# Patient Record
Sex: Male | Born: 1965
Health system: Southern US, Community
[De-identification: ages and names within clinical notes are randomized; demographics above are authoritative.]

## PROBLEM LIST (undated history)

## (undated) DIAGNOSIS — I2510 Atherosclerotic heart disease of native coronary artery without angina pectoris: Secondary | ICD-10-CM

## (undated) DIAGNOSIS — R0600 Dyspnea, unspecified: Secondary | ICD-10-CM

## (undated) DIAGNOSIS — R310 Gross hematuria: Secondary | ICD-10-CM

## (undated) DIAGNOSIS — I480 Paroxysmal atrial fibrillation: Secondary | ICD-10-CM

## (undated) DIAGNOSIS — I7101 Dissection of ascending aorta: Secondary | ICD-10-CM

## (undated) DIAGNOSIS — I10 Essential (primary) hypertension: Secondary | ICD-10-CM

## (undated) DIAGNOSIS — K8020 Calculus of gallbladder without cholecystitis without obstruction: Secondary | ICD-10-CM

## (undated) DIAGNOSIS — E559 Vitamin D deficiency, unspecified: Secondary | ICD-10-CM

## (undated) DIAGNOSIS — Z952 Presence of prosthetic heart valve: Secondary | ICD-10-CM

## (undated) DIAGNOSIS — E785 Hyperlipidemia, unspecified: Secondary | ICD-10-CM

## (undated) DIAGNOSIS — E669 Obesity, unspecified: Secondary | ICD-10-CM

## (undated) DIAGNOSIS — Z87442 Personal history of urinary calculi: Secondary | ICD-10-CM

## (undated) DIAGNOSIS — I639 Cerebral infarction, unspecified: Secondary | ICD-10-CM

## (undated) DIAGNOSIS — Z8679 Personal history of other diseases of the circulatory system: Secondary | ICD-10-CM

## (undated) DIAGNOSIS — I5032 Chronic diastolic (congestive) heart failure: Secondary | ICD-10-CM

## (undated) HISTORY — DX: Chronic diastolic (congestive) heart failure: I50.32

## (undated) HISTORY — DX: Personal history of urinary calculi: Z87.442

## (undated) HISTORY — DX: Personal history of other diseases of the circulatory system: Z86.79

## (undated) HISTORY — DX: Gross hematuria: R31.0

## (undated) HISTORY — DX: Vitamin D deficiency, unspecified: E55.9

## (undated) HISTORY — DX: Atherosclerotic heart disease of native coronary artery without angina pectoris: I25.10

## (undated) HISTORY — DX: Calculus of gallbladder without cholecystitis without obstruction: K80.20

---

## 1996-05-23 HISTORY — PX: APPENDECTOMY: SHX54

## 1998-05-23 HISTORY — PX: CORONARY ARTERY BYPASS GRAFT: SHX141

## 1998-05-23 HISTORY — PX: AORTIC VALVE REPLACEMENT: SHX41

## 2006-01-16 ENCOUNTER — Ambulatory Visit: Payer: Self-pay | Admitting: Internal Medicine

## 2006-01-18 ENCOUNTER — Ambulatory Visit: Payer: Self-pay | Admitting: Cardiology

## 2006-01-19 ENCOUNTER — Ambulatory Visit: Payer: Self-pay | Admitting: Internal Medicine

## 2006-01-27 ENCOUNTER — Ambulatory Visit: Payer: Self-pay | Admitting: Cardiology

## 2006-02-03 ENCOUNTER — Ambulatory Visit: Payer: Self-pay | Admitting: Cardiology

## 2006-02-09 ENCOUNTER — Ambulatory Visit: Payer: Self-pay | Admitting: Cardiology

## 2006-02-10 ENCOUNTER — Ambulatory Visit: Payer: Self-pay

## 2006-02-10 ENCOUNTER — Encounter: Payer: Self-pay | Admitting: Cardiology

## 2006-02-17 ENCOUNTER — Ambulatory Visit: Payer: Self-pay | Admitting: Internal Medicine

## 2006-02-23 ENCOUNTER — Ambulatory Visit: Payer: Self-pay | Admitting: Cardiology

## 2006-03-02 ENCOUNTER — Ambulatory Visit: Payer: Self-pay | Admitting: Cardiology

## 2006-03-23 ENCOUNTER — Ambulatory Visit: Payer: Self-pay | Admitting: Cardiology

## 2006-04-12 ENCOUNTER — Ambulatory Visit: Payer: Self-pay | Admitting: Cardiology

## 2006-05-12 ENCOUNTER — Ambulatory Visit: Payer: Self-pay | Admitting: Internal Medicine

## 2006-05-12 ENCOUNTER — Ambulatory Visit: Payer: Self-pay | Admitting: Cardiovascular Disease

## 2006-06-09 ENCOUNTER — Ambulatory Visit: Payer: Self-pay | Admitting: Internal Medicine

## 2006-06-15 ENCOUNTER — Ambulatory Visit: Payer: Self-pay | Admitting: Cardiology

## 2006-06-29 ENCOUNTER — Ambulatory Visit: Payer: Self-pay | Admitting: Cardiology

## 2006-06-29 LAB — CONVERTED CEMR LAB
INR: 5.5 (ref 0.9–2.0)
Prothrombin Time: 30.4 s (ref 10.0–14.0)

## 2006-07-07 ENCOUNTER — Ambulatory Visit: Payer: Self-pay | Admitting: Cardiology

## 2006-07-28 ENCOUNTER — Ambulatory Visit: Payer: Self-pay | Admitting: Cardiology

## 2006-07-28 LAB — CONVERTED CEMR LAB
INR: 4.8 — ABNORMAL HIGH (ref 0.9–2.0)
Prothrombin Time: 28.5 s — ABNORMAL HIGH (ref 10.0–14.0)

## 2006-08-14 ENCOUNTER — Ambulatory Visit: Payer: Self-pay | Admitting: Internal Medicine

## 2006-08-14 LAB — CONVERTED CEMR LAB
INR: 6.2 (ref 0.9–2.0)
Prothrombin Time: 32.3 s (ref 10.0–14.0)

## 2006-08-24 ENCOUNTER — Ambulatory Visit: Payer: Self-pay | Admitting: Cardiology

## 2006-08-31 ENCOUNTER — Ambulatory Visit: Payer: Self-pay | Admitting: Internal Medicine

## 2006-09-26 ENCOUNTER — Ambulatory Visit: Payer: Self-pay | Admitting: Internal Medicine

## 2006-10-03 ENCOUNTER — Ambulatory Visit: Payer: Self-pay | Admitting: Cardiology

## 2006-10-13 ENCOUNTER — Ambulatory Visit: Payer: Self-pay | Admitting: Internal Medicine

## 2006-11-03 ENCOUNTER — Ambulatory Visit: Payer: Self-pay | Admitting: Cardiology

## 2006-11-09 ENCOUNTER — Ambulatory Visit: Payer: Self-pay | Admitting: Internal Medicine

## 2006-11-21 ENCOUNTER — Ambulatory Visit: Payer: Self-pay | Admitting: Cardiology

## 2006-12-07 ENCOUNTER — Emergency Department (HOSPITAL_COMMUNITY): Admission: EM | Admit: 2006-12-07 | Discharge: 2006-12-07 | Payer: Self-pay | Admitting: Emergency Medicine

## 2006-12-07 IMAGING — CR DG CHEST 1V PORT
1 series · 1 of 1 positions shown · non-contrast
Comparison: None

CLINICAL DATA: Swelling of the left leg. Diabetes. CVA. Hypertension.

PORTABLE CHEST - 1 VIEW

[view not recorded]
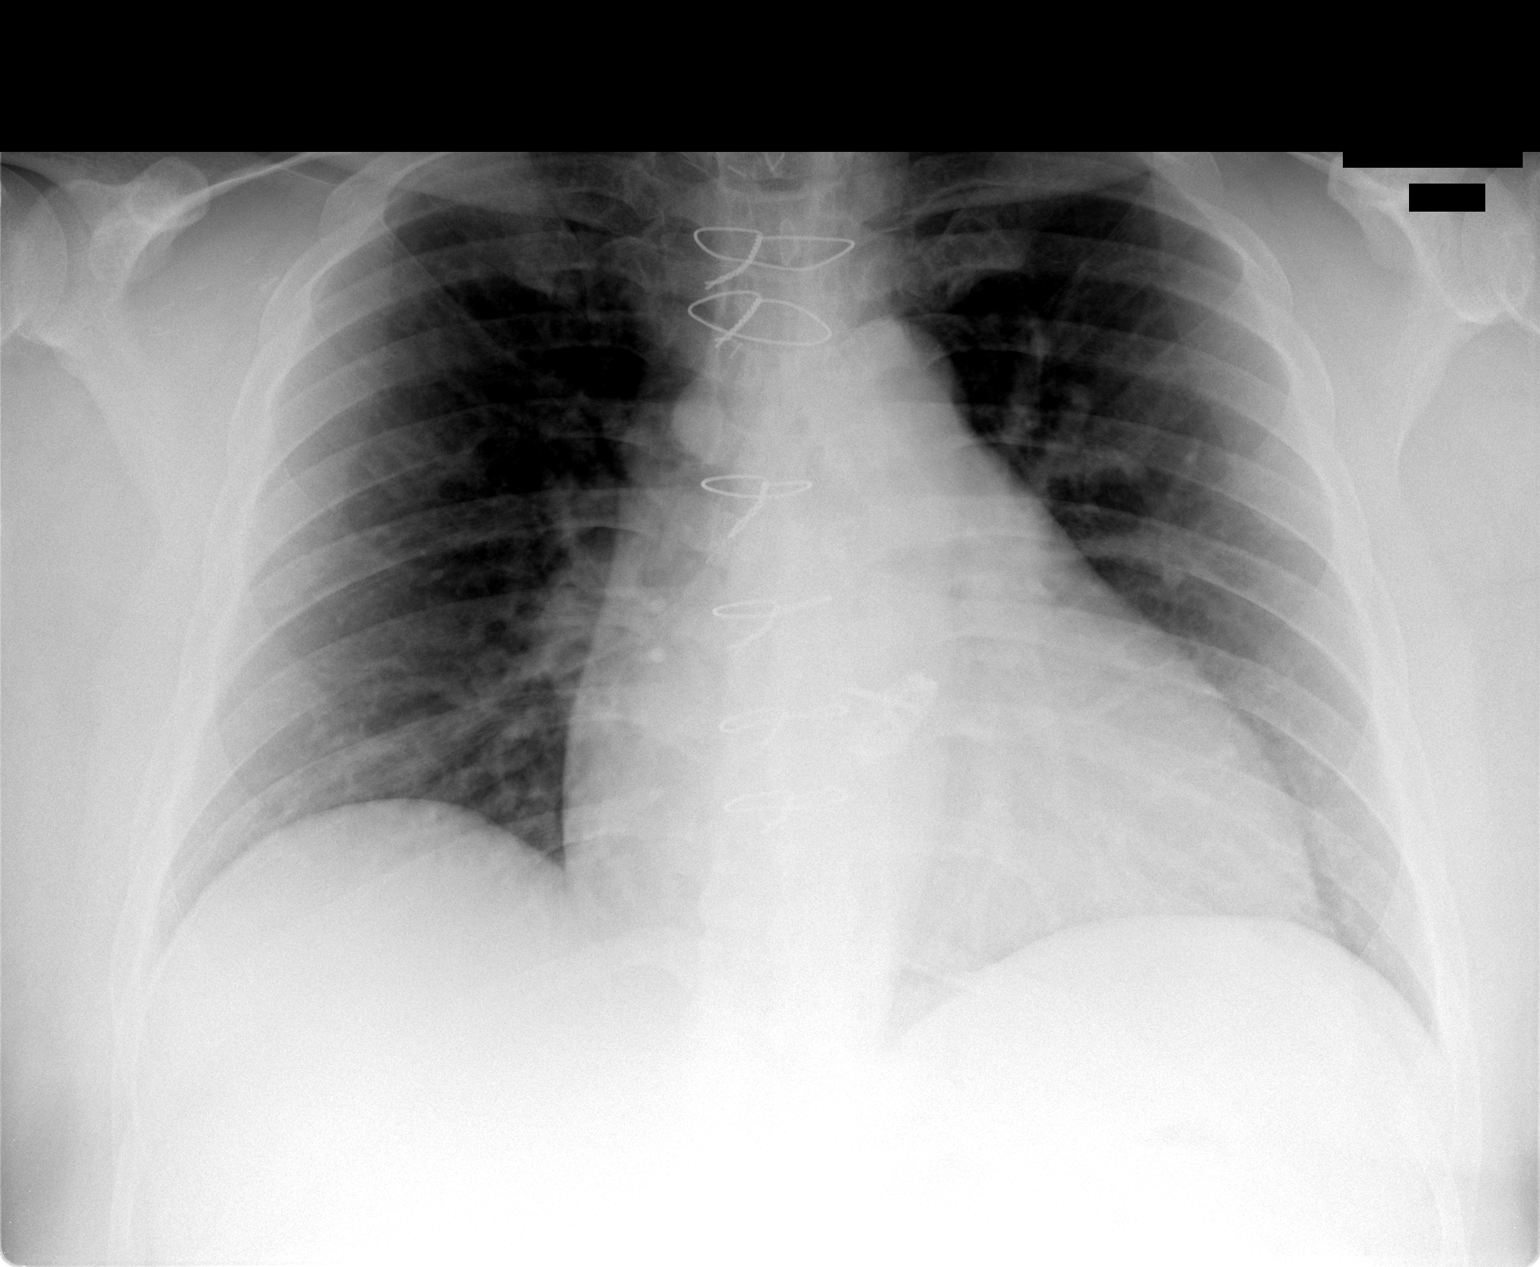

[1 of 1 positions shown; findings below may reference images not displayed]

FINDINGS: Mild cardiomegaly is present. Prior median sternotomy noted. Mild
prominence of upper zone vasculature raises the possibility of pulmonary venous
hypertension, but no overt pulmonary edema is identified. No airspace opacities
noted.  

IMPRESSION

Mild cardiomegaly and pulmonary venous hypertension, without overt edema.

## 2006-12-11 ENCOUNTER — Ambulatory Visit: Payer: Self-pay | Admitting: Cardiovascular Disease

## 2007-01-06 ENCOUNTER — Ambulatory Visit: Payer: Self-pay | Admitting: Internal Medicine

## 2007-01-10 ENCOUNTER — Inpatient Hospital Stay (HOSPITAL_COMMUNITY): Admission: EM | Admit: 2007-01-10 | Discharge: 2007-01-12 | Payer: Self-pay | Admitting: Emergency Medicine

## 2007-01-15 ENCOUNTER — Ambulatory Visit: Payer: Self-pay | Admitting: Internal Medicine

## 2007-01-15 LAB — CONVERTED CEMR LAB
BUN: 14 mg/dL (ref 6–23)
CO2: 30 meq/L (ref 19–32)
Calcium: 9.2 mg/dL (ref 8.4–10.5)
Chloride: 105 meq/L (ref 96–112)
Creatinine, Ser: 1 mg/dL (ref 0.4–1.5)
GFR calc Af Amer: 106 mL/min
GFR calc non Af Amer: 88 mL/min
Glucose, Bld: 232 mg/dL — ABNORMAL HIGH (ref 70–99)
Potassium: 4 meq/L (ref 3.5–5.1)
Sodium: 141 meq/L (ref 135–145)

## 2007-01-25 ENCOUNTER — Ambulatory Visit: Payer: Self-pay | Admitting: Cardiology

## 2007-02-01 ENCOUNTER — Ambulatory Visit: Payer: Self-pay | Admitting: Internal Medicine

## 2007-02-20 ENCOUNTER — Ambulatory Visit: Payer: Self-pay | Admitting: Internal Medicine

## 2007-02-20 LAB — CONVERTED CEMR LAB
ALT: 20 units/L (ref 0–53)
AST: 24 units/L (ref 0–37)
BUN: 16 mg/dL (ref 6–23)
CO2: 32 meq/L (ref 19–32)
Calcium: 9 mg/dL (ref 8.4–10.5)
Chloride: 104 meq/L (ref 96–112)
Creatinine, Ser: 1.1 mg/dL (ref 0.4–1.5)
Creatinine,U: 148.7 mg/dL
GFR calc Af Amer: 94 mL/min
GFR calc non Af Amer: 78 mL/min
Glucose, Bld: 97 mg/dL (ref 70–99)
Hgb A1c MFr Bld: 8.9 % — ABNORMAL HIGH (ref 4.6–6.0)
Microalb Creat Ratio: 8.1 mg/g (ref 0.0–30.0)
Microalb, Ur: 1.2 mg/dL (ref 0.0–1.9)
Potassium: 3.7 meq/L (ref 3.5–5.1)
Sodium: 140 meq/L (ref 135–145)

## 2007-02-22 ENCOUNTER — Ambulatory Visit: Payer: Self-pay | Admitting: Cardiology

## 2007-03-08 ENCOUNTER — Ambulatory Visit: Payer: Self-pay | Admitting: Cardiology

## 2007-03-26 ENCOUNTER — Ambulatory Visit: Payer: Self-pay | Admitting: Cardiovascular Disease

## 2007-03-26 ENCOUNTER — Encounter: Payer: Self-pay | Admitting: Internal Medicine

## 2007-04-23 ENCOUNTER — Ambulatory Visit: Payer: Self-pay | Admitting: Cardiology

## 2007-05-07 ENCOUNTER — Ambulatory Visit: Payer: Self-pay

## 2007-05-07 ENCOUNTER — Ambulatory Visit: Payer: Self-pay | Admitting: Internal Medicine

## 2007-05-07 ENCOUNTER — Encounter: Payer: Self-pay | Admitting: Internal Medicine

## 2007-05-21 DIAGNOSIS — Z87442 Personal history of urinary calculi: Secondary | ICD-10-CM | POA: Insufficient documentation

## 2007-05-21 DIAGNOSIS — I10 Essential (primary) hypertension: Secondary | ICD-10-CM | POA: Insufficient documentation

## 2007-05-21 DIAGNOSIS — G459 Transient cerebral ischemic attack, unspecified: Secondary | ICD-10-CM | POA: Insufficient documentation

## 2007-05-21 DIAGNOSIS — E119 Type 2 diabetes mellitus without complications: Secondary | ICD-10-CM | POA: Insufficient documentation

## 2007-05-22 ENCOUNTER — Ambulatory Visit: Payer: Self-pay | Admitting: Internal Medicine

## 2007-05-25 ENCOUNTER — Encounter (INDEPENDENT_AMBULATORY_CARE_PROVIDER_SITE_OTHER): Payer: Self-pay | Admitting: *Deleted

## 2007-05-25 LAB — CONVERTED CEMR LAB
BUN: 9 mg/dL (ref 6–23)
CO2: 29 meq/L (ref 19–32)
Calcium: 9.4 mg/dL (ref 8.4–10.5)
Chloride: 102 meq/L (ref 96–112)
Creatinine, Ser: 1 mg/dL (ref 0.4–1.5)
Creatinine,U: 183.8 mg/dL
GFR calc Af Amer: 105 mL/min
GFR calc non Af Amer: 87 mL/min
Glucose, Bld: 201 mg/dL — ABNORMAL HIGH (ref 70–99)
Hgb A1c MFr Bld: 6.1 % — ABNORMAL HIGH (ref 4.6–6.0)
Microalb Creat Ratio: 114.8 mg/g — ABNORMAL HIGH (ref 0.0–30.0)
Microalb, Ur: 21.1 mg/dL — ABNORMAL HIGH (ref 0.0–1.9)
Potassium: 3.3 meq/L — ABNORMAL LOW (ref 3.5–5.1)
Sodium: 140 meq/L (ref 135–145)

## 2007-06-04 ENCOUNTER — Ambulatory Visit: Payer: Self-pay | Admitting: Cardiology

## 2007-06-25 ENCOUNTER — Ambulatory Visit: Payer: Self-pay | Admitting: Internal Medicine

## 2007-07-03 ENCOUNTER — Encounter: Payer: Self-pay | Admitting: Internal Medicine

## 2007-07-09 ENCOUNTER — Ambulatory Visit: Payer: Self-pay | Admitting: Cardiovascular Disease

## 2007-07-27 ENCOUNTER — Ambulatory Visit: Payer: Self-pay | Admitting: Cardiology

## 2007-08-17 ENCOUNTER — Ambulatory Visit: Payer: Self-pay | Admitting: Internal Medicine

## 2007-08-20 ENCOUNTER — Ambulatory Visit: Payer: Self-pay | Admitting: Internal Medicine

## 2007-08-20 DIAGNOSIS — E785 Hyperlipidemia, unspecified: Secondary | ICD-10-CM | POA: Insufficient documentation

## 2007-08-20 LAB — CONVERTED CEMR LAB
ALT: 13 units/L (ref 0–53)
AST: 20 units/L (ref 0–37)
BUN: 12 mg/dL (ref 6–23)
CO2: 32 meq/L (ref 19–32)
Calcium: 9.2 mg/dL (ref 8.4–10.5)
Chloride: 104 meq/L (ref 96–112)
Cholesterol: 106 mg/dL (ref 0–200)
Creatinine, Ser: 1 mg/dL (ref 0.4–1.5)
Creatinine,U: 227.2 mg/dL
GFR calc Af Amer: 105 mL/min
GFR calc non Af Amer: 87 mL/min
Glucose, Bld: 134 mg/dL — ABNORMAL HIGH (ref 70–99)
HDL: 25.2 mg/dL — ABNORMAL LOW (ref >39.0)
Hgb A1c MFr Bld: 6.4 % — ABNORMAL HIGH (ref 4.6–6.0)
LDL Cholesterol: 53 mg/dL (ref 0–99)
Microalb Creat Ratio: 46.7 mg/g — ABNORMAL HIGH (ref 0.0–30.0)
Microalb, Ur: 10.6 mg/dL — ABNORMAL HIGH (ref 0.0–1.9)
Potassium: 3.8 meq/L (ref 3.5–5.1)
Sodium: 141 meq/L (ref 135–145)
Total CHOL/HDL Ratio: 4.2
Triglycerides: 139 mg/dL (ref 0–149)
VLDL: 28 mg/dL (ref 0–40)

## 2007-09-05 ENCOUNTER — Ambulatory Visit: Payer: Self-pay | Admitting: Internal Medicine

## 2007-09-24 ENCOUNTER — Encounter: Payer: Self-pay | Admitting: Internal Medicine

## 2007-10-03 ENCOUNTER — Ambulatory Visit: Payer: Self-pay | Admitting: Internal Medicine

## 2007-10-18 ENCOUNTER — Ambulatory Visit: Payer: Self-pay | Admitting: Cardiology

## 2007-11-15 ENCOUNTER — Ambulatory Visit: Payer: Self-pay | Admitting: Internal Medicine

## 2007-11-15 ENCOUNTER — Encounter: Payer: Self-pay | Admitting: Internal Medicine

## 2007-11-22 ENCOUNTER — Ambulatory Visit: Payer: Self-pay | Admitting: Internal Medicine

## 2007-12-13 ENCOUNTER — Ambulatory Visit: Payer: Self-pay | Admitting: Cardiology

## 2007-12-26 ENCOUNTER — Ambulatory Visit: Payer: Self-pay | Admitting: Internal Medicine

## 2007-12-26 DIAGNOSIS — R5381 Other malaise: Secondary | ICD-10-CM | POA: Insufficient documentation

## 2007-12-26 DIAGNOSIS — R5383 Other fatigue: Secondary | ICD-10-CM

## 2007-12-27 LAB — CONVERTED CEMR LAB
ALT: 18 units/L (ref 0–53)
AST: 22 units/L (ref 0–37)
Albumin: 4 g/dL (ref 3.5–5.2)
Alkaline Phosphatase: 60 units/L (ref 39–117)
BUN: 14 mg/dL (ref 6–23)
Basophils Absolute: 0 10*3/uL (ref 0.0–0.1)
Basophils Relative: 0.4 % (ref 0.0–3.0)
Bilirubin, Direct: 0.2 mg/dL (ref 0.0–0.3)
CO2: 30 meq/L (ref 19–32)
Calcium: 9.6 mg/dL (ref 8.4–10.5)
Chloride: 102 meq/L (ref 96–112)
Cholesterol: 123 mg/dL (ref 0–200)
Creatinine, Ser: 1.1 mg/dL (ref 0.4–1.5)
Creatinine,U: 230.5 mg/dL
Eosinophils Absolute: 0.3 10*3/uL (ref 0.0–0.7)
Eosinophils Relative: 6.1 % — ABNORMAL HIGH (ref 0.0–5.0)
GFR calc Af Amer: 94 mL/min
GFR calc non Af Amer: 78 mL/min
Glucose, Bld: 208 mg/dL — ABNORMAL HIGH (ref 70–99)
HCT: 46.1 % (ref 39.0–52.0)
HDL: 29.4 mg/dL — ABNORMAL LOW (ref >39.0)
Hemoglobin: 15.8 g/dL (ref 13.0–17.0)
Hgb A1c MFr Bld: 8 % — ABNORMAL HIGH (ref 4.6–6.0)
LDL Cholesterol: 61 mg/dL (ref 0–99)
Lymphocytes Relative: 24.5 % (ref 12.0–46.0)
MCHC: 34.3 g/dL (ref 30.0–36.0)
MCV: 84.3 fL (ref 78.0–100.0)
Microalb, Ur: 139.4 mg/dL — ABNORMAL HIGH (ref 0.0–1.9)
Monocytes Absolute: 0.6 10*3/uL (ref 0.1–1.0)
Monocytes Relative: 10.7 % (ref 3.0–12.0)
Neutro Abs: 3.4 10*3/uL (ref 1.4–7.7)
Neutrophils Relative %: 58.3 % (ref 43.0–77.0)
PSA: 0.58 ng/mL (ref 0.10–4.00)
Platelets: 152 10*3/uL (ref 150–400)
Potassium: 3.8 meq/L (ref 3.5–5.1)
RBC: 5.46 M/uL (ref 4.22–5.81)
RDW: 14.4 % (ref 11.5–14.6)
Sodium: 139 meq/L (ref 135–145)
TSH: 4.08 microintl units/mL (ref 0.35–5.50)
Total Bilirubin: 1.3 mg/dL — ABNORMAL HIGH (ref 0.3–1.2)
Total CHOL/HDL Ratio: 4.2
Total Protein: 8.5 g/dL — ABNORMAL HIGH (ref 6.0–8.3)
Triglycerides: 165 mg/dL — ABNORMAL HIGH (ref 0–149)
VLDL: 33 mg/dL (ref 0–40)
WBC: 5.7 10*3/uL (ref 4.5–10.5)

## 2007-12-28 LAB — CONVERTED CEMR LAB: Vit D, 1,25-Dihydroxy: 23 — ABNORMAL LOW (ref 30–89)

## 2008-01-10 ENCOUNTER — Ambulatory Visit: Payer: Self-pay | Admitting: Cardiology

## 2008-02-07 ENCOUNTER — Ambulatory Visit: Payer: Self-pay | Admitting: Cardiology

## 2008-03-06 ENCOUNTER — Ambulatory Visit: Payer: Self-pay | Admitting: Cardiology

## 2008-04-03 ENCOUNTER — Ambulatory Visit: Payer: Self-pay | Admitting: Cardiology

## 2008-05-02 ENCOUNTER — Ambulatory Visit: Payer: Self-pay | Admitting: Cardiology

## 2008-05-27 ENCOUNTER — Ambulatory Visit: Payer: Self-pay

## 2008-05-27 ENCOUNTER — Encounter: Payer: Self-pay | Admitting: Cardiovascular Disease

## 2008-05-27 ENCOUNTER — Ambulatory Visit: Payer: Self-pay | Admitting: Internal Medicine

## 2008-06-17 ENCOUNTER — Ambulatory Visit: Payer: Self-pay | Admitting: Cardiology

## 2008-06-30 ENCOUNTER — Encounter: Payer: Self-pay | Admitting: Internal Medicine

## 2008-07-03 ENCOUNTER — Ambulatory Visit: Payer: Self-pay | Admitting: Cardiovascular Disease

## 2008-07-04 ENCOUNTER — Emergency Department (HOSPITAL_COMMUNITY): Admission: EM | Admit: 2008-07-04 | Discharge: 2008-07-04 | Payer: Self-pay | Admitting: Emergency Medicine

## 2008-07-05 ENCOUNTER — Emergency Department (HOSPITAL_COMMUNITY): Admission: EM | Admit: 2008-07-05 | Discharge: 2008-07-05 | Payer: Self-pay | Admitting: Emergency Medicine

## 2008-07-08 ENCOUNTER — Ambulatory Visit: Payer: Self-pay | Admitting: Cardiology

## 2008-07-14 ENCOUNTER — Ambulatory Visit: Payer: Self-pay | Admitting: Cardiology

## 2008-07-28 ENCOUNTER — Ambulatory Visit: Payer: Self-pay | Admitting: Cardiology

## 2008-08-07 ENCOUNTER — Ambulatory Visit: Payer: Self-pay | Admitting: Internal Medicine

## 2008-08-07 DIAGNOSIS — R310 Gross hematuria: Secondary | ICD-10-CM | POA: Insufficient documentation

## 2008-08-07 DIAGNOSIS — E559 Vitamin D deficiency, unspecified: Secondary | ICD-10-CM | POA: Insufficient documentation

## 2008-08-08 LAB — CONVERTED CEMR LAB
BUN: 14 mg/dL (ref 6–23)
Bilirubin Urine: NEGATIVE
CO2: 32 meq/L (ref 19–32)
Calcium: 9.1 mg/dL (ref 8.4–10.5)
Chloride: 101 meq/L (ref 96–112)
Cholesterol: 117 mg/dL (ref 0–200)
Creatinine, Ser: 0.9 mg/dL (ref 0.4–1.5)
GFR calc non Af Amer: 118.17 mL/min (ref >60)
Glucose, Bld: 140 mg/dL — ABNORMAL HIGH (ref 70–99)
HDL: 30.5 mg/dL — ABNORMAL LOW (ref >39.00)
Hgb A1c MFr Bld: 7.1 % — ABNORMAL HIGH (ref 4.6–6.5)
Ketones, ur: NEGATIVE mg/dL
LDL Cholesterol: 62 mg/dL (ref 0–99)
Leukocytes, UA: NEGATIVE
Nitrite: NEGATIVE
Potassium: 3.4 meq/L — ABNORMAL LOW (ref 3.5–5.1)
Sodium: 139 meq/L (ref 135–145)
Specific Gravity, Urine: >=1.03 (ref 1.000–1.030)
Total CHOL/HDL Ratio: 4
Total Protein, Urine: >=300 mg/dL
Triglycerides: 122 mg/dL (ref 0.0–149.0)
Urine Glucose: NEGATIVE mg/dL
Urobilinogen, UA: 1 (ref 0.0–1.0)
VLDL: 24.4 mg/dL (ref 0.0–40.0)
pH: 5.5 (ref 5.0–8.0)

## 2008-08-13 ENCOUNTER — Ambulatory Visit: Payer: Self-pay | Admitting: Internal Medicine

## 2008-08-13 ENCOUNTER — Telehealth (INDEPENDENT_AMBULATORY_CARE_PROVIDER_SITE_OTHER): Payer: Self-pay | Admitting: *Deleted

## 2008-08-13 IMAGING — CT CT ABDOMEN W/O CM
2 of 4 series · 17 of 46 positions shown, 19 images · non-contrast
Comparison: None available.

CT ABDOMEN

CLINICAL DATA: Gross hematuria.  Low back pain.  History of
stones.

CT ABDOMEN AND PELVIS WITHOUT CONTRAST
TECHNIQUE: Multidetector CT imaging of the abdomen and pelvis was
performed following the standard protocol without intravenous
contrast.

[Series 2: xl stone wo · axial · 0.98mm/px · z∈[-507,-39]mm · 14 of 129 slices shown, 16 images]
[im 6/129  soft-tissue]
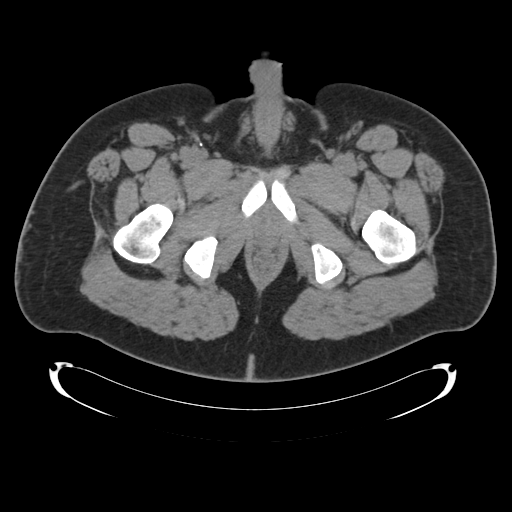
[im 6/129  bone]
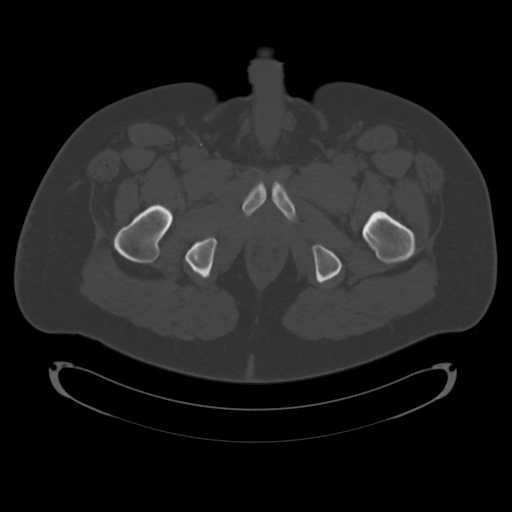
[im 17/129  soft-tissue]
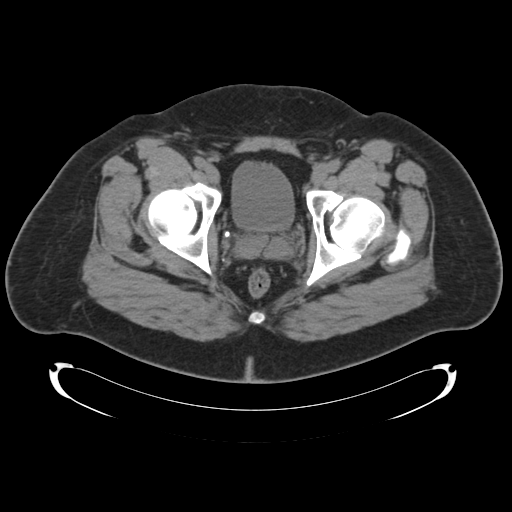
[im 27/129  soft-tissue]
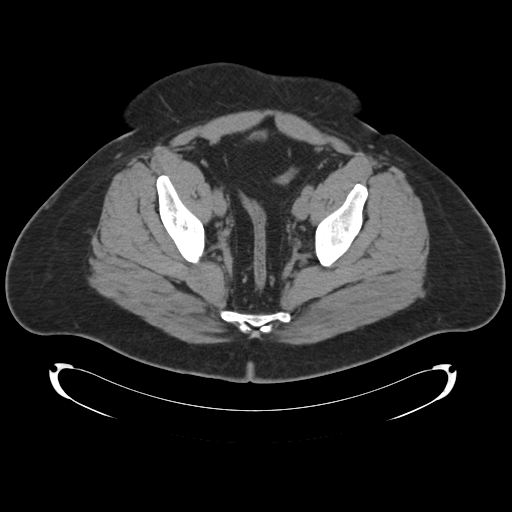
[im 33/129  soft-tissue]
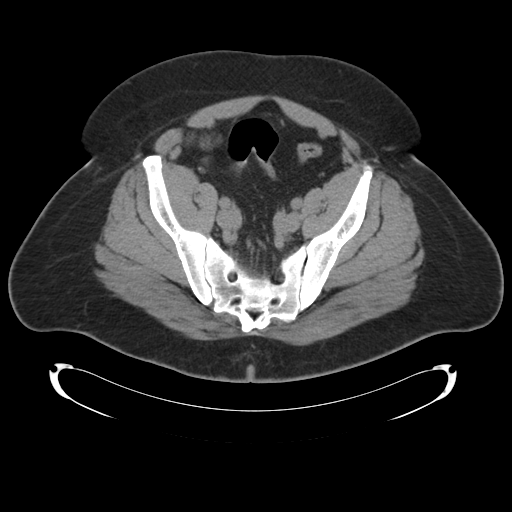
[im 43/129  soft-tissue]
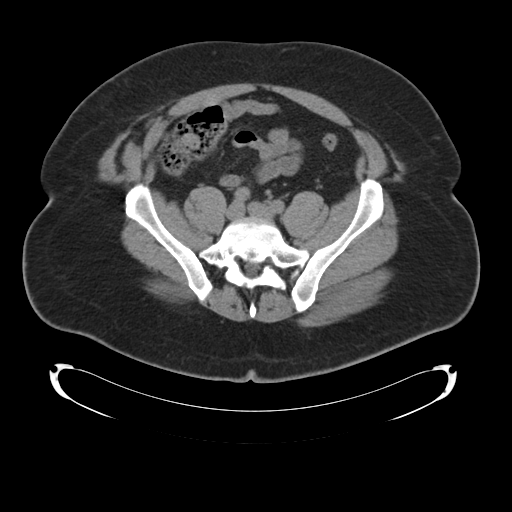
[im 54/129  soft-tissue]
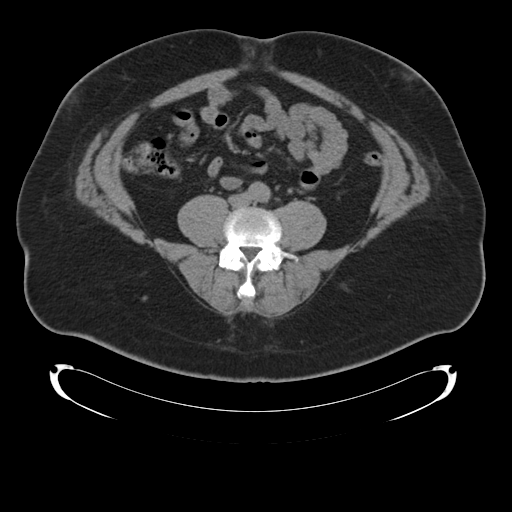
[im 59/129  soft-tissue]
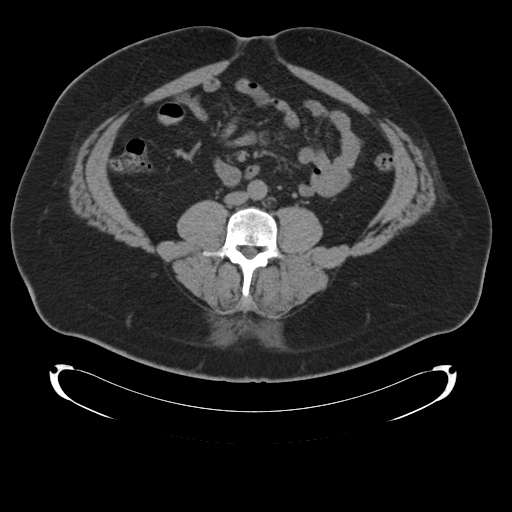
[im 70/129  soft-tissue]
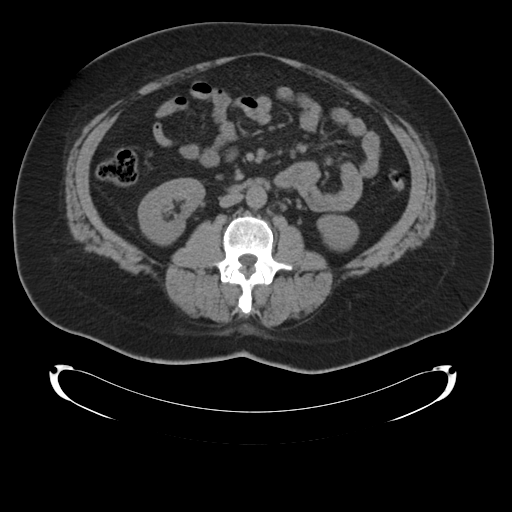
[im 75/129  soft-tissue]
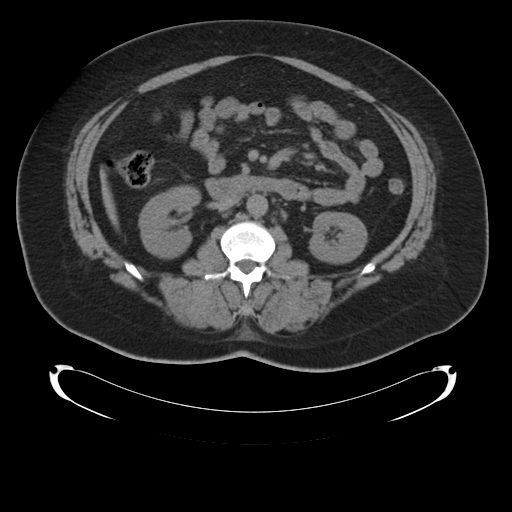
[im 75/129  bone]
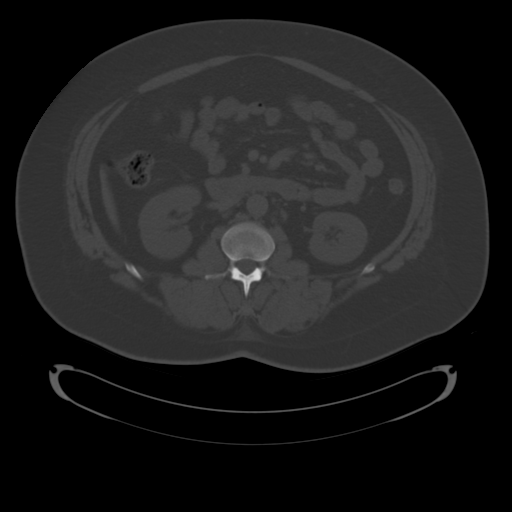
[im 86/129  soft-tissue]
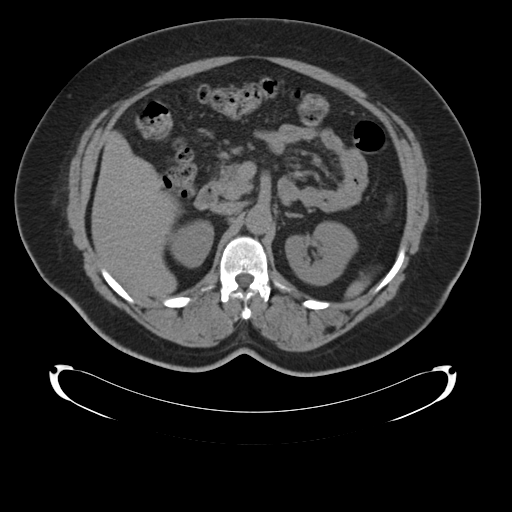
[im 97/129  soft-tissue]
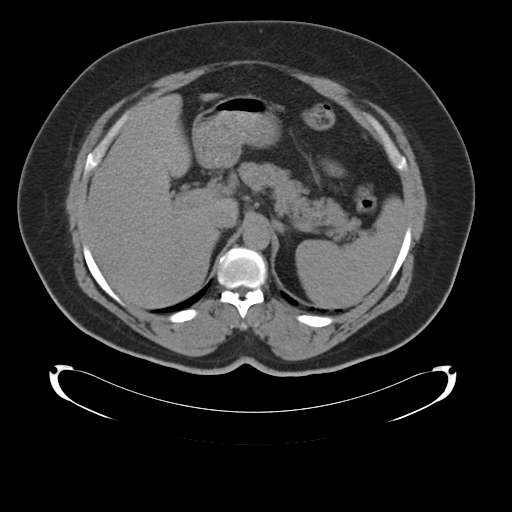
[im 102/129  soft-tissue]
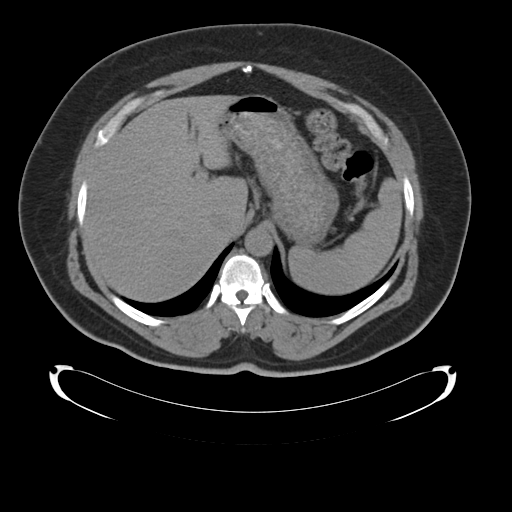
[im 113/129  soft-tissue]
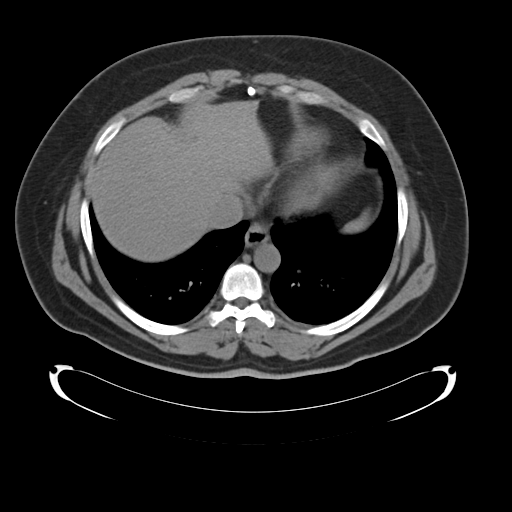
[im 123/129  soft-tissue]
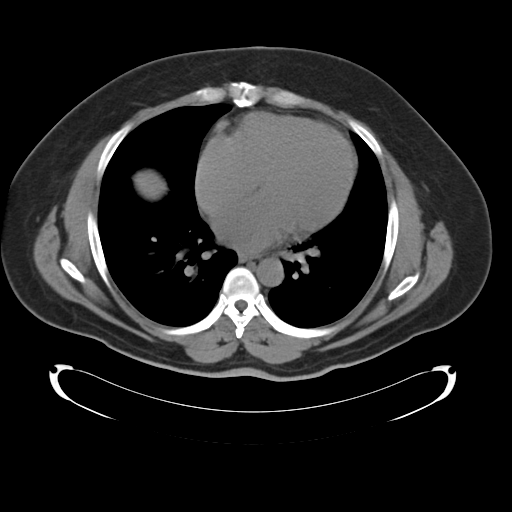

[Series 602: <mpr range> · coronal · 1.05mm/px · 3 of 170 slices shown]
[im 57/170  soft-tissue]
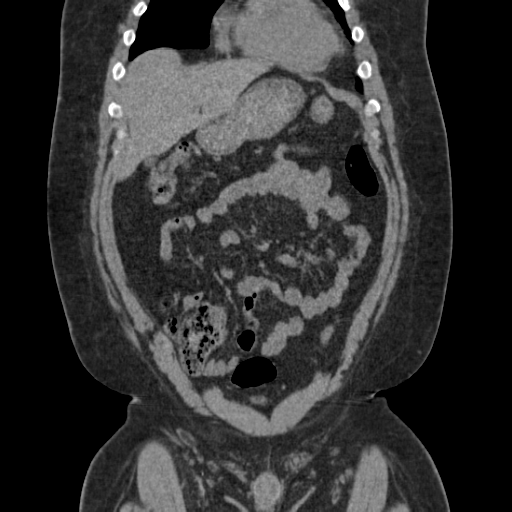
[im 76/170  soft-tissue]
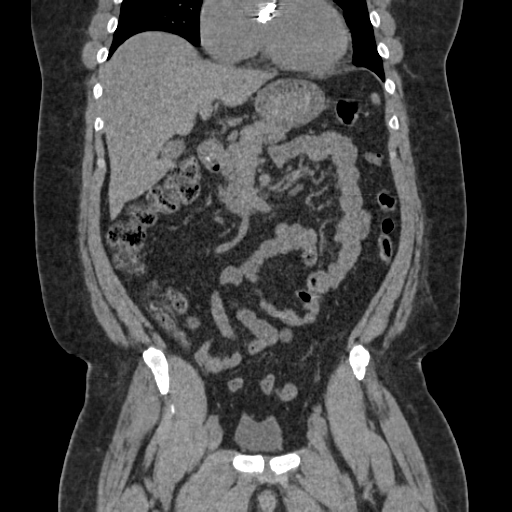
[im 94/170  soft-tissue]
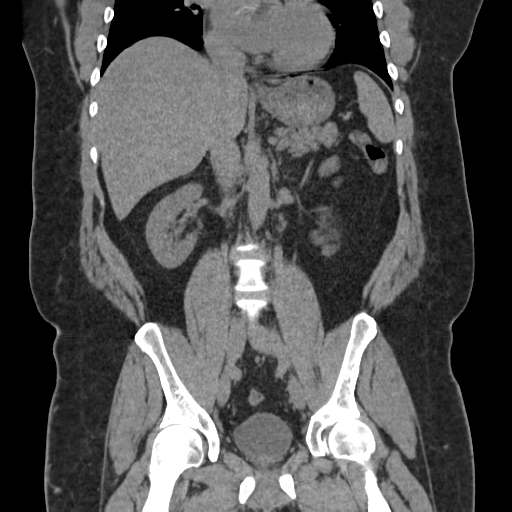

[17 of 46 positions shown; findings below may reference images not displayed]

FINDINGS: Note is made of a prosthetic aortic valve.  The heart is
mildly enlarged.  There is no significant pleural or pericardial
effusion.  The lung bases are clear.

The spleen and liver are unremarkable.  The stomach, pancreas,
common bile duct and gallbladder are normal.  The adrenal glands
are within normal limits.  There is no evidence for
nephrolithiasis.  There is no hydronephrosis.  There is no
significant abdominal lymphadenopathy or free fluid.  Small bowel
is unremarkable. Bone windows are unremarkable.
IMPRESSION: 1.  No acute abnormality of the abdomen.
2.  No evidence for nephrolithiasis.
3.  Status post aortic valve replacement.
4.  Borderline cardiomegaly.

CT PELVIS
FINDINGS: The rectosigmoid colon is within normal limits.  The
remainder the colon is unremarkable.  The appendix is not clearly
visualized.  The terminal ileum is unremarkable.  The urinary
bladder is within normal limits.  Prostate gland is normal.  Bone
windows are unremarkable.
IMPRESSION: No acute abnormality of the pelvis.

## 2008-08-19 ENCOUNTER — Encounter: Payer: Self-pay | Admitting: Internal Medicine

## 2008-08-19 ENCOUNTER — Encounter: Admission: RE | Admit: 2008-08-19 | Discharge: 2008-08-19 | Payer: Self-pay | Admitting: Internal Medicine

## 2008-08-27 ENCOUNTER — Ambulatory Visit: Payer: Self-pay | Admitting: Cardiology

## 2008-08-27 LAB — CONVERTED CEMR LAB
ALT: 16 units/L (ref 0–53)
AST: 23 units/L (ref 0–37)
Albumin: 3.1 g/dL — ABNORMAL LOW (ref 3.5–5.2)
Alkaline Phosphatase: 45 units/L (ref 39–117)
Bilirubin, Direct: 0.2 mg/dL (ref 0.0–0.3)
Total Bilirubin: 1 mg/dL (ref 0.3–1.2)
Total Protein: 7.6 g/dL (ref 6.0–8.3)

## 2008-08-29 ENCOUNTER — Encounter: Payer: Self-pay | Admitting: Internal Medicine

## 2008-09-10 ENCOUNTER — Ambulatory Visit: Payer: Self-pay | Admitting: Internal Medicine

## 2008-09-25 ENCOUNTER — Ambulatory Visit: Payer: Self-pay | Admitting: Internal Medicine

## 2008-10-21 ENCOUNTER — Encounter: Payer: Self-pay | Admitting: *Deleted

## 2008-10-23 ENCOUNTER — Ambulatory Visit: Payer: Self-pay | Admitting: Internal Medicine

## 2008-10-23 LAB — CONVERTED CEMR LAB
POC INR: 1.6
Protime: 15.6

## 2008-11-06 ENCOUNTER — Ambulatory Visit: Payer: Self-pay | Admitting: Internal Medicine

## 2008-11-06 LAB — CONVERTED CEMR LAB
POC INR: 1.7
Protime: 15.9

## 2008-11-20 ENCOUNTER — Ambulatory Visit: Payer: Self-pay | Admitting: Cardiology

## 2008-11-20 LAB — CONVERTED CEMR LAB
POC INR: 2
Prothrombin Time: 17.5 s

## 2008-11-26 ENCOUNTER — Encounter: Payer: Self-pay | Admitting: *Deleted

## 2008-11-27 ENCOUNTER — Telehealth: Payer: Self-pay | Admitting: Internal Medicine

## 2008-12-04 ENCOUNTER — Ambulatory Visit: Payer: Self-pay | Admitting: Cardiology

## 2009-01-01 ENCOUNTER — Ambulatory Visit: Payer: Self-pay | Admitting: Internal Medicine

## 2009-01-01 ENCOUNTER — Encounter: Payer: Self-pay | Admitting: Internal Medicine

## 2009-01-01 LAB — CONVERTED CEMR LAB: POC INR: 2.7

## 2009-01-29 ENCOUNTER — Ambulatory Visit: Payer: Self-pay | Admitting: Cardiovascular Disease

## 2009-01-29 LAB — CONVERTED CEMR LAB: POC INR: 3.2

## 2009-02-10 ENCOUNTER — Telehealth: Payer: Self-pay | Admitting: Internal Medicine

## 2009-02-19 ENCOUNTER — Ambulatory Visit: Payer: Self-pay | Admitting: Cardiology

## 2009-03-12 ENCOUNTER — Ambulatory Visit: Payer: Self-pay | Admitting: Cardiovascular Disease

## 2009-03-12 LAB — CONVERTED CEMR LAB: POC INR: 2.8

## 2009-03-25 ENCOUNTER — Ambulatory Visit: Payer: Self-pay | Admitting: Internal Medicine

## 2009-03-25 LAB — CONVERTED CEMR LAB
BUN: 15 mg/dL (ref 6–23)
Cholesterol: 125 mg/dL (ref 0–200)
GFR calc non Af Amer: 93.47 mL/min (ref >60)
HDL: 31.2 mg/dL — ABNORMAL LOW (ref >39.00)
Potassium: 3.5 meq/L (ref 3.5–5.1)
Sodium: 141 meq/L (ref 135–145)
Triglycerides: 142 mg/dL (ref 0.0–149.0)
VLDL: 28.4 mg/dL (ref 0.0–40.0)

## 2009-04-09 ENCOUNTER — Ambulatory Visit: Payer: Self-pay | Admitting: Cardiology

## 2009-04-09 LAB — CONVERTED CEMR LAB: POC INR: 4.2

## 2009-04-21 ENCOUNTER — Ambulatory Visit: Payer: Self-pay | Admitting: Cardiology

## 2009-05-05 ENCOUNTER — Ambulatory Visit: Payer: Self-pay | Admitting: Cardiology

## 2009-05-05 LAB — CONVERTED CEMR LAB: INR: 2.5

## 2009-06-04 ENCOUNTER — Ambulatory Visit: Payer: Self-pay | Admitting: Internal Medicine

## 2009-06-04 LAB — CONVERTED CEMR LAB: POC INR: 2.7

## 2009-07-02 ENCOUNTER — Ambulatory Visit: Payer: Self-pay | Admitting: Cardiology

## 2009-07-02 LAB — CONVERTED CEMR LAB: POC INR: 3.1

## 2009-07-30 ENCOUNTER — Ambulatory Visit: Payer: Self-pay | Admitting: Cardiology

## 2009-08-27 ENCOUNTER — Ambulatory Visit: Payer: Self-pay | Admitting: Cardiovascular Disease

## 2009-08-27 LAB — CONVERTED CEMR LAB: POC INR: 3.2

## 2009-09-17 ENCOUNTER — Ambulatory Visit: Payer: Self-pay | Admitting: Cardiology

## 2009-09-30 ENCOUNTER — Ambulatory Visit: Payer: Self-pay | Admitting: Internal Medicine

## 2009-09-30 LAB — CONVERTED CEMR LAB
AST: 30 units/L (ref 0–37)
Albumin: 3.6 g/dL (ref 3.5–5.2)
Basophils Absolute: 0 10*3/uL (ref 0.0–0.1)
Basophils Relative: 0.4 % (ref 0.0–3.0)
CO2: 28 meq/L (ref 19–32)
Chloride: 106 meq/L (ref 96–112)
Cholesterol: 146 mg/dL (ref 0–200)
Eosinophils Absolute: 0.5 10*3/uL (ref 0.0–0.7)
Glucose, Bld: 126 mg/dL — ABNORMAL HIGH (ref 70–99)
HCT: 39.5 % (ref 39.0–52.0)
HDL: 37.8 mg/dL — ABNORMAL LOW (ref >39.00)
Hemoglobin: 13.6 g/dL (ref 13.0–17.0)
Hgb A1c MFr Bld: 7.2 % — ABNORMAL HIGH (ref 4.6–6.5)
Iron: 53 ug/dL (ref 42–165)
Ketones, ur: NEGATIVE mg/dL
Leukocytes, UA: NEGATIVE
Lymphs Abs: 1.3 10*3/uL (ref 0.7–4.0)
MCHC: 34.5 g/dL (ref 30.0–36.0)
MCV: 85.1 fL (ref 78.0–100.0)
Microalb Creat Ratio: 5.7 mg/g (ref 0.0–30.0)
Neutro Abs: 3.3 10*3/uL (ref 1.4–7.7)
PSA: 0.74 ng/mL (ref 0.10–4.00)
Potassium: 3.7 meq/L (ref 3.5–5.1)
RDW: 17.2 % — ABNORMAL HIGH (ref 11.5–14.6)
Saturation Ratios: 17 % — ABNORMAL LOW (ref 20.0–50.0)
Sodium: 141 meq/L (ref 135–145)
Specific Gravity, Urine: 1.025 (ref 1.000–1.030)
Total Protein: 7.3 g/dL (ref 6.0–8.3)
Transferrin: 222.3 mg/dL (ref 212.0–360.0)
Urobilinogen, UA: 1 (ref 0.0–1.0)
VLDL: 20.2 mg/dL (ref 0.0–40.0)
pH: 5.5 (ref 5.0–8.0)

## 2009-10-14 ENCOUNTER — Ambulatory Visit: Payer: Self-pay | Admitting: Internal Medicine

## 2009-10-14 LAB — CONVERTED CEMR LAB: POC INR: 2.4

## 2009-10-16 ENCOUNTER — Ambulatory Visit: Payer: Self-pay | Admitting: Internal Medicine

## 2009-10-16 ENCOUNTER — Encounter: Payer: Self-pay | Admitting: Internal Medicine

## 2009-10-16 ENCOUNTER — Ambulatory Visit (HOSPITAL_COMMUNITY): Admission: RE | Admit: 2009-10-16 | Discharge: 2009-10-16 | Payer: Self-pay | Admitting: Internal Medicine

## 2009-10-16 ENCOUNTER — Ambulatory Visit: Payer: Self-pay

## 2009-11-11 ENCOUNTER — Ambulatory Visit: Payer: Self-pay | Admitting: Cardiovascular Disease

## 2009-11-11 LAB — CONVERTED CEMR LAB: POC INR: 1.4

## 2009-11-26 ENCOUNTER — Ambulatory Visit: Payer: Self-pay | Admitting: Internal Medicine

## 2009-11-26 LAB — CONVERTED CEMR LAB: POC INR: 1.8

## 2009-12-10 ENCOUNTER — Ambulatory Visit: Payer: Self-pay | Admitting: Cardiology

## 2009-12-10 LAB — CONVERTED CEMR LAB: POC INR: 2.3

## 2009-12-17 ENCOUNTER — Ambulatory Visit: Payer: Self-pay | Admitting: Internal Medicine

## 2009-12-31 ENCOUNTER — Ambulatory Visit: Payer: Self-pay | Admitting: Internal Medicine

## 2009-12-31 ENCOUNTER — Inpatient Hospital Stay (HOSPITAL_COMMUNITY): Admission: EM | Admit: 2009-12-31 | Discharge: 2010-01-01 | Payer: Self-pay | Admitting: Emergency Medicine

## 2009-12-31 IMAGING — CR DG CHEST 2V
2 series · 2 of 2 positions shown · non-contrast
Comparison: 12/07/2006.

CLINICAL DATA: Chest pain for 1 day.  Diabetes.  Hypertension.
Left-sided pain.  Shortness of breath.

CHEST - 2 VIEW

[w chest pa *]
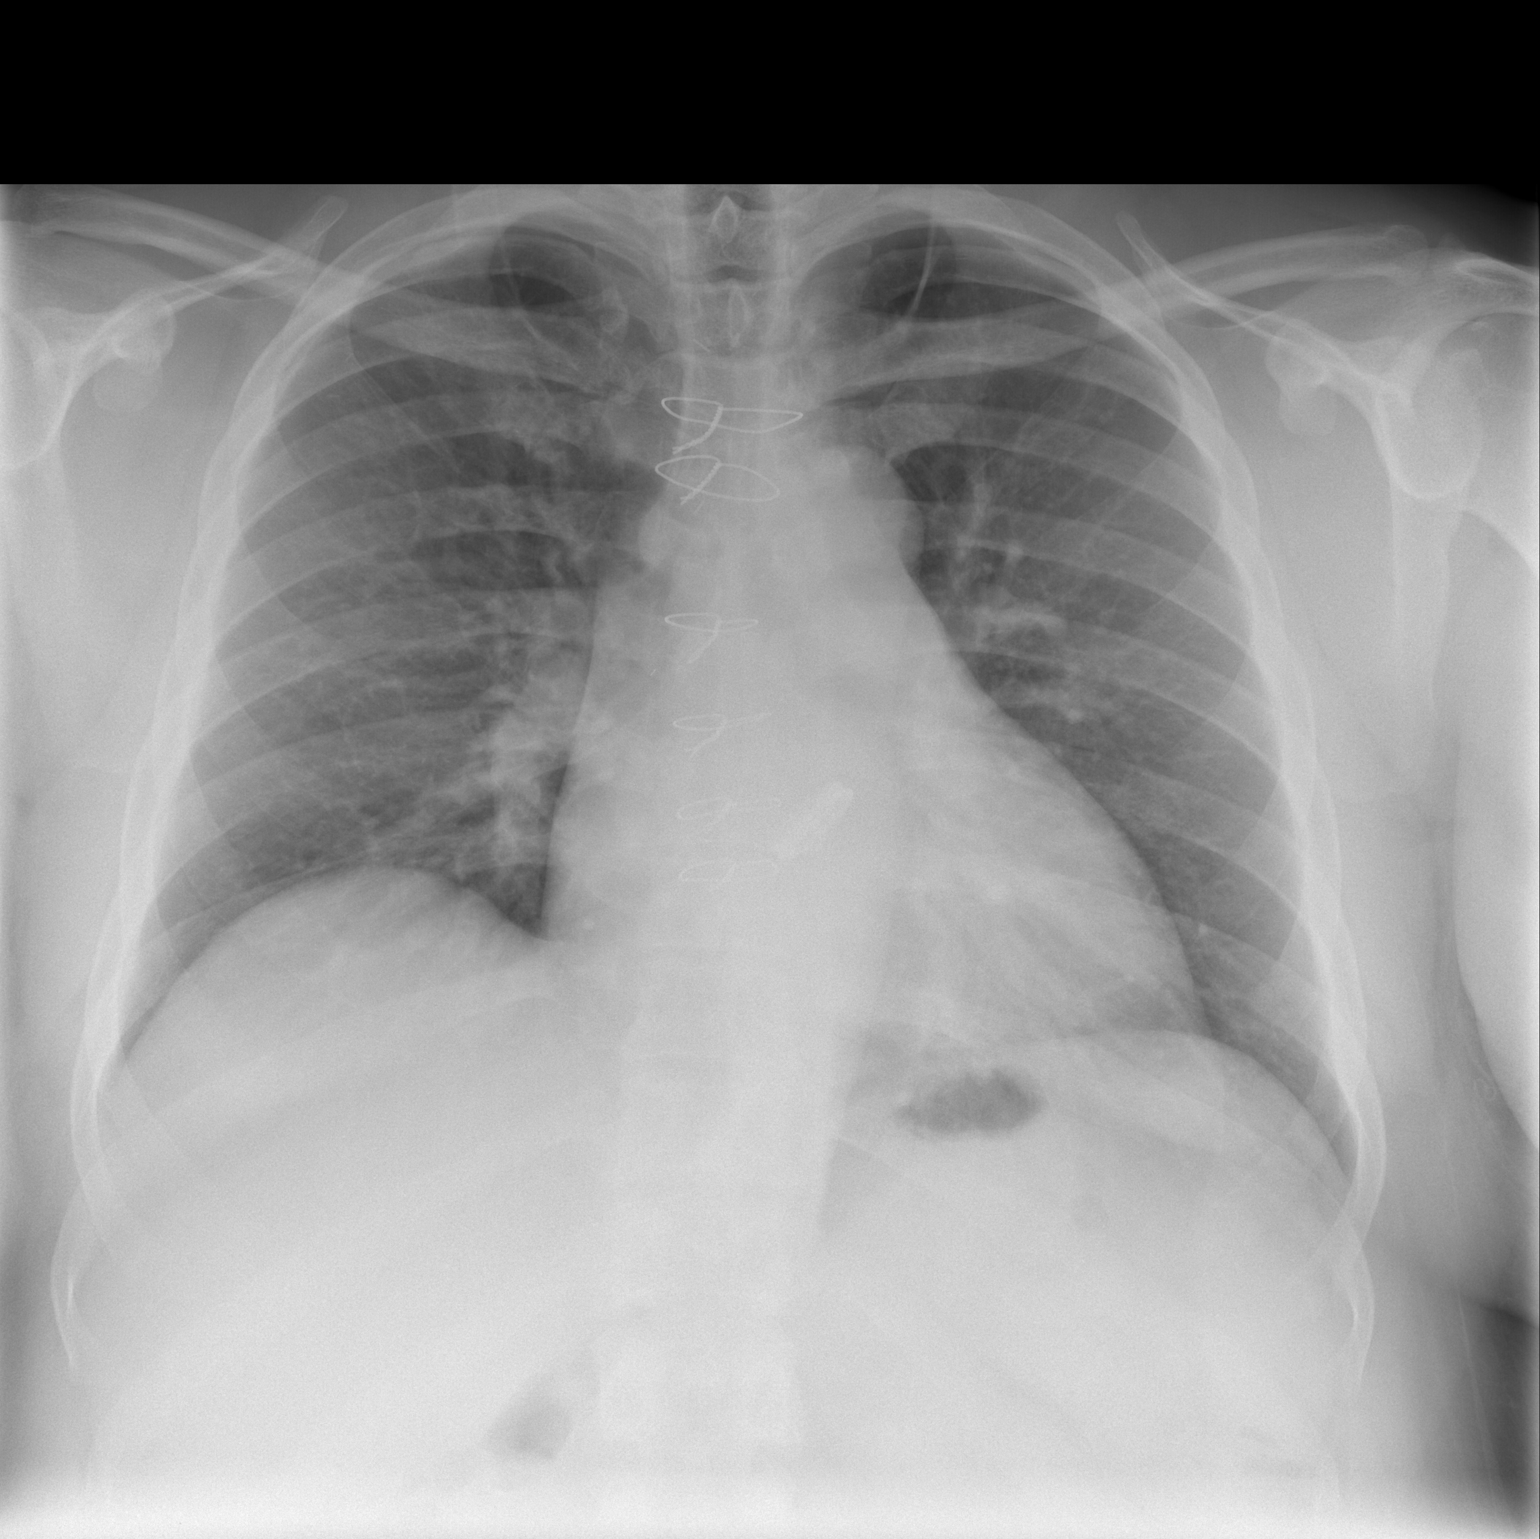

[w chest lat *]
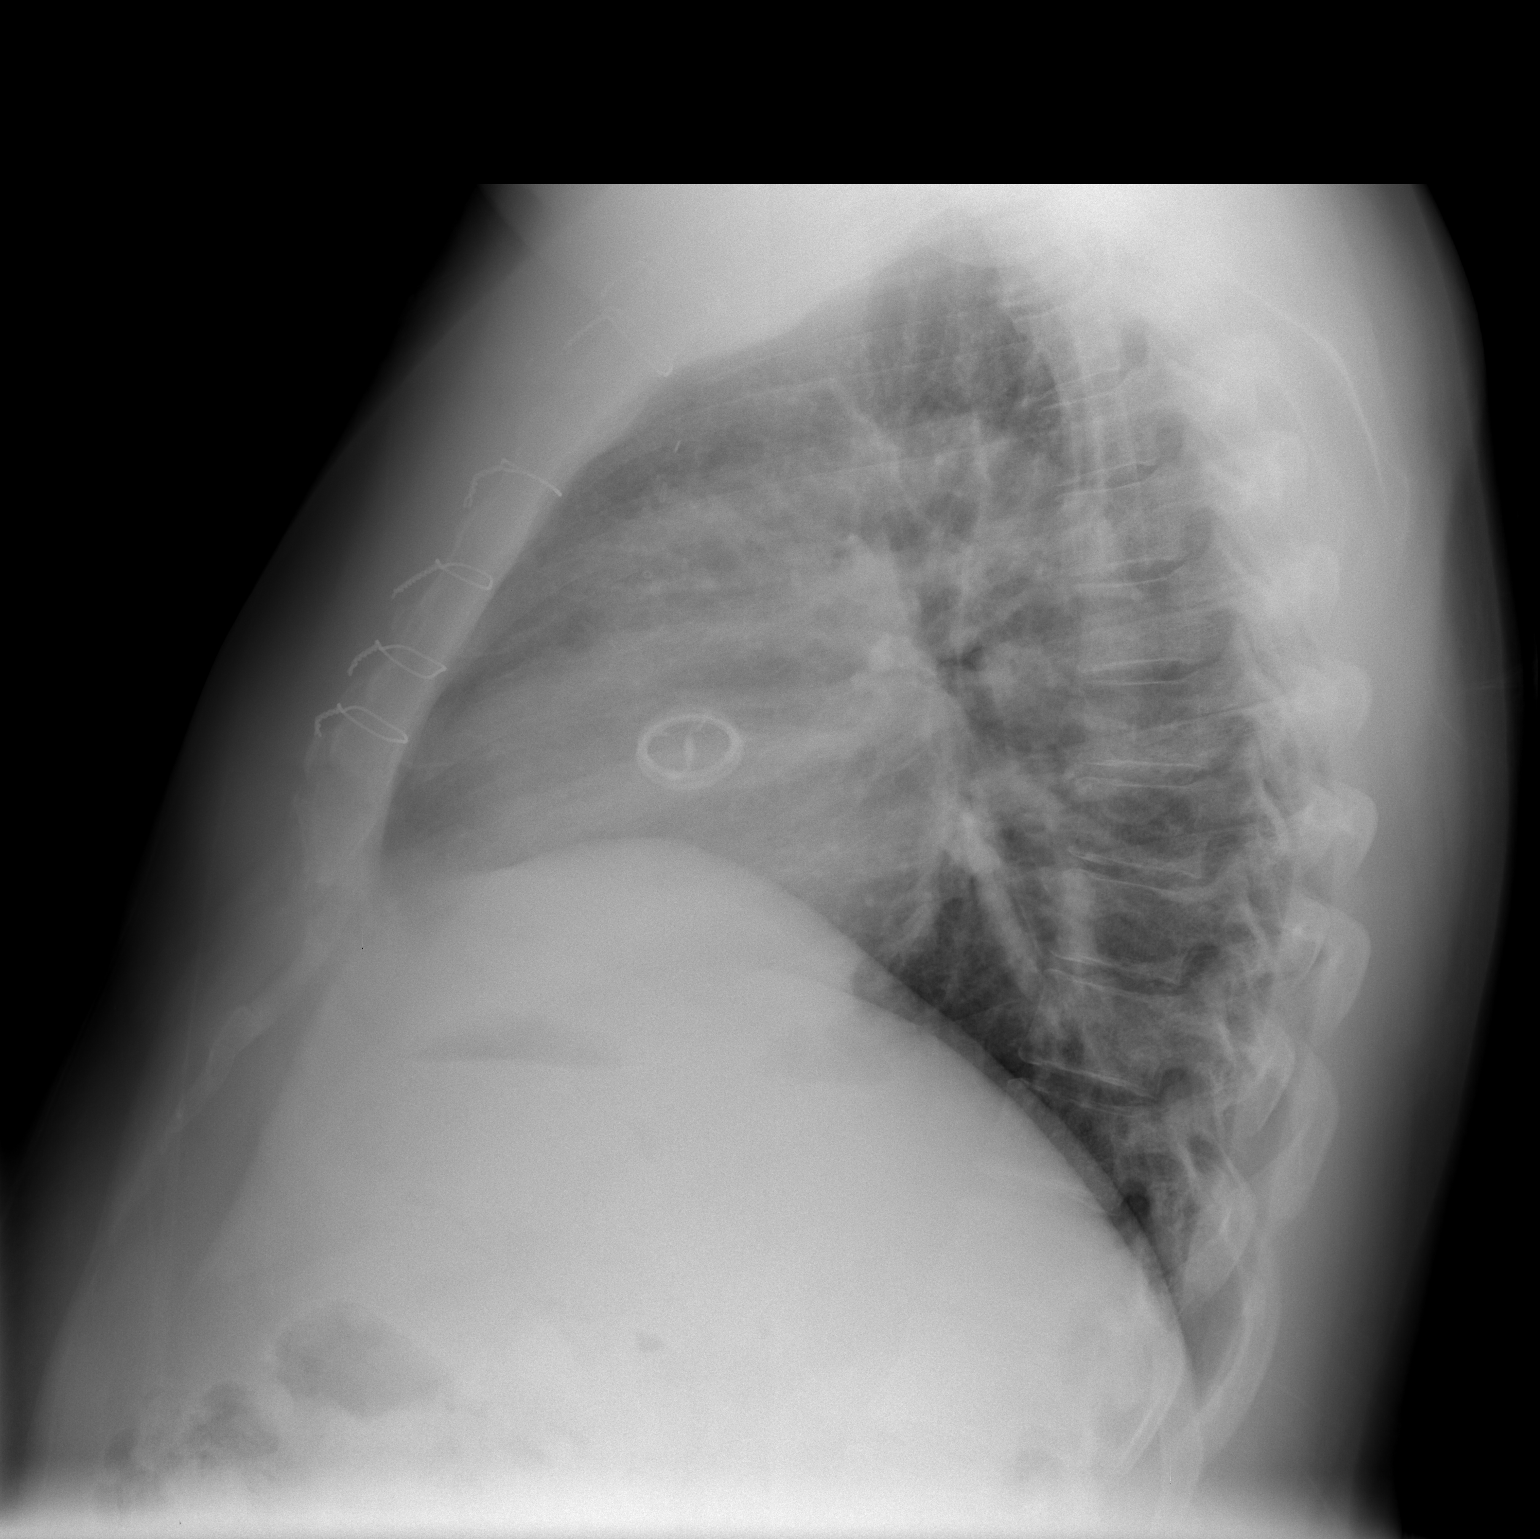

[2 of 2 positions shown; findings below may reference images not displayed]

FINDINGS: Cardiac density is borderline in size.  It appears
slightly smaller than on the previous study.  Previous median
sternotomy and valvular replacement has been performed. The lungs
are well aerated and free of infiltrates. No pleural abnormality is
evident. There is been chronic elevation of the anterior aspect of
the right hemidiaphragm. Bones appear average for age.
IMPRESSION: Borderline cardiac size with no evidence of pulmonary edema,
pneumonia, or pleural effusion.  Stable chronic findings are
described above.

## 2009-12-31 IMAGING — CT CT ANGIO CHEST
1 of 2 series · 19 of 32 positions shown · IV contrast (APPLIED)
Comparison: Chest x-ray in 12/31/2009

CLINICAL DATA: Chest pain.

CT ANGIOGRAPHY CHEST WITH CONTRAST
TECHNIQUE: Multidetector CT imaging of the chest was performed
using the standard protocol during bolus administration of
intravenous contrast.  Multiplanar CT image reconstructions
including MIPs were obtained to evaluate the vascular anatomy.
Contrast:  100 ml Lmnipaque-5YY

[Series 5: pe thins @ 1mm · axial · 0.70mm/px · z∈[+1206,+1466]mm · 19 of 286 slices shown]
[im 13/286  lung]
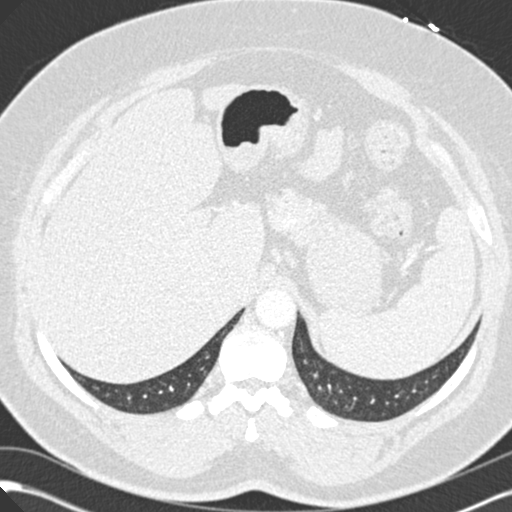
[im 25/286  soft-tissue]
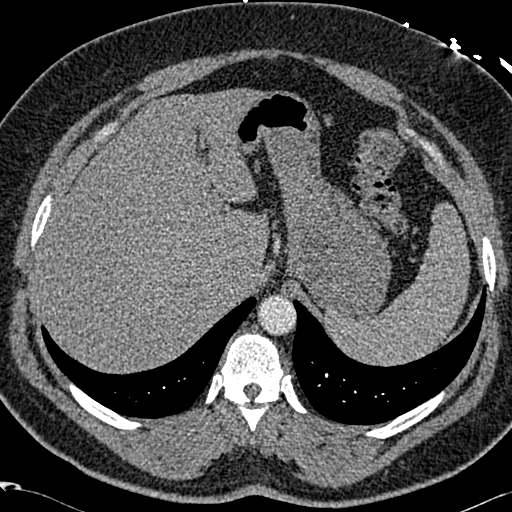
[im 38/286  lung]
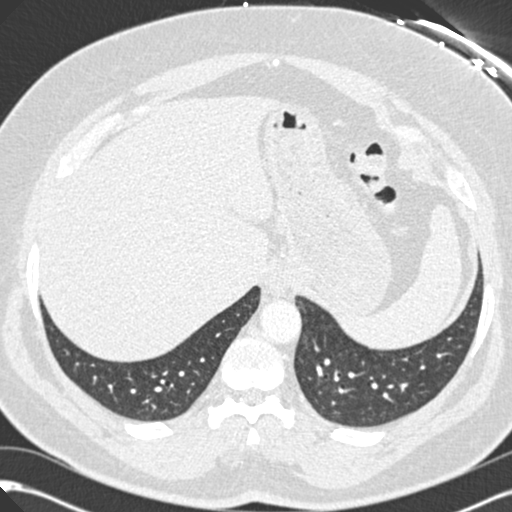
[im 62/286  soft-tissue]
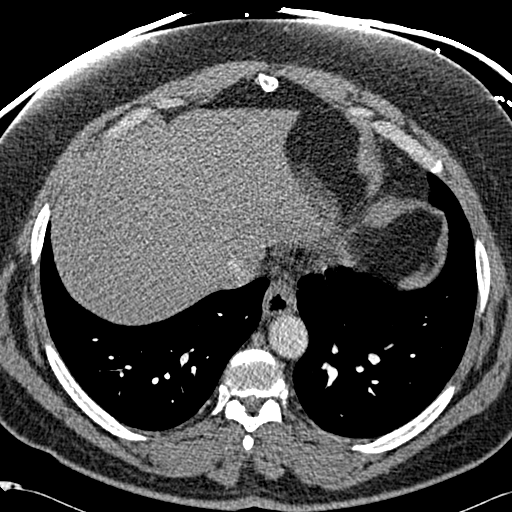
[im 75/286  lung]
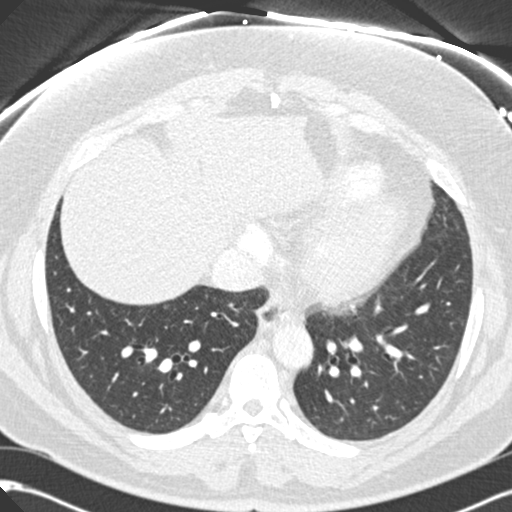
[im 87/286  soft-tissue]
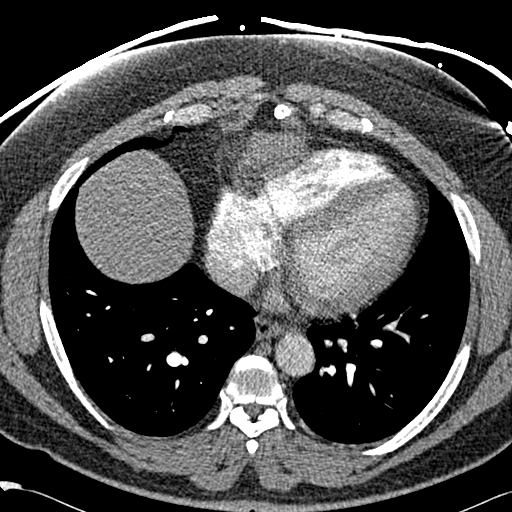
[im 100/286  lung]
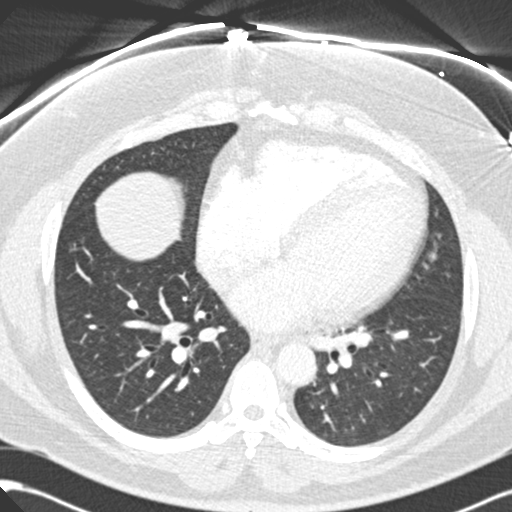
[im 112/286  soft-tissue]
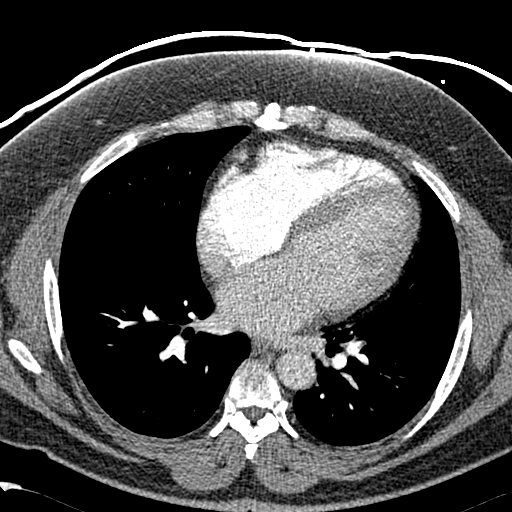
[im 124/286  lung]
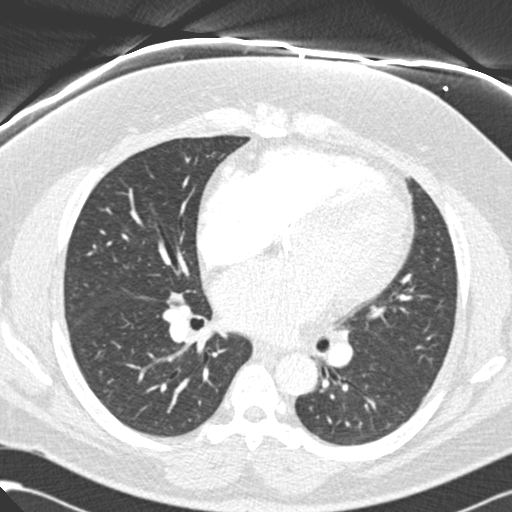
[im 149/286  soft-tissue]
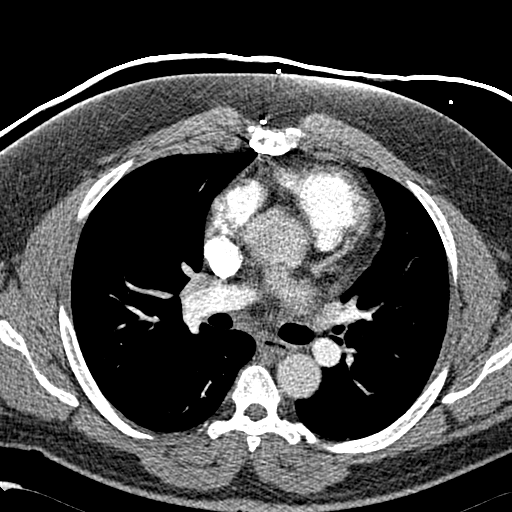
[im 162/286  lung]
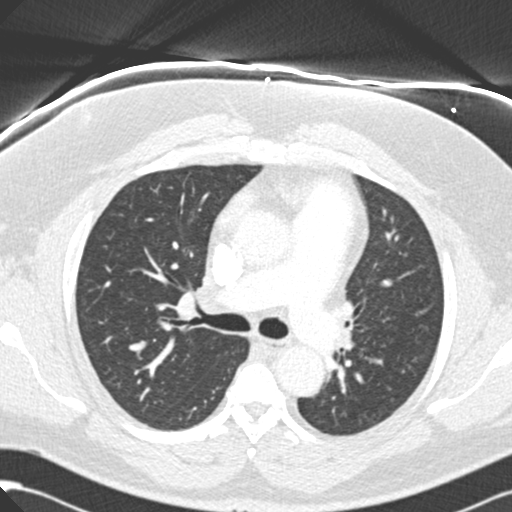
[im 174/286  soft-tissue]
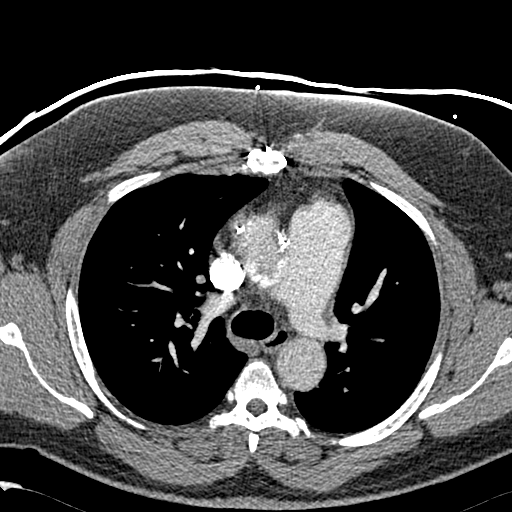
[im 186/286  lung]
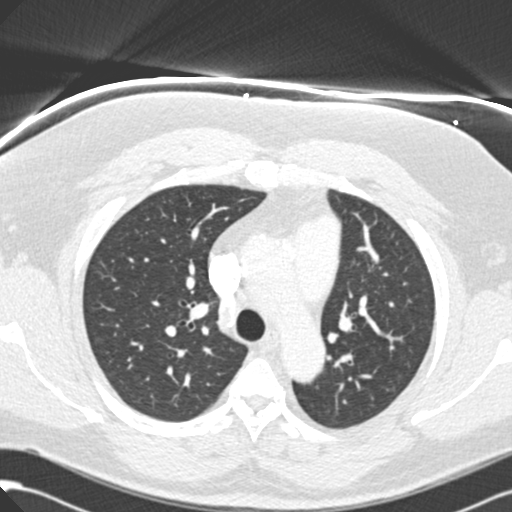
[im 199/286  soft-tissue]
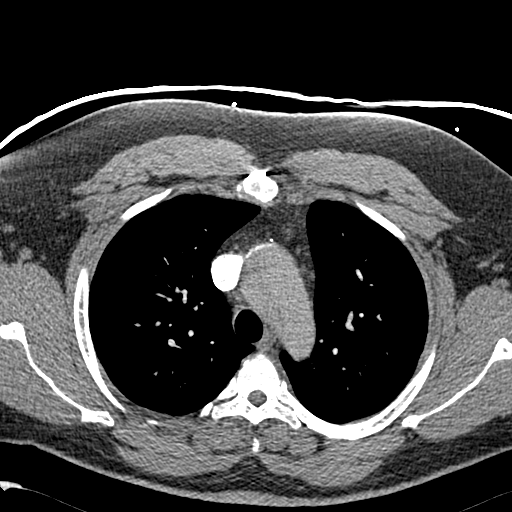
[im 211/286  lung]
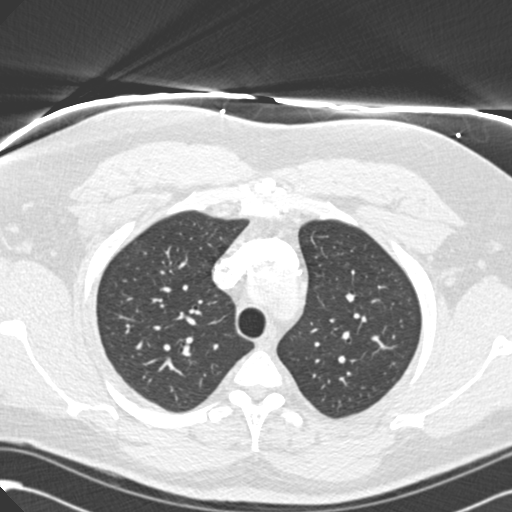
[im 224/286  soft-tissue]
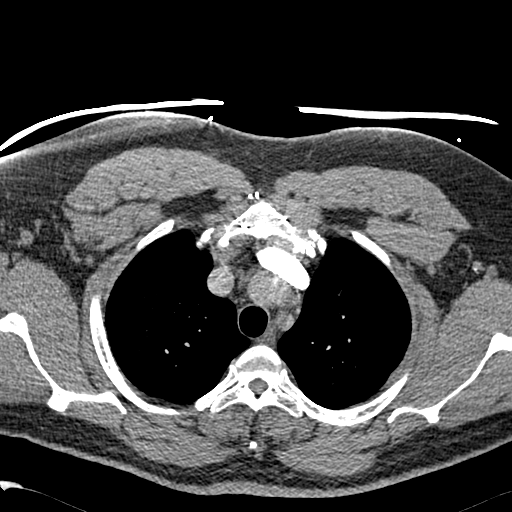
[im 248/286  lung]
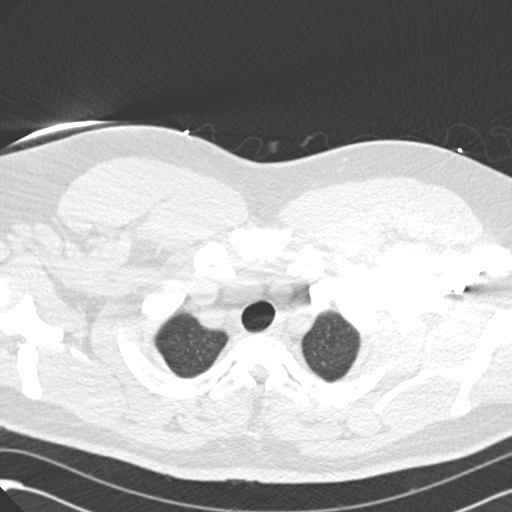
[im 261/286  soft-tissue]
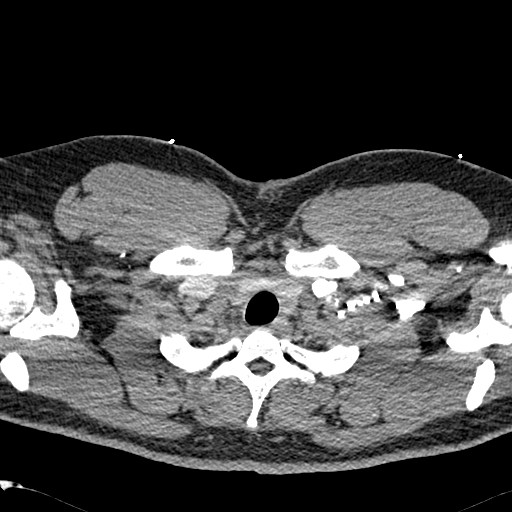
[im 273/286  lung]
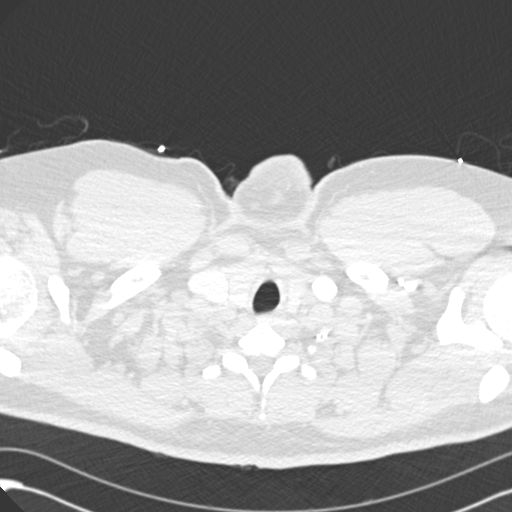

[19 of 32 positions shown; findings below may reference images not displayed]

FINDINGS: There are surgical changes from an aortic valve
replacement and ascending aortic graft.  Just superior to the graft
in the region of the transverse aortic arch there is a dissection.
No aneurysm.  There is a bovine arch with the brachiocephalic trunk
and left carotid coming off a common trunk.

The heart is normal in size.  No pericardial effusion.  No
mediastinal or hilar adenopathy.  The esophagus is grossly normal.
The descending thoracic aorta is normal.

The pulmonary arterial tree is fairly well opacified.  No filling
defects to suggest pulmonary embolism.

Examination of the lung parenchyma demonstrates no acute pulmonary
findings.  No pleural effusions.  No pneumothorax.

The bony thorax is intact.  There are surgical changes from a
median sternotomy.

The visualized upper abdomen is unremarkable.

Review of the MIP images confirms the above findings.
IMPRESSION: 1.  Surgical changes related to aortic valve replacement and
ascending aortic graft.
2.  Short segment aortic dissection involving the transverse
portion of the aortic arch. Normal appearance of the descending
thoracic aorta.
3.  No findings for pulmonary embolism.
4.  No acute pulmonary findings.

## 2010-01-07 ENCOUNTER — Ambulatory Visit: Payer: Self-pay | Admitting: Internal Medicine

## 2010-01-07 LAB — CONVERTED CEMR LAB: POC INR: 2.1

## 2010-01-13 ENCOUNTER — Ambulatory Visit: Payer: Self-pay | Admitting: Internal Medicine

## 2010-02-10 ENCOUNTER — Ambulatory Visit: Payer: Self-pay | Admitting: Cardiology

## 2010-03-10 ENCOUNTER — Ambulatory Visit: Payer: Self-pay | Admitting: Internal Medicine

## 2010-03-10 LAB — CONVERTED CEMR LAB: POC INR: 2.5

## 2010-03-25 ENCOUNTER — Ambulatory Visit: Payer: Self-pay | Admitting: Internal Medicine

## 2010-04-07 ENCOUNTER — Ambulatory Visit: Payer: Self-pay | Admitting: Cardiovascular Disease

## 2010-04-12 ENCOUNTER — Ambulatory Visit: Payer: Self-pay | Admitting: Internal Medicine

## 2010-04-12 LAB — CONVERTED CEMR LAB
BUN: 13 mg/dL (ref 6–23)
CO2: 33 meq/L — ABNORMAL HIGH (ref 19–32)
Chloride: 99 meq/L (ref 96–112)
Creatinine, Ser: 1.1 mg/dL (ref 0.4–1.5)
Glucose, Bld: 147 mg/dL — ABNORMAL HIGH (ref 70–99)
HDL: 31.6 mg/dL — ABNORMAL LOW (ref >39.00)
LDL Cholesterol: 67 mg/dL (ref 0–99)
Potassium: 3.9 meq/L (ref 3.5–5.1)
Total CHOL/HDL Ratio: 4
VLDL: 36.2 mg/dL (ref 0.0–40.0)

## 2010-05-05 ENCOUNTER — Ambulatory Visit: Payer: Self-pay

## 2010-05-12 ENCOUNTER — Ambulatory Visit: Payer: Self-pay | Admitting: Internal Medicine

## 2010-05-26 ENCOUNTER — Emergency Department (HOSPITAL_COMMUNITY)
Admission: EM | Admit: 2010-05-26 | Discharge: 2010-05-26 | Payer: Self-pay | Source: Home / Self Care | Admitting: Emergency Medicine

## 2010-05-26 LAB — D-DIMER, QUANTITATIVE: D-Dimer, Quant: 0.72 ug/mL-FEU — ABNORMAL HIGH (ref 0.00–0.48)

## 2010-05-26 LAB — POCT CARDIAC MARKERS
CKMB, poc: <1 ng/mL — ABNORMAL LOW (ref 1.0–8.0)
Myoglobin, poc: 71.3 ng/mL (ref 12–200)
Troponin i, poc: <0.05 ng/mL (ref 0.00–0.09)

## 2010-05-26 LAB — BASIC METABOLIC PANEL
BUN: 13 mg/dL (ref 6–23)
CO2: 28 mEq/L (ref 19–32)
Calcium: 8.9 mg/dL (ref 8.4–10.5)
Chloride: 104 mEq/L (ref 96–112)
Creatinine, Ser: 0.93 mg/dL (ref 0.4–1.5)
GFR calc Af Amer: >60 mL/min (ref >60)
GFR calc non Af Amer: >60 mL/min (ref >60)
Glucose, Bld: 210 mg/dL — ABNORMAL HIGH (ref 70–99)
Potassium: 3.9 mEq/L (ref 3.5–5.1)
Sodium: 138 mEq/L (ref 135–145)

## 2010-05-26 LAB — PROTIME-INR
INR: 2.02 — ABNORMAL HIGH (ref 0.00–1.49)
Prothrombin Time: 23 seconds — ABNORMAL HIGH (ref 11.6–15.2)

## 2010-05-26 LAB — CBC
HCT: 40.9 % (ref 39.0–52.0)
Hemoglobin: 14.3 g/dL (ref 13.0–17.0)
MCH: 27.9 pg (ref 26.0–34.0)
MCHC: 35 g/dL (ref 30.0–36.0)
MCV: 79.9 fL (ref 78.0–100.0)
Platelets: 173 10*3/uL (ref 150–400)
RBC: 5.12 MIL/uL (ref 4.22–5.81)
RDW: 16 % — ABNORMAL HIGH (ref 11.5–15.5)
WBC: 6.8 10*3/uL (ref 4.0–10.5)

## 2010-05-26 IMAGING — CT CT ANGIO CHEST
1 of 3 series · 17 of 32 positions shown · IV contrast (APPLIED)
Comparison: Chest radiograph same date, chest CT 12/31/2009

CLINICAL DATA: Left-sided chest pain.  History of aortic aneurysm
repair.

CT ANGIOGRAPHY CHEST WITH CONTRAST
TECHNIQUE: Multidetector CT imaging of the chest was performed
using the standard protocol during bolus administration of
intravenous contrast.  Multiplanar CT image reconstructions
including MIPs were obtained to evaluate the vascular anatomy.
Contrast:  80 ml Omniscan 300 IV contrast

[Series 7: pe thins @ 1mm · axial · 0.70mm/px · z∈[-300,-72]mm · 17 of 258 slices shown]
[im 15/258  lung]
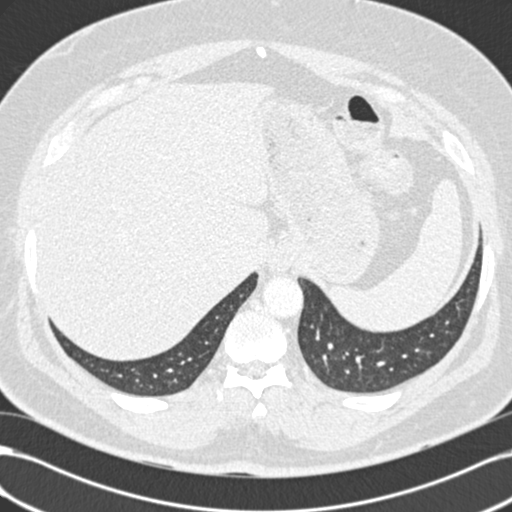
[im 29/258  soft-tissue]
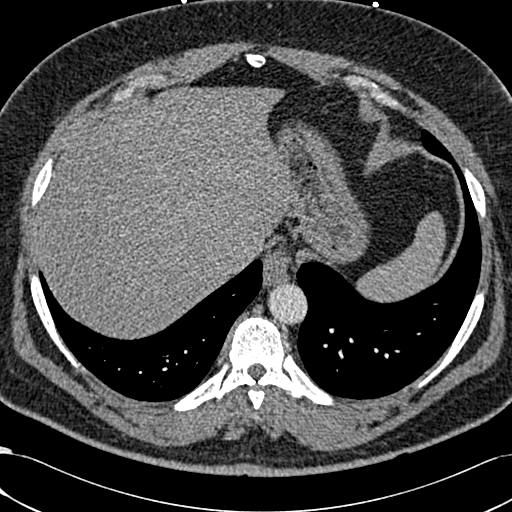
[im 43/258  lung]
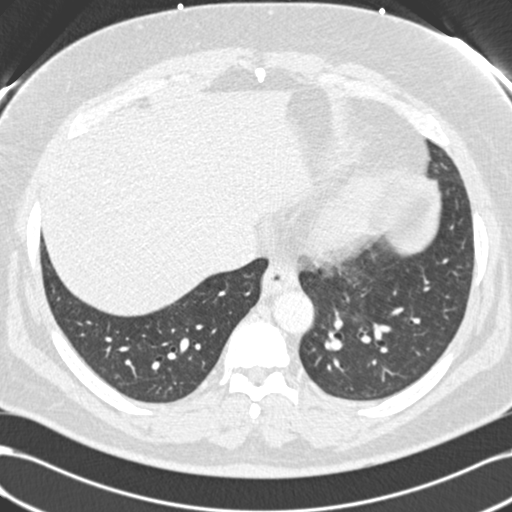
[im 58/258  soft-tissue]
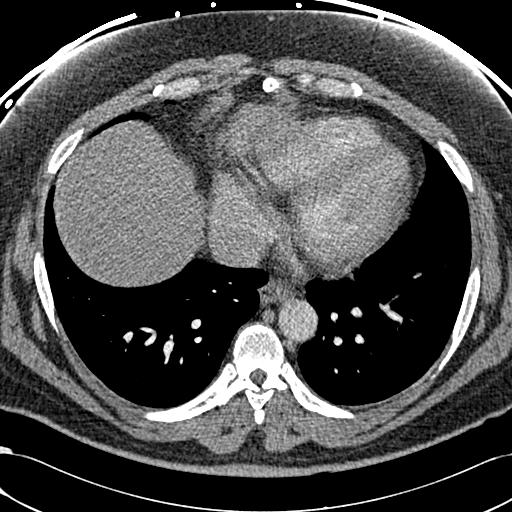
[im 72/258  lung]
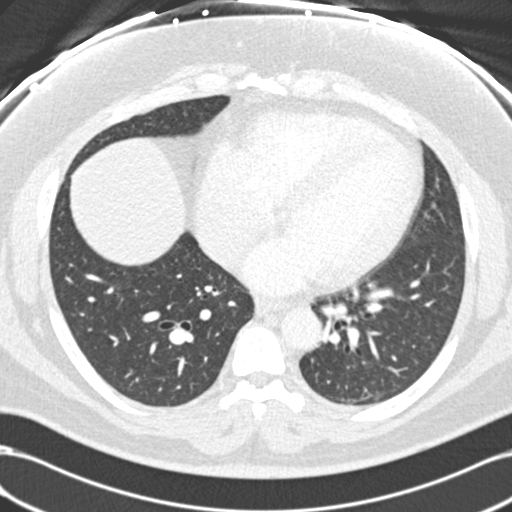
[im 86/258  soft-tissue]
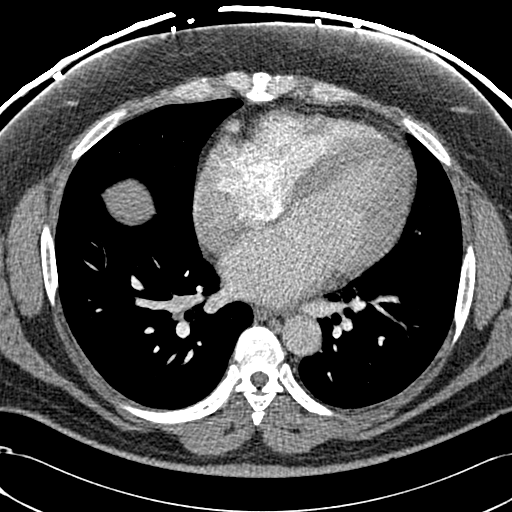
[im 100/258  lung]
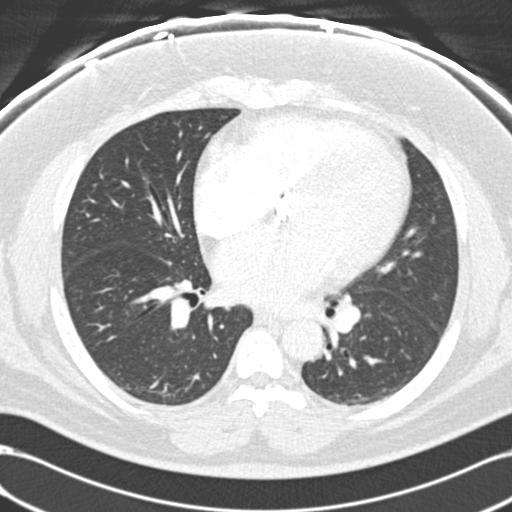
[im 115/258  soft-tissue]
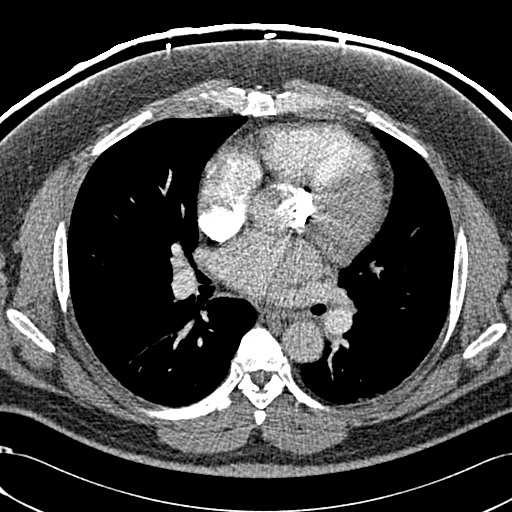
[im 129/258  lung]
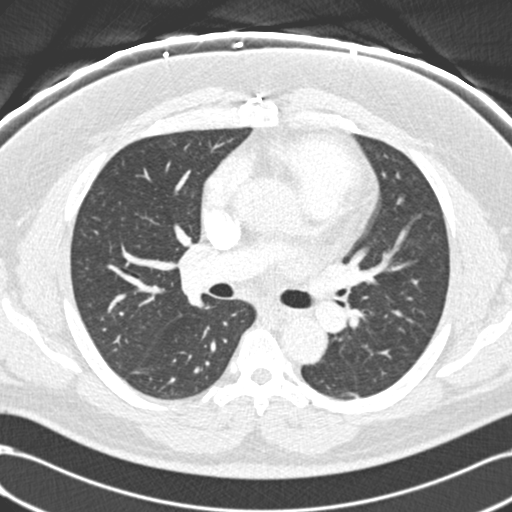
[im 143/258  soft-tissue]
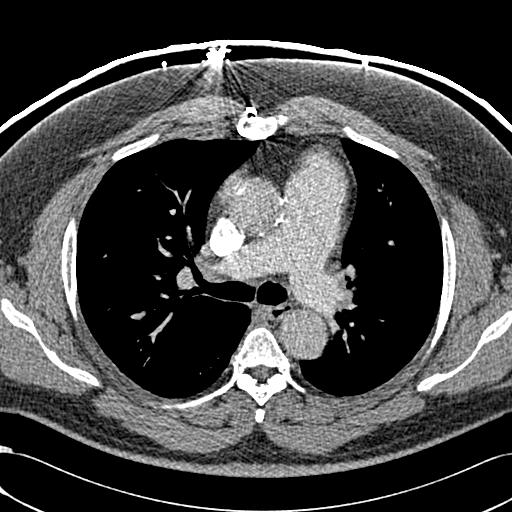
[im 158/258  lung]
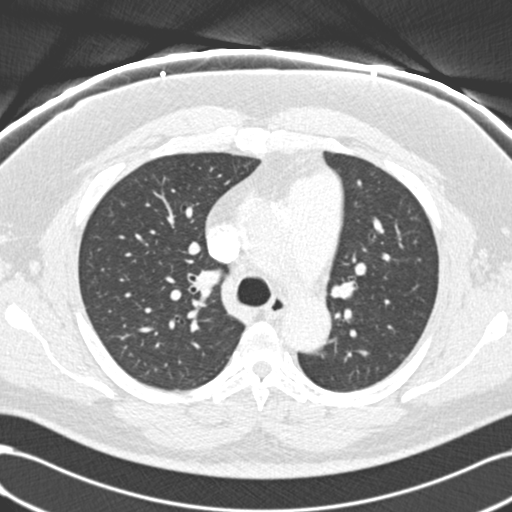
[im 172/258  soft-tissue]
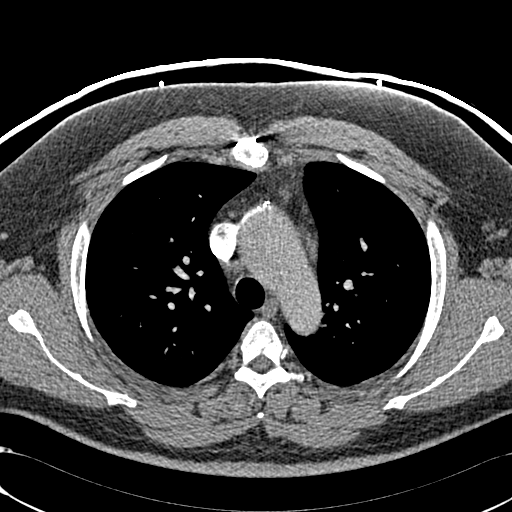
[im 186/258  lung]
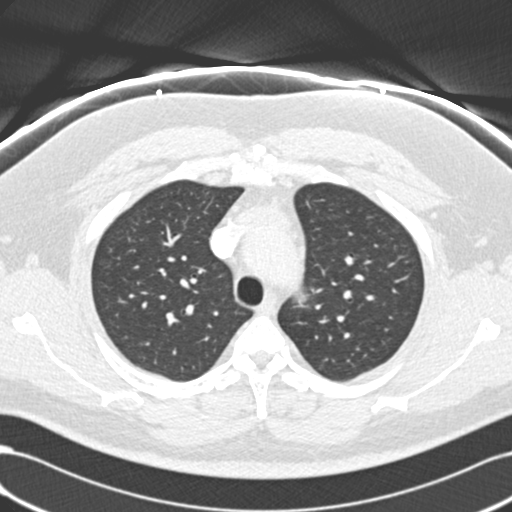
[im 200/258  soft-tissue]
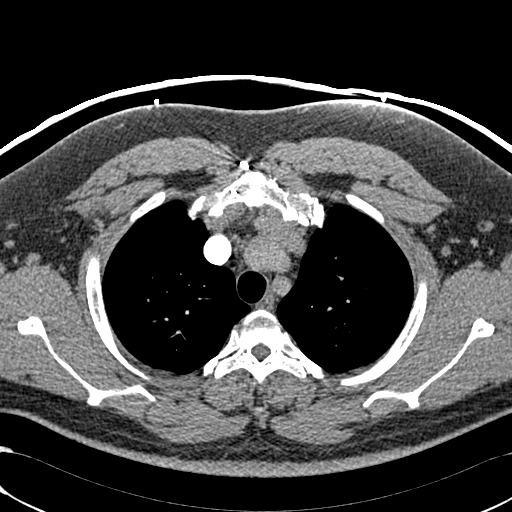
[im 215/258  lung]
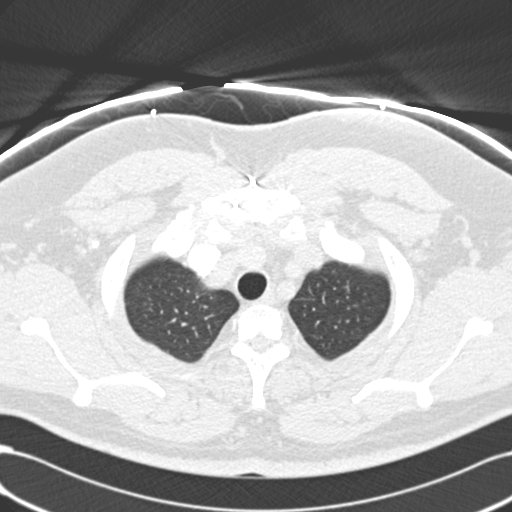
[im 229/258  soft-tissue]
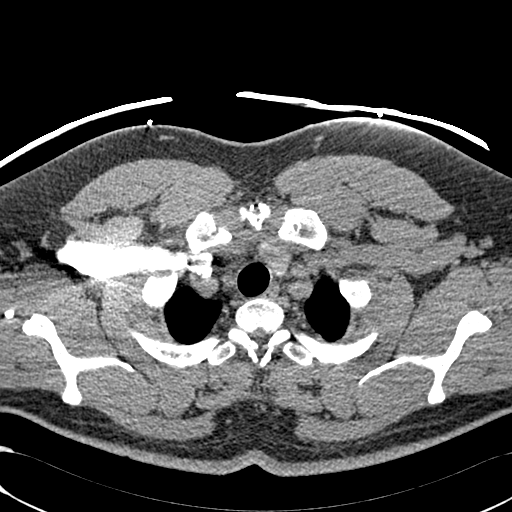
[im 243/258  lung]
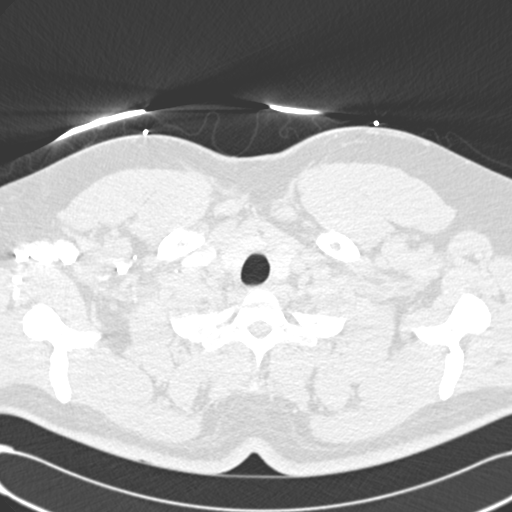

[17 of 32 positions shown; findings below may reference images not displayed]

FINDINGS: Initial scanning through the pulmonary arteries
performed with automated bolus tracking demonstrated insufficient
opacification of the pulmonary arteries and images were repeated
and the the patient read bolus.  Opacification of the pulmonary
arteries is minimally improved on the rescanned images, although
markedly suboptimal for detection of acute pulmonary embolism.  No
large central focal filling defect is seen in either main pulmonary
artery to suggest acute pulmonary embolism.  Heart size is mildly
enlarged.  Aortic valvuloplasty and ascending aortic graft noted.
No lymphadenopathy.  No pericardial or pleural effusion.  The lungs
are clear.  No acute osseous finding.  Median sternotomy changes
are noted.

Review of the MIP images confirms the above findings.
IMPRESSION: Allowing for suboptimal bolus timing, no central pulmonary arterial
filling defect in either main pulmonary artery is seen to suggest
pulmonary embolism.  No acute abnormality.

## 2010-05-26 IMAGING — CR DG CHEST 2V
2 series · 2 of 2 positions shown · non-contrast
Comparison: 12/31/2009

CLINICAL DATA: Chest pain

CHEST - 2 VIEW

[w chest pa]
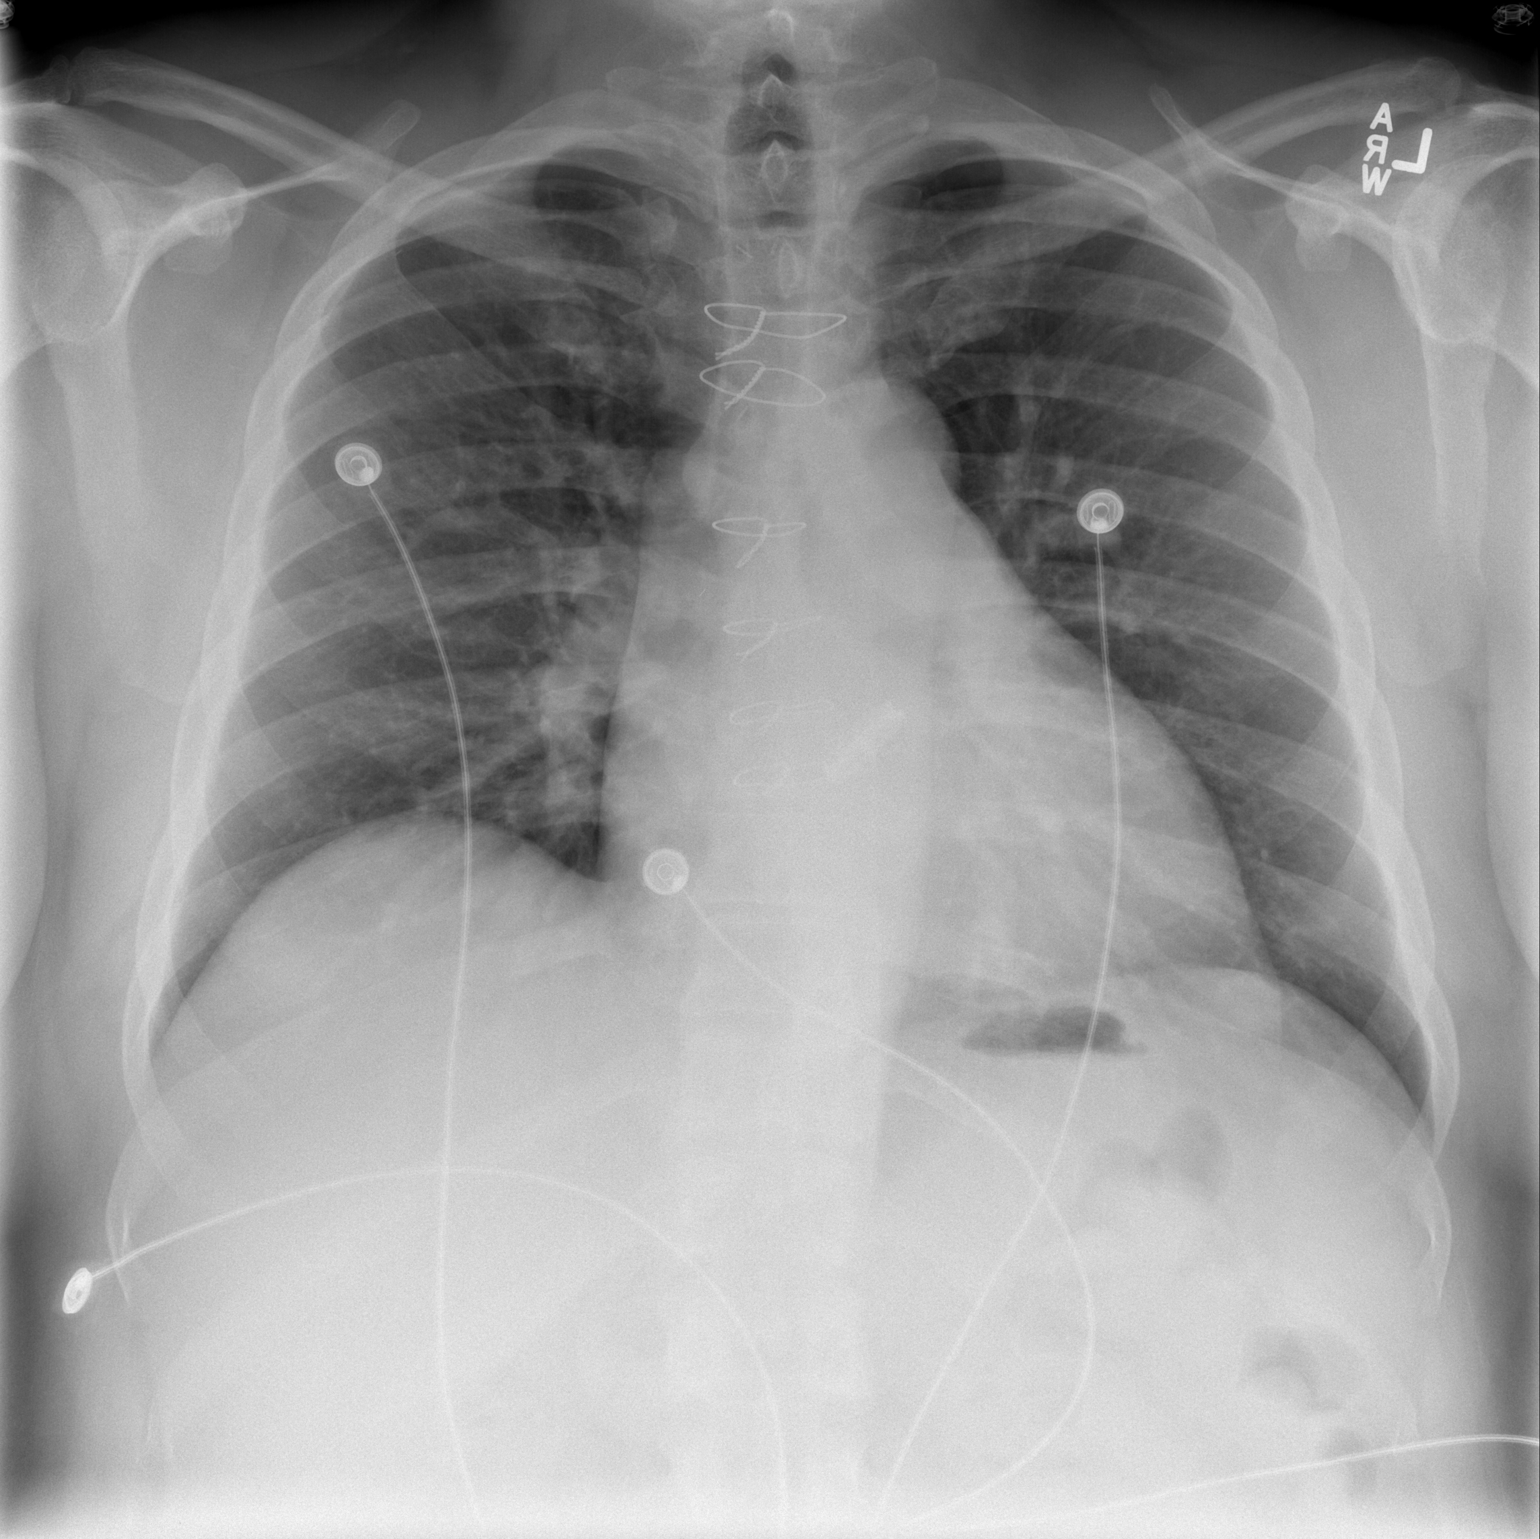

[w chest lat]
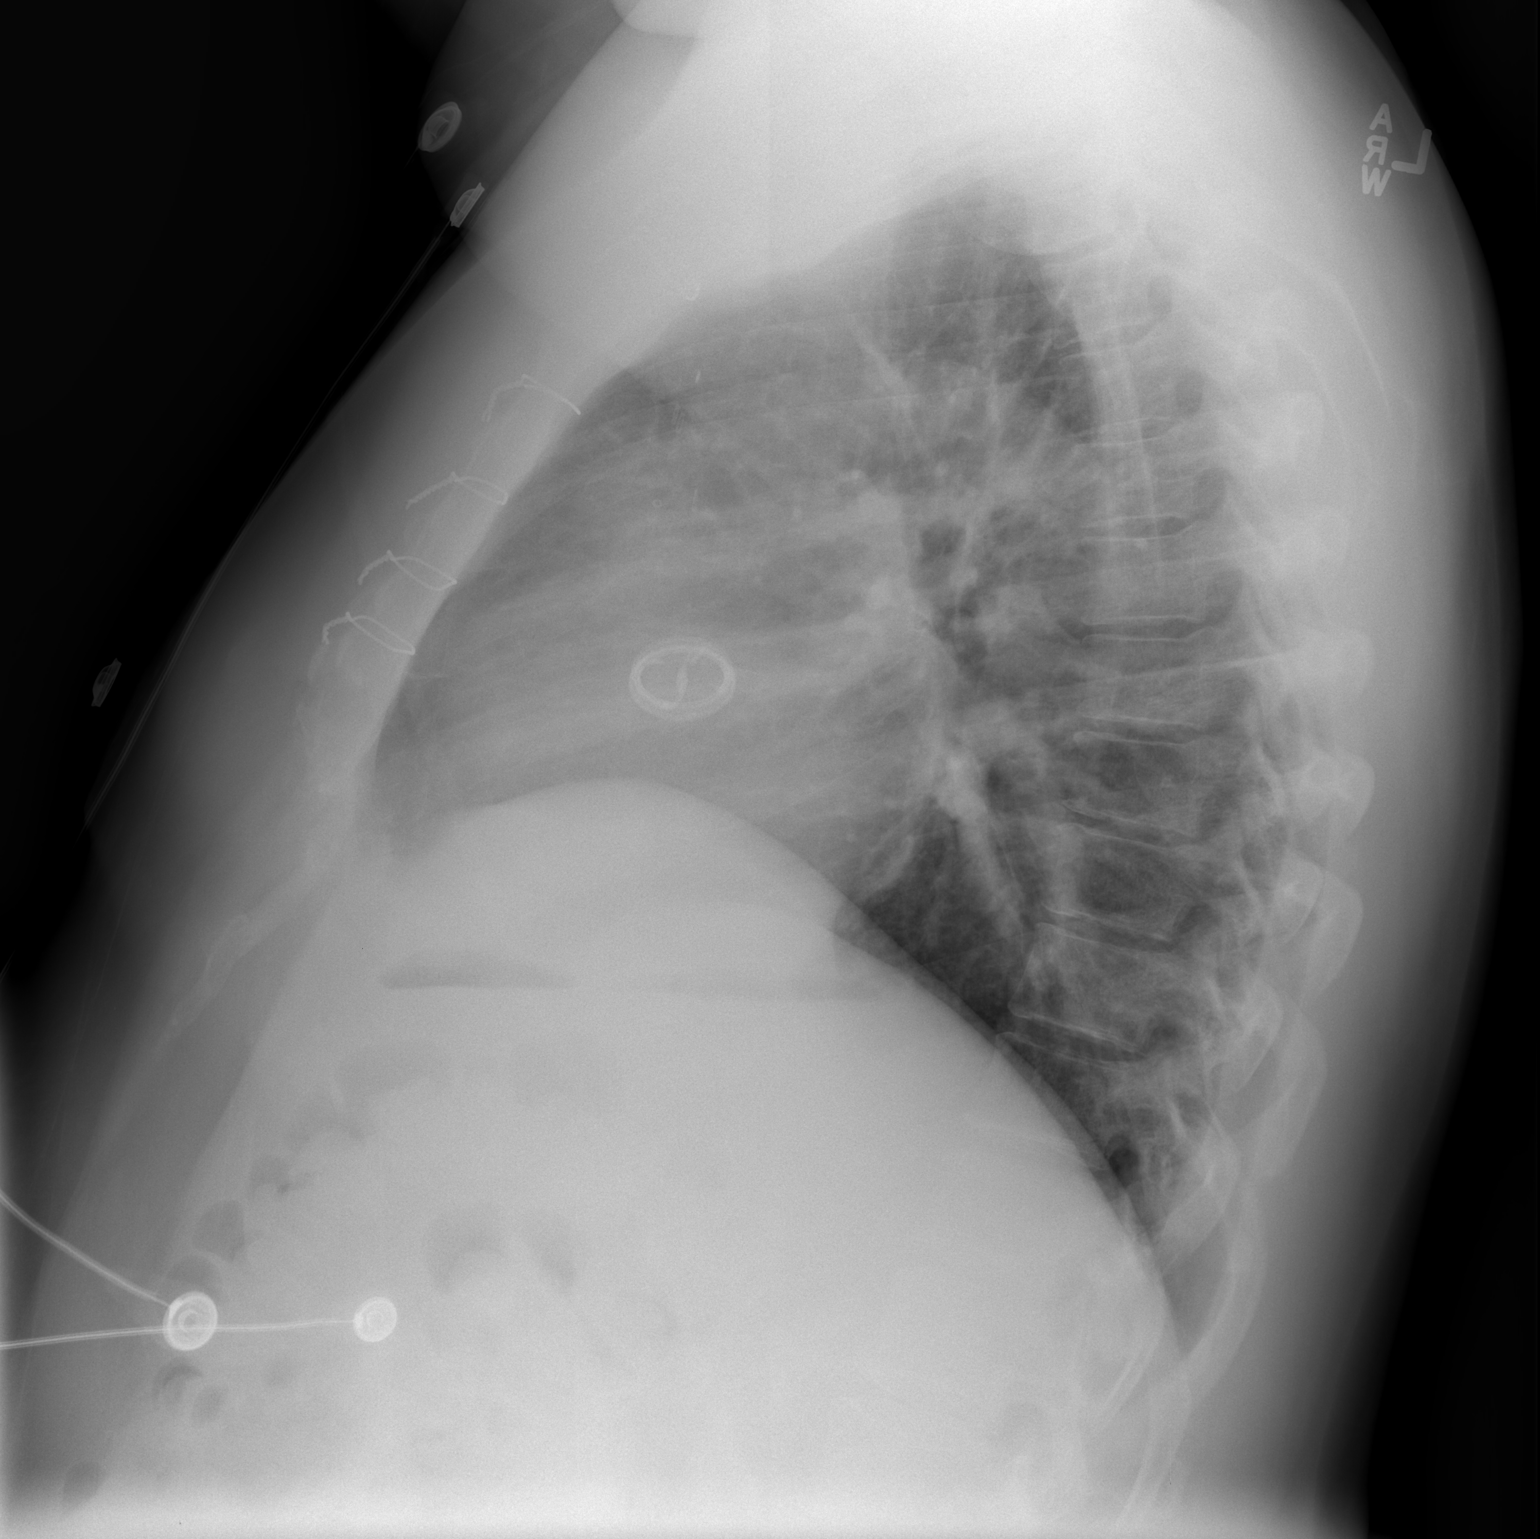

[2 of 2 positions shown; findings below may reference images not displayed]

FINDINGS: Post aortic valve replacement.  Heart size upper normal.
No congestive heart failure, pleural fluid, or active disease.
IMPRESSION: Post aortic valve replacement - no active disease.

## 2010-05-27 ENCOUNTER — Inpatient Hospital Stay (HOSPITAL_COMMUNITY)
Admission: EM | Admit: 2010-05-27 | Discharge: 2010-05-27 | Payer: Self-pay | Attending: Internal Medicine | Admitting: Internal Medicine

## 2010-05-27 ENCOUNTER — Ambulatory Visit: Admit: 2010-05-27 | Payer: Self-pay

## 2010-05-27 LAB — CARDIAC PANEL(CRET KIN+CKTOT+MB+TROPI)
CK, MB: 0.8 ng/mL (ref 0.3–4.0)
CK, MB: 0.8 ng/mL (ref 0.3–4.0)
Relative Index: INVALID (ref 0.0–2.5)
Relative Index: 0.8 (ref 0.0–2.5)
Total CK: 98 U/L (ref 7–232)
Total CK: 105 U/L (ref 7–232)
Troponin I: 0.03 ng/mL (ref 0.00–0.06)
Troponin I: 0.03 ng/mL (ref 0.00–0.06)

## 2010-05-27 LAB — LIPID PANEL
Cholesterol: 108 mg/dL (ref 0–200)
HDL: 26 mg/dL — ABNORMAL LOW (ref >39)
LDL Cholesterol: 51 mg/dL (ref 0–99)
Total CHOL/HDL Ratio: 4.2 RATIO
Triglycerides: 153 mg/dL — ABNORMAL HIGH (ref <150)
VLDL: 31 mg/dL (ref 0–40)

## 2010-05-27 LAB — GLUCOSE, CAPILLARY
Glucose-Capillary: 271 mg/dL — ABNORMAL HIGH (ref 70–99)
Glucose-Capillary: 183 mg/dL — ABNORMAL HIGH (ref 70–99)
Glucose-Capillary: 220 mg/dL — ABNORMAL HIGH (ref 70–99)

## 2010-05-27 LAB — HEMOGLOBIN A1C
Hgb A1c MFr Bld: 7.9 % — ABNORMAL HIGH (ref <5.7)
Mean Plasma Glucose: 180 mg/dL — ABNORMAL HIGH (ref <117)

## 2010-05-27 LAB — MRSA PCR SCREENING: MRSA by PCR: NEGATIVE

## 2010-05-27 LAB — TSH: TSH: 5.217 u[IU]/mL — ABNORMAL HIGH (ref 0.350–4.500)

## 2010-05-31 ENCOUNTER — Ambulatory Visit: Admission: RE | Admit: 2010-05-31 | Discharge: 2010-05-31 | Payer: Self-pay | Source: Home / Self Care

## 2010-05-31 LAB — CONVERTED CEMR LAB
INR: 1.8
POC INR: 1.8

## 2010-06-08 ENCOUNTER — Telehealth (INDEPENDENT_AMBULATORY_CARE_PROVIDER_SITE_OTHER): Payer: Self-pay | Admitting: Radiology

## 2010-06-09 ENCOUNTER — Encounter: Payer: Self-pay | Admitting: Internal Medicine

## 2010-06-09 ENCOUNTER — Ambulatory Visit: Admission: RE | Admit: 2010-06-09 | Discharge: 2010-06-09 | Payer: Self-pay | Source: Home / Self Care

## 2010-06-09 ENCOUNTER — Encounter (HOSPITAL_COMMUNITY)
Admission: RE | Admit: 2010-06-09 | Discharge: 2010-06-22 | Payer: Self-pay | Source: Home / Self Care | Attending: Internal Medicine | Admitting: Internal Medicine

## 2010-06-14 ENCOUNTER — Encounter: Payer: Self-pay | Admitting: Internal Medicine

## 2010-06-14 ENCOUNTER — Ambulatory Visit: Admission: RE | Admit: 2010-06-14 | Discharge: 2010-06-14 | Payer: Self-pay | Source: Home / Self Care

## 2010-06-14 ENCOUNTER — Ambulatory Visit
Admission: RE | Admit: 2010-06-14 | Discharge: 2010-06-14 | Payer: Self-pay | Source: Home / Self Care | Attending: Internal Medicine | Admitting: Internal Medicine

## 2010-06-14 LAB — CONVERTED CEMR LAB: POC INR: 2.1

## 2010-06-15 ENCOUNTER — Encounter (INDEPENDENT_AMBULATORY_CARE_PROVIDER_SITE_OTHER): Payer: Self-pay | Admitting: *Deleted

## 2010-06-22 NOTE — Assessment & Plan Note (Signed)
Summary: 6 mo rov /nws  #   Vital Signs:  Patient profile:   45 year old [name] Height:      74 inches Weight:      306.50 pounds BMI:     39.49 O2 Sat:      97 % on Room air Temp:     97.9 degrees F oral Pulse rate:   64 / minute BP sitting:   110 / 88  (left arm) Cuff size:   large  Vitals Entered By: Zella Ball Ewing CMA Duncan Dull) (April 12, 2010 10:13 AM)  O2 Flow:  Room air  CC: 6 month ROV/RE   Primary Care Provider:  [name] MD  CC:  6 month ROV/RE.  History of Present Illness: here for f/u - overall doing well;    wt down from 331 to 306 since may 2011 with better diet;  Pt denies CP, worsening sob, doe, wheezing, orthopnea, pnd, worsening LE edema, palps, dizziness or syncope  Pt denies new neuro symptoms such as headache, facial or extremity weakness  Pt denies polydipsia, polyuria, or low sugar symptoms such as shakiness improved with eating.  Overall good compliance with meds, trying to follow low chol, DM diet, little excercise however Denies worsening depressive symptoms, suicidal ideation, or panic.   No fever, wt loss, night sweats, loss of appetite or other constitutional symptoms   Problems Prior to Update: 1)  Need Prophylactic Vaccination&inoculation Flu  (ICD-V04.81) 2)  Chest Pain-unspecified  (ICD-786.50) 3)  Chest Tightness-pressure-other  ([id number]) 4)  Coumadin Therapy  (ICD-V58.61) 5)  Aortic Valve Disorders  (ICD-424.1) 6)  Rash-nonvesicular  (ICD-782.1) 7)  Gross Hematuria  (ICD-599.71) 8)  Unspecified Vitamin D Deficiency  (ICD-268.9) 9)  Special Screening Malignant Neoplasm of Prostate  (ICD-V76.44) 10)  Fatigue  (ICD-780.79) 11)  Preventive Health Care  (ICD-V70.0) 12)  Transient Ischemic Attack, Hx of  (ICD-V12.50) 13)  Hyperlipidemia  (ICD-272.4) 14)  Family History Diabetes 1st Degree Relative  (ICD-V18.0) 15)  Family History of Cad [name] 1st Degree Relative <50  (ICD-V17.3) 16)  Tia  (ICD-435.9) 17)  Hx of Nephrolithiasis, Hx of   (ICD-V13.01) 18)  Diabetes Mellitus, Type II  (ICD-250.00) 19)  Hypertension  (ICD-401.9) 20)  Cad  (ICD-414.00)  Medications Prior to Update: 1)  Metformin Hcl 1000 Mg  Tabs (Metformin Hcl) .... Take 1 Tablet By Mouth Two Times A Day 2)  Low-Dose Aspirin 81 Mg  Tabs (Aspirin) .... Take 1 Tablet By Mouth Once A Day 3)  Hyzaar 100-25 Mg Tabs (Losartan Potassium-Hctz) .... Take 1 Tablet By Mouth Once A Day 4)  Clonidine Hcl 0.2 Mg  Tabs (Clonidine Hcl) .... Take 1 Tablet By Mouth Two Times A Day 5)  Pravastatin Sodium 40 Mg  Tabs (Pravastatin Sodium) .... Take 1 Tablet By Mouth Once A Day 6)  Lantus Solostar 100 Unit/ml  Soln (Insulin Glargine) .... Zachary Units Subcutaneously Once Daily 7)  Klor-Con M20 20 Meq  Tbcr (Potassium Chloride Crys Cr) .... Take 2 Tablet By Mouth Once A Day 8)  Toprol Xl 200 Mg  Tb24 (Metoprolol Succinate) .... Take 1 Tablet By Mouth Once A Day 9)  Amlodipine Besylate 10 Mg Tabs (Amlodipine Besylate) .[name].. 1po Once Daily 10)  Bd Insulin Syringe Ultrafine 31g X 5/16" 1 Ml  Misc (Insulin Syringe-Needle U-100) .... Use As Directed For Blood Sugar 11)  Vitamin D3 1000 Unit  Tabs (Cholecalciferol) .[name].. 1 By Mouth Once Daily 30 12)  Warfarin Sodium 10 Mg Tabs (  Warfarin Sodium) .... Use As Directed By Anticoagulation Clinic 13)  Furosemide 40 Mg Tabs (Furosemide) .[name].. 1po Once Daily  Current Medications (verified): 1)  Metformin Hcl 1000 Mg  Tabs (Metformin Hcl) .... Take 1 Tablet By Mouth Two Times A Day 2)  Low-Dose Aspirin 81 Mg  Tabs (Aspirin) .... Take 1 Tablet By Mouth Once A Day 3)  Hyzaar 100-25 Mg Tabs (Losartan Potassium-Hctz) .... Take 1 Tablet By Mouth Once A Day 4)  Clonidine Hcl 0.2 Mg  Tabs (Clonidine Hcl) .... Take 1 Tablet By Mouth Two Times A Day 5)  Pravastatin Sodium 40 Mg  Tabs (Pravastatin Sodium) .... Take 1 Tablet By Mouth Once A Day 6)  Lantus Solostar 100 Unit/ml  Soln (Insulin Glargine) .... Zachary Units Subcutaneously Once Daily 7)  Klor-Con M20 20 Meq   Tbcr (Potassium Chloride Crys Cr) .... Take 2 Tablet By Mouth Once A Day 8)  Toprol Xl 200 Mg  Tb24 (Metoprolol Succinate) .... Take 1 Tablet By Mouth Once A Day 9)  Amlodipine Besylate 10 Mg Tabs (Amlodipine Besylate) .[name].. 1po Once Daily 10)  Bd Insulin Syringe Ultrafine 31g X 5/16" 1 Ml  Misc (Insulin Syringe-Needle U-100) .... Use As Directed For Blood Sugar 11)  Vitamin D3 1000 Unit  Tabs (Cholecalciferol) .[name].. 1 By Mouth Once Daily 30 12)  Warfarin Sodium 10 Mg Tabs (Warfarin Sodium) .... Use As Directed By Anticoagulation Clinic 13)  Furosemide 40 Mg Tabs (Furosemide) .[name].. 1po Once Daily  Allergies (verified): No Known Drug Allergies  Past History:  Past Medical History: Last updated: 12/17/2009 Ascending aortic dissection   --s/p AVR (mechanical) with re-implant of coronaries 2000   --coronaries on cath 2000 Anticoagulation Hypertension. Type 2 diabetes. History of nephrolithiasis Transient ischemic attack, hx of Hyperlipidemia vit d deficiency  Past Surgical History: Last updated: 07/29/2008 Appendectomy 1998 Coronary artery bypass graft -- 2000  Social History: Last updated: 09/30/2009 Single - separated 4 children Disabled Never Smoked Alcohol use-no Drug use-no Tobacco Use - No.   Risk Factors: Smoking Status: never (07/29/2008)  Review of Systems       all otherwise negative per pt -    Physical Exam  General:  alert and overweight-appearing.   Head:  normocephalic and atraumatic.   Eyes:  vision grossly intact, pupils equal, and pupils round.   Ears:  R ear normal and L ear normal.   Nose:  no external deformity and no nasal discharge.   Mouth:  no gingival abnormalities and pharynx pink and moist.   Neck:  supple and no masses.   Lungs:  normal respiratory effort and normal breath sounds.   Heart:  normal rate and regular rhythm.   Extremities:   no erythema but trace  bilat ankle edema   Impression & Recommendations:  Problem # 1:   DIABETES MELLITUS, TYPE II (ICD-250.00)  His updated medication list for this problem includes:    Metformin Hcl 1000 Mg Tabs (Metformin hcl) .[name]... Take 1 tablet by mouth two times a day    Low-dose Aspirin 81 Mg Tabs (Aspirin) .[name]... Take 1 tablet by mouth once a day    Hyzaar 100-25 Mg Tabs (Losartan potassium-hctz) .[name]... Take 1 tablet by mouth once a day    Lantus Solostar 100 Unit/ml Soln (Insulin glargine) .Marland KitchenMarland KitchenMarland KitchenMarland Kitchen Zachary units subcutaneously once daily  Orders: TLB-BMP (Basic Metabolic Panel-BMET) (80048-METABOL) TLB-A1C / Hgb A1C (Glycohemoglobin) (83036-A1C) TLB-Lipid Panel (80061-LIPID)  Labs Reviewed: Creat: 1.1 (09/30/2009)    Reviewed HgBA1c results: 7.2 (09/30/2009)  6.1 (03/25/2009) stable overall by hx and exam, ok to continue meds/tx as is   Problem # 2:  HYPERTENSION (ICD-401.9)  His updated medication list for this problem includes:    Hyzaar 100-25 Mg Tabs (Losartan potassium-hctz) .[name]... Take 1 tablet by mouth once a day    Clonidine Hcl 0.2 Mg Tabs (Clonidine hcl) .[name]... Take 1 tablet by mouth two times a day    Toprol Xl 200 Mg Tb24 (Metoprolol succinate) .[name]... Take 1 tablet by mouth once a day    Amlodipine Besylate 10 Mg Tabs (Amlodipine besylate) .[name]... 1po once daily    Furosemide 40 Mg Tabs (Furosemide) .[name]... 1po once daily  BP today: 110/88 Prior BP: 128/72 (01/13/2010)  Labs Reviewed: K+: 3.7 (09/30/2009) Creat: : 1.1 (09/30/2009)   Chol: 146 (09/30/2009)   HDL: 37.80 (09/30/2009)   LDL: 88 (09/30/2009)   TG: 101.0 (09/30/2009) stable overall by hx and exam, ok to continue meds/tx as is   Problem # 3:  HYPERLIPIDEMIA (ICD-272.4)  His updated medication list for this problem includes:    Pravastatin Sodium 40 Mg Tabs (Pravastatin sodium) .[name]... Take 1 tablet by mouth once a day  Labs Reviewed: SGOT: 30 (09/30/2009)   SGPT: 18 (09/30/2009)   HDL:37.80 (09/30/2009), 31.20 (03/25/2009)  LDL:88 (09/30/2009), 65 (03/25/2009)  Chol:146 (09/30/2009), 125  (03/25/2009)  Trig:101.0 (09/30/2009), 142.0 (03/25/2009) stable overall by hx and exam, ok to continue meds/tx as is   Complete Medication List: 1)  Metformin Hcl 1000 Mg Tabs (Metformin hcl) .... Take 1 tablet by mouth two times a day 2)  Low-dose Aspirin 81 Mg Tabs (Aspirin) .... Take 1 tablet by mouth once a day 3)  Hyzaar 100-25 Mg Tabs (Losartan potassium-hctz) .... Take 1 tablet by mouth once a day 4)  Clonidine Hcl 0.2 Mg Tabs (Clonidine hcl) .... Take 1 tablet by mouth two times a day 5)  Pravastatin Sodium 40 Mg Tabs (Pravastatin sodium) .... Take 1 tablet by mouth once a day 6)  Lantus Solostar 100 Unit/ml Soln (Insulin glargine) .... Zachary units subcutaneously once daily 7)  Klor-con M20 20 Meq Tbcr (Potassium chloride crys cr) .... Take 2 tablet by mouth once a day 8)  Toprol Xl 200 Mg Tb24 (Metoprolol succinate) .... Take 1 tablet by mouth once a day 9)  Amlodipine Besylate 10 Mg Tabs (Amlodipine besylate) .[name].. 1po once daily 10)  Bd Insulin Syringe Ultrafine 31g X 5/16" 1 Ml Misc (Insulin syringe-needle u-100) .... Use as directed for blood sugar 11)  Vitamin D3 1000 Unit Tabs (Cholecalciferol) .[name].. 1 by mouth once daily 30 12)  Warfarin Sodium 10 Mg Tabs (Warfarin sodium) .... Use as directed by anticoagulation clinic 13)  Furosemide 40 Mg Tabs (Furosemide) .[name].. 1po once daily  Other Orders: Administration Flu vaccine - MCR (G0008) Flu Vaccine 75yrs + MEDICARE PATIENTS (W1191)  Patient Instructions: 1)  Continue all previous medications as before this visit  2)  Please go to the Lab in the basement for your blood and/or urine tests today 3)  Please call the number on the [facility] Card for results of your testing  4)  Please schedule a follow-up appointment in 6 months.   Orders Added: 1)  Administration Flu vaccine - MCR [G0008] 2)  Flu Vaccine 52yrs + MEDICARE PATIENTS [Q2039] 3)  TLB-BMP (Basic Metabolic Panel-BMET) [80048-METABOL] 4)  TLB-A1C / Hgb A1C (Glycohemoglobin)  [83036-A1C] 5)  TLB-Lipid Panel [80061-LIPID] 6)  Est. Patient Level IV [47829]   Immunizations Administered:  Influenza Vaccine #  1:    Vaccine Type: Fluvax 3+    Site: left deltoid    Mfr: Sanofi Pasteur    Dose: 0.5 ml    Route: IM    Given by: Zella Ball Ewing CMA (AAMA)    Exp. Date: 11/20/2010    Lot #: [id number]    VIS given: 12/15/09 version given April 12, 2010.  Flu Vaccine Consent Questions:    Do you have a history of severe allergic reactions to this vaccine? no    Any prior history of allergic reactions to egg and/or gelatin? no    Do you have a sensitivity to the preservative Thimersol? no    Do you have a past history of Guillan-Barre Syndrome? no    Do you currently have an acute febrile illness? no    Have you ever had a severe reaction to latex? no    Vaccine information given and explained to patient? yes   Immunizations Administered:  Influenza Vaccine # 1:    Vaccine Type: Fluvax 3+    Site: left deltoid    Mfr: Sanofi Pasteur    Dose: 0.5 ml    Route: IM    Given by: Zella Ball Ewing CMA (AAMA)    Exp. Date: 11/20/2010    Lot #: [id number]    VIS given: 12/15/09 version given April 12, 2010.

## 2010-06-22 NOTE — Medication Information (Signed)
Summary: rov/sp  Anticoagulant Therapy  Managed by: Lyna Poser, PharmD Referring MD: Arvilla Meres PCP: [name] MD Supervising MD: Johney Frame MD, Fayrene Fearing Indication 1: Aortic Valve Replacement (ICD-V43.3) Indication 2: Aortic Valve Disorder (ICD-424.1) Lab Used: [facility] Site: [facility] INR POC 2.6 INR RANGE 2 - 3  Dietary changes: no    Health status changes: no    Bleeding/hemorrhagic complications: no    Recent/future hospitalizations: no    Any changes in medication regimen? no    Recent/future dental: no  Any missed doses?: no       Is patient compliant with meds? yes       Allergies: No Known Drug Allergies  Anticoagulation Management History:      Positive risk factors for bleeding include history of CVA/TIA and presence of serious comorbidities.  Negative risk factors for bleeding include an age less than 11 years old.  The bleeding index is 'intermediate risk'.  Positive CHADS2 values include History of HTN, History of Diabetes, and Prior Stroke/CVA/TIA.  Negative CHADS2 values include Age > 55 years old.  The start date was 01/11/2006.  His last INR was 2.5.  Anticoagulation responsible Holliday Sheaffer: Allred MD, Fayrene Fearing.  INR POC: 2.6.  Exp: 02/2011.    Anticoagulation Management Assessment/Plan:      The patient's current anticoagulation dose is Warfarin sodium 10 mg tabs: Use as directed by anticoagulation clinic.  The target INR is 2.0-3.0.  The next INR is due 03/10/2010.  Anticoagulation instructions were given to patient.  Results were reviewed/authorized by Lyna Poser, PharmD.  He was notified by Weston Brass PharmD.         Prior Anticoagulation Instructions: INR 3.0  Continue same dose of 1/2 tablet every day except 1 tablet on Monday.  Recheck INR in 4 weeks.   Current Anticoagulation Instructions: INR 2.6 No changes today. Continue taking a half tablet everyday except monday. On monday take a whole tablet. Recheck in 4 weeks.

## 2010-06-22 NOTE — Medication Information (Signed)
Summary: rov/tm  Anticoagulant Therapy  Managed by: Weston Brass, PharmD Referring MD: Arvilla Meres Supervising MD: Ladona Ridgel MD, Sharlot Gowda Indication 1: Aortic Valve Replacement (ICD-V43.3) Indication 2: Aortic Valve Disorder (ICD-424.1) Lab Used: [facility] Site: [facility] INR POC 1.8 INR RANGE 2 - 3  Dietary changes: no    Health status changes: no    Bleeding/hemorrhagic complications: no    Recent/future hospitalizations: no    Any changes in medication regimen? no    Recent/future dental: no  Any missed doses?: no       Is patient compliant with meds? yes       Allergies: No Known Drug Allergies  Anticoagulation Management History:      The patient is taking warfarin and comes in today for a routine follow up visit.  Positive risk factors for bleeding include history of CVA/TIA and presence of serious comorbidities.  Negative risk factors for bleeding include an age less than 45 years old.  The bleeding index is 'intermediate risk'.  Positive CHADS2 values include History of HTN, History of Diabetes, and Prior Stroke/CVA/TIA.  Negative CHADS2 values include Age > 32 years old.  The start date was 01/11/2006.  His last INR was 2.5.  Anticoagulation responsible provider: Ladona Ridgel MD, Sharlot Gowda.  INR POC: 1.8.  Cuvette Lot#: [id number].  Exp: 01/2011.    Anticoagulation Management Assessment/Plan:      The patient's current anticoagulation dose is Warfarin sodium 10 mg tabs: Use as directed by anticoagulation clinic.  The target INR is 2.0-3.0.  The next INR is due 12/10/2009.  Anticoagulation instructions were given to patient.  Results were reviewed/authorized by Weston Brass, PharmD.  He was notified by Weston Brass PharmD.         Prior Anticoagulation Instructions: INR 1.4 Today take 10mg s then resume 5mg s everyday except 10mg s on Mondays. Recheck in 2 weeks.   Current Anticoagulation Instructions: INR 1.8  Take 1 tab (10mg ) today.  Continue same regimen of 10mg  (1 tab) on  Monday and 5mg  (1/2 tab) all other days.

## 2010-06-22 NOTE — Medication Information (Signed)
Summary: ccr/saf  Anticoagulant Therapy  Managed by: Leota Sauers, PharmD, BCPS, CPP Referring MD: Arvilla Meres PCP: [name] MD Supervising MD: Gala Romney MD, Reuel Boom Indication 1: Aortic Valve Replacement (ICD-V43.3) Indication 2: Aortic Valve Disorder (ICD-424.1) Lab Used: [facility] Site: [facility] INR POC 2.1 INR RANGE 2 - 3  Dietary changes: yes       Details: loosong wt by exercise and healthy eating - inc salads -lost 26 lbs  Health status changes: no    Bleeding/hemorrhagic complications: no    Recent/future hospitalizations: no    Any changes in medication regimen? no    Recent/future dental: no  Any missed doses?: no       Is patient compliant with meds? yes      Comments: in hospital with cp last week - muscle strain INR 1.7 in hospital - coumadin 10mg  x3 days  Current Medications (verified): 1)  Metformin Hcl 1000 Mg  Tabs (Metformin Hcl) .... Take 1 Tablet By Mouth Two Times A Day 2)  Low-Dose Aspirin 81 Mg  Tabs (Aspirin) .... Take 1 Tablet By Mouth Once A Day 3)  Hyzaar 100-25 Mg Tabs (Losartan Potassium-Hctz) .... Take 1 Tablet By Mouth Once A Day 4)  Clonidine Hcl 0.2 Mg  Tabs (Clonidine Hcl) .... Take 1 Tablet By Mouth Two Times A Day 5)  Pravastatin Sodium 40 Mg  Tabs (Pravastatin Sodium) .... Take 1 Tablet By Mouth Once A Day 6)  Lantus Solostar 100 Unit/ml  Soln (Insulin Glargine) .... 75 Units Subcutaneously Once Daily 7)  Klor-Con M20 20 Meq  Tbcr (Potassium Chloride Crys Cr) .... Take 2 Tablet By Mouth Once A Day 8)  Toprol Xl 200 Mg  Tb24 (Metoprolol Succinate) .... Take 1 Tablet By Mouth Once A Day 9)  Amlodipine Besylate 10 Mg Tabs (Amlodipine Besylate) .[name].. 1po Once Daily 10)  Bd Insulin Syringe Ultrafine 31g X 5/16" 1 Ml  Misc (Insulin Syringe-Needle U-100) .... Use As Directed For Blood Sugar 11)  Vitamin D3 1000 Unit  Tabs (Cholecalciferol) .[name].. 1 By Mouth Once Daily 30 12)  Warfarin Sodium 10 Mg Tabs (Warfarin Sodium) .... Use As  Directed By Anticoagulation Clinic 13)  Furosemide 40 Mg Tabs (Furosemide) .[name].. 1po Once Daily  Allergies (verified): No Known Drug Allergies  Anticoagulation Management History:      The patient is taking warfarin and comes in today for a routine follow up visit.  Positive risk factors for bleeding include history of CVA/TIA and presence of serious comorbidities.  Negative risk factors for bleeding include an age less than 4 years old.  The bleeding index is 'intermediate risk'.  Positive CHADS2 values include History of HTN, History of Diabetes, and Prior Stroke/CVA/TIA.  Negative CHADS2 values include Age > 16 years old.  The start date was 01/11/2006.  His last INR was 2.5.  Anticoagulation responsible provider: Monaye Blackie MD, Reuel Boom.  INR POC: 2.1.  Cuvette Lot#: [id number].  Exp: 02/2011.    Anticoagulation Management Assessment/Plan:      The patient's current anticoagulation dose is Warfarin sodium 10 mg tabs: Use as directed by anticoagulation clinic.  The target INR is 2.0-3.0.  The next INR is due 01/13/2010.  Anticoagulation instructions were given to patient.  Results were reviewed/authorized by Leota Sauers, PharmD, BCPS, CPP.         Prior Anticoagulation Instructions: INR 2.3 Continue 5mg s daily except 10mg s on Mondays. Recheck in 4 weeks  Current Anticoagulation Instructions: INR 2.1  coumadin 10mg  on Mon and  1/2 tab all other days. recheck INR next Wednesday 01/13/10 when seeing Dr Gala Romney

## 2010-06-22 NOTE — Assessment & Plan Note (Signed)
Summary: 6 mos f/u #/cd   Vital Signs:  Patient profile:   45 year old male Height:      74 inches Weight:      331 pounds BMI:     42.65 O2 Sat:      96 % on Room air Temp:     97.7 degrees F oral Pulse rate:   75 / minute BP sitting:   118 / 80  (left arm) Cuff size:   large  Vitals Entered By: Bill Salinas CMA (Sep 30, 2009 10:06 AM)  O2 Flow:  Room air  CC: pt here for 6 month follow up/ ab   CC:  pt here for 6 month follow up/ ab.  History of Present Illness: here with mild worsening LE edema, gradual over the past month; has been drinking more fluids while working out at [employer];  Pt denies CP, sob, doe, wheezing, orthopnea, pnd, palps, dizziness or syncope  Pt denies new neuro symptoms such as headache, facial or extremity weakness  Pt denies polydipsia, polyuria, or low sugar symptoms such as shakiness improved with eating.  Overall good compliance with meds, trying to follow low chol, DM diet, wt stable, little excercise however , although he is starting to work out at [employer].    Problems Prior to Update: 1)  Coumadin Therapy  (ICD-V58.61) 2)  Aortic Valve Disorders  (ICD-424.1) 3)  Rash-nonvesicular  (ICD-782.1) 4)  Gross Hematuria  (ICD-599.71) 5)  Unspecified Vitamin D Deficiency  (ICD-268.9) 6)  Special Screening Malignant Neoplasm of Prostate  (ICD-V76.44) 7)  Fatigue  (ICD-780.79) 8)  Preventive Health Care  (ICD-V70.0) 9)  Transient Ischemic Attack, Hx of  (ICD-V12.50) 10)  Hyperlipidemia  (ICD-272.4) 11)  Family History Diabetes 1st Degree Relative  (ICD-V18.0) 12)  Family History of Cad Male 1st Degree Relative <50  (ICD-V17.3) 13)  Tia  (ICD-435.9) 14)  Hx of Nephrolithiasis, Hx of  (ICD-V13.01) 15)  Diabetes Mellitus, Type II  (ICD-250.00) 16)  Hypertension  (ICD-401.9) 17)  Cad  (ICD-414.00)  Medications Prior to Update: 1)  Metformin Hcl 1000 Mg  Tabs (Metformin Hcl) .... Take 1 Tablet By Mouth Two Times A Day 2)  Low-Dose Aspirin 81 Mg  Tabs  (Aspirin) .... Take 1 Tablet By Mouth Once A Day 3)  Hyzaar 100-25 Mg  Tabs (Losartan Potassium-Hctz) .... Take 1 Tablet By Mouth Once A Day 4)  Clonidine Hcl 0.2 Mg  Tabs (Clonidine Hcl) .... Take 1 Tablet By Mouth Two Times A Day 5)  Pravastatin Sodium 40 Mg  Tabs (Pravastatin Sodium) .... Take 1 Tablet By Mouth Once A Day 6)  Lantus Solostar 100 Unit/ml  Soln (Insulin Glargine) .... 75 Units Subcutaneously Once Daily 7)  Klor-Con M20 20 Meq  Tbcr (Potassium Chloride Crys Cr) .... Take 2 Tablet By Mouth Once A Day 8)  Toprol Xl 200 Mg  Tb24 (Metoprolol Succinate) .... Take 1 Tablet By Mouth Once A Day 9)  Amlodipine Besylate 10 Mg Tabs (Amlodipine Besylate) .[name].. 1po Once Daily 10)  Bd Insulin Syringe Ultrafine 31g X 5/16" 1 Ml  Misc (Insulin Syringe-Needle U-100) .... Use As Directed For Blood Sugar 11)  Vitamin D3 1000 Unit  Tabs (Cholecalciferol) .[name].. 1 By Mouth Once Daily 30 12)  Norvasc 10 Mg Tabs (Amlodipine Besylate) .... Take 1 Tablet By Mouth Once A Day 13)  Warfarin Sodium 10 Mg Tabs (Warfarin Sodium) .... Use As Directed By Anticoagulation Clinic  Current Medications (verified): 1)  Metformin Hcl  1000 Mg  Tabs (Metformin Hcl) .... Take 1 Tablet By Mouth Two Times A Day 2)  Low-Dose Aspirin 81 Mg  Tabs (Aspirin) .... Take 1 Tablet By Mouth Once A Day 3)  Losartan Potassium 100 Mg Tabs (Losartan Potassium) .[name].. 1po Once Daily 4)  Clonidine Hcl 0.2 Mg  Tabs (Clonidine Hcl) .... Take 1 Tablet By Mouth Two Times A Day 5)  Pravastatin Sodium 40 Mg  Tabs (Pravastatin Sodium) .... Take 1 Tablet By Mouth Once A Day 6)  Lantus Solostar 100 Unit/ml  Soln (Insulin Glargine) .... 75 Units Subcutaneously Once Daily 7)  Klor-Con M20 20 Meq  Tbcr (Potassium Chloride Crys Cr) .... Take 2 Tablet By Mouth Once A Day 8)  Toprol Xl 200 Mg  Tb24 (Metoprolol Succinate) .... Take 1 Tablet By Mouth Once A Day 9)  Amlodipine Besylate 10 Mg Tabs (Amlodipine Besylate) .[name].. 1po Once Daily 10)  Bd Insulin  Syringe Ultrafine 31g X 5/16" 1 Ml  Misc (Insulin Syringe-Needle U-100) .... Use As Directed For Blood Sugar 11)  Vitamin D3 1000 Unit  Tabs (Cholecalciferol) .[name].. 1 By Mouth Once Daily 30 12)  Warfarin Sodium 10 Mg Tabs (Warfarin Sodium) .... Use As Directed By Anticoagulation Clinic 13)  Furosemide 40 Mg Tabs (Furosemide) .[name].. 1po Once Daily  Allergies (verified): No Known Drug Allergies  Past History:  Past Medical History: Last updated: 08/07/2008 Coronary artery disease, status post CABG in the year 2000 x2.  Status     post aortic valve repair  History of aortic valve dissection Anticoagulation Hypertension. Type 2 diabetes. History of nephrolithiasis Transient ischemic attack, hx of Hyperlipidemia vit d deficiency  Past Surgical History: Last updated: 07/29/2008 Appendectomy 1998 Coronary artery bypass graft -- 2000  Family History: Last updated: 12/26/2007 Family History of CAD Male 1st degree relative <50 - father with cabg in his 53's Family History Diabetes 1st degree relative- mother Family History Hypertension - mother brother with DM  Social History: Last updated: 09/30/2009 Single - separated 4 children Disabled Never Smoked Alcohol use-no Drug use-no Tobacco Use - No.   Risk Factors: Smoking Status: never (07/29/2008)  Social History: Single - separated 4 children Disabled Never Smoked Alcohol use-no Drug use-no Tobacco Use - No.   Review of Systems  The patient denies anorexia, fever, weight loss, vision loss, decreased hearing, hoarseness, chest pain, syncope, dyspnea on exertion, peripheral edema, prolonged cough, headaches, hemoptysis, abdominal pain, melena, hematochezia, severe indigestion/heartburn, hematuria, muscle weakness, suspicious skin lesions, transient blindness, difficulty walking, depression, unusual weight change, abnormal bleeding, enlarged lymph nodes, angioedema, and breast masses.         all otherwise negative per pt  -    Physical Exam  General:  alert and overweight-appearing.   Head:  normocephalic and atraumatic.   Eyes:  vision grossly intact, pupils equal, and pupils round.   Ears:  R ear normal and L ear normal.   Nose:  no external deformity and no nasal discharge.   Mouth:  no gingival abnormalities and pharynx pink and moist.   Neck:  supple and no masses.   Lungs:  normal respiratory effort and normal breath sounds.   Heart:  normal rate and regular rhythm.   Abdomen:  soft, non-tender, and normal bowel sounds.   Msk:  no joint tenderness and no joint swelling.   Extremities:  , no erythema but trace to 1+ bilat ankle edema Neurologic:  cranial nerves II-XII intact and strength normal in all extremities.   Skin:  color normal and no rashes.   Psych:  not anxious appearing and not depressed appearing.     Impression & Recommendations:  Problem # 1:  Preventive Health Care (ICD-V70.0)  Overall doing well, age appropriate education and counseling updated and referral for appropriate preventive services done unless declined, immunizations up to date or declined, diet counseling done if overweight, urged to quit smoking if smokes , most recent labs reviewed and current ordered if appropriate, ecg reviewed or declined (interpretation per ECG scanned in the EMR if done); information regarding Medicare Prevention requirements given if appropriate; speciality referrals updated as appropriate   Orders: EKG w/ Interpretation (93000) TLB-BMP (Basic Metabolic Panel-BMET) (80048-METABOL) TLB-CBC Platelet - w/Differential (85025-CBCD) TLB-Hepatic/Liver Function Pnl (80076-HEPATIC) TLB-Lipid Panel (80061-LIPID) TLB-TSH (Thyroid Stimulating Hormone) (84443-TSH) TLB-PSA (Prostate Specific Antigen) (84153-PSA) TLB-Udip ONLY (81003-UDIP)  Problem # 2:  CAD (ICD-414.00)  The following medications were removed from the medication list:    Norvasc 10 Mg Tabs (Amlodipine besylate) .[name]... Take 1 tablet by  mouth once a day His updated medication list for this problem includes:    Low-dose Aspirin 81 Mg Tabs (Aspirin) .[name]... Take 1 tablet by mouth once a day    Losartan Potassium 100 Mg Tabs (Losartan potassium) .[name]... 1po once daily    Clonidine Hcl 0.2 Mg Tabs (Clonidine hcl) .[name]... Take 1 tablet by mouth two times a day    Toprol Xl 200 Mg Tb24 (Metoprolol succinate) .[name]... Take 1 tablet by mouth once a day    Amlodipine Besylate 10 Mg Tabs (Amlodipine besylate) .[name]... 1po once daily    Furosemide 40 Mg Tabs (Furosemide) .[name]... 1po once daily stable overall by hx and exam, ok to continue meds/tx as is, but needs card f/u - will refer , as well f/u yearly echo  Orders: Cardiology Referral (Cardiology)  Problem # 3:  DIABETES MELLITUS, TYPE II (ICD-250.00)  His updated medication list for this problem includes:    Metformin Hcl 1000 Mg Tabs (Metformin hcl) .[name]... Take 1 tablet by mouth two times a day    Low-dose Aspirin 81 Mg Tabs (Aspirin) .[name]... Take 1 tablet by mouth once a day    Losartan Potassium 100 Mg Tabs (Losartan potassium) .[name]... 1po once daily    Lantus Solostar 100 Unit/ml Soln (Insulin glargine) .Marland KitchenMarland KitchenMarland KitchenMarland Kitchen 75 units subcutaneously once daily  Orders: TLB-A1C / Hgb A1C (Glycohemoglobin) (83036-A1C) TLB-Microalbumin/Creat Ratio, Urine (82043-MALB)  Labs Reviewed: Creat: 1.1 (03/25/2009)    Reviewed HgBA1c results: 6.1 (03/25/2009)  7.1 (08/07/2008) stable overall by hx and exam, ok to continue meds/tx as is   Problem # 4:  HYPERTENSION (ICD-401.9)  The following medications were removed from the medication list:    Norvasc 10 Mg Tabs (Amlodipine besylate) .[name]... Take 1 tablet by mouth once a day His updated medication list for this problem includes:    Losartan Potassium 100 Mg Tabs (Losartan potassium) .[name]... 1po once daily    Clonidine Hcl 0.2 Mg Tabs (Clonidine hcl) .[name]... Take 1 tablet by mouth two times a day    Toprol Xl 200 Mg Tb24 (Metoprolol succinate) .[name]... Take 1 tablet by  mouth once a day    Amlodipine Besylate 10 Mg Tabs (Amlodipine besylate) .[name]... 1po once daily    Furosemide 40 Mg Tabs (Furosemide) .[name]... 1po once daily  BP today: 118/80 Prior BP: 130/80 (03/25/2009)  Labs Reviewed: K+: 3.5 (03/25/2009) Creat: : 1.1 (03/25/2009)   Chol: 125 (03/25/2009)   HDL: 31.20 (03/25/2009)   LDL: 65 (03/25/2009)   TG:  142.0 (03/25/2009) stable overall by hx and exam, ok to continue meds/tx as is  - to change the losartan HCT to losartan, add the lasix due to the ankle edema  Complete Medication List: 1)  Metformin Hcl 1000 Mg Tabs (Metformin hcl) .... Take 1 tablet by mouth two times a day 2)  Low-dose Aspirin 81 Mg Tabs (Aspirin) .... Take 1 tablet by mouth once a day 3)  Losartan Potassium 100 Mg Tabs (Losartan potassium) .[name].. 1po once daily 4)  Clonidine Hcl 0.2 Mg Tabs (Clonidine hcl) .... Take 1 tablet by mouth two times a day 5)  Pravastatin Sodium 40 Mg Tabs (Pravastatin sodium) .... Take 1 tablet by mouth once a day 6)  Lantus Solostar 100 Unit/ml Soln (Insulin glargine) .... 75 units subcutaneously once daily 7)  Klor-con M20 20 Meq Tbcr (Potassium chloride crys cr) .... Take 2 tablet by mouth once a day 8)  Toprol Xl 200 Mg Tb24 (Metoprolol succinate) .... Take 1 tablet by mouth once a day 9)  Amlodipine Besylate 10 Mg Tabs (Amlodipine besylate) .[name].. 1po once daily 10)  Bd Insulin Syringe Ultrafine 31g X 5/16" 1 Ml Misc (Insulin syringe-needle u-100) .... Use as directed for blood sugar 11)  Vitamin D3 1000 Unit Tabs (Cholecalciferol) .[name].. 1 by mouth once daily 30 12)  Warfarin Sodium 10 Mg Tabs (Warfarin sodium) .... Use as directed by anticoagulation clinic 13)  Furosemide 40 Mg Tabs (Furosemide) .[name].. 1po once daily  Other Orders: Echo Referral (Echo) TLB-IBC Pnl (Iron/FE;Transferrin) (83550-IBC) TLB-B12 + Folate Pnl (82746_82607-B12/FOL)  Patient Instructions: 1)  You will be contacted about the referral(s) to: Cardiology, and the  echocardiogram 2)  your EKG was good today 3)  Please go to the Lab in the basement for your blood and/or urine tests today  4)  stop the losartan-HCTZ 5)  start the losartan 100 mg per day 6)  start the furosemide 40 mg per day (the stronger fluid pill) 7)  Continue all previous medications as before this visit  8)  Please schedule a follow-up appointment in 6 months with : 9)  BMP prior to visit, ICD-9: 250.02 10)  Lipid Panel prior to visit, ICD-9: 11)  HbgA1C prior to visit, ICD-9: 12)  Please continue as you do with the Coumadin Clinic Prescriptions: FUROSEMIDE 40 MG TABS (FUROSEMIDE) 1po once daily  #30 x 11   Entered and Authorized by:   [name] MD   Signed by:   [name] MD on 09/30/2009   Method used:   Print then Give to Patient   RxID:   [id number] POTASSIUM 100 MG TABS (LOSARTAN POTASSIUM) 1po once daily  #30 x 11   Entered and Authorized by:   [name] MD   Signed by:   [name] MD on 09/30/2009   Method used:   Print then Give to Patient   RxID:   [id number]

## 2010-06-22 NOTE — Assessment & Plan Note (Signed)
Summary: per walk in/saf   Visit Type:  Follow-up Primary Provider:  [name] MD  CC:  no complaints.  History of Present Illness: [name] is a very pleasant 45 year old male with a history of spontaneous ascending aortic dissection with aortic valve involvement status post mechanical AVR with ascending aorta conduit in 2000.  He also has a history of morbid obesity, hypertension, diabetes, and hyperlipidemia.    He returns today for 2-yrfollowup.  He is feeling well.  Goes to gym 6 days/week on treadmill and weights.  (not doing heavy weights).  He has not had any problems with heart failure symptoms or CP. Has lost 16 pounds. Mild LE dependent edema improved with lasix.   Echo in May. EF 55-60% AVR working well mean (moderate AS, mild AI) Mild MR. (no change from last year).   Current Medications (verified): 1)  Metformin Hcl 1000 Mg  Tabs (Metformin Hcl) .... Take 1 Tablet By Mouth Two Times A Day 2)  Low-Dose Aspirin 81 Mg  Tabs (Aspirin) .... Take 1 Tablet By Mouth Once A Day 3)  Hyzaar 100-25 Mg Tabs (Losartan Potassium-Hctz) .... Take 1 Tablet By Mouth Once A Day 4)  Clonidine Hcl 0.2 Mg  Tabs (Clonidine Hcl) .... Take 1 Tablet By Mouth Two Times A Day 5)  Pravastatin Sodium 40 Mg  Tabs (Pravastatin Sodium) .... Take 1 Tablet By Mouth Once A Day 6)  Lantus Solostar 100 Unit/ml  Soln (Insulin Glargine) .... 75 Units Subcutaneously Once Daily 7)  Klor-Con M20 20 Meq  Tbcr (Potassium Chloride Crys Cr) .... Take 2 Tablet By Mouth Once A Day 8)  Toprol Xl 200 Mg  Tb24 (Metoprolol Succinate) .... Take 1 Tablet By Mouth Once A Day 9)  Amlodipine Besylate 10 Mg Tabs (Amlodipine Besylate) .[name].. 1po Once Daily 10)  Bd Insulin Syringe Ultrafine 31g X 5/16" 1 Ml  Misc (Insulin Syringe-Needle U-100) .... Use As Directed For Blood Sugar 11)  Vitamin D3 1000 Unit  Tabs (Cholecalciferol) .[name].. 1 By Mouth Once Daily 30 12)  Warfarin Sodium 10 Mg Tabs (Warfarin Sodium) .... Use As Directed By  Anticoagulation Clinic 13)  Furosemide 40 Mg Tabs (Furosemide) .[name].. 1po Once Daily  Allergies (verified): No Known Drug Allergies  Past History:  Past Medical History: Ascending aortic dissection   --s/p AVR (mechanical) with re-implant of coronaries 2000   --coronaries on cath 2000 Anticoagulation Hypertension. Type 2 diabetes. History of nephrolithiasis Transient ischemic attack, hx of Hyperlipidemia vit d deficiency  Review of Systems       As per HPI and past medical history; otherwise all systems negative.   Vital Signs:  Patient profile:   45 year old male Height:      74 inches Weight:      321 pounds BMI:     41.36 Pulse rate:   70 / minute BP sitting:   126 / 86  (right arm) Cuff size:   large  Vitals Entered By: Hardin Negus, RMA (December 17, 2009 12:12 PM)  Physical Exam  General:  HEENT:  Sclerae are anicteric.  EOMI.  There are no xanthelasmas. Mucous membranes are moist. NECK:  The neck is supple, no JVD.  Carotids are 2+ bilaterally with bilateral radiated bruits off his aortic valve.  No lymphadenopathy or thyromegaly. CARDIAC:  He has a regular rate and rhythm.  There is a 3/6 systolic murmur across the aortic valve.  There is a mechanical click, which is crisp. No AI.  No  rub or gallop. LUNGS:  Clear. ABDOMEN:  Obese, nontender, nondistended.  No apparent hepatosplenomegaly.  No bruits, no masses. EXTREMITIES:  Warm with no cyanosis, clubbing or edema, good distal pulses.  He does have an olecranon bursitis on his left elbow. NEUROLOGIC:  Alert and oriented times three.  Cranial nerves II through XII are intact.  Moves all four extremities without difficulty.    Impression & Recommendations:  Problem # 1:  AORTIC VALVE DISORDERS (ICD-424.1) Doing well s/p AVR. F/u in 1 year with echo.   Other Orders: EKG w/ Interpretation (93000)  Patient Instructions: 1)  Your physician recommends that you schedule a follow-up appointment in: 1 year with  Dr Gala Romney 2)  Your physician recommends that you continue on your current medications as directed. Please refer to the Current Medication list given to you today.

## 2010-06-22 NOTE — Medication Information (Signed)
Summary: rov/tm  Anticoagulant Therapy  Managed by: Weston Brass, PharmD Referring MD: Arvilla Meres Supervising MD: Daleen Squibb MD, Maisie Fus Indication 1: Aortic Valve Replacement (ICD-V43.3) Indication 2: Aortic Valve Disorder (ICD-424.1) Lab Used: [facility] Site: [facility] INR POC 3.6 INR RANGE 2 - 3  Dietary changes: yes       Details: increased greens  Health status changes: no    Bleeding/hemorrhagic complications: no    Recent/future hospitalizations: no    Any changes in medication regimen? no    Recent/future dental: no  Any missed doses?: no       Is patient compliant with meds? yes       Allergies: No Known Drug Allergies  Anticoagulation Management History:      The patient is taking warfarin and comes in today for a routine follow up visit.  Positive risk factors for bleeding include history of CVA/TIA and presence of serious comorbidities.  Negative risk factors for bleeding include an age less than 6 years old.  The bleeding index is 'intermediate risk'.  Positive CHADS2 values include History of HTN, History of Diabetes, and Prior Stroke/CVA/TIA.  Negative CHADS2 values include Age > 53 years old.  The start date was 01/11/2006.  His last INR was 2.5.  Anticoagulation responsible provider: Daleen Squibb MD, Maisie Fus.  INR POC: 3.6.  Exp: 09/2010.    Anticoagulation Management Assessment/Plan:      The patient's current anticoagulation dose is Warfarin sodium 10 mg tabs: Use as directed by anticoagulation clinic.  The target INR is 2.0-3.0.  The next INR is due 10/08/2009.  Anticoagulation instructions were given to patient.  Results were reviewed/authorized by Weston Brass, PharmD.  He was notified by Weston Brass PharmD.         Prior Anticoagulation Instructions: INR 3.2 Skip today dose the resume 5mg s daily except 10mg s on Mondays. Recheck in 3 weeks.  Current Anticoagulation Instructions: INR 3.6  Skip today's dose of Coumadin then decrease dose to 5mg  daily

## 2010-06-22 NOTE — Medication Information (Signed)
Summary: rov/mw  Anticoagulant Therapy  Managed by: Weston Brass, PharmD Referring MD: Arvilla Meres PCP: [name] MD Supervising MD: Myrtis Ser MD, Tinnie Gens Indication 1: Aortic Valve Replacement (ICD-V43.3) Indication 2: Aortic Valve Disorder (ICD-424.1) Lab Used: [facility] Site: [facility] INR POC 2.5 INR RANGE 2 - 3  Dietary changes: no    Health status changes: no    Bleeding/hemorrhagic complications: no    Recent/future hospitalizations: no    Any changes in medication regimen? no    Recent/future dental: no  Any missed doses?: no       Is patient compliant with meds? yes       Allergies: No Known Drug Allergies  Anticoagulation Management History:      The patient is taking warfarin and comes in today for a routine follow up visit.  Positive risk factors for bleeding include history of CVA/TIA and presence of serious comorbidities.  Negative risk factors for bleeding include an age less than 56 years old.  The bleeding index is 'intermediate risk'.  Positive CHADS2 values include History of HTN, History of Diabetes, and Prior Stroke/CVA/TIA.  Negative CHADS2 values include Age > 69 years old.  The start date was 01/11/2006.  His last INR was 2.5.  Anticoagulation responsible Abbagayle Zaragoza: Myrtis Ser MD, Tinnie Gens.  INR POC: 2.5.  Cuvette Lot#: [id number].  Exp: 03/2011.    Anticoagulation Management Assessment/Plan:      The patient's current anticoagulation dose is Warfarin sodium 10 mg tabs: Use as directed by anticoagulation clinic.  The target INR is 2.0-3.0.  The next INR is due 04/07/2010.  Anticoagulation instructions were given to patient.  Results were reviewed/authorized by Weston Brass, PharmD.  He was notified by Ilean Skill D candidate.         Prior Anticoagulation Instructions: INR 2.6 No changes today. Continue taking a half tablet everyday except monday. On monday take a whole tablet. Recheck in 4 weeks.  Current Anticoagulation Instructions: INR  2.5  Continue taking 1/2 tablet everyday except 1 tablet on Monday. Recheck in 4 weeks.

## 2010-06-22 NOTE — Medication Information (Signed)
Summary: rov/jm  Anticoagulant Therapy  Managed by: Bethena Midget, RN, BSN Referring MD: Arvilla Meres Supervising MD: Ladona Ridgel MD, Sharlot Gowda Indication 1: Aortic Valve Replacement (ICD-V43.3) Indication 2: Aortic Valve Disorder (ICD-424.1) Lab Used: [facility] Site: [facility] INR POC 2.7 INR RANGE 2 - 3  Dietary changes: no    Health status changes: no    Bleeding/hemorrhagic complications: no    Recent/future hospitalizations: no    Any changes in medication regimen? no    Recent/future dental: no  Any missed doses?: no       Is patient compliant with meds? yes       Allergies: No Known Drug Allergies  Anticoagulation Management History:      The patient is taking warfarin and comes in today for a routine follow up visit.  Positive risk factors for bleeding include history of CVA/TIA and presence of serious comorbidities.  Negative risk factors for bleeding include an age less than 29 years old.  The bleeding index is 'intermediate risk'.  Positive CHADS2 values include History of HTN, History of Diabetes, and Prior Stroke/CVA/TIA.  Negative CHADS2 values include Age > 47 years old.  The start date was 01/11/2006.  His last INR was 2.5.  Anticoagulation responsible provider: Ladona Ridgel MD, Sharlot Gowda.  INR POC: 2.7.  Cuvette Lot#: [id number].  Exp: 08/2010.    Anticoagulation Management Assessment/Plan:      The patient's current anticoagulation dose is Warfarin sodium 10 mg tabs: Use as directed by anticoagulation clinic.  The target INR is 2.0-3.0.  The next INR is due 06/25/2009.  Anticoagulation instructions were given to patient.  Results were reviewed/authorized by Bethena Midget, RN, BSN.  He was notified by Bethena Midget, RN, BSN.         Prior Anticoagulation Instructions: INR 2.5  CONTINUE TO TAKE 1/2 TABLET EVERYDAY EXCEPT TAKE 1 TABLET ON MONDAY.  RECHECK IN 4 WEEKS.   Current Anticoagulation Instructions: INR 2.7 Continue 5mg s daily except 10mg  on Mondays. Recheck in  4 weeks.

## 2010-06-22 NOTE — Medication Information (Signed)
Summary: rov/eac  Anticoagulant Therapy  Managed by: Bethena Midget, RN, BSN Referring MD: Arvilla Meres Supervising MD: Eden Emms MD, Theron Arista Indication 1: Aortic Valve Replacement (ICD-V43.3) Indication 2: Aortic Valve Disorder (ICD-424.1) Lab Used: [facility] Site: [facility] INR POC 1.4 INR RANGE 2 - 3  Dietary changes: yes       Details: Eating more leafy veggies  Health status changes: no    Bleeding/hemorrhagic complications: no    Recent/future hospitalizations: no    Any changes in medication regimen? no    Recent/future dental: no  Any missed doses?: no       Is patient compliant with meds? yes       Allergies: No Known Drug Allergies  Anticoagulation Management History:      The patient is taking warfarin and comes in today for a routine follow up visit.  Positive risk factors for bleeding include history of CVA/TIA and presence of serious comorbidities.  Negative risk factors for bleeding include an age less than 79 years old.  The bleeding index is 'intermediate risk'.  Positive CHADS2 values include History of HTN, History of Diabetes, and Prior Stroke/CVA/TIA.  Negative CHADS2 values include Age > 12 years old.  The start date was 01/11/2006.  His last INR was 2.5.  Anticoagulation responsible provider: Eden Emms MD, Theron Arista.  INR POC: 1.4.  Cuvette Lot#: [id number].  Exp: 01/2011.    Anticoagulation Management Assessment/Plan:      The patient's current anticoagulation dose is Warfarin sodium 10 mg tabs: Use as directed by anticoagulation clinic.  The target INR is 2.0-3.0.  The next INR is due 11/26/2009.  Anticoagulation instructions were given to patient.  Results were reviewed/authorized by Bethena Midget, RN, BSN.  He was notified by Bethena Midget, RN, BSN.         Prior Anticoagulation Instructions: INR 2.4  Continue taking 1 tablet on Monday and 1/2 tablet all other days. Return to clinic in 4 weeks.    Current Anticoagulation Instructions: INR 1.4 Today take  10mg s then resume 5mg s everyday except 10mg s on Mondays. Recheck in 2 weeks.

## 2010-06-22 NOTE — Medication Information (Signed)
Summary: rov/eac  Anticoagulant Therapy  Managed by: Bethena Midget, RN, BSN Referring MD: Arvilla Meres Supervising MD: Eden Emms MD, Theron Arista Indication 1: Aortic Valve Replacement (ICD-V43.3) Indication 2: Aortic Valve Disorder (ICD-424.1) Lab Used: [facility] Site: [facility] INR POC 3.2 INR RANGE 2 - 3  Dietary changes: no    Health status changes: no    Bleeding/hemorrhagic complications: no    Recent/future hospitalizations: no    Any changes in medication regimen? no    Recent/future dental: no  Any missed doses?: no       Is patient compliant with meds? yes       Allergies (verified): No Known Drug Allergies  Anticoagulation Management History:      The patient is taking warfarin and comes in today for a routine follow up visit.  Positive risk factors for bleeding include history of CVA/TIA and presence of serious comorbidities.  Negative risk factors for bleeding include an age less than 51 years old.  The bleeding index is 'intermediate risk'.  Positive CHADS2 values include History of HTN, History of Diabetes, and Prior Stroke/CVA/TIA.  Negative CHADS2 values include Age > 27 years old.  The start date was 01/11/2006.  His last INR was 2.5.  Anticoagulation responsible provider: Eden Emms MD, Theron Arista.  INR POC: 3.2.  Cuvette Lot#: [id number].  Exp: 09/2010.    Anticoagulation Management Assessment/Plan:      The patient's current anticoagulation dose is Warfarin sodium 10 mg tabs: Use as directed by anticoagulation clinic.  The target INR is 2.0-3.0.  The next INR is due 09/17/2009.  Anticoagulation instructions were given to patient.  Results were reviewed/authorized by Bethena Midget, RN, BSN.  He was notified by Bethena Midget, RN, BSN.         Prior Anticoagulation Instructions: INR 2.9  continue taking 1 tablet (10 mg) on Monday, and 1/2 tablet (5 mg) all other days.  Return to clinic in 4 weeks.   Current Anticoagulation Instructions: INR 3.2 Skip today dose the  resume 5mg s daily except 10mg s on Mondays. Recheck in 3 weeks.

## 2010-06-22 NOTE — Medication Information (Signed)
Summary: rov/tm  Anticoagulant Therapy  Managed by: Bethena Midget, RN, BSN Referring MD: Arvilla Meres Supervising MD: Daleen Squibb MD, Maisie Fus Indication 1: Aortic Valve Replacement (ICD-V43.3) Indication 2: Aortic Valve Disorder (ICD-424.1) [facility]  Site: [address] INR POC 3.1 INR RANGE 2 - 3  Dietary changes: no    Health status changes: no    Bleeding/hemorrhagic complications: no    Recent/future hospitalizations: no    Any changes in medication regimen? no    Recent/future dental: no  Any missed doses?: no       Is patient compliant with meds? yes       Allergies: No Known Drug Allergies  Anticoagulation Management History:      The patient is taking warfarin and comes in today for a routine follow up visit.  Positive risk factors for bleeding include history of CVA/TIA and presence of serious comorbidities.  Negative risk factors for bleeding include an age less than 80 years old.  The bleeding index is 'intermediate risk'.  Positive CHADS2 values include History of HTN, History of Diabetes, and Prior Stroke/CVA/TIA.  Negative CHADS2 values include Age > 77 years old.  The start date was 01/11/2006.  His last INR was 2.5.  Anticoagulation responsible provider: Daleen Squibb MD, Maisie Fus.  INR POC: 3.1.  Cuvette Lot#: [id number].  Exp: 08/2010.    Anticoagulation Management Assessment/Plan:      The patient's current anticoagulation dose is Warfarin sodium 10 mg tabs: Use as directed by anticoagulation clinic.  The target INR is 2.0-3.0.  The next INR is due 07/30/2009.  Anticoagulation instructions were given to patient.  Results were reviewed/authorized by Bethena Midget, RN, BSN.  He was notified by Bethena Midget, RN, BSN.         Prior Anticoagulation Instructions: INR 2.7 Continue 5mg s daily except 10mg  on Mondays. Recheck in 4 weeks.   Current Anticoagulation Instructions: INR 3.1 Today take 2.5mg s then resume 5mg s everyday except 10mg s on Mondays. Recheck in 4  weeks.

## 2010-06-22 NOTE — Medication Information (Signed)
Summary: rov/sel  Anticoagulant Therapy  Managed by: Lyna Poser, PharmD Referring MD: Arvilla Meres PCP: [name] MD Supervising MD: Myrtis Ser MD, Tinnie Gens Indication 1: Aortic Valve Replacement (ICD-V43.3) Indication 2: Aortic Valve Disorder (ICD-424.1) Lab Used: [facility] Site: [facility] INR POC 2.4 INR RANGE 2 - 3  Dietary changes: no    Health status changes: no    Bleeding/hemorrhagic complications: no    Recent/future hospitalizations: no     Recent/future dental: yes     Details: supposed to get wisdom teeth out hasn't scheduled yet.  Any missed doses?: no       Is patient compliant with meds? yes       Allergies: No Known Drug Allergies  Anticoagulation Management History:      The patient is taking warfarin and comes in today for a routine follow up visit.  Positive risk factors for bleeding include history of CVA/TIA and presence of serious comorbidities.  Negative risk factors for bleeding include an age less than 47 years old.  The bleeding index is 'intermediate risk'.  Positive CHADS2 values include History of HTN, History of Diabetes, and Prior Stroke/CVA/TIA.  Negative CHADS2 values include Age > 37 years old.  The start date was 01/11/2006.  His last INR was 2.5.  Anticoagulation responsible Islah Eve: Myrtis Ser MD, Tinnie Gens.  INR POC: 2.4.  Cuvette Lot#: [id number].  Exp: 03/2011.    Anticoagulation Management Assessment/Plan:      The patient's current anticoagulation dose is Warfarin sodium 10 mg tabs: Use as directed by anticoagulation clinic.  The target INR is 2.0-3.0.  The next INR is due 05/05/2010.  Anticoagulation instructions were given to patient.  Results were reviewed/authorized by Lyna Poser, PharmD.         Prior Anticoagulation Instructions: INR 2.5  Continue taking 1/2 tablet everyday except 1 tablet on Monday. Recheck in 4 weeks.   Current Anticoagulation Instructions: INR 2.4  Continue taking 1 tablet on monday. And a half tablet  all other days. Recheck in 4 weeks.

## 2010-06-22 NOTE — Assessment & Plan Note (Signed)
Summary: eph/ appt is 1:30/ gd   Visit Type:  Follow-up Primary Arcelia Pals:  [name] MD  CC:  no compliants.  History of Present Illness: [name] is a very pleasant 45 year old male with a history of spontaneous ascending aortic dissection with aortic valve involvement status post mechanical AVR with ascending aorta conduit in 2000.  He also has a history of morbid obesity, hypertension, diabetes, and hyperlipidemia.    Recently admitted for CP after doing push-ups. Pain clearly musculoskeletal. Enzymes normal. Returns today for f/u. Doing well. No further CP. Walking 2 miles/day without problem. Tolerating coumadin without incident. INR 3.0.   Current Medications (verified): 1)  Metformin Hcl 1000 Mg  Tabs (Metformin Hcl) .... Take 1 Tablet By Mouth Two Times A Day 2)  Low-Dose Aspirin 81 Mg  Tabs (Aspirin) .... Take 1 Tablet By Mouth Once A Day 3)  Hyzaar 100-25 Mg Tabs (Losartan Potassium-Hctz) .... Take 1 Tablet By Mouth Once A Day 4)  Clonidine Hcl 0.2 Mg  Tabs (Clonidine Hcl) .... Take 1 Tablet By Mouth Two Times A Day 5)  Pravastatin Sodium 40 Mg  Tabs (Pravastatin Sodium) .... Take 1 Tablet By Mouth Once A Day 6)  Lantus Solostar 100 Unit/ml  Soln (Insulin Glargine) .... 70 Units Subcutaneously Once Daily 7)  Klor-Con M20 20 Meq  Tbcr (Potassium Chloride Crys Cr) .... Take 2 Tablet By Mouth Once A Day 8)  Toprol Xl 200 Mg  Tb24 (Metoprolol Succinate) .... Take 1 Tablet By Mouth Once A Day 9)  Amlodipine Besylate 10 Mg Tabs (Amlodipine Besylate) .[name].. 1po Once Daily 10)  Bd Insulin Syringe Ultrafine 31g X 5/16" 1 Ml  Misc (Insulin Syringe-Needle U-100) .... Use As Directed For Blood Sugar 11)  Vitamin D3 1000 Unit  Tabs (Cholecalciferol) .[name].. 1 By Mouth Once Daily 30 12)  Warfarin Sodium 10 Mg Tabs (Warfarin Sodium) .... Use As Directed By Anticoagulation Clinic 13)  Furosemide 40 Mg Tabs (Furosemide) .[name].. 1po Once Daily  Allergies (verified): No Known Drug Allergies  Past  History:  Past Medical History: Last updated: 12/17/2009 Ascending aortic dissection   --s/p AVR (mechanical) with re-implant of coronaries 2000   --coronaries on cath 2000 Anticoagulation Hypertension. Type 2 diabetes. History of nephrolithiasis Transient ischemic attack, hx of Hyperlipidemia vit d deficiency  Review of Systems       As per HPI and past medical history; otherwise all systems negative.   Vital Signs:  Patient profile:   45 year old male Height:      74 inches Weight:      316 pounds BMI:     40.72 Pulse rate:   67 / minute BP sitting:   128 / 72  (left arm) Cuff size:   large  Vitals Entered By: Hardin Negus, RMA (January 13, 2010 2:05 PM)     Physical Exam  General:  HEENT:  Sclerae are anicteric.  EOMI.  There are no xanthelasmas. Mucous membranes are moist. NECK:  The neck is supple, no JVD.  Carotids are 2+ bilaterally with bilateral radiated bruits off his aortic valve.  No lymphadenopathy or thyromegaly. CARDIAC:  He has a regular rate and rhythm.  There is a 3/6 systolic murmur across the aortic valve.  There is a mechanical click, which is crisp. No AI.  No rub or gallop. LUNGS:  Clear. ABDOMEN:  Obese, nontender, nondistended.  No apparent hepatosplenomegaly.  No bruits, no masses. EXTREMITIES:  Warm with no cyanosis, clubbing or edema, good distal pulses.  He does have an olecranon bursitis on his left elbow. NEUROLOGIC:  Alert and oriented times three.  Cranial nerves II through XII are intact.  Moves all four extremities without difficulty.    Impression & Recommendations:  Problem # 1:  CHEST PAIN-UNSPECIFIED (ICD-786.50) Resolved. Musculoskeletal.  Problem # 2:  AORTIC VALVE DISORDERS (ICD-424.1) Doing well s/p AVR. F/u in 1 year with echo.   Other Orders: EKG w/ Interpretation (93000)  Patient Instructions: 1)  Your physician wants you to follow-up in:  1 year.  You will receive a reminder letter in the mail two months in  advance. If you don't receive a letter, please call our office to schedule the follow-up appointment.

## 2010-06-22 NOTE — Medication Information (Signed)
Summary: rov/tm  Anticoagulant Therapy  Managed by: Eda Keys, PharmD Referring MD: Arvilla Meres Supervising MD: Daleen Squibb MD, Maisie Fus Indication 1: Aortic Valve Replacement (ICD-V43.3) Indication 2: Aortic Valve Disorder (ICD-424.1) Lab Used: [facility] Site: [facility] INR POC 2.9 INR RANGE 2 - 3  Dietary changes: no    Health status changes: no    Bleeding/hemorrhagic complications: no    Recent/future hospitalizations: no    Any changes in medication regimen? no    Recent/future dental: no  Any missed doses?: no       Is patient compliant with meds? yes       Allergies: No Known Drug Allergies  Anticoagulation Management History:      The patient is taking warfarin and comes in today for a routine follow up visit.  Positive risk factors for bleeding include history of CVA/TIA and presence of serious comorbidities.  Negative risk factors for bleeding include an age less than 16 years old.  The bleeding index is 'intermediate risk'.  Positive CHADS2 values include History of HTN, History of Diabetes, and Prior Stroke/CVA/TIA.  Negative CHADS2 values include Age > 79 years old.  The start date was 01/11/2006.  His last INR was 2.5.  Anticoagulation responsible provider: Daleen Squibb MD, Maisie Fus.  INR POC: 2.9.  Cuvette Lot#: [id number].  Exp: 09/2010.    Anticoagulation Management Assessment/Plan:      The patient's current anticoagulation dose is Warfarin sodium 10 mg tabs: Use as directed by anticoagulation clinic.  The target INR is 2.0-3.0.  The next INR is due 08/27/2009.  Anticoagulation instructions were given to patient.  Results were reviewed/authorized by Eda Keys, PharmD.  He was notified by Eda Keys.         Prior Anticoagulation Instructions: INR 3.1 Today take 2.5mg s then resume 5mg s everyday except 10mg s on Mondays. Recheck in 4 weeks.   Current Anticoagulation Instructions: INR 2.9  continue taking 1 tablet (10 mg) on Monday, and 1/2 tablet (5  mg) all other days.  Return to clinic in 4 weeks.

## 2010-06-22 NOTE — Medication Information (Signed)
Summary: rov coumadin - may be here at 1:30 - due to my thought of res...  Anticoagulant Therapy  Managed by: Weston Brass, PharmD Referring MD: Arvilla Meres PCP: [name] MD Supervising MD: Johney Frame MD, Fayrene Fearing Indication 1: Aortic Valve Replacement (ICD-V43.3) Indication 2: Aortic Valve Disorder (ICD-424.1) Lab Used: [facility] Site: [facility] INR POC 3.0 INR RANGE 2 - 3  Dietary changes: no    Health status changes: no    Bleeding/hemorrhagic complications: no    Recent/future hospitalizations: no    Any changes in medication regimen? no    Recent/future dental: no  Any missed doses?: no       Is patient compliant with meds? yes       Allergies: No Known Drug Allergies  Anticoagulation Management History:      The patient is taking warfarin and comes in today for a routine follow up visit.  Positive risk factors for bleeding include history of CVA/TIA and presence of serious comorbidities.  Negative risk factors for bleeding include an age less than 16 years old.  The bleeding index is 'intermediate risk'.  Positive CHADS2 values include History of HTN, History of Diabetes, and Prior Stroke/CVA/TIA.  Negative CHADS2 values include Age > 39 years old.  The start date was 01/11/2006.  His last INR was 2.5.  Anticoagulation responsible provider: Allred MD, Fayrene Fearing.  INR POC: 3.0.  Cuvette Lot#: [id number].  Exp: 02/2011.    Anticoagulation Management Assessment/Plan:      The patient's current anticoagulation dose is Warfarin sodium 10 mg tabs: Use as directed by anticoagulation clinic.  The target INR is 2.0-3.0.  The next INR is due 02/10/2010.  Anticoagulation instructions were given to patient.  Results were reviewed/authorized by Weston Brass, PharmD.  He was notified by Weston Brass PharmD.         Prior Anticoagulation Instructions: INR 2.1  coumadin 10mg  on Mon and 1/2 tab all other days. recheck INR next Wednesday 01/13/10 when seeing Dr Gala Romney  Current  Anticoagulation Instructions: INR 3.0  Continue same dose of 1/2 tablet every day except 1 tablet on Monday.  Recheck INR in 4 weeks.

## 2010-06-22 NOTE — Medication Information (Signed)
Summary: rov/ln  Anticoagulant Therapy  Managed by: Cloyde Reams, RN, BSN Referring MD: Arvilla Meres Supervising MD: Myrtis Ser MD, Tinnie Gens Indication 1: Aortic Valve Replacement (ICD-V43.3) Indication 2: Aortic Valve Disorder (ICD-424.1) Lab Used: [facility] Site: [facility] INR POC 2.3 INR RANGE 2 - 3  Dietary changes: no    Health status changes: no    Bleeding/hemorrhagic complications: no    Recent/future hospitalizations: no    Any changes in medication regimen? no    Recent/future dental: no  Any missed doses?: no       Is patient compliant with meds? yes       Allergies: No Known Drug Allergies  Anticoagulation Management History:      The patient is taking warfarin and comes in today for a routine follow up visit.  Positive risk factors for bleeding include history of CVA/TIA and presence of serious comorbidities.  Negative risk factors for bleeding include an age less than 60 years old.  The bleeding index is 'intermediate risk'.  Positive CHADS2 values include History of HTN, History of Diabetes, and Prior Stroke/CVA/TIA.  Negative CHADS2 values include Age > 29 years old.  The start date was 01/11/2006.  His last INR was 2.5.  Anticoagulation responsible provider: Myrtis Ser MD, Tinnie Gens.  INR POC: 2.3.  Cuvette Lot#: [id number].  Exp: 02/2011.    Anticoagulation Management Assessment/Plan:      The patient's current anticoagulation dose is Warfarin sodium 10 mg tabs: Use as directed by anticoagulation clinic.  The target INR is 2.0-3.0.  The next INR is due 01/07/2010.  Anticoagulation instructions were given to patient.  Results were reviewed/authorized by Cloyde Reams, RN, BSN.  He was notified by Weston Brass PharmD.         Prior Anticoagulation Instructions: INR 1.8  Take 1 tab (10mg ) today.  Continue same regimen of 10mg  (1 tab) on Monday and 5mg  (1/2 tab) all other days.     Current Anticoagulation Instructions: INR 2.3 Continue 5mg s daily except 10mg s on  Mondays. Recheck in 4 weeks

## 2010-06-22 NOTE — Medication Information (Signed)
Summary: rov/sp  Anticoagulant Therapy  Managed by: Eda Keys, PharmD Referring MD: Arvilla Meres Supervising MD: Johney Frame MD, Fayrene Fearing Indication 1: Aortic Valve Replacement (ICD-V43.3) Indication 2: Aortic Valve Disorder (ICD-424.1) Lab Used: [facility] Site: [facility] INR POC 2.4 INR RANGE 2 - 3  Dietary changes: no    Health status changes: no    Bleeding/hemorrhagic complications: no    Recent/future hospitalizations: no    Any changes in medication regimen? no    Recent/future dental: no  Any missed doses?: no       Is patient compliant with meds? yes      Comments: Pt was instructed to take 1/2 tablet daily at last visit, but has been continuing on 1/2 tablet daily EXCEPT 1 tblet on monday.  He is now eating lots of salads and vegetables as part of a diet and is exercising 6 days per week, he has lost 11 lbs so far and his goal is to lose 100 lbs.  I will continue current dosing schedule as he has been taking it, INR is therapuetic.  Allergies: No Known Drug Allergies  Anticoagulation Management History:      The patient is taking warfarin and comes in today for a routine follow up visit.  Positive risk factors for bleeding include history of CVA/TIA and presence of serious comorbidities.  Negative risk factors for bleeding include an age less than 64 years old.  The bleeding index is 'intermediate risk'.  Positive CHADS2 values include History of HTN, History of Diabetes, and Prior Stroke/CVA/TIA.  Negative CHADS2 values include Age > 31 years old.  The start date was 01/11/2006.  His last INR was 2.5.  Anticoagulation responsible provider: Deleah Tison MD, Fayrene Fearing.  INR POC: 2.4.  Cuvette Lot#: [id number].  Exp: 12/2010.    Anticoagulation Management Assessment/Plan:      The patient's current anticoagulation dose is Warfarin sodium 10 mg tabs: Use as directed by anticoagulation clinic.  The target INR is 2.0-3.0.  The next INR is due 11/11/2009.  Anticoagulation  instructions were given to patient.  Results were reviewed/authorized by Eda Keys, PharmD.  He was notified by Eda Keys.         Prior Anticoagulation Instructions: INR 3.6  Skip today's dose of Coumadin then decrease dose to 5mg  daily   Current Anticoagulation Instructions: INR 2.4  Continue taking 1 tablet on Monday and 1/2 tablet all other days. Return to clinic in 4 weeks.

## 2010-06-24 NOTE — Medication Information (Signed)
Summary: CCR  Anticoagulant Therapy  Managed by: Weston Brass, PharmD Referring MD: Arvilla Meres PCP: [name] MD Supervising MD: Jens Som MD, Arlys John Indication 1: Aortic Valve Replacement (ICD-V43.3) Indication 2: Aortic Valve Disorder (ICD-424.1) Lab Used: [facility]  Site: [facility] INR POC 1.8 INR RANGE 2 - 3  Dietary changes: yes       Details: Increased salad intake  Health status changes: no    Bleeding/hemorrhagic complications: no    Recent/future hospitalizations: no    Any changes in medication regimen? no    Recent/future dental: no  Any missed doses?: no       Is patient compliant with meds? yes       Allergies: No Known Drug Allergies  Anticoagulation Management History:      The patient is taking warfarin and comes in today for a routine follow up visit.  Positive risk factors for bleeding include history of CVA/TIA and presence of serious comorbidities.  Negative risk factors for bleeding include an age less than 39 years old.  The bleeding index is 'intermediate risk'.  Positive CHADS2 values include History of HTN, History of Diabetes, and Prior Stroke/CVA/TIA.  Negative CHADS2 values include Age > 21 years old.  The start date was 01/11/2006.  His last INR was 2.5 and today's INR is 1.8.  Anticoagulation responsible provider: Jens Som MD, Arlys John.  INR POC: 1.8.  Cuvette Lot#: [id number].  Exp: 03/2011.    Anticoagulation Management Assessment/Plan:      The patient's current anticoagulation dose is Warfarin sodium 10 mg tabs: Use as directed by anticoagulation clinic.  The target INR is 2.0-3.0.  The next INR is due 06/14/2010.  Anticoagulation instructions were given to patient.  Results were reviewed/authorized by Weston Brass, PharmD.  He was notified by Linward Headland, PharmD candidate.         Prior Anticoagulation Instructions: INR 1.7  Coumadin 10mg  tabs - Take 1 tab today WED 12/21  then 1/2 tab each day EXCEPT 1 tab MON and FRI  Current  Anticoagulation Instructions: INR 1.8 (INR goal: 2-3)  Take 1 and 1/2 tablets today.  Take 1/2 tablet on Sundays, Tuesdays, Wednesdays, Fridays, and Saturdays and 1 tablet on Mondays and Thursdays.  Recheck in 2 weeks.

## 2010-06-24 NOTE — Assessment & Plan Note (Signed)
Summary: EPH   Visit Type:  Follow-up Primary Provider:  [name] MD  CC:  no cardiac complaints.  History of Present Illness: Jorja Loa is a very pleasant 45 year old male with a history of spontaneous ascending aortic dissection with aortic valve involvement status post mechanical AVR with ascending aorta conduit in 2000.  He also has a history of morbid obesity, hypertension, diabetes, and hyperlipidemia.    Recently admitted for atypical CP. Enzymes normal.   This week had exercise Myoview. Walked for 9:00 on [place]. No CP or ECG changes. Images with EF 39% Prominent inferior scar with very mild reversibility.  Returns today for f/u. Doing well. No further CP. Walking 2 miles/day and going to the gym without problem. Tolerating coumadin without incident. INR 3.0.   Current Medications (verified): 1)  Metformin Hcl 1000 Mg  Tabs (Metformin Hcl) .... Take 1 Tablet By Mouth Two Times A Day 2)  Low-Dose Aspirin 81 Mg  Tabs (Aspirin) .... Take 1 Tablet By Mouth Once A Day 3)  Hyzaar 100-25 Mg Tabs (Losartan Potassium-Hctz) .... Take 1 Tablet By Mouth Once A Day 4)  Clonidine Hcl 0.2 Mg  Tabs (Clonidine Hcl) .... Take 1 Tablet By Mouth Two Times A Day 5)  Pravastatin Sodium 40 Mg  Tabs (Pravastatin Sodium) .... Take 1 Tablet By Mouth Once A Day 6)  Lantus Solostar 100 Unit/ml  Soln (Insulin Glargine) .... 70 Units Subcutaneously Once Daily 7)  Klor-Con M20 20 Meq  Tbcr (Potassium Chloride Crys Cr) .... Take 2 Tablet By Mouth Once A Day 8)  Toprol Xl 200 Mg  Tb24 (Metoprolol Succinate) .... Take 1 Tablet By Mouth Once A Day 9)  Amlodipine Besylate 10 Mg Tabs (Amlodipine Besylate) .[name].. 1po Once Daily 10)  Bd Insulin Syringe Ultrafine 31g X 5/16" 1 Ml  Misc (Insulin Syringe-Needle U-100) .... Use As Directed For Blood Sugar 11)  Vitamin D3 1000 Unit  Tabs (Cholecalciferol) .[name].. 1 By Mouth Once Daily 30 12)  Warfarin Sodium 10 Mg Tabs (Warfarin Sodium) .... Use As Directed By Anticoagulation  Clinic 13)  Furosemide 40 Mg Tabs (Furosemide) .[name].. 1po Once Daily  Allergies (verified): No Known Drug Allergies  Past History:  Past Medical History: Ascending aortic dissection   --s/p AVR (mechanical) with re-implant of coronaries 2000   --normal coronaries on cath 2000 Anticoagulation Hypertension. Type 2 diabetes. History of nephrolithiasis Transient ischemic attack, hx of Hyperlipidemia vit d deficiency  Review of Systems       As per HPI and past medical history; otherwise all systems negative.   Vital Signs:  Patient profile:   45 year old male Height:      74 inches Weight:      309 pounds Pulse rate:   68 / minute Pulse rhythm:   regular BP sitting:   111 / 85  (left arm)  Vitals Entered By: Jacquelin Hawking, CMA (June 14, 2010 11:36 AM)  Physical Exam  General:  Normal. well appearing HEENT:  Sclerae are anicteric.  EOMI.  Mucous membranes are moist. NECK:  The neck is supple, no JVD.  Carotids are 2+ bilaterally with bilateral radiated bruits off his aortic valve.  No lymphadenopathy or thyromegaly. CARDIAC:  He has a regular rate and rhythm.  There is a 3/6 systolic murmur across the aortic valve.  There is a mechanical click, which is crisp. No AI.  No rub or gallop. LUNGS:  Clear. ABDOMEN:  Obese, nontender, nondistended.  No apparent hepatosplenomegaly.  No bruits,  no masses. EXTREMITIES:  Warm with no cyanosis, clubbing or edema, good distal pulses.   NEUROLOGIC:  Alert and oriented times three.  Cranial nerves II through XII are intact.  Moves all four extremities without difficulty.    Impression & Recommendations:  Problem # 1:  CHEST TIGHTNESS-PRESSURE-OTHER ([id number]) His CP seems quite atypical, however does have a prominent inferior defect on Myoview which is very suspicious for previous infarct and mild ischemia. Given his size, however, this could also be gut artifact. Ideally he needs a cath but he hasa mechanical AVR and I am  reluctant to stop coumadin unless we absolutely have to. Will start with an echo to look at [facility] and inferior wall.  We will try to see if we can get his insurance to agree to a cardiac CT to look at his coronaries, if not he will likely need a cath with a Lovenox bridge.  Other Orders: Echocardiogram (Echo) Cardiac CTA (Cardiac CTA)  Patient Instructions: 1)  Your physician recommends that you schedule a follow-up appointment in: 1 month with Dr Gala Romney 2)  Your physician recommends that you continue on your current medications as directed. Please refer to the Current Medication list given to you today. 3)  Your physician has requested that you have an echocardiogram, 1 st then Cardiac CT.  Echocardiography is a painless test that uses sound waves to create images of your heart. It provides your doctor with information about the size and shape of your heart and how well your heart's chambers and valves are working.  This procedure takes approximately one hour. There are no restrictions for this procedure. 4)  Your physician has requested that you have a cardiac CT.  Cardiac computed tomography (CT) is a painless test that uses an x-ray machine to take clear, detailed pictures of your heart.  For further information please visit [email or web address]  Please follow instruction sheet as given.

## 2010-06-24 NOTE — Medication Information (Signed)
Summary: rov  Anticoagulant Therapy  Managed by: Bethena Midget, RN, BSN Referring MD: Arvilla Meres PCP: [name] MD Supervising MD: Daleen Squibb MD, Maisie Fus Indication 1: Aortic Valve Replacement (ICD-V43.3) Indication 2: Aortic Valve Disorder (ICD-424.1) Lab Used: [facility] Site: [facility] INR POC 2.1 INR RANGE 2 - 3  Dietary changes: no    Health status changes: no    Bleeding/hemorrhagic complications: no    Recent/future hospitalizations: no    Any changes in medication regimen? no    Recent/future dental: no  Any missed doses?: no       Is patient compliant with meds? yes       Allergies: No Known Drug Allergies  Anticoagulation Management History:      The patient is taking warfarin and comes in today for a routine follow up visit.  Positive risk factors for bleeding include history of CVA/TIA and presence of serious comorbidities.  Negative risk factors for bleeding include an age less than 99 years old.  The bleeding index is 'intermediate risk'.  Positive CHADS2 values include History of HTN, History of Diabetes, and Prior Stroke/CVA/TIA.  Negative CHADS2 values include Age > 72 years old.  The start date was 01/11/2006.  His last INR was 1.8.  Anticoagulation responsible provider: Daleen Squibb MD, Maisie Fus.  INR POC: 2.1.  Cuvette Lot#: [id number].  Exp: 05/2011.    Anticoagulation Management Assessment/Plan:      The patient's current anticoagulation dose is Warfarin sodium 10 mg tabs: Use as directed by anticoagulation clinic.  The target INR is 2.0-3.0.  The next INR is due 06/30/2010.  Anticoagulation instructions were given to patient.  Results were reviewed/authorized by Bethena Midget, RN, BSN.  He was notified by Bethena Midget, RN, BSN.         Prior Anticoagulation Instructions: INR 1.8 (INR goal: 2-3)  Take 1 and 1/2 tablets today.  Take 1/2 tablet on Sundays, Tuesdays, Wednesdays, Fridays, and Saturdays and 1 tablet on Mondays and Thursdays.  Recheck in 2  weeks.  Current Anticoagulation Instructions: INR 2.1 Continue 1/2 pill everyday except 1 pill on Mondays and Thursdays. Recheck in 2 weeks.

## 2010-06-24 NOTE — Assessment & Plan Note (Signed)
Summary: Cardiology Nuclear Testing  Nuclear Med Background Indications for Stress Test: Evaluation for Ischemia, Post Hospital  Indications Comments: 05/27/10 Chest pain, (-) enzymes  History: CT/MRI, Echo, Heart Catheterization  History Comments: '00 Cath:normal coronaries; '02 AVR for dissection repair; 5/11 Echo: moderate AS, mild AI; 05/26/10 CT: (-) PE  Symptoms: Chest Pain, Light-Headedness, Nausea  Symptoms Comments: CP>(L) arm. Last episode of ZO:XWRU since d/c.   Nuclear Pre-Procedure Cardiac Risk Factors: Family History - CAD, Hypertension, IDDM Type 2, Lipids, Obesity, TIA Caffeine/Decaff Intake: None NPO After: 6:30 AM Lungs: Clear IV 0.9% NS with Angio Cath: 18g     IV Site: R Antecubital IV Started by: Stanton Kidney, EMT-P Chest Size (in) 52     Height (in): 74 Weight (lb): 306 BMI: 39.43 Tech Comments: Toprol held > 24hours. CBG=103 @ 6:30 am this day, per patient.   Nuclear Med Study 1 or 2 day study:  2 day     Stress Test Type:  Stress Reading MD:  Dietrich Pates, MD     Referring MD:  Arvilla Meres, MD Resting Radionuclide:  Technetium 35m Tetrofosmin     Resting Radionuclide Dose:  33 mCi  Stress Radionuclide:  Technetium 62m Tetrofosmin     Stress Radionuclide Dose:  33 mCi   Stress Protocol Exercise Time (min):  9:00 min     Max HR:  166 bpm     Predicted Max HR:  175 bpm  Max Systolic BP: 199 mm Hg     Percent Max HR:  94.86 %     METS: 10.4 Rate Pressure Product:  04540    Stress Test Technologist:  [employer]a [employer], CMA-N     Nuclear Technologist:  Doyne Keel, CNMT  Rest Procedure  Myocardial perfusion imaging was performed at rest 45 minutes following the intravenous administration of Technetium 6m Tetrofosmin.  Stress Procedure  The patient exercised for nine minutes using the Bruce protocol.  The patient stopped due to fatigue and denied any chest pain.  There were no diagnostic ST-T wave changes, only frequent PVC's with couplets and occasional  PAC's.  Technetium 69m Tetrofosmin was injected at peak exercise and myocardial perfusion imaging was performed after a brief delay.  QPS Raw Data Images:  Images were motion corrected.  Soft tissue (diaphragm, subcutaneous fat) surround heart. Stress Images:  Defect in the inferior wall (base, mid, minimally distal) and apex.  Otherwise normal perfusion. Rest Images:  Minimal change from the stress images. Subtraction (SDS):  No significant ischemia. Transient Ischemic Dilatation:  1.06  (Normal <1.22)  Lung/Heart Ratio:  .36  (Normal <0.45)  Quantitative Gated Spect Images QGS EDV:  174 ml QGS ESV:  105 ml QGS EF:  39 % QGS cine images:  Basal inferior/inferoseptal hypokinesis.  NOte that visually LVEF appears greater.   Overall Impression  Exercise Capacity: Good exercise capacity. BP Response: Normal blood pressure response. Clinical Symptoms: No chest pain ECG Impression: No significant ST segment changes to suggest ischemia.   Less than 1 mm ST depresison late in recovery. Note extensive atrial ectopy with exercise (MAT) Overall Impression Comments: Inferior defect consistent with scar and possible soft tissue attenuation.  APical thinning.  NO evidence for significant ischemia. WOuld recommend echo to define LVEF and wall motion further as it appears better than the 39% calculated LVEF.  Appended Document: Cardiology Nuclear Testing d/w pt at OV on 1/23

## 2010-06-24 NOTE — Progress Notes (Signed)
Summary: nuc pre-procedure  Phone Note Outgoing Call   Call placed by: Domenic Polite, CNMT,  June 08, 2010 4:30 PM Call placed to: Patient Reason for Call: Confirm/change Appt Summary of Call: Left message with information on Myoview Information Sheet (see scanned document for details).      Nuclear Med Background Indications for Stress Test: Evaluation for Ischemia, Post Hospital  Indications Comments: [facility] 05/27/10 (-) enzymes  History: CT/MRI, Echo, Heart Catheterization  History Comments: '00 cath-nml. coronaries; 5/11 Echo mod.AS/mild AI; 05/26/10 CT (-) PE  Symptoms: Chest Pain  Symptoms Comments: CP>Lt. arm radiation   Nuclear Pre-Procedure Cardiac Risk Factors: Family History - CAD, Hypertension, IDDM Type 2, Lipids, TIA Height (in): 74  Nuclear Med Study Referring MD:  Arvilla Meres

## 2010-06-24 NOTE — Medication Information (Signed)
Summary: rov/mj  Anticoagulant Therapy  Managed by: Leota Sauers, PharmD, BCPS, CPP Referring MD: Arvilla Meres PCP: [name] MD Supervising MD: Myrtis Ser MD, Tinnie Gens Indication 1: Aortic Valve Replacement (ICD-V43.3) Indication 2: Aortic Valve Disorder (ICD-424.1) Lab Used: [facility] Site: [facility] INR POC 1.7 INR RANGE 2 - 3  Dietary changes: no    Health status changes: no    Bleeding/hemorrhagic complications: no    Recent/future hospitalizations: no    Any changes in medication regimen? no    Recent/future dental: no  Any missed doses?: no       Is patient compliant with meds? yes      Comments: lots of salad - wants to continue trying to loose wt has lost 40lbs!!!!  Current Medications (verified): 1)  Metformin Hcl 1000 Mg  Tabs (Metformin Hcl) .... Take 1 Tablet By Mouth Two Times A Day 2)  Low-Dose Aspirin 81 Mg  Tabs (Aspirin) .... Take 1 Tablet By Mouth Once A Day 3)  Hyzaar 100-25 Mg Tabs (Losartan Potassium-Hctz) .... Take 1 Tablet By Mouth Once A Day 4)  Clonidine Hcl 0.2 Mg  Tabs (Clonidine Hcl) .... Take 1 Tablet By Mouth Two Times A Day 5)  Pravastatin Sodium 40 Mg  Tabs (Pravastatin Sodium) .... Take 1 Tablet By Mouth Once A Day 6)  Lantus Solostar 100 Unit/ml  Soln (Insulin Glargine) .... 70 Units Subcutaneously Once Daily 7)  Klor-Con M20 20 Meq  Tbcr (Potassium Chloride Crys Cr) .... Take 2 Tablet By Mouth Once A Day 8)  Toprol Xl 200 Mg  Tb24 (Metoprolol Succinate) .... Take 1 Tablet By Mouth Once A Day 9)  Amlodipine Besylate 10 Mg Tabs (Amlodipine Besylate) .[name].. 1po Once Daily 10)  Bd Insulin Syringe Ultrafine 31g X 5/16" 1 Ml  Misc (Insulin Syringe-Needle U-100) .... Use As Directed For Blood Sugar 11)  Vitamin D3 1000 Unit  Tabs (Cholecalciferol) .[name].. 1 By Mouth Once Daily 30 12)  Warfarin Sodium 10 Mg Tabs (Warfarin Sodium) .... Use As Directed By Anticoagulation Clinic 13)  Furosemide 40 Mg Tabs (Furosemide) .[name].. 1po Once  Daily  Allergies (verified): No Known Drug Allergies  Anticoagulation Management History:      The patient is taking warfarin and comes in today for a routine follow up visit.  Positive risk factors for bleeding include history of CVA/TIA and presence of serious comorbidities.  Negative risk factors for bleeding include an age less than 44 years old.  The bleeding index is 'intermediate risk'.  Positive CHADS2 values include History of HTN, History of Diabetes, and Prior Stroke/CVA/TIA.  Negative CHADS2 values include Age > 49 years old.  The start date was 01/11/2006.  His last INR was 2.5.  Anticoagulation responsible provider: Myrtis Ser MD, Tinnie Gens.  INR POC: 1.7.  Cuvette Lot#: [id number].  Exp: 03/2011.    Anticoagulation Management Assessment/Plan:      The patient's current anticoagulation dose is Warfarin sodium 10 mg tabs: Use as directed by anticoagulation clinic.  The target INR is 2.0-3.0.  The next INR is due 05/27/2010.  Anticoagulation instructions were given to patient.  Results were reviewed/authorized by Leota Sauers, PharmD, BCPS, CPP.         Prior Anticoagulation Instructions: INR 2.4  Continue taking 1 tablet on monday. And a half tablet all other days. Recheck in 4 weeks.   Current Anticoagulation Instructions: INR 1.7  Coumadin 10mg  tabs - Take 1 tab today WED 12/21  then 1/2 tab each day EXCEPT 1 tab  MON and FRI

## 2010-06-24 NOTE — Letter (Signed)
Summary: Generic Letter  Architectural technologist, Main Office  [address]   [place], [place] [address]   Phone: [phone]  Fax: [phone]        June 15, 2010 MRN: [id number]    MOKSH LOOMER [address] [place], [place]  [address]    Dear Mr. ELLINGSON,     Dr. Gala Romney has scheduled you  for Cardiac CT on : 06/30/10 at 2pm.  DO NOT EAT OR DRINK ANYTHING AFTER 10AM, THE MORNING OF YOUR PROCEDURE.  PLEASE ARRIVE 1 HOUR PRIOR TO APPOINTMENT TIME,AT THE [facility] OUT-PATIENT ADMISSION OFFICE LOCATED ON THE FIRST FLOOR NEAR THE GIFT SHOP.    Sincerely,   Merita Norton Lloyd-Fate

## 2010-06-30 ENCOUNTER — Ambulatory Visit (HOSPITAL_BASED_OUTPATIENT_CLINIC_OR_DEPARTMENT_OTHER): Payer: Medicare Other

## 2010-06-30 ENCOUNTER — Encounter (INDEPENDENT_AMBULATORY_CARE_PROVIDER_SITE_OTHER): Payer: Medicare Other

## 2010-06-30 ENCOUNTER — Ambulatory Visit (HOSPITAL_COMMUNITY)
Admission: RE | Admit: 2010-06-30 | Discharge: 2010-06-30 | Disposition: A | Discharge status: Home / Self Care | Payer: Medicare Other | Source: Ambulatory Visit | Attending: Internal Medicine | Admitting: Internal Medicine

## 2010-06-30 ENCOUNTER — Encounter (HOSPITAL_COMMUNITY): Payer: Self-pay

## 2010-06-30 ENCOUNTER — Encounter: Payer: Self-pay | Admitting: Cardiology

## 2010-06-30 ENCOUNTER — Other Ambulatory Visit: Payer: Self-pay | Admitting: Internal Medicine

## 2010-06-30 DIAGNOSIS — Z09 Encounter for follow-up examination after completed treatment for conditions other than malignant neoplasm: Secondary | ICD-10-CM

## 2010-06-30 DIAGNOSIS — R9389 Abnormal findings on diagnostic imaging of other specified body structures: Secondary | ICD-10-CM | POA: Insufficient documentation

## 2010-06-30 DIAGNOSIS — I7789 Other specified disorders of arteries and arterioles: Secondary | ICD-10-CM | POA: Insufficient documentation

## 2010-06-30 DIAGNOSIS — I359 Nonrheumatic aortic valve disorder, unspecified: Secondary | ICD-10-CM

## 2010-06-30 DIAGNOSIS — I4891 Unspecified atrial fibrillation: Secondary | ICD-10-CM

## 2010-06-30 DIAGNOSIS — Z7901 Long term (current) use of anticoagulants: Secondary | ICD-10-CM

## 2010-06-30 DIAGNOSIS — I2510 Atherosclerotic heart disease of native coronary artery without angina pectoris: Secondary | ICD-10-CM | POA: Insufficient documentation

## 2010-06-30 HISTORY — DX: Essential (primary) hypertension: I10

## 2010-06-30 IMAGING — CT CT HEART MORPH/PULM VEIN W/ CM & W/O CA SCORE
2 of 6 series · 11 of 20 positions shown, 12 images · non-contrast
Comparison: CT of 05/26/2010

***ADDENDUM*** CREATED: 07/01/2010 [DATE]

CARDIAC CTA WITH CALCIUM SCORE 06/30/2010 [DATE]
Ordering Physician: ANTOINE ANG
[facility] Physician: Nhi Beaty
PROTOCOL: The patient scanned on a [employer] [REDACTED]ice scanner.  We
used 120 kV with 5% phase tolerance (prospective gating).   5 mg of
IV Lopressor was administered.  Average heart rate during the scan
was 60 beats per minute.  After an initial AP and lateral topogram,
3 mm axial slices were performed through the heart for calcium
scoring.  The patient then received. The patient then had 100 ml of
contrast given for coronary CTA with bolus tracking using a region
of interest in the descending thoracic aorta. The 3-D data set was
then sent to the [employer] workstation.  Reconstructions were done
using MIP,MPR and VRT modes.

[Series 2: calcium score · axial · 0.49mm/px · z∈[-229,-112]mm · 3 of 48 slices shown, 4 images]
[im 1/48  vessel]
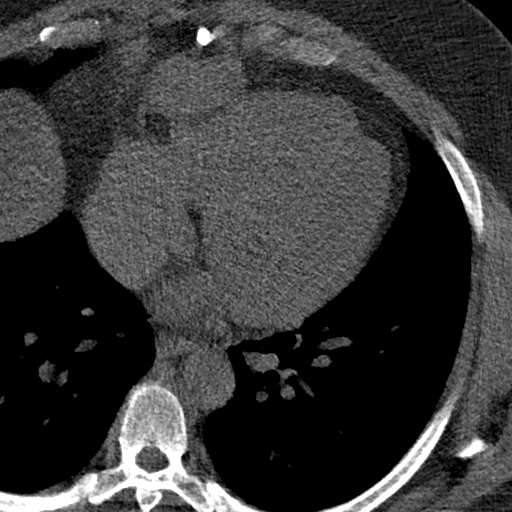
[im 1/48  lung]
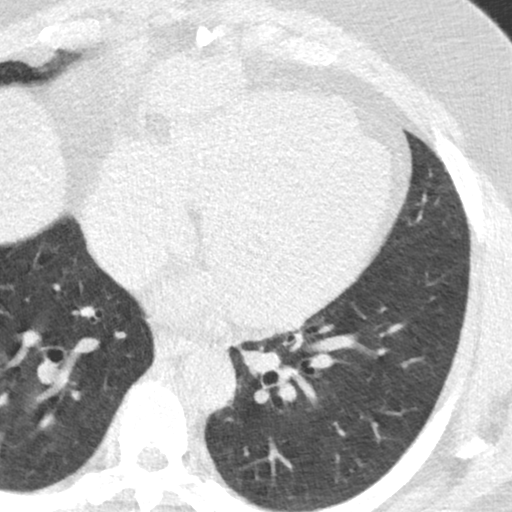
[im 24/48  vessel]
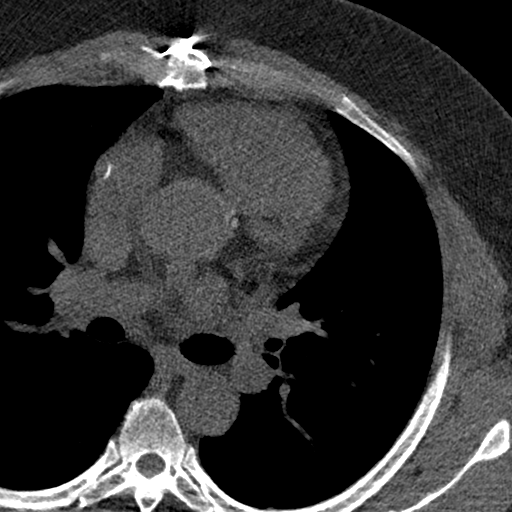
[im 48/48  vessel]
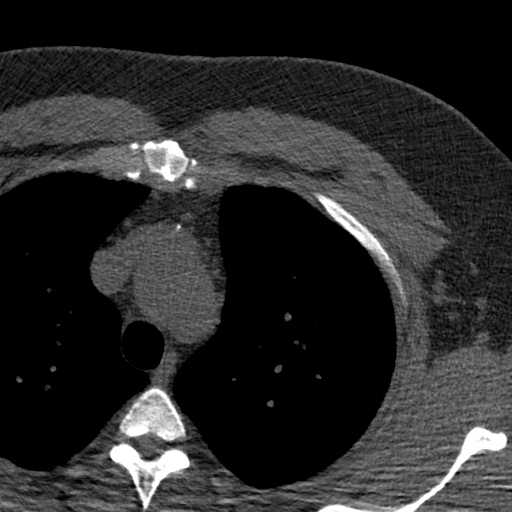

[Series 13: w/ edge cor., 78.0% · axial · 0.49mm/px · z∈[-245,-149]mm · 8 of 276 slices shown]
[im 31/276  lung]
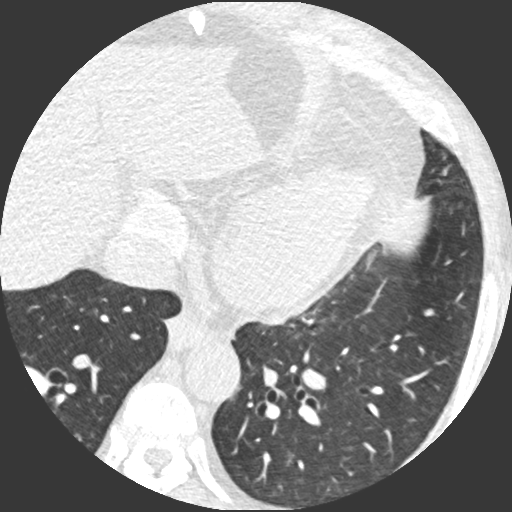
[im 62/276  lung]
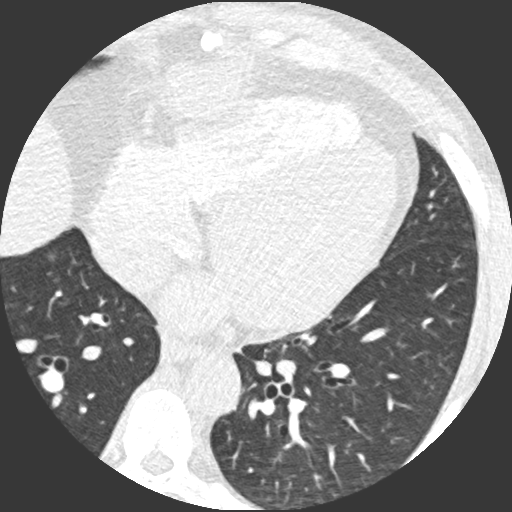
[im 92/276  lung]
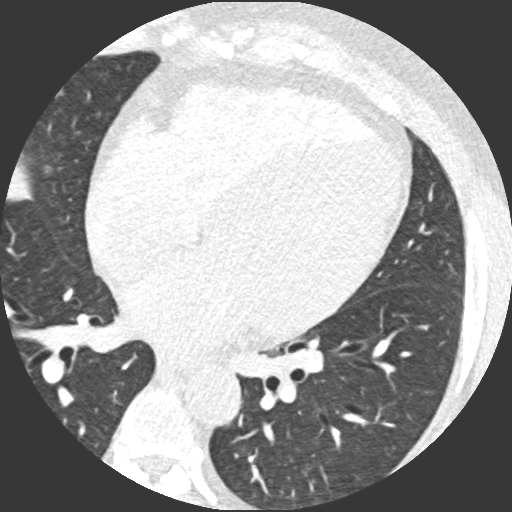
[im 123/276  lung]
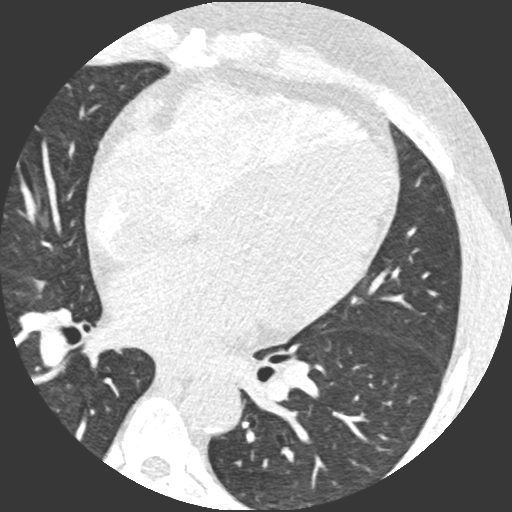
[im 153/276  lung]
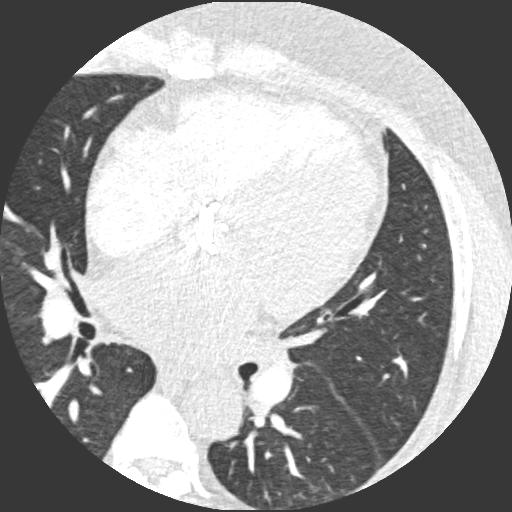
[im 184/276  lung]
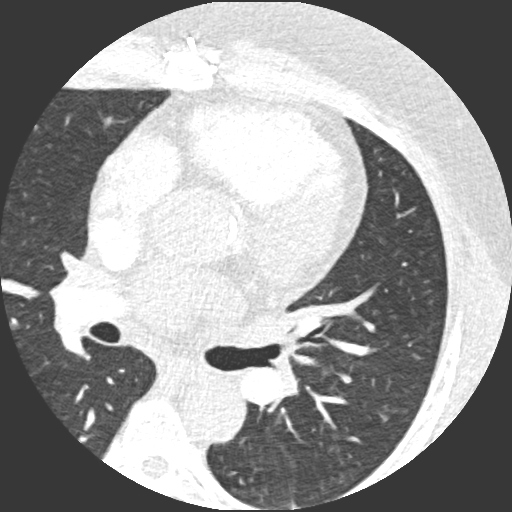
[im 214/276  lung]
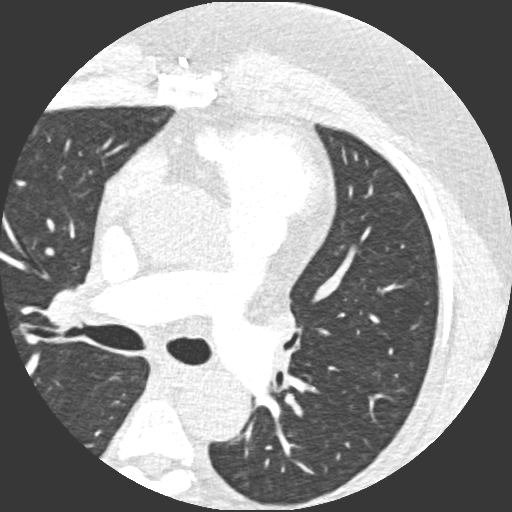
[im 245/276  lung]
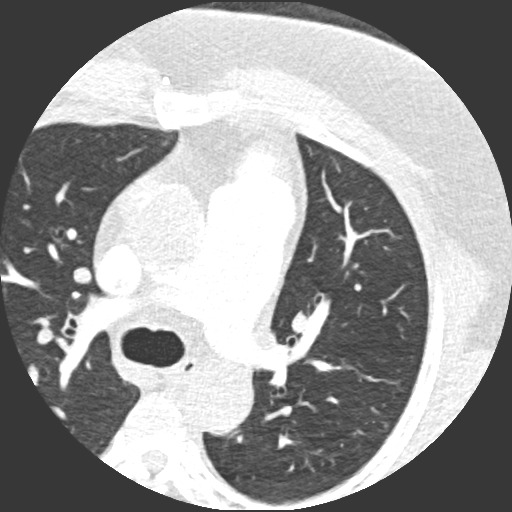

[11 of 20 positions shown; findings below may reference images not displayed]

Indications: Abnormal myoview

DETAILED FINDINGS:

Quality of Study: Fair, some limitation due to body habitus

Left Main: No plaque or stenosis

Left Anterior Descending: No plaque or stenosis

Left Circumflex: There was a moderate ramus in addition to the
circumflex itself.  No plaque or stenosis.

Right Coronary Artery: There was a rather large AV nodal artery.
There was mixed plaque without significant stenosis in the proximal
RCA.  The RCA was a dominant vessel.

The pulmonary veins connected normally to the left atrium.

The patient was status post mechanical aortic valve replacement and
graft replacement of the aortic root.

Coronary Calcium Score: Calcium score was not interpretable due to
artifact from the mechanical valve.
IMPRESSION: No significant coronary disease with only mild plaque noted in the
proximal right coronary artery.

***END ADDENDUM*** SIGNED BY: Nhi Beaty

OVER-READ INTERPRETATION - CT CHEST

The following report is an over-read performed by radiologist Dr.
[DATE].  This over-read does not include interpretation of
cardiac or coronary anatomy or pathology.  The CTA interpretation
by the cardiologist is attached.
FINDINGS: Lung windows demonstrate no lung nodules or airspace
opacities identified within the imaged chest.

Soft tissue windows demonstrate pulmonary artery enlargement at
cm (upper limits of normal 3.0 cm).  No large or central embolism
identified.  Prior median sternotomy.  Surgical changes of aortic
valve repair and ascending aortic graft placement.  The previously
described transverse aortic dissection is not imaged.  The
descending thoracic aorta is normal in caliber.  No pericardial or
pleural effusion. No mediastinal or hilar adenopathy.

Limited abdominal imaging demonstrates no significant findings.
IMPRESSION: 1.  Surgical changes of aortic valve repair and ascending aortic
graft placement.  Lack of imaging of the previously described
transverse aortic dissection (see report of 12/31/2009).
[DATE].  Pulmonary artery enlargement suggests pulmonary arterial
hypertension.

## 2010-06-30 MED ORDER — IOHEXOL 350 MG/ML SOLN
150.0000 mL | Freq: Once | INTRAVENOUS | Status: AC | PRN
  Administered 2010-06-30: 160 mL via INTRAVENOUS

## 2010-07-01 DIAGNOSIS — R079 Chest pain, unspecified: Secondary | ICD-10-CM

## 2010-07-05 ENCOUNTER — Telehealth: Payer: Self-pay | Admitting: Internal Medicine

## 2010-07-08 NOTE — Medication Information (Signed)
Summary: Coumadin Clinic  Anticoagulant Therapy  Managed by: Windell Hummingbird, RN Referring MD: Arvilla Meres, MD PCP: [name] MD Supervising MD: Shirlee Latch MD, Dalton Indication 1: Aortic Valve Replacement (ICD-V43.3) Indication 2: Aortic Valve Disorder (ICD-424.1) Lab Used: [facility] Site: [facility] INR POC 2.0 INR RANGE 2 - 3  Dietary changes: no    Health status changes: no    Bleeding/hemorrhagic complications: no    Recent/future hospitalizations: no    Any changes in medication regimen? no    Recent/future dental: no  Any missed doses?: no       Is patient compliant with meds? yes       Allergies: No Known Drug Allergies  Anticoagulation Management History:      The patient is taking warfarin and comes in today for a routine follow up visit.  Positive risk factors for bleeding include history of CVA/TIA and presence of serious comorbidities.  Negative risk factors for bleeding include an age less than 25 years old.  The bleeding index is 'intermediate risk'.  Positive CHADS2 values include History of HTN, History of Diabetes, and Prior Stroke/CVA/TIA.  Negative CHADS2 values include Age > 63 years old.  The start date was 01/11/2006.  His last INR was 1.8.  Anticoagulation responsible provider: Shirlee Latch MD, Dalton.  INR POC: 2.0.  Cuvette Lot#: [id number].  Exp: 05/2011.    Anticoagulation Management Assessment/Plan:      The patient's current anticoagulation dose is Warfarin sodium 10 mg tabs: Use as directed by anticoagulation clinic.  The target INR is 2.0-3.0.  The next INR is due 07/28/2010.  Anticoagulation instructions were given to patient.  Results were reviewed/authorized by Windell Hummingbird, RN.  He was notified by Windell Hummingbird, RN.         Prior Anticoagulation Instructions: INR 2.1 Continue 1/2 pill everyday except 1 pill on Mondays and Thursdays. Recheck in 2 weeks.   Current Anticoagulation Instructions: INR 2.0 Continue taking 1/2 tablet every day,  except take 1 tablet on Mondays and Thursdays. Recheck in 4 weeks.

## 2010-07-14 NOTE — Progress Notes (Signed)
Summary: test results  Phone Note Call from Patient Call back at Home Phone [phone]   Caller: Patient Summary of Call: Test results ( Echo) Initial call taken by: Judie Grieve,  July 05, 2010 10:14 AM  Follow-up for Phone Call        pt aware of test results Meredith Staggers, RN  July 05, 2010 11:48 AM

## 2010-07-20 ENCOUNTER — Ambulatory Visit: Payer: Self-pay | Admitting: Internal Medicine

## 2010-07-27 ENCOUNTER — Encounter: Payer: Self-pay | Admitting: Internal Medicine

## 2010-07-27 DIAGNOSIS — G459 Transient cerebral ischemic attack, unspecified: Secondary | ICD-10-CM

## 2010-07-27 DIAGNOSIS — I4891 Unspecified atrial fibrillation: Secondary | ICD-10-CM

## 2010-07-27 DIAGNOSIS — I359 Nonrheumatic aortic valve disorder, unspecified: Secondary | ICD-10-CM

## 2010-07-27 DIAGNOSIS — Z8679 Personal history of other diseases of the circulatory system: Secondary | ICD-10-CM

## 2010-07-28 ENCOUNTER — Encounter (INDEPENDENT_AMBULATORY_CARE_PROVIDER_SITE_OTHER): Payer: Medicare Other

## 2010-07-28 ENCOUNTER — Encounter: Payer: Self-pay | Admitting: Cardiology

## 2010-07-28 DIAGNOSIS — Z7901 Long term (current) use of anticoagulants: Secondary | ICD-10-CM

## 2010-07-28 DIAGNOSIS — I359 Nonrheumatic aortic valve disorder, unspecified: Secondary | ICD-10-CM

## 2010-07-28 LAB — CONVERTED CEMR LAB: POC INR: 2.7

## 2010-07-30 ENCOUNTER — Encounter (INDEPENDENT_AMBULATORY_CARE_PROVIDER_SITE_OTHER): Payer: Self-pay | Admitting: *Deleted

## 2010-08-03 NOTE — Letter (Signed)
Summary: Appointment - Missed  [facility], Main Office  [address]   [place], [place] [address]   Phone: [phone]  Fax: [phone]     July 30, 2010 MRN: [id number]   DASAN HARDMAN [address] [place], [place]  [address]   Dear Mr. RUST,  Our records indicate you missed your appointment on  Feb 28,2012                        with Dr. Gala Romney . It is very important that we reach you to reschedule this appointment. We look forward to participating in your health care needs. Please contact us at the number listed above at your earliest convenience to reschedule this appointment.     Sincerely, Catering manager

## 2010-08-03 NOTE — Medication Information (Signed)
Summary: rov/pc  Anticoagulant Therapy  Managed by: Weston Brass, PharmD Referring MD: Arvilla Meres, MD PCP: [name] MD Supervising MD: Myrtis Ser MD, Tinnie Gens Indication 1: Aortic Valve Replacement (ICD-V43.3) Indication 2: Aortic Valve Disorder (ICD-424.1) Lab Used: [facility] Site: [facility] INR POC 2.7 INR RANGE 2 - 3  Dietary changes: no    Health status changes: no    Bleeding/hemorrhagic complications: no    Recent/future hospitalizations: no    Any changes in medication regimen? no    Recent/future dental: yes     Details: meeting with dentist this week to see about having 2 wisdom teeth removed  Any missed doses?: no       Is patient compliant with meds? yes       Allergies: No Known Drug Allergies  Anticoagulation Management History:      The patient is taking warfarin and comes in today for a routine follow up visit.  Positive risk factors for bleeding include history of CVA/TIA and presence of serious comorbidities.  Negative risk factors for bleeding include an age less than 68 years old.  The bleeding index is 'intermediate risk'.  Positive CHADS2 values include History of HTN, History of Diabetes, and Prior Stroke/CVA/TIA.  Negative CHADS2 values include Age > 24 years old.  The start date was 01/11/2006.  His last INR was 1.8.  Anticoagulation responsible provider: Myrtis Ser MD, Tinnie Gens.  INR POC: 2.7.  Cuvette Lot#: [id number].  Exp: 05/2011.    Anticoagulation Management Assessment/Plan:      The patient's current anticoagulation dose is Warfarin sodium 10 mg tabs: Use as directed by anticoagulation clinic.  The target INR is 2.0-3.0.  The next INR is due 08/25/2010.  Anticoagulation instructions were given to patient.  Results were reviewed/authorized by Weston Brass, PharmD.  He was notified by Weston Brass PharmD.         Prior Anticoagulation Instructions: INR 2.0 Continue taking 1/2 tablet every day, except take 1 tablet on Mondays and Thursdays. Recheck in  4 weeks.  Current Anticoagulation Instructions: INR 2.7  Continue same dose of 1/2 tablet every day except 1 tablet on Monday and Thursday.  Recheck INR in 4 weeks.

## 2010-08-06 LAB — COMPREHENSIVE METABOLIC PANEL
AST: 19 U/L (ref 0–37)
BUN: 9 mg/dL (ref 6–23)
CO2: 28 mEq/L (ref 19–32)
Calcium: 8.6 mg/dL (ref 8.4–10.5)
Chloride: 104 mEq/L (ref 96–112)
Creatinine, Ser: 0.94 mg/dL (ref 0.4–1.5)
GFR calc Af Amer: >60 mL/min (ref >60)
GFR calc non Af Amer: >60 mL/min (ref >60)
Glucose, Bld: 148 mg/dL — ABNORMAL HIGH (ref 70–99)
Total Bilirubin: 1 mg/dL (ref 0.3–1.2)

## 2010-08-06 LAB — RAPID URINE DRUG SCREEN, HOSP PERFORMED
Amphetamines: NOT DETECTED
Cocaine: NOT DETECTED
Opiates: POSITIVE — AB
Tetrahydrocannabinol: NOT DETECTED

## 2010-08-06 LAB — MRSA PCR SCREENING: MRSA by PCR: NEGATIVE

## 2010-08-06 LAB — PROTIME-INR
INR: 1.59 — ABNORMAL HIGH (ref 0.00–1.49)
Prothrombin Time: 19.1 seconds — ABNORMAL HIGH (ref 11.6–15.2)
Prothrombin Time: 20 seconds — ABNORMAL HIGH (ref 11.6–15.2)

## 2010-08-06 LAB — CBC
Hemoglobin: 13.2 g/dL (ref 13.0–17.0)
MCH: 28.9 pg (ref 26.0–34.0)
MCHC: 33.5 g/dL (ref 30.0–36.0)
MCHC: 34.5 g/dL (ref 30.0–36.0)
MCV: 83.8 fL (ref 78.0–100.0)
Platelets: 166 10*3/uL (ref 150–400)
RDW: 16.1 % — ABNORMAL HIGH (ref 11.5–15.5)
WBC: 6 10*3/uL (ref 4.0–10.5)

## 2010-08-06 LAB — LIPID PANEL
Cholesterol: 117 mg/dL (ref 0–200)
HDL: 30 mg/dL — ABNORMAL LOW (ref >39)
LDL Cholesterol: 42 mg/dL (ref 0–99)
Total CHOL/HDL Ratio: 3.9 RATIO

## 2010-08-06 LAB — URINALYSIS, ROUTINE W REFLEX MICROSCOPIC
Bilirubin Urine: NEGATIVE
Glucose, UA: NEGATIVE mg/dL
Ketones, ur: NEGATIVE mg/dL
Protein, ur: 30 mg/dL — AB
pH: 5.5 (ref 5.0–8.0)

## 2010-08-06 LAB — DIFFERENTIAL
Basophils Absolute: 0 10*3/uL (ref 0.0–0.1)
Basophils Relative: 0 % (ref 0–1)
Lymphocytes Relative: 25 % (ref 12–46)
Neutro Abs: 3.7 10*3/uL (ref 1.7–7.7)
Neutrophils Relative %: 61 % (ref 43–77)

## 2010-08-06 LAB — POCT I-STAT, CHEM 8
BUN: 10 mg/dL (ref 6–23)
Calcium, Ion: 1.16 mmol/L (ref 1.12–1.32)
Hemoglobin: 15 g/dL (ref 13.0–17.0)
TCO2: 30 mmol/L (ref 0–100)

## 2010-08-06 LAB — CARDIAC PANEL(CRET KIN+CKTOT+MB+TROPI)
CK, MB: 0.8 ng/mL (ref 0.3–4.0)
CK, MB: 0.9 ng/mL (ref 0.3–4.0)
Relative Index: 0.4 (ref 0.0–2.5)
Troponin I: 0.01 ng/mL (ref 0.00–0.06)

## 2010-08-06 LAB — D-DIMER, QUANTITATIVE: D-Dimer, Quant: 0.56 ug/mL-FEU — ABNORMAL HIGH (ref 0.00–0.48)

## 2010-08-06 LAB — GLUCOSE, CAPILLARY: Glucose-Capillary: 177 mg/dL — ABNORMAL HIGH (ref 70–99)

## 2010-08-06 LAB — POCT CARDIAC MARKERS
CKMB, poc: <1 ng/mL — ABNORMAL LOW (ref 1.0–8.0)
Troponin i, poc: <0.05 ng/mL (ref 0.00–0.09)

## 2010-08-25 ENCOUNTER — Ambulatory Visit (INDEPENDENT_AMBULATORY_CARE_PROVIDER_SITE_OTHER): Payer: Medicare Other | Admitting: *Deleted

## 2010-08-25 DIAGNOSIS — I359 Nonrheumatic aortic valve disorder, unspecified: Secondary | ICD-10-CM

## 2010-08-25 DIAGNOSIS — Z8679 Personal history of other diseases of the circulatory system: Secondary | ICD-10-CM

## 2010-08-25 DIAGNOSIS — I4891 Unspecified atrial fibrillation: Secondary | ICD-10-CM

## 2010-08-25 DIAGNOSIS — G459 Transient cerebral ischemic attack, unspecified: Secondary | ICD-10-CM

## 2010-08-25 LAB — POCT INR: INR: 2.4

## 2010-08-25 NOTE — Patient Instructions (Signed)
INR 2.4  Continue same dose of 1/2 tablet every day except 1 tablet on Monday and Thursday.  Recheck INR in 4 weeks.

## 2010-09-07 LAB — URINALYSIS, ROUTINE W REFLEX MICROSCOPIC
Glucose, UA: NEGATIVE mg/dL
Nitrite: NEGATIVE
Protein, ur: 30 mg/dL — AB
Specific Gravity, Urine: 1.02 (ref 1.005–1.030)
Specific Gravity, Urine: 1.027 (ref 1.005–1.030)
Urobilinogen, UA: 1 mg/dL (ref 0.0–1.0)
pH: 5.5 (ref 5.0–8.0)

## 2010-09-07 LAB — URINE MICROSCOPIC-ADD ON

## 2010-09-07 LAB — POCT I-STAT, CHEM 8
Glucose, Bld: 219 mg/dL — ABNORMAL HIGH (ref 70–99)
HCT: 42 % (ref 39.0–52.0)
Hemoglobin: 14.3 g/dL (ref 13.0–17.0)
Potassium: 3.6 mEq/L (ref 3.5–5.1)
Sodium: 141 mEq/L (ref 135–145)
TCO2: 28 mmol/L (ref 0–100)

## 2010-09-07 LAB — DIFFERENTIAL
Eosinophils Absolute: 0.4 10*3/uL (ref 0.0–0.7)
Eosinophils Relative: 5 % (ref 0–5)
Lymphs Abs: 1.9 10*3/uL (ref 0.7–4.0)
Monocytes Absolute: 0.6 10*3/uL (ref 0.1–1.0)
Monocytes Relative: 9 % (ref 3–12)

## 2010-09-07 LAB — CBC
HCT: 43.5 % (ref 39.0–52.0)
MCV: 83.6 fL (ref 78.0–100.0)
Platelets: 154 10*3/uL (ref 150–400)
WBC: 6.9 10*3/uL (ref 4.0–10.5)

## 2010-09-07 LAB — BASIC METABOLIC PANEL
BUN: 16 mg/dL (ref 6–23)
Chloride: 103 mEq/L (ref 96–112)
Potassium: 3.7 mEq/L (ref 3.5–5.1)

## 2010-09-07 LAB — PROTIME-INR
Prothrombin Time: 28.6 seconds — ABNORMAL HIGH (ref 11.6–15.2)
Prothrombin Time: 34.8 seconds — ABNORMAL HIGH (ref 11.6–15.2)

## 2010-09-07 LAB — URINE CULTURE: Culture: NO GROWTH

## 2010-09-09 ENCOUNTER — Other Ambulatory Visit: Payer: Self-pay | Admitting: Internal Medicine

## 2010-09-22 ENCOUNTER — Ambulatory Visit (INDEPENDENT_AMBULATORY_CARE_PROVIDER_SITE_OTHER): Payer: Medicare Other | Admitting: *Deleted

## 2010-09-22 DIAGNOSIS — G459 Transient cerebral ischemic attack, unspecified: Secondary | ICD-10-CM

## 2010-09-22 DIAGNOSIS — Z8679 Personal history of other diseases of the circulatory system: Secondary | ICD-10-CM

## 2010-09-22 DIAGNOSIS — I4891 Unspecified atrial fibrillation: Secondary | ICD-10-CM

## 2010-09-22 DIAGNOSIS — I359 Nonrheumatic aortic valve disorder, unspecified: Secondary | ICD-10-CM

## 2010-10-05 NOTE — Discharge Summary (Signed)
[name], [name]            ACCOUNT NO.:  [id number]   MEDICAL RECORD NO.:  [id number]          PATIENT TYPE:  INP   LOCATION:  5703                         FACILITY:  [facility]   PHYSICIAN:  Valerie A. Felicity Coyer, MDDATE OF BIRTH:  10/19/1965   DATE OF ADMISSION:  01/06/2007  DATE OF DISCHARGE:  01/12/2007                               DISCHARGE SUMMARY   ADDENDUM:  The patient said he did not have prescription coverage to cover his  Lantus insulin.  Upon further investigation per case management, he does  have prescription coverage and his copay for 29-month supply of Lantus is  5 dollars which he states he will be able to pay.  Therefore we will  continue on Lantus regimen as we had initially planned which is 50 units  subcu in the morning and 20 units subcu in the evening.      Sandford Craze, NP      Raenette Rover. Felicity Coyer, MD  Electronically Signed    MO/MEDQ  D:  01/12/2007  T:  01/13/2007  Job:  [id number]

## 2010-10-05 NOTE — Discharge Summary (Signed)
NAMETAKASHI, KOROL            ACCOUNT NO.:  [id number]   MEDICAL RECORD NO.:  [id number]          PATIENT TYPE:  INP   LOCATION:  5703                         FACILITY:  [facility]   PHYSICIAN:  Valerie A. Felicity Coyer, MDDATE OF BIRTH:  [birth year]   DATE OF ADMISSION:  01/06/2007  DATE OF DISCHARGE:                               DISCHARGE SUMMARY   ADDENDUM   The patient's discharge wad held, as his blood glucose prior to lunch  was greater than 300.  His Lantus was increased on August 21 to 50 units  subcutaneously daily.  CBG this morning is 291.  We will increase his  daily Lantus to 55 units.  He will likely need further titration upward  as an outpatient.  In addition, his followup appointments have been  rearranged and are as follows:  He is to follow up with Dr. Thomos Lemons  on Monday, August 25, at 1:15 pm and to follow up in the Coumadin clinic  on August 27 at 10:15 a.m.      Sandford Craze, NP      Raenette Rover. Felicity Coyer, MD  Electronically Signed    MO/MEDQ  D:  01/11/2007  T:  01/11/2007  Job:  [id number]   cc:   Barbette Hair. Artist Pais, DO

## 2010-10-05 NOTE — Assessment & Plan Note (Signed)
[facility]                                 ON-CALL NOTE   [name], ARIZOLA                     MRN:          [id number]  DATE:05/24/2007                            DOB:          08/20/1965    Mr. Swatzell, cell phone number [phone], stated that he had seen  Dr. Artist Pais yesterday and was told to add labetalol to his medication  regimen.  He realized that he did not have a current prescription and  called the cardiology office requesting one.   I reviewed Mr. Repsher medications with him.  He stated that he is  taking all of his medications as prescribed, which include Toprol XL 200  mg daily.  Upon admission to the hospital in August of 2008, he was  supposed to be on Toprol XL 200 and labetalol 400 mg b.i.d., however,  Mr. Hamrick stated he had never actually gotten the labetalol  prescription filled.  He stated that at home his blood pressure is  in  the 110's to 120's but in the doctor's office the other day his blood  pressure was approximately 140/90 and Dr. Artist Pais felt his medications  should be adjusted.  I advised Mr. Scarlata that I would defer adding  labetalol to Dr. Artist Pais.  I also advised him to please check his blood  pressure regularly as he said he could do with a home blood pressure  machine and I will leave a message for him to get nurse visit next week.  If his blood pressure is truly running normal at home, then possibly no  further medication changes are to be made.  If his blood pressure is  abnormal at home then consideration can be given to adding back the  amlodipine that he was taking previously but had been stopped during his  last hospitalization.      Theodore Demark, PA-C  Electronically Signed      Noralyn Pick. Eden Emms, MD, [facility]  Electronically Signed   RB/MedQ  DD: 05/24/2007  DT: 05/24/2007  Job #: [id number]

## 2010-10-05 NOTE — Assessment & Plan Note (Signed)
[facility]                            CARDIOLOGY OFFICE NOTE   [name], [name]                     MRN:          [id number]  DATE:11/09/2006                            DOB:          17-Apr-1966    PRIMARY CARE PHYSICIAN:  Barbette Hair. Artist Pais, DO.   PATIENT IDENTIFICATION:  [name] is a very pleasant 45 year old  male with a history of an ascending aortic dissection with aortic valve  involvement status post mechanical AVR with ascending aorta conduit.  Also has a history of morbid obesity, hypertension, and diabetes, as  well as hyperlipidemia.  He returns today for routine followup.   Overall, he says he is doing fairly well.  He denies having any chest  pain or shortness of breath.  He has not been very active and is  watching a lot of TV.  He does note that he has had some mild lower  extremity edema as well as some bruising, but no overt bleeding.   CURRENT MEDICATIONS:  1. Lisinopril 10 mg a day.  2. Aspirin 81 a day.  3. Hyzaar 100/25.  4. Glucovance 5/500 b.i.d.  5. Pravastatin 40 a day.  6. Labetalol 400 b.i.d.  7. Clonidine 0.1 b.i.d.  8. Amlodipine 10 a day.  9. Glimepiride 1 mg a day.   PHYSICAL EXAM:  He is an obese male in no acute distress.  He ambulates  around the clinic slowly, but with no respiratory difficulty.  Blood pressure is 132/92, heart rate is 68, weight 331, which is up 10  pounds from previous.  HEENT:  Normal.  NECK:  Supple and thick.  Unable to assess JVD.  Carotids are 2+  bilaterally without any bruits.  There is no lymphadenopathy or  thyromegaly.  CARDIAC:  PMI is not palpable.  He has a regular rate and rhythm with no  obvious murmurs, rubs, or gallops.  LUNGS:  Clear.  ABDOMEN:  Morbidly obese.  No obvious bruits or masses.  I am unable to  palpate for any hepatosplenomegaly.  Good bowel sounds.  EXTREMITIES:  Warm with no cyanosis or clubbing.  There is trace edema  bilaterally.  No rash.  NEURO:  He is alert and oriented x3.  Cranial nerves 2-12 are intact.  Moves all 4 extremities without difficulty.   EKG:  Shows normal sinus rhythm rate of 68 with LVH and repolarization  abnormalities.   ASSESSMENT AND PLAN:  1. Status post aortic valve replacement.  Echocardiogram September of      2007 showed a well-functioning prosthesis with a mean valve      gradient of 25, which is a little bit higher than I would expect.      There was some evidence of right ventricular dilatation with normal      function.  Plan will be a repeat ultrasound on a yearly basis.  2. Hypertension, poorly controlled.  Long talk with him about the need      for more exercise, weight loss, and to avoid salt.  I am not sure      he will be very  compliant with this.  We will increase his      clonidine to 0.2 b.i.d.  3. Lower extremity edema.  I have given him a prescription for Lasix      20 mg p.r.n. and potassium 20 mEq.  I am concerned that he may have      early right-sided heart failure related to obesity hypoventilation      syndrome.  We did talk to him about the possibility of a sleep      study, but he denies any daytime sleepiness, although his wife says      he does snore.  Will go ahead and refer him over to the sleep      clinic for evaluation.   DISPOSITION:  Return to clinic in 1 year.     Bevelyn Buckles. Bensimhon, MD  Electronically Signed    DRB/MedQ  DD: 11/09/2006  DT: 11/09/2006  Job #: [id number]

## 2010-10-05 NOTE — H&P (Signed)
[name], [name]            ACCOUNT NO.:  [id number]   MEDICAL RECORD NO.:  [id number]          PATIENT TYPE:  OBV   LOCATION:  5703                         FACILITY:  [facility]   PHYSICIAN:  Barbette Hair. Artist Pais, DO      DATE OF BIRTH:  [birth year]   DATE OF ADMISSION:  01/06/2007  DATE OF DISCHARGE:                              HISTORY & PHYSICAL   CHIEF COMPLAINT:  Vomiting and weakness with elevated blood sugars.   HISTORY:  The patient is a 45 year old male with a history of type 2  diabetes and hypertension.  Says that he has been taking his medication  regularly.  He is complaining of vomiting, blurry vision and weakness.  Today found to have sugars over 800.  He had normal chemistries, no  acidosis.  Lipase and liver function normal.  Denies any fever.  No  nausea, vomiting, diarrhea prior.  No cough, no chest pain, no shortness  of breath.   PAST MEDICAL HISTORY:  No known drug allergies.   CURRENT MEDICATIONS:  1. Aspirin 81 mg daily.  2. Coumadin 5 mg daily.  3. Glucotrol 5 mg b.i.d.  4. Hyzaar 100/25 one a day.  5. Lasix 20 mg daily.  6. Lisinopril 10 mg daily.  7. Clonidine 0.1 b.i.d.  8. Amaryl 1 mg daily.  9. Labetalol 400 mg b.i.d.  10.Metformin 500 b.i.d.  11.Toprol 200 mg daily.  12.Potassium 20 mEq daily  13.Norvasc 10 mg daily.  14.Pravachol 40 mg daily.   MEDICAL PROBLEMS:  1. Hypertension.  2. Coronary artery disease.  3. CABG.  4. Type 2 diabetes.   SURGICAL HISTORY:  1. CABG.  2. Appendectomy.   SOCIAL HISTORY:  1. No tobacco.  2. No alcohol.  3. He lives with girlfriend.  4. Has a daughter.  5. On disability.   FAMILY HISTORY:  Noncontributory.   EXAM:  Blood pressure 133/91, temperature 98.8, pulse 78, respirations  18, 97% room air.  Awake and alert.  No acute distress.  Watching t.v.  Pupils reactive.  Extraocular muscles intact.  ABDOMEN:  Soft, positive bowel sounds, without any tenderness.  LUNGS:  Clear.  CARDIAC EXAM:  Regular  S1, S2 without any murmurs.  NEURO EXAM:  Nonfocal.  EXTREMITIES:  No edema.   LAB DATA:  White count of 7.6. hemoglobin 15.6.  Chemistry:  Sodium 129,  potassium 3.9, bicarbonate of 27.  BUN 22, creatinine 1.6, glucose was  183.  UA was negative.   IMPRESSION:  A 45 year old with hypertension, diabetes, coronary artery  disease with hyperglycemia, vomiting and weakness secondary to elevated  sugars.  No evidence of any acidosis or diabetic ketoacidosis.  Normal  white count.  No evidence of infection.  1. Hyperglycemia.  Plan:  Admit to telemetry, IV fluids, clear      liquids, Glucomander for blood sugars, hold his diuretics and oral      agents for now.  Follow his blood chemistries and potassium.  2. Hypertension.  Continue on his Toprol, labetalol and Hyzaar.  I      will hold his Lasix and lisinopril.  He is  on Coumadin.  Will      continue with this.  Pharmacy to dose.  He is on therapeutic INR.      Diabetic and nutrition teaching.      Georgann Housekeeper, MD  Electronically Signed      Barbette Hair. Artist Pais, DO  Electronically Signed    KH/MEDQ  D:  01/07/2007  T:  01/07/2007  Job:  [id number]

## 2010-10-05 NOTE — Discharge Summary (Signed)
NAMEPEARSE, SHIFFLER            ACCOUNT NO.:  [id number]   MEDICAL RECORD NO.:  [id number]          PATIENT TYPE:  INP   LOCATION:  5703                         FACILITY:  [facility]   PHYSICIAN:  Valerie A. Felicity Coyer, MDDATE OF BIRTH:  07-14-1965   DATE OF ADMISSION:  01/06/2007  DATE OF DISCHARGE:                               DISCHARGE SUMMARY   ADDENDUM:  The patient states he is unable to afford Lantus insulin.  He  apparently, according to case management, does not qualify for any type  of drug assistance while he is in the hospital.  At this time, plan is  to transition the patient over to Levemir, and I spoke with Dr. Artist Pais.  His office currently has samples of Levemir.  The patient will be  instructed to stop by the office this afternoon to pick up the Levemir  sample.  At his followup appointment with Dr. Thomos Lemons next week,  consider transfer back over to Lantus insulin if Lantus samples become  available.  At this time, we will instruct the patient to take Levemir  40 units subcu every 12 hours until followup with Dr. Thomos Lemons.      Sandford Craze, NP      Raenette Rover. Felicity Coyer, MD  Electronically Signed    MO/MEDQ  D:  01/12/2007  T:  01/12/2007  Job:  [id number]   cc:   Barbette Hair. Artist Pais, DO

## 2010-10-05 NOTE — Discharge Summary (Signed)
[name], [name]            ACCOUNT NO.:  [id number]   MEDICAL RECORD NO.:  [id number]          PATIENT TYPE:  OBV   LOCATION:  5703                         FACILITY:  [facility]   PHYSICIAN:  Barbette Hair. Artist Pais, DO      DATE OF BIRTH:  09/02/1965   DATE OF ADMISSION:  01/06/2007  DATE OF DISCHARGE:  01/10/2007                               DISCHARGE SUMMARY   DISCHARGE DIAGNOSES:  1. Diabetes type 2 status post hyperosmolar non-ketotic, resolved.      Hemoglobin A1c 11.7.  2. Pseudohyponatremia in setting of hyperglycemia, resolved.  3. Hypokalemia, resolved status post p.o. repletion.  4. Hypertension with episode of hypotension during this admission;      home Norvasc currently on hold.  5. History of aortic valve replacement with therapeutic INR.   Dictation ended at this point.      Sandford Craze, NP      Barbette Hair. Artist Pais, DO     MO/MEDQ  D:  01/10/2007  T:  01/10/2007  Job:  [id number]

## 2010-10-05 NOTE — Discharge Summary (Signed)
[name], [name]            ACCOUNT NO.:  [id number]   MEDICAL RECORD NO.:  [id number]          PATIENT TYPE:  OBV   LOCATION:  5703                         FACILITY:  [facility]   PHYSICIAN:  Valerie A. Felicity Coyer, MDDATE OF BIRTH:  1965-10-05   DATE OF ADMISSION:  01/06/2007  DATE OF DISCHARGE:  01/10/2007                               DISCHARGE SUMMARY   CONTINUATION OF DISCHARGE SUMMARY:   HISTORY OF PRESENT ILLNESS:  [name] is a 45 year old male who was  admitted with vomiting, weakness, and hyperglycemia on January 06, 2007.  He has a known history of type 2 diabetes and notes that he had been  taking his medications regularly.  He complained of vomiting, blurry  vision, and weakness and was known to have sugars greater than 800 on  the day of admission without any acidosis.  He was admitted for further  evaluation and treatment.   PAST MEDICAL HISTORY:  1. Hypertension.  2. Coronary artery disease.  3. CABG.  4. Diabetes type 2.   COURSE OF HOSPITALIZATION:  1. Diabetes type 2 status post HONK:  The patient was admitted and was      placed initially on Glucommander protocol.  He was then      successfully transitioned to Lantus and sliding scale coverage.  He      was noted to have an A1c of 11.7 this admission and has been      instructed on insulin self administration.  Fasting CBG this      morning was 277.  We will repeat before lunch, and if stable, plan      to discharge the patient to home this afternoon.  He has been      receiving Lantus 40 units.  We will increase this to 45 at the time      of discharge.  He is instructed to keep a log of his blood sugars      and bring this along with him to his appointment with along with      Dr. Artist Pais on Friday.  In addition, we will ask for home health RN to      assist the patient in the home, transitioning to new insulin      administration.  2. Hypertension:  The patient was noted to have some mild hypotension   earlier in this admission requiring that several of his blood      pressure medications be held at this time.  We will resume home      blood pressure medications with the exception of Norvasc.  This may      continue at outpatient followup.   MEDICATIONS AT THE TIME OF DISCHARGE:  1. Coumadin 5 mg p.o. daily.  2. Toprol XL 200 mg p.o. daily.  3. Clonidine 0.2 mg p.o. b.i.d.  4. K-Dur 20 mEq p.o. daily.  5. Lasix 20 mg p.o. daily.  6. Pravachol 40 mg p.o. daily.  7. Aspirin 81 mg p.o. daily.  8. Lisinopril 10 mg p.o. daily.  9. Glucotrol XL 10 mg p.o. b.i.d.  10.Glucophage 1000 mg p.o. b.i.d.  11.Lantus  insulin 40 units subcu injection once daily.  12.Hyzaar 100/25 1 tab p.o. daily.  13.Insulin syringes, dispense 1 box with 1 refill.  14.Prescription provided for test strips, Lantus, alcohol swabs, and      glucometer.  15.The patient is instructed to discontinue glimepiride and amlodipine      until further instructions per Dr. Artist Pais.   PERTINENT LABORATORY AT THE TIME OF DISCHARGE:  BUN 14, creatinine 1.1.  Sodium 133, INR 2.4, hemoglobin 14, hematocrit 42, A1c 11.7.   DISPOSITION:  Plan to discharge the patient to home.  We will request  outpatient diabetes classes as well as a home health RN.  The patient is  scheduled to follow up with Dr. Thomos Lemons on Friday, August 22, at 9:30  a.m. and with Coumadin clinic on Friday, August 22, at 12:15 p.m.  He is  instructed to call Dr. Artist Pais for sugars greater than 350 or less than 80.  He is to check sugars before meals and at bedtime and required to bring  his log book with him to his appointment with Dr. Thomos Lemons.      [name], [name]      Raenette Rover. Felicity Coyer, MD  Electronically Signed    MO/MEDQ  D:  01/10/2007  T:  01/10/2007  Job:  [id number]   cc:   Barbette Hair. Artist Pais, DO

## 2010-10-05 NOTE — Assessment & Plan Note (Signed)
[facility]                            CARDIOLOGY OFFICE NOTE   ISHMEAL, RORIE                     MRN:          [id number]  DATE:11/22/2007                            DOB:          1965-11-15    PRIMARY CARE PHYSICIAN:  Barbette Hair. Artist Pais, DO   INTERVAL HISTORY:  Mr. Guttman is a very pleasant 45 year old male with  a history of spontaneous ascending aortic dissection with aortic valve  involvement status post mechanical AVR with ascending aorta conduit.  He  also has a history of morbid obesity, hypertension, diabetes, and  hyperlipidemia.  He returns today for his yearly followup.  He is  feeling well.  He walks 2 miles several days a week without any chest  pain or dyspnea.  He unfortunately lost his wife to sudden cardiac  death, just a few months ago.  He has not had any problems with heart  failure symptoms.   CURRENT MEDICATIONS:  1. Coumadin 5 mg a day.  2. Aspirin 81 a day.  3. Hyzaar 100/25.  4. Amlodipine 10 a day.  5. Pravastatin 40 a day.  6. Metformin 1000 b.i.d.  7. Lantus insulin.  8. Potassium.  9. Labetalol 400 b.i.d.  10.Clonidine 0.2 b.i.d.  11.Toprol 200 a day.   PHYSICAL EXAMINATION:  GENERAL:  He is an obese male, in no acute  distress, ambulatory in the clinic without respiratory difficulty.  VITAL SIGNS:  Blood pressure is 126/88, heart rate 56, weight is 327,  which is down 4 pounds from previous.  HEENT:  Normal.  NECK:  Supple and thick, unable to assess JVD.  Carotids are 2+  bilaterally.  He has bilateral radiated bruits.  There is no  lymphadenopathy or thyromegaly.  CARDIAC:  PMI is not palpable.  He is bradycardic and regular with a  mechanical S2.  There is a 2/6 systolic ejection murmur over the valve.  LUNGS:  Clear.  ABDOMEN:  Morbidly obese.  No bruits or masses.  Unable to appreciate  any hepatosplenomegaly.  Good bowel sounds.  EXTREMITIES:  Warm with no cyanosis or clubbing.  There is trace  edema  bilaterally, which is chronic.  No rash.  He is alert and oriented x3.  Cranial nerves II-XII are intact.  Moves all 4 extremities without  difficulty.  Affect is pleasant.   EKG shows sinus bradycardia at a rate of 56.  He has small inferior Q  waves in III and F, which are old.   On looking back over his echo from last December, the aortic valve  looked to be doing well except there was no gradient average was  slightly high at 16 mmHg and there was only trivial AI.   ASSESSMENT AND PLAN:  1. Status post aortic valve replacement.  He is doing well.  His      previous gradient across the valve was 25, but on last year's echo,      it was only 16.  We will continue with yearly echocardiograms.      Valve seems to be functioning well.  2. Hypertension.  Systolic  blood pressure seems to be okay.  His      diastolic are little bit high.  He is followed by Dr. Artist Pais.  3. Hyperlipidemia also followed by Dr. Artist Pais given his diabetes goal LDL      is less than 70.   DISPOSITION:  We will see him back in 6 months for his yearly  echocardiogram and then will see him on a yearly basis after that.     Bevelyn Buckles. Bensimhon, MD  Electronically Signed    DRB/MedQ  DD: 11/22/2007  DT: 11/23/2007  Job #: [id number]

## 2010-10-08 NOTE — Assessment & Plan Note (Signed)
[facility]                            CARDIOLOGY OFFICE NOTE   BOSTEN, NEWSTROM                     MRN:          [id number]  DATE:05/12/2006                            DOB:          August 23, 1965    REFERRING PHYSICIAN:  Barbette Hair. Artist Pais, DO   PATIENT IDENTIFICATION:  [name] is a very pleasant 45 year old  male, who returns for routine followup.   PROBLEM LIST:  1. History of ascending aortic dissection with aortic valve      involvement, status post mechanical AVR with ascending aorta      conduit.  Normal coronaries at the time.  2. Morbid obesity.  3. Hypertension.  4. Diabetes.  5. TIA.  6. Hyperlipidemia.   CURRENT MEDICATIONS:  1. Lisinopril 10 a day.  2. Coumadin.  3. Norvasc 10 a day.  4. Aspirin 81.  5. Hyzaar 100/25.  6. Glucovance 5/500 b.i.d.  7. Pravastatin 20 a day.  8. Labetalol 400 b.i.d.   INTERVAL HISTORY:  [name] returns today for routine followup.  He  had an echocardiogram since I last saw him, which showed normal LV  function with a stable aortic valve prosthesis and conduit.  There was  trivial AI.  Pulmonary artery pressure was mildly elevated at 35.  Otherwise, he is doing well.  He continues to gain weight.  He is about  15 pounds up, but he denies any chest pain or shortness of breath, no  lower extremity edema.  He says he does snore some, but gets by on two  hours of sleep a night, without any significant daytime fatigue.   PHYSICAL EXAMINATION:  He is well-appearing, in no acute distress.  He  ambulates around the clinic without any respiratory difficulty.  Blood  pressure is 155/106 in the left arm, heart rate 72, his weight is 322,  which is up 15 pounds.  HEENT:  Sclerae are anicteric.  EOMI.  There are no xanthelasmas.  Mucous membranes are moist.  NECK:  The neck is supple, no JVD.  Carotids are 2+ bilaterally with  bilateral radiated bruits off his aortic valve.  No lymphadenopathy  or  thyromegaly.  CARDIAC:  He has a regular rate and rhythm.  There is a soft systolic  murmur across the aortic valve.  There is a mechanical click, which is  crisp.  No rub or gallop.  LUNGS:  Clear.  ABDOMEN:  Obese, nontender, nondistended.  No apparent  hepatosplenomegaly.  No bruits, no masses.  EXTREMITIES:  Warm with no cyanosis, clubbing or edema, good distal  pulses.  He does have an olecranon bursitis on his left elbow.  NEUROLOGIC:  Alert and oriented times three.  Cranial nerves II through  XII are intact.  Moves all four extremities without difficulty.   ASSESSMENT AND PLAN:  1. Status post aortic valve replacement:  He is stable.      Echocardiogram looks fine.  We will continue his Coumadin.  He will      need a yearly echocardiogram.  2. Hypertension:  I had a long talk with him about his blood  pressure.      We had considered increasing labetalol, but I do not think this      will be enough to bring his blood pressure down.  We will add      clonidine 0.1 b.i.d.  I reinforced the need for weight-loss.  3. Hyperlipidemia in the setting of diabetes:  We need to get his LDL      under 70, so we will increase his pravastatin to 40 a day.  4. Morbid obesity:  Once again, I had a long talk with him about the      risks of obesity and encouraged him to be more active with a      walking program and attempt to lose one pound per week.  5. Question sleep apnea:  He is at high risk for sleep apnea and does      have snoring, but denies any daytime fatigue.  We will hold off on      a sleep study at this point.  I will leave that to the discretion      of Dr. Artist Pais.   DISPOSITION:  We will see him back in six months for routine followup.     Bevelyn Buckles. Bensimhon, MD  Electronically Signed    DRB/MedQ  DD: 05/12/2006  DT: 05/12/2006  Job #: [id number]   cc:   Barbette Hair. Artist Pais, DO

## 2010-10-08 NOTE — Assessment & Plan Note (Signed)
[facility]                             PRIMARY CARE OFFICE NOTE   KAINAN, PATTY                         MRN:          [id number]  DATE:01/16/2006                            DOB:          10-29-1965    CHIEF COMPLAINT:  New patient to practice.   HISTORY OF PRESENT ILLNESS:  Patient is a 45 year old African-American male  who has moved to [place] from [place], [place] within the last  five months time period.  He has a past medical history significant for  coronary artery disease.  He is status post CABG in the year 2000 x2.  Also,  he underwent aortic valve replacement.  The patient is somewhat of a poor  historian, unclear whether he suffered from aortic dissection, which led to  aortic valve replacement.  In addition to his history of coronary disease,  he was diagnosed with type 2 diabetes about two years ago.  He states that  he was very somnolent, and he was found by EMS at home with blood sugars in  the 900s.  He was initially placed on insulin therapy but has been able to  control his diabetes with oral medications.  Currently, his blood sugars are  reported between 70s and 140s.  He does have occasional lows with blood  sugars in the 60s.   He initially had an eye exam when his diabetes was found but since then has  not had any follow-up eye exams.  He does not note any changes in his  vision.  He seems to be aware of some of the long term complications of  diabetes.  He states that he did have a carotid Doppler in the past.  Does  not report any numbness or tingling in his hands and feet.  No symptoms of  claudication.  Currently is chest painfree.   PAST MEDICAL HISTORY:  1. Coronary artery disease, status post CABG in the year 2000 x2.  Status      post aortic valve repair for questionable aortic dissection.  2. Hypertension.  3. Type 2 diabetes.  4. History of mini stroke in the past.  5. History of  nephrolithiasis.  6. Status post appendectomy in 1998.   CURRENT MEDICATIONS:  1. Lisinopril 10 mg once daily.  2. Glucovance 5/500 1 p.o. b.i.d.  3. Coumadin 11 mg once daily.  4. Amlodipine 5 mg once daily.  5. Aspirin 81 mg once daily.  6. Hyzaar 100/25 once daily.  7. Toprol XL 200 mg once daily.   ALLERGIES TO MEDICATIONS:  None known.   SOCIAL HISTORY:  Patient is separated from his wife.  He has four children.  His youngest daughter lives with him.  His wife lives in [place].  His three  other teenage daughters live on their own.  He is currently disabled.   FAMILY HISTORY:  Mother is age 65, has hypertension and diabetes.  Father is  in his 29s with a history of CABG and coronary artery disease.  His brother  is also known to be diabetic.  He denies  any cancer in the family.   HABITS:  No alcohol.  No tobacco.  He denies any recreational drug use.   REVIEW OF SYSTEMS:  As noted above.  No fevers or chills.  No HEENT  symptoms.  He states that he does try to avoid salt.  He denies any chest  pain, shortness of breath.  No numbness or tingling.  All other systems are  negative.   PHYSICAL EXAMINATION:  VITAL SIGNS:  Height is 6 feet 2.  Weight is 312  pounds.  Temperature 98.2, pulse 53, BP 139/93 in a seated position in the  left arm.  GENERAL:  The patient is a pleasant, morbidly obese African-American male in  no apparent distress.  HEENT:  Normocephalic and atraumatic.  Pupils are equal and reactive to  light bilaterally.  Extraocular motility was intact.  Patient was anicteric.  Conjunctivae were within normal limits.  External auditory canals intact.  Tympanic membranes are clear bilaterally.  Hearing is grossly normal.  Oropharyngeal exam is unremarkable.  NECK:  Supple.  No adenopathy.  No thyromegaly.  Positive bruit, likely  radiation from his aortic valve.  LUNGS:  Normal respiratory effort.  Chest is clear to auscultation  bilaterally.  No rales, rhonchi or  wheezes.  CARDIOVASCULAR:  Regular rate and rhythm.  A positive click from aortic  valve with a systolic ejection murmur at the left sternal border.  ABDOMEN:  Protuberant.  Patient has right lower quadrant scar.  No  organomegaly.  Positive bowel sounds.  MUSCULOSKELETAL:  No clubbing, cyanosis or edema.  Patient had dorsalis  pedis pulses.  SKIN:  Warm and dry.  NEUROLOGIC:  Cranial nerves II-XII are grossly intact.  He is nonfocal.   ASSESSMENT/PLAN:  1. History of coronary artery disease, status post coronary artery bypass      graft.  2. History of aortic valve replacement, on Coumadin.  3. Hypertension, suboptimal control.  4. Type 2 diabetes, unknown status.  5. Morbid obesity.  6. History of mini stroke.  7. Health maintenance.   RECOMMENDATIONS:  1. We discussed pathophysiology of type 2 diabetes.  Encouraged daily      exercise.  Patient appears to be getting occasional hypoglycemia.  We      will discontinue his Glucovance, start him on Metformin 500 mg p.o.      b.i.d. and smaller doses of sulfonylurea glimepiride 1 mg a.c. a.m.      meal.  2. We will try to set up a referral to nutritionist for further diabetic      education.  3. He will be referred to [facility].  We will try to obtain      medical records from his previous cardiologist and surgeon.  4. PT/INR will be checked today as well as other follow-up labs.  Consider      his diabetes, his lipid goal would be less than 100, close to 70s for      his LDL.  He will likely need repeat referral to ophthalmologist this      year.  5. Follow-up time is approximately 2-4 weeks.                                   Barbette Hair. Artist Pais, DO   RDY/MedQ  DD:  01/16/2006  DT:  01/16/2006  Job #:  [id number]

## 2010-10-08 NOTE — Assessment & Plan Note (Signed)
[facility]                              CARDIOLOGY OFFICE NOTE   PHILOPATER, MUCHA                     MRN:          [id number]  DATE:01/19/2006                            DOB:          07/12/1965    REFERRING PHYSICIAN:  Barbette Hair. Artist Pais, DO   PATIENT IDENTIFICATION:  [name] is delightful 45 year old male with a  history of an aortic dissection with aortic valve involvement who is status  post repair.  He was previously followed in [place], [place] at  [facility] and has now moved to [place] and transferring his  care here.   HISTORY OF PRESENT ILLNESS:  Mr. Coke does not come with any formal  medical records today.  In talking to him it sounds like he had an acute  ascending aortic dissection in 2000 with involvement of aortic valve.  He  underwent repair of this with placement of an mechanical aortic valve.  It  sounds like he also had re-implantation of his coronaries but I am not clear  on that.  Since that time he is doing quite well.  He denies any chest pain  or shortness of breath.  He recently saw Dr. Artist Pais to establish primary care  and some of his diabetes medications were changed.   REVIEW OF SYSTEMS:  He has not had any palpitations.  No orthopnea.  No PND.  No lower extremity edema.  He is relatively active without any difficulty.  He is watching his diet to try to lose some weight.  He also watches his  salt.  He denies snoring or witnessed apnea.  He denies any bleeding.  Remainder of review of systems is negative except for HPI problem list.   PAST MEDICAL HISTORY:  1. Apparent ascending aortic dissection with aortic valve involvement. (A)      Status post repair of aortic dissection in addition with placement of      mechanical AVR, possible re-implantation of the coronaries in 2000.      Details are pending.  2. Morbid obesity.  3. Hypertension.  4. Diabetes times two years.  5. Transient  ischemic attack.   CURRENT MEDICATIONS:  1. Diabetes pill which has just been changed.  2. Norvasc 10 a day.  3. Lisinopril 10 a day.  4. Toprol XL 200 a day.  5. Hyzaar 100/25.  6. Coumadin.  7. Aspirin 81.   He has no known drug allergies.   SOCIAL HISTORY:  He lives in [place].  He is disabled.  He is separated.  He has three girls.  Denies any history of tobacco or alcohol use.   FAMILY HISTORY:  Mother is alive at 52 with a history of high blood pressure  and diabetes.  Father is 31 years old and does have a history of heart  problems.  He has four brothers.  One has diabetes but no heart problems.   PHYSICAL EXAMINATION:  GENERAL:  He is well appearing, in no acute distress,  ambulates around the clinic without any respiratory difficulty.  VITAL SIGNS:  Blood pressure is 139/90 with a  heart rate of 52.  His weight  is 307 pounds.  HEENT:  Sclerae anicteric.  EOMI.  There is no xanthelasma.  Mucous  membranes are moist.  NECK:  Thick.  There is no obvious JVD.  Carotids are 2+ bilaterally,  without any bruits.  There is no lymphadenopathy or thyromegaly.  CARDIAC:  He is bradycardic and regular.  He has a mechanical S2 with soft  systolic ejection murmur across mechanical valve.  The closing sound is  crisp.  LUNGS:  Clear.  ABDOMEN:  Obese, nontender, nondistended.  There is no hepatosplenomegaly,  no bruits, no masses.  EXTREMITIES:  Warm with no cyanosis, clubbing or edema.  NEURO:  He is alert and oriented times three.  Cranial nerves II through XII  are intact, moves all four extremities without difficulty.   EKG shows sinus bradycardia at a rate of 54 with LVH and anterolateral T  wave inversions.  No old one for comparison.   ASSESSMENT:  1. History of aortic dissection, status post repair including a mechanical      aortic valve.  He is asymptomatic from this point.  We will get a 2-D      echocardiogram to further evaluate.  I stressed to him the need to  have      tight control of his blood pressure to prevent recurrent dissections.  2. Hypertension.  As above this is poorly controlled.  We will switch his      Toprol over to labetalol 400 twice a day.  I suspect he may need      clonidine in the future.  Also, I informed him to watch his salt      intake.  3. Diabetes.  This will be managed by Dr. Artist Pais.  He is on an ACE inhibitor      and I do think we should probably add a statin and will add pravastatin      20 mg a day which he can get at [facility].  We will check a CMET and      lipids as well.  4. Morbid obesity.  I have suggested that he try to lose weight through      diet and exercise.   DISPOSITION:  I will see him back in several months for routine followup.                                Bevelyn Buckles. Bensimhon, MD    DRB/MedQ  DD:  01/19/2006  DT:  01/20/2006  Job #:  [id number]

## 2010-10-11 ENCOUNTER — Encounter: Payer: Self-pay | Admitting: Internal Medicine

## 2010-10-12 ENCOUNTER — Encounter: Payer: Self-pay | Admitting: Internal Medicine

## 2010-10-12 ENCOUNTER — Ambulatory Visit: Payer: Medicare Other | Admitting: Internal Medicine

## 2010-10-12 ENCOUNTER — Other Ambulatory Visit (INDEPENDENT_AMBULATORY_CARE_PROVIDER_SITE_OTHER): Payer: Medicare Other | Admitting: Internal Medicine

## 2010-10-12 ENCOUNTER — Other Ambulatory Visit (INDEPENDENT_AMBULATORY_CARE_PROVIDER_SITE_OTHER): Payer: Medicare Other

## 2010-10-12 ENCOUNTER — Ambulatory Visit (INDEPENDENT_AMBULATORY_CARE_PROVIDER_SITE_OTHER): Payer: Medicare Other | Admitting: Internal Medicine

## 2010-10-12 VITALS — BP 92/60 | HR 66 | Temp 97.9°F | Ht 74.0 in | Wt 290.2 lb

## 2010-10-12 DIAGNOSIS — E119 Type 2 diabetes mellitus without complications: Secondary | ICD-10-CM

## 2010-10-12 DIAGNOSIS — Z13220 Encounter for screening for lipoid disorders: Secondary | ICD-10-CM

## 2010-10-12 DIAGNOSIS — Z125 Encounter for screening for malignant neoplasm of prostate: Secondary | ICD-10-CM

## 2010-10-12 DIAGNOSIS — Z0000 Encounter for general adult medical examination without abnormal findings: Secondary | ICD-10-CM

## 2010-10-12 LAB — HEPATIC FUNCTION PANEL
ALT: 13 U/L (ref 0–53)
AST: 20 U/L (ref 0–37)
Albumin: 3.6 g/dL (ref 3.5–5.2)
Total Bilirubin: 1 mg/dL (ref 0.3–1.2)
Total Protein: 7.2 g/dL (ref 6.0–8.3)

## 2010-10-12 LAB — CBC WITH DIFFERENTIAL/PLATELET
Basophils Relative: 0.5 % (ref 0.0–3.0)
Eosinophils Relative: 5.8 % — ABNORMAL HIGH (ref 0.0–5.0)
HCT: 37.9 % — ABNORMAL LOW (ref 39.0–52.0)
Hemoglobin: 13.2 g/dL (ref 13.0–17.0)
Lymphs Abs: 1.3 10*3/uL (ref 0.7–4.0)
MCV: 86.3 fl (ref 78.0–100.0)
Monocytes Absolute: 0.5 10*3/uL (ref 0.1–1.0)
Neutro Abs: 3.7 10*3/uL (ref 1.4–7.7)
Platelets: 153 10*3/uL (ref 150.0–400.0)
WBC: 5.9 10*3/uL (ref 4.5–10.5)

## 2010-10-12 LAB — URINALYSIS, ROUTINE W REFLEX MICROSCOPIC
Bilirubin Urine: NEGATIVE
Hgb urine dipstick: NEGATIVE
Urine Glucose: >=1000
Urobilinogen, UA: 4 (ref 0.0–1.0)

## 2010-10-12 LAB — BASIC METABOLIC PANEL
Calcium: 9.2 mg/dL (ref 8.4–10.5)
GFR: 85.59 mL/min (ref >60.00)
Potassium: 4.4 mEq/L (ref 3.5–5.1)
Sodium: 137 mEq/L (ref 135–145)

## 2010-10-12 LAB — LIPID PANEL
Cholesterol: 137 mg/dL (ref 0–200)
HDL: 36.6 mg/dL — ABNORMAL LOW (ref >39.00)
Triglycerides: 276 mg/dL — ABNORMAL HIGH (ref 0.0–149.0)
VLDL: 55.2 mg/dL — ABNORMAL HIGH (ref 0.0–40.0)

## 2010-10-12 LAB — LDL CHOLESTEROL, DIRECT: Direct LDL: 69.8 mg/dL

## 2010-10-12 NOTE — Assessment & Plan Note (Signed)
stable overall by hx and exam, most recent lab reviewed with pt, and pt to continue medical treatment as before  Lab Results  Component Value Date   HGBA1C  Value: 7.9 (NOTE)                                                                       According to the ADA Clinical Practice Recommendations for 2011, when HbA1c is used as a screening test:   >=6.5%   Diagnostic of Diabetes Mellitus           (if abnormal result  is confirmed)  5.7-6.4%   Increased risk of developing Diabetes Mellitus  References:Diagnosis and Classification of Diabetes Mellitus,Diabetes Care,2011,34(Suppl 1):S62-S69 and Standards of Medical Care in         Diabetes - 2011,Diabetes Care,2011,34  (Suppl 1):S11-S61.* 05/27/2010

## 2010-10-12 NOTE — Progress Notes (Signed)
Subjective:    Patient ID: [name], male    DOB: 09-26-1965, 45 y.o.   MRN: [id number]  HPI Here for wellness and f/u;  Overall doing ok;  Pt denies CP, worsening SOB, DOE, wheezing, orthopnea, PND, worsening LE edema, palpitations, dizziness or syncope.  Pt denies neurological change such as new Headache, facial or extremity weakness.  Pt denies polydipsia, polyuria, or low sugar symptoms. Pt states overall good compliance with treatment and medications, good tolerability, and trying to follow lower cholesterol diet.  Pt denies worsening depressive symptoms, suicidal ideation or panic. No fever, wt loss, night sweats, loss of appetite, or other constitutional symptoms.  Pt states good ability with ADL's, low fall risk, home safety reviewed and adequate, no significant changes in hearing or vision, and occasionally active with exercise. Peak wt was 331, then lost wt from 306 to current 290, goal per pt about 230.   No other new complaints today Past Medical History  Diagnosis Date  . Diabetes mellitus   . Hypertension   . DIABETES MELLITUS, TYPE II 05/21/2007  . Unspecified vitamin D deficiency 08/07/2008  . HYPERLIPIDEMIA 08/20/2007  . HYPERTENSION 05/21/2007  . CAD 05/21/2007  . Aortic valve disorders 09/30/2009  . TIA 05/21/2007  . Gross hematuria 08/07/2008  . FATIGUE 12/26/2007  . RASH-NONVESICULAR 08/07/2008  . CHEST PAIN-UNSPECIFIED 01/13/2010  . TRANSIENT ISCHEMIC ATTACK, HX OF 12/26/2007  . NEPHROLITHIASIS, HX OF 05/21/2007  . Atrial fibrillation 07/27/2010   Past Surgical History  Procedure Date  . Appendectomy 1998  . Coronary artery bypass graft 2000    reports that he has never smoked. He does not have any smokeless tobacco history on file. He reports that he does not drink alcohol or use illicit drugs. family history includes Coronary artery disease (age of onset:50) in his other; Diabetes in his brother and mother; and Hypertension in his mother. No Known Allergies Current  Outpatient Prescriptions on File Prior to Visit  Medication Sig Dispense Refill  . amLODipine (NORVASC) 10 MG tablet Take 10 mg by mouth daily.        [name] aspirin 81 MG EC tablet Take 81 mg by mouth daily.        . Cholecalciferol (VITAMIN D3) 1000 UNITS CAPS Take by mouth daily.        . cloNIDine (CATAPRES) 0.2 MG tablet take 1 tablet by mouth twice a day  60 tablet  5  . furosemide (LASIX) 40 MG tablet Take 40 mg by mouth 2 (two) times daily.        . insulin glargine (LANTUS SOLOSTAR) 100 UNIT/ML injection Inject 70 Units into the skin daily.        . Insulin Syringe-Needle U-100 (BD INSULIN SYRINGE ULTRAFINE) 31G X 5/16" 1 ML MISC Use as directed for blood sugar       . losartan-hydrochlorothiazide (HYZAAR) 100-25 MG per tablet Take 1 tablet by mouth daily.        . metFORMIN (GLUCOPHAGE) 1000 MG tablet Take 1,000 mg by mouth 2 (two) times daily.        . metoprolol (TOPROL-XL) 200 MG 24 hr tablet Take 200 mg by mouth daily.        . potassium chloride (KLOR-CON) 20 MEQ packet Take 20 mEq by mouth 2 (two) times daily.        . pravastatin (PRAVACHOL) 40 MG tablet Take 40 mg by mouth daily.        [name] warfarin (COUMADIN) 10 MG tablet Take by  mouth as directed.         Review of Systems Review of Systems  Constitutional: Negative for diaphoresis, activity change, appetite change and unexpected weight change.  HENT: Negative for hearing loss, ear pain, facial swelling, mouth sores and neck stiffness.   Eyes: Negative for pain, redness and visual disturbance.  Respiratory: Negative for shortness of breath and wheezing.   Cardiovascular: Negative for chest pain and palpitations.  Gastrointestinal: Negative for diarrhea, blood in stool, abdominal distention and rectal pain.  Genitourinary: Negative for hematuria, flank pain and decreased urine volume.  Musculoskeletal: Negative for myalgias and joint swelling.  Skin: Negative for color change and wound.  Neurological: Negative for syncope and  numbness.  Hematological: Negative for adenopathy.  Psychiatric/Behavioral: Negative for hallucinations, self-injury, decreased concentration and agitation.      Objective:   Physical Exam BP 92/60  Pulse 66  Temp(Src) 97.9 F (36.6 C) (Oral)  Ht 6\' 2"  (1.88 m)  Wt 290 lb 4 oz (131.657 kg)  BMI 37.27 kg/m2  SpO2 96% Physical Exam  VS noted Constitutional: Pt is oriented to person, place, and time. Appears well-developed and well-nourished.  HENT:  Head: Normocephalic and atraumatic.  Right Ear: External ear normal.  Left Ear: External ear normal.  Nose: Nose normal.  Mouth/Throat: Oropharynx is clear and moist.  Eyes: Conjunctivae and EOM are normal. Pupils are equal, round, and reactive to light.  Neck: Normal range of motion. Neck supple. No JVD present. No tracheal deviation present.  Cardiovascular: Normal rate, regular rhythm, normal heart sounds and intact distal pulses.   Pulmonary/Chest: Effort normal and breath sounds normal.  Abdominal: Soft. Bowel sounds are normal. There is no tenderness.  Musculoskeletal: Normal range of motion. Exhibits no edema.  Lymphadenopathy:  Has no cervical adenopathy.  Neurological: Pt is alert and oriented to person, place, and time. Pt has normal reflexes. No cranial nerve deficit.  Skin: Skin is warm and dry. No rash noted.  Psychiatric:  Has  normal mood and affect. Behavior is normal.         Assessment & Plan:

## 2010-10-12 NOTE — Patient Instructions (Signed)
Continue all other medications as before Please go to LAB in the Basement for the blood and/or urine tests to be done today Please call the phone number [phone] (the PhoneTree System) for results of testing in 2-3 days;  When calling, simply dial the number, and when prompted enter the MRN number above (the Medical Record Number) and the # key, then the message should start. Please return in 6 mo with Lab testing done 3-5 days before  

## 2010-10-12 NOTE — Assessment & Plan Note (Signed)

## 2010-10-13 ENCOUNTER — Telehealth: Payer: Self-pay | Admitting: Internal Medicine

## 2010-10-13 DIAGNOSIS — IMO0001 Reserved for inherently not codable concepts without codable children: Secondary | ICD-10-CM

## 2010-10-13 NOTE — Telephone Encounter (Signed)
Message copied by [name] on Wed Oct 13, 2010  5:08 PM ------      Message from: Scharlene Gloss      Created: Wed Oct 13, 2010  3:35 PM       Patient returned call, informed of lab results. The patient has been taking his DM medication. He agreed to a referral to Dr. Everardo All.

## 2010-10-13 NOTE — Telephone Encounter (Signed)
OK to refer to Dr Everardo All - I will do

## 2010-10-20 ENCOUNTER — Ambulatory Visit (INDEPENDENT_AMBULATORY_CARE_PROVIDER_SITE_OTHER): Payer: Medicare Other | Admitting: Internal Medicine

## 2010-10-20 ENCOUNTER — Encounter: Payer: Self-pay | Admitting: Internal Medicine

## 2010-10-20 ENCOUNTER — Ambulatory Visit (INDEPENDENT_AMBULATORY_CARE_PROVIDER_SITE_OTHER): Payer: Medicare Other | Admitting: *Deleted

## 2010-10-20 VITALS — BP 110/75 | HR 63 | Resp 18 | Ht 74.0 in | Wt 296.0 lb

## 2010-10-20 DIAGNOSIS — I10 Essential (primary) hypertension: Secondary | ICD-10-CM

## 2010-10-20 DIAGNOSIS — G459 Transient cerebral ischemic attack, unspecified: Secondary | ICD-10-CM

## 2010-10-20 DIAGNOSIS — I359 Nonrheumatic aortic valve disorder, unspecified: Secondary | ICD-10-CM

## 2010-10-20 DIAGNOSIS — Z8679 Personal history of other diseases of the circulatory system: Secondary | ICD-10-CM

## 2010-10-20 DIAGNOSIS — I4891 Unspecified atrial fibrillation: Secondary | ICD-10-CM

## 2010-10-20 LAB — POCT INR: INR: 2.5

## 2010-10-20 MED ORDER — CLONIDINE HCL 0.1 MG PO TABS: 0.1000 mg | ORAL_TABLET | Freq: Two times a day (BID) | ORAL | Status: DC

## 2010-10-20 NOTE — Patient Instructions (Signed)
Decrease Clonidine to 0.1 mg Twice daily  Your physician has requested that you have an echocardiogram. Echocardiography is a painless test that uses sound waves to create images of your heart. It provides your doctor with information about the size and shape of your heart and how well your heart's chambers and valves are working. This procedure takes approximately one hour. There are no restrictions for this procedure.  NEEDS IN 1 YEAR. Your physician wants you to follow-up in: 1 year.  You will receive a reminder letter in the mail two months in advance. If you don't receive a letter, please call our office to schedule the follow-up appointment.

## 2010-10-20 NOTE — Assessment & Plan Note (Signed)
BP on low end in setting of significant weight loss. Will drop clonidine to 0.1 bid. If SBP goes back up to 140. Can increase dose back.

## 2010-10-20 NOTE — Progress Notes (Signed)
HPI:  [name] is a very pleasant 45 year old male with a history of spontaneous ascending aortic dissection with aortic valve involvement status post mechanical AVR with ascending aorta conduit in 2000.  He also has a history of morbid obesity, hypertension, diabetes, and hyperlipidemia.    Recently admitted for atypical CP. Enzymes normal.   Had Myoview in 2011. Walked for 9:00 on [place]. No CP or ECG changes. Images with EF 39% Prominent inferior scar with very mild reversibility. F/u echo 2/12 EF 55-60%. AVR working well.  Returns today for f/u. Doing well. No further CP. Exercising vigorously at gym without problem. Has lost 35-40 pounds. Tolerating coumadin without incident. INR 2.5. BP ranges 95-115. Denies dizziness.    ROS: All systems negative except as listed in HPI, PMH and Problem List.  Past Medical History  Diagnosis Date  . Diabetes mellitus   . Hypertension   . DIABETES MELLITUS, TYPE II 05/21/2007  . Unspecified vitamin D deficiency 08/07/2008  . HYPERLIPIDEMIA 08/20/2007  . HYPERTENSION 05/21/2007  . CAD 05/21/2007  . Aortic valve disorders 09/30/2009  . TIA 05/21/2007  . Gross hematuria 08/07/2008  . FATIGUE 12/26/2007  . RASH-NONVESICULAR 08/07/2008  . CHEST PAIN-UNSPECIFIED 01/13/2010  . TRANSIENT ISCHEMIC ATTACK, HX OF 12/26/2007  . NEPHROLITHIASIS, HX OF 05/21/2007  . Atrial fibrillation 07/27/2010    Current Outpatient Prescriptions  Medication Sig Dispense Refill  . amLODipine (NORVASC) 10 MG tablet Take 10 mg by mouth daily.        [name] aspirin 81 MG EC tablet Take 81 mg by mouth daily.        . Cholecalciferol (VITAMIN D3) 1000 UNITS CAPS Take by mouth daily.        . cloNIDine (CATAPRES) 0.2 MG tablet take 1 tablet by mouth twice a day  60 tablet  5  . furosemide (LASIX) 40 MG tablet Take 40 mg by mouth 2 (two) times daily.        . insulin glargine (LANTUS SOLOSTAR) 100 UNIT/ML injection Inject 70 Units into the skin daily.        . Insulin Syringe-Needle U-100 (BD  INSULIN SYRINGE ULTRAFINE) 31G X 5/16" 1 ML MISC Use as directed for blood sugar       . losartan-hydrochlorothiazide (HYZAAR) 100-25 MG per tablet Take 1 tablet by mouth daily.        . metFORMIN (GLUCOPHAGE) 1000 MG tablet Take 1,000 mg by mouth 2 (two) times daily.        . metoprolol (TOPROL-XL) 200 MG 24 hr tablet Take 200 mg by mouth daily.        . potassium chloride (KLOR-CON) 20 MEQ packet Take 20 mEq by mouth 2 (two) times daily.        . pravastatin (PRAVACHOL) 40 MG tablet Take 40 mg by mouth daily.        [name] warfarin (COUMADIN) 10 MG tablet Take by mouth as directed.           PHYSICAL EXAM: Filed Vitals:   10/20/10 0907  BP: 110/75  Pulse: 63  Resp: 18   General:  Normal. well appearing HEENT:  Sclerae are anicteric.  EOMI.  Mucous membranes are moist. NECK:  The neck is supple, no JVD.  Carotids are 2+ bilaterally with bilateral radiated bruits off his aortic valve.  No lymphadenopathy or thyromegaly. CARDIAC:  He has a regular rate and rhythm.  There is a 3/6 systolic murmur across the aortic valve.  There is a mechanical click, which is  crisp. No AI.  No rub or gallop. LUNGS:  Clear. ABDOMEN:  Obese, nontender, nondistended.  No apparent hepatosplenomegaly.  No bruits, no masses. EXTREMITIES:  Warm with no cyanosis, clubbing or edema, good distal pulses.   NEUROLOGIC:  Alert and oriented times three.  Cranial nerves II through XII are intact.  Moves all four extremities without difficulty.    ECG:  NSR 62 LVH . Non-specific ST-T wave abnormalities.     ASSESSMENT & PLAN:

## 2010-10-20 NOTE — Assessment & Plan Note (Signed)
Doing well. AVR stable. F/u echo in 1 year.

## 2010-10-21 ENCOUNTER — Other Ambulatory Visit: Payer: Self-pay | Admitting: Internal Medicine

## 2010-10-25 ENCOUNTER — Other Ambulatory Visit: Payer: Self-pay | Admitting: Internal Medicine

## 2010-10-25 NOTE — Telephone Encounter (Signed)
Rx refill denied patient not under prescriber care, sees Dr [name]

## 2010-10-27 ENCOUNTER — Encounter: Payer: Self-pay | Admitting: Endocrinology

## 2010-10-27 ENCOUNTER — Ambulatory Visit (INDEPENDENT_AMBULATORY_CARE_PROVIDER_SITE_OTHER): Payer: Medicare Other | Admitting: Endocrinology

## 2010-10-27 ENCOUNTER — Other Ambulatory Visit: Payer: Medicare Other

## 2010-10-27 VITALS — BP 122/84 | HR 68 | Temp 97.9°F | Ht 75.0 in | Wt 302.8 lb

## 2010-10-27 DIAGNOSIS — E119 Type 2 diabetes mellitus without complications: Secondary | ICD-10-CM

## 2010-10-27 NOTE — Progress Notes (Signed)
Subjective:    Patient ID: [name], male    DOB: [birth year], 45 y.o.   MRN: [id number]  HPI pt states 8 years h/o dm.  he has several chronic complications.  he has been on insulin intermittently x a few years.  Most recently, he has been on it x a few mos.  no cbg record, but states cbg's vary from 98-200.  It is in general higher as the day goes on.   pt says his diet and exercise are very good.  Symptomatically, he reports few years of slight numbness of the feet, in the context of sitting, and assoc weight gain.   Past Medical History  Diagnosis Date  . Diabetes mellitus   . Hypertension   . DIABETES MELLITUS, TYPE II 05/21/2007  . Unspecified vitamin D deficiency 08/07/2008  . HYPERLIPIDEMIA 08/20/2007  . HYPERTENSION 05/21/2007  . CAD 05/21/2007  . Aortic valve disorders 09/30/2009  . TIA 05/21/2007  . Gross hematuria 08/07/2008  . FATIGUE 12/26/2007  . RASH-NONVESICULAR 08/07/2008  . CHEST PAIN-UNSPECIFIED 01/13/2010  . TRANSIENT ISCHEMIC ATTACK, HX OF 12/26/2007  . NEPHROLITHIASIS, HX OF 05/21/2007  . Atrial fibrillation 07/27/2010    Past Surgical History  Procedure Date  . Appendectomy 1998  . Coronary artery bypass graft 2000    History   Social History  . Marital Status: Legally Separated    Spouse Name: N/A    Number of Children: 4  . Years of Education: N/A   Occupational History  . DISABLED    Social History Main Topics  . Smoking status: Never Smoker   . Smokeless tobacco: Not on file  . Alcohol Use: No  . Drug Use: No  . Sexually Active: Not on file   Other Topics Concern  . Not on file   Social History Narrative  . No narrative on file    Current Outpatient Prescriptions on File Prior to Visit  Medication Sig Dispense Refill  . amLODipine (NORVASC) 10 MG tablet Take 10 mg by mouth daily.        [name] aspirin 81 MG EC tablet Take 81 mg by mouth daily.        . Cholecalciferol (VITAMIN D3) 1000 UNITS CAPS Take by mouth daily.        . cloNIDine  (CATAPRES) 0.1 MG tablet Take 1 tablet (0.1 mg total) by mouth 2 (two) times daily.  60 tablet  12  . furosemide (LASIX) 40 MG tablet take 1 tablet by mouth once daily  30 tablet  11  . insulin glargine (LANTUS SOLOSTAR) 100 UNIT/ML injection Inject 70 Units into the skin daily.        . Insulin Syringe-Needle U-100 (BD INSULIN SYRINGE ULTRAFINE) 31G X 5/16" 1 ML MISC Use as directed for blood sugar       . losartan (COZAAR) 100 MG tablet take 1 tablet by mouth once daily  30 tablet  11  . losartan-hydrochlorothiazide (HYZAAR) 100-25 MG per tablet Take 1 tablet by mouth daily.        . metFORMIN (GLUCOPHAGE) 1000 MG tablet Take 1,000 mg by mouth 2 (two) times daily.        . metoprolol (TOPROL-XL) 200 MG 24 hr tablet Take 200 mg by mouth daily.        . potassium chloride (KLOR-CON) 20 MEQ packet Take 20 mEq by mouth 2 (two) times daily.        . pravastatin (PRAVACHOL) 40 MG tablet Take 40 mg by mouth  daily.        . warfarin (COUMADIN) 10 MG tablet TAKE AS DIRECTED BY CLINIC  35 tablet  3    No Known Allergies  Family History  Problem Relation Age of Onset  . Diabetes Mother   . Hypertension Mother   . Diabetes Brother   . Coronary artery disease Other 68    male 1st degree relative, CABG in his 24's (father)    BP 122/84  Pulse 68  Temp(Src) 97.9 F (36.6 C) (Oral)  Ht 6\' 3"  (1.905 m)  Wt 302 lb 12.8 oz (137.349 kg)  BMI 37.85 kg/m2  SpO2 98%     Review of Systems denies fever, blurry vision, headache, chest pain, sob, n/v, urinary frequency, cramps, excessive diaphoresis, memory loss, depression, hypoglycemia, and rhinorrhea. He has mildly easy bruising.     Objective:   Physical Exam VS: see vs page GEN: no distress HEAD: head: no deformity eyes: no periorbital swelling, no proptosis external nose and ears are normal mouth: no lesion seen NECK: supple, thyroid is not enlarged CHEST WALL: no deformity BREASTS:  No gynecomastia CV: reg rate and rhythm, there is a  systolic murmur, and a valve click.  ABD: abdomen is soft, nontender.  no hepatosplenomegaly.  not distended.  no hernia MUSCULOSKELETAL: muscle bulk and strength are grossly normal.  no obvious joint swelling.  gait is normal and steady EXTEMITIES: no deformity.  no ulcer on the feet.  feet are of normal color and temp.  1+ bilat leg edema PULSES: dorsalis pedis intact bilat.  no carotid bruit NEURO:  cn 2-12 grossly intact.   readily moves all 4's.  sensation is intact to touch on the feet SKIN:  Normal texture and temperature.  No rash or suspicious lesion is visible.   NODES:  None palpable at the neck PSYCH: alert, oriented x3.  Does not appear anxious nor depressed.     Lab Results  Component Value Date   HGBA1C 13.3* 10/12/2010      Assessment & Plan:  Type 2 dm, with poor control. Aortic valve disease, with h/o avr, caused by or contributed to by dm. Tia, caused by or contributed to by dm

## 2010-10-27 NOTE — Patient Instructions (Addendum)
good diet and exercise habits significanly improve the control of your diabetes.  please let me know if you wish to be referred to a dietician.  high blood sugar is very risky to your health.  you should see an eye doctor every year. controlling your blood pressure and cholesterol drastically reduces the damage diabetes does to your body.  this also applies to quitting smoking.  please discuss these with your doctor.   check your blood sugar 2 times a day.  vary the time of day when you check, between before the 3 meals, and at bedtime.  also check if you have symptoms of your blood sugar being too high or too low.  please keep a record of the readings and bring it to your next appointment here.  please call us sooner if you are having low blood sugar episodes. For now, stop metformin. blood tests are being ordered for you today.  please call [phone] to hear your test results.  You will be prompted to enter the 9-digit "MRN" number that appears at the top left of this page, followed by #.  Then you will hear the message. we will need to take this complex situation in stages. Please make a follow-up appointment in 2-3 weeks.

## 2010-11-01 ENCOUNTER — Other Ambulatory Visit: Payer: Self-pay | Admitting: Internal Medicine

## 2010-11-10 ENCOUNTER — Encounter: Payer: Self-pay | Admitting: Endocrinology

## 2010-11-10 ENCOUNTER — Ambulatory Visit (INDEPENDENT_AMBULATORY_CARE_PROVIDER_SITE_OTHER): Payer: Medicare Other | Admitting: Endocrinology

## 2010-11-10 DIAGNOSIS — E119 Type 2 diabetes mellitus without complications: Secondary | ICD-10-CM

## 2010-11-10 MED ORDER — INSULIN ASPART 100 UNIT/ML ~~LOC~~ SOLN: 10.0000 [IU] | Freq: Every day | SUBCUTANEOUS | Status: DC

## 2010-11-10 NOTE — Progress Notes (Signed)
Subjective:    Patient ID: [name], male    DOB: Oct 20, 1965, 45 y.o.   MRN: [id number]  HPI pt states he feels well in general.  he brings a record of his cbg's which i have reviewed today.  qac (but not at hs), it varies from 90-120.  He did 1 pc cbg (245).   Past Medical History  Diagnosis Date  . Diabetes mellitus   . Hypertension   . DIABETES MELLITUS, TYPE II 05/21/2007  . Unspecified vitamin D deficiency 08/07/2008  . HYPERLIPIDEMIA 08/20/2007  . HYPERTENSION 05/21/2007  . CAD 05/21/2007  . Aortic valve disorders 09/30/2009  . TIA 05/21/2007  . Gross hematuria 08/07/2008  . FATIGUE 12/26/2007  . RASH-NONVESICULAR 08/07/2008  . CHEST PAIN-UNSPECIFIED 01/13/2010  . TRANSIENT ISCHEMIC ATTACK, HX OF 12/26/2007  . NEPHROLITHIASIS, HX OF 05/21/2007  . Atrial fibrillation 07/27/2010    Past Surgical History  Procedure Date  . Appendectomy 1998  . Coronary artery bypass graft 2000    History   Social History  . Marital Status: Legally Separated    Spouse Name: N/A    Number of Children: 4  . Years of Education: N/A   Occupational History  . DISABLED    Social History Main Topics  . Smoking status: Never Smoker   . Smokeless tobacco: Not on file  . Alcohol Use: No  . Drug Use: No  . Sexually Active: Not on file   Other Topics Concern  . Not on file   Social History Narrative  . No narrative on file    Current Outpatient Prescriptions on File Prior to Visit  Medication Sig Dispense Refill  . amLODipine (NORVASC) 10 MG tablet Take 10 mg by mouth daily.        [name] aspirin 81 MG EC tablet Take 81 mg by mouth daily.        . Cholecalciferol (VITAMIN D3) 1000 UNITS CAPS Take by mouth daily.        . cloNIDine (CATAPRES) 0.1 MG tablet Take 1 tablet (0.1 mg total) by mouth 2 (two) times daily.  60 tablet  12  . furosemide (LASIX) 40 MG tablet take 1 tablet by mouth once daily  30 tablet  11  . Insulin Syringe-Needle U-100 (BD INSULIN SYRINGE ULTRAFINE) 31G X 5/16" 1 ML  MISC Use as directed for blood sugar       . losartan (COZAAR) 100 MG tablet take 1 tablet by mouth once daily  30 tablet  11  . losartan-hydrochlorothiazide (HYZAAR) 100-25 MG per tablet Take 1 tablet by mouth daily.        . metoprolol (TOPROL-XL) 200 MG 24 hr tablet Take 200 mg by mouth daily.        . potassium chloride (KLOR-CON) 20 MEQ packet Take 20 mEq by mouth 2 (two) times daily.        . pravastatin (PRAVACHOL) 40 MG tablet Take 40 mg by mouth daily.        [name] warfarin (COUMADIN) 10 MG tablet TAKE AS DIRECTED BY CLINIC  35 tablet  3  . DISCONTD: LANTUS 100 UNIT/ML injection INJECT 55 UNITS SUBCUTANEOUSLY ONCE DAILY  20 mL  11    No Known Allergies  Family History  Problem Relation Age of Onset  . Diabetes Mother   . Hypertension Mother   . Diabetes Brother   . Coronary artery disease Other 23    male 1st degree relative, CABG in his 28's (father)    BP  118/82  Pulse 68  Temp(Src) 98 F (36.7 C) (Oral)  Ht 6\' 2"  (1.88 m)  Wt 296 lb (134.265 kg)  BMI 38.00 kg/m2  SpO2 97%    Review of Systems denies hypoglycemia    Objective:   Physical Exam SKIN: Insulin injection sites at the anterior abdomen are normal.    Fructosamine= 394 (converts to a1c of 8.5)    Assessment & Plan:  Type 2 dm.  It will take fast-acting analog in order to achieve a good a1c.

## 2010-11-10 NOTE — Patient Instructions (Addendum)
check your blood sugar 2 times a day.  vary the time of day when you check, between before the 3 meals, and at bedtime.  also check if you have symptoms of your blood sugar being too high or too low.  please keep a record of the readings and bring it to your next appointment here.  please call us sooner if you are having low blood sugar episodes. we will need to take this complex situation in stages. For now, take lantus 60 units daily.  Also, start novolog 10 units with the evening meal.   Please make a follow-up appointment in 2 weeks.

## 2010-11-17 ENCOUNTER — Ambulatory Visit (INDEPENDENT_AMBULATORY_CARE_PROVIDER_SITE_OTHER): Payer: Medicare Other | Admitting: *Deleted

## 2010-11-17 DIAGNOSIS — I359 Nonrheumatic aortic valve disorder, unspecified: Secondary | ICD-10-CM

## 2010-11-17 DIAGNOSIS — G459 Transient cerebral ischemic attack, unspecified: Secondary | ICD-10-CM

## 2010-11-17 DIAGNOSIS — Z8679 Personal history of other diseases of the circulatory system: Secondary | ICD-10-CM

## 2010-11-17 DIAGNOSIS — I4891 Unspecified atrial fibrillation: Secondary | ICD-10-CM

## 2010-11-25 ENCOUNTER — Ambulatory Visit: Payer: Medicare Other | Admitting: Internal Medicine

## 2010-11-26 ENCOUNTER — Ambulatory Visit (INDEPENDENT_AMBULATORY_CARE_PROVIDER_SITE_OTHER): Payer: Medicare Other | Admitting: Endocrinology

## 2010-11-26 ENCOUNTER — Encounter: Payer: Self-pay | Admitting: Endocrinology

## 2010-11-26 VITALS — BP 120/82 | HR 78 | Temp 98.3°F | Ht 74.0 in | Wt 296.0 lb

## 2010-11-26 DIAGNOSIS — E119 Type 2 diabetes mellitus without complications: Secondary | ICD-10-CM

## 2010-11-26 NOTE — Patient Instructions (Addendum)
check your blood sugar 2 times a day.  vary the time of day when you check, between before the 3 meals, and at bedtime.  also check if you have symptoms of your blood sugar being too high or too low.  please keep a record of the readings and bring it to your next appointment here.  please call us sooner if you are having low blood sugar episodes. For now, reduce lantus to 55 units daily.  Also, increase novolog to 15 units with the evening meal.   Please make a follow-up appointment in 1 month.

## 2010-11-26 NOTE — Progress Notes (Signed)
Subjective:    Patient ID: [name], male    DOB: 08-Mar-1966, 45 y.o.   MRN: [id number]  HPI pt states he feels well in general.  he brings a record of his cbg's which i have reviewed today.  It varies from 60 (am) to 140 (hs).  However, almost all cbg's are approx 100.   Past Medical History  Diagnosis Date  . Diabetes mellitus   . Hypertension   . DIABETES MELLITUS, TYPE II 05/21/2007  . Unspecified vitamin D deficiency 08/07/2008  . HYPERLIPIDEMIA 08/20/2007  . HYPERTENSION 05/21/2007  . CAD 05/21/2007  . Aortic valve disorders 09/30/2009  . TIA 05/21/2007  . Gross hematuria 08/07/2008  . FATIGUE 12/26/2007  . RASH-NONVESICULAR 08/07/2008  . CHEST PAIN-UNSPECIFIED 01/13/2010  . TRANSIENT ISCHEMIC ATTACK, HX OF 12/26/2007  . NEPHROLITHIASIS, HX OF 05/21/2007  . Atrial fibrillation 07/27/2010    Past Surgical History  Procedure Date  . Appendectomy 1998  . Coronary artery bypass graft 2000    History   Social History  . Marital Status: Legally Separated    Spouse Name: N/A    Number of Children: 4  . Years of Education: N/A   Occupational History  . DISABLED    Social History Main Topics  . Smoking status: Never Smoker   . Smokeless tobacco: Not on file  . Alcohol Use: No  . Drug Use: No  . Sexually Active: Not on file   Other Topics Concern  . Not on file   Social History Narrative  . No narrative on file    Current Outpatient Prescriptions on File Prior to Visit  Medication Sig Dispense Refill  . amLODipine (NORVASC) 10 MG tablet Take 10 mg by mouth daily.        [name] aspirin 81 MG EC tablet Take 81 mg by mouth daily.        . Cholecalciferol (VITAMIN D3) 1000 UNITS CAPS Take by mouth daily.        . cloNIDine (CATAPRES) 0.1 MG tablet Take 1 tablet (0.1 mg total) by mouth 2 (two) times daily.  60 tablet  12  . furosemide (LASIX) 40 MG tablet take 1 tablet by mouth once daily  30 tablet  11  . insulin aspart (NOVOLOG FLEXPEN) 100 UNIT/ML injection Inject 10  Units into the skin daily. Take with evening meal, and pen needles daily  10 mL  12  . insulin glargine (LANTUS) 100 UNIT/ML injection 60 Units daily.       . Insulin Syringe-Needle U-100 (BD INSULIN SYRINGE ULTRAFINE) 31G X 5/16" 1 ML MISC Use as directed for blood sugar       . losartan (COZAAR) 100 MG tablet take 1 tablet by mouth once daily  30 tablet  11  . losartan-hydrochlorothiazide (HYZAAR) 100-25 MG per tablet Take 1 tablet by mouth daily.        . metoprolol (TOPROL-XL) 200 MG 24 hr tablet Take 200 mg by mouth daily.        . potassium chloride (KLOR-CON) 20 MEQ packet Take 20 mEq by mouth 2 (two) times daily.        . pravastatin (PRAVACHOL) 40 MG tablet Take 40 mg by mouth daily.        [name] warfarin (COUMADIN) 10 MG tablet TAKE AS DIRECTED BY CLINIC  35 tablet  3    No Known Allergies  Family History  Problem Relation Age of Onset  . Diabetes Mother   . Hypertension Mother   .  Diabetes Brother   . Coronary artery disease Other 86    male 1st degree relative, CABG in his 72's (father)    BP 120/82  Pulse 78  Temp(Src) 98.3 F (36.8 C) (Oral)  Ht 6\' 2"  (1.88 m)  Wt 296 lb (134.265 kg)  BMI 38.00 kg/m2  SpO2 97%  Review of Systems Denies loc    Objective:   Physical Exam GENERAL: no distress. His heart-valve is audible. PSYCH: Alert and oriented x 3.  Does not appear anxious nor depressed.    Assessment & Plan:  Dm.  Improved, but he needs more novolog, and less lantus.

## 2010-12-12 ENCOUNTER — Other Ambulatory Visit: Payer: Self-pay | Admitting: Internal Medicine

## 2010-12-15 ENCOUNTER — Ambulatory Visit (INDEPENDENT_AMBULATORY_CARE_PROVIDER_SITE_OTHER): Payer: Medicare Other | Admitting: *Deleted

## 2010-12-15 DIAGNOSIS — Z8679 Personal history of other diseases of the circulatory system: Secondary | ICD-10-CM

## 2010-12-15 DIAGNOSIS — I359 Nonrheumatic aortic valve disorder, unspecified: Secondary | ICD-10-CM

## 2010-12-15 DIAGNOSIS — I4891 Unspecified atrial fibrillation: Secondary | ICD-10-CM

## 2010-12-15 DIAGNOSIS — G459 Transient cerebral ischemic attack, unspecified: Secondary | ICD-10-CM

## 2010-12-15 LAB — POCT INR: INR: 3.6

## 2010-12-18 ENCOUNTER — Emergency Department (HOSPITAL_COMMUNITY)
Admission: EM | Admit: 2010-12-18 | Discharge: 2010-12-18 | Disposition: A | Discharge status: Home / Self Care | Payer: Medicare Other | Attending: Emergency Medicine | Admitting: Emergency Medicine

## 2010-12-18 ENCOUNTER — Emergency Department (HOSPITAL_COMMUNITY): Payer: Medicare Other

## 2010-12-18 DIAGNOSIS — X58XXXA Exposure to other specified factors, initial encounter: Secondary | ICD-10-CM | POA: Insufficient documentation

## 2010-12-18 DIAGNOSIS — IMO0002 Reserved for concepts with insufficient information to code with codable children: Secondary | ICD-10-CM | POA: Insufficient documentation

## 2010-12-18 DIAGNOSIS — E119 Type 2 diabetes mellitus without complications: Secondary | ICD-10-CM | POA: Insufficient documentation

## 2010-12-18 DIAGNOSIS — Z954 Presence of other heart-valve replacement: Secondary | ICD-10-CM | POA: Insufficient documentation

## 2010-12-18 DIAGNOSIS — I10 Essential (primary) hypertension: Secondary | ICD-10-CM | POA: Insufficient documentation

## 2010-12-18 DIAGNOSIS — Z794 Long term (current) use of insulin: Secondary | ICD-10-CM | POA: Insufficient documentation

## 2010-12-18 IMAGING — CR DG FINGER MIDDLE 2+V*R*
3 series · 3 of 3 positions shown · non-contrast
Comparison: None

CLINICAL DATA: Pain and swelling. Remote injury 5 months ago.

RIGHT MIDDLE FINGER 2+V

[x finger pa right]
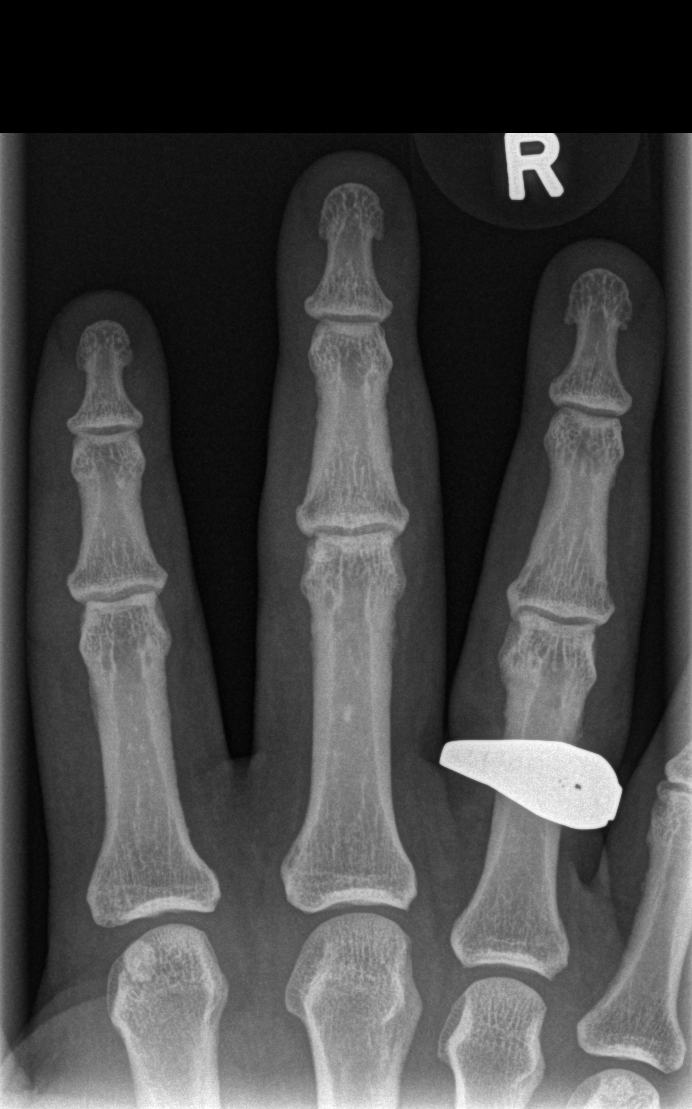

[x finger obl. right]
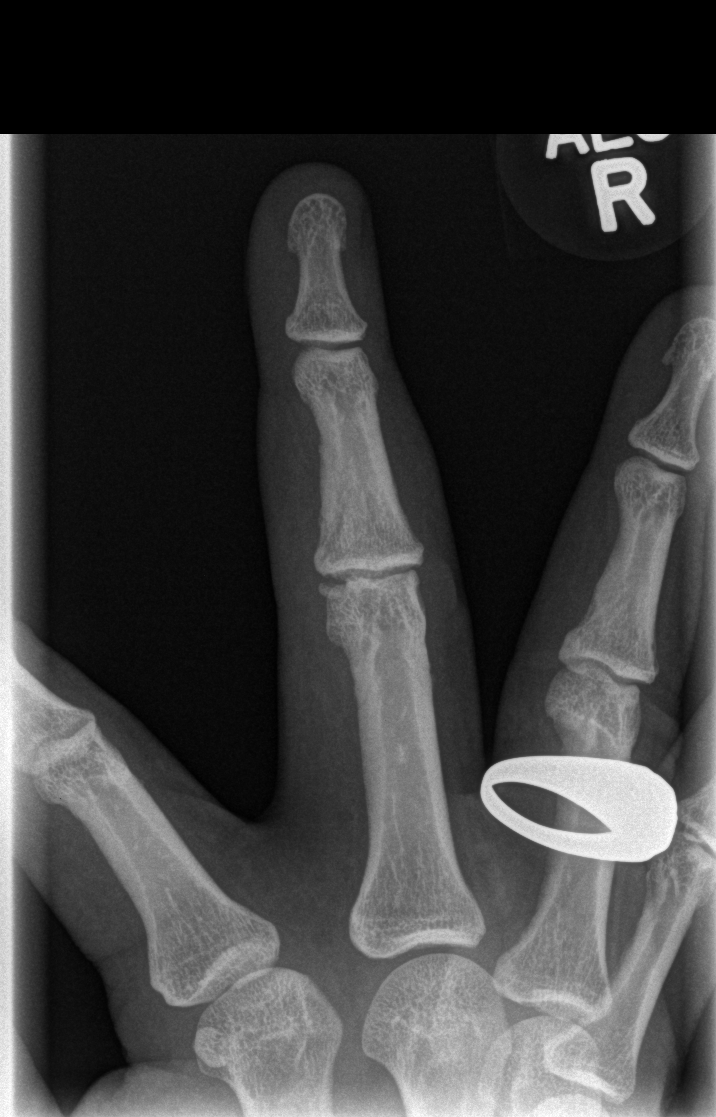

[x finger lateral right]
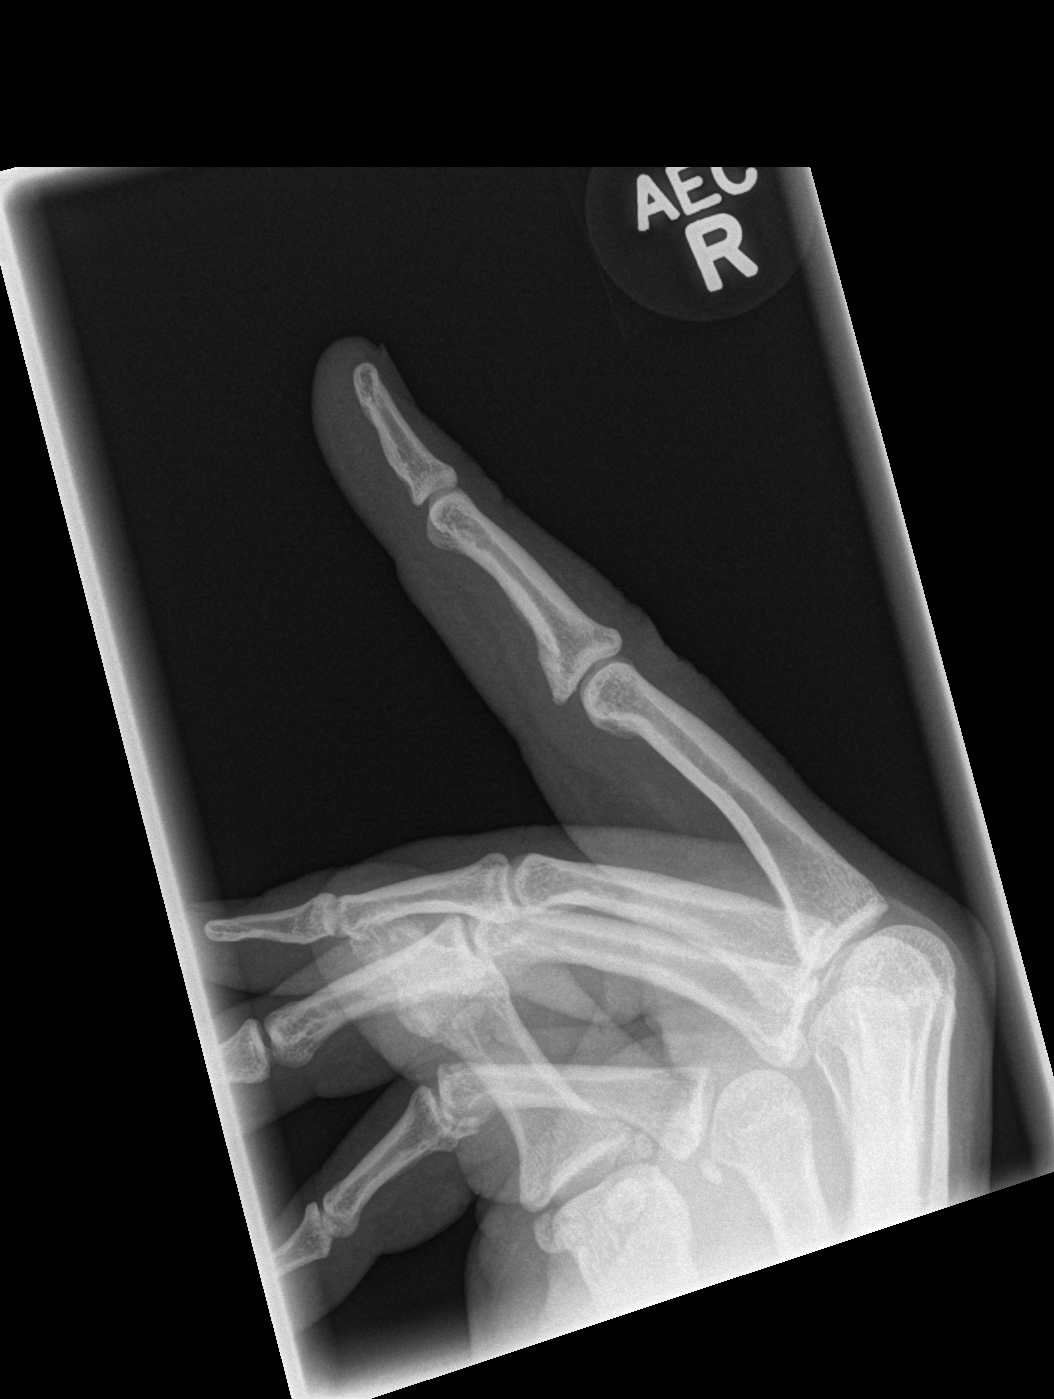

[3 of 3 positions shown; findings below may reference images not displayed]

FINDINGS: There is mild irregularity of the proximal
interphalangeal joint and a probable remote avulsion fracture along
the radial aspect of the proximal phalanx.  This could be
associated with a radial collateral ligament injury. No acute
fracture.
IMPRESSION: Probable remote trauma at the PIP joint with a small ununited
avulsion fracture along the radial aspect.

## 2010-12-20 ENCOUNTER — Other Ambulatory Visit: Payer: Self-pay | Admitting: Internal Medicine

## 2010-12-27 ENCOUNTER — Encounter: Payer: Self-pay | Admitting: Endocrinology

## 2010-12-27 ENCOUNTER — Other Ambulatory Visit (INDEPENDENT_AMBULATORY_CARE_PROVIDER_SITE_OTHER): Payer: Medicare Other

## 2010-12-27 ENCOUNTER — Ambulatory Visit (INDEPENDENT_AMBULATORY_CARE_PROVIDER_SITE_OTHER): Payer: Medicare Other | Admitting: Endocrinology

## 2010-12-27 VITALS — BP 124/88 | HR 76 | Temp 98.3°F | Ht 74.0 in | Wt 306.1 lb

## 2010-12-27 DIAGNOSIS — E119 Type 2 diabetes mellitus without complications: Secondary | ICD-10-CM

## 2010-12-27 DIAGNOSIS — Z79899 Other long term (current) drug therapy: Secondary | ICD-10-CM

## 2010-12-27 NOTE — Progress Notes (Signed)
Subjective:    Patient ID: [name], male    DOB: 03-07-1966, 45 y.o.   MRN: [id number]  HPI Pt states 2 weeks of slight swelling of the hands and feet, but no assoc sob.  He thinks this was caused by lantus, so he stopped it.  He says the edema is improved.  he brings a record of his cbg's which i have reviewed today.  It varies from 92-140.  It is in general higher later in the day.   Past Medical History  Diagnosis Date  . Diabetes mellitus   . Hypertension   . DIABETES MELLITUS, TYPE II 05/21/2007  . Unspecified vitamin D deficiency 08/07/2008  . HYPERLIPIDEMIA 08/20/2007  . HYPERTENSION 05/21/2007  . CAD 05/21/2007  . Aortic valve disorders 09/30/2009  . TIA 05/21/2007  . Gross hematuria 08/07/2008  . FATIGUE 12/26/2007  . RASH-NONVESICULAR 08/07/2008  . CHEST PAIN-UNSPECIFIED 01/13/2010  . TRANSIENT ISCHEMIC ATTACK, HX OF 12/26/2007  . NEPHROLITHIASIS, HX OF 05/21/2007  . Atrial fibrillation 07/27/2010    Past Surgical History  Procedure Date  . Appendectomy 1998  . Coronary artery bypass graft 2000    History   Social History  . Marital Status: Legally Separated    Spouse Name: N/A    Number of Children: 4  . Years of Education: N/A   Occupational History  . DISABLED    Social History Main Topics  . Smoking status: Never Smoker   . Smokeless tobacco: Not on file  . Alcohol Use: No  . Drug Use: No  . Sexually Active: Not on file   Other Topics Concern  . Not on file   Social History Narrative  . No narrative on file    Current Outpatient Prescriptions on File Prior to Visit  Medication Sig Dispense Refill  . amLODipine (NORVASC) 10 MG tablet Take 10 mg by mouth daily.        [name] aspirin 81 MG EC tablet Take 81 mg by mouth daily.        . Cholecalciferol (VITAMIN D3) 1000 UNITS CAPS Take by mouth daily.        . cloNIDine (CATAPRES) 0.1 MG tablet Take 1 tablet (0.1 mg total) by mouth 2 (two) times daily.  60 tablet  12  . furosemide (LASIX) 40 MG tablet  take 1 tablet by mouth once daily  30 tablet  11  . insulin aspart (NOVOLOG) 100 UNIT/ML injection Inject 15 Units into the skin daily. Take with evening meal, and pen needles daily       . insulin glargine (LANTUS) 100 UNIT/ML injection 55 Units daily.       . Insulin Syringe-Needle U-100 (BD INSULIN SYRINGE ULTRAFINE) 31G X 5/16" 1 ML MISC Use as directed for blood sugar       . losartan (COZAAR) 100 MG tablet take 1 tablet by mouth once daily  30 tablet  11  . losartan-hydrochlorothiazide (HYZAAR) 100-25 MG per tablet Take 1 tablet by mouth daily.        . metFORMIN (GLUCOPHAGE) 1000 MG tablet take 1 tablet by mouth twice a day with food  60 tablet  10  . metoprolol (TOPROL-XL) 200 MG 24 hr tablet Take 200 mg by mouth daily.        . potassium chloride (KLOR-CON) 20 MEQ packet Take 20 mEq by mouth 2 (two) times daily.        . pravastatin (PRAVACHOL) 40 MG tablet TAKE 1 TABLET BY MOUTH ONCE DAILY  30 tablet  10  . warfarin (COUMADIN) 10 MG tablet TAKE AS DIRECTED BY CLINIC  35 tablet  3    No Known Allergies  Family History  Problem Relation Age of Onset  . Diabetes Mother   . Hypertension Mother   . Diabetes Brother   . Coronary artery disease Other 24    male 1st degree relative, CABG in his 63's (father)    BP 124/88  Pulse 76  Temp(Src) 98.3 F (36.8 C) (Oral)  Ht 6\' 2"  (1.88 m)  Wt 306 lb 1.9 oz (138.855 kg)  BMI 39.30 kg/m2  SpO2 97%  Review of Systems denies hypoglycemia and numbness.      Objective:   Physical Exam Pulses: dorsalis pedis intact bilat.   Feet: no deformity.  no ulcer on the feet.  feet are of normal color and temp.  no edema Neuro: sensation is intact to touch on the feet.  Fructosamine= 275 (converts to a1c of 6.4)    Assessment & Plan:  Dm.  He does not seem to need the lantus now

## 2010-12-27 NOTE — Patient Instructions (Addendum)
check your blood sugar 2 times a day.  vary the time of day when you check, between before the 3 meals, and at bedtime.  also check if you have symptoms of your blood sugar being too high or too low.  please keep a record of the readings and bring it to your next appointment here.  please call us sooner if you are having low blood sugar episodes. blood tests are being ordered for you today.  please call [phone] to hear your test results.  You will be prompted to enter the 9-digit "MRN" number that appears at the top left of this page, followed by #.  Then you will hear the message. pending the test results, please continue novolog 15 units with the evening meal, and metformin.   Please make a follow-up appointment in 1 month. (update: i left message on phone-tree:  rx as we discussed)

## 2010-12-28 LAB — FRUCTOSAMINE: Fructosamine: 275 umol/L (ref <285)

## 2011-01-05 ENCOUNTER — Ambulatory Visit (INDEPENDENT_AMBULATORY_CARE_PROVIDER_SITE_OTHER): Payer: Medicare Other | Admitting: *Deleted

## 2011-01-05 DIAGNOSIS — I4891 Unspecified atrial fibrillation: Secondary | ICD-10-CM

## 2011-01-05 DIAGNOSIS — G459 Transient cerebral ischemic attack, unspecified: Secondary | ICD-10-CM

## 2011-01-05 DIAGNOSIS — I359 Nonrheumatic aortic valve disorder, unspecified: Secondary | ICD-10-CM

## 2011-01-05 DIAGNOSIS — Z8679 Personal history of other diseases of the circulatory system: Secondary | ICD-10-CM

## 2011-01-05 LAB — POCT INR: INR: 3.3

## 2011-01-26 ENCOUNTER — Encounter: Payer: Medicare Other | Admitting: *Deleted

## 2011-01-27 ENCOUNTER — Ambulatory Visit (INDEPENDENT_AMBULATORY_CARE_PROVIDER_SITE_OTHER): Payer: Medicare Other | Admitting: *Deleted

## 2011-01-27 DIAGNOSIS — I4891 Unspecified atrial fibrillation: Secondary | ICD-10-CM

## 2011-01-27 DIAGNOSIS — I359 Nonrheumatic aortic valve disorder, unspecified: Secondary | ICD-10-CM

## 2011-01-27 DIAGNOSIS — Z8679 Personal history of other diseases of the circulatory system: Secondary | ICD-10-CM

## 2011-01-27 DIAGNOSIS — G459 Transient cerebral ischemic attack, unspecified: Secondary | ICD-10-CM

## 2011-01-27 LAB — POCT INR: INR: 2.7

## 2011-02-24 ENCOUNTER — Ambulatory Visit (INDEPENDENT_AMBULATORY_CARE_PROVIDER_SITE_OTHER): Payer: Medicare Other | Admitting: *Deleted

## 2011-02-24 DIAGNOSIS — Z8679 Personal history of other diseases of the circulatory system: Secondary | ICD-10-CM

## 2011-02-24 DIAGNOSIS — G459 Transient cerebral ischemic attack, unspecified: Secondary | ICD-10-CM

## 2011-02-24 DIAGNOSIS — I4891 Unspecified atrial fibrillation: Secondary | ICD-10-CM

## 2011-02-24 DIAGNOSIS — I359 Nonrheumatic aortic valve disorder, unspecified: Secondary | ICD-10-CM

## 2011-03-01 ENCOUNTER — Ambulatory Visit: Payer: Medicare Other | Admitting: Physician Assistant

## 2011-03-04 LAB — BASIC METABOLIC PANEL
BUN: 8
Calcium: 8.9
Chloride: 104
Creatinine, Ser: 1.11
GFR calc Af Amer: >60
GFR calc non Af Amer: >60
GFR calc non Af Amer: >60
Potassium: 3.9
Sodium: 133 — ABNORMAL LOW
Sodium: 135

## 2011-03-04 LAB — CBC
Hemoglobin: 14.5
MCHC: 34.4
MCV: 82.1
Platelets: 153
RBC: 5.11
RBC: 5.53
RDW: 15.1 — ABNORMAL HIGH
WBC: 7.6

## 2011-03-04 LAB — DIFFERENTIAL
Eosinophils Absolute: 0.3
Eosinophils Relative: 4
Lymphocytes Relative: 21
Lymphs Abs: 1.6
Monocytes Absolute: 0.7
Monocytes Relative: 10

## 2011-03-04 LAB — URINALYSIS, ROUTINE W REFLEX MICROSCOPIC
Glucose, UA: >1000 — AB
Leukocytes, UA: NEGATIVE
Specific Gravity, Urine: 1.037 — ABNORMAL HIGH
pH: 5.5

## 2011-03-04 LAB — COMPREHENSIVE METABOLIC PANEL
ALT: 30
AST: 31
Albumin: 4.3
CO2: 27
Calcium: 9.5
Creatinine, Ser: 1.6 — ABNORMAL HIGH
GFR calc Af Amer: 58 — ABNORMAL LOW
GFR calc non Af Amer: 48 — ABNORMAL LOW
Sodium: 129 — ABNORMAL LOW
Total Protein: 8.9 — ABNORMAL HIGH

## 2011-03-04 LAB — PROTIME-INR
INR: 2.4 — ABNORMAL HIGH
INR: 2.6 — ABNORMAL HIGH
Prothrombin Time: 24.5 — ABNORMAL HIGH
Prothrombin Time: 26.4 — ABNORMAL HIGH
Prothrombin Time: 26.6 — ABNORMAL HIGH
Prothrombin Time: 26.6 — ABNORMAL HIGH
Prothrombin Time: 26.7 — ABNORMAL HIGH
Prothrombin Time: 28.5 — ABNORMAL HIGH

## 2011-03-04 LAB — POCT I-STAT 3, ART BLOOD GAS (G3+)
Acid-Base Excess: 1
Operator id: 236041
pH, Arterial: 7.411

## 2011-03-04 LAB — URINE MICROSCOPIC-ADD ON

## 2011-03-04 LAB — GLUCOSE, RANDOM: Glucose, Bld: 328 — ABNORMAL HIGH

## 2011-03-04 LAB — HEMOGLOBIN A1C
Hgb A1c MFr Bld: 11.7 — ABNORMAL HIGH
Mean Plasma Glucose: 339

## 2011-03-07 LAB — COMPREHENSIVE METABOLIC PANEL
Albumin: 3.6
Alkaline Phosphatase: 50
BUN: 14
Creatinine, Ser: 1.12
Glucose, Bld: 166 — ABNORMAL HIGH
Potassium: 3.5
Total Protein: 7.3

## 2011-03-07 LAB — CBC
HCT: 42.1
Hemoglobin: 14.2
MCHC: 33.6
MCV: 82.6
Platelets: 153
RDW: 15.6 — ABNORMAL HIGH

## 2011-03-07 LAB — POCT CARDIAC MARKERS
Myoglobin, poc: 74.1
Operator id: 272551
Troponin i, poc: <0.05

## 2011-03-07 LAB — URINALYSIS, ROUTINE W REFLEX MICROSCOPIC
Bilirubin Urine: NEGATIVE
Glucose, UA: NEGATIVE
Hgb urine dipstick: NEGATIVE
Ketones, ur: NEGATIVE
Protein, ur: NEGATIVE
Urobilinogen, UA: 4 — ABNORMAL HIGH

## 2011-03-07 LAB — B-NATRIURETIC PEPTIDE (CONVERTED LAB): Pro B Natriuretic peptide (BNP): 150 — ABNORMAL HIGH

## 2011-03-07 LAB — DIFFERENTIAL
Basophils Absolute: 0
Basophils Relative: 0
Lymphocytes Relative: 25
Monocytes Relative: 10
Neutro Abs: 3.5
Neutrophils Relative %: 59

## 2011-03-07 LAB — PROTIME-INR: INR: 2.4 — ABNORMAL HIGH

## 2011-03-28 ENCOUNTER — Ambulatory Visit (INDEPENDENT_AMBULATORY_CARE_PROVIDER_SITE_OTHER): Payer: Medicare Other | Admitting: *Deleted

## 2011-03-28 DIAGNOSIS — I359 Nonrheumatic aortic valve disorder, unspecified: Secondary | ICD-10-CM

## 2011-03-28 DIAGNOSIS — G459 Transient cerebral ischemic attack, unspecified: Secondary | ICD-10-CM

## 2011-03-28 DIAGNOSIS — I4891 Unspecified atrial fibrillation: Secondary | ICD-10-CM

## 2011-03-28 DIAGNOSIS — Z8679 Personal history of other diseases of the circulatory system: Secondary | ICD-10-CM

## 2011-03-28 LAB — POCT INR: INR: 2.1

## 2011-04-19 ENCOUNTER — Ambulatory Visit (INDEPENDENT_AMBULATORY_CARE_PROVIDER_SITE_OTHER): Payer: Medicare Other | Admitting: Internal Medicine

## 2011-04-19 ENCOUNTER — Encounter: Payer: Self-pay | Admitting: Internal Medicine

## 2011-04-19 ENCOUNTER — Other Ambulatory Visit (INDEPENDENT_AMBULATORY_CARE_PROVIDER_SITE_OTHER): Payer: Medicare Other

## 2011-04-19 ENCOUNTER — Other Ambulatory Visit: Payer: Self-pay | Admitting: Internal Medicine

## 2011-04-19 VITALS — BP 118/90 | HR 69 | Temp 98.2°F | Ht 74.0 in | Wt 307.0 lb

## 2011-04-19 DIAGNOSIS — E119 Type 2 diabetes mellitus without complications: Secondary | ICD-10-CM

## 2011-04-19 DIAGNOSIS — I10 Essential (primary) hypertension: Secondary | ICD-10-CM

## 2011-04-19 DIAGNOSIS — E785 Hyperlipidemia, unspecified: Secondary | ICD-10-CM

## 2011-04-19 LAB — BASIC METABOLIC PANEL
CO2: 28 mEq/L (ref 19–32)
Calcium: 9.2 mg/dL (ref 8.4–10.5)
GFR: 97.71 mL/min (ref >60.00)
Potassium: 3.7 mEq/L (ref 3.5–5.1)
Sodium: 140 mEq/L (ref 135–145)

## 2011-04-19 LAB — LIPID PANEL
Total CHOL/HDL Ratio: 4
Triglycerides: 245 mg/dL — ABNORMAL HIGH (ref 0.0–149.0)

## 2011-04-19 NOTE — Assessment & Plan Note (Signed)
stable overall by hx and exam, most recent data reviewed with pt, and pt to continue medical treatment as before  BP Readings from Last 3 Encounters:  04/19/11 118/90  12/27/10 124/88  11/26/10 120/82

## 2011-04-19 NOTE — Assessment & Plan Note (Signed)
Unclear control, seems to be asympt with polys, but has gained wt and stopped the humalog, though his a1c was dramatically improved from > 13 to last of 7.1 three months ago.  Continue all other medications as before, .to check labs today,  to f/u any worsening symptoms or concerns

## 2011-04-19 NOTE — Patient Instructions (Signed)
Continue all other medications as before Please go to LAB in the Basement for the blood and/or urine tests to be done today Please call the phone number [phone] (the PhoneTree System) for results of testing in 2-3 days;  When calling, simply dial the number, and when prompted enter the MRN number above (the Medical Record Number) and the # key, then the message should start. Please return in 6 months, or sooner if needed 

## 2011-04-19 NOTE — Progress Notes (Signed)
Subjective:    Patient ID: [name], male    DOB: [birth year], 45 y.o.   MRN: [id number]  HPI Here to f/u; overall doing ok,  Pt denies chest pain, increased sob or doe, wheezing, orthopnea, PND, increased LE swelling, palpitations, dizziness or syncope.  Pt denies new neurological symptoms such as new headache, or facial or extremity weakness or numbness   Pt denies polydipsia, polyuria, or low sugar symptoms such as weakness or confusion improved with po intake.  Pt states overall good compliance with meds, trying to follow lower cholesterol, diabetic diet, wt overall stable but little exercise however (except for 3-4 times per wk at gym) , and has actually gained wt from 290 to current 307, with fatigue and drowsiness during day but no frank somnolence or need to take naps per pt, he blames wt gain on the humalog and has stopped this, and no current f/u appt with endo/dr ellison for now.   Past Medical History  Diagnosis Date  . Diabetes mellitus   . Hypertension   . DIABETES MELLITUS, TYPE II 05/21/2007  . Unspecified vitamin D deficiency 08/07/2008  . HYPERLIPIDEMIA 08/20/2007  . HYPERTENSION 05/21/2007  . CAD 05/21/2007  . Aortic valve disorders 09/30/2009  . TIA 05/21/2007  . Gross hematuria 08/07/2008  . FATIGUE 12/26/2007  . RASH-NONVESICULAR 08/07/2008  . CHEST PAIN-UNSPECIFIED 01/13/2010  . TRANSIENT ISCHEMIC ATTACK, HX OF 12/26/2007  . NEPHROLITHIASIS, HX OF 05/21/2007  . Atrial fibrillation 07/27/2010   Past Surgical History  Procedure Date  . Appendectomy 1998  . Coronary artery bypass graft 2000    reports that he has never smoked. He does not have any smokeless tobacco history on file. He reports that he does not drink alcohol or use illicit drugs. family history includes Coronary artery disease (age of onset:50) in his other; Diabetes in his brother and mother; and Hypertension in his mother. Allergies  Allergen Reactions  . Lantus     edema   Current Outpatient  Prescriptions on File Prior to Visit  Medication Sig Dispense Refill  . amLODipine (NORVASC) 10 MG tablet Take 10 mg by mouth daily.        [name] aspirin 81 MG EC tablet Take 81 mg by mouth daily.        . Cholecalciferol (VITAMIN D3) 1000 UNITS CAPS Take by mouth daily.        . cloNIDine (CATAPRES) 0.1 MG tablet Take 1 tablet (0.1 mg total) by mouth 2 (two) times daily.  60 tablet  12  . furosemide (LASIX) 40 MG tablet take 1 tablet by mouth once daily  30 tablet  11  . insulin aspart (NOVOLOG) 100 UNIT/ML injection Inject 15 Units into the skin daily. Take with evening meal, and pen needles daily       . insulin glargine (LANTUS) 100 UNIT/ML injection 55 Units daily.       . Insulin Syringe-Needle U-100 (BD INSULIN SYRINGE ULTRAFINE) 31G X 5/16" 1 ML MISC Use as directed for blood sugar       . losartan (COZAAR) 100 MG tablet take 1 tablet by mouth once daily  30 tablet  11  . losartan-hydrochlorothiazide (HYZAAR) 100-25 MG per tablet Take 1 tablet by mouth daily.        . metFORMIN (GLUCOPHAGE) 1000 MG tablet take 1 tablet by mouth twice a day with food  60 tablet  10  . metoprolol (TOPROL-XL) 200 MG 24 hr tablet Take 200 mg by mouth daily.        [name]  potassium chloride (KLOR-CON) 20 MEQ packet Take 20 mEq by mouth 2 (two) times daily.        . pravastatin (PRAVACHOL) 40 MG tablet TAKE 1 TABLET BY MOUTH ONCE DAILY  30 tablet  10  . warfarin (COUMADIN) 10 MG tablet TAKE AS DIRECTED BY CLINIC  35 tablet  3   Review of Systems Review of Systems  Constitutional: Negative for diaphoresis and unexpected weight change.  HENT: Negative for drooling and tinnitus.   Eyes: Negative for photophobia and visual disturbance.  Respiratory: Negative for choking and stridor.   Gastrointestinal: Negative for vomiting and blood in stool.  Genitourinary: Negative for hematuria and decreased urine volume.    Objective:   Physical Exam BP 118/90  Pulse 69  Temp(Src) 98.2 F (36.8 C) (Oral)  Ht 6\' 2"  (1.88 m)   Wt 307 lb (139.254 kg)  BMI 39.42 kg/m2  SpO2 96% Physical Exam  VS noted Constitutional: Pt appears well-developed and well-nourished.  HENT: Head: Normocephalic.  Right Ear: External ear normal.  Left Ear: External ear normal.  Eyes: Conjunctivae and EOM are normal. Pupils are equal, round, and reactive to light.  Neck: Normal range of motion. Neck supple.  Cardiovascular: Normal rate and regular rhythm.   Pulmonary/Chest: Effort normal and breath sounds normal.  Abd:  Soft, NT, non-distended, + BS Neurological: Pt is alert. No cranial nerve deficit.  Skin: Skin is warm. No erythema.  Psychiatric: Pt behavior is normal. Thought content normal.     Assessment & Plan:

## 2011-04-19 NOTE — Assessment & Plan Note (Signed)
stable overall by hx and exam, most recent data reviewed with pt, and pt to continue medical treatment as before ble  Lab Results  Component Value Date   [facility]  Value: 51        Total Cholesterol/HDL:CHD Risk Coronary Heart Disease Risk Table                     Men   Women  1/2 Average Risk   3.4   3.3  Average Risk       5.0   4.4  2 X Average Risk   9.6   7.1  3 X Average Risk  23.4   11.0        Use the calculated Patient Ratio above and the CHD Risk Table to determine the patient's CHD Risk.        ATP III CLASSIFICATION (LDL):  <100     mg/dL   Optimal  119-147  mg/dL   Near or Above                    Optimal  130-159  mg/dL   Borderline  829-562  mg/dL   High  >130     mg/dL   Very High 12/27/5782

## 2011-04-25 ENCOUNTER — Encounter: Payer: Medicare Other | Admitting: *Deleted

## 2011-04-28 ENCOUNTER — Ambulatory Visit (INDEPENDENT_AMBULATORY_CARE_PROVIDER_SITE_OTHER): Payer: Medicare Other | Admitting: *Deleted

## 2011-04-28 DIAGNOSIS — I359 Nonrheumatic aortic valve disorder, unspecified: Secondary | ICD-10-CM

## 2011-04-28 DIAGNOSIS — G459 Transient cerebral ischemic attack, unspecified: Secondary | ICD-10-CM

## 2011-04-28 DIAGNOSIS — I4891 Unspecified atrial fibrillation: Secondary | ICD-10-CM

## 2011-04-28 DIAGNOSIS — Z8679 Personal history of other diseases of the circulatory system: Secondary | ICD-10-CM

## 2011-04-28 LAB — POCT INR: INR: 2.5

## 2011-05-07 ENCOUNTER — Other Ambulatory Visit: Payer: Self-pay | Admitting: Internal Medicine

## 2011-06-09 ENCOUNTER — Ambulatory Visit (INDEPENDENT_AMBULATORY_CARE_PROVIDER_SITE_OTHER): Payer: Medicare Other | Admitting: *Deleted

## 2011-06-09 DIAGNOSIS — G459 Transient cerebral ischemic attack, unspecified: Secondary | ICD-10-CM

## 2011-06-09 DIAGNOSIS — Z8679 Personal history of other diseases of the circulatory system: Secondary | ICD-10-CM | POA: Diagnosis not present

## 2011-06-09 DIAGNOSIS — I4891 Unspecified atrial fibrillation: Secondary | ICD-10-CM | POA: Diagnosis not present

## 2011-06-09 DIAGNOSIS — I359 Nonrheumatic aortic valve disorder, unspecified: Secondary | ICD-10-CM | POA: Diagnosis not present

## 2011-06-09 LAB — POCT INR: INR: 2.5

## 2011-07-21 ENCOUNTER — Ambulatory Visit (INDEPENDENT_AMBULATORY_CARE_PROVIDER_SITE_OTHER): Payer: Medicare Other | Admitting: Pharmacist

## 2011-07-21 DIAGNOSIS — Z8679 Personal history of other diseases of the circulatory system: Secondary | ICD-10-CM | POA: Diagnosis not present

## 2011-07-21 DIAGNOSIS — I359 Nonrheumatic aortic valve disorder, unspecified: Secondary | ICD-10-CM

## 2011-07-21 DIAGNOSIS — I4891 Unspecified atrial fibrillation: Secondary | ICD-10-CM | POA: Diagnosis not present

## 2011-07-21 DIAGNOSIS — G459 Transient cerebral ischemic attack, unspecified: Secondary | ICD-10-CM | POA: Diagnosis not present

## 2011-07-26 ENCOUNTER — Ambulatory Visit (HOSPITAL_COMMUNITY): Payer: Medicare Other | Attending: Cardiology

## 2011-07-26 ENCOUNTER — Other Ambulatory Visit (HOSPITAL_COMMUNITY): Payer: Self-pay | Admitting: Radiology

## 2011-07-26 ENCOUNTER — Other Ambulatory Visit: Payer: Self-pay

## 2011-07-26 DIAGNOSIS — I359 Nonrheumatic aortic valve disorder, unspecified: Secondary | ICD-10-CM | POA: Insufficient documentation

## 2011-07-26 DIAGNOSIS — I10 Essential (primary) hypertension: Secondary | ICD-10-CM | POA: Diagnosis not present

## 2011-07-26 DIAGNOSIS — E785 Hyperlipidemia, unspecified: Secondary | ICD-10-CM | POA: Diagnosis not present

## 2011-07-26 DIAGNOSIS — E119 Type 2 diabetes mellitus without complications: Secondary | ICD-10-CM | POA: Insufficient documentation

## 2011-07-28 ENCOUNTER — Telehealth: Payer: Self-pay | Admitting: Internal Medicine

## 2011-07-28 NOTE — Telephone Encounter (Signed)
Spoke with pt, aware of echo results. Follow up appt with dr Jens Som to establish.

## 2011-07-28 NOTE — Telephone Encounter (Signed)
Pt requesting ekg results

## 2011-09-01 ENCOUNTER — Ambulatory Visit (INDEPENDENT_AMBULATORY_CARE_PROVIDER_SITE_OTHER): Payer: Medicare Other | Admitting: *Deleted

## 2011-09-01 DIAGNOSIS — I4891 Unspecified atrial fibrillation: Secondary | ICD-10-CM | POA: Diagnosis not present

## 2011-09-01 DIAGNOSIS — G459 Transient cerebral ischemic attack, unspecified: Secondary | ICD-10-CM

## 2011-09-01 DIAGNOSIS — I359 Nonrheumatic aortic valve disorder, unspecified: Secondary | ICD-10-CM

## 2011-09-01 DIAGNOSIS — Z8679 Personal history of other diseases of the circulatory system: Secondary | ICD-10-CM

## 2011-09-01 LAB — POCT INR: INR: 2.2

## 2011-09-23 ENCOUNTER — Encounter: Payer: Self-pay | Admitting: Cardiology

## 2011-09-23 ENCOUNTER — Ambulatory Visit (INDEPENDENT_AMBULATORY_CARE_PROVIDER_SITE_OTHER): Payer: Medicare Other | Admitting: Cardiology

## 2011-09-23 VITALS — BP 134/89 | HR 77 | Ht 74.0 in | Wt 311.0 lb

## 2011-09-23 DIAGNOSIS — I359 Nonrheumatic aortic valve disorder, unspecified: Secondary | ICD-10-CM | POA: Diagnosis not present

## 2011-09-23 DIAGNOSIS — E785 Hyperlipidemia, unspecified: Secondary | ICD-10-CM | POA: Diagnosis not present

## 2011-09-23 DIAGNOSIS — I4891 Unspecified atrial fibrillation: Secondary | ICD-10-CM

## 2011-09-23 DIAGNOSIS — I10 Essential (primary) hypertension: Secondary | ICD-10-CM

## 2011-09-23 LAB — CBC WITH DIFFERENTIAL/PLATELET
Basophils Relative: 0.5 % (ref 0.0–3.0)
Eosinophils Relative: 5.3 % — ABNORMAL HIGH (ref 0.0–5.0)
MCV: 86.3 fl (ref 78.0–100.0)
Monocytes Relative: 8.7 % (ref 3.0–12.0)
Neutrophils Relative %: 68.7 % (ref 43.0–77.0)
Platelets: 136 10*3/uL — ABNORMAL LOW (ref 150.0–400.0)
RBC: 4.66 Mil/uL (ref 4.22–5.81)
WBC: 6.4 10*3/uL (ref 4.5–10.5)

## 2011-09-23 LAB — BASIC METABOLIC PANEL
BUN: 14 mg/dL (ref 6–23)
Chloride: 103 mEq/L (ref 96–112)
Creatinine, Ser: 1.1 mg/dL (ref 0.4–1.5)
GFR: 97.53 mL/min (ref >60.00)
Potassium: 4 mEq/L (ref 3.5–5.1)

## 2011-09-23 NOTE — Assessment & Plan Note (Signed)
Blood pressure controlled. Continue present medications. Check potassium and renal function. 

## 2011-09-23 NOTE — Progress Notes (Signed)
Addended by: Freddi Starr on: 09/23/2011 09:44 AM   Modules accepted: Orders

## 2011-09-23 NOTE — Patient Instructions (Signed)
Your physician wants you to follow-up in: ONE YEAR You will receive a reminder letter in the mail two months in advance. If you don't receive a letter, please call our office to schedule the follow-up appointment.   Your physician recommends that you HAVE LAB WORK TODAY

## 2011-09-23 NOTE — Assessment & Plan Note (Signed)
Patient carries the diagnosis of atrial fibrillation. Continue Coumadin. Continue beta blocker. Patient presently in sinus rhythm. Check CBC given Coumadin use.

## 2011-09-23 NOTE — Progress Notes (Signed)
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 Had Myoview in 2011. Walked for 9:00 on [place]. No CP or ECG changes. Images with EF 39% Prominent inferior scar with very mild reversibility. Cardiac CT was performed in February of 2012. There was no coronary disease with only mild plaque in the proximal right coronary artery. Echocardiogram in March of 2013 showed normal LV function, a mechanical aortic valve prosthesis with trace aortic insufficiency, moderate left atrial enlargement, moderate right atrial enlargement and mild right ventricular enlargement. There was mild mitral regurgitation. The mean gradient across the aortic valve was 33 mm of mercury. Possible valve mismatch. Note mean gradient in May of 2011 was 26 mm of mercury and in 2007 25 mm of mercury.Since he was last seen, the patient denies any dyspnea on exertion, orthopnea, PND, pedal edema, palpitations, syncope or chest pain.    Current Outpatient Prescriptions  Medication Sig Dispense Refill  . amLODipine (NORVASC) 10 MG tablet TAKE 1 TABLET BY MOUTH ONCE DAILY FOR HIGH BLOOD PRESSURE  30 tablet  11  . aspirin 81 MG EC tablet Take 81 mg by mouth daily.        . Cholecalciferol (VITAMIN D3) 1000 UNITS CAPS Take by mouth daily.        . cloNIDine (CATAPRES) 0.1 MG tablet Take 1 tablet (0.1 mg total) by mouth 2 (two) times daily.  60 tablet  12  . furosemide (LASIX) 40 MG tablet take 1 tablet by mouth once daily  30 tablet  11  . Insulin Syringe-Needle U-100 (BD INSULIN SYRINGE ULTRAFINE) 31G X 5/16" 1 ML MISC Use as directed for blood sugar       . KLOR-CON M20 20 MEQ tablet TAKE 2 TABLETS BY MOUTH ONCE DAILY  60 tablet  11  . losartan (COZAAR) 100 MG tablet take 1 tablet by mouth once daily  30 tablet  11  . metFORMIN (GLUCOPHAGE) 1000 MG tablet take 1 tablet by mouth twice a  day with food  60 tablet  10  . metoprolol (TOPROL-XL) 200 MG 24 hr tablet Take 200 mg by mouth daily.        . potassium chloride (KLOR-CON) 20 MEQ packet Take 20 mEq by mouth 2 (two) times daily.        . pravastatin (PRAVACHOL) 40 MG tablet TAKE 1 TABLET BY MOUTH ONCE DAILY  30 tablet  10  . warfarin (COUMADIN) 10 MG tablet Take 1 tablet (10 mg total) by mouth as directed.  35 tablet  3     Past Medical History  Diagnosis Date  . Diabetes mellitus   . Hypertension   . Unspecified vitamin D deficiency 08/07/2008  . HYPERLIPIDEMIA 08/20/2007  . CAD 05/21/2007  . Aortic valve disorders 09/30/2009  . Gross hematuria 08/07/2008  . TRANSIENT ISCHEMIC ATTACK, HX OF 12/26/2007  . NEPHROLITHIASIS, HX OF 05/21/2007  . Atrial fibrillation 07/27/2010    Past Surgical History  Procedure Date  . Appendectomy 1998  . Coronary artery bypass graft 2000    History   Social History  . Marital Status: Legally Separated    Spouse Name: N/A    Number of Children: 4  . Years of Education: N/A   Occupational History  . DISABLED    Social History Main Topics  . Smoking status: Never Smoker   . Smokeless tobacco: Not on file  .  Alcohol Use: No  . Drug Use: No  . Sexually Active: Not on file   Other Topics Concern  . Not on file   Social History Narrative  . No narrative on file    ROS: no fevers or chills, productive cough, hemoptysis, dysphasia, odynophagia, melena, hematochezia, dysuria, hematuria, rash, seizure activity, orthopnea, PND, pedal edema, claudication. Remaining systems are negative.  Physical Exam: Well-developed well-nourished in no acute distress.  Skin is warm and dry.  HEENT is normal.  Neck is supple.  Chest is clear to auscultation with normal expansion.  Cardiovascular exam is regular rate and rhythm. Crisp mechanical valve sound. 2/6 systolic murmur left sternal border. No diastolic murmur. Abdominal exam nontender or distended. No masses palpated. Extremities  show no edema. neuro grossly intact  ECG sinus rhythm at a rate of 79. Occasional PACs. Prior inferior infarct. Nonspecific ST-T wave changes.

## 2011-09-23 NOTE — Assessment & Plan Note (Signed)
Patient doing well with no symptoms. Mean gradient across his aortic valve mildly elevated but not significantly different compared to previous. Most likely valve mismatch. Plan repeat echocardiogram in one year. Repeat CTA of aorta in one year. Continue SBE prophylaxis. Continue aspirin and Coumadin.

## 2011-09-23 NOTE — Assessment & Plan Note (Signed)
Continue statin. 

## 2011-10-13 ENCOUNTER — Ambulatory Visit (INDEPENDENT_AMBULATORY_CARE_PROVIDER_SITE_OTHER): Payer: Medicare Other | Admitting: *Deleted

## 2011-10-13 DIAGNOSIS — I359 Nonrheumatic aortic valve disorder, unspecified: Secondary | ICD-10-CM | POA: Diagnosis not present

## 2011-10-13 DIAGNOSIS — G459 Transient cerebral ischemic attack, unspecified: Secondary | ICD-10-CM | POA: Diagnosis not present

## 2011-10-13 DIAGNOSIS — Z8679 Personal history of other diseases of the circulatory system: Secondary | ICD-10-CM | POA: Diagnosis not present

## 2011-10-13 DIAGNOSIS — I4891 Unspecified atrial fibrillation: Secondary | ICD-10-CM

## 2011-10-13 LAB — POCT INR: INR: 1.7

## 2011-10-18 ENCOUNTER — Other Ambulatory Visit (INDEPENDENT_AMBULATORY_CARE_PROVIDER_SITE_OTHER): Payer: Medicare Other

## 2011-10-18 ENCOUNTER — Encounter: Payer: Self-pay | Admitting: Internal Medicine

## 2011-10-18 ENCOUNTER — Other Ambulatory Visit: Payer: Self-pay | Admitting: Internal Medicine

## 2011-10-18 ENCOUNTER — Ambulatory Visit (INDEPENDENT_AMBULATORY_CARE_PROVIDER_SITE_OTHER): Payer: Medicare Other | Admitting: Internal Medicine

## 2011-10-18 VITALS — BP 130/90 | HR 70 | Temp 97.4°F | Ht 74.0 in | Wt 306.0 lb

## 2011-10-18 DIAGNOSIS — I4891 Unspecified atrial fibrillation: Secondary | ICD-10-CM

## 2011-10-18 DIAGNOSIS — I359 Nonrheumatic aortic valve disorder, unspecified: Secondary | ICD-10-CM | POA: Diagnosis not present

## 2011-10-18 DIAGNOSIS — Z8679 Personal history of other diseases of the circulatory system: Secondary | ICD-10-CM | POA: Diagnosis not present

## 2011-10-18 DIAGNOSIS — Z0000 Encounter for general adult medical examination without abnormal findings: Secondary | ICD-10-CM | POA: Diagnosis not present

## 2011-10-18 DIAGNOSIS — G459 Transient cerebral ischemic attack, unspecified: Secondary | ICD-10-CM

## 2011-10-18 DIAGNOSIS — IMO0001 Reserved for inherently not codable concepts without codable children: Secondary | ICD-10-CM | POA: Diagnosis not present

## 2011-10-18 DIAGNOSIS — E785 Hyperlipidemia, unspecified: Secondary | ICD-10-CM | POA: Diagnosis not present

## 2011-10-18 DIAGNOSIS — I10 Essential (primary) hypertension: Secondary | ICD-10-CM | POA: Diagnosis not present

## 2011-10-18 DIAGNOSIS — E119 Type 2 diabetes mellitus without complications: Secondary | ICD-10-CM | POA: Diagnosis not present

## 2011-10-18 LAB — LIPID PANEL: HDL: 34.6 mg/dL — ABNORMAL LOW (ref >39.00)

## 2011-10-18 LAB — BASIC METABOLIC PANEL
GFR: 110.78 mL/min (ref >60.00)
Glucose, Bld: 290 mg/dL — ABNORMAL HIGH (ref 70–99)
Potassium: 3.7 mEq/L (ref 3.5–5.1)
Sodium: 138 mEq/L (ref 135–145)

## 2011-10-18 LAB — LDL CHOLESTEROL, DIRECT: Direct LDL: 51.6 mg/dL

## 2011-10-18 MED ORDER — PNEUMOCOCCAL VAC POLYVALENT 25 MCG/0.5ML IJ INJ: 0.5000 mL | INJECTION | Freq: Once | INTRAMUSCULAR | Status: DC

## 2011-10-18 MED ORDER — GLIMEPIRIDE 1 MG PO TABS: 1.0000 mg | ORAL_TABLET | Freq: Every day | ORAL | Status: DC

## 2011-10-18 NOTE — Assessment & Plan Note (Signed)
stable overall by hx and exam, most recent data reviewed with pt, and pt to continue medical treatment as before Lab Results  Component Value Date   HGBA1C 6.9* 04/19/2011

## 2011-10-18 NOTE — Assessment & Plan Note (Signed)
Not charged, but due for pneumovax in light of his DM -

## 2011-10-18 NOTE — Patient Instructions (Signed)
Continue all other medications as before Please have the pharmacy call with any refills you may need.Please go to LAB in the Basement for the blood and/or urine tests to be done today You will be contacted by phone if any changes need to be made immediately.  Otherwise, you will receive a letter about your results with an explanation. Please keep your appointments with your specialists as you have planned - coumadin clinic, and cardiology You had the pneumonia shot today Please return in 6 months, or sooner if needed

## 2011-10-18 NOTE — Assessment & Plan Note (Signed)
stable overall by hx and exam, most recent data reviewed with pt, and pt to continue medical treatment as before Lab Results  Component Value Date   [facility]  Value: 51        Total Cholesterol/HDL:CHD Risk Coronary Heart Disease Risk Table                     Men   Women  1/2 Average Risk   3.4   3.3  Average Risk       5.0   4.4  2 X Average Risk   9.6   7.1  3 X Average Risk  23.4   11.0        Use the calculated Patient Ratio above and the CHD Risk Table to determine the patient's CHD Risk.        ATP III CLASSIFICATION (LDL):  <100     mg/dL   Optimal  324-401  mg/dL   Near or Above                    Optimal  130-159  mg/dL   Borderline  027-253  mg/dL   High  >664     mg/dL   Very High 4/0/3474

## 2011-10-18 NOTE — Assessment & Plan Note (Signed)
stable overall by hx and exam, most recent data reviewed with pt, and pt to continue medical treatment as before BP Readings from Last 3 Encounters:  10/18/11 130/90  09/23/11 134/89  04/19/11 118/90

## 2011-10-18 NOTE — Progress Notes (Signed)
Subjective:    Patient ID: [name], male    DOB: 06-25-1965, 46 y.o.   MRN: [id number]  HPI  Here to f/u; overall doing ok,  Pt denies chest pain, increased sob or doe, wheezing, orthopnea, PND, increased LE swelling, palpitations, dizziness or syncope.  Pt denies new neurological symptoms such as new headache, or facial or extremity weakness or numbness   Pt denies polydipsia, polyuria, or low sugar symptoms such as weakness or confusion improved with po intake.  Pt states overall good compliance with meds, trying to follow lower cholesterol, diabetic diet.   Goes to [employer], tries to go several times per wk, works on the treadmill. OVerall wt started at 331, got down to 290 at one point, then steadily back to 306 now, a lot in the past 6 mo, admits to some dietary indiscretion.  Has not had pneumovax in past. Most recent INR 1.7 with "too many greens" and plans to get back to more usual diet, has f/u INR soon. Past Medical History  Diagnosis Date  . Diabetes mellitus   . Hypertension   . Unspecified vitamin D deficiency 08/07/2008  . HYPERLIPIDEMIA 08/20/2007  . CAD 05/21/2007  . Aortic valve disorders 09/30/2009  . Gross hematuria 08/07/2008  . TRANSIENT ISCHEMIC ATTACK, HX OF 12/26/2007  . NEPHROLITHIASIS, HX OF 05/21/2007  . Atrial fibrillation 07/27/2010   Past Surgical History  Procedure Date  . Appendectomy 1998  . Coronary artery bypass graft 2000    reports that he has never smoked. He does not have any smokeless tobacco history on file. He reports that he does not drink alcohol or use illicit drugs. family history includes Coronary artery disease (age of onset:50) in his other; Diabetes in his brother and mother; and Hypertension in his mother. Allergies  Allergen Reactions  . Insulin Glargine     edema   Current Outpatient Prescriptions on File Prior to Visit  Medication Sig Dispense Refill  . amLODipine (NORVASC) 10 MG tablet TAKE 1 TABLET BY MOUTH ONCE DAILY FOR  HIGH BLOOD PRESSURE  30 tablet  11  . aspirin 81 MG EC tablet Take 81 mg by mouth daily.        . Cholecalciferol (VITAMIN D3) 1000 UNITS CAPS Take by mouth daily.        . cloNIDine (CATAPRES) 0.1 MG tablet Take 1 tablet (0.1 mg total) by mouth 2 (two) times daily.  60 tablet  12  . furosemide (LASIX) 40 MG tablet take 1 tablet by mouth once daily  30 tablet  11  . Insulin Syringe-Needle U-100 (BD INSULIN SYRINGE ULTRAFINE) 31G X 5/16" 1 ML MISC Use as directed for blood sugar       . KLOR-CON M20 20 MEQ tablet TAKE 2 TABLETS BY MOUTH ONCE DAILY  60 tablet  11  . losartan (COZAAR) 100 MG tablet take 1 tablet by mouth once daily  30 tablet  11  . metFORMIN (GLUCOPHAGE) 1000 MG tablet take 1 tablet by mouth twice a day with food  60 tablet  10  . metoprolol (TOPROL-XL) 200 MG 24 hr tablet Take 200 mg by mouth daily.        . potassium chloride (KLOR-CON) 20 MEQ packet Take 20 mEq by mouth 2 (two) times daily.        . pravastatin (PRAVACHOL) 40 MG tablet TAKE 1 TABLET BY MOUTH ONCE DAILY  30 tablet  10  . warfarin (COUMADIN) 10 MG tablet Take 1 tablet (10 mg  total) by mouth as directed.  35 tablet  3   Review of Systems Review of Systems  Constitutional: Negative for diaphoresis HENT: Negative for drooling and tinnitus.   Eyes: Negative for photophobia and visual disturbance.  Respiratory: Negative for choking, cough  Gastrointestinal: Negative for vomiting and blood in stool.  Genitourinary: Negative for hematuria and decreased urine volume.  Musculoskeletal: Negative for gait problem.  Skin: Negative for color change and wound.  Neurological: Negative for tremors and numbness.  Psychiatric/Behavioral: Negative for decreased concentration. The patient is not hyperactive.       Objective:   Physical Exam BP 130/90  Pulse 70  Temp(Src) 97.4 F (36.3 C) (Oral)  Ht 6\' 2"  (1.88 m)  Wt 306 lb (138.801 kg)  BMI 39.29 kg/m2  SpO2 97% Physical Exam  VS noted Constitutional: Pt appears  well-developed and well-nourished.  HENT: Head: Normocephalic.  Right Ear: External ear normal.  Left Ear: External ear normal.  Eyes: Conjunctivae and EOM are normal. Pupils are equal, round, and reactive to light.  Neck: Normal range of motion. Neck supple.  Cardiovascular: Normal rate and regular rhythm.   Pulmonary/Chest: Effort normal and breath sounds normal.  Abd:  Soft, NT, non-distended, + BS Neurological: Pt is alert. No cranial nerve deficit.  Skin: Skin is warm. No erythema.  Psychiatric: Pt behavior is normal. Thought content normal.     Assessment & Plan:

## 2011-11-03 ENCOUNTER — Ambulatory Visit (INDEPENDENT_AMBULATORY_CARE_PROVIDER_SITE_OTHER): Payer: Medicare Other | Admitting: *Deleted

## 2011-11-03 DIAGNOSIS — I4891 Unspecified atrial fibrillation: Secondary | ICD-10-CM

## 2011-11-03 DIAGNOSIS — Z8679 Personal history of other diseases of the circulatory system: Secondary | ICD-10-CM | POA: Diagnosis not present

## 2011-11-03 DIAGNOSIS — I359 Nonrheumatic aortic valve disorder, unspecified: Secondary | ICD-10-CM

## 2011-11-03 LAB — POCT INR: INR: 1.8

## 2011-11-17 ENCOUNTER — Ambulatory Visit (INDEPENDENT_AMBULATORY_CARE_PROVIDER_SITE_OTHER): Payer: Medicare Other | Admitting: *Deleted

## 2011-11-17 DIAGNOSIS — I359 Nonrheumatic aortic valve disorder, unspecified: Secondary | ICD-10-CM

## 2011-11-17 DIAGNOSIS — I4891 Unspecified atrial fibrillation: Secondary | ICD-10-CM

## 2011-11-17 DIAGNOSIS — Z8679 Personal history of other diseases of the circulatory system: Secondary | ICD-10-CM | POA: Diagnosis not present

## 2011-11-17 DIAGNOSIS — G459 Transient cerebral ischemic attack, unspecified: Secondary | ICD-10-CM | POA: Diagnosis not present

## 2011-11-17 LAB — POCT INR: INR: 3.5

## 2011-11-20 ENCOUNTER — Other Ambulatory Visit: Payer: Self-pay | Admitting: Internal Medicine

## 2011-12-15 ENCOUNTER — Ambulatory Visit (INDEPENDENT_AMBULATORY_CARE_PROVIDER_SITE_OTHER): Payer: Medicare Other | Admitting: *Deleted

## 2011-12-15 DIAGNOSIS — I359 Nonrheumatic aortic valve disorder, unspecified: Secondary | ICD-10-CM | POA: Diagnosis not present

## 2011-12-15 DIAGNOSIS — I4891 Unspecified atrial fibrillation: Secondary | ICD-10-CM | POA: Diagnosis not present

## 2011-12-15 DIAGNOSIS — G459 Transient cerebral ischemic attack, unspecified: Secondary | ICD-10-CM

## 2011-12-15 DIAGNOSIS — Z8679 Personal history of other diseases of the circulatory system: Secondary | ICD-10-CM

## 2011-12-15 LAB — POCT INR: INR: 3

## 2011-12-31 ENCOUNTER — Other Ambulatory Visit: Payer: Self-pay | Admitting: Internal Medicine

## 2012-01-12 ENCOUNTER — Ambulatory Visit (INDEPENDENT_AMBULATORY_CARE_PROVIDER_SITE_OTHER): Payer: Medicare Other | Admitting: *Deleted

## 2012-01-12 DIAGNOSIS — Z8679 Personal history of other diseases of the circulatory system: Secondary | ICD-10-CM | POA: Diagnosis not present

## 2012-01-12 DIAGNOSIS — G459 Transient cerebral ischemic attack, unspecified: Secondary | ICD-10-CM

## 2012-01-12 DIAGNOSIS — I359 Nonrheumatic aortic valve disorder, unspecified: Secondary | ICD-10-CM

## 2012-01-12 DIAGNOSIS — I4891 Unspecified atrial fibrillation: Secondary | ICD-10-CM | POA: Diagnosis not present

## 2012-02-09 ENCOUNTER — Ambulatory Visit (INDEPENDENT_AMBULATORY_CARE_PROVIDER_SITE_OTHER): Payer: Medicare Other

## 2012-02-09 DIAGNOSIS — G459 Transient cerebral ischemic attack, unspecified: Secondary | ICD-10-CM

## 2012-02-09 DIAGNOSIS — Z8679 Personal history of other diseases of the circulatory system: Secondary | ICD-10-CM

## 2012-02-09 DIAGNOSIS — I4891 Unspecified atrial fibrillation: Secondary | ICD-10-CM | POA: Diagnosis not present

## 2012-02-09 DIAGNOSIS — I359 Nonrheumatic aortic valve disorder, unspecified: Secondary | ICD-10-CM

## 2012-03-22 ENCOUNTER — Ambulatory Visit (INDEPENDENT_AMBULATORY_CARE_PROVIDER_SITE_OTHER): Payer: Medicare Other | Admitting: *Deleted

## 2012-03-22 DIAGNOSIS — G459 Transient cerebral ischemic attack, unspecified: Secondary | ICD-10-CM | POA: Diagnosis not present

## 2012-03-22 DIAGNOSIS — I359 Nonrheumatic aortic valve disorder, unspecified: Secondary | ICD-10-CM

## 2012-03-22 DIAGNOSIS — Z8679 Personal history of other diseases of the circulatory system: Secondary | ICD-10-CM

## 2012-03-22 DIAGNOSIS — I4891 Unspecified atrial fibrillation: Secondary | ICD-10-CM | POA: Diagnosis not present

## 2012-04-07 ENCOUNTER — Other Ambulatory Visit: Payer: Self-pay | Admitting: Internal Medicine

## 2012-04-09 ENCOUNTER — Encounter (HOSPITAL_COMMUNITY): Payer: Self-pay | Admitting: *Deleted

## 2012-04-09 ENCOUNTER — Emergency Department (HOSPITAL_COMMUNITY)
Admission: EM | Admit: 2012-04-09 | Discharge: 2012-04-10 | Disposition: A | Discharge status: Home / Self Care | Payer: Medicare Other | Attending: Emergency Medicine | Admitting: Emergency Medicine

## 2012-04-09 ENCOUNTER — Ambulatory Visit (INDEPENDENT_AMBULATORY_CARE_PROVIDER_SITE_OTHER): Payer: Medicare Other | Admitting: Pharmacist

## 2012-04-09 ENCOUNTER — Emergency Department (HOSPITAL_COMMUNITY): Payer: Medicare Other

## 2012-04-09 DIAGNOSIS — Z8679 Personal history of other diseases of the circulatory system: Secondary | ICD-10-CM | POA: Diagnosis not present

## 2012-04-09 DIAGNOSIS — Z7982 Long term (current) use of aspirin: Secondary | ICD-10-CM | POA: Diagnosis not present

## 2012-04-09 DIAGNOSIS — G459 Transient cerebral ischemic attack, unspecified: Secondary | ICD-10-CM

## 2012-04-09 DIAGNOSIS — R000 Tachycardia, unspecified: Secondary | ICD-10-CM | POA: Diagnosis not present

## 2012-04-09 DIAGNOSIS — I2510 Atherosclerotic heart disease of native coronary artery without angina pectoris: Secondary | ICD-10-CM | POA: Diagnosis not present

## 2012-04-09 DIAGNOSIS — Z87442 Personal history of urinary calculi: Secondary | ICD-10-CM | POA: Insufficient documentation

## 2012-04-09 DIAGNOSIS — E785 Hyperlipidemia, unspecified: Secondary | ICD-10-CM | POA: Diagnosis not present

## 2012-04-09 DIAGNOSIS — R002 Palpitations: Secondary | ICD-10-CM | POA: Insufficient documentation

## 2012-04-09 DIAGNOSIS — Z8673 Personal history of transient ischemic attack (TIA), and cerebral infarction without residual deficits: Secondary | ICD-10-CM | POA: Insufficient documentation

## 2012-04-09 DIAGNOSIS — E119 Type 2 diabetes mellitus without complications: Secondary | ICD-10-CM | POA: Diagnosis not present

## 2012-04-09 DIAGNOSIS — I10 Essential (primary) hypertension: Secondary | ICD-10-CM | POA: Insufficient documentation

## 2012-04-09 DIAGNOSIS — I359 Nonrheumatic aortic valve disorder, unspecified: Secondary | ICD-10-CM | POA: Diagnosis not present

## 2012-04-09 DIAGNOSIS — I4891 Unspecified atrial fibrillation: Secondary | ICD-10-CM | POA: Diagnosis not present

## 2012-04-09 DIAGNOSIS — Z79899 Other long term (current) drug therapy: Secondary | ICD-10-CM | POA: Insufficient documentation

## 2012-04-09 DIAGNOSIS — E559 Vitamin D deficiency, unspecified: Secondary | ICD-10-CM | POA: Insufficient documentation

## 2012-04-09 LAB — CBC WITH DIFFERENTIAL/PLATELET
Basophils Absolute: 0 10*3/uL (ref 0.0–0.1)
Eosinophils Absolute: 0.3 10*3/uL (ref 0.0–0.7)
Eosinophils Relative: 4 % (ref 0–5)
MCH: 28.1 pg (ref 26.0–34.0)
MCV: 78.4 fL (ref 78.0–100.0)
Neutrophils Relative %: 54 % (ref 43–77)
Platelets: 174 10*3/uL (ref 150–400)
RBC: 5.13 MIL/uL (ref 4.22–5.81)
RDW: 15.6 % — ABNORMAL HIGH (ref 11.5–15.5)
WBC: 7.3 10*3/uL (ref 4.0–10.5)

## 2012-04-09 LAB — COMPREHENSIVE METABOLIC PANEL
Albumin: 3.8 g/dL (ref 3.5–5.2)
Alkaline Phosphatase: 61 U/L (ref 39–117)
BUN: 15 mg/dL (ref 6–23)
CO2: 27 mEq/L (ref 19–32)
Chloride: 102 mEq/L (ref 96–112)
Creatinine, Ser: 1.1 mg/dL (ref 0.50–1.35)
GFR calc non Af Amer: 78 mL/min — ABNORMAL LOW (ref >90)
Potassium: 3.6 mEq/L (ref 3.5–5.1)
Total Bilirubin: 0.7 mg/dL (ref 0.3–1.2)

## 2012-04-09 LAB — PROTIME-INR
INR: 1.62 — ABNORMAL HIGH (ref 0.00–1.49)
Prothrombin Time: 18.7 seconds — ABNORMAL HIGH (ref 11.6–15.2)

## 2012-04-09 IMAGING — CR DG CHEST 2V
2 series · 2 of 2 positions shown · non-contrast
Comparison: 05/26/2010.

CLINICAL DATA: Tachycardia.

CHEST - 2 VIEW

[w chest pa]
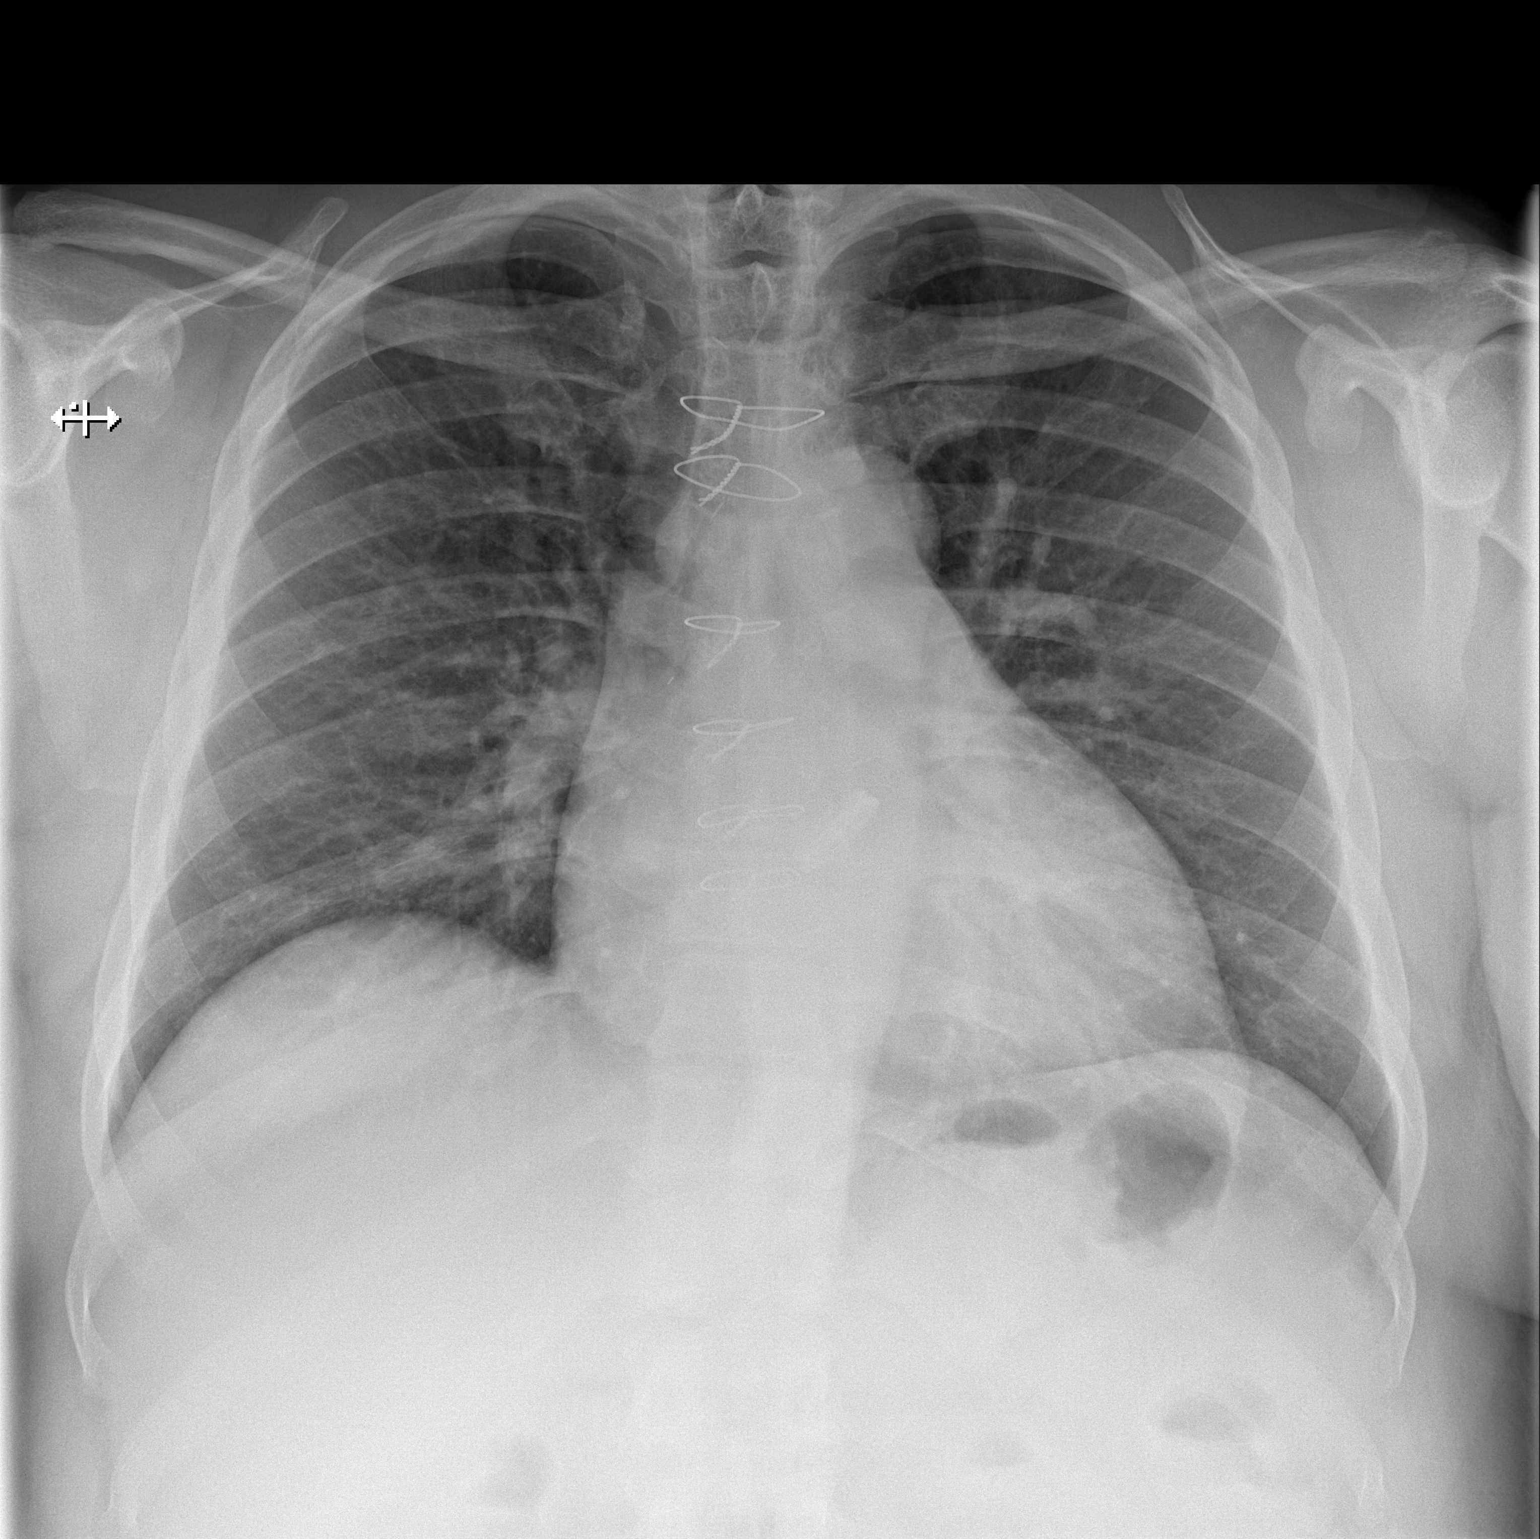

[w chest lat]
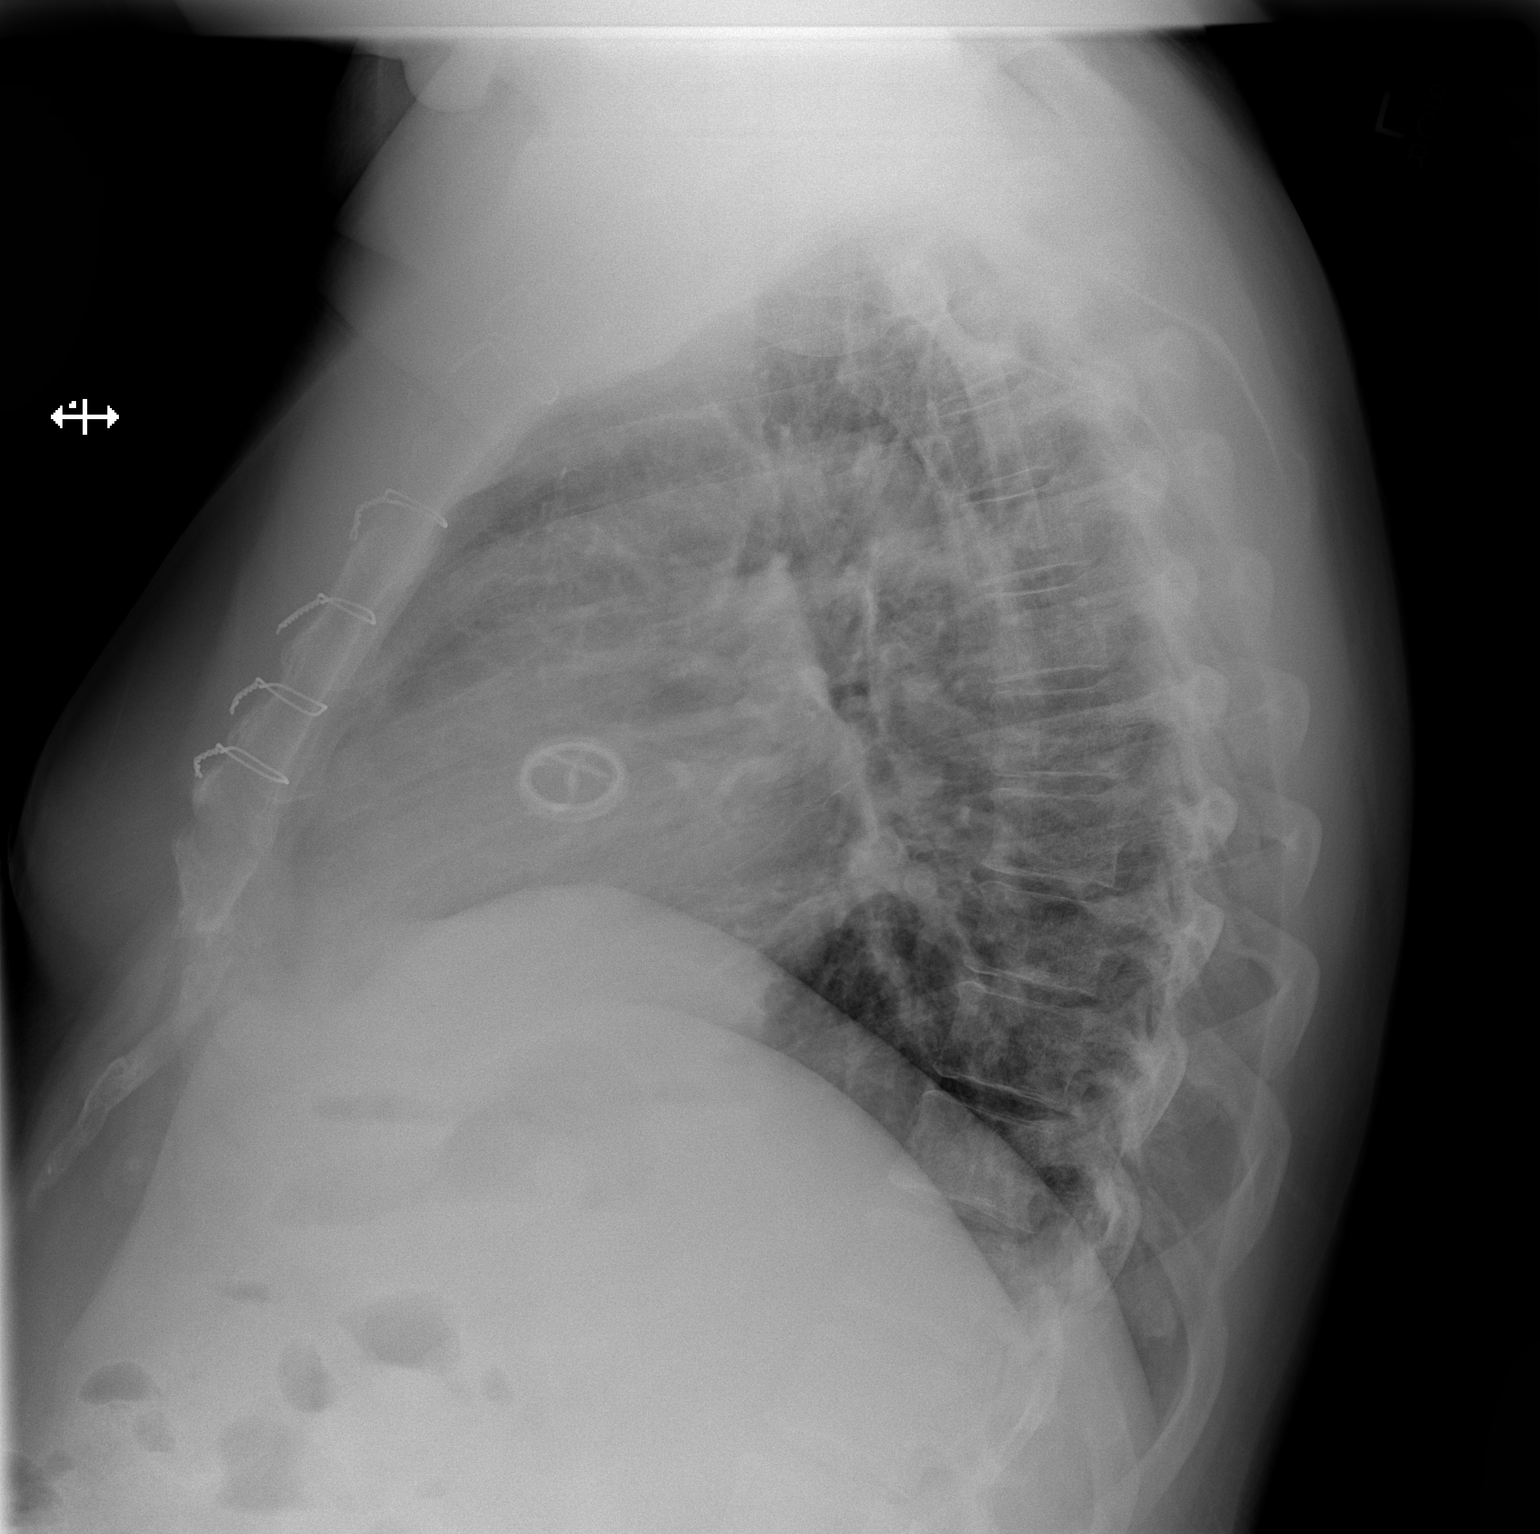

[2 of 2 positions shown; findings below may reference images not displayed]

FINDINGS: The heart is normal and stable in size.  Stable surgical
changes from aortic valve replacement surgery.  The lungs are
clear.  No pleural effusion.  Stable mild eventration of the right
hemidiaphragm.
IMPRESSION: No acute cardiopulmonary findings.

## 2012-04-09 MED ORDER — METOPROLOL TARTRATE 1 MG/ML IV SOLN
5.0000 mg | Freq: Once | INTRAVENOUS | Status: AC
  Administered 2012-04-09: 5 mg via INTRAVENOUS
  Filled 2012-04-09: qty 5

## 2012-04-09 NOTE — ED Notes (Signed)
C/o fast HR, denies other sx. (denies: CP, sob, fever, cough, congestion, cold sx, syncope, dizziness, nvd or other sx). Onset around noon. Fast, constant and steady since onset. Has a mechanical heart valve.

## 2012-04-09 NOTE — ED Provider Notes (Addendum)
History     CSN: [id number]  Arrival date & time 04/09/12  2107   First MD Initiated Contact with Patient 04/09/12 2256      Chief Complaint  Patient presents with  . Tachycardia    (Consider location/radiation/quality/duration/timing/severity/associated sxs/prior treatment) Patient is a 46 y.o. male presenting with palpitations. The history is provided by the patient.  Palpitations  This is a new problem. The current episode started 6 to 12 hours ago. The problem occurs constantly. The problem has not changed since onset.Associated symptoms include chest pressure. He has tried nothing for the symptoms. There are no known risk factors.    Past Medical History  Diagnosis Date  . Diabetes mellitus   . Hypertension   . Unspecified vitamin D deficiency 08/07/2008  . HYPERLIPIDEMIA 08/20/2007  . CAD 05/21/2007  . Aortic valve disorders 09/30/2009  . Gross hematuria 08/07/2008  . TRANSIENT ISCHEMIC ATTACK, HX OF 12/26/2007  . NEPHROLITHIASIS, HX OF 05/21/2007  . Atrial fibrillation 07/27/2010    Past Surgical History  Procedure Date  . Appendectomy 1998  . Coronary artery bypass graft 2000    Family History  Problem Relation Age of Onset  . Diabetes Mother   . Hypertension Mother   . Diabetes Brother   . Coronary artery disease Other 19    male 1st degree relative, CABG in his 89's (father)    History  Substance Use Topics  . Smoking status: Never Smoker   . Smokeless tobacco: Not on file  . Alcohol Use: No      Review of Systems  Cardiovascular: Positive for palpitations.  All other systems reviewed and are negative.    Allergies  Insulin glargine  Home Medications   Current Outpatient Rx  Name  Route  Sig  Dispense  Refill  . AMLODIPINE BESYLATE 10 MG PO TABS      TAKE 1 TABLET BY MOUTH ONCE DAILY FOR HIGH BLOOD PRESSURE   30 tablet   11   . ASPIRIN 81 MG PO TBEC   Oral   Take 81 mg by mouth daily.           [name] VITAMIN D3 1000 UNITS PO CAPS  Oral   Take by mouth daily.           [name] CLONIDINE HCL 0.1 MG PO TABS      take 1 tablet by mouth twice a day   60 tablet   11   . FUROSEMIDE 40 MG PO TABS      take 1 tablet by mouth once daily   30 tablet   11   . GLIMEPIRIDE 1 MG PO TABS   Oral   Take 1 tablet (1 mg total) by mouth daily before breakfast.   90 tablet   3   . INSULIN SYRINGE-NEEDLE U-100 31G X 5/16" 1 ML MISC      Use as directed for blood sugar          . KLOR-CON M20 20 MEQ PO TBCR      TAKE 2 TABLETS BY MOUTH ONCE DAILY   60 tablet   11   . LOSARTAN POTASSIUM 100 MG PO TABS      take 1 tablet by mouth once daily   30 tablet   11   . METFORMIN HCL 1000 MG PO TABS      take 1 tablet by mouth twice a day with food   60 tablet   10   . METOPROLOL SUCCINATE  ER 200 MG PO TB24   Oral   Take 200 mg by mouth daily.           [name] POTASSIUM CHLORIDE 20 MEQ PO PACK   Oral   Take 20 mEq by mouth 2 (two) times daily.           [name] PRAVASTATIN SODIUM 40 MG PO TABS      TAKE 1 TABLET BY MOUTH ONCE DAILY   30 tablet   10   . WARFARIN SODIUM 10 MG PO TABS      take 1 tablet by mouth once daily as directed   30 tablet   3     BP 126/99  Pulse 98  Temp 98.2 F (36.8 C) (Oral)  Resp 16  Ht 6\' 2"  (1.88 m)  Wt 295 lb (133.811 kg)  BMI 37.88 kg/m2  SpO2 100%  Physical Exam  Constitutional: He is oriented to person, place, and time. He appears well-developed and well-nourished.  HENT:  Head: Normocephalic and atraumatic.  Eyes: Conjunctivae normal are normal. Pupils are equal, round, and reactive to light.  Neck: Normal range of motion. Neck supple.  Cardiovascular: Normal rate, regular rhythm, normal heart sounds and intact distal pulses.   Pulmonary/Chest: Effort normal and breath sounds normal.  Abdominal: Soft. Bowel sounds are normal.  Neurological: He is alert and oriented to person, place, and time.  Skin: Skin is warm and dry.  Psychiatric: He has a normal mood and affect.  His behavior is normal. Judgment and thought content normal.    ED Course  Procedures (including critical care time)  Labs Reviewed  COMPREHENSIVE METABOLIC PANEL - Abnormal; Notable for the following:    Glucose, Bld 131 (*)     GFR calc non Af Amer 78 (*)     All other components within normal limits  CBC WITH DIFFERENTIAL - Abnormal; Notable for the following:    RDW 15.6 (*)     All other components within normal limits  PROTIME-INR - Abnormal; Notable for the following:    Prothrombin Time 18.7 (*)     INR 1.62 (*)     All other components within normal limits  POCT I-STAT TROPONIN I   Dg Chest 2 View  04/09/2012  *RADIOLOGY REPORT*  Clinical Data: Tachycardia.  CHEST - 2 VIEW  Comparison: 05/26/2010.  Findings: The heart is normal and stable in size.  Stable surgical changes from aortic valve replacement surgery.  The lungs are clear.  No pleural effusion.  Stable mild eventration of the right hemidiaphragm.  IMPRESSION: No acute cardiopulmonary findings.   Original Report Authenticated By: Rudie Meyer, M.D.      No diagnosis found.   Date: 04/09/2012  Rate: 98  Rhythm: atrial flutter  QRS Axis: left  Intervals: normal  ST/T Wave abnormalities: nonspecific T wave changes  Conduction Disutrbances:none  Narrative Interpretation:   Old EKG Reviewed: none available    MDM  + palpitation.  Aflutter on coumadin,  Metoprolol.  inr subtherapeutic.  Will consult [facility] cards,  reassess  Seen by labeaur in ed.  Coumadin clinic today.  Will lovenox.  Dc to outp fu tomorrow.  Ret new/worsenign sxs.  ce neg.  No cp.  No leg swelling      Rosanne Ashing, MD 04/09/12 8295  Rosanne Ashing, MD 04/10/12 6213

## 2012-04-10 MED ORDER — ENOXAPARIN SODIUM 60 MG/0.6ML ~~LOC~~ SOLN
1.0000 mg/kg | Freq: Once | SUBCUTANEOUS | Status: DC
  Filled 2012-04-10: qty 0.6

## 2012-04-10 MED ORDER — ENOXAPARIN SODIUM 150 MG/ML ~~LOC~~ SOLN
135.0000 mg | Freq: Once | SUBCUTANEOUS | Status: AC
  Administered 2012-04-10: 150 mg via SUBCUTANEOUS
  Filled 2012-04-10: qty 1

## 2012-04-10 NOTE — ED Notes (Signed)
Awaiting Lovenox arrival from pharmacy to discharge the patient.

## 2012-04-10 NOTE — Consult Note (Signed)
CARDIOLOGY CONSULT NOTE  Patient ID: [name], MRN: [id number], DOB/AGE: 47-Apr-1967 47 y.o. Admit date: 04/09/2012 Date of Consult: 04/10/2012  Primary Physician: [name], MD Primary Cardiologist: Dr. Jens Som  Chief Complaint: palpitations Reason for Consultation: atrial fibrillation  HPI: 46 y.o. male w/ PMHx significant for asc aortic dissection s/p mech AVR with conduit 2000, HTN, DM, h/o atrial fibrillation  who presented to [facility] on 04/10/2012 with complaints of palpitations. He reports while watching the football game, he noticed that his heart was beating funny and fast. No recollection of this happening before despite diagnosis of afib in chart. No chest pain, no syncope. Able to ambulate.  Earlier in the day, went to the anti-coag clinic and was found to be subtherapeutic at 1.8. Has followup in 2 weeks for a recheck.  Currently, rates in the 80s. Feels at baseline and is unable to feel palpitations that brought him in. Has been ambulating.  Telemetry shows rates in the 80s. Still coarse afib/flutter.  Past Medical History  Diagnosis Date  . Diabetes mellitus   . Hypertension   . Unspecified vitamin D deficiency 08/07/2008  . HYPERLIPIDEMIA 08/20/2007  . CAD 05/21/2007  . Aortic valve disorders 09/30/2009  . Gross hematuria 08/07/2008  . TRANSIENT ISCHEMIC ATTACK, HX OF 12/26/2007  . NEPHROLITHIASIS, HX OF 05/21/2007  . Atrial fibrillation 07/27/2010      Surgical History:  Past Surgical History  Procedure Date  . Appendectomy 1998  . Coronary artery bypass graft 2000     Home Meds: Prior to Admission medications   Medication Sig Start Date End Date Taking? Authorizing Provider  amLODipine (NORVASC) 10 MG tablet TAKE 1 TABLET BY MOUTH ONCE DAILY FOR HIGH BLOOD PRESSURE 05/07/11  Yes [name], MD  aspirin 81 MG EC tablet Take 81 mg by mouth daily.     Yes Historical Provider, MD  Cholecalciferol (VITAMIN D3) 1000 UNITS CAPS Take by mouth  daily.     Yes Historical Provider, MD  cloNIDine (CATAPRES) 0.1 MG tablet take 1 tablet by mouth twice a day 11/20/11  Yes Dolores Patty, MD  furosemide (LASIX) 40 MG tablet take 1 tablet by mouth once daily 11/20/11  Yes [name], MD  glimepiride (AMARYL) 1 MG tablet Take 1 tablet (1 mg total) by mouth daily before breakfast. 10/18/11 10/17/12 Yes [name], MD  KLOR-CON M20 20 MEQ tablet TAKE 2 TABLETS BY MOUTH ONCE DAILY 05/07/11  Yes [name], MD  losartan (COZAAR) 100 MG tablet take 1 tablet by mouth once daily 11/20/11  Yes [name], MD  metFORMIN (GLUCOPHAGE) 1000 MG tablet take 1 tablet by mouth twice a day with food 12/31/11  Yes [name], MD  metoprolol (TOPROL-XL) 200 MG 24 hr tablet Take 200 mg by mouth daily.     Yes Historical Provider, MD  pravastatin (PRAVACHOL) 40 MG tablet TAKE 1 TABLET BY MOUTH ONCE DAILY 12/31/11  Yes [name], MD  warfarin (COUMADIN) 5 MG tablet Take 5-10 mg by mouth daily. 5 mg on all days except Sunday. On Sunday, 10 mg   Yes Historical Provider, MD  Insulin Syringe-Needle U-100 (BD INSULIN SYRINGE ULTRAFINE) 31G X 5/16" 1 ML MISC Use as directed for blood sugar     Historical Provider, MD    Inpatient Medications:    . [COMPLETED] metoprolol  5 mg Intravenous Once  . pneumococcal 23 valent vaccine  0.5 mL Intramuscular Once  Allergies:  Allergies  Allergen Reactions  . Insulin Glargine     edema    History   Social History  . Marital Status: Legally Separated    Spouse Name: N/A    Number of Children: 4  . Years of Education: N/A   Occupational History  . DISABLED    Social History Main Topics  . Smoking status: Never Smoker   . Smokeless tobacco: Not on file  . Alcohol Use: No  . Drug Use: No  . Sexually Active: Not on file   Other Topics Concern  . Not on file   Social History Narrative  . No narrative on file     Family History  Problem Relation Age of Onset  . Diabetes Mother   . Hypertension  Mother   . Diabetes Brother   . Coronary artery disease Other 50    male 1st degree relative, CABG in his 26's (father)     Review of Systems: General: negative for chills, fever, night sweats or weight changes.  Cardiovascular: as per HPI. Dermatological: negative for rash Respiratory: negative for cough or wheezing Urologic: negative for hematuria Abdominal: negative for nausea, vomiting, diarrhea, bright red blood per rectum, melena, or hematemesis Neurologic: negative for visual changes, syncope, or dizziness All other systems reviewed and are otherwise negative except as noted above.  Labs: No results found for this basename: CKTOTAL:4,CKMB:4,TROPONINI:4 in the last 72 hours Lab Results  Component Value Date   WBC 7.3 04/09/2012   HGB 14.4 04/09/2012   HCT 40.2 04/09/2012   MCV 78.4 04/09/2012   PLT 174 04/09/2012    Lab 04/09/12 2133  NA 139  K 3.6  CL 102  CO2 27  BUN 15  CREATININE 1.10  CALCIUM 9.5  PROT 8.1  BILITOT 0.7  ALKPHOS 61  ALT 12  AST 23  GLUCOSE 131*    Radiology/Studies:  Dg Chest 2 View  04/09/2012  *RADIOLOGY REPORT*  Clinical Data: Tachycardia.  CHEST - 2 VIEW  Comparison: 05/26/2010.  Findings: The heart is normal and stable in size.  Stable surgical changes from aortic valve replacement surgery.  The lungs are clear.  No pleural effusion.  Stable mild eventration of the right hemidiaphragm.  IMPRESSION: No acute cardiopulmonary findings.   Original Report Authenticated By: Rudie Meyer, M.D.     EKG: coarse afib.fluttter, rate controlled, no TW changes, LVH by AVL crit  Physical Exam: Blood pressure 136/89, pulse 82, temperature 98.2 F (36.8 C), temperature source Oral, resp. rate 23, height 6\' 2"  (1.88 m), weight 133.811 kg (295 lb), SpO2 99.00%. General: Well developed, well nourished, in no acute distress. Head: Normocephalic, atraumatic, sclera non-icteric, no xanthomas, nares are without discharge.  Neck: Supple. Negative for  carotid bruits. JVD not elevated. Lungs: Clear bilaterally to auscultation without wheezes, rales, or rhonchi. Breathing is unlabored. Heart: crisp AV closure, no murmur. Abdomen: Soft, non-tender, non-distended with normoactive bowel sounds. No hepatomegaly. No rebound/guarding. No obvious abdominal masses. Msk:  Strength and tone appear normal for age. Extremities: No clubbing or cyanosis. No edema.  Distal pedal pulses are 2+ and equal bilaterally. Neuro: Alert and oriented X 3. Moves all extremities spontaneously. Psych:  Responds to questions appropriately with a normal affect.   Problem List 1. Coarse afib/flutter, rate controlled 2. S/p mech AVR 3. Sub-therapeutic INR 4. H/o TIA  Assessment and Plan:  46 y.o. male w/ PMHx significant for asc aortic dissection s/p mech AVR with conduit 2000, HTN, DM, h/o atrial  fibrillation  who presented to [facility] on 04/10/2012 with complaints of palpitations; found to be in coarse afib/flutter with controlled rate.  Has had relative recent echo (07/2011) that demonstrated bilateral dilation of his atrium putting him at risk of developing this arrhythmia. Furthermore, he has had afib in the past.  He is rather asymptomatic at this point and is without red flag symptoms (chest pain, syncope, pre-syncope, CHF). Patient desires discharge home. He does not warrant admission for this and outpatient followup should suffice. Due to an elevated risk of stroke (h/o TIA, HTN, diabetes) and his mechanical valve, I would favor bridging him with full dose subq lovenox. He is injection savvy given his diabetes.  Will need close follow up with anti-coag clinic as well as follow arranged with cardiology. Will assist with this.  Recs: -lovenox x 5 days, continue toprol, coumadin -follow up with anti-coag clinic (will arrange) -follow up with cardiology (will arrange)  Signed, Cadience Bradfield C. MD 04/10/2012, 1:16 AM

## 2012-04-10 NOTE — ED Notes (Signed)
The patient is AOx4 and comfortable with his discharge instructions. 

## 2012-04-25 ENCOUNTER — Ambulatory Visit: Payer: Medicare Other | Admitting: Internal Medicine

## 2012-04-26 ENCOUNTER — Ambulatory Visit (INDEPENDENT_AMBULATORY_CARE_PROVIDER_SITE_OTHER): Payer: Medicare Other

## 2012-04-26 DIAGNOSIS — G459 Transient cerebral ischemic attack, unspecified: Secondary | ICD-10-CM | POA: Diagnosis not present

## 2012-04-26 DIAGNOSIS — I359 Nonrheumatic aortic valve disorder, unspecified: Secondary | ICD-10-CM

## 2012-04-26 DIAGNOSIS — I4891 Unspecified atrial fibrillation: Secondary | ICD-10-CM

## 2012-04-26 DIAGNOSIS — Z8679 Personal history of other diseases of the circulatory system: Secondary | ICD-10-CM | POA: Diagnosis not present

## 2012-05-04 ENCOUNTER — Other Ambulatory Visit (INDEPENDENT_AMBULATORY_CARE_PROVIDER_SITE_OTHER): Payer: Medicare Other

## 2012-05-04 ENCOUNTER — Encounter: Payer: Self-pay | Admitting: Internal Medicine

## 2012-05-04 ENCOUNTER — Ambulatory Visit (INDEPENDENT_AMBULATORY_CARE_PROVIDER_SITE_OTHER): Payer: Medicare Other | Admitting: Internal Medicine

## 2012-05-04 VITALS — BP 112/70 | HR 63 | Temp 98.0°F | Ht 74.0 in | Wt 306.2 lb

## 2012-05-04 DIAGNOSIS — N320 Bladder-neck obstruction: Secondary | ICD-10-CM

## 2012-05-04 DIAGNOSIS — E119 Type 2 diabetes mellitus without complications: Secondary | ICD-10-CM

## 2012-05-04 DIAGNOSIS — E785 Hyperlipidemia, unspecified: Secondary | ICD-10-CM | POA: Diagnosis not present

## 2012-05-04 DIAGNOSIS — I10 Essential (primary) hypertension: Secondary | ICD-10-CM

## 2012-05-04 LAB — TSH: TSH: 5.13 u[IU]/mL (ref 0.35–5.50)

## 2012-05-04 LAB — LIPID PANEL
HDL: 30.9 mg/dL — ABNORMAL LOW (ref >39.00)
LDL Cholesterol: 72 mg/dL (ref 0–99)
Total CHOL/HDL Ratio: 4
Triglycerides: 141 mg/dL (ref 0.0–149.0)
VLDL: 28.2 mg/dL (ref 0.0–40.0)

## 2012-05-04 LAB — URINALYSIS, ROUTINE W REFLEX MICROSCOPIC
Bilirubin Urine: NEGATIVE
Ketones, ur: NEGATIVE
Urine Glucose: NEGATIVE
Urobilinogen, UA: 0.2 (ref 0.0–1.0)

## 2012-05-04 NOTE — Patient Instructions (Addendum)
Continue all other medications as before Please go to LAB in the Basement for the blood and/or urine tests to be done today You will be contacted by phone if any changes need to be made immediately.  Otherwise, you will receive a letter about your results with an explanation, but please check with MyChart first. Thank you for enrolling in MyChart. Please follow the instructions below to securely access your online medical record. MyChart allows you to send messages to your doctor, view your test results, renew your prescriptions, schedule appointments, and more. To Log into MyChart, please go to [email or web address], and your Username is: [name] You are otherwise up to date with prevention Please continue your efforts at being more active, low cholesterol diabetic diet, and weight control. Please keep your appointments with your specialists as you have planned  - the coumadin clinic and cardiology Please call if you wish to change your coumadin clinic to the [facility] site (here) Please return in 6 months, or sooner if needed

## 2012-05-05 ENCOUNTER — Encounter: Payer: Self-pay | Admitting: Internal Medicine

## 2012-05-05 NOTE — Assessment & Plan Note (Signed)
stable overall by hx and exam, most recent data reviewed with pt, and pt to continue medical treatment as before BP Readings from Last 3 Encounters:  05/04/12 112/70  04/10/12 116/84  10/18/11 130/90

## 2012-05-05 NOTE — Assessment & Plan Note (Signed)
Also for pSA as he is due, asympt,  to f/u any worsening symptoms or concerns

## 2012-05-05 NOTE — Assessment & Plan Note (Signed)
stable overall by hx and exam, most recent data reviewed with pt, and pt to continue medical treatment as before Lab Results  Component Value Date   LDLCALC 72 05/04/2012

## 2012-05-05 NOTE — Assessment & Plan Note (Signed)
stable overall by hx and exam, most recent data reviewed with pt, and pt to continue medical treatment as before Lab Results  Component Value Date   HGBA1C 6.7* 05/04/2012

## 2012-05-05 NOTE — Progress Notes (Signed)
Subjective:    Patient ID: [name], male    DOB: Oct 02, 1965, 46 y.o.   MRN: [id number]  HPI  Here to f/u; overall doing ok,  Pt denies chest pain, increased sob or doe, wheezing, orthopnea, PND, increased LE swelling, palpitations, dizziness or syncope.  Pt denies new neurological symptoms such as new headache, or facial or extremity weakness or numbness   Pt denies polydipsia, polyuria, or low sugar symptoms such as weakness or confusion improved with po intake.  Pt states overall good compliance with meds, trying to follow lower cholesterol, diabetic diet, wt overall stable but little exercise however. Past Medical History  Diagnosis Date  . Diabetes mellitus   . Hypertension   . Unspecified vitamin D deficiency 08/07/2008  . HYPERLIPIDEMIA 08/20/2007  . CAD 05/21/2007  . Aortic valve disorders 09/30/2009  . Gross hematuria 08/07/2008  . TRANSIENT ISCHEMIC ATTACK, HX OF 12/26/2007  . NEPHROLITHIASIS, HX OF 05/21/2007  . Atrial fibrillation 07/27/2010   Past Surgical History  Procedure Date  . Appendectomy 1998  . Coronary artery bypass graft 2000    reports that he has never smoked. He does not have any smokeless tobacco history on file. He reports that he does not drink alcohol or use illicit drugs. family history includes Coronary artery disease (age of onset:50) in his other; Diabetes in his brother and mother; and Hypertension in his mother. Allergies  Allergen Reactions  . Insulin Glargine     edema   Current Outpatient Prescriptions on File Prior to Visit  Medication Sig Dispense Refill  . amLODipine (NORVASC) 10 MG tablet TAKE 1 TABLET BY MOUTH ONCE DAILY FOR HIGH BLOOD PRESSURE  30 tablet  11  . aspirin 81 MG EC tablet Take 81 mg by mouth daily.        . Cholecalciferol (VITAMIN D3) 1000 UNITS CAPS Take by mouth daily.        . cloNIDine (CATAPRES) 0.1 MG tablet take 1 tablet by mouth twice a day  60 tablet  11  . furosemide (LASIX) 40 MG tablet take 1 tablet by mouth  once daily  30 tablet  11  . glimepiride (AMARYL) 1 MG tablet Take 1 tablet (1 mg total) by mouth daily before breakfast.  90 tablet  3  . Insulin Syringe-Needle U-100 (BD INSULIN SYRINGE ULTRAFINE) 31G X 5/16" 1 ML MISC Use as directed for blood sugar       . KLOR-CON M20 20 MEQ tablet TAKE 2 TABLETS BY MOUTH ONCE DAILY  60 tablet  11  . losartan (COZAAR) 100 MG tablet take 1 tablet by mouth once daily  30 tablet  11  . metFORMIN (GLUCOPHAGE) 1000 MG tablet take 1 tablet by mouth twice a day with food  60 tablet  10  . metoprolol (TOPROL-XL) 200 MG 24 hr tablet Take 200 mg by mouth daily.        . pravastatin (PRAVACHOL) 40 MG tablet TAKE 1 TABLET BY MOUTH ONCE DAILY  30 tablet  10  . warfarin (COUMADIN) 5 MG tablet Take 5-10 mg by mouth daily. 5 mg on all days except Sunday. On Sunday, 10 mg       Current Facility-Administered Medications on File Prior to Visit  Medication Dose Route Frequency Provider Last Rate Last Dose  . pneumococcal 23 valent vaccine (PNU-IMMUNE) injection 0.5 mL  0.5 mL Intramuscular Once [name], MD       Review of Systems  Constitutional: Negative for diaphoresis and unexpected weight  change.  HENT: Negative for tinnitus.   Eyes: Negative for photophobia and visual disturbance.  Respiratory: Negative for choking and stridor.   Gastrointestinal: Negative for vomiting and blood in stool.  Genitourinary: Negative for hematuria and decreased urine volume.  Musculoskeletal: Negative for gait problem.  Skin: Negative for color change and wound.  Neurological: Negative for tremors and numbness.  Psychiatric/Behavioral: Negative for decreased concentration. The patient is not hyperactive.       Objective:   Physical Exam BP 112/70  Pulse 63  Temp 98 F (36.7 C) (Oral)  Ht 6\' 2"  (1.88 m)  Wt 306 lb 4 oz (138.914 kg)  BMI 39.32 kg/m2  SpO2 97% Physical Exam  VS noted, not ill appearing Constitutional: Pt appears well-developed and well-nourished.  HENT:  Head: Normocephalic.  Right Ear: External ear normal.  Left Ear: External ear normal.  Eyes: Conjunctivae and EOM are normal. Pupils are equal, round, and reactive to light.  Neck: Normal range of motion. Neck supple.  Cardiovascular: Normal rate and regular rhythm.   Pulmonary/Chest: Effort normal and breath sounds normal.  Neurological: Pt is alert. Not confused  Skin: Skin is warm. No erythema.  Psychiatric: Pt behavior is normal. Thought content normal.     Assessment & Plan:

## 2012-05-18 ENCOUNTER — Other Ambulatory Visit: Payer: Self-pay | Admitting: Internal Medicine

## 2012-05-24 ENCOUNTER — Ambulatory Visit (INDEPENDENT_AMBULATORY_CARE_PROVIDER_SITE_OTHER): Payer: Medicare Other | Admitting: *Deleted

## 2012-05-24 DIAGNOSIS — I359 Nonrheumatic aortic valve disorder, unspecified: Secondary | ICD-10-CM | POA: Diagnosis not present

## 2012-05-24 DIAGNOSIS — I4891 Unspecified atrial fibrillation: Secondary | ICD-10-CM

## 2012-05-24 DIAGNOSIS — G459 Transient cerebral ischemic attack, unspecified: Secondary | ICD-10-CM | POA: Diagnosis not present

## 2012-05-24 DIAGNOSIS — Z8679 Personal history of other diseases of the circulatory system: Secondary | ICD-10-CM

## 2012-05-24 LAB — POCT INR: INR: 2.8

## 2012-06-21 ENCOUNTER — Ambulatory Visit (INDEPENDENT_AMBULATORY_CARE_PROVIDER_SITE_OTHER): Payer: Medicare Other

## 2012-06-21 DIAGNOSIS — G459 Transient cerebral ischemic attack, unspecified: Secondary | ICD-10-CM | POA: Diagnosis not present

## 2012-06-21 DIAGNOSIS — I359 Nonrheumatic aortic valve disorder, unspecified: Secondary | ICD-10-CM

## 2012-06-21 DIAGNOSIS — Z8679 Personal history of other diseases of the circulatory system: Secondary | ICD-10-CM

## 2012-06-21 DIAGNOSIS — I4891 Unspecified atrial fibrillation: Secondary | ICD-10-CM | POA: Diagnosis not present

## 2012-06-21 LAB — POCT INR: INR: 2.6

## 2012-08-02 ENCOUNTER — Ambulatory Visit (INDEPENDENT_AMBULATORY_CARE_PROVIDER_SITE_OTHER): Payer: Medicare Other | Admitting: *Deleted

## 2012-08-02 DIAGNOSIS — G459 Transient cerebral ischemic attack, unspecified: Secondary | ICD-10-CM | POA: Diagnosis not present

## 2012-08-02 DIAGNOSIS — I4891 Unspecified atrial fibrillation: Secondary | ICD-10-CM | POA: Diagnosis not present

## 2012-08-02 DIAGNOSIS — Z8679 Personal history of other diseases of the circulatory system: Secondary | ICD-10-CM

## 2012-08-02 DIAGNOSIS — I359 Nonrheumatic aortic valve disorder, unspecified: Secondary | ICD-10-CM

## 2012-08-02 LAB — POCT INR: INR: 2.7

## 2012-09-13 ENCOUNTER — Ambulatory Visit (INDEPENDENT_AMBULATORY_CARE_PROVIDER_SITE_OTHER): Payer: Medicare Other | Admitting: Pharmacist

## 2012-09-13 DIAGNOSIS — Z8679 Personal history of other diseases of the circulatory system: Secondary | ICD-10-CM

## 2012-09-13 DIAGNOSIS — I359 Nonrheumatic aortic valve disorder, unspecified: Secondary | ICD-10-CM

## 2012-09-13 DIAGNOSIS — I4891 Unspecified atrial fibrillation: Secondary | ICD-10-CM

## 2012-09-13 DIAGNOSIS — G459 Transient cerebral ischemic attack, unspecified: Secondary | ICD-10-CM

## 2012-09-13 LAB — POCT INR: INR: 3.3

## 2012-09-25 ENCOUNTER — Other Ambulatory Visit: Payer: Self-pay | Admitting: *Deleted

## 2012-09-25 ENCOUNTER — Other Ambulatory Visit: Payer: Self-pay | Admitting: Internal Medicine

## 2012-10-11 ENCOUNTER — Ambulatory Visit (INDEPENDENT_AMBULATORY_CARE_PROVIDER_SITE_OTHER): Payer: Medicare Other

## 2012-10-11 DIAGNOSIS — G459 Transient cerebral ischemic attack, unspecified: Secondary | ICD-10-CM

## 2012-10-11 DIAGNOSIS — I4891 Unspecified atrial fibrillation: Secondary | ICD-10-CM

## 2012-10-11 DIAGNOSIS — Z8679 Personal history of other diseases of the circulatory system: Secondary | ICD-10-CM | POA: Diagnosis not present

## 2012-10-11 DIAGNOSIS — I359 Nonrheumatic aortic valve disorder, unspecified: Secondary | ICD-10-CM | POA: Diagnosis not present

## 2012-10-11 LAB — POCT INR: INR: 2.9

## 2012-11-01 ENCOUNTER — Encounter: Payer: Self-pay | Admitting: Cardiology

## 2012-11-02 ENCOUNTER — Other Ambulatory Visit (INDEPENDENT_AMBULATORY_CARE_PROVIDER_SITE_OTHER): Payer: Medicare Other

## 2012-11-02 ENCOUNTER — Encounter: Payer: Self-pay | Admitting: Internal Medicine

## 2012-11-02 ENCOUNTER — Ambulatory Visit (INDEPENDENT_AMBULATORY_CARE_PROVIDER_SITE_OTHER): Payer: Medicare Other | Admitting: Internal Medicine

## 2012-11-02 VITALS — BP 132/90 | HR 75 | Temp 97.8°F | Ht 74.0 in | Wt 306.0 lb

## 2012-11-02 DIAGNOSIS — I10 Essential (primary) hypertension: Secondary | ICD-10-CM

## 2012-11-02 DIAGNOSIS — I359 Nonrheumatic aortic valve disorder, unspecified: Secondary | ICD-10-CM | POA: Diagnosis not present

## 2012-11-02 DIAGNOSIS — E785 Hyperlipidemia, unspecified: Secondary | ICD-10-CM

## 2012-11-02 DIAGNOSIS — E119 Type 2 diabetes mellitus without complications: Secondary | ICD-10-CM

## 2012-11-02 LAB — CBC WITH DIFFERENTIAL/PLATELET
Basophils Relative: 0.5 % (ref 0.0–3.0)
Eosinophils Relative: 6.1 % — ABNORMAL HIGH (ref 0.0–5.0)
Hemoglobin: 14.3 g/dL (ref 13.0–17.0)
MCV: 83.7 fl (ref 78.0–100.0)
Monocytes Absolute: 0.6 10*3/uL (ref 0.1–1.0)
Neutro Abs: 3 10*3/uL (ref 1.4–7.7)
Neutrophils Relative %: 53.2 % (ref 43.0–77.0)
RBC: 5.05 Mil/uL (ref 4.22–5.81)
WBC: 5.7 10*3/uL (ref 4.5–10.5)

## 2012-11-02 LAB — LIPID PANEL
Cholesterol: 130 mg/dL (ref 0–200)
HDL: 32.5 mg/dL — ABNORMAL LOW (ref >39.00)
LDL Cholesterol: 70 mg/dL (ref 0–99)
Total CHOL/HDL Ratio: 4
Triglycerides: 138 mg/dL (ref 0.0–149.0)
VLDL: 27.6 mg/dL (ref 0.0–40.0)

## 2012-11-02 LAB — BASIC METABOLIC PANEL
BUN: 14 mg/dL (ref 6–23)
Calcium: 9.6 mg/dL (ref 8.4–10.5)
Chloride: 106 mEq/L (ref 96–112)
Creatinine, Ser: 1.1 mg/dL (ref 0.4–1.5)
GFR: 91.03 mL/min (ref >60.00)

## 2012-11-02 LAB — HEPATIC FUNCTION PANEL
ALT: 13 U/L (ref 0–53)
Bilirubin, Direct: 0.2 mg/dL (ref 0.0–0.3)
Total Bilirubin: 1.2 mg/dL (ref 0.3–1.2)

## 2012-11-02 NOTE — Patient Instructions (Signed)
Please continue all other medications as before Please have the pharmacy call with any other refills you may need. Please go to the LAB in the Basement (turn left off the elevator) for the tests to be done today You will be contacted by phone if any changes need to be made immediately.  Otherwise, you will receive a letter about your results with an explanation, but please check with MyChart first.  Thank you for enrolling in MyChart. Please follow the instructions below to securely access your online medical record. MyChart allows you to send messages to your doctor, view your test results, renew your prescriptions, schedule appointments, and more.  You will be contacted regarding the referral for: echocardiogram, and Dr Jens Som  Please return in 6 months, or sooner if needed

## 2012-11-02 NOTE — Progress Notes (Addendum)
Subjective:    Patient ID: [name], male    DOB: 1966/01/08, 47 y.o.   MRN: [id number]  HPI Here to f/u; overall doing ok,  Pt denies chest pain, increased sob or doe, wheezing, orthopnea, PND, increased LE swelling, palpitations, dizziness or syncope.  Pt denies polydipsia, polyuria, or low sugar symptoms such as weakness or confusion improved with po intake.  Pt denies new neurological symptoms such as new headache, or facial or extremity weakness or numbness.   Pt states overall good compliance with meds, has been trying to follow lower cholesterol, diabetic diet, with wt overall stable,  but little exercise however. Due for echo and card f/u as well Past Medical History  Diagnosis Date  . Diabetes mellitus   . Hypertension   . Unspecified vitamin D deficiency 08/07/2008  . HYPERLIPIDEMIA 08/20/2007  . CAD 05/21/2007  . Aortic valve disorders 09/30/2009  . Gross hematuria 08/07/2008  . TRANSIENT ISCHEMIC ATTACK, HX OF 12/26/2007  . NEPHROLITHIASIS, HX OF 05/21/2007  . Atrial fibrillation 07/27/2010   Past Surgical History  Procedure Laterality Date  . Appendectomy  1998  . Coronary artery bypass graft  2000    reports that he has never smoked. He does not have any smokeless tobacco history on file. He reports that he does not drink alcohol or use illicit drugs. family history includes Coronary artery disease (age of onset: 75) in his other; Diabetes in his brother and mother; and Hypertension in his mother. Allergies  Allergen Reactions  . Insulin Glargine     edema   Current Outpatient Prescriptions on File Prior to Visit  Medication Sig Dispense Refill  . amLODipine (NORVASC) 10 MG tablet TAKE 1 TABLET BY MOUTH ONCE DAILY FOR HIGH BLOOD PRESSURE  30 tablet  5  . aspirin 81 MG EC tablet Take 81 mg by mouth daily.        . Cholecalciferol (VITAMIN D3) 1000 UNITS CAPS Take by mouth daily.        . cloNIDine (CATAPRES) 0.1 MG tablet take 1 tablet by mouth twice a day  60 tablet   11  . furosemide (LASIX) 40 MG tablet take 1 tablet by mouth once daily  30 tablet  11  . Insulin Syringe-Needle U-100 (BD INSULIN SYRINGE ULTRAFINE) 31G X 5/16" 1 ML MISC Use as directed for blood sugar       . KLOR-CON M20 20 MEQ tablet TAKE 2 TABLETS BY MOUTH ONCE DAILY  30 each  5  . losartan (COZAAR) 100 MG tablet take 1 tablet by mouth once daily  30 tablet  11  . metFORMIN (GLUCOPHAGE) 1000 MG tablet take 1 tablet by mouth twice a day with food  60 tablet  10  . metoprolol (TOPROL-XL) 200 MG 24 hr tablet Take 200 mg by mouth daily.        . pravastatin (PRAVACHOL) 40 MG tablet TAKE 1 TABLET BY MOUTH ONCE DAILY  30 tablet  10  . warfarin (COUMADIN) 10 MG tablet Take as directed by coumadin clinic  30 tablet  3  . glimepiride (AMARYL) 1 MG tablet Take 1 tablet (1 mg total) by mouth daily before breakfast.  90 tablet  3   No current facility-administered medications on file prior to visit.   Review of Systems  Constitutional: Negative for unexpected weight change, or unusual diaphoresis  HENT: Negative for tinnitus.   Eyes: Negative for photophobia and visual disturbance.  Respiratory: Negative for choking and stridor.   Gastrointestinal: Negative  for vomiting and blood in stool.  Genitourinary: Negative for hematuria and decreased urine volume.  Musculoskeletal: Negative for acute joint swelling Skin: Negative for color change and wound.  Neurological: Negative for tremors and numbness other than noted  Psychiatric/Behavioral: Negative for decreased concentration or  hyperactivity.       Objective:   Physical Exam BP 132/90  Pulse 75  Temp(Src) 97.8 F (36.6 C) (Oral)  Ht 6\' 2"  (1.88 m)  Wt 306 lb (138.801 kg)  BMI 39.27 kg/m2  SpO2 97% VS noted,  Constitutional: Pt appears well-developed and well-nourished.  HENT: Head: NCAT.  Right Ear: External ear normal.  Left Ear: External ear normal.  Eyes: Conjunctivae and EOM are normal. Pupils are equal, round, and reactive to  light.  Neck: Normal range of motion. Neck supple.  Cardiovascular: Normal rate and regular rhythm.  with click Pulmonary/Chest: Effort normal and breath sounds normal.  Abd:  Soft, NT, non-distended, + BS Neurological: Pt is alert. Not confused  Skin: Skin is warm. No erythema.  Psychiatric: Pt behavior is normal. Thought content normal.     Assessment & Plan:  Quality Measures addressed:  Diabetes Blood Pressure < 140/90: pt declines further medication change at this time

## 2012-11-03 NOTE — Assessment & Plan Note (Signed)
For echo and refer back to card as he is due, o/w stable

## 2012-11-03 NOTE — Assessment & Plan Note (Signed)
stable overall by history and exam, recent data reviewed with pt, and pt to continue medical treatment as before,  to f/u any worsening symptoms or concerns Lab Results  Component Value Date   HGBA1C 6.4 11/02/2012    

## 2012-11-03 NOTE — Assessment & Plan Note (Signed)
stable overall by history and exam, recent data reviewed with pt, and pt to continue medical treatment as before,  to f/u any worsening symptoms or concerns BP Readings from Last 3 Encounters:  11/02/12 132/90  05/04/12 112/70  04/10/12 116/84

## 2012-11-03 NOTE — Assessment & Plan Note (Signed)
stable overall by history and exam, recent data reviewed with pt, and pt to continue medical treatment as before,  to f/u any worsening symptoms or concerns Lab Results  Component Value Date   LDLCALC 70 11/02/2012

## 2012-11-08 ENCOUNTER — Ambulatory Visit (INDEPENDENT_AMBULATORY_CARE_PROVIDER_SITE_OTHER): Payer: Medicare Other | Admitting: *Deleted

## 2012-11-08 DIAGNOSIS — I4891 Unspecified atrial fibrillation: Secondary | ICD-10-CM

## 2012-11-08 DIAGNOSIS — I359 Nonrheumatic aortic valve disorder, unspecified: Secondary | ICD-10-CM

## 2012-11-08 DIAGNOSIS — Z8679 Personal history of other diseases of the circulatory system: Secondary | ICD-10-CM | POA: Diagnosis not present

## 2012-11-08 DIAGNOSIS — G459 Transient cerebral ischemic attack, unspecified: Secondary | ICD-10-CM

## 2012-11-08 LAB — POCT INR: INR: 1.8

## 2012-11-13 ENCOUNTER — Other Ambulatory Visit (HOSPITAL_COMMUNITY): Payer: Medicare Other

## 2012-11-29 ENCOUNTER — Ambulatory Visit (INDEPENDENT_AMBULATORY_CARE_PROVIDER_SITE_OTHER): Payer: Medicare Other | Admitting: *Deleted

## 2012-11-29 DIAGNOSIS — I4891 Unspecified atrial fibrillation: Secondary | ICD-10-CM | POA: Diagnosis not present

## 2012-11-29 DIAGNOSIS — I359 Nonrheumatic aortic valve disorder, unspecified: Secondary | ICD-10-CM | POA: Diagnosis not present

## 2012-11-29 DIAGNOSIS — G459 Transient cerebral ischemic attack, unspecified: Secondary | ICD-10-CM | POA: Diagnosis not present

## 2012-11-29 DIAGNOSIS — Z8679 Personal history of other diseases of the circulatory system: Secondary | ICD-10-CM | POA: Diagnosis not present

## 2012-12-20 ENCOUNTER — Telehealth: Payer: Self-pay | Admitting: Internal Medicine

## 2012-12-20 NOTE — Telephone Encounter (Signed)
Pt called stated that he never got the test result that was done on 11/02/12. Pt stated that he can not get into my chart as this time. Please call pt with the result.

## 2012-12-20 NOTE — Telephone Encounter (Signed)
Called left msg. To call back 

## 2012-12-20 NOTE — Telephone Encounter (Signed)
Patient informed of lab results from June

## 2012-12-24 ENCOUNTER — Other Ambulatory Visit: Payer: Self-pay | Admitting: Internal Medicine

## 2012-12-27 ENCOUNTER — Ambulatory Visit (INDEPENDENT_AMBULATORY_CARE_PROVIDER_SITE_OTHER): Payer: Medicare Other

## 2012-12-27 DIAGNOSIS — I4891 Unspecified atrial fibrillation: Secondary | ICD-10-CM

## 2012-12-27 DIAGNOSIS — G459 Transient cerebral ischemic attack, unspecified: Secondary | ICD-10-CM

## 2012-12-27 DIAGNOSIS — Z8679 Personal history of other diseases of the circulatory system: Secondary | ICD-10-CM | POA: Diagnosis not present

## 2012-12-27 DIAGNOSIS — I359 Nonrheumatic aortic valve disorder, unspecified: Secondary | ICD-10-CM

## 2012-12-27 LAB — POCT INR: INR: 1.6

## 2013-01-22 ENCOUNTER — Encounter: Payer: Medicare Other | Admitting: Cardiology

## 2013-01-22 NOTE — Progress Notes (Signed)
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 Had Myoview in 2011. Walked for 9:00 on [place]. No CP or ECG changes. Images with EF 39% Prominent inferior scar with very mild reversibility. Cardiac CT was performed in February of 2012. There was no coronary disease with only mild plaque in the proximal right coronary artery. Echocardiogram in March of 2013 showed normal LV function, a mechanical aortic valve prosthesis with trace aortic insufficiency, moderate left atrial enlargement, moderate right atrial enlargement and mild right ventricular enlargement. There was mild mitral regurgitation. The mean gradient across the aortic valve was 33 mm of mercury. Possible valve mismatch. Note mean gradient in May of 2011 was 26 mm of mercury and in 2007 25 mm of mercury. Since he was last seen, the patient denies any dyspnea on exertion, orthopnea, PND, pedal edema, palpitations, syncope or chest pain.   Current Outpatient Prescriptions  Medication Sig Dispense Refill  . amLODipine (NORVASC) 10 MG tablet TAKE 1 TABLET BY MOUTH ONCE DAILY FOR HIGH BLOOD PRESSURE  30 tablet  5  . aspirin 81 MG EC tablet Take 81 mg by mouth daily.        . Cholecalciferol (VITAMIN D3) 1000 UNITS CAPS Take by mouth daily.        . cloNIDine (CATAPRES) 0.1 MG tablet take 1 tablet by mouth twice a day  60 tablet  0  . furosemide (LASIX) 40 MG tablet take 1 tablet by mouth once daily  30 tablet  5  . glimepiride (AMARYL) 1 MG tablet take 1 tablet by mouth once daily before BREAKFAST  90 tablet  1  . Insulin Syringe-Needle U-100 (BD INSULIN SYRINGE ULTRAFINE) 31G X 5/16" 1 ML MISC Use as directed for blood sugar       . KLOR-CON M20 20 MEQ tablet TAKE 2 TABLETS BY MOUTH ONCE DAILY  30 each  5  . losartan (COZAAR) 100 MG tablet take 1 tablet by mouth once daily  30 tablet  6  . metFORMIN (GLUCOPHAGE) 1000 MG  tablet take 1 tablet by mouth twice a day with food  60 tablet  10  . metoprolol (TOPROL-XL) 200 MG 24 hr tablet Take 200 mg by mouth daily.        . pravastatin (PRAVACHOL) 40 MG tablet TAKE 1 TABLET BY MOUTH ONCE DAILY  30 tablet  10  . warfarin (COUMADIN) 10 MG tablet Take as directed by coumadin clinic  30 tablet  3   No current facility-administered medications for this visit.     Past Medical History  Diagnosis Date  . Diabetes mellitus   . Hypertension   . Unspecified vitamin D deficiency 08/07/2008  . HYPERLIPIDEMIA 08/20/2007  . CAD 05/21/2007  . Aortic valve disorders 09/30/2009  . Gross hematuria 08/07/2008  . TRANSIENT ISCHEMIC ATTACK, HX OF 12/26/2007  . NEPHROLITHIASIS, HX OF 05/21/2007  . Atrial fibrillation 07/27/2010    Past Surgical History  Procedure Laterality Date  . Appendectomy  1998  . Coronary artery bypass graft  2000    History   Social History  . Marital Status: Legally Separated    Spouse Name: N/A    Number of Children: 4  . Years of Education: N/A   Occupational History  . DISABLED    Social History Main Topics  . Smoking status: Never Smoker   . Smokeless tobacco: Not on file  . Alcohol  Use: No  . Drug Use: No  . Sexual Activity: Not on file   Other Topics Concern  . Not on file   Social History Narrative  . No narrative on file    ROS: no fevers or chills, productive cough, hemoptysis, dysphasia, odynophagia, melena, hematochezia, dysuria, hematuria, rash, seizure activity, orthopnea, PND, pedal edema, claudication. Remaining systems are negative.  Physical Exam: Well-developed well-nourished in no acute distress.  Skin is warm and dry.  HEENT is normal.  Neck is supple.  Chest is clear to auscultation with normal expansion.  Cardiovascular exam is regular rate and rhythm.  Abdominal exam nontender or distended. No masses palpated. Extremities show no edema. neuro grossly intact  ECG     This encounter was created in  error - please disregard.

## 2013-01-23 ENCOUNTER — Ambulatory Visit (INDEPENDENT_AMBULATORY_CARE_PROVIDER_SITE_OTHER): Payer: Medicare Other | Admitting: *Deleted

## 2013-01-23 DIAGNOSIS — G459 Transient cerebral ischemic attack, unspecified: Secondary | ICD-10-CM | POA: Diagnosis not present

## 2013-01-23 DIAGNOSIS — I359 Nonrheumatic aortic valve disorder, unspecified: Secondary | ICD-10-CM | POA: Diagnosis not present

## 2013-01-23 DIAGNOSIS — Z8679 Personal history of other diseases of the circulatory system: Secondary | ICD-10-CM | POA: Diagnosis not present

## 2013-01-23 DIAGNOSIS — I4891 Unspecified atrial fibrillation: Secondary | ICD-10-CM | POA: Diagnosis not present

## 2013-01-25 ENCOUNTER — Encounter: Payer: Self-pay | Admitting: Cardiology

## 2013-02-06 ENCOUNTER — Other Ambulatory Visit: Payer: Self-pay | Admitting: Internal Medicine

## 2013-02-06 ENCOUNTER — Other Ambulatory Visit: Payer: Self-pay | Admitting: Cardiology

## 2013-02-07 ENCOUNTER — Other Ambulatory Visit: Payer: Self-pay | Admitting: Internal Medicine

## 2013-02-20 ENCOUNTER — Ambulatory Visit (INDEPENDENT_AMBULATORY_CARE_PROVIDER_SITE_OTHER): Payer: Medicare Other | Admitting: *Deleted

## 2013-02-20 DIAGNOSIS — I4891 Unspecified atrial fibrillation: Secondary | ICD-10-CM

## 2013-02-20 DIAGNOSIS — G459 Transient cerebral ischemic attack, unspecified: Secondary | ICD-10-CM

## 2013-02-20 DIAGNOSIS — I359 Nonrheumatic aortic valve disorder, unspecified: Secondary | ICD-10-CM | POA: Diagnosis not present

## 2013-02-20 DIAGNOSIS — Z8679 Personal history of other diseases of the circulatory system: Secondary | ICD-10-CM

## 2013-02-20 LAB — POCT INR: INR: 3.3

## 2013-03-12 ENCOUNTER — Other Ambulatory Visit (HOSPITAL_COMMUNITY): Payer: Self-pay | Admitting: Internal Medicine

## 2013-03-12 ENCOUNTER — Ambulatory Visit (HOSPITAL_COMMUNITY): Payer: Medicare Other | Attending: Cardiology

## 2013-03-12 ENCOUNTER — Ambulatory Visit (INDEPENDENT_AMBULATORY_CARE_PROVIDER_SITE_OTHER): Payer: Medicare Other | Admitting: *Deleted

## 2013-03-12 DIAGNOSIS — Z954 Presence of other heart-valve replacement: Secondary | ICD-10-CM | POA: Insufficient documentation

## 2013-03-12 DIAGNOSIS — I079 Rheumatic tricuspid valve disease, unspecified: Secondary | ICD-10-CM | POA: Insufficient documentation

## 2013-03-12 DIAGNOSIS — I10 Essential (primary) hypertension: Secondary | ICD-10-CM

## 2013-03-12 DIAGNOSIS — I080 Rheumatic disorders of both mitral and aortic valves: Secondary | ICD-10-CM | POA: Diagnosis not present

## 2013-03-12 DIAGNOSIS — I4891 Unspecified atrial fibrillation: Secondary | ICD-10-CM

## 2013-03-12 DIAGNOSIS — Z8673 Personal history of transient ischemic attack (TIA), and cerebral infarction without residual deficits: Secondary | ICD-10-CM | POA: Diagnosis not present

## 2013-03-12 DIAGNOSIS — I359 Nonrheumatic aortic valve disorder, unspecified: Secondary | ICD-10-CM

## 2013-03-12 DIAGNOSIS — I2510 Atherosclerotic heart disease of native coronary artery without angina pectoris: Secondary | ICD-10-CM | POA: Diagnosis not present

## 2013-03-12 DIAGNOSIS — I379 Nonrheumatic pulmonary valve disorder, unspecified: Secondary | ICD-10-CM | POA: Insufficient documentation

## 2013-03-12 DIAGNOSIS — E785 Hyperlipidemia, unspecified: Secondary | ICD-10-CM | POA: Diagnosis not present

## 2013-03-12 DIAGNOSIS — E119 Type 2 diabetes mellitus without complications: Secondary | ICD-10-CM | POA: Insufficient documentation

## 2013-03-12 DIAGNOSIS — G459 Transient cerebral ischemic attack, unspecified: Secondary | ICD-10-CM

## 2013-03-12 DIAGNOSIS — Z8679 Personal history of other diseases of the circulatory system: Secondary | ICD-10-CM

## 2013-03-12 LAB — POCT INR: INR: 2

## 2013-03-12 NOTE — Progress Notes (Signed)
Echocardiogram performed.  

## 2013-03-19 ENCOUNTER — Telehealth: Payer: Self-pay | Admitting: Cardiology

## 2013-03-19 NOTE — Telephone Encounter (Signed)
New problem   Patient would like results of Echo. Please call patient

## 2013-03-19 NOTE — Telephone Encounter (Signed)
Spoke with pt, aware of echo results. 

## 2013-03-23 ENCOUNTER — Observation Stay (HOSPITAL_COMMUNITY)
Admission: EM | Admit: 2013-03-23 | Discharge: 2013-03-25 | Disposition: A | Discharge status: Home / Self Care | Payer: Medicare Other | Attending: Internal Medicine | Admitting: Internal Medicine

## 2013-03-23 ENCOUNTER — Other Ambulatory Visit: Payer: Self-pay

## 2013-03-23 ENCOUNTER — Emergency Department (HOSPITAL_COMMUNITY): Payer: Medicare Other

## 2013-03-23 ENCOUNTER — Encounter (HOSPITAL_COMMUNITY): Payer: Self-pay | Admitting: Emergency Medicine

## 2013-03-23 DIAGNOSIS — I200 Unstable angina: Secondary | ICD-10-CM | POA: Insufficient documentation

## 2013-03-23 DIAGNOSIS — R0989 Other specified symptoms and signs involving the circulatory and respiratory systems: Secondary | ICD-10-CM | POA: Diagnosis not present

## 2013-03-23 DIAGNOSIS — Z0000 Encounter for general adult medical examination without abnormal findings: Secondary | ICD-10-CM

## 2013-03-23 DIAGNOSIS — I249 Acute ischemic heart disease, unspecified: Secondary | ICD-10-CM

## 2013-03-23 DIAGNOSIS — Z8679 Personal history of other diseases of the circulatory system: Secondary | ICD-10-CM

## 2013-03-23 DIAGNOSIS — R5381 Other malaise: Secondary | ICD-10-CM

## 2013-03-23 DIAGNOSIS — E119 Type 2 diabetes mellitus without complications: Secondary | ICD-10-CM | POA: Diagnosis not present

## 2013-03-23 DIAGNOSIS — R079 Chest pain, unspecified: Secondary | ICD-10-CM

## 2013-03-23 DIAGNOSIS — Z7901 Long term (current) use of anticoagulants: Secondary | ICD-10-CM | POA: Insufficient documentation

## 2013-03-23 DIAGNOSIS — I4891 Unspecified atrial fibrillation: Secondary | ICD-10-CM

## 2013-03-23 DIAGNOSIS — Z954 Presence of other heart-valve replacement: Secondary | ICD-10-CM | POA: Insufficient documentation

## 2013-03-23 DIAGNOSIS — I359 Nonrheumatic aortic valve disorder, unspecified: Secondary | ICD-10-CM

## 2013-03-23 DIAGNOSIS — E785 Hyperlipidemia, unspecified: Secondary | ICD-10-CM | POA: Diagnosis not present

## 2013-03-23 DIAGNOSIS — Z87442 Personal history of urinary calculi: Secondary | ICD-10-CM

## 2013-03-23 DIAGNOSIS — R072 Precordial pain: Secondary | ICD-10-CM

## 2013-03-23 DIAGNOSIS — N320 Bladder-neck obstruction: Secondary | ICD-10-CM

## 2013-03-23 DIAGNOSIS — R21 Rash and other nonspecific skin eruption: Secondary | ICD-10-CM

## 2013-03-23 DIAGNOSIS — I10 Essential (primary) hypertension: Secondary | ICD-10-CM

## 2013-03-23 DIAGNOSIS — Z952 Presence of prosthetic heart valve: Secondary | ICD-10-CM

## 2013-03-23 DIAGNOSIS — D696 Thrombocytopenia, unspecified: Secondary | ICD-10-CM | POA: Diagnosis not present

## 2013-03-23 DIAGNOSIS — I2510 Atherosclerotic heart disease of native coronary artery without angina pectoris: Principal | ICD-10-CM

## 2013-03-23 DIAGNOSIS — E559 Vitamin D deficiency, unspecified: Secondary | ICD-10-CM

## 2013-03-23 DIAGNOSIS — Z794 Long term (current) use of insulin: Secondary | ICD-10-CM | POA: Diagnosis not present

## 2013-03-23 DIAGNOSIS — R310 Gross hematuria: Secondary | ICD-10-CM

## 2013-03-23 LAB — CBC
MCH: 29.2 pg (ref 26.0–34.0)
MCHC: 36.7 g/dL — ABNORMAL HIGH (ref 30.0–36.0)
MCV: 79.6 fL (ref 78.0–100.0)
Platelets: 150 10*3/uL (ref 150–400)

## 2013-03-23 LAB — BASIC METABOLIC PANEL
BUN: 15 mg/dL (ref 6–23)
Calcium: 9.3 mg/dL (ref 8.4–10.5)
Creatinine, Ser: 0.99 mg/dL (ref 0.50–1.35)
GFR calc non Af Amer: >90 mL/min (ref >90)
Glucose, Bld: 117 mg/dL — ABNORMAL HIGH (ref 70–99)
Sodium: 139 mEq/L (ref 135–145)

## 2013-03-23 LAB — POCT I-STAT TROPONIN I: Troponin i, poc: 0 ng/mL (ref 0.00–0.08)

## 2013-03-23 IMAGING — CR DG CHEST 2V
2 series · 2 of 2 positions shown · non-contrast
Comparison: 04/09/2012

CLINICAL DATA: Non radiating sharp left chest pain intermittently
over past few hr, history AVR, coronary artery disease,
hypertension, diabetes

EXAM:
CHEST  2 VIEW

[w chest pa]
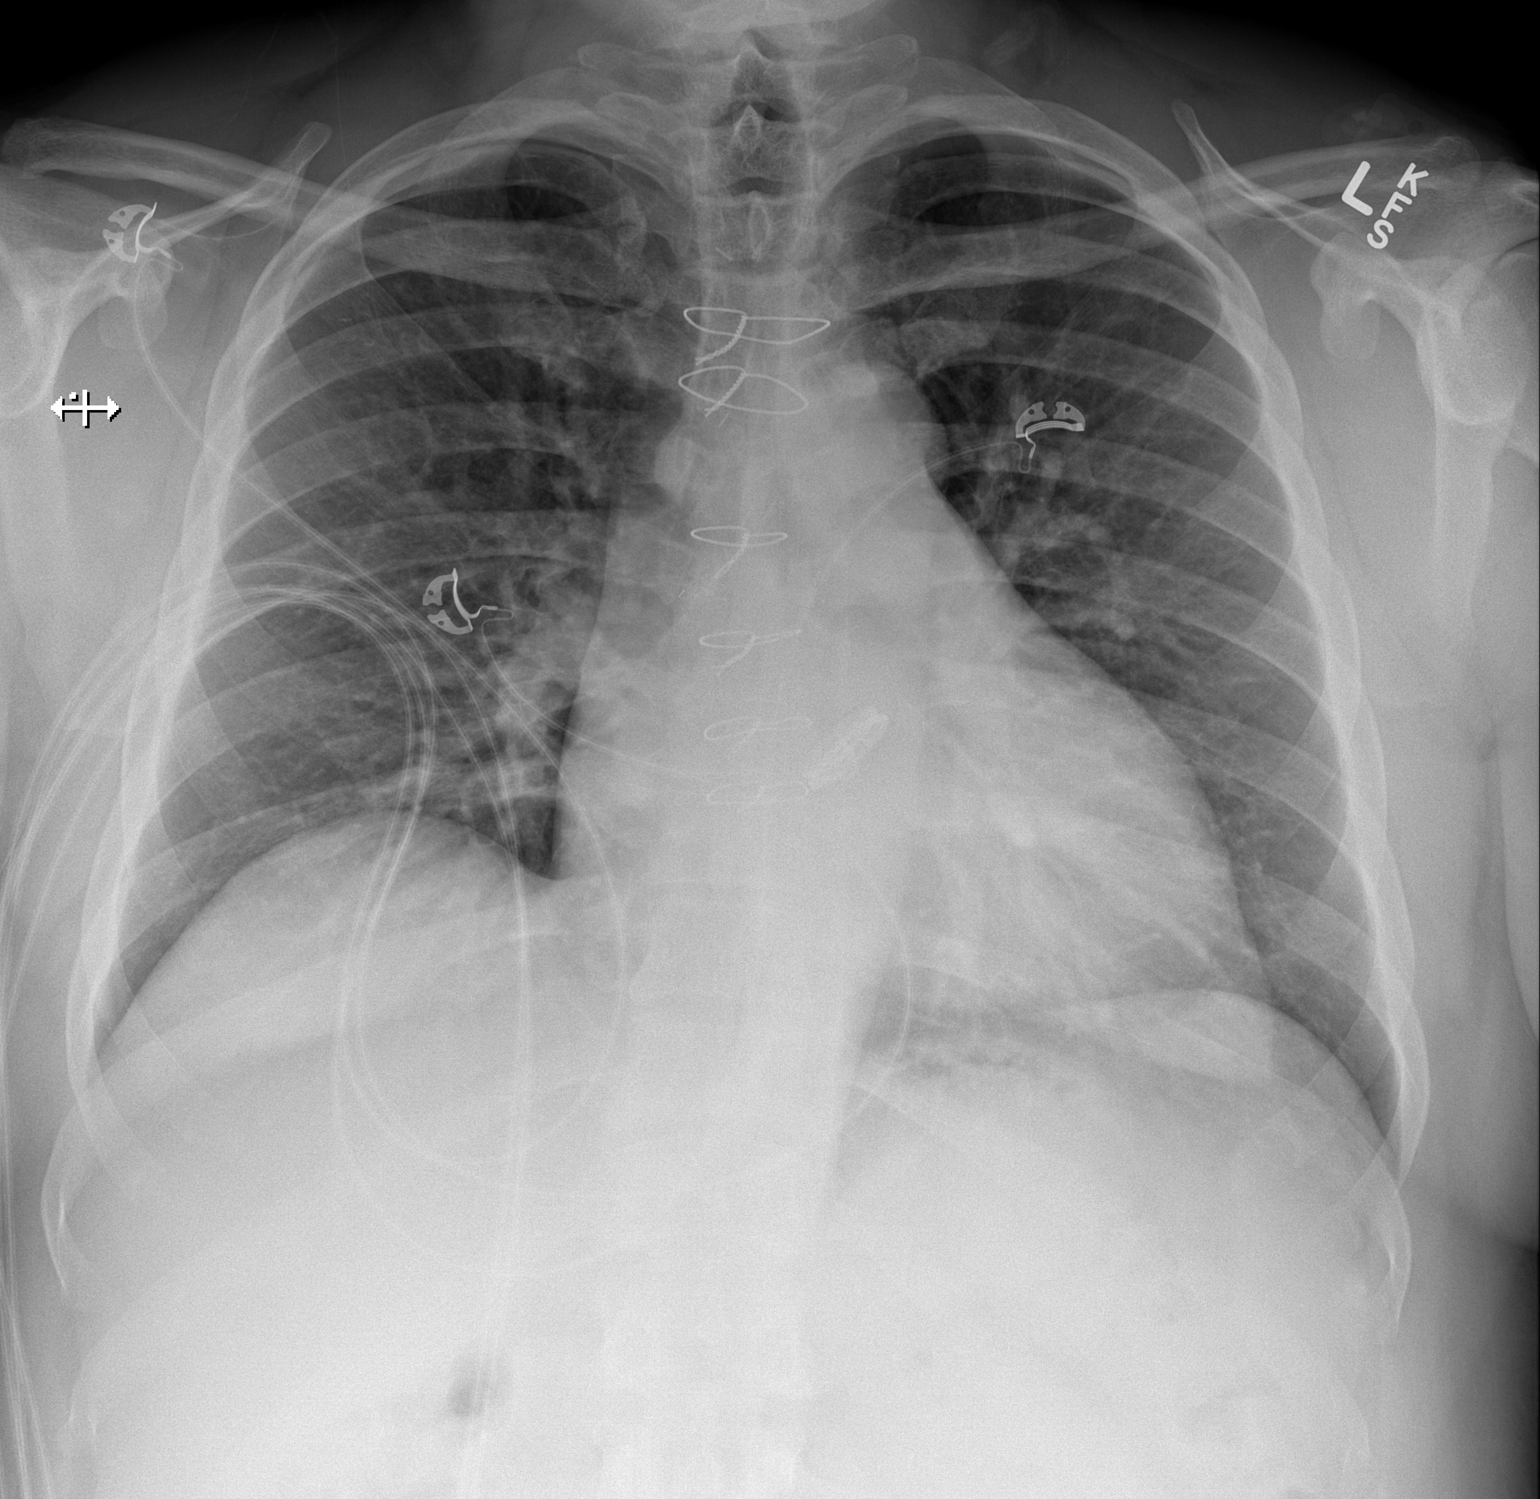

[w chest lat]
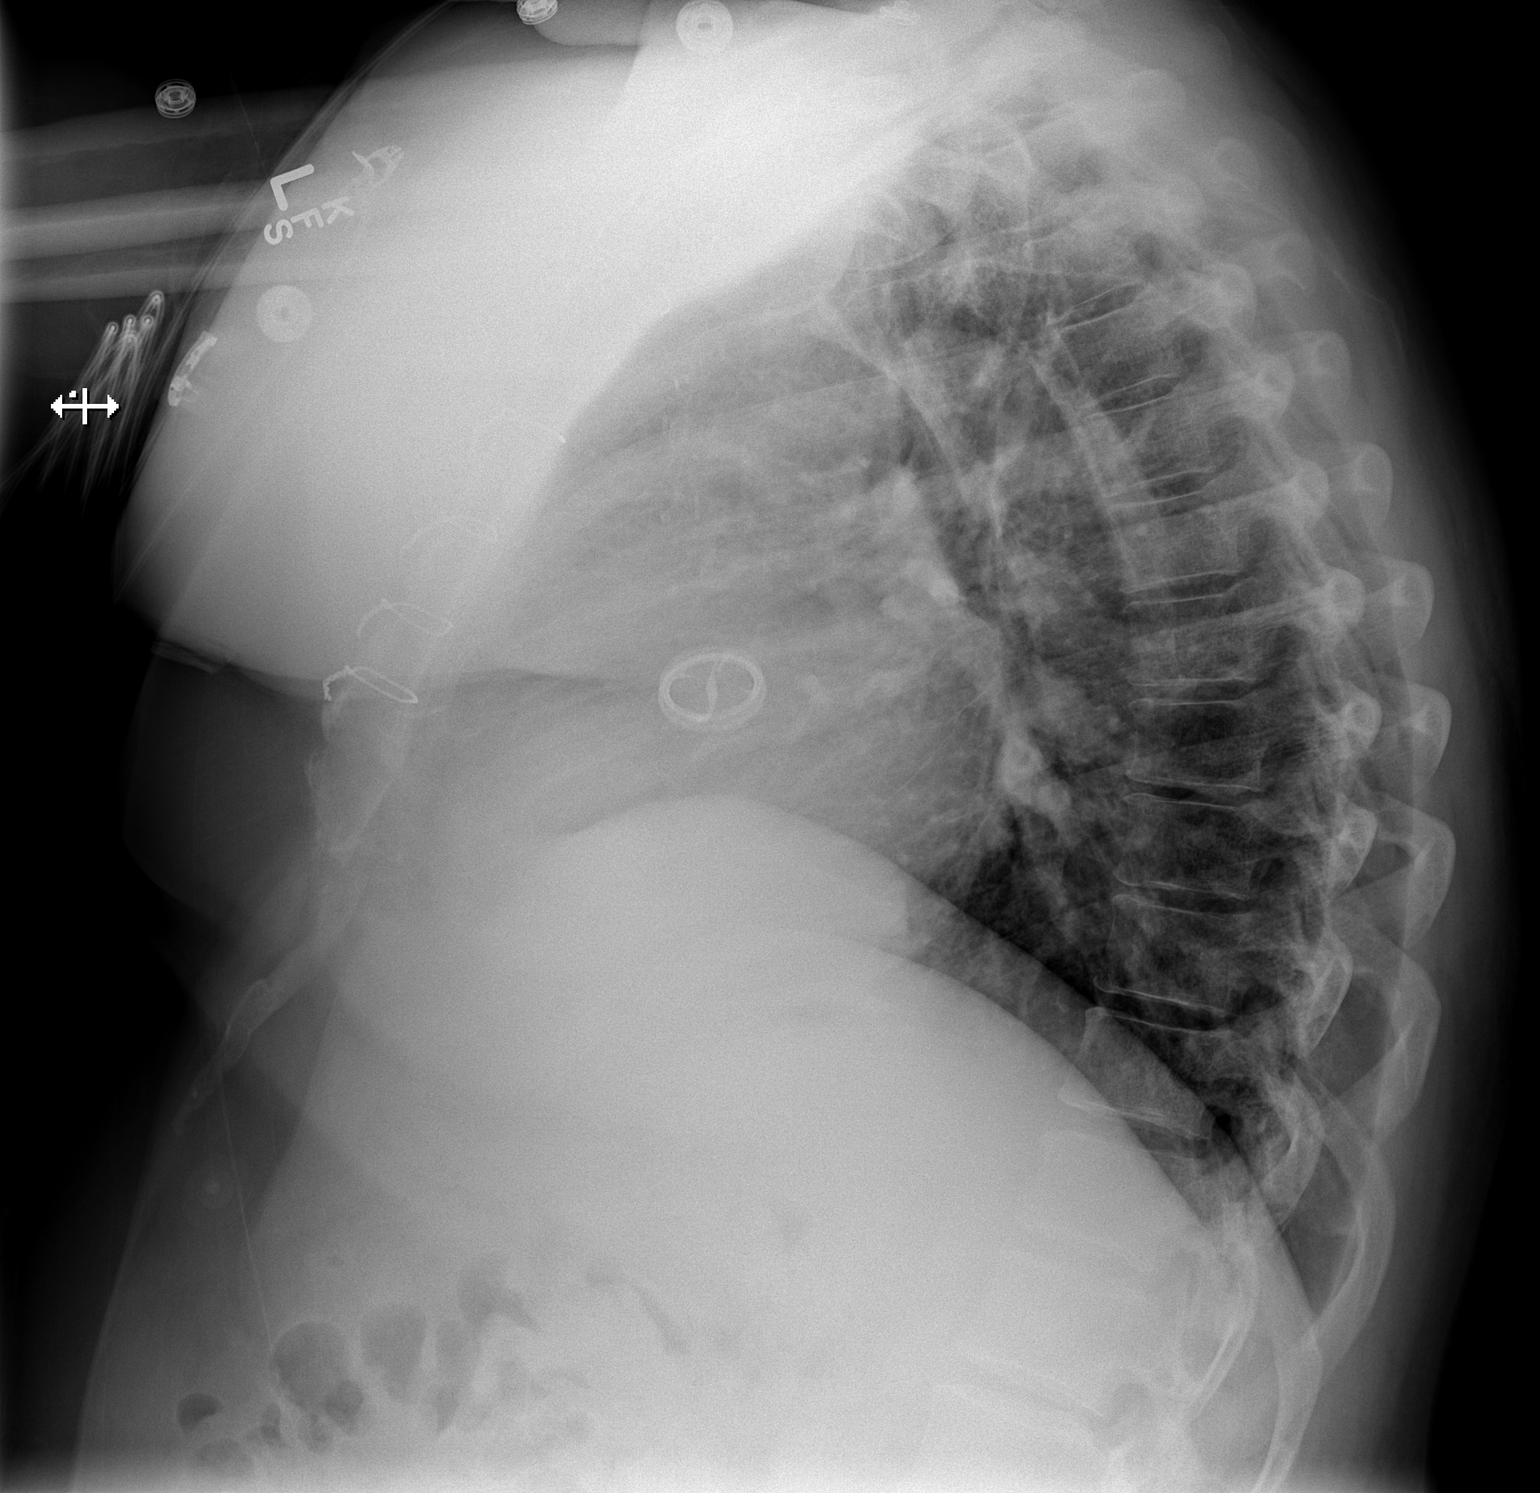

[2 of 2 positions shown; findings below may reference images not displayed]

FINDINGS: Enlargement of cardiac silhouette post median sternotomy and AVR.

Pulmonary vascular congestion.

Mediastinal contours normal.

No gross failure or consolidation.

No pleural effusion or pneumothorax.

Bones unremarkable.
IMPRESSION: Enlargement of cardiac silhouette post AVR with pulmonary vascular
congestion.

No acute abnormalities.

## 2013-03-23 MED ORDER — ONDANSETRON HCL 4 MG/2ML IJ SOLN
4.0000 mg | Freq: Once | INTRAMUSCULAR | Status: AC
  Administered 2013-03-24: 4 mg via INTRAVENOUS
  Filled 2013-03-23: qty 2

## 2013-03-23 MED ORDER — ASPIRIN 81 MG PO CHEW: 324.0000 mg | CHEWABLE_TABLET | Freq: Once | ORAL | Status: DC

## 2013-03-23 MED ORDER — ASPIRIN 81 MG PO CHEW
324.0000 mg | CHEWABLE_TABLET | Freq: Once | ORAL | Status: AC
  Administered 2013-03-23: 324 mg via ORAL
  Filled 2013-03-23: qty 4

## 2013-03-23 MED ORDER — MORPHINE SULFATE 4 MG/ML IJ SOLN
4.0000 mg | Freq: Once | INTRAMUSCULAR | Status: AC
  Administered 2013-03-24: 4 mg via INTRAVENOUS
  Filled 2013-03-23: qty 1

## 2013-03-23 NOTE — ED Notes (Signed)
EKG was delayed in triage.  Was not aware pt was in triage room until notified by RN.  After EKG pt taken directly back too pod a room 6.

## 2013-03-23 NOTE — ED Provider Notes (Signed)
CSN: [id number]     Arrival date & time 03/23/13  2219 History   First MD Initiated Contact with Patient 03/23/13 2306     Chief Complaint  Patient presents with  . Chest Pain   (Consider location/radiation/quality/duration/timing/severity/associated sxs/prior Treatment) HPI This patient is a pleasant 47 year old man with a complicated past medical history which is notable for history of aortic valve replacement, history of atrial fibrillation, repair of a thoracic aortic aneurysm, hyperlipidemia, hypertension, diabetes mellitus and questionable history of coronary artery disease. The patient presents with left-sided chest pain. His pain began 2 hours prior to his arrival he was seated, resting and watching a football he television. His pain was 10 over 10 at its worst. He has some associated lightheadedness. He denies nausea, vomiting and diaphoresis. Nothing seems to improve or excacerbate his sx.   Patient is currently experiencing 9/10 pain. He says that he cannot recall experiencing similar pain in the past. He denies any GI symptoms. He does not feel short of breath. Denies cough and fever. The patient had aspirin after being triaged in the emergency department tonight.  Past Medical History  Diagnosis Date  . Diabetes mellitus   . Hypertension   . Unspecified vitamin D deficiency 08/07/2008  . HYPERLIPIDEMIA 08/20/2007  . CAD 05/21/2007  . Aortic valve disorders 09/30/2009  . Gross hematuria 08/07/2008  . TRANSIENT ISCHEMIC ATTACK, HX OF 12/26/2007  . NEPHROLITHIASIS, HX OF 05/21/2007  . Atrial fibrillation 07/27/2010   Past Surgical History  Procedure Laterality Date  . Appendectomy  1998  . Coronary artery bypass graft  2000   Family History  Problem Relation Age of Onset  . Diabetes Mother   . Hypertension Mother   . Diabetes Brother   . Coronary artery disease Other 13    male 1st degree relative, CABG in his 45's (father)   History  Substance Use Topics  . Smoking status:  Never Smoker   . Smokeless tobacco: Not on file  . Alcohol Use: No    Review of Systems Gen: no weight loss, fevers, chills, night sweats Eyes: no discharge or drainage, no occular pain or visual changes Nose: no epistaxis or rhinorrhea Mouth: no dental pain, no sore throat Neck: no neck pain Lungs: no SOB, cough, wheezing CV: As per history of present illness, otherwise negative Abd: no abdominal pain, nausea, vomiting GU: no dysuria or gross hematuria MSK: no myalgias or arthralgias Neuro: no headache, no focal neurologic deficits Skin: no rash Psyche: negative.  Allergies  Insulin glargine  Home Medications   Current Outpatient Rx  Name  Route  Sig  Dispense  Refill  . amLODipine (NORVASC) 10 MG tablet      take 1 tablet by mouth once daily for high blood pressure   30 tablet   5   . aspirin 81 MG EC tablet   Oral   Take 81 mg by mouth daily.           . Cholecalciferol (VITAMIN D3) 1000 UNITS CAPS   Oral   Take 1,000 Units by mouth daily.          . cloNIDine (CATAPRES) 0.1 MG tablet      take 1 tablet by mouth twice a day   60 tablet   0     PATIENT NEEDS TO CALL OUR OFFICE TO SCHEDULE A FOL ...   . furosemide (LASIX) 40 MG tablet      take 1 tablet by mouth once daily  30 tablet   5   . glimepiride (AMARYL) 1 MG tablet      take 1 tablet by mouth once daily before BREAKFAST   90 tablet   1   . losartan (COZAAR) 100 MG tablet      take 1 tablet by mouth once daily   30 tablet   6   . metFORMIN (GLUCOPHAGE) 1000 MG tablet      take 1 tablet by mouth twice a day with food   60 tablet   11   . metoprolol (TOPROL-XL) 200 MG 24 hr tablet   Oral   Take 200 mg by mouth daily.           . potassium chloride SA (K-DUR,KLOR-CON) 20 MEQ tablet   Oral   Take 20 mEq by mouth 2 (two) times daily.         . pravastatin (PRAVACHOL) 40 MG tablet   Oral   Take 40 mg by mouth daily.         [name] warfarin (COUMADIN) 5 MG tablet   Oral    Take 5-10 mg by mouth daily. Thursday and Sunday take 10mg  (2 tablets) and 5mg  (1 tablet) all other days.          BP 161/90  Pulse 67  Temp(Src) 97.6 F (36.4 C) (Oral)  Resp 18  SpO2 98% Physical Exam Gen: well developed and well nourished appearing Head: NCAT Eyes: PERL, EOMI Nose: no epistaixis or rhinorrhea Mouth/throat: mucosa is moist and pink Neck: supple, no stridor Lungs: CTA B, no wheezing, rhonchi or rales Cardiovascular: Regular rate and rhythm, 2/6 holosystolic murmur Abd: soft, obese,  notender, nondistended Back: no ttp, no cva ttp Skin: no rashese, wnl Neuro: CN ii-xii grossly intact, no focal deficits Psyche; normal affect,  calm and cooperative.   ED Course  Procedures (including critical care time) Labs Review  Results for orders placed during the hospital encounter of 03/23/13 (from the past 24 hour(s))  CBC     Status: Abnormal   Collection Time    03/23/13 10:36 PM      Result Value Range   WBC 6.0  4.0 - 10.5 K/uL   RBC 4.76  4.22 - 5.81 MIL/uL   Hemoglobin 13.9  13.0 - 17.0 g/dL   HCT 16.1 (*) 09.6 - 04.5 %   MCV 79.6  78.0 - 100.0 fL   MCH 29.2  26.0 - 34.0 pg   MCHC 36.7 (*) 30.0 - 36.0 g/dL   RDW 40.9  81.1 - 91.4 %   Platelets 150  150 - 400 K/uL  BASIC METABOLIC PANEL     Status: Abnormal   Collection Time    03/23/13 10:36 PM      Result Value Range   Sodium 139  135 - 145 mEq/L   Potassium 3.8  3.5 - 5.1 mEq/L   Chloride 104  96 - 112 mEq/L   CO2 26  19 - 32 mEq/L   Glucose, Bld 117 (*) 70 - 99 mg/dL   BUN 15  6 - 23 mg/dL   Creatinine, Ser 7.82  0.50 - 1.35 mg/dL   Calcium 9.3  8.4 - 95.6 mg/dL   GFR calc non Af Amer >90  >90 mL/min   GFR calc Af Amer >90  >90 mL/min  POCT I-STAT TROPONIN I     Status: None   Collection Time    03/23/13 10:40 PM      Result Value Range  Troponin i, poc 0.00  0.00 - 0.08 ng/mL   Comment 3            EKG: NSR, pulse 67, normal intervals, left axis deviation, diffuse flattening of T  waves, no ST elevation.   CXR: normal cardiac silloute, normal appearing mediastinum, no infiltrates, no acute process identified.   EXAM:  CT ANGIOGRAPHY CHEST WITH CONTRAST  TECHNIQUE:  Multidetector CT imaging of the chest was performed using the  standard protocol during bolus administration of intravenous  contrast. Multiplanar CT image reconstructions including MIPs were  obtained to evaluate the vascular anatomy.  CONTRAST: 100 cc Omnipaque 350 IV  COMPARISON: 05/26/2010  FINDINGS:  Thoracic aortic dissection is identified at the arch extending the  into the distal ascending thoracic aorta.  This appears to been present since an earlier exam of 12/31/2009  though is better opacified on the current study.  The aorta is mildly dilated at this level and shows evidence of  surgical repair; this likely represent a Type A dissection which has  undergone prior surgical repair.  Postsurgical changes of median sternotomy and aortic valve  replacement noted.  Distal thoracic aortic arch and descending thoracic aorta show no  evidence of dissection.  Pulmonary arteries patent.  No evidence of pulmonary embolism.  Scattered respiratory motion artifacts at lung bases.  Visualized portion of upper abdomen significant only for probable  cholelithiasis.  Lungs clear.  No pleural effusion or pneumothorax.  No acute osseous findings.  Review of the MIP images confirms the above findings.  IMPRESSION:  No evidence of pulmonary embolism.  Postsurgical changes of aortic valve replacement and what is likely  prior repair of aType A aortic dissection, though an intimal flap  with fenestration is again seen at the mildly dilated proximal  aortic arch.  Appearance is grossly unchanged since 12/31/2009.  Electronically Signed  By: Ulyses Southward M.D.  On: 03/24/2013 01:26   MDM   DDX: ACS, pneumothorax, pneumonia, pericardial or pleural effusion, gastritis, GERD/PUD, musculoskeletal pain.  TAD.    0245: Patient with an initial good response to MS with resolution of pain. However, on re-examination, he is having recurrent chest pain. We will repeat troponin and EKG.  The patient is already anticoagulated - albeit incompletely - with Coumadin and is BB. He has had ASA. We will place NTG paste and re-evaluate for response. Case discussed with [facility] cardiologist on call who requests that the patient be admitted to the hospitalist service. Case discussed with Dr. Lovell Sheehan who has accepted the patient to the SDU.   ED results, Diagnosis and plan discussed with patient.    Brandt Loosen, MD 03/24/13 [phone]

## 2013-03-23 NOTE — ED Notes (Signed)
Pt states that pain is intermittent and that it last 2-3 minutes.

## 2013-03-23 NOTE — ED Notes (Signed)
Pt c/o non radiating left sided sharp CP. Denies N/V, diaphoresis, dizziness, lightheadedness or SOB. Pain exacerbated by lying down. Pain relived by nothing. Last ASA is 81mg  this morning.

## 2013-03-24 ENCOUNTER — Emergency Department (HOSPITAL_COMMUNITY): Payer: Medicare Other

## 2013-03-24 ENCOUNTER — Encounter (HOSPITAL_COMMUNITY): Payer: Self-pay | Admitting: Internal Medicine

## 2013-03-24 DIAGNOSIS — R072 Precordial pain: Secondary | ICD-10-CM | POA: Diagnosis not present

## 2013-03-24 DIAGNOSIS — I2510 Atherosclerotic heart disease of native coronary artery without angina pectoris: Secondary | ICD-10-CM

## 2013-03-24 DIAGNOSIS — I359 Nonrheumatic aortic valve disorder, unspecified: Secondary | ICD-10-CM | POA: Diagnosis not present

## 2013-03-24 DIAGNOSIS — R079 Chest pain, unspecified: Secondary | ICD-10-CM

## 2013-03-24 DIAGNOSIS — Z9689 Presence of other specified functional implants: Secondary | ICD-10-CM | POA: Insufficient documentation

## 2013-03-24 DIAGNOSIS — I200 Unstable angina: Secondary | ICD-10-CM | POA: Diagnosis not present

## 2013-03-24 DIAGNOSIS — I10 Essential (primary) hypertension: Secondary | ICD-10-CM

## 2013-03-24 DIAGNOSIS — E785 Hyperlipidemia, unspecified: Secondary | ICD-10-CM

## 2013-03-24 DIAGNOSIS — E119 Type 2 diabetes mellitus without complications: Secondary | ICD-10-CM

## 2013-03-24 LAB — TROPONIN I
Troponin I: <0.3 ng/mL (ref <0.30)
Troponin I: <0.3 ng/mL (ref <0.30)

## 2013-03-24 LAB — TYPE AND SCREEN
ABO/RH(D): A POS
Antibody Screen: NEGATIVE

## 2013-03-24 LAB — BASIC METABOLIC PANEL
BUN: 11 mg/dL (ref 6–23)
CO2: 28 mEq/L (ref 19–32)
Calcium: 8.8 mg/dL (ref 8.4–10.5)
Creatinine, Ser: 0.95 mg/dL (ref 0.50–1.35)
GFR calc non Af Amer: >90 mL/min (ref >90)
Glucose, Bld: 109 mg/dL — ABNORMAL HIGH (ref 70–99)
Sodium: 136 mEq/L (ref 135–145)

## 2013-03-24 LAB — GLUCOSE, CAPILLARY: Glucose-Capillary: 104 mg/dL — ABNORMAL HIGH (ref 70–99)

## 2013-03-24 LAB — POCT I-STAT TROPONIN I: Troponin i, poc: 0 ng/mL (ref 0.00–0.08)

## 2013-03-24 LAB — HEMOGLOBIN A1C
Hgb A1c MFr Bld: 6.8 % — ABNORMAL HIGH (ref <5.7)
Mean Plasma Glucose: 148 mg/dL — ABNORMAL HIGH (ref <117)

## 2013-03-24 LAB — CBC
MCH: 28.5 pg (ref 26.0–34.0)
MCHC: 36.5 g/dL — ABNORMAL HIGH (ref 30.0–36.0)
MCV: 78.2 fL (ref 78.0–100.0)
Platelets: 141 10*3/uL — ABNORMAL LOW (ref 150–400)
RDW: 15 % (ref 11.5–15.5)
WBC: 4.6 10*3/uL (ref 4.0–10.5)

## 2013-03-24 LAB — MRSA PCR SCREENING: MRSA by PCR: NEGATIVE

## 2013-03-24 LAB — PROTIME-INR: Prothrombin Time: 17.2 seconds — ABNORMAL HIGH (ref 11.6–15.2)

## 2013-03-24 LAB — ABO/RH: ABO/RH(D): A POS

## 2013-03-24 LAB — HEPARIN LEVEL (UNFRACTIONATED): Heparin Unfractionated: 0.28 IU/mL — ABNORMAL LOW (ref 0.30–0.70)

## 2013-03-24 IMAGING — CT CT ANGIOGRAPHY CHEST
2 of 9 series · 17 of 46 positions shown · IV contrast (omnipaque)
Comparison: 05/26/2010

CLINICAL DATA: Chest pain, history of repaired thoracic aortic
dissection

EXAM:
CT ANGIOGRAPHY CHEST WITH CONTRAST
TECHNIQUE: Multidetector CT imaging of the chest was performed using the
standard protocol during bolus administration of intravenous
contrast. Multiplanar CT image reconstructions including MIPs were
obtained to evaluate the vascular anatomy.
CONTRAST:  100 cc Omnipaque 350 IV

[Series 5: thins · axial · 0.70mm/px · z∈[+1195,+1431]mm · 14 of 266 slices shown]
[im 15/266  lung]
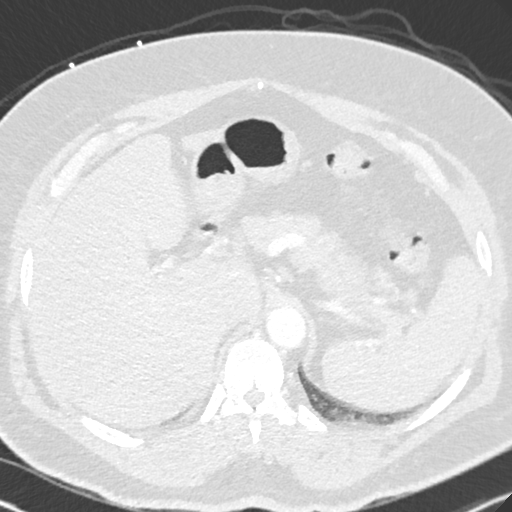
[im 30/266  soft-tissue]
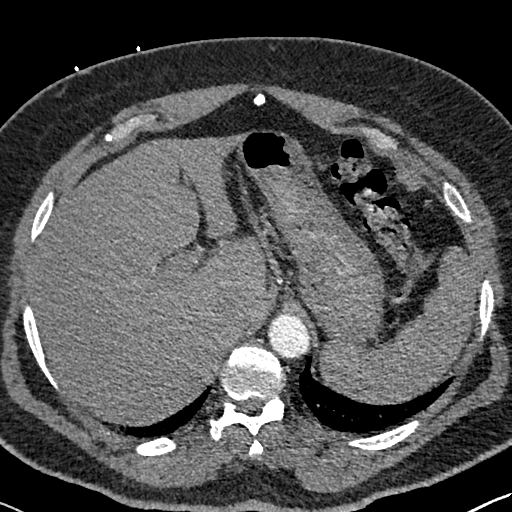
[im 59/266  lung]
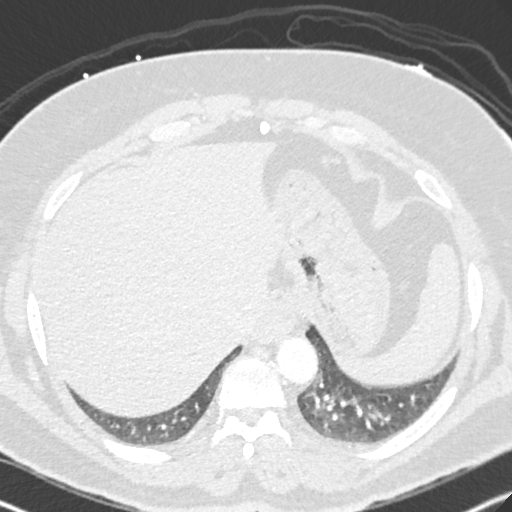
[im 74/266  soft-tissue]
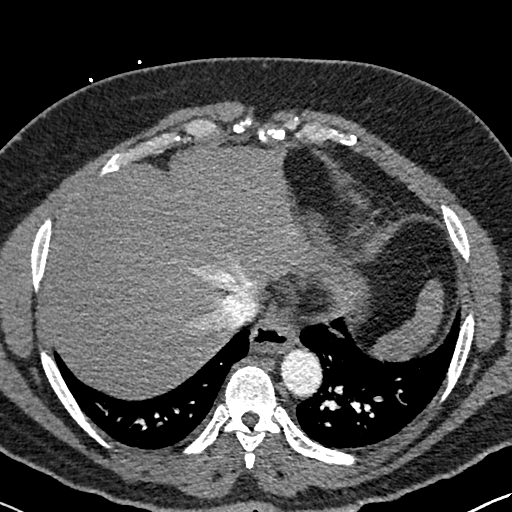
[im 89/266  lung]
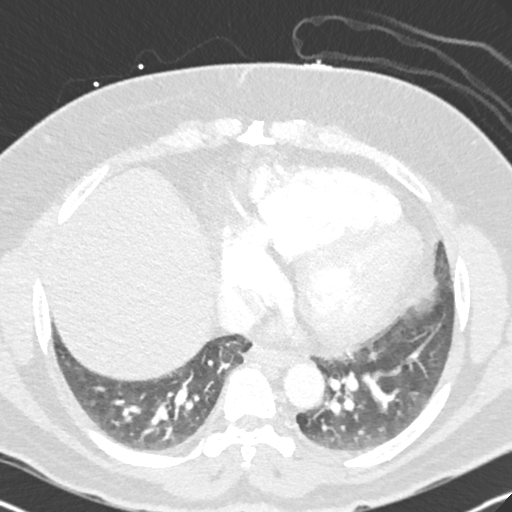
[im 104/266  soft-tissue]
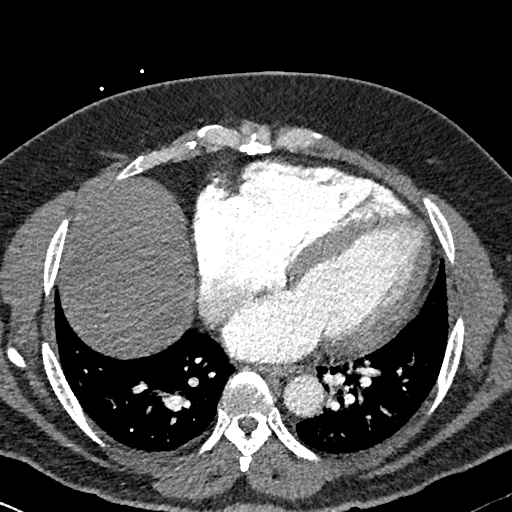
[im 118/266  lung]
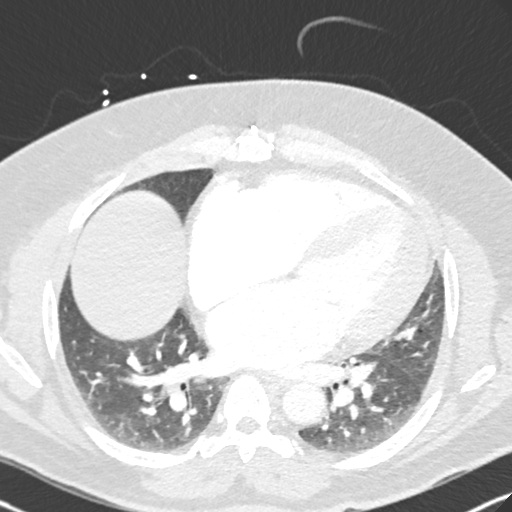
[im 148/266  soft-tissue]
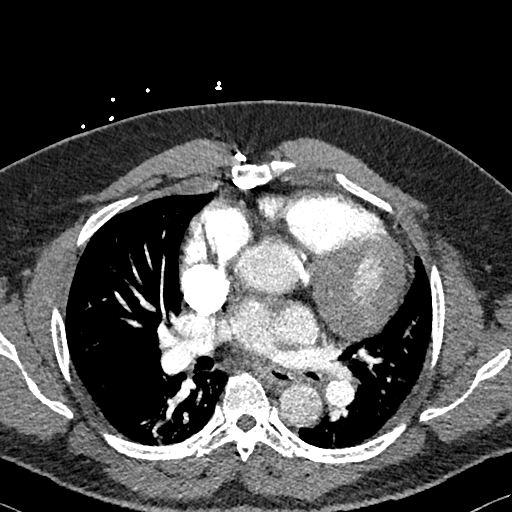
[im 162/266  lung]
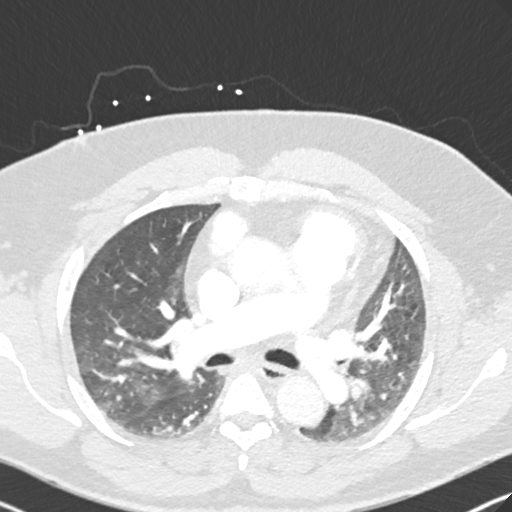
[im 177/266  soft-tissue]
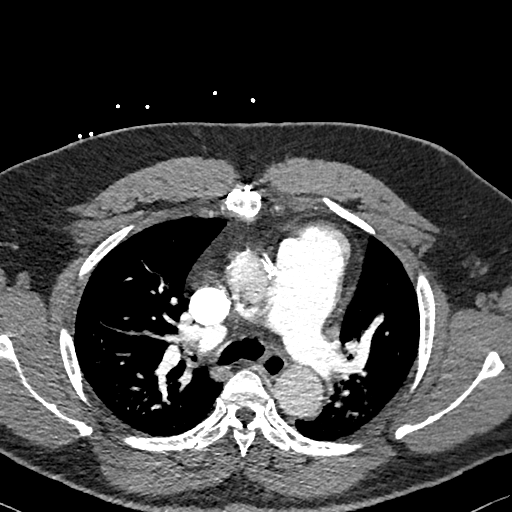
[im 192/266  lung]
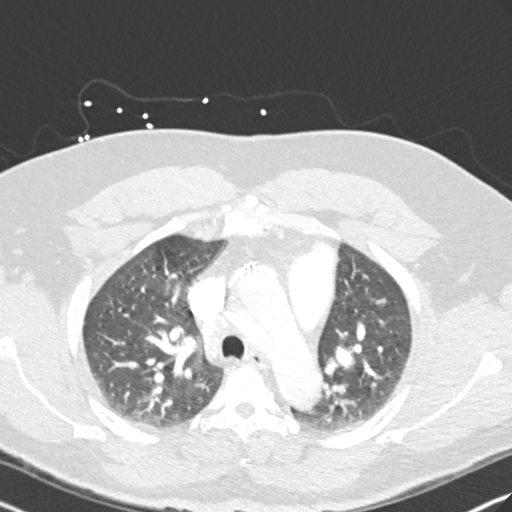
[im 207/266  soft-tissue]
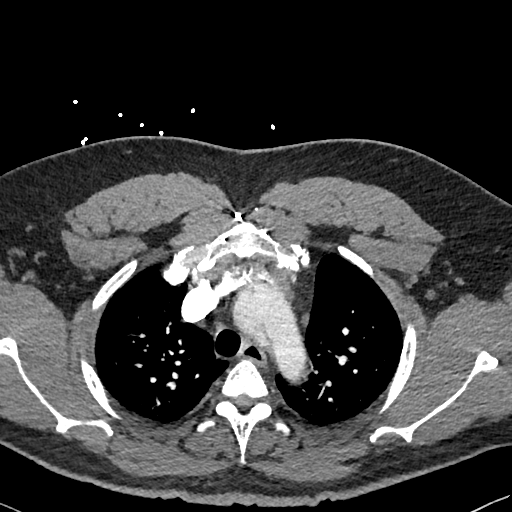
[im 236/266  lung]
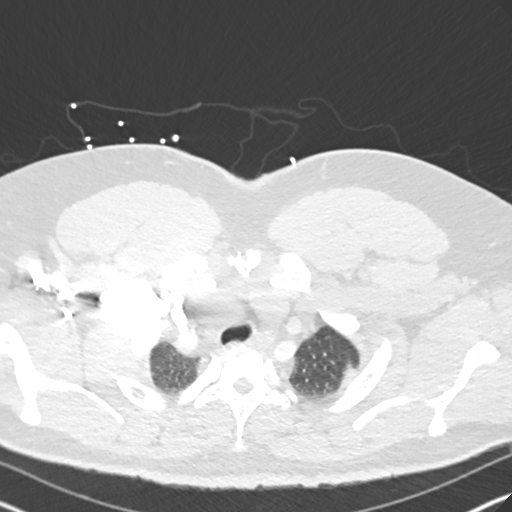
[im 251/266  soft-tissue]
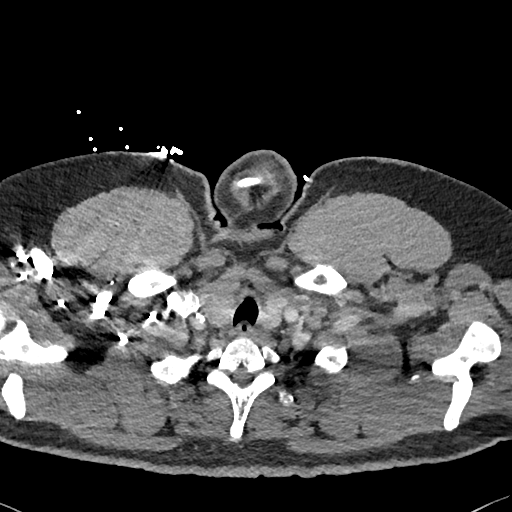

[Series 7: coronal mpr · coronal · 0.47mm/px · 3 of 151 slices shown]
[im 38/151  soft-tissue]
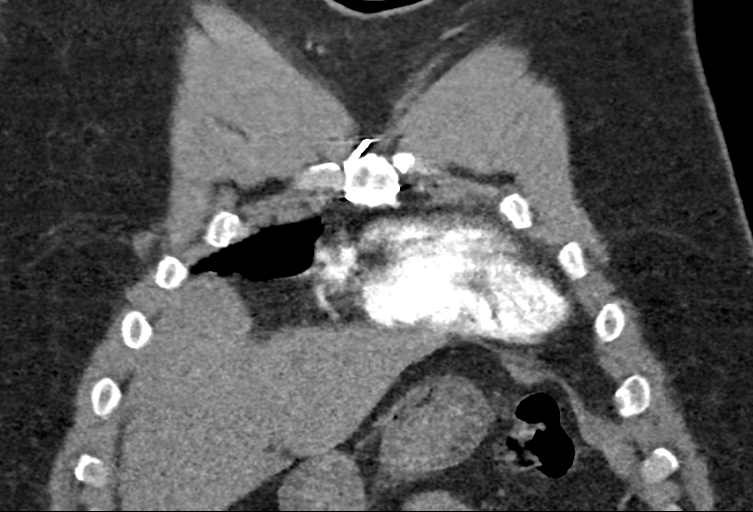
[im 76/151  soft-tissue]
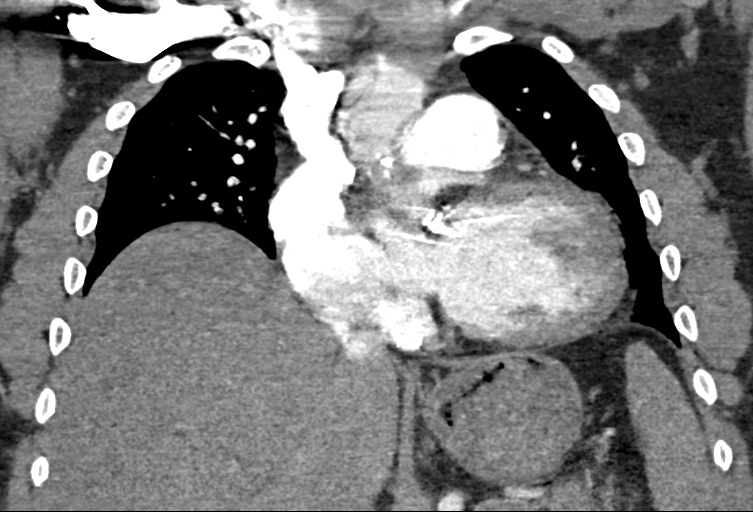
[im 113/151  soft-tissue]
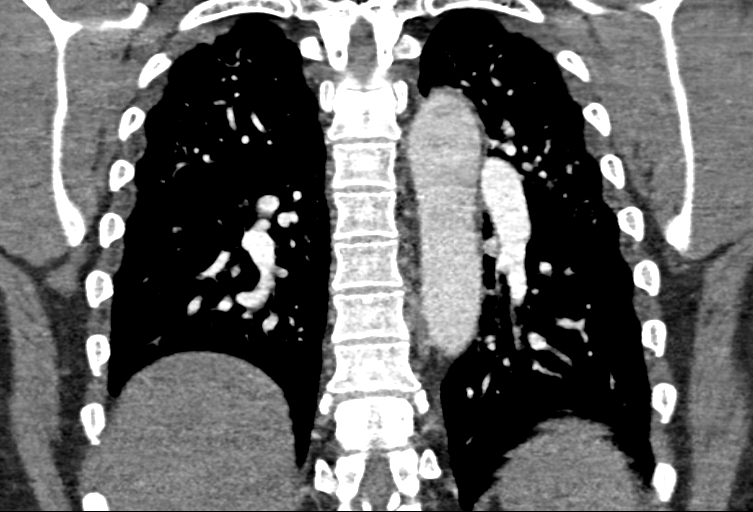

[17 of 46 positions shown; findings below may reference images not displayed]

FINDINGS: Thoracic aortic dissection is identified at the arch extending the
into the distal ascending thoracic aorta.

This appears to been present since an earlier exam of 12/31/2009
though is better opacified on the current study.

The aorta is mildly dilated at this level and shows evidence of
surgical repair; this likely represent a Type A dissection which has
undergone prior surgical repair.

Postsurgical changes of median sternotomy and aortic valve
replacement noted.

Distal thoracic aortic arch and descending thoracic aorta show no
evidence of dissection.

Pulmonary arteries patent.

No evidence of pulmonary embolism.

Scattered respiratory motion artifacts at lung bases.

Visualized portion of upper abdomen significant only for probable
cholelithiasis.

Lungs clear.

No pleural effusion or pneumothorax.

No acute osseous findings.

Review of the MIP images confirms the above findings.
IMPRESSION: No evidence of pulmonary embolism.

Postsurgical changes of aortic valve replacement and what is likely
prior repair of aType A aortic dissection, though an intimal flap
with fenestration is again seen at the mildly dilated proximal
aortic arch.

Appearance is grossly unchanged since 12/31/2009.

## 2013-03-24 MED ORDER — INSULIN ASPART 100 UNIT/ML ~~LOC~~ SOLN
0.0000 [IU] | Freq: Three times a day (TID) | SUBCUTANEOUS | Status: DC
  Administered 2013-03-25: 2 [IU] via SUBCUTANEOUS

## 2013-03-24 MED ORDER — HEPARIN BOLUS VIA INFUSION
4000.0000 [IU] | Freq: Once | INTRAVENOUS | Status: AC
  Administered 2013-03-24: 4000 [IU] via INTRAVENOUS
  Filled 2013-03-24: qty 4000

## 2013-03-24 MED ORDER — WARFARIN SODIUM 10 MG PO TABS
10.0000 mg | ORAL_TABLET | Freq: Once | ORAL | Status: AC
  Administered 2013-03-24: 10 mg via ORAL
  Filled 2013-03-24: qty 1

## 2013-03-24 MED ORDER — POTASSIUM CHLORIDE CRYS ER 20 MEQ PO TBCR
20.0000 meq | EXTENDED_RELEASE_TABLET | Freq: Two times a day (BID) | ORAL | Status: DC
  Administered 2013-03-24 – 2013-03-25 (×3): 20 meq via ORAL
  Filled 2013-03-24 (×4): qty 1

## 2013-03-24 MED ORDER — GLIMEPIRIDE 1 MG PO TABS
1.0000 mg | ORAL_TABLET | Freq: Every day | ORAL | Status: DC
  Administered 2013-03-24: 1 mg via ORAL
  Filled 2013-03-24 (×2): qty 1

## 2013-03-24 MED ORDER — WARFARIN SODIUM 5 MG PO TABS: 5.0000 mg | ORAL_TABLET | Freq: Every day | ORAL | Status: DC

## 2013-03-24 MED ORDER — SIMVASTATIN 20 MG PO TABS
20.0000 mg | ORAL_TABLET | Freq: Every day | ORAL | Status: DC
  Administered 2013-03-24: 20 mg via ORAL
  Filled 2013-03-24 (×2): qty 1

## 2013-03-24 MED ORDER — ONDANSETRON HCL 4 MG/2ML IJ SOLN: 4.0000 mg | Freq: Four times a day (QID) | INTRAMUSCULAR | Status: DC | PRN

## 2013-03-24 MED ORDER — HEPARIN (PORCINE) IN NACL 100-0.45 UNIT/ML-% IJ SOLN
1500.0000 [IU]/h | INTRAMUSCULAR | Status: DC
  Administered 2013-03-24: 1500 [IU]/h via INTRAVENOUS
  Filled 2013-03-24 (×2): qty 250

## 2013-03-24 MED ORDER — MORPHINE SULFATE 4 MG/ML IJ SOLN
4.0000 mg | Freq: Once | INTRAMUSCULAR | Status: AC
  Administered 2013-03-24: 4 mg via INTRAVENOUS
  Filled 2013-03-24: qty 1

## 2013-03-24 MED ORDER — HYDROMORPHONE HCL PF 1 MG/ML IJ SOLN: 0.5000 mg | INTRAMUSCULAR | Status: DC | PRN

## 2013-03-24 MED ORDER — IOHEXOL 350 MG/ML SOLN
100.0000 mL | Freq: Once | INTRAVENOUS | Status: AC | PRN
  Administered 2013-03-24: 100 mL via INTRAVENOUS

## 2013-03-24 MED ORDER — METOPROLOL SUCCINATE ER 100 MG PO TB24
200.0000 mg | ORAL_TABLET | Freq: Every day | ORAL | Status: DC
  Administered 2013-03-24 – 2013-03-25 (×2): 200 mg via ORAL
  Filled 2013-03-24 (×2): qty 2

## 2013-03-24 MED ORDER — INSULIN ASPART 100 UNIT/ML ~~LOC~~ SOLN: 0.0000 [IU] | Freq: Every day | SUBCUTANEOUS | Status: DC

## 2013-03-24 MED ORDER — NITROGLYCERIN 2 % TD OINT
1.0000 [in_us] | TOPICAL_OINTMENT | Freq: Once | TRANSDERMAL | Status: AC
  Administered 2013-03-24: 1 [in_us] via TOPICAL
  Filled 2013-03-24: qty 1

## 2013-03-24 MED ORDER — LOSARTAN POTASSIUM 50 MG PO TABS
100.0000 mg | ORAL_TABLET | Freq: Every day | ORAL | Status: DC
  Administered 2013-03-24 – 2013-03-25 (×2): 100 mg via ORAL
  Filled 2013-03-24 (×2): qty 2

## 2013-03-24 MED ORDER — ALUM & MAG HYDROXIDE-SIMETH 200-200-20 MG/5ML PO SUSP: 30.0000 mL | Freq: Four times a day (QID) | ORAL | Status: DC | PRN

## 2013-03-24 MED ORDER — AMLODIPINE BESYLATE 10 MG PO TABS
10.0000 mg | ORAL_TABLET | Freq: Every day | ORAL | Status: DC
  Administered 2013-03-24 – 2013-03-25 (×2): 10 mg via ORAL
  Filled 2013-03-24 (×2): qty 1

## 2013-03-24 MED ORDER — OXYCODONE HCL 5 MG PO TABS
5.0000 mg | ORAL_TABLET | ORAL | Status: DC | PRN
  Administered 2013-03-24: 5 mg via ORAL
  Filled 2013-03-24: qty 1

## 2013-03-24 MED ORDER — HEPARIN (PORCINE) IN NACL 100-0.45 UNIT/ML-% IJ SOLN
1950.0000 [IU]/h | INTRAMUSCULAR | Status: DC
  Administered 2013-03-24: 1700 [IU]/h via INTRAVENOUS
  Administered 2013-03-25: 1950 [IU]/h via INTRAVENOUS
  Filled 2013-03-24 (×3): qty 250

## 2013-03-24 MED ORDER — HEPARIN BOLUS VIA INFUSION
1500.0000 [IU] | Freq: Once | INTRAVENOUS | Status: AC
  Administered 2013-03-24: 1500 [IU] via INTRAVENOUS
  Filled 2013-03-24: qty 1500

## 2013-03-24 MED ORDER — WARFARIN - PHARMACIST DOSING INPATIENT
Freq: Every day | Status: DC
  Administered 2013-03-24: 18:00:00

## 2013-03-24 MED ORDER — ZOLPIDEM TARTRATE 5 MG PO TABS: 5.0000 mg | ORAL_TABLET | Freq: Every evening | ORAL | Status: DC | PRN

## 2013-03-24 MED ORDER — HEPARIN (PORCINE) IN NACL 100-0.45 UNIT/ML-% IJ SOLN: 1700.0000 [IU]/h | INTRAMUSCULAR | Status: DC

## 2013-03-24 MED ORDER — ACETAMINOPHEN 650 MG RE SUPP: 650.0000 mg | Freq: Four times a day (QID) | RECTAL | Status: DC | PRN

## 2013-03-24 MED ORDER — FUROSEMIDE 40 MG PO TABS
40.0000 mg | ORAL_TABLET | Freq: Every day | ORAL | Status: DC
  Administered 2013-03-24 – 2013-03-25 (×2): 40 mg via ORAL
  Filled 2013-03-24 (×2): qty 1

## 2013-03-24 MED ORDER — ACETAMINOPHEN 325 MG PO TABS: 650.0000 mg | ORAL_TABLET | Freq: Four times a day (QID) | ORAL | Status: DC | PRN

## 2013-03-24 MED ORDER — SODIUM CHLORIDE 0.9 % IV SOLN
INTRAVENOUS | Status: DC
  Administered 2013-03-24: 05:00:00 via INTRAVENOUS

## 2013-03-24 MED ORDER — ONDANSETRON HCL 4 MG PO TABS: 4.0000 mg | ORAL_TABLET | Freq: Four times a day (QID) | ORAL | Status: DC | PRN

## 2013-03-24 MED ORDER — VITAMIN D3 25 MCG (1000 UNIT) PO TABS
1000.0000 [IU] | ORAL_TABLET | Freq: Every day | ORAL | Status: DC
  Administered 2013-03-24 – 2013-03-25 (×2): 1000 [IU] via ORAL
  Filled 2013-03-24 (×2): qty 1

## 2013-03-24 MED ORDER — CLONIDINE HCL 0.1 MG PO TABS
0.1000 mg | ORAL_TABLET | Freq: Two times a day (BID) | ORAL | Status: DC
  Administered 2013-03-24 – 2013-03-25 (×3): 0.1 mg via ORAL
  Filled 2013-03-24 (×5): qty 1

## 2013-03-24 MED ORDER — ASPIRIN EC 325 MG PO TBEC
325.0000 mg | DELAYED_RELEASE_TABLET | Freq: Every day | ORAL | Status: DC
  Administered 2013-03-24 – 2013-03-25 (×2): 325 mg via ORAL
  Filled 2013-03-24 (×2): qty 1

## 2013-03-24 MED ORDER — VITAMIN D3 25 MCG (1000 UT) PO CAPS: 1000.0000 [IU] | ORAL_CAPSULE | Freq: Every day | ORAL | Status: DC

## 2013-03-24 NOTE — Progress Notes (Signed)
[facility] PROGRESS NOTE  [name] NFA:[id number] DOB: [birth year] DOA: 03/23/2013 PCP: [name], MD Primary Cardiologist: Dr. [name]  HPI/Brief narrative 47 year old male with history of CAD, DM 2, HL, spontaneous AA dissection with AV involvement, S/P mechanical aVR on Coumadin anticoagulation,HTN, A. fib, TIA, morbid obesity presented to the ED with complaints of chest pain. Patient states that he recently completed outpatient echo a week ago and his last stress test was approximately a year ago. In the ED, troponin negative, CTA negative for PE and subtherapeutic INR. Patient admitted for further evaluation.  Assessment/Plan:  Chest pain/CAD - Atypical, reproducible to palpation and seems musculoskeletal in etiology - Troponins x2 negative - Cardiology input appreciated - Follow results of outpatient 2-D echo. - Pain management  Hypertension - Mildly uncontrolled - Currently on amlodipine, clonidine, metoprolol and losartan. Monitor  Type II DM - Continue SSI - Hold oral hypoglycemics while hospitalized.  Mechanical aVR - Subtherapeutic INR on admission-1.44. - Continue IV heparin until INR therapeutic on Coumadin. Target INR: 2.5-3.5  Hyperlipidemia - Continue statins  Obesity  Mild thrombocytopenia - Monitor CBCs closely while on IV heparin.  DVT prophylaxis:  IV heparin drip Lines/catheters:  PIV Nutrition:  diabetic type  Activity:   up with assistance Code Status:  Full Family Communication: None Disposition Plan: Home when medically stable.   Consultants:  Cardiology  Procedures:  None  Antibiotics:  None   Subjective:  Indicates pain under the left breast reproducible to is palpation. Denies dyspnea. Mild headache. Denies lifting heavy weights or trauma.  Objective: Filed Vitals:   03/24/13 0545 03/24/13 0800 03/24/13 0827 03/24/13 1000  BP: 127/80 139/93  130/97  Pulse: 103 59  67  Temp:   97.4 F (36.3 C)    TempSrc:   Oral   Resp: 21 14  16   Height:      Weight:      SpO2: 95% 97%  100%    Intake/Output Summary (Last 24 hours) at 03/24/13 1122 Last data filed at 03/24/13 1000  Gross per 24 hour  Intake  382.5 ml  Output      0 ml  Net  382.5 ml   Filed Weights   03/24/13 0408  Weight: 138.3 kg (304 lb 14.3 oz)     Exam:  General exam: young male, moderately built and morbidly obese lying comfortably supine in bed. Respiratory system: Clear. No increased work of breathing. Reproducible left chest wall tenderness.  Cardiovascular system: S1 & S2 heard, RRR. No JVD, murmurs, gallops, clicks or pedal edema. Telemetry: Sinus rhythm  Gastrointestinal system: Abdomen is nondistended, soft and nontender. Normal bowel sounds heard. Central nervous system: Alert and oriented. No focal neurological deficits. Extremities: Symmetric 5 x 5 power.   Data Reviewed: Basic Metabolic Panel:  Recent Labs Lab 03/23/13 2236 03/24/13 0745  NA 139 136  K 3.8 3.8  CL 104 102  CO2 26 28  GLUCOSE 117* 109*  BUN 15 11  CREATININE 0.99 0.95  CALCIUM 9.3 8.8   Liver Function Tests: No results found for this basename: AST, ALT, ALKPHOS, BILITOT, PROT, ALBUMIN,  in the last 168 hours No results found for this basename: LIPASE, AMYLASE,  in the last 168 hours No results found for this basename: AMMONIA,  in the last 168 hours CBC:  Recent Labs Lab 03/23/13 2236 03/24/13 0745  WBC 6.0 4.6  HGB 13.9 13.1  HCT 37.9* 35.9*  MCV 79.6 78.2  PLT 150 141*   Cardiac Enzymes:  Recent Labs Lab 03/24/13 0253 03/24/13 0845  TROPONINI <0.30 <0.30   BNP (last 3 results) No results found for this basename: PROBNP,  in the last 8760 hours CBG: No results found for this basename: GLUCAP,  in the last 168 hours  Recent Results (from the past 240 hour(s))  MRSA PCR SCREENING     Status: None   Collection Time    03/24/13  3:50 AM      Result Value Range Status   MRSA by PCR NEGATIVE   NEGATIVE Final   Comment:            The GeneXpert MRSA Assay (FDA     approved for NASAL specimens     only), is one component of a     comprehensive MRSA colonization     surveillance program. It is not     intended to diagnose MRSA     infection nor to guide or     monitor treatment for     MRSA infections.      Additional labs: 1. None     Studies: Dg Chest 2 View  03/23/2013   CLINICAL DATA:  Non radiating sharp left chest pain intermittently over past few hr, history AVR, coronary artery disease, hypertension, diabetes  EXAM: CHEST  2 VIEW  COMPARISON:  04/09/2012  FINDINGS: Enlargement of cardiac silhouette post median sternotomy and AVR.  Pulmonary vascular congestion.  Mediastinal contours normal.  No gross failure or consolidation.  No pleural effusion or pneumothorax.  Bones unremarkable.  IMPRESSION: Enlargement of cardiac silhouette post AVR with pulmonary vascular congestion.  No acute abnormalities.   Electronically Signed   By: Ulyses Southward M.D.   On: 03/23/2013 23:10   Ct Angio Chest Pe W/cm &/or Wo Cm  03/24/2013   CLINICAL DATA:  Chest pain, history of repaired thoracic aortic dissection  EXAM: CT ANGIOGRAPHY CHEST WITH CONTRAST  TECHNIQUE: Multidetector CT imaging of the chest was performed using the standard protocol during bolus administration of intravenous contrast. Multiplanar CT image reconstructions including MIPs were obtained to evaluate the vascular anatomy.  CONTRAST:  100 cc Omnipaque 350 IV  COMPARISON:  05/26/2010  FINDINGS: Thoracic aortic dissection is identified at the arch extending the into the distal ascending thoracic aorta.  This appears to been present since an earlier exam of 12/31/2009 though is better opacified on the current study.  The aorta is mildly dilated at this level and shows evidence of surgical repair; this likely represent a Type A dissection which has undergone prior surgical repair.  Postsurgical changes of median sternotomy and  aortic valve replacement noted.  Distal thoracic aortic arch and descending thoracic aorta show no evidence of dissection.  Pulmonary arteries patent.  No evidence of pulmonary embolism.  Scattered respiratory motion artifacts at lung bases.  Visualized portion of upper abdomen significant only for probable cholelithiasis.  Lungs clear.  No pleural effusion or pneumothorax.  No acute osseous findings.  Review of the MIP images confirms the above findings.  IMPRESSION: No evidence of pulmonary embolism.  Postsurgical changes of aortic valve replacement and what is likely prior repair of aType A aortic dissection, though an intimal flap with fenestration is again seen at the mildly dilated proximal aortic arch.  Appearance is grossly unchanged since 12/31/2009.   Electronically Signed   By: Ulyses Southward M.D.   On: 03/24/2013 01:26        Scheduled Meds: . amLODipine  10 mg Oral Daily  . aspirin  324 mg Oral Once  . aspirin EC  325 mg Oral Daily  . cholecalciferol  1,000 Units Oral Daily  . cloNIDine  0.1 mg Oral BID  . furosemide  40 mg Oral Daily  . glimepiride  1 mg Oral Q breakfast  . insulin aspart  0-5 Units Subcutaneous QHS  . insulin aspart  0-9 Units Subcutaneous TID WC  . losartan  100 mg Oral Daily  . metoprolol  200 mg Oral Daily  . potassium chloride SA  20 mEq Oral BID  . simvastatin  20 mg Oral q1800  . warfarin  10 mg Oral ONCE-1800  . Warfarin - Pharmacist Dosing Inpatient   Does not apply q1800   Continuous Infusions: . sodium chloride 75 mL/hr at 03/24/13 0441  . heparin 1,500 Units/hr (03/24/13 0500)    Principal Problem:   ACS (acute coronary syndrome) Active Problems:   DIABETES MELLITUS, TYPE II   HYPERLIPIDEMIA   HYPERTENSION   CAD   Aortic valve disorders   Precordial chest pain    Time spent: 35 minutes.    Marcellus Scott, MD, FACP, FHM. [facility] Pager [phone]  If 7PM-7AM, please contact night-coverage [email or web address] Password  [email or web address] 03/24/2013, 11:22 AM    LOS: 1 day

## 2013-03-24 NOTE — Progress Notes (Signed)
ANTICOAGULATION CONSULT NOTE - Follow up Consult  Pharmacy Consult for Heparin Indication: h/o AVR, afib, subtherapeutic INR  Allergies  Allergen Reactions  . Insulin Glargine     edema    Patient Measurements: Height: 6\' 2"  (188 cm) Weight: 304 lb 14.3 oz (138.3 kg) IBW/kg (Calculated) : 82.2 Heparin Dosing Weight: ~113kg  Vital Signs: Temp: 97.4 F (36.3 C) (11/02 0827) Temp src: Oral (11/02 0827) BP: 130/97 mmHg (11/02 1000) Pulse Rate: 67 (11/02 1000)  Labs:  Recent Labs  03/23/13 2236 03/23/13 2344 03/24/13 0253 03/24/13 0745 03/24/13 0845 03/24/13 1000  HGB 13.9  --   --  13.1  --   --   HCT 37.9*  --   --  35.9*  --   --   PLT 150  --   --  141*  --   --   LABPROT  --  17.2*  --   --   --   --   INR  --  1.44  --   --   --   --   HEPARINUNFRC  --   --   --   --   --  0.26*  CREATININE 0.99  --   --  0.95  --   --   TROPONINI  --   --  <0.30  --  <0.30  --     Medical History: Past Medical History  Diagnosis Date  . Diabetes mellitus   . Hypertension   . Unspecified vitamin D deficiency 08/07/2008  . HYPERLIPIDEMIA 08/20/2007  . CAD 05/21/2007  . Aortic valve disorders 09/30/2009  . Gross hematuria 08/07/2008  . TRANSIENT ISCHEMIC ATTACK, HX OF 12/26/2007  . NEPHROLITHIASIS, HX OF 05/21/2007  . Atrial fibrillation 07/27/2010    Medications:  Warfarin PTA Dose: 10 mg Thurs/Sun, 5 mg all other days Heparin @ 1500 units/hr   Assessment: 47 y/o M with hx of afib, AVR on warfarin PTA. INR on 10/21 per warfain clinic notes was 2, but INR 11/1 is SUB-therapeutic at 1.44. CBC/renal function stable. No bleeding noted. HL 0.26  Goal of Therapy:  INR 2-3 Heparin level 0.3-0.7 units/ml Monitor platelets by anticoagulation protocol: Yes   Plan:  -Heparin 1500 units BOLUS x 1 -Increase heparin drip to 1700 units/hr -6 hour HL at 1830 -Daily CBC/HL -Monitor for bleeding -DC heparin drip when INR >2  Vinnie Level, PharmD.  [place] License  #[id number] Application for [place] reciprocity pending  Clinical Pharmacist Pager [phone]    Thank you for allowing pharmacy to be a part of this patients care team.  Lovenia Kim Pharm.D., BCPS Clinical Pharmacist 03/24/2013 12:43 PM Pager: [phone] Phone: [phone]

## 2013-03-24 NOTE — H&P (Signed)
[facility] History and Physical  [name] FAO:[id number] DOB: [birth year] DOA: 03/23/2013  Referring physician: EDP PCP: [name], MD  Specialists:   Chief Complaint: Chest Pain  HPI: [name] is a 47 y.o. male with CAD, DM2, Hyperlipidemia, and Hx of Prosthetic AVR on Coumadin who presents to the ED with complaints of 9/10 sharp intermittent Chest pain since 8 pm.  The pain started after he had his dinner and was sitting down watching television.  He reports that the pain lasted 2-3 minutes at a time and would improve when he would lay flat.   The pain kept recurring so he decided to get to the ED.   IN the ED he was evaluated and weas found to have a negative troponin, and a sub-therapeutic INR.      Review of Systems: The patient denies anorexia, fever, chills, headaches, weight loss,, vision loss, diplopia, dizziness, decreased hearing, rhinitis, hoarseness, chest pain, syncope, dyspnea on exertion, peripheral edema, balance deficits, cough, hemoptysis, abdominal pain, nausea, vomiting, diarrhea, constipation, hematemesis, melena, hematochezia, severe indigestion/heartburn, dysuria, hematuria, incontinence, muscle weakness, suspicious skin lesions, transient blindness, difficulty walking, depression, unusual weight change, abnormal bleeding, enlarged lymph nodes, angioedema, and breast masses.    Past Medical History  Diagnosis Date  . Diabetes mellitus   . Hypertension   . Unspecified vitamin D deficiency 08/07/2008  . HYPERLIPIDEMIA 08/20/2007  . CAD 05/21/2007  . Aortic valve disorders 09/30/2009  . Gross hematuria 08/07/2008  . TRANSIENT ISCHEMIC ATTACK, HX OF 12/26/2007  . NEPHROLITHIASIS, HX OF 05/21/2007  . Atrial fibrillation 07/27/2010    Past Surgical History  Procedure Laterality Date  . Appendectomy  1998  . Coronary artery bypass graft  2000    Prior to Admission medications   Medication Sig Start Date End Date Taking? Authorizing Provider   amLODipine (NORVASC) 10 MG tablet take 1 tablet by mouth once daily for high blood pressure 02/07/13  Yes [name], MD  aspirin 81 MG EC tablet Take 81 mg by mouth daily.     Yes Historical Provider, MD  Cholecalciferol (VITAMIN D3) 1000 UNITS CAPS Take 1,000 Units by mouth daily.    Yes Historical Provider, MD  cloNIDine (CATAPRES) 0.1 MG tablet take 1 tablet by mouth twice a day 02/06/13  Yes Lewayne Bunting, MD  furosemide (LASIX) 40 MG tablet take 1 tablet by mouth once daily 12/24/12  Yes [name], MD  glimepiride (AMARYL) 1 MG tablet take 1 tablet by mouth once daily before BREAKFAST 12/24/12  Yes [name], MD  losartan (COZAAR) 100 MG tablet take 1 tablet by mouth once daily 12/24/12  Yes [name], MD  metFORMIN (GLUCOPHAGE) 1000 MG tablet take 1 tablet by mouth twice a day with food 02/06/13  Yes [name], MD  metoprolol (TOPROL-XL) 200 MG 24 hr tablet Take 200 mg by mouth daily.     Yes Historical Provider, MD  potassium chloride SA (K-DUR,KLOR-CON) 20 MEQ tablet Take 20 mEq by mouth 2 (two) times daily.   Yes Historical Provider, MD  pravastatin (PRAVACHOL) 40 MG tablet Take 40 mg by mouth daily.   Yes Historical Provider, MD  warfarin (COUMADIN) 5 MG tablet Take 5-10 mg by mouth daily. Thursday and Sunday take 10mg  (2 tablets) and 5mg  (1 tablet) all other days.   Yes Historical Provider, MD    Allergies  Allergen Reactions  . Insulin Glargine     edema    Social History:  reports  that he has never smoked. He does not have any smokeless tobacco history on file. He reports that he does not drink alcohol or use illicit drugs.     Family History  Problem Relation Age of Onset  . Coronary artery disease Mother   . Hypertension Mother   . Diabetes Brother   . Coronary artery disease Other 31    male 1st degree relative, CABG in his 68's (father)     Physical Exam:  GEN:  Pleasant  47 y.o. Obese African American male  examined  and in no acute distress;  cooperative with exam Filed Vitals:   03/24/13 0000 03/24/13 0100 03/24/13 0230 03/24/13 0248  BP: 133/77 134/81  138/74  Pulse: 66 66 67 65  Temp:      TempSrc:      Resp: 12 18 18 16   SpO2: 96% 93% 96% 93%   Blood pressure 138/74, pulse 65, temperature 97.6 F (36.4 C), temperature source Oral, resp. rate 16, SpO2 93.00%. PSYCH: She is alert and oriented x4; does not appear anxious does not appear depressed; affect is normal HEENT: Normocephalic and Atraumatic, Mucous membranes pink; PERRLA; EOM intact; Fundi:  Benign;  No scleral icterus, Nares: Patent, Oropharynx: Clear, Fair Dentition, Neck:  FROM, no cervical lymphadenopathy nor thyromegaly or carotid bruit; no JVD; Breasts:: Not examined CHEST WALL: No tenderness CHEST: Normal respiration, clear to auscultation bilaterally HEART: Regular rate and rhythm; no murmurs rubs or gallops BACK: No kyphosis or scoliosis; no CVA tenderness ABDOMEN: Positive Bowel Sounds, Obese, soft non-tender; no masses, no organomegaly, no pannus; no intertriginous candida. Rectal Exam: Not done EXTREMITIES: No cyanosis, clubbing or edema; no ulcerations. Genitalia: not examined PULSES: 2+ and symmetric SKIN: Normal hydration no rash or ulceration CNS: Cranial nerves 2-12 grossly intact no focal neurologic deficit    Labs on Admission:  Basic Metabolic Panel:  Recent Labs Lab 03/23/13 2236  NA 139  K 3.8  CL 104  CO2 26  GLUCOSE 117*  BUN 15  CREATININE 0.99  CALCIUM 9.3   Liver Function Tests: No results found for this basename: AST, ALT, ALKPHOS, BILITOT, PROT, ALBUMIN,  in the last 168 hours No results found for this basename: LIPASE, AMYLASE,  in the last 168 hours No results found for this basename: AMMONIA,  in the last 168 hours CBC:  Recent Labs Lab 03/23/13 2236  WBC 6.0  HGB 13.9  HCT 37.9*  MCV 79.6  PLT 150   Cardiac Enzymes:  Recent Labs Lab 03/24/13 0253  TROPONINI <0.30    BNP (last 3 results) No  results found for this basename: PROBNP,  in the last 8760 hours CBG: No results found for this basename: GLUCAP,  in the last 168 hours  Radiological Exams on Admission: Dg Chest 2 View  03/23/2013   CLINICAL DATA:  Non radiating sharp left chest pain intermittently over past few hr, history AVR, coronary artery disease, hypertension, diabetes  EXAM: CHEST  2 VIEW  COMPARISON:  04/09/2012  FINDINGS: Enlargement of cardiac silhouette post median sternotomy and AVR.  Pulmonary vascular congestion.  Mediastinal contours normal.  No gross failure or consolidation.  No pleural effusion or pneumothorax.  Bones unremarkable.  IMPRESSION: Enlargement of cardiac silhouette post AVR with pulmonary vascular congestion.  No acute abnormalities.   Electronically Signed   By: Ulyses Southward M.D.   On: 03/23/2013 23:10   Ct Angio Chest Pe W/cm &/or Wo Cm  03/24/2013   CLINICAL DATA:  Chest pain, history  of repaired thoracic aortic dissection  EXAM: CT ANGIOGRAPHY CHEST WITH CONTRAST  TECHNIQUE: Multidetector CT imaging of the chest was performed using the standard protocol during bolus administration of intravenous contrast. Multiplanar CT image reconstructions including MIPs were obtained to evaluate the vascular anatomy.  CONTRAST:  100 cc Omnipaque 350 IV  COMPARISON:  05/26/2010  FINDINGS: Thoracic aortic dissection is identified at the arch extending the into the distal ascending thoracic aorta.  This appears to been present since an earlier exam of 12/31/2009 though is better opacified on the current study.  The aorta is mildly dilated at this level and shows evidence of surgical repair; this likely represent a Type A dissection which has undergone prior surgical repair.  Postsurgical changes of median sternotomy and aortic valve replacement noted.  Distal thoracic aortic arch and descending thoracic aorta show no evidence of dissection.  Pulmonary arteries patent.  No evidence of pulmonary embolism.  Scattered  respiratory motion artifacts at lung bases.  Visualized portion of upper abdomen significant only for probable cholelithiasis.  Lungs clear.  No pleural effusion or pneumothorax.  No acute osseous findings.  Review of the MIP images confirms the above findings.  IMPRESSION: No evidence of pulmonary embolism.  Postsurgical changes of aortic valve replacement and what is likely prior repair of aType A aortic dissection, though an intimal flap with fenestration is again seen at the mildly dilated proximal aortic arch.  Appearance is grossly unchanged since 12/31/2009.   Electronically Signed   By: Ulyses Southward M.D.   On: 03/24/2013 01:26     EKG: Independently reviewed.    Assessment/Plan Principal Problem:   ACS (acute coronary syndrome) Active Problems:   DIABETES MELLITUS, TYPE II   HYPERLIPIDEMIA   HYPERTENSION   CAD   Aortic valve disorders    1.  ACS/Chest Pain-     Stepdown Bed, Monitor Rhythm, Cycle Troponins, Nitroglycerin, O2, and ASA Rx.    2.   DM2-  Continue Amaryl,  Hold Metformin Rx,  Add SSI coverage PRN.  Check HbA1C.    3.   CAD hx-  On ASA, Metoprolol, Pravastatin, and Losartan Rx.    4.  Prosthetic AVR-  On coumadin but Sub-therapeutic,  IV Heparin drip ordered and adjustment of Coumadin , PT.INR q AM.    5.   Hyperlipidemia-   On statin Rx of Pravastatin, continue.    6.   HTN- continue Clonidine, Amlodipine, Metoprolol, Losartan, and Lasix Rx.   Monitor BPs.        Code Status:    FULL CODE Family Communication:  No Family Present Disposition Plan:   Observation    Time spent:  4 Minutes  Ron Parker [facility] Pager [phone]  If 7PM-7AM, please contact night-coverage [email or web address] Password [email or web address] 03/24/2013, 4:14 AM

## 2013-03-24 NOTE — Consult Note (Signed)
Cardiology Consultation Note  Patient ID: Shaughn Thomley, MRN: [id number], DOB/AGE: 48-01-1966 48 y.o. Admit date: 03/23/2013   Date of Consult: 03/24/2013 Primary Physician: [name], MD Primary Cardiologist: Dr Jens Som   Chief Complaint: chest pain  Reason for Consult: precordial chest pain    Assessment and Plan: Chest pain - non cardiac  HTN - controlled  Mechanical AVR - anticoagulated - unclear why INR subtherapeutic since pt reports compliance   Hx of Type A dissection s/p repair  DM type II   -Pt has had a CT chest which is stable. Recent echo approximately 2 weeks ago in clinic per pt. Will get hold of the record. If echo is unremarkable, no further workup from cardiac stand point  - rec pharmacy consult for anticoagulation , cont coumadin and IV heparin for now.     HPI: 47 yr old male with hx of HTN, mechanical AVR, Type A AD s/p repair in 2000 here with chest pain .   HPI ; pt states that yesterday after going to church he came back home and  started experiencing sharp  Left sided chest pain. This was sharp , excrutiating in nature, made him jump up .is worse when he turns in bed and with palpation . No exertional component. Ongoing off and on.  Otherwise pt denies any  orthopnea, PND , LE edema , DOE , chest pain, focal weakness, syncope, bleeding diathesis , claudication , palpitation etc .  Reports medication compliance including coumadin.   Past Medical History  Diagnosis Date  . Diabetes mellitus   . Hypertension   . Unspecified vitamin D deficiency 08/07/2008  . HYPERLIPIDEMIA 08/20/2007  . CAD 05/21/2007  . Aortic valve disorders 09/30/2009  . Gross hematuria 08/07/2008  . TRANSIENT ISCHEMIC ATTACK, HX OF 12/26/2007  . NEPHROLITHIASIS, HX OF 05/21/2007  . Atrial fibrillation 07/27/2010      Most Recent Cardiac Studies:    Surgical History:  Past Surgical History  Procedure Laterality Date  . Appendectomy  1998  . Coronary artery bypass graft  2000      Home Meds: Prior to Admission medications   Medication Sig Start Date End Date Taking? Authorizing Provider  amLODipine (NORVASC) 10 MG tablet take 1 tablet by mouth once daily for high blood pressure 02/07/13  Yes [name], MD  aspirin 81 MG EC tablet Take 81 mg by mouth daily.     Yes Historical Provider, MD  Cholecalciferol (VITAMIN D3) 1000 UNITS CAPS Take 1,000 Units by mouth daily.    Yes Historical Provider, MD  cloNIDine (CATAPRES) 0.1 MG tablet take 1 tablet by mouth twice a day 02/06/13  Yes Lewayne Bunting, MD  furosemide (LASIX) 40 MG tablet take 1 tablet by mouth once daily 12/24/12  Yes [name], MD  glimepiride (AMARYL) 1 MG tablet take 1 tablet by mouth once daily before BREAKFAST 12/24/12  Yes [name], MD  losartan (COZAAR) 100 MG tablet take 1 tablet by mouth once daily 12/24/12  Yes [name], MD  metFORMIN (GLUCOPHAGE) 1000 MG tablet take 1 tablet by mouth twice a day with food 02/06/13  Yes [name], MD  metoprolol (TOPROL-XL) 200 MG 24 hr tablet Take 200 mg by mouth daily.     Yes Historical Provider, MD  potassium chloride SA (K-DUR,KLOR-CON) 20 MEQ tablet Take 20 mEq by mouth 2 (two) times daily.   Yes Historical Provider, MD  pravastatin (PRAVACHOL) 40 MG tablet Take 40 mg by  mouth daily.   Yes Historical Provider, MD  warfarin (COUMADIN) 5 MG tablet Take 5-10 mg by mouth daily. Thursday and Sunday take 10mg  (2 tablets) and 5mg  (1 tablet) all other days.   Yes Historical Provider, MD    Inpatient Medications:  . amLODipine  10 mg Oral Daily  . aspirin  324 mg Oral Once  . aspirin EC  325 mg Oral Daily  . cholecalciferol  1,000 Units Oral Daily  . cloNIDine  0.1 mg Oral BID  . furosemide  40 mg Oral Daily  . glimepiride  1 mg Oral Q breakfast  . insulin aspart  0-5 Units Subcutaneous QHS  . insulin aspart  0-9 Units Subcutaneous TID WC  . losartan  100 mg Oral Daily  . metoprolol  200 mg Oral Daily  . potassium chloride SA  20 mEq Oral BID  .  simvastatin  20 mg Oral q1800  . warfarin  10 mg Oral ONCE-1800  . Warfarin - Pharmacist Dosing Inpatient   Does not apply q1800   . sodium chloride 75 mL/hr at 03/24/13 0441  . heparin 1,500 Units/hr (03/24/13 0322)    Allergies:  Allergies  Allergen Reactions  . Insulin Glargine     edema    History   Social History  . Marital Status: Legally Separated    Spouse Name: N/A    Number of Children: 4  . Years of Education: N/A   Occupational History  . DISABLED    Social History Main Topics  . Smoking status: Never Smoker   . Smokeless tobacco: Not on file  . Alcohol Use: No  . Drug Use: No  . Sexual Activity: Not on file   Other Topics Concern  . Not on file   Social History Narrative  . No narrative on file     Family History  Problem Relation Age of Onset  . Coronary artery disease Mother   . Hypertension Mother   . Diabetes Brother   . Coronary artery disease Other 30    male 1st degree relative, CABG in his 36's (father)     Review of Systems: General: negative for chills, fever, night sweats or weight changes.  Cardiovascular: negative for chest pain, edema, orthopnea, palpitations, paroxysmal nocturnal dyspnea, shortness of breath or dyspnea on exertion Dermatological: negative for rash Respiratory: negative for cough or wheezing Urologic: negative for hematuria Abdominal: negative for nausea, vomiting, diarrhea, bright red blood per rectum, melena, or hematemesis Neurologic: negative for visual changes, syncope, or dizziness All other systems reviewed and are otherwise negative except as noted above.  Labs:  Recent Labs  03/24/13 0253  TROPONINI <0.30   Lab Results  Component Value Date   WBC 6.0 03/23/2013   HGB 13.9 03/23/2013   HCT 37.9* 03/23/2013   MCV 79.6 03/23/2013   PLT 150 03/23/2013    Recent Labs Lab 03/23/13 2236  NA 139  K 3.8  CL 104  CO2 26  BUN 15  CREATININE 0.99  CALCIUM 9.3  GLUCOSE 117*   Lab Results   Component Value Date   CHOL 130 11/02/2012   HDL 32.50* 11/02/2012   LDLCALC 70 11/02/2012   TRIG 138.0 11/02/2012   Lab Results  Component Value Date   DDIMER  Value: 0.72        AT THE INHOUSE ESTABLISHED CUTOFF VALUE OF 0.48 ug/mL FEU, THIS ASSAY HAS BEEN DOCUMENTED IN THE LITERATURE TO HAVE A SENSITIVITY AND NEGATIVE PREDICTIVE VALUE OF AT LEAST 98  TO 99%.  THE TEST RESULT SHOULD BE CORRELATED WITH AN ASSESSMENT OF THE CLINICAL PROBABILITY OF DVT / VTE.* 05/26/2010    Radiology/Studies:  Dg Chest 2 View  03/23/2013   IMPRESSION: Enlargement of cardiac silhouette post AVR with pulmonary vascular congestion.  No acute abnormalities.   Electronically Signed   By: Ulyses Southward M.D.   On: 03/23/2013 23:10   Ct Angio Chest Pe W/cm &/or Wo Cm  03/24/2013     IMPRESSION: No evidence of pulmonary embolism.  Postsurgical changes of aortic valve replacement and what is likely prior repair of aType A aortic dissection, though an intimal flap with fenestration is again seen at the mildly dilated proximal aortic arch.  Appearance is grossly unchanged since 12/31/2009.   Electronically Signed   By: Ulyses Southward M.D.   On: 03/24/2013 01:26    EKG: NSR , LVH    Physical Exam Blood pressure 138/74, pulse 65, temperature 97.6 F (36.4 C), temperature source Oral, resp. rate 16, SpO2 93.00%. General: Well developed, well nourished, in no acute distress.  Neck: Negative for carotid bruits. JVD not elevated. Lungs: Clear bilaterally to auscultation without wheezes, rales, or rhonchi. Breathing is unlabored. Heart: RRR with S1 S2. No murmurs, rubs, or gallops appreciated. Abdomen: Soft, non-tender, non-distended with normoactive bowel sounds. No hepatomegaly. No rebound/guarding. No obvious abdominal masses. Extremities: No clubbing or cyanosis. No edema.  Distal pedal pulses are 2+ and equal bilaterally. Neuro: Alert and oriented X 3. No facial asymmetry. No focal deficit. Moves all extremities  spontaneously. Psych:  Responds to questions appropriately with a normal affect.    Lovina Reach, A M.D  03/24/2013, 5:25 AM

## 2013-03-24 NOTE — Progress Notes (Signed)
Patient ID: [name], male   DOB: [birth year], 47 y.o.   MRN: [id number]    SUBJECTIVE:  Complete cardiology consult was done early this morning. The patient did have an outpatient echo on March 12, 2013. Ejection fraction was 55-60%. The prosthetic aortic valve is working well. Cardiology plans were outlined in the consult note earlier today.  Jerral Bonito, MD

## 2013-03-24 NOTE — Progress Notes (Signed)
ANTICOAGULATION CONSULT NOTE - Initial Consult  Pharmacy Consult for Heparin/Warfarin  Indication: h/o AVR, afib, subtherapeutic INR  Allergies  Allergen Reactions  . Insulin Glargine     edema    Patient Measurements:   Heparin Dosing Weight: ~113kg  Vital Signs: Temp: 97.6 F (36.4 C) (11/01 2228) Temp src: Oral (11/01 2228) BP: 134/81 mmHg (11/02 0100) Pulse Rate: 67 (11/02 0230)  Labs:  Recent Labs  03/23/13 2236 03/23/13 2344  HGB 13.9  --   HCT 37.9*  --   PLT 150  --   LABPROT  --  17.2*  INR  --  1.44  CREATININE 0.99  --     Medical History: Past Medical History  Diagnosis Date  . Diabetes mellitus   . Hypertension   . Unspecified vitamin D deficiency 08/07/2008  . HYPERLIPIDEMIA 08/20/2007  . CAD 05/21/2007  . Aortic valve disorders 09/30/2009  . Gross hematuria 08/07/2008  . TRANSIENT ISCHEMIC ATTACK, HX OF 12/26/2007  . NEPHROLITHIASIS, HX OF 05/21/2007  . Atrial fibrillation 07/27/2010    Medications:  Warfarin PTA Dose: 10 mg Thurs/Sun, 5 mg all other days  Assessment: 47 y/o M with hx of afib, AVR on warfarin PTA. INR on 10/21 per warfain clinic notes was 2, but INR 11/1 is SUB-therapeutic at 1.44. CBC/renal function stable. No bleeding noted.   Goal of Therapy:  INR 2-3 Heparin level 0.3-0.7 units/ml Monitor platelets by anticoagulation protocol: Yes   Plan:  -Warfarin 10 mg x 1 today at 1800 -Daily INR -Heparin 4000 units BOLUS x 1 -Start heparin drip at 1500 units/hr -6 hour HL at 1000 -Daily CBC/HL -Monitor for bleeding -DC heparin drip when INR >2  Thank you for allowing me to take part in this patient's care,  Abran Duke, PharmD Clinical Pharmacist Phone: [phone] Pager: [phone] 03/24/2013 2:58 AM

## 2013-03-24 NOTE — Progress Notes (Signed)
03/24/13  Pharmacy- Heparin 2025  Heparin level = 0.28 on 1700 units/hr  A/P:  47yo male with AVR on heparin bridge.  Heparin level remain sub-therapeutic despite rate adjustment earlier today.  Have verified with RN that pump is running at correct rate and there have been no issues with the IV.  1.  Increase heparin to 1950 units/hr 2.  Check HL in 6hr  Marisue Humble, PharmD Clinical Pharmacist [facility]

## 2013-03-25 DIAGNOSIS — Z954 Presence of other heart-valve replacement: Secondary | ICD-10-CM

## 2013-03-25 DIAGNOSIS — I10 Essential (primary) hypertension: Secondary | ICD-10-CM | POA: Diagnosis not present

## 2013-03-25 DIAGNOSIS — E119 Type 2 diabetes mellitus without complications: Secondary | ICD-10-CM | POA: Diagnosis not present

## 2013-03-25 DIAGNOSIS — R079 Chest pain, unspecified: Secondary | ICD-10-CM | POA: Diagnosis not present

## 2013-03-25 DIAGNOSIS — I200 Unstable angina: Secondary | ICD-10-CM | POA: Diagnosis not present

## 2013-03-25 LAB — GLUCOSE, CAPILLARY: Glucose-Capillary: 154 mg/dL — ABNORMAL HIGH (ref 70–99)

## 2013-03-25 LAB — CBC
HCT: 35.7 % — ABNORMAL LOW (ref 39.0–52.0)
Hemoglobin: 13 g/dL (ref 13.0–17.0)
MCHC: 36.4 g/dL — ABNORMAL HIGH (ref 30.0–36.0)
MCV: 78.1 fL (ref 78.0–100.0)
RDW: 15.1 % (ref 11.5–15.5)
WBC: 5.4 10*3/uL (ref 4.0–10.5)

## 2013-03-25 LAB — PROTIME-INR: INR: 1.71 — ABNORMAL HIGH (ref 0.00–1.49)

## 2013-03-25 LAB — HEPARIN LEVEL (UNFRACTIONATED): Heparin Unfractionated: 0.56 IU/mL (ref 0.30–0.70)

## 2013-03-25 MED ORDER — WARFARIN SODIUM 10 MG PO TABS
10.0000 mg | ORAL_TABLET | ORAL | Status: AC
  Administered 2013-03-25: 10 mg via ORAL
  Filled 2013-03-25: qty 1

## 2013-03-25 NOTE — Progress Notes (Signed)
Discharge instructions given and reviewed with patient. Patient discharged home with family.

## 2013-03-25 NOTE — Progress Notes (Signed)
    Subjective:  Denies CP or dyspnea   Objective:  Filed Vitals:   03/24/13 2000 03/24/13 2150 03/25/13 0000 03/25/13 0322  BP:  111/61 94/47 99/51   Pulse:      Temp:   97.6 F (36.4 C) 97.8 F (36.6 C)  TempSrc:   Oral Oral  Resp: 17  28 13   Height:      Weight:      SpO2:   97% 94%    Intake/Output from previous day:  Intake/Output Summary (Last 24 hours) at 03/25/13 0741 Last data filed at 03/25/13 1610  Gross per 24 hour  Intake    933 ml  Output   2250 ml  Net  -1317 ml    Physical Exam: Physical exam: Well-developed well-nourished in no acute distress.  Skin is warm and dry.  HEENT is normal.  Neck is supple.  Chest is clear to auscultation with normal expansion.  Cardiovascular exam is regular rate and rhythm. Crisp mechanical valve sound Abdominal exam nontender or distended. No masses palpated. Extremities show no edema. neuro grossly intact    Lab Results: Basic Metabolic Panel:  Recent Labs  96/04/54 2236 03/24/13 0745  NA 139 136  K 3.8 3.8  CL 104 102  CO2 26 28  GLUCOSE 117* 109*  BUN 15 11  CREATININE 0.99 0.95  CALCIUM 9.3 8.8   CBC:  Recent Labs  03/24/13 0745 03/25/13 0236  WBC 4.6 5.4  HGB 13.1 13.0  HCT 35.9* 35.7*  MCV 78.2 78.1  PLT 141* 157   Cardiac Enzymes:  Recent Labs  03/24/13 0253 03/24/13 0845 03/24/13 1500  TROPONINI <0.30 <0.30 <0.30     Assessment/Plan:  1 CP-symptoms atypical and most likely muskuloskeletal. Enzymes neg; CT shows no change in aortic root; echo shows normal LV function and no WMA. No plans for further cardiac eval. 2 S/P AVR-resume coumadin 3 HTN - continue present meds Cardiology will sign off. Please call with question. Patient can fu with me for cardiac issues in approximately 6-8 weeks.  [name] 03/25/2013, 7:41 AM

## 2013-03-25 NOTE — Progress Notes (Signed)
ANTICOAGULATION CONSULT NOTE - Follow up Consult  Pharmacy Consult for Heparin Indication: h/o AVR, afib, subtherapeutic INR  Allergies  Allergen Reactions  . Insulin Glargine     edema    Patient Measurements: Height: 6\' 2"  (188 cm) Weight: 304 lb 14.3 oz (138.3 kg) IBW/kg (Calculated) : 82.2 Heparin Dosing Weight: ~113kg  Vital Signs: Temp: 98.6 F (37 C) (11/03 0838) Temp src: Oral (11/03 0838) BP: 105/78 mmHg (11/03 0838) Pulse Rate: 83 (11/03 0838)  Labs:  Recent Labs  03/23/13 2236 03/23/13 2344 03/24/13 0253 03/24/13 0745 03/24/13 0845  03/24/13 1500 03/24/13 1834 03/25/13 0236 03/25/13 0853  HGB 13.9  --   --  13.1  --   --   --   --  13.0  --   HCT 37.9*  --   --  35.9*  --   --   --   --  35.7*  --   PLT 150  --   --  141*  --   --   --   --  157  --   LABPROT  --  17.2*  --   --   --   --   --   --  19.6*  --   INR  --  1.44  --   --   --   --   --   --  1.71*  --   HEPARINUNFRC  --   --   --   --   --   < >  --  0.28* 0.54 0.56  CREATININE 0.99  --   --  0.95  --   --   --   --   --   --   TROPONINI  --   --  <0.30  --  <0.30  --  <0.30  --   --   --   < > = values in this interval not displayed.  Medical History: Past Medical History  Diagnosis Date  . Diabetes mellitus   . Hypertension   . Unspecified vitamin D deficiency 08/07/2008  . HYPERLIPIDEMIA 08/20/2007  . CAD 05/21/2007  . Aortic valve disorders 09/30/2009  . Gross hematuria 08/07/2008  . TRANSIENT ISCHEMIC ATTACK, HX OF 12/26/2007  . NEPHROLITHIASIS, HX OF 05/21/2007  . Atrial fibrillation 07/27/2010    Medications:  Warfarin PTA Dose: 10 mg Thurs/Sun, 5 mg all other days . heparin 1,950 Units/hr (03/25/13 0524)     Assessment: 47 y/o M with hx of afib, AVR on warfarin PTA. INR on 10/21 per [facility]ain clinic notes was 2, but INR 11/1 is SUB-therapeutic at 1.44. CBC/renal function stable. No bleeding noted.   Today's INR 1.71; heparin level at goal at 0.54.  Planning d/c home today  without bridging.  Goal of Therapy:  INR 2-3 Heparin level 0.3-0.7 units/ml Monitor platelets by anticoagulation protocol: Yes   Plan:  1.  Will give Coumadin 10 mg x 1 before discharge. 2. Then recommend resuming Coumadin at previous home dose. 3. Continue heparin at current rate until discharge.  Tad Moore, BCPS  Clinical Pharmacist Pager [phone]  03/25/2013 11:52 AM

## 2013-03-25 NOTE — Discharge Summary (Signed)
Physician Discharge Summary  [name] ZOX:[id number] DOB: 1965/07/10 DOA: 03/23/2013  PCP: [name], MD  Admit date: 03/23/2013 Discharge date: 03/25/2013  Time spent: Less than 30 minutes  Recommendations for Outpatient Follow-up:  1. [facility] on 03/27/2013 @ 8:15AM. 2. Dr. [name], PCP in 1 week. 3. Dr. [name], Cardiology in 6-8 weeks.  Discharge Diagnoses:  Principal Problem:   ACS (acute coronary syndrome) Active Problems:   DIABETES MELLITUS, TYPE II   HYPERLIPIDEMIA   HYPERTENSION   CAD   Aortic valve disorders   Precordial chest pain   Discharge Condition: Improved & Stable  Diet recommendation: Heart Healthy & Diabetic Diet.  Filed Weights   03/24/13 0408  Weight: 138.3 kg (304 lb 14.3 oz)    History of present illness:  47 year old male with history of CAD, DM 2, HL, spontaneous AA dissection with AV involvement, S/P mechanical aVR on Coumadin anticoagulation,HTN, A. fib, TIA, morbid obesity presented to the ED with complaints of chest pain. Patient states that he recently completed outpatient echo a week ago and his last stress test was approximately a year ago. In the ED, troponin negative, CTA negative for PE and subtherapeutic INR. Patient admitted for further evaluation.  Hospital Course:   Chest pain/CAD  - Atypical, reproducible to palpation and seems musculoskeletal in etiology  - Troponins x2 negative  - Cardiology consulted, determined CP non cardiac in nature and cleared him for DC.  Hypertension  - Mildly uncontrolled  - Currently on amlodipine, clonidine, metoprolol and losartan.  - Continue home medications on DC  Type II DM   - Resume oral hypoglycemics - A1C: 6.8  Mechanical aVR  - Subtherapeutic INR on admission-1.44.  - Target INR: 2.5-3.5  - Given slightly higher dose on day of DC and arranged early FU with Coumadin clinic.  Hyperlipidemia  - Continue statins   Obesity   Mild thrombocytopenia   - resolved  Consultations:  Cardiology  Procedures:  None    Discharge Exam:  Complaints:  No chest pains. Denies any complaints.  Filed Vitals:   03/24/13 2150 03/25/13 0000 03/25/13 0322 03/25/13 0838  BP: 111/61 94/47 99/51  105/78  Pulse:    83  Temp:  97.6 F (36.4 C) 97.8 F (36.6 C) 98.6 F (37 C)  TempSrc:  Oral Oral Oral  Resp:  28 13 19   Height:      Weight:      SpO2:  97% 94% 98%    General exam: young male, moderately built and morbidly obese lying comfortably supine in bed.  Respiratory system: Clear. No increased work of breathing. Reproducible left chest wall tenderness - resolved.  Cardiovascular system: S1 & S2 heard, RRR. No JVD, murmurs, gallops, clicks or pedal edema. Telemetry: Sinus rhythm  Gastrointestinal system: Abdomen is nondistended, soft and nontender. Normal bowel sounds heard.  Central nervous system: Alert and oriented. No focal neurological deficits.  Extremities: Symmetric 5 x 5 power.   Discharge Instructions      Discharge Orders   Future Appointments Provider Department Dept Phone   03/27/2013 8:15 AM [name] [place] Office [phone]   05/06/2013 10:15 AM [name], MD [name] [phone]   Future Orders Complete By Expires   Call MD for:  difficulty breathing, headache or visual disturbances  As directed    Call MD for:  severe uncontrolled pain  As directed    Diet - low sodium heart healthy  As  directed    Diet Carb Modified  As directed    Discharge instructions  As directed    Comments:     Do NOT take Metformin today. May start taking from 03/26/2013.   Increase activity slowly  As directed        Medication List         amLODipine 10 MG tablet  Commonly known as:  NORVASC  take 1 tablet by mouth once daily for high blood pressure     aspirin 81 MG EC tablet  Take 81 mg by mouth daily.     cloNIDine 0.1 MG tablet  Commonly known as:   CATAPRES  take 1 tablet by mouth twice a day     furosemide 40 MG tablet  Commonly known as:  LASIX  take 1 tablet by mouth once daily     glimepiride 1 MG tablet  Commonly known as:  AMARYL  take 1 tablet by mouth once daily before BREAKFAST     losartan 100 MG tablet  Commonly known as:  COZAAR  take 1 tablet by mouth once daily     metFORMIN 1000 MG tablet  Commonly known as:  GLUCOPHAGE  take 1 tablet by mouth twice a day with food     metoprolol 200 MG 24 hr tablet  Commonly known as:  TOPROL-XL  Take 200 mg by mouth daily.     potassium chloride SA 20 MEQ tablet  Commonly known as:  K-DUR,KLOR-CON  Take 20 mEq by mouth 2 (two) times daily.     pravastatin 40 MG tablet  Commonly known as:  PRAVACHOL  Take 40 mg by mouth daily.     Vitamin D3 1000 UNITS Caps  Take 1,000 Units by mouth daily.     warfarin 5 MG tablet  Commonly known as:  COUMADIN  Take 5-10 mg by mouth daily. Thursday and Sunday take 10mg  (2 tablets) and 5mg  (1 tablet) all other days.       Follow-up Information   Follow up with [name] On 03/27/2013. (8:15 AM)    Contact information:   [address] [place] [address]       Follow up with [name], MD. Schedule an appointment as soon as possible for a visit in 1 week.   Specialties:  Internal Medicine, Radiology   Contact information:   [address] [place] [place] [address] [phone]       Follow up with [name], MD. Schedule an appointment as soon as possible for a visit in 6 weeks.   Specialty:  Cardiology   Contact information:   [address] [place] [address] [phone]        The results of significant diagnostics from this hospitalization (including imaging, microbiology, ancillary and laboratory) are listed below for reference.    Significant Diagnostic Studies: Dg Chest 2 View  03/23/2013   CLINICAL DATA:  Non radiating sharp left chest pain  intermittently over past few hr, history AVR, coronary artery disease, hypertension, diabetes  EXAM: CHEST  2 VIEW  COMPARISON:  04/09/2012  FINDINGS: Enlargement of cardiac silhouette post median sternotomy and AVR.  Pulmonary vascular congestion.  Mediastinal contours normal.  No gross failure or consolidation.  No pleural effusion or pneumothorax.  Bones unremarkable.  IMPRESSION: Enlargement of cardiac silhouette post AVR with pulmonary vascular congestion.  No acute abnormalities.   Electronically Signed   By: Angelyn Punt.D.  On: 03/23/2013 23:10   Ct Angio Chest Pe W/cm &/or Wo Cm  03/24/2013   CLINICAL DATA:  Chest pain, history of repaired thoracic aortic dissection  EXAM: CT ANGIOGRAPHY CHEST WITH CONTRAST  TECHNIQUE: Multidetector CT imaging of the chest was performed using the standard protocol during bolus administration of intravenous contrast. Multiplanar CT image reconstructions including MIPs were obtained to evaluate the vascular anatomy.  CONTRAST:  100 cc Omnipaque 350 IV  COMPARISON:  05/26/2010  FINDINGS: Thoracic aortic dissection is identified at the arch extending the into the distal ascending thoracic aorta.  This appears to been present since an earlier exam of 12/31/2009 though is better opacified on the current study.  The aorta is mildly dilated at this level and shows evidence of surgical repair; this likely represent a Type A dissection which has undergone prior surgical repair.  Postsurgical changes of median sternotomy and aortic valve replacement noted.  Distal thoracic aortic arch and descending thoracic aorta show no evidence of dissection.  Pulmonary arteries patent.  No evidence of pulmonary embolism.  Scattered respiratory motion artifacts at lung bases.  Visualized portion of upper abdomen significant only for probable cholelithiasis.  Lungs clear.  No pleural effusion or pneumothorax.  No acute osseous findings.  Review of the MIP images confirms the above findings.   IMPRESSION: No evidence of pulmonary embolism.  Postsurgical changes of aortic valve replacement and what is likely prior repair of aType A aortic dissection, though an intimal flap with fenestration is again seen at the mildly dilated proximal aortic arch.  Appearance is grossly unchanged since 12/31/2009.   Electronically Signed   By: Ulyses Southward M.D.   On: 03/24/2013 01:26    Microbiology: Recent Results (from the past 240 hour(s))  MRSA PCR SCREENING     Status: None   Collection Time    03/24/13  3:50 AM      Result Value Range Status   MRSA by PCR NEGATIVE  NEGATIVE Final   Comment:            The GeneXpert MRSA Assay (FDA     approved for NASAL specimens     only), is one component of a     comprehensive MRSA colonization     surveillance program. It is not     intended to diagnose MRSA     infection nor to guide or     monitor treatment for     MRSA infections.     Labs: Basic Metabolic Panel:  Recent Labs Lab 03/23/13 2236 03/24/13 0745  NA 139 136  K 3.8 3.8  CL 104 102  CO2 26 28  GLUCOSE 117* 109*  BUN 15 11  CREATININE 0.99 0.95  CALCIUM 9.3 8.8   Liver Function Tests: No results found for this basename: AST, ALT, ALKPHOS, BILITOT, PROT, ALBUMIN,  in the last 168 hours No results found for this basename: LIPASE, AMYLASE,  in the last 168 hours No results found for this basename: AMMONIA,  in the last 168 hours CBC:  Recent Labs Lab 03/23/13 2236 03/24/13 0745 03/25/13 0236  WBC 6.0 4.6 5.4  HGB 13.9 13.1 13.0  HCT 37.9* 35.9* 35.7*  MCV 79.6 78.2 78.1  PLT 150 141* 157   Cardiac Enzymes:  Recent Labs Lab 03/24/13 0253 03/24/13 0845 03/24/13 1500  TROPONINI <0.30 <0.30 <0.30   BNP: BNP (last 3 results) No results found for this basename: PROBNP,  in the last 8760 hours CBG:  Recent Labs Lab  03/24/13 0926 03/24/13 1153 03/24/13 1707 03/24/13 2210 03/25/13 0841  GLUCAP 105* 117* 104* 125* 154*      Signed:  Marcellus Scott,  MD, FACP, FHM. [facility] Pager [phone]  If 7PM-7AM, please contact night-coverage [email or web address] Password [facility] 03/25/2013, 10:44 AM

## 2013-03-25 NOTE — Progress Notes (Signed)
ANTICOAGULATION CONSULT NOTE  Pharmacy Consult for Heparin/Warfarin  Indication: h/o AVR, afib, subtherapeutic INR  Allergies  Allergen Reactions  . Insulin Glargine     edema    Patient Measurements: Height: 6\' 2"  (188 cm) Weight: 304 lb 14.3 oz (138.3 kg) IBW/kg (Calculated) : 82.2 Heparin Dosing Weight: ~113kg  Vital Signs: Temp: 97.8 F (36.6 C) (11/03 0322) Temp src: Oral (11/03 0322) BP: 99/51 mmHg (11/03 0322) Pulse Rate: 58 (11/02 1600)  Labs:  Recent Labs  03/23/13 2236 03/23/13 2344 03/24/13 0253 03/24/13 0745 03/24/13 0845 03/24/13 1000 03/24/13 1500 03/24/13 1834 03/25/13 0236  HGB 13.9  --   --  13.1  --   --   --   --  13.0  HCT 37.9*  --   --  35.9*  --   --   --   --  35.7*  PLT 150  --   --  141*  --   --   --   --  157  LABPROT  --  17.2*  --   --   --   --   --   --  19.6*  INR  --  1.44  --   --   --   --   --   --  1.71*  HEPARINUNFRC  --   --   --   --   --  0.26*  --  0.28* 0.54  CREATININE 0.99  --   --  0.95  --   --   --   --   --   TROPONINI  --   --  <0.30  --  <0.30  --  <0.30  --   --     Medical History: Past Medical History  Diagnosis Date  . Diabetes mellitus   . Hypertension   . Unspecified vitamin D deficiency 08/07/2008  . HYPERLIPIDEMIA 08/20/2007  . CAD 05/21/2007  . Aortic valve disorders 09/30/2009  . Gross hematuria 08/07/2008  . TRANSIENT ISCHEMIC ATTACK, HX OF 12/26/2007  . NEPHROLITHIASIS, HX OF 05/21/2007  . Atrial fibrillation 07/27/2010    Medications:  Warfarin PTA Dose: 10 mg Thurs/Sun, 5 mg all other days  Assessment: 47 y/o M with hx of afib, AVR on warfarin PTA. Bridging with heparin 2/2 sub-therapeutic INR (1.71<1.44). HL is 0.54<0.28. CBC/renal function stable.   Goal of Therapy:  INR 2-3 Heparin level 0.3-0.7 units/ml Monitor platelets by anticoagulation protocol: Yes   Plan:  -Continue heparin drip at 1950 units/hr -6 hour HL at 0830 to cofirm -Daily CBC/HL -Monitor for bleeding -DC heparin  drip when INR >2  Thank you for allowing me to take part in this patient's care,  Abran Duke, PharmD Clinical Pharmacist Phone: [phone] Pager: [phone] 03/25/2013 3:34 AM

## 2013-03-27 ENCOUNTER — Ambulatory Visit (INDEPENDENT_AMBULATORY_CARE_PROVIDER_SITE_OTHER): Payer: Medicare Other | Admitting: *Deleted

## 2013-03-27 DIAGNOSIS — I4891 Unspecified atrial fibrillation: Secondary | ICD-10-CM | POA: Diagnosis not present

## 2013-03-27 DIAGNOSIS — I359 Nonrheumatic aortic valve disorder, unspecified: Secondary | ICD-10-CM | POA: Diagnosis not present

## 2013-03-27 DIAGNOSIS — Z8679 Personal history of other diseases of the circulatory system: Secondary | ICD-10-CM | POA: Diagnosis not present

## 2013-03-27 DIAGNOSIS — G459 Transient cerebral ischemic attack, unspecified: Secondary | ICD-10-CM

## 2013-03-27 LAB — POCT INR: INR: 2.3

## 2013-03-28 ENCOUNTER — Other Ambulatory Visit: Payer: Self-pay

## 2013-04-07 ENCOUNTER — Other Ambulatory Visit: Payer: Self-pay | Admitting: Cardiology

## 2013-04-10 ENCOUNTER — Ambulatory Visit (INDEPENDENT_AMBULATORY_CARE_PROVIDER_SITE_OTHER): Payer: Medicare Other | Admitting: *Deleted

## 2013-04-10 DIAGNOSIS — Z8679 Personal history of other diseases of the circulatory system: Secondary | ICD-10-CM | POA: Diagnosis not present

## 2013-04-10 DIAGNOSIS — G459 Transient cerebral ischemic attack, unspecified: Secondary | ICD-10-CM

## 2013-04-10 DIAGNOSIS — I4891 Unspecified atrial fibrillation: Secondary | ICD-10-CM

## 2013-04-10 DIAGNOSIS — I359 Nonrheumatic aortic valve disorder, unspecified: Secondary | ICD-10-CM

## 2013-04-10 LAB — POCT INR: INR: 2.2

## 2013-05-06 ENCOUNTER — Ambulatory Visit: Payer: Medicare Other | Admitting: Internal Medicine

## 2013-05-08 ENCOUNTER — Ambulatory Visit (INDEPENDENT_AMBULATORY_CARE_PROVIDER_SITE_OTHER): Payer: Medicare Other | Admitting: *Deleted

## 2013-05-08 DIAGNOSIS — I4891 Unspecified atrial fibrillation: Secondary | ICD-10-CM

## 2013-05-08 DIAGNOSIS — G459 Transient cerebral ischemic attack, unspecified: Secondary | ICD-10-CM

## 2013-05-08 DIAGNOSIS — I359 Nonrheumatic aortic valve disorder, unspecified: Secondary | ICD-10-CM | POA: Diagnosis not present

## 2013-05-08 DIAGNOSIS — Z8679 Personal history of other diseases of the circulatory system: Secondary | ICD-10-CM

## 2013-05-09 ENCOUNTER — Ambulatory Visit (INDEPENDENT_AMBULATORY_CARE_PROVIDER_SITE_OTHER): Payer: Medicare Other | Admitting: Internal Medicine

## 2013-05-09 ENCOUNTER — Other Ambulatory Visit (INDEPENDENT_AMBULATORY_CARE_PROVIDER_SITE_OTHER): Payer: Medicare Other

## 2013-05-09 ENCOUNTER — Encounter: Payer: Self-pay | Admitting: Internal Medicine

## 2013-05-09 VITALS — BP 138/80 | HR 68 | Temp 97.6°F | Ht 74.0 in | Wt 308.1 lb

## 2013-05-09 DIAGNOSIS — I10 Essential (primary) hypertension: Secondary | ICD-10-CM

## 2013-05-09 DIAGNOSIS — E119 Type 2 diabetes mellitus without complications: Secondary | ICD-10-CM | POA: Diagnosis not present

## 2013-05-09 DIAGNOSIS — IMO0001 Reserved for inherently not codable concepts without codable children: Secondary | ICD-10-CM | POA: Diagnosis not present

## 2013-05-09 DIAGNOSIS — Z23 Encounter for immunization: Secondary | ICD-10-CM | POA: Diagnosis not present

## 2013-05-09 DIAGNOSIS — E785 Hyperlipidemia, unspecified: Secondary | ICD-10-CM | POA: Diagnosis not present

## 2013-05-09 LAB — HEPATIC FUNCTION PANEL
ALT: 16 U/L (ref 0–53)
AST: 22 U/L (ref 0–37)
Bilirubin, Direct: 0.2 mg/dL (ref 0.0–0.3)
Total Bilirubin: 0.9 mg/dL (ref 0.3–1.2)
Total Protein: 8.1 g/dL (ref 6.0–8.3)

## 2013-05-09 LAB — MICROALBUMIN / CREATININE URINE RATIO: Microalb, Ur: 44.9 mg/dL — ABNORMAL HIGH (ref 0.0–1.9)

## 2013-05-09 LAB — LIPID PANEL
Cholesterol: 153 mg/dL (ref 0–200)
HDL: 31.2 mg/dL — ABNORMAL LOW (ref >39.00)
LDL Cholesterol: 84 mg/dL (ref 0–99)
Triglycerides: 187 mg/dL — ABNORMAL HIGH (ref 0.0–149.0)

## 2013-05-09 LAB — CBC WITH DIFFERENTIAL/PLATELET
Basophils Absolute: 0 10*3/uL (ref 0.0–0.1)
Basophils Relative: 0.3 % (ref 0.0–3.0)
Eosinophils Absolute: 0.3 10*3/uL (ref 0.0–0.7)
Eosinophils Relative: 4.2 % (ref 0.0–5.0)
Lymphocytes Relative: 28.1 % (ref 12.0–46.0)
MCHC: 34.4 g/dL (ref 30.0–36.0)
Monocytes Absolute: 0.6 10*3/uL (ref 0.1–1.0)
Neutrophils Relative %: 58.1 % (ref 43.0–77.0)
RBC: 5.16 Mil/uL (ref 4.22–5.81)
WBC: 6.3 10*3/uL (ref 4.5–10.5)

## 2013-05-09 LAB — URINALYSIS, ROUTINE W REFLEX MICROSCOPIC
Hgb urine dipstick: NEGATIVE
Leukocytes, UA: NEGATIVE
Nitrite: NEGATIVE
Total Protein, Urine: 100 — AB
pH: 7 (ref 5.0–8.0)

## 2013-05-09 LAB — BASIC METABOLIC PANEL
BUN: 14 mg/dL (ref 6–23)
CO2: 30 mEq/L (ref 19–32)
Calcium: 9.1 mg/dL (ref 8.4–10.5)
Chloride: 105 mEq/L (ref 96–112)
Creatinine, Ser: 1 mg/dL (ref 0.4–1.5)
Potassium: 3.8 mEq/L (ref 3.5–5.1)

## 2013-05-09 NOTE — Patient Instructions (Addendum)
You had the flu shot today Please continue all other medications as before, and refills have been done if requested. Please have the pharmacy call with any other refills you may need. Please continue your efforts at being more active, low cholesterol diet, and weight control. You are otherwise up to date with prevention measures today. Please go to the LAB in the Basement (turn left off the elevator) for the tests to be done today You will be contacted by phone if any changes need to be made immediately.  Otherwise, you will receive a letter about your results with an explanation, but please check with MyChart first.  Please remember to sign up for My Chart if you have not done so, as this will be important to you in the future with finding out test results, communicating by private email, and scheduling acute appointments online when needed.

## 2013-05-09 NOTE — Progress Notes (Signed)
Subjective:    Patient ID: [name], male    DOB: 04-Mar-1966, 47 y.o.   MRN: [id number]  HPI  Here to f/u; overall doing ok,  Pt denies chest pain, increased sob or doe, wheezing, orthopnea, PND, increased LE swelling, palpitations, dizziness or syncope.  Pt denies polydipsia, polyuria, or low sugar symptoms such as weakness or confusion improved with po intake.  Pt denies new neurological symptoms such as new headache, or facial or extremity weakness or numbness.   Pt states overall good compliance with meds, has been trying to follow lower cholesterol, diabetic diet, with wt overall stable,  but little exercise however. Goes to [facility] 3-4 times per wk, 2 hrs each time, plans to do more if possible.  Past Medical History  Diagnosis Date  . Diabetes mellitus   . Hypertension   . Unspecified vitamin D deficiency 08/07/2008  . HYPERLIPIDEMIA 08/20/2007  . CAD 05/21/2007  . Aortic valve disorders 09/30/2009  . Gross hematuria 08/07/2008  . TRANSIENT ISCHEMIC ATTACK, HX OF 12/26/2007  . NEPHROLITHIASIS, HX OF 05/21/2007  . Atrial fibrillation 07/27/2010   Past Surgical History  Procedure Laterality Date  . Appendectomy  1998  . Coronary artery bypass graft  2000    reports that he has never smoked. He does not have any smokeless tobacco history on file. He reports that he does not drink alcohol or use illicit drugs. family history includes Coronary artery disease in his mother; Coronary artery disease (age of onset: 56) in his other; Diabetes in his brother; Hypertension in his mother. Allergies  Allergen Reactions  . Insulin Glargine     edema   Current Outpatient Prescriptions on File Prior to Visit  Medication Sig Dispense Refill  . amLODipine (NORVASC) 10 MG tablet take 1 tablet by mouth once daily for high blood pressure  30 tablet  5  . aspirin 81 MG EC tablet Take 81 mg by mouth daily.        . Cholecalciferol (VITAMIN D3) 1000 UNITS CAPS Take 1,000 Units by mouth daily.       .  cloNIDine (CATAPRES) 0.1 MG tablet take 1 tablet by mouth twice a day  60 tablet  0  . furosemide (LASIX) 40 MG tablet take 1 tablet by mouth once daily  30 tablet  5  . glimepiride (AMARYL) 1 MG tablet take 1 tablet by mouth once daily before BREAKFAST  90 tablet  1  . losartan (COZAAR) 100 MG tablet take 1 tablet by mouth once daily  30 tablet  6  . metFORMIN (GLUCOPHAGE) 1000 MG tablet take 1 tablet by mouth twice a day with food  60 tablet  11  . metoprolol (TOPROL-XL) 200 MG 24 hr tablet Take 200 mg by mouth daily.        . potassium chloride SA (K-DUR,KLOR-CON) 20 MEQ tablet Take 20 mEq by mouth 2 (two) times daily.      . pravastatin (PRAVACHOL) 40 MG tablet Take 40 mg by mouth daily.      [name] warfarin (COUMADIN) 5 MG tablet Take 5-10 mg by mouth daily. Thursday and Sunday take 10mg  (2 tablets) and 5mg  (1 tablet) all other days.       No current facility-administered medications on file prior to visit.   Review of Systems  Constitutional: Negative for unexpected weight change, or unusual diaphoresis  HENT: Negative for tinnitus.   Eyes: Negative for photophobia and visual disturbance.  Respiratory: Negative for choking and stridor.  Gastrointestinal: Negative for vomiting and blood in stool.  Genitourinary: Negative for hematuria and decreased urine volume.  Musculoskeletal: Negative for acute joint swelling Skin: Negative for color change and wound.  Neurological: Negative for tremors and numbness other than noted  Psychiatric/Behavioral: Negative for decreased concentration or  hyperactivity.       Objective:   Physical Exam BP 138/80  Pulse 68  Temp(Src) 97.6 F (36.4 C) (Oral)  Ht 6\' 2"  (1.88 m)  Wt 308 lb 2 oz (139.765 kg)  BMI 39.54 kg/m2  SpO2 97% VS noted,  Constitutional: Pt appears well-developed and well-nourished.  HENT: Head: NCAT.  Right Ear: External ear normal.  Left Ear: External ear normal.  Eyes: Conjunctivae and EOM are normal. Pupils are equal,  round, and reactive to light.  Neck: Normal range of motion. Neck supple.  Cardiovascular: Normal rate and regular rhythm.   Pulmonary/Chest: Effort normal and breath sounds normal.  Abd:  Soft, NT, non-distended, + BS Neurological: Pt is alert. Not confused  Skin: Skin is warm. No erythema.  Psychiatric: Pt behavior is normal. Thought content normal.     Assessment & Plan:

## 2013-05-09 NOTE — Progress Notes (Signed)
Pre-visit discussion using our clinic review tool. No additional management support is needed unless otherwise documented below in the visit note.  

## 2013-05-15 NOTE — Assessment & Plan Note (Signed)
stable overall by history and exam, recent data reviewed with pt, and pt to continue medical treatment as before,  to f/u any worsening symptoms or concerns Lab Results  Component Value Date   HGBA1C 6.8* 03/24/2013    

## 2013-05-15 NOTE — Assessment & Plan Note (Signed)
stable overall by history and exam, recent data reviewed with pt, and pt to continue medical treatment as before,  to f/u any worsening symptoms or concerns BP Readings from Last 3 Encounters:  05/09/13 138/80  03/25/13 105/78  11/02/12 132/90

## 2013-05-15 NOTE — Assessment & Plan Note (Signed)
stable overall by history and exam, recent data reviewed with pt, and pt to continue medical treatment as before,  to f/u any worsening symptoms or concerns Lab Results  Component Value Date   LDLCALC 84 05/09/2013

## 2013-05-30 ENCOUNTER — Other Ambulatory Visit: Payer: Self-pay | Admitting: *Deleted

## 2013-05-30 MED ORDER — WARFARIN SODIUM 5 MG PO TABS: ORAL_TABLET | ORAL | Status: DC

## 2013-06-07 ENCOUNTER — Ambulatory Visit (INDEPENDENT_AMBULATORY_CARE_PROVIDER_SITE_OTHER): Payer: Medicare Other | Admitting: *Deleted

## 2013-06-07 DIAGNOSIS — I4891 Unspecified atrial fibrillation: Secondary | ICD-10-CM

## 2013-06-07 DIAGNOSIS — I359 Nonrheumatic aortic valve disorder, unspecified: Secondary | ICD-10-CM | POA: Diagnosis not present

## 2013-06-07 DIAGNOSIS — Z8679 Personal history of other diseases of the circulatory system: Secondary | ICD-10-CM | POA: Diagnosis not present

## 2013-06-07 DIAGNOSIS — G459 Transient cerebral ischemic attack, unspecified: Secondary | ICD-10-CM | POA: Diagnosis not present

## 2013-06-07 LAB — POCT INR: INR: 1.9

## 2013-06-21 ENCOUNTER — Ambulatory Visit (INDEPENDENT_AMBULATORY_CARE_PROVIDER_SITE_OTHER): Payer: Medicare Other

## 2013-06-21 DIAGNOSIS — Z8679 Personal history of other diseases of the circulatory system: Secondary | ICD-10-CM | POA: Diagnosis not present

## 2013-06-21 DIAGNOSIS — I4891 Unspecified atrial fibrillation: Secondary | ICD-10-CM | POA: Diagnosis not present

## 2013-06-21 DIAGNOSIS — Z5181 Encounter for therapeutic drug level monitoring: Secondary | ICD-10-CM

## 2013-06-21 DIAGNOSIS — G459 Transient cerebral ischemic attack, unspecified: Secondary | ICD-10-CM

## 2013-06-21 DIAGNOSIS — I359 Nonrheumatic aortic valve disorder, unspecified: Secondary | ICD-10-CM | POA: Diagnosis not present

## 2013-06-21 LAB — POCT INR: INR: 2.1

## 2013-06-25 ENCOUNTER — Emergency Department (HOSPITAL_COMMUNITY): Payer: Medicare Other

## 2013-06-25 ENCOUNTER — Emergency Department (HOSPITAL_COMMUNITY)
Admission: EM | Admit: 2013-06-25 | Discharge: 2013-06-25 | Disposition: A | Discharge status: Home / Self Care | Payer: Medicare Other | Attending: Emergency Medicine | Admitting: Emergency Medicine

## 2013-06-25 ENCOUNTER — Encounter (HOSPITAL_COMMUNITY): Payer: Self-pay | Admitting: Emergency Medicine

## 2013-06-25 DIAGNOSIS — Z87442 Personal history of urinary calculi: Secondary | ICD-10-CM | POA: Diagnosis not present

## 2013-06-25 DIAGNOSIS — E785 Hyperlipidemia, unspecified: Secondary | ICD-10-CM | POA: Insufficient documentation

## 2013-06-25 DIAGNOSIS — I4891 Unspecified atrial fibrillation: Secondary | ICD-10-CM | POA: Diagnosis not present

## 2013-06-25 DIAGNOSIS — Z7901 Long term (current) use of anticoagulants: Secondary | ICD-10-CM | POA: Diagnosis not present

## 2013-06-25 DIAGNOSIS — Z79899 Other long term (current) drug therapy: Secondary | ICD-10-CM | POA: Insufficient documentation

## 2013-06-25 DIAGNOSIS — I2510 Atherosclerotic heart disease of native coronary artery without angina pectoris: Secondary | ICD-10-CM | POA: Diagnosis not present

## 2013-06-25 DIAGNOSIS — Z951 Presence of aortocoronary bypass graft: Secondary | ICD-10-CM | POA: Insufficient documentation

## 2013-06-25 DIAGNOSIS — R079 Chest pain, unspecified: Secondary | ICD-10-CM

## 2013-06-25 DIAGNOSIS — Z7982 Long term (current) use of aspirin: Secondary | ICD-10-CM | POA: Diagnosis not present

## 2013-06-25 DIAGNOSIS — R0789 Other chest pain: Secondary | ICD-10-CM | POA: Insufficient documentation

## 2013-06-25 DIAGNOSIS — Z8673 Personal history of transient ischemic attack (TIA), and cerebral infarction without residual deficits: Secondary | ICD-10-CM | POA: Diagnosis not present

## 2013-06-25 DIAGNOSIS — E119 Type 2 diabetes mellitus without complications: Secondary | ICD-10-CM | POA: Diagnosis not present

## 2013-06-25 DIAGNOSIS — E559 Vitamin D deficiency, unspecified: Secondary | ICD-10-CM | POA: Insufficient documentation

## 2013-06-25 DIAGNOSIS — I10 Essential (primary) hypertension: Secondary | ICD-10-CM | POA: Diagnosis not present

## 2013-06-25 LAB — CBC WITH DIFFERENTIAL/PLATELET
Basophils Absolute: 0 10*3/uL (ref 0.0–0.1)
Basophils Relative: 0 % (ref 0–1)
Eosinophils Absolute: 0.3 10*3/uL (ref 0.0–0.7)
Eosinophils Relative: 6 % — ABNORMAL HIGH (ref 0–5)
HCT: 36.3 % — ABNORMAL LOW (ref 39.0–52.0)
Hemoglobin: 13 g/dL (ref 13.0–17.0)
Lymphocytes Relative: 29 % (ref 12–46)
Lymphs Abs: 1.4 10*3/uL (ref 0.7–4.0)
MCH: 28.4 pg (ref 26.0–34.0)
MCHC: 35.8 g/dL (ref 30.0–36.0)
MCV: 79.3 fL (ref 78.0–100.0)
Monocytes Absolute: 0.6 10*3/uL (ref 0.1–1.0)
Monocytes Relative: 11 % (ref 3–12)
Neutro Abs: 2.7 10*3/uL (ref 1.7–7.7)
Neutrophils Relative %: 54 % (ref 43–77)
Platelets: 160 10*3/uL (ref 150–400)
RBC: 4.58 MIL/uL (ref 4.22–5.81)
RDW: 15.1 % (ref 11.5–15.5)
WBC: 5 10*3/uL (ref 4.0–10.5)

## 2013-06-25 LAB — PROTIME-INR
INR: 1.89 — ABNORMAL HIGH (ref 0.00–1.49)
Prothrombin Time: 21.1 seconds — ABNORMAL HIGH (ref 11.6–15.2)

## 2013-06-25 LAB — TROPONIN I: Troponin I: <0.3 ng/mL (ref <0.30)

## 2013-06-25 LAB — BASIC METABOLIC PANEL
BUN: 15 mg/dL (ref 6–23)
CO2: 26 mEq/L (ref 19–32)
Calcium: 9 mg/dL (ref 8.4–10.5)
Chloride: 104 mEq/L (ref 96–112)
Creatinine, Ser: 1 mg/dL (ref 0.50–1.35)
GFR calc Af Amer: >90 mL/min (ref >90)
GFR calc non Af Amer: 87 mL/min — ABNORMAL LOW (ref >90)
Glucose, Bld: 162 mg/dL — ABNORMAL HIGH (ref 70–99)
Potassium: 4 mEq/L (ref 3.7–5.3)
Sodium: 142 mEq/L (ref 137–147)

## 2013-06-25 IMAGING — CR DG CHEST 2V
2 series · 2 of 2 positions shown · non-contrast
Comparison: March 23, 2013

CLINICAL DATA: Chest pain

EXAM:
CHEST  2 VIEW

[w chest pa]
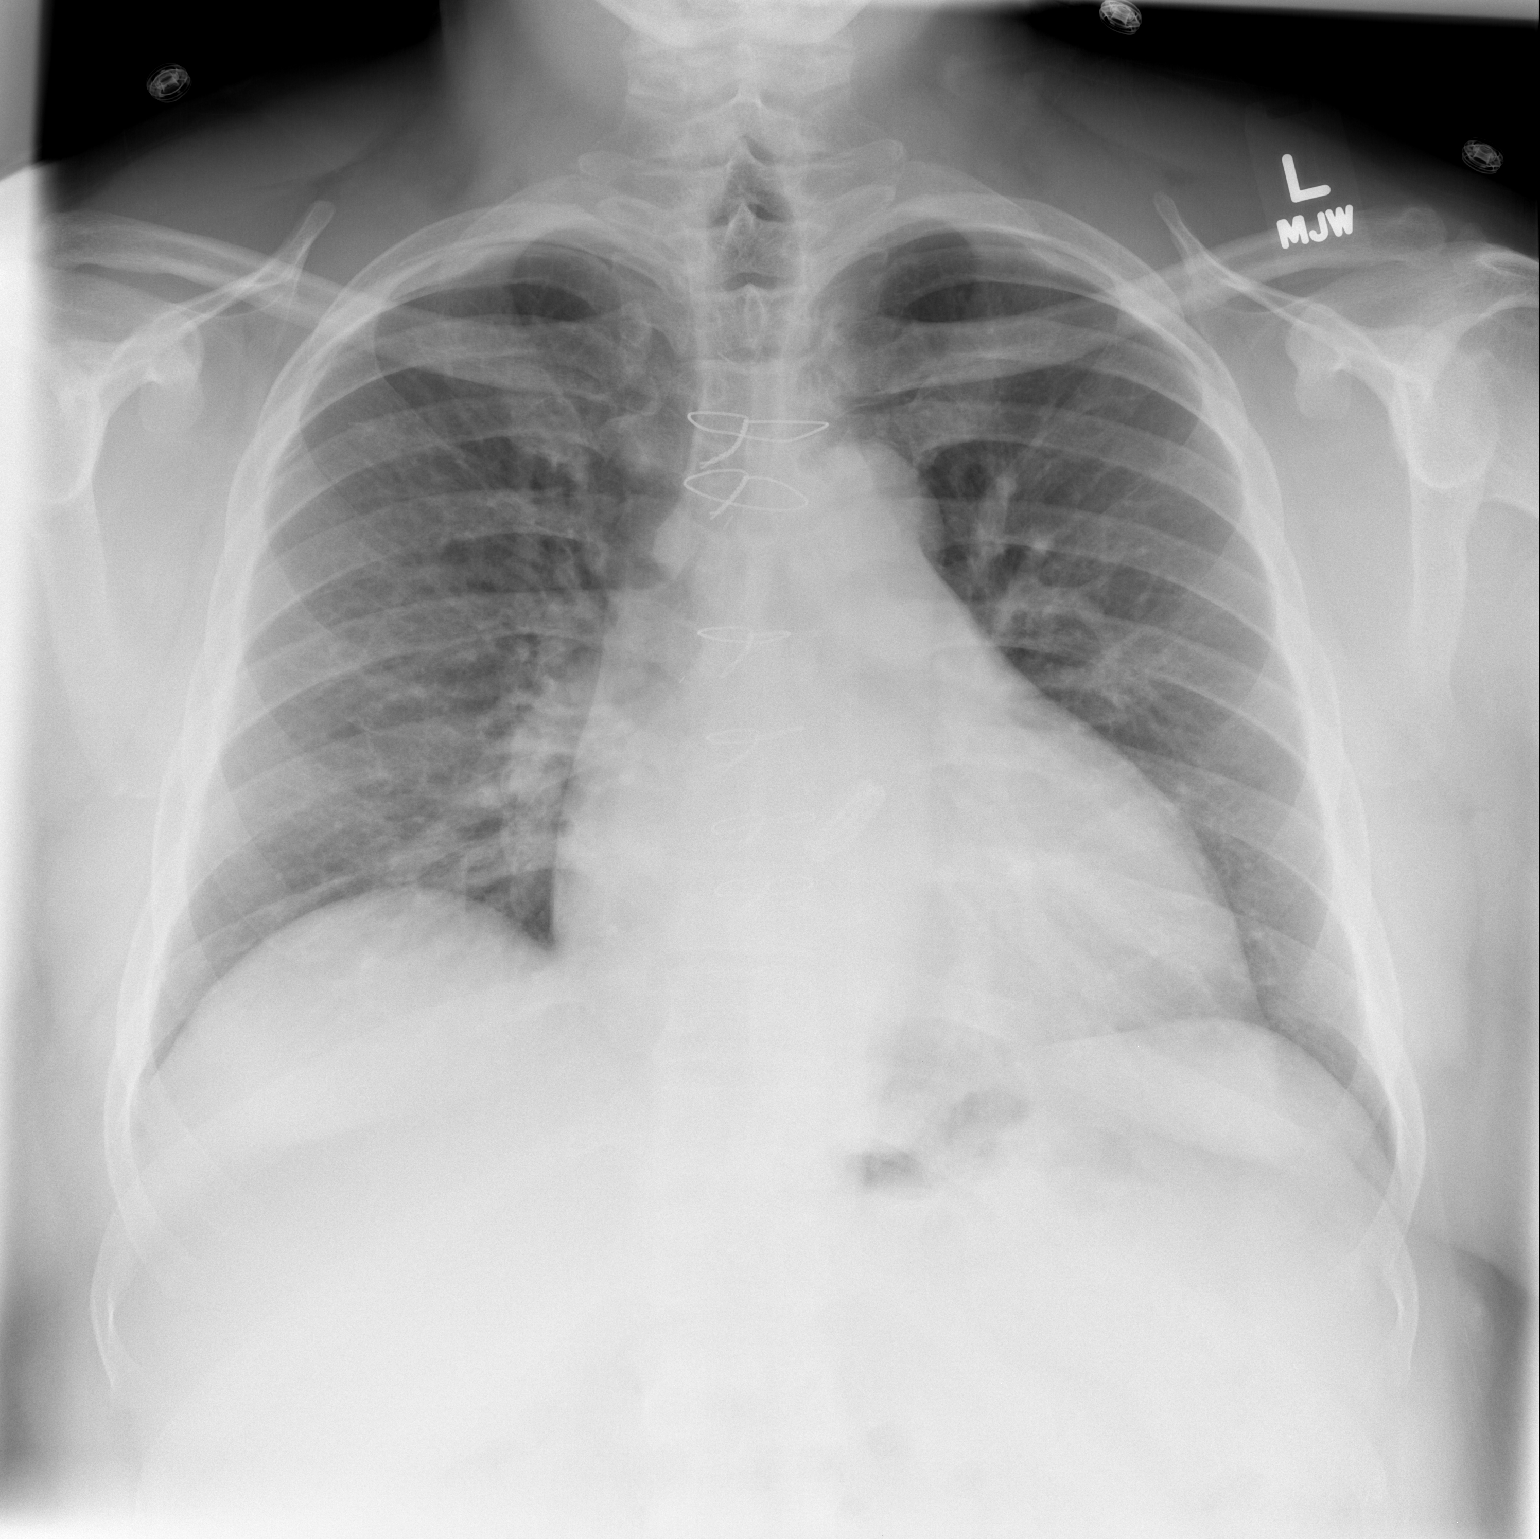

[w chest lat]
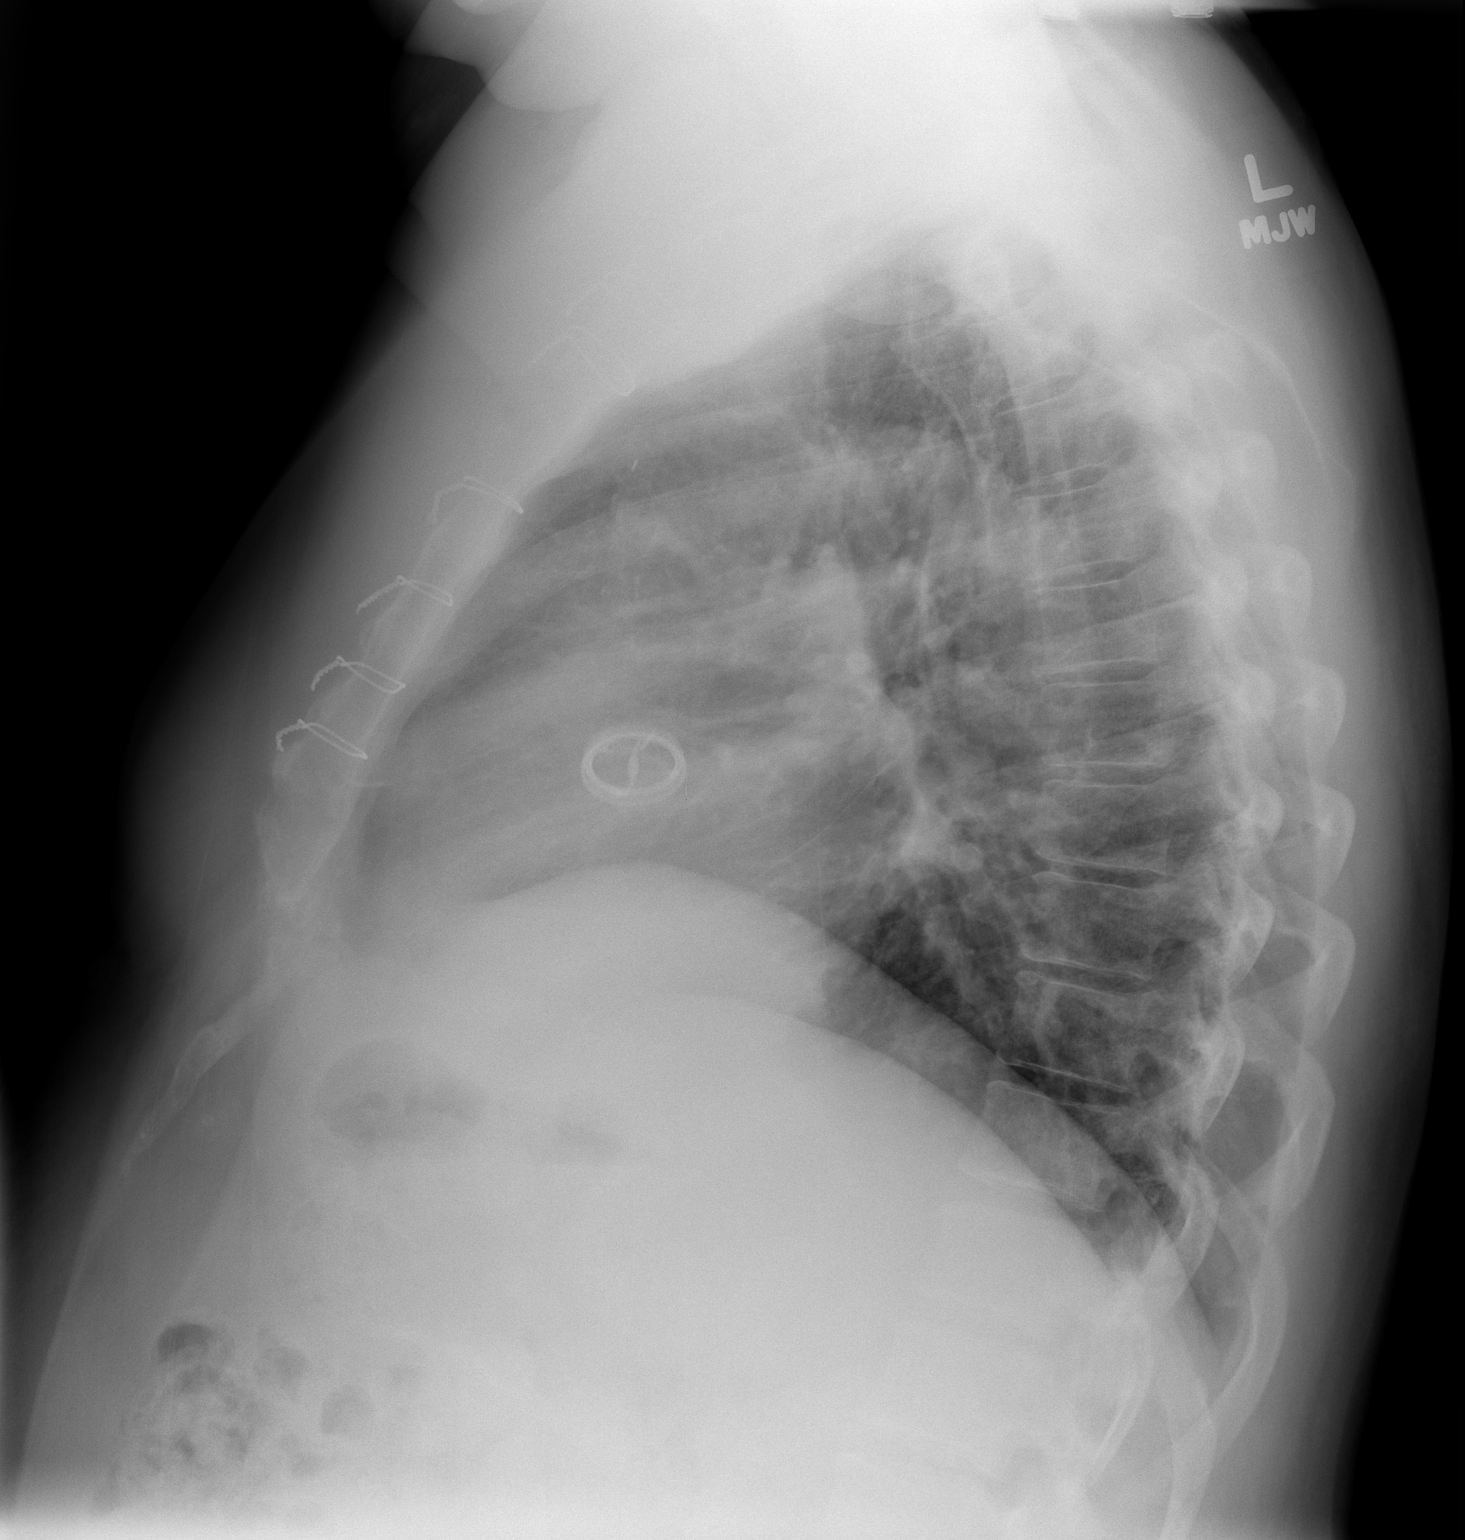

[2 of 2 positions shown; findings below may reference images not displayed]

FINDINGS: Lungs are clear. Heart is mildly enlarged with normal pulmonary
vascularity. Patient is status post aortic valve replacement. No
adenopathy. No pneumothorax. No bone lesions.
IMPRESSION: Mild cardiac enlargement with prosthetic aortic valve present. No
edema or consolidation.

## 2013-06-25 NOTE — ED Notes (Signed)
Pt walked from front & placed into room 33 prior to EKG

## 2013-06-25 NOTE — ED Notes (Signed)
EKG completed, given to Dr. Wilson Singer

## 2013-06-25 NOTE — ED Notes (Signed)
C/o intermittent right side non radiating CP, onset with mvmt or turning over in bed. Area tender to palpation. States pain lasts approx 2-3 min. Also has noticed SOB on exertion since yesterday. Denies fever, chills, cold, cough

## 2013-06-25 NOTE — Discharge Instructions (Signed)
Chest Pain (Nonspecific) °It is often hard to give a specific diagnosis for the cause of chest pain. There is always a chance that your pain could be related to something serious, such as a heart attack or a blood clot in the lungs. You need to follow up with your caregiver for further evaluation. °CAUSES  °· Heartburn. °· Pneumonia or bronchitis. °· Anxiety or stress. °· Inflammation around your heart (pericarditis) or lung (pleuritis or pleurisy). °· A blood clot in the lung. °· A collapsed lung (pneumothorax). It can develop suddenly on its own (spontaneous pneumothorax) or from injury (trauma) to the chest. °· Shingles infection (herpes zoster virus). °The chest wall is composed of bones, muscles, and cartilage. Any of these can be the source of the pain. °· The bones can be bruised by injury. °· The muscles or cartilage can be strained by coughing or overwork. °· The cartilage can be affected by inflammation and become sore (costochondritis). °DIAGNOSIS  °Lab tests or other studies, such as X-rays, electrocardiography, stress testing, or cardiac imaging, may be needed to find the cause of your pain.  °TREATMENT  °· Treatment depends on what may be causing your chest pain. Treatment may include: °· Acid blockers for heartburn. °· Anti-inflammatory medicine. °· Pain medicine for inflammatory conditions. °· Antibiotics if an infection is present. °· You may be advised to change lifestyle habits. This includes stopping smoking and avoiding alcohol, caffeine, and chocolate. °· You may be advised to keep your head raised (elevated) when sleeping. This reduces the chance of acid going backward from your stomach into your esophagus. °· Most of the time, nonspecific chest pain will improve within 2 to 3 days with rest and mild pain medicine. °HOME CARE INSTRUCTIONS  °· If antibiotics were prescribed, take your antibiotics as directed. Finish them even if you start to feel better. °· For the next few days, avoid physical  activities that bring on chest pain. Continue physical activities as directed. °· Do not smoke. °· Avoid drinking alcohol. °· Only take over-the-counter or prescription medicine for pain, discomfort, or fever as directed by your caregiver. °· Follow your caregiver's suggestions for further testing if your chest pain does not go away. °· Keep any follow-up appointments you made. If you do not go to an appointment, you could develop lasting (chronic) problems with pain. If there is any problem keeping an appointment, you must call to reschedule. °SEEK MEDICAL CARE IF:  °· You think you are having problems from the medicine you are taking. Read your medicine instructions carefully. °· Your chest pain does not go away, even after treatment. °· You develop a rash with blisters on your chest. °SEEK IMMEDIATE MEDICAL CARE IF:  °· You have increased chest pain or pain that spreads to your arm, neck, jaw, back, or abdomen. °· You develop shortness of breath, an increasing cough, or you are coughing up blood. °· You have severe back or abdominal pain, feel nauseous, or vomit. °· You develop severe weakness, fainting, or chills. °· You have a fever. °THIS IS AN EMERGENCY. Do not wait to see if the pain will go away. Get medical help at once. Call your local emergency services (911 in [place] Do not drive yourself to the hospital. °MAKE SURE YOU:  °· Understand these instructions. °· Will watch your condition. °· Will get help right away if you are not doing well or get worse. °Document Released: 02/16/2005 Document Revised: 08/01/2011 Document Reviewed: 12/13/2007 °[employer]® Patient Information ©2014 [employer]   [employer]. ° °

## 2013-06-25 NOTE — ED Provider Notes (Signed)
CSN: [id number]     Arrival date & time 06/25/13  0801 History   First MD Initiated Contact with Patient 06/25/13 [phone]     Chief Complaint  Patient presents with  . Chest Pain   (Consider location/radiation/quality/duration/timing/severity/associated sxs/prior Treatment) HPI  Male with chest pain. Intermittent. Right sided. Onset about a week ago. Worse with movement, particularly notices when turning over in bed at night. Pain will last 1-2 minutes and gradually subsided. Not associated with any other symptoms. No fevers or chills. No cough. No shortness of breath. No nausea. No diaphoresis. No unusual leg pain or swelling. Patient does have a known history of coronary artery disease.  Outpatient echo on March 12, 2013. Ejection fraction was 55-60%. The prosthetic aortic valve with transvalvular velocity was within the normal range. Mild regurgitation.    Past Medical History  Diagnosis Date  . Diabetes mellitus   . Hypertension   . Unspecified vitamin D deficiency 08/07/2008  . HYPERLIPIDEMIA 08/20/2007  . CAD 05/21/2007  . Aortic valve disorders 09/30/2009  . Gross hematuria 08/07/2008  . TRANSIENT ISCHEMIC ATTACK, HX OF 12/26/2007  . NEPHROLITHIASIS, HX OF 05/21/2007  . Atrial fibrillation 07/27/2010   Past Surgical History  Procedure Laterality Date  . Appendectomy  1998  . Coronary artery bypass graft  2000   Family History  Problem Relation Age of Onset  . Coronary artery disease Mother   . Hypertension Mother   . Diabetes Brother   . Coronary artery disease Other 28    male 1st degree relative, CABG in his 76's (father)   History  Substance Use Topics  . Smoking status: Never Smoker   . Smokeless tobacco: Not on file  . Alcohol Use: No    Review of Systems  All systems reviewed and negative, other than as noted in HPI.   Allergies  Insulin glargine  Home Medications   Current Outpatient Rx  Name  Route  Sig  Dispense  Refill  . amLODipine (NORVASC) 10 MG  tablet      take 1 tablet by mouth once daily for high blood pressure   30 tablet   5   . aspirin 81 MG EC tablet   Oral   Take 81 mg by mouth daily.           . Cholecalciferol (VITAMIN D3) 1000 UNITS CAPS   Oral   Take 1,000 Units by mouth daily.          . cloNIDine (CATAPRES) 0.1 MG tablet      take 1 tablet by mouth twice a day   60 tablet   0     NO FURTHER REFILLS WILL BE GRANTED UNTIL PATIENT C ...   . furosemide (LASIX) 40 MG tablet      take 1 tablet by mouth once daily   30 tablet   5   . glimepiride (AMARYL) 1 MG tablet      take 1 tablet by mouth once daily before BREAKFAST   90 tablet   1   . losartan (COZAAR) 100 MG tablet      take 1 tablet by mouth once daily   30 tablet   6   . metFORMIN (GLUCOPHAGE) 1000 MG tablet      take 1 tablet by mouth twice a day with food   60 tablet   11   . metoprolol (TOPROL-XL) 200 MG 24 hr tablet   Oral   Take 200 mg by mouth daily.           [name]  potassium chloride SA (K-DUR,KLOR-CON) 20 MEQ tablet   Oral   Take 20 mEq by mouth 2 (two) times daily.         . pravastatin (PRAVACHOL) 40 MG tablet   Oral   Take 40 mg by mouth daily.         [name] warfarin (COUMADIN) 5 MG tablet      Take as directed by coumadin clinic   35 tablet   3    BP 156/101  Temp(Src) 98.4 F (36.9 C) (Oral)  Resp 18  Ht 6\' 2"  (1.88 m)  Wt 298 lb (135.172 kg)  BMI 38.24 kg/m2  SpO2 99% Physical Exam  Nursing note and vitals reviewed. Constitutional: He appears well-developed and well-nourished. No distress.  HENT:  Head: Normocephalic and atraumatic.  Eyes: Conjunctivae are normal. Right eye exhibits no discharge. Left eye exhibits no discharge.  Neck: Neck supple.  Cardiovascular: Normal rate, regular rhythm and normal heart sounds.  Exam reveals no gallop and no friction rub.   No murmur heard. Pulmonary/Chest: Effort normal and breath sounds normal. No respiratory distress. He exhibits tenderness.  Abdominal:  Soft. He exhibits no distension. There is no tenderness.  Musculoskeletal: He exhibits no edema and no tenderness.  Lower extremities symmetric as compared to each other. No calf tenderness. Negative Homan's. No palpable cords.   Neurological: He is alert.  Skin: Skin is warm and dry.  Psychiatric: He has a normal mood and affect. His behavior is normal. Thought content normal.    ED Course  Procedures (including critical care time) Labs Review Labs Reviewed - No data to display Imaging Review No results found.  EKG Interpretation    Date/Time:  Tuesday June 25 2013 08:20:37 EST Ventricular Rate:  72 PR Interval:  134 QRS Duration: 98 QT Interval:  422 QTC Calculation: 462 R Axis:   -5 Text Interpretation:  Normal sinus rhythm voltage criteria for LVH in aVL Nonspecific T wave abnormality No significant change since last tracing Confirmed by Lou Loewe  MD, Louise Victory ([id number]) on 06/25/2013 10:12:01 AM            MDM   1. Chest pain    48 year old male with chest pain. Atypical for ACS. Consistent with a musculoskeletal etiology. Very low suspicion for other potential emergent etiology such as infectious, pulmonary embolism or dissection. Plan symptomatic treatment. Return precautions were discussed. Outpatient followup otherwise.     Virgel Manifold, MD 06/30/13 2250

## 2013-06-25 NOTE — ED Notes (Signed)
C/o intermittent right side CP x 1 week, worse with mvmt.

## 2013-06-25 NOTE — ED Notes (Signed)
Patient transported to X-ray 

## 2013-06-26 ENCOUNTER — Telehealth: Payer: Self-pay | Admitting: Cardiology

## 2013-06-26 NOTE — Telephone Encounter (Signed)
New message  Patient's wife would like to speak with you, she wouldn't give me any information. Other than she wanted to speak with you. Please call.

## 2013-06-26 NOTE — Telephone Encounter (Signed)
Follow up     C/O chest pain. Went to hospital on yesterday they said he had a pull muscle.  No nitro was taken . Only take 81 mg of asa.  Patient stated he doesn't know how he gotten a pull muscle.

## 2013-06-26 NOTE — Telephone Encounter (Signed)
Spoke with pt, he has been having a pulling, tight pain is his chest for about 6 days now. The discomfort occurs when he lies down. He will have to pyut his arm above his head or sit up to get relief. He c/o SOB with exertion, none at rest. He denies edema. He went to the ER yesterday and all his labs were normal. Reassurance given to pt, do not feel this is heart related his tropin was normal. Advised pt to discuss with his primary care doctor. Pt agreed with this plan.

## 2013-07-19 ENCOUNTER — Encounter (HOSPITAL_COMMUNITY): Payer: Self-pay | Admitting: Emergency Medicine

## 2013-07-19 ENCOUNTER — Inpatient Hospital Stay (HOSPITAL_COMMUNITY)
Admission: EM | Admit: 2013-07-19 | Discharge: 2013-07-25 | DRG: 248 | Disposition: A | Discharge status: Home / Self Care | Payer: Medicare Other | Attending: Cardiology | Admitting: Cardiology

## 2013-07-19 ENCOUNTER — Emergency Department (HOSPITAL_COMMUNITY): Payer: Medicare Other

## 2013-07-19 ENCOUNTER — Encounter (HOSPITAL_COMMUNITY)
Admission: EM | Disposition: A | Discharge status: Home / Self Care | Payer: Medicare Other | Source: Home / Self Care | Attending: Cardiology

## 2013-07-19 DIAGNOSIS — I350 Nonrheumatic aortic (valve) stenosis: Secondary | ICD-10-CM

## 2013-07-19 DIAGNOSIS — I5031 Acute diastolic (congestive) heart failure: Secondary | ICD-10-CM

## 2013-07-19 DIAGNOSIS — IMO0002 Reserved for concepts with insufficient information to code with codable children: Secondary | ICD-10-CM | POA: Diagnosis not present

## 2013-07-19 DIAGNOSIS — E669 Obesity, unspecified: Secondary | ICD-10-CM | POA: Diagnosis present

## 2013-07-19 DIAGNOSIS — R0609 Other forms of dyspnea: Secondary | ICD-10-CM | POA: Diagnosis not present

## 2013-07-19 DIAGNOSIS — I2510 Atherosclerotic heart disease of native coronary artery without angina pectoris: Secondary | ICD-10-CM | POA: Diagnosis present

## 2013-07-19 DIAGNOSIS — I509 Heart failure, unspecified: Secondary | ICD-10-CM | POA: Diagnosis present

## 2013-07-19 DIAGNOSIS — I25810 Atherosclerosis of coronary artery bypass graft(s) without angina pectoris: Principal | ICD-10-CM | POA: Diagnosis present

## 2013-07-19 DIAGNOSIS — R079 Chest pain, unspecified: Secondary | ICD-10-CM | POA: Diagnosis not present

## 2013-07-19 DIAGNOSIS — Z79899 Other long term (current) drug therapy: Secondary | ICD-10-CM | POA: Diagnosis not present

## 2013-07-19 DIAGNOSIS — I10 Essential (primary) hypertension: Secondary | ICD-10-CM | POA: Diagnosis not present

## 2013-07-19 DIAGNOSIS — I7101 Dissection of ascending aorta: Secondary | ICD-10-CM | POA: Diagnosis present

## 2013-07-19 DIAGNOSIS — I5033 Acute on chronic diastolic (congestive) heart failure: Secondary | ICD-10-CM | POA: Diagnosis not present

## 2013-07-19 DIAGNOSIS — Z9689 Presence of other specified functional implants: Secondary | ICD-10-CM | POA: Diagnosis present

## 2013-07-19 DIAGNOSIS — E119 Type 2 diabetes mellitus without complications: Secondary | ICD-10-CM | POA: Diagnosis present

## 2013-07-19 DIAGNOSIS — I359 Nonrheumatic aortic valve disorder, unspecified: Secondary | ICD-10-CM | POA: Diagnosis not present

## 2013-07-19 DIAGNOSIS — I200 Unstable angina: Secondary | ICD-10-CM | POA: Diagnosis present

## 2013-07-19 DIAGNOSIS — Z7901 Long term (current) use of anticoagulants: Secondary | ICD-10-CM | POA: Diagnosis not present

## 2013-07-19 DIAGNOSIS — R0989 Other specified symptoms and signs involving the circulatory and respiratory systems: Secondary | ICD-10-CM | POA: Diagnosis not present

## 2013-07-19 DIAGNOSIS — E785 Hyperlipidemia, unspecified: Secondary | ICD-10-CM | POA: Diagnosis present

## 2013-07-19 DIAGNOSIS — Z8249 Family history of ischemic heart disease and other diseases of the circulatory system: Secondary | ICD-10-CM

## 2013-07-19 DIAGNOSIS — Z7982 Long term (current) use of aspirin: Secondary | ICD-10-CM

## 2013-07-19 DIAGNOSIS — Y9210 Unspecified residential institution as the place of occurrence of the external cause: Secondary | ICD-10-CM | POA: Diagnosis not present

## 2013-07-19 DIAGNOSIS — R791 Abnormal coagulation profile: Secondary | ICD-10-CM | POA: Diagnosis present

## 2013-07-19 DIAGNOSIS — R58 Hemorrhage, not elsewhere classified: Secondary | ICD-10-CM | POA: Diagnosis not present

## 2013-07-19 DIAGNOSIS — E559 Vitamin D deficiency, unspecified: Secondary | ICD-10-CM | POA: Diagnosis present

## 2013-07-19 DIAGNOSIS — Y849 Medical procedure, unspecified as the cause of abnormal reaction of the patient, or of later complication, without mention of misadventure at the time of the procedure: Secondary | ICD-10-CM | POA: Diagnosis not present

## 2013-07-19 DIAGNOSIS — R070 Pain in throat: Secondary | ICD-10-CM | POA: Diagnosis not present

## 2013-07-19 DIAGNOSIS — Y831 Surgical operation with implant of artificial internal device as the cause of abnormal reaction of the patient, or of later complication, without mention of misadventure at the time of the procedure: Secondary | ICD-10-CM | POA: Diagnosis present

## 2013-07-19 DIAGNOSIS — T8209XA Other mechanical complication of heart valve prosthesis, initial encounter: Secondary | ICD-10-CM | POA: Diagnosis present

## 2013-07-19 DIAGNOSIS — I71019 Dissection of thoracic aorta, unspecified: Secondary | ICD-10-CM | POA: Diagnosis not present

## 2013-07-19 DIAGNOSIS — I252 Old myocardial infarction: Secondary | ICD-10-CM | POA: Diagnosis not present

## 2013-07-19 DIAGNOSIS — I4891 Unspecified atrial fibrillation: Secondary | ICD-10-CM | POA: Diagnosis present

## 2013-07-19 DIAGNOSIS — Z952 Presence of prosthetic heart valve: Secondary | ICD-10-CM

## 2013-07-19 DIAGNOSIS — Z954 Presence of other heart-valve replacement: Secondary | ICD-10-CM | POA: Diagnosis not present

## 2013-07-19 DIAGNOSIS — S301XXA Contusion of abdominal wall, initial encounter: Secondary | ICD-10-CM

## 2013-07-19 DIAGNOSIS — I249 Acute ischemic heart disease, unspecified: Secondary | ICD-10-CM

## 2013-07-19 DIAGNOSIS — K8020 Calculus of gallbladder without cholecystitis without obstruction: Secondary | ICD-10-CM | POA: Diagnosis not present

## 2013-07-19 DIAGNOSIS — Z8673 Personal history of transient ischemic attack (TIA), and cerebral infarction without residual deficits: Secondary | ICD-10-CM

## 2013-07-19 DIAGNOSIS — I480 Paroxysmal atrial fibrillation: Secondary | ICD-10-CM | POA: Diagnosis present

## 2013-07-19 HISTORY — DX: Obesity, unspecified: E66.9

## 2013-07-19 HISTORY — DX: Presence of prosthetic heart valve: Z95.2

## 2013-07-19 HISTORY — DX: Dissection of ascending aorta: I71.010

## 2013-07-19 HISTORY — DX: Paroxysmal atrial fibrillation: I48.0

## 2013-07-19 HISTORY — PX: LEFT AND RIGHT HEART CATHETERIZATION WITH CORONARY ANGIOGRAM: SHX5449

## 2013-07-19 HISTORY — DX: Hyperlipidemia, unspecified: E78.5

## 2013-07-19 HISTORY — DX: Dissection of thoracic aorta: I71.01

## 2013-07-19 LAB — HEPATIC FUNCTION PANEL
ALT: 20 U/L (ref 0–53)
AST: 26 U/L (ref 0–37)
Albumin: 3.7 g/dL (ref 3.5–5.2)
Alkaline Phosphatase: 60 U/L (ref 39–117)
BILIRUBIN DIRECT: 0.3 mg/dL (ref 0.0–0.3)
BILIRUBIN TOTAL: 1.5 mg/dL — AB (ref 0.3–1.2)
Indirect Bilirubin: 1.2 mg/dL — ABNORMAL HIGH (ref 0.3–0.9)
Total Protein: 8.2 g/dL (ref 6.0–8.3)

## 2013-07-19 LAB — COMPREHENSIVE METABOLIC PANEL
ALK PHOS: 53 U/L (ref 39–117)
ALT: 18 U/L (ref 0–53)
AST: 25 U/L (ref 0–37)
Albumin: 3.5 g/dL (ref 3.5–5.2)
BUN: 13 mg/dL (ref 6–23)
CHLORIDE: 105 meq/L (ref 96–112)
CO2: 26 mEq/L (ref 19–32)
Calcium: 9 mg/dL (ref 8.4–10.5)
Creatinine, Ser: 0.97 mg/dL (ref 0.50–1.35)
GLUCOSE: 123 mg/dL — AB (ref 70–99)
POTASSIUM: 4 meq/L (ref 3.7–5.3)
SODIUM: 141 meq/L (ref 137–147)
Total Bilirubin: 1 mg/dL (ref 0.3–1.2)
Total Protein: 7.7 g/dL (ref 6.0–8.3)

## 2013-07-19 LAB — POCT I-STAT 3, ART BLOOD GAS (G3+)
BICARBONATE: 25.9 meq/L — AB (ref 20.0–24.0)
O2 SAT: 86 %
PO2 ART: 53 mmHg — AB (ref 80.0–100.0)
TCO2: 27 mmol/L (ref 0–100)
pCO2 arterial: 45.2 mmHg — ABNORMAL HIGH (ref 35.0–45.0)
pH, Arterial: 7.366 (ref 7.350–7.450)

## 2013-07-19 LAB — POCT ACTIVATED CLOTTING TIME: ACTIVATED CLOTTING TIME: 431 s

## 2013-07-19 LAB — CBC WITH DIFFERENTIAL/PLATELET
BASOS ABS: 0 10*3/uL (ref 0.0–0.1)
Basophils Relative: 0 % (ref 0–1)
Eosinophils Absolute: 0.2 10*3/uL (ref 0.0–0.7)
Eosinophils Relative: 4 % (ref 0–5)
HCT: 35.6 % — ABNORMAL LOW (ref 39.0–52.0)
Hemoglobin: 12.7 g/dL — ABNORMAL LOW (ref 13.0–17.0)
Lymphocytes Relative: 25 % (ref 12–46)
Lymphs Abs: 1.5 10*3/uL (ref 0.7–4.0)
MCH: 28.6 pg (ref 26.0–34.0)
MCHC: 35.7 g/dL (ref 30.0–36.0)
MCV: 80.2 fL (ref 78.0–100.0)
MONO ABS: 0.5 10*3/uL (ref 0.1–1.0)
MONOS PCT: 8 % (ref 3–12)
NEUTROS ABS: 3.7 10*3/uL (ref 1.7–7.7)
NEUTROS PCT: 63 % (ref 43–77)
Platelets: 157 10*3/uL (ref 150–400)
RBC: 4.44 MIL/uL (ref 4.22–5.81)
RDW: 15.6 % — AB (ref 11.5–15.5)
WBC: 5.9 10*3/uL (ref 4.0–10.5)

## 2013-07-19 LAB — TROPONIN I
Troponin I: <0.3 ng/mL (ref <0.30)
Troponin I: <0.3 ng/mL (ref <0.30)

## 2013-07-19 LAB — GLUCOSE, CAPILLARY: Glucose-Capillary: 179 mg/dL — ABNORMAL HIGH (ref 70–99)

## 2013-07-19 LAB — PROTIME-INR
INR: 1.28 (ref 0.00–1.49)
PROTHROMBIN TIME: 15.7 s — AB (ref 11.6–15.2)

## 2013-07-19 LAB — PRO B NATRIURETIC PEPTIDE: PRO B NATRI PEPTIDE: 912.2 pg/mL — AB (ref 0–125)

## 2013-07-19 IMAGING — DX DG CHEST 1V PORT
1 series · 1 of 1 positions shown · non-contrast
Comparison: 06/25/2013 and earlier.

CLINICAL DATA: 47-year-old male shortness of breath and chest pain
and tightness. Initial encounter.

EXAM:
PORTABLE CHEST - 1 VIEW

[portable]
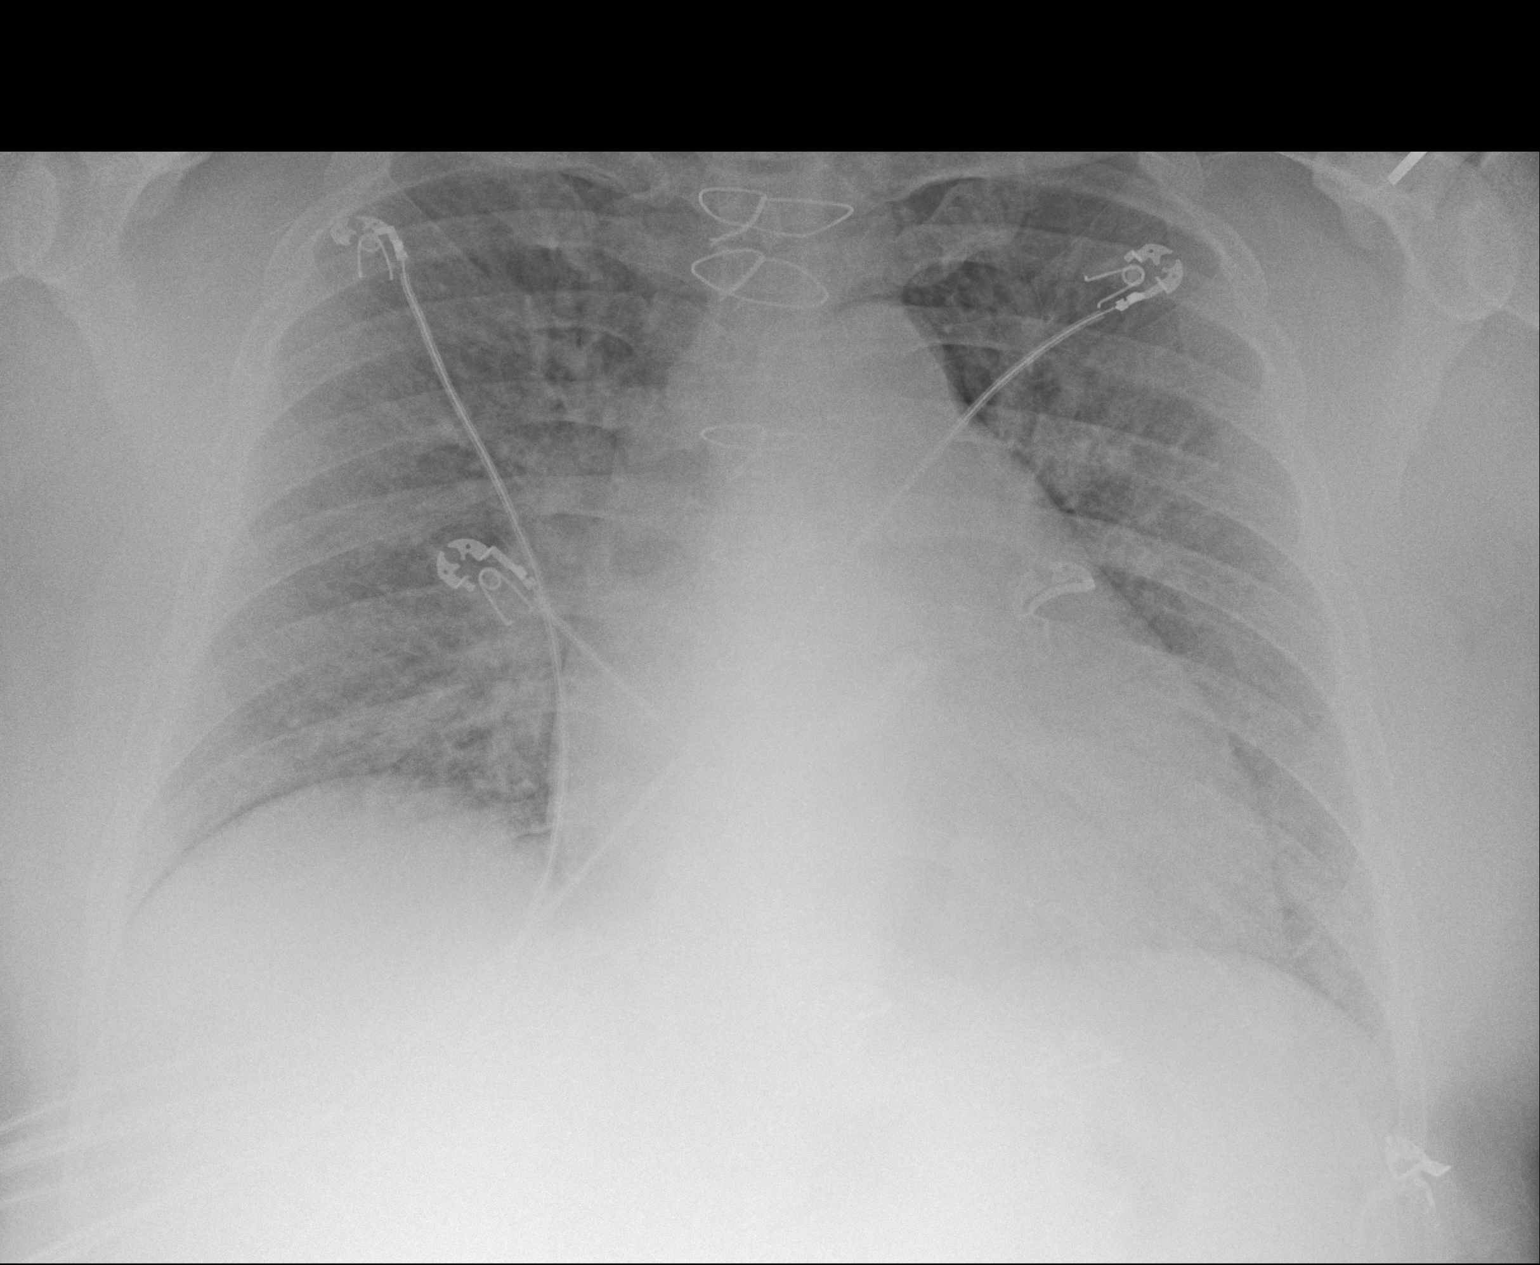

[1 of 1 positions shown; findings below may reference images not displayed]

FINDINGS: Portable AP semi upright view at 2336 hrs. Stable cardiomegaly and
mediastinal contours. Sequelae of cardiac valve replacement. No
pneumothorax or pleural effusion. Interval increased interstitial
opacity. No consolidation.
IMPRESSION: Stable cardiomegaly. Interval increased interstitial opacity favored
to reflect vascular congestion/ mild interstitial edema. Viral/
atypical respiratory infection less likely.

## 2013-07-19 SURGERY — LEFT AND RIGHT HEART CATHETERIZATION WITH CORONARY ANGIOGRAM
Anesthesia: LOCAL

## 2013-07-19 MED ORDER — METOPROLOL SUCCINATE ER 100 MG PO TB24
200.0000 mg | ORAL_TABLET | Freq: Every day | ORAL | Status: DC
  Administered 2013-07-20 – 2013-07-25 (×6): 200 mg via ORAL
  Filled 2013-07-19 (×6): qty 2

## 2013-07-19 MED ORDER — CLONIDINE HCL 0.1 MG PO TABS
0.1000 mg | ORAL_TABLET | Freq: Two times a day (BID) | ORAL | Status: DC
  Administered 2013-07-19 – 2013-07-25 (×11): 0.1 mg via ORAL
  Filled 2013-07-19 (×14): qty 1

## 2013-07-19 MED ORDER — LIDOCAINE HCL (PF) 1 % IJ SOLN
INTRAMUSCULAR | Status: AC
  Filled 2013-07-19: qty 30

## 2013-07-19 MED ORDER — TICAGRELOR 90 MG PO TABS
ORAL_TABLET | ORAL | Status: AC
  Filled 2013-07-19: qty 2

## 2013-07-19 MED ORDER — SODIUM CHLORIDE 0.9 % IJ SOLN: 3.0000 mL | INTRAMUSCULAR | Status: DC | PRN

## 2013-07-19 MED ORDER — NITROGLYCERIN 0.4 MG SL SUBL
0.4000 mg | SUBLINGUAL_TABLET | SUBLINGUAL | Status: DC | PRN
  Administered 2013-07-19: 0.4 mg via SUBLINGUAL
  Filled 2013-07-19 (×2): qty 25

## 2013-07-19 MED ORDER — MIDAZOLAM HCL 2 MG/2ML IJ SOLN
INTRAMUSCULAR | Status: AC
  Filled 2013-07-19: qty 2

## 2013-07-19 MED ORDER — VITAMIN D 1000 UNITS PO TABS
1000.0000 [IU] | ORAL_TABLET | Freq: Every day | ORAL | Status: DC
  Administered 2013-07-19 – 2013-07-25 (×7): 1000 [IU] via ORAL
  Filled 2013-07-19 (×7): qty 1

## 2013-07-19 MED ORDER — SIMVASTATIN 20 MG PO TABS
20.0000 mg | ORAL_TABLET | Freq: Every day | ORAL | Status: DC
  Administered 2013-07-19: 20 mg via ORAL
  Filled 2013-07-19 (×2): qty 1

## 2013-07-19 MED ORDER — NITROGLYCERIN 0.2 MG/ML ON CALL CATH LAB
INTRAVENOUS | Status: AC
  Filled 2013-07-19: qty 1

## 2013-07-19 MED ORDER — BIVALIRUDIN 250 MG IV SOLR
INTRAVENOUS | Status: AC
  Filled 2013-07-19: qty 250

## 2013-07-19 MED ORDER — FUROSEMIDE 40 MG PO TABS
40.0000 mg | ORAL_TABLET | Freq: Every day | ORAL | Status: DC
  Administered 2013-07-20: 40 mg via ORAL
  Filled 2013-07-19 (×2): qty 1

## 2013-07-19 MED ORDER — ASPIRIN 81 MG PO CHEW
81.0000 mg | CHEWABLE_TABLET | Freq: Every day | ORAL | Status: DC
  Administered 2013-07-20: 81 mg via ORAL
  Filled 2013-07-19: qty 1

## 2013-07-19 MED ORDER — GLIMEPIRIDE 1 MG PO TABS
1.0000 mg | ORAL_TABLET | Freq: Every day | ORAL | Status: DC
  Administered 2013-07-20 – 2013-07-21 (×2): 1 mg via ORAL
  Filled 2013-07-19 (×3): qty 1

## 2013-07-19 MED ORDER — SODIUM CHLORIDE 0.9 % IV BOLUS (SEPSIS)
250.0000 mL | Freq: Once | INTRAVENOUS | Status: AC
  Administered 2013-07-19: 250 mL via INTRAVENOUS

## 2013-07-19 MED ORDER — SODIUM CHLORIDE 0.9 % IV SOLN
INTRAVENOUS | Status: DC
  Administered 2013-07-19: 10 mL/h via INTRAVENOUS
  Administered 2013-07-23: via INTRAVENOUS

## 2013-07-19 MED ORDER — SODIUM CHLORIDE 0.9 % IV SOLN: 1.0000 mL/kg/h | INTRAVENOUS | Status: DC

## 2013-07-19 MED ORDER — PRASUGREL HCL 10 MG PO TABS
ORAL_TABLET | ORAL | Status: AC
  Filled 2013-07-19: qty 6

## 2013-07-19 MED ORDER — SODIUM CHLORIDE 0.9 % IV SOLN: 250.0000 mL | INTRAVENOUS | Status: DC | PRN

## 2013-07-19 MED ORDER — INSULIN ASPART 100 UNIT/ML ~~LOC~~ SOLN
0.0000 [IU] | Freq: Three times a day (TID) | SUBCUTANEOUS | Status: DC
  Administered 2013-07-20: 2 [IU] via SUBCUTANEOUS
  Administered 2013-07-20: 1 [IU] via SUBCUTANEOUS
  Administered 2013-07-20: 2 [IU] via SUBCUTANEOUS
  Administered 2013-07-21 (×3): 1 [IU] via SUBCUTANEOUS
  Administered 2013-07-22: 2 [IU] via SUBCUTANEOUS
  Administered 2013-07-22 – 2013-07-25 (×8): 1 [IU] via SUBCUTANEOUS

## 2013-07-19 MED ORDER — HEPARIN (PORCINE) IN NACL 2-0.9 UNIT/ML-% IJ SOLN
INTRAMUSCULAR | Status: AC
  Filled 2013-07-19: qty 1500

## 2013-07-19 MED ORDER — MORPHINE SULFATE 2 MG/ML IJ SOLN
2.0000 mg | Freq: Once | INTRAMUSCULAR | Status: AC
  Administered 2013-07-19: 2 mg via INTRAVENOUS
  Filled 2013-07-19: qty 1

## 2013-07-19 MED ORDER — FUROSEMIDE 10 MG/ML IJ SOLN
40.0000 mg | Freq: Once | INTRAMUSCULAR | Status: DC
  Filled 2013-07-19 (×2): qty 4

## 2013-07-19 MED ORDER — HEPARIN BOLUS VIA INFUSION
4000.0000 [IU] | Freq: Once | INTRAVENOUS | Status: DC
  Filled 2013-07-19: qty 4000

## 2013-07-19 MED ORDER — SODIUM CHLORIDE 0.9 % IJ SOLN
3.0000 mL | Freq: Two times a day (BID) | INTRAMUSCULAR | Status: DC
  Administered 2013-07-19 – 2013-07-25 (×9): 3 mL via INTRAVENOUS

## 2013-07-19 MED ORDER — SODIUM CHLORIDE 0.9 % IV SOLN
INTRAVENOUS | Status: DC
  Administered 2013-07-19: 13:00:00 via INTRAVENOUS

## 2013-07-19 MED ORDER — HYDRALAZINE HCL 20 MG/ML IJ SOLN: 20.0000 mg | INTRAMUSCULAR | Status: DC | PRN

## 2013-07-19 MED ORDER — ASPIRIN 81 MG PO CHEW: 81.0000 mg | CHEWABLE_TABLET | Freq: Every day | ORAL | Status: DC

## 2013-07-19 MED ORDER — HEPARIN (PORCINE) IN NACL 100-0.45 UNIT/ML-% IJ SOLN
2050.0000 [IU]/h | INTRAMUSCULAR | Status: DC
  Administered 2013-07-20: 2050 [IU]/h via INTRAVENOUS
  Administered 2013-07-20: 1750 [IU]/h via INTRAVENOUS
  Administered 2013-07-21: 2050 [IU]/h via INTRAVENOUS
  Filled 2013-07-19 (×6): qty 250

## 2013-07-19 MED ORDER — HEPARIN (PORCINE) IN NACL 100-0.45 UNIT/ML-% IJ SOLN
1750.0000 [IU]/h | INTRAMUSCULAR | Status: DC
  Filled 2013-07-19 (×2): qty 250

## 2013-07-19 MED ORDER — HYDRALAZINE HCL 20 MG/ML IJ SOLN
INTRAMUSCULAR | Status: AC
  Filled 2013-07-19: qty 1

## 2013-07-19 MED ORDER — WARFARIN SODIUM 10 MG PO TABS
10.0000 mg | ORAL_TABLET | Freq: Once | ORAL | Status: AC
  Administered 2013-07-19: 10 mg via ORAL
  Filled 2013-07-19: qty 1

## 2013-07-19 MED ORDER — SODIUM CHLORIDE 0.9 % IV SOLN
1.7500 mg/kg/h | INTRAVENOUS | Status: DC
  Filled 2013-07-19: qty 250

## 2013-07-19 MED ORDER — WARFARIN - PHARMACIST DOSING INPATIENT
Freq: Every day | Status: DC
  Administered 2013-07-20: 17:00:00

## 2013-07-19 MED ORDER — ONDANSETRON HCL 4 MG/2ML IJ SOLN
4.0000 mg | Freq: Once | INTRAMUSCULAR | Status: AC
  Administered 2013-07-19: 4 mg via INTRAVENOUS
  Filled 2013-07-19: qty 2

## 2013-07-19 MED ORDER — FENTANYL CITRATE 0.05 MG/ML IJ SOLN
INTRAMUSCULAR | Status: AC
  Filled 2013-07-19: qty 2

## 2013-07-19 MED ORDER — ASPIRIN 81 MG PO CHEW
324.0000 mg | CHEWABLE_TABLET | Freq: Once | ORAL | Status: AC
  Administered 2013-07-19: 324 mg via ORAL
  Filled 2013-07-19: qty 4

## 2013-07-19 MED ORDER — FUROSEMIDE 10 MG/ML IJ SOLN
40.0000 mg | Freq: Once | INTRAMUSCULAR | Status: AC
  Administered 2013-07-19: 40 mg via INTRAVENOUS

## 2013-07-19 MED ORDER — CLOPIDOGREL BISULFATE 75 MG PO TABS
75.0000 mg | ORAL_TABLET | Freq: Every day | ORAL | Status: DC
  Administered 2013-07-20 – 2013-07-25 (×6): 75 mg via ORAL
  Filled 2013-07-19 (×8): qty 1

## 2013-07-19 MED ORDER — POTASSIUM CHLORIDE CRYS ER 20 MEQ PO TBCR
20.0000 meq | EXTENDED_RELEASE_TABLET | Freq: Two times a day (BID) | ORAL | Status: DC
  Administered 2013-07-19 – 2013-07-25 (×12): 20 meq via ORAL
  Filled 2013-07-19 (×14): qty 1

## 2013-07-19 MED ORDER — LOSARTAN POTASSIUM 50 MG PO TABS
100.0000 mg | ORAL_TABLET | Freq: Every day | ORAL | Status: DC
  Administered 2013-07-20 – 2013-07-25 (×6): 100 mg via ORAL
  Filled 2013-07-19 (×6): qty 2

## 2013-07-19 MED ORDER — SODIUM CHLORIDE 0.9 % IV SOLN
1.7500 mg/kg/h | INTRAVENOUS | Status: AC
  Administered 2013-07-19 (×2): 1.75 mg/kg/h via INTRAVENOUS
  Filled 2013-07-19 (×3): qty 250

## 2013-07-19 NOTE — H&P (Signed)
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 He was last seen by cardiology in 03/2013 during admission for CP deemed musculoskeletal. CTA was negative for PE and showed "postsurgical changes of aortic valve replacement and what is likely prior repair of Type A aortic dissection, though an intimal flap with fenestration is again seen at the mildly dilated proximal aortic arch, grossly unchanged since 08/11/211." 2D echo in 02/2013 showed mod LVH, EF 55-50%, prosthetic aortic valve with normal mean gradient of 21 mmHg and mild AI. He is on chronic Coumadin. Last occurrence of AF that I can see was during an ER visit 03/2012. He is on Coumadin but INR today is 1.28. He presents today with complaints of chest pain.  He works out 3-4x/week. He was seen in the ER 2/5 with an episode of CP again deemed musculoskeletal. He reports that 2 weeks ago while doing bicep curls he developed a sense of chest tightness. For 1 week after he had soreness across his whole chest and arms. Last workout was on Wednesday of last week when he did 1 hr 44min on bike, 1 hr on treadmill, step exercises then another 30 min on treadmill without any exertional symptoms. He has had DOE building over the last few days. Last night he ate [employer] and didn't feel quite right in the evening. He developed a funny taste in his mouth and had a sensation of feeling hot. He laid down but developed what  sounds like orthopnea which was new for him. He got up this morning and walked to the mailbox. When walking back up to his house on an incline he developed SOB on exertion and central chest pain radiating to the left chest. It felt like someone was pressing/pounding on his chest (no palpitations though). He went inside and rested and symptoms resolved. He drove his daughter to work and had a mild headache while doing so. While walking back to his truck at his daughter's work, he again had SOB. This prompted him to come to the ER. In the ED he had another episode of CP and felt nauseated with watery eyes. This episode is ongoing, rated 8/10 although he is comfortable appearing. 1 SL NTG did not help. 2mg  of morphine made him dizzy. BP slightly elevated 140s-160s. CXR with stable cardiomegaly, mild vascular congestion, less likely infection. Troponin neg x 1 and pBNP 912. Hgb 12.7. INR is markedly subtherapeutic at 1.28 but he reports compliance with his regimen, no new med changes, no dietary changes. This was 1.8 in the ER a few weeks ago. He appears to have been compliant with his outpatient Coumadin visits (last check 1/30 with plan to recheck 4 weeks).  No recent weight gain, travel, bedrest, surgery, syncope, or palpitations. He states he otherwise tries to eat healthy including tilapia and fruit - has lost 15-20lbs intentionally over the last few months through diet and exercise. Denies tobacco, EtOH, illicit drug use.  Past Medical History  Diagnosis Date  . Diabetes mellitus   .  Hypertension   . Unspecified vitamin D deficiency   . Hyperlipidemia   . H/O cardiovascular stress test     a. Myoview 2011: walked for 39min, images with EF 39%, prominent inf scar, mild reversibility. b. Cardiac CT 06/2010: no CAD, only mild plaque in prox RCA. c. EF normal by echo 2014.  [name] hematuria   . TRANSIENT ISCHEMIC ATTACK, HX OF     a. In 2004.  [name] NEPHROLITHIASIS, HX OF   . PAF (paroxysmal atrial  fibrillation)     a. H/o such, with recurrence of coarse afib/flutter during ER consult 03/2012.  . Ascending aortic dissection     a. 2000 - with aortic valve involvement. S/p repair of aortic dissection with placement of mechanical AVR.  [name] Obesity   . H/O mechanical aortic valve replacement      Most Recent Cardiac Studies: 2D Echo 02/2013 - Left ventricle: The cavity size was normal. Wall thickness was increased in a pattern of moderate LVH. Systolic function was normal. The estimated ejection fraction was in the range of 55% to 60%. Wall motion was normal; there were no regional wall motion abnormalities. - Aortic valve: A mechanical prosthesis was present. Mild regurgitation. - Mitral valve: Mild regurgitation. - Left atrium: The atrium was moderately dilated. - Right ventricle: The cavity size was mildly dilated. - Right atrium: The atrium was moderately dilated. Impressions: - Normal LV function; prosthetic aortic valve with normal mean gradient of 21 mmHg and mild AI.   Surgical History:  Past Surgical History  Procedure Laterality Date  . Appendectomy  1998  . Coronary artery bypass graft  2000  . Aortic valve replacement  2000     Home Meds: Prior to Admission medications   Medication Sig Start Date End Date Taking? Authorizing Provider  aspirin 81 MG EC tablet Take 81 mg by mouth daily.     Yes Historical Provider, MD  Cholecalciferol (VITAMIN D3) 1000 UNITS CAPS Take 1,000 Units by mouth daily.    Yes Historical Provider, MD  cloNIDine (CATAPRES) 0.1 MG tablet Take 0.1 mg by mouth 2 (two) times daily.   Yes Historical Provider, MD  furosemide (LASIX) 40 MG tablet Take 40 mg by mouth daily.   Yes Historical Provider, MD  glimepiride (AMARYL) 1 MG tablet Take 1 mg by mouth daily with breakfast.   Yes Historical Provider, MD  losartan (COZAAR) 100 MG tablet Take 100 mg by mouth daily.   Yes Historical Provider, MD  metFORMIN (GLUCOPHAGE) 1000 MG tablet Take 1,000 mg by  mouth 2 (two) times daily with a meal.   Yes Historical Provider, MD  metoprolol (TOPROL-XL) 200 MG 24 hr tablet Take 200 mg by mouth daily.     Yes Historical Provider, MD  potassium chloride SA (K-DUR,KLOR-CON) 20 MEQ tablet Take 20 mEq by mouth 2 (two) times daily.   Yes Historical Provider, MD  pravastatin (PRAVACHOL) 40 MG tablet Take 40 mg by mouth daily.   Yes Historical Provider, MD  warfarin (COUMADIN) 5 MG tablet Take 5-10 mg by mouth daily. Takes 10mg  Thurs and Sun.  5mg  all other days 05/30/13  Yes Deboraha Sprang, MD    Allergies:  Allergies  Allergen Reactions  . Insulin Glargine Swelling    edema    History   Social History  . Marital Status: Married    Spouse Name: N/A    Number of Children: 4  . Years of Education: N/A   Occupational History  . DISABLED  Social History Main Topics  . Smoking status: Never Smoker   . Smokeless tobacco: Not on file  . Alcohol Use: No  . Drug Use: No  . Sexual Activity: Not on file   Other Topics Concern  . Not on file   Social History Narrative  . No narrative on file     Family History  Problem Relation Age of Onset  . Coronary artery disease Mother   . Hypertension Mother   . Diabetes Brother   . Coronary artery disease Other 6    male 1st degree relative, CABG in his 50's (father)    Review of Systems: General: negative for chills, fever, night sweats or weight changes.  Cardiovascular: see above Dermatological: negative for rash Respiratory: negative for cough or wheezing Urologic: negative for hematuria Abdominal: negative for nausea, vomiting, diarrhea, bright red blood per rectum, melena, or hematemesis Neurologic: negative for visual changes, syncope All other systems reviewed and are otherwise negative except as noted above.  Labs:   Lab Results  Component Value Date   WBC 5.9 07/19/2013   HGB 12.7* 07/19/2013   HCT 35.6* 07/19/2013   MCV 80.2 07/19/2013   PLT 157 07/19/2013     Recent Labs Lab  07/19/13 0932  NA 141  K 4.0  CL 105  CO2 26  BUN 13  CREATININE 0.97  CALCIUM 9.0  PROT 7.7  BILITOT 1.0  ALKPHOS 53  ALT 18  AST 25  GLUCOSE 123*    Recent Labs  07/19/13 0932  TROPONINI <0.30   Lab Results  Component Value Date   CHOL 153 05/09/2013   HDL 31.20* 05/09/2013   LDLCALC 84 05/09/2013   TRIG 187.0* 05/09/2013     Radiology/Studies:  Dg Chest Port 1 View 07/19/2013   CLINICAL DATA:  48 year old male shortness of breath and chest pain and tightness. Initial encounter.  EXAM: PORTABLE CHEST - 1 VIEW  COMPARISON:  06/25/2013 and earlier.  FINDINGS: Portable AP semi upright view at 0915 hrs. Stable cardiomegaly and mediastinal contours. Sequelae of cardiac valve replacement. No pneumothorax or pleural effusion. Interval increased interstitial opacity. No consolidation.  IMPRESSION: Stable cardiomegaly. Interval increased interstitial opacity favored to reflect vascular congestion/ mild interstitial edema. Viral/ atypical respiratory infection less likely.   Electronically Signed   By: Lars Pinks M.D.   On: 07/19/2013 09:23     EKG: NSR 83bpm nonspecific T wave changes, no acute ST changes. Previously he had more pronounced inf/lat TWI that is not significantly seen on today's ECG Repeat ECG during CP: NSR 86bpm ventricular bigeminy, otherwise similar to above.  Physical Exam: Blood pressure 125/90, pulse 73, temperature 98.2 F (36.8 C), temperature source Oral, resp. rate 21, height 6\' 2"  (1.88 m), weight 297 lb (134.718 kg), SpO2 96.00%. General: Well developed, well nourished built AAM in no acute distress. Head: Normocephalic, atraumatic, sclera non-icteric, no xanthomas, nares are without discharge.  Neck: JVD not elevated. Lungs: Clear bilaterally to auscultation without wheezes, rales, or rhonchi. Breathing is unlabored. Heart: RRR with S1 S2. Prominent systolic click with possible mild diastolic murmur. No rubs or gallops appreciated. Abdomen: Soft,  non-tender, non-distended with normoactive bowel sounds. No hepatomegaly. No rebound/guarding. No obvious abdominal masses. Msk:  Strength and tone appear normal for age. Extremities: No clubbing or cyanosis. Tr edema.  Distal pedal pulses are 2+ and equal bilaterally. Neuro: Alert and oriented X 3. No focal deficit. No facial asymmetry. Moves all extremities spontaneously. Psych:  Responds to questions appropriately with  a normal affect.    ASSESSMENT AND PLAN:  1. Chest pain/dyspnea 2. Ascending aortic dissection in 2000 with aortic valve involvement s/p repair and mechanical AVR 3. Markedly subtherapeutic INR 4. HTN 5. PAF, currently maintaining NSR 6. Diabetes mellitus 7. H/o TIA, 2004  See below for comprehensive thoughts.  Signed, Melina Copa PA-C 07/19/2013, 2:41 PM

## 2013-07-19 NOTE — ED Notes (Signed)
Correction Note- PT's aortic valve is mechanical heart valve, not electronic.

## 2013-07-19 NOTE — Progress Notes (Signed)
ANTICOAGULATION CONSULT NOTE - Initial Consult  Pharmacy Consult for Heparin Indication: chest pain/ACS, mechanical AVR  Allergies  Allergen Reactions  . Insulin Glargine Swelling    edema    Patient Measurements: Height: 6\' 2"  (188 cm) Weight: 297 lb (134.718 kg) IBW/kg (Calculated) : 82.2 Heparin Dosing Weight:  112.3 kg   Vital Signs: Temp: 98.2 F (36.8 C) (02/27 0901) Temp src: Oral (02/27 0901) BP: 138/91 mmHg (02/27 1600) Pulse Rate: 82 (02/27 1757)  Labs:  Recent Labs  07/19/13 0932  HGB 12.7*  HCT 35.6*  PLT 157  LABPROT 15.7*  INR 1.28  CREATININE 0.97  TROPONINI <0.30    Estimated Creatinine Clearance: 135.9 ml/min (by C-G formula based on Cr of 0.97).   Medical History: Past Medical History  Diagnosis Date  . Diabetes mellitus   . Hypertension   . Unspecified vitamin D deficiency   . Hyperlipidemia   . H/O cardiovascular stress test     a. Myoview 2011: walked for 80min, images with EF 39%, prominent inf scar, mild reversibility. b. Cardiac CT 06/2010: no CAD, only mild plaque in prox RCA. c. EF normal by echo 2014.  [name] hematuria   . TRANSIENT ISCHEMIC ATTACK, HX OF     a. In 2004.  [name] NEPHROLITHIASIS, HX OF   . PAF (paroxysmal atrial fibrillation)     a. H/o such, with recurrence of coarse afib/flutter during ER consult 03/2012.  . Ascending aortic dissection     a. 2000 - with aortic valve involvement. S/p repair of aortic dissection with placement of mechanical AVR.  [name] Obesity   . H/O mechanical aortic valve replacement     Medications:  ASA81, Vit D, Clonidine, Lasix, Amaryl, Losartan, metformin, Toprol XL, K+, Pravacho, Coumadin 5mg  daily except 10mg  Thur/Sun.  Assessment: Radiating CP, SOB, also with h/o afib and mechanical AVR 48 y/o active M with significant PMH including HTN, HLD, DM, h/o TIA 2004, PAF, obesity and aortic dissection in 2000 s/p mechanical AVR. His INR is almost normal for some reason. He just got cath and  angiomax will run for 3hr post cath. D/w Dr. Irish Lack, will restart his coumadin tonight and restart his IV heparin after the angiomax is done infusing.    Goal of Therapy:  Heparin level 0.3-0.7 units/ml Monitor platelets by anticoagulation protocol: Yes   Plan:   Coumadin 10mg  PO x1 Daily INR Restart heparin at 1750 units/hr around midnight Check heparin level in AM and daily

## 2013-07-19 NOTE — ED Provider Notes (Signed)
CSN: [id number]     Arrival date & time 07/19/13  [id number] History   First MD Initiated Contact with Patient 07/19/13 0845     Chief Complaint  Patient presents with  . Shortness of Breath  . Chest Pain     (Consider location/radiation/quality/duration/timing/severity/associated sxs/prior Treatment) The history is provided by the patient.   patient is a 48 year old male followed by Cathlean Cower. Patient presents with a complaint of shortness of breath central chest. Radiating to the left side of the chest with nausea lightheadedness and headache beginning yesterday evening. Patient states the feeling in his chest is the same sensation of pressure and tightness that he had when he went to his medical doctor in 2000, cord and had an aortic valve replacement. He has a mechanical aortic valve. Patient states she was here a few weeks ago because he wasn't feeling well but stated he felt better before the onset of these new symptoms. Patient was seen in the emergency Department February 3 for chest pain and was discharged home. This chest pain is more intense and more severe. Patient rates it as a 10 out of 10. Associated with shortness of breath lightheadedness and some back pain. Also with a headache. Chest pain substernal radiates to the left chest started yesterday got worse today currently 8/10. At its worst it was 10 out of 10.  Past Medical History  Diagnosis Date  . Diabetes mellitus   . Hypertension   . Unspecified vitamin D deficiency 08/07/2008  . HYPERLIPIDEMIA 08/20/2007  . CAD 05/21/2007  . Aortic valve disorders 09/30/2009  . Gross hematuria 08/07/2008  . TRANSIENT ISCHEMIC ATTACK, HX OF 12/26/2007  . NEPHROLITHIASIS, HX OF 05/21/2007  . Atrial fibrillation 07/27/2010   Past Surgical History  Procedure Laterality Date  . Appendectomy  1998  . Coronary artery bypass graft  2000  . Aortic valve replacement  2000   Family History  Problem Relation Age of Onset  . Coronary artery disease  Mother   . Hypertension Mother   . Diabetes Brother   . Coronary artery disease Other 69    male 1st degree relative, CABG in his 104's (father)   History  Substance Use Topics  . Smoking status: Never Smoker   . Smokeless tobacco: Not on file  . Alcohol Use: No    Review of Systems  Constitutional: Positive for fatigue. Negative for fever.  HENT: Negative for congestion.   Eyes: Negative for visual disturbance.  Respiratory: Positive for shortness of breath.   Cardiovascular: Positive for chest pain.  Gastrointestinal: Negative for nausea, vomiting and abdominal pain.  Genitourinary: Negative for dysuria.  Musculoskeletal: Positive for back pain.  Skin: Negative for rash.  Neurological: Positive for light-headedness and headaches.  Hematological: Does not bruise/bleed easily.  Psychiatric/Behavioral: Negative for confusion.      Allergies  Insulin glargine  Home Medications   Current Outpatient Rx  Name  Route  Sig  Dispense  Refill  . aspirin 81 MG EC tablet   Oral   Take 81 mg by mouth daily.           . Cholecalciferol (VITAMIN D3) 1000 UNITS CAPS   Oral   Take 1,000 Units by mouth daily.          . cloNIDine (CATAPRES) 0.1 MG tablet   Oral   Take 0.1 mg by mouth 2 (two) times daily.         . furosemide (LASIX) 40 MG tablet   Oral  Take 40 mg by mouth daily.         [name] glimepiride (AMARYL) 1 MG tablet   Oral   Take 1 mg by mouth daily with breakfast.         . losartan (COZAAR) 100 MG tablet   Oral   Take 100 mg by mouth daily.         . metFORMIN (GLUCOPHAGE) 1000 MG tablet   Oral   Take 1,000 mg by mouth 2 (two) times daily with a meal.         . metoprolol (TOPROL-XL) 200 MG 24 hr tablet   Oral   Take 200 mg by mouth daily.           . potassium chloride SA (K-DUR,KLOR-CON) 20 MEQ tablet   Oral   Take 20 mEq by mouth 2 (two) times daily.         . pravastatin (PRAVACHOL) 40 MG tablet   Oral   Take 40 mg by mouth  daily.         [name] warfarin (COUMADIN) 5 MG tablet   Oral   Take 5-10 mg by mouth daily. Takes 10mg  Thurs and Sun.  5mg  all other days          BP 144/93  Pulse 78  Temp(Src) 98.2 F (36.8 C) (Oral)  Resp 15  Ht 6\' 2"  (1.88 m)  Wt 297 lb (134.718 kg)  BMI 38.12 kg/m2  SpO2 98% Physical Exam  Nursing note and vitals reviewed. Constitutional: He is oriented to person, place, and time. He appears well-developed and well-nourished. No distress.  HENT:  Head: Normocephalic and atraumatic.  Mouth/Throat: Oropharynx is clear and moist.  Eyes: Conjunctivae and EOM are normal. Pupils are equal, round, and reactive to light.  Neck: Normal range of motion.  Cardiovascular: Normal rate, regular rhythm and normal heart sounds.   No murmur heard. Pulmonary/Chest: Effort normal and breath sounds normal. No respiratory distress.  Abdominal: Soft. Bowel sounds are normal. There is no tenderness.  Musculoskeletal: Normal range of motion.  Neurological: He is alert and oriented to person, place, and time. No cranial nerve deficit. He exhibits normal muscle tone. Coordination normal.  Skin: Skin is warm. No rash noted.    ED Course  Procedures (including critical care time) Labs Review Labs Reviewed  COMPREHENSIVE METABOLIC PANEL - Abnormal; Notable for the following:    Glucose, Bld 123 (*)    All other components within normal limits  CBC WITH DIFFERENTIAL - Abnormal; Notable for the following:    Hemoglobin 12.7 (*)    HCT 35.6 (*)    RDW 15.6 (*)    All other components within normal limits  PRO B NATRIURETIC PEPTIDE - Abnormal; Notable for the following:    Pro B Natriuretic peptide (BNP) 912.2 (*)    All other components within normal limits  PROTIME-INR - Abnormal; Notable for the following:    Prothrombin Time 15.7 (*)    All other components within normal limits  TROPONIN I   Results for orders placed during the hospital encounter of 07/19/13  COMPREHENSIVE METABOLIC  PANEL      Result Value Ref Range   Sodium 141  137 - 147 mEq/L   Potassium 4.0  3.7 - 5.3 mEq/L   Chloride 105  96 - 112 mEq/L   CO2 26  19 - 32 mEq/L   Glucose, Bld 123 (*) 70 - 99 mg/dL   BUN 13  6 - 23 mg/dL  Creatinine, Ser 0.97  0.50 - 1.35 mg/dL   Calcium 9.0  8.4 - 10.5 mg/dL   Total Protein 7.7  6.0 - 8.3 g/dL   Albumin 3.5  3.5 - 5.2 g/dL   AST 25  0 - 37 U/L   ALT 18  0 - 53 U/L   Alkaline Phosphatase 53  39 - 117 U/L   Total Bilirubin 1.0  0.3 - 1.2 mg/dL   GFR calc non Af Amer >90  >90 mL/min   GFR calc Af Amer >90  >90 mL/min  CBC WITH DIFFERENTIAL      Result Value Ref Range   WBC 5.9  4.0 - 10.5 K/uL   RBC 4.44  4.22 - 5.81 MIL/uL   Hemoglobin 12.7 (*) 13.0 - 17.0 g/dL   HCT 35.6 (*) 39.0 - 52.0 %   MCV 80.2  78.0 - 100.0 fL   MCH 28.6  26.0 - 34.0 pg   MCHC 35.7  30.0 - 36.0 g/dL   RDW 15.6 (*) 11.5 - 15.5 %   Platelets 157  150 - 400 K/uL   Neutrophils Relative % 63  43 - 77 %   Neutro Abs 3.7  1.7 - 7.7 K/uL   Lymphocytes Relative 25  12 - 46 %   Lymphs Abs 1.5  0.7 - 4.0 K/uL   Monocytes Relative 8  3 - 12 %   Monocytes Absolute 0.5  0.1 - 1.0 K/uL   Eosinophils Relative 4  0 - 5 %   Eosinophils Absolute 0.2  0.0 - 0.7 K/uL   Basophils Relative 0  0 - 1 %   Basophils Absolute 0.0  0.0 - 0.1 K/uL  TROPONIN I      Result Value Ref Range   Troponin I <0.30  <0.30 ng/mL  PRO B NATRIURETIC PEPTIDE      Result Value Ref Range   Pro B Natriuretic peptide (BNP) 912.2 (*) 0 - 125 pg/mL  PROTIME-INR      Result Value Ref Range   Prothrombin Time 15.7 (*) 11.6 - 15.2 seconds   INR 1.28  0.00 - 1.49    Imaging Review Dg Chest Port 1 View  07/19/2013   CLINICAL DATA:  48 year old male shortness of breath and chest pain and tightness. Initial encounter.  EXAM: PORTABLE CHEST - 1 VIEW  COMPARISON:  06/25/2013 and earlier.  FINDINGS: Portable AP semi upright view at 0915 hrs. Stable cardiomegaly and mediastinal contours. Sequelae of cardiac valve replacement.  No pneumothorax or pleural effusion. Interval increased interstitial opacity. No consolidation.  IMPRESSION: Stable cardiomegaly. Interval increased interstitial opacity favored to reflect vascular congestion/ mild interstitial edema. Viral/ atypical respiratory infection less likely.   Electronically Signed   By: Lars Pinks M.D.   On: 07/19/2013 09:23    @NOMUSE @   Date: 07/19/2013  Rate: 83  Rhythm: normal sinus rhythm  QRS Axis: normal  Intervals: normal  ST/T Wave abnormalities: nonspecific T wave changes  Conduction Disutrbances:none  Narrative Interpretation:   Old EKG Reviewed: unchanged  No sniffing change in EKG compared to 06/25/2013.    MDM   Final diagnoses:  Chest pain    Patient evaluated for chest pain beginning of February now with worsening chest pain will require admission. She'll workup including chest x-ray shows stable cardiomegaly. Possible mild interstitial edema. Troponin negative patient is followed by LV cardiology discussed with them they will see and admit. No evidence of acute cardiac event here today.    Alvine Mostafa  Lenard Lance, MD 07/22/13 [phone]

## 2013-07-19 NOTE — Progress Notes (Signed)
Echocardiogram 2D Echocardiogram has been performed.  Joelene Millin 07/19/2013, 4:28 PM

## 2013-07-19 NOTE — ED Notes (Signed)
PT reports SOB, Central CP radiating to L side of chest with nausea, lightheadedness, and HA beginning yesterday evening. PT states the feeling in his chest is the same sensation of pressure and tightness that he had when he went to the MD in 2000 for pain and was told he needed an aortic valve replacement. PT has medtronic electric aortic valve since 2000. PT states he was here a few weeks ago because he wasn't feeling well, but stated he felt better before the onset of these new symptoms.

## 2013-07-19 NOTE — ED Notes (Signed)
MD at bedside.Zackowski 

## 2013-07-19 NOTE — Progress Notes (Signed)
ANTICOAGULATION CONSULT NOTE - Initial Consult  Pharmacy Consult for Heparin Indication: chest pain/ACS, mechanical AVR  Allergies  Allergen Reactions  . Insulin Glargine Swelling    edema    Patient Measurements: Height: 6\' 2"  (188 cm) Weight: 297 lb (134.718 kg) IBW/kg (Calculated) : 82.2 Heparin Dosing Weight:  112.3 kg   Vital Signs: Temp: 98.2 F (36.8 C) (02/27 0901) Temp src: Oral (02/27 0901) BP: 144/95 mmHg (02/27 1415) Pulse Rate: 76 (02/27 1415)  Labs:  Recent Labs  07/19/13 0932  HGB 12.7*  HCT 35.6*  PLT 157  LABPROT 15.7*  INR 1.28  CREATININE 0.97  TROPONINI <0.30    Estimated Creatinine Clearance: 135.9 ml/min (by C-G formula based on Cr of 0.97).   Medical History: Past Medical History  Diagnosis Date  . Diabetes mellitus   . Hypertension   . Unspecified vitamin D deficiency   . Hyperlipidemia   . H/O cardiovascular stress test     a. Myoview 2011: walked for 58min, images with EF 39%, prominent inf scar, mild reversibility. b. Cardiac CT 06/2010: no CAD, only mild plaque in prox RCA. c. EF normal by echo 2014.  [name] hematuria   . TRANSIENT ISCHEMIC ATTACK, HX OF     a. In 2004.  [name] NEPHROLITHIASIS, HX OF   . PAF (paroxysmal atrial fibrillation)     a. H/o such, with recurrence of coarse afib/flutter during ER consult 03/2012.  . Ascending aortic dissection     a. 2000 - with aortic valve involvement. S/p repair of aortic dissection with placement of mechanical AVR.  [name] Obesity   . H/O mechanical aortic valve replacement     Medications:  ASA81, Vit D, Clonidine, Lasix, Amaryl, Losartan, metformin, Toprol XL, K+, Pravacho, Coumadin 5mg  daily except 10mg  Thur/Sun.  Assessment: Radiating CP, SOB, also with h/o afib and mechanical AVR 48 y/o active M with significant PMH including HTN, HLD, DM, h/o TIA 2004, PAF, obesity and aortic dissection in 2000 s/p mechanical AVR. Last EF 10/14 50-55%  Labs: Scr 0.97, Hgb 12.7, Platelets 157,  proBNP 912, INR 1.28  Goal of Therapy:  Heparin level 0.3-0.7 units/ml Monitor platelets by anticoagulation protocol: Yes   Plan:  Heparin 4000 unit IV bolus Heparin infusion 1750 units/hr Check heparin level in 6 hrs Daily HL and CBC   Lexxie Winberg S. Alford Highland, PharmD, BCPS Clinical Staff Pharmacist Pager [phone]  Eilene Ghazi Stillinger 07/19/2013,3:03 PM

## 2013-07-19 NOTE — Progress Notes (Signed)
  eCHO mild LV dysfunction 40-50% ( by my eye) Also Gradient mean 36<<21 in 10/14   4 m/sec jet ? Aortic valve function Spoke w JV 

## 2013-07-19 NOTE — H&P (View-Only) (Signed)
  eCHO mild LV dysfunction 40-50% ( by my eye) Also Gradient mean 36<<21 in 10/14   4 m/sec jet ? Aortic valve function Spoke w JV

## 2013-07-19 NOTE — CV Procedure (Signed)
PROCEDURE:  Selective coronary angiography, ascending aortogram.  Fluoroscopy of the prosthetic aortic valve. Bypass graft angiography. PCI of the SVG to RCA with distal embolic protection.  INDICATIONS:  Unstable angina  The risks, benefits, and details of the procedure were explained to the patient.  The patient verbalized understanding and wanted to proceed.  Informed written consent was obtained.  PROCEDURE TECHNIQUE:  After Xylocaine anesthesia a 74F sheath was placed in the right femoral artery with a single anterior needle wall stick.  Venous access to the right femoral vein was attempted but was unsuccessful. A pigtail catheter was used to perform aortography. Left coronary angiography was done using a Judkins L5 guide catheter. Multiple catheters were attempted without success. Right coronary angiography was attempted using a Judkins R4 guide catheter. A Williams right catheter was also attempted without success. Eventually, an AR-2 guide catheter was used to engage the SVG to PDA.  The intervention was performed.  An Angio-Seal was used for hemostasis.   CONTRAST:  Total of 235 cc.  COMPLICATIONS:  None.    HEMODYNAMICS:  Aortic pressure was 154/106.     ANGIOGRAPHIC DATA:   The left main coronary artery is widely patent.  The left anterior descending artery is a large vessel proximally. The LAD appears intragraft be normal and reaches the apex. There is a large first diagonal which is widely patent.  The left circumflex artery is large vessel which appears widely patent.  The first obtuse marginal is medium size and patent. Second and third obtuse marginals are large and widely patent..  The right coronary artery is occluded at the ostium.  The SVG to RCA has an ostial 90% stenosis with a 40 mm pressure gradient upon engagement of the catheter.  LEFT VENTRICULOGRAM:  Left ventricular angiogram was not done due to the prosthetic aortic valve.  PCI NARRATIVE: Angiomax was used  for anticoagulation. An AR-2 guiding catheter was used to engage the vein graft to the RCA. A pro-water wire was placed across the area disease in the ostial vein graft. A 6 mm spider embolic protection device was advanced to the mid graft. A 3.5 x 12 balloon was used to predilate the lesion. A 4.5 x 12 Rebel bare-metal stent was deployed. The stent was post dilated with a 5.0 x 8 noncompliant balloon. The pressure gradient was gone. It is difficult to get an angiographic result do to guide catheter issues. The balloons and stents appeared to fully inflate.  Flow through the graft was much improved. Prior to the intervention, there is significant dye hang up in the vein graft. This was gone at the end of the procedure. The basket was successfully removed.  IMPRESSIONS:  1. No significant left sided coronary artery disease. 2. Occluded native right coronary artery.  90% ostial stenosis in the SVG to RCA which was successfully treated with a 4.5 x 93mm rebel bare-metal stent, postdilated to 5.2 mm in diameter. 3. Fluoroscopy in multiple different views shows that the tilting disc aortic valve opens well. 4.  Ascending aortic angiogram reveals a dilated root with some residual dissection in the  arch likely just distal to the origin of the innominate.  RECOMMENDATION:  He was loaded with Brilinta to get a quick antiplatelet effect. We will continue Angiomax for 3 hours post procedure.  Long-term, he will get back on his Coumadin and will need Plavix for a month. During that time, I would hold off on aspirin. After a month, could consider stopping  the Plavix and restarting aspirin based on what he was on in the past. Continuing Coumadin and Plavix would not be out of the question, depending on his bleeding risk.

## 2013-07-19 NOTE — ED Notes (Signed)
Informed PA of the chest pain.Nitro to hold .

## 2013-07-19 NOTE — Interval H&P Note (Signed)
Cath Lab Visit (complete for each Cath Lab visit)  Clinical Evaluation Leading to the Procedure:   ACS: yes  Non-ACS:    Anginal Classification: CCS IV  Anti-ischemic medical therapy: No Therapy  Non-Invasive Test Results: No non-invasive testing performed  Prior CABG: No previous CABG      History and Physical Interval Note:  07/19/2013 5:02 PM  [name]  has presented today for surgery, with the diagnosis of cp  The various methods of treatment have been discussed with the patient and family. After consideration of risks, benefits and other options for treatment, the patient has consented Procedure(s): LEFT AND RIGHT HEART CATHETERIZATION WITH CORONARY ANGIOGRAM (N/A) as a surgical intervention .  The patient's history has been reviewed, patient examined, no change in status, stable for surgery.  I have reviewed the patient's chart and labs.  Questions were answered to the patient's satisfaction.   Fluoroscopy to be performed as well.  [name]

## 2013-07-19 NOTE — ED Notes (Signed)
Nurse is starting IV and said she will get the blood 

## 2013-07-19 NOTE — H&P (Signed)
48 yo with Past Hx as well outlined above with recurring ER visits for CP 3/44m and 2x/3weeks and now sob over the last 48 hrs characterized by DOE, orthopnea and mild edema in setting of subtherapeutic INR for last 3 weeks.  There is no change in heart sounds.  He does have hx of recurring tachypalpitations observed by pt and family (audible) assoc but not universally with SOB and not different from the past adn thus disconnected from current situation He has prior MI of which he is unaware,  But which has been present on his ECGs since 2011.  I presume but dont know whether it is related to his dissection  His subtherapeutic INR raises the spectre of other causes including embolism to his CA or valve malfunction.  The frequent PVC--sometimes bigeminy could also be relevant mediating a PVC myopathy  Spoke with Dr Saundra Shelling   Plan   1) stat echo look at valve 2) cath R and L with flouro to assess valve 3) IV heparin 4) Lasix one dose IV given

## 2013-07-20 DIAGNOSIS — I4891 Unspecified atrial fibrillation: Secondary | ICD-10-CM

## 2013-07-20 DIAGNOSIS — Z954 Presence of other heart-valve replacement: Secondary | ICD-10-CM

## 2013-07-20 DIAGNOSIS — I509 Heart failure, unspecified: Secondary | ICD-10-CM

## 2013-07-20 DIAGNOSIS — I5033 Acute on chronic diastolic (congestive) heart failure: Secondary | ICD-10-CM

## 2013-07-20 DIAGNOSIS — I200 Unstable angina: Secondary | ICD-10-CM

## 2013-07-20 LAB — CBC
HCT: 33.1 % — ABNORMAL LOW (ref 39.0–52.0)
HEMATOCRIT: 34.4 % — AB (ref 39.0–52.0)
HEMOGLOBIN: 12.5 g/dL — AB (ref 13.0–17.0)
HEMOGLOBIN: 11.8 g/dL — AB (ref 13.0–17.0)
MCH: 29 pg (ref 26.0–34.0)
MCH: 28.4 pg (ref 26.0–34.0)
MCHC: 36.3 g/dL — ABNORMAL HIGH (ref 30.0–36.0)
MCHC: 35.6 g/dL (ref 30.0–36.0)
MCV: 79.8 fL (ref 78.0–100.0)
MCV: 79.8 fL (ref 78.0–100.0)
PLATELETS: 167 10*3/uL (ref 150–400)
Platelets: 161 10*3/uL (ref 150–400)
RBC: 4.31 MIL/uL (ref 4.22–5.81)
RBC: 4.15 MIL/uL — AB (ref 4.22–5.81)
RDW: 15.6 % — ABNORMAL HIGH (ref 11.5–15.5)
RDW: 15.7 % — ABNORMAL HIGH (ref 11.5–15.5)
WBC: 8.1 10*3/uL (ref 4.0–10.5)
WBC: 8.7 10*3/uL (ref 4.0–10.5)

## 2013-07-20 LAB — BASIC METABOLIC PANEL
BUN: 12 mg/dL (ref 6–23)
CO2: 29 mEq/L (ref 19–32)
Calcium: 9 mg/dL (ref 8.4–10.5)
Chloride: 102 mEq/L (ref 96–112)
Creatinine, Ser: 1.07 mg/dL (ref 0.50–1.35)
GFR, EST NON AFRICAN AMERICAN: 80 mL/min — AB (ref >90)
Glucose, Bld: 125 mg/dL — ABNORMAL HIGH (ref 70–99)
POTASSIUM: 4.1 meq/L (ref 3.7–5.3)
SODIUM: 142 meq/L (ref 137–147)

## 2013-07-20 LAB — LIPID PANEL
Cholesterol: 134 mg/dL (ref 0–200)
HDL: 36 mg/dL — AB (ref >39)
LDL Cholesterol: 83 mg/dL (ref 0–99)
TRIGLYCERIDES: 76 mg/dL (ref <150)
Total CHOL/HDL Ratio: 3.7 RATIO
VLDL: 15 mg/dL (ref 0–40)

## 2013-07-20 LAB — HEPARIN LEVEL (UNFRACTIONATED)
HEPARIN UNFRACTIONATED: 0.16 [IU]/mL — AB (ref 0.30–0.70)
Heparin Unfractionated: 0.13 IU/mL — ABNORMAL LOW (ref 0.30–0.70)
Heparin Unfractionated: 0.36 IU/mL (ref 0.30–0.70)
Heparin Unfractionated: 0.54 IU/mL (ref 0.30–0.70)

## 2013-07-20 LAB — TROPONIN I

## 2013-07-20 LAB — GLUCOSE, CAPILLARY
GLUCOSE-CAPILLARY: 126 mg/dL — AB (ref 70–99)
GLUCOSE-CAPILLARY: 187 mg/dL — AB (ref 70–99)
Glucose-Capillary: 187 mg/dL — ABNORMAL HIGH (ref 70–99)
Glucose-Capillary: 170 mg/dL — ABNORMAL HIGH (ref 70–99)

## 2013-07-20 LAB — PROTIME-INR
INR: 1.38 (ref 0.00–1.49)
Prothrombin Time: 16.6 seconds — ABNORMAL HIGH (ref 11.6–15.2)

## 2013-07-20 LAB — TSH: TSH: 11.484 u[IU]/mL — ABNORMAL HIGH (ref 0.350–4.500)

## 2013-07-20 MED ORDER — ATORVASTATIN CALCIUM 40 MG PO TABS
40.0000 mg | ORAL_TABLET | Freq: Every day | ORAL | Status: DC
  Administered 2013-07-20 – 2013-07-24 (×5): 40 mg via ORAL
  Filled 2013-07-20 (×7): qty 1

## 2013-07-20 MED ORDER — WARFARIN SODIUM 10 MG PO TABS
10.0000 mg | ORAL_TABLET | Freq: Once | ORAL | Status: AC
  Administered 2013-07-20: 10 mg via ORAL
  Filled 2013-07-20: qty 1

## 2013-07-20 MED ORDER — FUROSEMIDE 40 MG PO TABS
40.0000 mg | ORAL_TABLET | Freq: Two times a day (BID) | ORAL | Status: DC
  Administered 2013-07-20 – 2013-07-21 (×2): 40 mg via ORAL
  Filled 2013-07-20 (×4): qty 1

## 2013-07-20 NOTE — Progress Notes (Signed)
ANTICOAGULATION CONSULT NOTE - Follow Up Consult  Pharmacy Consult for heparin Indication: afib; mechanical valve  Allergies  Allergen Reactions  . Insulin Glargine Swelling    edema    Patient Measurements: Height: 6\' 2"  (188 cm) Weight: 309 lb 14.4 oz (140.57 kg) (scale C) IBW/kg (Calculated) : 82.2 Heparin Dosing Weight:   Vital Signs: Temp: 98.5 F (36.9 C) (02/28 2011) Temp src: Oral (02/28 2011) BP: 120/67 mmHg (02/28 2011) Pulse Rate: 66 (02/28 2011)  Labs:  Recent Labs  07/19/13 0932 07/19/13 2055  07/20/13 0251 07/20/13 0551 07/20/13 0913 07/20/13 1400 07/20/13 2021  HGB 12.7*  --   --  12.5* 11.8*  --   --   --   HCT 35.6*  --   --  34.4* 33.1*  --   --   --   PLT 157  --   --  161 167  --   --   --   LABPROT 15.7*  --   --   --  16.6*  --   --   --   INR 1.28  --   --   --  1.38  --   --   --   HEPARINUNFRC  --   --   < > 0.13* 0.16*  --  0.36 0.54  CREATININE 0.97  --   --  1.07  --   --   --   --   TROPONINI <0.30 <0.30  --  <0.30  --  <0.30  --   --   < > = values in this interval not displayed.  Estimated Creatinine Clearance: 126.1 ml/min (by C-G formula based on Cr of 1.07).   Medications:  Scheduled:  . atorvastatin  40 mg Oral q1800  . cholecalciferol  1,000 Units Oral Daily  . cloNIDine  0.1 mg Oral BID  . clopidogrel  75 mg Oral Q breakfast  . furosemide  40 mg Oral BID  . glimepiride  1 mg Oral Q breakfast  . insulin aspart  0-9 Units Subcutaneous TID WC  . losartan  100 mg Oral Daily  . metoprolol  200 mg Oral Daily  . potassium chloride SA  20 mEq Oral BID  . sodium chloride  3 mL Intravenous Q12H  . Warfarin - Pharmacist Dosing Inpatient   Does not apply q1800   Infusions:  . sodium chloride 10 mL/hr (07/19/13 2121)  . heparin 2,050 Units/hr (07/20/13 1318)    Assessment: 48 yo male with afib and mechanical heart valve is currently on therapeutic heparin.  Heparin level was 0.54 Goal of Therapy:  Heparin level 0.3-0.7  units/ml Monitor platelets by anticoagulation protocol: Yes   Plan:  1) Continue heparin at 2050 units/hr 2) Daily heparin level and CBC  Rihaan Barrack, Tsz-Yin 07/20/2013,9:01 PM

## 2013-07-20 NOTE — Progress Notes (Addendum)
Patient ID: [name], male   DOB: 30-Sep-1965, 48 y.o.   MRN: [id number]   SUBJECTIVE: No chest pain or dyspnea this morning.  Mild right groin pain.   Scheduled Meds: . atorvastatin  40 mg Oral q1800  . cholecalciferol  1,000 Units Oral Daily  . cloNIDine  0.1 mg Oral BID  . clopidogrel  75 mg Oral Q breakfast  . furosemide  40 mg Oral BID  . glimepiride  1 mg Oral Q breakfast  . insulin aspart  0-9 Units Subcutaneous TID WC  . losartan  100 mg Oral Daily  . metoprolol  200 mg Oral Daily  . potassium chloride SA  20 mEq Oral BID  . sodium chloride  3 mL Intravenous Q12H  . warfarin  10 mg Oral ONCE-1800  . Warfarin - Pharmacist Dosing Inpatient   Does not apply q1800   Continuous Infusions: . sodium chloride 10 mL/hr (07/19/13 2121)  . heparin 2,050 Units/hr (07/20/13 0741)   PRN Meds:.sodium chloride, hydrALAZINE, nitroGLYCERIN, sodium chloride    Filed Vitals:   07/20/13 0330 07/20/13 0400 07/20/13 0800 07/20/13 0828  BP:  113/78 136/83   Pulse:  79  84  Temp: 98.8 F (37.1 C)   99.2 F (37.3 C)  TempSrc: Oral   Oral  Resp:  25  27  Height:      Weight: 309 lb 1.4 oz (140.2 kg)     SpO2:  93%  96%    Intake/Output Summary (Last 24 hours) at 07/20/13 1049 Last data filed at 07/20/13 0900  Gross per 24 hour  Intake 667.67 ml  Output   4120 ml  Net -3452.33 ml    LABS: Basic Metabolic Panel:  Recent Labs  07/19/13 0932 07/20/13 0251  NA 141 142  K 4.0 4.1  CL 105 102  CO2 26 29  GLUCOSE 123* 125*  BUN 13 12  CREATININE 0.97 1.07  CALCIUM 9.0 9.0   Liver Function Tests:  Recent Labs  07/19/13 0932 07/19/13 2055  AST 25 26  ALT 18 20  ALKPHOS 53 60  BILITOT 1.0 1.5*  PROT 7.7 8.2  ALBUMIN 3.5 3.7   No results found for this basename: LIPASE, AMYLASE,  in the last 72 hours CBC:  Recent Labs  07/19/13 0932 07/20/13 0251 07/20/13 0551  WBC 5.9 8.1 8.7  NEUTROABS 3.7  --   --   HGB 12.7* 12.5* 11.8*  HCT 35.6* 34.4* 33.1*  MCV  80.2 79.8 79.8  PLT 157 161 167   Cardiac Enzymes:  Recent Labs  07/19/13 2055 07/20/13 0251 07/20/13 0913  TROPONINI <0.30 <0.30 <0.30   BNP: No components found with this basename: POCBNP,  D-Dimer: No results found for this basename: DDIMER,  in the last 72 hours Hemoglobin A1C: No results found for this basename: HGBA1C,  in the last 72 hours Fasting Lipid Panel:  Recent Labs  07/20/13 0551  CHOL 134  HDL 36*  LDLCALC 83  TRIG 76  CHOLHDL 3.7   Thyroid Function Tests:  Recent Labs  07/19/13 2055  TSH 11.484*   Anemia Panel: No results found for this basename: VITAMINB12, FOLATE, FERRITIN, TIBC, IRON, RETICCTPCT,  in the last 72 hours  RADIOLOGY: Dg Chest 2 View  06/25/2013   CLINICAL DATA:  Chest pain  EXAM: CHEST  2 VIEW  COMPARISON:  March 23, 2013  FINDINGS: Lungs are clear. Heart is mildly enlarged with normal pulmonary vascularity. Patient is status post aortic valve replacement. No adenopathy.  No pneumothorax. No bone lesions.  IMPRESSION: Mild cardiac enlargement with prosthetic aortic valve present. No edema or consolidation.   Electronically Signed   By: Lowella Grip M.D.   On: 06/25/2013 09:06   Dg Chest Port 1 View  07/19/2013   CLINICAL DATA:  48 year old male shortness of breath and chest pain and tightness. Initial encounter.  EXAM: PORTABLE CHEST - 1 VIEW  COMPARISON:  06/25/2013 and earlier.  FINDINGS: Portable AP semi upright view at 0915 hrs. Stable cardiomegaly and mediastinal contours. Sequelae of cardiac valve replacement. No pneumothorax or pleural effusion. Interval increased interstitial opacity. No consolidation.  IMPRESSION: Stable cardiomegaly. Interval increased interstitial opacity favored to reflect vascular congestion/ mild interstitial edema. Viral/ atypical respiratory infection less likely.   Electronically Signed   By: Lars Pinks M.D.   On: 07/19/2013 09:23    PHYSICAL EXAM General: NAD Neck: JVP 8-9 cm, no thyromegaly or  thyroid nodule.  Lungs: Clear to auscultation bilaterally with normal respiratory effort. CV: Nondisplaced PMI.  Heart regular S1/S2 with mechanical S2, no S3/S4, 2/6 early SEM.  No peripheral edema.    Abdomen: Soft, nontender, no hepatosplenomegaly, no distention.  Neurologic: Alert and oriented x 3.  Psych: Normal affect. Extremities: No clubbing or cyanosis.   TELEMETRY: Reviewed telemetry pt in NSR  ASSESSMENT AND PLAN: 48 yo with history of aortic dissection s/p mechanical AVR, SVG-RCA, and ascending aorta replacement presented with chest pain and dyspnea.  He had LHC yesterday with 90% SVG-PDA.  He had BMS to SVG-PDA.  1. CAD: Unstable angina with BMS to SVG-PDA.  No further chest pain.   - Continue coumadin + Plavix x 1 month (hold aspirin).  - After 1 month, can stop Plavix and resume aspirin.  - Will use more intensive statin.  - Continue ARB and Toprol XL.  2. Mechanical aortic valve: The mean gradient across the valve on recent echo was 36 mmHg, this is higher than prior and concerning as INR was therapeutic at admission.  However, fluoroscopy of valve yesterday showed normal mechanical valve function, doubt partial thrombosis, etc.   - Heparin gtt as bridge to therapeutic INR.  Can consider transition to Lovenox so patient can be discharged . - Plavix for now, resume ASA 81 for valve after 1 month when heparin stopped.  3. Acute on chronic diastolic CHF: Patient is mildly volume overloaded on exam.  EF 55-60% on echo.  Will increase Lasix to bid.  4. High TSH: Will repeat TSH and check free T3 and T4.   Patient may transfer to telemetry.   Loralie Champagne 07/20/2013 10:57 AM

## 2013-07-20 NOTE — Progress Notes (Signed)
Utilization Review Completed.   Reegan Mctighe, RN, BSN Nurse Case Manager  

## 2013-07-20 NOTE — Progress Notes (Signed)
CARDIAC REHAB PHASE I   PRE:  Rate/Rhythm: 72 sinus  BP:  Supine:   Sitting: 120/64  Standing:    SaO2: 95%ra  MODE:  Ambulation: 460 ft   POST:  Rate/Rhythem: 98 sinus  BP:  Supine:   Sitting: 110/78  Standing:    SaO2: 98% ra  Pt ambulated in hallway without difficulty, slow steady gait.  Pt stent booklet and angioseal booklet given.  Pt education completed including proper NTG use, medications, when to call 91, heart healthy diet, activity progression, and outpatient cardiac rehab. At pt request referral will be sent to [facility].  Understanding verbalized  [name]

## 2013-07-20 NOTE — Progress Notes (Signed)
ANTICOAGULATION CONSULT NOTE - Follow Up Consult  Pharmacy Consult for Heparin/Warfarin Indication: PAF, ACS, and mechanical AVR  Allergies  Allergen Reactions  . Insulin Glargine Swelling    edema    Patient Measurements: Height: 6\' 2"  (188 cm) Weight: 309 lb 14.4 oz (140.57 kg) (scale C) IBW/kg (Calculated) : 82.2 Heparin Dosing Weight: 114  Vital Signs: Temp: 97.8 F (36.6 C) (02/28 1224) Temp src: Oral (02/28 1224) BP: 133/83 mmHg (02/28 1224) Pulse Rate: 69 (02/28 1224)  Labs:  Recent Labs  07/19/13 0932 07/19/13 2055 07/20/13 0251 07/20/13 0551 07/20/13 0913 07/20/13 1400  HGB 12.7*  --  12.5* 11.8*  --   --   HCT 35.6*  --  34.4* 33.1*  --   --   PLT 157  --  161 167  --   --   LABPROT 15.7*  --   --  16.6*  --   --   INR 1.28  --   --  1.38  --   --   HEPARINUNFRC  --   --  0.13* 0.16*  --  0.36  CREATININE 0.97  --  1.07  --   --   --   TROPONINI <0.30 <0.30 <0.30  --  <0.30  --     Estimated Creatinine Clearance: 126.1 ml/min (by C-G formula based on Cr of 1.07).   Medications:  Scheduled:  . atorvastatin  40 mg Oral q1800  . cholecalciferol  1,000 Units Oral Daily  . cloNIDine  0.1 mg Oral BID  . clopidogrel  75 mg Oral Q breakfast  . furosemide  40 mg Oral BID  . glimepiride  1 mg Oral Q breakfast  . insulin aspart  0-9 Units Subcutaneous TID WC  . losartan  100 mg Oral Daily  . metoprolol  200 mg Oral Daily  . potassium chloride SA  20 mEq Oral BID  . sodium chloride  3 mL Intravenous Q12H  . warfarin  10 mg Oral ONCE-1800  . Warfarin - Pharmacist Dosing Inpatient   Does not apply q1800    Assessment: 48 yo M presented with radiating CP, SOB. Patient has h/o afib and mechanical AVR. S/p cath for BMS of RCA on 2/27. Note patient is on several blood thinning agents. Patients HL and INR were subtherapeutic this AM, renal function stable, and CBC wnl. No signs of bleeding noted.    Heparin redosed and recheck level is therapeutic at 0.36,  will continue current dose and recheck therapeutic level in 6 hours  Goal of Therapy:  INR 2-3 Heparin level 0.3-0.7 units/ml Monitor platelets by anticoagulation protocol: Yes   Plan:   -- Conitnue Heparin at 2050 u/hr and recheck 6hr HL @ 2100 - Coumadin 10mg  PO x1 tonight  - F/u on daily INR and HL - Monitor for signs of bleed  Jeronimo Norma, PharmD Clinical Pharmacist Resident Pager: [phone]  Jeronimo Norma 07/20/2013,3:04 PM

## 2013-07-20 NOTE — Progress Notes (Signed)
ANTICOAGULATION CONSULT NOTE - Follow Up Consult  Pharmacy Consult for Heparin/Warfarin Indication: PAF, ACS, and mechanical AVR  Allergies  Allergen Reactions  . Insulin Glargine Swelling    edema    Patient Measurements: Height: 6\' 2"  (188 cm) Weight: 309 lb 1.4 oz (140.2 kg) IBW/kg (Calculated) : 82.2 Heparin Dosing Weight: 114  Vital Signs: Temp: 98.8 F (37.1 C) (02/28 0330) Temp src: Oral (02/28 0330) BP: 113/78 mmHg (02/28 0400) Pulse Rate: 79 (02/28 0400)  Labs:  Recent Labs  07/19/13 0932 07/19/13 2055 07/20/13 0251 07/20/13 0551  HGB 12.7*  --  12.5* 11.8*  HCT 35.6*  --  34.4* 33.1*  PLT 157  --  161 167  LABPROT 15.7*  --   --  16.6*  INR 1.28  --   --  1.38  HEPARINUNFRC  --   --  0.13* 0.16*  CREATININE 0.97  --  1.07  --   TROPONINI <0.30 <0.30 <0.30  --     Estimated Creatinine Clearance: 125.9 ml/min (by C-G formula based on Cr of 1.07).   Medications:  Scheduled:  . aspirin  81 mg Oral Daily  . cholecalciferol  1,000 Units Oral Daily  . cloNIDine  0.1 mg Oral BID  . clopidogrel  75 mg Oral Q breakfast  . furosemide  40 mg Oral Q breakfast  . glimepiride  1 mg Oral Q breakfast  . insulin aspart  0-9 Units Subcutaneous TID WC  . losartan  100 mg Oral Daily  . metoprolol  200 mg Oral Daily  . potassium chloride SA  20 mEq Oral BID  . simvastatin  20 mg Oral q1800  . sodium chloride  3 mL Intravenous Q12H  . Warfarin - Pharmacist Dosing Inpatient   Does not apply q1800    Assessment: 48 yo [name] presented with radiating CP, SOB. Patient has h/o afib and mechanical AVR. S/p cath for BMS of RCA on 2/27. Note patient is on several blood thinning agents. Patients HL and INR subtherapeutic, renal function stable, and CBC wnl. No signs of bleeding noted  Goal of Therapy:  INR 2-3 Heparin level 0.3-0.7 units/ml Monitor platelets by anticoagulation protocol: Yes   Plan:  - Coumadin 10mg  PO x1 tonight  - Adjust Heparin to 2050 u/hr and recheck  6hr HL @ 1400 - F/u on daily INR and HL - Monitor for signs of bleed  [name] 07/20/2013,7:33 AM

## 2013-07-21 DIAGNOSIS — I5031 Acute diastolic (congestive) heart failure: Secondary | ICD-10-CM | POA: Diagnosis present

## 2013-07-21 LAB — BASIC METABOLIC PANEL
BUN: 14 mg/dL (ref 6–23)
CALCIUM: 8.8 mg/dL (ref 8.4–10.5)
CHLORIDE: 104 meq/L (ref 96–112)
CO2: 27 meq/L (ref 19–32)
Creatinine, Ser: 1.03 mg/dL (ref 0.50–1.35)
GFR calc Af Amer: >90 mL/min (ref >90)
GFR calc non Af Amer: 84 mL/min — ABNORMAL LOW (ref >90)
GLUCOSE: 138 mg/dL — AB (ref 70–99)
Potassium: 4.1 mEq/L (ref 3.7–5.3)
SODIUM: 142 meq/L (ref 137–147)

## 2013-07-21 LAB — CBC
HCT: 32.9 % — ABNORMAL LOW (ref 39.0–52.0)
Hemoglobin: 11.7 g/dL — ABNORMAL LOW (ref 13.0–17.0)
MCH: 28.3 pg (ref 26.0–34.0)
MCHC: 35.6 g/dL (ref 30.0–36.0)
MCV: 79.7 fL (ref 78.0–100.0)
Platelets: 160 10*3/uL (ref 150–400)
RBC: 4.13 MIL/uL — AB (ref 4.22–5.81)
RDW: 15.7 % — ABNORMAL HIGH (ref 11.5–15.5)
WBC: 6.5 10*3/uL (ref 4.0–10.5)

## 2013-07-21 LAB — PROTIME-INR
INR: 1.47 (ref 0.00–1.49)
Prothrombin Time: 17.4 seconds — ABNORMAL HIGH (ref 11.6–15.2)

## 2013-07-21 LAB — GLUCOSE, CAPILLARY
GLUCOSE-CAPILLARY: 142 mg/dL — AB (ref 70–99)
GLUCOSE-CAPILLARY: 136 mg/dL — AB (ref 70–99)
GLUCOSE-CAPILLARY: 165 mg/dL — AB (ref 70–99)
Glucose-Capillary: 123 mg/dL — ABNORMAL HIGH (ref 70–99)

## 2013-07-21 LAB — TSH: TSH: 11.788 u[IU]/mL — ABNORMAL HIGH (ref 0.350–4.500)

## 2013-07-21 LAB — HEPARIN LEVEL (UNFRACTIONATED)
HEPARIN UNFRACTIONATED: 0.69 [IU]/mL (ref 0.30–0.70)
HEPARIN UNFRACTIONATED: 0.62 [IU]/mL (ref 0.30–0.70)
Heparin Unfractionated: 0.22 IU/mL — ABNORMAL LOW (ref 0.30–0.70)

## 2013-07-21 LAB — T3, FREE: T3, Free: 2.3 pg/mL (ref 2.3–4.2)

## 2013-07-21 LAB — T4, FREE: FREE T4: 1.04 ng/dL (ref 0.80–1.80)

## 2013-07-21 MED ORDER — HEPARIN (PORCINE) IN NACL 100-0.45 UNIT/ML-% IJ SOLN
2350.0000 [IU]/h | INTRAMUSCULAR | Status: DC
  Administered 2013-07-21: 2350 [IU]/h via INTRAVENOUS
  Filled 2013-07-21 (×3): qty 250

## 2013-07-21 MED ORDER — GLIMEPIRIDE 1 MG PO TABS
1.0000 mg | ORAL_TABLET | Freq: Every day | ORAL | Status: DC
  Administered 2013-07-23 – 2013-07-25 (×3): 1 mg via ORAL
  Filled 2013-07-21 (×5): qty 1

## 2013-07-21 MED ORDER — FUROSEMIDE 10 MG/ML IJ SOLN
80.0000 mg | Freq: Four times a day (QID) | INTRAMUSCULAR | Status: AC
  Administered 2013-07-21 (×3): 80 mg via INTRAVENOUS
  Filled 2013-07-21 (×3): qty 8

## 2013-07-21 MED ORDER — SODIUM CHLORIDE 0.9 % IV SOLN
INTRAVENOUS | Status: DC
Start: 2013-07-21 — End: 2013-07-22
  Administered 2013-07-22: 15:00:00 via INTRAVENOUS

## 2013-07-21 MED ORDER — HEPARIN (PORCINE) IN NACL 100-0.45 UNIT/ML-% IJ SOLN
2000.0000 [IU]/h | INTRAMUSCULAR | Status: DC
  Administered 2013-07-21: 2250 [IU]/h via INTRAVENOUS
  Administered 2013-07-22 – 2013-07-23 (×3): 2200 [IU]/h via INTRAVENOUS
  Administered 2013-07-23 (×2): 2100 [IU]/h via INTRAVENOUS
  Administered 2013-07-24: 2000 [IU]/h via INTRAVENOUS
  Filled 2013-07-21 (×8): qty 250

## 2013-07-21 MED ORDER — WARFARIN SODIUM 6 MG PO TABS
12.0000 mg | ORAL_TABLET | Freq: Once | ORAL | Status: AC
  Administered 2013-07-21: 12 mg via ORAL
  Filled 2013-07-21: qty 2

## 2013-07-21 NOTE — Progress Notes (Signed)
ANTICOAGULATION CONSULT NOTE - Follow Up Consult  Pharmacy Consult for Heparin Indication: afib, mechanical AVR  Allergies  Allergen Reactions  . Insulin Glargine Swelling    edema    Patient Measurements: Height: 6\' 2"  (188 cm) Weight: 310 lb 11.2 oz (140.933 kg) (C scale) IBW/kg (Calculated) : 82.2 Heparin Dosing Weight: ~14kg  Vital Signs: Temp: 98.7 F (37.1 C) (03/01 1940) Temp src: Oral (03/01 1940) BP: 125/77 mmHg (03/01 1940) Pulse Rate: 63 (03/01 1940)  Labs:  Recent Labs  07/19/13 0932 07/19/13 2055  07/20/13 0251 07/20/13 0551 07/20/13 0913  07/21/13 0526 07/21/13 1330 07/21/13 2048  HGB 12.7*  --   --  12.5* 11.8*  --   --  11.7*  --   --   HCT 35.6*  --   --  34.4* 33.1*  --   --  32.9*  --   --   PLT 157  --   --  161 167  --   --  160  --   --   LABPROT 15.7*  --   --   --  16.6*  --   --  17.4*  --   --   INR 1.28  --   --   --  1.38  --   --  1.47  --   --   HEPARINUNFRC  --   --   < > 0.13* 0.16*  --   < > 0.22* 0.69 0.62  CREATININE 0.97  --   --  1.07  --   --   --  1.03  --   --   TROPONINI <0.30 <0.30  --  <0.30  --  <0.30  --   --   --   --   < > = values in this interval not displayed.  Estimated Creatinine Clearance: 131.1 ml/min (by C-G formula based on Cr of 1.03).   Medications:  Heparin @ 2250 units/hr  Assessment: 47yom continues on heparin to coumadin bridge for afib and mechanical AVR. Heparin level is now therapeutic after rate decrease this morning. No bleeding reported.  Goal of Therapy:  Heparin level 0.3-0.7 units/ml Monitor platelets by anticoagulation protocol: Yes   Plan:  1) Continue heparin at 2250 units/hr 2) Follow up heparin level, CBC in AM  Deboraha Sprang 07/21/2013,9:55 PM

## 2013-07-21 NOTE — Progress Notes (Signed)
ANTICOAGULATION CONSULT NOTE - Follow Up Consult  Pharmacy Consult for Heparin/Warfarin Indication: PAF, ACS, and mechanical AVR  Allergies  Allergen Reactions  . Insulin Glargine Swelling    edema    Patient Measurements: Height: 6\' 2"  (188 cm) Weight: 310 lb 11.2 oz (140.933 kg) (C scale) IBW/kg (Calculated) : 82.2 Heparin Dosing Weight: 114  Vital Signs: Temp: 97.9 F (36.6 C) (03/01 0537) Temp src: Oral (03/01 0537) BP: 112/77 mmHg (03/01 0537) Pulse Rate: 65 (03/01 0537)  Labs:  Recent Labs  07/19/13 0932 07/19/13 2055  07/20/13 0251 07/20/13 0551 07/20/13 0913 07/20/13 1400 07/20/13 2021 07/21/13 0526  HGB 12.7*  --   --  12.5* 11.8*  --   --   --  11.7*  HCT 35.6*  --   --  34.4* 33.1*  --   --   --  32.9*  PLT 157  --   --  161 167  --   --   --  160  LABPROT 15.7*  --   --   --  16.6*  --   --   --  17.4*  INR 1.28  --   --   --  1.38  --   --   --  1.47  HEPARINUNFRC  --   --   < > 0.13* 0.16*  --  0.36 0.54 0.22*  CREATININE 0.97  --   --  1.07  --   --   --   --  1.03  TROPONINI <0.30 <0.30  --  <0.30  --  <0.30  --   --   --   < > = values in this interval not displayed.  Estimated Creatinine Clearance: 131.1 ml/min (by C-G formula based on Cr of 1.03).   Medications:  Scheduled:  . atorvastatin  40 mg Oral q1800  . cholecalciferol  1,000 Units Oral Daily  . cloNIDine  0.1 mg Oral BID  . clopidogrel  75 mg Oral Q breakfast  . furosemide  40 mg Oral BID  . glimepiride  1 mg Oral Q breakfast  . insulin aspart  0-9 Units Subcutaneous TID WC  . losartan  100 mg Oral Daily  . metoprolol  200 mg Oral Daily  . potassium chloride SA  20 mEq Oral BID  . sodium chloride  3 mL Intravenous Q12H  . Warfarin - Pharmacist Dosing Inpatient   Does not apply q1800    Assessment: 48 yo M presented with radiating CP, SOB. Patient has h/o afib and mechanical AVR. S/p cath for BMS of RCA on 2/27. Patient's HL had been therapeutic x2, however this AM the HL is  subtherapeutic at 0.22.  Spoke with RN, patient had no issues with line and heparin was not stopped last night.  INR is also subtherapeutic this AM at 1.47.  Hgb is slightly low at 11.7 and plts are wnl.  Will adjust HL and INR, and recheck levels as appropriate.  Goal of Therapy:  INR 2-3 Heparin level 0.3-0.7 units/ml Monitor platelets by anticoagulation protocol: Yes   Plan:  -Increase Heparin to 2350 u/hr and recheck 6hr HL @ 1400 -Increase Coumadin to 12 mg PO x1 tonight  - F/u on daily INR and HL - Monitor for signs of bleed  Jeronimo Norma, PharmD Clinical Pharmacist Resident Pager: [phone]  Jeronimo Norma 07/21/2013,8:04 AM

## 2013-07-21 NOTE — Progress Notes (Signed)
Patient Name: [name]      SUBJECTIVE:* Hx of Idolina Primer AVR/Conduit surgery for acute dissection admitted with new onset heart failure and exertional chest pain Cath>>ostial R disease>.stented BMS  Still co of shortness of breath with orthopnea and DOE   On heparin to bridge for anticoagulation  Still 1.47   Past Medical History  Diagnosis Date  . Diabetes mellitus   . Hypertension   . Unspecified vitamin D deficiency   . Hyperlipidemia   . H/O cardiovascular stress test     a. Myoview 2011: walked for 12min, images with EF 39%, prominent inf scar, mild reversibility. b. Cardiac CT 06/2010: no CAD, only mild plaque in prox RCA. c. EF normal by echo 2014.  [name] hematuria   . TRANSIENT ISCHEMIC ATTACK, HX OF     a. In 2004.  [name] NEPHROLITHIASIS, HX OF   . PAF (paroxysmal atrial fibrillation)     a. H/o such, with recurrence of coarse afib/flutter during ER consult 03/2012.  . Ascending aortic dissection     a. 2000 - with aortic valve involvement. S/p repair of aortic dissection with placement of mechanical AVR.  [name] Obesity   . H/O mechanical aortic valve replacement     Scheduled Meds:  Scheduled Meds: . atorvastatin  40 mg Oral q1800  . cholecalciferol  1,000 Units Oral Daily  . cloNIDine  0.1 mg Oral BID  . clopidogrel  75 mg Oral Q breakfast  . furosemide  40 mg Oral BID  . glimepiride  1 mg Oral Q breakfast  . insulin aspart  0-9 Units Subcutaneous TID WC  . losartan  100 mg Oral Daily  . metoprolol  200 mg Oral Daily  . potassium chloride SA  20 mEq Oral BID  . sodium chloride  3 mL Intravenous Q12H  . warfarin  12 mg Oral ONCE-1800  . Warfarin - Pharmacist Dosing Inpatient   Does not apply q1800   Continuous Infusions: . sodium chloride 10 mL/hr (07/19/13 2121)  . heparin 2,350 Units/hr (07/21/13 1011)    PHYSICAL EXAM Filed Vitals:   07/20/13 2011 07/21/13 0527 07/21/13 0537 07/21/13 1014  BP: 120/67  112/77 146/93  Pulse: 66  65 66    Temp: 98.5 F (36.9 C)  97.9 F (36.6 C)   TempSrc: Oral  Oral   Resp: 18  18   Height:      Weight:  310 lb 11.2 oz (140.933 kg)    SpO2: 100%  100%     General appearance: alert and no distress Neck: JVD - 9 cm above sternal notch, n Lungs: clear to auscultation bilaterally Heart: regular rate and rhythm and mechanical S2 Extremities: edema trace Skin: Skin color, texture, turgor normal. No rashes or lesions Neurologic: Alert and oriented X 3, normal strength and tone. Normal symmetric reflexes. Normal coordination and gait    TELEMETRY: Reviewed telemetry pt in nsr   Intake/Output Summary (Last 24 hours) at 07/21/13 1144 Last data filed at 07/21/13 0525  Gross per 24 hour  Intake 627.54 ml  Output   1000 ml  Net -372.46 ml    LABS: Basic Metabolic Panel:   Recent Labs Lab 07/19/13 0932 07/20/13 0251 07/21/13 0526  NA 141 142 142  K 4.0 4.1 4.1  CL 105 102 104  CO2 26 29 27   GLUCOSE 123* 125* 138*  BUN 13 12 14   CREATININE 0.97 1.07 1.03  CALCIUM 9.0 9.0 8.8   Cardiac  Enzymes:  Recent Labs  07/19/13 2055 07/20/13 0251 07/20/13 0913  TROPONINI <0.30 <0.30 <0.30   CBC:  Recent Labs Lab 07/19/13 0932 07/20/13 0251 07/20/13 0551 07/21/13 0526  WBC 5.9 8.1 8.7 6.5  NEUTROABS 3.7  --   --   --   HGB 12.7* 12.5* 11.8* 11.7*  HCT 35.6* 34.4* 33.1* 32.9*  MCV 80.2 79.8 79.8 79.7  PLT 157 161 167 160   PROTIME:  Recent Labs  07/19/13 0932 07/20/13 0551 07/21/13 0526  LABPROT 15.7* 16.6* 17.4*  INR 1.28 1.38 1.47   Liver Function Tests:  Recent Labs  07/19/13 0932 07/19/13 2055  AST 25 26  ALT 18 20  ALKPHOS 53 60  BILITOT 1.0 1.5*  PROT 7.7 8.2  ALBUMIN 3.5 3.7   No results found for this basename: LIPASE, AMYLASE,  in the last 72 hours BNP: BNP (last 3 results)  Recent Labs  07/19/13 0932  PROBNP 912.2*   D-Dimer: No results found for this basename: DDIMER,  in the last 72 hours Hemoglobin A1C: No results found for  this basename: HGBA1C,  in the last 72 hours Fasting Lipid Panel:  Recent Labs  07/20/13 0551  CHOL 134  HDL 36*  LDLCALC 83  TRIG 76  CHOLHDL 3.7   Thyroid Function Tests:  Recent Labs  07/19/13 2055  TSH 11.484*      ASSESSMENT AND PLAN:  Principal Problem:   Acute diastolic heart failure Active Problems:   Intermediate coronary syndrome   DIABETES MELLITUS, TYPE II   HYPERTENSION   TRANSIENT ISCHEMIC ATTACK, HX OF   Ascending aortic dissection  in 2000 with aortic valve involvement s/p repair and mechanical AVR   PAF (paroxysmal atrial fibrillation) hypothyroid  Free t3 and t4 pending Continue heparin and lasix-push  Will ask PNishan as to what next withvalve given the change in velocity and ongoing symptoms of CHF   Signed, Virl Axe MD  07/21/2013

## 2013-07-21 NOTE — Progress Notes (Signed)
ANTICOAGULATION CONSULT NOTE - Follow Up Consult  Pharmacy Consult for Heparin Indication: PAF, ACS, and mechanical AVR  Allergies  Allergen Reactions  . Insulin Glargine Swelling    edema    Patient Measurements: Height: 6\' 2"  (188 cm) Weight: 310 lb 11.2 oz (140.933 kg) (C scale) IBW/kg (Calculated) : 82.2 Heparin Dosing Weight: 114  Vital Signs: Temp: 97.9 F (36.6 C) (03/01 0537) Temp src: Oral (03/01 0537) BP: 146/93 mmHg (03/01 1014) Pulse Rate: 66 (03/01 1014)  Labs:  Recent Labs  07/19/13 0932 07/19/13 2055  07/20/13 0251 07/20/13 0551 07/20/13 0913  07/20/13 2021 07/21/13 0526 07/21/13 1330  HGB 12.7*  --   --  12.5* 11.8*  --   --   --  11.7*  --   HCT 35.6*  --   --  34.4* 33.1*  --   --   --  32.9*  --   PLT 157  --   --  161 167  --   --   --  160  --   LABPROT 15.7*  --   --   --  16.6*  --   --   --  17.4*  --   INR 1.28  --   --   --  1.38  --   --   --  1.47  --   HEPARINUNFRC  --   --   < > 0.13* 0.16*  --   < > 0.54 0.22* 0.69  CREATININE 0.97  --   --  1.07  --   --   --   --  1.03  --   TROPONINI <0.30 <0.30  --  <0.30  --  <0.30  --   --   --   --   < > = values in this interval not displayed.  Estimated Creatinine Clearance: 131.1 ml/min (by C-G formula based on Cr of 1.03).   Medications:  Scheduled:  . atorvastatin  40 mg Oral q1800  . cholecalciferol  1,000 Units Oral Daily  . cloNIDine  0.1 mg Oral BID  . clopidogrel  75 mg Oral Q breakfast  . furosemide  80 mg Intravenous Q6H  . [START ON 07/22/2013] glimepiride  1 mg Oral Q breakfast  . insulin aspart  0-9 Units Subcutaneous TID WC  . losartan  100 mg Oral Daily  . metoprolol  200 mg Oral Daily  . potassium chloride SA  20 mEq Oral BID  . sodium chloride  3 mL Intravenous Q12H  . warfarin  12 mg Oral ONCE-1800  . Warfarin - Pharmacist Dosing Inpatient   Does not apply q1800    Assessment: 48 yo M presented with radiating CP, SOB. Patient has h/o afib and mechanical AVR. S/p  cath for BMS of RCA on 2/27. Patient's HL had been therapeutic x2, however this AM the HL is subtherapeutic at 0.22.  Adjusted HL and on repeat HL is 0.69, on the higher end of therapeutic.  Will slightly decrease dose and recheck in 6 hrs.  Goal of Therapy:  INR 2-3 Heparin level 0.3-0.7 units/ml Monitor platelets by anticoagulation protocol: Yes   Plan:  -Decrease Heparin to 2250 units/hr and recheck 6hr HL @ 2100 -Daily HL and cbc - Monitor for signs of bleed  Jeronimo Norma, PharmD Clinical Pharmacist Resident Pager: [phone]  Jeronimo Norma 07/21/2013,3:05 PM

## 2013-07-21 NOTE — Progress Notes (Signed)
Patient c/o of feeling SOB. O2 sat = 97% on RA. 2L O2 applied via [place] for comfort. RN will continue to monitor. Shellee Milo, RN

## 2013-07-22 ENCOUNTER — Encounter (HOSPITAL_COMMUNITY): Payer: Self-pay | Admitting: *Deleted

## 2013-07-22 ENCOUNTER — Encounter (HOSPITAL_COMMUNITY)
Admission: EM | Disposition: A | Discharge status: Home / Self Care | Payer: Self-pay | Source: Home / Self Care | Attending: Cardiology

## 2013-07-22 DIAGNOSIS — I359 Nonrheumatic aortic valve disorder, unspecified: Secondary | ICD-10-CM

## 2013-07-22 HISTORY — PX: TEE WITHOUT CARDIOVERSION: SHX5443

## 2013-07-22 LAB — HEPARIN LEVEL (UNFRACTIONATED): Heparin Unfractionated: 0.66 IU/mL (ref 0.30–0.70)

## 2013-07-22 LAB — CBC
HCT: 33.2 % — ABNORMAL LOW (ref 39.0–52.0)
Hemoglobin: 11.9 g/dL — ABNORMAL LOW (ref 13.0–17.0)
MCH: 28.4 pg (ref 26.0–34.0)
MCHC: 35.8 g/dL (ref 30.0–36.0)
MCV: 79.2 fL (ref 78.0–100.0)
Platelets: 148 10*3/uL — ABNORMAL LOW (ref 150–400)
RBC: 4.19 MIL/uL — AB (ref 4.22–5.81)
RDW: 15.7 % — ABNORMAL HIGH (ref 11.5–15.5)
WBC: 7.4 10*3/uL (ref 4.0–10.5)

## 2013-07-22 LAB — GLUCOSE, CAPILLARY
Glucose-Capillary: 129 mg/dL — ABNORMAL HIGH (ref 70–99)
Glucose-Capillary: 183 mg/dL — ABNORMAL HIGH (ref 70–99)
Glucose-Capillary: 76 mg/dL (ref 70–99)
Glucose-Capillary: 142 mg/dL — ABNORMAL HIGH (ref 70–99)

## 2013-07-22 LAB — PROTIME-INR
INR: 1.62 — AB (ref 0.00–1.49)
Prothrombin Time: 18.8 seconds — ABNORMAL HIGH (ref 11.6–15.2)

## 2013-07-22 SURGERY — ECHOCARDIOGRAM, TRANSESOPHAGEAL
Anesthesia: Moderate Sedation

## 2013-07-22 MED ORDER — BUTAMBEN-TETRACAINE-BENZOCAINE 2-2-14 % EX AERO
INHALATION_SPRAY | CUTANEOUS | Status: DC | PRN
  Administered 2013-07-22: 2 via TOPICAL

## 2013-07-22 MED ORDER — MIDAZOLAM HCL 5 MG/ML IJ SOLN
INTRAMUSCULAR | Status: AC
  Filled 2013-07-22: qty 2

## 2013-07-22 MED ORDER — FENTANYL CITRATE 0.05 MG/ML IJ SOLN
INTRAMUSCULAR | Status: DC | PRN
  Administered 2013-07-22 (×2): 25 ug via INTRAVENOUS

## 2013-07-22 MED ORDER — WARFARIN SODIUM 6 MG PO TABS
12.0000 mg | ORAL_TABLET | Freq: Once | ORAL | Status: AC
  Administered 2013-07-22: 12 mg via ORAL
  Filled 2013-07-22: qty 2

## 2013-07-22 MED ORDER — MIDAZOLAM HCL 10 MG/2ML IJ SOLN
INTRAMUSCULAR | Status: DC | PRN
  Administered 2013-07-22 (×2): 2 mg via INTRAVENOUS

## 2013-07-22 MED ORDER — FENTANYL CITRATE 0.05 MG/ML IJ SOLN
INTRAMUSCULAR | Status: AC
  Filled 2013-07-22: qty 2

## 2013-07-22 MED FILL — Sodium Chloride IV Soln 0.9%: INTRAVENOUS | Qty: 50 | Status: AC

## 2013-07-22 NOTE — Progress Notes (Signed)
Instructed by Dr. Johnsie Cancel this am that pt can have regular breakfast this am and NPO after.  Notified Pat, RN in Endo when she called for update status.  Will continue to monitor.  Karie Kirks, Therapist, sports.

## 2013-07-22 NOTE — Interval H&P Note (Signed)
History and Physical Interval Note:  07/22/2013 2:34 PM  [name]  has presented today for surgery, with the diagnosis of abnormal valve  The various methods of treatment have been discussed with the patient and family. After consideration of risks, benefits and other options for treatment, the patient has consented to  Procedure(s): TRANSESOPHAGEAL ECHOCARDIOGRAM (TEE) (N/A) as a surgical intervention .  The patient's history has been reviewed, patient examined, no change in status, stable for surgery.  I have reviewed the patient's chart and labs.  Questions were answered to the patient's satisfaction.     Jenkins Rouge

## 2013-07-22 NOTE — Op Note (Signed)
INDICATIONS: mechanical prosthesis obstruction  PROCEDURE:   Informed consent was obtained prior to the procedure. The risks, benefits and alternatives for the procedure were discussed and the patient comprehended these risks.  Risks include, but are not limited to, cough, sore throat, vomiting, nausea, somnolence, esophageal and stomach trauma or perforation, bleeding, low blood pressure, aspiration, pneumonia, infection, trauma to the teeth and death.    After a procedural time-out, the oropharynx was anesthetized with 20% benzocaine spray. The patient was given 4 mg versed and 50 mcg fentanyl for moderate sedation.   The transesophageal probe was inserted in the esophagus and stomach without difficulty and multiple views were obtained.  The patient was kept under observation until the patient left the procedure room.  The patient left the procedure room in stable condition.   Agitated microbubble saline contrast was not administered.  COMPLICATIONS:    There were no immediate complications.  FINDINGS:  Moderate stenosis of the aortic valve mechanical prosthesis. Disc motion appears normal, confirmed on fluoroscopy. Acoustic shadowing prevents evaluation for pannus. Mild MR Borderline LVEF 50% Pseudonormal mitral inflow - elevated filling pressures. Proximal aortic graft repair extends to mid ascending aorta, where a distinct aortic lumen and graft lumen are visualized side by side, with flow in both.  RECOMMENDATIONS:   Appears to need additional diuresis. May require redo AVR in near future.   Time Spent Directly with the Patient:  45 minutes   Arietta Eisenstein 07/22/2013, 4:34 PM

## 2013-07-22 NOTE — Progress Notes (Signed)
Echocardiogram Echocardiogram Transesophageal has been performed.  [name] 07/22/2013, 5:09 PM

## 2013-07-22 NOTE — Progress Notes (Signed)
ANTICOAGULATION CONSULT NOTE - Follow Up Consult  Pharmacy Consult for Heparin and Coumadin  Indication: afib, mechanical AVR  Allergies  Allergen Reactions  . Insulin Glargine Swelling    edema    Patient Measurements: Height: 6\' 2"  (188 cm) Weight: 305 lb (138.347 kg) (Scale C) IBW/kg (Calculated) : 82.2 Heparin Dosing Weight: ~14kg  Vital Signs: Temp: 97.9 F (36.6 C) (03/02 0448) Temp src: Oral (03/02 0448) BP: 114/70 mmHg (03/02 0911) Pulse Rate: 62 (03/02 0911)  Labs:  Recent Labs  07/19/13 2055  07/20/13 0251 07/20/13 0551 07/20/13 0913  07/21/13 0526 07/21/13 1330 07/21/13 2048 07/22/13 0355  HGB  --   < > 12.5* 11.8*  --   --  11.7*  --   --  11.9*  HCT  --   < > 34.4* 33.1*  --   --  32.9*  --   --  33.2*  PLT  --   < > 161 167  --   --  160  --   --  148*  LABPROT  --   --   --  16.6*  --   --  17.4*  --   --  18.8*  INR  --   --   --  1.38  --   --  1.47  --   --  1.62*  HEPARINUNFRC  --   < > 0.13* 0.16*  --   < > 0.22* 0.69 0.62 0.66  CREATININE  --   --  1.07  --   --   --  1.03  --   --   --   TROPONINI <0.30  --  <0.30  --  <0.30  --   --   --   --   --   < > = values in this interval not displayed.  Estimated Creatinine Clearance: 129.8 ml/min (by C-G formula based on Cr of 1.03).   Medications:  Heparin @ 2250 units/hr  Assessment: 47yom continues on heparin to coumadin bridge for afib and mechanical AVR. Heparin level remains therapeutic at 0.66 but at upper end of range. INR trending up nicely to 1.62. Hgb stable. Plt have trended down to 148. Patient states he bled from his site of lab stick last night but it has now resolved.   Goal of Therapy:  INR 2-3  Heparin level 0.3-0.7 units/ml Monitor platelets by anticoagulation protocol: Yes   Plan:  -Decrease Heparin to 2200 u/hr and recheck AM level  -Repeat Coumadin to 12 mg PO x1 tonight  - F/u on daily INR and HL - Monitor for signs of bleed   Albertina Parr, PharmD.   Clinical Pharmacist Pager [phone]

## 2013-07-22 NOTE — Progress Notes (Signed)
Pt arrived from Endo post TEE.  A&0 x4.  Call bell at reach.  Family at the bedside.  Pt refused going to bed.  Insisted on sitting in the chair.  Instructed to call for help before getting up. Verbalized understanding.  Will continue to monitor.  Karie Kirks, Therapist, sports.

## 2013-07-22 NOTE — Progress Notes (Signed)
Patient ID: [name], male   DOB: 1965-08-16, 48 y.o.   MRN: [id number]    Subjective:  Continues to be dyspnic   Objective:  Filed Vitals:   07/21/13 1014 07/21/13 1400 07/21/13 1940 07/22/13 0448  BP: 146/93 110/71 125/77 108/68  Pulse: 66 67 63 58  Temp:  97.8 F (36.6 C) 98.7 F (37.1 C) 97.9 F (36.6 C)  TempSrc:  Oral Oral Oral  Resp:  18 18 20   Height:      Weight:    305 lb (138.347 kg)  SpO2:  99% 96% 96%    Intake/Output from previous day:  Intake/Output Summary (Last 24 hours) at 07/22/13 9233 Last data filed at 07/22/13 0076  Gross per 24 hour  Intake    700 ml  Output   4450 ml  Net  -3750 ml    Physical Exam: Affect appropriate Overweight black male  HEENT: normal Neck supple with no adenopathy JVP normal no bruits no thyromegaly Lungs clear with no wheezing and good diaphragmatic motion Heart:  S1/S2click SEM   murmur, no rub, gallop or click PMI normal Abdomen: benighn, BS positve, no tenderness, no AAA no bruit.  No HSM or HJR Distal pulses intact with no bruits No edema Neuro non-focal Skin warm and dry No muscular weakness   Lab Results: Basic Metabolic Panel:  Recent Labs  07/20/13 0251 07/21/13 0526  NA 142 142  K 4.1 4.1  CL 102 104  CO2 29 27  GLUCOSE 125* 138*  BUN 12 14  CREATININE 1.07 1.03  CALCIUM 9.0 8.8   Liver Function Tests:  Recent Labs  07/19/13 0932 07/19/13 2055  AST 25 26  ALT 18 20  ALKPHOS 53 60  BILITOT 1.0 1.5*  PROT 7.7 8.2  ALBUMIN 3.5 3.7   CBC:  Recent Labs  07/19/13 0932  07/21/13 0526 07/22/13 0355  WBC 5.9  < > 6.5 7.4  NEUTROABS 3.7  --   --   --   HGB 12.7*  < > 11.7* 11.9*  HCT 35.6*  < > 32.9* 33.2*  MCV 80.2  < > 79.7 79.2  PLT 157  < > 160 148*  < > = values in this interval not displayed. Cardiac Enzymes:  Recent Labs  07/19/13 2055 07/20/13 0251 07/20/13 0913  TROPONINI <0.30 <0.30 <0.30   Fasting Lipid Panel:  Recent Labs  07/20/13 0551  CHOL 134    HDL 36*  LDLCALC 83  TRIG 76  CHOLHDL 3.7   Thyroid Function Tests:  Recent Labs  07/21/13 0526  TSH 11.788*  T3FREE 2.3    Imaging: No results found.  Cardiac Studies:  ECG:  SR poor R wave progression no acute ST/T wave changes    Telemetry:  NSR no arrhythmia   Echo: Study Conclusions  - Left ventricle: The cavity size was mildly dilated. Wall thickness was increased in a pattern of moderate LVH. Systolic function was normal. The estimated ejection fraction was in the range of 55% to 60%. Wall motion was normal; there were no regional wall motion abnormalities. - Aortic valve: A mechanical prosthesis was present. There was moderate to severe stenosis. Trivial regurgitation. - Mitral valve: Mild regurgitation. - Left atrium: The atrium was moderately dilated. - Right ventricle: The cavity size was mildly dilated. - Right atrium: The atrium was mildly to moderately dilated. - Atrial septum: The septum bowed from left to right, consistent with increased left atrial pressure. - Pulmonary arteries: Systolic pressure was moderately  increased. PA peak pressure: 69mm Hg (S). Impressions:  - Compared to study of 03/12/13, mean gradient across prosthetic aortic valve has increased from 21 to 36 mmHg; findings concerning for valve dysfunction.   Medications:   . atorvastatin  40 mg Oral q1800  . cholecalciferol  1,000 Units Oral Daily  . cloNIDine  0.1 mg Oral BID  . clopidogrel  75 mg Oral Q breakfast  . glimepiride  1 mg Oral Q breakfast  . insulin aspart  0-9 Units Subcutaneous TID WC  . losartan  100 mg Oral Daily  . metoprolol  200 mg Oral Daily  . potassium chloride SA  20 mEq Oral BID  . sodium chloride  3 mL Intravenous Q12H  . Warfarin - Pharmacist Dosing Inpatient   Does not apply q1800     . sodium chloride 10 mL/hr (07/19/13 2121)  . sodium chloride    . heparin 2,250 Units/hr (07/21/13 2356)    Assessment/Plan:  CAD:  S/P stent to  The ostium  of the RCA graft per JV plavix for a month then change back to ASA with his coumadin AVR:  By fluro appears to be a medtronic-hall single disc valve.  Review of echos as far back as 2007 show persistant Velocities by CW in 29m/sec range.  In 2012 was as high as 3.59m/sec.  Any velocity over 34m/sec is abnormal no matter What valve or size.  Valve placed in [place]  ? 2000  On fluro the opening angle appeared normal in 60 degree range And the valve closed flat with limited AR on root injection and echo.  This would argue against pannus ingrowth of sewing  Ring.  With subRx INR more likely issue would be thrombus affecting outlet strut.  Also gradients may vary by echo Depending on wether CW catching mostly the minor or major outlet opening.    Discussed TEE with patient Willing to proceed He has had one before and complained that he was too awake and probe Hurt.  Earliest could schedule is 3:300 NPO after breakfast.  Orders written and confirmed with endo  Jenkins Rouge 07/22/2013, 8:08 AM

## 2013-07-23 ENCOUNTER — Encounter (HOSPITAL_COMMUNITY): Payer: Self-pay | Admitting: Cardiovascular Disease

## 2013-07-23 DIAGNOSIS — I5031 Acute diastolic (congestive) heart failure: Secondary | ICD-10-CM

## 2013-07-23 DIAGNOSIS — R0609 Other forms of dyspnea: Secondary | ICD-10-CM

## 2013-07-23 DIAGNOSIS — R0989 Other specified symptoms and signs involving the circulatory and respiratory systems: Secondary | ICD-10-CM

## 2013-07-23 LAB — CBC
HCT: 31.1 % — ABNORMAL LOW (ref 39.0–52.0)
HEMATOCRIT: 32.2 % — AB (ref 39.0–52.0)
Hemoglobin: 11 g/dL — ABNORMAL LOW (ref 13.0–17.0)
Hemoglobin: 11.7 g/dL — ABNORMAL LOW (ref 13.0–17.0)
MCH: 28.3 pg (ref 26.0–34.0)
MCH: 29 pg (ref 26.0–34.0)
MCHC: 35.4 g/dL (ref 30.0–36.0)
MCHC: 36.3 g/dL — ABNORMAL HIGH (ref 30.0–36.0)
MCV: 79.9 fL (ref 78.0–100.0)
MCV: 79.9 fL (ref 78.0–100.0)
PLATELETS: 168 10*3/uL (ref 150–400)
Platelets: 161 10*3/uL (ref 150–400)
RBC: 3.89 MIL/uL — ABNORMAL LOW (ref 4.22–5.81)
RBC: 4.03 MIL/uL — ABNORMAL LOW (ref 4.22–5.81)
RDW: 15.6 % — ABNORMAL HIGH (ref 11.5–15.5)
RDW: 15.4 % (ref 11.5–15.5)
WBC: 6 10*3/uL (ref 4.0–10.5)
WBC: 6.5 10*3/uL (ref 4.0–10.5)

## 2013-07-23 LAB — GLUCOSE, CAPILLARY
GLUCOSE-CAPILLARY: 133 mg/dL — AB (ref 70–99)
GLUCOSE-CAPILLARY: 402 mg/dL — AB (ref 70–99)
GLUCOSE-CAPILLARY: 96 mg/dL (ref 70–99)
Glucose-Capillary: 139 mg/dL — ABNORMAL HIGH (ref 70–99)
Glucose-Capillary: 130 mg/dL — ABNORMAL HIGH (ref 70–99)

## 2013-07-23 LAB — HEPARIN LEVEL (UNFRACTIONATED)
HEPARIN UNFRACTIONATED: 0.72 [IU]/mL — AB (ref 0.30–0.70)
HEPARIN UNFRACTIONATED: 0.71 [IU]/mL — AB (ref 0.30–0.70)

## 2013-07-23 LAB — PROTIME-INR
INR: 1.94 — ABNORMAL HIGH (ref 0.00–1.49)
Prothrombin Time: 21.6 seconds — ABNORMAL HIGH (ref 11.6–15.2)

## 2013-07-23 MED ORDER — FUROSEMIDE 10 MG/ML IJ SOLN: 80.0000 mg | Freq: Two times a day (BID) | INTRAMUSCULAR | Status: DC

## 2013-07-23 MED ORDER — FUROSEMIDE 10 MG/ML IJ SOLN
40.0000 mg | Freq: Two times a day (BID) | INTRAMUSCULAR | Status: DC
  Administered 2013-07-23 (×2): 40 mg via INTRAVENOUS
  Filled 2013-07-23 (×4): qty 4

## 2013-07-23 MED ORDER — POLYETHYLENE GLYCOL 3350 17 G PO PACK
17.0000 g | PACK | Freq: Every day | ORAL | Status: DC
  Administered 2013-07-23: 17 g via ORAL
  Filled 2013-07-23 (×3): qty 1

## 2013-07-23 MED ORDER — WARFARIN SODIUM 6 MG PO TABS
12.0000 mg | ORAL_TABLET | Freq: Once | ORAL | Status: AC
  Administered 2013-07-23: 12 mg via ORAL
  Filled 2013-07-23: qty 2

## 2013-07-23 NOTE — Progress Notes (Addendum)
Cardiologist - Dr. Stanford Breed  Subjective:  Noted scrotal edema today.  TEE reviewed. Mild SOB with ambulation.   Objective:  Vital Signs in the last 24 hours: Temp:  [97.6 F (36.4 C)-98.2 F (36.8 C)] 98.2 F (36.8 C) (03/03 0559) Pulse Rate:  [55-66] 59 (03/03 1002) Resp:  [16-25] 16 (03/03 0559) BP: (93-145)/(50-97) 102/73 mmHg (03/03 1002) SpO2:  [96 %-100 %] 96 % (03/03 0559) Weight:  [307 lb 15.7 oz (139.7 kg)] 307 lb 15.7 oz (139.7 kg) (03/03 0559)  Intake/Output from previous day: 03/02 0701 - 03/03 0700 In: 1851.8 [P.O.:480; I.V.:1371.8] Out: 1325 [Urine:1325]   Physical Exam: General: Well developed, well nourished, in no acute distress. Head:  Normocephalic and atraumatic. Mild JVD Lungs: Clear to auscultation and percussion. Heart: Normal S1 and click S2.  No murmur, rubs or gallops.  Abdomen: soft, non-tender, positive bowel sounds. obese Extremities: No clubbing or cyanosis. trace edema. Neurologic: Alert and oriented x 3.    Lab Results:  Recent Labs  07/22/13 0355 07/23/13 0256  WBC 7.4 6.0  HGB 11.9* 11.0*  PLT 148* 168    Recent Labs  07/21/13 0526  NA 142  K 4.1  CL 104  CO2 27  GLUCOSE 138*  BUN 14  CREATININE 1.03   Telemetry: NSR Personally viewed.   EKG:  NSR NSSTW changes  Cardiac Studies:  TEE: 07/22/13 - Left ventricle: Systolic function was normal. The estimated ejection fraction was in the range of 50% to 55%. Diffuse hypokinesis. - Aortic valve: A Medtronic-Hall mechanical prosthesis was present. The prosthesis disk had a normal range of motion. The sewing ring appeared normal. There was moderate to severe stenosis. Acoustic shadowing precludes estimation of pannus formation. Valve area: 0.96cm^2(VTI). Valve area: 0.96cm^2 (Vmax). Gradients are likely uderestimated due to limited ability for beam alignment from the esophagus. Higher gradients were recorded by transthoracic imaging. - Aorta: Segment of aortic root  graft replacement ([place]). There is a short segment of dissection in the ascending aorta, beyond the proximal graft anastomosis, ending before the arch. There is brisk flow in both the true and the false lumen. The overall aortic diameter is approximately 4 cm in this segment. - Mitral valve: No evidence of vegetation. Mild regurgitation. - Left atrium: The atrium was moderately dilated. No evidence of thrombus in the atrial cavity or appendage. No spontaneous echo contrast was observed. - Right ventricle: The cavity size was mildly dilated. Wall thickness was normal. - Right atrium: The atrium was dilated. - Atrial septum: No defect or patent foramen ovale was identified. Echo contrast study showed no right-to-left atrial level shunt, following an increase in RA pressure induced by provocative maneuvers. - Tricuspid valve: No evidence of vegetation. Moderate regurgitation directed centrally. - Pulmonic valve: No evidence of vegetation.  [name] atorvastatin  40 mg Oral q1800  . cholecalciferol  1,000 Units Oral Daily  . cloNIDine  0.1 mg Oral BID  . clopidogrel  75 mg Oral Q breakfast  . furosemide  40 mg Intravenous BID  . glimepiride  1 mg Oral Q breakfast  . insulin aspart  0-9 Units Subcutaneous TID WC  . losartan  100 mg Oral Daily  . metoprolol  200 mg Oral Daily  . potassium chloride SA  20 mEq Oral BID  . sodium chloride  3 mL Intravenous Q12H  . Warfarin - Pharmacist Dosing Inpatient   Does not apply q1800   Assessment/Plan:  Principal Problem:   Acute diastolic heart failure Active Problems:  DIABETES MELLITUS, TYPE II   HYPERTENSION   TRANSIENT ISCHEMIC ATTACK, HX OF   Ascending aortic dissection  in 2000 with aortic valve involvement s/p repair and mechanical AVR   PAF (paroxysmal atrial fibrillation)   Intermediate coronary syndrome   48 year old male with Medtronic Hall single disk mechanical valve (placed in ?2000) with elevated transvalvular velocity  progressively worsening with CAD post CABG, PCI of SVG to RCA on 07/19/13 ([facility]) with acute diastolic heart failure.   1) CAD  - stable post PCI to SVG RCA graft.  - Continue triple therapy, Plavix, ASA, Coumadin for 1 month. Will likely stop Plavix after one month ([facility]) 08/16/13.   2) Aortic valve disorder  - Mechanical  - Concerning increased velocity/gradient  - He understands that repeat AVR is likely in the future.  - Dyspnea may be in part from stenosis.  - Reviewed Dr. Johnsie Cancel, [place] notes.   - Opens well on flouro.  - No suggestion of thrombus.   - I would like him to see TCTS for review/ consultation as outpatient.   3) Acute diastolic HF  - Will give lasix 40IV BID today. (40mg  PO at home)  - Scrotal edema  - Hopefully will help with dyspnea in part (note aortic stenosis)  - Watch BP. If needed, cut back on other antihypertensives.   - BMET in am.   4) HL  - atorvastatin  5) Aortic root replacement (Bentall)  - Dual lumens seen on TEE    SKAINS, MARK 07/23/2013, 11:25 AM

## 2013-07-23 NOTE — Progress Notes (Signed)
ANTICOAGULATION CONSULT NOTE - Follow Up Consult  Pharmacy Consult for heparin Indication: atrial fibrillation and AVR  Labs:  Recent Labs  07/20/13 0913  07/21/13 0526  07/21/13 2048 07/22/13 0355 07/23/13 0256  HGB  --   --  11.7*  --   --  11.9* 11.0*  HCT  --   --  32.9*  --   --  33.2* 31.1*  PLT  --   --  160  --   --  148* 168  LABPROT  --   --  17.4*  --   --  18.8* 21.6*  INR  --   --  1.47  --   --  1.62* 1.94*  HEPARINUNFRC  --   < > 0.22*  < > 0.62 0.66 0.72*  CREATININE  --   --  1.03  --   --   --   --   TROPONINI <0.30  --   --   --   --   --   --   < > = values in this interval not displayed.   Assessment: 48yo male now slightly supratherapeutic on heparin despite rate decrease.  Goal of Therapy:  Heparin level 0.3-0.7 units/ml   Plan:  Will decrease heparin gtt by ~1 unit/kg/hr to 2100 units/hr and check level in 6hr.  Wynona Neat, PharmD, BCPS  07/23/2013,5:19 AM

## 2013-07-23 NOTE — Progress Notes (Signed)
Pt A/O x4, educated to stay on bedrest until MD clears to get up d/t hematoma. Pt states he cannot urinate laying down. Pt educated on importance of remaining in bed. Will continue to monitor groin site. Ronnette Hila, RN

## 2013-07-23 NOTE — Progress Notes (Signed)
Report given to receiving RN. Patient is up and ambulating to rest room with family at bedside. No verbal complaints and no signs or symptoms of distress or discomfort.

## 2013-07-23 NOTE — Progress Notes (Addendum)
Pt groin site assessed and bruising noted at shift change, Pt notified RN that bleeding noted on groin, upon assessment, palpable hematoma noted to groin area, minimal amount of blood on bandaid noted from puncture site. Patient states his groin has been sore and that he noted scrotal swelling this AM. Pt BP 98/63. HR in 60s. Pt also states he has noted a hard spot and swelling to right groin today. No bruising noted to scrotal area. Dr. Colon Flattery notified and ordered that RN mark hematoma and assess groin for spreading of hematoma. New order for CBC. Ordered to continue Heparin infusion d/t pt mechanical valve. Also ordered to hold PM dose of clonidine until pt BP >212 systolic. Pt notified and educated to stay in bed at this time and to notify RN if groin begins to bleed, swell, or if change in sensation/pain. Will continue to monitor. Ronnette Hila, RN

## 2013-07-23 NOTE — Progress Notes (Signed)
ANTICOAGULATION CONSULT NOTE - Follow Up Consult  Pharmacy Consult for Heparin and Coumadin  Indication: afib, mechanical AVR  Allergies  Allergen Reactions  . Insulin Glargine Swelling    edema    Patient Measurements: Height: 6\' 2"  (188 cm) Weight: 307 lb 15.7 oz (139.7 kg) IBW/kg (Calculated) : 82.2 Heparin Dosing Weight:   Vital Signs: Temp: 98.2 F (36.8 C) (03/03 0559) Temp src: Oral (03/03 0559) BP: 106/59 mmHg (03/03 1212) Pulse Rate: 61 (03/03 1212)  Labs:  Recent Labs  07/21/13 0526  07/22/13 0355 07/23/13 0256 07/23/13 1125  HGB 11.7*  --  11.9* 11.0*  --   HCT 32.9*  --  33.2* 31.1*  --   PLT 160  --  148* 168  --   LABPROT 17.4*  --  18.8* 21.6*  --   INR 1.47  --  1.62* 1.94*  --   HEPARINUNFRC 0.22*  < > 0.66 0.72* 0.71*  CREATININE 1.03  --   --   --   --   < > = values in this interval not displayed.  Estimated Creatinine Clearance: 130.5 ml/min (by C-G formula based on Cr of 1.03).   Medications:  Heparin @ 21000 units/hr  Assessment: 47yom continues on heparin to coumadin bridge for afib and mechanical AVR. Heparin level remains supra-therapeutic at 0.71. INR trending up nicely to 1.94. Expect INR to be > 2 in AM. Hgb stable. Plt have trended up to 168.  Patient reports no s/s of bleeding.   Goal of Therapy:  INR 2-3  Heparin level 0.3-0.7 units/ml Monitor platelets by anticoagulation protocol: Yes   Plan:  -Decrease Heparin to 2000 u/hr and recheck 6-hr heparin level  -Repeat Coumadin to 12 mg PO x1 tonight  - F/u on daily INR and HL - Monitor for signs of bleed - Stop heparin tomorrow if INR is > 2    Albertina Parr, PharmD.  Clinical Pharmacist Pager [phone]

## 2013-07-23 NOTE — Progress Notes (Signed)
Utilization Review Completed [facility] [place] J. Mikinzie Maciejewski, RN, BSN, NCM [phone]  

## 2013-07-23 NOTE — Care Management Note (Signed)
    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   (If response is "NO", the following Medicare IM given date fields will be blank) Date Medicare IM given:   Date Additional Medicare IM given:    Discharge Disposition:    Per UR Regulation:    If discussed at Long Length of Stay Meetings, dates discussed:    Comments:

## 2013-07-24 ENCOUNTER — Encounter (HOSPITAL_COMMUNITY): Payer: Self-pay | Admitting: *Deleted

## 2013-07-24 ENCOUNTER — Inpatient Hospital Stay (HOSPITAL_COMMUNITY): Payer: Medicare Other

## 2013-07-24 DIAGNOSIS — I25810 Atherosclerosis of coronary artery bypass graft(s) without angina pectoris: Secondary | ICD-10-CM | POA: Diagnosis present

## 2013-07-24 DIAGNOSIS — I350 Nonrheumatic aortic (valve) stenosis: Secondary | ICD-10-CM | POA: Diagnosis present

## 2013-07-24 DIAGNOSIS — R58 Hemorrhage, not elsewhere classified: Secondary | ICD-10-CM | POA: Insufficient documentation

## 2013-07-24 DIAGNOSIS — S301XXA Contusion of abdominal wall, initial encounter: Secondary | ICD-10-CM | POA: Diagnosis not present

## 2013-07-24 DIAGNOSIS — K683 Retroperitoneal hematoma: Secondary | ICD-10-CM | POA: Insufficient documentation

## 2013-07-24 LAB — HEPARIN LEVEL (UNFRACTIONATED)
Heparin Unfractionated: 0.51 IU/mL (ref 0.30–0.70)
Heparin Unfractionated: <0.1 IU/mL — ABNORMAL LOW (ref 0.30–0.70)

## 2013-07-24 LAB — CBC
HCT: 31.9 % — ABNORMAL LOW (ref 39.0–52.0)
HEMOGLOBIN: 11.4 g/dL — AB (ref 13.0–17.0)
MCH: 28.4 pg (ref 26.0–34.0)
MCHC: 35.7 g/dL (ref 30.0–36.0)
MCV: 79.4 fL (ref 78.0–100.0)
PLATELETS: 155 10*3/uL (ref 150–400)
RBC: 4.02 MIL/uL — ABNORMAL LOW (ref 4.22–5.81)
RDW: 15.5 % (ref 11.5–15.5)
WBC: 5.6 10*3/uL (ref 4.0–10.5)

## 2013-07-24 LAB — PROTIME-INR
INR: 2.28 — AB (ref 0.00–1.49)
PROTHROMBIN TIME: 24.4 s — AB (ref 11.6–15.2)

## 2013-07-24 LAB — BASIC METABOLIC PANEL
BUN: 23 mg/dL (ref 6–23)
CO2: 26 mEq/L (ref 19–32)
Calcium: 8.8 mg/dL (ref 8.4–10.5)
Chloride: 104 mEq/L (ref 96–112)
Creatinine, Ser: 1.08 mg/dL (ref 0.50–1.35)
GFR calc Af Amer: >90 mL/min (ref >90)
GFR, EST NON AFRICAN AMERICAN: 79 mL/min — AB (ref >90)
Glucose, Bld: 134 mg/dL — ABNORMAL HIGH (ref 70–99)
POTASSIUM: 4.2 meq/L (ref 3.7–5.3)
SODIUM: 142 meq/L (ref 137–147)

## 2013-07-24 LAB — GLUCOSE, CAPILLARY
Glucose-Capillary: 133 mg/dL — ABNORMAL HIGH (ref 70–99)
Glucose-Capillary: 128 mg/dL — ABNORMAL HIGH (ref 70–99)
Glucose-Capillary: 130 mg/dL — ABNORMAL HIGH (ref 70–99)
Glucose-Capillary: 128 mg/dL — ABNORMAL HIGH (ref 70–99)

## 2013-07-24 IMAGING — CT CT ABD-PELV W/O CM
2 of 4 series · 17 of 46 positions shown, 19 images · non-contrast
Comparison: 08/13/2008

CLINICAL DATA: Evaluate for retroperitoneal bleeding.

EXAM:
CT ABDOMEN AND PELVIS WITHOUT CONTRAST
TECHNIQUE: Multidetector CT imaging of the abdomen and pelvis was performed
following the standard protocol without intravenous contrast.

[Series 2: routine · axial · 0.97mm/px · z∈[+69,+559]mm · 14 of 108 slices shown, 16 images]
[im 5/108  soft-tissue]
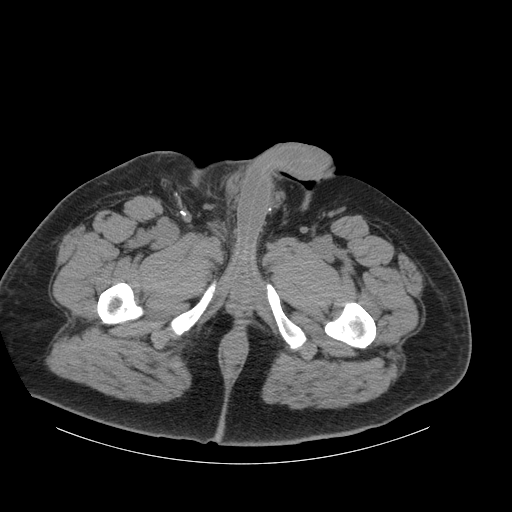
[im 5/108  bone]
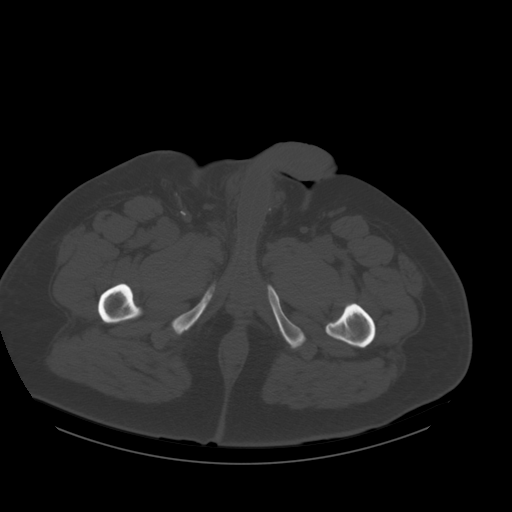
[im 14/108  soft-tissue]
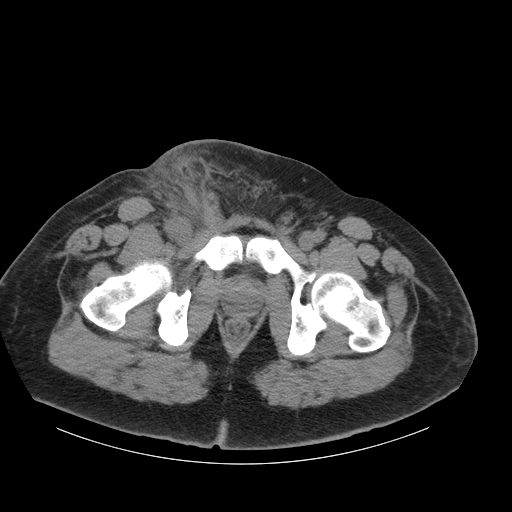
[im 23/108  soft-tissue]
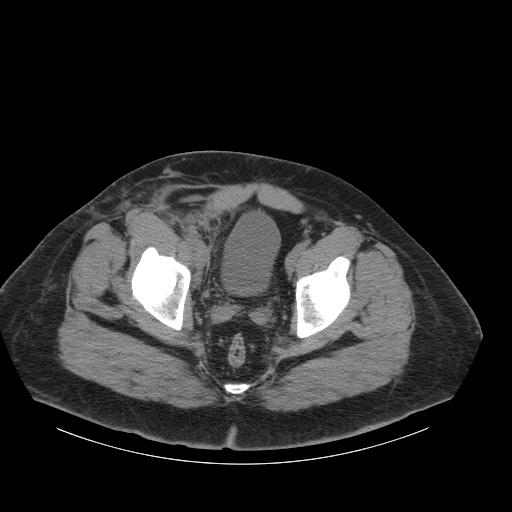
[im 27/108  soft-tissue]
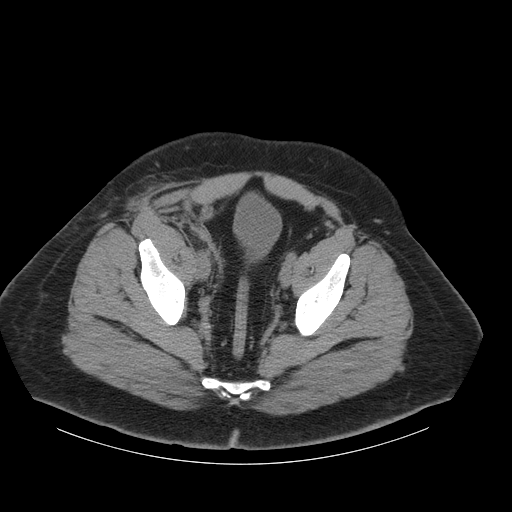
[im 36/108  soft-tissue]
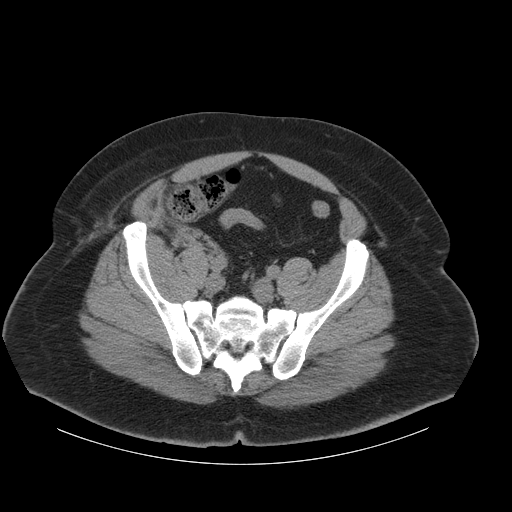
[im 45/108  soft-tissue]
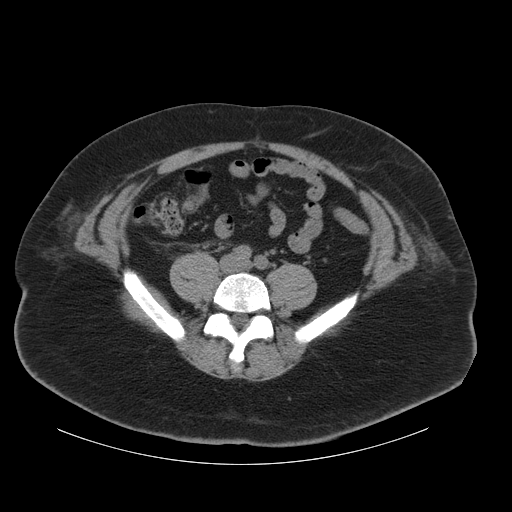
[im 50/108  soft-tissue]
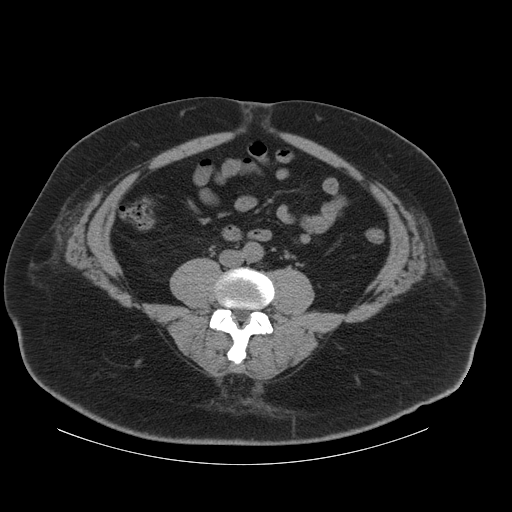
[im 58/108  soft-tissue]
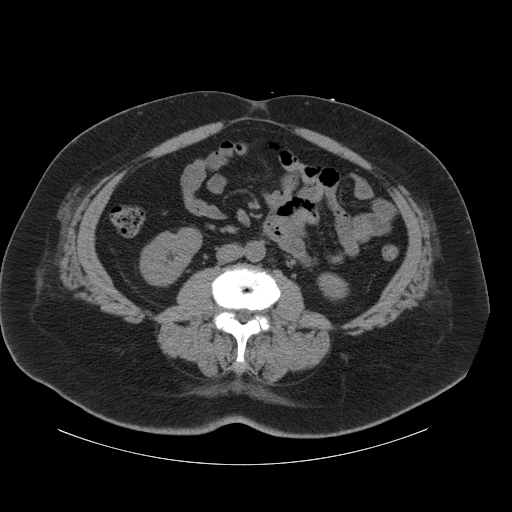
[im 63/108  soft-tissue]
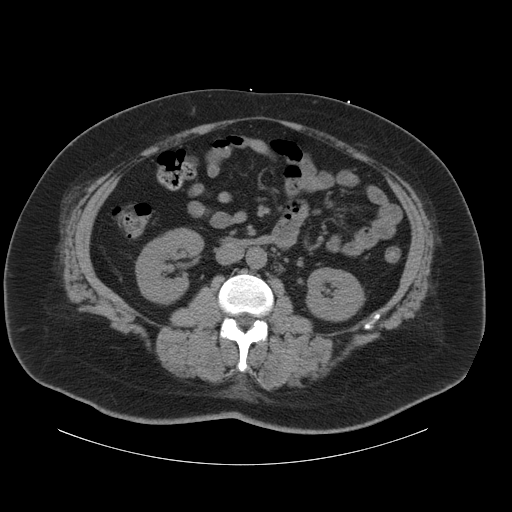
[im 63/108  bone]
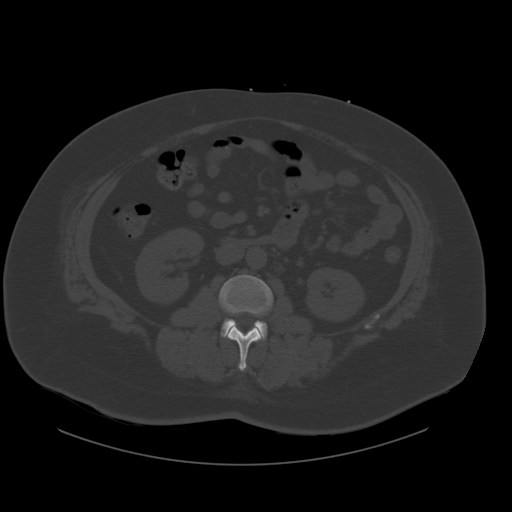
[im 72/108  soft-tissue]
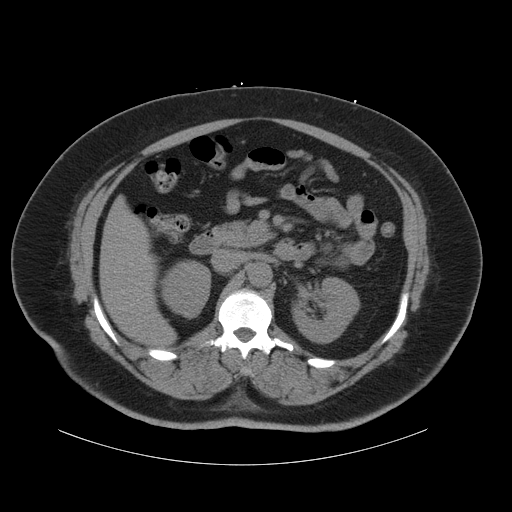
[im 81/108  soft-tissue]
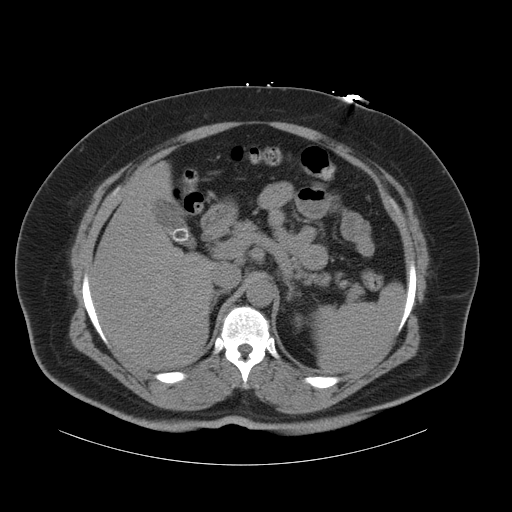
[im 85/108  soft-tissue]
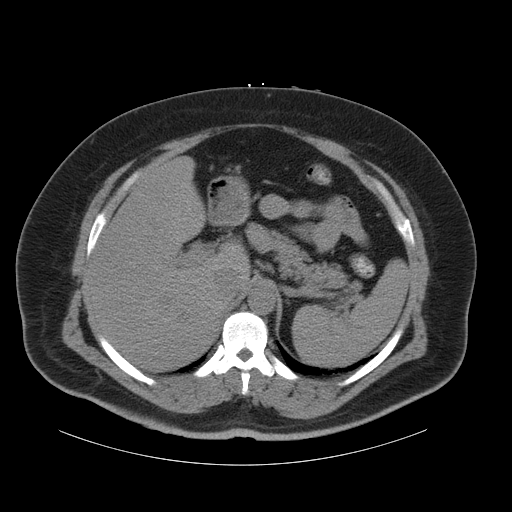
[im 94/108  soft-tissue]
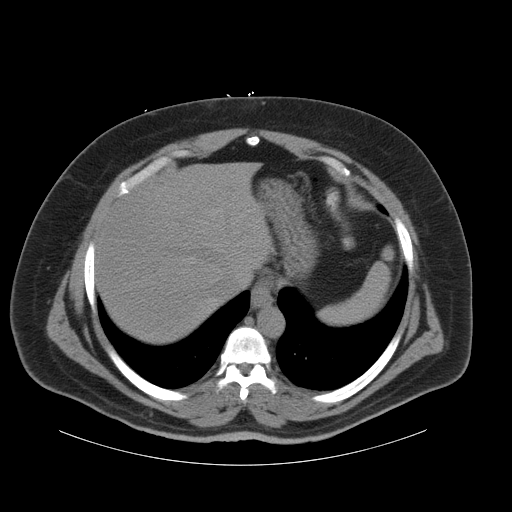
[im 103/108  soft-tissue]
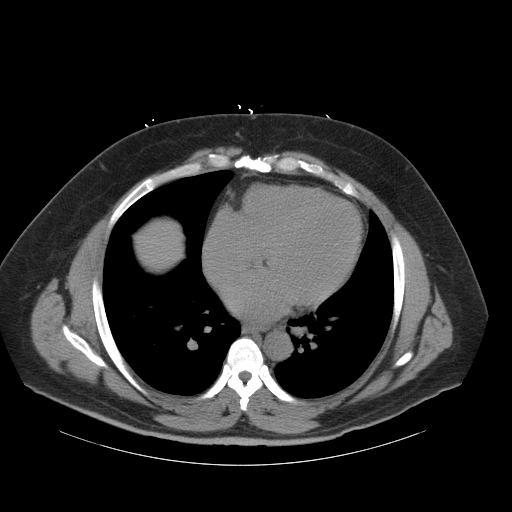

[mpr, coronals, coronal · coronal · 1.04mm/px · 3 of 130 slices shown]
[im 44/130  soft-tissue]
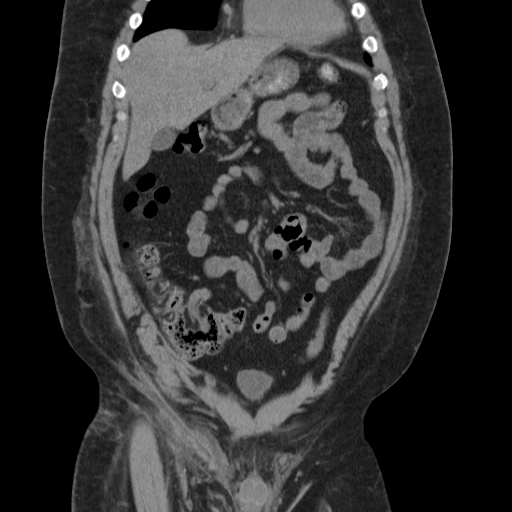
[im 58/130  soft-tissue]
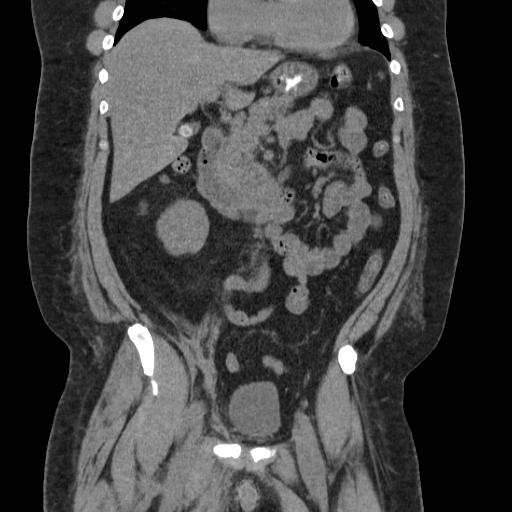
[im 72/130  soft-tissue]
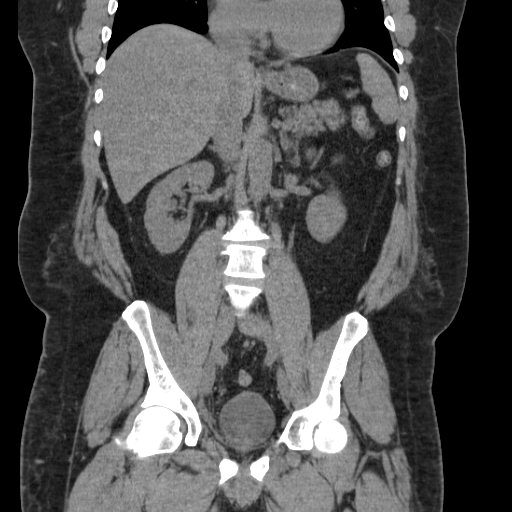

[17 of 46 positions shown; findings below may reference images not displayed]

FINDINGS: BODY WALL: There is stranding in the subcutaneous compartment of the
right groin consistent with hemorrhage. There is a discrete hematoma
which measures 5 x 3 x 4 cm. There is some extension of hemorrhage
into the extraperitoneal space of the right lower quadrant, without
measurable hematoma. No noncontrast evidence of pseudoaneurysm.

LOWER CHEST: Aortic valve replacement.  Probable CABG.

ABDOMEN/PELVIS:

Liver: No focal abnormality.

Biliary: Cholelithiasis.  No biliary obstruction.

Pancreas: Unremarkable.

Spleen: Unremarkable.

Adrenals: Unremarkable.

Kidneys and ureters: No hydronephrosis or stone.

Bladder: Unremarkable.

Reproductive: Unremarkable.

Bowel: No obstruction. Suspect appendectomy. No pericecal
inflammation.

Retroperitoneum: No mass or adenopathy.

Peritoneum: No free fluid or gas.

Vascular: No acute abnormality.

OSSEOUS: Bilateral L3 pars defects with no anterolisthesis but
focally advanced L3-4 degenerative disc disease.

These results were called by telephone at the time of interpretation
on 07/24/2013 at [DATE] to Dr. SORIN OXENDINE , who verbally
acknowledged these results.
IMPRESSION: 1. Right inguinal hemorrhage with 3 x 4 x 5 cm hematoma. Small
volume retroperitoneal hemorrhage.
2. Cholelithiasis.

## 2013-07-24 IMAGING — CT CT ANGIO CHEST
2 of 8 series · 15 of 36 positions shown · IV contrast (omnipaque)
Comparison: CT ABD/PELV WO CM dated 07/24/2013;

CLINICAL DATA: Throat pain.  Ascending aortic dissection.

EXAM:
CT ANGIOGRAPHY CHEST WITH CONTRAST
TECHNIQUE: Multidetector CT imaging of the chest was performed using the
standard protocol during bolus administration of intravenous
contrast. Multiplanar CT image reconstructions and MIPs were
obtained to evaluate the vascular anatomy.
CONTRAST:  100mL OMNIPAQUE IOHEXOL 350 MG/ML SOLN

[Series 6: thins · axial · 0.78mm/px · z∈[-307,-28]mm · 14 of 323 slices shown]
[im 22/323  lung]
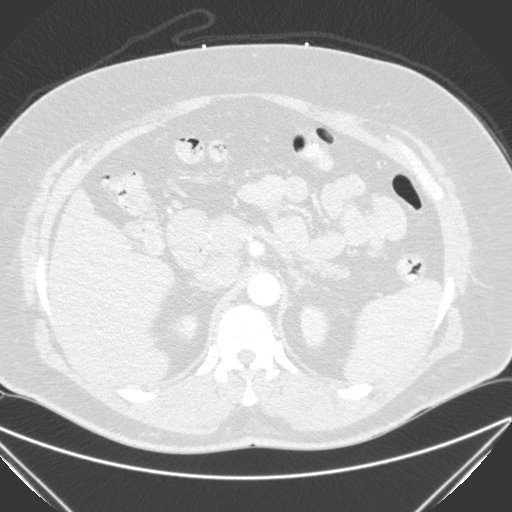
[im 43/323  mediastinal]
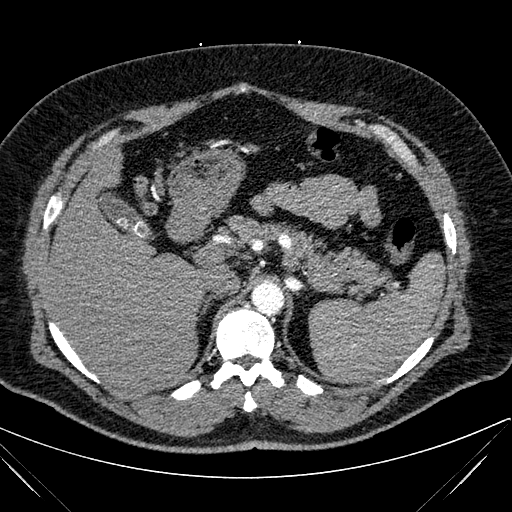
[im 65/323  lung]
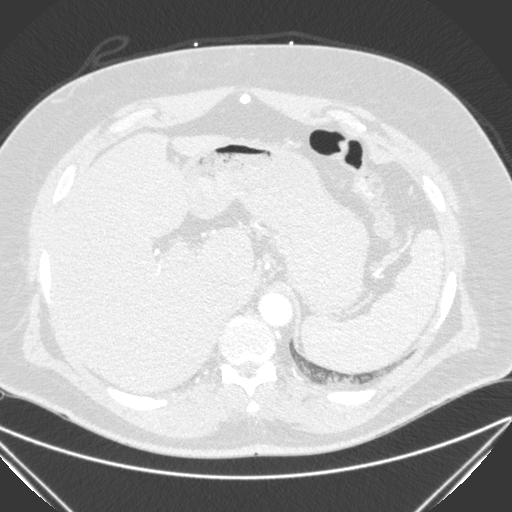
[im 86/323  mediastinal]
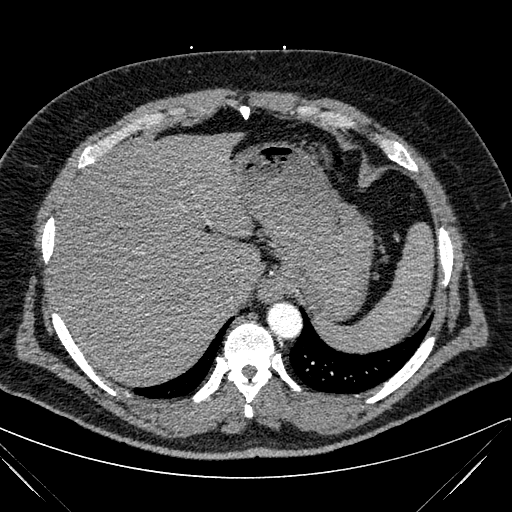
[im 108/323  lung]
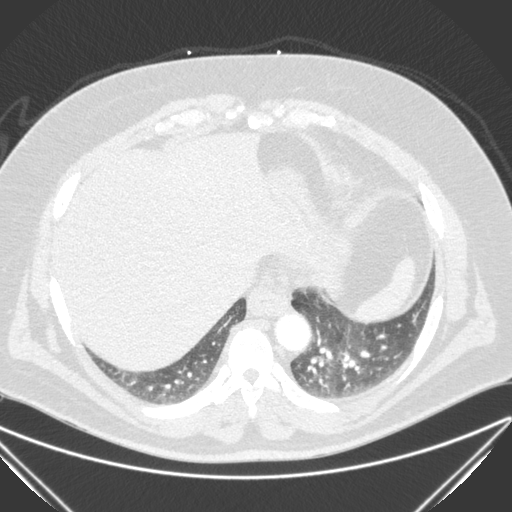
[im 129/323  mediastinal]
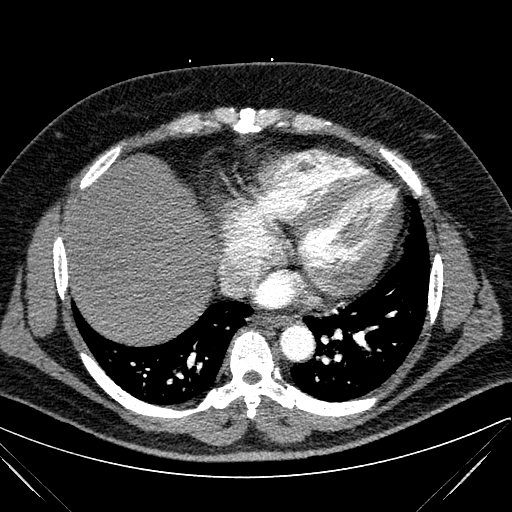
[im 151/323  lung]
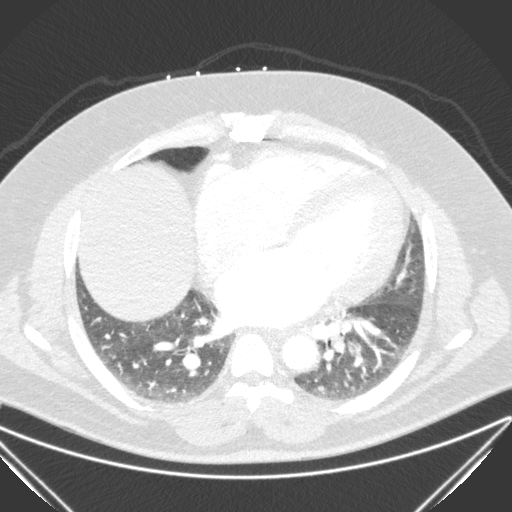
[im 172/323  mediastinal]
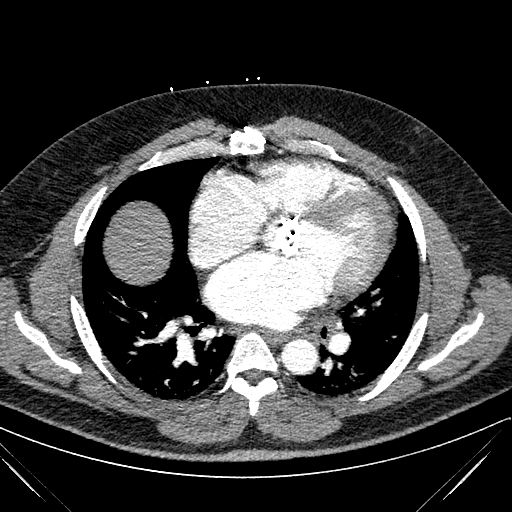
[im 194/323  lung]
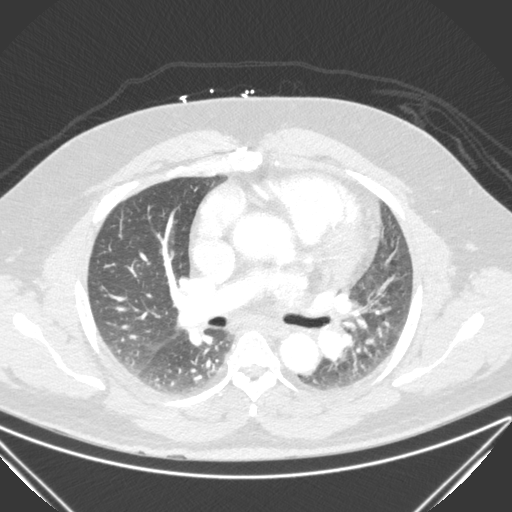
[im 215/323  mediastinal]
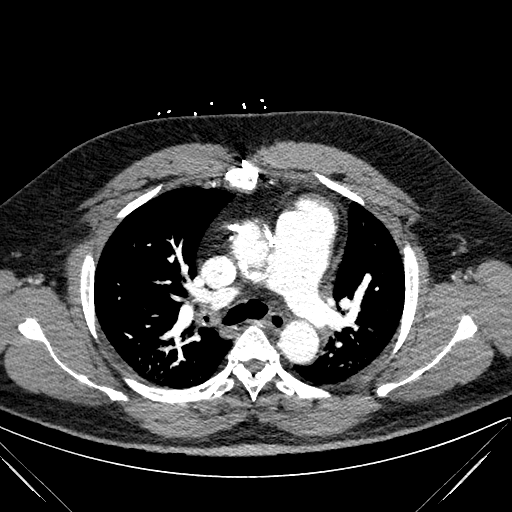
[im 237/323  lung]
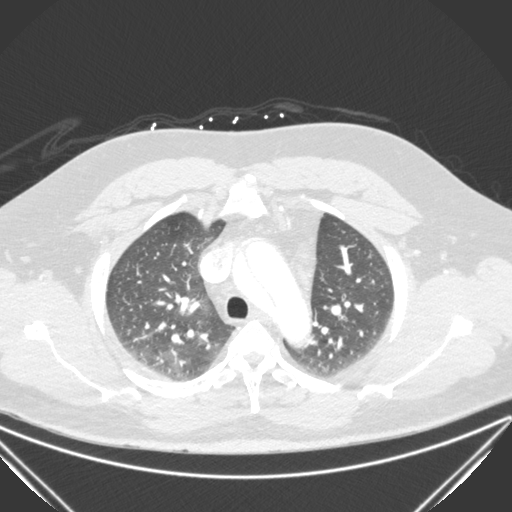
[im 258/323  mediastinal]
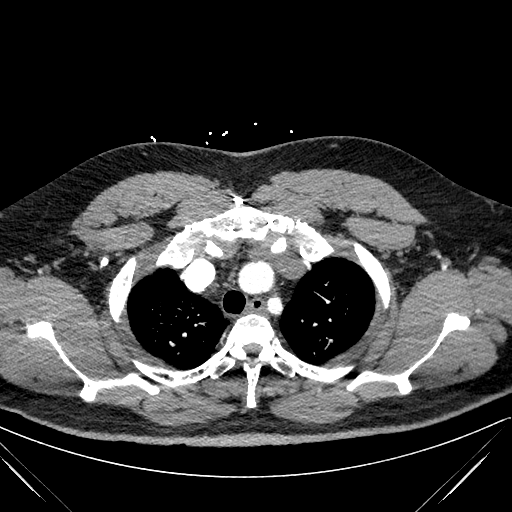
[im 280/323  lung]
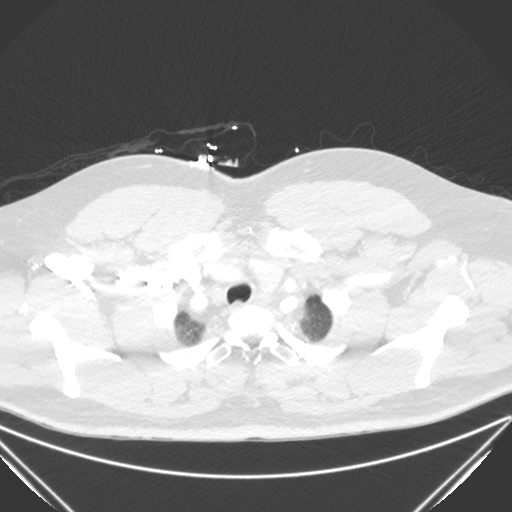
[im 301/323  mediastinal]
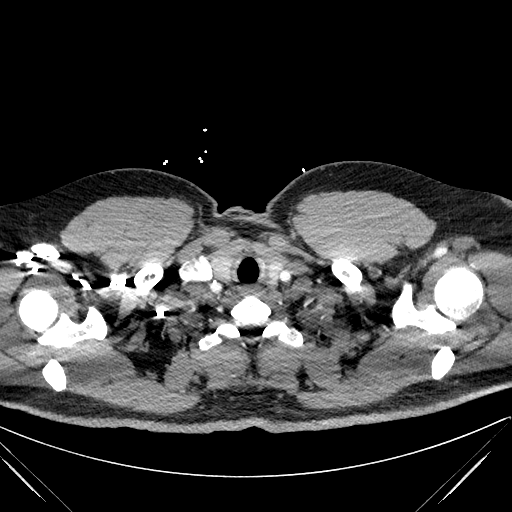

[mpr coronal · coronal · 0.78mm/px · 1 of 139 slices shown]
[im 70/139  mediastinal]
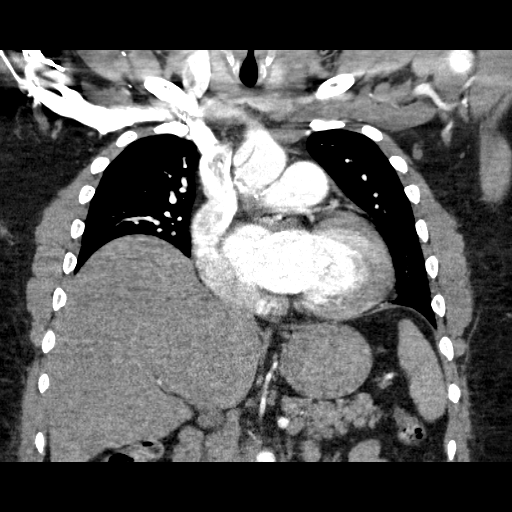

[15 of 36 positions shown; findings below may reference images not displayed]

CT HEART MORPH/PULM
VEIN W/CM&W/O CA SCORE dated 06/30/2010; CT ANGIOGRAPHY CHEST dated
03/24/2013
FINDINGS: No pathologically enlarged mediastinal, hilar or axillary lymph
nodes. Postoperative changes are seen in the ascending aorta with a
residual intimal flap in the distal ascending aorta extending to the
aortic arch, at the takeoff of the left subclavian artery, stable
from 03/24/2013. No evidence of an intramural hematoma. Pulmonary
arteries are markedly enlarged. Heart is enlarged. No pericardial
effusion. There may be mild thickening of the distal esophageal wall
which can be seen with gastroesophageal reflux disease.

Image quality upon review of the lung windows is somewhat degraded
by expiratory phase imaging and respiratory motion. Lungs are
otherwise grossly clear. No pleural fluid. Airway is unremarkable.

Incidental imaging of the upper abdomen shows the liver to be
grossly unremarkable. Stones are seen in the gallbladder. Visualized
portions of the adrenal glands, kidneys, spleen, pancreas, stomach
and bowel are otherwise grossly unremarkable. No upper abdominal
adenopathy. No worrisome lytic or sclerotic lesions.

Review of the MIP images confirms the above findings.
IMPRESSION: 1. Postoperative changes of a [HOSPITAL] type A aortic dissection
repair and aortic valve replacement with an intimal flap arising in
the distal ascending aorta and extending to the origin of the left
subclavian artery, stable.
2. Markedly enlarged pulmonary arteries, indicative of pulmonary
arterial hypertension.
3. Cholelithiasis.

## 2013-07-24 MED ORDER — WARFARIN SODIUM 10 MG PO TABS
10.0000 mg | ORAL_TABLET | Freq: Once | ORAL | Status: AC
  Administered 2013-07-24: 10 mg via ORAL
  Filled 2013-07-24: qty 1

## 2013-07-24 MED ORDER — MORPHINE SULFATE 2 MG/ML IJ SOLN
2.0000 mg | Freq: Once | INTRAMUSCULAR | Status: AC
  Administered 2013-07-24: 1 mg via INTRAVENOUS
  Filled 2013-07-24: qty 1

## 2013-07-24 MED ORDER — ACETAMINOPHEN 325 MG PO TABS
650.0000 mg | ORAL_TABLET | Freq: Four times a day (QID) | ORAL | Status: DC | PRN
  Administered 2013-07-24 – 2013-07-25 (×2): 650 mg via ORAL
  Filled 2013-07-24 (×2): qty 2

## 2013-07-24 MED ORDER — IOHEXOL 350 MG/ML SOLN
100.0000 mL | Freq: Once | INTRAVENOUS | Status: AC | PRN
  Administered 2013-07-24: 100 mL via INTRAVENOUS

## 2013-07-24 MED ORDER — FUROSEMIDE 10 MG/ML IJ SOLN
80.0000 mg | Freq: Once | INTRAMUSCULAR | Status: AC
  Administered 2013-07-24: 80 mg via INTRAVENOUS

## 2013-07-24 MED ORDER — FUROSEMIDE 10 MG/ML IJ SOLN
80.0000 mg | Freq: Two times a day (BID) | INTRAMUSCULAR | Status: DC
  Administered 2013-07-24 – 2013-07-25 (×2): 80 mg via INTRAVENOUS
  Filled 2013-07-24 (×3): qty 8

## 2013-07-24 MED ORDER — MAGNESIUM HYDROXIDE 400 MG/5ML PO SUSP
30.0000 mL | Freq: Once | ORAL | Status: AC
  Administered 2013-07-24: 30 mL via ORAL
  Filled 2013-07-24: qty 30

## 2013-07-24 NOTE — Progress Notes (Signed)
PA and MD on unit to further assess patient. BP 112/59 and HR 61. New orders given. Will continue to monitor patient for further changes in condition.

## 2013-07-24 NOTE — Progress Notes (Signed)
During my morning assessment of patient's right groin, a hematoma is palpable. When asked a series of questions for further clarification. The patient stated that he felt a knot where the procedure had occurred and noticed increase swelling. Patient stated that he noticed this after he had a bowel movent last night during shift change.

## 2013-07-24 NOTE — Progress Notes (Signed)
Per patient this AM, he felt hematoma to right groin after having BM around 7:45 PM, RN notified of swelling at 21:10 last night notified Day shift RN of changes and assessed groin site together this AM. Will continue to monitor. Ronnette Hila, RN

## 2013-07-24 NOTE — Progress Notes (Signed)
Patient Name: [name] Date of Encounter: 07/24/2013   Principal Problem:   Acute diastolic heart failure Active Problems:   Intermediate coronary syndrome   Moderate to severe aortic stenosis   CAD (coronary artery disease) of artery bypass graft   DIABETES MELLITUS, TYPE II   HYPERTENSION   Ascending aortic dissection  in 2000 with aortic valve involvement s/p repair and mechanical AVR   Hematoma of groin   Retroperitoneal bleed   TRANSIENT ISCHEMIC ATTACK, HX OF   PAF (paroxysmal atrial fibrillation)   SUBJECTIVE  No chest pain.  Still with some dyspnea on exertion (walking to bathroom, bathing @ bedside).  -332 ml yesterday (-7.6 for admission).  Wt down to 306 from 307 yesterday (310 on 3/1).  Says baseline weight is 290 (as recent as 3 wks ago).  He was 297 on admission here.  He noticed right groin swelling and pain along with right sided flank/lower back pain overnight.  Call staff was notified and CT showed r groin hematoma & small volume RP bleed.  Heparin was stopped.  Area remains tender.  CURRENT MEDS . atorvastatin  40 mg Oral q1800  . cholecalciferol  1,000 Units Oral Daily  . cloNIDine  0.1 mg Oral BID  . clopidogrel  75 mg Oral Q breakfast  . furosemide  40 mg Intravenous BID  . glimepiride  1 mg Oral Q breakfast  . insulin aspart  0-9 Units Subcutaneous TID WC  . losartan  100 mg Oral Daily  . metoprolol  200 mg Oral Daily  . polyethylene glycol  17 g Oral Daily  . potassium chloride SA  20 mEq Oral BID  . sodium chloride  3 mL Intravenous Q12H  . Warfarin - Pharmacist Dosing Inpatient   Does not apply q1800    OBJECTIVE  Filed Vitals:   07/24/13 0403 07/24/13 0546 07/24/13 0649 07/24/13 0918  BP: 103/62 117/68  122/80  Pulse: 60 58  58  Temp: 98.1 F (36.7 C)   97.7 F (36.5 C)  TempSrc: Oral   Oral  Resp: 16   17  Height:      Weight: 306 lb 3.5 oz (138.9 kg)     SpO2: 94%  100% 98%    Intake/Output Summary (Last 24 hours) at 07/24/13  1002 Last data filed at 07/24/13 0428  Gross per 24 hour  Intake 1492.71 ml  Output   1625 ml  Net -132.29 ml   Filed Weights   07/22/13 0448 07/23/13 0559 07/24/13 0403  Weight: 305 lb (138.347 kg) 307 lb 15.7 oz (139.7 kg) 306 lb 3.5 oz (138.9 kg)    PHYSICAL EXAM  General: Pleasant, NAD. Neuro: Alert and oriented X 3. Moves all extremities spontaneously. Psych: Normal affect. HEENT:  Normal  Neck: Supple without bruits, mod elevated JVP. Lungs:  Resp regular and unlabored, CTA. Heart: RRR, S2 click with 2/6 syst murmur loudest @ the lusb - heard throughout.  no s3, s4, or murmurs. Abdomen: Semi-firm, protuberant, non-tender, BS + x 4.  Extremities: No clubbing, cyanosis.  Trace bilat LE edema. DP/PT/Radials 2+ and equal bilaterally.  R groin site notable for soft hematoma extending medially - tender.  No bruit or bleeding.  Accessory Clinical Findings  CBC  Recent Labs  07/23/13 2227 07/24/13 0500  WBC 6.5 5.6  HGB 11.7* 11.4*  HCT 32.2* 31.9*  MCV 79.9 79.4  PLT 161 585   Basic Metabolic Panel  Recent Labs  07/24/13 0500  NA 142  K 4.2  CL 104  CO2 26  GLUCOSE 134*  BUN 23  CREATININE 1.08  CALCIUM 8.8   Lab Results  Component Value Date   INR 2.28* 07/24/2013   INR 1.94* 07/23/2013   INR 1.62* 07/22/2013   PROTIME 15.9 11/06/2008   PROTIME 15.6 10/23/2008   TELE  rsr/sb  Radiology/Studies  Ct Abdomen Pelvis Wo Contrast  07/24/2013   CLINICAL DATA:  Evaluate for retroperitoneal bleeding.  EXAM: CT ABDOMEN AND PELVIS WITHOUT CONTRAST  TECHNIQUE: Multidetector CT imaging of the abdomen and pelvis was performed following the standard protocol without intravenous contrast.  COMPARISON:  08/13/2008  FINDINGS: BODY WALL: There is stranding in the subcutaneous compartment of the right groin consistent with hemorrhage. There is a discrete hematoma which measures 5 x 3 x 4 cm. There is some extension of hemorrhage into the extraperitoneal space of the right lower  quadrant, without measurable hematoma. No noncontrast evidence of pseudoaneurysm.  LOWER CHEST: Aortic valve replacement.  Probable CABG.  ABDOMEN/PELVIS:  Liver: No focal abnormality.  Biliary: Cholelithiasis.  No biliary obstruction.  Pancreas: Unremarkable.  Spleen: Unremarkable.  Adrenals: Unremarkable.  Kidneys and ureters: No hydronephrosis or stone.  Bladder: Unremarkable.  Reproductive: Unremarkable.  Bowel: No obstruction. Suspect appendectomy. No pericecal inflammation.  Retroperitoneum: No mass or adenopathy.  Peritoneum: No free fluid or gas.  Vascular: No acute abnormality.  OSSEOUS: Bilateral L3 pars defects with no anterolisthesis but focally advanced L3-4 degenerative disc disease.  These results were called by telephone at the time of interpretation on 07/24/2013 at 1:27 AM to Dr. Alwyn Pea , who verbally acknowledged these results.  IMPRESSION: 1. Right inguinal hemorrhage with 3 x 4 x 5 cm hematoma. Small volume retroperitoneal hemorrhage. 2. Cholelithiasis.   Electronically Signed   By: Jorje Guild M.D.   ASSESSMENT AND PLAN  1.  USA/CAD: s/p PCI/BMS to the VG->RCA on 3/2.  No further chest pain, though he continues to experience dyspnea in setting of mod-sev AS and diastolic chf with acute volume overload.  Cont plavix, bb, statin.  No asa in setting of ongoing coumadin anticoagulation with plan to d/c plavix in 30 days and resume asa at that time.  2.  R groin hematoma and small volume RP bleed:  H/H stable.  Site appears stable as well.  Bedrest today.  F/U H/H in AM.  Heparin on hold.  INR remains subRx @ 2.28.  Cont coumadin/plavix.  3.  Acute Diastolic CHF/Mod-Severe AS:  He continues to have evidence of volume overload on exam.  Cont IV diuresis (push to 80 IV bid). Creat stable. HR/BP well-controlled on bb/arb/clonidine.  Baseline weight ~ 290.  4.  Mod-Sev AS: s/p prior Ao dissection with mech AVR.  TEE on 3/2 showed mod-sev AS - likely driving dyspnea and volume  overload.  He is eager to have this surgically corrected but understands that in the setting of recent bare metal stenting that he must complete 30 days of plavix therapy.  He will need outpt TCTS referral.  5.  DM:  On amaryl and SSI.  Also on metformin @ home.  Sugars stable.  Signed, Murray Hodgkins NP  Agree with above. Personally examined.  R groin hematoma Small retrop. Bleed. Off IV hep. On coumadin and plavix. SVR to RCA stent. Continue with IV lasix.   Candee Furbish, MD

## 2013-07-24 NOTE — Progress Notes (Signed)
Pt complaining of 8/10 sharp pain in groin/back. Groin check still level 2 with hematoma, scrotum looks more swollen then last check. Dr. Colon Flattery notified and new order for tylenol. BP 117/68, HR 58. DP pulses +2. Will continue to monitor. Ronnette Hila, RN

## 2013-07-24 NOTE — Progress Notes (Signed)
CT aorta: IMPRESSION:  1. Postoperative changes of a Stanford type A aortic dissection repair and aortic valve replacement with an intimal flap arising in the distal ascending aorta and extending to the origin of the left subclavian artery, stable.  2. Markedly enlarged pulmonary arteries, indicative of pulmonary  arterial hypertension.  3. Cholelithiasis.  Reassuring finding.   If throat pain continues, will consider barium swallow study given recent TEE, however temporal nature of pain (had TEE done on 3/2) seems less likely to be pharyngeal/ esophageal in origin.   Currently feels better. Will monitor.

## 2013-07-24 NOTE — Progress Notes (Signed)
Reassessed patient's pain after 1mg  of morphine and 30mg  of Mylanta had been administered. Patient rated pain level at a 4. Will continue to monitor patient for further changes in condition.

## 2013-07-24 NOTE — Progress Notes (Signed)
Pt assessment completed, VSS, pt denies any pain or discomfort, right groin still at level 2. Pt oriented about fluid restriction, frequently rounding to meet his need, RN and NT direct number provided to the pt. We'll continue with POC.

## 2013-07-24 NOTE — Progress Notes (Signed)
Patient complained of CP and throat burning rating at a 10. VS is as follows Temp.-98.0; BP-94/44; 65, Resp-20, O2-98%. O2 via [place] at 2L applied, STAT EKG obtained, PA notified and will come see patient and follow up. Will continue to monitor patient for further changes in condition.

## 2013-07-24 NOTE — Progress Notes (Addendum)
ANTICOAGULATION CONSULT NOTE - Follow Up Consult  Pharmacy Consult for Coumadin  Indication: afib, mechanical AVR  Allergies  Allergen Reactions  . Insulin Glargine Swelling    edema    Patient Measurements: Height: 6\' 2"  (188 cm) Weight: 306 lb 3.5 oz (138.9 kg) IBW/kg (Calculated) : 82.2 Heparin Dosing Weight:   Vital Signs: Temp: 97.7 F (36.5 C) (03/04 0918) Temp src: Oral (03/04 0918) BP: 107/68 mmHg (03/04 1021) Pulse Rate: 63 (03/04 1021)  Labs:  Recent Labs  07/22/13 0355 07/23/13 0256 07/23/13 1125 07/23/13 2227 07/23/13 2350 07/24/13 0500  HGB 11.9* 11.0*  --  11.7*  --  11.4*  HCT 33.2* 31.1*  --  32.2*  --  31.9*  PLT 148* 168  --  161  --  155  LABPROT 18.8* 21.6*  --   --   --  24.4*  INR 1.62* 1.94*  --   --   --  2.28*  HEPARINUNFRC 0.66 0.72* 0.71*  --  0.51 <0.10*  CREATININE  --   --   --   --   --  1.08    Estimated Creatinine Clearance: 124.1 ml/min (by C-G formula based on Cr of 1.08).   Assessment: 47yom on coumadin for afib and mechanical AVR. Heparin bridge stopped today since patient had R groin hematoma and small volume retroperitoneal bleed. H/H stable. Site appears stable per cardiology. INR is now therapeutic at 2.28. Expect INR to be > 2 in AM. All other labs as above.  Goal of Therapy:  INR 2-3 (Per Dr. Olin Pia Anti-coag notes) Monitor platelets by anticoagulation protocol: Yes   Plan:  -Stop Heparin  -Coumadin to 10 mg PO x1 tonight  - F/u on daily INR and CBC - Monitor for signs of bleed    Albertina Parr, PharmD.  Clinical Pharmacist Pager [phone]

## 2013-07-24 NOTE — Progress Notes (Signed)
ANTICOAGULATION CONSULT NOTE - Follow Up Consult  Pharmacy Consult for Heparin (also on warfarin) Indication: atrial fibrillation, mech AVR  Allergies  Allergen Reactions  . Insulin Glargine Swelling    edema   Patient Measurements: Height: 6\' 2"  (188 cm) Weight: 307 lb 15.7 oz (139.7 kg) IBW/kg (Calculated) : 82.2  Vital Signs: Temp: 97.5 F (36.4 C) (03/04 0008) Temp src: Oral (03/04 0008) BP: 98/59 mmHg (03/04 0008) Pulse Rate: 61 (03/04 0008)  Labs:  Recent Labs  07/21/13 0526  07/22/13 0355 07/23/13 0256 07/23/13 1125 07/23/13 2227 07/23/13 2350  HGB 11.7*  --  11.9* 11.0*  --  11.7*  --   HCT 32.9*  --  33.2* 31.1*  --  32.2*  --   PLT 160  --  148* 168  --  161  --   LABPROT 17.4*  --  18.8* 21.6*  --   --   --   INR 1.47  --  1.62* 1.94*  --   --   --   HEPARINUNFRC 0.22*  < > 0.66 0.72* 0.71*  --  0.51  CREATININE 1.03  --   --   --   --   --   --   < > = values in this interval not displayed.  Estimated Creatinine Clearance: 130.5 ml/min (by C-G formula based on Cr of 1.03).   Medications:  Heparin 2000 units/hr   Assessment: 48 y/o M on heparin/warfarin bridge for afib and mechanical AVR. HL is 0.51 after rate decrease. Other labs as above.   Goal of Therapy:  Heparin level 0.3-0.7 units/ml Monitor platelets by anticoagulation protocol: Yes   Plan:  -Continue heparin at 2000 units/hr -AM HL  -Daily CBC/HL -Monitor for bleeding -Warfarin per previous note  Narda Bonds 07/24/2013,12:31 AM

## 2013-07-24 NOTE — Progress Notes (Addendum)
Began having throat pain. No issues post TEE yesterday, eating well. Will order CTA of chest to eval known dissection extending into ascending aorta.   Reviewed last CTA and there was no gross change since 2011.   BP in right arm 116/56, left 135/81. ECG unremarkable.  Had some tenderness when palpating his throat.   Examined and seen also by Ignacia Bayley NP.  Await CT results.

## 2013-07-24 NOTE — Progress Notes (Signed)
Noted pt on bedrest today. Will f/u tomorrow. Yves Dill CES, ACSM 12:11 PM 07/24/2013

## 2013-07-24 NOTE — Progress Notes (Addendum)
Report given to receiving RN. Patient in bed resting and watching TV. No verbal complaints and no signs nor symptoms of distress or discomfort.

## 2013-07-24 NOTE — Progress Notes (Signed)
Patient evaluated for community based chronic disease management services with [facility] as a benefit of patient's [employer]. Spoke with patient at bedside to explain [facility] services.  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  His ejection fraction 55-50%, prosthetic aortic valve with normal mean gradient of 21 mmHg and mild AI. He is on chronic Coumadin. Last occurrence of AFIB was during an ER visit 03/2012. He is on Coumadin but his admission INR was 1.28. He presented to [facility] on 2.28.15 with complaints of chest pain.  He has a scale at home.  He may benefit from nutrition  Management.  His spouse Ebenezer Mccaskey is his HCPOA her contact number is [phone].  Patient will receive a post discharge transition of care call and will be evaluated for monthly home visits for assessments and disease process education.  Left contact information and [facility] literature at bedside. Made Inpatient Case Manager aware that [facility] following. Of note, [facility] Care Management services does not replace or interfere with any services that are arranged by inpatient case management or social work.  For additional questions or referrals please contact Corliss Blacker BSN RN [facility] Liaison at [phone].

## 2013-07-24 NOTE — Progress Notes (Signed)
Patient has scheduled Plavix that is due. PA made aware of situation that occurred on last night. PA stated that it was ok to administer Plavix. No other new orders given. Will continue to monitor patient for further changes in condition.

## 2013-07-24 NOTE — Plan of Care (Signed)
Progress note  Called to assess patient regarding hematoma.  He reports today new scrotal swelling and swelling at right groin at site of femoral stick s/p cath.  He also reports back pain.  He remains on Heparin due to mechanical AVR.  He is on coumadin.  Last INR 1.9.  A/P -Check stat CBC -Noncontrast CT abd/pel -Close monitoring -Discussed plan with nurse and patient.  Addendum: CT IMPRESSION:  1. Right inguinal hemorrhage with 3 x 4 x 5 cm hematoma. Small  volume retroperitoneal hemorrhage.  Plan: -Monitor CBC -Hold Heparin

## 2013-07-25 ENCOUNTER — Other Ambulatory Visit: Payer: Self-pay | Admitting: Internal Medicine

## 2013-07-25 DIAGNOSIS — I71019 Dissection of thoracic aorta, unspecified: Secondary | ICD-10-CM

## 2013-07-25 DIAGNOSIS — I200 Unstable angina: Secondary | ICD-10-CM | POA: Diagnosis present

## 2013-07-25 DIAGNOSIS — I7101 Dissection of thoracic aorta: Secondary | ICD-10-CM

## 2013-07-25 LAB — BASIC METABOLIC PANEL
BUN: 21 mg/dL (ref 6–23)
CO2: 25 meq/L (ref 19–32)
Calcium: 9.4 mg/dL (ref 8.4–10.5)
Chloride: 99 mEq/L (ref 96–112)
Creatinine, Ser: 1.21 mg/dL (ref 0.50–1.35)
GFR calc Af Amer: 80 mL/min — ABNORMAL LOW (ref >90)
GFR calc non Af Amer: 69 mL/min — ABNORMAL LOW (ref >90)
GLUCOSE: 125 mg/dL — AB (ref 70–99)
POTASSIUM: 4.2 meq/L (ref 3.7–5.3)
SODIUM: 140 meq/L (ref 137–147)

## 2013-07-25 LAB — GLUCOSE, CAPILLARY
GLUCOSE-CAPILLARY: 109 mg/dL — AB (ref 70–99)
Glucose-Capillary: 126 mg/dL — ABNORMAL HIGH (ref 70–99)

## 2013-07-25 LAB — CBC
HCT: 33.4 % — ABNORMAL LOW (ref 39.0–52.0)
HEMOGLOBIN: 11.9 g/dL — AB (ref 13.0–17.0)
MCH: 28.3 pg (ref 26.0–34.0)
MCHC: 35.6 g/dL (ref 30.0–36.0)
MCV: 79.5 fL (ref 78.0–100.0)
Platelets: 179 10*3/uL (ref 150–400)
RBC: 4.2 MIL/uL — AB (ref 4.22–5.81)
RDW: 15.5 % (ref 11.5–15.5)
WBC: 5.9 10*3/uL (ref 4.0–10.5)

## 2013-07-25 LAB — PROTIME-INR
INR: 2.31 — AB (ref 0.00–1.49)
PROTHROMBIN TIME: 24.6 s — AB (ref 11.6–15.2)

## 2013-07-25 MED ORDER — ACETAMINOPHEN 325 MG PO TABS: 650.0000 mg | ORAL_TABLET | Freq: Four times a day (QID) | ORAL | Status: DC | PRN

## 2013-07-25 MED ORDER — OXYCODONE HCL 5 MG PO TABS
5.0000 mg | ORAL_TABLET | Freq: Once | ORAL | Status: AC
  Administered 2013-07-25: 5 mg via ORAL
  Filled 2013-07-25: qty 1

## 2013-07-25 MED ORDER — CLOPIDOGREL BISULFATE 75 MG PO TABS: 75.0000 mg | ORAL_TABLET | Freq: Every day | ORAL | Status: DC

## 2013-07-25 MED ORDER — NITROGLYCERIN 0.4 MG SL SUBL: 0.4000 mg | SUBLINGUAL_TABLET | SUBLINGUAL | Status: DC | PRN

## 2013-07-25 MED ORDER — FUROSEMIDE 40 MG PO TABS: 40.0000 mg | ORAL_TABLET | Freq: Two times a day (BID) | ORAL | Status: DC

## 2013-07-25 NOTE — Progress Notes (Signed)
Cardiologist - Dr. Stanford Breed  Subjective:  Scotal edema resolved Mild hematoma Right groin not as painful. Improved.  SOB with ambulation improved. Yesterday had throat pain. CT aorta unchanged.    Objective:  Vital Signs in the last 24 hours: Temp:  [97.6 F (36.4 C)-98 F (36.7 C)] 97.6 F (36.4 C) (03/05 0649) Pulse Rate:  [56-65] 56 (03/05 0649) Resp:  [16-20] 19 (03/05 0649) BP: (93-112)/(44-69) 103/66 mmHg (03/05 0649) SpO2:  [96 %-99 %] 97 % (03/05 0649) Weight:  [299 lb 9.7 oz (135.9 kg)] 299 lb 9.7 oz (135.9 kg) (03/05 0649)  Intake/Output from previous day: 03/04 0701 - 03/05 0700 In: 838 [P.O.:838] Out: 4225 [Urine:4225]   Physical Exam: General: Well developed, well nourished, in no acute distress. Head:  Normocephalic and atraumatic. Mild JVD Lungs: Clear to auscultation and percussion. Heart: Normal S1 and click S2.  No murmur, rubs or gallops.  Abdomen: soft, non-tender, positive bowel sounds. obese Extremities: No clubbing or cyanosis. no edema. R groin hematoma improved Neurologic: Alert and oriented x 3.    Lab Results:  Recent Labs  07/24/13 0500 07/25/13 0500  WBC 5.6 5.9  HGB 11.4* 11.9*  PLT 155 179    Recent Labs  07/24/13 0500 07/25/13 0500  NA 142 140  K 4.2 4.2  CL 104 99  CO2 26 25  GLUCOSE 134* 125*  BUN 23 21  CREATININE 1.08 1.21   Telemetry: NSR Personally viewed.   EKG:  NSR NSSTW changes  Cardiac Studies:  TEE: 07/22/13 - Left ventricle: Systolic function was normal. The estimated ejection fraction was in the range of 50% to 55%. Diffuse hypokinesis. - Aortic valve: A Medtronic-Hall mechanical prosthesis was present. The prosthesis disk had a normal range of motion. The sewing ring appeared normal. There was moderate to severe stenosis. Acoustic shadowing precludes estimation of pannus formation. Valve area: 0.96cm^2(VTI). Valve area: 0.96cm^2 (Vmax). Gradients are likely uderestimated due to limited ability for  beam alignment from the esophagus. Higher gradients were recorded by transthoracic imaging. - Aorta: Segment of aortic root graft replacement ([place]). There is a short segment of dissection in the ascending aorta, beyond the proximal graft anastomosis, ending before the arch. There is brisk flow in both the true and the false lumen. The overall aortic diameter is approximately 4 cm in this segment. - Mitral valve: No evidence of vegetation. Mild regurgitation. - Left atrium: The atrium was moderately dilated. No evidence of thrombus in the atrial cavity or appendage. No spontaneous echo contrast was observed. - Right ventricle: The cavity size was mildly dilated. Wall thickness was normal. - Right atrium: The atrium was dilated. - Atrial septum: No defect or patent foramen ovale was identified. Echo contrast study showed no right-to-left atrial level shunt, following an increase in RA pressure induced by provocative maneuvers. - Tricuspid valve: No evidence of vegetation. Moderate regurgitation directed centrally. - Pulmonic valve: No evidence of vegetation.  [name] atorvastatin  40 mg Oral q1800  . cholecalciferol  1,000 Units Oral Daily  . cloNIDine  0.1 mg Oral BID  . clopidogrel  75 mg Oral Q breakfast  . furosemide  80 mg Intravenous BID  . glimepiride  1 mg Oral Q breakfast  . insulin aspart  0-9 Units Subcutaneous TID WC  . losartan  100 mg Oral Daily  . metoprolol  200 mg Oral Daily  . polyethylene glycol  17 g Oral Daily  . potassium chloride SA  20 mEq Oral BID  . sodium chloride  3 mL Intravenous Q12H  . Warfarin - Pharmacist Dosing Inpatient   Does not apply q1800   CT aorta:  IMPRESSION:  1. Postoperative changes of a Stanford type A aortic dissection repair and aortic valve replacement with an intimal flap arising in the distal ascending aorta and extending to the origin of the left subclavian artery, stable.  2. Markedly enlarged pulmonary arteries, indicative of  pulmonary  arterial hypertension.  3. Cholelithiasis.  Reassuring finding.    Assessment/Plan:  Principal Problem:   Acute diastolic heart failure Active Problems:   DIABETES MELLITUS, TYPE II   HYPERTENSION   TRANSIENT ISCHEMIC ATTACK, HX OF   Ascending aortic dissection  in 2000 with aortic valve involvement s/p repair and mechanical AVR   PAF (paroxysmal atrial fibrillation)   Intermediate coronary syndrome   Moderate to severe aortic stenosis   CAD (coronary artery disease) of artery bypass graft   Hematoma of groin   Retroperitoneal bleed   48 year old male with Medtronic Hall single disk mechanical valve (placed in ?2000) with elevated transvalvular velocity progressively worsening with CAD post CABG, PCI of SVG to RCA on 07/19/13 (BMS) with acute diastolic heart failure, type 1 aortic dissection stable.   1) CAD  - stable post PCI to SVG RCA graft.  - Continue triple therapy, Plavix  Coumadin for 1 month. Will likely stop Plavix after one month (BMS) 08/16/13. Start ASA and continue coumadin at that time.   2) Aortic valve disorder  - Mechanical  - Concerning increased velocity/gradient  - He understands that repeat AVR is likely in the future.  - Dyspnea may be in part from stenosis.  - Reviewed Dr. Johnsie Cancel, [place] notes.   - Opens well on flouro.  - No suggestion of thrombus.   - I would like him to see TCTS for review/ consultation as outpatient. Please set up.   - INR at goal.   3) Acute diastolic HF  - Will change to PO lasix 40mg  PO BID. Needs BMET in one week.  - Scrotal edema improved  - Dyspnea improved with diuresis  (note aortic stenosis)  -  BP stable.  - NEEDS one week follow up with Dr. Stanford Breed or APP.   4) HL  - atorvastatin  5) Aortic root replacement ([place]entall)  - Dual lumens seen on TEE  - Dissection type A stable on CT.  - Throat pain resolved. Unclear etiology.   WEIGHT ON DC 298.  SKAINS, [place] 07/25/2013, 10:05 AM

## 2013-07-25 NOTE — Discharge Instructions (Signed)
Coronary Angiography with Stent Coronary angiography with stent placement is a procedure to widen or open a narrow blood vessel of the heart (coronary artery). When a coronary artery becomes partially blocked, it decreases blood flow to that area. This may lead to chest pain or a heart attack (myocardial infarction). Arteries may become blocked by cholesterol buildup (plaque) in the lining or wall.  A stent is a small piece of metal that looks like a mesh or a spring. Stent placement may be done right after a coronary angiography in which a blocked artery is found or as a treatment for a heart attack.  LET YOUR HEALTH CARE PROVIDER KNOW ABOUT:  Any allergies you have.   All medicines you are taking, including vitamins, herbs, eye drops, creams, and over-the-counter medicines.   Previous problems you or members of your family have had with the use of anesthetics.   Any blood disorders you have.   Previous surgeries you have had.   Medical conditions you have. RISKS AND COMPLICATIONS Generally, coronary angiography with stent is a safe procedure. However, as with any procedure, complications can occur. Possible complications include:   Damage to the heart or its blood vessels.   A return of blockage.   Bleeding at the site.   Blood clot in another part of the body.   Kidney injury.   Allergic reaction to the dye or contrast used.  BEFORE THE PROCEDURE  Do not eat or drink anything for 6 hours before the procedure.   Ask your health care provider if medicines can be taken with a sip of water.   Your health care provider will make sure you understand the procedure and the risks and potential complications associated with the procedure.  PROCEDURE  You may be given a medicine to help you relax before and during the procedure (sedative). This medicine will be given through an IV tube that is put into one of your veins.   The area where the catheter will be inserted  is shaved and cleaned. This is usually done in the groin but may be done in the fold of your arm (near your elbow) or in the wrist.   A medicine will be given to numb the area where the catheter will be inserted (local anesthetic).   The catheter is inserted into an artery using a guide wire. A type of X-ray (fluoroscopy) is used to help guide the catheter to the opening of the blocked artery.   A dye is then injected into the catheter, and X-rays are taken. The dye helps to show where any narrowing or blockages are located in the heart arteries.   A tiny wire is guided to the blocked spot, and a balloon is inflated to make the artery wider. The stent is expanded and crushes the plaque into the wall of the vessel. The stent holds the area open like a scaffolding and improves the blood flow.   Sometimes the artery may be made wider using a laser or other tools to remove plaque.   When the blood flow is better, the catheter is removed. The lining of the artery will grow over the stent, which stays where it was placed.  AFTER THE PROCEDURE  If the procedure is done through the leg, you will be kept in bed lying flat for about 6 hours. You will be instructed to not bend or cross your legs.   The insertion site will be checked frequently.   The pulse in your   feet or wrist will be checked frequently.   Additional blood tests, X-rays, and electrocardiography may be done. Document Released: 11/13/2002 Document Revised: 02/27/2013 Document Reviewed: 11/15/2012 [employer] Patient Information 2014 [employer].  

## 2013-07-25 NOTE — Plan of Care (Signed)
Problem: Phase III Progression Outcomes Goal: Vascular site scale level 0 - I Vascular Site Scale Level 0: No bruising/bleeding/hematoma Level I (Mild): Bruising/Ecchymosis, minimal bleeding/ooozing, palpable hematoma < 3 cm Level II (Moderate): Bleeding not affecting hemodynamic parameters, pseudoaneurysm, palpable hematoma > 3 cm Level III (Severe) Bleeding which affects hemodynamic parameters or retroperitoneal hemorrhage  Outcome: Not Met (add Reason) Pt's groin site level 2.

## 2013-07-25 NOTE — Progress Notes (Signed)
DC IV, DC Tele, DC Home. Discharge instructions and home medications discussed with patient and family. Patient and family denied any questions or concerns at this time. Patient leaving unit via wheelchair and appears in no acute distress.

## 2013-07-25 NOTE — Discharge Summary (Signed)
Personally seen and examined. Agree with above. Please see progress note for details.   AVR - mechanical. Elevated transvalvular velocity. Normal appearance on flouro. TEE done as well. Will have outpatient [facility] consult to discuss future options/ redo AVR vs. Continued waiting.   Acute diastolic HF - dyspnea was improved with diuresis. This may be driving his symptoms. Try to maintain fluid balance. Close follow up.   Aortic dissection - Type A. Stable on CT done during this admit.

## 2013-07-25 NOTE — Discharge Summary (Signed)
Patient ID: [name],  MRN: [id number], DOB/AGE: [birth year] 48 y.o.  Admit date: 07/19/2013 Discharge date: 07/25/2013  Primary Care Provider: Dr Cathlean Cower Primary Cardiologist: Dr Stanford Breed  Discharge Diagnoses Principal Problem:   Unstable angina Active Problems:   Acute diastolic heart failure   CAD of artery bypass graft- SVG-RCA BMS 2/271/5   DIABETES MELLITUS, TYPE II   Ascending aortic dissection  in 2000- s/p Bentall and MDT tilting disc AVR   Moderate to severe aortic stenosis- pt will need AVR revision   HYPERLIPIDEMIA   HYPERTENSION   TRANSIENT ISCHEMIC ATTACK, HX OF   PAF (paroxysmal atrial fibrillation)   Hematoma of groin    Procedures: TEE 07/22/13                        SVG-RCA BMS 07/19/13   Hospital Course:  [name] is a 47 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 He had a myoview in 2011 showing prominent inferior scar with very mild reversibility. A cardiac CT done 06/2010 showed only mild plaque in RCA. He was last seen by cardiology in 03/2013 during admission for CP deemed musculoskeletal. A 2D echo in 02/2013 showed mod LVH, EF 55-50%, prosthetic aortic valve with normal mean gradient of 21 mmHg and mild AI. He is on chronic Coumadin. Last occurrence of AF was during an ER visit 03/2012.           He was admitted 07/19/13 with dyspnea and chest pain with exertion. His INR was subtheraputic. He underwent diagnostic cath 07/19/13 by Dr Irish Lack and this showed a 90% proximal SVG-RCA stenosis. This was dilated with a BMS. His BNP was elevated and he was placed on Lasix. An echo 07/22/13 revealed moderate to severe stenosis of the [employer]-Hall AVR. There also was an area of dissection noted at the aortic root replacement. He was placed back on Heparin. Plan is for Plavix and Coumadin X one month, then stop Plavix and resume low dose  aspirin. He will need follow up with [facility] and they will contact the pt. Dr Marlou Porch feels he can be discharged 07/25/13.   Discharge Vitals:  Blood pressure 112/59, pulse 57, temperature 97.6 F (36.4 C), temperature source Oral, resp. rate 19, height _0  (1.88 m), weight 299 lb 9.7 oz (135.9 kg), SpO2 97.00%.    Labs: Results for orders placed during the hospital encounter of 07/19/13 (from the past 48 hour(s))  GLUCOSE, CAPILLARY     Status: Abnormal   Collection Time    07/23/13  4:21 PM      Result Value Ref Range   Glucose-Capillary 133 (*) 70 - 99 mg/dL   Comment 1 Notify RN    GLUCOSE, CAPILLARY     Status: Abnormal   Collection Time    07/23/13  8:19 PM      Result Value Ref Range   Glucose-Capillary 402 (*) 70 - 99 mg/dL  GLUCOSE, CAPILLARY     Status: None   Collection Time    07/23/13  8:56 PM      Result Value Ref Range   Glucose-Capillary 96  70 - 99 mg/dL  CBC     Status: Abnormal   Collection Time    07/23/13 10:27 PM      Result Value Ref Range   WBC 6.5  4.0 - 10.5 K/uL  RBC 4.03 (*) 4.22 - 5.81 MIL/uL   Hemoglobin 11.7 (*) 13.0 - 17.0 g/dL   HCT 32.2 (*) 39.0 - 52.0 %   MCV 79.9  78.0 - 100.0 fL   MCH 29.0  26.0 - 34.0 pg   MCHC 36.3 (*) 30.0 - 36.0 g/dL   RDW 15.4  11.5 - 15.5 %   Platelets 161  150 - 400 K/uL  HEPARIN LEVEL (UNFRACTIONATED)     Status: None   Collection Time    07/23/13 11:50 PM      Result Value Ref Range   Heparin Unfractionated 0.51  0.30 - 0.70 IU/mL   Comment:            IF HEPARIN RESULTS ARE BELOW     EXPECTED VALUES, AND PATIENT     DOSAGE HAS BEEN CONFIRMED,     SUGGEST FOLLOW UP TESTING     OF ANTITHROMBIN III LEVELS.  PROTIME-INR     Status: Abnormal   Collection Time    07/24/13  5:00 AM      Result Value Ref Range   Prothrombin Time 24.4 (*) 11.6 - 15.2 seconds   INR 2.28 (*) 0.00 - 1.49  HEPARIN LEVEL (UNFRACTIONATED)     Status: Abnormal   Collection Time    07/24/13  5:00 AM      Result Value Ref Range    Heparin Unfractionated <0.10 (*) 0.30 - 0.70 IU/mL   Comment:            IF HEPARIN RESULTS ARE BELOW     EXPECTED VALUES, AND PATIENT     DOSAGE HAS BEEN CONFIRMED,     SUGGEST FOLLOW UP TESTING     OF ANTITHROMBIN III LEVELS.  CBC     Status: Abnormal   Collection Time    07/24/13  5:00 AM      Result Value Ref Range   WBC 5.6  4.0 - 10.5 K/uL   RBC 4.02 (*) 4.22 - 5.81 MIL/uL   Hemoglobin 11.4 (*) 13.0 - 17.0 g/dL   HCT 31.9 (*) 39.0 - 52.0 %   MCV 79.4  78.0 - 100.0 fL   MCH 28.4  26.0 - 34.0 pg   MCHC 35.7  30.0 - 36.0 g/dL   RDW 15.5  11.5 - 15.5 %   Platelets 155  150 - 400 K/uL  BASIC METABOLIC PANEL     Status: Abnormal   Collection Time    07/24/13  5:00 AM      Result Value Ref Range   Sodium 142  137 - 147 mEq/L   Potassium 4.2  3.7 - 5.3 mEq/L   Chloride 104  96 - 112 mEq/L   CO2 26  19 - 32 mEq/L   Glucose, Bld 134 (*) 70 - 99 mg/dL   BUN 23  6 - 23 mg/dL   Creatinine, Ser 1.08  0.50 - 1.35 mg/dL   Calcium 8.8  8.4 - 10.5 mg/dL   GFR calc non Af Amer 79 (*) >90 mL/min   GFR calc Af Amer >90  >90 mL/min   Comment: (NOTE)     The eGFR has been calculated using the CKD EPI equation.     This calculation has not been validated in all clinical situations.     eGFR's persistently <90 mL/min signify possible Chronic Kidney     Disease.  GLUCOSE, CAPILLARY     Status: Abnormal   Collection Time    07/24/13  5:58  AM      Result Value Ref Range   Glucose-Capillary 133 (*) 70 - 99 mg/dL  GLUCOSE, CAPILLARY     Status: Abnormal   Collection Time    07/24/13 11:45 AM      Result Value Ref Range   Glucose-Capillary 128 (*) 70 - 99 mg/dL  GLUCOSE, CAPILLARY     Status: Abnormal   Collection Time    07/24/13  4:38 PM      Result Value Ref Range   Glucose-Capillary 130 (*) 70 - 99 mg/dL  GLUCOSE, CAPILLARY     Status: Abnormal   Collection Time    07/24/13  8:45 PM      Result Value Ref Range   Glucose-Capillary 128 (*) 70 - 99 mg/dL  PROTIME-INR     Status:  Abnormal   Collection Time    07/25/13  5:00 AM      Result Value Ref Range   Prothrombin Time 24.6 (*) 11.6 - 15.2 seconds   INR 2.31 (*) 0.00 - 1.49  CBC     Status: Abnormal   Collection Time    07/25/13  5:00 AM      Result Value Ref Range   WBC 5.9  4.0 - 10.5 K/uL   RBC 4.20 (*) 4.22 - 5.81 MIL/uL   Hemoglobin 11.9 (*) 13.0 - 17.0 g/dL   HCT 33.4 (*) 39.0 - 52.0 %   MCV 79.5  78.0 - 100.0 fL   MCH 28.3  26.0 - 34.0 pg   MCHC 35.6  30.0 - 36.0 g/dL   RDW 15.5  11.5 - 15.5 %   Platelets 179  150 - 400 K/uL  BASIC METABOLIC PANEL     Status: Abnormal   Collection Time    07/25/13  5:00 AM      Result Value Ref Range   Sodium 140  137 - 147 mEq/L   Potassium 4.2  3.7 - 5.3 mEq/L   Chloride 99  96 - 112 mEq/L   CO2 25  19 - 32 mEq/L   Glucose, Bld 125 (*) 70 - 99 mg/dL   BUN 21  6 - 23 mg/dL   Creatinine, Ser 1.21  0.50 - 1.35 mg/dL   Calcium 9.4  8.4 - 10.5 mg/dL   GFR calc non Af Amer 69 (*) >90 mL/min   GFR calc Af Amer 80 (*) >90 mL/min   Comment: (NOTE)     The eGFR has been calculated using the CKD EPI equation.     This calculation has not been validated in all clinical situations.     eGFR's persistently <90 mL/min signify possible Chronic Kidney     Disease.  GLUCOSE, CAPILLARY     Status: Abnormal   Collection Time    07/25/13  5:47 AM      Result Value Ref Range   Glucose-Capillary 126 (*) 70 - 99 mg/dL    Disposition:      Follow-up Information   Follow up with Truitt Merle, NP On 07/31/2013. (2:00 PM)    Specialty:  Nurse Practitioner   Contact information:   [place] [place] [address] [phone]       Follow up with [facility] On 07/31/2013. (2:45 PM)    Specialty:  Cardiology   Contact information:   [address], [place] [address] [place] [address] [phone]      Follow up with [facility]. (Office will contact you  for an appointment)    Contact information:   [place] [id number]       Discharge Medications:    Medication List    STOP taking these medications       aspirin 81 MG EC tablet      TAKE these medications       acetaminophen 325 MG tablet  Commonly known as:  TYLENOL  Take 2 tablets (650 mg total) by mouth every 6 (six) hours as needed for moderate pain or headache.     cloNIDine 0.1 MG tablet  Commonly known as:  CATAPRES  Take 0.1 mg by mouth 2 (two) times daily.     clopidogrel 75 MG tablet  Commonly known as:  PLAVIX  Take 1 tablet (75 mg total) by mouth daily with breakfast.     furosemide 40 MG tablet  Commonly known as:  LASIX  Take 1 tablet (40 mg total) by mouth 2 (two) times daily.     glimepiride 1 MG tablet  Commonly known as:  AMARYL  Take 1 mg by mouth daily with breakfast.     losartan 100 MG tablet  Commonly known as:  COZAAR  Take 100 mg by mouth daily.     metFORMIN 1000 MG tablet  Commonly known as:  GLUCOPHAGE  Take 1,000 mg by mouth 2 (two) times daily with a meal.     metoprolol 200 MG 24 hr tablet  Commonly known as:  TOPROL-XL  Take 200 mg by mouth daily.     nitroGLYCERIN 0.4 MG SL tablet  Commonly known as:  NITROSTAT  Place 1 tablet (0.4 mg total) under the tongue every 5 (five) minutes x 3 doses as needed for chest pain.     potassium chloride SA 20 MEQ tablet  Commonly known as:  K-DUR,KLOR-CON  Take 20 mEq by mouth 2 (two) times daily.     pravastatin 40 MG tablet  Commonly known as:  PRAVACHOL  Take 40 mg by mouth daily.     Vitamin D3 1000 UNITS Caps  Take 1,000 Units by mouth daily.     warfarin 5 MG tablet  Commonly known as:  COUMADIN  Take 5-10 mg by mouth daily. Takes 30m Thurs and Sun.  533mall other days         Duration of Discharge Encounter: Greater than 30 minutes including physician time.  SiAngelena FormA-C 07/25/2013 11:29 AM

## 2013-07-30 ENCOUNTER — Telehealth: Payer: Self-pay | Admitting: Cardiology

## 2013-07-30 NOTE — Telephone Encounter (Signed)
New problem   Pt had cath and is having pain and swelling at site area also his sac is swollen. Please call pt.

## 2013-07-30 NOTE — Telephone Encounter (Signed)
Spoke with pt wife, he has a grape size knot at the groin stick site from his cath. It has been there since discharge and has not changed. It is not red and does not look angry. He also has swelling of the scrotum, that has improved since discharge. Reassurance given to pt and wife, this is the normal action of the body healing from the procedure. They were told to elevate the scrotum on a towel when he is sitting or lying. That will help with the swelling. He has pain med for the soreness at the site. He has a follow up appt with lori gerhardt np tomorrow and will make sure to come to that appt. They will call back today if anything changes. Pt agreed with this plan.

## 2013-07-30 NOTE — Telephone Encounter (Signed)
Unable to reach pt or leave a message  

## 2013-07-31 ENCOUNTER — Ambulatory Visit (INDEPENDENT_AMBULATORY_CARE_PROVIDER_SITE_OTHER): Payer: Medicare Other | Admitting: Nurse Practitioner

## 2013-07-31 ENCOUNTER — Ambulatory Visit (INDEPENDENT_AMBULATORY_CARE_PROVIDER_SITE_OTHER): Payer: Medicare Other | Admitting: Pharmacist

## 2013-07-31 ENCOUNTER — Encounter: Payer: Self-pay | Admitting: Nurse Practitioner

## 2013-07-31 ENCOUNTER — Encounter: Payer: Medicare Other | Admitting: Nurse Practitioner

## 2013-07-31 VITALS — BP 110/80 | HR 86 | Ht 74.0 in | Wt 301.4 lb

## 2013-07-31 DIAGNOSIS — I5032 Chronic diastolic (congestive) heart failure: Secondary | ICD-10-CM | POA: Diagnosis not present

## 2013-07-31 DIAGNOSIS — I200 Unstable angina: Secondary | ICD-10-CM

## 2013-07-31 DIAGNOSIS — Z8679 Personal history of other diseases of the circulatory system: Secondary | ICD-10-CM

## 2013-07-31 DIAGNOSIS — R0609 Other forms of dyspnea: Secondary | ICD-10-CM | POA: Diagnosis not present

## 2013-07-31 DIAGNOSIS — I480 Paroxysmal atrial fibrillation: Secondary | ICD-10-CM

## 2013-07-31 DIAGNOSIS — I4891 Unspecified atrial fibrillation: Secondary | ICD-10-CM | POA: Diagnosis not present

## 2013-07-31 DIAGNOSIS — R0600 Dyspnea, unspecified: Secondary | ICD-10-CM

## 2013-07-31 DIAGNOSIS — R0989 Other specified symptoms and signs involving the circulatory and respiratory systems: Secondary | ICD-10-CM

## 2013-07-31 LAB — BASIC METABOLIC PANEL
BUN: 22 mg/dL (ref 6–23)
CO2: 25 mEq/L (ref 19–32)
Calcium: 9.3 mg/dL (ref 8.4–10.5)
Chloride: 104 mEq/L (ref 96–112)
Creatinine, Ser: 1.2 mg/dL (ref 0.4–1.5)
GFR: 83.75 mL/min (ref >60.00)
Glucose, Bld: 138 mg/dL — ABNORMAL HIGH (ref 70–99)
Potassium: 3.7 mEq/L (ref 3.5–5.1)
Sodium: 136 mEq/L (ref 135–145)

## 2013-07-31 LAB — CBC
HCT: 38 % — ABNORMAL LOW (ref 39.0–52.0)
Hemoglobin: 12.9 g/dL — ABNORMAL LOW (ref 13.0–17.0)
MCHC: 33.9 g/dL (ref 30.0–36.0)
MCV: 83.9 fl (ref 78.0–100.0)
Platelets: 212 10*3/uL (ref 150.0–400.0)
RBC: 4.53 Mil/uL (ref 4.22–5.81)
RDW: 15.7 % — ABNORMAL HIGH (ref 11.5–14.6)
WBC: 6 10*3/uL (ref 4.5–10.5)

## 2013-07-31 LAB — BRAIN NATRIURETIC PEPTIDE: Pro B Natriuretic peptide (BNP): 47 pg/mL (ref 0.0–100.0)

## 2013-07-31 LAB — POCT INR: INR: 1.9

## 2013-07-31 MED ORDER — METOPROLOL SUCCINATE ER 200 MG PO TB24: 200.0000 mg | ORAL_TABLET | Freq: Every day | ORAL | Status: DC

## 2013-07-31 NOTE — Patient Instructions (Addendum)
Stay on your current medicines  I have sent in a refill for the metoprolol - take just a half a tablet for 3 days and then go back to your full dose  We will check labs today  We need to recheck your INR in one week  We will refer you to [facility] - see Dr. Cyndia Bent next Wednesday, March 18th at 9:45am ([facility] 4th floor)  See Dr. Stanford Breed in 2 weeks  Call the [facility] office at [phone] if you have any questions, problems or concerns.

## 2013-07-31 NOTE — Progress Notes (Signed)
[name] Date of Birth: [birth year] Medical Record #[id number]  History of Present Illness: [name] is seen back today for a post hospital visit. Seen for Dr. Stanford [name]. 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 Edward [name] in October of 2000. He is on chronic coumadin with a goal of 2 to 3 noted.   He had a Myoview in 2011 showing prominent inferior scar with very mild reversibility. Cardiac CT in 06/2010 showed only mild plaque in the RCA. Last seen here in November of 2014 for chest pain that was deemed musculoskeletal.   Most recently admitted with chest pain and dyspnea. INR was subtherapeutic. He was cathed - showing a 90% proximal SVT to RCA stenosis - this was intervened on with a BMS. Echo showed moderate to severe stenosis of the mechanical Medtronic Hall AVR. Dr. Aundra [name] has noted that "the mean gradient across the valve on recent echo was 36 mmHg, this is higher than prior and concerning as INR was therapeutic at admission. However, fluoroscopy of valve showed normal mechanical valve function, doubt partial thrombosis, etc."   Dr. Johnsie [name] has noted that "Review of echos as far back as 2007 show persistant Velocities by CW in 50m/sec range. In 2012 was as high as 3.43m/sec. Any velocity over 33m/sec is abnormal no matter What valve or size. Valve placed in [facility] [place] ? 2000 On fluro the opening angle appeared normal in 60 degree range And the valve closed flat with limited AR on root injection and echo. This would argue against pannus ingrowth of sewing Ring. With subRx INR more likely issue would be thrombus affecting outlet strut. Also gradients may vary by echo Depending on wether CW catching mostly the minor or major outlet opening."  Also an area of dissection noted at the aortic root replacement. Plan was for him to be  on Plavix and coumadin for one month, then stop Plavix and resume his low dose aspirin. Needs TCTS referral due to increased velocity/gradient and need for repeat AVR is likely in the future. This referral has been delayed due to trying to get past medical records on the specifics of his past operation.   Comes back today. Here alone. He is doing ok. Says he is not short of breath. No more chest pain. Not dizzy. Has eaten some greens since he has been home - INR down to 1.9. He is ok on his medicines but has not had his Toprol since discharge - was not refilled ??  Restricting his salt. Weight stable at home. Overall, he feels good - he wishes to go on and see the surgeons if needed.    Current Outpatient Prescriptions  Medication Sig Dispense Refill  . acetaminophen (TYLENOL) 325 MG tablet Take 2 tablets (650 mg total) by mouth every 6 (six) hours as needed for moderate pain or headache.      . Cholecalciferol (VITAMIN D3) 1000 UNITS CAPS Take 1,000 Units by mouth daily.       . cloNIDine (CATAPRES) 0.1 MG tablet Take 0.1 mg by mouth 2 (two) times daily.      . clopidogrel (PLAVIX) 75 MG tablet Take 1 tablet (75 mg total) by mouth daily with breakfast.  30 tablet  0  . furosemide (LASIX) 40 MG tablet Take 1  tablet (40 mg total) by mouth 2 (two) times daily.  60 tablet  11  . glimepiride (AMARYL) 1 MG tablet Take 1 mg by mouth daily with breakfast.      . glimepiride (AMARYL) 1 MG tablet take 1 tablet by mouth once daily BEFORE BREAKFAST  90 tablet  3  . losartan (COZAAR) 100 MG tablet Take 100 mg by mouth daily.      . metFORMIN (GLUCOPHAGE) 1000 MG tablet Take 1,000 mg by mouth 2 (two) times daily with a meal.      . metoprolol (TOPROL-XL) 200 MG 24 hr tablet Take 200 mg by mouth daily.        . nitroGLYCERIN (NITROSTAT) 0.4 MG SL tablet Place 1 tablet (0.4 mg total) under the tongue every 5 (five) minutes x 3 doses as needed for chest pain.  25 tablet  2  . potassium chloride SA (K-DUR,KLOR-CON)  20 MEQ tablet Take 20 mEq by mouth 2 (two) times daily.      . pravastatin (PRAVACHOL) 40 MG tablet Take 40 mg by mouth daily.      [name] warfarin (COUMADIN) 5 MG tablet Take 5-10 mg by mouth daily. Takes 10mg  Thurs and Sun.  5mg  all other days       No current facility-administered medications for this visit.    Allergies  Allergen Reactions  . Insulin Glargine Swelling    edema    Past Medical History  Diagnosis Date  . Diabetes mellitus   . Hypertension   . Unspecified vitamin D deficiency   . Hyperlipidemia   . H/O cardiovascular stress test     a. Myoview 2011: walked for 89min, images with EF 39%, prominent inf scar, mild reversibility. b. Cardiac CT 06/2010: no CAD, only mild plaque in prox RCA. c. EF normal by echo 2014.  [name] hematuria   . TRANSIENT ISCHEMIC ATTACK, HX OF     a. In 2004.  [name] NEPHROLITHIASIS, HX OF   . PAF (paroxysmal atrial fibrillation)     a. H/o such, with recurrence of coarse afib/flutter during ER consult 03/2012.  . Ascending aortic dissection     a. 2000 - with aortic valve involvement. S/p repair of aortic dissection with placement of mechanical AVR.  [name] Obesity   . H/O mechanical aortic valve replacement     Past Surgical History  Procedure Laterality Date  . Appendectomy  1998  . Coronary artery bypass graft  2000  . Aortic valve replacement  2000  . Tee without cardioversion N/A 07/22/2013    Procedure: TRANSESOPHAGEAL ECHOCARDIOGRAM (TEE);  Surgeon: Zachary Hector, MD;  Location: [facility] ENDOSCOPY;  Service: Cardiovascular;  Laterality: N/A;    History  Smoking status  . Never Smoker   Smokeless tobacco  . Not on file    History  Alcohol Use No    Family History  Problem Relation Age of Onset  . Coronary artery disease Mother   . Hypertension Mother   . Diabetes Brother   . Coronary artery disease Other 13    male 1st degree relative, CABG in his 57's (father)    Review of Systems: The review of systems is per the HPI.  All  other systems were reviewed and are negative.  Physical Exam: Ht 6\' 2"  (1.88 m)  Wt 301 lb 6.4 oz (136.714 kg)  BMI 38.68 kg/m2 Patient is alert and in no acute distress. Somewhat monotone affect but pleasant. He is obese. Skin is warm and dry. Color  is normal.  HEENT is unremarkable. Normocephalic/atraumatic. PERRL. Sclera are nonicteric. Neck is supple. No masses. No JVD. Lungs are clear. Cardiac exam shows a regular rate and rhythm. Valve is crisp. Outflow murmur noted.  Abdomen is soft. Extremities are without edema. Gait and ROM are intact. No gross neurologic deficits noted.  Wt Readings from Last 3 Encounters:  07/31/13 301 lb 6.4 oz (136.714 kg)  07/25/13 299 lb 9.7 oz (135.9 kg)  07/25/13 299 lb 9.7 oz (135.9 kg)   LABORATORY DATA: PENDING   Lab Results  Component Value Date   WBC 5.9 07/25/2013   HGB 11.9* 07/25/2013   HCT 33.4* 07/25/2013   PLT 179 07/25/2013   GLUCOSE 125* 07/25/2013   CHOL 134 07/20/2013   TRIG 76 07/20/2013   HDL 36* 07/20/2013   LDLDIRECT 51.6 10/18/2011   LDLCALC 83 07/20/2013   ALT 20 07/19/2013   AST 26 07/19/2013   NA 140 07/25/2013   K 4.2 07/25/2013   CL 99 07/25/2013   CREATININE 1.21 07/25/2013   BUN 21 07/25/2013   CO2 25 07/25/2013   TSH 11.788* 07/21/2013   PSA 0.76 05/04/2012   INR 1.9 07/31/2013   HGBA1C 6.8* 03/24/2013   MICROALBUR 44.9* 05/09/2013    Lab Results  Component Value Date   INR 1.9 07/31/2013   INR 2.31* 07/25/2013   INR 2.28* 07/24/2013   PROTIME 15.9 11/06/2008   PROTIME 15.6 10/23/2008    TEE Study Conclusions  - Left ventricle: Systolic function was normal. The estimated ejection fraction was in the range of 50% to 55%. Diffuse hypokinesis. - Aortic valve: A Medtronic-Hall mechanical prosthesis was present. The prosthesis diskhad a normal range of motion. The sewing ring appeared normal. There was moderate to severe stenosis. Acoustic shadowing precludes estimation of pannus formation. Valve area: 0.96cm^2(VTI). Valve area: 0.96cm^2  (Vmax). Gradients are likely uderestimated due to limited ability for beam alignment from the esophagus. Higher gradients were recorded by transthoracic imaging. - Aorta: Segment of aortic root graft replacement (Bentall). There is a short segment of dissection in the ascending aorta, beyond the proximal graft anastomosis, ending before the arch. There is brisk flow in both the true and the false lumen. The overall aortic diameter is approximately 4 cm in this segment. - Mitral valve: No evidence of vegetation. Mild regurgitation. - Left atrium: The atrium was moderately dilated. No evidence of thrombus in the atrial cavity or appendage. No spontaneous echo contrast was observed. - Right ventricle: The cavity size was mildly dilated. Wall thickness was normal. - Right atrium: The atrium was dilated. - Atrial septum: No defect or patent foramen ovale was identified. Echo contrast study showed no right-to-left atrial level shunt, following an increase in RA pressure induced by provocative maneuvers. - Tricuspid valve: No evidence of vegetation. Moderate regurgitation directed centrally. - Pulmonic valve: No evidence of vegetation.   TTE Study Conclusions  - Left ventricle: The cavity size was mildly dilated. Wall thickness was increased in a pattern of moderate LVH. Systolic function was normal. The estimated ejection fraction was in the range of 55% to 60%. Wall motion was normal; there were no regional wall motion abnormalities. - Aortic valve: A mechanical prosthesis was present. There was moderate to severe stenosis. Trivial regurgitation. - Mitral valve: Mild regurgitation. - Left atrium: The atrium was moderately dilated. - Right ventricle: The cavity size was mildly dilated. - Right atrium: The atrium was mildly to moderately dilated. - Atrial septum: The septum bowed from left to  right, consistent with increased left atrial pressure. - Pulmonary arteries: Systolic  pressure was moderately increased. PA peak pressure: 34mm Hg (S).  PROCEDURE: Selective coronary angiography, ascending aortogram. Fluoroscopy of the prosthetic aortic valve. Bypass graft angiography. PCI of the SVG to RCA with distal embolic protection.  HEMODYNAMICS: Aortic pressure was 154/106.  ANGIOGRAPHIC DATA: The left main coronary artery is widely patent.  The left anterior descending artery is a large vessel proximally. The LAD appears intragraft be normal and reaches the apex. There is a large first diagonal which is widely patent.  The left circumflex artery is large vessel which appears widely patent. The first obtuse marginal is medium size and patent. Second and third obtuse marginals are large and widely patent..  The right coronary artery is occluded at the ostium.  The SVG to RCA has an ostial 90% stenosis with a 40 mm pressure gradient upon engagement of the catheter.   LEFT VENTRICULOGRAM: Left ventricular angiogram was not done due to the prosthetic aortic valve.   PCI NARRATIVE: Angiomax was used for anticoagulation. An AR-2 guiding catheter was used to engage the vein graft to the RCA. A pro-water wire was placed across the area disease in the ostial vein graft. A 6 mm spider embolic protection device was advanced to the mid graft. A 3.5 x 12 balloon was used to predilate the lesion. A 4.5 x 12 Rebel bare-metal stent was deployed. The stent was post dilated with a 5.0 x 8 noncompliant balloon. The pressure gradient was gone. It is difficult to get an angiographic result do to guide catheter issues. The balloons and stents appeared to fully inflate. Flow through the graft was much improved. Prior to the intervention, there is significant dye hang up in the vein graft. This was gone at the end of the procedure. The basket was successfully removed.   IMPRESSIONS:  1. No significant left sided coronary artery disease. 2. Occluded native right coronary artery. 90% ostial stenosis  in the SVG to RCA which was successfully treated with a 4.5 x 38mm rebel bare-metal stent, postdilated to 5.2 mm in diameter. 3. Fluoroscopy in multiple different views shows that the tilting disc aortic valve opens well.       4. Ascending aortic angiogram reveals a dilated root with some residual dissection in the arch likely just distal to the origin of the innominate.   RECOMMENDATION: He was loaded with Brilinta to get a quick antiplatelet effect. We will continue Angiomax for 3 hours post procedure. Long-term, he will get back on his Coumadin and will need Plavix for a month. During that time, I would hold off on aspirin. After a month, could consider stopping the Plavix and restarting aspirin based on what he was on in the past. Continuing Coumadin and Plavix would not be out of the question, depending on his bleeding risk.   CTA of the CHEST IMPRESSION: 1. Postoperative changes of a Zachary type A aortic dissection repair and aortic valve replacement with an intimal flap arising in the distal ascending aorta and extending to the origin of the left subclavian artery, stable. 2. Markedly enlarged pulmonary arteries, indicative of pulmonary arterial hypertension. 3. Cholelithiasis.   Electronically Signed By: Lorin Picket M.D. On: 07/24/2013 16:53   Assessment / Plan: 1. Chest pain/dsypnea - s/p cath, echo and TEE - felt to have moderate to severe stenosis of the mechanical AVR and now s/p PCI to the SVT to the RCA - on Plavix for at least one month.  To be referred to TCTS as well. He is doing well clinically. Will recheck his labs today.   2. Prior replacement of the aortic root, ascending aorta and 25 mm Medtronic Hall AVR - concern for valve dysfunction and residual dissection in the arch - to refer on to TCTS for evaluation today.   3. Diastolic dysfunction - looks compensated  4. HTN - BP ok - will restart his Toprol 1/2 dose for 3 days and then back to his full dose  5.  Chronic coumadin - To recheck his INR in one week, not 3 weeks.   Patient is agreeable to this plan and will call if any problems develop in the interim.   Burtis Junes, RN, [facility] [address] [place], [place]  [id number] [phone]

## 2013-08-01 ENCOUNTER — Other Ambulatory Visit (HOSPITAL_COMMUNITY): Payer: Medicare Other

## 2013-08-02 ENCOUNTER — Telehealth: Payer: Self-pay | Admitting: Nurse Practitioner

## 2013-08-02 NOTE — Telephone Encounter (Signed)
New Message:  Pt is requesting a call back to hear his lab results.

## 2013-08-07 ENCOUNTER — Institutional Professional Consult (permissible substitution) (INDEPENDENT_AMBULATORY_CARE_PROVIDER_SITE_OTHER): Payer: Medicare Other | Admitting: Surgery

## 2013-08-07 ENCOUNTER — Ambulatory Visit (INDEPENDENT_AMBULATORY_CARE_PROVIDER_SITE_OTHER): Payer: Medicare Other | Admitting: *Deleted

## 2013-08-07 ENCOUNTER — Encounter: Payer: Self-pay | Admitting: Surgery

## 2013-08-07 VITALS — BP 114/72 | HR 67 | Resp 20 | Ht 74.0 in | Wt 294.0 lb

## 2013-08-07 DIAGNOSIS — Z954 Presence of other heart-valve replacement: Secondary | ICD-10-CM | POA: Diagnosis not present

## 2013-08-07 DIAGNOSIS — Z5181 Encounter for therapeutic drug level monitoring: Secondary | ICD-10-CM | POA: Insufficient documentation

## 2013-08-07 DIAGNOSIS — I359 Nonrheumatic aortic valve disorder, unspecified: Secondary | ICD-10-CM

## 2013-08-07 DIAGNOSIS — I5032 Chronic diastolic (congestive) heart failure: Secondary | ICD-10-CM

## 2013-08-07 DIAGNOSIS — I350 Nonrheumatic aortic (valve) stenosis: Secondary | ICD-10-CM

## 2013-08-07 DIAGNOSIS — I4891 Unspecified atrial fibrillation: Secondary | ICD-10-CM | POA: Diagnosis not present

## 2013-08-07 DIAGNOSIS — Z8679 Personal history of other diseases of the circulatory system: Secondary | ICD-10-CM

## 2013-08-07 DIAGNOSIS — Z952 Presence of prosthetic heart valve: Secondary | ICD-10-CM

## 2013-08-07 DIAGNOSIS — I480 Paroxysmal atrial fibrillation: Secondary | ICD-10-CM

## 2013-08-07 LAB — POCT INR: INR: 1.4

## 2013-08-08 ENCOUNTER — Encounter: Payer: Self-pay | Admitting: Surgery

## 2013-08-08 NOTE — Progress Notes (Signed)
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 using diagrams. He seems to understand.  Plan:  He will continue to follow up with Dr. Marlou Porch. I will discuss this case with Dr. Marlou Porch and will see the patient back as needed.

## 2013-08-14 ENCOUNTER — Ambulatory Visit (INDEPENDENT_AMBULATORY_CARE_PROVIDER_SITE_OTHER): Payer: Medicare Other | Admitting: Nurse Practitioner

## 2013-08-14 ENCOUNTER — Ambulatory Visit (INDEPENDENT_AMBULATORY_CARE_PROVIDER_SITE_OTHER): Payer: Medicare Other | Admitting: *Deleted

## 2013-08-14 ENCOUNTER — Encounter: Payer: Self-pay | Admitting: Nurse Practitioner

## 2013-08-14 VITALS — BP 110/74 | HR 60 | Ht 74.0 in | Wt 304.8 lb

## 2013-08-14 DIAGNOSIS — Z9861 Coronary angioplasty status: Secondary | ICD-10-CM

## 2013-08-14 DIAGNOSIS — Z955 Presence of coronary angioplasty implant and graft: Secondary | ICD-10-CM

## 2013-08-14 DIAGNOSIS — I10 Essential (primary) hypertension: Secondary | ICD-10-CM

## 2013-08-14 DIAGNOSIS — I200 Unstable angina: Secondary | ICD-10-CM

## 2013-08-14 DIAGNOSIS — I4891 Unspecified atrial fibrillation: Secondary | ICD-10-CM

## 2013-08-14 DIAGNOSIS — Z5181 Encounter for therapeutic drug level monitoring: Secondary | ICD-10-CM

## 2013-08-14 DIAGNOSIS — I5032 Chronic diastolic (congestive) heart failure: Secondary | ICD-10-CM

## 2013-08-14 DIAGNOSIS — I480 Paroxysmal atrial fibrillation: Secondary | ICD-10-CM

## 2013-08-14 DIAGNOSIS — Z8679 Personal history of other diseases of the circulatory system: Secondary | ICD-10-CM

## 2013-08-14 LAB — POCT INR: INR: 1.7

## 2013-08-14 NOTE — Patient Instructions (Addendum)
Ok to resume your exercise program - start back slow and ease back into your routine over the next 4 to 6 weeks - if you feel bad, have symptoms, etc - let us know  See Dr. Stanford Breed in 2 months  Recheck INR today  Stay on your current medicines but stop Plavix after March 27th - Friday - start Baby aspirin 81 mg along with your coumadin  Call the [place] office at [phone] if you have any questions, problems or concerns.

## 2013-08-14 NOTE — Progress Notes (Signed)
[name] Date of Birth: [birth year] Medical Record #[id number]  History of Present Illness: [name] is seen back today for a follow upl visit. Seen for Dr. Stanford Breed. 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 Edward Qualia in October of 2000. He is on chronic coumadin with a goal of 2 to 3 noted.   He had a Myoview in 2011 showing prominent inferior scar with very mild reversibility. Cardiac CT in 06/2010 showed only mild plaque in the RCA. Last seen here in November of 2014 for chest pain that was deemed musculoskeletal.   Most recently admitted with chest pain and dyspnea. INR was subtherapeutic. He was cathed - showing a 90% proximal SVT to RCA stenosis - this was intervened on with a BMS. Echo showed moderate to severe stenosis of the mechanical Medtronic Hall AVR. Dr. Aundra Dubin has noted that "the mean gradient across the valve on recent echo was 36 mmHg, this is higher than prior and concerning as INR was therapeutic at admission. However, fluoroscopy of valve showed normal mechanical valve function, doubt partial thrombosis, etc."  Dr. Johnsie Cancel has noted that "Review of echos as far back as 2007 show persistant Velocities by CW in 30m/sec range. In 2012 was as high as 3.75m/sec. Any velocity over 87m/sec is abnormal no matter What valve or size. Valve placed in [place] ? 2000 On fluro the opening angle appeared normal in 60 degree range And the valve closed flat with limited AR on root injection and echo. This would argue against pannus ingrowth of sewing Ring. With subRx INR more likely issue would be thrombus affecting outlet strut. Also gradients may vary by echo Depending on wether CW catching mostly the minor or major outlet opening."   Also an area of dissection noted at the aortic root replacement. Plan was for him to be on  Plavix and coumadin for one month, then stop Plavix and resume his low dose aspirin. Needed TCT referral - sent to Dr. Cyndia Bent after his records were obtained - seen last week. His recommendations are as follows:   Impression:  He has 25 mm Medtronic Hall mechanical valve which traditionally has very good hemodynamics similar to bileaflet mechanical valves. His valve has always had an elevated gradient by echo since it has been followed here since 2007. This gradient has varied up and down and last year the mean gradient was 21 and the peak was 38 compared to 36 and 63 respectively now. The gradient by TEE was lower than by 2D. This speaks to the relative inaccurateness of echo gradients for mechanical prosthetic valves. His BSA is 2.6-2.7 depending on his weight so under a best case scenario a 25 mm mechanical valve is going to have a moderate patient-prosthesis mismatch. This can't be helped since the 25 mm valve is the largest that could be inserted in his annulus. He is a very large man with a high cardiac output so I would expect a relatively high gradient with this valve. His disc is moving normally and closing fully with no significant regurgitation so I doubt that there is thrombus or pannus causing a problem. If we reoperated on him he would not get a larger mechanical valve and would likely be unchanged but the operative risk for a redo Bentall  is high. If we used a homograft or a porcine root he would have a lower gradient or no gradient but the lifespan of these prostheses in a 48 year old is probably 10 years at the most and would subject him to a 3 rd redo Bentall with extremely high risk. He has been very active until the past few months with no symptoms and I think it is highly likely that all of his recent symptoms were related to the vein graft stenosis. I think his activity should be unrestricted when cardiology feels he is adequately recovered from his stent. If he continues to have symptoms of  exertional intolerance then it may be best to accurately measure the gradient across the prosthetic valve using a pressure wire to simultaneously measure the LV and aortic pressure.  He also has a limited residual dissection in the aortic arch from the initial dissection. The dissection plane was sealed at the distal anastomosis but there was probably a reentry tear at the distal arch and retrograde flow back to the distal anastomosis. The aorta is mildly dilated at this region and does not need any treatment at this time. It should be followed with periodic CTA to assess for aneurysmal enlargement of the arch.  I reviewed his CT scan and echos with him and my recommendations for continued follow-up. I spent several hours reviewing all of his prior records and studies and 90 minutes face to face discussing all of this with him using diagrams. He seems to understand.  Plan:  He will continue to follow up with Dr. Marlou Porch. I will discuss this case with Dr. Marlou Porch and will see the patient back as needed.      Comes back today. Here alone. He is doing very well. Weight is stable at home. BP is good. He feels good. No chest pain. Not short of breath. Anxious to get back to the gym. He had a pretty rigorous exercise routine prior to this event in February. He has been walking without any issue. Not dizzy or lightheaded. Needs coumadin checked. Only has a few more days of his Plavix and then will switch to just aspirin and Coumadin. PCP is adjusting his thyroid medicine.   Current Outpatient Prescriptions  Medication Sig Dispense Refill  . acetaminophen (TYLENOL) 325 MG tablet Take 2 tablets (650 mg total) by mouth every 6 (six) hours as needed for moderate pain or headache.      . Cholecalciferol (VITAMIN D3) 1000 UNITS CAPS Take 1,000 Units by mouth daily.       . cloNIDine (CATAPRES) 0.1 MG tablet Take 0.1 mg by mouth 2 (two) times daily.      . clopidogrel (PLAVIX) 75 MG tablet Take 1 tablet (75 mg total)  by mouth daily with breakfast.  30 tablet  0  . furosemide (LASIX) 40 MG tablet Take 1 tablet (40 mg total) by mouth 2 (two) times daily.  60 tablet  11  . glimepiride (AMARYL) 1 MG tablet take 1 tablet by mouth once daily BEFORE BREAKFAST  90 tablet  3  . losartan (COZAAR) 100 MG tablet Take 100 mg by mouth daily.      . metFORMIN (GLUCOPHAGE) 1000 MG tablet Take 1,000 mg by mouth 2 (two) times daily with a meal.      . metoprolol (TOPROL-XL) 200 MG 24 hr tablet Take 1 tablet (200 mg total) by mouth daily.  60 tablet  6  . nitroGLYCERIN (NITROSTAT) 0.4 MG SL tablet Place 1  tablet (0.4 mg total) under the tongue every 5 (five) minutes x 3 doses as needed for chest pain.  25 tablet  2  . potassium chloride SA (K-DUR,KLOR-CON) 20 MEQ tablet Take 20 mEq by mouth 2 (two) times daily.      . pravastatin (PRAVACHOL) 40 MG tablet Take 40 mg by mouth daily.      [name] warfarin (COUMADIN) 5 MG tablet Take 5-10 mg by mouth daily. Takes 10mg  Thurs and Sun.  5mg  all other days       No current facility-administered medications for this visit.    Allergies  Allergen Reactions  . Insulin Glargine Swelling    edema    Past Medical History  Diagnosis Date  . Diabetes mellitus   . Hypertension   . Unspecified vitamin D deficiency   . Hyperlipidemia   . H/O cardiovascular stress test     a. Myoview 2011: walked for 26min, images with EF 39%, prominent inf scar, mild reversibility. b. Cardiac CT 06/2010: no CAD, only mild plaque in prox RCA. c. EF normal by echo 2014.  [name] hematuria   . TRANSIENT ISCHEMIC ATTACK, HX OF     a. In 2004.  [name] NEPHROLITHIASIS, HX OF   . PAF (paroxysmal atrial fibrillation)     a. H/o such, with recurrence of coarse afib/flutter during ER consult 03/2012.  . Ascending aortic dissection     a. 2000 - with aortic valve involvement. S/p repair of aortic dissection with placement of mechanical AVR.  [name] Obesity   . H/O mechanical aortic valve replacement     Past Surgical  History  Procedure Laterality Date  . Appendectomy  1998  . Coronary artery bypass graft  2000  . Aortic valve replacement  2000  . Tee without cardioversion N/A 07/22/2013    Procedure: TRANSESOPHAGEAL ECHOCARDIOGRAM (TEE);  Surgeon: [name], MD;  Location: [facility] END[facility];  Service: Cardiovascular;  Laterality: N/A;    History  Smoking status  . Never Smoker   Smokeless tobacco  . Not on file    History  Alcohol Use No    Family History  Problem Relation Age of Onset  . Coronary artery disease Mother   . Hypertension Mother   . Diabetes Brother   . Coronary artery disease Other 52    male 1st degree relative, CABG in his 61's (father)    Review of Systems: The review of systems is per the HPI.  All other systems were reviewed and are negative.  Physical Exam: BP 110/74  Pulse 60  Ht 6\' 2"  (1.88 m)  Wt 304 lb 12.8 oz (138.256 kg)  BMI 39.12 kg/m2 Patient is very pleasant and in no acute distress. He is of large stature.  Skin is warm and dry. Color is normal.  HEENT is unremarkable. Normocephalic/atraumatic. PERRL. Sclera are nonicteric. Neck is supple. No masses. No JVD. Lungs are clear. Cardiac exam shows a regular rate and rhythm. Valve is crisp. Soft outflow murmur noted. Abdomen is soft. Extremities are without edema. Gait and ROM are intact. No gross neurologic deficits noted.  Wt Readings from Last 3 Encounters:  08/14/13 304 lb 12.8 oz (138.256 kg)  08/07/13 294 lb (133.358 kg)  07/31/13 301 lb 6.4 oz (136.714 kg)     LABORATORY DATA: Lab Results  Component Value Date   WBC 6.0 07/31/2013   HGB 12.9* 07/31/2013   HCT 38.0* 07/31/2013   PLT 212.0 07/31/2013   GLUCOSE 138* 07/31/2013   CHOL  134 07/20/2013   TRIG 76 07/20/2013   HDL 36* 07/20/2013   LDLDIRECT 51.6 10/18/2011   LDLCALC 83 07/20/2013   ALT 20 07/19/2013   AST 26 07/19/2013   NA 136 07/31/2013   K 3.7 07/31/2013   CL 104 07/31/2013   CREATININE 1.2 07/31/2013   BUN 22 07/31/2013   CO2 25  07/31/2013   TSH 11.788* 07/21/2013   PSA 0.76 05/04/2012   INR 1.4 08/07/2013   HGBA1C 6.8* 03/24/2013   MICROALBUR 44.9* 05/09/2013   Lab Results  Component Value Date   INR 1.4 08/07/2013   INR 1.9 07/31/2013   INR 2.31* 07/25/2013   PROTIME 15.9 11/06/2008   PROTIME 15.6 10/23/2008     Assessment / Plan: 1. Chest pain/dsypnea - s/p cath, echo and TEE - s/p PCI to the SVT to the RCA - on Plavix for at least one month - almost finished. Will be stopping later this week and going back to aspirin 81 mg with his coumadin. Ok to start getting back into his exercise routine - he knows to start out slow and work his way back up but with the expectation that if he cannot get back to all of his usual activities due to symptoms - he will need further testing.   2. Prior replacement of the aortic root, ascending aorta and 25 mm Medtronic Hall AVR   3. Diastolic dysfunction - looks compensated - he is doing well.   4. HTN - stable on his current regimen.   5. Chronic coumadin - rechecking today.   See Dr. Stanford Breed back in 2 months. He is doing quite well.   Patient is agreeable to this plan and will call if any problems develop in the interim.   Burtis Junes, RN, [facility]  [address] [place]  [place], [place] [address]  [phone]

## 2013-08-15 NOTE — Telephone Encounter (Signed)
New Prob    Has some questions regarding Coumadin dosage. Please call.

## 2013-08-15 NOTE — Telephone Encounter (Signed)
Spoke with pt about his coumadin dosage, he wanted to inform us that he has 5mg s tablets at home not 10mg s, Anticoag track changed to reflect correct tablet strength.

## 2013-08-16 ENCOUNTER — Telehealth (HOSPITAL_COMMUNITY): Payer: Self-pay | Admitting: *Deleted

## 2013-08-16 NOTE — Telephone Encounter (Signed)
Message copied by Rowe Pavy on Fri Aug 16, 2013  9:07 AM ------      Message from: Burtis Junes      Created: Fri Aug 16, 2013  7:34 AM      Regarding: RE: ok to proceed with Cardiac Rehab       He may proceed if he wishes - had a very good exercise routine prior to this.            Normal precautions advised.            Cecille Rubin      ----- Message -----         From: Rowe Pavy, RN         Sent: 08/15/2013   1:44 PM           To: Burtis Junes, NP      Subject: ok to proceed with Cardiac Rehab                         Hi Cecille Rubin,            We received a referral to cardiac rehab for this pt s/p BMS to SVG/RCA 07/19/13.  Reviewed  medical record - ? Need for redo AVR.  Reviewed office notes from the surgeon consult and your follow up visits.  Based upon your assessment, may pt proceed with Cardiac Rehab? If so any restrictions for exercise and/or BP?            Thanks for your input            [facility] RN       ------

## 2013-08-21 ENCOUNTER — Other Ambulatory Visit: Payer: Self-pay | Admitting: Cardiology

## 2013-08-22 ENCOUNTER — Ambulatory Visit (INDEPENDENT_AMBULATORY_CARE_PROVIDER_SITE_OTHER): Payer: Medicare Other | Admitting: Internal Medicine

## 2013-08-22 ENCOUNTER — Other Ambulatory Visit (INDEPENDENT_AMBULATORY_CARE_PROVIDER_SITE_OTHER): Payer: Medicare Other

## 2013-08-22 ENCOUNTER — Encounter: Payer: Self-pay | Admitting: Internal Medicine

## 2013-08-22 ENCOUNTER — Telehealth: Payer: Self-pay | Admitting: Cardiology

## 2013-08-22 VITALS — BP 130/80 | HR 58 | Temp 97.4°F | Wt 307.5 lb

## 2013-08-22 DIAGNOSIS — E039 Hypothyroidism, unspecified: Secondary | ICD-10-CM

## 2013-08-22 DIAGNOSIS — E119 Type 2 diabetes mellitus without complications: Secondary | ICD-10-CM | POA: Diagnosis not present

## 2013-08-22 DIAGNOSIS — I10 Essential (primary) hypertension: Secondary | ICD-10-CM | POA: Diagnosis not present

## 2013-08-22 DIAGNOSIS — E785 Hyperlipidemia, unspecified: Secondary | ICD-10-CM | POA: Diagnosis not present

## 2013-08-22 DIAGNOSIS — Z125 Encounter for screening for malignant neoplasm of prostate: Secondary | ICD-10-CM

## 2013-08-22 DIAGNOSIS — N320 Bladder-neck obstruction: Secondary | ICD-10-CM | POA: Diagnosis not present

## 2013-08-22 DIAGNOSIS — Z23 Encounter for immunization: Secondary | ICD-10-CM

## 2013-08-22 DIAGNOSIS — K8020 Calculus of gallbladder without cholecystitis without obstruction: Secondary | ICD-10-CM

## 2013-08-22 DIAGNOSIS — I200 Unstable angina: Secondary | ICD-10-CM

## 2013-08-22 HISTORY — DX: Calculus of gallbladder without cholecystitis without obstruction: K80.20

## 2013-08-22 LAB — LIPID PANEL
CHOL/HDL RATIO: 4
CHOLESTEROL: 123 mg/dL (ref 0–200)
HDL: 28 mg/dL — AB (ref >39.00)
LDL CALC: 38 mg/dL (ref 0–99)
TRIGLYCERIDES: 287 mg/dL — AB (ref 0.0–149.0)
VLDL: 57.4 mg/dL — AB (ref 0.0–40.0)

## 2013-08-22 LAB — HEPATIC FUNCTION PANEL
ALT: 17 U/L (ref 0–53)
AST: 20 U/L (ref 0–37)
Albumin: 3.8 g/dL (ref 3.5–5.2)
Alkaline Phosphatase: 54 U/L (ref 39–117)
BILIRUBIN DIRECT: 0.1 mg/dL (ref 0.0–0.3)
Total Bilirubin: 0.9 mg/dL (ref 0.3–1.2)
Total Protein: 7.5 g/dL (ref 6.0–8.3)

## 2013-08-22 LAB — BASIC METABOLIC PANEL
BUN: 18 mg/dL (ref 6–23)
CHLORIDE: 100 meq/L (ref 96–112)
CO2: 29 mEq/L (ref 19–32)
CREATININE: 1.2 mg/dL (ref 0.4–1.5)
Calcium: 9 mg/dL (ref 8.4–10.5)
GFR: 81.35 mL/min (ref >60.00)
Glucose, Bld: 118 mg/dL — ABNORMAL HIGH (ref 70–99)
Potassium: 4.1 mEq/L (ref 3.5–5.1)
Sodium: 137 mEq/L (ref 135–145)

## 2013-08-22 LAB — HEMOGLOBIN A1C: Hgb A1c MFr Bld: 6.3 % (ref 4.6–6.5)

## 2013-08-22 LAB — PSA: PSA: 0.55 ng/mL (ref 0.10–4.00)

## 2013-08-22 MED ORDER — GLUCOSE BLOOD VI STRP: ORAL_STRIP | Status: DC

## 2013-08-22 MED ORDER — LEVOTHYROXINE SODIUM 50 MCG PO TABS: 50.0000 ug | ORAL_TABLET | Freq: Every day | ORAL | Status: DC

## 2013-08-22 MED ORDER — LANCETS MISC: Status: DC

## 2013-08-22 NOTE — Assessment & Plan Note (Signed)
stable overall by history and exam, recent data reviewed with pt, and pt to continue medical treatment as before,  to f/u any worsening symptoms or concerns BP Readings from Last 3 Encounters:  08/22/13 130/80  08/14/13 110/74  08/07/13 114/72    

## 2013-08-22 NOTE — Addendum Note (Signed)
Addended by: Sharon Seller B on: 08/22/2013 09:48 AM   Modules accepted: Orders

## 2013-08-22 NOTE — Telephone Encounter (Signed)
Spoke with [name], he was seen by dr Jenny Reichmann this morning and his thyroid functions is abnormal. He was started on new med. Dr Jenny Reichmann felt that the weight gain is due to being less active and the thyroid. The [name] will cont to monitor and follow up with dr Jenny Reichmann regarding future blood work. Explained did not feel was related to the heart. [name] voiced understanding

## 2013-08-22 NOTE — Assessment & Plan Note (Signed)
Ok for start low dose thyroid med, avoid higher dosing due to hx of angina, plan to repeat labs next visit

## 2013-08-22 NOTE — Addendum Note (Signed)
Addended by: Biagio Borg on: 08/22/2013 09:35 AM   Modules accepted: Orders

## 2013-08-22 NOTE — Assessment & Plan Note (Signed)
Also for psa as he is due 

## 2013-08-22 NOTE — Progress Notes (Signed)
Pre visit review using our clinic review tool, if applicable. No additional management support is needed unless otherwise documented below in the visit note. 

## 2013-08-22 NOTE — Progress Notes (Signed)
Subjective:    Patient ID: [name], male    DOB: June 30, 1965, 48 y.o.   MRN: [id number]  HPI  Here to f/u; has gained several lbs with less activity, also TSH has been increased recently as well, though free t4 still WNL.  Pt denies chest pain, increased sob or doe, wheezing, orthopnea, PND, increased LE swelling, palpitations, dizziness or syncope.  Pt denies new neurological symptoms such as new headache, or facial or extremity weakness or numbness   Pt denies polydipsia, polyuria, or low sugar symptoms such as weakness or confusion improved with po intake.  Pt states overall good compliance with meds, trying to follow lower cholesterol, diabetic diet, wt overall stable but little exercise however.    Past Medical History  Diagnosis Date  . Diabetes mellitus   . Hypertension   . Unspecified vitamin D deficiency   . Hyperlipidemia   . H/O cardiovascular stress test     a. Myoview 2011: walked for 80min, images with EF 39%, prominent inf scar, mild reversibility. b. Cardiac CT 06/2010: no CAD, only mild plaque in prox RCA. c. EF normal by echo 2014.  [name] hematuria   . TRANSIENT ISCHEMIC ATTACK, HX OF     a. In 2004.  [name] NEPHROLITHIASIS, HX OF   . PAF (paroxysmal atrial fibrillation)     a. H/o such, with recurrence of coarse afib/flutter during ER consult 03/2012.  . Ascending aortic dissection     a. 2000 - with aortic valve involvement. S/p repair of aortic dissection with placement of mechanical AVR.  [name] Obesity   . H/O mechanical aortic valve replacement   . Cholelithiasis 08/22/2013   Past Surgical History  Procedure Laterality Date  . Appendectomy  1998  . Coronary artery bypass graft  2000  . Aortic valve replacement  2000  . Tee without cardioversion N/A 07/22/2013    Procedure: TRANSESOPHAGEAL ECHOCARDIOGRAM (TEE);  Surgeon: [name], MD;  Location: [facility] ENDOSCOPY;  Service: Cardiovascular;  Laterality: N/A;    reports that he has never smoked. He does not have any  smokeless tobacco history on file. He reports that he does not drink alcohol or use illicit drugs. family history includes Coronary artery disease in his mother; Coronary artery disease (age of onset: 55) in his other; Diabetes in his brother; Hypertension in his mother. Allergies  Allergen Reactions  . Insulin Glargine Swelling    edema   Current Outpatient Prescriptions on File Prior to Visit  Medication Sig Dispense Refill  . Cholecalciferol (VITAMIN D3) 1000 UNITS CAPS Take 1,000 Units by mouth daily.       . cloNIDine (CATAPRES) 0.1 MG tablet take 1 tablet by mouth twice a day NEED OFFICE VISIT FOR REFILLS  60 tablet  1  . furosemide (LASIX) 40 MG tablet Take 1 tablet (40 mg total) by mouth 2 (two) times daily.  60 tablet  11  . glimepiride (AMARYL) 1 MG tablet take 1 tablet by mouth once daily BEFORE BREAKFAST  90 tablet  3  . losartan (COZAAR) 100 MG tablet Take 100 mg by mouth daily.      . metFORMIN (GLUCOPHAGE) 1000 MG tablet Take 1,000 mg by mouth 2 (two) times daily with a meal.      . metoprolol (TOPROL-XL) 200 MG 24 hr tablet Take 1 tablet (200 mg total) by mouth daily.  60 tablet  6  . nitroGLYCERIN (NITROSTAT) 0.4 MG SL tablet Place 1 tablet (0.4 mg total) under the tongue  every 5 (five) minutes x 3 doses as needed for chest pain.  25 tablet  2  . potassium chloride SA (K-DUR,KLOR-CON) 20 MEQ tablet Take 20 mEq by mouth 2 (two) times daily.      . pravastatin (PRAVACHOL) 40 MG tablet Take 40 mg by mouth daily.      [name] warfarin (COUMADIN) 5 MG tablet Take 5-10 mg by mouth daily. Takes 10mg  Thurs and Sun.  5mg  all other days      . acetaminophen (TYLENOL) 325 MG tablet Take 2 tablets (650 mg total) by mouth every 6 (six) hours as needed for moderate pain or headache.      . clopidogrel (PLAVIX) 75 MG tablet Take 1 tablet (75 mg total) by mouth daily with breakfast.  30 tablet  0   No current facility-administered medications on file prior to visit.    Review of Systems   Constitutional: Negative for unexpected weight change, or unusual diaphoresis  HENT: Negative for tinnitus.   Eyes: Negative for photophobia and visual disturbance.  Respiratory: Negative for choking and stridor.   Gastrointestinal: Negative for vomiting and blood in stool.  Genitourinary: Negative for hematuria and decreased urine volume.  Musculoskeletal: Negative for acute joint swelling Skin: Negative for color change and wound.  Neurological: Negative for tremors and numbness other than noted  Psychiatric/Behavioral: Negative for decreased concentration or  hyperactivity.       Objective:   Physical Exam BP 130/80  Pulse 58  Temp(Src) 97.4 F (36.3 C) (Oral)  Wt 307 lb 8 oz (139.481 kg)  SpO2 98% VS noted,  Constitutional: Pt appears well-developed and well-nourished.  HENT: Head: NCAT.  Right Ear: External ear normal.  Left Ear: External ear normal.  Eyes: Conjunctivae and EOM are normal. Pupils are equal, round, and reactive to light.  Neck: Normal range of motion. Neck supple.  Cardiovascular: Normal rate and regular rhythm.  with aortic valve click Pulmonary/Chest: Effort normal and breath sounds normal.  Abd:  Soft, NT, non-distended, + BS Neurological: Pt is alert. Not confused  Skin: Skin is warm. No erythema.  Psychiatric: Pt behavior is normal. Thought content normal.      Assessment & Plan:

## 2013-08-22 NOTE — Patient Instructions (Addendum)
You had the Td (tetanus shot today)  Please take all new medication as prescribed - the lower dose thyroid medication  Please continue all other medications as before, and refills have been done if requested. Please have the pharmacy call with any other refills you may need.  Please continue your efforts at being more active, low cholesterol diet, and weight control.  Please keep your appointments with your specialists as you have planned  Please go to the LAB in the Basement (turn left off the elevator) for the tests to be done today You will be contacted by phone if any changes need to be made immediately.  Otherwise, you will receive a letter about your results with an explanation, but please check with MyChart first.  Please remember to sign up for MyChart if you have not done so, as this will be important to you in the future with finding out test results, communicating by private email, and scheduling acute appointments online when needed.  Please return in 6 months, or sooner if needed

## 2013-08-22 NOTE — Assessment & Plan Note (Signed)
stable overall by history and exam, recent data reviewed with pt, and pt to continue medical treatment as before,  to f/u any worsening symptoms or concerns Lab Results  Component Value Date   HGBA1C 6.8* 03/24/2013

## 2013-08-22 NOTE — Assessment & Plan Note (Signed)
stable overall by history and exam, recent data reviewed with pt, and pt to continue medical treatment as before,  to f/u any worsening symptoms or concerns BP Readings from Last 3 Encounters:  08/22/13 130/80  08/14/13 110/74  08/07/13 114/72

## 2013-08-22 NOTE — Telephone Encounter (Signed)
New message     Patient c/o wt gain.  He said he has no idea why he is gaining weight.  No ankle/leg edema and no sob.  Please advise

## 2013-08-23 ENCOUNTER — Ambulatory Visit (INDEPENDENT_AMBULATORY_CARE_PROVIDER_SITE_OTHER): Payer: Medicare Other | Admitting: *Deleted

## 2013-08-23 DIAGNOSIS — I4891 Unspecified atrial fibrillation: Secondary | ICD-10-CM | POA: Diagnosis not present

## 2013-08-23 DIAGNOSIS — Z8679 Personal history of other diseases of the circulatory system: Secondary | ICD-10-CM

## 2013-08-23 DIAGNOSIS — I480 Paroxysmal atrial fibrillation: Secondary | ICD-10-CM

## 2013-08-23 DIAGNOSIS — Z5181 Encounter for therapeutic drug level monitoring: Secondary | ICD-10-CM | POA: Diagnosis not present

## 2013-08-23 LAB — POCT INR: INR: 3.7

## 2013-08-26 ENCOUNTER — Telehealth: Payer: Self-pay | Admitting: Internal Medicine

## 2013-08-26 NOTE — Telephone Encounter (Signed)
Relevant patient education assigned to patient using Emmi. ° °

## 2013-08-29 ENCOUNTER — Telehealth: Payer: Self-pay | Admitting: Internal Medicine

## 2013-08-29 MED ORDER — GLUCOSE BLOOD VI STRP: ORAL_STRIP | Status: DC

## 2013-08-29 NOTE — Telephone Encounter (Signed)
[name] to contact pt; received request from pharmacy to clarify his insulin prescription, but I see no insulin rx on his med list

## 2013-08-29 NOTE — Telephone Encounter (Signed)
°  Patient states that he has not taken insulin in 2 years, however they may need clarification on his glucose blood test strips because still has not received them. Please advise.   Closed phn encounter below:  Robin B Ewing at 08/29/2013 8:09 AM    Status: Signed       Called cell number at [phone] left detailed message to call back with information request.       Biagio Borg, MD at 08/29/2013 7:13 AM     Status: Signed        Shirlean Mylar to contact pt; received request from pharmacy to clarify his insulin prescription, but I see no insulin rx on his med list

## 2013-08-29 NOTE — Telephone Encounter (Signed)
Received message and sent in strips, including diagnosis code and how often to check sugar daily.  Called the patient left a detailed message strips sent in.

## 2013-08-29 NOTE — Telephone Encounter (Signed)
Called cell number at [phone] left detailed message to call back with information request.

## 2013-08-30 ENCOUNTER — Ambulatory Visit (INDEPENDENT_AMBULATORY_CARE_PROVIDER_SITE_OTHER): Payer: Medicare Other | Admitting: *Deleted

## 2013-08-30 ENCOUNTER — Other Ambulatory Visit: Payer: Self-pay | Admitting: Internal Medicine

## 2013-08-30 DIAGNOSIS — I480 Paroxysmal atrial fibrillation: Secondary | ICD-10-CM

## 2013-08-30 DIAGNOSIS — Z5181 Encounter for therapeutic drug level monitoring: Secondary | ICD-10-CM | POA: Diagnosis not present

## 2013-08-30 DIAGNOSIS — Z8679 Personal history of other diseases of the circulatory system: Secondary | ICD-10-CM | POA: Diagnosis not present

## 2013-08-30 DIAGNOSIS — I4891 Unspecified atrial fibrillation: Secondary | ICD-10-CM | POA: Diagnosis not present

## 2013-08-30 LAB — POCT INR: INR: 2.7

## 2013-08-30 MED ORDER — GLUCOSE BLOOD VI STRP: ORAL_STRIP | Status: DC

## 2013-09-13 ENCOUNTER — Ambulatory Visit (INDEPENDENT_AMBULATORY_CARE_PROVIDER_SITE_OTHER): Payer: Medicare Other

## 2013-09-13 DIAGNOSIS — G459 Transient cerebral ischemic attack, unspecified: Secondary | ICD-10-CM | POA: Diagnosis not present

## 2013-09-13 DIAGNOSIS — Z8679 Personal history of other diseases of the circulatory system: Secondary | ICD-10-CM | POA: Diagnosis not present

## 2013-09-13 DIAGNOSIS — I359 Nonrheumatic aortic valve disorder, unspecified: Secondary | ICD-10-CM

## 2013-09-13 DIAGNOSIS — Z5181 Encounter for therapeutic drug level monitoring: Secondary | ICD-10-CM

## 2013-09-13 DIAGNOSIS — I4891 Unspecified atrial fibrillation: Secondary | ICD-10-CM | POA: Diagnosis not present

## 2013-09-13 LAB — POCT INR: INR: 4.6

## 2013-09-14 ENCOUNTER — Encounter (HOSPITAL_COMMUNITY): Payer: Self-pay | Admitting: Emergency Medicine

## 2013-09-14 ENCOUNTER — Emergency Department (HOSPITAL_COMMUNITY)
Admission: EM | Admit: 2013-09-14 | Discharge: 2013-09-14 | Disposition: A | Discharge status: Home / Self Care | Payer: Medicare Other | Attending: Emergency Medicine | Admitting: Emergency Medicine

## 2013-09-14 ENCOUNTER — Emergency Department (HOSPITAL_COMMUNITY): Payer: Medicare Other

## 2013-09-14 DIAGNOSIS — Z8719 Personal history of other diseases of the digestive system: Secondary | ICD-10-CM | POA: Diagnosis not present

## 2013-09-14 DIAGNOSIS — M659 Synovitis and tenosynovitis, unspecified: Secondary | ICD-10-CM

## 2013-09-14 DIAGNOSIS — Z7902 Long term (current) use of antithrombotics/antiplatelets: Secondary | ICD-10-CM | POA: Diagnosis not present

## 2013-09-14 DIAGNOSIS — E119 Type 2 diabetes mellitus without complications: Secondary | ICD-10-CM | POA: Diagnosis not present

## 2013-09-14 DIAGNOSIS — Z954 Presence of other heart-valve replacement: Secondary | ICD-10-CM | POA: Insufficient documentation

## 2013-09-14 DIAGNOSIS — Z7901 Long term (current) use of anticoagulants: Secondary | ICD-10-CM | POA: Diagnosis not present

## 2013-09-14 DIAGNOSIS — M65839 Other synovitis and tenosynovitis, unspecified forearm: Secondary | ICD-10-CM | POA: Diagnosis not present

## 2013-09-14 DIAGNOSIS — E785 Hyperlipidemia, unspecified: Secondary | ICD-10-CM | POA: Diagnosis not present

## 2013-09-14 DIAGNOSIS — Z79899 Other long term (current) drug therapy: Secondary | ICD-10-CM | POA: Insufficient documentation

## 2013-09-14 DIAGNOSIS — E669 Obesity, unspecified: Secondary | ICD-10-CM | POA: Diagnosis not present

## 2013-09-14 DIAGNOSIS — I10 Essential (primary) hypertension: Secondary | ICD-10-CM | POA: Insufficient documentation

## 2013-09-14 DIAGNOSIS — Z87448 Personal history of other diseases of urinary system: Secondary | ICD-10-CM | POA: Insufficient documentation

## 2013-09-14 DIAGNOSIS — I4891 Unspecified atrial fibrillation: Secondary | ICD-10-CM | POA: Diagnosis not present

## 2013-09-14 DIAGNOSIS — Z87442 Personal history of urinary calculi: Secondary | ICD-10-CM | POA: Insufficient documentation

## 2013-09-14 DIAGNOSIS — Z951 Presence of aortocoronary bypass graft: Secondary | ICD-10-CM | POA: Insufficient documentation

## 2013-09-14 DIAGNOSIS — E559 Vitamin D deficiency, unspecified: Secondary | ICD-10-CM | POA: Insufficient documentation

## 2013-09-14 DIAGNOSIS — Z8673 Personal history of transient ischemic attack (TIA), and cerebral infarction without residual deficits: Secondary | ICD-10-CM | POA: Insufficient documentation

## 2013-09-14 DIAGNOSIS — M7989 Other specified soft tissue disorders: Secondary | ICD-10-CM | POA: Diagnosis not present

## 2013-09-14 DIAGNOSIS — M65849 Other synovitis and tenosynovitis, unspecified hand: Principal | ICD-10-CM

## 2013-09-14 DIAGNOSIS — M79609 Pain in unspecified limb: Secondary | ICD-10-CM | POA: Diagnosis not present

## 2013-09-14 IMAGING — CR DG HAND COMPLETE 3+V*R*
3 series · 3 of 3 positions shown · non-contrast
Comparison: None.

CLINICAL DATA: Swelling right hand for 3 days after doing yd work
without known injury, pain along palm over the metacarpals 335,
small puncture wound observed over proximal phalanx of second finger
dorsally

EXAM:
RIGHT HAND - COMPLETE 3+ VIEW

[x hand pa right]
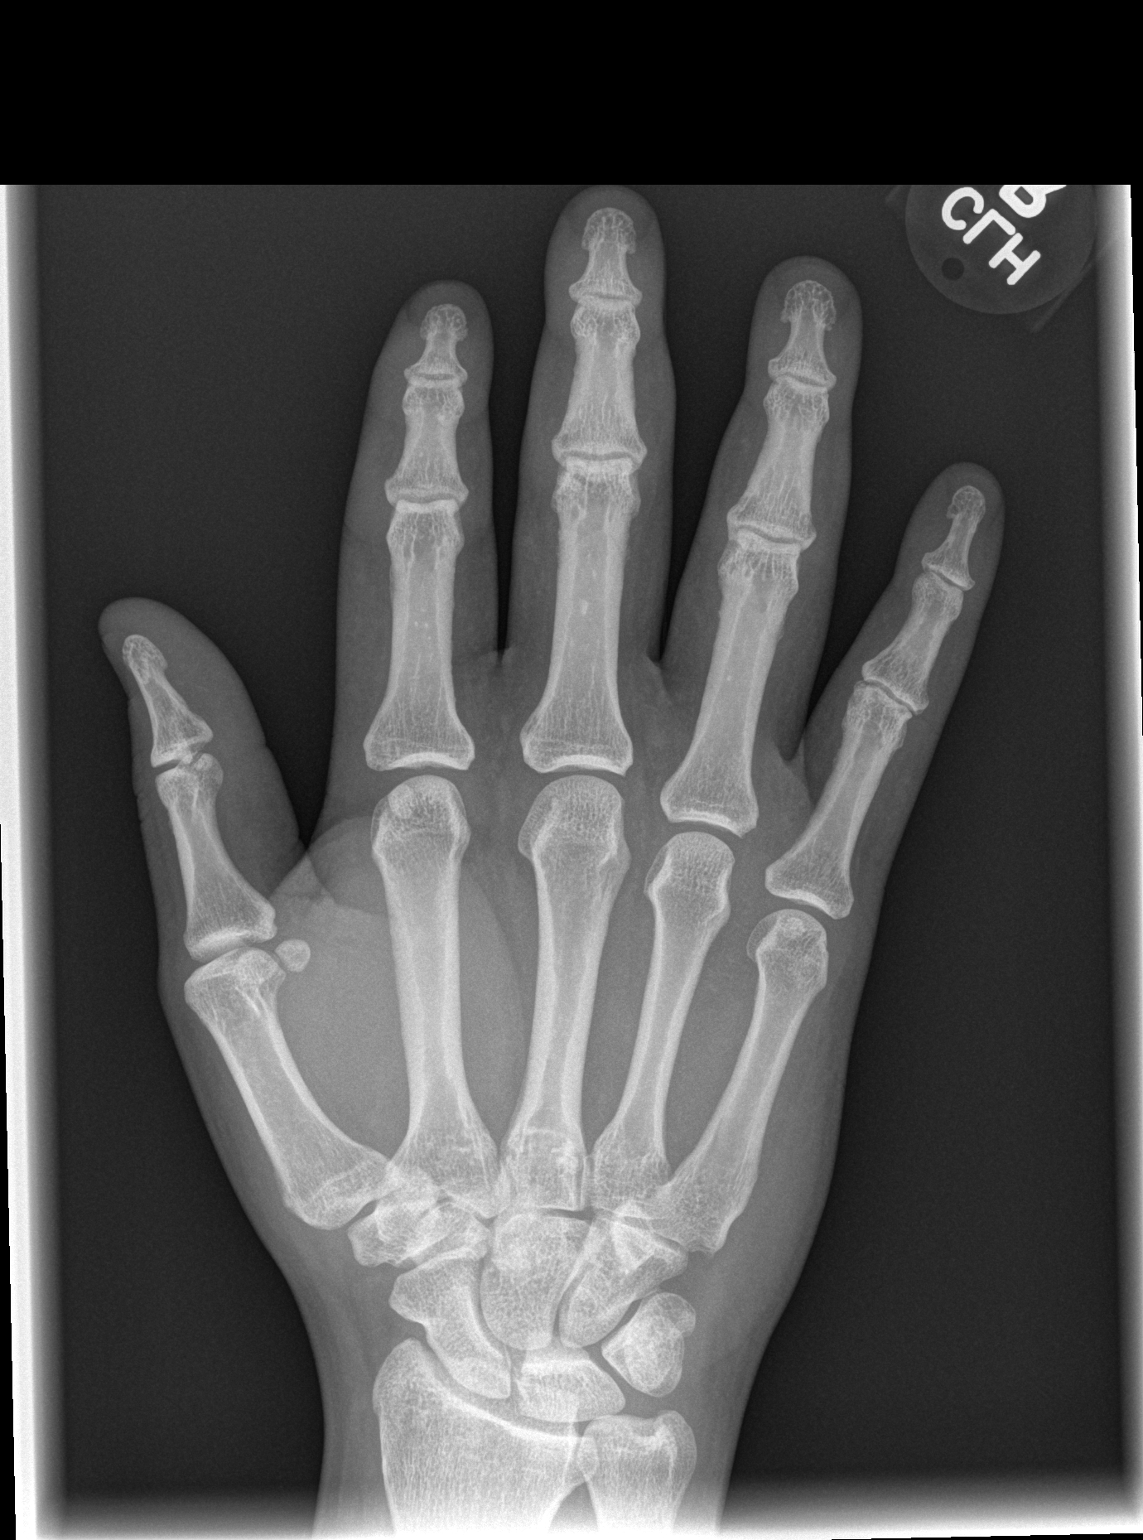

[x hand oblique right]
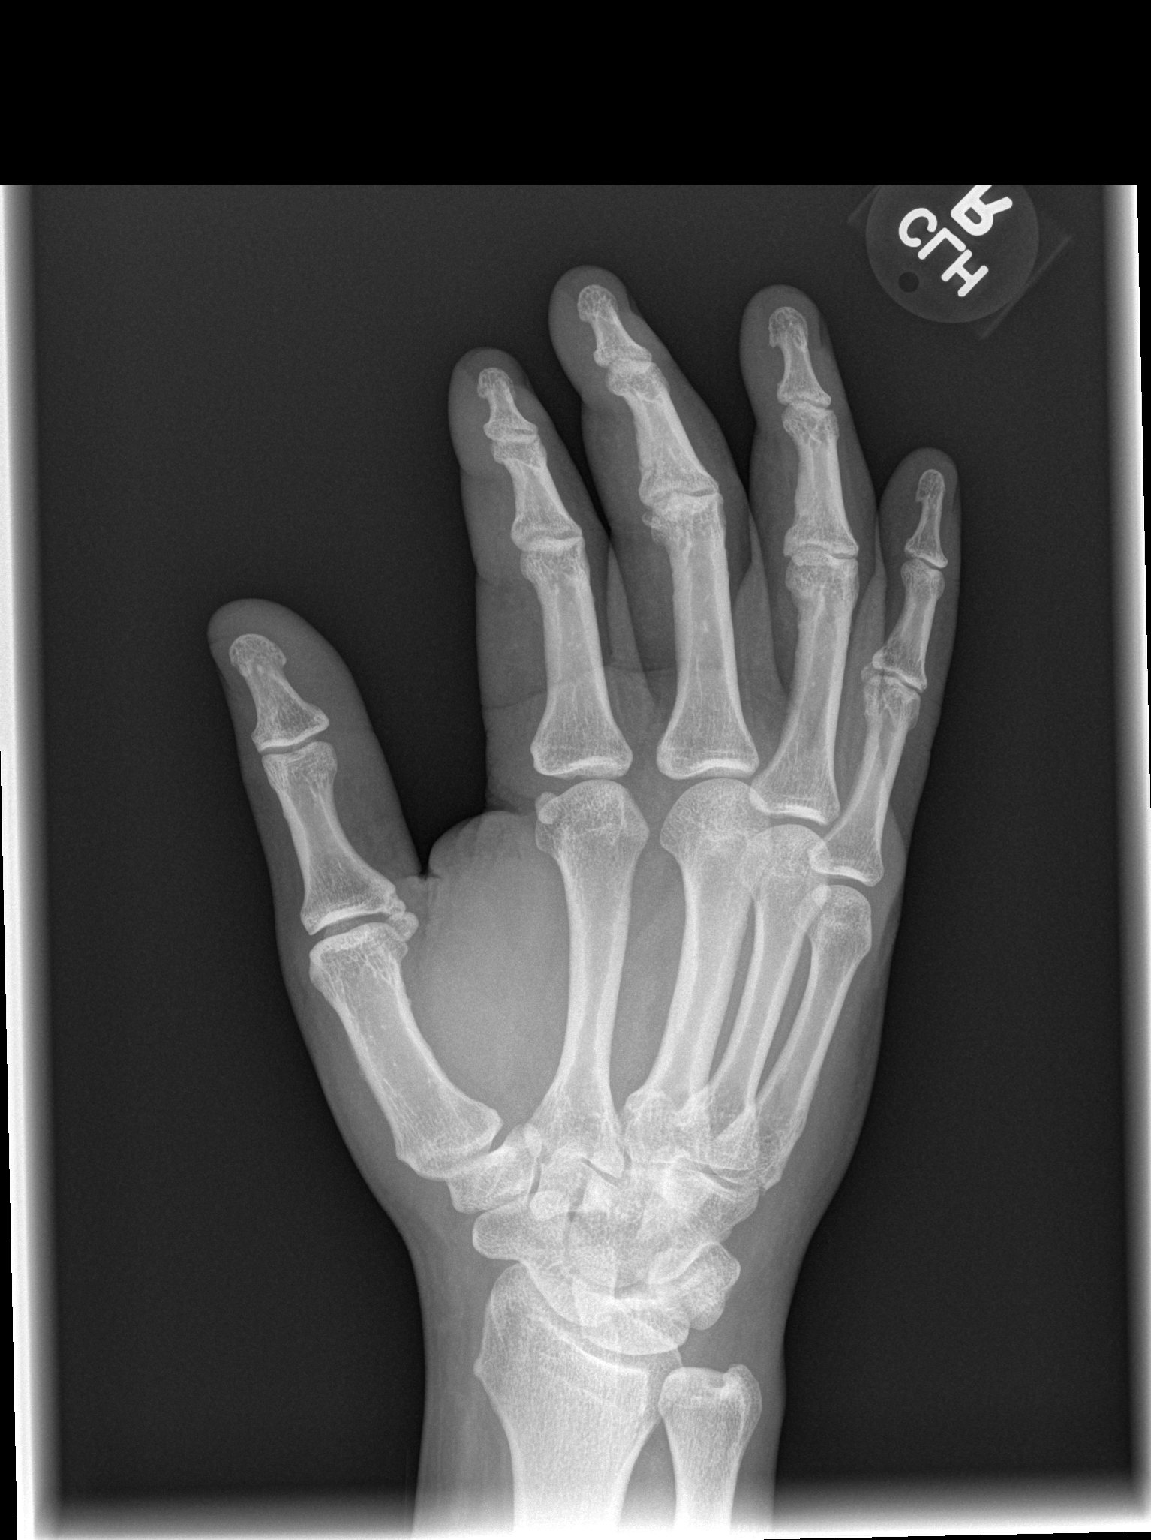

[x hand lat right]
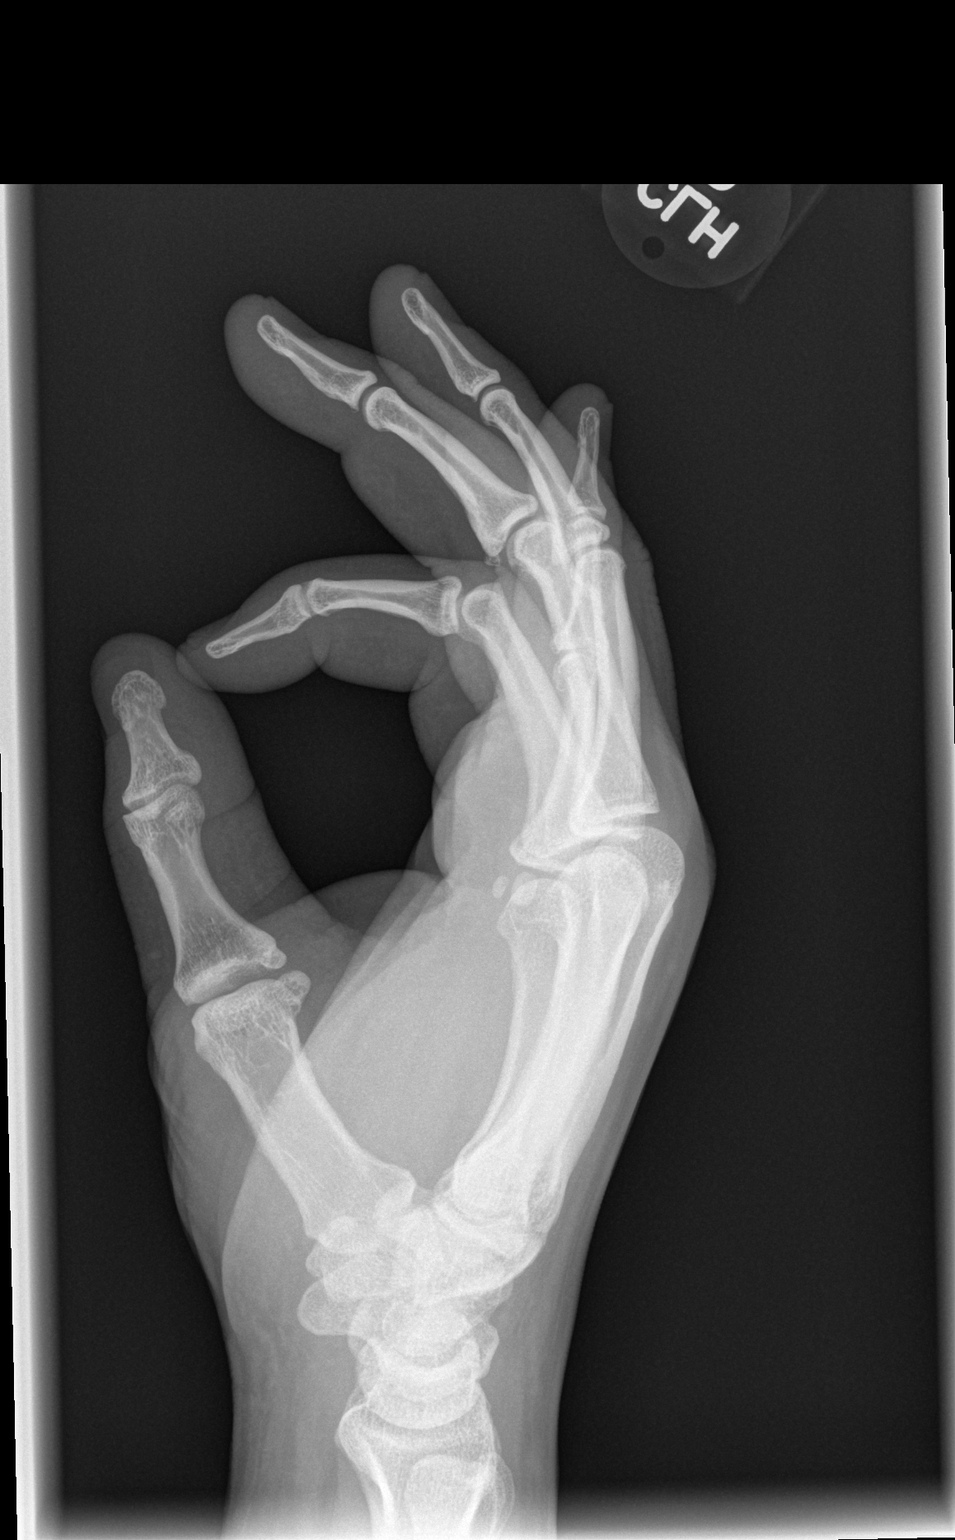

[3 of 3 positions shown; findings below may reference images not displayed]

FINDINGS: Mild diffuse soft tissue swelling. No fracture dislocation or
periosteal reaction. No focal soft tissue opacities.
IMPRESSION: No specific abnormalities. Mild nonspecific soft tissue swelling
diffusely.

## 2013-09-14 MED ORDER — OXYCODONE-ACETAMINOPHEN 5-325 MG PO TABS
1.0000 | ORAL_TABLET | Freq: Once | ORAL | Status: AC
  Administered 2013-09-14: 1 via ORAL
  Filled 2013-09-14: qty 1

## 2013-09-14 MED ORDER — OXYCODONE-ACETAMINOPHEN 5-325 MG PO TABS
1.0000 | ORAL_TABLET | ORAL | Status: DC | PRN
Start: 2013-09-14 — End: 2013-10-11

## 2013-09-14 NOTE — ED Provider Notes (Signed)
CSN: [id number]     Arrival date & time 09/14/13  0824 History  This chart was scribed for non-physician practitioner, Margarita Mail, PA-C, working with Tanna Furry, MD by Roe Coombs, ED Scribe. This patient was seen in room TR06C/TR06C and the patient's care was started at 9:34 AM.   Chief Complaint  Patient presents with  . Hand Pain    The history is provided by the patient. No language interpreter was used.    HPI Comments: [name] is a 48 y.o. male who presents to the Emergency Department complaining of constant, moderate right 4th finger, right palm and right wrist pain with associated swelling that began 2 days ago. Patient states that he was cutting grass and later he developed pain and swelling in his hand. Pain is worse with palpation and movements of his fingers and wrist. There is no numbness or weakness of the extremities, and no nausea, vomiting, fever, chills or headache. Patient has a history of DM, hypertension, hyperlipidemia, PAF, TIA, ascending aortic dissection.  Past Medical History  Diagnosis Date  . Diabetes mellitus   . Hypertension   . Unspecified vitamin D deficiency   . Hyperlipidemia   . H/O cardiovascular stress test     a. Myoview 2011: walked for 63min, images with EF 39%, prominent inf scar, mild reversibility. b. Cardiac CT 06/2010: no CAD, only mild plaque in prox RCA. c. EF normal by echo 2014.  [name] hematuria   . TRANSIENT ISCHEMIC ATTACK, HX OF     a. In 2004.  [name] NEPHROLITHIASIS, HX OF   . PAF (paroxysmal atrial fibrillation)     a. H/o such, with recurrence of coarse afib/flutter during ER consult 03/2012.  . Ascending aortic dissection     a. 2000 - with aortic valve involvement. S/p repair of aortic dissection with placement of mechanical AVR.  [name] Obesity   . H/O mechanical aortic valve replacement   . Cholelithiasis 08/22/2013   Past Surgical History  Procedure Laterality Date  . Appendectomy  1998  . Coronary artery bypass graft   2000  . Aortic valve replacement  2000  . Tee without cardioversion N/A 07/22/2013    Procedure: TRANSESOPHAGEAL ECHOCARDIOGRAM (TEE);  Surgeon: [name], MD;  Location: [facility] ENDOSCOPY;  Service: Cardiovascular;  Laterality: N/A;   Family History  Problem Relation Age of Onset  . Coronary artery disease Mother   . Hypertension Mother   . Diabetes Brother   . Coronary artery disease Other 50    male 1st degree relative, CABG in his 55's (father)   History  Substance Use Topics  . Smoking status: Never Smoker   . Smokeless tobacco: Not on file  . Alcohol Use: No    Review of Systems  Constitutional: Negative for fever and chills.  Gastrointestinal: Negative for vomiting and diarrhea.  Musculoskeletal: Positive for myalgias (right hand and wrist pain).  Neurological: Negative for weakness and numbness.    Allergies  Insulin glargine  Home Medications   Prior to Admission medications   Medication Sig Start Date End Date Taking? Authorizing Provider  acetaminophen (TYLENOL) 325 MG tablet Take 2 tablets (650 mg total) by mouth every 6 (six) hours as needed for moderate pain or headache. 07/25/13   Erlene Quan, PA-C  Cholecalciferol (VITAMIN D3) 1000 UNITS CAPS Take 1,000 Units by mouth daily.     Historical Provider, MD  cloNIDine (CATAPRES) 0.1 MG tablet take 1 tablet by mouth twice a day NEED OFFICE VISIT  FOR REFILLS 08/21/13   Lelon Perla, MD  clopidogrel (PLAVIX) 75 MG tablet Take 1 tablet (75 mg total) by mouth daily with breakfast. 07/25/13   Erlene Quan, PA-C  furosemide (LASIX) 40 MG tablet Take 1 tablet (40 mg total) by mouth 2 (two) times daily. 07/25/13   Erlene Quan, PA-C  glimepiride (AMARYL) 1 MG tablet take 1 tablet by mouth once daily BEFORE BREAKFAST 07/25/13   Biagio Borg, MD  glucose blood test strip 1 per day - Use as instructed 08/30/13   Biagio Borg, MD  Lancets MISC Use 1 per day as directed 08/22/13   Biagio Borg, MD  levothyroxine (SYNTHROID,  LEVOTHROID) 50 MCG tablet Take 1 tablet (50 mcg total) by mouth daily. 08/22/13   Biagio Borg, MD  losartan (COZAAR) 100 MG tablet Take 100 mg by mouth daily.    Historical Provider, MD  metFORMIN (GLUCOPHAGE) 1000 MG tablet Take 1,000 mg by mouth 2 (two) times daily with a meal.    Historical Provider, MD  metoprolol (TOPROL-XL) 200 MG 24 hr tablet Take 1 tablet (200 mg total) by mouth daily. 07/31/13   Burtis Junes, NP  nitroGLYCERIN (NITROSTAT) 0.4 MG SL tablet Place 1 tablet (0.4 mg total) under the tongue every 5 (five) minutes x 3 doses as needed for chest pain. 07/25/13   Erlene Quan, PA-C  potassium chloride SA (K-DUR,KLOR-CON) 20 MEQ tablet Take 20 mEq by mouth 2 (two) times daily.    Historical Provider, MD  pravastatin (PRAVACHOL) 40 MG tablet Take 40 mg by mouth daily.    Historical Provider, MD  warfarin (COUMADIN) 5 MG tablet Take 5-10 mg by mouth daily. Takes 10mg  Thurs and Sun.  5mg  all other days 05/30/13   Deboraha Sprang, MD   Triage Vitals: BP 155/88  Pulse 61  Temp(Src) 97.9 F (36.6 C) (Oral)  Resp 16  SpO2 99% Physical Exam  Constitutional: He is oriented to person, place, and time. He appears well-developed and well-nourished. No distress.  HENT:  Head: Normocephalic and atraumatic.  Eyes: Conjunctivae and EOM are normal.  Neck: Normal range of motion. Neck supple.  Cardiovascular: Normal rate.   Pulmonary/Chest: Effort normal.  Musculoskeletal: Normal range of motion.  Holding right 4th finger in passive flexion. Tender to palpation along the tendon sheath from the palm through the finger. Exquisite tenderness to palpation and passive extension of the finger. No signs of infection.  Neurological: He is alert and oriented to person, place, and time.  Skin: Skin is warm and dry.  Psychiatric: He has a normal mood and affect. His behavior is normal.    ED Course  Procedures (including critical care time) DIAGNOSTIC STUDIES: Oxygen Saturation is 99% on room air,  normal by my interpretation.    COORDINATION OF CARE: 9:36 AM- Patient informed of current plan for treatment and evaluation and agrees with plan at this time.   Imaging Review Dg Hand Complete Right  09/14/2013   CLINICAL DATA:  Swelling right hand for 3 days after doing yd work without known injury, pain along palm over the metacarpals 335, small puncture wound observed over proximal phalanx of second finger dorsally  EXAM: RIGHT HAND - COMPLETE 3+ VIEW  COMPARISON:  None.  FINDINGS: Mild diffuse soft tissue swelling. No fracture dislocation or periosteal reaction. No focal soft tissue opacities.  IMPRESSION: No specific abnormalities. Mild nonspecific soft tissue swelling diffusely.   Electronically Signed   By: Kyung Rudd  Rubner M.D.   On: 09/14/2013 09:26    MDM   Final diagnoses:  Flexor tenosynovitis of finger    Patient's complaint and physical exam consistent with flexor tenosynovitis of the right 4th finger. No obvious signs of infection on exam. X-ray of right hand negative for fracture.I personally reviewed the images using our PACS system.Will provide a finger splint and short course of Percocet for pain. Will not prescribe anti-inflammatory agent because patient is on an anticoagulant. Instead advised him to ice his hand to help with swelling and inflammation. Advised of return precautions. To include signs of infection which was discussed with the patient.  I personally performed the services described in this documentation, which was scribed in my presence. The recorded information has been reviewed and considered.  Margarita Mail, PA-C 09/14/13 [phone]

## 2013-09-14 NOTE — ED Notes (Signed)
Pt. Stated, i was mowing the grass on Thursday and the mowing is going up hill, and my rt. Hand has been hurting ever since.

## 2013-09-14 NOTE — Discharge Instructions (Signed)
Tendinitis Tendinitis is swelling and inflammation of the tendons. Tendons are band-like tissues that connect muscle to bone. Tendinitis commonly occurs in the:   Shoulders (rotator cuff).  Heels (Achilles tendon).  Elbows (triceps tendon). CAUSES Tendinitis is usually caused by overusing the tendon, muscles, and joints involved. When the tissue surrounding a tendon (synovium) becomes inflamed, it is called tenosynovitis. Tendinitis commonly develops in people whose jobs require repetitive motions. SYMPTOMS  Pain.  Tenderness.  Mild swelling. DIAGNOSIS Tendinitis is usually diagnosed by physical exam. Your caregiver may also order X-rays or other imaging tests. TREATMENT Your caregiver may recommend certain medicines or exercises for your treatment. HOME CARE INSTRUCTIONS   Use a sling or splint for as long as directed by your caregiver until the pain decreases.  Put ice on the injured area.  Put ice in a plastic bag.  Place a towel between your skin and the bag.  Leave the ice on for 15-20 minutes, 03-04 times a day.  Avoid using the limb while the tendon is painful. Perform gentle range of motion exercises only as directed by your caregiver. Stop exercises if pain or discomfort increase, unless directed otherwise by your caregiver.  Only take over-the-counter or prescription medicines for pain, discomfort, or fever as directed by your caregiver. SEEK MEDICAL CARE IF:   Your pain and swelling increase.  You develop new, unexplained symptoms, especially increased numbness in the hands. MAKE SURE YOU:   Understand these instructions.  Will watch your condition.  Will get help right away if you are not doing well or get worse. Document Released: 05/06/2000 Document Revised: 08/01/2011 Document Reviewed: 07/26/2010 [facility] Patient Information 2014 [place], [place]. Acetaminophen; Oxycodone tablets What is this medicine? ACETAMINOPHEN; OXYCODONE (a set a MEE noe fen;  ox i KOE done) is a pain reliever. It is used to treat mild to moderate pain. This medicine may be used for other purposes; ask your health care provider or pharmacist if you have questions. COMMON BRAND NAME(S): Endocet, Magnacet, Narvox, Percocet, Perloxx, Primalev, Primlev, Roxicet, Xolox What should I tell my health care provider before I take this medicine? They need to know if you have any of these conditions: -brain tumor -Crohn's disease, inflammatory bowel disease, or ulcerative colitis -drug abuse or addiction -head injury -heart or circulation problems -if you often drink alcohol -kidney disease or problems going to the bathroom -liver disease -lung disease, asthma, or breathing problems -an unusual or allergic reaction to acetaminophen, oxycodone, other opioid analgesics, other medicines, foods, dyes, or preservatives -pregnant or trying to get pregnant -breast-feeding How should I use this medicine? Take this medicine by mouth with a full glass of water. Follow the directions on the prescription label. Take your medicine at regular intervals. Do not take your medicine more often than directed. Talk to your pediatrician regarding the use of this medicine in children. Special care may be needed. Patients over 73 years old may have a stronger reaction and need a smaller dose. Overdosage: If you think you have taken too much of this medicine contact a poison control center or emergency room at once. NOTE: This medicine is only for you. Do not share this medicine with others. What if I miss a dose? If you miss a dose, take it as soon as you can. If it is almost time for your next dose, take only that dose. Do not take double or extra doses. What may interact with this medicine? -alcohol -antihistamines -barbiturates like amobarbital, butalbital, butabarbital, methohexital, pentobarbital, phenobarbital,  thiopental, and secobarbital -benztropine -drugs for bladder problems like  solifenacin, trospium, oxybutynin, tolterodine, hyoscyamine, and methscopolamine -drugs for breathing problems like ipratropium and tiotropium -drugs for certain stomach or intestine problems like propantheline, homatropine methylbromide, glycopyrrolate, atropine, belladonna, and dicyclomine -general anesthetics like etomidate, ketamine, nitrous oxide, propofol, desflurane, enflurane, halothane, isoflurane, and sevoflurane -medicines for depression, anxiety, or psychotic disturbances -medicines for sleep -muscle relaxants -naltrexone -narcotic medicines (opiates) for pain -phenothiazines like perphenazine, thioridazine, chlorpromazine, mesoridazine, fluphenazine, prochlorperazine, promazine, and trifluoperazine -scopolamine -tramadol -trihexyphenidyl This list may not describe all possible interactions. Give your health care provider a list of all the medicines, herbs, non-prescription drugs, or dietary supplements you use. Also tell them if you smoke, drink alcohol, or use illegal drugs. Some items may interact with your medicine. What should I watch for while using this medicine? Tell your doctor or health care professional if your pain does not go away, if it gets worse, or if you have new or a different type of pain. You may develop tolerance to the medicine. Tolerance means that you will need a higher dose of the medication for pain relief. Tolerance is normal and is expected if you take this medicine for a long time. Do not suddenly stop taking your medicine because you may develop a severe reaction. Your body becomes used to the medicine. This does NOT mean you are addicted. Addiction is a behavior related to getting and using a drug for a non-medical reason. If you have pain, you have a medical reason to take pain medicine. Your doctor will tell you how much medicine to take. If your doctor wants you to stop the medicine, the dose will be slowly lowered over time to avoid any side  effects. You may get drowsy or dizzy. Do not drive, use machinery, or do anything that needs mental alertness until you know how this medicine affects you. Do not stand or sit up quickly, especially if you are an older patient. This reduces the risk of dizzy or fainting spells. Alcohol may interfere with the effect of this medicine. Avoid alcoholic drinks. There are different types of narcotic medicines (opiates) for pain. If you take more than one type at the same time, you may have more side effects. Give your health care provider a list of all medicines you use. Your doctor will tell you how much medicine to take. Do not take more medicine than directed. Call emergency for help if you have problems breathing. The medicine will cause constipation. Try to have a bowel movement at least every 2 to 3 days. If you do not have a bowel movement for 3 days, call your doctor or health care professional. Do not take Tylenol (acetaminophen) or medicines that have acetaminophen with this medicine. Too much acetaminophen can be very dangerous. Many nonprescription medicines contain acetaminophen. Always read the labels carefully to avoid taking more acetaminophen. What side effects may I notice from receiving this medicine? Side effects that you should report to your doctor or health care professional as soon as possible: -allergic reactions like skin rash, itching or hives, swelling of the face, lips, or tongue -breathing difficulties, wheezing -confusion -light headedness or fainting spells -severe stomach pain -unusually weak or tired -yellowing of the skin or the whites of the eyes  Side effects that usually do not require medical attention (report to your doctor or health care professional if they continue or are bothersome): -dizziness -drowsiness -nausea -vomiting This list may not describe all possible side effects. Call  your doctor for medical advice about side effects. You may report side effects  to FDA at 1-800-FDA-1088. Where should I keep my medicine? Keep out of the reach of children. This medicine can be abused. Keep your medicine in a safe place to protect it from theft. Do not share this medicine with anyone. Selling or giving away this medicine is dangerous and against the law. Store at room temperature between 20 and 25 degrees C (68 and 77 degrees F). Keep container tightly closed. Protect from light. This medicine may cause accidental overdose and death if it is taken by other adults, children, or pets. Flush any unused medicine down the toilet to reduce the chance of harm. Do not use the medicine after the expiration date. NOTE: This sheet is a summary. It may not cover all possible information. If you have questions about this medicine, talk to your doctor, pharmacist, or health care provider.  2014, Elsevier/Gold Standard. (2012-12-31 13:17:35)

## 2013-09-14 NOTE — Progress Notes (Signed)
Orthopedic Tech Progress Note Patient Details:  [name] May 13, 1966 [id number]  Ortho Devices Type of Ortho Device: Finger splint Ortho Device/Splint Interventions: Application   Asia R Thompson 09/14/2013, 10:01 AM

## 2013-09-14 NOTE — ED Notes (Signed)
Denies any injury  

## 2013-09-26 ENCOUNTER — Ambulatory Visit (INDEPENDENT_AMBULATORY_CARE_PROVIDER_SITE_OTHER): Payer: Medicare Other | Admitting: *Deleted

## 2013-09-26 DIAGNOSIS — I4891 Unspecified atrial fibrillation: Secondary | ICD-10-CM

## 2013-09-26 DIAGNOSIS — Z5181 Encounter for therapeutic drug level monitoring: Secondary | ICD-10-CM | POA: Diagnosis not present

## 2013-09-26 DIAGNOSIS — I480 Paroxysmal atrial fibrillation: Secondary | ICD-10-CM

## 2013-09-26 DIAGNOSIS — Z8679 Personal history of other diseases of the circulatory system: Secondary | ICD-10-CM | POA: Diagnosis not present

## 2013-09-26 LAB — POCT INR: INR: 2.7

## 2013-10-01 NOTE — ED Provider Notes (Signed)
Medical screening examination/treatment/procedure(s) were performed by non-physician practitioner and as supervising physician I was immediately available for consultation/collaboration.   EKG Interpretation None        Tanna Furry, MD 10/01/13 [phone]

## 2013-10-11 ENCOUNTER — Ambulatory Visit (INDEPENDENT_AMBULATORY_CARE_PROVIDER_SITE_OTHER): Payer: Medicare Other | Admitting: Cardiology

## 2013-10-11 ENCOUNTER — Encounter: Payer: Self-pay | Admitting: Cardiology

## 2013-10-11 ENCOUNTER — Ambulatory Visit (INDEPENDENT_AMBULATORY_CARE_PROVIDER_SITE_OTHER): Payer: Medicare Other | Admitting: *Deleted

## 2013-10-11 VITALS — BP 130/92 | HR 57 | Ht 74.0 in | Wt 311.0 lb

## 2013-10-11 DIAGNOSIS — I5031 Acute diastolic (congestive) heart failure: Secondary | ICD-10-CM

## 2013-10-11 DIAGNOSIS — E785 Hyperlipidemia, unspecified: Secondary | ICD-10-CM | POA: Diagnosis not present

## 2013-10-11 DIAGNOSIS — I4891 Unspecified atrial fibrillation: Secondary | ICD-10-CM

## 2013-10-11 DIAGNOSIS — G459 Transient cerebral ischemic attack, unspecified: Secondary | ICD-10-CM

## 2013-10-11 DIAGNOSIS — I7101 Dissection of ascending aorta: Secondary | ICD-10-CM

## 2013-10-11 DIAGNOSIS — Z8679 Personal history of other diseases of the circulatory system: Secondary | ICD-10-CM

## 2013-10-11 DIAGNOSIS — I71019 Dissection of thoracic aorta, unspecified: Secondary | ICD-10-CM | POA: Diagnosis not present

## 2013-10-11 DIAGNOSIS — I200 Unstable angina: Secondary | ICD-10-CM | POA: Diagnosis not present

## 2013-10-11 DIAGNOSIS — Z5181 Encounter for therapeutic drug level monitoring: Secondary | ICD-10-CM

## 2013-10-11 DIAGNOSIS — I359 Nonrheumatic aortic valve disorder, unspecified: Secondary | ICD-10-CM

## 2013-10-11 DIAGNOSIS — I25810 Atherosclerosis of coronary artery bypass graft(s) without angina pectoris: Secondary | ICD-10-CM

## 2013-10-11 DIAGNOSIS — I10 Essential (primary) hypertension: Secondary | ICD-10-CM

## 2013-10-11 LAB — POCT INR: INR: 3.2

## 2013-10-11 NOTE — Assessment & Plan Note (Signed)
Continue statin. 

## 2013-10-11 NOTE — Patient Instructions (Signed)
Your physician wants you to follow-up in: 6 MONTHS WITH DR CRENSHAW You will receive a reminder letter in the mail two months in advance. If you don't receive a letter, please call our office to schedule the follow-up appointment.   STOP ASPIRIN 

## 2013-10-11 NOTE — Assessment & Plan Note (Signed)
Continue present blood pressure medications. 

## 2013-10-11 NOTE — Assessment & Plan Note (Signed)
Patient is euvolemic on examination.Continue present dose of Lasix. 

## 2013-10-11 NOTE — Assessment & Plan Note (Signed)
Patient will need followup CTA of his thoracic aorta in February of 2016. The gradients across his mechanical valve are elevated but this has been relatively stable over the past several years. This is most likely related to patient valve mismatch. He is not symptomatic with no dyspnea and no edema. We will repeat his echocardiogram in February of 2016. Continue Coumadin and SBE prophylaxis.

## 2013-10-11 NOTE — Assessment & Plan Note (Signed)
Plan to discontinue aspirin as patient is also on Coumadin. Continue Plavix. Continue statin.

## 2013-10-11 NOTE — Progress Notes (Signed)
HPI: fu CAD and h/o spontaneous ascending aortic dissection with aortic valve involvement status post mechanical AVR with ascending aorta conduit in 2000. Cardiac catheterization in February 2015 showed a patent left main, LAD, circumflex and an occluded right coronary artery. The saphenous vein graft to the right coronary artery had an ostial 90% lesion. The patient had PCI of the saphenous vein graft to the right coronary artery. Fluoroscopy showed his valve opened well. Aortic angiogram showed a dilated root with residual dissection just distal to the origin of the innominate. Echocardiogram in February 2015 showed normal LV function, mechanical aortic valve with moderate to severe stenosis with a mean gradient of 36 mm of mercury (Note the mean gradient across his aortic valve had been elevated in the past). There was trace aortic insufficiency. There was biatrial enlargement, mild right ventricular enlargement and mild regurgitation. Moderately elevated pulmonary pressures. Transesophageal echocardiogram in February 2015 showed an ejection fraction of 50%, mild mitral regurgitation, pseudo-normal mitral inflow. CTA in March of 2015 showed postoperative changes of previous repair of aortic dissection with an intimal flap arising in the distal ascending aorta and extending to the origin of the left subclavian artery stable. Pulmonary arteries were enlarged. Patient seen by cardiothoracic surgery for elevated mean gradient during recent admission and this was felt to be patient valve mismatch. Followup echoes as well as CTs to follow aortic dissection recommended. Since he was last seen, the patient denies any dyspnea on exertion, orthopnea, PND, pedal edema, palpitations, syncope or chest pain.   Current Outpatient Prescriptions  Medication Sig Dispense Refill  . aspirin 81 MG tablet Take 81 mg by mouth daily.      . cloNIDine (CATAPRES) 0.1 MG tablet take 1 tablet by mouth twice a day NEED OFFICE  VISIT FOR REFILLS  60 tablet  1  . clopidogrel (PLAVIX) 75 MG tablet Take 1 tablet (75 mg total) by mouth daily with breakfast.  30 tablet  0  . furosemide (LASIX) 40 MG tablet Take 1 tablet (40 mg total) by mouth 2 (two) times daily.  60 tablet  11  . glimepiride (AMARYL) 1 MG tablet take 1 tablet by mouth once daily BEFORE BREAKFAST  90 tablet  3  . glucose blood test strip 1 per day - Use as instructed  100 each  12  . Lancets MISC Use 1 per day as directed  100 each  12  . levothyroxine (SYNTHROID, LEVOTHROID) 50 MCG tablet Take 1 tablet (50 mcg total) by mouth daily.  90 tablet  3  . losartan (COZAAR) 100 MG tablet Take 100 mg by mouth daily.      . metFORMIN (GLUCOPHAGE) 1000 MG tablet Take 1,000 mg by mouth 2 (two) times daily with a meal.      . metoprolol (TOPROL-XL) 200 MG 24 hr tablet Take 1 tablet (200 mg total) by mouth daily.  60 tablet  6  . nitroGLYCERIN (NITROSTAT) 0.4 MG SL tablet Place 1 tablet (0.4 mg total) under the tongue every 5 (five) minutes x 3 doses as needed for chest pain.  25 tablet  2  . potassium chloride SA (K-DUR,KLOR-CON) 20 MEQ tablet Take 20 mEq by mouth 2 (two) times daily.      . pravastatin (PRAVACHOL) 40 MG tablet Take 40 mg by mouth daily.      [name] warfarin (COUMADIN) 5 MG tablet Take 5-10 mg by mouth daily. Takes 10mg  Thurs and Sun.  5mg  all other days  No current facility-administered medications for this visit.     Past Medical History  Diagnosis Date  . Diabetes mellitus   . Hypertension   . Unspecified vitamin D deficiency   . Hyperlipidemia   . H/O cardiovascular stress test     a. Myoview 2011: walked for 85min, images with EF 39%, prominent inf scar, mild reversibility. b. Cardiac CT 06/2010: no CAD, only mild plaque in prox RCA. c. EF normal by echo 2014.  [name] hematuria   . TRANSIENT ISCHEMIC ATTACK, HX OF     a. In 2004.  [name] NEPHROLITHIASIS, HX OF   . PAF (paroxysmal atrial fibrillation)     a. H/o such, with recurrence of  coarse afib/flutter during ER consult 03/2012.  . Ascending aortic dissection     a. 2000 - with aortic valve involvement. S/p repair of aortic dissection with placement of mechanical AVR.  [name] Obesity   . H/O mechanical aortic valve replacement   . Cholelithiasis 08/22/2013  . CAD (coronary artery disease)     Past Surgical History  Procedure Laterality Date  . Appendectomy  1998  . Coronary artery bypass graft  2000  . Aortic valve replacement  2000  . Tee without cardioversion N/A 07/22/2013    Procedure: TRANSESOPHAGEAL ECHOCARDIOGRAM (TEE);  Surgeon: [name], MD;  Location: [facility] ENDOSCOPY;  Service: Cardiovascular;  Laterality: N/A;    History   Social History  . Marital Status: Married    Spouse Name: N/A    Number of Children: 4  . Years of Education: N/A   Occupational History  . DISABLED    Social History Main Topics  . Smoking status: Never Smoker   . Smokeless tobacco: Not on file  . Alcohol Use: No  . Drug Use: No  . Sexual Activity: Not on file   Other Topics Concern  . Not on file   Social History Narrative  . No narrative on file    ROS: no fevers or chills, productive cough, hemoptysis, dysphasia, odynophagia, melena, hematochezia, dysuria, hematuria, rash, seizure activity, orthopnea, PND, pedal edema, claudication. Remaining systems are negative.  Physical Exam: Well-developed obese in no acute distress.  Skin is warm and dry.  HEENT is normal.  Neck is supple.  Chest is clear to auscultation with normal expansion.  Cardiovascular exam is regular rate and rhythm. Crisp mechanical valve. 2/6 systolic murmur left sternal border. No diastolic murmur. Abdominal exam nontender or distended. No masses palpated. Extremities show no edema. neuro grossly intact  ECG Sinus bradycardia at a rate of 57. Nonspecific T-wave changes. Left ventricular hypertrophy. Prior inferior infarct.

## 2013-10-29 ENCOUNTER — Ambulatory Visit (INDEPENDENT_AMBULATORY_CARE_PROVIDER_SITE_OTHER): Payer: Medicare Other | Admitting: Pharmacist Clinician (PhC)/ Clinical Pharmacy Specialist

## 2013-10-29 DIAGNOSIS — Z8679 Personal history of other diseases of the circulatory system: Secondary | ICD-10-CM | POA: Diagnosis not present

## 2013-10-29 DIAGNOSIS — I4891 Unspecified atrial fibrillation: Secondary | ICD-10-CM

## 2013-10-29 DIAGNOSIS — Z5181 Encounter for therapeutic drug level monitoring: Secondary | ICD-10-CM | POA: Diagnosis not present

## 2013-10-29 DIAGNOSIS — I480 Paroxysmal atrial fibrillation: Secondary | ICD-10-CM

## 2013-10-29 LAB — POCT INR: INR: 1.8

## 2013-10-31 ENCOUNTER — Encounter (HOSPITAL_COMMUNITY): Payer: Self-pay | Admitting: Emergency Medicine

## 2013-10-31 ENCOUNTER — Emergency Department (HOSPITAL_COMMUNITY)
Admission: EM | Admit: 2013-10-31 | Discharge: 2013-10-31 | Disposition: A | Discharge status: Home / Self Care | Payer: Medicare Other | Attending: Emergency Medicine | Admitting: Emergency Medicine

## 2013-10-31 DIAGNOSIS — F329 Major depressive disorder, single episode, unspecified: Secondary | ICD-10-CM | POA: Diagnosis present

## 2013-10-31 DIAGNOSIS — I4891 Unspecified atrial fibrillation: Secondary | ICD-10-CM | POA: Diagnosis not present

## 2013-10-31 DIAGNOSIS — I2510 Atherosclerotic heart disease of native coronary artery without angina pectoris: Secondary | ICD-10-CM | POA: Diagnosis not present

## 2013-10-31 DIAGNOSIS — Z951 Presence of aortocoronary bypass graft: Secondary | ICD-10-CM | POA: Diagnosis not present

## 2013-10-31 DIAGNOSIS — Z8673 Personal history of transient ischemic attack (TIA), and cerebral infarction without residual deficits: Secondary | ICD-10-CM | POA: Diagnosis not present

## 2013-10-31 DIAGNOSIS — E119 Type 2 diabetes mellitus without complications: Secondary | ICD-10-CM | POA: Insufficient documentation

## 2013-10-31 DIAGNOSIS — Z79899 Other long term (current) drug therapy: Secondary | ICD-10-CM | POA: Insufficient documentation

## 2013-10-31 DIAGNOSIS — Z87442 Personal history of urinary calculi: Secondary | ICD-10-CM | POA: Insufficient documentation

## 2013-10-31 DIAGNOSIS — F919 Conduct disorder, unspecified: Secondary | ICD-10-CM | POA: Diagnosis not present

## 2013-10-31 DIAGNOSIS — E785 Hyperlipidemia, unspecified: Secondary | ICD-10-CM | POA: Insufficient documentation

## 2013-10-31 DIAGNOSIS — I10 Essential (primary) hypertension: Secondary | ICD-10-CM

## 2013-10-31 DIAGNOSIS — Z954 Presence of other heart-valve replacement: Secondary | ICD-10-CM | POA: Diagnosis not present

## 2013-10-31 DIAGNOSIS — Z7902 Long term (current) use of antithrombotics/antiplatelets: Secondary | ICD-10-CM | POA: Insufficient documentation

## 2013-10-31 DIAGNOSIS — Z7901 Long term (current) use of anticoagulants: Secondary | ICD-10-CM | POA: Insufficient documentation

## 2013-10-31 DIAGNOSIS — F4310 Post-traumatic stress disorder, unspecified: Secondary | ICD-10-CM | POA: Diagnosis present

## 2013-10-31 DIAGNOSIS — R443 Hallucinations, unspecified: Secondary | ICD-10-CM | POA: Diagnosis not present

## 2013-10-31 DIAGNOSIS — F32A Depression, unspecified: Secondary | ICD-10-CM | POA: Diagnosis present

## 2013-10-31 DIAGNOSIS — Z8719 Personal history of other diseases of the digestive system: Secondary | ICD-10-CM | POA: Diagnosis not present

## 2013-10-31 DIAGNOSIS — F29 Unspecified psychosis not due to a substance or known physiological condition: Secondary | ICD-10-CM | POA: Diagnosis not present

## 2013-10-31 DIAGNOSIS — IMO0002 Reserved for concepts with insufficient information to code with codable children: Secondary | ICD-10-CM | POA: Insufficient documentation

## 2013-10-31 DIAGNOSIS — E669 Obesity, unspecified: Secondary | ICD-10-CM | POA: Insufficient documentation

## 2013-10-31 DIAGNOSIS — X789XXA Intentional self-harm by unspecified sharp object, initial encounter: Secondary | ICD-10-CM | POA: Insufficient documentation

## 2013-10-31 LAB — ETHANOL: Alcohol, Ethyl (B): 172 mg/dL — ABNORMAL HIGH (ref 0–11)

## 2013-10-31 LAB — COMPREHENSIVE METABOLIC PANEL
ALT: 14 U/L (ref 0–53)
AST: 25 U/L (ref 0–37)
Albumin: 4 g/dL (ref 3.5–5.2)
Alkaline Phosphatase: 66 U/L (ref 39–117)
BUN: 14 mg/dL (ref 6–23)
CALCIUM: 9.6 mg/dL (ref 8.4–10.5)
CO2: 24 mEq/L (ref 19–32)
Chloride: 98 mEq/L (ref 96–112)
Creatinine, Ser: 0.97 mg/dL (ref 0.50–1.35)
GFR calc Af Amer: >90 mL/min (ref >90)
Glucose, Bld: 201 mg/dL — ABNORMAL HIGH (ref 70–99)
Potassium: 3.5 mEq/L — ABNORMAL LOW (ref 3.7–5.3)
SODIUM: 139 meq/L (ref 137–147)
Total Bilirubin: 0.8 mg/dL (ref 0.3–1.2)
Total Protein: 8.8 g/dL — ABNORMAL HIGH (ref 6.0–8.3)

## 2013-10-31 LAB — CBC
HCT: 38.6 % — ABNORMAL LOW (ref 39.0–52.0)
Hemoglobin: 13.7 g/dL (ref 13.0–17.0)
MCH: 28.1 pg (ref 26.0–34.0)
MCHC: 35.5 g/dL (ref 30.0–36.0)
MCV: 79.1 fL (ref 78.0–100.0)
PLATELETS: 162 10*3/uL (ref 150–400)
RBC: 4.88 MIL/uL (ref 4.22–5.81)
RDW: 15.7 % — AB (ref 11.5–15.5)
WBC: 6.6 10*3/uL (ref 4.0–10.5)

## 2013-10-31 LAB — RAPID URINE DRUG SCREEN, HOSP PERFORMED
Amphetamines: NOT DETECTED
Barbiturates: NOT DETECTED
Benzodiazepines: NOT DETECTED
Cocaine: NOT DETECTED
OPIATES: NOT DETECTED
TETRAHYDROCANNABINOL: NOT DETECTED

## 2013-10-31 LAB — PROTIME-INR
INR: 2.18 — AB (ref 0.00–1.49)
PROTHROMBIN TIME: 23.6 s — AB (ref 11.6–15.2)

## 2013-10-31 LAB — CBG MONITORING, ED: GLUCOSE-CAPILLARY: 158 mg/dL — AB (ref 70–99)

## 2013-10-31 MED ORDER — LEVOTHYROXINE SODIUM 50 MCG PO TABS
50.0000 ug | ORAL_TABLET | Freq: Every day | ORAL | Status: DC
  Administered 2013-10-31: 50 ug via ORAL
  Filled 2013-10-31 (×2): qty 1

## 2013-10-31 MED ORDER — METFORMIN HCL 500 MG PO TABS
1000.0000 mg | ORAL_TABLET | Freq: Two times a day (BID) | ORAL | Status: DC
  Administered 2013-10-31: 1000 mg via ORAL
  Filled 2013-10-31 (×3): qty 2

## 2013-10-31 MED ORDER — CLOPIDOGREL BISULFATE 75 MG PO TABS
75.0000 mg | ORAL_TABLET | Freq: Every day | ORAL | Status: DC
  Administered 2013-10-31: 75 mg via ORAL
  Filled 2013-10-31 (×2): qty 1

## 2013-10-31 MED ORDER — WARFARIN SODIUM 5 MG PO TABS
5.0000 mg | ORAL_TABLET | ORAL | Status: DC
  Filled 2013-10-31: qty 1

## 2013-10-31 MED ORDER — FUROSEMIDE 40 MG PO TABS
40.0000 mg | ORAL_TABLET | Freq: Two times a day (BID) | ORAL | Status: DC
  Administered 2013-10-31: 40 mg via ORAL
  Filled 2013-10-31 (×3): qty 1

## 2013-10-31 MED ORDER — ALUM & MAG HYDROXIDE-SIMETH 200-200-20 MG/5ML PO SUSP: 30.0000 mL | ORAL | Status: DC | PRN

## 2013-10-31 MED ORDER — GLIMEPIRIDE 1 MG PO TABS
1.0000 mg | ORAL_TABLET | Freq: Every day | ORAL | Status: DC
  Administered 2013-10-31: 1 mg via ORAL
  Filled 2013-10-31 (×2): qty 1

## 2013-10-31 MED ORDER — SIMVASTATIN 20 MG PO TABS
20.0000 mg | ORAL_TABLET | Freq: Every day | ORAL | Status: DC
  Filled 2013-10-31: qty 1

## 2013-10-31 MED ORDER — POTASSIUM CHLORIDE CRYS ER 20 MEQ PO TBCR
20.0000 meq | EXTENDED_RELEASE_TABLET | Freq: Two times a day (BID) | ORAL | Status: DC
  Administered 2013-10-31: 20 meq via ORAL
  Filled 2013-10-31: qty 1

## 2013-10-31 MED ORDER — WARFARIN SODIUM 10 MG PO TABS
10.0000 mg | ORAL_TABLET | ORAL | Status: DC
Start: 2013-11-01 — End: 2013-10-31

## 2013-10-31 MED ORDER — ZOLPIDEM TARTRATE 5 MG PO TABS: 5.0000 mg | ORAL_TABLET | Freq: Every evening | ORAL | Status: DC | PRN

## 2013-10-31 MED ORDER — WARFARIN - PHYSICIAN DOSING INPATIENT: Freq: Every day | Status: DC

## 2013-10-31 MED ORDER — LORAZEPAM 1 MG PO TABS: 1.0000 mg | ORAL_TABLET | Freq: Three times a day (TID) | ORAL | Status: DC | PRN

## 2013-10-31 MED ORDER — ACETAMINOPHEN 325 MG PO TABS
650.0000 mg | ORAL_TABLET | ORAL | Status: DC | PRN
  Filled 2013-10-31: qty 2

## 2013-10-31 MED ORDER — METOPROLOL SUCCINATE ER 100 MG PO TB24
200.0000 mg | ORAL_TABLET | Freq: Every day | ORAL | Status: DC
  Administered 2013-10-31: 200 mg via ORAL
  Filled 2013-10-31: qty 2

## 2013-10-31 MED ORDER — CLONIDINE HCL 0.1 MG PO TABS: 0.1000 mg | ORAL_TABLET | Freq: Two times a day (BID) | ORAL | Status: DC | PRN

## 2013-10-31 MED ORDER — ONDANSETRON HCL 4 MG PO TABS: 4.0000 mg | ORAL_TABLET | Freq: Three times a day (TID) | ORAL | Status: DC | PRN

## 2013-10-31 MED ORDER — NICOTINE 21 MG/24HR TD PT24
21.0000 mg | MEDICATED_PATCH | Freq: Every day | TRANSDERMAL | Status: DC
  Filled 2013-10-31: qty 1

## 2013-10-31 MED ORDER — LOSARTAN POTASSIUM 50 MG PO TABS
100.0000 mg | ORAL_TABLET | Freq: Every day | ORAL | Status: DC
  Administered 2013-10-31: 100 mg via ORAL
  Filled 2013-10-31: qty 2

## 2013-10-31 NOTE — ED Notes (Signed)
Patient requested to call his daughter to wake her up for work. Patient called his daughter. No acute distress noted. Encouragement and support provided and safety maintain.

## 2013-10-31 NOTE — Discharge Instructions (Signed)
Depression, Adult Depression is feeling sad, low, down in the dumps, blue, gloomy, or empty. In general, there are two kinds of depression:  Normal sadness or grief. This can happen after something upsetting. It often goes away on its own within 2 weeks. After losing a loved one (bereavement), normal sadness and grief may last longer than two weeks. It usually gets better with time.  Clinical depression. This kind lasts longer than normal sadness or grief. It keeps you from doing the things you normally do in life. It is often hard to function at home, work, or at school. It may affect your relationships with others. Treatment is often needed. GET HELP RIGHT AWAY IF:  You have thoughts about hurting yourself or others.  You lose touch with reality (psychotic symptoms). You may:  See or hear things that are not real.  Have untrue beliefs about your life or people around you.  Your medicine is giving you problems. MAKE SURE YOU:  Understand these instructions.  Will watch your condition.  Will get help right away if you are not doing well or get worse. Document Released: 06/11/2010 Document Revised: 02/01/2012 Document Reviewed: 09/08/2011 [facility] Patient Information 2014 [place], [place].  Hallucinations and Delusions You seem to be having hallucinations and/or delusions. You may be hearing voices that no one else can hear. This can seem very real to you. You may be having thoughts and fears that do not make sense to others. This condition can be due to mental disease like schizophrenia. It may be caused by a medical condition, such as an infection or electrolyte disturbance. These symptoms are also seen in drug abusers, especially those who use crack cocaine and amphetamines. Drugs like PCP, LSD, MDMA, peyote, and psilocybin can also cause frightening hallucinations and loss of control. If your symptoms are due to drug abuse, your mental state should improve as the drug(s) leave your  system. Someone you trust should be with you until you are better to protect you and calm your fears. Often tranquilizers are very helpful at controlling hallucinations, anxiety, and destructive behavior. Getting a proper diet and enough sleep is important to recovery. If your symptoms are not due to drugs, or do not improve over several days after stopping drug use, you need further medical or mental health care. SEEK IMMEDIATE MEDICAL CARE IF:   Your symptoms get worse, especially if you think your life is in danger  You have violent or destructive thoughts. Recovery is possible, but you have to get proper treatment and avoid drugs that are known to cause you trouble. Document Released: 06/16/2004 Document Revised: 08/01/2011 Document Reviewed: 05/09/2005 [facility] Patient Information 2014 [place].  How to Take Your Blood Pressure  These instructions are only for electronic home blood pressure machines. You will need:   An automatic or semi-automatic blood pressure machine.  Fresh batteries for the blood pressure machine. HOW DO I USE THESE TOOLS TO CHECK MY BLOOD PRESSURE?   There are 2 numbers that make up your blood pressure. For example: 120/80.  The first number (120 in our example) is called the "systolic pressure." It is a measure of the pressure in your blood vessels when your heart is pumping blood.  The second number (80 in our example) is called the "diastolic pressure." It is a measure of the pressure in your blood vessels when your heart is resting between beats.  Before you buy a home blood pressure machine, check the size of your arm so you  can buy the right size cuff. Here is how to check the size of your arm:  Use a tape measure that shows both inches and centimeters.  Wrap the tape measure around the middle upper part of your arm. You may need someone to help you measure right.  Write down your arm measurement in both inches and centimeters.  To measure your  blood pressure right, it is important to have the right size cuff.  If your arm is up to 13 inches (37 to 34 centimeters), get an adult cuff size.  If your arm is 13 to 17 inches (35 to 44 centimeters), get a large adult cuff size.  If your arm is 17 to 20 inches (45 to 52 centimeters), get an adult thigh cuff.  Try to rest or relax for at least 30 minutes before you check your blood pressure.  Do not smoke.  Do not have any drinks with caffeine, such as:  Pop.  Coffee.  Tea.  Check your blood pressure in a quiet room.  Sit down and stretch out your arm on a table. Keep your arm at about the level of your heart. Let your arm relax. GETTING BLOOD PRESSURE READINGS  Make sure you remove any tight-fighting clothing from your arm. Wrap the cuff around your upper arm. Wrap it just above the bend, and above where you felt the pulse. You should be able to slip a finger between the cuff and your arm. If you cannot slip a finger in the cuff, it is too tight and should be removed and rewrapped.  Some units requires you to manually pump up the arm cuff.  Automatic units inflate the cuff when you press a button.  Cuff deflation is automatic in both models.  After the cuff is inflated, the unit measures your blood pressure and pulse. The readings are displayed on a monitor. Hold still and breathe normally while the cuff is inflated.  Getting a reading takes less than a minute.  Some models store readings in a memory. Some provide a printout of readings.  Get readings at different times of the day. You should wait at least 5 minutes between readings. Take readings with you to your next doctor's visit. Document Released: 04/21/2008 Document Revised: 08/01/2011 Document Reviewed: 04/21/2008 [facility] Patient Information 2014 [place].  Post-traumatic Stress You have post-traumatic stress disorder (PTSD). This condition causes many different symptoms including: emotional outbursts,  anxiety, sleeping problems, social withdrawal, and drug abuse. PTSD often follows a particularly traumatic event such as war, or natural disasters like hurricanes, earthquakes, or floods. It can also be seen after personal traumas such as accidents, rape, or the death of someone you love. Symptoms may be delayed for days or even years. Emotional numbing and the inability to feel your emotions, may be the earliest sign. Periods of agitation, aggression, and inability to perform ordinary tasks are common with PTSD. Nightmares and daytime memories of the trauma often bring on uncontrolled symptoms. Sufferers typically startle easily and avoid reminders of the trauma. Panic attacks, feelings of extreme guilt, and blackouts are often reported. Treatment is very helpful, especially group therapy. Healing happens when emotional traumas are shared with others who have a sympathetic ear. The [facility] have helped over 185,000 veterans with this problem. Medication is also very effective. The symptoms can become chronic and lifelong, so it is important to get help. Call your caregiver or a counselor who deals with this type of problem for  further assistance. Document Released: 06/16/2004 Document Revised: 08/01/2011 Document Reviewed: 05/09/2005 [facility] Patient Information 2014 [place].

## 2013-10-31 NOTE — Progress Notes (Signed)
PHARMACIST - PHYSICIAN COMMUNICATION EDP CONCERNING: Pharmacy Care Issues Regarding Warfarin Labs  RECOMMENDATION (Action Taken): A baseline and daily protime for three days has been ordered to meet the [facility] Patient safety goal and comply with the current [place].   The Pharmacy will defer all warfarin dose order changes and follow up of lab results to the prescriber unless an additional order to initiate a "pharmacy Coumadin consult" is placed.  DESCRIPTION:  While hospitalized, to be in compliance with [facility] Patient Safety Goals, all patients on warfarin must have a baseline and/or current protime prior to the administration of warfarin. Pharmacy has received your order for warfarin without these required laboratory assessments. Dorrene German 10/31/2013 6:55 AM
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
## 2013-10-31 NOTE — ED Notes (Addendum)
Pt has shirt, shorts, socks, shoes, wallet (13 dollars, 2 checkcards, and misc.), bracelet, and cellphone.  Has been seen by security and wanded.
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
## 2013-10-31 NOTE — ED Provider Notes (Signed)
Medical screening examination/treatment/procedure(s) were performed by non-physician practitioner and as supervising physician I was immediately available for consultation/collaboration.   EKG Interpretation None       Deejay Koppelman K Cato Liburd-Rasch, MD 10/31/13 [phone]
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
## 2013-10-31 NOTE — Consult Note (Signed)
Face to face evaluation and I agree with this note 

## 2013-11-06 ENCOUNTER — Other Ambulatory Visit: Payer: Self-pay | Admitting: Internal Medicine

## 2013-11-06 ENCOUNTER — Other Ambulatory Visit: Payer: Self-pay | Admitting: Cardiology

## 2013-11-07 ENCOUNTER — Ambulatory Visit: Payer: Medicare Other | Admitting: Internal Medicine

## 2013-11-19 ENCOUNTER — Ambulatory Visit (INDEPENDENT_AMBULATORY_CARE_PROVIDER_SITE_OTHER): Payer: Medicare Other | Admitting: Pharmacist

## 2013-11-19 DIAGNOSIS — Z5181 Encounter for therapeutic drug level monitoring: Secondary | ICD-10-CM | POA: Diagnosis not present

## 2013-11-19 DIAGNOSIS — I4891 Unspecified atrial fibrillation: Secondary | ICD-10-CM | POA: Diagnosis not present

## 2013-11-19 DIAGNOSIS — Z8679 Personal history of other diseases of the circulatory system: Secondary | ICD-10-CM | POA: Diagnosis not present

## 2013-11-19 DIAGNOSIS — I480 Paroxysmal atrial fibrillation: Secondary | ICD-10-CM

## 2013-11-19 LAB — POCT INR: INR: 3.1

## 2013-12-10 ENCOUNTER — Ambulatory Visit (INDEPENDENT_AMBULATORY_CARE_PROVIDER_SITE_OTHER): Payer: Medicare Other

## 2013-12-10 DIAGNOSIS — Z8679 Personal history of other diseases of the circulatory system: Secondary | ICD-10-CM | POA: Diagnosis not present

## 2013-12-10 DIAGNOSIS — Z5181 Encounter for therapeutic drug level monitoring: Secondary | ICD-10-CM

## 2013-12-10 DIAGNOSIS — I4891 Unspecified atrial fibrillation: Secondary | ICD-10-CM

## 2013-12-10 DIAGNOSIS — I480 Paroxysmal atrial fibrillation: Secondary | ICD-10-CM

## 2013-12-10 LAB — POCT INR: INR: 3.4

## 2013-12-20 ENCOUNTER — Other Ambulatory Visit: Payer: Self-pay | Admitting: Pharmacist

## 2013-12-20 MED ORDER — WARFARIN SODIUM 5 MG PO TABS: ORAL_TABLET | ORAL | Status: DC

## 2013-12-31 ENCOUNTER — Ambulatory Visit (INDEPENDENT_AMBULATORY_CARE_PROVIDER_SITE_OTHER): Payer: Medicare Other | Admitting: Pharmacist Clinician (PhC)/ Clinical Pharmacy Specialist

## 2013-12-31 DIAGNOSIS — I4891 Unspecified atrial fibrillation: Secondary | ICD-10-CM

## 2013-12-31 DIAGNOSIS — I480 Paroxysmal atrial fibrillation: Secondary | ICD-10-CM

## 2013-12-31 DIAGNOSIS — Z8679 Personal history of other diseases of the circulatory system: Secondary | ICD-10-CM

## 2013-12-31 DIAGNOSIS — Z5181 Encounter for therapeutic drug level monitoring: Secondary | ICD-10-CM

## 2013-12-31 LAB — POCT INR: INR: 1.9

## 2014-01-21 ENCOUNTER — Ambulatory Visit (INDEPENDENT_AMBULATORY_CARE_PROVIDER_SITE_OTHER): Payer: Medicare Other | Admitting: Pharmacist Clinician (PhC)/ Clinical Pharmacy Specialist

## 2014-01-21 DIAGNOSIS — Z5181 Encounter for therapeutic drug level monitoring: Secondary | ICD-10-CM

## 2014-01-21 DIAGNOSIS — I4891 Unspecified atrial fibrillation: Secondary | ICD-10-CM

## 2014-01-21 DIAGNOSIS — Z8679 Personal history of other diseases of the circulatory system: Secondary | ICD-10-CM

## 2014-01-21 DIAGNOSIS — I480 Paroxysmal atrial fibrillation: Secondary | ICD-10-CM

## 2014-01-21 LAB — POCT INR: INR: 1.8

## 2014-02-07 ENCOUNTER — Other Ambulatory Visit: Payer: Self-pay | Admitting: Internal Medicine

## 2014-02-11 ENCOUNTER — Ambulatory Visit (INDEPENDENT_AMBULATORY_CARE_PROVIDER_SITE_OTHER): Payer: Medicare Other | Admitting: *Deleted

## 2014-02-11 ENCOUNTER — Telehealth: Payer: Self-pay | Admitting: *Deleted

## 2014-02-11 VITALS — BP 146/92

## 2014-02-11 DIAGNOSIS — Z8679 Personal history of other diseases of the circulatory system: Secondary | ICD-10-CM

## 2014-02-11 DIAGNOSIS — I4891 Unspecified atrial fibrillation: Secondary | ICD-10-CM | POA: Diagnosis not present

## 2014-02-11 DIAGNOSIS — Z5181 Encounter for therapeutic drug level monitoring: Secondary | ICD-10-CM

## 2014-02-11 DIAGNOSIS — I480 Paroxysmal atrial fibrillation: Secondary | ICD-10-CM

## 2014-02-11 LAB — POCT INR: INR: 2.9

## 2014-02-11 NOTE — Telephone Encounter (Signed)
Please make ROV for f/u BP and DM (last here mar 2015)

## 2014-02-21 ENCOUNTER — Ambulatory Visit (INDEPENDENT_AMBULATORY_CARE_PROVIDER_SITE_OTHER): Payer: Medicare Other | Admitting: Internal Medicine

## 2014-02-21 ENCOUNTER — Encounter: Payer: Self-pay | Admitting: Internal Medicine

## 2014-02-21 ENCOUNTER — Other Ambulatory Visit (INDEPENDENT_AMBULATORY_CARE_PROVIDER_SITE_OTHER): Payer: Medicare Other

## 2014-02-21 VITALS — BP 120/80 | HR 59 | Temp 98.1°F | Ht 74.0 in | Wt 314.4 lb

## 2014-02-21 DIAGNOSIS — I10 Essential (primary) hypertension: Secondary | ICD-10-CM | POA: Diagnosis not present

## 2014-02-21 DIAGNOSIS — E785 Hyperlipidemia, unspecified: Secondary | ICD-10-CM | POA: Diagnosis not present

## 2014-02-21 DIAGNOSIS — Z23 Encounter for immunization: Secondary | ICD-10-CM

## 2014-02-21 DIAGNOSIS — I200 Unstable angina: Secondary | ICD-10-CM

## 2014-02-21 DIAGNOSIS — E119 Type 2 diabetes mellitus without complications: Secondary | ICD-10-CM

## 2014-02-21 LAB — BASIC METABOLIC PANEL
BUN: 15 mg/dL (ref 6–23)
CO2: 26 mEq/L (ref 19–32)
Calcium: 9.4 mg/dL (ref 8.4–10.5)
Chloride: 104 mEq/L (ref 96–112)
Creatinine, Ser: 1.2 mg/dL (ref 0.4–1.5)
GFR: 82.75 mL/min (ref >60.00)
Glucose, Bld: 128 mg/dL — ABNORMAL HIGH (ref 70–99)
Potassium: 4.2 mEq/L (ref 3.5–5.1)
SODIUM: 139 meq/L (ref 135–145)

## 2014-02-21 LAB — LIPID PANEL
CHOL/HDL RATIO: 4
Cholesterol: 135 mg/dL (ref 0–200)
HDL: 31.6 mg/dL — AB (ref >39.00)
LDL CALC: 75 mg/dL (ref 0–99)
NONHDL: 103.4
Triglycerides: 140 mg/dL (ref 0.0–149.0)
VLDL: 28 mg/dL (ref 0.0–40.0)

## 2014-02-21 LAB — HEPATIC FUNCTION PANEL
ALK PHOS: 61 U/L (ref 39–117)
ALT: 23 U/L (ref 0–53)
AST: 27 U/L (ref 0–37)
Albumin: 4.1 g/dL (ref 3.5–5.2)
BILIRUBIN DIRECT: 0.2 mg/dL (ref 0.0–0.3)
Total Bilirubin: 1.1 mg/dL (ref 0.2–1.2)
Total Protein: 8.8 g/dL — ABNORMAL HIGH (ref 6.0–8.3)

## 2014-02-21 LAB — HEMOGLOBIN A1C: Hgb A1c MFr Bld: 7.2 % — ABNORMAL HIGH (ref 4.6–6.5)

## 2014-02-21 NOTE — Patient Instructions (Addendum)

## 2014-02-21 NOTE — Progress Notes (Signed)
Subjective:    Patient ID: [name], male    DOB: 10-Sep-1965, 48 y.o.   MRN: [id number]  HPI  Here to f/u; overall doing ok,  Pt denies chest pain, increased sob or doe, wheezing, orthopnea, PND, increased LE swelling, palpitations, dizziness or syncope.  Pt denies polydipsia, polyuria, or low sugar symptoms such as weakness or confusion improved with po intake.  Pt denies new neurological symptoms such as new headache, or facial or extremity weakness or numbness.   Pt states overall good compliance with meds, has been trying to follow lower cholesterol, diabetic diet, with wt overall stable,  but little exercise however. No current complaints Past Medical History  Diagnosis Date  . Diabetes mellitus   . Hypertension   . Unspecified vitamin D deficiency   . Hyperlipidemia   . H/O cardiovascular stress test     a. Myoview 2011: walked for 8min, images with EF 39%, prominent inf scar, mild reversibility. b. Cardiac CT 06/2010: no CAD, only mild plaque in prox RCA. c. EF normal by echo 2014.  [name] hematuria   . TRANSIENT ISCHEMIC ATTACK, HX OF     a. In 2004.  [name] NEPHROLITHIASIS, HX OF   . PAF (paroxysmal atrial fibrillation)     a. H/o such, with recurrence of coarse afib/flutter during ER consult 03/2012.  . Ascending aortic dissection     a. 2000 - with aortic valve involvement. S/p repair of aortic dissection with placement of mechanical AVR.  [name] Obesity   . H/O mechanical aortic valve replacement   . Cholelithiasis 08/22/2013  . CAD (coronary artery disease)    Past Surgical History  Procedure Laterality Date  . Appendectomy  1998  . Coronary artery bypass graft  2000  . Aortic valve replacement  2000  . Tee without cardioversion N/A 07/22/2013    Procedure: TRANSESOPHAGEAL ECHOCARDIOGRAM (TEE);  Surgeon: [name], MD;  Location: [facility] ENDOSCOPY;  Service: Cardiovascular;  Laterality: N/A;    reports that he has never smoked. He does not have any smokeless tobacco history  on file. He reports that he does not drink alcohol or use illicit drugs. family history includes Coronary artery disease in his mother; Coronary artery disease (age of onset: 50) in his other; Diabetes in his brother; Hypertension in his mother. Allergies  Allergen Reactions  . Insulin Glargine Swelling    edema   Current Outpatient Prescriptions on File Prior to Visit  Medication Sig Dispense Refill  . cholecalciferol (VITAMIN D) 1000 UNITS tablet Take 1,000 Units by mouth daily.      . cloNIDine (CATAPRES) 0.1 MG tablet Take 1 tablet (0.1 mg total) by mouth 2 (two) times daily.  60 tablet  4  . clopidogrel (PLAVIX) 75 MG tablet Take 1 tablet (75 mg total) by mouth daily with breakfast.  30 tablet  0  . furosemide (LASIX) 40 MG tablet Take 1 tablet (40 mg total) by mouth 2 (two) times daily.  60 tablet  11  . glimepiride (AMARYL) 1 MG tablet take 1 tablet by mouth once daily BEFORE BREAKFAST  90 tablet  3  . glucose blood test strip 1 per day - Use as instructed  100 each  12  . Lancets MISC Use 1 per day as directed  100 each  12  . levothyroxine (SYNTHROID, LEVOTHROID) 50 MCG tablet Take 1 tablet (50 mcg total) by mouth daily.  90 tablet  3  . losartan (COZAAR) 100 MG tablet Take 100 mg  by mouth daily.      [name] losartan (COZAAR) 100 MG tablet take 1 tablet by mouth once daily  30 tablet  10  . metFORMIN (GLUCOPHAGE) 1000 MG tablet Take 1,000 mg by mouth 2 (two) times daily with a meal.      . metFORMIN (GLUCOPHAGE) 1000 MG tablet take 1 tablet by mouth twice a day with food  60 tablet  5  . metoprolol (TOPROL-XL) 200 MG 24 hr tablet Take 1 tablet (200 mg total) by mouth daily.  60 tablet  6  . nitroGLYCERIN (NITROSTAT) 0.4 MG SL tablet Place 1 tablet (0.4 mg total) under the tongue every 5 (five) minutes x 3 doses as needed for chest pain.  25 tablet  2  . potassium chloride SA (K-DUR,KLOR-CON) 20 MEQ tablet Take 20 mEq by mouth 2 (two) times daily.      . potassium chloride SA  (K-DUR,KLOR-CON) 20 MEQ tablet take 2 tablets by mouth once daily  60 tablet  5  . pravastatin (PRAVACHOL) 40 MG tablet Take 40 mg by mouth every morning.       . pravastatin (PRAVACHOL) 40 MG tablet take 1 tablet by mouth once daily  30 tablet  5  . warfarin (COUMADIN) 5 MG tablet Take as directed by Anticoagulation clinic  35 tablet  3   No current facility-administered medications on file prior to visit.   Review of Systems  Constitutional: Negative for unusual diaphoresis or other sweats  HENT: Negative for ringing in ear Eyes: Negative for double vision or worsening visual disturbance.  Respiratory: Negative for choking and stridor.   Gastrointestinal: Negative for vomiting or other signifcant bowel change Genitourinary: Negative for hematuria or decreased urine volume.  Musculoskeletal: Negative for other MSK pain or swelling Skin: Negative for color change and worsening wound.  Neurological: Negative for tremors and numbness other than noted  Psychiatric/Behavioral: Negative for decreased concentration or agitation other than above       Objective:   Physical Exam BP 120/80  Pulse 59  Temp(Src) 98.1 F (36.7 C) (Oral)  Ht 6\' 2"  (1.88 m)  Wt 314 lb 6 oz (142.6 kg)  BMI 40.35 kg/m2  SpO2 98% VS noted,  Constitutional: Pt appears well-developed, well-nourished.  HENT: Head: NCAT.  Right Ear: External ear normal.  Left Ear: External ear normal.  Eyes: . Pupils are equal, round, and reactive to light. Conjunctivae and EOM are normal Neck: Normal range of motion. Neck supple.  Cardiovascular: Normal rate and regular rhythm.   Pulmonary/Chest: Effort normal and breath sounds normal.  Neurological: Pt is alert. Not confused , motor grossly intact Skin: Skin is warm. No rash Psychiatric: Pt behavior is normal. No agitation.     Assessment & Plan:

## 2014-02-21 NOTE — Progress Notes (Signed)
Pre visit review using our clinic review tool, if applicable. No additional management support is needed unless otherwise documented below in the visit note. 

## 2014-02-23 NOTE — Assessment & Plan Note (Signed)
stable overall by history and exam, recent data reviewed with pt, and pt to continue medical treatment as before,  to f/u any worsening symptoms or concerns Lab Results  Component Value Date   LDLCALC 75 02/21/2014

## 2014-02-23 NOTE — Assessment & Plan Note (Signed)
stable overall by history and exam, recent data reviewed with pt, and pt to continue medical treatment as before,  to f/u any worsening symptoms or concerns BP Readings from Last 3 Encounters:  02/21/14 120/80  02/11/14 146/92  10/31/13 149/99

## 2014-02-23 NOTE — Assessment & Plan Note (Signed)
?   Control, stable overall by history and exam, recent data reviewed with pt, and pt to continue medical treatment as before,  to f/u any worsening symptoms or concerns, for f/u lab Lab Results  Component Value Date   HGBA1C 7.2* 02/21/2014

## 2014-03-04 ENCOUNTER — Ambulatory Visit (INDEPENDENT_AMBULATORY_CARE_PROVIDER_SITE_OTHER): Payer: Medicare Other | Admitting: *Deleted

## 2014-03-04 DIAGNOSIS — Z5181 Encounter for therapeutic drug level monitoring: Secondary | ICD-10-CM | POA: Diagnosis not present

## 2014-03-04 DIAGNOSIS — I480 Paroxysmal atrial fibrillation: Secondary | ICD-10-CM | POA: Diagnosis not present

## 2014-03-04 LAB — POCT INR: INR: 1.7

## 2014-03-19 ENCOUNTER — Ambulatory Visit (INDEPENDENT_AMBULATORY_CARE_PROVIDER_SITE_OTHER): Payer: Medicare Other | Admitting: *Deleted

## 2014-03-19 DIAGNOSIS — I480 Paroxysmal atrial fibrillation: Secondary | ICD-10-CM

## 2014-03-19 DIAGNOSIS — Z5181 Encounter for therapeutic drug level monitoring: Secondary | ICD-10-CM | POA: Diagnosis not present

## 2014-03-19 LAB — POCT INR: INR: 2.3

## 2014-04-09 ENCOUNTER — Ambulatory Visit (INDEPENDENT_AMBULATORY_CARE_PROVIDER_SITE_OTHER): Payer: Medicare Other | Admitting: *Deleted

## 2014-04-09 DIAGNOSIS — I359 Nonrheumatic aortic valve disorder, unspecified: Secondary | ICD-10-CM

## 2014-04-09 DIAGNOSIS — Z5181 Encounter for therapeutic drug level monitoring: Secondary | ICD-10-CM

## 2014-04-09 DIAGNOSIS — Z8679 Personal history of other diseases of the circulatory system: Secondary | ICD-10-CM

## 2014-04-09 DIAGNOSIS — I4891 Unspecified atrial fibrillation: Secondary | ICD-10-CM

## 2014-04-09 DIAGNOSIS — G459 Transient cerebral ischemic attack, unspecified: Secondary | ICD-10-CM | POA: Diagnosis not present

## 2014-04-09 LAB — POCT INR: INR: 2.4

## 2014-04-18 ENCOUNTER — Emergency Department (HOSPITAL_COMMUNITY)
Admission: EM | Admit: 2014-04-18 | Discharge: 2014-04-18 | Disposition: A | Discharge status: Home / Self Care | Payer: Medicare Other | Attending: Emergency Medicine | Admitting: Emergency Medicine

## 2014-04-18 ENCOUNTER — Emergency Department (HOSPITAL_COMMUNITY): Payer: Medicare Other

## 2014-04-18 ENCOUNTER — Encounter (HOSPITAL_COMMUNITY): Payer: Self-pay

## 2014-04-18 DIAGNOSIS — Z79899 Other long term (current) drug therapy: Secondary | ICD-10-CM | POA: Insufficient documentation

## 2014-04-18 DIAGNOSIS — E785 Hyperlipidemia, unspecified: Secondary | ICD-10-CM | POA: Insufficient documentation

## 2014-04-18 DIAGNOSIS — I2510 Atherosclerotic heart disease of native coronary artery without angina pectoris: Secondary | ICD-10-CM | POA: Diagnosis not present

## 2014-04-18 DIAGNOSIS — Z792 Long term (current) use of antibiotics: Secondary | ICD-10-CM | POA: Insufficient documentation

## 2014-04-18 DIAGNOSIS — J40 Bronchitis, not specified as acute or chronic: Secondary | ICD-10-CM

## 2014-04-18 DIAGNOSIS — Z7901 Long term (current) use of anticoagulants: Secondary | ICD-10-CM | POA: Insufficient documentation

## 2014-04-18 DIAGNOSIS — I480 Paroxysmal atrial fibrillation: Secondary | ICD-10-CM | POA: Insufficient documentation

## 2014-04-18 DIAGNOSIS — E669 Obesity, unspecified: Secondary | ICD-10-CM | POA: Insufficient documentation

## 2014-04-18 DIAGNOSIS — J209 Acute bronchitis, unspecified: Secondary | ICD-10-CM | POA: Insufficient documentation

## 2014-04-18 DIAGNOSIS — E119 Type 2 diabetes mellitus without complications: Secondary | ICD-10-CM | POA: Diagnosis not present

## 2014-04-18 DIAGNOSIS — Z8673 Personal history of transient ischemic attack (TIA), and cerebral infarction without residual deficits: Secondary | ICD-10-CM | POA: Diagnosis not present

## 2014-04-18 DIAGNOSIS — I10 Essential (primary) hypertension: Secondary | ICD-10-CM | POA: Insufficient documentation

## 2014-04-18 DIAGNOSIS — R05 Cough: Secondary | ICD-10-CM | POA: Diagnosis not present

## 2014-04-18 DIAGNOSIS — I517 Cardiomegaly: Secondary | ICD-10-CM | POA: Diagnosis not present

## 2014-04-18 DIAGNOSIS — R1110 Vomiting, unspecified: Secondary | ICD-10-CM | POA: Insufficient documentation

## 2014-04-18 DIAGNOSIS — R0602 Shortness of breath: Secondary | ICD-10-CM | POA: Diagnosis not present

## 2014-04-18 LAB — CBC
HCT: 35.9 % — ABNORMAL LOW (ref 39.0–52.0)
Hemoglobin: 12.6 g/dL — ABNORMAL LOW (ref 13.0–17.0)
MCH: 28.8 pg (ref 26.0–34.0)
MCHC: 35.1 g/dL (ref 30.0–36.0)
MCV: 82 fL (ref 78.0–100.0)
PLATELETS: 150 10*3/uL (ref 150–400)
RBC: 4.38 MIL/uL (ref 4.22–5.81)
RDW: 14.6 % (ref 11.5–15.5)
WBC: 5.4 10*3/uL (ref 4.0–10.5)

## 2014-04-18 LAB — BASIC METABOLIC PANEL
Anion gap: 13 (ref 5–15)
BUN: 15 mg/dL (ref 6–23)
CALCIUM: 9.2 mg/dL (ref 8.4–10.5)
CO2: 25 meq/L (ref 19–32)
Chloride: 102 mEq/L (ref 96–112)
Creatinine, Ser: 1 mg/dL (ref 0.50–1.35)
GFR calc Af Amer: >90 mL/min (ref >90)
GFR calc non Af Amer: 87 mL/min — ABNORMAL LOW (ref >90)
Glucose, Bld: 210 mg/dL — ABNORMAL HIGH (ref 70–99)
POTASSIUM: 3.9 meq/L (ref 3.7–5.3)
Sodium: 140 mEq/L (ref 137–147)

## 2014-04-18 LAB — PRO B NATRIURETIC PEPTIDE: PRO B NATRI PEPTIDE: 616.8 pg/mL — AB (ref 0–125)

## 2014-04-18 LAB — I-STAT TROPONIN, ED: Troponin i, poc: 0 ng/mL (ref 0.00–0.08)

## 2014-04-18 LAB — PROTIME-INR
INR: 1.45 (ref 0.00–1.49)
Prothrombin Time: 17.8 seconds — ABNORMAL HIGH (ref 11.6–15.2)

## 2014-04-18 LAB — TROPONIN I

## 2014-04-18 IMAGING — CR DG CHEST 1V PORT
1 series · 1 of 1 positions shown · non-contrast
Comparison: None.

CLINICAL DATA: 48-year-old with shortness of breath, emesis x2
days, cough

EXAM:
PORTABLE CHEST - 1 VIEW

[AP]
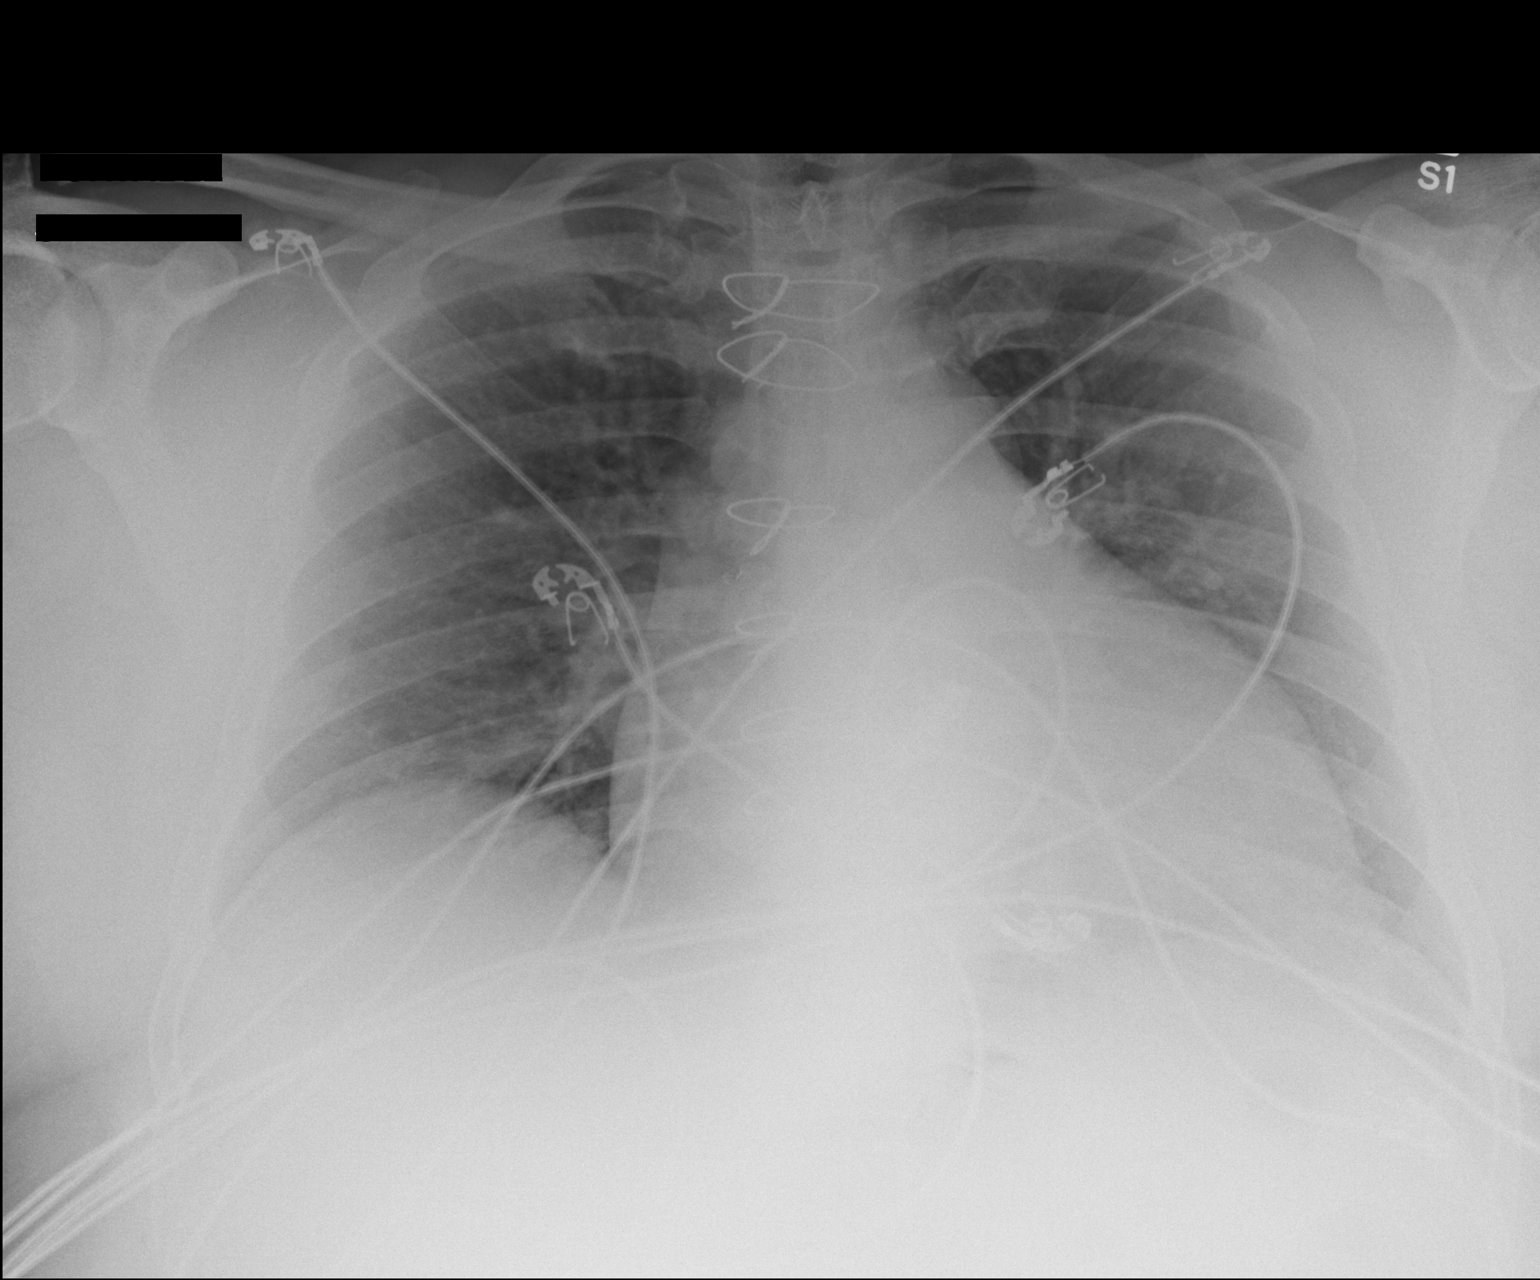

[1 of 1 positions shown; findings below may reference images not displayed]

FINDINGS: An aortic valve replacement is present. Median sternotomy wires are
noted. The heart size is enlarged. The mediastinal contours are
within normal limits.

The lung volumes are diminished. There is no focal airspace
consolidation, pleural effusion or pneumothorax. There is no overt
pulmonary edema.

No acute osseous abnormality is identified.
IMPRESSION: Cardiomegaly without overt pulmonary edema or focal airspace
consolidation.

## 2014-04-18 MED ORDER — FUROSEMIDE 10 MG/ML IJ SOLN
40.0000 mg | Freq: Once | INTRAMUSCULAR | Status: AC
  Administered 2014-04-18: 40 mg via INTRAVENOUS
  Filled 2014-04-18: qty 4

## 2014-04-18 MED ORDER — BENZONATATE 100 MG PO CAPS: 100.0000 mg | ORAL_CAPSULE | Freq: Three times a day (TID) | ORAL | Status: DC

## 2014-04-18 MED ORDER — AZITHROMYCIN 250 MG PO TABS: ORAL_TABLET | ORAL | Status: DC

## 2014-04-18 NOTE — Discharge Instructions (Signed)
Upper Respiratory Infection, Adult An upper respiratory infection (URI) is also sometimes known as the common cold. The upper respiratory tract includes the nose, sinuses, throat, trachea, and bronchi. Bronchi are the airways leading to the lungs. Most people improve within 1 week, but symptoms can last up to 2 weeks. A residual cough may last even longer.  CAUSES Many different viruses can infect the tissues lining the upper respiratory tract. The tissues become irritated and inflamed and often become very moist. Mucus production is also common. A cold is contagious. You can easily spread the virus to others by oral contact. This includes kissing, sharing a glass, coughing, or sneezing. Touching your mouth or nose and then touching a surface, which is then touched by another person, can also spread the virus. SYMPTOMS  Symptoms typically develop 1 to 3 days after you come in contact with a cold virus. Symptoms vary from person to person. They may include:  Runny nose.  Sneezing.  Nasal congestion.  Sinus irritation.  Sore throat.  Loss of voice (laryngitis).  Cough.  Fatigue.  Muscle aches.  Loss of appetite.  Headache.  Low-grade fever. DIAGNOSIS  You might diagnose your own cold based on familiar symptoms, since most people get a cold 2 to 3 times a year. Your caregiver can confirm this based on your exam. Most importantly, your caregiver can check that your symptoms are not due to another disease such as strep throat, sinusitis, pneumonia, asthma, or epiglottitis. Blood tests, throat tests, and X-rays are not necessary to diagnose a common cold, but they may sometimes be helpful in excluding other more serious diseases. Your caregiver will decide if any further tests are required. RISKS AND COMPLICATIONS  You may be at risk for a more severe case of the common cold if you smoke cigarettes, have chronic heart disease (such as heart failure) or lung disease (such as asthma), or if  you have a weakened immune system. The very young and very old are also at risk for more serious infections. Bacterial sinusitis, middle ear infections, and bacterial pneumonia can complicate the common cold. The common cold can worsen asthma and chronic obstructive pulmonary disease (COPD). Sometimes, these complications can require emergency medical care and may be life-threatening. PREVENTION  The best way to protect against getting a cold is to practice good hygiene. Avoid oral or hand contact with people with cold symptoms. Wash your hands often if contact occurs. There is no clear evidence that vitamin C, vitamin E, echinacea, or exercise reduces the chance of developing a cold. However, it is always recommended to get plenty of rest and practice good nutrition. TREATMENT  Treatment is directed at relieving symptoms. There is no cure. Antibiotics are not effective, because the infection is caused by a virus, not by bacteria. Treatment may include:  Increased fluid intake. Sports drinks offer valuable electrolytes, sugars, and fluids.  Breathing heated mist or steam (vaporizer or shower).  Eating chicken soup or other clear broths, and maintaining good nutrition.  Getting plenty of rest.  Using gargles or lozenges for comfort.  Controlling fevers with ibuprofen or acetaminophen as directed by your caregiver.  Increasing usage of your inhaler if you have asthma. Zinc gel and zinc lozenges, taken in the first 24 hours of the common cold, can shorten the duration and lessen the severity of symptoms. Pain medicines may help with fever, muscle aches, and throat pain. A variety of non-prescription medicines are available to treat congestion and runny nose. Your caregiver   can make recommendations and may suggest nasal or lung inhalers for other symptoms.  HOME CARE INSTRUCTIONS   Only take over-the-counter or prescription medicines for pain, discomfort, or fever as directed by your  caregiver.  Use a warm mist humidifier or inhale steam from a shower to increase air moisture. This may keep secretions moist and make it easier to breathe.  Drink enough water and fluids to keep your urine clear or pale yellow.  Rest as needed.  Return to work when your temperature has returned to normal or as your caregiver advises. You may need to stay home longer to avoid infecting others. You can also use a face mask and careful hand washing to prevent spread of the virus. SEEK MEDICAL CARE IF:   After the first few days, you feel you are getting worse rather than better.  You need your caregiver's advice about medicines to control symptoms.  You develop chills, worsening shortness of breath, or brown or red sputum. These may be signs of pneumonia.  You develop yellow or brown nasal discharge or pain in the face, especially when you bend forward. These may be signs of sinusitis.  You develop a fever, swollen neck glands, pain with swallowing, or white areas in the back of your throat. These may be signs of strep throat. SEEK IMMEDIATE MEDICAL CARE IF:   You have a fever.  You develop severe or persistent headache, ear pain, sinus pain, or chest pain.  You develop wheezing, a prolonged cough, cough up blood, or have a change in your usual mucus (if you have chronic lung disease).  You develop sore muscles or a stiff neck. Document Released: 11/02/2000 Document Revised: 08/01/2011 Document Reviewed: 08/14/2013 [employer] Patient Information 2015 [employer]. This information is not intended to replace advice given to you by your health care provider. Make sure you discuss any questions you have with your health care provider.  

## 2014-04-18 NOTE — ED Provider Notes (Signed)
CSN: [id number]     Arrival date & time 04/18/14  9629 History   First MD Initiated Contact with Patient 04/18/14 0857     Chief Complaint  Patient presents with  . Shortness of Breath  . Emesis     (Consider location/radiation/quality/duration/timing/severity/associated sxs/prior Treatment) HPI Comments: Patient presents with cough and shortness of breath. He states he's been feeling bad for 1 week with worsening cough and chest congestion. He has been coughing up yellow sputum. He's had some shortness of breath. He denies any chest pain or discomfort. He's had some posttussive emesis. He's had a slight amount of pedal edema which is increased from his baseline. He denies any fevers or chills. He denies any runny nose or nasal congestion. He does have a history of diabetes hypertension and coronary artery disease with a past aortic dissection with repair of the descending aortic dissection as well as aortic valve replacement with a mechanical valve in 2000. He's on Coumadin. He has a history of paroxysmal atrial fibrillation.  He was admitted with SOB in Feb 2015 and had a stent placed.  Patient is a 48 y.o. male presenting with shortness of breath and vomiting.  Shortness of Breath Associated symptoms: cough and vomiting (post tussive)   Associated symptoms: no abdominal pain, no chest pain, no diaphoresis, no fever, no headaches and no rash   Emesis Associated symptoms: no abdominal pain, no arthralgias, no chills, no diarrhea and no headaches     Past Medical History  Diagnosis Date  . Diabetes mellitus   . Hypertension   . Unspecified vitamin D deficiency   . Hyperlipidemia   . H/O cardiovascular stress test     a. Myoview 2011: walked for 66min, images with EF 39%, prominent inf scar, mild reversibility. b. Cardiac CT 06/2010: no CAD, only mild plaque in prox RCA. c. EF normal by echo 2014.  [name] hematuria   . TRANSIENT ISCHEMIC ATTACK, HX OF     a. In 2004.  [name]  NEPHROLITHIASIS, HX OF   . PAF (paroxysmal atrial fibrillation)     a. H/o such, with recurrence of coarse afib/flutter during ER consult 03/2012.  . Ascending aortic dissection     a. 2000 - with aortic valve involvement. S/p repair of aortic dissection with placement of mechanical AVR.  [name] Obesity   . H/O mechanical aortic valve replacement   . Cholelithiasis 08/22/2013  . CAD (coronary artery disease)    Past Surgical History  Procedure Laterality Date  . Appendectomy  1998  . Coronary artery bypass graft  2000  . Aortic valve replacement  2000  . Tee without cardioversion N/A 07/22/2013    Procedure: TRANSESOPHAGEAL ECHOCARDIOGRAM (TEE);  Surgeon: [name], MD;  Location: [facility]COPY;  Service: Cardiovascular;  Laterality: N/A;   Family History  Problem Relation Age of Onset  . Coronary artery disease Mother   . Hypertension Mother   . Diabetes Brother   . Coronary artery disease Other 47    male 1st degree relative, CABG in his 59's (father)   History  Substance Use Topics  . Smoking status: Never Smoker   . Smokeless tobacco: Not on file  . Alcohol Use: No    Review of Systems  Constitutional: Positive for fatigue. Negative for fever, chills and diaphoresis.  HENT: Negative for congestion, rhinorrhea and sneezing.   Eyes: Negative.   Respiratory: Positive for cough and shortness of breath. Negative for chest tightness.   Cardiovascular: Positive for leg  swelling. Negative for chest pain.  Gastrointestinal: Positive for vomiting (post tussive). Negative for nausea, abdominal pain, diarrhea and blood in stool.  Genitourinary: Negative for frequency, hematuria, flank pain and difficulty urinating.  Musculoskeletal: Negative for back pain and arthralgias.  Skin: Negative for rash.  Neurological: Negative for dizziness, speech difficulty, weakness, numbness and headaches.      Allergies  Insulin glargine  Home Medications   Prior to Admission medications    Medication Sig Start Date End Date Taking? Authorizing Provider  cholecalciferol (VITAMIN D) 1000 UNITS tablet Take 1,000 Units by mouth daily.   Yes Historical Provider, MD  cloNIDine (CATAPRES) 0.1 MG tablet Take 1 tablet (0.1 mg total) by mouth 2 (two) times daily.   Yes Lelon Perla, MD  furosemide (LASIX) 40 MG tablet Take 1 tablet (40 mg total) by mouth 2 (two) times daily. 07/25/13  Yes Luke K Kilroy, PA-C  glimepiride (AMARYL) 1 MG tablet Take 1 mg by mouth daily before breakfast.   Yes Historical Provider, MD  glucose blood test strip 1 per day - Use as instructed Patient taking differently: 1 each by Other route daily.  08/30/13  Yes Biagio Borg, MD  Lancets MISC Use 1 per day as directed 08/22/13  Yes Biagio Borg, MD  levothyroxine (SYNTHROID, LEVOTHROID) 50 MCG tablet Take 1 tablet (50 mcg total) by mouth daily. 08/22/13  Yes Biagio Borg, MD  losartan (COZAAR) 100 MG tablet Take 100 mg by mouth daily.   Yes Historical Provider, MD  metFORMIN (GLUCOPHAGE) 1000 MG tablet Take 1,000 mg by mouth 2 (two) times daily with a meal.   Yes Historical Provider, MD  metoprolol (TOPROL-XL) 200 MG 24 hr tablet Take 1 tablet (200 mg total) by mouth daily. 07/31/13  Yes Burtis Junes, NP  nitroGLYCERIN (NITROSTAT) 0.4 MG SL tablet Place 1 tablet (0.4 mg total) under the tongue every 5 (five) minutes x 3 doses as needed for chest pain. 07/25/13  Yes Luke K Kilroy, PA-C  potassium chloride SA (K-DUR,KLOR-CON) 20 MEQ tablet Take 20 mEq by mouth 2 (two) times daily.   Yes Historical Provider, MD  pravastatin (PRAVACHOL) 40 MG tablet Take 40 mg by mouth every morning.    Yes Historical Provider, MD  warfarin (COUMADIN) 5 MG tablet Take as directed by Anticoagulation clinic Patient taking differently: Take 5-15 mg by mouth See admin instructions. 15mg  on Wednesdays and Fridays. 5mg  all other days. 12/20/13  Yes Deboraha Sprang, MD  azithromycin (ZITHROMAX Z-PAK) 250 MG tablet 2 po day one, then 1 daily x 4  days 04/18/14   Malvin Johns, MD  benzonatate (TESSALON) 100 MG capsule Take 1 capsule (100 mg total) by mouth every 8 (eight) hours. 04/18/14   Malvin Johns, MD  clopidogrel (PLAVIX) 75 MG tablet Take 1 tablet (75 mg total) by mouth daily with breakfast. Patient not taking: Reported on 04/18/2014 07/25/13   Erlene Quan, PA-C  glimepiride (AMARYL) 1 MG tablet take 1 tablet by mouth once daily BEFORE BREAKFAST Patient not taking: Reported on 04/18/2014 07/25/13   Biagio Borg, MD  losartan (COZAAR) 100 MG tablet take 1 tablet by mouth once daily Patient not taking: Reported on 04/18/2014    Biagio Borg, MD  metFORMIN (GLUCOPHAGE) 1000 MG tablet take 1 tablet by mouth twice a day with food Patient not taking: Reported on 04/18/2014 02/10/14   Biagio Borg, MD  potassium chloride SA (K-DUR,KLOR-CON) 20 MEQ tablet take 2 tablets by  mouth once daily Patient not taking: Reported on 04/18/2014 02/10/14   Biagio Borg, MD  pravastatin (PRAVACHOL) 40 MG tablet take 1 tablet by mouth once daily Patient not taking: Reported on 04/18/2014 02/10/14   Biagio Borg, MD   BP 167/99 mmHg  Pulse 57  Temp(Src) 97.9 F (36.6 C) (Oral)  Resp 16  SpO2 99% Physical Exam  Constitutional: He is oriented to person, place, and time. He appears well-developed and well-nourished.  HENT:  Head: Normocephalic and atraumatic.  Eyes: Pupils are equal, round, and reactive to light.  Neck: Normal range of motion. Neck supple.  Cardiovascular: Normal rate, regular rhythm and normal heart sounds.   Pulmonary/Chest: Effort normal and breath sounds normal. No respiratory distress. He has no wheezes. He has no rales. He exhibits no tenderness.  Abdominal: Soft. Bowel sounds are normal. There is no tenderness. There is no rebound and no guarding.  Musculoskeletal: Normal range of motion. He exhibits edema (trace bilateral pitting edema).  Lymphadenopathy:    He has no cervical adenopathy.  Neurological: He is alert and  oriented to person, place, and time.  Skin: Skin is warm and dry. No rash noted.  Psychiatric: He has a normal mood and affect.    ED Course  Procedures (including critical care time) Labs Review Labs Reviewed  BASIC METABOLIC PANEL - Abnormal; Notable for the following:    Glucose, Bld 210 (*)    GFR calc non Af Amer 87 (*)    All other components within normal limits  CBC - Abnormal; Notable for the following:    Hemoglobin 12.6 (*)    HCT 35.9 (*)    All other components within normal limits  PRO B NATRIURETIC PEPTIDE - Abnormal; Notable for the following:    Pro B Natriuretic peptide (BNP) 616.8 (*)    All other components within normal limits  PROTIME-INR - Abnormal; Notable for the following:    Prothrombin Time 17.8 (*)    All other components within normal limits  TROPONIN I  Randolm Idol, ED    Imaging Review Dg Chest Port 1 View  04/18/2014   CLINICAL DATA:  48 year old with shortness of breath, emesis x2 days, cough  EXAM: PORTABLE CHEST - 1 VIEW  COMPARISON:  None.  FINDINGS: An aortic valve replacement is present. Median sternotomy wires are noted. The heart size is enlarged. The mediastinal contours are within normal limits.  The lung volumes are diminished. There is no focal airspace consolidation, pleural effusion or pneumothorax. There is no overt pulmonary edema.  No acute osseous abnormality is identified.  IMPRESSION: Cardiomegaly without overt pulmonary edema or focal airspace consolidation.   Electronically Signed   By: Rosemarie Ax   On: 04/18/2014 09:52     EKG Interpretation   Date/Time:  Friday April 18 2014 11:55:37 EST Ventricular Rate:  59 PR Interval:  143 QRS Duration: 96 QT Interval:  472 QTC Calculation: 468 R Axis:   -10 Text Interpretation:  Sinus rhythm Nonspecific T abnormalities, inferior  leads similar to prior EKG Confirmed by Makaela Cando  MD, Gerene Nedd ([id number]) on  04/18/2014 2:32:31 PM      Date: 04/18/2014  Rate: 59   Rhythm: normal sinus rhythm  QRS Axis: normal  Intervals: normal  ST/T Wave abnormalities: nonspecific ST/T changes  Conduction Disutrbances:none  Narrative Interpretation:   Old EKG Reviewed: unchanged, repeat EKG unchanged   MDM   Final diagnoses:  Bronchitis    Patient presents with cough and chest congestion.  He symptoms seem to be more consistent with an upper respiratory infection now better than acute coronary syndrome or CHF. He has central chest congestion and yellow sputum production. He has some mild pedal edema but no change in his orthopnea or exertional symptoms. He has no ischemic changes on EKG. He has a negative delta troponin. He has no pulmonary edema on chest x-ray. His BNP is mildly elevated but he has no other symptoms now be suggestive of a CHF exacerbation. I did give him a dose of Lasix in the ED. He has no tachypnea or increased work of breathing. He was discharged home in good condition. I gave him a prescription for Zithromax as well as Tessalon Perles. I encouraged him to have close follow-up with his primary care physician or return here if he has any worsening symptoms over the weekend.    Malvin Johns, MD 04/18/14 [phone]

## 2014-04-18 NOTE — ED Notes (Signed)
Pt from home, he has been experiencing shortness of breath since yesterday. He also reports he has had several episodes of emesis. Respirations equal and unlabored.

## 2014-04-23 ENCOUNTER — Emergency Department (HOSPITAL_COMMUNITY)
Admission: EM | Admit: 2014-04-23 | Discharge: 2014-04-23 | Disposition: A | Discharge status: Home / Self Care | Payer: Medicare Other | Attending: Emergency Medicine | Admitting: Emergency Medicine

## 2014-04-23 ENCOUNTER — Encounter (HOSPITAL_COMMUNITY): Payer: Self-pay | Admitting: *Deleted

## 2014-04-23 DIAGNOSIS — I10 Essential (primary) hypertension: Secondary | ICD-10-CM | POA: Diagnosis not present

## 2014-04-23 DIAGNOSIS — E785 Hyperlipidemia, unspecified: Secondary | ICD-10-CM | POA: Diagnosis not present

## 2014-04-23 DIAGNOSIS — E669 Obesity, unspecified: Secondary | ICD-10-CM | POA: Insufficient documentation

## 2014-04-23 DIAGNOSIS — Z7901 Long term (current) use of anticoagulants: Secondary | ICD-10-CM | POA: Insufficient documentation

## 2014-04-23 DIAGNOSIS — Z79899 Other long term (current) drug therapy: Secondary | ICD-10-CM | POA: Insufficient documentation

## 2014-04-23 DIAGNOSIS — M79671 Pain in right foot: Secondary | ICD-10-CM

## 2014-04-23 DIAGNOSIS — E119 Type 2 diabetes mellitus without complications: Secondary | ICD-10-CM | POA: Insufficient documentation

## 2014-04-23 DIAGNOSIS — Z8673 Personal history of transient ischemic attack (TIA), and cerebral infarction without residual deficits: Secondary | ICD-10-CM | POA: Diagnosis not present

## 2014-04-23 DIAGNOSIS — I2510 Atherosclerotic heart disease of native coronary artery without angina pectoris: Secondary | ICD-10-CM | POA: Insufficient documentation

## 2014-04-23 DIAGNOSIS — Z792 Long term (current) use of antibiotics: Secondary | ICD-10-CM | POA: Diagnosis not present

## 2014-04-23 DIAGNOSIS — I480 Paroxysmal atrial fibrillation: Secondary | ICD-10-CM | POA: Diagnosis not present

## 2014-04-23 MED ORDER — OXYCODONE-ACETAMINOPHEN 5-325 MG PO TABS: 2.0000 | ORAL_TABLET | ORAL | Status: DC | PRN

## 2014-04-23 MED ORDER — COLCHICINE 0.6 MG PO TABS: 0.6000 mg | ORAL_TABLET | Freq: Every day | ORAL | Status: DC

## 2014-04-23 NOTE — ED Notes (Signed)
The pt is c/o rt foot and ankle swollen for 2 days.  He is also c/o pain.  No known injury

## 2014-04-23 NOTE — Discharge Instructions (Signed)
Your pain is likely due to gout. Please take medication as prescribed. Follow up closely with Dr. for further evaluation of foot pain. Return if you develop fever, rash, numbness or any other concern.  Gout Gout is an inflammatory arthritis caused by a buildup of uric acid crystals in the joints. Uric acid is a chemical that is normally present in the blood. When the level of uric acid in the blood is too high it can form crystals that deposit in your joints and tissues. This causes joint redness, soreness, and swelling (inflammation). Repeat attacks are common. Over time, uric acid crystals can form into masses (tophi) near a joint, destroying bone and causing disfigurement. Gout is treatable and often preventable. CAUSES  The disease begins with elevated levels of uric acid in the blood. Uric acid is produced by your body when it breaks down a naturally found substance called purines. Certain foods you eat, such as meats and fish, contain high amounts of purines. Causes of an elevated uric acid level include:  Being passed down from parent to child (heredity).  Diseases that cause increased uric acid production (such as obesity, psoriasis, and certain cancers).  Excessive alcohol use.  Diet, especially diets rich in meat and seafood.  Medicines, including certain cancer-fighting medicines (chemotherapy), water pills (diuretics), and aspirin.  Chronic kidney disease. The kidneys are no longer able to remove uric acid well.  Problems with metabolism. Conditions strongly associated with gout include:  Obesity.  High blood pressure.  High cholesterol.  Diabetes. Not everyone with elevated uric acid levels gets gout. It is not understood why some people get gout and others do not. Surgery, joint injury, and eating too much of certain foods are some of the factors that can lead to gout attacks. SYMPTOMS   An attack of gout comes on quickly. It causes intense pain with redness, swelling,  and warmth in a joint.  Fever can occur.  Often, only one joint is involved. Certain joints are more commonly involved:  Base of the big toe.  Knee.  Ankle.  Wrist.  Finger. Without treatment, an attack usually goes away in a few days to weeks. Between attacks, you usually will not have symptoms, which is different from many other forms of arthritis. DIAGNOSIS  Your caregiver will suspect gout based on your symptoms and exam. In some cases, tests may be recommended. The tests may include:  Blood tests.  Urine tests.  X-rays.  Joint fluid exam. This exam requires a needle to remove fluid from the joint (arthrocentesis). Using a microscope, gout is confirmed when uric acid crystals are seen in the joint fluid. TREATMENT  There are two phases to gout treatment: treating the sudden onset (acute) attack and preventing attacks (prophylaxis).  Treatment of an Acute Attack.  Medicines are used. These include anti-inflammatory medicines or steroid medicines.  An injection of steroid medicine into the affected joint is sometimes necessary.  The painful joint is rested. Movement can worsen the arthritis.  You may use warm or cold treatments on painful joints, depending which works best for you.  Treatment to Prevent Attacks.  If you suffer from frequent gout attacks, your caregiver may advise preventive medicine. These medicines are started after the acute attack subsides. These medicines either help your kidneys eliminate uric acid from your body or decrease your uric acid production. You may need to stay on these medicines for a very long time.  The early phase of treatment with preventive medicine can be associated  with an increase in acute gout attacks. For this reason, during the first few months of treatment, your caregiver may also advise you to take medicines usually used for acute gout treatment. Be sure you understand your caregiver's directions. Your caregiver may make  several adjustments to your medicine dose before these medicines are effective.  Discuss dietary treatment with your caregiver or dietitian. Alcohol and drinks high in sugar and fructose and foods such as meat, poultry, and seafood can increase uric acid levels. Your caregiver or dietitian can advise you on drinks and foods that should be limited. HOME CARE INSTRUCTIONS   Do not take aspirin to relieve pain. This raises uric acid levels.  Only take over-the-counter or prescription medicines for pain, discomfort, or fever as directed by your caregiver.  Rest the joint as much as possible. When in bed, keep sheets and blankets off painful areas.  Keep the affected joint raised (elevated).  Apply warm or cold treatments to painful joints. Use of warm or cold treatments depends on which works best for you.  Use crutches if the painful joint is in your leg.  Drink enough fluids to keep your urine clear or pale yellow. This helps your body get rid of uric acid. Limit alcohol, sugary drinks, and fructose drinks.  Follow your dietary instructions. Pay careful attention to the amount of protein you eat. Your daily diet should emphasize fruits, vegetables, whole grains, and fat-free or low-fat milk products. Discuss the use of coffee, vitamin C, and cherries with your caregiver or dietitian. These may be helpful in lowering uric acid levels.  Maintain a healthy body weight. SEEK MEDICAL CARE IF:   You develop diarrhea, vomiting, or any side effects from medicines.  You do not feel better in 24 hours, or you are getting worse. SEEK IMMEDIATE MEDICAL CARE IF:   Your joint becomes suddenly more tender, and you have chills or a fever. MAKE SURE YOU:   Understand these instructions.  Will watch your condition.  Will get help right away if you are not doing well or get worse. Document Released: 05/06/2000 Document Revised: 09/23/2013 Document Reviewed: 12/21/2011 [facility] Patient Information  2015 [place], [place]. This information is not intended to replace advice given to you by your health care provider. Make sure you discuss any questions you have with your health care provider.

## 2014-04-23 NOTE — ED Provider Notes (Signed)
CSN: [id number]     Arrival date & time 04/23/14  1615 History   First MD Initiated Contact with Patient 04/23/14 1634   This chart was scribed for non-physician practitioner, Domenic Moras, PA-C working with Tanna Furry, MD, by Chester Holstein, ED Scribe. This patient was seen in room TR11C/TR11C and the patient's care was started at 4:50 PM.     Chief Complaint  Patient presents with  . Foot Pain    Patient is a 48 y.o. male presenting with lower extremity pain. The history is provided by the patient. No language interpreter was used.  Foot Pain    HPI Comments: [name] is a 49 y.o. male with a PMHx of diabetes who presents to the Emergency Department complaining of 10/10 throbbing right ankle and foot pain with gradual onset 2 days ago. Pt states the pain is worsened with ambulation and notes he has to lift his leg with his arms to move the foot while sitting. Pt states this is a new sensation for him. Pt has elevated the foot for relief. Pt notes he was seen in the ED 4 days ago for a cough and was given a prescription for Lasix. Pt notes the coughing is resolved.  Pt denies any injury, fever, chills, numbness, pain in right knee or hip, or history of gout.     Past Medical History  Diagnosis Date  . Diabetes mellitus   . Hypertension   . Unspecified vitamin D deficiency   . Hyperlipidemia   . H/O cardiovascular stress test     a. Myoview 2011: walked for 32min, images with EF 39%, prominent inf scar, mild reversibility. b. Cardiac CT 06/2010: no CAD, only mild plaque in prox RCA. c. EF normal by echo 2014.  [name] hematuria   . TRANSIENT ISCHEMIC ATTACK, HX OF     a. In 2004.  [name] NEPHROLITHIASIS, HX OF   . PAF (paroxysmal atrial fibrillation)     a. H/o such, with recurrence of coarse afib/flutter during ER consult 03/2012.  . Ascending aortic dissection     a. 2000 - with aortic valve involvement. S/p repair of aortic dissection with placement of mechanical AVR.  [name] Obesity    . H/O mechanical aortic valve replacement   . Cholelithiasis 08/22/2013  . CAD (coronary artery disease)    Past Surgical History  Procedure Laterality Date  . Appendectomy  1998  . Coronary artery bypass graft  2000  . Aortic valve replacement  2000  . Tee without cardioversion N/A 07/22/2013    Procedure: TRANSESOPHAGEAL ECHOCARDIOGRAM (TEE);  Surgeon: [name], MD;  Location: [facility] ENDOSCOPY;  Service: Cardiovascular;  Laterality: N/A;   Family History  Problem Relation Age of Onset  . Coronary artery disease Mother   . Hypertension Mother   . Diabetes Brother   . Coronary artery disease Other 92    male 1st degree relative, CABG in his 28's (father)   History  Substance Use Topics  . Smoking status: Never Smoker   . Smokeless tobacco: Not on file  . Alcohol Use: No    Review of Systems  Constitutional: Negative for fever and chills.  Musculoskeletal: Positive for myalgias. Negative for arthralgias.  Neurological: Negative for numbness.      Allergies  Insulin glargine  Home Medications   Prior to Admission medications   Medication Sig Start Date End Date Taking? Authorizing Provider  azithromycin (ZITHROMAX Z-PAK) 250 MG tablet 2 po day one, then 1 daily x  4 days 04/18/14   Malvin Johns, MD  benzonatate (TESSALON) 100 MG capsule Take 1 capsule (100 mg total) by mouth every 8 (eight) hours. 04/18/14   Malvin Johns, MD  cholecalciferol (VITAMIN D) 1000 UNITS tablet Take 1,000 Units by mouth daily.    Historical Provider, MD  cloNIDine (CATAPRES) 0.1 MG tablet Take 1 tablet (0.1 mg total) by mouth 2 (two) times daily.    Lelon Perla, MD  clopidogrel (PLAVIX) 75 MG tablet Take 1 tablet (75 mg total) by mouth daily with breakfast. Patient not taking: Reported on 04/18/2014 07/25/13   Erlene Quan, PA-C  furosemide (LASIX) 40 MG tablet Take 1 tablet (40 mg total) by mouth 2 (two) times daily. 07/25/13   Erlene Quan, PA-C  glimepiride (AMARYL) 1 MG tablet take 1  tablet by mouth once daily BEFORE BREAKFAST Patient not taking: Reported on 04/18/2014 07/25/13   Biagio Borg, MD  glimepiride (AMARYL) 1 MG tablet Take 1 mg by mouth daily before breakfast.    Historical Provider, MD  glucose blood test strip 1 per day - Use as instructed Patient taking differently: 1 each by Other route daily.  08/30/13   Biagio Borg, MD  Lancets MISC Use 1 per day as directed 08/22/13   Biagio Borg, MD  levothyroxine (SYNTHROID, LEVOTHROID) 50 MCG tablet Take 1 tablet (50 mcg total) by mouth daily. 08/22/13   Biagio Borg, MD  losartan (COZAAR) 100 MG tablet Take 100 mg by mouth daily.    Historical Provider, MD  losartan (COZAAR) 100 MG tablet take 1 tablet by mouth once daily Patient not taking: Reported on 04/18/2014    Biagio Borg, MD  metFORMIN (GLUCOPHAGE) 1000 MG tablet Take 1,000 mg by mouth 2 (two) times daily with a meal.    Historical Provider, MD  metFORMIN (GLUCOPHAGE) 1000 MG tablet take 1 tablet by mouth twice a day with food Patient not taking: Reported on 04/18/2014 02/10/14   Biagio Borg, MD  metoprolol (TOPROL-XL) 200 MG 24 hr tablet Take 1 tablet (200 mg total) by mouth daily. 07/31/13   Burtis Junes, NP  nitroGLYCERIN (NITROSTAT) 0.4 MG SL tablet Place 1 tablet (0.4 mg total) under the tongue every 5 (five) minutes x 3 doses as needed for chest pain. 07/25/13   Erlene Quan, PA-C  potassium chloride SA (K-DUR,KLOR-CON) 20 MEQ tablet Take 20 mEq by mouth 2 (two) times daily.    Historical Provider, MD  potassium chloride SA (K-DUR,KLOR-CON) 20 MEQ tablet take 2 tablets by mouth once daily Patient not taking: Reported on 04/18/2014 02/10/14   Biagio Borg, MD  pravastatin (PRAVACHOL) 40 MG tablet Take 40 mg by mouth every morning.     Historical Provider, MD  pravastatin (PRAVACHOL) 40 MG tablet take 1 tablet by mouth once daily Patient not taking: Reported on 04/18/2014 02/10/14   Biagio Borg, MD  warfarin (COUMADIN) 5 MG tablet Take as directed by  Anticoagulation clinic Patient taking differently: Take 5-15 mg by mouth See admin instructions. 15mg  on Wednesdays and Fridays. 5mg  all other days. 12/20/13   Deboraha Sprang, MD   BP 152/82 mmHg  Pulse 63  Temp(Src) 98 F (36.7 C)  Resp 18  SpO2 95% Physical Exam  Constitutional: He is oriented to person, place, and time. He appears well-developed and well-nourished.  HENT:  Head: Normocephalic.  Eyes: Conjunctivae are normal.  Neck: Normal range of motion. Neck supple.  Cardiovascular:  R  foot Pedal pulse is palpable with Brisk cap refill  Pulmonary/Chest: Effort normal.  Musculoskeletal: Normal range of motion.  Right foot tenderness along dorsal most signigicant to right great toe.  Right ankle with normal dorsi and plantar flexion,  normal inversion  Neurological: He is alert and oriented to person, place, and time.  Sensations intact to distal feet  Skin: Skin is warm and dry. No erythema.  Mild warmth to affected area (dorsum of R foot and R great toe) no surrounding erythema  Psychiatric: He has a normal mood and affect. His behavior is normal.  Nursing note and vitals reviewed.   ED Course  Procedures (including critical care time) DIAGNOSTIC STUDIES: Oxygen Saturation is 95% on room air, normal by my interpretation.    COORDINATION OF CARE: 4:57 PM patient here with right foot pain. Initially mention right ankle pain over is full range of motion to his right ankle. Pain is to the dorsum of right foot and along the right great toe even with mild palpation to suggest a gouty flair. I suspect this is related to recent initiation of diuretic pill. He does not appears to have a septic joint and no trauma to suggest any broken bone. Pleasant throughout treatment.  Suggested follow-up with PCP for tests. the patient agrees with the plan and has no further questions at this time.   Labs Review Labs Reviewed - No data to display  Imaging Review No results found.   EKG  Interpretation None      MDM   Final diagnoses:  Right foot pain   BP 152/82 mmHg  Pulse 63  Temp(Src) 98 F (36.7 C)  Resp 18  SpO2 95%   I personally performed the services described in this documentation, which was scribed in my presence. The recorded information has been reviewed and is accurate.      Domenic Moras, PA-C 04/23/14 [place], MD 04/28/14 [id number]

## 2014-04-30 DIAGNOSIS — M25571 Pain in right ankle and joints of right foot: Secondary | ICD-10-CM | POA: Diagnosis not present

## 2014-05-01 ENCOUNTER — Encounter (HOSPITAL_COMMUNITY): Payer: Self-pay | Admitting: Interventional Cardiology

## 2014-05-01 ENCOUNTER — Other Ambulatory Visit: Payer: Self-pay | Admitting: Cardiology

## 2014-05-01 MED ORDER — WARFARIN SODIUM 5 MG PO TABS: ORAL_TABLET | ORAL | Status: DC

## 2014-05-07 ENCOUNTER — Ambulatory Visit (INDEPENDENT_AMBULATORY_CARE_PROVIDER_SITE_OTHER): Payer: Medicare Other | Admitting: *Deleted

## 2014-05-07 DIAGNOSIS — I480 Paroxysmal atrial fibrillation: Secondary | ICD-10-CM | POA: Diagnosis not present

## 2014-05-07 DIAGNOSIS — Z5181 Encounter for therapeutic drug level monitoring: Secondary | ICD-10-CM | POA: Diagnosis not present

## 2014-05-07 LAB — POCT INR: INR: 2.9

## 2014-06-05 ENCOUNTER — Encounter (HOSPITAL_COMMUNITY): Payer: Self-pay | Admitting: *Deleted

## 2014-06-05 ENCOUNTER — Emergency Department (HOSPITAL_COMMUNITY): Payer: Medicare Other

## 2014-06-05 ENCOUNTER — Emergency Department (HOSPITAL_COMMUNITY)
Admission: EM | Admit: 2014-06-05 | Discharge: 2014-06-05 | Disposition: A | Discharge status: Home / Self Care | Payer: Medicare Other | Attending: Emergency Medicine | Admitting: Emergency Medicine

## 2014-06-05 DIAGNOSIS — R51 Headache: Secondary | ICD-10-CM | POA: Diagnosis not present

## 2014-06-05 DIAGNOSIS — Z954 Presence of other heart-valve replacement: Secondary | ICD-10-CM | POA: Insufficient documentation

## 2014-06-05 DIAGNOSIS — E669 Obesity, unspecified: Secondary | ICD-10-CM | POA: Insufficient documentation

## 2014-06-05 DIAGNOSIS — R519 Headache, unspecified: Secondary | ICD-10-CM

## 2014-06-05 DIAGNOSIS — E785 Hyperlipidemia, unspecified: Secondary | ICD-10-CM | POA: Insufficient documentation

## 2014-06-05 DIAGNOSIS — E119 Type 2 diabetes mellitus without complications: Secondary | ICD-10-CM | POA: Insufficient documentation

## 2014-06-05 DIAGNOSIS — R42 Dizziness and giddiness: Secondary | ICD-10-CM | POA: Diagnosis not present

## 2014-06-05 DIAGNOSIS — Z87442 Personal history of urinary calculi: Secondary | ICD-10-CM | POA: Diagnosis not present

## 2014-06-05 DIAGNOSIS — Z7902 Long term (current) use of antithrombotics/antiplatelets: Secondary | ICD-10-CM | POA: Diagnosis not present

## 2014-06-05 DIAGNOSIS — Z8673 Personal history of transient ischemic attack (TIA), and cerebral infarction without residual deficits: Secondary | ICD-10-CM | POA: Insufficient documentation

## 2014-06-05 DIAGNOSIS — Z8719 Personal history of other diseases of the digestive system: Secondary | ICD-10-CM | POA: Insufficient documentation

## 2014-06-05 DIAGNOSIS — E559 Vitamin D deficiency, unspecified: Secondary | ICD-10-CM | POA: Diagnosis not present

## 2014-06-05 DIAGNOSIS — Z79899 Other long term (current) drug therapy: Secondary | ICD-10-CM | POA: Insufficient documentation

## 2014-06-05 DIAGNOSIS — I480 Paroxysmal atrial fibrillation: Secondary | ICD-10-CM | POA: Diagnosis not present

## 2014-06-05 DIAGNOSIS — Z951 Presence of aortocoronary bypass graft: Secondary | ICD-10-CM | POA: Insufficient documentation

## 2014-06-05 DIAGNOSIS — I10 Essential (primary) hypertension: Secondary | ICD-10-CM | POA: Insufficient documentation

## 2014-06-05 DIAGNOSIS — I951 Orthostatic hypotension: Secondary | ICD-10-CM

## 2014-06-05 DIAGNOSIS — I2510 Atherosclerotic heart disease of native coronary artery without angina pectoris: Secondary | ICD-10-CM | POA: Diagnosis not present

## 2014-06-05 DIAGNOSIS — Z9889 Other specified postprocedural states: Secondary | ICD-10-CM | POA: Diagnosis not present

## 2014-06-05 DIAGNOSIS — Z7901 Long term (current) use of anticoagulants: Secondary | ICD-10-CM | POA: Insufficient documentation

## 2014-06-05 DIAGNOSIS — H538 Other visual disturbances: Secondary | ICD-10-CM | POA: Diagnosis not present

## 2014-06-05 DIAGNOSIS — I672 Cerebral atherosclerosis: Secondary | ICD-10-CM | POA: Diagnosis not present

## 2014-06-05 LAB — COMPREHENSIVE METABOLIC PANEL
ALT: 14 U/L (ref 0–53)
ANION GAP: 4 — AB (ref 5–15)
AST: 22 U/L (ref 0–37)
Albumin: 3.7 g/dL (ref 3.5–5.2)
Alkaline Phosphatase: 57 U/L (ref 39–117)
BUN: 9 mg/dL (ref 6–23)
CO2: 35 mmol/L — ABNORMAL HIGH (ref 19–32)
CREATININE: 1.2 mg/dL (ref 0.50–1.35)
Calcium: 9 mg/dL (ref 8.4–10.5)
Chloride: 100 mEq/L (ref 96–112)
GFR calc non Af Amer: 69 mL/min — ABNORMAL LOW (ref >90)
GFR, EST AFRICAN AMERICAN: 80 mL/min — AB (ref >90)
Glucose, Bld: 231 mg/dL — ABNORMAL HIGH (ref 70–99)
Potassium: 3.6 mmol/L (ref 3.5–5.1)
Sodium: 139 mmol/L (ref 135–145)
TOTAL PROTEIN: 7.4 g/dL (ref 6.0–8.3)
Total Bilirubin: 1.1 mg/dL (ref 0.3–1.2)

## 2014-06-05 LAB — I-STAT VENOUS BLOOD GAS, ED
Acid-Base Excess: 3 mmol/L — ABNORMAL HIGH (ref 0.0–2.0)
BICARBONATE: 29.8 meq/L — AB (ref 20.0–24.0)
O2 Saturation: 60 %
PCO2 VEN: 52.2 mmHg — AB (ref 45.0–50.0)
TCO2: 31 mmol/L (ref 0–100)
pH, Ven: 7.364 — ABNORMAL HIGH (ref 7.250–7.300)
pO2, Ven: 33 mmHg (ref 30.0–45.0)

## 2014-06-05 LAB — I-STAT TROPONIN, ED: TROPONIN I, POC: 0 ng/mL (ref 0.00–0.08)

## 2014-06-05 LAB — CBC
HCT: 37.7 % — ABNORMAL LOW (ref 39.0–52.0)
Hemoglobin: 13.6 g/dL (ref 13.0–17.0)
MCH: 28.8 pg (ref 26.0–34.0)
MCHC: 36.1 g/dL — ABNORMAL HIGH (ref 30.0–36.0)
MCV: 79.7 fL (ref 78.0–100.0)
PLATELETS: 153 10*3/uL (ref 150–400)
RBC: 4.73 MIL/uL (ref 4.22–5.81)
RDW: 15.1 % (ref 11.5–15.5)
WBC: 6.9 10*3/uL (ref 4.0–10.5)

## 2014-06-05 LAB — CBG MONITORING, ED
GLUCOSE-CAPILLARY: 219 mg/dL — AB (ref 70–99)
Glucose-Capillary: 194 mg/dL — ABNORMAL HIGH (ref 70–99)

## 2014-06-05 LAB — PROTIME-INR
INR: 1.62 — AB (ref 0.00–1.49)
Prothrombin Time: 19.4 seconds — ABNORMAL HIGH (ref 11.6–15.2)

## 2014-06-05 IMAGING — CT CT HEAD W/O CM
2 series · 21 of 30 positions shown, 25 images · non-contrast
Comparison: None.

CLINICAL DATA: Dizziness.

EXAM:
CT HEAD WITHOUT CONTRAST
TECHNIQUE: Contiguous axial images were obtained from the base of the skull
through the vertex without intravenous contrast.

[Series 201: head w/o, idose (1) · axial · non-contrast · 0.53mm/px · z∈[+140,+268]mm · 13 of 33 slices shown, 17 images]
[im 3/33  brain]
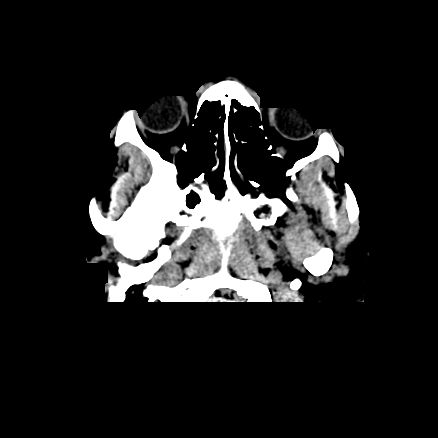
[im 3/33  bone]
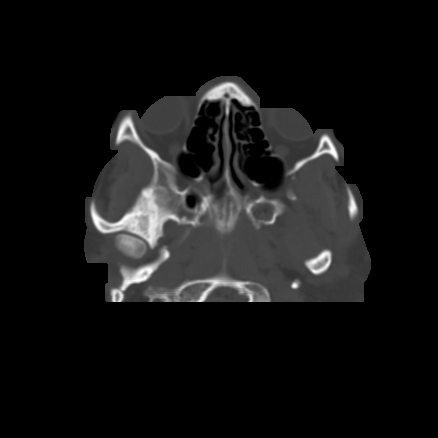
[im 5/33  brain]
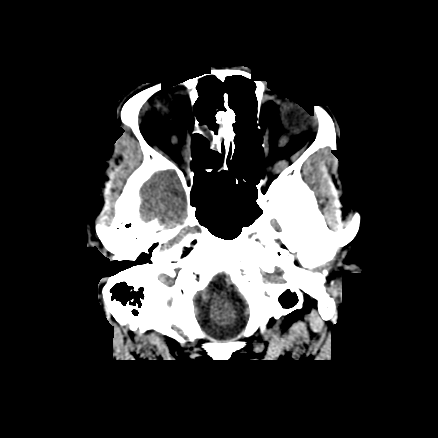
[im 7/33  brain]
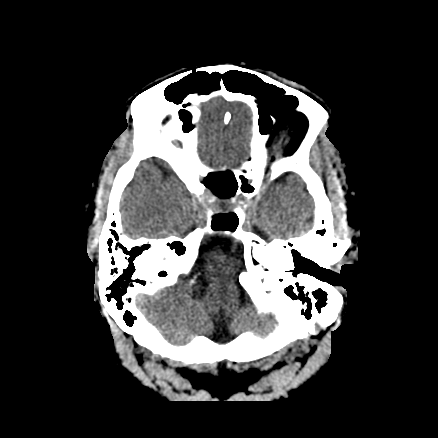
[im 10/33  brain]
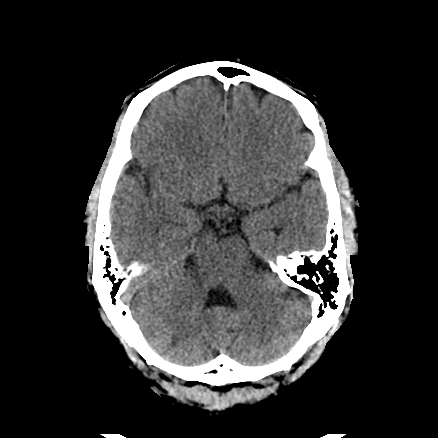
[im 12/33  brain]
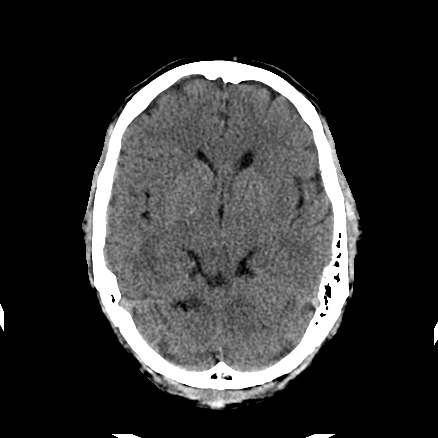
[im 12/33  bone]
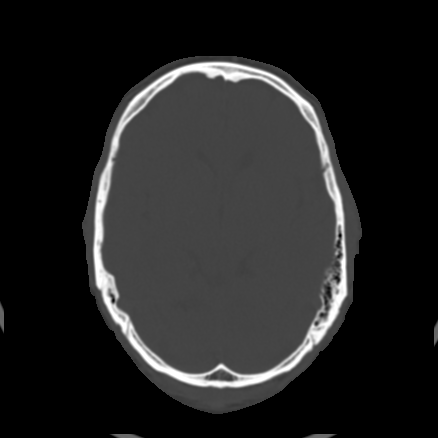
[im 14/33  brain]
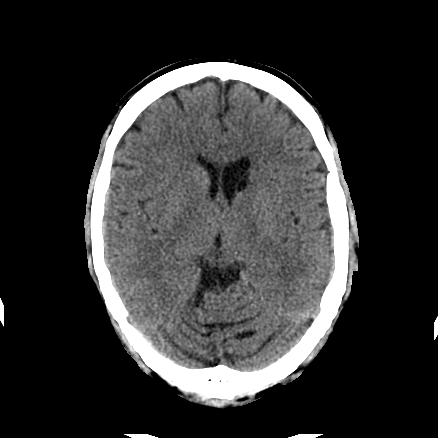
[im 17/33  brain]
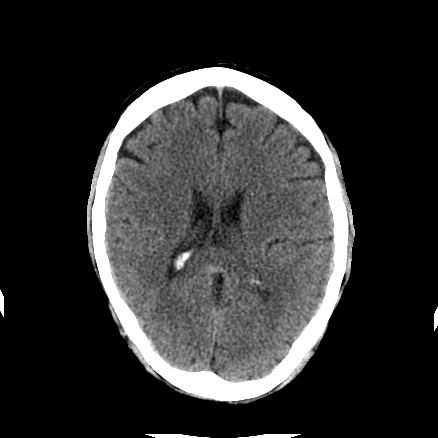
[im 19/33  brain]
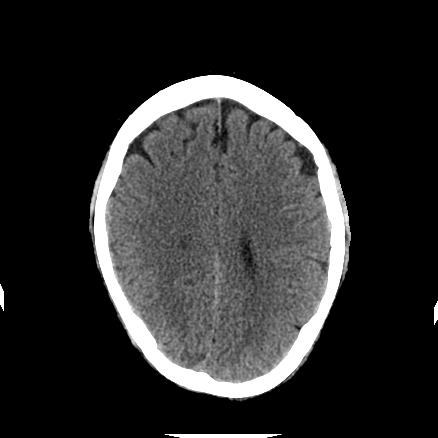
[im 21/33  brain]
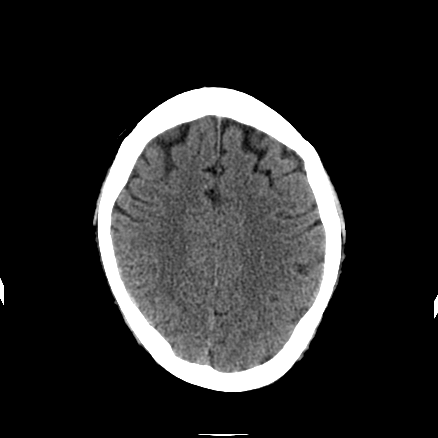
[im 21/33  bone]
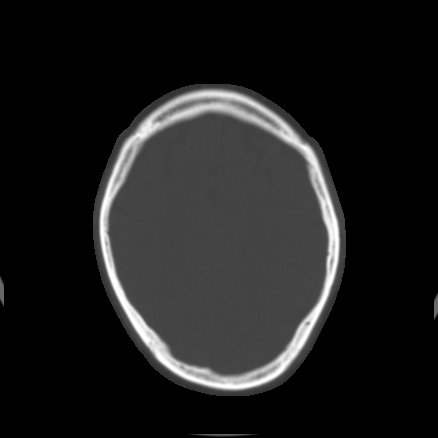
[im 23/33  brain]
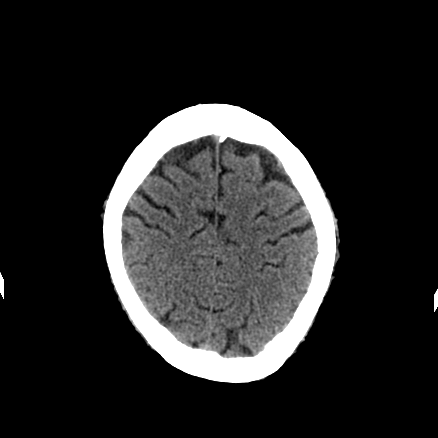
[im 26/33  brain]
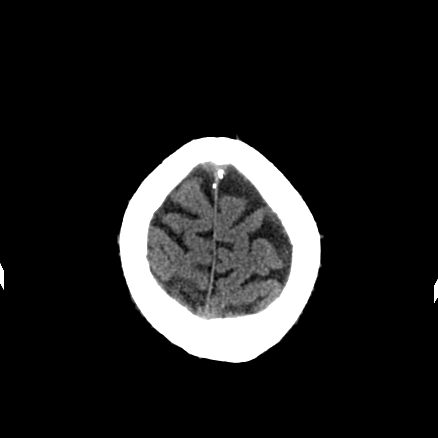
[im 28/33  brain]
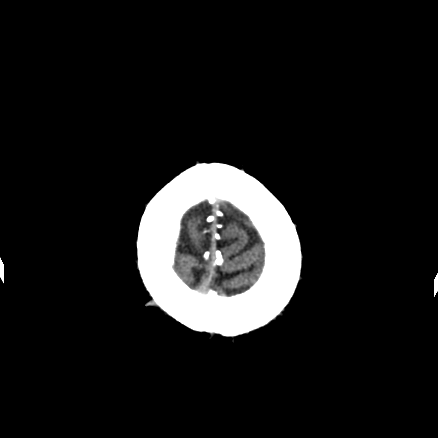
[im 30/33  brain]
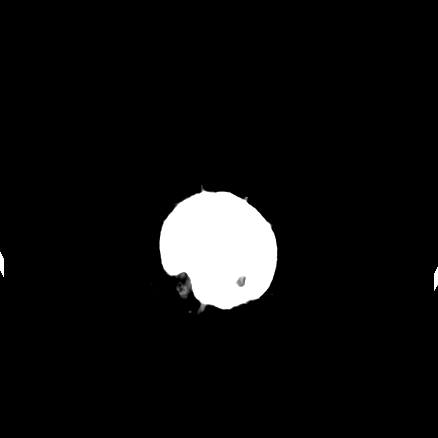
[im 30/33  bone]
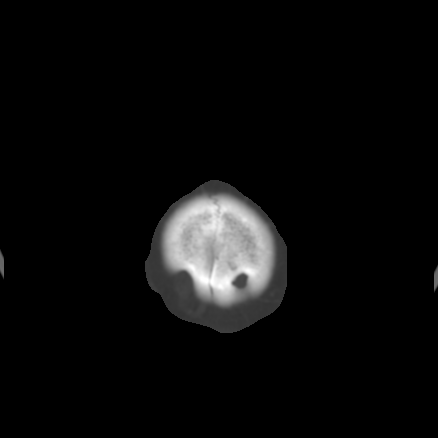

[Series 202: head w/o bone, idose (1) · axial · non-contrast · 0.53mm/px · z∈[+140,+268]mm · 8 of 33 slices shown]
[im 3/33  bone]
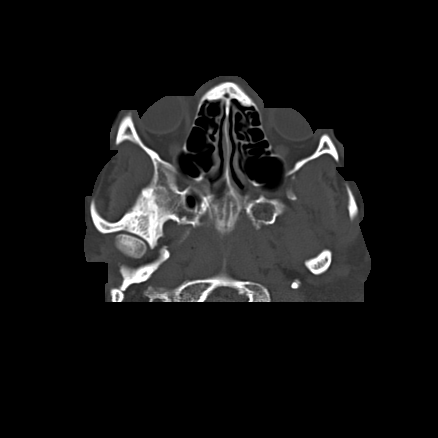
[im 7/33  bone]
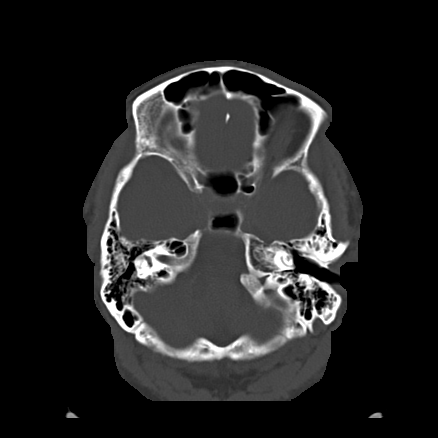
[im 12/33  bone]
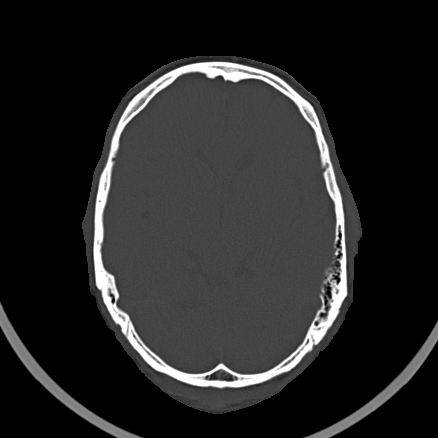
[im 14/33  bone]
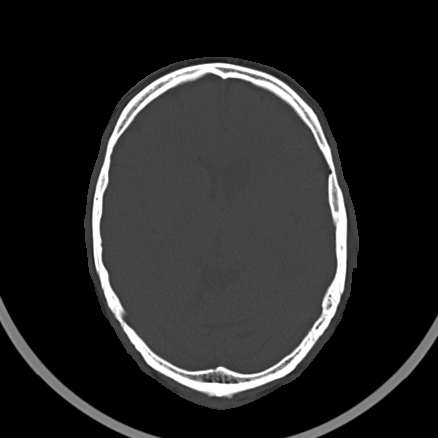
[im 19/33  bone]
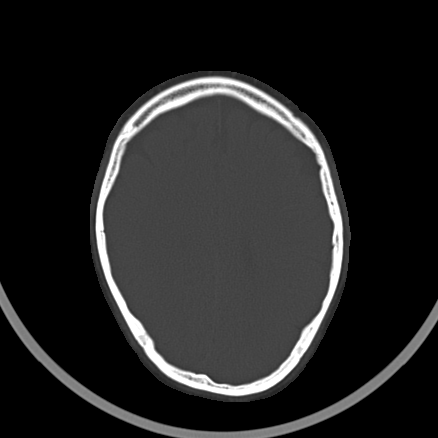
[im 21/33  bone]
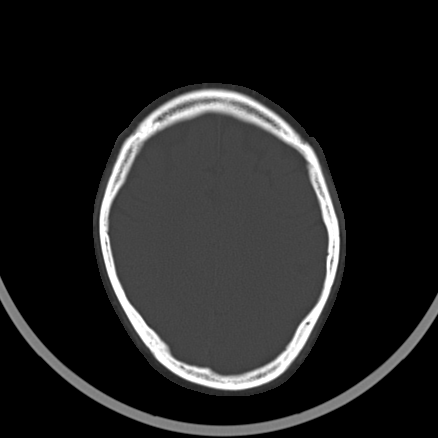
[im 26/33  bone]
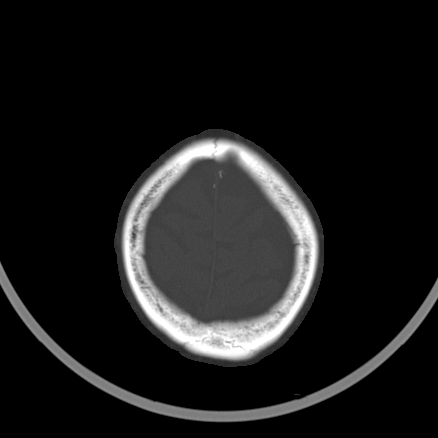
[im 30/33  bone]
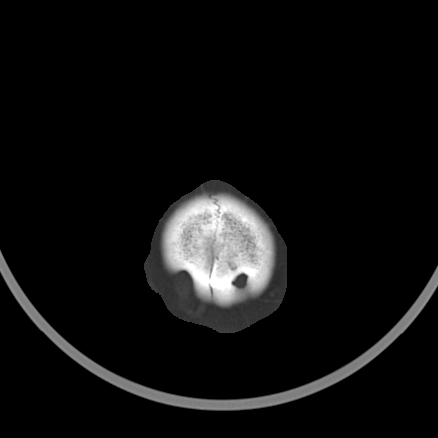

[21 of 30 positions shown; findings below may reference images not displayed]

FINDINGS: There is a relatively well-defined focus of hypoattenuation
measuring 1.4 cm in the posterior right cerebellar hemisphere (image
11) consistent with a small infarct, likely chronic. There is also a
CSF density focus more superiorly in the right cerebellum just
underneath the tentorium measuring 1.8 cm which may reflect a
chronic infarct or less likely small arachnoid cyst. There may also
be a tiny, chronic infarct in the superior left cerebellum. Chronic
lacunar infarct is present in the head of the left caudate with mild
ex vacuo dilatation of the frontal horn of left lateral ventricle.
There is no definite evidence of acute large territory infarct,
intracranial hemorrhage,, midline shift, or extra-axial fluid
collection. There is no hydrocephalus.

Orbits are unremarkable. Mastoid air cells are clear. Focal right
ethmoid air cell opacification is noted. Incidental note is made of
persistent parietal foramina bilaterally.
IMPRESSION: 1. No acute intracranial hemorrhage or definite acute infarct
identified.
2. Chronic infarcts in the cerebellum and left basal ganglia.

## 2014-06-05 IMAGING — CT CT ANGIO HEAD
1 of 11 series · 4 of 33 positions shown · IV contrast (Iohexol (Omnipaque 350))
Comparison: Prior CT from earlier the same day.

CLINICAL DATA: Initial evaluation for 3 day history of posterior
headache radiating around right-sided head, dizziness.

EXAM:
CT ANGIOGRAPHY HEAD
TECHNIQUE: Multidetector CT imaging of the head was performed using the
standard protocol during bolus administration of intravenous
contrast. Multiplanar CT image reconstructions and MIPs were
obtained to evaluate the vascular anatomy.
CONTRAST:  50mL OMNIPAQUE IOHEXOL 350 MG/ML SOLN

[Series 503: axial 1 x 1 · axial · 0.46mm/px · z∈[+300,+395]mm · 4 of 159 slices shown]
[im 32/159  soft-tissue]
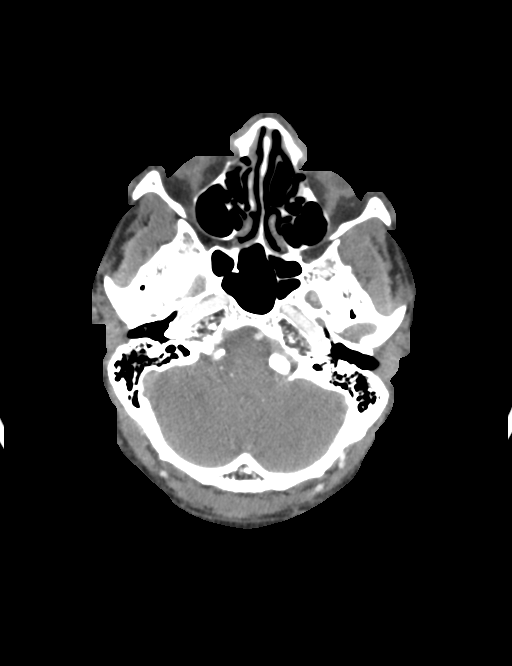
[im 64/159  bone]
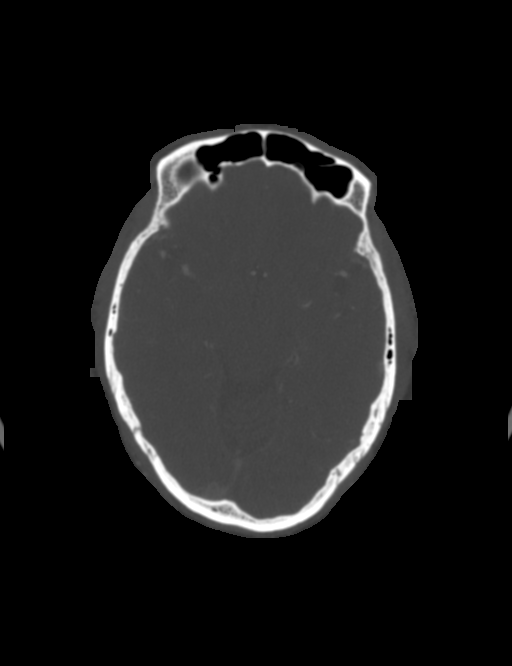
[im 95/159  soft-tissue]
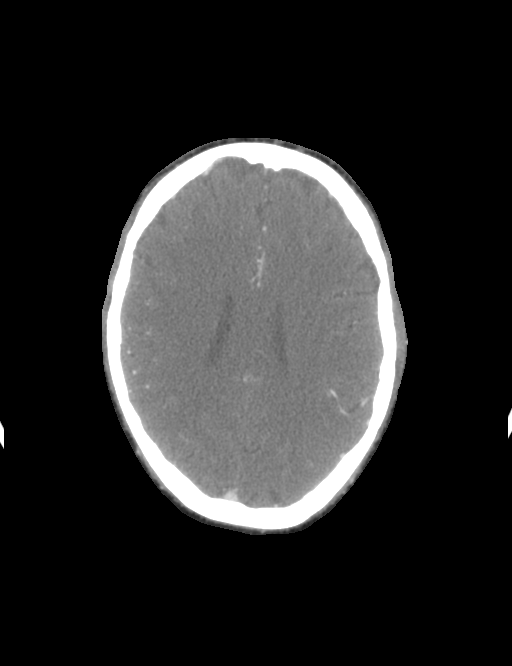
[im 127/159  bone]
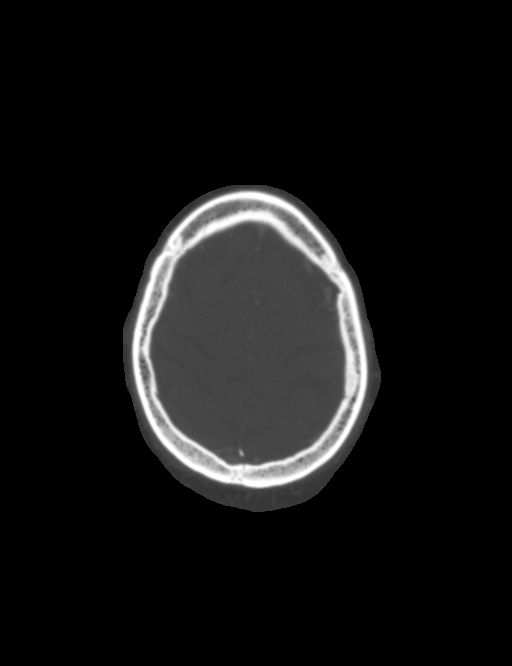

[4 of 33 positions shown; findings below may reference images not displayed]

FINDINGS: CTA HEAD

Anterior circulation: The visualized portions of the distal internal
carotid arteries are widely patent. The petrous, cavernous, and
supra clinoid segments are well opacified without hemodynamically
significant stenosis. A1 segments, anterior communicating artery,
and anterior cerebral arteries are well opacified bilaterally.

M1 segments well opacified without proximal branch occlusion or
significant stenosis. Distal MCA branches within normal limits.

Posterior circulation: The right vertebral artery is dominant.
Moderate multi focal irregularity present within the diminutive left
V4 segment with associated moderate multi focal stenoses. The
posterior communicating arteries are grossly patent bilaterally,
although not well evaluated. Vertebrobasilar junction and basilar
artery are widely patent. Superior cerebellar arteries are patent
proximally. Mild multi focal irregularity present within the
posterior cerebral arteries bilaterally without significant stenosis
or proximal branch occlusion.

Venous sinuses: No gross abnormality within the venous sinuses.

Anatomic variants: No aneurysm or vascular malformation identified.

Delayed phase:No abnormal enhancement on delayed sequence. Chronic
infarcts involving the bilateral cerebellar hemispheres and left
basal ganglia again noted.
IMPRESSION: 1. No proximal branch occlusion or hemodynamically significant
stenosis identified within the intracranial circulation.
2. Multi focal atherosclerotic irregularity within the diminutive
left vertebral artery with associated mild to moderate multi focal
atherosclerotic stenoses. The dominant right vertebral artery is
widely patent as is the basilar artery.
3. Mild multi focal atherosclerotic irregularity within the
posterior cerebral arteries bilaterally.
4. No intracranial aneurysm or vascular vascular malformation.

## 2014-06-05 MED ORDER — FENTANYL CITRATE 0.05 MG/ML IJ SOLN
50.0000 ug | Freq: Once | INTRAMUSCULAR | Status: AC
  Administered 2014-06-05: 50 ug via INTRAVENOUS
  Filled 2014-06-05: qty 2

## 2014-06-05 MED ORDER — LACTATED RINGERS IV BOLUS (SEPSIS)
1000.0000 mL | Freq: Once | INTRAVENOUS | Status: AC
Start: 2014-06-05 — End: 2014-06-05
  Administered 2014-06-05: 1000 mL via INTRAVENOUS

## 2014-06-05 MED ORDER — IOHEXOL 350 MG/ML SOLN
50.0000 mL | Freq: Once | INTRAVENOUS | Status: AC | PRN
  Administered 2014-06-05: 50 mL via INTRAVENOUS

## 2014-06-05 MED ORDER — SODIUM CHLORIDE 0.9 % IV BOLUS (SEPSIS)
1000.0000 mL | Freq: Once | INTRAVENOUS | Status: AC
  Administered 2014-06-05: 1000 mL via INTRAVENOUS

## 2014-06-05 MED ORDER — MORPHINE SULFATE 4 MG/ML IJ SOLN
4.0000 mg | Freq: Once | INTRAMUSCULAR | Status: AC
  Administered 2014-06-05: 4 mg via INTRAVENOUS
  Filled 2014-06-05: qty 1

## 2014-06-05 MED ORDER — METOCLOPRAMIDE HCL 5 MG/ML IJ SOLN
10.0000 mg | Freq: Once | INTRAMUSCULAR | Status: AC
  Administered 2014-06-05: 10 mg via INTRAVENOUS
  Filled 2014-06-05: qty 2

## 2014-06-05 NOTE — ED Notes (Signed)
Dr. Roxanne Mins consulting with cardiology, for information pertaining to the safety of MRI with [facility] [employer] mechanical heart valve replacement present.

## 2014-06-05 NOTE — Discharge Instructions (Signed)

## 2014-06-05 NOTE — ED Notes (Addendum)
Patient states he has had a posterior headache for 3 days.  It also radiates to frontal.  Patient states he has also had some blurred vision.  Patient denies any numbness/weakness.  Patient denies any trauma.  Patient has tried asprin w/o relief.  Patient denies hx of migraines.  He has hx of htn, diabetes, cad. Patient then advised that he was at gym and became dizzy during exercise.

## 2014-06-05 NOTE — ED Notes (Signed)
Pt transported to MRI 

## 2014-06-05 NOTE — ED Provider Notes (Addendum)
CSN: [id number]     Arrival date & time 06/05/14  1010 History   First MD Initiated Contact with Patient 06/05/14 1107     Chief Complaint  Patient presents with  . Headache  . Dizziness     (Consider location/radiation/quality/duration/timing/severity/associated sxs/prior Treatment) HPI Comments: [name] is a pleasant 49yo M with Hx of Aortic valve replacement (2000), TIA (2003), CAD s/p stent in 2015, HTN, DM, who presents to the ED with 3 day history of worsening HA and 1 day history of dizziness.  The headache started 3 days ago and has been worsening gradually. Currently complains of 8/10 throbbing HA, waxing and waning in intensity but present constantly. Pointing at frontal area moving posteriorly to the back of the head. Dizziness started after 40 minutes on the elliptical. Pt normally works out every day w/o dizziness. He felt lightheaded (no vertigo) after stepping off the treadmill and felt as if he might fall or pass out. He did not pass out or hit his head. After sitting down and resting, he became light-headed again after attempting to stand, and he was driven to the ED because of the new dizziness by his sister. Endorses diaphoresis. HA is somewhat better positionally if he tilts his head back while sitting and worsened if he wears his eyeglasses. Endorses some blurred vision yesterday, but feels his vision is normal for him without his glasses at this time.   He does not tend to get headaches but had a TIA with associated R sided weakness, numbness, and slurred speech in 2003. This episode does not feel like his previous TIA.   Pt denies neck pain/stiffness, weakness, numbness, tingling, slurred speech, CP, cough, n/v/d/c. Denies any recent falls/trauma. He takes a fluid pill but states also drinks plenty of water so does not think he could have been dehydrated. He takes coumadin for mechanical aortic valve.  Patient is a 49 y.o. male presenting with headaches and dizziness.  The history is provided by the patient.  Headache Associated symptoms: dizziness   Associated symptoms: no cough, no fever, no neck pain and no numbness   Dizziness Associated symptoms: headaches   Associated symptoms: no chest pain and no shortness of breath     Past Medical History  Diagnosis Date  . Diabetes mellitus   . Hypertension   . Unspecified vitamin D deficiency   . Hyperlipidemia   . H/O cardiovascular stress test     a. Myoview 2011: walked for 66min, images with EF 39%, prominent inf scar, mild reversibility. b. Cardiac CT 06/2010: no CAD, only mild plaque in prox RCA. c. EF normal by echo 2014.  [name] hematuria   . TRANSIENT ISCHEMIC ATTACK, HX OF     a. In 2004.  [name] NEPHROLITHIASIS, HX OF   . PAF (paroxysmal atrial fibrillation)     a. H/o such, with recurrence of coarse afib/flutter during ER consult 03/2012.  . Ascending aortic dissection     a. 2000 - with aortic valve involvement. S/p repair of aortic dissection with placement of mechanical AVR.  [name] Obesity   . H/O mechanical aortic valve replacement   . Cholelithiasis 08/22/2013  . CAD (coronary artery disease)    Past Surgical History  Procedure Laterality Date  . Appendectomy  1998  . Coronary artery bypass graft  2000  . Aortic valve replacement  2000  . Tee without cardioversion N/A 07/22/2013    Procedure: TRANSESOPHAGEAL ECHOCARDIOGRAM (TEE);  Surgeon: [name], MD;  Location: [facility]  ENDOSCOPY;  Service: Cardiovascular;  Laterality: N/A;  . Left and right heart catheterization with coronary angiogram N/A 07/19/2013    Procedure: LEFT AND RIGHT HEART CATHETERIZATION WITH CORONARY ANGIOGRAM;  Surgeon: [name], MD;  Location: [facility] CATH LAB;  Service: Cardiovascular;  Laterality: N/A;   Family History  Problem Relation Age of Onset  . Coronary artery disease Mother   . Hypertension Mother   . Diabetes Brother   . Coronary artery disease Other 14    male 1st degree relative, CABG in his 59's  (father)   History  Substance Use Topics  . Smoking status: Never Smoker   . Smokeless tobacco: Not on file  . Alcohol Use: No    Review of Systems  Constitutional: Negative for fever, chills and activity change.  Eyes: Positive for visual disturbance.  Respiratory: Negative for cough, chest tightness and shortness of breath.   Cardiovascular: Negative for chest pain.  Gastrointestinal: Negative for abdominal distention.  Genitourinary: Negative for dysuria, enuresis and difficulty urinating.  Musculoskeletal: Negative for arthralgias and neck pain.  Neurological: Positive for dizziness and headaches. Negative for speech difficulty, weakness, light-headedness and numbness.  Psychiatric/Behavioral: Negative for confusion.  All other systems reviewed and are negative.     Allergies  Insulin glargine  Home Medications   Prior to Admission medications   Medication Sig Start Date End Date Taking? Authorizing Provider  cholecalciferol (VITAMIN D) 1000 UNITS tablet Take 1,000 Units by mouth daily.   Yes Historical Provider, MD  cloNIDine (CATAPRES) 0.1 MG tablet take 1 tablet by mouth twice a day 05/01/14  Yes Lelon Perla, MD  clopidogrel (PLAVIX) 75 MG tablet Take 1 tablet (75 mg total) by mouth daily with breakfast. 07/25/13  Yes Erlene Quan, PA-C  furosemide (LASIX) 40 MG tablet Take 1 tablet (40 mg total) by mouth 2 (two) times daily. 07/25/13  Yes Erlene Quan, PA-C  glimepiride (AMARYL) 1 MG tablet take 1 tablet by mouth once daily BEFORE BREAKFAST 07/25/13  Yes Biagio Borg, MD  glucose blood test strip 1 per day - Use as instructed Patient taking differently: 1 each by Other route daily.  08/30/13  Yes Biagio Borg, MD  Lancets MISC Use 1 per day as directed 08/22/13  Yes Biagio Borg, MD  levothyroxine (SYNTHROID, LEVOTHROID) 50 MCG tablet Take 1 tablet (50 mcg total) by mouth daily. 08/22/13  Yes Biagio Borg, MD  losartan (COZAAR) 100 MG tablet take 1 tablet by mouth once  daily   Yes Biagio Borg, MD  metFORMIN (GLUCOPHAGE) 1000 MG tablet take 1 tablet by mouth twice a day with food 02/10/14  Yes Biagio Borg, MD  metoprolol (TOPROL-XL) 200 MG 24 hr tablet Take 1 tablet (200 mg total) by mouth daily. 07/31/13  Yes Burtis Junes, NP  potassium chloride SA (K-DUR,KLOR-CON) 20 MEQ tablet Take 20 mEq by mouth 2 (two) times daily.   Yes Historical Provider, MD  pravastatin (PRAVACHOL) 40 MG tablet take 1 tablet by mouth once daily 02/10/14  Yes Biagio Borg, MD  warfarin (COUMADIN) 5 MG tablet Take as directed by Anticoagulation clinic Patient taking differently: Take 5-7.5 mg by mouth daily at 6 PM. Take 5 mg daily except on 7.5 mg on Monday & Friday 05/01/14  Yes Deboraha Sprang, MD  azithromycin (ZITHROMAX Z-PAK) 250 MG tablet 2 po day one, then 1 daily x 4 days Patient not taking: Reported on 06/05/2014 04/18/14   Malvin Johns,  MD  benzonatate (TESSALON) 100 MG capsule Take 1 capsule (100 mg total) by mouth every 8 (eight) hours. Patient not taking: Reported on 06/05/2014 04/18/14   Malvin Johns, MD  colchicine 0.6 MG tablet Take 1 tablet (0.6 mg total) by mouth daily. Patient not taking: Reported on 06/05/2014 04/23/14   Domenic Moras, PA-C  nitroGLYCERIN (NITROSTAT) 0.4 MG SL tablet Place 1 tablet (0.4 mg total) under the tongue every 5 (five) minutes x 3 doses as needed for chest pain. 07/25/13   Erlene Quan, PA-C  oxyCODONE-acetaminophen (PERCOCET/ROXICET) 5-325 MG per tablet Take 2 tablets by mouth every 4 (four) hours as needed for severe pain. Patient not taking: Reported on 06/05/2014 04/23/14   Domenic Moras, PA-C  potassium chloride SA (K-DUR,KLOR-CON) 20 MEQ tablet take 2 tablets by mouth once daily Patient not taking: Reported on 04/18/2014 02/10/14   Biagio Borg, MD   BP 119/76 mmHg  Pulse 56  Temp(Src) 97.7 F (36.5 C) (Oral)  Resp 16  SpO2 97% Physical Exam  Constitutional: He is oriented to person, place, and time. He appears well-developed.  HENT:   Head: Normocephalic and atraumatic.  Eyes: Conjunctivae and EOM are normal. Pupils are equal, round, and reactive to light.  Neck: Normal range of motion. Neck supple.  Cardiovascular: Normal rate and regular rhythm.   Pulmonary/Chest: Effort normal and breath sounds normal.  Abdominal: Soft. Bowel sounds are normal. He exhibits no distension. There is no tenderness. There is no rebound and no guarding.  Neurological: He is alert and oriented to person, place, and time. No cranial nerve deficit. Coordination normal.  Neg HINTs exam. Symptoms reproduced with turning of the head to the R side. No carotid bruit  Skin: Skin is warm.  Nursing note and vitals reviewed.   ED Course  Procedures (including critical care time) Labs Review Labs Reviewed  CBC - Abnormal; Notable for the following:    HCT 37.7 (*)    MCHC 36.1 (*)    All other components within normal limits  PROTIME-INR - Abnormal; Notable for the following:    Prothrombin Time 19.4 (*)    INR 1.62 (*)    All other components within normal limits  COMPREHENSIVE METABOLIC PANEL - Abnormal; Notable for the following:    CO2 35 (*)    Glucose, Bld 231 (*)    GFR calc non Af Amer 69 (*)    GFR calc Af Amer 80 (*)    Anion gap 4 (*)    All other components within normal limits  CBG MONITORING, ED - Abnormal; Notable for the following:    Glucose-Capillary 194 (*)    All other components within normal limits  I-STAT VENOUS BLOOD GAS, ED - Abnormal; Notable for the following:    pH, Ven 7.364 (*)    pCO2, Ven 52.2 (*)    Bicarbonate 29.8 (*)    Acid-Base Excess 3.0 (*)    All other components within normal limits  CBG MONITORING, ED - Abnormal; Notable for the following:    Glucose-Capillary 219 (*)    All other components within normal limits  I-STAT TROPOININ, ED    Imaging Review Ct Head Wo Contrast  06/05/2014   CLINICAL DATA:  Dizziness.  EXAM: CT HEAD WITHOUT CONTRAST  TECHNIQUE: Contiguous axial images were  obtained from the base of the skull through the vertex without intravenous contrast.  COMPARISON:  None.  FINDINGS: There is a relatively well-defined focus of hypoattenuation measuring 1.4 cm in the  posterior right cerebellar hemisphere (image 11) consistent with a small infarct, likely chronic. There is also a CSF density focus more superiorly in the right cerebellum just underneath the tentorium measuring 1.8 cm which may reflect a chronic infarct or less likely small arachnoid cyst. There may also be a tiny, chronic infarct in the superior left cerebellum. Chronic lacunar infarct is present in the head of the left caudate with mild ex vacuo dilatation of the frontal horn of left lateral ventricle. There is no definite evidence of acute large territory infarct, intracranial hemorrhage,, midline shift, or extra-axial fluid collection. There is no hydrocephalus.  Orbits are unremarkable. Mastoid air cells are clear. Focal right ethmoid air cell opacification is noted. Incidental note is made of persistent parietal foramina bilaterally.  IMPRESSION: 1. No acute intracranial hemorrhage or definite acute infarct identified. 2. Chronic infarcts in the cerebellum and left basal ganglia.   Electronically Signed   By: Logan Bores   On: 06/05/2014 15:00     EKG Interpretation   Date/Time:  Thursday June 05 2014 10:25:31 EST Ventricular Rate:  63 PR Interval:  128 QRS Duration: 98 QT Interval:  454 QTC Calculation: 464 R Axis:   0 Text Interpretation:  Normal sinus rhythm Minimal voltage criteria for  LVH, may be normal variant Inferior infarct , age undetermined Cannot rule  out Anterior infarct , age undetermined Abnormal ECG No significant change  since last tracing Confirmed by Kathrynn Humble, MD, Thelma Comp [phone]) on 06/05/2014  3:08:30 PM      MDM   Final diagnoses:  Dizziness  Orthostatic hypotension   Pt comes in with cc of 3 days of headaches, NEW, constant. He also has some dizziness,  reproducible with head movement and with standing. There is no vertigo. Also has some atypical vision complains. No focal findings here.  Orthostatics are POSITIVE. Fluids given. Meds reviewed, pt is not any new antihypertensives.[name]  Spoke with Dr. Nicole Kindred, Neurology, about the dizziness. After discussing the case, we have decided to get CT-A of the neck - given the significant risk factors. Pt will also get Neuro f/u if his sx persist despite iv hydration.   Varney Biles, MD 06/05/14 1518  5:41 PM CT shows signs of infarct, posterior. MRI brain ordered, and so is MRA to look for posterior circulation. If nothing acute -pt will be discharged with neuro f/u.  Varney Biles, MD 06/05/14 [place], MD 06/05/14 2703

## 2014-06-05 NOTE — ED Notes (Signed)
Dr. Nanavati at bedside 

## 2014-06-05 NOTE — ED Provider Notes (Signed)
Patient initially seen and evaluated by Dr. Kathrynn Humble for dizziness and will was being sent for MRI of brain and MRA of the head to evaluate possible stroke. Unfortunately, he has a prosthetic heart valve and is not able to give me enough information to be certain that his valve is one that is safe for MRI scanning. Therefore, he was switched to a CT angiogram with which showed multiple lesions in the left vertebral artery but none critical. He has been resting comfortably with no recurrence of dizziness. Orthostatic vital signs are unremarkable. He is discharged with instructions to follow-up with his PCP and with his neurologist.  Delora Fuel, MD 77/93/96 [id number]

## 2014-06-11 ENCOUNTER — Ambulatory Visit (INDEPENDENT_AMBULATORY_CARE_PROVIDER_SITE_OTHER): Payer: Medicare Other | Admitting: *Deleted

## 2014-06-11 DIAGNOSIS — I480 Paroxysmal atrial fibrillation: Secondary | ICD-10-CM | POA: Diagnosis not present

## 2014-06-11 DIAGNOSIS — Z5181 Encounter for therapeutic drug level monitoring: Secondary | ICD-10-CM

## 2014-06-11 LAB — POCT INR: INR: 1.6

## 2014-06-20 ENCOUNTER — Other Ambulatory Visit: Payer: Self-pay | Admitting: Cardiology

## 2014-07-02 ENCOUNTER — Ambulatory Visit (INDEPENDENT_AMBULATORY_CARE_PROVIDER_SITE_OTHER): Payer: Medicare Other | Admitting: *Deleted

## 2014-07-02 DIAGNOSIS — Z5181 Encounter for therapeutic drug level monitoring: Secondary | ICD-10-CM

## 2014-07-02 DIAGNOSIS — I480 Paroxysmal atrial fibrillation: Secondary | ICD-10-CM

## 2014-07-02 LAB — POCT INR: INR: 2.1

## 2014-07-15 NOTE — Progress Notes (Signed)
HPI: fu CAD and h/o spontaneous ascending aortic dissection with aortic valve involvement status post mechanical AVR with ascending aorta conduit in 2000. Cardiac catheterization in February 2015 showed a patent left main, LAD, circumflex and an occluded right coronary artery. The saphenous vein graft to the right coronary artery had an ostial 90% lesion. The patient had PCI of the saphenous vein graft to the right coronary artery. Fluoroscopy showed his valve opened well. Aortic angiogram showed a dilated root with residual dissection just distal to the origin of the innominate. Echocardiogram in February 2015 showed normal LV function, mechanical aortic valve with moderate to severe stenosis with a mean gradient of 36 mm of mercury (Note the mean gradient across his aortic valve had been elevated in the past). There was trace aortic insufficiency. There was biatrial enlargement, mild right ventricular enlargement and mild regurgitation. Moderately elevated pulmonary pressures. Transesophageal echocardiogram in February 2015 showed an ejection fraction of 50%, mild mitral regurgitation, pseudo-normal mitral inflow. CTA in March of 2015 showed postoperative changes of previous repair of aortic dissection with an intimal flap arising in the distal ascending aorta and extending to the origin of the left subclavian artery stable. Pulmonary arteries were enlarged. Patient seen by cardiothoracic surgery for elevated mean gradient during previous admission and this was felt to be patient valve mismatch. Followup echoes as well as CTs to follow aortic dissection recommended. Since he was last seen,   Current Outpatient Prescriptions  Medication Sig Dispense Refill  . azithromycin (ZITHROMAX Z-PAK) 250 MG tablet 2 po day one, then 1 daily x 4 days (Patient not taking: Reported on 06/05/2014) 5 tablet 0  . benzonatate (TESSALON) 100 MG capsule Take 1 capsule (100 mg total) by mouth every 8 (eight) hours.  (Patient not taking: Reported on 06/05/2014) 21 capsule 0  . cholecalciferol (VITAMIN D) 1000 UNITS tablet Take 1,000 Units by mouth daily.    . cloNIDine (CATAPRES) 0.1 MG tablet take 1 tablet by mouth twice a day 60 tablet 0  . clopidogrel (PLAVIX) 75 MG tablet Take 1 tablet (75 mg total) by mouth daily with breakfast. 30 tablet 0  . colchicine 0.6 MG tablet Take 1 tablet (0.6 mg total) by mouth daily. (Patient not taking: Reported on 06/05/2014) 12 tablet 0  . furosemide (LASIX) 40 MG tablet Take 1 tablet (40 mg total) by mouth 2 (two) times daily. 60 tablet 11  . glimepiride (AMARYL) 1 MG tablet take 1 tablet by mouth once daily BEFORE BREAKFAST 90 tablet 3  . glucose blood test strip 1 per day - Use as instructed (Patient taking differently: 1 each by Other route daily. ) 100 each 12  . Lancets MISC Use 1 per day as directed 100 each 12  . levothyroxine (SYNTHROID, LEVOTHROID) 50 MCG tablet Take 1 tablet (50 mcg total) by mouth daily. 90 tablet 3  . losartan (COZAAR) 100 MG tablet take 1 tablet by mouth once daily 30 tablet 10  . metFORMIN (GLUCOPHAGE) 1000 MG tablet take 1 tablet by mouth twice a day with food 60 tablet 5  . metoprolol (TOPROL-XL) 200 MG 24 hr tablet Take 1 tablet (200 mg total) by mouth daily. 60 tablet 6  . nitroGLYCERIN (NITROSTAT) 0.4 MG SL tablet Place 1 tablet (0.4 mg total) under the tongue every 5 (five) minutes x 3 doses as needed for chest pain. 25 tablet 2  . oxyCODONE-acetaminophen (PERCOCET/ROXICET) 5-325 MG per tablet Take 2 tablets by mouth every 4 (four) hours  as needed for severe pain. (Patient not taking: Reported on 06/05/2014) 15 tablet 0  . potassium chloride SA (K-DUR,KLOR-CON) 20 MEQ tablet take 2 tablets by mouth once daily (Patient not taking: Reported on 04/18/2014) 60 tablet 5  . potassium chloride SA (K-DUR,KLOR-CON) 20 MEQ tablet Take 20 mEq by mouth 2 (two) times daily.    . pravastatin (PRAVACHOL) 40 MG tablet take 1 tablet by mouth once daily 30  tablet 5  . warfarin (COUMADIN) 5 MG tablet Take as directed by Anticoagulation clinic (Patient taking differently: Take 5-7.5 mg by mouth daily at 6 PM. Take 5 mg daily except on 7.5 mg on Monday & Friday) 35 tablet 3   No current facility-administered medications for this visit.     Past Medical History  Diagnosis Date  . Diabetes mellitus   . Hypertension   . Unspecified vitamin D deficiency   . Hyperlipidemia   . H/O cardiovascular stress test     a. Myoview 2011: walked for 18min, images with EF 39%, prominent inf scar, mild reversibility. b. Cardiac CT 06/2010: no CAD, only mild plaque in prox RCA. c. EF normal by echo 2014.  [name] hematuria   . TRANSIENT ISCHEMIC ATTACK, HX OF     a. In 2004.  [name] NEPHROLITHIASIS, HX OF   . PAF (paroxysmal atrial fibrillation)     a. H/o such, with recurrence of coarse afib/flutter during ER consult 03/2012.  . Ascending aortic dissection     a. 2000 - with aortic valve involvement. S/p repair of aortic dissection with placement of mechanical AVR.  [name] Obesity   . H/O mechanical aortic valve replacement   . Cholelithiasis 08/22/2013  . CAD (coronary artery disease)     Past Surgical History  Procedure Laterality Date  . Appendectomy  1998  . Coronary artery bypass graft  2000  . Aortic valve replacement  2000  . Tee without cardioversion N/A 07/22/2013    Procedure: TRANSESOPHAGEAL ECHOCARDIOGRAM (TEE);  Surgeon: [name], MD;  Location: [facility] ENDOSCOPY;  Service: Cardiovascular;  Laterality: N/A;  . Left and right heart catheterization with coronary angiogram N/A 07/19/2013    Procedure: LEFT AND RIGHT HEART CATHETERIZATION WITH CORONARY ANGIOGRAM;  Surgeon: [name], MD;  Location: [facility] CATH LAB;  Service: Cardiovascular;  Laterality: N/A;    History   Social History  . Marital Status: Married    Spouse Name: N/A  . Number of Children: 4  . Years of Education: N/A   Occupational History  . DISABLED    Social History Main  Topics  . Smoking status: Never Smoker   . Smokeless tobacco: Not on file  . Alcohol Use: No  . Drug Use: No  . Sexual Activity: Not on file   Other Topics Concern  . Not on file   Social History Narrative    ROS: no fevers or chills, productive cough, hemoptysis, dysphasia, odynophagia, melena, hematochezia, dysuria, hematuria, rash, seizure activity, orthopnea, PND, pedal edema, claudication. Remaining systems are negative.  Physical Exam: Well-developed well-nourished in no acute distress.  Skin is warm and dry.  HEENT is normal.  Neck is supple.  Chest is clear to auscultation with normal expansion.  Cardiovascular exam is regular rate and rhythm.  Abdominal exam nontender or distended. No masses palpated. Extremities show no edema. neuro grossly intact  ECG     This encounter was created in error - please disregard.

## 2014-07-16 ENCOUNTER — Encounter: Payer: Medicare Other | Admitting: Cardiology

## 2014-07-23 ENCOUNTER — Ambulatory Visit (INDEPENDENT_AMBULATORY_CARE_PROVIDER_SITE_OTHER): Payer: Medicare Other | Admitting: *Deleted

## 2014-07-23 DIAGNOSIS — I480 Paroxysmal atrial fibrillation: Secondary | ICD-10-CM

## 2014-07-23 DIAGNOSIS — Z5181 Encounter for therapeutic drug level monitoring: Secondary | ICD-10-CM

## 2014-07-23 LAB — POCT INR: INR: 1.3

## 2014-07-30 ENCOUNTER — Ambulatory Visit (INDEPENDENT_AMBULATORY_CARE_PROVIDER_SITE_OTHER): Payer: Medicare Other | Admitting: *Deleted

## 2014-07-30 DIAGNOSIS — Z5181 Encounter for therapeutic drug level monitoring: Secondary | ICD-10-CM | POA: Diagnosis not present

## 2014-07-30 DIAGNOSIS — I480 Paroxysmal atrial fibrillation: Secondary | ICD-10-CM | POA: Diagnosis not present

## 2014-07-30 LAB — POCT INR: INR: 1.7

## 2014-08-04 ENCOUNTER — Other Ambulatory Visit: Payer: Self-pay | Admitting: Cardiology

## 2014-08-05 ENCOUNTER — Other Ambulatory Visit: Payer: Self-pay | Admitting: *Deleted

## 2014-08-05 MED ORDER — FUROSEMIDE 40 MG PO TABS: 40.0000 mg | ORAL_TABLET | Freq: Two times a day (BID) | ORAL | Status: DC

## 2014-08-13 ENCOUNTER — Ambulatory Visit (INDEPENDENT_AMBULATORY_CARE_PROVIDER_SITE_OTHER): Payer: Medicare Other | Admitting: *Deleted

## 2014-08-13 DIAGNOSIS — Z5181 Encounter for therapeutic drug level monitoring: Secondary | ICD-10-CM | POA: Diagnosis not present

## 2014-08-13 DIAGNOSIS — I480 Paroxysmal atrial fibrillation: Secondary | ICD-10-CM

## 2014-08-13 LAB — POCT INR: INR: 3

## 2014-08-26 ENCOUNTER — Other Ambulatory Visit: Payer: Self-pay | Admitting: Internal Medicine

## 2014-08-26 ENCOUNTER — Ambulatory Visit (INDEPENDENT_AMBULATORY_CARE_PROVIDER_SITE_OTHER): Payer: Medicare Other | Admitting: *Deleted

## 2014-08-26 ENCOUNTER — Ambulatory Visit (INDEPENDENT_AMBULATORY_CARE_PROVIDER_SITE_OTHER): Payer: Medicare Other | Admitting: Internal Medicine

## 2014-08-26 ENCOUNTER — Other Ambulatory Visit (INDEPENDENT_AMBULATORY_CARE_PROVIDER_SITE_OTHER): Payer: Medicare Other

## 2014-08-26 ENCOUNTER — Encounter: Payer: Self-pay | Admitting: Internal Medicine

## 2014-08-26 VITALS — BP 128/82 | HR 72 | Temp 97.9°F | Resp 18 | Ht 74.0 in | Wt 317.0 lb

## 2014-08-26 DIAGNOSIS — I5031 Acute diastolic (congestive) heart failure: Secondary | ICD-10-CM

## 2014-08-26 DIAGNOSIS — I480 Paroxysmal atrial fibrillation: Secondary | ICD-10-CM | POA: Diagnosis not present

## 2014-08-26 DIAGNOSIS — E785 Hyperlipidemia, unspecified: Secondary | ICD-10-CM

## 2014-08-26 DIAGNOSIS — I10 Essential (primary) hypertension: Secondary | ICD-10-CM | POA: Diagnosis not present

## 2014-08-26 DIAGNOSIS — N320 Bladder-neck obstruction: Secondary | ICD-10-CM | POA: Diagnosis not present

## 2014-08-26 DIAGNOSIS — E119 Type 2 diabetes mellitus without complications: Secondary | ICD-10-CM

## 2014-08-26 DIAGNOSIS — Z5181 Encounter for therapeutic drug level monitoring: Secondary | ICD-10-CM

## 2014-08-26 LAB — URINALYSIS, ROUTINE W REFLEX MICROSCOPIC
Bilirubin Urine: NEGATIVE
KETONES UR: NEGATIVE
LEUKOCYTES UA: NEGATIVE
Nitrite: NEGATIVE
SPECIFIC GRAVITY, URINE: 1.025 (ref 1.000–1.030)
Total Protein, Urine: NEGATIVE
UROBILINOGEN UA: 0.2 (ref 0.0–1.0)
Urine Glucose: NEGATIVE
pH: 5.5 (ref 5.0–8.0)

## 2014-08-26 LAB — CBC WITH DIFFERENTIAL/PLATELET
Basophils Absolute: 0 10*3/uL (ref 0.0–0.1)
Basophils Relative: 0.2 % (ref 0.0–3.0)
Eosinophils Absolute: 0.3 10*3/uL (ref 0.0–0.7)
Eosinophils Relative: 4.3 % (ref 0.0–5.0)
HEMATOCRIT: 40 % (ref 39.0–52.0)
HEMOGLOBIN: 13.8 g/dL (ref 13.0–17.0)
Lymphocytes Relative: 27.6 % (ref 12.0–46.0)
Lymphs Abs: 1.7 10*3/uL (ref 0.7–4.0)
MCHC: 34.4 g/dL (ref 30.0–36.0)
MCV: 82.5 fl (ref 78.0–100.0)
MONOS PCT: 10.3 % (ref 3.0–12.0)
Monocytes Absolute: 0.6 10*3/uL (ref 0.1–1.0)
NEUTROS ABS: 3.6 10*3/uL (ref 1.4–7.7)
Neutrophils Relative %: 57.6 % (ref 43.0–77.0)
PLATELETS: 171 10*3/uL (ref 150.0–400.0)
RBC: 4.84 Mil/uL (ref 4.22–5.81)
RDW: 15.3 % (ref 11.5–15.5)
WBC: 6.2 10*3/uL (ref 4.0–10.5)

## 2014-08-26 LAB — HEPATIC FUNCTION PANEL
ALK PHOS: 62 U/L (ref 39–117)
ALT: 14 U/L (ref 0–53)
AST: 21 U/L (ref 0–37)
Albumin: 3.9 g/dL (ref 3.5–5.2)
Bilirubin, Direct: 0.2 mg/dL (ref 0.0–0.3)
TOTAL PROTEIN: 7.5 g/dL (ref 6.0–8.3)
Total Bilirubin: 0.9 mg/dL (ref 0.2–1.2)

## 2014-08-26 LAB — TSH: TSH: 3.2 u[IU]/mL (ref 0.35–4.50)

## 2014-08-26 LAB — BASIC METABOLIC PANEL
BUN: 15 mg/dL (ref 6–23)
CALCIUM: 9.2 mg/dL (ref 8.4–10.5)
CO2: 28 mEq/L (ref 19–32)
Chloride: 101 mEq/L (ref 96–112)
Creatinine, Ser: 1.06 mg/dL (ref 0.40–1.50)
GFR: 95.28 mL/min (ref >60.00)
GLUCOSE: 244 mg/dL — AB (ref 70–99)
Potassium: 3.6 mEq/L (ref 3.5–5.1)
Sodium: 137 mEq/L (ref 135–145)

## 2014-08-26 LAB — LIPID PANEL
Cholesterol: 151 mg/dL (ref 0–200)
HDL: 34.6 mg/dL — ABNORMAL LOW (ref >39.00)
NonHDL: 116.4
TRIGLYCERIDES: 207 mg/dL — AB (ref 0.0–149.0)
Total CHOL/HDL Ratio: 4
VLDL: 41.4 mg/dL — ABNORMAL HIGH (ref 0.0–40.0)

## 2014-08-26 LAB — MICROALBUMIN / CREATININE URINE RATIO
CREATININE, U: 149 mg/dL
MICROALB UR: 6.1 mg/dL — AB (ref 0.0–1.9)
MICROALB/CREAT RATIO: 4.1 mg/g (ref 0.0–30.0)

## 2014-08-26 LAB — HEMOGLOBIN A1C: Hgb A1c MFr Bld: 8.2 % — ABNORMAL HIGH (ref 4.6–6.5)

## 2014-08-26 LAB — LDL CHOLESTEROL, DIRECT: LDL DIRECT: 75 mg/dL

## 2014-08-26 LAB — POCT INR: INR: 2.3

## 2014-08-26 LAB — PSA: PSA: 0.65 ng/mL (ref 0.10–4.00)

## 2014-08-26 MED ORDER — GLIPIZIDE ER 5 MG PO TB24: 5.0000 mg | ORAL_TABLET | Freq: Every day | ORAL | Status: DC

## 2014-08-26 NOTE — Assessment & Plan Note (Signed)
stable overall by history and exam, and pt to continue medical treatment as before,  to f/u any worsening symptoms or concerns 

## 2014-08-26 NOTE — Assessment & Plan Note (Signed)
stable overall by history and exam, recent data reviewed with pt, and pt to continue medical treatment as before,  to f/u any worsening symptoms or concerns BP Readings from Last 3 Encounters:  08/26/14 128/82  06/05/14 130/79  04/23/14 152/82

## 2014-08-26 NOTE — Progress Notes (Signed)
Pre visit review using our clinic review tool, if applicable. No additional management support is needed unless otherwise documented below in the visit note. 

## 2014-08-26 NOTE — Assessment & Plan Note (Signed)
stable overall by history and exam, recent data reviewed with pt, and pt to continue medical treatment as before,  to f/u any worsening symptoms or concerns Lab Results  Component Value Date   LDLCALC 75 02/21/2014

## 2014-08-26 NOTE — Patient Instructions (Addendum)

## 2014-08-26 NOTE — Progress Notes (Signed)
Subjective:    Patient ID: [name], male    DOB: 12-12-1965, 49 y.o.   MRN: [id number]  HPI  Here for yearly f/u;  Overall doing ok;  Pt denies Chest pain, worsening SOB, DOE, wheezing, orthopnea, PND, worsening LE edema, palpitations, dizziness or syncope.  Pt denies neurological change such as new headache, facial or extremity weakness.  Pt denies polydipsia, polyuria, or low sugar symptoms. Pt states overall good compliance with treatment and medications, good tolerability, and has been trying to follow appropriate diet.  Pt denies worsening depressive symptoms, suicidal ideation or panic. No fever, night sweats, wt loss, loss of appetite, or other constitutional symptoms.  Pt states good ability with ADL's, has low fall risk, home safety reviewed and adequate, no other significant changes in hearing or vision, and only occasionally active with exercise. In fact though going to gym 6 days per wk and trying to watch his diet, has gained wt. Wt Readings from Last 3 Encounters:  08/26/14 317 lb (143.79 kg)  02/21/14 314 lb 6 oz (142.6 kg)  10/31/13 290 lb (131.543 kg)  jan 2016  Dizziness resolved. Has appt for INR f/u today Past Medical History  Diagnosis Date  . Diabetes mellitus   . Hypertension   . Unspecified vitamin D deficiency   . Hyperlipidemia   . H/O cardiovascular stress test     a. Myoview 2011: walked for 9min, images with EF 39%, prominent inf scar, mild reversibility. b. Cardiac CT 06/2010: no CAD, only mild plaque in prox RCA. c. EF normal by echo 2014.  [name] hematuria   . TRANSIENT ISCHEMIC ATTACK, HX OF     a. In 2004.  [name] NEPHROLITHIASIS, HX OF   . PAF (paroxysmal atrial fibrillation)     a. H/o such, with recurrence of coarse afib/flutter during ER consult 03/2012.  . Ascending aortic dissection     a. 2000 - with aortic valve involvement. S/p repair of aortic dissection with placement of mechanical AVR.  [name] Obesity   . H/O mechanical aortic valve  replacement   . Cholelithiasis 08/22/2013  . CAD (coronary artery disease)    Past Surgical History  Procedure Laterality Date  . Appendectomy  1998  . Coronary artery bypass graft  2000  . Aortic valve replacement  2000  . Tee without cardioversion N/A 07/22/2013    Procedure: TRANSESOPHAGEAL ECHOCARDIOGRAM (TEE);  Surgeon: [name], MD;  Location: [facility] ENDOSCOPY;  Service: Cardiovascular;  Laterality: N/A;  . Left and right heart catheterization with coronary angiogram N/A 07/19/2013    Procedure: LEFT AND RIGHT HEART CATHETERIZATION WITH CORONARY ANGIOGRAM;  Surgeon: [name], MD;  Location: [facility] CATH LAB;  Service: Cardiovascular;  Laterality: N/A;    reports that he has never smoked. He does not have any smokeless tobacco history on file. He reports that he does not drink alcohol or use illicit drugs. family history includes Coronary artery disease in his mother; Coronary artery disease (age of onset: 62) in his other; Diabetes in his brother; Hypertension in his mother. Allergies  Allergen Reactions  . Insulin Glargine Swelling    edema   Current Outpatient Prescriptions on File Prior to Visit  Medication Sig Dispense Refill  . cholecalciferol (VITAMIN D) 1000 UNITS tablet Take 1,000 Units by mouth daily.    . cloNIDine (CATAPRES) 0.1 MG tablet take 1 tablet by mouth twice a day 60 tablet 1  . clopidogrel (PLAVIX) 75 MG tablet Take 1 tablet (75 mg total)  by mouth daily with breakfast. 30 tablet 0  . furosemide (LASIX) 40 MG tablet Take 1 tablet (40 mg total) by mouth 2 (two) times daily. 60 tablet 5  . glimepiride (AMARYL) 1 MG tablet take 1 tablet by mouth once daily BEFORE BREAKFAST 90 tablet 3  . glucose blood test strip 1 per day - Use as instructed (Patient taking differently: 1 each by Other route daily. ) 100 each 12  . Lancets MISC Use 1 per day as directed (Patient not taking: Reported on 08/26/2014) 100 each 12  . levothyroxine (SYNTHROID, LEVOTHROID) 50 MCG tablet  Take 1 tablet (50 mcg total) by mouth daily. (Patient not taking: Reported on 08/26/2014) 90 tablet 3  . losartan (COZAAR) 100 MG tablet take 1 tablet by mouth once daily 30 tablet 10  . metFORMIN (GLUCOPHAGE) 1000 MG tablet take 1 tablet by mouth twice a day with food 60 tablet 5  . metoprolol (TOPROL-XL) 200 MG 24 hr tablet Take 1 tablet (200 mg total) by mouth daily. 60 tablet 6  . nitroGLYCERIN (NITROSTAT) 0.4 MG SL tablet Place 1 tablet (0.4 mg total) under the tongue every 5 (five) minutes x 3 doses as needed for chest pain. (Patient not taking: Reported on 08/26/2014) 25 tablet 2  . oxyCODONE-acetaminophen (PERCOCET/ROXICET) 5-325 MG per tablet Take 2 tablets by mouth every 4 (four) hours as needed for severe pain. 15 tablet 0  . potassium chloride SA (K-DUR,KLOR-CON) 20 MEQ tablet take 2 tablets by mouth once daily 60 tablet 5  . potassium chloride SA (K-DUR,KLOR-CON) 20 MEQ tablet Take 20 mEq by mouth 2 (two) times daily.    . pravastatin (PRAVACHOL) 40 MG tablet take 1 tablet by mouth once daily 30 tablet 5  . warfarin (COUMADIN) 5 MG tablet Take as directed by Anticoagulation clinic (Patient taking differently: Take 5-7.5 mg by mouth daily at 6 PM. Take 5 mg daily except on 7.5 mg on Monday & Friday) 35 tablet 3   No current facility-administered medications on file prior to visit.    yReview of Systems Constitutional: Negative for increased diaphoresis, other activity, appetite or siginficant weight change other than noted HENT: Negative for worsening hearing loss, ear pain, facial swelling, mouth sores and neck stiffness.   Eyes: Negative for other worsening pain, redness or visual disturbance.  Respiratory: Negative for shortness of breath and wheezing  Cardiovascular: Negative for chest pain and palpitations.  Gastrointestinal: Negative for diarrhea, blood in stool, abdominal distention or other pain Genitourinary: Negative for hematuria, flank pain or change in urine volume.    Musculoskeletal: Negative for myalgias or other joint complaints.  Skin: Negative for color change and wound or drainage.  Neurological: Negative for syncope and numbness. other than noted Hematological: Negative for adenopathy. or other swelling Psychiatric/Behavioral: Negative for hallucinations, SI, self-injury, decreased concentration or other worsening agitation.      Objective:   Physical Exam BP 128/82 mmHg  Pulse 72  Temp(Src) 97.9 F (36.6 C) (Oral)  Resp 18  Ht 6\' 2"  (1.88 m)  Wt 317 lb (143.79 kg)  BMI 40.68 kg/m2  SpO2 96% VS noted,  Constitutional: Pt is oriented to person, place, and time. Appears well-developed and well-nourished, in no significant distress Head: Normocephalic and atraumatic.  Right Ear: External ear normal.  Left Ear: External ear normal.  Nose: Nose normal.  Mouth/Throat: Oropharynx is clear and moist.  Eyes: Conjunctivae and EOM are normal. Pupils are equal, round, and reactive to light.  Neck: Normal range of  motion. Neck supple. No JVD present. No tracheal deviation present or significant neck LA or mass Cardiovascular: Normal rate, regular rhythm, normal heart sounds and intact distal pulses.  + valvular click Pulmonary/Chest: Effort normal and breath sounds without rales or wheezing  Abdominal: Soft. Bowel sounds are normal. NT. No HSM  Musculoskeletal: Normal range of motion. Exhibits trace edema bilat, small amount more on the right  Lymphadenopathy:  Has no cervical adenopathy.  Neurological: Pt is alert and oriented to person, place, and time. Pt has normal reflexes. No cranial nerve deficit. Motor grossly intact Skin: Skin is warm and dry. No rash noted.  Psychiatric:  Has normal mood and affect. Behavior is normal.     Assessment & Plan:

## 2014-08-26 NOTE — Assessment & Plan Note (Signed)
stable overall by history and exam, recent data reviewed with pt, and pt to continue medical treatment as before,  to f/u any worsening symptoms or concerns Lab Results  Component Value Date   HGBA1C 7.2* 02/21/2014

## 2014-09-12 ENCOUNTER — Ambulatory Visit: Payer: Medicare Other | Admitting: Cardiology

## 2014-09-12 ENCOUNTER — Other Ambulatory Visit: Payer: Self-pay

## 2014-09-12 ENCOUNTER — Ambulatory Visit: Payer: Medicare Other | Admitting: Pharmacist Clinician (PhC)/ Clinical Pharmacy Specialist

## 2014-09-12 MED ORDER — METOPROLOL SUCCINATE ER 200 MG PO TB24: 200.0000 mg | ORAL_TABLET | Freq: Every day | ORAL | Status: DC

## 2014-10-06 ENCOUNTER — Encounter: Payer: Self-pay | Admitting: Cardiology

## 2014-10-06 ENCOUNTER — Ambulatory Visit (INDEPENDENT_AMBULATORY_CARE_PROVIDER_SITE_OTHER): Payer: Medicare Other | Admitting: Pharmacist Clinician (PhC)/ Clinical Pharmacy Specialist

## 2014-10-06 ENCOUNTER — Ambulatory Visit (INDEPENDENT_AMBULATORY_CARE_PROVIDER_SITE_OTHER): Payer: Medicare Other | Admitting: Cardiology

## 2014-10-06 VITALS — BP 120/76 | HR 56 | Ht 74.0 in | Wt 312.7 lb

## 2014-10-06 DIAGNOSIS — Z8679 Personal history of other diseases of the circulatory system: Secondary | ICD-10-CM

## 2014-10-06 DIAGNOSIS — Z5181 Encounter for therapeutic drug level monitoring: Secondary | ICD-10-CM | POA: Diagnosis not present

## 2014-10-06 DIAGNOSIS — Z9889 Other specified postprocedural states: Secondary | ICD-10-CM | POA: Diagnosis not present

## 2014-10-06 DIAGNOSIS — I480 Paroxysmal atrial fibrillation: Secondary | ICD-10-CM

## 2014-10-06 LAB — POCT INR: INR: 1.4

## 2014-10-06 NOTE — Patient Instructions (Signed)
Your physician has requested that you have an echocardiogram. Echocardiography is a painless test that uses sound waves to create images of your heart. It provides your doctor with information about the size and shape of your heart and how well your heart's chambers and valves are working. This procedure takes approximately one hour. There are no restrictions for this procedure.  You will be scheduled for a CT Angiogram of the chest to follow-up from the Aortic dissection surgery in 2000.  This will  Be scheduled at [facility].  Your physician recommends that you schedule a follow-up appointment in: One year with Dr. Stanford Breed.

## 2014-10-06 NOTE — Progress Notes (Signed)
Cardiology Office Note   Date:  10/06/2014   ID:  [name], DOB 1965/10/03, MRN [id number]  PCP:  Cathlean Cower, MD  Cardiologist:   Dr. Stanford Breed  Chief Complaint  Patient presents with  . Annual Exam    No complaints other than mild ankle edema.      History of Present Illness: [name] is a 49 y.o. male who presents for CAD follow up and aortic dissection.     He has a h/o spontaneous ascending aortic dissection with aortic valve involvement status post mechanical AVR with ascending aorta conduit in 2000. Cardiac catheterization in February 2015 showed a patent left main, LAD, circumflex and an occluded right coronary artery. The saphenous vein graft to the right coronary artery had an ostial 90% lesion. The patient had PCI of the saphenous vein graft to the right coronary artery. Fluoroscopy showed his valve opened well. Aortic angiogram showed a dilated root with residual dissection just distal to the origin of the innominate. Echocardiogram in February 2015 showed normal LV function, mechanical aortic valve with moderate to severe stenosis with a mean gradient of 36 mm of mercury (Note the mean gradient across his aortic valve had been elevated in the past). There was trace aortic insufficiency. There was biatrial enlargement, mild right ventricular enlargement and mild regurgitation. Moderately elevated pulmonary pressures. Transesophageal echocardiogram in February 2015 showed an ejection fraction of 50%, mild mitral regurgitation, pseudo-normal mitral inflow. CTA in March of 2015 showed postoperative changes of previous repair of aortic dissection with an intimal flap arising in the distal ascending aorta and extending to the origin of the left subclavian artery stable. Pulmonary arteries were enlarged. Patient seen by cardiothoracic surgery for elevated mean gradient during recent admission and this was felt to be patient valve mismatch. Followup echoes as well as CTs to  follow aortic dissection recommended. Since he was last seen, the patient denies any dyspnea on exertion, orthopnea, PND, pedal edema, palpitations, syncope or chest pain.  He exercises daily.  His diabetes is stable.  His diet is healthy- he eats 6 small meals a day.     Past Medical History  Diagnosis Date  . Diabetes mellitus   . Hypertension   . Unspecified vitamin D deficiency   . Hyperlipidemia   . H/O cardiovascular stress test     a. Myoview 2011: walked for 88min, images with EF 39%, prominent inf scar, mild reversibility. b. Cardiac CT 06/2010: no CAD, only mild plaque in prox RCA. c. EF normal by echo 2014.  [name] hematuria   . TRANSIENT ISCHEMIC ATTACK, HX OF     a. In 2004.  [name] NEPHROLITHIASIS, HX OF   . PAF (paroxysmal atrial fibrillation)     a. H/o such, with recurrence of coarse afib/flutter during ER consult 03/2012.  . Ascending aortic dissection     a. 2000 - with aortic valve involvement. S/p repair of aortic dissection with placement of mechanical AVR.  [name] Obesity   . H/O mechanical aortic valve replacement   . Cholelithiasis 08/22/2013  . CAD (coronary artery disease)     Past Surgical History  Procedure Laterality Date  . Appendectomy  1998  . Coronary artery bypass graft  2000  . Aortic valve replacement  2000  . Tee without cardioversion N/A 07/22/2013    Procedure: TRANSESOPHAGEAL ECHOCARDIOGRAM (TEE);  Surgeon: [name], MD;  Location: [facility] ENDOSCOPY;  Service: Cardiovascular;  Laterality: N/A;  . Left and right heart catheterization with coronary  angiogram N/A 07/19/2013    Procedure: LEFT AND RIGHT HEART CATHETERIZATION WITH CORONARY ANGIOGRAM;  Surgeon: [name], MD;  Location: [facility] CATH LAB;  Service: Cardiovascular;  Laterality: N/A;     Current Outpatient Prescriptions  Medication Sig Dispense Refill  . cholecalciferol (VITAMIN D) 1000 UNITS tablet Take 1,000 Units by mouth daily.    . cloNIDine (CATAPRES) 0.1 MG tablet take 1 tablet  by mouth twice a day 60 tablet 1  . furosemide (LASIX) 40 MG tablet Take 1 tablet (40 mg total) by mouth 2 (two) times daily. 60 tablet 5  . levothyroxine (SYNTHROID, LEVOTHROID) 50 MCG tablet Take 50 mcg by mouth daily before breakfast.    . losartan (COZAAR) 100 MG tablet take 1 tablet by mouth once daily 30 tablet 10  . metFORMIN (GLUCOPHAGE) 1000 MG tablet take 1 tablet by mouth twice a day with food 60 tablet 5  . metoprolol (TOPROL-XL) 200 MG 24 hr tablet Take 1 tablet (200 mg total) by mouth daily. 60 tablet 3  . nitroGLYCERIN (NITROSTAT) 0.4 MG SL tablet Place 1 tablet (0.4 mg total) under the tongue every 5 (five) minutes x 3 doses as needed for chest pain. 25 tablet 2  . potassium chloride SA (K-DUR,KLOR-CON) 20 MEQ tablet Take 20 mEq by mouth 2 (two) times daily.    . pravastatin (PRAVACHOL) 40 MG tablet take 1 tablet by mouth once daily 30 tablet 5  . warfarin (COUMADIN) 5 MG tablet Take as directed by Anticoagulation clinic (Patient taking differently: Take 5-7.5 mg by mouth daily at 6 PM. Take 5 mg daily except on 7.5 mg on Monday & Friday) 35 tablet 3   No current facility-administered medications for this visit.    Allergies:   Insulin glargine    Social History:  The patient  reports that he has never smoked. He does not have any smokeless tobacco history on file. He reports that he does not drink alcohol or use illicit drugs.   Family History:  The patient's family history includes Coronary artery disease in his mother; Coronary artery disease (age of onset: 22) in his other; Diabetes in his brother; Hypertension in his mother.    ROS:  General:no colds or fevers, no weight changes Skin:no rashes or ulcers HEENT:no blurred vision, no congestion CV:see HPI PUL:see HPI GI:no diarrhea constipation or melena, no indigestion GU:no hematuria, no dysuria MS:no joint pain, no claudication Neuro:no syncope, no lightheadedness Endo:+ diabetes, + thyroid disease followed by  PCP  Wt Readings from Last 3 Encounters:  10/06/14 312 lb 11.2 oz (141.84 kg)  08/26/14 317 lb (143.79 kg)  02/21/14 314 lb 6 oz (142.6 kg)     PHYSICAL EXAM: VS:  BP 120/76 mmHg  Pulse 56  Ht 6\' 2"  (1.88 m)  Wt 312 lb 11.2 oz (141.84 kg)  BMI 40.13 kg/m2 , BMI Body mass index is 40.13 kg/(m^2). General:Pleasant affect, NAD Skin:Warm and dry, brisk capillary refill HEENT:normocephalic, sclera clear, mucus membranes moist Neck:supple, no JVD, no bruits  Heart:S1S2 RRR with 2/6 systolic murmur, no gallup, rub or click Lungs:clear without rales, rhonchi, or wheezes EUM:PNTI, non tender, + BS, do not palpate liver spleen or masses Ext:no lower ext edema, 2+ pedal pulses, 2+ radial pulses Neuro:alert and oriented, MAE, follows commands, + facial symmetry    EKG:  EKG is NOT ordered today.    Recent Labs: 04/18/2014: Pro B Natriuretic peptide (BNP) 616.8* 08/26/2014: ALT 14; BUN 15; Creatinine 1.06; Hemoglobin 13.8; Platelets 171.0; Potassium  3.6; Sodium 137; TSH 3.20    Lipid Panel    Component Value Date/Time   CHOL 151 08/26/2014 0921   TRIG 207.0* 08/26/2014 0921   HDL 34.60* 08/26/2014 0921   CHOLHDL 4 08/26/2014 0921   VLDL 41.4* 08/26/2014 0921   LDLCALC 75 02/21/2014 1010   LDLDIRECT 75.0 08/26/2014 0921       Other studies Reviewed: Additional studies/ records that were reviewed today include: previous notes.   ASSESSMENT AND PLAN:  1. Essential HTN stable  2. CAD of artery of bypass graft.  VG-RCA BMS 06/2013  Stable no chest pain  3.  Ascending artic dissection in 2000 s/p [place] and MDT tilting disc AVR-  Will check Echo and CT angio of the chest.  If stable follow up with Dr. Stanford Breed in 1 year unless problems prior to that time  4.  Chronic diastolic HF- euvolemic stable.  5. Hyperlipidemia continue statin. Followed by PCP, LDL 41, HDL 34   Current medicines are reviewed with the patient today.  The patient Has no concerns regarding  medicines.  The following changes have been made:  See above Labs/ tests ordered today include:see above  Disposition:   FU:  see above  Signed, Isaiah Serge, NP  10/06/2014 10:07 AM    [address], [place], [place], [place] Phone: [phone]; Fax: [phone]

## 2014-10-08 ENCOUNTER — Other Ambulatory Visit: Payer: Self-pay | Admitting: Cardiology

## 2014-10-08 ENCOUNTER — Inpatient Hospital Stay (HOSPITAL_COMMUNITY)
Admission: RE | Admit: 2014-10-08 | Discharge: 2014-10-08 | Disposition: A | Discharge status: Home / Self Care | Payer: Medicare Other | Source: Ambulatory Visit | Attending: Cardiology | Admitting: Cardiology

## 2014-10-08 DIAGNOSIS — Z8679 Personal history of other diseases of the circulatory system: Secondary | ICD-10-CM

## 2014-10-08 DIAGNOSIS — Z9889 Other specified postprocedural states: Principal | ICD-10-CM

## 2014-10-09 ENCOUNTER — Telehealth: Payer: Self-pay | Admitting: Cardiology

## 2014-10-09 ENCOUNTER — Ambulatory Visit (HOSPITAL_COMMUNITY)
Admission: RE | Admit: 2014-10-09 | Discharge: 2014-10-09 | Disposition: A | Discharge status: Home / Self Care | Payer: Medicare Other | Source: Ambulatory Visit | Attending: Cardiology | Admitting: Cardiology

## 2014-10-09 ENCOUNTER — Encounter (HOSPITAL_COMMUNITY): Payer: Self-pay

## 2014-10-09 DIAGNOSIS — Z9889 Other specified postprocedural states: Secondary | ICD-10-CM

## 2014-10-09 DIAGNOSIS — Z8679 Personal history of other diseases of the circulatory system: Secondary | ICD-10-CM

## 2014-10-09 DIAGNOSIS — Z48812 Encounter for surgical aftercare following surgery on the circulatory system: Secondary | ICD-10-CM | POA: Diagnosis not present

## 2014-10-09 DIAGNOSIS — K8020 Calculus of gallbladder without cholecystitis without obstruction: Secondary | ICD-10-CM | POA: Insufficient documentation

## 2014-10-09 IMAGING — CT CT ANGIO CHEST
2 of 8 series · 18 of 46 positions shown · IV contrast (omnipaque)
Comparison: 07/24/2013, 03/24/2013, 12/31/2009

CLINICAL DATA: History of prior dissecting aneurysm of the thoracic
aorta with surgical repair

EXAM:
CT ANGIOGRAPHY CHEST WITH CONTRAST
TECHNIQUE: Multidetector CT imaging of the chest was performed using the
standard protocol during bolus administration of intravenous
contrast. Multiplanar CT image reconstructions and MIPs were
obtained to evaluate the vascular anatomy.
CONTRAST:  100 mL Omnipaque 350

[Series 6: dissection 2.0 i30f 1 · axial · 0.80mm/px · z∈[+1388,+1676]mm · 15 of 162 slices shown]
[im 9/162  lung]
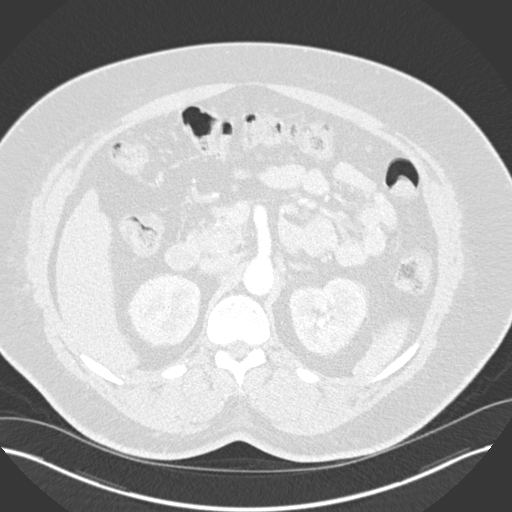
[im 17/162  soft-tissue]
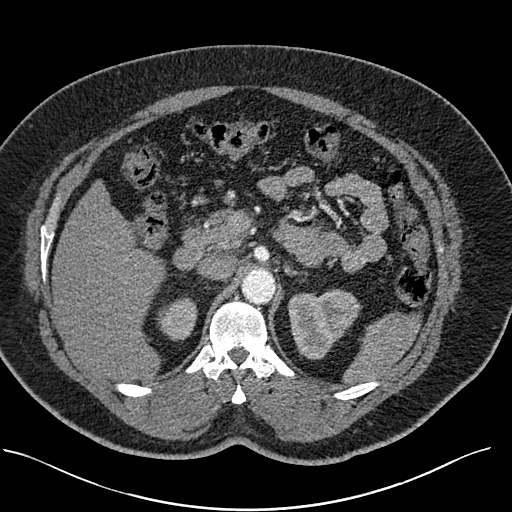
[im 34/162  lung]
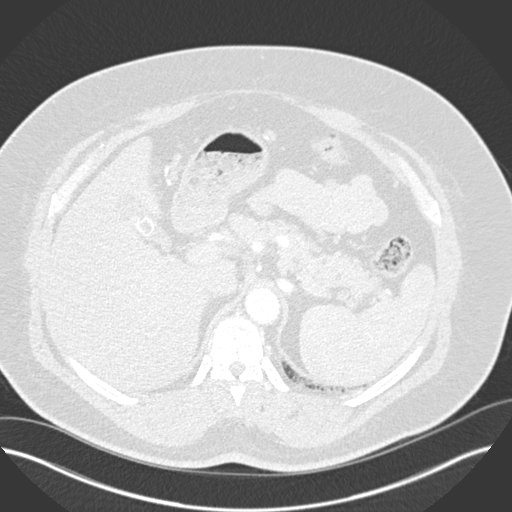
[im 43/162  soft-tissue]
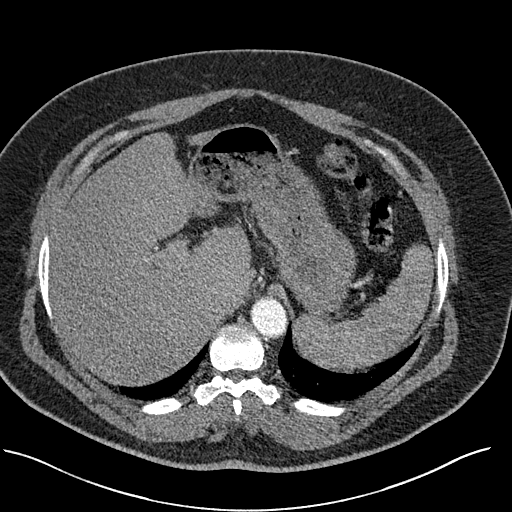
[im 51/162  lung]
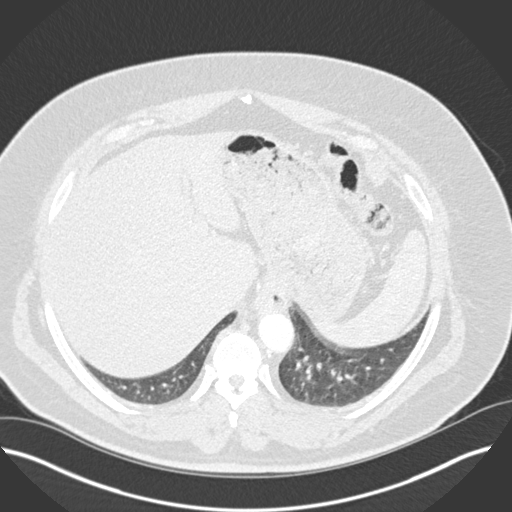
[im 60/162  soft-tissue]
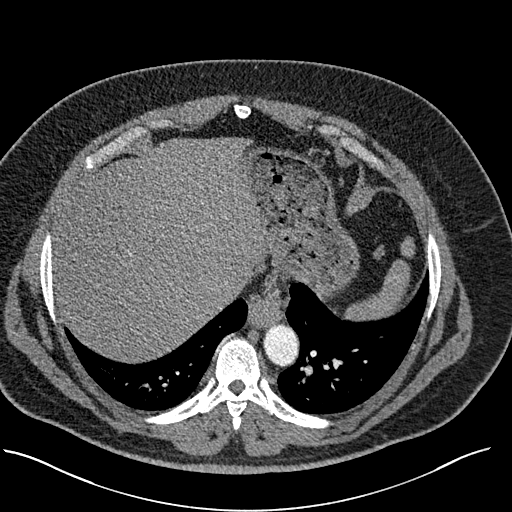
[im 68/162  lung]
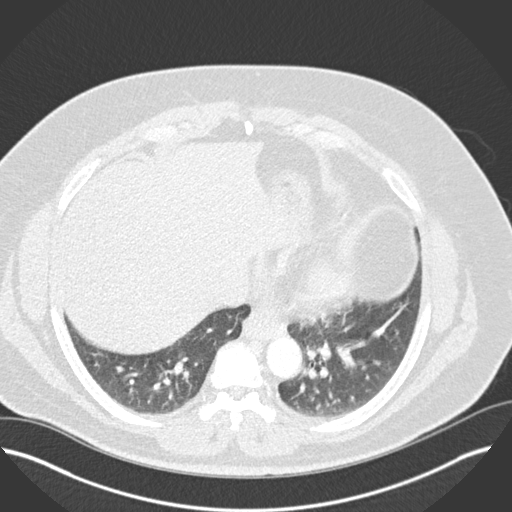
[im 85/162  soft-tissue]
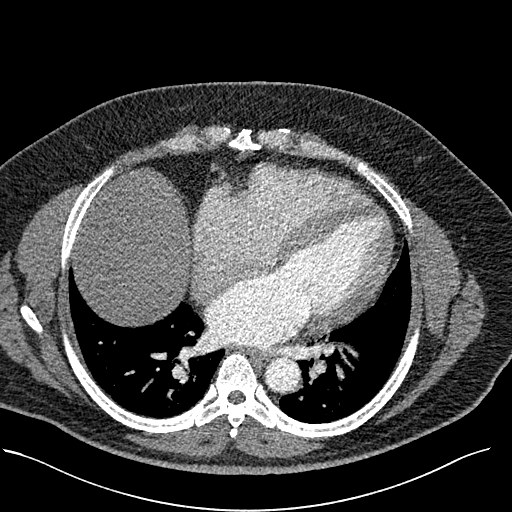
[im 94/162  lung]
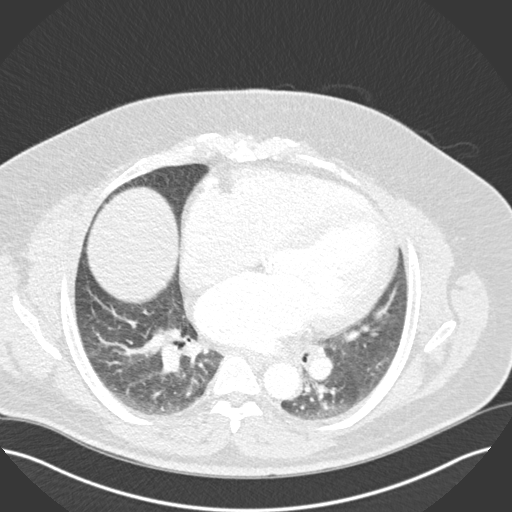
[im 102/162  soft-tissue]
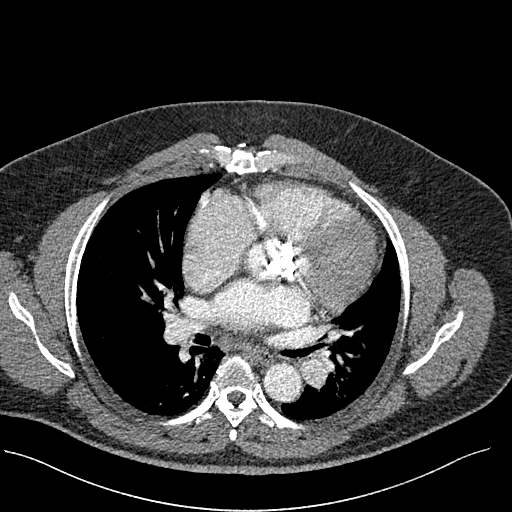
[im 111/162  lung]
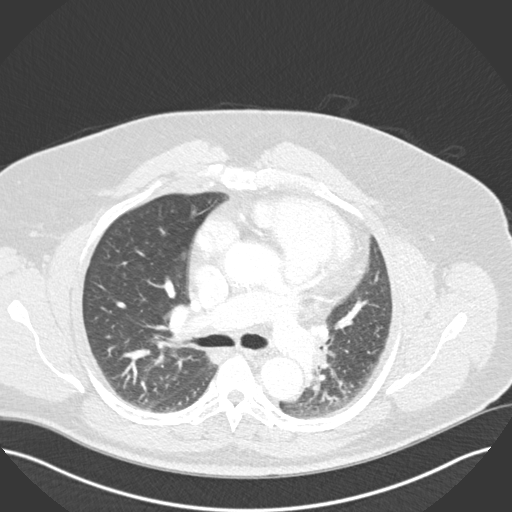
[im 119/162  soft-tissue]
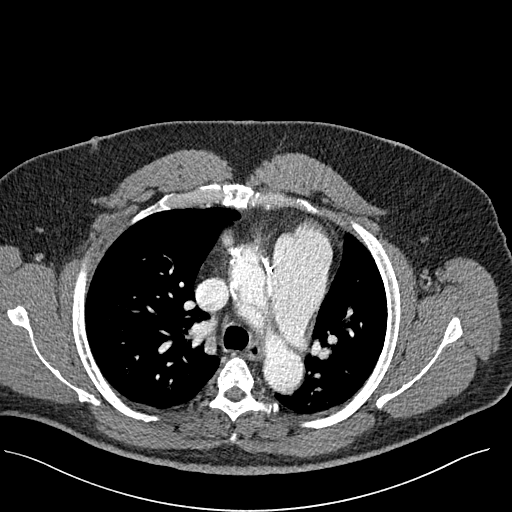
[im 136/162  lung]
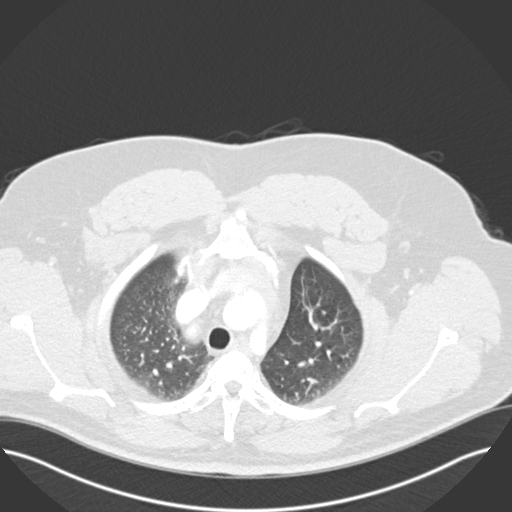
[im 145/162  soft-tissue]
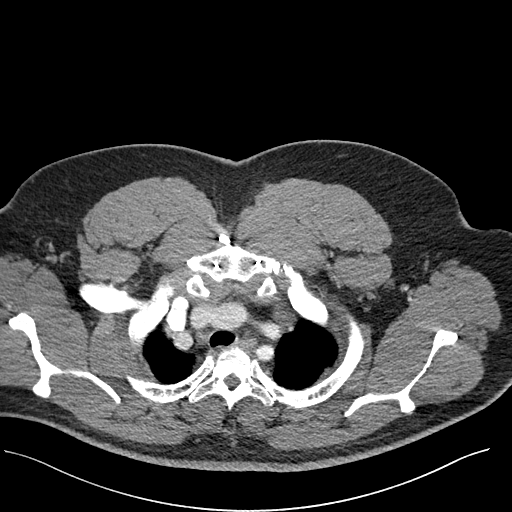
[im 153/162  lung]
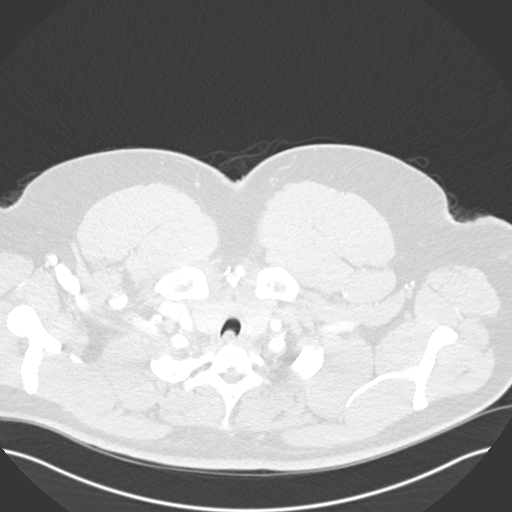

[Series 8: coronal mpr · coronal · 0.66mm/px · 3 of 151 slices shown]
[im 38/151  soft-tissue]
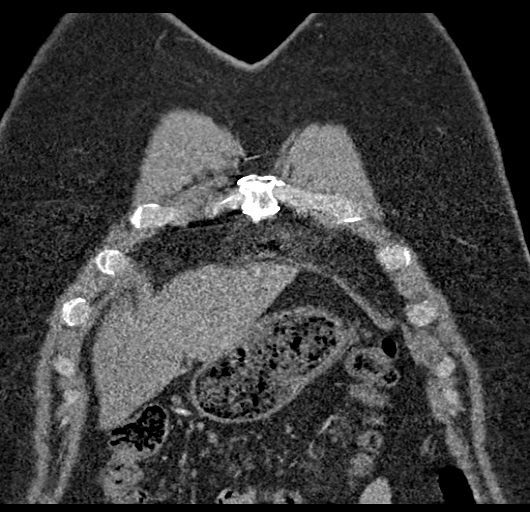
[im 76/151  soft-tissue]
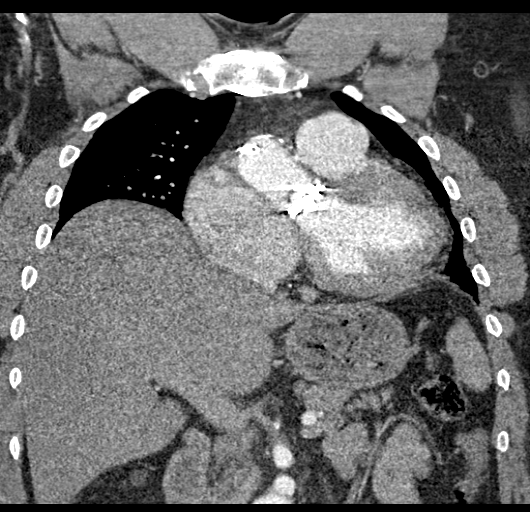
[im 113/151  soft-tissue]
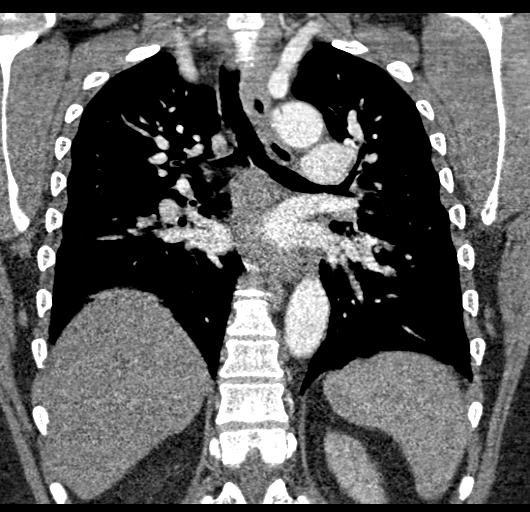

[18 of 46 positions shown; findings below may reference images not displayed]

FINDINGS: The lungs are well aerated bilaterally. No focal infiltrate or
sizable effusion is seen. No parenchymal nodule is noted.

There are changes consistent with aortic valve repair as well as
changes of prior dissection repair in the ascending aorta extending
into the aortic arch. A persistent intimal flap is noted which
arises just before the origin of the truncus anomaly and extends to
the origin of the left subclavian artery. A rent is noted in the
middle of this flap all of which is stable from the prior exam. The
descending thoracic aorta is within normal limits without evidence
of dissection. No periaortic hematoma or intramural hematoma is
noted.

The cardiac shadow is enlarged. The main pulmonary outflow tract is
enlarged. These changes are stable from the prior exam. No
definitive pulmonary embolism is seen. No hilar or mediastinal
adenopathy is seen.

The visualized upper abdomen reveals cholelithiasis stable in
appearance from the prior exam. No acute bony abnormality is noted.

Review of the MIP images confirms the above findings.
IMPRESSION: Status post aortic valve repair and aortic dissection repair. A
persistent flap is noted but stable in appearance from the prior
exams dating back to 0644.

Stable cholelithiasis.

No new focal abnormality is seen.

These results will be called to the ordering clinician or
representative by the Radiologist Assistant, and communication
documented in the PACS or zVision Dashboard.

## 2014-10-09 MED ORDER — IOHEXOL 350 MG/ML SOLN
100.0000 mL | Freq: Once | INTRAVENOUS | Status: AC | PRN
  Administered 2014-10-09: 100 mL via INTRAVENOUS

## 2014-10-09 NOTE — Telephone Encounter (Signed)
Radiologist had results of pt's CT angio chest called in to [place]:  "s/p aortic valve repair, aortic dissection repair  Persistent flap noted but stable in appearance from prior exam dating back to 2011. Stable cholelithiasis. No new focal abnormalities seen."  Routing to Dr. Stanford Breed and Hilda Blades.

## 2014-10-17 ENCOUNTER — Telehealth: Payer: Self-pay | Admitting: Cardiology

## 2014-10-17 ENCOUNTER — Telehealth (HOSPITAL_COMMUNITY): Payer: Self-pay | Admitting: *Deleted

## 2014-10-17 NOTE — Telephone Encounter (Signed)
Patient notified of CT chest results.   He needs to reschedule echo - will route message to [facility] to contact patient to reschedule echo (was a no show)

## 2014-10-17 NOTE — Telephone Encounter (Signed)
Pt would like his echo results from 10-08-14 please.

## 2014-10-21 ENCOUNTER — Ambulatory Visit (INDEPENDENT_AMBULATORY_CARE_PROVIDER_SITE_OTHER): Payer: Medicare Other | Admitting: Pharmacist

## 2014-10-21 DIAGNOSIS — Z5181 Encounter for therapeutic drug level monitoring: Secondary | ICD-10-CM

## 2014-10-21 LAB — POCT INR: INR: 1.5

## 2014-10-22 ENCOUNTER — Telehealth (HOSPITAL_COMMUNITY): Payer: Self-pay | Admitting: *Deleted

## 2014-10-27 NOTE — Telephone Encounter (Signed)
Per documentation, [name] has left a message for patient to call to schedule echo

## 2014-11-01 ENCOUNTER — Other Ambulatory Visit: Payer: Self-pay | Admitting: Internal Medicine

## 2014-11-01 ENCOUNTER — Other Ambulatory Visit: Payer: Self-pay | Admitting: Cardiology

## 2014-11-03 ENCOUNTER — Other Ambulatory Visit: Payer: Self-pay | Admitting: *Deleted

## 2014-11-03 MED ORDER — WARFARIN SODIUM 5 MG PO TABS: 5.0000 mg | ORAL_TABLET | ORAL | Status: DC

## 2014-11-03 NOTE — Telephone Encounter (Signed)
Rx(s) sent to pharmacy electronically.  

## 2014-11-04 ENCOUNTER — Other Ambulatory Visit: Payer: Self-pay | Admitting: Internal Medicine

## 2014-11-04 ENCOUNTER — Ambulatory Visit (INDEPENDENT_AMBULATORY_CARE_PROVIDER_SITE_OTHER): Payer: Medicare Other | Admitting: *Deleted

## 2014-11-04 DIAGNOSIS — I359 Nonrheumatic aortic valve disorder, unspecified: Secondary | ICD-10-CM

## 2014-11-04 DIAGNOSIS — G459 Transient cerebral ischemic attack, unspecified: Secondary | ICD-10-CM | POA: Diagnosis not present

## 2014-11-04 DIAGNOSIS — Z5181 Encounter for therapeutic drug level monitoring: Secondary | ICD-10-CM

## 2014-11-04 DIAGNOSIS — I4891 Unspecified atrial fibrillation: Secondary | ICD-10-CM | POA: Diagnosis not present

## 2014-11-04 DIAGNOSIS — Z8679 Personal history of other diseases of the circulatory system: Secondary | ICD-10-CM

## 2014-11-04 LAB — POCT INR: INR: 1.1

## 2014-11-14 ENCOUNTER — Ambulatory Visit (INDEPENDENT_AMBULATORY_CARE_PROVIDER_SITE_OTHER): Payer: Medicare Other | Admitting: *Deleted

## 2014-11-14 DIAGNOSIS — I480 Paroxysmal atrial fibrillation: Secondary | ICD-10-CM

## 2014-11-14 DIAGNOSIS — Z5181 Encounter for therapeutic drug level monitoring: Secondary | ICD-10-CM

## 2014-11-14 LAB — POCT INR: INR: 1.8

## 2014-11-25 ENCOUNTER — Ambulatory Visit (INDEPENDENT_AMBULATORY_CARE_PROVIDER_SITE_OTHER): Payer: Medicare Other | Admitting: Pharmacist Clinician (PhC)/ Clinical Pharmacy Specialist

## 2014-11-25 DIAGNOSIS — Z5181 Encounter for therapeutic drug level monitoring: Secondary | ICD-10-CM

## 2014-11-25 DIAGNOSIS — I480 Paroxysmal atrial fibrillation: Secondary | ICD-10-CM | POA: Diagnosis not present

## 2014-11-25 LAB — POCT INR: INR: 2.9

## 2014-12-17 ENCOUNTER — Ambulatory Visit (INDEPENDENT_AMBULATORY_CARE_PROVIDER_SITE_OTHER): Payer: Medicare Other | Admitting: *Deleted

## 2014-12-17 DIAGNOSIS — I359 Nonrheumatic aortic valve disorder, unspecified: Secondary | ICD-10-CM

## 2014-12-17 DIAGNOSIS — G459 Transient cerebral ischemic attack, unspecified: Secondary | ICD-10-CM | POA: Diagnosis not present

## 2014-12-17 DIAGNOSIS — I4891 Unspecified atrial fibrillation: Secondary | ICD-10-CM

## 2014-12-17 DIAGNOSIS — Z5181 Encounter for therapeutic drug level monitoring: Secondary | ICD-10-CM

## 2014-12-17 DIAGNOSIS — Z8679 Personal history of other diseases of the circulatory system: Secondary | ICD-10-CM | POA: Diagnosis not present

## 2014-12-17 LAB — POCT INR: INR: 1.8

## 2014-12-18 ENCOUNTER — Other Ambulatory Visit: Payer: Self-pay | Admitting: Internal Medicine

## 2014-12-30 ENCOUNTER — Other Ambulatory Visit: Payer: Self-pay | Admitting: *Deleted

## 2014-12-30 ENCOUNTER — Other Ambulatory Visit: Payer: Self-pay

## 2014-12-30 MED ORDER — POTASSIUM CHLORIDE CRYS ER 20 MEQ PO TBCR: 40.0000 meq | EXTENDED_RELEASE_TABLET | Freq: Every day | ORAL | Status: DC

## 2014-12-30 MED ORDER — CLONIDINE HCL 0.1 MG PO TABS: 0.1000 mg | ORAL_TABLET | Freq: Two times a day (BID) | ORAL | Status: DC

## 2014-12-30 MED ORDER — GLIMEPIRIDE 1 MG PO TABS: ORAL_TABLET | ORAL | Status: DC

## 2014-12-30 MED ORDER — FUROSEMIDE 40 MG PO TABS: 40.0000 mg | ORAL_TABLET | Freq: Two times a day (BID) | ORAL | Status: DC

## 2014-12-30 MED ORDER — PRAVASTATIN SODIUM 40 MG PO TABS: 40.0000 mg | ORAL_TABLET | Freq: Every day | ORAL | Status: DC

## 2014-12-30 MED ORDER — METFORMIN HCL 1000 MG PO TABS: 1000.0000 mg | ORAL_TABLET | Freq: Two times a day (BID) | ORAL | Status: DC

## 2014-12-30 MED ORDER — LOSARTAN POTASSIUM 100 MG PO TABS: 100.0000 mg | ORAL_TABLET | Freq: Every day | ORAL | Status: DC

## 2014-12-30 MED ORDER — LEVOTHYROXINE SODIUM 50 MCG PO TABS: 50.0000 ug | ORAL_TABLET | Freq: Every day | ORAL | Status: DC

## 2014-12-30 MED ORDER — METOPROLOL SUCCINATE ER 200 MG PO TB24: 200.0000 mg | ORAL_TABLET | Freq: Every day | ORAL | Status: DC

## 2014-12-31 ENCOUNTER — Ambulatory Visit (INDEPENDENT_AMBULATORY_CARE_PROVIDER_SITE_OTHER): Payer: Medicare Other | Admitting: *Deleted

## 2014-12-31 DIAGNOSIS — Z5181 Encounter for therapeutic drug level monitoring: Secondary | ICD-10-CM

## 2014-12-31 DIAGNOSIS — I480 Paroxysmal atrial fibrillation: Secondary | ICD-10-CM

## 2014-12-31 LAB — POCT INR: INR: 3.1

## 2015-01-01 ENCOUNTER — Other Ambulatory Visit: Payer: Self-pay

## 2015-01-01 MED ORDER — WARFARIN SODIUM 5 MG PO TABS: 5.0000 mg | ORAL_TABLET | ORAL | Status: DC

## 2015-01-05 ENCOUNTER — Other Ambulatory Visit: Payer: Self-pay | Admitting: *Deleted

## 2015-01-15 ENCOUNTER — Ambulatory Visit (INDEPENDENT_AMBULATORY_CARE_PROVIDER_SITE_OTHER): Payer: Medicare Other | Admitting: *Deleted

## 2015-01-15 DIAGNOSIS — Z5181 Encounter for therapeutic drug level monitoring: Secondary | ICD-10-CM | POA: Diagnosis not present

## 2015-01-15 DIAGNOSIS — I4891 Unspecified atrial fibrillation: Secondary | ICD-10-CM

## 2015-01-15 DIAGNOSIS — G459 Transient cerebral ischemic attack, unspecified: Secondary | ICD-10-CM

## 2015-01-15 DIAGNOSIS — Z8679 Personal history of other diseases of the circulatory system: Secondary | ICD-10-CM

## 2015-01-15 DIAGNOSIS — I359 Nonrheumatic aortic valve disorder, unspecified: Secondary | ICD-10-CM | POA: Diagnosis not present

## 2015-01-15 LAB — POCT INR: INR: 3.1

## 2015-01-29 ENCOUNTER — Ambulatory Visit (INDEPENDENT_AMBULATORY_CARE_PROVIDER_SITE_OTHER): Payer: Medicare Other | Admitting: *Deleted

## 2015-01-29 DIAGNOSIS — Z5181 Encounter for therapeutic drug level monitoring: Secondary | ICD-10-CM

## 2015-01-29 DIAGNOSIS — I480 Paroxysmal atrial fibrillation: Secondary | ICD-10-CM | POA: Diagnosis not present

## 2015-01-29 LAB — POCT INR: INR: 3.2

## 2015-02-16 ENCOUNTER — Ambulatory Visit (INDEPENDENT_AMBULATORY_CARE_PROVIDER_SITE_OTHER): Payer: Medicare Other | Admitting: *Deleted

## 2015-02-16 DIAGNOSIS — I480 Paroxysmal atrial fibrillation: Secondary | ICD-10-CM

## 2015-02-16 DIAGNOSIS — Z5181 Encounter for therapeutic drug level monitoring: Secondary | ICD-10-CM | POA: Diagnosis not present

## 2015-02-16 LAB — POCT INR: INR: 3.2

## 2015-03-05 ENCOUNTER — Encounter: Payer: Self-pay | Admitting: Internal Medicine

## 2015-03-05 ENCOUNTER — Ambulatory Visit (INDEPENDENT_AMBULATORY_CARE_PROVIDER_SITE_OTHER): Payer: Medicare Other | Admitting: Internal Medicine

## 2015-03-05 ENCOUNTER — Other Ambulatory Visit (INDEPENDENT_AMBULATORY_CARE_PROVIDER_SITE_OTHER): Payer: Medicare Other

## 2015-03-05 VITALS — BP 134/86 | HR 75 | Temp 98.2°F | Ht 74.0 in | Wt 323.0 lb

## 2015-03-05 DIAGNOSIS — E119 Type 2 diabetes mellitus without complications: Secondary | ICD-10-CM

## 2015-03-05 DIAGNOSIS — I10 Essential (primary) hypertension: Secondary | ICD-10-CM | POA: Diagnosis not present

## 2015-03-05 DIAGNOSIS — Z23 Encounter for immunization: Secondary | ICD-10-CM | POA: Diagnosis not present

## 2015-03-05 DIAGNOSIS — E785 Hyperlipidemia, unspecified: Secondary | ICD-10-CM

## 2015-03-05 LAB — HEPATIC FUNCTION PANEL
ALBUMIN: 4 g/dL (ref 3.5–5.2)
ALT: 14 U/L (ref 0–53)
AST: 19 U/L (ref 0–37)
Alkaline Phosphatase: 63 U/L (ref 39–117)
BILIRUBIN TOTAL: 0.8 mg/dL (ref 0.2–1.2)
Bilirubin, Direct: 0.1 mg/dL (ref 0.0–0.3)
Total Protein: 7.9 g/dL (ref 6.0–8.3)

## 2015-03-05 LAB — LIPID PANEL
Cholesterol: 156 mg/dL (ref 0–200)
HDL: 37 mg/dL — AB (ref >39.00)
NonHDL: 118.65
Total CHOL/HDL Ratio: 4
Triglycerides: 350 mg/dL — ABNORMAL HIGH (ref 0.0–149.0)
VLDL: 70 mg/dL — ABNORMAL HIGH (ref 0.0–40.0)

## 2015-03-05 LAB — BASIC METABOLIC PANEL
BUN: 14 mg/dL (ref 6–23)
CHLORIDE: 102 meq/L (ref 96–112)
CO2: 29 mEq/L (ref 19–32)
Calcium: 9.7 mg/dL (ref 8.4–10.5)
Creatinine, Ser: 1.13 mg/dL (ref 0.40–1.50)
GFR: 88.32 mL/min (ref >60.00)
GLUCOSE: 250 mg/dL — AB (ref 70–99)
POTASSIUM: 3.7 meq/L (ref 3.5–5.1)
Sodium: 139 mEq/L (ref 135–145)

## 2015-03-05 LAB — LDL CHOLESTEROL, DIRECT: Direct LDL: 63 mg/dL

## 2015-03-05 LAB — TSH: TSH: 5.21 u[IU]/mL — AB (ref 0.35–4.50)

## 2015-03-05 LAB — HEMOGLOBIN A1C: HEMOGLOBIN A1C: 7.9 % — AB (ref 4.6–6.5)

## 2015-03-05 NOTE — Progress Notes (Signed)
Subjective:    Patient ID: [name], male    DOB: 05-10-1966, 49 y.o.   MRN: [id number]  HPI  Here to f/u; overall doing ok,  Pt denies chest pain, increasing sob or doe, wheezing, orthopnea, PND, increased LE swelling, palpitations, dizziness or syncope.  Pt denies new neurological symptoms such as new headache, or facial or extremity weakness or numbness.  Pt denies polydipsia, polyuria, or low sugar episode.   Pt denies new neurological symptoms such as new headache, or facial or extremity weakness or numbness.   Pt states overall good compliance with meds, mostly trying to follow appropriate diet, with wt overall stable,  but little exercise however.  Hard to lose wt, seems to get to 290, then bounces back up again.  Past Medical History  Diagnosis Date  . Diabetes mellitus   . Hypertension   . Unspecified vitamin D deficiency   . Hyperlipidemia   . H/O cardiovascular stress test     a. Myoview 2011: walked for 45min, images with EF 39%, prominent inf scar, mild reversibility. b. Cardiac CT 06/2010: no CAD, only mild plaque in prox RCA. c. EF normal by echo 2014.  [name] hematuria   . TRANSIENT ISCHEMIC ATTACK, HX OF     a. In 2004.  [name] NEPHROLITHIASIS, HX OF   . PAF (paroxysmal atrial fibrillation) ([place])     a. H/o such, with recurrence of coarse afib/flutter during ER consult 03/2012.  . Ascending aortic dissection ([place])     a. 2000 - with aortic valve involvement. S/p repair of aortic dissection with placement of mechanical AVR.  [name] Obesity   . H/O mechanical aortic valve replacement   . Cholelithiasis 08/22/2013  . CAD (coronary artery disease)    Past Surgical History  Procedure Laterality Date  . Appendectomy  1998  . Coronary artery bypass graft  2000  . Aortic valve replacement  2000  . Tee without cardioversion N/A 07/22/2013    Procedure: TRANSESOPHAGEAL ECHOCARDIOGRAM (TEE);  Surgeon: [name], MD;  Location: [facility] ENDOSCOPY;  Service: Cardiovascular;  Laterality:  N/A;  . Left and right heart catheterization with coronary angiogram N/A 07/19/2013    Procedure: LEFT AND RIGHT HEART CATHETERIZATION WITH CORONARY ANGIOGRAM;  Surgeon: [name], MD;  Location: [facility] CATH LAB;  Service: Cardiovascular;  Laterality: N/A;    reports that he has never smoked. He does not have any smokeless tobacco history on file. He reports that he does not drink alcohol or use illicit drugs. family history includes Coronary artery disease in his mother; Coronary artery disease (age of onset: 37) in his other; Diabetes in his brother; Hypertension in his mother. Allergies  Allergen Reactions  . Insulin Glargine Swelling    edema   Current Outpatient Prescriptions on File Prior to Visit  Medication Sig Dispense Refill  . cholecalciferol (VITAMIN D) 1000 UNITS tablet Take 1,000 Units by mouth daily.    . cloNIDine (CATAPRES) 0.1 MG tablet Take 1 tablet (0.1 mg total) by mouth 2 (two) times daily. 180 tablet 7  . furosemide (LASIX) 40 MG tablet Take 1 tablet (40 mg total) by mouth 2 (two) times daily. 180 tablet 6  . glimepiride (AMARYL) 1 MG tablet take 1 tablet by mouth once daily WITH BREAKFAST 90 tablet 3  . levothyroxine (SYNTHROID, LEVOTHROID) 50 MCG tablet Take 1 tablet (50 mcg total) by mouth daily. 90 tablet 1  . losartan (COZAAR) 100 MG tablet Take 1 tablet (100 mg total) by  mouth daily. 90 tablet 1  . metFORMIN (GLUCOPHAGE) 1000 MG tablet Take 1 tablet (1,000 mg total) by mouth 2 (two) times daily with a meal. 180 tablet 1  . metoprolol (TOPROL-XL) 200 MG 24 hr tablet Take 1 tablet (200 mg total) by mouth daily. 90 tablet 3  . nitroGLYCERIN (NITROSTAT) 0.4 MG SL tablet Place 1 tablet (0.4 mg total) under the tongue every 5 (five) minutes x 3 doses as needed for chest pain. 25 tablet 2  . potassium chloride SA (K-DUR,KLOR-CON) 20 MEQ tablet Take 2 tablets (40 mEq total) by mouth daily. 180 tablet 3  . pravastatin (PRAVACHOL) 40 MG tablet Take 1 tablet (40 mg  total) by mouth daily. 30 tablet 5  . warfarin (COUMADIN) 5 MG tablet Take 1 tablet (5 mg total) by mouth as directed. Take as directed by Anticoagulation clinic 135 tablet 1   No current facility-administered medications on file prior to visit.     Review of Systems  Constitutional: Negative for unusual diaphoresis or night sweats HENT: Negative for ringing in ear or discharge Eyes: Negative for double vision or worsening visual disturbance.  Respiratory: Negative for choking and stridor.   Gastrointestinal: Negative for vomiting or other signifcant bowel change Genitourinary: Negative for hematuria or change in urine volume.  Musculoskeletal: Negative for other MSK pain or swelling Skin: Negative for color change and worsening wound.  Neurological: Negative for tremors and numbness other than noted  Psychiatric/Behavioral: Negative for decreased concentration or agitation other than above       Objective:   Physical Exam BP 134/86 mmHg  Pulse 75  Temp(Src) 98.2 F (36.8 C) (Oral)  Ht 6\' 2"  (1.88 m)  Wt 323 lb (146.512 kg)  BMI 41.45 kg/m2  SpO2 96% VS noted,  Constitutional: Pt appears in no significant distress HENT: Head: NCAT.  Right Ear: External ear normal.  Left Ear: External ear normal.  Eyes: . Pupils are equal, round, and reactive to light. Conjunctivae and EOM are normal Neck: Normal range of motion. Neck supple.  Cardiovascular: Normal rate and regular rhythm.   Pulmonary/Chest: Effort normal and breath sounds without rales or wheezing.  Abd:  Soft, NT, ND, + BS Neurological: Pt is alert. Not confused , motor grossly intact Skin: Skin is warm. No rash, no LE edema Psychiatric: Pt behavior is normal. No agitation.     Assessment & Plan:

## 2015-03-05 NOTE — Assessment & Plan Note (Signed)
stable overall by history and exam, recent data reviewed with pt, and pt to continue medical treatment as before,  to f/u any worsening symptoms or concerns Lab Results  Component Value Date   LDLCALC 75 02/21/2014    

## 2015-03-05 NOTE — Patient Instructions (Signed)

## 2015-03-05 NOTE — Progress Notes (Signed)
Pre visit review using our clinic review tool, if applicable. No additional management support is needed unless otherwise documented below in the visit note. 

## 2015-03-05 NOTE — Assessment & Plan Note (Signed)
stable overall by history and exam, recent data reviewed with pt, and pt to continue medical treatment as before,  to f/u any worsening symptoms or concerns. BP Readings from Last 3 Encounters:  03/05/15 134/86  10/06/14 120/76  08/26/14 128/82

## 2015-03-05 NOTE — Assessment & Plan Note (Signed)
stable overall by history and exam, recent data reviewed with pt, and pt to continue medical treatment as before,  to f/u any worsening symptoms or concerns Lab Results  Component Value Date   HGBA1C 8.2* 08/26/2014   For fu lab on new oha from last visit

## 2015-03-06 ENCOUNTER — Other Ambulatory Visit: Payer: Self-pay | Admitting: Internal Medicine

## 2015-03-06 MED ORDER — GLIMEPIRIDE 1 MG PO TABS: ORAL_TABLET | ORAL | Status: DC

## 2015-03-09 ENCOUNTER — Ambulatory Visit (INDEPENDENT_AMBULATORY_CARE_PROVIDER_SITE_OTHER): Payer: Medicare Other | Admitting: *Deleted

## 2015-03-09 DIAGNOSIS — I480 Paroxysmal atrial fibrillation: Secondary | ICD-10-CM

## 2015-03-09 DIAGNOSIS — Z5181 Encounter for therapeutic drug level monitoring: Secondary | ICD-10-CM | POA: Diagnosis not present

## 2015-03-09 LAB — POCT INR: INR: 1.1

## 2015-03-16 ENCOUNTER — Ambulatory Visit (INDEPENDENT_AMBULATORY_CARE_PROVIDER_SITE_OTHER): Payer: Medicare Other

## 2015-03-16 DIAGNOSIS — I480 Paroxysmal atrial fibrillation: Secondary | ICD-10-CM | POA: Diagnosis not present

## 2015-03-16 DIAGNOSIS — Z5181 Encounter for therapeutic drug level monitoring: Secondary | ICD-10-CM | POA: Diagnosis not present

## 2015-03-16 LAB — POCT INR: INR: 1.6

## 2015-03-31 ENCOUNTER — Ambulatory Visit (INDEPENDENT_AMBULATORY_CARE_PROVIDER_SITE_OTHER): Payer: Medicare Other | Admitting: *Deleted

## 2015-03-31 DIAGNOSIS — Z5181 Encounter for therapeutic drug level monitoring: Secondary | ICD-10-CM | POA: Diagnosis not present

## 2015-03-31 DIAGNOSIS — I480 Paroxysmal atrial fibrillation: Secondary | ICD-10-CM

## 2015-03-31 LAB — POCT INR: INR: 1.8

## 2015-04-14 ENCOUNTER — Ambulatory Visit (INDEPENDENT_AMBULATORY_CARE_PROVIDER_SITE_OTHER): Payer: Medicare Other | Admitting: *Deleted

## 2015-04-14 DIAGNOSIS — Z5181 Encounter for therapeutic drug level monitoring: Secondary | ICD-10-CM | POA: Diagnosis not present

## 2015-04-14 DIAGNOSIS — I480 Paroxysmal atrial fibrillation: Secondary | ICD-10-CM

## 2015-04-14 LAB — POCT INR: INR: 3.8

## 2015-04-28 ENCOUNTER — Ambulatory Visit (INDEPENDENT_AMBULATORY_CARE_PROVIDER_SITE_OTHER): Payer: Medicare Other | Admitting: *Deleted

## 2015-04-28 DIAGNOSIS — I4891 Unspecified atrial fibrillation: Secondary | ICD-10-CM

## 2015-04-28 DIAGNOSIS — Z8679 Personal history of other diseases of the circulatory system: Secondary | ICD-10-CM | POA: Diagnosis not present

## 2015-04-28 DIAGNOSIS — I359 Nonrheumatic aortic valve disorder, unspecified: Secondary | ICD-10-CM | POA: Diagnosis not present

## 2015-04-28 DIAGNOSIS — G459 Transient cerebral ischemic attack, unspecified: Secondary | ICD-10-CM

## 2015-04-28 DIAGNOSIS — Z5181 Encounter for therapeutic drug level monitoring: Secondary | ICD-10-CM | POA: Diagnosis not present

## 2015-04-28 LAB — POCT INR: INR: 3.4

## 2015-05-08 ENCOUNTER — Ambulatory Visit (INDEPENDENT_AMBULATORY_CARE_PROVIDER_SITE_OTHER): Payer: Medicare Other | Admitting: *Deleted

## 2015-05-08 DIAGNOSIS — Z5181 Encounter for therapeutic drug level monitoring: Secondary | ICD-10-CM

## 2015-05-08 DIAGNOSIS — I480 Paroxysmal atrial fibrillation: Secondary | ICD-10-CM | POA: Diagnosis not present

## 2015-05-08 LAB — POCT INR: INR: 3.7

## 2015-05-13 ENCOUNTER — Other Ambulatory Visit: Payer: Self-pay | Admitting: Internal Medicine

## 2015-05-22 ENCOUNTER — Ambulatory Visit (INDEPENDENT_AMBULATORY_CARE_PROVIDER_SITE_OTHER): Payer: Medicare Other | Admitting: Pharmacist

## 2015-05-22 DIAGNOSIS — Z5181 Encounter for therapeutic drug level monitoring: Secondary | ICD-10-CM

## 2015-05-22 DIAGNOSIS — I480 Paroxysmal atrial fibrillation: Secondary | ICD-10-CM

## 2015-05-22 LAB — POCT INR: INR: 1.8

## 2015-06-05 ENCOUNTER — Ambulatory Visit (INDEPENDENT_AMBULATORY_CARE_PROVIDER_SITE_OTHER): Payer: Medicare Other | Admitting: *Deleted

## 2015-06-05 DIAGNOSIS — Z5181 Encounter for therapeutic drug level monitoring: Secondary | ICD-10-CM

## 2015-06-05 DIAGNOSIS — I480 Paroxysmal atrial fibrillation: Secondary | ICD-10-CM

## 2015-06-05 LAB — POCT INR: INR: 2.5

## 2015-06-19 ENCOUNTER — Ambulatory Visit (INDEPENDENT_AMBULATORY_CARE_PROVIDER_SITE_OTHER): Payer: Medicare Other | Admitting: *Deleted

## 2015-06-19 DIAGNOSIS — Z5181 Encounter for therapeutic drug level monitoring: Secondary | ICD-10-CM | POA: Diagnosis not present

## 2015-06-19 DIAGNOSIS — I480 Paroxysmal atrial fibrillation: Secondary | ICD-10-CM

## 2015-06-19 LAB — POCT INR: INR: 1.8

## 2015-07-01 ENCOUNTER — Telehealth: Payer: Self-pay | Admitting: Cardiology

## 2015-07-01 DIAGNOSIS — Z8679 Personal history of other diseases of the circulatory system: Secondary | ICD-10-CM

## 2015-07-01 DIAGNOSIS — Z9889 Other specified postprocedural states: Principal | ICD-10-CM

## 2015-07-01 NOTE — Telephone Encounter (Signed)
Will verify w/ Dr. Stanford Breed if OK to schedule repeat echo. Missed 1 year ago.

## 2015-07-01 NOTE — Telephone Encounter (Signed)
Ok for echo [name]  

## 2015-07-01 NOTE — Telephone Encounter (Signed)
Echo ordered. Note sent to schedulers.

## 2015-07-01 NOTE — Telephone Encounter (Signed)
New message    Patient has an order in the system for echo date  09/2014 . Need an current order patient is asking to have echo done when he's in the office on Friday.

## 2015-07-03 ENCOUNTER — Ambulatory Visit (INDEPENDENT_AMBULATORY_CARE_PROVIDER_SITE_OTHER): Payer: Medicare Other | Admitting: *Deleted

## 2015-07-03 DIAGNOSIS — I359 Nonrheumatic aortic valve disorder, unspecified: Secondary | ICD-10-CM

## 2015-07-03 DIAGNOSIS — I4891 Unspecified atrial fibrillation: Secondary | ICD-10-CM

## 2015-07-03 DIAGNOSIS — Z5181 Encounter for therapeutic drug level monitoring: Secondary | ICD-10-CM

## 2015-07-03 DIAGNOSIS — G459 Transient cerebral ischemic attack, unspecified: Secondary | ICD-10-CM | POA: Diagnosis not present

## 2015-07-03 DIAGNOSIS — Z8679 Personal history of other diseases of the circulatory system: Secondary | ICD-10-CM | POA: Diagnosis not present

## 2015-07-03 LAB — POCT INR: INR: 1.7

## 2015-07-09 ENCOUNTER — Other Ambulatory Visit: Payer: Self-pay | Admitting: Internal Medicine

## 2015-07-14 ENCOUNTER — Ambulatory Visit (HOSPITAL_COMMUNITY): Payer: Medicare Other | Attending: Cardiology

## 2015-07-14 ENCOUNTER — Other Ambulatory Visit: Payer: Self-pay

## 2015-07-14 ENCOUNTER — Ambulatory Visit (INDEPENDENT_AMBULATORY_CARE_PROVIDER_SITE_OTHER): Payer: Medicare Other

## 2015-07-14 DIAGNOSIS — I119 Hypertensive heart disease without heart failure: Secondary | ICD-10-CM | POA: Diagnosis not present

## 2015-07-14 DIAGNOSIS — I340 Nonrheumatic mitral (valve) insufficiency: Secondary | ICD-10-CM | POA: Insufficient documentation

## 2015-07-14 DIAGNOSIS — Z8679 Personal history of other diseases of the circulatory system: Secondary | ICD-10-CM | POA: Diagnosis not present

## 2015-07-14 DIAGNOSIS — I480 Paroxysmal atrial fibrillation: Secondary | ICD-10-CM | POA: Diagnosis not present

## 2015-07-14 DIAGNOSIS — E785 Hyperlipidemia, unspecified: Secondary | ICD-10-CM | POA: Diagnosis not present

## 2015-07-14 DIAGNOSIS — I351 Nonrheumatic aortic (valve) insufficiency: Secondary | ICD-10-CM | POA: Insufficient documentation

## 2015-07-14 DIAGNOSIS — Z952 Presence of prosthetic heart valve: Secondary | ICD-10-CM | POA: Diagnosis not present

## 2015-07-14 DIAGNOSIS — Z9889 Other specified postprocedural states: Secondary | ICD-10-CM

## 2015-07-14 DIAGNOSIS — E119 Type 2 diabetes mellitus without complications: Secondary | ICD-10-CM | POA: Diagnosis not present

## 2015-07-14 DIAGNOSIS — Z6841 Body Mass Index (BMI) 40.0 and over, adult: Secondary | ICD-10-CM | POA: Diagnosis not present

## 2015-07-14 DIAGNOSIS — Z5181 Encounter for therapeutic drug level monitoring: Secondary | ICD-10-CM

## 2015-07-14 LAB — POCT INR: INR: 2.3

## 2015-07-27 ENCOUNTER — Other Ambulatory Visit: Payer: Self-pay | Admitting: Internal Medicine

## 2015-08-03 ENCOUNTER — Encounter (HOSPITAL_COMMUNITY): Payer: Self-pay | Admitting: Emergency Medicine

## 2015-08-03 ENCOUNTER — Emergency Department (HOSPITAL_COMMUNITY): Payer: Medicare Other

## 2015-08-03 ENCOUNTER — Emergency Department (HOSPITAL_COMMUNITY)
Admission: EM | Admit: 2015-08-03 | Discharge: 2015-08-03 | Disposition: A | Discharge status: Home / Self Care | Payer: Medicare Other | Attending: Emergency Medicine | Admitting: Emergency Medicine

## 2015-08-03 DIAGNOSIS — E785 Hyperlipidemia, unspecified: Secondary | ICD-10-CM | POA: Insufficient documentation

## 2015-08-03 DIAGNOSIS — I10 Essential (primary) hypertension: Secondary | ICD-10-CM | POA: Insufficient documentation

## 2015-08-03 DIAGNOSIS — M109 Gout, unspecified: Secondary | ICD-10-CM | POA: Diagnosis not present

## 2015-08-03 DIAGNOSIS — M79641 Pain in right hand: Secondary | ICD-10-CM | POA: Diagnosis not present

## 2015-08-03 DIAGNOSIS — M25431 Effusion, right wrist: Secondary | ICD-10-CM | POA: Insufficient documentation

## 2015-08-03 DIAGNOSIS — Z8673 Personal history of transient ischemic attack (TIA), and cerebral infarction without residual deficits: Secondary | ICD-10-CM | POA: Diagnosis not present

## 2015-08-03 DIAGNOSIS — Z79899 Other long term (current) drug therapy: Secondary | ICD-10-CM | POA: Insufficient documentation

## 2015-08-03 DIAGNOSIS — E119 Type 2 diabetes mellitus without complications: Secondary | ICD-10-CM | POA: Diagnosis not present

## 2015-08-03 DIAGNOSIS — M10031 Idiopathic gout, right wrist: Secondary | ICD-10-CM | POA: Insufficient documentation

## 2015-08-03 DIAGNOSIS — I2510 Atherosclerotic heart disease of native coronary artery without angina pectoris: Secondary | ICD-10-CM | POA: Insufficient documentation

## 2015-08-03 DIAGNOSIS — E669 Obesity, unspecified: Secondary | ICD-10-CM | POA: Insufficient documentation

## 2015-08-03 DIAGNOSIS — Z7901 Long term (current) use of anticoagulants: Secondary | ICD-10-CM | POA: Insufficient documentation

## 2015-08-03 DIAGNOSIS — Z7984 Long term (current) use of oral hypoglycemic drugs: Secondary | ICD-10-CM | POA: Insufficient documentation

## 2015-08-03 DIAGNOSIS — E559 Vitamin D deficiency, unspecified: Secondary | ICD-10-CM | POA: Diagnosis not present

## 2015-08-03 DIAGNOSIS — I480 Paroxysmal atrial fibrillation: Secondary | ICD-10-CM | POA: Insufficient documentation

## 2015-08-03 DIAGNOSIS — Z951 Presence of aortocoronary bypass graft: Secondary | ICD-10-CM | POA: Diagnosis not present

## 2015-08-03 DIAGNOSIS — R2231 Localized swelling, mass and lump, right upper limb: Secondary | ICD-10-CM | POA: Diagnosis not present

## 2015-08-03 DIAGNOSIS — M7989 Other specified soft tissue disorders: Secondary | ICD-10-CM | POA: Diagnosis not present

## 2015-08-03 DIAGNOSIS — M25531 Pain in right wrist: Secondary | ICD-10-CM | POA: Diagnosis not present

## 2015-08-03 IMAGING — DX DG HAND COMPLETE 3+V*R*
3 series · 3 of 3 positions shown · non-contrast
Comparison: September 14, 2013

CLINICAL DATA: Pain, primarily posterior in location for 1 day

EXAM:
RIGHT HAND - COMPLETE 3+ VIEW

[x hand pa right]
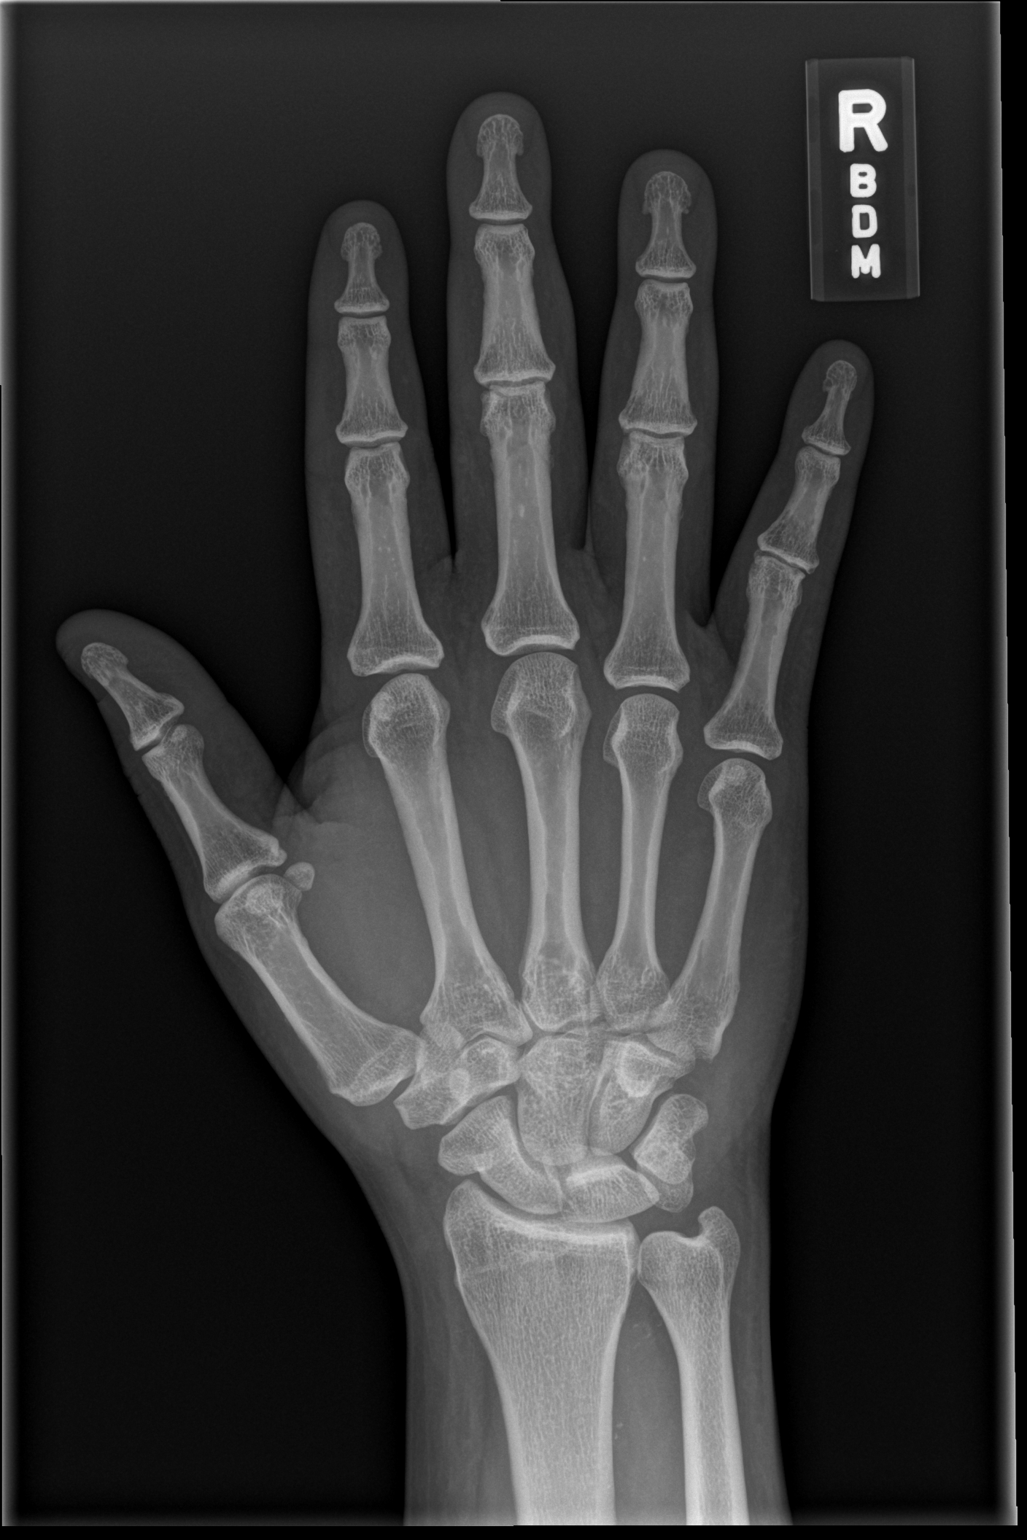

[x hand obl right]
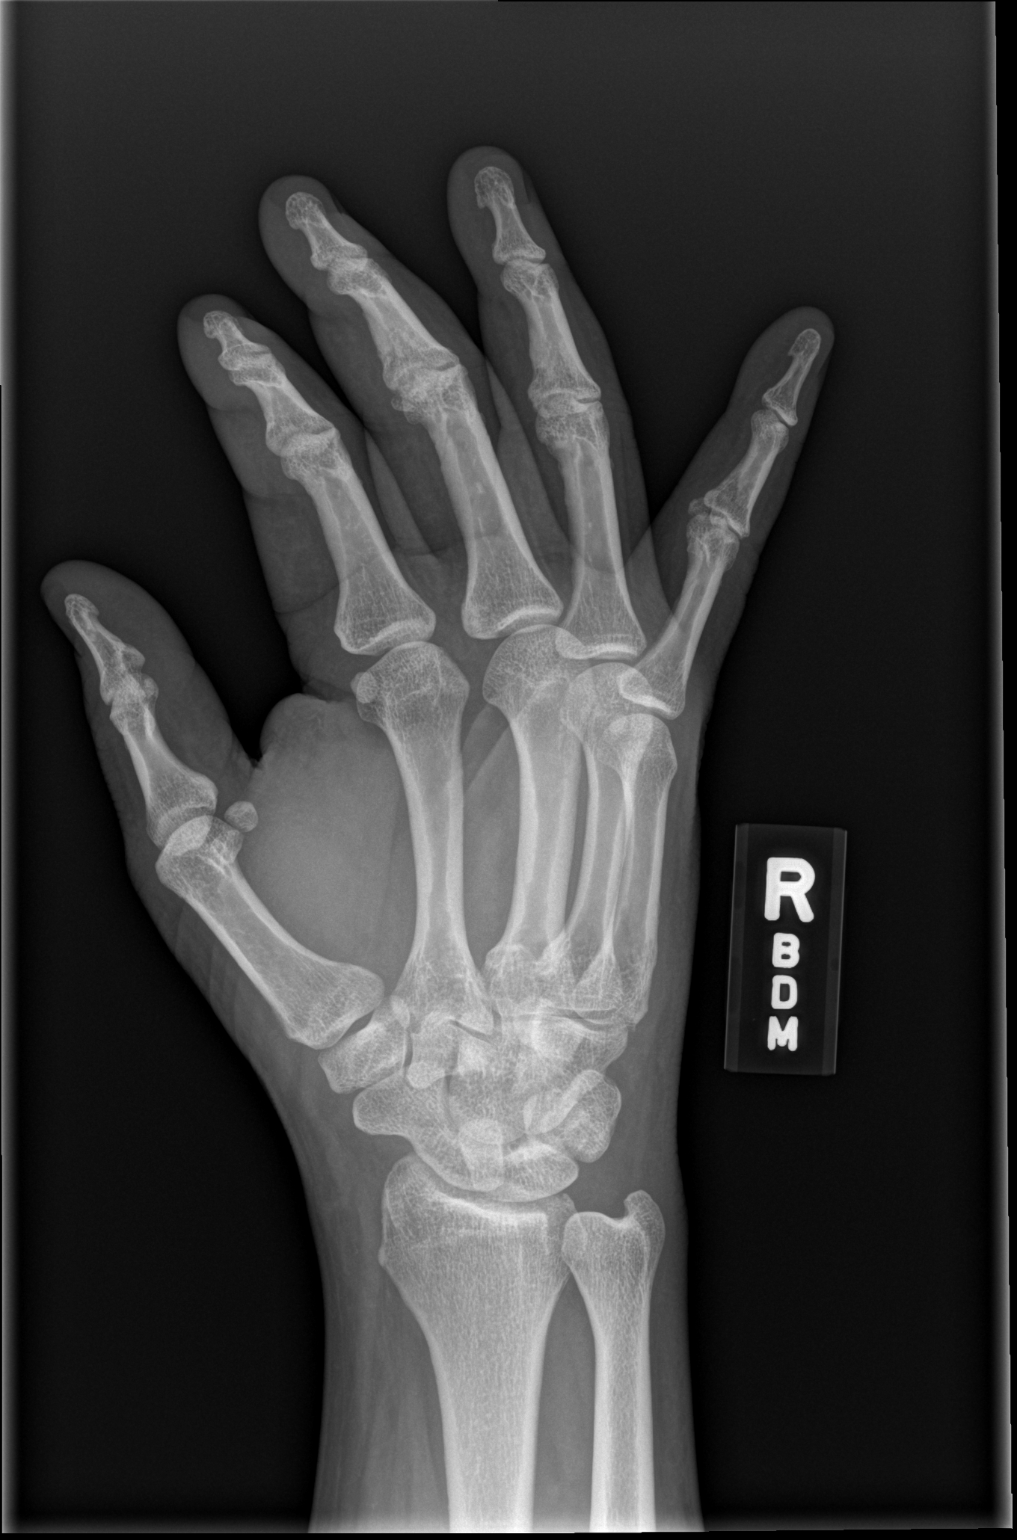

[x hand lat right]
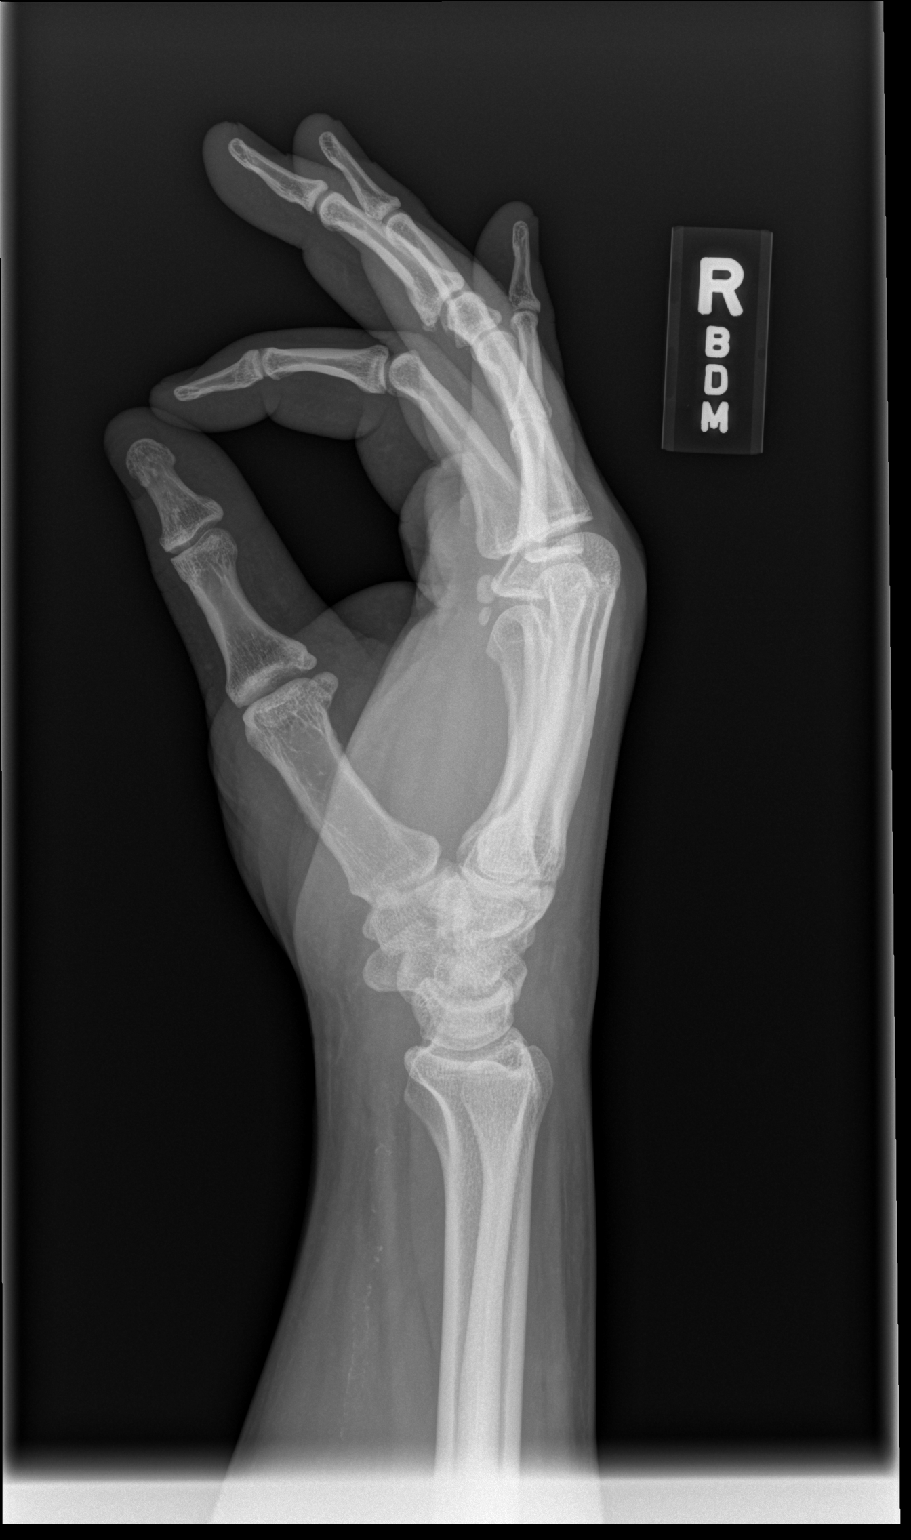

[3 of 3 positions shown; findings below may reference images not displayed]

FINDINGS: Frontal, oblique, and lateral views were obtained. There is mild
generalized soft tissue swelling. There is no appreciable fracture
or dislocation. There is mild narrowing of the fifth PIP joint.
Other joint spaces appear unremarkable. No erosive change. No
periostitis evident. There are foci of vascular calcification in the
volar, distal forearm region.
IMPRESSION: Mild soft tissue swelling. No fracture or dislocation. No apparent
arthropathy beyond mild narrowing of the fifth PIP joint which
appears unchanged.

## 2015-08-03 IMAGING — DX DG WRIST COMPLETE 3+V*R*
4 series · 4 of 4 positions shown · non-contrast
Comparison: RIGHT hand radiographs 09/14/2013

CLINICAL DATA: Pain and swelling, history hypertension, diabetes
mellitus

EXAM:
RIGHT WRIST - COMPLETE 3+ VIEW

[x wrist pa right]
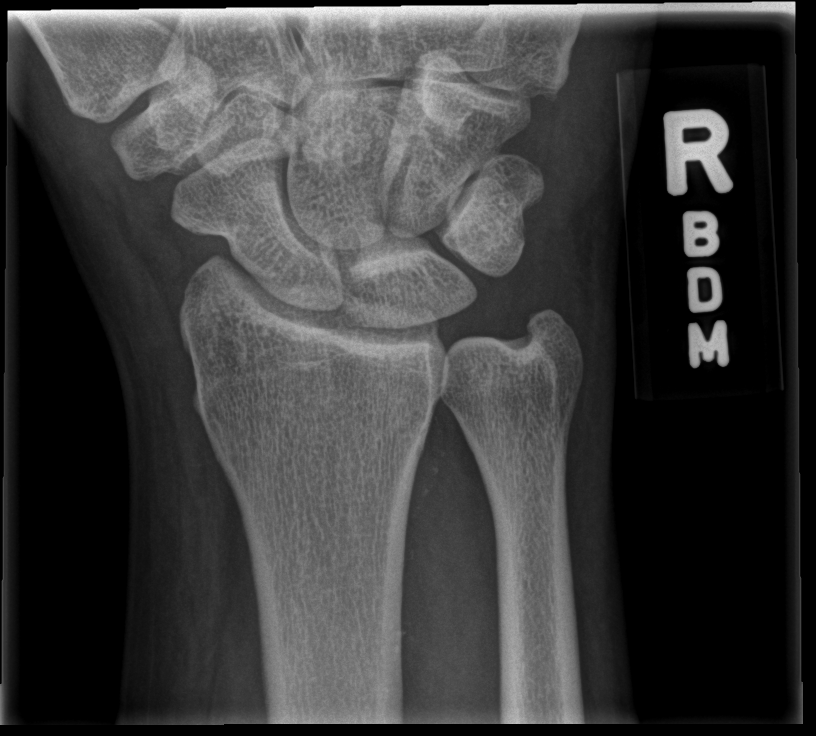

[x wrist obl right]
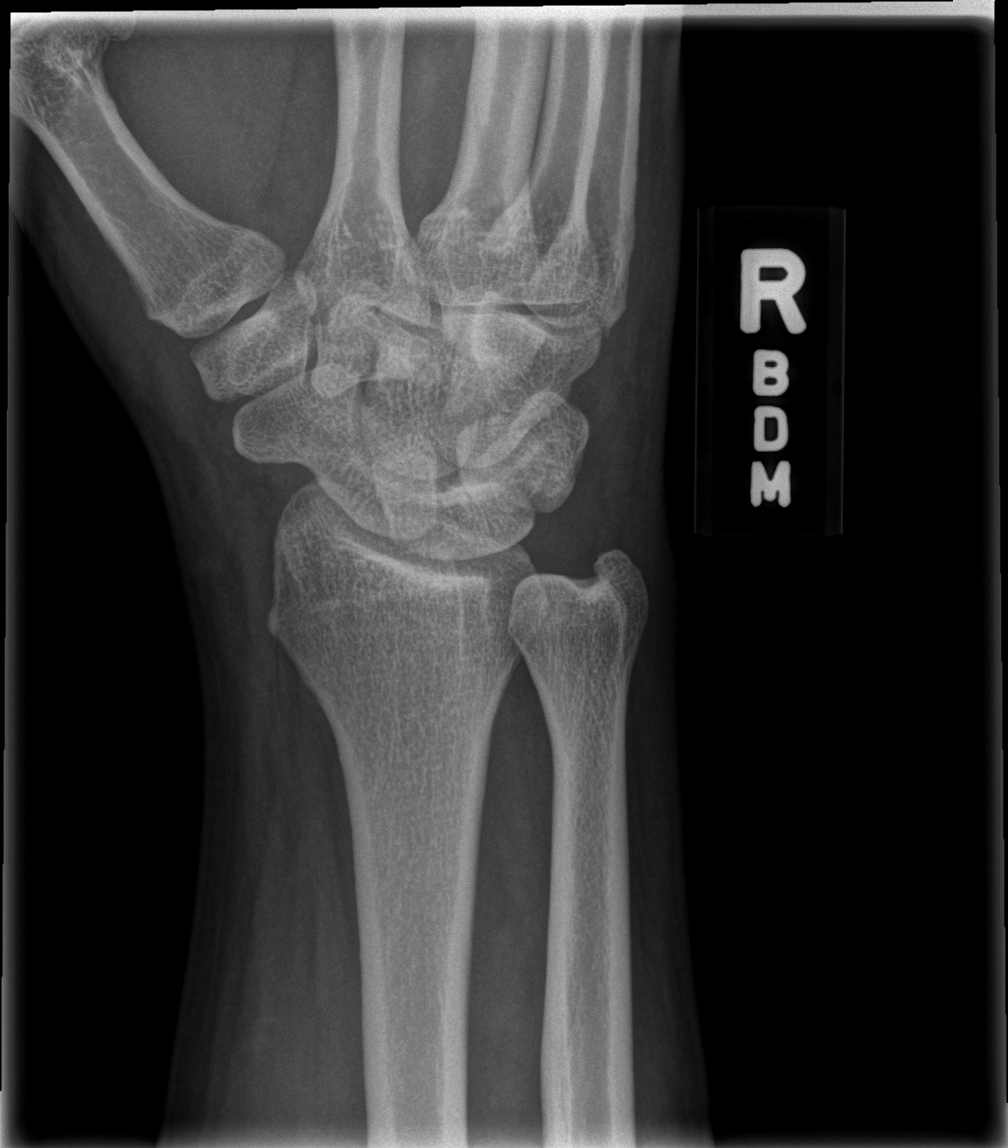

[x wrist lat right]
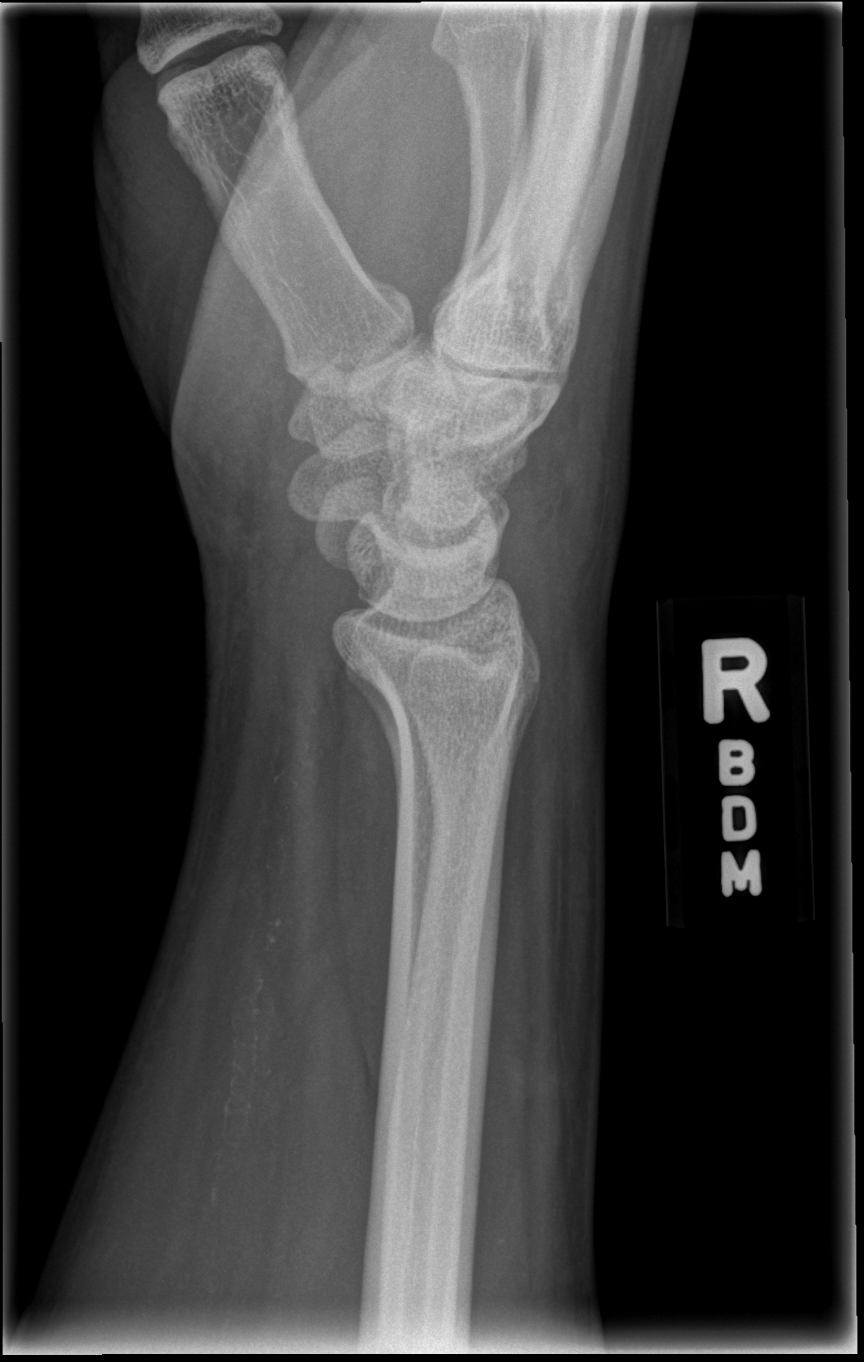

[x wrist navicular view right]
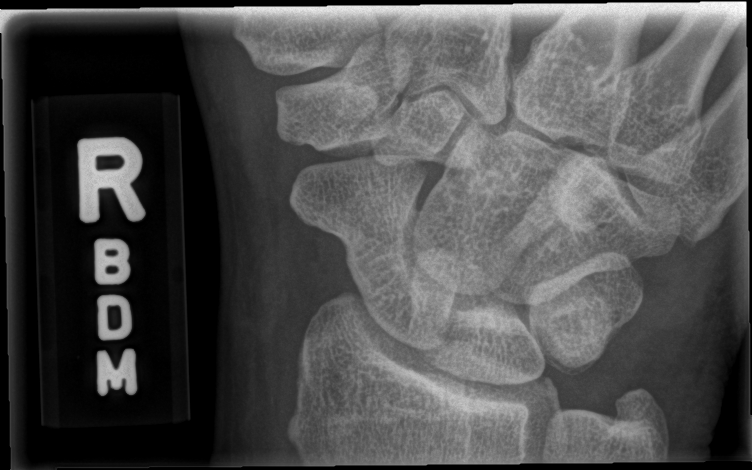

[4 of 4 positions shown; findings below may reference images not displayed]

FINDINGS: Osseous mineralization normal.

Joint spaces preserved.

No fracture, dislocation, or bone destruction.

Atherosclerotic calcifications distal forearm.
IMPRESSION: No acute osseous abnormalities.

## 2015-08-03 MED ORDER — KETOROLAC TROMETHAMINE 60 MG/2ML IM SOLN
60.0000 mg | Freq: Once | INTRAMUSCULAR | Status: AC
  Administered 2015-08-03: 60 mg via INTRAMUSCULAR
  Filled 2015-08-03: qty 2

## 2015-08-03 MED ORDER — OXYCODONE-ACETAMINOPHEN 5-325 MG PO TABS
1.0000 | ORAL_TABLET | Freq: Once | ORAL | Status: AC
  Administered 2015-08-03: 1 via ORAL
  Filled 2015-08-03: qty 1

## 2015-08-03 MED ORDER — METHYLPREDNISOLONE SODIUM SUCC 125 MG IJ SOLR
80.0000 mg | Freq: Once | INTRAMUSCULAR | Status: AC
  Administered 2015-08-03: 80 mg via INTRAMUSCULAR
  Filled 2015-08-03: qty 2

## 2015-08-03 NOTE — ED Notes (Signed)
Patient returned from xray.

## 2015-08-03 NOTE — ED Provider Notes (Signed)
CSN: [id number]     Arrival date & time 08/03/15  [id number] History   None    Chief Complaint  Patient presents with  . Joint Swelling     (Consider location/radiation/quality/duration/timing/severity/associated sxs/prior Treatment) HPI   [name] is a 50 y.o. male, with a history of DM, hypertension, TIA, aortic valve replacement, and CAD, presenting to the ED with right wrist pain and swelling that was present upon awakening today. Patient states that he had the swelling 2 weeks ago and at the same time had swelling in his left great toe. Both of these resolved spontaneously. Patient rates his pain a 10 out of 10, aching, radiating into the mid forearm. Patient has not taken anything for the pain. Pt denies neuro deficits, trauma, or any other pain or complaints.    Past Medical History  Diagnosis Date  . Diabetes mellitus   . Hypertension   . Unspecified vitamin D deficiency   . Hyperlipidemia   . H/O cardiovascular stress test     a. Myoview 2011: walked for 49min, images with EF 39%, prominent inf scar, mild reversibility. b. Cardiac CT 06/2010: no CAD, only mild plaque in prox RCA. c. EF normal by echo 2014.  [name] hematuria   . TRANSIENT ISCHEMIC ATTACK, HX OF     a. In 2004.  [name] NEPHROLITHIASIS, HX OF   . PAF (paroxysmal atrial fibrillation) ([place])     a. H/o such, with recurrence of coarse afib/flutter during ER consult 03/2012.  . Ascending aortic dissection ([place])     a. 2000 - with aortic valve involvement. S/p repair of aortic dissection with placement of mechanical AVR.  [name] Obesity   . H/O mechanical aortic valve replacement   . Cholelithiasis 08/22/2013  . CAD (coronary artery disease)    Past Surgical History  Procedure Laterality Date  . Appendectomy  1998  . Coronary artery bypass graft  2000  . Aortic valve replacement  2000  . Tee without cardioversion N/A 07/22/2013    Procedure: TRANSESOPHAGEAL ECHOCARDIOGRAM (TEE);  Surgeon: [name], MD;  Location:  [facility] ENDOSCOPY;  Service: Cardiovascular;  Laterality: N/A;  . Left and right heart catheterization with coronary angiogram N/A 07/19/2013    Procedure: LEFT AND RIGHT HEART CATHETERIZATION WITH CORONARY ANGIOGRAM;  Surgeon: [name], MD;  Location: [facility] CATH LAB;  Service: Cardiovascular;  Laterality: N/A;   Family History  Problem Relation Age of Onset  . Coronary artery disease Mother   . Hypertension Mother   . Diabetes Brother   . Coronary artery disease Other 44    male 1st degree relative, CABG in his 23's (father)   Social History  Substance Use Topics  . Smoking status: Never Smoker   . Smokeless tobacco: None  . Alcohol Use: No    Review of Systems  Constitutional: Negative for fever and chills.  Musculoskeletal: Positive for joint swelling and arthralgias (Right wrist).  Skin: Negative for color change and pallor.  Neurological: Negative for weakness and numbness.      Allergies  Insulin glargine  Home Medications   Prior to Admission medications   Medication Sig Start Date End Date Taking? Authorizing Provider  cholecalciferol (VITAMIN D) 1000 UNITS tablet Take 1,000 Units by mouth daily.    Historical Provider, MD  cloNIDine (CATAPRES) 0.1 MG tablet TAKE 1 TABLET TWICE DAILY (NEED MD APPOINTMENT) 07/28/15   Biagio Borg, MD  furosemide (LASIX) 40 MG tablet Take 1 tablet (40 mg total) by mouth  2 (two) times daily. 12/30/14   Erlene Quan, PA-C  glimepiride (AMARYL) 1 MG tablet take 3 tablet by mouth once daily WITH BREAKFAST 03/06/15   Biagio Borg, MD  levothyroxine (SYNTHROID, LEVOTHROID) 50 MCG tablet TAKE 1 TABLET DAILY. 07/10/15   Biagio Borg, MD  losartan (COZAAR) 100 MG tablet TAKE 1 TABLET DAILY 05/13/15   Biagio Borg, MD  metFORMIN (GLUCOPHAGE) 1000 MG tablet TAKE 1 TABLET TWICE DAILY WITH MEALS 05/13/15   Biagio Borg, MD  metoprolol (TOPROL-XL) 200 MG 24 hr tablet Take 1 tablet (200 mg total) by mouth daily. 12/30/14   Lelon Perla, MD   nitroGLYCERIN (NITROSTAT) 0.4 MG SL tablet Place 1 tablet (0.4 mg total) under the tongue every 5 (five) minutes x 3 doses as needed for chest pain. 07/25/13   Erlene Quan, PA-C  potassium chloride SA (K-DUR,KLOR-CON) 20 MEQ tablet Take 2 tablets (40 mEq total) by mouth daily. 12/30/14   Biagio Borg, MD  pravastatin (PRAVACHOL) 40 MG tablet TAKE 1 TABLET EVERY DAY 05/13/15   Biagio Borg, MD  warfarin (COUMADIN) 5 MG tablet Take as directed by coumadin clinic 05/14/15   Deboraha Sprang, MD   BP 124/81 mmHg  Pulse 74  Temp(Src) 98 F (36.7 C) (Oral)  Resp 16  SpO2 99% Physical Exam  Constitutional: He appears well-developed and well-nourished. No distress.  HENT:  Head: Normocephalic and atraumatic.  Eyes: Conjunctivae are normal.  Cardiovascular: Normal rate, regular rhythm and intact distal pulses.   Pulmonary/Chest: Effort normal.  Musculoskeletal:  Swelling and tenderness to the right hand and wrist. Circulation intact distal to the swelling. No increased warmth or erythema.  Neurological: He is alert.  No sensory deficits. Strength 5 out of 5. Coordination intact.  Skin: Skin is warm and dry. He is not diaphoretic.  Nursing note and vitals reviewed.   ED Course  Procedures (including critical care time) Labs Review Labs Reviewed - No data to display  Imaging Review Dg Wrist Complete Right  08/03/2015  CLINICAL DATA:  Pain and swelling, history hypertension, diabetes mellitus EXAM: RIGHT WRIST - COMPLETE 3+ VIEW COMPARISON:  RIGHT hand radiographs 09/14/2013 FINDINGS: Osseous mineralization normal. Joint spaces preserved. No fracture, dislocation, or bone destruction. Atherosclerotic calcifications distal forearm. IMPRESSION: No acute osseous abnormalities. Electronically Signed   By: Lavonia Dana M.D.   On: 08/03/2015 09:42   Dg Hand Complete Right  08/03/2015  CLINICAL DATA:  Pain, primarily posterior in location for 1 day EXAM: RIGHT HAND - COMPLETE 3+ VIEW COMPARISON:  September 14, 2013 FINDINGS: Frontal, oblique, and lateral views were obtained. There is mild generalized soft tissue swelling. There is no appreciable fracture or dislocation. There is mild narrowing of the fifth PIP joint. Other joint spaces appear unremarkable. No erosive change. No periostitis evident. There are foci of vascular calcification in the volar, distal forearm region. IMPRESSION: Mild soft tissue swelling. No fracture or dislocation. No apparent arthropathy beyond mild narrowing of the fifth PIP joint which appears unchanged. Electronically Signed   By: Lowella Grip III M.D.   On: 08/03/2015 09:44   I have personally reviewed and evaluated these images as part of my medical decision-making.   EKG Interpretation None      MDM   Final diagnoses:  Wrist swelling, right  Acute gout of right wrist, unspecified cause    [name] presents with right wrist pain and swelling since this morning.  Very low suspicion for  septic joint. Patient has presentation and risk factors more consistent with gout, especially with his recent history of toe swelling. Patient instructed to have close follow-up with his PCP on this matter. Return precautions discussed. Patient was treated with IM Solu-Medrol, which was chosen over NSAIDs due to the patient's Coumadin use. Patient was advised that the Solu-Medrol could increase his blood sugar. Patient voiced understanding of all instructions and is comfortable with discharge.  Filed Vitals:   08/03/15 0824 08/03/15 1034  BP: 140/88 124/81  Pulse: 73 74  Temp: 98 F (36.7 C)   TempSrc: Oral   Resp: 18 16  SpO2: 96% 99%     Lorayne Bender, PA-C 08/03/15 1528  Julianne Rice, MD 08/12/15 [phone]

## 2015-08-03 NOTE — ED Notes (Signed)
Pt c/o swelling and pain to right wrist noted when he woke up today. Pt denies injury. Pt reports 2 weeks ago he had swelling and pain to left great toe.

## 2015-08-03 NOTE — Discharge Instructions (Signed)
You have been seen today for wrist pain and swelling. Your imaging showed no abnormalities. It is suspected you may have gout in the wrist. The steroid shot you received in the ED should treat this effectively. It is important to note that the steroid may raise your blood sugar. Follow up with PCP as soon as possible for reevaluation. Return to ED should symptoms worsen.

## 2015-08-03 NOTE — ED Notes (Signed)
Declined W/C at D/C and was escorted to lobby by RN. 

## 2015-08-04 ENCOUNTER — Ambulatory Visit (INDEPENDENT_AMBULATORY_CARE_PROVIDER_SITE_OTHER): Payer: Medicare Other

## 2015-08-04 DIAGNOSIS — I480 Paroxysmal atrial fibrillation: Secondary | ICD-10-CM

## 2015-08-04 DIAGNOSIS — Z5181 Encounter for therapeutic drug level monitoring: Secondary | ICD-10-CM | POA: Diagnosis not present

## 2015-08-04 LAB — POCT INR: INR: 1.3

## 2015-08-06 ENCOUNTER — Ambulatory Visit: Payer: Medicare Other | Admitting: Internal Medicine

## 2015-08-14 ENCOUNTER — Ambulatory Visit (INDEPENDENT_AMBULATORY_CARE_PROVIDER_SITE_OTHER): Payer: Medicare Other | Admitting: *Deleted

## 2015-08-14 DIAGNOSIS — Z5181 Encounter for therapeutic drug level monitoring: Secondary | ICD-10-CM

## 2015-08-14 DIAGNOSIS — I480 Paroxysmal atrial fibrillation: Secondary | ICD-10-CM | POA: Diagnosis not present

## 2015-08-14 LAB — POCT INR: INR: 2

## 2015-08-28 ENCOUNTER — Ambulatory Visit (INDEPENDENT_AMBULATORY_CARE_PROVIDER_SITE_OTHER): Payer: Medicare Other | Admitting: *Deleted

## 2015-08-28 DIAGNOSIS — Z8679 Personal history of other diseases of the circulatory system: Secondary | ICD-10-CM | POA: Diagnosis not present

## 2015-08-28 DIAGNOSIS — G459 Transient cerebral ischemic attack, unspecified: Secondary | ICD-10-CM

## 2015-08-28 DIAGNOSIS — Z5181 Encounter for therapeutic drug level monitoring: Secondary | ICD-10-CM | POA: Diagnosis not present

## 2015-08-28 DIAGNOSIS — I480 Paroxysmal atrial fibrillation: Secondary | ICD-10-CM | POA: Diagnosis not present

## 2015-08-28 DIAGNOSIS — I4891 Unspecified atrial fibrillation: Secondary | ICD-10-CM | POA: Diagnosis not present

## 2015-08-28 DIAGNOSIS — I359 Nonrheumatic aortic valve disorder, unspecified: Secondary | ICD-10-CM | POA: Diagnosis not present

## 2015-08-28 LAB — POCT INR: INR: 1.4

## 2015-09-03 ENCOUNTER — Other Ambulatory Visit (INDEPENDENT_AMBULATORY_CARE_PROVIDER_SITE_OTHER): Payer: Medicare Other

## 2015-09-03 ENCOUNTER — Encounter: Payer: Self-pay | Admitting: Internal Medicine

## 2015-09-03 ENCOUNTER — Ambulatory Visit (INDEPENDENT_AMBULATORY_CARE_PROVIDER_SITE_OTHER): Payer: Medicare Other | Admitting: Internal Medicine

## 2015-09-03 VITALS — BP 118/72 | HR 60 | Temp 98.0°F | Ht 74.0 in | Wt 300.0 lb

## 2015-09-03 DIAGNOSIS — Z1211 Encounter for screening for malignant neoplasm of colon: Secondary | ICD-10-CM | POA: Diagnosis not present

## 2015-09-03 DIAGNOSIS — E119 Type 2 diabetes mellitus without complications: Secondary | ICD-10-CM

## 2015-09-03 DIAGNOSIS — I10 Essential (primary) hypertension: Secondary | ICD-10-CM

## 2015-09-03 DIAGNOSIS — N320 Bladder-neck obstruction: Secondary | ICD-10-CM

## 2015-09-03 DIAGNOSIS — E785 Hyperlipidemia, unspecified: Secondary | ICD-10-CM

## 2015-09-03 DIAGNOSIS — Z125 Encounter for screening for malignant neoplasm of prostate: Secondary | ICD-10-CM

## 2015-09-03 LAB — CBC WITH DIFFERENTIAL/PLATELET
BASOS ABS: 0 10*3/uL (ref 0.0–0.1)
Basophils Relative: 0.1 % (ref 0.0–3.0)
EOS PCT: 3.1 % (ref 0.0–5.0)
Eosinophils Absolute: 0.2 10*3/uL (ref 0.0–0.7)
HCT: 41.5 % (ref 39.0–52.0)
HEMOGLOBIN: 14 g/dL (ref 13.0–17.0)
LYMPHS ABS: 1.5 10*3/uL (ref 0.7–4.0)
Lymphocytes Relative: 25.3 % (ref 12.0–46.0)
MCHC: 33.7 g/dL (ref 30.0–36.0)
MCV: 83.3 fl (ref 78.0–100.0)
MONOS PCT: 10.8 % (ref 3.0–12.0)
Monocytes Absolute: 0.6 10*3/uL (ref 0.1–1.0)
NEUTROS PCT: 60.7 % (ref 43.0–77.0)
Neutro Abs: 3.5 10*3/uL (ref 1.4–7.7)
Platelets: 152 10*3/uL (ref 150.0–400.0)
RBC: 4.98 Mil/uL (ref 4.22–5.81)
RDW: 15.3 % (ref 11.5–15.5)
WBC: 5.8 10*3/uL (ref 4.0–10.5)

## 2015-09-03 LAB — HEPATIC FUNCTION PANEL
ALBUMIN: 4 g/dL (ref 3.5–5.2)
ALK PHOS: 69 U/L (ref 39–117)
ALT: 9 U/L (ref 0–53)
AST: 14 U/L (ref 0–37)
Bilirubin, Direct: 0.2 mg/dL (ref 0.0–0.3)
TOTAL PROTEIN: 7.5 g/dL (ref 6.0–8.3)
Total Bilirubin: 1.1 mg/dL (ref 0.2–1.2)

## 2015-09-03 LAB — BASIC METABOLIC PANEL
BUN: 16 mg/dL (ref 6–23)
CALCIUM: 9.4 mg/dL (ref 8.4–10.5)
CO2: 28 meq/L (ref 19–32)
CREATININE: 1.22 mg/dL (ref 0.40–1.50)
Chloride: 99 mEq/L (ref 96–112)
GFR: 80.68 mL/min (ref >60.00)
GLUCOSE: 324 mg/dL — AB (ref 70–99)
Potassium: 4.3 mEq/L (ref 3.5–5.1)
SODIUM: 135 meq/L (ref 135–145)

## 2015-09-03 LAB — PSA: PSA: 0.63 ng/mL (ref 0.10–4.00)

## 2015-09-03 LAB — LIPID PANEL
CHOLESTEROL: 157 mg/dL (ref 0–200)
HDL: 34.7 mg/dL — AB (ref >39.00)
NonHDL: 122.37
TRIGLYCERIDES: 248 mg/dL — AB (ref 0.0–149.0)
Total CHOL/HDL Ratio: 5
VLDL: 49.6 mg/dL — AB (ref 0.0–40.0)

## 2015-09-03 LAB — LDL CHOLESTEROL, DIRECT: LDL DIRECT: 60 mg/dL

## 2015-09-03 LAB — TSH: TSH: 3.11 u[IU]/mL (ref 0.35–4.50)

## 2015-09-03 NOTE — Progress Notes (Signed)
Subjective:    Patient ID: [name], male    DOB: April 25, 1966, 50 y.o.   MRN: [id number]  HPI  Here for yearly f/u;  Overall doing ok;  Pt denies Chest pain, worsening SOB, DOE, wheezing, orthopnea, PND, worsening LE edema, palpitations, dizziness or syncope.  Pt denies neurological change such as new headache, facial or extremity weakness.  Pt denies polydipsia, polyuria, or low sugar symptoms. Pt states overall good compliance with treatment and medications, good tolerability, and has been trying to follow appropriate diet.  Pt denies worsening depressive symptoms, suicidal ideation or panic. No fever, night sweats, wt loss, loss of appetite, or other constitutional symptoms.  Pt states good ability with ADL's, has low fall risk, home safety reviewed and adequate, no other significant changes in hearing or vision, and only occasionally active with exercise.  Has lost significant wt with more exercise , wants to get down closer to 240. Wt Readings from Last 3 Encounters:  09/03/15 300 lb (136.079 kg)  03/05/15 323 lb (146.512 kg)  10/06/14 312 lb 11.2 oz (141.84 kg)  Denies low sugar as he is very careful with diet around the exercise.   Lab Results  Component Value Date   HGBA1C 7.9* 03/05/2015   Past Medical History  Diagnosis Date  . Diabetes mellitus   . Hypertension   . Unspecified vitamin D deficiency   . Hyperlipidemia   . H/O cardiovascular stress test     a. Myoview 2011: walked for 60min, images with EF 39%, prominent inf scar, mild reversibility. b. Cardiac CT 06/2010: no CAD, only mild plaque in prox RCA. c. EF normal by echo 2014.  [name] hematuria   . TRANSIENT ISCHEMIC ATTACK, HX OF     a. In 2004.  [name] NEPHROLITHIASIS, HX OF   . PAF (paroxysmal atrial fibrillation) ([place])     a. H/o such, with recurrence of coarse afib/flutter during ER consult 03/2012.  . Ascending aortic dissection ([place])     a. 2000 - with aortic valve involvement. S/p repair of aortic dissection  with placement of mechanical AVR.  [name] Obesity   . H/O mechanical aortic valve replacement   . Cholelithiasis 08/22/2013  . CAD (coronary artery disease)    Past Surgical History  Procedure Laterality Date  . Appendectomy  1998  . Coronary artery bypass graft  2000  . Aortic valve replacement  2000  . Tee without cardioversion N/A 07/22/2013    Procedure: TRANSESOPHAGEAL ECHOCARDIOGRAM (TEE);  Surgeon: [name], MD;  Location: [facility] ENDOSCOPY;  Service: Cardiovascular;  Laterality: N/A;  . Left and right heart catheterization with coronary angiogram N/A 07/19/2013    Procedure: LEFT AND RIGHT HEART CATHETERIZATION WITH CORONARY ANGIOGRAM;  Surgeon: [name], MD;  Location: [facility] CATH LAB;  Service: Cardiovascular;  Laterality: N/A;    reports that he has never smoked. He does not have any smokeless tobacco history on file. He reports that he does not drink alcohol or use illicit drugs. family history includes Coronary artery disease in his mother; Coronary artery disease (age of onset: 41) in his other; Diabetes in his brother; Hypertension in his mother. Allergies  Allergen Reactions  . Insulin Glargine Swelling    edema   Current Outpatient Prescriptions on File Prior to Visit  Medication Sig Dispense Refill  . cholecalciferol (VITAMIN D) 1000 UNITS tablet Take 1,000 Units by mouth daily.    . cloNIDine (CATAPRES) 0.1 MG tablet TAKE 1 TABLET TWICE DAILY (NEED MD APPOINTMENT)  180 tablet 1  . furosemide (LASIX) 40 MG tablet Take 1 tablet (40 mg total) by mouth 2 (two) times daily. 180 tablet 6  . glimepiride (AMARYL) 1 MG tablet take 3 tablet by mouth once daily WITH BREAKFAST 270 tablet 3  . levothyroxine (SYNTHROID, LEVOTHROID) 50 MCG tablet TAKE 1 TABLET DAILY. 90 tablet 1  . losartan (COZAAR) 100 MG tablet TAKE 1 TABLET DAILY 90 tablet 1  . metFORMIN (GLUCOPHAGE) 1000 MG tablet TAKE 1 TABLET TWICE DAILY WITH MEALS 180 tablet 1  . metoprolol (TOPROL-XL) 200 MG 24 hr tablet Take  1 tablet (200 mg total) by mouth daily. 90 tablet 3  . nitroGLYCERIN (NITROSTAT) 0.4 MG SL tablet Place 1 tablet (0.4 mg total) under the tongue every 5 (five) minutes x 3 doses as needed for chest pain. 25 tablet 2  . potassium chloride SA (K-DUR,KLOR-CON) 20 MEQ tablet Take 2 tablets (40 mEq total) by mouth daily. 180 tablet 3  . pravastatin (PRAVACHOL) 40 MG tablet TAKE 1 TABLET EVERY DAY 90 tablet 1  . warfarin (COUMADIN) 5 MG tablet Take as directed by coumadin clinic 135 tablet 1   No current facility-administered medications on file prior to visit.   Review of Systems Constitutional: Negative for increased diaphoresis, or other activity, appetite or siginficant weight change other than noted HENT: Negative for worsening hearing loss, ear pain, facial swelling, mouth sores and neck stiffness.   Eyes: Negative for other worsening pain, redness or visual disturbance.  Respiratory: Negative for choking or stridor Cardiovascular: Negative for other chest pain and palpitations.  Gastrointestinal: Negative for worsening diarrhea, blood in stool, or abdominal distention Genitourinary: Negative for hematuria, flank pain or change in urine volume.  Musculoskeletal: Negative for myalgias or other joint complaints.  Skin: Negative for other color change and wound or drainage.  Neurological: Negative for syncope and numbness. other than noted Hematological: Negative for adenopathy. or other swelling Psychiatric/Behavioral: Negative for hallucinations, SI, self-injury, decreased concentration or other worsening agitation.      Objective:   Physical Exam BP 118/72 mmHg  Pulse 60  Temp(Src) 98 F (36.7 C) (Oral)  Ht 6\' 2"  (1.88 m)  Wt 300 lb (136.079 kg)  BMI 38.50 kg/m2  SpO2 97% VS noted,  Constitutional: Pt is oriented to person, place, and time. Appears well-developed and well-nourished, in no significant distress Head: Normocephalic and atraumatic  Eyes: Conjunctivae and EOM are  normal. Pupils are equal, round, and reactive to light Right Ear: External ear normal.  Left Ear: External ear normal Nose: Nose normal.  Mouth/Throat: Oropharynx is clear and moist  Neck: Normal range of motion. Neck supple. No JVD present. No tracheal deviation present or significant neck LA or mass Cardiovascular: Normal rate, regular rhythm, normal heart sounds and intact distal pulses.   Pulmonary/Chest: Effort normal and breath sounds without rales or wheezing  Abdominal: Soft. Bowel sounds are normal. NT. No HSM  Musculoskeletal: Normal range of motion. Exhibits no edema Lymphadenopathy: Has no cervical adenopathy.  Neurological: Pt is alert and oriented to person, place, and time. Pt has normal reflexes. No cranial nerve deficit. Motor grossly intact Skin: Skin is warm and dry. No rash noted or new ulcers Psychiatric:  Has normal mood and affect. Behavior is normal.     Assessment & Plan:

## 2015-09-03 NOTE — Progress Notes (Signed)
Pre visit review using our clinic review tool, if applicable. No additional management support is needed unless otherwise documented below in the visit note. 

## 2015-09-03 NOTE — Assessment & Plan Note (Signed)
stable overall by history and exam, recent data reviewed with pt, and pt to continue medical treatment as before,  to f/u any worsening symptoms or concerns Lab Results  Component Value Date   LDLCALC 75 02/21/2014    

## 2015-09-03 NOTE — Assessment & Plan Note (Signed)
stable overall by history and exam, recent data reviewed with pt, and pt to continue medical treatment as before,  to f/u any worsening symptoms or concerns BP Readings from Last 3 Encounters:  09/03/15 118/72  08/03/15 124/81  03/05/15 134/86

## 2015-09-03 NOTE — Assessment & Plan Note (Signed)
Will need referral GI due to being on coumadin

## 2015-09-03 NOTE — Patient Instructions (Addendum)
Please continue all other medications as before, and refills have been done if requested.  Please have the pharmacy call with any other refills you may need.  Please continue your efforts at being more active, low cholesterol diet, and weight control.  You are otherwise up to date with prevention measures today.  Please keep your appointments with your specialists as you may have planned  You will be contacted regarding the referral for: Gastroenterology  Please go to the LAB in the Basement (turn left off the elevator) for the tests to be done today  You will be contacted by phone if any changes need to be made immediately.  Otherwise, you will receive a letter about your results with an explanation, but please check with MyChart first.  Please remember to sign up for MyChart if you have not done so, as this will be important to you in the future with finding out test results, communicating by private email, and scheduling acute appointments online when needed.  Please return in 6 months, or sooner if needed 

## 2015-09-03 NOTE — Assessment & Plan Note (Signed)
stable overall by history and exam, recent data reviewed with pt, and pt to continue medical treatment as before,  to f/u any worsening symptoms or concerns Lab Results  Component Value Date   HGBA1C 7.9* 03/05/2015

## 2015-09-04 LAB — HEMOGLOBIN A1C
HEMOGLOBIN A1C: 13.1 % — AB (ref <5.7)
MEAN PLASMA GLUCOSE: 329 mg/dL

## 2015-09-07 ENCOUNTER — Ambulatory Visit (INDEPENDENT_AMBULATORY_CARE_PROVIDER_SITE_OTHER): Payer: Medicare Other | Admitting: *Deleted

## 2015-09-07 DIAGNOSIS — Z5181 Encounter for therapeutic drug level monitoring: Secondary | ICD-10-CM | POA: Diagnosis not present

## 2015-09-07 LAB — POCT INR: INR: 2.1

## 2015-09-09 ENCOUNTER — Encounter: Payer: Self-pay | Admitting: Gastroenterology

## 2015-09-09 ENCOUNTER — Encounter: Payer: Self-pay | Admitting: Internal Medicine

## 2015-09-09 ENCOUNTER — Other Ambulatory Visit: Payer: Self-pay | Admitting: Internal Medicine

## 2015-09-09 DIAGNOSIS — E119 Type 2 diabetes mellitus without complications: Secondary | ICD-10-CM

## 2015-09-09 DIAGNOSIS — Z794 Long term (current) use of insulin: Principal | ICD-10-CM

## 2015-09-21 ENCOUNTER — Ambulatory Visit (INDEPENDENT_AMBULATORY_CARE_PROVIDER_SITE_OTHER): Payer: Medicare Other | Admitting: *Deleted

## 2015-09-21 DIAGNOSIS — I480 Paroxysmal atrial fibrillation: Secondary | ICD-10-CM | POA: Diagnosis not present

## 2015-09-21 DIAGNOSIS — Z5181 Encounter for therapeutic drug level monitoring: Secondary | ICD-10-CM | POA: Diagnosis not present

## 2015-09-21 LAB — POCT INR: INR: 3.1

## 2015-10-05 ENCOUNTER — Ambulatory Visit (INDEPENDENT_AMBULATORY_CARE_PROVIDER_SITE_OTHER): Payer: Medicare Other | Admitting: *Deleted

## 2015-10-05 DIAGNOSIS — Z5181 Encounter for therapeutic drug level monitoring: Secondary | ICD-10-CM

## 2015-10-05 DIAGNOSIS — I480 Paroxysmal atrial fibrillation: Secondary | ICD-10-CM

## 2015-10-05 LAB — POCT INR: INR: 3.3

## 2015-10-07 ENCOUNTER — Other Ambulatory Visit: Payer: Self-pay | Admitting: Internal Medicine

## 2015-10-14 ENCOUNTER — Other Ambulatory Visit: Payer: Self-pay | Admitting: Internal Medicine

## 2015-10-15 ENCOUNTER — Ambulatory Visit: Payer: Medicare Other | Admitting: Endocrinology

## 2015-10-16 ENCOUNTER — Telehealth: Payer: Self-pay | Admitting: Endocrinology

## 2015-10-16 NOTE — Telephone Encounter (Signed)
Patient no showed today's appt. Please advise on how to follow up. °A. No follow up necessary. °B. Follow up urgent. Contact patient immediately. °C. Follow up necessary. Contact patient and schedule visit in ___ days. °D. Follow up advised. Contact patient and schedule visit in ____weeks. ° °

## 2015-10-16 NOTE — Telephone Encounter (Signed)
Please reschedule new pt appointment next available 

## 2015-10-28 ENCOUNTER — Ambulatory Visit (INDEPENDENT_AMBULATORY_CARE_PROVIDER_SITE_OTHER): Payer: Medicare HMO | Admitting: Pharmacist

## 2015-10-28 DIAGNOSIS — Z5181 Encounter for therapeutic drug level monitoring: Secondary | ICD-10-CM | POA: Diagnosis not present

## 2015-10-28 DIAGNOSIS — I480 Paroxysmal atrial fibrillation: Secondary | ICD-10-CM

## 2015-10-28 LAB — POCT INR: INR: 4.8

## 2015-11-06 ENCOUNTER — Ambulatory Visit (INDEPENDENT_AMBULATORY_CARE_PROVIDER_SITE_OTHER): Payer: Medicare HMO | Admitting: Endocrinology

## 2015-11-06 ENCOUNTER — Encounter: Payer: Self-pay | Admitting: Endocrinology

## 2015-11-06 ENCOUNTER — Ambulatory Visit: Payer: Medicare Other | Admitting: Gastroenterology

## 2015-11-06 VITALS — BP 118/82 | HR 64 | Temp 98.3°F | Ht 74.0 in | Wt 306.4 lb

## 2015-11-06 DIAGNOSIS — E119 Type 2 diabetes mellitus without complications: Secondary | ICD-10-CM | POA: Diagnosis not present

## 2015-11-06 LAB — POCT GLYCOSYLATED HEMOGLOBIN (HGB A1C): Hemoglobin A1C: 10.8

## 2015-11-06 MED ORDER — ACARBOSE 25 MG PO TABS: 25.0000 mg | ORAL_TABLET | Freq: Three times a day (TID) | ORAL | Status: DC

## 2015-11-06 MED ORDER — BROMOCRIPTINE MESYLATE 2.5 MG PO TABS: ORAL_TABLET | ORAL | Status: DC

## 2015-11-06 MED ORDER — GLIMEPIRIDE 4 MG PO TABS: 4.0000 mg | ORAL_TABLET | Freq: Every day | ORAL | Status: DC

## 2015-11-06 MED ORDER — METFORMIN HCL ER 500 MG PO TB24: 2000.0000 mg | ORAL_TABLET | Freq: Every day | ORAL | Status: DC

## 2015-11-06 NOTE — Patient Instructions (Addendum)
good diet and exercise significantly improve the control of your diabetes.  please let me know if you wish to be referred to a dietician.  high blood sugar is very risky to your health.  you should see an eye doctor and dentist every year.  It is very important to get all recommended vaccinations.  controlling your blood pressure and cholesterol drastically reduces the damage diabetes does to your body.  Those who smoke should quit.  please discuss these with your doctor.  check your blood sugar once a day.  vary the time of day when you check, between before the 3 meals, and at bedtime.  also check if you have symptoms of your blood sugar being too high or too low.  please keep a record of the readings and bring it to your next appointment here (or you can bring the meter itself).  You can write it on any piece of paper.  please call us sooner if your blood sugar goes below 70, or if you have a lot of readings over 200. i have sent a prescription to your pharmacy, to add 2 other diabetes meds, and to change the glimepiride to 4 mg daily Please come back for a follow-up appointment in 1 month.   we will probably have to add other meds in order to avoid the need for insulin.

## 2015-11-06 NOTE — Progress Notes (Signed)
Subjective:    Patient ID: [name], male    DOB: 1966/01/29, 50 y.o.   MRN: [id number]  HPI pt states DM was dx'ed in 2004; he has mild if any neuropathy of the lower extremities, but he has associated CAD, TIA, and PAD; he was started on insulin in 2008, after admission for nonketotic hyperosmolar hyperglycemic state, but he stopped in 2010, and declines to resume; pt says his diet and exercise are good; he has never had pancreatitis, severe hypoglycemia or DKA.     Past Medical History  Diagnosis Date  . Diabetes mellitus   . Hypertension   . Unspecified vitamin D deficiency   . Hyperlipidemia   . H/O cardiovascular stress test     a. Myoview 2011: walked for 93min, images with EF 39%, prominent inf scar, mild reversibility. b. Cardiac CT 06/2010: no CAD, only mild plaque in prox RCA. c. EF normal by echo 2014.  [name] hematuria   . TRANSIENT ISCHEMIC ATTACK, HX OF     a. In 2004.  [name] NEPHROLITHIASIS, HX OF   . PAF (paroxysmal atrial fibrillation) ([place])     a. H/o such, with recurrence of coarse afib/flutter during ER consult 03/2012.  . Ascending aortic dissection ([place])     a. 2000 - with aortic valve involvement. S/p repair of aortic dissection with placement of mechanical AVR.  [name] Obesity   . H/O mechanical aortic valve replacement   . Cholelithiasis 08/22/2013  . CAD (coronary artery disease)     Past Surgical History  Procedure Laterality Date  . Appendectomy  1998  . Coronary artery bypass graft  2000  . Aortic valve replacement  2000  . Tee without cardioversion N/A 07/22/2013    Procedure: TRANSESOPHAGEAL ECHOCARDIOGRAM (TEE);  Surgeon: [name], MD;  Location: [facility] ENDOSCOPY;  Service: Cardiovascular;  Laterality: N/A;  . Left and right heart catheterization with coronary angiogram N/A 07/19/2013    Procedure: LEFT AND RIGHT HEART CATHETERIZATION WITH CORONARY ANGIOGRAM;  Surgeon: [name], MD;  Location: [facility] CATH LAB;  Service: Cardiovascular;   Laterality: N/A;    Social History   Social History  . Marital Status: Married    Spouse Name: N/A  . Number of Children: 4  . Years of Education: N/A   Occupational History  . DISABLED    Social History Main Topics  . Smoking status: Never Smoker   . Smokeless tobacco: Not on file  . Alcohol Use: No  . Drug Use: No  . Sexual Activity: Not on file   Other Topics Concern  . Not on file   Social History Narrative    Current Outpatient Prescriptions on File Prior to Visit  Medication Sig Dispense Refill  . cholecalciferol (VITAMIN D) 1000 UNITS tablet Take 1,000 Units by mouth daily.    . cloNIDine (CATAPRES) 0.1 MG tablet TAKE 1 TABLET TWICE DAILY (NEED MD APPOINTMENT) 180 tablet 1  . furosemide (LASIX) 40 MG tablet Take 1 tablet (40 mg total) by mouth 2 (two) times daily. 180 tablet 6  . levothyroxine (SYNTHROID, LEVOTHROID) 50 MCG tablet TAKE 1 TABLET DAILY. 90 tablet 1  . losartan (COZAAR) 100 MG tablet TAKE 1 TABLET EVERY DAY 90 tablet 1  . metoprolol (TOPROL-XL) 200 MG 24 hr tablet Take 1 tablet (200 mg total) by mouth daily. 90 tablet 3  . nitroGLYCERIN (NITROSTAT) 0.4 MG SL tablet Place 1 tablet (0.4 mg total) under the tongue every 5 (five) minutes x 3 doses  as needed for chest pain. 25 tablet 2  . potassium chloride SA (K-DUR,KLOR-CON) 20 MEQ tablet TAKE 2 TABLETS EVERY DAY 180 tablet 3  . pravastatin (PRAVACHOL) 40 MG tablet TAKE 1 TABLET EVERY DAY 90 tablet 1  . warfarin (COUMADIN) 5 MG tablet TAKE AS DIRECTED BY COUMADIN CLINIC 135 tablet 1   No current facility-administered medications on file prior to visit.    Allergies  Allergen Reactions  . Insulin Glargine Swelling    edema    Family History  Problem Relation Age of Onset  . Coronary artery disease Mother   . Hypertension Mother   . Diabetes Brother   . Coronary artery disease Other 29    male 1st degree relative, CABG in his 55's (father)    BP 118/82 mmHg  Pulse 64  Temp(Src) 98.3 F (36.8  C) (Oral)  Ht 6\' 2"  (1.88 m)  Wt 306 lb 6 oz (138.971 kg)  BMI 39.32 kg/m2  SpO2 98%  Review of Systems denies blurry vision, headache, chest pain, sob, n/v, urinary frequency, muscle cramps, excessive diaphoresis, memory loss, cold intolerance, rhinorrhea, and easy bruising.  He has gained weight.      Objective:   Physical Exam VS: see vs page GEN: no distress.  Morbid obesity HEAD: head: no deformity eyes: no periorbital swelling, no proptosis external nose and ears are normal mouth: no lesion seen NECK: supple, thyroid is not enlarged CHEST WALL: no deformity.  Old healed surgical scars are noted. LUNGS: clear to auscultation CV: reg rate and rhythm, no murmur, but valve click is heard. ABD: abdomen is soft, nontender.  no hepatosplenomegaly.  not distended.  no hernia MUSCULOSKELETAL: muscle bulk and strength are grossly normal.  no obvious joint swelling.  gait is normal and steady EXTEMITIES: no deformity.  no ulcer on the feet.  feet are of normal color and temp.  Trace bilat leg edema PULSES: dorsalis pedis intact bilat.  no carotid bruit NEURO:  cn 2-12 grossly intact.   readily moves all 4's.  sensation is intact to touch on the feet SKIN:  Normal texture and temperature.  No rash or suspicious lesion is visible.   NODES:  None palpable at the neck PSYCH: alert, well-oriented.  Does not appear anxious nor depressed.  A1c=10.8% Lab Results  Component Value Date   CREATININE 1.22 09/03/2015   BUN 16 09/03/2015   NA 135 09/03/2015   K 4.3 09/03/2015   CL 99 09/03/2015   CO2 28 09/03/2015   i personally reviewed electrocardiogram tracing (06/05/14): Indication: dizziness Impression: LVH  I have reviewed outside records, and summarized: Pt was recently ween for wellness visit.  He was feeling well, but glycemic control was poor.   CT (2015): Pancreas: Unremarkable.    Assessment & Plan:  Type 2 DM: severe exacerbation.  He declines to resume insulin.  i advised  him of risks.  He wants to take generics first, even though I told his this will not get him to goal.   Edema, new to me: this limits oral DM rx options.   Patient is advised the following: Patient Instructions  good diet and exercise significantly improve the control of your diabetes.  please let me know if you wish to be referred to a dietician.  high blood sugar is very risky to your health.  you should see an eye doctor and dentist every year.  It is very important to get all recommended vaccinations.  controlling your blood pressure and cholesterol  drastically reduces the damage diabetes does to your body.  Those who smoke should quit.  please discuss these with your doctor.  check your blood sugar once a day.  vary the time of day when you check, between before the 3 meals, and at bedtime.  also check if you have symptoms of your blood sugar being too high or too low.  please keep a record of the readings and bring it to your next appointment here (or you can bring the meter itself).  You can write it on any piece of paper.  please call us sooner if your blood sugar goes below 70, or if you have a lot of readings over 200. i have sent a prescription to your pharmacy, to add 2 other diabetes meds, and to change the glimepiride to 4 mg daily Please come back for a follow-up appointment in 1 month.   we will probably have to add other meds in order to avoid the need for insulin.     Renato Shin, MD

## 2015-11-06 NOTE — Progress Notes (Signed)
Pre visit review using our clinic review tool, if applicable. No additional management support is needed unless otherwise documented below in the visit note. 

## 2015-11-11 ENCOUNTER — Ambulatory Visit (INDEPENDENT_AMBULATORY_CARE_PROVIDER_SITE_OTHER): Payer: Medicare HMO | Admitting: *Deleted

## 2015-11-11 DIAGNOSIS — Z5181 Encounter for therapeutic drug level monitoring: Secondary | ICD-10-CM

## 2015-11-11 DIAGNOSIS — I480 Paroxysmal atrial fibrillation: Secondary | ICD-10-CM

## 2015-11-11 LAB — POCT INR: INR: 2.4

## 2015-11-12 ENCOUNTER — Encounter: Payer: Self-pay | Admitting: Gastroenterology

## 2015-11-12 ENCOUNTER — Ambulatory Visit (INDEPENDENT_AMBULATORY_CARE_PROVIDER_SITE_OTHER): Payer: Medicare HMO | Admitting: Gastroenterology

## 2015-11-12 VITALS — BP 130/88 | HR 68 | Ht 74.0 in | Wt 306.0 lb

## 2015-11-12 DIAGNOSIS — I25709 Atherosclerosis of coronary artery bypass graft(s), unspecified, with unspecified angina pectoris: Secondary | ICD-10-CM

## 2015-11-12 DIAGNOSIS — I350 Nonrheumatic aortic (valve) stenosis: Secondary | ICD-10-CM | POA: Diagnosis not present

## 2015-11-12 DIAGNOSIS — Z1211 Encounter for screening for malignant neoplasm of colon: Secondary | ICD-10-CM | POA: Diagnosis not present

## 2015-11-12 DIAGNOSIS — I480 Paroxysmal atrial fibrillation: Secondary | ICD-10-CM

## 2015-11-12 NOTE — Patient Instructions (Addendum)
We have sent your demographic and insurance information to [employer]. They should contact you within the next week regarding your Cologuard (colon cancer screening) test. If you have not heard from them within the next week, please call our office at [phone].  If you are age 51 or older, your body mass index should be between 23-30. Your Body mass index is 39.27 kg/(m^2). If this is out of the aforementioned range listed, please consider follow up with your Primary Care Provider.  If you are age 49 or younger, your body mass index should be between 19-25. Your Body mass index is 39.27 kg/(m^2). If this is out of the aformentioned range listed, please consider follow up with your Primary Care Provider.   Thank you for choosing [facility]  Dr Wilfrid Lund III

## 2015-11-12 NOTE — Progress Notes (Signed)
[name]:  History: [name] 11/12/2015  Referring physician: Cathlean Cower, MD  Reason for consult/chief complaint: Colon Cancer Screening   Subjective HPI:  Referred for screening.  Feels well, no change bowel habits, abdominal pain or rectal bleeding  ROS:  Review of Systems Denies chest pain or dyspnea  Past Medical History: Past Medical History  Diagnosis Date  . Diabetes mellitus   . Hypertension   . Unspecified vitamin D deficiency   . Hyperlipidemia   . H/O cardiovascular stress test     a. Myoview 2011: walked for 98min, images with EF 39%, prominent inf scar, mild reversibility. b. Cardiac CT 06/2010: no CAD, only mild plaque in prox RCA. c. EF normal by echo 2014.  [name] hematuria   . TRANSIENT ISCHEMIC ATTACK, HX OF     a. In 2004.  [name] NEPHROLITHIASIS, HX OF   . PAF (paroxysmal atrial fibrillation) ([name])     a. H/o such, with recurrence of coarse afib/flutter during ER consult 03/2012.  . Ascending aortic dissection ([name])     a. 2000 - with aortic valve involvement. S/p repair of aortic dissection with placement of mechanical AVR.  [name] Obesity   . H/O mechanical aortic valve replacement   . Cholelithiasis 08/22/2013  . CAD (coronary artery disease)    I reviewed PCP [name] this week.  Patient previously on insulin, but stopped it and does not wish to be on it any more.  Hgb A1C elevated   Past Surgical History: Past Surgical History  Procedure Laterality Date  . Appendectomy  1998  . Coronary artery bypass graft  2000  . Aortic valve replacement  2000  . Tee without cardioversion N/A 07/22/2013    Procedure: TRANSESOPHAGEAL ECHOCARDIOGRAM (TEE);  Surgeon: Zachary Hector, MD;  [name];  Service: Cardiovascular;  Laterality: N/A;  . Left and right heart catheterization with coronary angiogram N/A 07/19/2013    Procedure: LEFT AND RIGHT HEART CATHETERIZATION WITH CORONARY ANGIOGRAM;  Surgeon: [facility] Booze, MD;   [facility]: [name];  Service: Cardiovascular;  Laterality: N/A;     Family History: Family History  Problem Relation Age of Onset  . Coronary artery disease Mother   . Hypertension Mother   . Diabetes Brother   . Coronary artery disease Other 97    male 1st degree relative, CABG in his 36's (father)    Social History: Social History   Social History  . Marital Status: Married    Spouse Name: N/A  . Number of Children: 4  . Years of Education: N/A   Occupational History  . DISABLED    Social History Main Topics  . Smoking status: Never Smoker   . Smokeless tobacco: Never Used  . Alcohol Use: No  . Drug Use: No  . Sexual Activity: Not Asked   Other Topics Concern  . None   Social History Narrative    Allergies: Allergies  Allergen Reactions  . Insulin Glargine Swelling    edema    Outpatient Meds: Current Outpatient Prescriptions  Medication Sig Dispense Refill  . acarbose (PRECOSE) 25 MG tablet Take 1 tablet (25 mg total) by mouth 3 (three) times daily with meals. 270 tablet 3  . bromocriptine (PARLODEL) 2.5 MG tablet 1/4 tab daily 22 tablet 11  . cholecalciferol (VITAMIN D) 1000 UNITS tablet Take 1,000 Units by mouth daily.    . cloNIDine (CATAPRES) 0.1 MG tablet TAKE 1 TABLET TWICE DAILY (NEED MD APPOINTMENT) 180 tablet 1  .  furosemide (LASIX) 40 MG tablet Take 1 tablet (40 mg total) by mouth 2 (two) times daily. 180 tablet 6  . glimepiride (AMARYL) 4 MG tablet Take 1 tablet (4 mg total) by mouth daily before breakfast. 90 tablet 3  . levothyroxine (SYNTHROID, LEVOTHROID) 50 MCG tablet TAKE 1 TABLET DAILY. 90 tablet 1  . losartan (COZAAR) 100 MG tablet TAKE 1 TABLET EVERY DAY 90 tablet 1  . metFORMIN (GLUCOPHAGE-XR) 500 MG 24 hr tablet Take 4 tablets (2,000 mg total) by mouth daily. 360 tablet 3  . metoprolol (TOPROL-XL) 200 MG 24 hr tablet Take 1 tablet (200 mg total) by mouth daily. 90 tablet 3  . nitroGLYCERIN (NITROSTAT) 0.4 MG SL tablet Place 1  tablet (0.4 mg total) under the tongue every 5 (five) minutes x 3 doses as needed for chest pain. 25 tablet 2  . potassium chloride SA (K-DUR,KLOR-CON) 20 MEQ tablet TAKE 2 TABLETS EVERY DAY 180 tablet 3  . pravastatin (PRAVACHOL) 40 MG tablet TAKE 1 TABLET EVERY DAY 90 tablet 1  . warfarin (COUMADIN) 5 MG tablet TAKE AS DIRECTED BY COUMADIN CLINIC 135 tablet 1   No current facility-administered medications for this visit.      ___________________________________________________________________ Objective  Exam:  BP 130/88 mmHg  Pulse 68  Ht 6\' 2"  (1.88 m)  Wt 306 lb (138.801 kg)  BMI 39.27 kg/m2   General: this is a(n) well-appearing middle aged man   Eyes: sclera anicteric, no redness  ENT: oral mucosa moist without lesions, no cervical or supraclavicular lymphadenopathy, good dentition  CV: RRR with soft systolic murmur,valve click, no JVD, no peripheral edema  Resp: clear to auscultation bilaterally, normal RR and effort noted  GI: soft, no tenderness, with active bowel sounds. No guarding or palpable organomegaly noted.  Skin; warm and dry, no rash or jaundice noted  Neuro: awake, alert and oriented x 3. Normal gross motor function and fluent speech Rectal: NST, no palpable lesions Labs:  CBC    Component Value Date/Time   WBC 5.8 09/03/2015 1007   RBC 4.98 09/03/2015 1007   HGB 14.0 09/03/2015 1007   HCT 41.5 09/03/2015 1007   PLT 152.0 09/03/2015 1007   MCV 83.3 09/03/2015 1007   MCH 28.8 06/05/2014 1022   MCHC 33.7 09/03/2015 1007   RDW 15.3 09/03/2015 1007   LYMPHSABS 1.5 09/03/2015 1007   MONOABS 0.6 09/03/2015 1007   EOSABS 0.2 09/03/2015 1007   BASOSABS 0.0 09/03/2015 1007     Hgb A1C 13 on 09/03/15, then 10.8 on 11/06/15   Assessment: Encounter Diagnoses  Name Primary?  . Screen for colon cancer Yes  . PAF (paroxysmal atrial fibrillation) ([place])   . Coronary artery disease involving coronary bypass graft of native heart with unspecified  angina pectoris   . Moderate to severe aortic stenosis- pt will need AVR revision     He is high risk for a colonoscopy due to poorly-controlled DM, need for bridging lovenox, reported aortic stenosis   Plan:  Cologuard test.  If positive, will need to proceed with colonoscopy. We discussed benefits and limits of this plan and how I think it is the best balance of risk/benefit while screening him for CRC.  Thank you for the courtesy of this consult.  Please call me with any questions or concerns.  Nelida Meuse III  CC: Cathlean Cower, MD

## 2015-11-16 ENCOUNTER — Other Ambulatory Visit: Payer: Self-pay | Admitting: *Deleted

## 2015-11-16 MED ORDER — POTASSIUM CHLORIDE CRYS ER 20 MEQ PO TBCR: 40.0000 meq | EXTENDED_RELEASE_TABLET | Freq: Every day | ORAL | Status: DC

## 2015-11-16 NOTE — Telephone Encounter (Signed)
Pt goes to coumadin clinic on [address] forwarding over to them...[name]

## 2015-11-18 ENCOUNTER — Other Ambulatory Visit: Payer: Self-pay | Admitting: *Deleted

## 2015-11-27 ENCOUNTER — Ambulatory Visit (INDEPENDENT_AMBULATORY_CARE_PROVIDER_SITE_OTHER): Payer: Medicare HMO | Admitting: *Deleted

## 2015-11-27 DIAGNOSIS — Z5181 Encounter for therapeutic drug level monitoring: Secondary | ICD-10-CM | POA: Diagnosis not present

## 2015-11-27 DIAGNOSIS — I480 Paroxysmal atrial fibrillation: Secondary | ICD-10-CM

## 2015-11-27 LAB — POCT INR: INR: 2.3

## 2015-12-08 ENCOUNTER — Ambulatory Visit (INDEPENDENT_AMBULATORY_CARE_PROVIDER_SITE_OTHER): Payer: Medicare HMO | Admitting: Endocrinology

## 2015-12-08 ENCOUNTER — Encounter: Payer: Self-pay | Admitting: Endocrinology

## 2015-12-08 VITALS — BP 138/84 | HR 67 | Temp 98.1°F | Ht 74.0 in | Wt 305.4 lb

## 2015-12-08 DIAGNOSIS — E119 Type 2 diabetes mellitus without complications: Secondary | ICD-10-CM | POA: Diagnosis not present

## 2015-12-08 MED ORDER — FUROSEMIDE 40 MG PO TABS: 40.0000 mg | ORAL_TABLET | Freq: Two times a day (BID) | ORAL | Status: DC

## 2015-12-08 MED ORDER — METOPROLOL SUCCINATE ER 200 MG PO TB24: 200.0000 mg | ORAL_TABLET | Freq: Every day | ORAL | Status: DC

## 2015-12-08 MED ORDER — LEVOTHYROXINE SODIUM 50 MCG PO TABS: 50.0000 ug | ORAL_TABLET | Freq: Every day | ORAL | Status: DC

## 2015-12-08 MED ORDER — PRAVASTATIN SODIUM 40 MG PO TABS
40.0000 mg | ORAL_TABLET | Freq: Every day | ORAL | Status: DC
Start: 2015-12-08 — End: 2016-10-31

## 2015-12-08 NOTE — Patient Instructions (Addendum)
check your blood sugar once a day.  vary the time of day when you check, between before the 3 meals, and at bedtime.  also check if you have symptoms of your blood sugar being too high or too low.  please keep a record of the readings and bring it to your next appointment here (or you can bring the meter itself).  You can write it on any piece of paper.  please call us sooner if your blood sugar goes below 70, or if you have a lot of readings over 200. Please come back for a follow-up appointment in 6 weeks  blood tests are requested for you today.  We'll let you know about the results.  If it is high, we can add a medication such as [place] or invokana, or both, in order to avoid the need for insulin.

## 2015-12-08 NOTE — Progress Notes (Signed)
Subjective:    Patient ID: [name], male    DOB: 12/15/1965, 50 y.o.   MRN: [id number]  HPI Pt returns for f/u of diabetes mellitus: DM type: 2 Dx'ed: 123XX123 Complications: CAD, TIA, and PAD Therapy: 4 oral meds.   DKA: never Severe hypoglycemia: never Pancreatitis: never Other: edema limits oral rx options; he took insulin 2008-2010, and does not wish to resume; he wants to take generics first, even though I told his this will not get him to goal.  Interval history: no cbg record, but states cbg's are in the low-100's.  He denies hypoglycemia.  pt states he feels well in general. Past Medical History  Diagnosis Date  . Diabetes mellitus   . Hypertension   . Unspecified vitamin D deficiency   . Hyperlipidemia   . H/O cardiovascular stress test     a. Myoview 2011: walked for 41min, images with EF 39%, prominent inf scar, mild reversibility. b. Cardiac CT 06/2010: no CAD, only mild plaque in prox RCA. c. EF normal by echo 2014.  [name] hematuria   . TRANSIENT ISCHEMIC ATTACK, HX OF     a. In 2004.  [name] NEPHROLITHIASIS, HX OF   . PAF (paroxysmal atrial fibrillation) ([place])     a. H/o such, with recurrence of coarse afib/flutter during ER consult 03/2012.  . Ascending aortic dissection ([place])     a. 2000 - with aortic valve involvement. S/p repair of aortic dissection with placement of mechanical AVR.  [name] Obesity   . H/O mechanical aortic valve replacement   . Cholelithiasis 08/22/2013  . CAD (coronary artery disease)     Past Surgical History  Procedure Laterality Date  . Appendectomy  1998  . Coronary artery bypass graft  2000  . Aortic valve replacement  2000  . Tee without cardioversion N/A 07/22/2013    Procedure: TRANSESOPHAGEAL ECHOCARDIOGRAM (TEE);  Surgeon: [name], MD;  Location: [facility] ENDOSCOPY;  Service: Cardiovascular;  Laterality: N/A;  . Left and right heart catheterization with coronary angiogram N/A 07/19/2013    Procedure: LEFT AND RIGHT HEART  CATHETERIZATION WITH CORONARY ANGIOGRAM;  Surgeon: [name], MD;  Location: [facility] CATH LAB;  Service: Cardiovascular;  Laterality: N/A;    Social History   Social History  . Marital Status: Married    Spouse Name: N/A  . Number of Children: 4  . Years of Education: N/A   Occupational History  . DISABLED    Social History Main Topics  . Smoking status: Never Smoker   . Smokeless tobacco: Never Used  . Alcohol Use: No  . Drug Use: No  . Sexual Activity: Not on file   Other Topics Concern  . Not on file   Social History Narrative    Current Outpatient Prescriptions on File Prior to Visit  Medication Sig Dispense Refill  . acarbose (PRECOSE) 25 MG tablet Take 1 tablet (25 mg total) by mouth 3 (three) times daily with meals. 270 tablet 3  . bromocriptine (PARLODEL) 2.5 MG tablet 1/4 tab daily 22 tablet 11  . cholecalciferol (VITAMIN D) 1000 UNITS tablet Take 1,000 Units by mouth daily.    . cloNIDine (CATAPRES) 0.1 MG tablet TAKE 1 TABLET TWICE DAILY (NEED MD APPOINTMENT) 180 tablet 1  . glimepiride (AMARYL) 4 MG tablet Take 1 tablet (4 mg total) by mouth daily before breakfast. 90 tablet 3  . losartan (COZAAR) 100 MG tablet TAKE 1 TABLET EVERY DAY 90 tablet 1  . metFORMIN (GLUCOPHAGE-XR) 500  MG 24 hr tablet Take 4 tablets (2,000 mg total) by mouth daily. 360 tablet 3  . nitroGLYCERIN (NITROSTAT) 0.4 MG SL tablet Place 1 tablet (0.4 mg total) under the tongue every 5 (five) minutes x 3 doses as needed for chest pain. 25 tablet 2  . potassium chloride SA (K-DUR,KLOR-CON) 20 MEQ tablet Take 2 tablets (40 mEq total) by mouth daily. 180 tablet 2  . warfarin (COUMADIN) 5 MG tablet TAKE AS DIRECTED BY COUMADIN CLINIC 135 tablet 1   No current facility-administered medications on file prior to visit.    Allergies  Allergen Reactions  . Insulin Glargine Swelling    edema    Family History  Problem Relation Age of Onset  . Coronary artery disease Mother   . Hypertension  Mother   . Diabetes Brother   . Coronary artery disease Other 7    male 1st degree relative, CABG in his 60's (father)    BP 138/84 mmHg  Pulse 67  Temp(Src) 98.1 F (36.7 C) (Oral)  Ht 6\' 2"  (1.88 m)  Wt 305 lb 6.4 oz (138.529 kg)  BMI 39.19 kg/m2  SpO2 96%  Review of Systems He denies hypoglycemia.  He has gained 1 lb since last ov.    Objective:   Physical Exam VITAL SIGNS:  See vs page GENERAL: no distress Pulses: dorsalis pedis intact bilat.   MSK: no deformity of the feet CV: no leg edema Skin:  no ulcer on the feet.  normal color and temp on the feet. Neuro: sensation is intact to touch on the feet      Assessment & Plan:  Type 2 DM: he prob needs increased rx.  check fructosamine. Obesity: slightly worse.  i advised bariatric surgery. and I gave pt printed info.    Patient is advised the following: Patient Instructions  check your blood sugar once a day.  vary the time of day when you check, between before the 3 meals, and at bedtime.  also check if you have symptoms of your blood sugar being too high or too low.  please keep a record of the readings and bring it to your next appointment here (or you can bring the meter itself).  You can write it on any piece of paper.  please call us sooner if your blood sugar goes below 70, or if you have a lot of readings over 200. Please come back for a follow-up appointment in 6 weeks  blood tests are requested for you today.  We'll let you know about the results.  If it is high, we can add a medication such as [place] or invokana, or both, in order to avoid the need for insulin.   Renato Shin, MD

## 2015-12-09 ENCOUNTER — Other Ambulatory Visit: Payer: Self-pay | Admitting: Endocrinology

## 2015-12-09 LAB — FRUCTOSAMINE: Fructosamine: 397 umol/L — ABNORMAL HIGH (ref 0–285)

## 2015-12-09 MED ORDER — SITAGLIPTIN PHOSPHATE 100 MG PO TABS: 100.0000 mg | ORAL_TABLET | Freq: Every day | ORAL | Status: DC

## 2015-12-18 ENCOUNTER — Ambulatory Visit (INDEPENDENT_AMBULATORY_CARE_PROVIDER_SITE_OTHER): Payer: Medicare HMO | Admitting: Pharmacist

## 2015-12-18 DIAGNOSIS — Z5181 Encounter for therapeutic drug level monitoring: Secondary | ICD-10-CM

## 2015-12-18 DIAGNOSIS — I480 Paroxysmal atrial fibrillation: Secondary | ICD-10-CM | POA: Diagnosis not present

## 2015-12-18 LAB — POCT INR: INR: 1.7

## 2015-12-22 NOTE — Progress Notes (Deleted)
Subjective:   [name] is a 50 y.o. male who presents for Medicare Annual/Subsequent preventive examination.  Review of Systems:   HRA assessment completed during this visit with   The Patient was informed that the wellness visit is to identify future health risk and educate and initiate measures that can reduce risk for increased disease through the lifespan.    NO ROS; Medicare Wellness Visit Last OV:   Labs completed:   Medical hx DM Dr. Loanne Drilling MI 2004  S/p repair of aortic dissection with AVR  Never smoked Obesity  Psychosocial:  Medications reviewed for issues; compliance; otc meds  BMI:   Diet;  Heart healthy? Fried foods? Sodium: Cholesterol Nutritional counseling given:  Teeth or Denture issues?   Exercise;  active vs sedentary What is the hardest physical activity you have done recently and for how long?   HOME SAFETY reviewed for short term and long term;  Issues reviewed that may potentiate risk include multilevel or inaccessible homes or long term plan?  Personal safety issues reviewed for risk such as safe community; smoke Secondary school teacher; firearms safety if applicable; protection when in the sun; driving safety for seniors or any recent accidents. Fall hx;  Fear of falling?  UA or BOWEL incontinence;  Functional losses from last year to this year?  Given education on "Fall Prevention in the Home" for more safety tips the patient can apply as appropriate.   Risk for Depression reviewed: Any emotional problems? Anxious, depressed, irritable, sad or blue?  Denies feeling depressed or hopeless; voices pleasure in daily life How many social activities have you been engaged in within the last 2 weeks? Who would help you with chores; illness; shopping other? Sleep   Cognitive;  Manages checkbook, medications; no failures of task Ad8 score reviewed for issues;  Issues making decisions; no  Less interest in hobbies / activities" no  Repeats  questions, stories; family complaining: NO  Trouble using ordinary gadgets; microwave; computer: no  Forgets the month or year: no  Mismanaging finances: no  Missing apt: no but does write them down  Daily problems with thinking of memory NO Ad8 score is 0   Advanced Directive addressed; Completed or educated   Counseling Health Maintenance Gaps: HIV Colonoscopy; 01/2015 EKG: 05/2014 Prostate cancer screening:    Hearing:  Right ear Left ear Difficulty hearing a whisper Tinnitus; [employer] Tinn[employer];   Ophthalmology exam/ DUE  Glaucoma screening Diabetic retinopathy if appropriate   Immunizations Due: (Vaccines reviewed and educated regarding any overdue)  PSV 23 - 10/17/2016 this is his second PSV vaccine. Prevnar due 65 or earlier per MD PSV 23; 3 per lifetime   Established and updated Risk reviewed and appropriate referral made or health recommendations:  Current Care Team reviewed and updated       Objective:    Vitals: There were no vitals taken for this visit.  There is no height or weight on file to calculate BMI.  Tobacco History  Smoking Status  . Never Smoker  Smokeless Tobacco  . Never Used     Counseling given: Not Answered   Past Medical History:  Diagnosis Date  . Ascending aortic dissection ([place])    a. 2000 - with aortic valve involvement. S/p repair of aortic dissection with placement of mechanical AVR.  [name] CAD (coronary artery disease)   . Cholelithiasis 08/22/2013  . Diabetes mellitus   . Gross hematuria   . H/O cardiovascular stress test    a. Myoview 2011:  walked for 20min, images with EF 39%, prominent inf scar, mild reversibility. b. Cardiac CT 06/2010: no CAD, only mild plaque in prox RCA. c. EF normal by echo 2014.  . H/O mechanical aortic valve replacement   . Hyperlipidemia   . Hypertension   . NEPHROLITHIASIS, HX OF   . Obesity   . PAF (paroxysmal atrial fibrillation) ([place])    a. H/o such, with recurrence of  coarse afib/flutter during ER consult 03/2012.  [name] TRANSIENT ISCHEMIC ATTACK, HX OF    a. In 2004.  [name] Unspecified vitamin D deficiency    Past Surgical History:  Procedure Laterality Date  . AORTIC VALVE REPLACEMENT  2000  . APPENDECTOMY  1998  . CORONARY ARTERY BYPASS GRAFT  2000  . LEFT AND RIGHT HEART CATHETERIZATION WITH CORONARY ANGIOGRAM N/A 07/19/2013   Procedure: LEFT AND RIGHT HEART CATHETERIZATION WITH CORONARY ANGIOGRAM;  Surgeon: [name], MD;  Location: [facility] CATH LAB;  Service: Cardiovascular;  Laterality: N/A;  . TEE WITHOUT CARDIOVERSION N/A 07/22/2013   Procedure: TRANSESOPHAGEAL ECHOCARDIOGRAM (TEE);  Surgeon: [name], MD;  Location[facility] ENDOSCOPY;  Service: Cardiovascular;  Laterality: N/A;   Family History  Problem Relation Age of Onset  . Coronary artery disease Mother   . Hypertension Mother   . Diabetes Brother   . Coronary artery disease Other 36    male 1st degree relative, CABG in his 67's (father)   History  Sexual Activity  . Sexual activity: Not on file    Outpatient Encounter Prescriptions as of 12/23/2015  Medication Sig  . acarbose (PRECOSE) 25 MG tablet Take 1 tablet (25 mg total) by mouth 3 (three) times daily with meals.  . bromocriptine (PARLODEL) 2.5 MG tablet 1/4 tab daily  . cholecalciferol (VITAMIN D) 1000 UNITS tablet Take 1,000 Units by mouth daily.  . cloNIDine (CATAPRES) 0.1 MG tablet TAKE 1 TABLET TWICE DAILY (NEED MD APPOINTMENT)  . furosemide (LASIX) 40 MG tablet Take 1 tablet (40 mg total) by mouth 2 (two) times daily.  [name] glimepiride (AMARYL) 4 MG tablet Take 1 tablet (4 mg total) by mouth daily before breakfast.  . levothyroxine (SYNTHROID, LEVOTHROID) 50 MCG tablet Take 1 tablet (50 mcg total) by mouth daily.  [name] losartan (COZAAR) 100 MG tablet TAKE 1 TABLET EVERY DAY  . metFORMIN (GLUCOPHAGE-XR) 500 MG 24 hr tablet Take 4 tablets (2,000 mg total) by mouth daily.  . metoprolol (TOPROL-XL) 200 MG 24 hr tablet Take 1 tablet  (200 mg total) by mouth daily.  . nitroGLYCERIN (NITROSTAT) 0.4 MG SL tablet Place 1 tablet (0.4 mg total) under the tongue every 5 (five) minutes x 3 doses as needed for chest pain.  . potassium chloride SA (K-DUR,KLOR-CON) 20 MEQ tablet Take 2 tablets (40 mEq total) by mouth daily.  . pravastatin (PRAVACHOL) 40 MG tablet Take 1 tablet (40 mg total) by mouth daily.  . sitaGLIPtin (JANUVIA) 100 MG tablet Take 1 tablet (100 mg total) by mouth daily.  [name] warfarin (COUMADIN) 5 MG tablet TAKE AS DIRECTED BY COUMADIN CLINIC   No facility-administered encounter medications on file as of 12/23/2015.     Activities of Daily Living No flowsheet data found.  Patient Care Team: Biagio Borg, MD as PCP - General   Assessment:    Exercise Activities and Dietary recommendations    Goals    None     Fall Risk Fall Risk  09/03/2015 08/26/2014 08/22/2013 05/09/2013  Falls in the past year? No No No No  Depression Screen PHQ 2/9 Scores 09/03/2015 08/26/2014 08/22/2013 05/09/2013  PHQ - 2 Score 0 0 0 0    Cognitive Testing No flowsheet data found.  Immunization History  Administered Date(s) Administered  . Influenza Whole 02/20/2007, 04/12/2010  . Influenza,inj,Quad PF,36+ Mos 05/09/2013, 02/21/2014, 03/05/2015  . Pneumococcal Polysaccharide-23 08/07/2008, 10/18/2011  . Td 01/13/2003, 08/22/2013   Screening Tests Health Maintenance  Topic Date Due  . OPHTHALMOLOGY EXAM  02/11/1975  . HIV Screening  02/11/1980  . PNEUMOCOCCAL POLYSACCHARIDE VACCINE (2) 08/07/2013  . COLONOSCOPY  02/11/2015  . INFLUENZA VACCINE  12/22/2015  . HEMOGLOBIN A1C  05/07/2016  . FOOT EXAM  12/07/2016  . TETANUS/TDAP  08/23/2023      Plan:    During the course of the visit the patient was educated and counseled about the following appropriate screening and preventive services:   Vaccines to include Pneumoccal, Influenza, Hepatitis B, Td, Zostavax, HCV  Electrocardiogram/ 05/2014  Cardiovascular Disease/  followed by cardiology   Colorectal cancer screening/ due now  Diabetes screening/ followed by Dr. Loanne Drilling  Prostate Cancer Screening  Glaucoma screening/ needs eye exam  Nutrition counseling obese  Smoking cessation counseling/never smoked   Patient Instructions (the written plan) was given to the patient.    Wynetta Fines, RN  12/22/2015

## 2015-12-23 ENCOUNTER — Ambulatory Visit: Payer: Medicare HMO

## 2016-01-08 ENCOUNTER — Ambulatory Visit (INDEPENDENT_AMBULATORY_CARE_PROVIDER_SITE_OTHER): Payer: Medicare HMO | Admitting: Pharmacist

## 2016-01-08 DIAGNOSIS — Z8679 Personal history of other diseases of the circulatory system: Secondary | ICD-10-CM | POA: Diagnosis not present

## 2016-01-08 DIAGNOSIS — G459 Transient cerebral ischemic attack, unspecified: Secondary | ICD-10-CM | POA: Diagnosis not present

## 2016-01-08 DIAGNOSIS — I4891 Unspecified atrial fibrillation: Secondary | ICD-10-CM | POA: Diagnosis not present

## 2016-01-08 DIAGNOSIS — I359 Nonrheumatic aortic valve disorder, unspecified: Secondary | ICD-10-CM | POA: Diagnosis not present

## 2016-01-08 DIAGNOSIS — Z5181 Encounter for therapeutic drug level monitoring: Secondary | ICD-10-CM

## 2016-01-08 LAB — POCT INR: INR: 2

## 2016-01-13 ENCOUNTER — Other Ambulatory Visit: Payer: Self-pay

## 2016-01-13 LAB — COLOGUARD: COLOGUARD: NEGATIVE

## 2016-01-19 ENCOUNTER — Encounter: Payer: Self-pay | Admitting: Endocrinology

## 2016-01-19 ENCOUNTER — Ambulatory Visit (INDEPENDENT_AMBULATORY_CARE_PROVIDER_SITE_OTHER): Payer: Medicare HMO | Admitting: Endocrinology

## 2016-01-19 VITALS — BP 134/86 | HR 67 | Ht 74.0 in | Wt 310.0 lb

## 2016-01-19 DIAGNOSIS — E119 Type 2 diabetes mellitus without complications: Secondary | ICD-10-CM | POA: Diagnosis not present

## 2016-01-19 LAB — POCT GLYCOSYLATED HEMOGLOBIN (HGB A1C): Hemoglobin A1C: 8.3

## 2016-01-19 MED ORDER — CANAGLIFLOZIN 100 MG PO TABS: 100.0000 mg | ORAL_TABLET | Freq: Every day | ORAL | 11 refills | Status: DC

## 2016-01-19 MED ORDER — FUROSEMIDE 40 MG PO TABS: 40.0000 mg | ORAL_TABLET | Freq: Every day | ORAL | 3 refills | Status: DC

## 2016-01-19 NOTE — Patient Instructions (Addendum)
check your blood sugar once a day.  vary the time of day when you check, between before the 3 meals, and at bedtime.  also check if you have symptoms of your blood sugar being too high or too low.  please keep a record of the readings and bring it to your next appointment here (or you can bring the meter itself).  You can write it on any piece of paper.  please call us sooner if your blood sugar goes below 70, or if you have a lot of readings over 200. Please come back for a follow-up appointment in 2 months. I have sent a prescription to your pharmacy, to add "invokana".  As this is a fluid pill also, please reduce the furosemide to 40 mg daily.   Please consider having weight loss surgery.  It is good for your health.  Here is some information about it.  If you decide to consider further, please call the phone number in the papers, and register for a free informational meeting.

## 2016-01-19 NOTE — Progress Notes (Signed)
Subjective:    Patient ID: [name], male    DOB: 1966-04-30, 50 y.o.   MRN: [name]  HPI Pt returns for f/u of diabetes mellitus: DM type: 2 Dx'ed: 123XX123 Complications: CAD, TIA, and PAD.   Therapy: 5 oral meds.   DKA: never Severe hypoglycemia: never.  Pancreatitis: never Other: edema limits oral rx options; he took insulin 2008-2010, and does not wish to resume; he wants to take generics first, even though I told him this will not get him to goal.  Interval history: no cbg record, but states cbg's are still in the low-100's.  pt states he feels well in general.   Past Medical History:  Diagnosis Date  . Ascending aortic dissection ([place])    a. 2000 - with aortic valve involvement. S/p repair of aortic dissection with placement of mechanical AVR.  [name] CAD (coronary artery disease)   . Cholelithiasis 08/22/2013  . Diabetes mellitus   . Gross hematuria   . H/O cardiovascular stress test    a. Myoview 2011: walked for 52min, images with EF 39%, prominent inf scar, mild reversibility. b. Cardiac CT 06/2010: no CAD, only mild plaque in prox RCA. c. EF normal by echo 2014.  . H/O mechanical aortic valve replacement   . Hyperlipidemia   . Hypertension   . NEPHROLITHIASIS, HX OF   . Obesity   . PAF (paroxysmal atrial fibrillation) ([place])    a. H/o such, with recurrence of coarse afib/flutter during ER consult 03/2012.  [name] TRANSIENT ISCHEMIC ATTACK, HX OF    a. In 2004.  [name] Unspecified vitamin D deficiency     Past Surgical History:  Procedure Laterality Date  . AORTIC VALVE REPLACEMENT  2000  . APPENDECTOMY  1998  . CORONARY ARTERY BYPASS GRAFT  2000  . LEFT AND RIGHT HEART CATHETERIZATION WITH CORONARY ANGIOGRAM N/A 07/19/2013   Procedure: LEFT AND RIGHT HEART CATHETERIZATION WITH CORONARY ANGIOGRAM;  Surgeon: [name], MD;  Location: [facility] CATH LAB;  Service: Cardiovascular;  Laterality: N/A;  . TEE WITHOUT CARDIOVERSION N/A 07/22/2013   Procedure: TRANSESOPHAGEAL  ECHOCARDIOGRAM (TEE);  Surgeon: [name], MD;  Location: [facility] ENDOSCOPY;  Service: Cardiovascular;  Laterality: N/A;    Social History   Social History  . Marital status: Married    Spouse name: N/A  . Number of children: 4  . Years of education: N/A   Occupational History  . DISABLED Unemployed   Social History Main Topics  . Smoking status: Never Smoker  . Smokeless tobacco: Never Used  . Alcohol use No  . Drug use: No  . Sexual activity: Not on file   Other Topics Concern  . Not on file   Social History Narrative  . No narrative on file    Current Outpatient Prescriptions on File Prior to Visit  Medication Sig Dispense Refill  . acarbose (PRECOSE) 25 MG tablet Take 1 tablet (25 mg total) by mouth 3 (three) times daily with meals. 270 tablet 3  . bromocriptine (PARLODEL) 2.5 MG tablet 1/4 tab daily 22 tablet 11  . cholecalciferol (VITAMIN D) 1000 UNITS tablet Take 1,000 Units by mouth daily.    . cloNIDine (CATAPRES) 0.1 MG tablet TAKE 1 TABLET TWICE DAILY (NEED MD APPOINTMENT) 180 tablet 1  . glimepiride (AMARYL) 4 MG tablet Take 1 tablet (4 mg total) by mouth daily before breakfast. 90 tablet 3  . levothyroxine (SYNTHROID, LEVOTHROID) 50 MCG tablet Take 1 tablet (50 mcg total) by mouth daily. 90 tablet  3  . losartan (COZAAR) 100 MG tablet TAKE 1 TABLET EVERY DAY 90 tablet 1  . metFORMIN (GLUCOPHAGE-XR) 500 MG 24 hr tablet Take 4 tablets (2,000 mg total) by mouth daily. 360 tablet 3  . metoprolol (TOPROL-XL) 200 MG 24 hr tablet Take 1 tablet (200 mg total) by mouth daily. 90 tablet 3  . nitroGLYCERIN (NITROSTAT) 0.4 MG SL tablet Place 1 tablet (0.4 mg total) under the tongue every 5 (five) minutes x 3 doses as needed for chest pain. 25 tablet 2  . potassium chloride SA (K-DUR,KLOR-CON) 20 MEQ tablet Take 2 tablets (40 mEq total) by mouth daily. 180 tablet 2  . pravastatin (PRAVACHOL) 40 MG tablet Take 1 tablet (40 mg total) by mouth daily. 90 tablet 3  . sitaGLIPtin  (JANUVIA) 100 MG tablet Take 1 tablet (100 mg total) by mouth daily. 30 tablet 11  . warfarin (COUMADIN) 5 MG tablet TAKE AS DIRECTED BY COUMADIN CLINIC 135 tablet 1   No current facility-administered medications on file prior to visit.     Allergies  Allergen Reactions  . Insulin Glargine Swelling    edema    Family History  Problem Relation Age of Onset  . Coronary artery disease Mother   . Hypertension Mother   . Diabetes Brother   . Coronary artery disease Other 56    male 1st degree relative, CABG in his 4's (father)    BP 134/86   Pulse 67   Ht 6\' 2"  (1.88 m)   Wt (!) 310 lb (140.6 kg)   BMI 39.80 kg/m   Review of Systems He denies hypoglycemia.    Objective:   Physical Exam VITAL SIGNS:  See vs page GENERAL: no distress Pulses: dorsalis pedis intact bilat.   MSK: no deformity of the feet CV: trace bilat leg edema Skin:  no ulcer on the feet.  normal color and temp on the feet. Neuro: sensation is intact to touch on the feet.    A1c=8.3% Lab Results  Component Value Date   TSH 3.11 09/03/2015   Lab Results  Component Value Date   CREATININE 1.22 09/03/2015   BUN 16 09/03/2015   NA 135 09/03/2015   K 4.3 09/03/2015   CL 99 09/03/2015   CO2 28 09/03/2015      Assessment & Plan:  Type 2 DM: worse.   Edema: as invokana will cause a diuretic effect, he should reduce lasix.   Obesity: persistent.

## 2016-01-20 ENCOUNTER — Telehealth: Payer: Self-pay | Admitting: Gastroenterology

## 2016-01-20 NOTE — Telephone Encounter (Signed)
His cologuard test is negative. So we will NOT proceed with colonoscopy. Please be sure results go to his PCP, as the cologuard should be done again in three years.   The pt has been notified of the above recommendations.

## 2016-02-05 ENCOUNTER — Ambulatory Visit (INDEPENDENT_AMBULATORY_CARE_PROVIDER_SITE_OTHER): Payer: Medicare HMO | Admitting: Pharmacist

## 2016-02-05 DIAGNOSIS — Z8679 Personal history of other diseases of the circulatory system: Secondary | ICD-10-CM

## 2016-02-05 DIAGNOSIS — Z5181 Encounter for therapeutic drug level monitoring: Secondary | ICD-10-CM

## 2016-02-05 DIAGNOSIS — I4891 Unspecified atrial fibrillation: Secondary | ICD-10-CM

## 2016-02-05 DIAGNOSIS — I359 Nonrheumatic aortic valve disorder, unspecified: Secondary | ICD-10-CM | POA: Diagnosis not present

## 2016-02-05 DIAGNOSIS — G459 Transient cerebral ischemic attack, unspecified: Secondary | ICD-10-CM | POA: Diagnosis not present

## 2016-02-05 LAB — POCT INR: INR: 1.6

## 2016-02-19 ENCOUNTER — Ambulatory Visit (INDEPENDENT_AMBULATORY_CARE_PROVIDER_SITE_OTHER): Payer: Medicare HMO | Admitting: *Deleted

## 2016-02-19 DIAGNOSIS — I359 Nonrheumatic aortic valve disorder, unspecified: Secondary | ICD-10-CM | POA: Diagnosis not present

## 2016-02-19 DIAGNOSIS — G459 Transient cerebral ischemic attack, unspecified: Secondary | ICD-10-CM

## 2016-02-19 DIAGNOSIS — I4891 Unspecified atrial fibrillation: Secondary | ICD-10-CM | POA: Diagnosis not present

## 2016-02-19 DIAGNOSIS — Z8679 Personal history of other diseases of the circulatory system: Secondary | ICD-10-CM | POA: Diagnosis not present

## 2016-02-19 DIAGNOSIS — Z5181 Encounter for therapeutic drug level monitoring: Secondary | ICD-10-CM

## 2016-02-19 LAB — POCT INR: INR: 2.7

## 2016-02-25 ENCOUNTER — Encounter: Payer: Self-pay | Admitting: Internal Medicine

## 2016-02-25 ENCOUNTER — Ambulatory Visit (INDEPENDENT_AMBULATORY_CARE_PROVIDER_SITE_OTHER): Payer: Medicare HMO | Admitting: Internal Medicine

## 2016-02-25 ENCOUNTER — Other Ambulatory Visit (INDEPENDENT_AMBULATORY_CARE_PROVIDER_SITE_OTHER): Payer: Medicare HMO

## 2016-02-25 VITALS — BP 138/78 | HR 60 | Temp 98.0°F | Resp 20 | Wt 316.0 lb

## 2016-02-25 DIAGNOSIS — Z23 Encounter for immunization: Secondary | ICD-10-CM

## 2016-02-25 DIAGNOSIS — Z0000 Encounter for general adult medical examination without abnormal findings: Secondary | ICD-10-CM

## 2016-02-25 DIAGNOSIS — Z0001 Encounter for general adult medical examination with abnormal findings: Secondary | ICD-10-CM | POA: Insufficient documentation

## 2016-02-25 LAB — LIPID PANEL
CHOLESTEROL: 137 mg/dL (ref 0–200)
HDL: 32.4 mg/dL — AB (ref >39.00)
NonHDL: 105.08
TRIGLYCERIDES: 210 mg/dL — AB (ref 0.0–149.0)
Total CHOL/HDL Ratio: 4
VLDL: 42 mg/dL — AB (ref 0.0–40.0)

## 2016-02-25 LAB — BASIC METABOLIC PANEL
BUN: 12 mg/dL (ref 6–23)
CO2: 30 mEq/L (ref 19–32)
Calcium: 8.6 mg/dL (ref 8.4–10.5)
Chloride: 103 mEq/L (ref 96–112)
Creatinine, Ser: 1.04 mg/dL (ref 0.40–1.50)
GFR: 96.81 mL/min (ref >60.00)
GLUCOSE: 207 mg/dL — AB (ref 70–99)
POTASSIUM: 3.8 meq/L (ref 3.5–5.1)
Sodium: 139 mEq/L (ref 135–145)

## 2016-02-25 LAB — URINALYSIS, ROUTINE W REFLEX MICROSCOPIC
KETONES UR: NEGATIVE
LEUKOCYTES UA: NEGATIVE
NITRITE: NEGATIVE
PH: 6 (ref 5.0–8.0)
Specific Gravity, Urine: >=1.03 — AB (ref 1.000–1.030)
URINE GLUCOSE: NEGATIVE
Urobilinogen, UA: 0.2 (ref 0.0–1.0)

## 2016-02-25 LAB — CBC WITH DIFFERENTIAL/PLATELET
BASOS PCT: 0.3 % (ref 0.0–3.0)
Basophils Absolute: 0 10*3/uL (ref 0.0–0.1)
EOS PCT: 4.6 % (ref 0.0–5.0)
Eosinophils Absolute: 0.3 10*3/uL (ref 0.0–0.7)
HCT: 40 % (ref 39.0–52.0)
HEMOGLOBIN: 13.6 g/dL (ref 13.0–17.0)
Lymphocytes Relative: 24.6 % (ref 12.0–46.0)
Lymphs Abs: 1.5 10*3/uL (ref 0.7–4.0)
MCHC: 33.8 g/dL (ref 30.0–36.0)
MCV: 82.6 fl (ref 78.0–100.0)
MONOS PCT: 11.2 % (ref 3.0–12.0)
Monocytes Absolute: 0.7 10*3/uL (ref 0.1–1.0)
Neutro Abs: 3.5 10*3/uL (ref 1.4–7.7)
Neutrophils Relative %: 59.3 % (ref 43.0–77.0)
Platelets: 153 10*3/uL (ref 150.0–400.0)
RBC: 4.85 Mil/uL (ref 4.22–5.81)
RDW: 15.1 % (ref 11.5–15.5)
WBC: 5.9 10*3/uL (ref 4.0–10.5)

## 2016-02-25 LAB — LDL CHOLESTEROL, DIRECT: Direct LDL: 61 mg/dL

## 2016-02-25 LAB — HEPATIC FUNCTION PANEL
ALBUMIN: 3.7 g/dL (ref 3.5–5.2)
ALK PHOS: 65 U/L (ref 39–117)
ALT: 18 U/L (ref 0–53)
AST: 20 U/L (ref 0–37)
BILIRUBIN TOTAL: 1.1 mg/dL (ref 0.2–1.2)
Bilirubin, Direct: 0.2 mg/dL (ref 0.0–0.3)
Total Protein: 7.2 g/dL (ref 6.0–8.3)

## 2016-02-25 LAB — TSH: TSH: 2.49 u[IU]/mL (ref 0.35–4.50)

## 2016-02-25 LAB — PSA: PSA: 0.68 ng/mL (ref 0.10–4.00)

## 2016-02-25 NOTE — Progress Notes (Signed)
Subjective:    Patient ID: [name], male    DOB: [birth year], 50 y.o.   MRN: [id number]  HPI  Here for wellness and f/u;  Overall doing ok;  Pt denies Chest pain, worsening SOB, DOE, wheezing, orthopnea, PND, worsening LE edema, palpitations, dizziness or syncope.  Pt denies neurological change such as new headache, facial or extremity weakness.  Pt denies polydipsia, polyuria, or low sugar symptoms. Pt states overall good compliance with treatment and medications, good tolerability, and has been trying to follow appropriate diet.  Pt denies worsening depressive symptoms, suicidal ideation or panic. No fever, night sweats, wt loss, loss of appetite, or other constitutional symptoms.  Pt states good ability with ADL's, has low fall risk, home safety reviewed and adequate, no other significant changes in hearing or vision, and only occasionally active with exercise.  Had cologuard , neg result.   Denies hyper or hypo thyroid symptoms such as voice, skin or hair change, asks for thyroid check.   Past Medical History:  Diagnosis Date  . Ascending aortic dissection ([place])    a. 2000 - with aortic valve involvement. S/p repair of aortic dissection with placement of mechanical AVR.  [name] CAD (coronary artery disease)   . Cholelithiasis 08/22/2013  . Diabetes mellitus   . Gross hematuria   . H/O cardiovascular stress test    a. Myoview 2011: walked for 61min, images with EF 39%, prominent inf scar, mild reversibility. b. Cardiac CT 06/2010: no CAD, only mild plaque in prox RCA. c. EF normal by echo 2014.  . H/O mechanical aortic valve replacement   . Hyperlipidemia   . Hypertension   . NEPHROLITHIASIS, HX OF   . Obesity   . PAF (paroxysmal atrial fibrillation) ([place])    a. H/o such, with recurrence of coarse afib/flutter during ER consult 03/2012.  [name] TRANSIENT ISCHEMIC ATTACK, HX OF    a. In 2004.  [name] Unspecified vitamin D deficiency    Past Surgical History:  Procedure Laterality Date  . AORTIC  VALVE REPLACEMENT  2000  . APPENDECTOMY  1998  . CORONARY ARTERY BYPASS GRAFT  2000  . LEFT AND RIGHT HEART CATHETERIZATION WITH CORONARY ANGIOGRAM N/A 07/19/2013   Procedure: LEFT AND RIGHT HEART CATHETERIZATION WITH CORONARY ANGIOGRAM;  Surgeon: [name], MD;  Location: [facility] CATH LAB;  Service: Cardiovascular;  Laterality: N/A;  . TEE WITHOUT CARDIOVERSION N/A 07/22/2013   Procedure: TRANSESOPHAGEAL ECHOCARDIOGRAM (TEE);  Surgeon: [name], MD;  Location: [facility] ENDOSCOPY;  Service: Cardiovascular;  Laterality: N/A;    reports that he has never smoked. He has never used smokeless tobacco. He reports that he does not drink alcohol or use drugs. family history includes Coronary artery disease in his mother; Coronary artery disease (age of onset: 39) in his other; Diabetes in his brother; Hypertension in his mother. Allergies  Allergen Reactions  . Insulin Glargine Swelling    edema   Current Outpatient Prescriptions on File Prior to Visit  Medication Sig Dispense Refill  . acarbose (PRECOSE) 25 MG tablet Take 1 tablet (25 mg total) by mouth 3 (three) times daily with meals. 270 tablet 3  . bromocriptine (PARLODEL) 2.5 MG tablet 1/4 tab daily 22 tablet 11  . canagliflozin (INVOKANA) 100 MG TABS tablet Take 1 tablet (100 mg total) by mouth daily before breakfast. 30 tablet 11  . cholecalciferol (VITAMIN D) 1000 UNITS tablet Take 1,000 Units by mouth daily.    . cloNIDine (CATAPRES) 0.1 MG tablet TAKE 1 TABLET  TWICE DAILY (NEED MD APPOINTMENT) 180 tablet 1  . furosemide (LASIX) 40 MG tablet Take 1 tablet (40 mg total) by mouth daily. 90 tablet 3  . glimepiride (AMARYL) 4 MG tablet Take 1 tablet (4 mg total) by mouth daily before breakfast. 90 tablet 3  . levothyroxine (SYNTHROID, LEVOTHROID) 50 MCG tablet Take 1 tablet (50 mcg total) by mouth daily. 90 tablet 3  . losartan (COZAAR) 100 MG tablet TAKE 1 TABLET EVERY DAY 90 tablet 1  . metFORMIN (GLUCOPHAGE-XR) 500 MG 24 hr tablet Take 4  tablets (2,000 mg total) by mouth daily. 360 tablet 3  . metoprolol (TOPROL-XL) 200 MG 24 hr tablet Take 1 tablet (200 mg total) by mouth daily. 90 tablet 3  . nitroGLYCERIN (NITROSTAT) 0.4 MG SL tablet Place 1 tablet (0.4 mg total) under the tongue every 5 (five) minutes x 3 doses as needed for chest pain. 25 tablet 2  . potassium chloride SA (K-DUR,KLOR-CON) 20 MEQ tablet Take 2 tablets (40 mEq total) by mouth daily. 180 tablet 2  . pravastatin (PRAVACHOL) 40 MG tablet Take 1 tablet (40 mg total) by mouth daily. 90 tablet 3  . sitaGLIPtin (JANUVIA) 100 MG tablet Take 1 tablet (100 mg total) by mouth daily. 30 tablet 11  . warfarin (COUMADIN) 5 MG tablet TAKE AS DIRECTED BY COUMADIN CLINIC 135 tablet 1   No current facility-administered medications on file prior to visit.    Review of Systems Constitutional: Negative for increased diaphoresis, or other activity, appetite or siginficant weight change other than noted HENT: Negative for worsening hearing loss, ear pain, facial swelling, mouth sores and neck stiffness.   Eyes: Negative for other worsening pain, redness or visual disturbance.  Respiratory: Negative for choking or stridor Cardiovascular: Negative for other chest pain and palpitations.  Gastrointestinal: Negative for worsening diarrhea, blood in stool, or abdominal distention Genitourinary: Negative for hematuria, flank pain or change in urine volume.  Musculoskeletal: Negative for myalgias or other joint complaints.  Skin: Negative for other color change and wound or drainage.  Neurological: Negative for syncope and numbness. other than noted Hematological: Negative for adenopathy. or other swelling Psychiatric/Behavioral: Negative for hallucinations, SI, self-injury, decreased concentration or other worsening agitation.      Objective:   Physical Exam BP 138/78   Pulse 60   Temp 98 F (36.7 C) (Oral)   Resp 20   Wt (!) 316 lb (143.3 kg)   SpO2 96%   BMI 40.57 kg/m    VS noted,  Constitutional: Pt is oriented to person, place, and time. Appears well-developed and well-nourished, in no significant distress Head: Normocephalic and atraumatic  Eyes: Conjunctivae and EOM are normal. Pupils are equal, round, and reactive to light Right Ear: External ear normal.  Left Ear: External ear normal Nose: Nose normal.  Mouth/Throat: Oropharynx is clear and moist  Neck: Normal range of motion. Neck supple. No JVD present. No tracheal deviation present or significant neck LA or mass Cardiovascular: Normal rate, regular rhythm, normal heart sounds and intact distal pulses.   Pulmonary/Chest: Effort normal and breath sounds without rales or wheezing  Abdominal: Soft. Bowel sounds are normal. NT. No HSM  Musculoskeletal: Normal range of motion. Exhibits no edema Lymphadenopathy: Has no cervical adenopathy.  Neurological: Pt is alert and oriented to person, place, and time. Pt has normal reflexes. No cranial nerve deficit. Motor grossly intact Skin: Skin is warm and dry. No rash noted or new ulcers Psychiatric:  Has normal mood and affect. Behavior  is normal.   Hemoglobin A1C 8.3     Specimen Collected: 01/19/16 15:10     Lab Results  Component Value Date   WBC 5.8 09/03/2015   HGB 14.0 09/03/2015   HCT 41.5 09/03/2015   PLT 152.0 09/03/2015   GLUCOSE 324 (H) 09/03/2015   CHOL 157 09/03/2015   TRIG 248.0 (H) 09/03/2015   HDL 34.70 (L) 09/03/2015   LDLDIRECT 60.0 09/03/2015   LDLCALC 75 02/21/2014   ALT 9 09/03/2015   AST 14 09/03/2015   NA 135 09/03/2015   K 4.3 09/03/2015   CL 99 09/03/2015   CREATININE 1.22 09/03/2015   BUN 16 09/03/2015   CO2 28 09/03/2015   TSH 3.11 09/03/2015   PSA 0.63 09/03/2015   INR 2.7 02/19/2016   HGBA1C 8.3 01/19/2016   MICROALBUR 6.1 (H) 08/26/2014       Assessment & Plan:

## 2016-02-25 NOTE — Progress Notes (Signed)
Pre visit review using our clinic review tool, if applicable. No additional management support is needed unless otherwise documented below in the visit note. 

## 2016-02-25 NOTE — Patient Instructions (Addendum)
You had the flu shot today  Please continue all other medications as before, and refills have been done if requested.  Please have the pharmacy call with any other refills you may need.  Please continue your efforts at being more active, low cholesterol diet, and weight control.  You are otherwise up to date with prevention measures today.  Please keep your appointments with your specialists as you may have planned  Please go to the LAB in the Basement (turn left off the elevator) for the tests to be done today  You will be contacted by phone if any changes need to be made immediately.  Otherwise, you will receive a letter about your results with an explanation, but please check with MyChart first.  Please remember to sign up for MyChart if you have not done so, as this will be important to you in the future with finding out test results, communicating by private email, and scheduling acute appointments online when needed.  Please return in 1 year for your yearly visit, or sooner if needed, with Lab testing done 3-5 days before  

## 2016-02-25 NOTE — Assessment & Plan Note (Signed)

## 2016-03-04 ENCOUNTER — Ambulatory Visit: Payer: Medicare Other | Admitting: Internal Medicine

## 2016-03-11 ENCOUNTER — Ambulatory Visit (INDEPENDENT_AMBULATORY_CARE_PROVIDER_SITE_OTHER): Payer: Medicare HMO

## 2016-03-11 DIAGNOSIS — I480 Paroxysmal atrial fibrillation: Secondary | ICD-10-CM | POA: Diagnosis not present

## 2016-03-11 DIAGNOSIS — Z5181 Encounter for therapeutic drug level monitoring: Secondary | ICD-10-CM | POA: Diagnosis not present

## 2016-03-11 LAB — POCT INR: INR: 1.8

## 2016-03-21 ENCOUNTER — Ambulatory Visit: Payer: Medicare HMO | Admitting: Endocrinology

## 2016-03-21 DIAGNOSIS — Z0289 Encounter for other administrative examinations: Secondary | ICD-10-CM

## 2016-03-30 ENCOUNTER — Encounter (HOSPITAL_COMMUNITY): Payer: Self-pay | Admitting: Emergency Medicine

## 2016-03-30 ENCOUNTER — Inpatient Hospital Stay (HOSPITAL_COMMUNITY)
Admission: EM | Admit: 2016-03-30 | Discharge: 2016-04-06 | DRG: 292 | Disposition: A | Discharge status: Home / Self Care | Payer: Medicare HMO | Attending: Interventional Cardiology | Admitting: Interventional Cardiology

## 2016-03-30 ENCOUNTER — Emergency Department (HOSPITAL_COMMUNITY): Payer: Medicare HMO

## 2016-03-30 DIAGNOSIS — I2510 Atherosclerotic heart disease of native coronary artery without angina pectoris: Secondary | ICD-10-CM | POA: Diagnosis present

## 2016-03-30 DIAGNOSIS — I110 Hypertensive heart disease with heart failure: Secondary | ICD-10-CM | POA: Diagnosis present

## 2016-03-30 DIAGNOSIS — Z833 Family history of diabetes mellitus: Secondary | ICD-10-CM

## 2016-03-30 DIAGNOSIS — N179 Acute kidney failure, unspecified: Secondary | ICD-10-CM | POA: Diagnosis not present

## 2016-03-30 DIAGNOSIS — Z951 Presence of aortocoronary bypass graft: Secondary | ICD-10-CM | POA: Diagnosis not present

## 2016-03-30 DIAGNOSIS — E785 Hyperlipidemia, unspecified: Secondary | ICD-10-CM | POA: Diagnosis present

## 2016-03-30 DIAGNOSIS — Z9889 Other specified postprocedural states: Secondary | ICD-10-CM

## 2016-03-30 DIAGNOSIS — E039 Hypothyroidism, unspecified: Secondary | ICD-10-CM | POA: Diagnosis present

## 2016-03-30 DIAGNOSIS — I509 Heart failure, unspecified: Secondary | ICD-10-CM

## 2016-03-30 DIAGNOSIS — I10 Essential (primary) hypertension: Secondary | ICD-10-CM | POA: Diagnosis present

## 2016-03-30 DIAGNOSIS — I25708 Atherosclerosis of coronary artery bypass graft(s), unspecified, with other forms of angina pectoris: Secondary | ICD-10-CM | POA: Diagnosis not present

## 2016-03-30 DIAGNOSIS — E876 Hypokalemia: Secondary | ICD-10-CM | POA: Diagnosis not present

## 2016-03-30 DIAGNOSIS — Z8249 Family history of ischemic heart disease and other diseases of the circulatory system: Secondary | ICD-10-CM

## 2016-03-30 DIAGNOSIS — Z8673 Personal history of transient ischemic attack (TIA), and cerebral infarction without residual deficits: Secondary | ICD-10-CM

## 2016-03-30 DIAGNOSIS — E669 Obesity, unspecified: Secondary | ICD-10-CM | POA: Diagnosis present

## 2016-03-30 DIAGNOSIS — T501X5A Adverse effect of loop [high-ceiling] diuretics, initial encounter: Secondary | ICD-10-CM | POA: Diagnosis not present

## 2016-03-30 DIAGNOSIS — I480 Paroxysmal atrial fibrillation: Secondary | ICD-10-CM | POA: Diagnosis present

## 2016-03-30 DIAGNOSIS — I7101 Dissection of ascending aorta: Secondary | ICD-10-CM | POA: Diagnosis present

## 2016-03-30 DIAGNOSIS — I25810 Atherosclerosis of coronary artery bypass graft(s) without angina pectoris: Secondary | ICD-10-CM | POA: Diagnosis present

## 2016-03-30 DIAGNOSIS — E119 Type 2 diabetes mellitus without complications: Secondary | ICD-10-CM | POA: Diagnosis present

## 2016-03-30 DIAGNOSIS — Z7901 Long term (current) use of anticoagulants: Secondary | ICD-10-CM

## 2016-03-30 DIAGNOSIS — Z7984 Long term (current) use of oral hypoglycemic drugs: Secondary | ICD-10-CM | POA: Diagnosis not present

## 2016-03-30 DIAGNOSIS — Z952 Presence of prosthetic heart valve: Secondary | ICD-10-CM

## 2016-03-30 DIAGNOSIS — I5032 Chronic diastolic (congestive) heart failure: Secondary | ICD-10-CM | POA: Diagnosis present

## 2016-03-30 DIAGNOSIS — Z6837 Body mass index (BMI) 37.0-37.9, adult: Secondary | ICD-10-CM

## 2016-03-30 DIAGNOSIS — I5033 Acute on chronic diastolic (congestive) heart failure: Secondary | ICD-10-CM | POA: Diagnosis present

## 2016-03-30 DIAGNOSIS — Z955 Presence of coronary angioplasty implant and graft: Secondary | ICD-10-CM

## 2016-03-30 DIAGNOSIS — I5042 Chronic combined systolic (congestive) and diastolic (congestive) heart failure: Secondary | ICD-10-CM | POA: Diagnosis present

## 2016-03-30 DIAGNOSIS — R0602 Shortness of breath: Secondary | ICD-10-CM | POA: Diagnosis present

## 2016-03-30 HISTORY — DX: Dyspnea, unspecified: R06.00

## 2016-03-30 LAB — PROTIME-INR
INR: 2.62
INR: 2.42
Prothrombin Time: 28.5 seconds — ABNORMAL HIGH (ref 11.4–15.2)
Prothrombin Time: 26.8 seconds — ABNORMAL HIGH (ref 11.4–15.2)

## 2016-03-30 LAB — I-STAT CHEM 8, ED
BUN: 12 mg/dL (ref 6–20)
CALCIUM ION: 1.16 mmol/L (ref 1.15–1.40)
CHLORIDE: 102 mmol/L (ref 101–111)
CREATININE: 1.1 mg/dL (ref 0.61–1.24)
Glucose, Bld: 253 mg/dL — ABNORMAL HIGH (ref 65–99)
HCT: 39 % (ref 39.0–52.0)
Hemoglobin: 13.3 g/dL (ref 13.0–17.0)
Potassium: 3.8 mmol/L (ref 3.5–5.1)
Sodium: 140 mmol/L (ref 135–145)
TCO2: 26 mmol/L (ref 0–100)

## 2016-03-30 LAB — CBC WITH DIFFERENTIAL/PLATELET
Basophils Absolute: 0 10*3/uL (ref 0.0–0.1)
Basophils Relative: 0 %
Eosinophils Absolute: 0.2 10*3/uL (ref 0.0–0.7)
Eosinophils Relative: 4 %
HEMATOCRIT: 37.1 % — AB (ref 39.0–52.0)
HEMOGLOBIN: 13.1 g/dL (ref 13.0–17.0)
LYMPHS ABS: 1.4 10*3/uL (ref 0.7–4.0)
LYMPHS PCT: 26 %
MCH: 27.6 pg (ref 26.0–34.0)
MCHC: 35.3 g/dL (ref 30.0–36.0)
MCV: 78.3 fL (ref 78.0–100.0)
MONOS PCT: 8 %
Monocytes Absolute: 0.5 10*3/uL (ref 0.1–1.0)
NEUTROS ABS: 3.4 10*3/uL (ref 1.7–7.7)
NEUTROS PCT: 62 %
Platelets: 138 10*3/uL — ABNORMAL LOW (ref 150–400)
RBC: 4.74 MIL/uL (ref 4.22–5.81)
RDW: 14.8 % (ref 11.5–15.5)
WBC: 5.5 10*3/uL (ref 4.0–10.5)

## 2016-03-30 LAB — GLUCOSE, CAPILLARY
GLUCOSE-CAPILLARY: 169 mg/dL — AB (ref 65–99)
GLUCOSE-CAPILLARY: 222 mg/dL — AB (ref 65–99)

## 2016-03-30 LAB — I-STAT TROPONIN, ED: Troponin i, poc: 0 ng/mL (ref 0.00–0.08)

## 2016-03-30 LAB — TROPONIN I

## 2016-03-30 LAB — BRAIN NATRIURETIC PEPTIDE: B NATRIURETIC PEPTIDE 5: 408.3 pg/mL — AB (ref 0.0–100.0)

## 2016-03-30 IMAGING — CR DG CHEST 1V PORT
1 series · 1 of 1 positions shown · non-contrast
Comparison: CT chest 10/09/2014. Single-view of the chest
04/18/2014.

CLINICAL DATA: Shortness of breath beginning yesterday.

EXAM:
PORTABLE CHEST 1 VIEW

[portable]
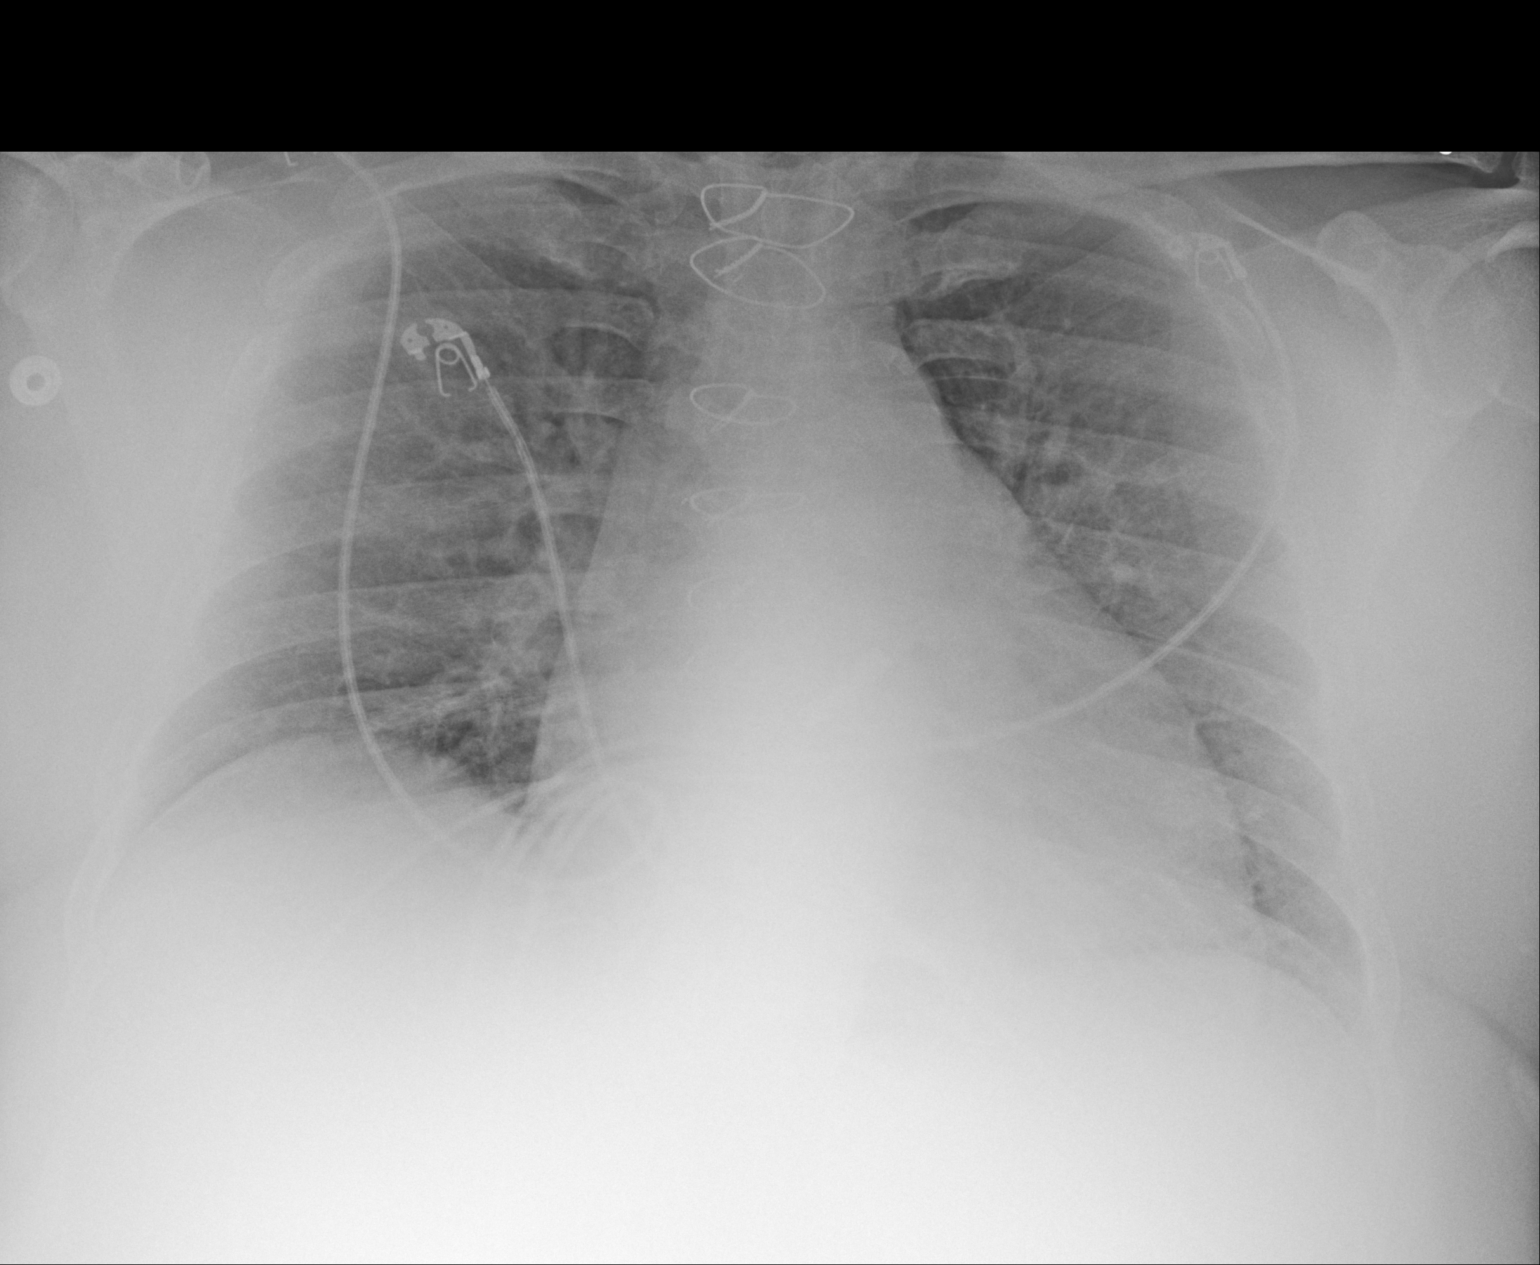

[1 of 1 positions shown; findings below may reference images not displayed]

FINDINGS: There is cardiomegaly and mild appearing interstitial edema. No
consolidative process, pneumothorax or effusion. The patient is
status post median sternotomy.
IMPRESSION: Cardiomegaly mild appearing interstitial edema.

## 2016-03-30 MED ORDER — METOPROLOL SUCCINATE ER 100 MG PO TB24
200.0000 mg | ORAL_TABLET | Freq: Every day | ORAL | Status: DC
Start: 2016-03-30 — End: 2016-04-06
  Administered 2016-03-30 – 2016-04-06 (×8): 200 mg via ORAL
  Filled 2016-03-30 (×8): qty 2

## 2016-03-30 MED ORDER — ACETAMINOPHEN 325 MG PO TABS: 650.0000 mg | ORAL_TABLET | ORAL | Status: DC | PRN

## 2016-03-30 MED ORDER — INSULIN ASPART 100 UNIT/ML ~~LOC~~ SOLN: 0.0000 [IU] | Freq: Three times a day (TID) | SUBCUTANEOUS | Status: DC

## 2016-03-30 MED ORDER — LOSARTAN POTASSIUM 50 MG PO TABS
100.0000 mg | ORAL_TABLET | Freq: Every day | ORAL | Status: DC
  Administered 2016-03-30 – 2016-04-06 (×8): 100 mg via ORAL
  Filled 2016-03-30 (×8): qty 2

## 2016-03-30 MED ORDER — FUROSEMIDE 10 MG/ML IJ SOLN
40.0000 mg | Freq: Once | INTRAMUSCULAR | Status: AC
  Administered 2016-03-30: 40 mg via INTRAVENOUS
  Filled 2016-03-30: qty 4

## 2016-03-30 MED ORDER — CANAGLIFLOZIN 100 MG PO TABS
100.0000 mg | ORAL_TABLET | Freq: Every day | ORAL | Status: DC
  Administered 2016-03-31 – 2016-04-06 (×7): 100 mg via ORAL
  Filled 2016-03-30 (×7): qty 1

## 2016-03-30 MED ORDER — ACARBOSE 25 MG PO TABS
25.0000 mg | ORAL_TABLET | Freq: Three times a day (TID) | ORAL | Status: DC
  Administered 2016-03-30 – 2016-04-06 (×21): 25 mg via ORAL
  Filled 2016-03-30 (×22): qty 1

## 2016-03-30 MED ORDER — INSULIN ASPART 100 UNIT/ML ~~LOC~~ SOLN
0.0000 [IU] | Freq: Three times a day (TID) | SUBCUTANEOUS | Status: DC
  Administered 2016-03-30 – 2016-03-31 (×2): 3 [IU] via SUBCUTANEOUS
  Administered 2016-03-31 – 2016-04-01 (×5): 5 [IU] via SUBCUTANEOUS
  Administered 2016-04-02: 2 [IU] via SUBCUTANEOUS
  Administered 2016-04-02 – 2016-04-03 (×5): 3 [IU] via SUBCUTANEOUS
  Administered 2016-04-04: 5 [IU] via SUBCUTANEOUS
  Administered 2016-04-04: 3 [IU] via SUBCUTANEOUS
  Administered 2016-04-04: 5 [IU] via SUBCUTANEOUS
  Administered 2016-04-05 – 2016-04-06 (×5): 3 [IU] via SUBCUTANEOUS

## 2016-03-30 MED ORDER — LEVOTHYROXINE SODIUM 50 MCG PO TABS
50.0000 ug | ORAL_TABLET | Freq: Every day | ORAL | Status: DC
  Administered 2016-03-30 – 2016-04-06 (×8): 50 ug via ORAL
  Filled 2016-03-30 (×8): qty 1

## 2016-03-30 MED ORDER — WARFARIN - PHARMACIST DOSING INPATIENT
Freq: Every day | Status: DC
  Administered 2016-03-30 – 2016-04-03 (×5)

## 2016-03-30 MED ORDER — FUROSEMIDE 10 MG/ML IJ SOLN
80.0000 mg | Freq: Two times a day (BID) | INTRAMUSCULAR | Status: DC
  Administered 2016-03-30 – 2016-04-03 (×8): 80 mg via INTRAVENOUS
  Filled 2016-03-30 (×8): qty 8

## 2016-03-30 MED ORDER — SODIUM CHLORIDE 0.9% FLUSH
3.0000 mL | INTRAVENOUS | Status: DC | PRN
Start: 2016-03-30 — End: 2016-04-06

## 2016-03-30 MED ORDER — POTASSIUM CHLORIDE CRYS ER 20 MEQ PO TBCR
40.0000 meq | EXTENDED_RELEASE_TABLET | Freq: Every day | ORAL | Status: DC
  Administered 2016-03-30 – 2016-04-03 (×5): 40 meq via ORAL
  Filled 2016-03-30 (×5): qty 2

## 2016-03-30 MED ORDER — WARFARIN SODIUM 5 MG PO TABS
5.0000 mg | ORAL_TABLET | Freq: Once | ORAL | Status: AC
  Administered 2016-03-30: 5 mg via ORAL
  Filled 2016-03-30 (×2): qty 1

## 2016-03-30 MED ORDER — GLIMEPIRIDE 4 MG PO TABS
4.0000 mg | ORAL_TABLET | Freq: Every day | ORAL | Status: DC
  Administered 2016-03-31 – 2016-04-06 (×7): 4 mg via ORAL
  Filled 2016-03-30 (×7): qty 1

## 2016-03-30 MED ORDER — ONDANSETRON HCL 4 MG/2ML IJ SOLN: 4.0000 mg | Freq: Four times a day (QID) | INTRAMUSCULAR | Status: DC | PRN

## 2016-03-30 MED ORDER — CLONIDINE HCL 0.1 MG PO TABS
0.1000 mg | ORAL_TABLET | Freq: Two times a day (BID) | ORAL | Status: DC
  Administered 2016-03-30 – 2016-04-06 (×14): 0.1 mg via ORAL
  Filled 2016-03-30 (×14): qty 1

## 2016-03-30 MED ORDER — SODIUM CHLORIDE 0.9% FLUSH
3.0000 mL | Freq: Two times a day (BID) | INTRAVENOUS | Status: DC
  Administered 2016-03-30 – 2016-04-05 (×12): 3 mL via INTRAVENOUS

## 2016-03-30 MED ORDER — ASPIRIN EC 81 MG PO TBEC
81.0000 mg | DELAYED_RELEASE_TABLET | Freq: Every day | ORAL | Status: DC
  Administered 2016-03-30 – 2016-04-06 (×8): 81 mg via ORAL
  Filled 2016-03-30 (×8): qty 1

## 2016-03-30 MED ORDER — PRAVASTATIN SODIUM 40 MG PO TABS
40.0000 mg | ORAL_TABLET | Freq: Every day | ORAL | Status: DC
  Administered 2016-03-30 – 2016-04-06 (×8): 40 mg via ORAL
  Filled 2016-03-30 (×8): qty 1

## 2016-03-30 MED ORDER — SODIUM CHLORIDE 0.9 % IV SOLN: 250.0000 mL | INTRAVENOUS | Status: DC | PRN

## 2016-03-30 NOTE — ED Triage Notes (Signed)
Pt here for increased SOB starting yesterday with exertion; pt denies CP

## 2016-03-30 NOTE — Progress Notes (Signed)
ANTICOAGULATION CONSULT NOTE - Initial Consult  Pharmacy Consult for warfarin Indication: mechanical valve  Allergies  Allergen Reactions  . Insulin Glargine Swelling    edema   Vital Signs: Temp: 98 F (36.7 C) (11/08 0741) Temp Source: Oral (11/08 0741) BP: 139/84 (11/08 1400) Pulse Rate: 61 (11/08 1400)  Labs:  Recent Labs  03/30/16 0805 03/30/16 0822  HGB 13.1 13.3  HCT 37.1* 39.0  PLT 138*  --   LABPROT 28.5*  --   INR 2.62  --   CREATININE  --  1.10    CrCl cannot be calculated (Unknown ideal weight.).   Medical History: Past Medical History:  Diagnosis Date  . Acute on chronic diastolic CHF (congestive heart failure) ([place]) 03/30/2016  . Ascending aortic dissection ([place])    a. 2000 - with aortic valve involvement. S/p repair of aortic dissection with placement of mechanical AVR.  [name] CAD (coronary artery disease)   . Cholelithiasis 08/22/2013  . Diabetes mellitus   . Gross hematuria   . H/O cardiovascular stress test    a. Myoview 2011: walked for 6min, images with EF 39%, prominent inf scar, mild reversibility. b. Cardiac CT 06/2010: no CAD, only mild plaque in prox RCA. c. EF normal by echo 2014.  . H/O mechanical aortic valve replacement   . Hyperlipidemia   . Hypertension   . NEPHROLITHIASIS, HX OF   . Obesity   . PAF (paroxysmal atrial fibrillation) ([place])    a. H/o such, with recurrence of coarse afib/flutter during ER consult 03/2012.  [name] TRANSIENT ISCHEMIC ATTACK, HX OF    a. In 2004.  [name] Unspecified vitamin D deficiency     Assessment: 50 yo male with PMH of AAA s/p bentall, CAD s/p BMS to SVG to RCA, DM, HTN, HLD and PAF on warfarin prior to admission. INR 2.62, HgB 13.1, Plt 138. Will continue home dose since therapeutic.  Warfarin PTA: Warfarin 7.5mg  on Mondays and Fridays and 5mg  all oter days  Goal of Therapy:  INR 2-3 Monitor platelets by anticoagulation protocol: Yes   Plan:  Warfarin 5mg  once Monitor s/sx bleeding Daily INR  Zaelyn Noack L  Verda Mehta 03/30/2016,3:31 PM

## 2016-03-30 NOTE — H&P (Signed)
History & Physical    Patient ID: [name] MRN: [id number], DOB/AGE: [birth year]   Admit date: 03/30/2016   Primary Physician: Cathlean Cower, MD Primary Cardiologist: Dr. Stanford Breed   Patient Profile    50 yo male with PMH of AAA s/p bentall, CAD s/p BMS to SVG to RCA, DM, HTN, HLD and PAF who presented with c/o dyspnea and orthopnea for 2 days.   Past Medical History    Past Medical History:  Diagnosis Date  . Ascending aortic dissection ([place])    a. 2000 - with aortic valve involvement. S/p repair of aortic dissection with placement of mechanical AVR.  [name] CAD (coronary artery disease)   . Cholelithiasis 08/22/2013  . Diabetes mellitus   . Gross hematuria   . H/O cardiovascular stress test    a. Myoview 2011: walked for 96min, images with EF 39%, prominent inf scar, mild reversibility. b. Cardiac CT 06/2010: no CAD, only mild plaque in prox RCA. c. EF normal by echo 2014.  . H/O mechanical aortic valve replacement   . Hyperlipidemia   . Hypertension   . NEPHROLITHIASIS, HX OF   . Obesity   . PAF (paroxysmal atrial fibrillation) ([place])    a. H/o such, with recurrence of coarse afib/flutter during ER consult 03/2012.  [name] TRANSIENT ISCHEMIC ATTACK, HX OF    a. In 2004.  [name] Unspecified vitamin D deficiency     Past Surgical History:  Procedure Laterality Date  . AORTIC VALVE REPLACEMENT  2000  . APPENDECTOMY  1998  . CORONARY ARTERY BYPASS GRAFT  2000  . LEFT AND RIGHT HEART CATHETERIZATION WITH CORONARY ANGIOGRAM N/A 07/19/2013   Procedure: LEFT AND RIGHT HEART CATHETERIZATION WITH CORONARY ANGIOGRAM;  Surgeon: [name], MD;  Location: [facility] CATH LAB;  Service: Cardiovascular;  Laterality: N/A;  . TEE WITHOUT CARDIOVERSION N/A 07/22/2013   Procedure: TRANSESOPHAGEAL ECHOCARDIOGRAM (TEE);  Surgeon: [name], MD;  Location: [facility] ENDOSCOPY;  Service: Cardiovascular;  Laterality: N/A;     Allergies  Allergies  Allergen Reactions  . Insulin Glargine Swelling   edema    History of Present Illness    [name] is a 50 yo male with PMH of AAA s/p bentall, CAD s/p BMS to SVG to RCA, DM, HTN, HLD and PAF. He has a h/o spontaneous ascending aortic dissection with aortic valve involvement status post mechanical AVR with ascending aorta conduit in 2000, and is followed by CVTS.   He underwent LHC in 2/15 showing patent left main, LAD, circumflex and an occluded right coronary artery. The saphenous vein graft to the right coronary artery had an ostial 90% lesion. The patient had PCI of the saphenous vein graft to the right coronary artery, with BMS placement. He was placed on plavix, along with coumadin for one month, and then resumed on low dose asa and coumadin. During that admission his BNP was noted to be elevated and he was placed on lasix.   Reports he has been doing well, in his usual state of health. Works out 4-5 days a week at [employer]. Denies any anginal symptoms or dyspnea with exercise routine. States 2 days ago he began to develop dyspnea on exertion, and orthopnea. He noticed he was unable to lay down at night, normally uses 2 pillows at night, but had been sleeping propped up at night. Also noticed lower extremity edema. States he has been compliant with his home medications, including his coumadin. He sticks to a very strict diet with mostly  protein, and generally avoids fatty, greasy foods. This morning he was getting out of the shower and felt very short of breath, prompting him to come to the ED.   Labs showed an elevated BNP 408, electrolytes were stable, INR 2.6, CXR with edema. EKg showed SR with q waves in lead III, aVF, with TWI noted on previous tracings. He was given IV lasix in the ED with good UOP thus far.   Home Medications    Prior to Admission medications   Medication Sig Start Date End Date Taking? Authorizing Provider  acarbose (PRECOSE) 25 MG tablet Take 1 tablet (25 mg total) by mouth 3 (three) times daily with meals. 11/06/15   Yes Renato Shin, MD  bromocriptine (PARLODEL) 2.5 MG tablet 1/4 tab daily 11/06/15  Yes Renato Shin, MD  canagliflozin [facility]) 100 MG TABS tablet Take 1 tablet (100 mg total) by mouth daily before breakfast. 01/19/16  Yes Renato Shin, MD  cholecalciferol (VITAMIN D) 1000 UNITS tablet Take 1,000 Units by mouth daily.   Yes Historical Provider, MD  cloNIDine (CATAPRES) 0.1 MG tablet TAKE 1 TABLET TWICE DAILY (NEED MD APPOINTMENT) 07/28/15  Yes Biagio Borg, MD  furosemide (LASIX) 40 MG tablet Take 1 tablet (40 mg total) by mouth daily. 01/19/16  Yes Renato Shin, MD  glimepiride (AMARYL) 4 MG tablet Take 1 tablet (4 mg total) by mouth daily before breakfast. 11/06/15  Yes Renato Shin, MD  levothyroxine (SYNTHROID, LEVOTHROID) 50 MCG tablet Take 1 tablet (50 mcg total) by mouth daily. 12/08/15  Yes Renato Shin, MD  losartan (COZAAR) 100 MG tablet TAKE 1 TABLET EVERY DAY 10/07/15  Yes Biagio Borg, MD  metFORMIN (GLUCOPHAGE-XR) 500 MG 24 hr tablet Take 4 tablets (2,000 mg total) by mouth daily. 11/06/15  Yes Renato Shin, MD  metoprolol (TOPROL-XL) 200 MG 24 hr tablet Take 1 tablet (200 mg total) by mouth daily. 12/08/15  Yes Renato Shin, MD  nitroGLYCERIN (NITROSTAT) 0.4 MG SL tablet Place 1 tablet (0.4 mg total) under the tongue every 5 (five) minutes x 3 doses as needed for chest pain. 07/25/13  Yes Luke K Kilroy, PA-C  potassium chloride SA (K-DUR,KLOR-CON) 20 MEQ tablet Take 2 tablets (40 mEq total) by mouth daily. 11/16/15  Yes Biagio Borg, MD  pravastatin (PRAVACHOL) 40 MG tablet Take 1 tablet (40 mg total) by mouth daily. 12/08/15  Yes Renato Shin, MD  sitaGLIPtin (JANUVIA) 100 MG tablet Take 1 tablet (100 mg total) by mouth daily. 12/09/15  Yes Renato Shin, MD  warfarin (COUMADIN) 5 MG tablet TAKE AS DIRECTED BY COUMADIN CLINIC Patient taking differently: pt takes 7.5mg  on Mondays and Fridays - takes 5mg  all other days 10/07/15  Yes Deboraha Sprang, MD    Family History    Family History    Problem Relation Age of Onset  . Coronary artery disease Mother   . Hypertension Mother   . Diabetes Brother   . Coronary artery disease Other 50    male 1st degree relative, CABG in his 93's (father)    Social History    Social History   Social History  . Marital status: Married    Spouse name: N/A  . Number of children: 4  . Years of education: N/A   Occupational History  . DISABLED Unemployed   Social History Main Topics  . Smoking status: Never Smoker  . Smokeless tobacco: Never Used  . Alcohol use No  . Drug use: No  . Sexual activity: Not  on file   Other Topics Concern  . Not on file   Social History Narrative  . No narrative on file     Review of Systems    General:  No chills, fever, night sweats or weight changes.  Cardiovascular: See HPI Dermatological: No rash, lesions/masses Respiratory: No cough, dyspnea Urologic: No hematuria, dysuria Abdominal:   No nausea, vomiting, diarrhea, bright red blood per rectum, melena, or hematemesis Neurologic:  No visual changes, wkns, changes in mental status. All other systems reviewed and are otherwise negative except as noted above.  Physical Exam    Blood pressure 142/86, pulse 63, temperature 98 F (36.7 C), temperature source Oral, resp. rate 20, SpO2 98 %.  General: Pleasant AA male, NAD Psych: Normal affect. Neuro: Alert and oriented X 3. Moves all extremities spontaneously. HEENT: Normal  Neck: Supple without bruits, mild JVD. Lungs:  Resp regular and unlabored, basilar crackles. Heart: RRR no s3, s4, or murmurs, crisp mechanical valve click. Abdomen: Soft, non-tender, non-distended, BS + x 4.  Extremities: No clubbing, cyanosis, 1+ bilateral LE edema. DP/PT/Radials 2+ and equal bilaterally.  Labs    Troponin [facility] Test)  Recent Labs  03/30/16 0821  TROPIPOC 0.00   No results for input(s): CKTOTAL, CKMB, TROPONINI in the last 72 hours. Lab Results  Component Value Date   WBC 5.5  03/30/2016   HGB 13.3 03/30/2016   HCT 39.0 03/30/2016   MCV 78.3 03/30/2016   PLT 138 (L) 03/30/2016    Recent Labs Lab 03/30/16 0822  NA 140  K 3.8  CL 102  BUN 12  CREATININE 1.10  GLUCOSE 253*   Lab Results  Component Value Date   CHOL 137 02/25/2016   HDL 32.40 (L) 02/25/2016   LDLCALC 75 02/21/2014   TRIG 210.0 (H) 02/25/2016   Lab Results  Component Value Date   DDIMER (H) 05/26/2010    0.72        AT THE INHOUSE ESTABLISHED CUTOFF VALUE OF 0.48 ug/mL FEU, THIS ASSAY HAS BEEN DOCUMENTED IN THE LITERATURE TO HAVE A SENSITIVITY AND NEGATIVE PREDICTIVE VALUE OF AT LEAST 98 TO 99%.  THE TEST RESULT SHOULD BE CORRELATED WITH AN ASSESSMENT OF THE CLINICAL PROBABILITY OF DVT / VTE.     Radiology Studies    Dg Chest Portable 1 View  Result Date: 03/30/2016 CLINICAL DATA:  Shortness of breath beginning yesterday. EXAM: PORTABLE CHEST 1 VIEW COMPARISON:  CT chest 10/09/2014. Single-view of the chest 04/18/2014. FINDINGS: There is cardiomegaly and mild appearing interstitial edema. No consolidative process, pneumothorax or effusion. The patient is status post median sternotomy. IMPRESSION: Cardiomegaly mild appearing interstitial edema. Electronically Signed   By: Inge Rise M.D.   On: 03/30/2016 08:54    ECG & Cardiac Imaging    EKG:  SR with q waves in lead III, aVF, with TWI noted on previous tracings.  Echo: 07/14/15  Study Conclusions  - Left ventricle: The cavity size was normal. Wall thickness was   increased in a pattern of moderate LVH. Systolic function was   normal. The estimated ejection fraction was in the range of 55%   to 60%. Wall motion was normal; there were no regional wall   motion abnormalities. Features are consistent with a pseudonormal   left ventricular filling pattern, with concomitant abnormal   relaxation and increased filling pressure (grade 2 diastolic   dysfunction). - Aortic valve: A mechanical prosthesis was present.  There was   trivial regurgitation. - Mitral  valve: There was mild regurgitation.  Impressions:  - Normal LV function; moderate LVH; grade 2 diastolic dysfunction;   prosthetic aortic valve with trace AI; mean gradient of 27 mmHg   higher than expected for mechanical valve.  Assessment & Plan    50 yo male with PMH of AAA s/p bentall, CAD s/p BMS to SVG to RCA, DM, HTN, HLD and PAF who presented with c/o dyspnea and orthopnea for 2 days.   1. Acute on chronic diastolic CHF: He is very compliant with his home medications. States he has not been taking lasix at home though, appears this was on his home medications list at his last office visit. States he was not aware that he should be taking lasix. Does have some volume overload on exam, along with elevated BNP and CXR with edema. Reports his weight has been variable at home, and unsure of why as he is very careful with his diet.  -- Will start IV lasix 80mg  BID -- Strict I&Os -- Daily weights -- check echo   2. PAF: SR on admission.  -- continue BB, and coumadin  3. HTN: Well controlled  4. CAD s/p BMS to SVG-RCA: Denies any anginal symptoms. No longer on plavix.   5. Hx AAA s/p bentall: Was on 81mg  ASA previously, will resume this admission as Hgb is stable and no reports of bleeding. Continue coumadin per pharmacy.   6. DM: On multiple home agents. Followed by endocrinology. Will hold metformin with IV diuresis.   7. Hypothyroidism: on synthroid   Weston Brass Reino Bellis, NP-C Pager [phone] 03/30/2016, 12:45 PM   The patient has been seen in conjunction with Reino Bellis, NP-C. All aspects of care have been considered and discussed. The patient has been personally interviewed, examined, and all clinical data has been reviewed.   The patient has history of aortic dissection/ascending aneurysm, status post Bentall procedure, hypertension, PAF, diabetes type 2, hypothyroidism, and chronic diastolic heart failure. He  gradually developed volume overload and pulmonary congestion. He is not on a diuretic as an outpatient however he was previously prescribed therapy. He does not understand when diuretic therapy was discontinued. He does not understand why therapy was discontinued. He denies chest pain.  Exam confirms a overload with elevated neck veins, peripheral edema, and pulmonary congestion.  Presumed acute on chronic diastolic heart failure. Rule out progression of systolic heart failure. Rule out right heart failure.  We will cycle cardiac markers to rule out myocardial ischemia/infarction. Repeat 2-D Doppler echocardiogram to assess LV systolic function and prosthetic valve function. We will aggressively diurese the patient.

## 2016-03-30 NOTE — ED Provider Notes (Signed)
[facility] DEPT Provider Note   CSN: [id number] Arrival date & time: 03/30/16  [id number]     History   Chief Complaint Chief Complaint  Patient presents with  . Shortness of Breath    HPI [name] is a 50 y.o. male.  HPI Patient presents for shortness of breath. Previous history of aortic dissection with repair. Also has had stent around a year ago for occluded saphenous vein graft to RCA. States that yesterday she was unable to do very much physical activity of all without shortness of breath. States he's been able to do things he normally be able to do. States he got very short of breath just walking to the ER. No swelling in his legs. He is on Coumadin. No chest pain. States the shortness of breath is like when he needed his stent, however he has had no chest pain this time. No swelling in his legs. No recent travel. No blood in the stool or black stools. No cough.   Past Medical History:  Diagnosis Date  . Acute on chronic diastolic CHF (congestive heart failure) ([place]) 03/30/2016  . Ascending aortic dissection ([place])    a. 2000 - with aortic valve involvement. S/p repair of aortic dissection with placement of mechanical AVR.  [name] CAD (coronary artery disease)   . Cholelithiasis 08/22/2013  . Diabetes mellitus   . Gross hematuria   . H/O cardiovascular stress test    a. Myoview 2011: walked for 42min, images with EF 39%, prominent inf scar, mild reversibility. b. Cardiac CT 06/2010: no CAD, only mild plaque in prox RCA. c. EF normal by echo 2014.  . H/O mechanical aortic valve replacement   . Hyperlipidemia   . Hypertension   . NEPHROLITHIASIS, HX OF   . Obesity   . PAF (paroxysmal atrial fibrillation) ([place])    a. H/o such, with recurrence of coarse afib/flutter during ER consult 03/2012.  [name] TRANSIENT ISCHEMIC ATTACK, HX OF    a. In 2004.  [name] Unspecified vitamin D deficiency     Patient Active Problem List   Diagnosis Date Noted  . Acute on chronic diastolic CHF  (congestive heart failure) ([place]) 03/30/2016  . Preventative health care 02/25/2016  . Screen for colon cancer 09/03/2015  . PTSD (post-traumatic stress disorder) 10/31/2013  . Depressive disorder 10/31/2013  . Hallucinations 10/31/2013  . Cholelithiasis 08/22/2013  . Unspecified hypothyroidism 08/22/2013  . Encounter for therapeutic drug monitoring 08/07/2013  . Unstable angina ([place]) 07/25/2013  . Moderate to severe aortic stenosis- pt will need AVR revision 07/24/2013  . CAD of artery bypass graft- SVG-RCA BMS 2/271/5 07/24/2013  . Hematoma of groin 07/24/2013  . Retroperitoneal bleed Nov 2014 07/24/2013  . Acute diastolic heart failure ([place]) 07/21/2013  . Ascending aortic dissection  in 2000- s/p Bentall and MDT tilting disc AVR   . PAF (paroxysmal atrial fibrillation) ([place])   . Bladder neck obstruction 05/04/2012  . Unspecified vitamin D deficiency 08/07/2008  . Gross hematuria 08/07/2008  . FATIGUE 12/26/2007  . TRANSIENT ISCHEMIC ATTACK, HX OF 12/26/2007  . Hyperlipidemia 08/20/2007  . Diabetes ([place]) 05/21/2007  . Essential hypertension 05/21/2007  . NEPHROLITHIASIS, HX OF 05/21/2007    Past Surgical History:  Procedure Laterality Date  . AORTIC VALVE REPLACEMENT  2000  . APPENDECTOMY  1998  . CORONARY ARTERY BYPASS GRAFT  2000  . LEFT AND RIGHT HEART CATHETERIZATION WITH CORONARY ANGIOGRAM N/A 07/19/2013   Procedure: LEFT AND RIGHT HEART CATHETERIZATION WITH CORONARY ANGIOGRAM;  Surgeon: Charlann Lange  Irish Lack, MD;  Location[facility] CATH LAB;  Service: Cardiovascular;  Laterality: N/A;  . TEE WITHOUT CARDIOVERSION N/A 07/22/2013   Procedure: TRANSESOPHAGEAL ECHOCARDIOGRAM (TEE);  Surgeon: [name], MD;  Location: [facility] ENDOSCOPY;  Service: Cardiovascular;  Laterality: N/A;       Home Medications    Prior to Admission medications   Medication Sig Start Date End Date Taking? Authorizing Provider  acarbose (PRECOSE) 25 MG tablet Take 1 tablet (25 mg total) by mouth 3 (three)  times daily with meals. 11/06/15  Yes Renato Shin, MD  bromocriptine (PARLODEL) 2.5 MG tablet 1/4 tab daily 11/06/15  Yes Renato Shin, MD  canagliflozin [facility]) 100 MG TABS tablet Take 1 tablet (100 mg total) by mouth daily before breakfast. 01/19/16  Yes Renato Shin, MD  cholecalciferol (VITAMIN D) 1000 UNITS tablet Take 1,000 Units by mouth daily.   Yes Historical Provider, MD  cloNIDine (CATAPRES) 0.1 MG tablet TAKE 1 TABLET TWICE DAILY (NEED MD APPOINTMENT) 07/28/15  Yes Biagio Borg, MD  furosemide (LASIX) 40 MG tablet Take 1 tablet (40 mg total) by mouth daily. 01/19/16  Yes Renato Shin, MD  glimepiride (AMARYL) 4 MG tablet Take 1 tablet (4 mg total) by mouth daily before breakfast. 11/06/15  Yes Renato Shin, MD  levothyroxine (SYNTHROID, LEVOTHROID) 50 MCG tablet Take 1 tablet (50 mcg total) by mouth daily. 12/08/15  Yes Renato Shin, MD  losartan (COZAAR) 100 MG tablet TAKE 1 TABLET EVERY DAY 10/07/15  Yes Biagio Borg, MD  metFORMIN (GLUCOPHAGE-XR) 500 MG 24 hr tablet Take 4 tablets (2,000 mg total) by mouth daily. 11/06/15  Yes Renato Shin, MD  metoprolol (TOPROL-XL) 200 MG 24 hr tablet Take 1 tablet (200 mg total) by mouth daily. 12/08/15  Yes Renato Shin, MD  nitroGLYCERIN (NITROSTAT) 0.4 MG SL tablet Place 1 tablet (0.4 mg total) under the tongue every 5 (five) minutes x 3 doses as needed for chest pain. 07/25/13  Yes Luke K Kilroy, PA-C  potassium chloride SA (K-DUR,KLOR-CON) 20 MEQ tablet Take 2 tablets (40 mEq total) by mouth daily. 11/16/15  Yes Biagio Borg, MD  pravastatin (PRAVACHOL) 40 MG tablet Take 1 tablet (40 mg total) by mouth daily. 12/08/15  Yes Renato Shin, MD  sitaGLIPtin (JANUVIA) 100 MG tablet Take 1 tablet (100 mg total) by mouth daily. 12/09/15  Yes Renato Shin, MD  warfarin (COUMADIN) 5 MG tablet TAKE AS DIRECTED BY COUMADIN CLINIC Patient taking differently: pt takes 7.5mg  on Mondays and Fridays - takes 5mg  all other days 10/07/15  Yes Deboraha Sprang, MD    Family  History Family History  Problem Relation Age of Onset  . Coronary artery disease Mother   . Hypertension Mother   . Diabetes Brother   . Coronary artery disease Other 85    male 1st degree relative, CABG in his 84's (father)    Social History Social History  Substance Use Topics  . Smoking status: Never Smoker  . Smokeless tobacco: Never Used  . Alcohol use No     Allergies   Insulin glargine   Review of Systems Review of Systems  Constitutional: Negative for appetite change.  HENT: Negative for congestion.   Eyes: Negative for visual disturbance.  Respiratory: Positive for shortness of breath. Negative for chest tightness.   Cardiovascular: Negative for chest pain.  Gastrointestinal: Negative for abdominal pain.  Musculoskeletal: Negative for back pain.  Skin: Negative for wound.  Neurological: Negative for light-headedness.     Physical Exam Updated  Vital Signs BP 139/84   Pulse 61   Temp 98 F (36.7 C) (Oral)   Resp 13   SpO2 92%   Physical Exam  Constitutional: He appears well-developed.  Patient is obese  HENT:  Head: Atraumatic.  Eyes: Pupils are equal, round, and reactive to light.  Neck: Neck supple.  Cardiovascular: Normal rate.   Aortic valve click  Pulmonary/Chest: Effort normal.  Abdominal: Soft.  Musculoskeletal: He exhibits no edema.  Neurological: He is alert.  Skin: Skin is warm. Capillary refill takes less than 2 seconds.     ED Treatments / Results  Labs (all labs ordered are listed, but only abnormal results are displayed) Labs Reviewed  PROTIME-INR - Abnormal; Notable for the following:       Result Value   Prothrombin Time 28.5 (*)    All other components within normal limits  CBC WITH DIFFERENTIAL/PLATELET - Abnormal; Notable for the following:    HCT 37.1 (*)    Platelets 138 (*)    All other components within normal limits  BRAIN NATRIURETIC PEPTIDE - Abnormal; Notable for the following:    B Natriuretic Peptide 408.3  (*)    All other components within normal limits  I-STAT CHEM 8, ED - Abnormal; Notable for the following:    Glucose, Bld 253 (*)    All other components within normal limits  PROTIME-INR  I-STAT TROPOININ, ED    EKG  EKG Interpretation  Date/Time:  Wednesday March 30 2016 07:45:16 EST Ventricular Rate:  70 PR Interval:  150 QRS Duration: 98 QT Interval:  440 QTC Calculation: 475 R Axis:   23 Text Interpretation:  Sinus rhythm with Premature atrial complexes with Abberant conduction Cannot rule out Anterior infarct , age undetermined Abnormal ECG Q waves in III old Confirmed by Alvino Chapel  MD, Kennieth Plotts [phone]) on 03/30/2016 7:48:24 AM       Radiology Dg Chest Portable 1 View  Result Date: 03/30/2016 CLINICAL DATA:  Shortness of breath beginning yesterday. EXAM: PORTABLE CHEST 1 VIEW COMPARISON:  CT chest 10/09/2014. Single-view of the chest 04/18/2014. FINDINGS: There is cardiomegaly and mild appearing interstitial edema. No consolidative process, pneumothorax or effusion. The patient is status post median sternotomy. IMPRESSION: Cardiomegaly mild appearing interstitial edema. Electronically Signed   By: Inge Rise M.D.   On: 03/30/2016 08:54    Procedures Procedures (including critical care time)  Medications Ordered in ED Medications  warfarin (COUMADIN) tablet 5 mg (not administered)  Warfarin - Pharmacist Dosing Inpatient (not administered)  furosemide (LASIX) injection 40 mg (40 mg Intravenous Given 03/30/16 1101)     Initial Impression / Assessment and Plan / ED Course  I have reviewed the triage vital signs and the nursing notes.  Pertinent labs & imaging results that were available during my care of the patient were reviewed by me and considered in my medical decision making (see chart for details).  Clinical Course     Patient with shortness of breath. History of heart problems. BNP is somewhat elevated next shows fluid. Admit to cardiology. Has had  worsening dyspnea over the last few days.  Final Clinical Impressions(s) / ED Diagnoses   Final diagnoses:  Acute on chronic congestive heart failure, unspecified congestive heart failure type [facility])    New Prescriptions New Prescriptions   No medications on file     Davonna Belling, MD 03/30/16 1544

## 2016-03-31 ENCOUNTER — Inpatient Hospital Stay (HOSPITAL_COMMUNITY): Payer: Medicare HMO

## 2016-03-31 DIAGNOSIS — I509 Heart failure, unspecified: Secondary | ICD-10-CM

## 2016-03-31 LAB — ECHOCARDIOGRAM COMPLETE
AO mean calculated velocity dopler: 202 cm/s
AOPV: 0.3 m/s
AOVTI: 61 cm
AV Area VTI index: 0.49 cm2/m2
AV Area mean vel: 1.19 cm2
AV VEL mean LVOT/AV: 0.29
AV pk vel: 302 cm/s
AVA: 1.36 cm2
AVAREAMEANVIN: 0.43 cm2/m2
AVAREAVTI: 1.22 cm2
AVG: 19 mmHg
AVPG: 36 mmHg
CHL CUP AV PEAK INDEX: 0.44
CHL CUP AV VEL: 1.36
CHL CUP MV DEC (S): 250
E decel time: 250 msec
EERAT: 5.45
FS: 25 % — AB (ref 28–44)
HEIGHTINCHES: 74 in
IV/PV OW: 0.9
LA ID, A-P, ES: 57 mm
LA vol index: 43 mL/m2
LA vol: 119 mL
LADIAMINDEX: 2.06 cm/m2
LAVOLA4C: 114 mL
LEFT ATRIUM END SYS DIAM: 57 mm
LV PW d: 13.7 mm — AB (ref 0.6–1.1)
LV TDI E'LATERAL: 8.27
LV TDI E'MEDIAL: 5.87
LVEEAVG: 5.45
LVEEMED: 5.45
LVELAT: 8.27 cm/s
LVOT VTI: 20 cm
LVOT area: 4.15 cm2
LVOT diameter: 23 mm
LVOT peak VTI: 0.33 cm
LVOT peak vel: 89.1 cm/s
LVOTSV: 83 mL
Lateral S' vel: 10 cm/s
MV pk E vel: 45.1 m/s
MVPKAVEL: 41.7 m/s
Valve area index: 0.49
WEIGHTICAEL: 4973.58 [oz_av]

## 2016-03-31 LAB — CBC
HEMATOCRIT: 38.9 % — AB (ref 39.0–52.0)
Hemoglobin: 13.6 g/dL (ref 13.0–17.0)
MCH: 27.4 pg (ref 26.0–34.0)
MCHC: 35 g/dL (ref 30.0–36.0)
MCV: 78.4 fL (ref 78.0–100.0)
PLATELETS: 144 10*3/uL — AB (ref 150–400)
RBC: 4.96 MIL/uL (ref 4.22–5.81)
RDW: 14.8 % (ref 11.5–15.5)
WBC: 5.3 10*3/uL (ref 4.0–10.5)

## 2016-03-31 LAB — BASIC METABOLIC PANEL
Anion gap: 8 (ref 5–15)
BUN: 12 mg/dL (ref 6–20)
CHLORIDE: 98 mmol/L — AB (ref 101–111)
CO2: 31 mmol/L (ref 22–32)
Calcium: 9.2 mg/dL (ref 8.9–10.3)
Creatinine, Ser: 1.21 mg/dL (ref 0.61–1.24)
Glucose, Bld: 270 mg/dL — ABNORMAL HIGH (ref 65–99)
POTASSIUM: 4.1 mmol/L (ref 3.5–5.1)
SODIUM: 137 mmol/L (ref 135–145)

## 2016-03-31 LAB — GLUCOSE, CAPILLARY
GLUCOSE-CAPILLARY: 181 mg/dL — AB (ref 65–99)
GLUCOSE-CAPILLARY: 212 mg/dL — AB (ref 65–99)
Glucose-Capillary: 239 mg/dL — ABNORMAL HIGH (ref 65–99)
Glucose-Capillary: 206 mg/dL — ABNORMAL HIGH (ref 65–99)

## 2016-03-31 LAB — TROPONIN I

## 2016-03-31 MED ORDER — WARFARIN SODIUM 5 MG PO TABS
5.0000 mg | ORAL_TABLET | Freq: Once | ORAL | Status: AC
  Administered 2016-03-31: 5 mg via ORAL
  Filled 2016-03-31: qty 1

## 2016-03-31 NOTE — Progress Notes (Signed)
  Echocardiogram 2D Echocardiogram has been performed.  [name] 03/31/2016, 3:38 PM

## 2016-03-31 NOTE — Progress Notes (Signed)
ANTICOAGULATION CONSULT NOTE - Initial Consult  Pharmacy Consult for Coumadin Indication: Afib  Allergies  Allergen Reactions  . Insulin Glargine Swelling    edema   Vital Signs: Temp: 98.3 F (36.8 C) (11/08 1957) Temp Source: Oral (11/08 1957) BP: 156/86 (11/08 1957) Pulse Rate: 66 (11/08 1957)  Labs:  Recent Labs  03/30/16 0805 03/30/16 [id number] 03/30/16 1632 03/30/16 1636 03/30/16 2221 03/31/16 0406  HGB 13.1 13.3  --   --   --  13.6  HCT 37.1* 39.0  --   --   --  38.9*  PLT 138*  --   --   --   --  144*  LABPROT 28.5*  --  26.8*  --   --   --   INR 2.62  --  2.42  --   --   --   CREATININE  --  1.10  --   --   --  1.21  TROPONINI  --   --   --  <0.03 <0.03 <0.03    Estimated Creatinine Clearance: 108 mL/min (by C-G formula based on SCr of 1.21 mg/dL).   Medical History: Past Medical History:  Diagnosis Date  . Acute on chronic diastolic CHF (congestive heart failure) ([place]) 03/30/2016  . Ascending aortic dissection ([place])    a. 2000 - with aortic valve involvement. S/p repair of aortic dissection with placement of mechanical AVR.  [name] CAD (coronary artery disease)   . Cholelithiasis 08/22/2013  . Diabetes mellitus   . Dyspnea   . Gross hematuria   . H/O cardiovascular stress test    a. Myoview 2011: walked for 59min, images with EF 39%, prominent inf scar, mild reversibility. b. Cardiac CT 06/2010: no CAD, only mild plaque in prox RCA. c. EF normal by echo 2014.  . H/O mechanical aortic valve replacement   . Hyperlipidemia   . Hypertension   . NEPHROLITHIASIS, HX OF   . Obesity   . PAF (paroxysmal atrial fibrillation) ([place])    a. H/o such, with recurrence of coarse afib/flutter during ER consult 03/2012.  [name] TRANSIENT ISCHEMIC ATTACK, HX OF    a. In 2004.  [name] Unspecified vitamin D deficiency     Assessment: 50 yo male with PMH of AAA s/p bentall, CAD s/p BMS to SVG to RCA, DM, HTN, HLD and PAF on Coumadin 5mg  daily exc for 7.5mg  on Mon/Fri PTA for Afib. INR in  admit therapeutic at 2.62. Hgb stable, plts low at 144. No s/s of bleed.  Goal of Therapy:  INR 2-3 Monitor platelets by anticoagulation protocol: Yes   Plan:  Give Coumadin 5mg  PO x 1 Monitor daily INR, CBC, s/s of bleed  Elenor Quinones, PharmD, [facility] Clinical Pharmacist Pager [phone] 03/31/2016 7:54 AM

## 2016-03-31 NOTE — Progress Notes (Addendum)
Patient Name: [name] Date of Encounter: 03/31/2016  Primary Cardiologist:  [facility] Problem List     Principal Problem:   Acute on chronic diastolic CHF (congestive heart failure) (H[facility]) Active Problems:   Diabetes ([place])   Essential hypertension   History of cardiovascular disorder   Ascending aortic dissection  in 2000- s/p Bentall and MDT tilting disc AVR   PAF (paroxysmal atrial fibrillation) (H[facility])   CAD of artery bypass graft- SVG-RCA BMS 2/271/5   Hypothyroidism     Subjective   Feels better but still has orthopnea.  Inpatient Medications    Scheduled Meds: . acarbose  25 mg Oral TID WC  . aspirin EC  81 mg Oral Daily  . canagliflozin  100 mg Oral QAC breakfast  . cloNIDine  0.1 mg Oral BID  . furosemide  80 mg Intravenous BID  . glimepiride  4 mg Oral QAC breakfast  . insulin aspart  0-15 Units Subcutaneous TID WC  . levothyroxine  50 mcg Oral QAC breakfast  . losartan  100 mg Oral Daily  . metoprolol  200 mg Oral Daily  . potassium chloride SA  40 mEq Oral Daily  . pravastatin  40 mg Oral Daily  . sodium chloride flush  3 mL Intravenous Q12H  . warfarin  5 mg Oral ONCE-1800  . Warfarin - Pharmacist Dosing Inpatient   Does not apply q1800   Continuous Infusions:  PRN Meds: sodium chloride, acetaminophen, ondansetron (ZOFRAN) IV, sodium chloride flush   Vital Signs    Vitals:   03/30/16 1400 03/30/16 1610 03/30/16 1957 03/31/16 0823  BP: 139/84 (!) 163/97 (!) 156/86 (!) 138/91  Pulse: 61 60 66 63  Resp: 13 18 18 18   Temp:  98.1 F (36.7 C) 98.3 F (36.8 C) 98.4 F (36.9 C)  TempSrc:  Oral Oral Oral  SpO2: 92% 92% 97% 93%  Weight:  (!) 310 lb 13.6 oz (141 kg)    Height:  6\' 2"  (1.88 m)      Intake/Output Summary (Last 24 hours) at 03/31/16 1045 Last data filed at 03/31/16 0952  Gross per 24 hour  Intake             1800 ml  Output             6000 ml  Net            -4200 ml   Filed Weights   03/30/16 1610  Weight:  (!) 310 lb 13.6 oz (141 kg)    Physical Exam    GEN: Obese, well developed, in no acute distress.  HEENT: Grossly normal.  Neck: Supple, no JVD, carotid bruits, or masses. Cardiac: RRR, 1/6 systolic murmur. No rub or gallop. No clubbing, cyanosis, edema.  Radials/DP/PT 2+ and equal bilaterally.  Respiratory:  Respirations regular and unlabored, clear to auscultation bilaterally. GI: Soft, nontender, nondistended, BS + x 4. MS: no deformity or atrophy. Skin: warm and dry, no rash. Neuro:  Strength and sensation are intact. Psych: AAOx3.  Normal affect.  Labs    CBC  Recent Labs  03/30/16 0805 03/30/16 0822 03/31/16 0406  WBC 5.5  --  5.3  NEUTROABS 3.4  --   --   HGB 13.1 13.3 13.6  HCT 37.1* 39.0 38.9*  MCV 78.3  --  78.4  PLT 138*  --  123456*   Basic Metabolic Panel  Recent Labs  03/30/16 0822 03/31/16 0406  NA 140 137  K 3.8 4.1  CL 102 98*  CO2  --  31  GLUCOSE 253* 270*  BUN 12 12  CREATININE 1.10 1.21  CALCIUM  --  9.2   Liver Function Tests No results for input(s): AST, ALT, ALKPHOS, BILITOT, PROT, ALBUMIN in the last 72 hours. No results for input(s): LIPASE, AMYLASE in the last 72 hours. Cardiac Enzymes  Recent Labs  03/30/16 1636 03/30/16 2221 03/31/16 0406  TROPONINI <0.03 <0.03 <0.03   BNP Invalid input(s): POCBNP D-Dimer No results for input(s): DDIMER in the last 72 hours. Hemoglobin A1C No results for input(s): HGBA1C in the last 72 hours. Fasting Lipid Panel No results for input(s): CHOL, HDL, LDLCALC, TRIG, CHOLHDL, LDLDIRECT in the last 72 hours. Thyroid Function Tests No results for input(s): TSH, T4TOTAL, T3FREE, THYROIDAB in the last 72 hours.  Invalid input(s): FREET3  Telemetry    NSR - Personally Reviewed  ECG    NSR with IVCD - Personally Reviewed  Radiology    Dg Chest Portable 1 View  Result Date: 03/30/2016 CLINICAL DATA:  Shortness of breath beginning yesterday. EXAM: PORTABLE CHEST 1 VIEW COMPARISON:  CT  chest 10/09/2014. Single-view of the chest 04/18/2014. FINDINGS: There is cardiomegaly and mild appearing interstitial edema. No consolidative process, pneumothorax or effusion. The patient is status post median sternotomy. IMPRESSION: Cardiomegaly mild appearing interstitial edema. Electronically Signed   By: Inge Rise M.D.   On: 03/30/2016 08:54    Cardiac Studies   None  Patient Profile     50 yo with prior Bentall for aortic dissection and RCA SVG. Now has RCA stent. Developed CHF over weeks.  Assessment & Plan    1. Acute on chronic diastolic heart failure improving with diuresis. Continue IV lasix. Recent echo with preserved systolic function. 2. Mechanical aortic valve prosthesis, s/p Bentall. 3. CAD with prior RCA SVG and subsequent RCA stent. No plan for ischemic evaluation at this time. 4. PAF, no recurrence. Continue coumadin.  Signed, Sinclair Grooms, MD  03/31/2016, 10:45 AM

## 2016-03-31 NOTE — Care Management Note (Signed)
Case Management Note  Patient Details  Name: [name] MRN: [id number] Date of Birth: 1965/06/29  Subjective/Objective:     Admitted with CHF               Action/Plan: Patient is independent prior to admission; PCP Dr Cathlean Cower; has private insurance with [employer] [facility] with prescription drug coverage. CM will continue to follow for DCP.  Expected Discharge Date:   possible 04/03/2016               Expected Discharge Plan:  Home/Self Care  Discharge planning Services  CM Consult  Status of Service:  In process, will continue to follow  Sherrilyn Rist [id number] 03/31/2016, 10:16 AM

## 2016-03-31 NOTE — Progress Notes (Signed)
Heart Failure Navigator Consult Note  Presentation: per H &P: [name] is a 50 yo male with PMH of AAA s/p bentall, CAD s/p BMS to SVG to RCA, DM, HTN, HLD and PAF. He has a h/o spontaneous ascending aortic dissection with aortic valve involvement status post mechanical AVR with ascending aorta conduit in 2000, and is followed by CVTS.   He underwent LHC in 2/15 showing patent left main, LAD, circumflex and an occluded right coronary artery. The saphenous vein graft to the right coronary artery had an ostial 90% lesion. The patient had PCI of the saphenous vein graft to the right coronary artery, with BMS placement. He was placed on plavix, along with coumadin for one month, and then resumed on low dose asa and coumadin. During that admission his BNP was noted to be elevated and he was placed on lasix.   Reports he has been doing well, in his usual state of health. Works out 4-5 days a week at [employer]. Denies any anginal symptoms or dyspnea with exercise routine. States 2 days ago he began to develop dyspnea on exertion, and orthopnea. He noticed he was unable to lay down at night, normally uses 2 pillows at night, but had been sleeping propped up at night. Also noticed lower extremity edema. States he has been compliant with his home medications, including his coumadin. He sticks to a very strict diet with mostly protein, and generally avoids fatty, greasy foods. This morning he was getting out of the shower and felt very short of breath, prompting him to come to the ED.   Labs showed an elevated BNP 408, electrolytes were stable, INR 2.6, CXR with edema. EKg showed SR with q waves in lead III, aVF  Past Medical History:  Diagnosis Date  . Acute on chronic diastolic CHF (congestive heart failure) ([place]) 03/30/2016  . Ascending aortic dissection ([place])    a. 2000 - with aortic valve involvement. S/p repair of aortic dissection with placement of mechanical AVR.  [name] CAD (coronary artery disease)    . Cholelithiasis 08/22/2013  . Diabetes mellitus   . Dyspnea   . Gross hematuria   . H/O cardiovascular stress test    a. Myoview 2011: walked for 64min, images with EF 39%, prominent inf scar, mild reversibility. b. Cardiac CT 06/2010: no CAD, only mild plaque in prox RCA. c. EF normal by echo 2014.  . H/O mechanical aortic valve replacement   . Hyperlipidemia   . Hypertension   . NEPHROLITHIASIS, HX OF   . Obesity   . PAF (paroxysmal atrial fibrillation) ([place])    a. H/o such, with recurrence of coarse afib/flutter during ER consult 03/2012.  [name] TRANSIENT ISCHEMIC ATTACK, HX OF    a. In 2004.  [name] Unspecified vitamin D deficiency     Social History   Social History  . Marital status: Married    Spouse name: N/A  . Number of children: 4  . Years of education: N/A   Occupational History  . DISABLED Unemployed   Social History Main Topics  . Smoking status: Never Smoker  . Smokeless tobacco: Never Used  . Alcohol use No  . Drug use: No  . Sexual activity: Not Asked   Other Topics Concern  . None   Social History Narrative  . None    ECHO:Study Conclusions--07/14/15  - Left ventricle: The cavity size was normal. Wall thickness was   increased in a pattern of moderate LVH. Systolic function was  normal. The estimated ejection fraction was in the range of 55%   to 60%. Wall motion was normal; there were no regional wall   motion abnormalities. Features are consistent with a pseudonormal   left ventricular filling pattern, with concomitant abnormal   relaxation and increased filling pressure (grade 2 diastolic   dysfunction). - Aortic valve: A mechanical prosthesis was present. There was   trivial regurgitation. - Mitral valve: There was mild regurgitation.  Impressions:  - Normal LV function; moderate LVH; grade 2 diastolic dysfunction;   prosthetic aortic valve with trace AI; mean gradient of 27 mmHg   higher than expected for mechanical  valve.  Transthoracic echocardiography.  2D, spectral Doppler, and color Doppler.  Birthdate:  Patient birthdate: [birth year].  Age:  Patient is 50 yr old.  Sex:  Gender: male.    BMI: 41.5 kg/m^2.  Blood pressure:     134/86  Patient status:  Outpatient.  Study date: Study date: 07/14/2015. Study time: 08:43 AM.  Location:  [facility]  BNP    Component Value Date/Time   BNP 408.3 (H) 03/30/2016 0805    ProBNP    Component Value Date/Time   PROBNP 616.8 (H) 04/18/2014 1222     Education Assessment and Provision:  Detailed education and instructions provided on heart failure disease management including the following:  Signs and symptoms of Heart Failure When to call the physician Importance of daily weights Low sodium diet Fluid restriction Medication management Anticipated future follow-up appointments  Patient education given on each of the above topics.  Patient acknowledges understanding and acceptance of all instructions.  I spoke with [name] regarding his HF and current hospitalization.  He does tell me that he has been told in the past that he has HF.  He has a scale at home and weighs daily.  I reviewed the importance of the weights and how they relate to the signs and symptoms of HF.   I reviewed a low sodium diet and high sodium foods to avoid.  He admits that he adds table salt to some raw vegetables that he eats and I have discouraged this habit.  He does describe a low sodium diet from dietary recall apart from using the added salt.   He has been dieting recently to lose weight and was discouraged because he was gaining.  He denies any issues getting or taking prescribed medications.  He follows with [facility].  Education Materials:  "Living Better With Heart Failure" Booklet, Daily Weight Tracker Tool    High Risk Criteria for Readmission and/or Poor Patient Outcomes:   EF <30%- No 55-60% with grade 2 dias dys  2 or more admissions in 6  months- No  Difficult social situation- No  Demonstrates medication noncompliance- Denies   Barriers of Care:  Knowledge and compliance  Discharge Planning:   Plans to return to home with his wife in [place].

## 2016-04-01 DIAGNOSIS — I25810 Atherosclerosis of coronary artery bypass graft(s) without angina pectoris: Secondary | ICD-10-CM

## 2016-04-01 DIAGNOSIS — Z952 Presence of prosthetic heart valve: Secondary | ICD-10-CM

## 2016-04-01 DIAGNOSIS — Z7901 Long term (current) use of anticoagulants: Secondary | ICD-10-CM

## 2016-04-01 LAB — CBC
HEMATOCRIT: 40.8 % (ref 39.0–52.0)
HEMOGLOBIN: 14.6 g/dL (ref 13.0–17.0)
MCH: 27.8 pg (ref 26.0–34.0)
MCHC: 35.8 g/dL (ref 30.0–36.0)
MCV: 77.7 fL — ABNORMAL LOW (ref 78.0–100.0)
Platelets: 164 10*3/uL (ref 150–400)
RBC: 5.25 MIL/uL (ref 4.22–5.81)
RDW: 14.8 % (ref 11.5–15.5)
WBC: 6.1 10*3/uL (ref 4.0–10.5)

## 2016-04-01 LAB — GLUCOSE, CAPILLARY
GLUCOSE-CAPILLARY: 247 mg/dL — AB (ref 65–99)
GLUCOSE-CAPILLARY: 154 mg/dL — AB (ref 65–99)
Glucose-Capillary: 207 mg/dL — ABNORMAL HIGH (ref 65–99)
Glucose-Capillary: 184 mg/dL — ABNORMAL HIGH (ref 65–99)

## 2016-04-01 LAB — BASIC METABOLIC PANEL
ANION GAP: 10 (ref 5–15)
BUN: 15 mg/dL (ref 6–20)
CALCIUM: 9.2 mg/dL (ref 8.9–10.3)
CO2: 26 mmol/L (ref 22–32)
Chloride: 101 mmol/L (ref 101–111)
Creatinine, Ser: 1.21 mg/dL (ref 0.61–1.24)
GLUCOSE: 180 mg/dL — AB (ref 65–99)
POTASSIUM: 3.4 mmol/L — AB (ref 3.5–5.1)
Sodium: 137 mmol/L (ref 135–145)

## 2016-04-01 LAB — PROTIME-INR
INR: 2.35
PROTHROMBIN TIME: 26.2 s — AB (ref 11.4–15.2)

## 2016-04-01 MED ORDER — POTASSIUM CHLORIDE CRYS ER 20 MEQ PO TBCR: 40.0000 meq | EXTENDED_RELEASE_TABLET | Freq: Once | ORAL | Status: DC

## 2016-04-01 MED ORDER — WARFARIN SODIUM 7.5 MG PO TABS
7.5000 mg | ORAL_TABLET | Freq: Once | ORAL | Status: AC
  Administered 2016-04-01: 7.5 mg via ORAL
  Filled 2016-04-01: qty 1

## 2016-04-01 MED ORDER — POTASSIUM CHLORIDE CRYS ER 20 MEQ PO TBCR
40.0000 meq | EXTENDED_RELEASE_TABLET | Freq: Two times a day (BID) | ORAL | Status: AC
  Administered 2016-04-01 (×2): 40 meq via ORAL
  Filled 2016-04-01 (×2): qty 2

## 2016-04-01 NOTE — Progress Notes (Signed)
Pt refused bed alarm on. Will continue to do hourly rounding. 

## 2016-04-01 NOTE — Progress Notes (Signed)
Patient Name: [name] Date of Encounter: 04/01/2016  Primary Cardiologist:  [facility] Problem List     Principal Problem:   Acute on chronic diastolic CHF (congestive heart failure) (HCC) Active Problems:   Diabetes ([place])   Essential hypertension   History of cardiovascular disorder   Ascending aortic dissection  in 2000- s/p Bentall and MDT tilting disc AVR   PAF (paroxysmal atrial fibrillation) (HCC)   CAD of artery bypass graft- SVG-RCA BMS 2/271/5   Hypothyroidism     Subjective   Frequent urination. Breathing is better. Legs are less swollen. Asking to go home.  Inpatient Medications    Scheduled Meds: . acarbose  25 mg Oral TID WC  . aspirin EC  81 mg Oral Daily  . canagliflozin  100 mg Oral QAC breakfast  . cloNIDine  0.1 mg Oral BID  . furosemide  80 mg Intravenous BID  . glimepiride  4 mg Oral QAC breakfast  . insulin aspart  0-15 Units Subcutaneous TID WC  . levothyroxine  50 mcg Oral QAC breakfast  . losartan  100 mg Oral Daily  . metoprolol  200 mg Oral Daily  . potassium chloride SA  40 mEq Oral Daily  . pravastatin  40 mg Oral Daily  . sodium chloride flush  3 mL Intravenous Q12H  . warfarin  7.5 mg Oral ONCE-1800  . Warfarin - Pharmacist Dosing Inpatient   Does not apply q1800   Continuous Infusions:  PRN Meds: sodium chloride, acetaminophen, ondansetron (ZOFRAN) IV, sodium chloride flush   Vital Signs    Vitals:   03/31/16 1208 03/31/16 1700 03/31/16 1941 04/01/16 0334  BP: 124/83 132/84 128/83 126/85  Pulse: 64 60 60 63  Resp: 18 18 18 18   Temp: 97.7 F (36.5 C) 98 F (36.7 C) 97.8 F (36.6 C) 97.7 F (36.5 C)  TempSrc: Oral Oral Oral Oral  SpO2: 97% 100% 100% 98%  Weight:    (!) 301 lb (136.5 kg)  Height:        Intake/Output Summary (Last 24 hours) at 04/01/16 0954 Last data filed at 04/01/16 0941  Gross per 24 hour  Intake              830 ml  Output             3825 ml  Net            -2995 ml    Filed Weights   03/30/16 1610 04/01/16 0334  Weight: (!) 310 lb 13.6 oz (141 kg) (!) 301 lb (136.5 kg)    Physical Exam    GEN: Obese, well developed, in no acute distress.  HEENT: Grossly normal.  Neck: Supple, no JVD, carotid bruits, or masses. Cardiac: RRR, 1/6 systolic murmur. No rub or gallop. No clubbing, cyanosis. 2+ edema present yesterday has resolved.  Radials/DP/PT 2+ and equal bilaterally.  Respiratory:  Respirations regular and unlabored, clear to auscultation bilaterally. GI: Soft, nontender, nondistended, BS + x 4. MS: no deformity or atrophy. Skin: warm and dry, no rash. Neuro:  Strength and sensation are intact. Psych: AAOx3.  Normal affect.  Labs    CBC  Recent Labs  03/30/16 0805  03/31/16 0406 04/01/16 0313  WBC 5.5  --  5.3 6.1  NEUTROABS 3.4  --   --   --   HGB 13.1  < > 13.6 14.6  HCT 37.1*  < > 38.9* 40.8  MCV 78.3  --  78.4 77.7*  PLT 138*  --  144* 164  < > = values in this interval not displayed. Basic Metabolic Panel  Recent Labs  03/31/16 0406 04/01/16 0313  NA 137 137  K 4.1 3.4*  CL 98* 101  CO2 31 26  GLUCOSE 270* 180*  BUN 12 15  CREATININE 1.21 1.21  CALCIUM 9.2 9.2   Liver Function Tests No results for input(s): AST, ALT, ALKPHOS, BILITOT, PROT, ALBUMIN in the last 72 hours. No results for input(s): LIPASE, AMYLASE in the last 72 hours. Cardiac Enzymes  Recent Labs  03/30/16 1636 03/30/16 2221 03/31/16 0406  TROPONINI <0.03 <0.03 <0.03   BNP Invalid input(s): POCBNP D-Dimer No results for input(s): DDIMER in the last 72 hours. Hemoglobin A1C No results for input(s): HGBA1C in the last 72 hours. Fasting Lipid Panel No results for input(s): CHOL, HDL, LDLCALC, TRIG, CHOLHDL, LDLDIRECT in the last 72 hours. Thyroid Function Tests No results for input(s): TSH, T4TOTAL, T3FREE, THYROIDAB in the last 72 hours.  Invalid input(s): FREET3  Telemetry    NSR - Personally Reviewed  ECG    NSR with IVCD -  Personally Reviewed  Radiology    No results found.  Cardiac Studies   Echocardiogram 03/31/16:  Study Conclusions  - Left ventricle: The cavity size was mildly dilated. There was   mild concentric hypertrophy. Systolic function was normal. The   estimated ejection fraction was in the range of 55% to 60%. Wall   motion was normal; there were no regional wall motion   abnormalities. Features are consistent with a pseudonormal left   ventricular filling pattern, with concomitant abnormal relaxation   and increased filling pressure (grade 2 diastolic dysfunction). - Aortic valve: A mechanical prosthesis was present. Mean gradient   (S): 19 mm Hg. Valve area (VTI): 1.36 cm^2. Valve area (Vmax):   1.22 cm^2. Valve area (Vmean): 1.19 cm^2. - Left atrium: The atrium was moderately dilated.  Patient Profile     50 yo with prior Bentall With mechanical aortic valve for aortic dissection and RCA SVG. Now has SVG RCA bare-metal stent placed in 2015 during an episode of unstable angina. Developed CHF over the past 6-8 weeks. The patient advocates medication compliance. No chest discomfort.   Assessment & Plan    1. Acute on chronic diastolic heart failure improving with diuresis. Weight is down from 310 pounds to 301 pounds. I and O- 7.2 L since admission. Clinical status is improving. He was not on furosemide as an outpatient. He was previously on therapy but does not understand how the medication was discontinued.  Repeat echo demonstrates persistent normal systolic function with EF 60% . Dyspnea with the patient, his usual outpatient dry weight is 291 pounds. Plan to continue IV diuresis. Hopefully he will be ready for discharge by Sunday. Kidney function is tolerating diuresis without difficulty. Prior to admission he was on 40 mg orally daily. He will likely need intensification perhaps to 80 mg daily once IV Lasix was discontinued.  2. Mechanical aortic valve prosthesis, s/p Bentall. We have  continued Coumadin since admission.  3. CAD with prior RCA SVG and subsequent SVG  RCA stent. No plan for ischemic evaluation at this time. 4. PAF, no recurrence. Continue coumadin. 5. Hypokalemia. Plan increase supplementation today.  Signed, Sinclair Grooms, MD  04/01/2016, 9:54 AM

## 2016-04-01 NOTE — Progress Notes (Signed)
ANTICOAGULATION CONSULT NOTE - Initial Consult  Pharmacy Consult for Coumadin Indication: Afib  Allergies  Allergen Reactions  . Insulin Glargine Swelling    edema   Vital Signs: Temp: 97.7 F (36.5 C) (11/10 0334) Temp Source: Oral (11/10 0334) BP: 126/85 (11/10 0334) Pulse Rate: 63 (11/10 0334)  Labs:  Recent Labs  03/30/16 0805 03/30/16 [id number] 03/30/16 1632 03/30/16 1636 03/30/16 2221 03/31/16 0406 04/01/16 0313  HGB 13.1 13.3  --   --   --  13.6 14.6  HCT 37.1* 39.0  --   --   --  38.9* 40.8  PLT 138*  --   --   --   --  144* 164  LABPROT 28.5*  --  26.8*  --   --   --  26.2*  INR 2.62  --  2.42  --   --   --  2.35  CREATININE  --  1.10  --   --   --  1.21 1.21  TROPONINI  --   --   --  <0.03 <0.03 <0.03  --    Estimated Creatinine Clearance: 106.1 mL/min (by C-G formula based on SCr of 1.21 mg/dL).  Medical History: Past Medical History:  Diagnosis Date  . Acute on chronic diastolic CHF (congestive heart failure) ([place]) 03/30/2016  . Ascending aortic dissection ([place])    a. 2000 - with aortic valve involvement. S/p repair of aortic dissection with placement of mechanical AVR.  [name] CAD (coronary artery disease)   . Cholelithiasis 08/22/2013  . Diabetes mellitus   . Dyspnea   . Gross hematuria   . H/O cardiovascular stress test    a. Myoview 2011: walked for 78min, images with EF 39%, prominent inf scar, mild reversibility. b. Cardiac CT 06/2010: no CAD, only mild plaque in prox RCA. c. EF normal by echo 2014.  . H/O mechanical aortic valve replacement   . Hyperlipidemia   . Hypertension   . NEPHROLITHIASIS, HX OF   . Obesity   . PAF (paroxysmal atrial fibrillation) ([place])    a. H/o such, with recurrence of coarse afib/flutter during ER consult 03/2012.  [name] TRANSIENT ISCHEMIC ATTACK, HX OF    a. In 2004.  [name] Unspecified vitamin D deficiency    Assessment: 50 yo male with PMH of AAA s/p bentall, CAD s/p BMS to SVG to RCA, DM, HTN, HLD and PAF on Coumadin 5mg  daily  exc for 7.5mg  on Mon/Fri PTA for Afib. INR in admit therapeutic at 2.62. Hgb stable, plts stable. No s/s of bleed.  INR today therapeutic: 2.35  Goal of Therapy:  INR 2-3 Monitor platelets by anticoagulation protocol: Yes   Plan:  Give Coumadin 7.5mg  PO x 1 Monitor daily INR, CBC, s/s of bleed  Georga Bora, PharmD Clinical Pharmacist Pager: [phone] 04/01/2016 7:43 AM

## 2016-04-01 NOTE — Progress Notes (Signed)
Pt. Is alert and oriented in bed with head elevated with 2 pillows. Lungs diminished currently on IV lasix. Monitoring Blood sugars

## 2016-04-02 LAB — GLUCOSE, CAPILLARY
Glucose-Capillary: 166 mg/dL — ABNORMAL HIGH (ref 65–99)
Glucose-Capillary: 199 mg/dL — ABNORMAL HIGH (ref 65–99)
Glucose-Capillary: 145 mg/dL — ABNORMAL HIGH (ref 65–99)
Glucose-Capillary: 162 mg/dL — ABNORMAL HIGH (ref 65–99)

## 2016-04-02 LAB — CBC
HCT: 42.2 % (ref 39.0–52.0)
Hemoglobin: 15.1 g/dL (ref 13.0–17.0)
MCH: 27.8 pg (ref 26.0–34.0)
MCHC: 35.8 g/dL (ref 30.0–36.0)
MCV: 77.7 fL — ABNORMAL LOW (ref 78.0–100.0)
PLATELETS: 165 10*3/uL (ref 150–400)
RBC: 5.43 MIL/uL (ref 4.22–5.81)
RDW: 14.8 % (ref 11.5–15.5)
WBC: 6.5 10*3/uL (ref 4.0–10.5)

## 2016-04-02 LAB — BASIC METABOLIC PANEL
ANION GAP: 11 (ref 5–15)
BUN: 18 mg/dL (ref 6–20)
CALCIUM: 9.2 mg/dL (ref 8.9–10.3)
CO2: 27 mmol/L (ref 22–32)
CREATININE: 1.35 mg/dL — AB (ref 0.61–1.24)
Chloride: 101 mmol/L (ref 101–111)
GFR calc Af Amer: >60 mL/min (ref >60)
GFR, EST NON AFRICAN AMERICAN: 59 mL/min — AB (ref >60)
GLUCOSE: 165 mg/dL — AB (ref 65–99)
Potassium: 3.8 mmol/L (ref 3.5–5.1)
Sodium: 139 mmol/L (ref 135–145)

## 2016-04-02 LAB — PROTIME-INR
INR: 2.34
PROTHROMBIN TIME: 26 s — AB (ref 11.4–15.2)

## 2016-04-02 MED ORDER — MAGNESIUM HYDROXIDE 400 MG/5ML PO SUSP
30.0000 mL | Freq: Every day | ORAL | Status: DC | PRN
  Administered 2016-04-02: 30 mL via ORAL
  Filled 2016-04-02: qty 30

## 2016-04-02 MED ORDER — WARFARIN SODIUM 5 MG PO TABS
5.0000 mg | ORAL_TABLET | Freq: Once | ORAL | Status: AC
  Administered 2016-04-02: 5 mg via ORAL
  Filled 2016-04-02: qty 1

## 2016-04-02 NOTE — Progress Notes (Signed)
ANTICOAGULATION CONSULT NOTE - Initial Consult  Pharmacy Consult for Coumadin Indication: Afib  Allergies  Allergen Reactions  . Insulin Glargine Swelling    edema   Vital Signs: Temp: 97.8 F (36.6 C) (11/11 0602) Temp Source: Oral (11/11 0602) BP: 121/78 (11/11 0932) Pulse Rate: 65 (11/11 0932)  Labs:  Recent Labs  03/30/16 1632 03/30/16 1636 03/30/16 2221  03/31/16 0406 04/01/16 0313 04/02/16 0311  HGB  --   --   --   < > 13.6 14.6 15.1  HCT  --   --   --   --  38.9* 40.8 42.2  PLT  --   --   --   --  144* 164 165  LABPROT 26.8*  --   --   --   --  26.2* 26.0*  INR 2.42  --   --   --   --  2.35 2.34  CREATININE  --   --   --   --  1.21 1.21 1.35*  TROPONINI  --  <0.03 <0.03  --  <0.03  --   --   < > = values in this interval not displayed. Estimated Creatinine Clearance: 94.9 mL/min (by C-G formula based on SCr of 1.35 mg/dL (H)).  Assessment:  50 yo male with PMH of AAA s/p bentall, CAD s/p BMS to SVG to RCA, DM, HTN, HLD and PAF who presented with c/o dyspnea and orthopnea for 2 days.  Anticoag: On Coumadin 5mg  daily exc for 7.5mg  on Mon/Fri PTA for Afib. INR in admit therapeutic at 2.62.  INR 2.34  Nephro: SCr 1.35  Heme/Onc: H&H 15.1/42.2, Plt 165  Goal of Therapy:  INR 2-3 Monitor platelets by anticoagulation protocol: Yes   Plan:  Coumadin 5mg  PO x 1 Monitor daily INR, CBC, s/s of bleed  Levester Fresh, PharmD, BCPS, [facility] Clinical Pharmacist Pager [phone] 04/02/2016 12:28 PM

## 2016-04-02 NOTE — Progress Notes (Signed)
Patient Name: [name] Date of Encounter: 04/02/2016  Primary Cardiologist:  [facility] Problem List     Principal Problem:   Acute on chronic diastolic CHF (congestive heart failure) (HCC) Active Problems:   Diabetes ([place])   Essential hypertension   History of cardiovascular disorder   Ascending aortic dissection  in 2000- s/p Bentall and MDT tilting disc AVR   PAF (paroxysmal atrial fibrillation) (HCC)   CAD of artery bypass graft- SVG-RCA BMS 2/271/5   Hypothyroidism     Subjective   Couldn't sleep last night PND/Orthopnea   Inpatient Medications    Scheduled Meds: . acarbose  25 mg Oral TID WC  . aspirin EC  81 mg Oral Daily  . canagliflozin  100 mg Oral QAC breakfast  . cloNIDine  0.1 mg Oral BID  . furosemide  80 mg Intravenous BID  . glimepiride  4 mg Oral QAC breakfast  . insulin aspart  0-15 Units Subcutaneous TID WC  . levothyroxine  50 mcg Oral QAC breakfast  . losartan  100 mg Oral Daily  . metoprolol  200 mg Oral Daily  . potassium chloride SA  40 mEq Oral Daily  . pravastatin  40 mg Oral Daily  . sodium chloride flush  3 mL Intravenous Q12H  . Warfarin - Pharmacist Dosing Inpatient   Does not apply q1800   Continuous Infusions:  PRN Meds: sodium chloride, acetaminophen, magnesium hydroxide, ondansetron (ZOFRAN) IV, sodium chloride flush   Vital Signs    Vitals:   04/01/16 1155 04/01/16 2121 04/02/16 0602 04/02/16 0932  BP: 121/84 113/69 128/81 121/78  Pulse: 63 (!) 56 65 65  Resp: 18 18 18    Temp: 97.5 F (36.4 C) 97.8 F (36.6 C) 97.8 F (36.6 C)   TempSrc: Oral Oral Oral   SpO2: 99% 97% 99% 96%  Weight:   135.7 kg (299 lb 1.6 oz)   Height:        Intake/Output Summary (Last 24 hours) at 04/02/16 1149 Last data filed at 04/02/16 1030  Gross per 24 hour  Intake             1480 ml  Output             3450 ml  Net            -1970 ml   Filed Weights   03/30/16 1610 04/01/16 0334 04/02/16 0602  Weight: (!) 141  kg (310 lb 13.6 oz) (!) 136.5 kg (301 lb) 135.7 kg (299 lb 1.6 oz)    Physical Exam    GEN: Obese, well developed, in no acute distress.  HEENT: Grossly normal.  Neck: Supple, no JVD, carotid bruits, or masses. Cardiac: RRR, 1/6 systolic murmur. No rub or gallop. No clubbing, cyanosis. 2+ edema has resolved.  Radials/DP/PT 2+ and equal bilaterally.  Respiratory:  Respirations regular and unlabored, clear to auscultation bilaterally. GI: Soft, nontender, nondistended, BS + x 4. MS: no deformity or atrophy. Skin: warm and dry, no rash. Neuro:  Strength and sensation are intact. Psych: AAOx3.  Normal affect.  Labs    CBC  Recent Labs  04/01/16 0313 04/02/16 0311  WBC 6.1 6.5  HGB 14.6 15.1  HCT 40.8 42.2  MCV 77.7* 77.7*  PLT 164 [id number]   Basic Metabolic Panel  Recent Labs  04/01/16 0313 04/02/16 0311  NA 137 139  K 3.4* 3.8  CL 101 101  CO2 26 27  GLUCOSE 180* 165*  BUN 15 18  CREATININE 1.21 1.35*  CALCIUM 9.2 9.2   Cardiac Enzymes  Recent Labs  03/30/16 1636 03/30/16 2221 03/31/16 0406  TROPONINI <0.03 <0.03 <0.03     Telemetry    NSR 04/02/2016  - Personally Reviewed  ECG    NSR with IVCD - Personally Reviewed  Radiology    CXR 03/30/16 CE mild interstitial edema  Cardiac Studies   Echocardiogram 03/31/16:  Study Conclusions  - Left ventricle: The cavity size was mildly dilated. There was   mild concentric hypertrophy. Systolic function was normal. The   estimated ejection fraction was in the range of 55% to 60%. Wall   motion was normal; there were no regional wall motion   abnormalities. Features are consistent with a pseudonormal left   ventricular filling pattern, with concomitant abnormal relaxation   and increased filling pressure (grade 2 diastolic dysfunction). - Aortic valve: A mechanical prosthesis was present. Mean gradient   (S): 19 mm Hg. Valve area (VTI): 1.36 cm^2. Valve area (Vmax):   1.22 cm^2. Valve area (Vmean): 1.19  cm^2. - Left atrium: The atrium was moderately dilated.  Patient Profile     50 yo with prior Bentall With mechanical aortic valve for aortic dissection and RCA SVG. Now has SVG RCA bare-metal stent placed in 2015 during an episode of unstable angina. Developed CHF over the past 6-8 weeks. The patient advocates medication compliance. No chest discomfort.   Assessment & Plan    1. Acute on chronic diastolic heart failure improving with diuresis. Weight is down from 310 pounds . I and O- 9.2 L since admission. Clinical status is improving. BUN/CR starting to bump. Still with PND/orthopnea Edema gone One more day Iv lasix then change to PO.  2. Mechanical aortic valve prosthesis, s/p Bentall. We have continued Coumadin since admission. INR Rx 3. CAD with prior RCA SVG and subsequent SVG  RCA stent. No plan for ischemic evaluation at this time. 4. PAF, no recurrence. Continue coumadin. 5. Hypokalemia. Plan increase supplementation today.  Signed, Jenkins Rouge, MD  04/02/2016, 11:49 AM  Patient ID: [name], male   DOB: Jan 17, 1966, 50 y.o.   MRN: [id number]

## 2016-04-03 LAB — CBC
HEMATOCRIT: 42.9 % (ref 39.0–52.0)
HEMOGLOBIN: 15.1 g/dL (ref 13.0–17.0)
MCH: 27.6 pg (ref 26.0–34.0)
MCHC: 35.2 g/dL (ref 30.0–36.0)
MCV: 78.3 fL (ref 78.0–100.0)
Platelets: 175 10*3/uL (ref 150–400)
RBC: 5.48 MIL/uL (ref 4.22–5.81)
RDW: 14.8 % (ref 11.5–15.5)
WBC: 6.8 10*3/uL (ref 4.0–10.5)

## 2016-04-03 LAB — PROTIME-INR
INR: 2.58
Prothrombin Time: 28.2 seconds — ABNORMAL HIGH (ref 11.4–15.2)

## 2016-04-03 LAB — BASIC METABOLIC PANEL
ANION GAP: 9 (ref 5–15)
BUN: 22 mg/dL — ABNORMAL HIGH (ref 6–20)
CHLORIDE: 101 mmol/L (ref 101–111)
CO2: 28 mmol/L (ref 22–32)
Calcium: 9.2 mg/dL (ref 8.9–10.3)
Creatinine, Ser: 1.49 mg/dL — ABNORMAL HIGH (ref 0.61–1.24)
GFR calc non Af Amer: 53 mL/min — ABNORMAL LOW (ref >60)
Glucose, Bld: 182 mg/dL — ABNORMAL HIGH (ref 65–99)
POTASSIUM: 3.9 mmol/L (ref 3.5–5.1)
SODIUM: 138 mmol/L (ref 135–145)

## 2016-04-03 LAB — GLUCOSE, CAPILLARY
GLUCOSE-CAPILLARY: 172 mg/dL — AB (ref 65–99)
GLUCOSE-CAPILLARY: 199 mg/dL — AB (ref 65–99)
GLUCOSE-CAPILLARY: 202 mg/dL — AB (ref 65–99)
Glucose-Capillary: 186 mg/dL — ABNORMAL HIGH (ref 65–99)

## 2016-04-03 MED ORDER — WARFARIN SODIUM 5 MG PO TABS
5.0000 mg | ORAL_TABLET | Freq: Once | ORAL | Status: AC
  Administered 2016-04-03: 5 mg via ORAL
  Filled 2016-04-03: qty 1

## 2016-04-03 NOTE — Progress Notes (Signed)
Central tele called report to RN that patient had V fib on monitor, immediately went into room patient sitting up in chair reading the paper.  Three RN's looked at monitor strip, questionable V tach however present in only 3 leads.  Patient denies any chest pain or other symptoms.  Cardiology notified, immediately came up and looked at strip and determined this was artifact.

## 2016-04-03 NOTE — Progress Notes (Signed)
Patient Name: [name] Date of Encounter: 04/03/2016  Primary Cardiologist:  [facility] Problem List     Principal Problem:   Acute on chronic diastolic CHF (congestive heart failure) (HCC) Active Problems:   Diabetes ([place])   Essential hypertension   History of cardiovascular disorder   Ascending aortic dissection  in 2000- s/p Bentall and MDT tilting disc AVR   PAF (paroxysmal atrial fibrillation) (HCC)   CAD of artery bypass graft- SVG-RCA BMS 2/271/5   Hypothyroidism     Subjective   Continues to diurese. Weight down another 3 pounds. 14 pounds total. Creatinine continues to climb.   Nurses called for VT but tele shows artifact.   Inpatient Medications    Scheduled Meds: . acarbose  25 mg Oral TID WC  . aspirin EC  81 mg Oral Daily  . canagliflozin  100 mg Oral QAC breakfast  . cloNIDine  0.1 mg Oral BID  . furosemide  80 mg Intravenous BID  . glimepiride  4 mg Oral QAC breakfast  . insulin aspart  0-15 Units Subcutaneous TID WC  . levothyroxine  50 mcg Oral QAC breakfast  . losartan  100 mg Oral Daily  . metoprolol  200 mg Oral Daily  . potassium chloride SA  40 mEq Oral Daily  . pravastatin  40 mg Oral Daily  . sodium chloride flush  3 mL Intravenous Q12H  . warfarin  5 mg Oral ONCE-1800  . Warfarin - Pharmacist Dosing Inpatient   Does not apply q1800   Continuous Infusions:  PRN Meds: sodium chloride, acetaminophen, magnesium hydroxide, ondansetron (ZOFRAN) IV, sodium chloride flush   Vital Signs    Vitals:   04/02/16 1300 04/02/16 1957 04/03/16 0558 04/03/16 1007  BP: 108/74 123/85 122/83 120/70  Pulse: (!) 56 (!) 55 61 60  Resp: 20 18 16    Temp: 97.7 F (36.5 C) 97.7 F (36.5 C) 97.7 F (36.5 C)   TempSrc: Oral Oral Oral   SpO2: 99% 99% 98% 99%  Weight:   134.7 kg (296 lb 14.4 oz)   Height:        Intake/Output Summary (Last 24 hours) at 04/03/16 1110 Last data filed at 04/03/16 1038  Gross per 24 hour  Intake              1200 ml  Output             3500 ml  Net            -2300 ml   Filed Weights   04/01/16 0334 04/02/16 0602 04/03/16 0558  Weight: (!) 136.5 kg (301 lb) 135.7 kg (299 lb 1.6 oz) 134.7 kg (296 lb 14.4 oz)    Physical Exam    GEN: Obese, well developed, in no acute distress. Sitting in chair HEENT: Grossly normal.  Neck: Supple, no JVD, carotid bruits, or masses. Cardiac: RRR, 1/6 systolic murmur. Mechanical s2. No rub or gallop. No clubbing, cyanosis. Radials/DP/PT 2+ and equal bilaterally. No edema Respiratory:  Respirations regular and unlabored, clear to auscultation bilaterally. GI: Soft, nontender, nondistended, BS + x 4. MS: no deformity or atrophy. Skin: warm and dry, no rash. Neuro:  Strength and sensation are intact. Psych: AAOx3.  Normal affect.  Labs    CBC  Recent Labs  04/02/16 0311 04/03/16 0411  WBC 6.5 6.8  HGB 15.1 15.1  HCT 42.2 42.9  MCV 77.7* 78.3  PLT 165 0000000   Basic Metabolic Panel  Recent Labs  04/02/16 0311 04/03/16 0411  NA 139 138  K 3.8 3.9  CL 101 101  CO2 27 28  GLUCOSE 165* 182*  BUN 18 22*  CREATININE 1.35* 1.49*  CALCIUM 9.2 9.2   Cardiac Enzymes No results for input(s): CKTOTAL, CKMB, CKMBINDEX, TROPONINI in the last 72 hours.   Telemetry    NSR 04/03/2016  - Personally Reviewed  ECG    NSR with IVCD - No VT. Personally Reviewed  Radiology    CXR 03/30/16 CE mild interstitial edema  Cardiac Studies   Echocardiogram 03/31/16:  Study Conclusions  - Left ventricle: The cavity size was mildly dilated. There was   mild concentric hypertrophy. Systolic function was normal. The   estimated ejection fraction was in the range of 55% to 60%. Wall   motion was normal; there were no regional wall motion   abnormalities. Features are consistent with a pseudonormal left   ventricular filling pattern, with concomitant abnormal relaxation   and increased filling pressure (grade 2 diastolic dysfunction). - Aortic  valve: A mechanical prosthesis was present. Mean gradient   (S): 19 mm Hg. Valve area (VTI): 1.36 cm^2. Valve area (Vmax):   1.22 cm^2. Valve area (Vmean): 1.19 cm^2. - Left atrium: The atrium was moderately dilated.  Patient Profile     50 yo with prior Bentall With mechanical aortic valve for aortic dissection and RCA SVG. Now has SVG RCA bare-metal stent placed in 2015 during an episode of unstable angina. Developed CHF over the past 6-8 weeks. The patient advocates medication compliance. No chest discomfort.   Assessment & Plan    1. Acute on chronic diastolic heart failure improving with diuresis. Weight is down from 310 pounds . Overall down 16 pounds. BUN/CR continues to bump. --will hold diuretics today. Switch back to po diuretics in am. Hoe tomorrow if renal function ok. On d/c would alternate lasix 40 daily with 80 daily (previously on 40 daily).  2. Mechanical aortic valve prosthesis, s/p Bentall. We have continued Coumadin since admission. INR Rx 3. CAD with prior RCA SVG and subsequent SVG  RCA stent. No plan for ischemic evaluation at this time. 4. PAF, no recurrence. Continue coumadin. 5. Hypokalemia. Improved  Signed, Glori Bickers, MD  04/03/2016, 11:10 AM

## 2016-04-03 NOTE — Progress Notes (Signed)
ANTICOAGULATION CONSULT NOTE - Follow Up Consult  Pharmacy Consult for warfarin Indication: atrial fibrillation  Allergies  Allergen Reactions  . Insulin Glargine Swelling    edema    Patient Measurements: Height: 6\' 2"  (188 cm) Weight: 296 lb 14.4 oz (134.7 kg) (scale c) IBW/kg (Calculated) : 82.2  Vital Signs: Temp: 97.7 F (36.5 C) (11/12 0558) Temp Source: Oral (11/12 0558) BP: 120/70 (11/12 1007) Pulse Rate: 60 (11/12 1007)  Labs:  Recent Labs  04/01/16 0313 04/02/16 0311 04/03/16 0411  HGB 14.6 15.1 15.1  HCT 40.8 42.2 42.9  PLT 164 165 175  LABPROT 26.2* 26.0* 28.2*  INR 2.35 2.34 2.58  CREATININE 1.21 1.35* 1.49*    Estimated Creatinine Clearance: 85.6 mL/min (by C-G formula based on SCr of 1.49 mg/dL (H)).  Assessment:  50 yo male with PMH of AAA s/p bentall, CAD s/p BMS to SVG to RCA, DM, HTN, HLD and PAFpresented on 03/30/2016 with c/o dyspnea and orthopnea for 2 days. Pharmacy consulted to restart home warfarin for atrial fibrillation. INR 2.58. CBC stable.   PTA dose: On Coumadin 5mg  daily exc for 7.5mg  on Mon/Fri PTA for Afib  Goal of Therapy:  INR 2-3 Monitor platelets by anticoagulation protocol: Yes   Plan:  Coumadin 5mg  PO x 1 Monitor daily INR, CBC, s/s of bleed  Angela Burke, PharmD Pharmacy Resident Pager: [phone] 04/03/2016,10:22 AM

## 2016-04-04 LAB — CBC
HCT: 42.5 % (ref 39.0–52.0)
HEMOGLOBIN: 15.1 g/dL (ref 13.0–17.0)
MCH: 27.8 pg (ref 26.0–34.0)
MCHC: 35.5 g/dL (ref 30.0–36.0)
MCV: 78.1 fL (ref 78.0–100.0)
PLATELETS: 163 10*3/uL (ref 150–400)
RBC: 5.44 MIL/uL (ref 4.22–5.81)
RDW: 15.2 % (ref 11.5–15.5)
WBC: 5.9 10*3/uL (ref 4.0–10.5)

## 2016-04-04 LAB — BASIC METABOLIC PANEL
ANION GAP: 9 (ref 5–15)
BUN: 27 mg/dL — ABNORMAL HIGH (ref 6–20)
CALCIUM: 9.1 mg/dL (ref 8.9–10.3)
CHLORIDE: 101 mmol/L (ref 101–111)
CO2: 26 mmol/L (ref 22–32)
Creatinine, Ser: 1.48 mg/dL — ABNORMAL HIGH (ref 0.61–1.24)
GFR calc non Af Amer: 53 mL/min — ABNORMAL LOW (ref >60)
Glucose, Bld: 208 mg/dL — ABNORMAL HIGH (ref 65–99)
Potassium: 3.7 mmol/L (ref 3.5–5.1)
SODIUM: 136 mmol/L (ref 135–145)

## 2016-04-04 LAB — GLUCOSE, CAPILLARY
GLUCOSE-CAPILLARY: 172 mg/dL — AB (ref 65–99)
GLUCOSE-CAPILLARY: 219 mg/dL — AB (ref 65–99)
Glucose-Capillary: 209 mg/dL — ABNORMAL HIGH (ref 65–99)
Glucose-Capillary: 165 mg/dL — ABNORMAL HIGH (ref 65–99)

## 2016-04-04 LAB — PROTIME-INR
INR: 2.31
PROTHROMBIN TIME: 25.8 s — AB (ref 11.4–15.2)

## 2016-04-04 MED ORDER — FUROSEMIDE 10 MG/ML IJ SOLN
40.0000 mg | Freq: Once | INTRAMUSCULAR | Status: AC
  Administered 2016-04-04: 40 mg via INTRAVENOUS
  Filled 2016-04-04: qty 4

## 2016-04-04 MED ORDER — POTASSIUM CHLORIDE CRYS ER 20 MEQ PO TBCR
40.0000 meq | EXTENDED_RELEASE_TABLET | Freq: Two times a day (BID) | ORAL | Status: DC
  Administered 2016-04-04 – 2016-04-06 (×5): 40 meq via ORAL
  Filled 2016-04-04 (×4): qty 2

## 2016-04-04 MED ORDER — WARFARIN SODIUM 7.5 MG PO TABS
7.5000 mg | ORAL_TABLET | Freq: Once | ORAL | Status: AC
  Administered 2016-04-04: 7.5 mg via ORAL
  Filled 2016-04-04: qty 1

## 2016-04-04 NOTE — Progress Notes (Signed)
ANTICOAGULATION CONSULT NOTE - Follow Up Consult  Pharmacy Consult for Coumadin Indication: atrial fibrillation, mechanical aortic valve s/p Bentall, hx TIA  Allergies  Allergen Reactions  . Insulin Glargine Swelling    edema    Patient Measurements: Height: 6\' 2"  (188 cm) Weight: 297 lb 14.4 oz (135.1 kg) (Scale C) IBW/kg (Calculated) : 82.2  Vital Signs: Temp: 98 F (36.7 C) (11/13 0631) Temp Source: Oral (11/13 0631) BP: 113/80 (11/13 0631) Pulse Rate: 60 (11/13 0631)  Labs:  Recent Labs  04/02/16 0311 04/03/16 0411 04/04/16 0400  HGB 15.1 15.1 15.1  HCT 42.2 42.9 42.5  PLT 165 175 163  LABPROT 26.0* 28.2* 25.8*  INR 2.34 2.58 2.31  CREATININE 1.35* 1.49* 1.48*    Estimated Creatinine Clearance: 86.4 mL/min (by C-G formula based on SCr of 1.48 mg/dL (H)).  Assessment: 50yom continues on coumadin for hx afib, mechanical aortic valve s/p Bentall, and TIA. INR has been therapeutic since admission and remains at goal today 2.31. CBC stable. No bleeding.  Home dose: 5mg  daily except 7.5mg  Mon/Fri  Goal of Therapy:  INR 2-3 Monitor platelets by anticoagulation protocol: Yes   Plan:  1) Coumadin 7.5mg  x 1 2) Daily INR  Deboraha Sprang 04/04/2016,8:24 AM

## 2016-04-04 NOTE — Progress Notes (Signed)
Patient Name: [name] Date of Encounter: 04/04/2016  Primary Cardiologist: [facility] Problem List     Principal Problem:   Acute on chronic diastolic CHF (congestive heart failure) (HCC) Active Problems:   Diabetes ([place])   Essential hypertension   History of cardiovascular disorder   Ascending aortic dissection  in 2000- s/p Bentall and MDT tilting disc AVR   PAF (paroxysmal atrial fibrillation) (HCC)   CAD of artery bypass graft- SVG-RCA BMS 2/271/5   Hypothyroidism     Subjective   Still has orthopnea.  Inpatient Medications    Scheduled Meds: . acarbose  25 mg Oral TID WC  . aspirin EC  81 mg Oral Daily  . canagliflozin  100 mg Oral QAC breakfast  . cloNIDine  0.1 mg Oral BID  . furosemide  40 mg Intravenous Once  . glimepiride  4 mg Oral QAC breakfast  . insulin aspart  0-15 Units Subcutaneous TID WC  . levothyroxine  50 mcg Oral QAC breakfast  . losartan  100 mg Oral Daily  . metoprolol  200 mg Oral Daily  . potassium chloride SA  40 mEq Oral Daily  . pravastatin  40 mg Oral Daily  . sodium chloride flush  3 mL Intravenous Q12H  . warfarin  7.5 mg Oral ONCE-1800  . Warfarin - Pharmacist Dosing Inpatient   Does not apply q1800   Continuous Infusions:  PRN Meds: sodium chloride, acetaminophen, magnesium hydroxide, ondansetron (ZOFRAN) IV, sodium chloride flush   Vital Signs    Vitals:   04/03/16 1254 04/03/16 1934 04/04/16 0631 04/04/16 0700  BP: 109/66 116/72 113/80   Pulse: (!) 56 (!) 58 60   Resp: 20 18 18    Temp: 98 F (36.7 C) 98.1 F (36.7 C) 98 F (36.7 C)   TempSrc: Oral Oral Oral   SpO2: 98% 98% 98%   Weight:    297 lb 14.4 oz (135.1 kg)  Height:        Intake/Output Summary (Last 24 hours) at 04/04/16 0929 Last data filed at 04/04/16 0700  Gross per 24 hour  Intake              960 ml  Output             2525 ml  Net            -1565 ml   Filed Weights   04/02/16 0602 04/03/16 0558 04/04/16 0700  Weight: 299  lb 1.6 oz (135.7 kg) 296 lb 14.4 oz (134.7 kg) 297 lb 14.4 oz (135.1 kg)    Physical Exam    GEN: Well nourished, well developed, in no acute distress.  HEENT: Grossly normal.  Neck: Supple, no JVD, carotid bruits, or masses. Cardiac: RRR, no murmurs, rubs, or gallops. Crisp prosthetic valve sounds. No clubbing, cyanosis, edema.  Radials/DP/PT 2+ and equal bilaterally.  Respiratory:  Respirations regular and unlabored, clear to auscultation bilaterally. GI: Soft, nontender, nondistended, BS + x 4. MS: no deformity or atrophy. Skin: warm and dry, no rash. Neuro:  Strength and sensation are intact. Psych: AAOx3.  Normal affect.  Labs    CBC  Recent Labs  04/03/16 0411 04/04/16 0400  WBC 6.8 5.9  HGB 15.1 15.1  HCT 42.9 42.5  MCV 78.3 78.1  PLT 175 [id number]   Basic Metabolic Panel  Recent Labs  04/03/16 0411 04/04/16 0400  NA 138 136  K 3.9 3.7  CL 101 101  CO2 28 26  GLUCOSE 182*  208*  BUN 22* 27*  CREATININE 1.49* 1.48*  CALCIUM 9.2 9.1     Telemetry    NSR - Personally Reviewed  ECG    03/31/16 NSR, PACs, Q waves III and aVF, PRWP, mild QTc prolongation - Personally Reviewed  Radiology    No results found.  Cardiac Studies   ECHO 03/31/16 - Left ventricle: The cavity size was mildly dilated. There was   mild concentric hypertrophy. Systolic function was normal. The   estimated ejection fraction was in the range of 55% to 60%. Wall   motion was normal; there were no regional wall motion   abnormalities. Features are consistent with a pseudonormal left   ventricular filling pattern, with concomitant abnormal relaxation   and increased filling pressure (grade 2 diastolic dysfunction). - Aortic valve: A mechanical prosthesis was present. Mean gradient   (S): 19 mm Hg. Valve area (VTI): 1.36 cm^2. Valve area (Vmax):   1.22 cm^2. Valve area (Vmean): 1.19 cm^2. - Left atrium: The atrium was moderately dilated.  Patient Profile     Obese man with acute  diastolic heart failure and history of Bentall procedure with mechanical AVR and SVG-RCA (after aortic dissection), PAFib. Improving with diuresis.  Assessment & Plan    1. Acute diastolic HF: still symptomatic and roughly 5-6 lb above his self-estimated "dry weight" of 292 lb. Additional dose of iv furosemide today. Reviewed importance of daily weight monitoring and sodium dietary restriction. 2. Acute renal insufficiency: due to aggressive diuresis, not unexpected and not prohibitive for further diuresis. Note very large muscle mass contributing to elevation in creat. 3. Mechanical AVR: normal valve performance on current echo. Therapeutically anticoagulated. 4. PAFib:  Not currently an issue.   Signed, Sanda Klein, MD  04/04/2016, 9:29 AM

## 2016-04-04 NOTE — Care Management Important Message (Signed)
Important Message  Patient Details  Name: Azarian Mcguigan MRN: [id number] Date of Birth: 04/30/1966   Medicare Important Message Given:  Yes    Reginae Wolfrey 04/04/2016, 11:34 AM

## 2016-04-04 NOTE — Progress Notes (Signed)
Discussed with patient, information in "Living Better with Heart Failure" booklet. He stated he weighs himself once per month at gym.  Reviewed that it is best to weigh himself daily 1st thing in the morning after he voids and record on weight sheet provided in booklet.  He stated he will change his practice.  Discussed fluid and salt restrictions, taking medications as prescribed, going to MD appointments (stated he always goes), and staying active.

## 2016-04-05 LAB — CBC
HEMATOCRIT: 42.4 % (ref 39.0–52.0)
HEMOGLOBIN: 15.2 g/dL (ref 13.0–17.0)
MCH: 27.8 pg (ref 26.0–34.0)
MCHC: 35.8 g/dL (ref 30.0–36.0)
MCV: 77.7 fL — AB (ref 78.0–100.0)
Platelets: 159 10*3/uL (ref 150–400)
RBC: 5.46 MIL/uL (ref 4.22–5.81)
RDW: 14.8 % (ref 11.5–15.5)
WBC: 5.9 10*3/uL (ref 4.0–10.5)

## 2016-04-05 LAB — GLUCOSE, CAPILLARY
GLUCOSE-CAPILLARY: 161 mg/dL — AB (ref 65–99)
GLUCOSE-CAPILLARY: 153 mg/dL — AB (ref 65–99)
Glucose-Capillary: 168 mg/dL — ABNORMAL HIGH (ref 65–99)
Glucose-Capillary: 170 mg/dL — ABNORMAL HIGH (ref 65–99)

## 2016-04-05 LAB — BASIC METABOLIC PANEL
ANION GAP: 8 (ref 5–15)
BUN: 24 mg/dL — ABNORMAL HIGH (ref 6–20)
CALCIUM: 9.1 mg/dL (ref 8.9–10.3)
CO2: 25 mmol/L (ref 22–32)
Chloride: 104 mmol/L (ref 101–111)
Creatinine, Ser: 1.39 mg/dL — ABNORMAL HIGH (ref 0.61–1.24)
GFR calc non Af Amer: 57 mL/min — ABNORMAL LOW (ref >60)
GLUCOSE: 156 mg/dL — AB (ref 65–99)
POTASSIUM: 4.3 mmol/L (ref 3.5–5.1)
Sodium: 137 mmol/L (ref 135–145)

## 2016-04-05 LAB — PROTIME-INR
INR: 2.69
Prothrombin Time: 29.2 seconds — ABNORMAL HIGH (ref 11.4–15.2)

## 2016-04-05 MED ORDER — FUROSEMIDE 10 MG/ML IJ SOLN
80.0000 mg | Freq: Two times a day (BID) | INTRAMUSCULAR | Status: AC
  Administered 2016-04-05 (×2): 80 mg via INTRAVENOUS
  Filled 2016-04-05 (×2): qty 8

## 2016-04-05 MED ORDER — WARFARIN SODIUM 5 MG PO TABS
5.0000 mg | ORAL_TABLET | Freq: Once | ORAL | Status: AC
  Administered 2016-04-05: 5 mg via ORAL
  Filled 2016-04-05: qty 1

## 2016-04-05 NOTE — Progress Notes (Signed)
ANTICOAGULATION CONSULT NOTE - Follow Up Consult  Pharmacy Consult for Coumadin Indication: atrial fibrillation, mechanical aortic valve s/p Bentall, hx TIA  Allergies  Allergen Reactions  . Insulin Glargine Swelling    edema    Patient Measurements: Height: 6\' 2"  (188 cm) Weight: 297 lb 14.4 oz (135.1 kg) IBW/kg (Calculated) : 82.2  Vital Signs: Temp: 97.6 F (36.4 C) (11/14 0548) Temp Source: Oral (11/14 0548) BP: 108/70 (11/14 0548) Pulse Rate: 51 (11/14 0548)  Labs:  Recent Labs  04/03/16 0411 04/04/16 0400 04/05/16 0401  HGB 15.1 15.1 15.2  HCT 42.9 42.5 42.4  PLT 175 163 159  LABPROT 28.2* 25.8* 29.2*  INR 2.58 2.31 2.69  CREATININE 1.49* 1.48* 1.39*    Estimated Creatinine Clearance: 92 mL/min (by C-G formula based on SCr of 1.39 mg/dL (H)).  Assessment: 50yom continues on coumadin for hx afib, mechanical aortic valve s/p Bentall, and TIA. INR remains therapeutic today at 2.69. Will continue with PTA dose. CBC WNL and stable.   PTA dose per coumadin clinic: 5mg  daily except 7.5mg  MWF  Goal of Therapy:  INR 2-3 Monitor platelets by anticoagulation protocol: Yes   Plan:  -Coumadin 5mg  x1 tonight -Daily INR -Monitor CBC, s/sx bleeding   Stephens November, PharmD Clinical Pharmacist 8:27 AM, 04/05/2016

## 2016-04-05 NOTE — Progress Notes (Signed)
Patient Name: [name] Date of Encounter: 04/05/2016  Principal Problem:   Acute on chronic diastolic CHF (congestive heart failure) ([place]) Active Problems:   Diabetes ([place])   Essential hypertension   History of cardiovascular disorder   Ascending aortic dissection  in 2000- s/p Bentall and MDT tilting disc AVR   PAF (paroxysmal atrial fibrillation) (HCC)   CAD of artery bypass graft- SVG-RCA BMS 2/271/5   Hypothyroidism   Length of Stay: 6  SUBJECTIVE  Slept with bed up at 30-40 degrees. No change in weight despite 1.5L net diuresis (suspect intake is not accurately charted).  CURRENT MEDS . acarbose  25 mg Oral TID WC  . aspirin EC  81 mg Oral Daily  . canagliflozin  100 mg Oral QAC breakfast  . cloNIDine  0.1 mg Oral BID  . furosemide  80 mg Intravenous Q12H  . glimepiride  4 mg Oral QAC breakfast  . insulin aspart  0-15 Units Subcutaneous TID WC  . levothyroxine  50 mcg Oral QAC breakfast  . losartan  100 mg Oral Daily  . metoprolol  200 mg Oral Daily  . potassium chloride SA  40 mEq Oral BID  . pravastatin  40 mg Oral Daily  . sodium chloride flush  3 mL Intravenous Q12H  . warfarin  5 mg Oral ONCE-1800  . Warfarin - Pharmacist Dosing Inpatient   Does not apply q1800    OBJECTIVE   Intake/Output Summary (Last 24 hours) at 04/05/16 0916 Last data filed at 04/05/16 0835  Gross per 24 hour  Intake             1023 ml  Output             2475 ml  Net            -1452 ml   Filed Weights   04/03/16 0558 04/04/16 0700 04/05/16 0548  Weight: 296 lb 14.4 oz (134.7 kg) 297 lb 14.4 oz (135.1 kg) 297 lb 14.4 oz (135.1 kg)    PHYSICAL EXAM Vitals:   04/04/16 0955 04/04/16 1205 04/04/16 2129 04/05/16 0548  BP: 134/76 113/77 121/80 108/70  Pulse: (!) 57 (!) 56 (!) 52 (!) 51  Resp:  18 18 18   Temp:  97.9 F (36.6 C) 97.5 F (36.4 C) 97.6 F (36.4 C)  TempSrc:  Oral Oral Oral  SpO2:  98% 100% 100%  Weight:    297 lb 14.4 oz (135.1 kg)  Height:        GEN: Well nourished, well developed, in no acute distress.  HEENT: Grossly normal.  Neck: Supple, no JVD, carotid bruits, or masses. Cardiac: RRR, no murmurs, rubs, or gallops. Crisp prosthetic valve sounds. No clubbing, cyanosis, edema.  Radials/DP/PT 2+ and equal bilaterally.  Respiratory:  Respirations regular and unlabored, clear to auscultation bilaterally. GI: Soft, nontender, nondistended, BS + x 4. MS: no deformity or atrophy. Skin: warm and dry, no rash. Neuro:  Strength and sensation are intact. Psych: AAOx3.  Normal affect.  LABS  CBC  Recent Labs  04/04/16 0400 04/05/16 0401  WBC 5.9 5.9  HGB 15.1 15.2  HCT 42.5 42.4  MCV 78.1 77.7*  PLT 163 [id number]   Basic Metabolic Panel  Recent Labs  04/04/16 0400 04/05/16 0401  NA 136 137  K 3.7 4.3  CL 101 104  CO2 26 25  GLUCOSE 208* 156*  BUN 27* 24*  CREATININE 1.48* 1.39*  CALCIUM 9.1 9.1    Radiology Studies Imaging results have  been reviewed  TELE NSR  ASSESSMENT AND PLAN  Obese man with acute diastolic heart failure and history of Bentall procedure with mechanical AVR and SVG-RCA (after aortic dissection), PAFib. Improving with diuresis.  1. Acute diastolic HF: still symptomatic and roughly 5-6 lb above his self-estimated "dry weight" of 292 lb. Higher dose of iv furosemide today. Reviewed importance of daily weight monitoring and sodium dietary restriction. 2. Acute renal insufficiency: improving. Note very large muscle mass contributing to elevation in creat. 3. Mechanical AVR: normal valve performance on current echo. Therapeutically anticoagulated. 4. PAFib:  Not currently an issue.     Sanda Klein, MD, [facility] [phone] office [phone] pager 04/05/2016 9:16 AM

## 2016-04-06 ENCOUNTER — Other Ambulatory Visit: Payer: Self-pay | Admitting: Cardiology

## 2016-04-06 ENCOUNTER — Telehealth: Payer: Self-pay | Admitting: Cardiology

## 2016-04-06 DIAGNOSIS — N183 Chronic kidney disease, stage 3 unspecified: Secondary | ICD-10-CM

## 2016-04-06 DIAGNOSIS — N289 Disorder of kidney and ureter, unspecified: Secondary | ICD-10-CM

## 2016-04-06 DIAGNOSIS — I7101 Dissection of thoracic aorta: Secondary | ICD-10-CM

## 2016-04-06 DIAGNOSIS — I5021 Acute systolic (congestive) heart failure: Secondary | ICD-10-CM

## 2016-04-06 LAB — BASIC METABOLIC PANEL
Anion gap: 10 (ref 5–15)
BUN: 27 mg/dL — AB (ref 6–20)
CALCIUM: 9.3 mg/dL (ref 8.9–10.3)
CHLORIDE: 103 mmol/L (ref 101–111)
CO2: 23 mmol/L (ref 22–32)
CREATININE: 1.38 mg/dL — AB (ref 0.61–1.24)
GFR calc non Af Amer: 58 mL/min — ABNORMAL LOW (ref >60)
GLUCOSE: 164 mg/dL — AB (ref 65–99)
Potassium: 4.4 mmol/L (ref 3.5–5.1)
Sodium: 136 mmol/L (ref 135–145)

## 2016-04-06 LAB — CBC
HCT: 44.6 % (ref 39.0–52.0)
HEMOGLOBIN: 15.9 g/dL (ref 13.0–17.0)
MCH: 27.7 pg (ref 26.0–34.0)
MCHC: 35.7 g/dL (ref 30.0–36.0)
MCV: 77.8 fL — ABNORMAL LOW (ref 78.0–100.0)
PLATELETS: 167 10*3/uL (ref 150–400)
RBC: 5.73 MIL/uL (ref 4.22–5.81)
RDW: 14.7 % (ref 11.5–15.5)
WBC: 6.7 10*3/uL (ref 4.0–10.5)

## 2016-04-06 LAB — GLUCOSE, CAPILLARY
Glucose-Capillary: 164 mg/dL — ABNORMAL HIGH (ref 65–99)
Glucose-Capillary: 170 mg/dL — ABNORMAL HIGH (ref 65–99)

## 2016-04-06 LAB — BRAIN NATRIURETIC PEPTIDE: B Natriuretic Peptide: 50.6 pg/mL (ref 0.0–100.0)

## 2016-04-06 LAB — PROTIME-INR
INR: 2.84
PROTHROMBIN TIME: 30.4 s — AB (ref 11.4–15.2)

## 2016-04-06 MED ORDER — POTASSIUM CHLORIDE CRYS ER 20 MEQ PO TBCR: 40.0000 meq | EXTENDED_RELEASE_TABLET | Freq: Every day | ORAL | 6 refills | Status: DC

## 2016-04-06 MED ORDER — WARFARIN SODIUM 5 MG PO TABS: ORAL_TABLET | ORAL | 1 refills | Status: DC

## 2016-04-06 MED ORDER — FUROSEMIDE 80 MG PO TABS
80.0000 mg | ORAL_TABLET | Freq: Every day | ORAL | Status: DC
  Administered 2016-04-06: 80 mg via ORAL
  Filled 2016-04-06: qty 1

## 2016-04-06 MED ORDER — LOSARTAN POTASSIUM 100 MG PO TABS: 100.0000 mg | ORAL_TABLET | Freq: Every day | ORAL | 1 refills | Status: DC

## 2016-04-06 MED ORDER — WARFARIN SODIUM 7.5 MG PO TABS: 7.5000 mg | ORAL_TABLET | Freq: Once | ORAL | Status: DC

## 2016-04-06 MED ORDER — POTASSIUM CHLORIDE CRYS ER 20 MEQ PO TBCR
40.0000 meq | EXTENDED_RELEASE_TABLET | Freq: Every day | ORAL | Status: DC
  Administered 2016-04-06: 40 meq via ORAL
  Filled 2016-04-06: qty 2

## 2016-04-06 MED ORDER — FUROSEMIDE 80 MG PO TABS: 80.0000 mg | ORAL_TABLET | Freq: Every day | ORAL | 6 refills | Status: DC

## 2016-04-06 NOTE — Progress Notes (Signed)
ANTICOAGULATION CONSULT NOTE - Follow Up Consult  Pharmacy Consult:  Coumadin Indication: atrial fibrillation, mechanical aortic valve s/p Bentall, hx TIA  Allergies  Allergen Reactions  . Insulin Glargine Swelling    edema    Patient Measurements: Height: 6\' 2"  (188 cm) Weight: 294 lb 14.4 oz (133.8 kg) IBW/kg (Calculated) : 82.2  Vital Signs: Temp: 97.4 F (36.3 C) (11/15 0628) Temp Source: Oral (11/15 0628) BP: 113/77 (11/15 0628) Pulse Rate: 62 (11/15 0628)  Labs:  Recent Labs  04/04/16 0400 04/05/16 0401 04/06/16 0346  HGB 15.1 15.2 15.9  HCT 42.5 42.4 44.6  PLT 163 159 167  LABPROT 25.8* 29.2* 30.4*  INR 2.31 2.69 2.84  CREATININE 1.48* 1.39* 1.38*    Estimated Creatinine Clearance: 92.1 mL/min (by C-G formula based on SCr of 1.38 mg/dL (H)).   Assessment: 24 YOM continues on Coumadin for hx afib, mechanical aortic valve s/p Bentall, and TIA. INR remains therapeutic; CBC WNL and stable.    Goal of Therapy:  INR 2-3 per [facility] clinic   Plan:  - Coumadin 7.5mg  PO today - Daily PT / INR    Sonja Manseau D. Mina Marble, PharmD, BCPS Pager:  [phone] 04/06/2016, 8:49 AM

## 2016-04-06 NOTE — Progress Notes (Signed)
Discharge instructions reviewed with patient and spouse, iv removed, vital signs are stable, questions answered Neta Mends RN 3:05 PM 04-06-2016

## 2016-04-06 NOTE — Discharge Instructions (Signed)
Weigh daily Call [phone] if weight climbs more than 3 pounds in a day or 5 pounds in a week. No salt to very little salt in your diet.  No more than 2000 mg in a day. Call if increased shortness of breath or increased swelling.  Heart Healthy diabetic low salt diet  We increased your home lasix and potassium

## 2016-04-06 NOTE — Telephone Encounter (Signed)
TOC Phone call. appt is on 04/13/16 at 9:30am w/ Ignacia Bayley .[name] Thanks

## 2016-04-06 NOTE — Telephone Encounter (Signed)
Pt still in hospital 03/30/2016 - present (7 days) [place]

## 2016-04-06 NOTE — Progress Notes (Signed)
Patient Name: [name] Date of Encounter: 04/06/2016  Primary Cardiologist: [facility] Problem List     Principal Problem:   Acute on chronic diastolic CHF (congestive heart failure) (HCC) Active Problems:   Diabetes ([place])   Essential hypertension   History of cardiovascular disorder   Ascending aortic dissection  in 2000- s/p Bentall and MDT tilting disc AVR   PAF (paroxysmal atrial fibrillation) (HCC)   CAD of artery bypass graft- SVG-RCA BMS 2/271/5   Hypothyroidism     Subjective   Breathing better, able to walk hallway without O2. Still a little uncomfortable lying flat. 294 lb today, close to his estimated dry weight of 292.  Inpatient Medications    Scheduled Meds: . acarbose  25 mg Oral TID WC  . aspirin EC  81 mg Oral Daily  . canagliflozin  100 mg Oral QAC breakfast  . cloNIDine  0.1 mg Oral BID  . furosemide  80 mg Oral Daily  . glimepiride  4 mg Oral QAC breakfast  . insulin aspart  0-15 Units Subcutaneous TID WC  . levothyroxine  50 mcg Oral QAC breakfast  . losartan  100 mg Oral Daily  . metoprolol  200 mg Oral Daily  . [START ON 04/07/2016] potassium chloride SA  40 mEq Oral Daily  . pravastatin  40 mg Oral Daily  . sodium chloride flush  3 mL Intravenous Q12H  . warfarin  7.5 mg Oral ONCE-1800  . Warfarin - Pharmacist Dosing Inpatient   Does not apply q1800   Continuous Infusions:  PRN Meds: sodium chloride, acetaminophen, magnesium hydroxide, ondansetron (ZOFRAN) IV, sodium chloride flush   Vital Signs    Vitals:   04/05/16 1200 04/05/16 2038 04/06/16 0013 04/06/16 0628  BP: 116/73 139/90 110/61 113/77  Pulse: (!) 56 61  62  Resp: 18 18  18   Temp: 97.7 F (36.5 C) 97.5 F (36.4 C)  97.4 F (36.3 C)  TempSrc: Oral Oral  Oral  SpO2: 100% 100%  99%  Weight:    294 lb 14.4 oz (133.8 kg)  Height:        Intake/Output Summary (Last 24 hours) at 04/06/16 1036 Last data filed at 04/06/16 0630  Gross per 24 hour  Intake               840 ml  Output             3025 ml  Net            -2185 ml   Filed Weights   04/04/16 0700 04/05/16 0548 04/06/16 0628  Weight: 297 lb 14.4 oz (135.1 kg) 297 lb 14.4 oz (135.1 kg) 294 lb 14.4 oz (133.8 kg)    Physical Exam    [id number] nourished, well developed, in no acute distress.  HEENT:Grossly normal.  Neck:Supple, no JVD, carotid bruits, or masses. Cardiac:RRR, no murmurs, rubs, or gallops. Crisp prosthetic valve sounds. No clubbing, cyanosis, edema. Radials/DP/PT 2+ and equal bilaterally.  Respiratory:Respirations regular and unlabored, clear to auscultation bilaterally. [id number], nontender, nondistended, BS + x 4. MS:no deformity or atrophy. Skin:warm and dry, no rash. Neuro:Strength and sensation are intact. Psych:AAOx3. Normal affect.   Labs    CBC  Recent Labs  04/05/16 0401 04/06/16 0346  WBC 5.9 6.7  HGB 15.2 15.9  HCT 42.4 44.6  MCV 77.7* 77.8*  PLT 159 [id number]   Basic Metabolic Panel  Recent Labs  04/05/16 0401 04/06/16 0346  NA 137 136  K 4.3 4.4  CL 104 103  CO2 25 23  GLUCOSE 156* 164*  BUN 24* 27*  CREATININE 1.39* 1.38*  CALCIUM 9.1 9.3     Telemetry    NSR - Personally Reviewed   Patient Profile     Obese man with acute diastolic heart failure and history of Bentall procedure with mechanical AVR and SVG-RCA (after aortic dissection), PAFib. Improved with diuresis  Assessment & Plan    .  1.Acute diastolic HF: Improved, almost at dry weight. Will DC on double previous diuretic dose. Reviewed importance of daily weight monitoring and sodium dietary restriction. Call if he gains>3lb overnight or 5 lb/week. TCM 1 week and BMET 1 week. 2. Acute renal insufficiency: on improving trend, slightly above baseline 1.1-1.2. Note  large muscle mass contributing to elevation in creat. 3. Mechanical AVR: normal valve performance on current echo. Therapeutically anticoagulated. Try to keep INR 2.5-3.0. 4. PAFib: Not  currently an issue.   Signed, Sanda Klein, MD  04/06/2016, 10:36 AM

## 2016-04-06 NOTE — Discharge Summary (Signed)
Physician Discharge Summary       Patient ID: [name] MRN: [id number] DOB/AGE: [birth year] 50 y.o.  Admit date: 03/30/2016 Discharge date: 04/06/2016 Primary Cardiologist:Dr. Stanford Breed   Discharge Diagnoses:  Principal Problem:   Acute on chronic diastolic CHF (congestive heart failure) (HCC) Active Problems:   Diabetes ([place])   Essential hypertension   History of cardiovascular disorder   Ascending aortic dissection  in 2000- s/p Bentall and MDT tilting disc AVR   PAF (paroxysmal atrial fibrillation) (HCC)   CAD of artery bypass graft- SVG-RCA BMS 2/271/5   Hypothyroidism   Discharged Condition: good  Procedures: ECHO 03/31/16 ------------------------------------------------------------------- Study Conclusions  - Left ventricle: The cavity size was mildly dilated. There was   mild concentric hypertrophy. Systolic function was normal. The   estimated ejection fraction was in the range of 55% to 60%. Wall   motion was normal; there were no regional wall motion   abnormalities. Features are consistent with a pseudonormal left   ventricular filling pattern, with concomitant abnormal relaxation   and increased filling pressure (grade 2 diastolic dysfunction). - Aortic valve: A mechanical prosthesis was present. Mean gradient   (S): 19 mm Hg. Valve area (VTI): 1.36 cm^2. Valve area (Vmax):   1.22 cm^2. Valve area (Vmean): 1.19 cm^2. - Left atrium: The atrium was moderately dilated.   Hospital Course:   50 yo male with PMH of AAA s/p bentall, CAD s/p BMS to SVG to RCA, DM, HTN, HLD and PAF. He has a h/o spontaneous ascending aortic dissection with aortic valve involvement status post mechanical AVR with ascending aorta conduit in 2000, and is followed by CVTS.   He underwent LHC in 2/15 showing patent left main, LAD, circumflex and an occluded right coronary artery. The saphenous vein graft to the right coronary artery had an ostial 90% lesion. The patient had PCI of  the saphenous vein graft to the right coronary artery, with BMS placement. He was placed on plavix, along with coumadin for one month, and then resumed on low dose asa and coumadin. During that admission his BNP was noted to be elevated and he was placed on lasix.   Pt had been in his usual state of health. Works out 4-5 days a week at [employer]. Denied any anginal symptoms or dyspnea with exercise routine.  2 days prior to admit he began to develop dyspnea on exertion, and orthopnea. He noticed he was unable to lay down at night, normally uses 2 pillows at night, but had been sleeping propped up at night. Also noticed lower extremity edema. Stated he has been compliant with his home medications, including his coumadin. He sticks to a very strict diet with mostly protein, and generally avoids fatty, greasy foods. The morning of admit 03/30/16 he was getting out of the shower and felt very short of breath, prompting him to come to the ED.  His BNP was elevated, INR 2.6, CXR with edema.   Pt admitted and diuresed.   His Echo with normal EF at 60%.  His coumadin was continued.  Pt without chest pain and negative troponin.     He is negative 16,667 at time of discharge, and wt is down from 310 lbs to 294 lbs.   We are doubling his home po lasix to 80 mg daily.  His hypokalemia has been replaced.   He has been instructed on low salt diet.  His glucose has been controlled.  Cr. At discharge 1.38  BNP 50.6.  He has  no a fib.   He has been seen and evaluated by Dr. Sallyanne Kuster and found stable for discharge. We increased his lasix to 80, KDUR 40 meq.  INR at discharge 2.84. TOC pt - needs INR checked and BMP on follow up visit.    Consults: None  Significant Diagnostic Studies:  BMP Latest Ref Rng & Units 04/06/2016 04/05/2016 04/04/2016  Glucose 65 - 99 mg/dL 164(H) 156(H) 208(H)  BUN 6 - 20 mg/dL 27(H) 24(H) 27(H)  Creatinine 0.61 - 1.24 mg/dL 1.38(H) 1.39(H) 1.48(H)  Sodium 135 - 145 mmol/L 136 137 136    Potassium 3.5 - 5.1 mmol/L 4.4 4.3 3.7  Chloride 101 - 111 mmol/L 103 104 101  CO2 22 - 32 mmol/L 23 25 26   Calcium 8.9 - 10.3 mg/dL 9.3 9.1 9.1    Recent Labs  03/30/16 0805 04/06/16 0346  BNP 408.3* 50.6    CBC Latest Ref Rng & Units 04/06/2016 04/05/2016 04/04/2016  WBC 4.0 - 10.5 K/uL 6.7 5.9 5.9  Hemoglobin 13.0 - 17.0 g/dL 15.9 15.2 15.1  Hematocrit 39.0 - 52.0 % 44.6 42.4 42.5  Platelets 150 - 400 K/uL 167 159 163   Troponin neg X 3 < 0.03   PORTABLE CHEST 1 VIEW COMPARISON:  CT chest 10/09/2014. Single-view of the chest 04/18/2014. FINDINGS: There is cardiomegaly and mild appearing interstitial edema. No consolidative process, pneumothorax or effusion. The patient is status post median sternotomy. IMPRESSION: Cardiomegaly mild appearing interstitial edema.   Discharge Exam: Blood pressure 113/77, pulse 62, temperature 97.4 F (36.3 C), temperature source Oral, resp. rate 18, height 6\' 2"  (1.88 m), weight 294 lb 14.4 oz (133.8 kg), SpO2 99 %.  Disposition: 01-Home or Self Care     Medication List    TAKE these medications   acarbose 25 MG tablet Commonly known as:  PRECOSE Take 1 tablet (25 mg total) by mouth 3 (three) times daily with meals.   bromocriptine 2.5 MG tablet Commonly known as:  PARLODEL 1/4 tab daily   canagliflozin 100 MG Tabs tablet Commonly known as:  INVOKANA Take 1 tablet (100 mg total) by mouth daily before breakfast.   cholecalciferol 1000 units tablet Commonly known as:  VITAMIN D Take 1,000 Units by mouth daily.   cloNIDine 0.1 MG tablet Commonly known as:  CATAPRES TAKE 1 TABLET TWICE DAILY (NEED MD APPOINTMENT)   furosemide 80 MG tablet Commonly known as:  LASIX Take 1 tablet (80 mg total) by mouth daily. Start taking on:  04/07/2016 What changed:  medication strength  how much to take   glimepiride 4 MG tablet Commonly known as:  AMARYL Take 1 tablet (4 mg total) by mouth daily before breakfast.    levothyroxine 50 MCG tablet Commonly known as:  SYNTHROID, LEVOTHROID Take 1 tablet (50 mcg total) by mouth daily.   losartan 100 MG tablet Commonly known as:  COZAAR Take 1 tablet (100 mg total) by mouth daily. What changed:  See the new instructions.   metFORMIN 500 MG 24 hr tablet Commonly known as:  GLUCOPHAGE-XR Take 4 tablets (2,000 mg total) by mouth daily.   metoprolol 200 MG 24 hr tablet Commonly known as:  TOPROL-XL Take 1 tablet (200 mg total) by mouth daily.   nitroGLYCERIN 0.4 MG SL tablet Commonly known as:  NITROSTAT Place 1 tablet (0.4 mg total) under the tongue every 5 (five) minutes x 3 doses as needed for chest pain.   potassium chloride SA 20 MEQ tablet Commonly known as:  K-DUR,KLOR-CON  Take 2 tablets (40 mEq total) by mouth daily. Start taking on:  04/07/2016   pravastatin 40 MG tablet Commonly known as:  PRAVACHOL Take 1 tablet (40 mg total) by mouth daily.   sitaGLIPtin 100 MG tablet Commonly known as:  JANUVIA Take 1 tablet (100 mg total) by mouth daily.   warfarin 5 MG tablet Commonly known as:  COUMADIN pt takes 7.5mg  on Mondays and Fridays - takes 5mg  all other days What changed:  See the new instructions.      Follow-up Information    Cathlean Cower, MD Follow up on 04/12/2016.   Specialties:  Internal Medicine, Radiology Why:  At 2:30pm for hospital follow up  Contact information: [place] [address] [phone]        Kirk Ruths, MD Follow up on 04/13/2016.   Specialty:  Cardiology Why:  at 09:30 AM with Dr. Jacalyn Lefevre Nurse Practitioner Ignacia Bayley  Contact information: [address] [place] [address] [phone]            Discharge Instructions: Weigh daily Call [phone] if weight climbs more than 3 pounds in a day or 5 pounds in a week. No salt to very little salt in your diet.  No more than 2000 mg in a day. Call if increased shortness of breath or increased  swelling.  Heart Healthy diabetic low salt diet  We increased your home lasix and potassium    Signed: Cecilie Kicks Nurse Practitioner-Certified [facility]: [facility] 04/06/2016, 2:04 PM  Time spent on discharge : > 30 minutes.

## 2016-04-08 NOTE — Telephone Encounter (Signed)
Patient contacted regarding discharge from [place] on 04-06-16  Patient understands to follow up with provider berge on 04-13-16 at 9:30 am at [facility] Patient understands discharge instructions? yes Patient understands medications and regiment? yes Patient understands to bring all medications to this visit? yes

## 2016-04-12 ENCOUNTER — Inpatient Hospital Stay: Payer: Medicare HMO | Admitting: Internal Medicine

## 2016-04-13 ENCOUNTER — Encounter: Payer: Self-pay | Admitting: Nurse Practitioner

## 2016-04-13 ENCOUNTER — Ambulatory Visit (INDEPENDENT_AMBULATORY_CARE_PROVIDER_SITE_OTHER): Payer: Medicare HMO | Admitting: Nurse Practitioner

## 2016-04-13 ENCOUNTER — Ambulatory Visit (INDEPENDENT_AMBULATORY_CARE_PROVIDER_SITE_OTHER): Payer: Medicare HMO | Admitting: Pharmacist

## 2016-04-13 VITALS — BP 108/76 | HR 68 | Ht 74.0 in | Wt 298.8 lb

## 2016-04-13 DIAGNOSIS — E782 Mixed hyperlipidemia: Secondary | ICD-10-CM | POA: Diagnosis not present

## 2016-04-13 DIAGNOSIS — Z952 Presence of prosthetic heart valve: Secondary | ICD-10-CM

## 2016-04-13 DIAGNOSIS — I2510 Atherosclerotic heart disease of native coronary artery without angina pectoris: Secondary | ICD-10-CM | POA: Diagnosis not present

## 2016-04-13 DIAGNOSIS — I480 Paroxysmal atrial fibrillation: Secondary | ICD-10-CM | POA: Diagnosis not present

## 2016-04-13 DIAGNOSIS — Z5181 Encounter for therapeutic drug level monitoring: Secondary | ICD-10-CM

## 2016-04-13 DIAGNOSIS — I110 Hypertensive heart disease with heart failure: Secondary | ICD-10-CM

## 2016-04-13 DIAGNOSIS — Z8679 Personal history of other diseases of the circulatory system: Secondary | ICD-10-CM

## 2016-04-13 DIAGNOSIS — I5032 Chronic diastolic (congestive) heart failure: Secondary | ICD-10-CM

## 2016-04-13 LAB — BASIC METABOLIC PANEL
BUN: 26 mg/dL — ABNORMAL HIGH (ref 7–25)
CALCIUM: 9.5 mg/dL (ref 8.6–10.3)
CHLORIDE: 100 mmol/L (ref 98–110)
CO2: 25 mmol/L (ref 20–31)
CREATININE: 1.54 mg/dL — AB (ref 0.70–1.33)
Glucose, Bld: 242 mg/dL — ABNORMAL HIGH (ref 65–99)
Potassium: 4.1 mmol/L (ref 3.5–5.3)
Sodium: 137 mmol/L (ref 135–146)

## 2016-04-13 LAB — POCT INR: INR: 3

## 2016-04-13 NOTE — Patient Instructions (Signed)
Medication Instructions:  Continue current medication  Labwork: BMP Today  Testing/Procedures: None Ordered  Follow-Up: Your physician recommends that you schedule a follow-up appointment in: 1 Months with Ignacia Bayley or Dr Stanford Breed   Any Other Special Instructions Will Be Listed Below (If Applicable).           Happy Thanksgiving  If you need a refill on your cardiac medications before your next appointment, please call your pharmacy.

## 2016-04-13 NOTE — Progress Notes (Signed)
Office Visit    Patient Name: [name] Date of Encounter: 04/13/2016  Primary Care Provider:  Cathlean Cower, MD Primary Cardiologist:  B. Stanford Breed, MD   Chief Complaint    50 year old male with a past medical history of ascending aortic dissection status post bentall w/ AVR and VG  PDA, hypertension, hyperlipidemia, diabetes, paroxysmal atrial fibrillation, and diastolic heart failure who presents for follow-up after recent heart failure hospitalization.  Past Medical History    Past Medical History:  Diagnosis Date  . Ascending aortic dissection ([place])    a. 2000 - with aortic valve involvement. S/p repair of aortic dissection with placement of mechanical AVR.  [name] CAD (coronary artery disease)    a. 2000 VG->PDA @ time of Ao dissection repair;  b. Cardiac CT 06/2010: no CAD, only mild plaque in prox RCA;  c. 2015 Cath: LM nl, LAD nl, LCX nl, RCA 100, VG->PDA 90 (4.5x12 Rebel BMS).  . Cholelithiasis 08/22/2013  . Chronic diastolic CHF (congestive heart failure) ([place])    a. 03/2016 Echo: EF 55-60%, no rwma, Gr2 DD.  . Diabetes mellitus   . Dyspnea   . Gross hematuria   . H/O mechanical aortic valve replacement    a. 03/2016 Echo: AoV mean gradient 17mmHg, Valve Area (VTI) 1.36 cm^2, (Vmax) 1.22 cm^2, mod dil LA.  [name] Hyperlipidemia   . Hypertension   . NEPHROLITHIASIS, HX OF   . Obesity   . PAF (paroxysmal atrial fibrillation) ([place])    a. H/o such, with recurrence of coarse afib/flutter during ER consult 03/2012.  [name] TRANSIENT ISCHEMIC ATTACK, HX OF    a. In 2004.  [name] Unspecified vitamin D deficiency    Past Surgical History:  Procedure Laterality Date  . AORTIC VALVE REPLACEMENT  2000  . APPENDECTOMY  1998  . CORONARY ARTERY BYPASS GRAFT  2000  . LEFT AND RIGHT HEART CATHETERIZATION WITH CORONARY ANGIOGRAM N/A 07/19/2013   Procedure: LEFT AND RIGHT HEART CATHETERIZATION WITH CORONARY ANGIOGRAM;  Surgeon: [name], MD;  Location: [name] CATH LAB;  Service:  Cardiovascular;  Laterality: N/A;  . TEE WITHOUT CARDIOVERSION N/A 07/22/2013   Procedure: TRANSESOPHAGEAL ECHOCARDIOGRAM (TEE);  Surgeon: [name], MD;  Location: [facility] ENDOSCOPY;  Service: Cardiovascular;  Laterality: N/A;    Allergies  Allergies  Allergen Reactions  . Insulin Glargine Swelling    edema    History of Present Illness    50 year old male with the above complex past medical history. He suffered an ascending aortic dissection in 2000 requiring bentall procedure with mechanical aortic valve replacement and vein graft to the PDA. He also has hypertension, hyperlipidemia, paroxysmal atrial fibrillation, type 2 diabetes mellitus, obesity, and diastolic failure. He is on chronic Coumadin anticoagulation. In 2015, he underwent catheterization secondary to chest pain with finding of an occluded right coronary artery. The vein graft to the PDA had a 90% stenosis and this was successfully treated with bare-metal stent placement.  He was recently hospitalized at [facility] secondary to progressive dyspnea and volume overload. He was aggressively diuresed and was -16.7 L for admission. Weight dropped from 310 pounds on admission to 294 pounds on discharge. Echo during admission showed normal LV function with grade 2 diastolic dysfunction. Mechanical aortic valve prosthesis is functioning normally. He was discharged home on November 15 on Lasix 80 mg daily. Since his discharge, he has done well. His weight has been stable between 293 and 294 pounds. He is very careful about his sodium intake and for  the most part has been eating baked chicken with solid on a daily basis. He denies PND, orthopnea, chest pain, palpitations, dizziness, syncope, edema, or early satiety.  Home Medications    Prior to Admission medications   Medication Sig Start Date End Date Taking? Authorizing Provider  acarbose (PRECOSE) 25 MG tablet Take 1 tablet (25 mg total) by mouth 3 (three) times daily with meals.  11/06/15  Yes Renato Shin, MD  bromocriptine (PARLODEL) 2.5 MG tablet 1/4 tab daily 11/06/15  Yes Renato Shin, MD  canagliflozin [facility]) 100 MG TABS tablet Take 1 tablet (100 mg total) by mouth daily before breakfast. 01/19/16  Yes Renato Shin, MD  cholecalciferol (VITAMIN D) 1000 UNITS tablet Take 1,000 Units by mouth daily.   Yes Historical Provider, MD  cloNIDine (CATAPRES) 0.1 MG tablet TAKE 1 TABLET TWICE DAILY (NEED MD APPOINTMENT) 07/28/15  Yes Biagio Borg, MD  furosemide (LASIX) 80 MG tablet Take 1 tablet (80 mg total) by mouth daily. 04/07/16  Yes Isaiah Serge, NP  glimepiride (AMARYL) 4 MG tablet Take 1 tablet (4 mg total) by mouth daily before breakfast. 11/06/15  Yes Renato Shin, MD  levothyroxine (SYNTHROID, LEVOTHROID) 50 MCG tablet Take 1 tablet (50 mcg total) by mouth daily. 12/08/15  Yes Renato Shin, MD  losartan (COZAAR) 100 MG tablet Take 1 tablet (100 mg total) by mouth daily. 04/06/16  Yes Isaiah Serge, NP  metFORMIN (GLUCOPHAGE-XR) 500 MG 24 hr tablet Take 4 tablets (2,000 mg total) by mouth daily. 11/06/15  Yes Renato Shin, MD  metoprolol (TOPROL-XL) 200 MG 24 hr tablet Take 1 tablet (200 mg total) by mouth daily. 12/08/15  Yes Renato Shin, MD  nitroGLYCERIN (NITROSTAT) 0.4 MG SL tablet Place 1 tablet (0.4 mg total) under the tongue every 5 (five) minutes x 3 doses as needed for chest pain. 07/25/13  Yes Luke K Kilroy, PA-C  potassium chloride SA (K-DUR,KLOR-CON) 20 MEQ tablet Take 2 tablets (40 mEq total) by mouth daily. 04/07/16  Yes Isaiah Serge, NP  pravastatin (PRAVACHOL) 40 MG tablet Take 1 tablet (40 mg total) by mouth daily. 12/08/15  Yes Renato Shin, MD  sitaGLIPtin (JANUVIA) 100 MG tablet Take 1 tablet (100 mg total) by mouth daily. 12/09/15  Yes Renato Shin, MD  warfarin (COUMADIN) 5 MG tablet pt takes 7.5mg  on Mondays and Fridays - takes 5mg  all other days 04/06/16  Yes Isaiah Serge, NP    Review of Systems    Since d/c he ha been doing well and denies  chest pain, palpitations, dyspnea, pnd, orthopnea, n, v, dizziness, syncope, edema, weight gain, or early satiety.   All other systems reviewed and are otherwise negative except as noted above.  Physical Exam    VS:  BP 108/76   Pulse 68   Ht 6\' 2"  (1.88 m)   Wt 298 lb 12.8 oz (135.5 kg)   SpO2 98%   BMI 38.36 kg/m  , BMI Body mass index is 38.36 kg/m. GEN: Well nourished, well developed, in no acute distress.  HEENT: normal.  Neck: Supple, no JVD, carotid bruits, or masses. Cardiac: RRR, 2/6 SEM RUSB w/ mech S2, no rubs, or gallops. No clubbing, cyanosis, edema.  Radials/DP/PT 2+ and equal bilaterally.  Respiratory:  Respirations regular and unlabored, clear to auscultation bilaterally. GI: Soft, nontender, nondistended, BS + x 4. MS: no deformity or atrophy. Skin: warm and dry, no rash. Neuro:  Strength and sensation are intact. Psych: Normal affect.  Accessory Clinical Findings  Lab Results  Component Value Date   CREATININE 1.38 (H) 04/06/2016   BUN 27 (H) 04/06/2016   NA 136 04/06/2016   K 4.4 04/06/2016   CL 103 04/06/2016   CO2 23 04/06/2016   Lab Results  Component Value Date   INR 3.0 04/13/2016   INR 2.84 04/06/2016   INR 2.69 04/05/2016   PROTIME 15.9 11/06/2008   PROTIME 15.6 10/23/2008    2D Echocardiogram  Procedures: ECHO 03/31/16 ------------------------------------------------------------------- Study Conclusions  - Left ventricle: The cavity size was mildly dilated. There was mild concentric hypertrophy. Systolic function was normal. The estimated ejection fraction was in the range of 55% to 60%. Wall motion was normal; there were no regional wall motion abnormalities. Features are consistent with a pseudonormal left ventricular filling pattern, with concomitant abnormal relaxation and increased filling pressure (grade 2 diastolic dysfunction). - Aortic valve: A mechanical prosthesis was present. Mean gradient (S): 19 mm Hg.  Valve area (VTI): 1.36 cm^2. Valve area (Vmax): 1.22 cm^2. Valve area (Vmean): 1.19 cm^2. - Left atrium: The atrium was moderately dilated.  Assessment & Plan    1.  Chronic diastolic congestive heart failure: Status post recent hospitalization for volume overload with 16 pound weight loss and that negative diuresis of 16.7 L. He has done well since discharge and has been monitoring his weight daily. Weight has been stable at 293-294 pounds. He is compliant with his medications and heart rate and blood pressure are well controlled today. He is currently on Lasix 80 mg daily and I will follow-up a basic metabolic panel to ensure stability.  We discussed the importance of daily weights, sodium restriction, medication compliance, and symptom reporting, and he verbalizes understanding. He will remain on beta blocker, ARB, Lasix, clonidine.  2. History of mechanical aortic valve replacement: In the setting of aortic dissection in 2000. Normal functioning valve on recent echo. He is on chronic Coumadin anticoagulation and followed in Coumadin clinic. INR was therapeutic today at 3.0.  3. Coronary artery disease: Status post bare-metal stenting of the vein graft to the PDA in 2015 in the setting of an occluded right coronary artery. He has not been having any chest pain and remains on beta blocker, ARB, and statin therapy. He is not on aspirin because he is on Coumadin.   4. Hypertensive heart disease: Blood pressure is stable today. Continue beta blocker, ARB, Lasix, and clonidine.  5. Hyperlipidemia: Last LDL that we have on file was from October 2015 and was 75. LFTs were normal earlier this year.  6. Type 2 diabetes mellitus: He is on Januvia, invokana, and metformin. This is followed by primary care.  7. Paroxysmal atrial fibrillation: Last noted during an ER visit in 2013. No recent palpitations. He remains on beta blocker. He is anticoagulated on Coumadin as above.  8. Disposition: Follow-up  basic metabolic panel today. Follow-up in clinic in one month or sooner if necessary.  Murray Hodgkins, NP 04/13/2016, 10:07 AM

## 2016-04-18 ENCOUNTER — Other Ambulatory Visit: Payer: Self-pay | Admitting: *Deleted

## 2016-04-18 MED ORDER — FUROSEMIDE 40 MG PO TABS: 60.0000 mg | ORAL_TABLET | Freq: Every day | ORAL | 3 refills | Status: DC

## 2016-04-18 NOTE — Telephone Encounter (Signed)
See recent BMET results.

## 2016-04-19 ENCOUNTER — Telehealth: Payer: Self-pay | Admitting: Cardiology

## 2016-04-19 NOTE — Telephone Encounter (Signed)
New Message  Pt c/o medication issue:  1. Name of Medication: furosemide  2. How are you currently taking this medication (dosage and times per day)? 40 mg tablet 60 mg total daily  3. Are you having a reaction (difficulty breathing--STAT)? No  4. What is your medication issue? Pt wants someone to call and verify with him his correct dosage intake.  Please f/u with pt

## 2016-04-19 NOTE — Telephone Encounter (Signed)
Spoke with pt wife, directions regarding new prescription discussed with wife.

## 2016-05-11 ENCOUNTER — Ambulatory Visit (INDEPENDENT_AMBULATORY_CARE_PROVIDER_SITE_OTHER): Payer: Medicare HMO | Admitting: *Deleted

## 2016-05-11 DIAGNOSIS — I480 Paroxysmal atrial fibrillation: Secondary | ICD-10-CM | POA: Diagnosis not present

## 2016-05-11 DIAGNOSIS — Z8679 Personal history of other diseases of the circulatory system: Secondary | ICD-10-CM

## 2016-05-11 DIAGNOSIS — Z5181 Encounter for therapeutic drug level monitoring: Secondary | ICD-10-CM

## 2016-05-11 LAB — POCT INR: INR: 2.5

## 2016-05-25 ENCOUNTER — Ambulatory Visit: Payer: Medicare HMO | Admitting: Nurse Practitioner

## 2016-06-09 ENCOUNTER — Ambulatory Visit: Payer: Medicare HMO | Admitting: Nurse Practitioner

## 2016-06-15 ENCOUNTER — Ambulatory Visit (INDEPENDENT_AMBULATORY_CARE_PROVIDER_SITE_OTHER): Payer: Medicare HMO | Admitting: Nurse Practitioner

## 2016-06-15 ENCOUNTER — Other Ambulatory Visit: Payer: Self-pay | Admitting: *Deleted

## 2016-06-15 ENCOUNTER — Ambulatory Visit (INDEPENDENT_AMBULATORY_CARE_PROVIDER_SITE_OTHER): Payer: Medicare HMO | Admitting: Pharmacist

## 2016-06-15 ENCOUNTER — Encounter: Payer: Self-pay | Admitting: Nurse Practitioner

## 2016-06-15 VITALS — BP 122/82 | HR 61 | Ht 74.0 in | Wt 306.0 lb

## 2016-06-15 DIAGNOSIS — I5032 Chronic diastolic (congestive) heart failure: Secondary | ICD-10-CM | POA: Diagnosis not present

## 2016-06-15 DIAGNOSIS — I2510 Atherosclerotic heart disease of native coronary artery without angina pectoris: Secondary | ICD-10-CM

## 2016-06-15 DIAGNOSIS — I110 Hypertensive heart disease with heart failure: Secondary | ICD-10-CM

## 2016-06-15 DIAGNOSIS — Z952 Presence of prosthetic heart valve: Secondary | ICD-10-CM

## 2016-06-15 DIAGNOSIS — Z5181 Encounter for therapeutic drug level monitoring: Secondary | ICD-10-CM

## 2016-06-15 DIAGNOSIS — I480 Paroxysmal atrial fibrillation: Secondary | ICD-10-CM

## 2016-06-15 DIAGNOSIS — E782 Mixed hyperlipidemia: Secondary | ICD-10-CM

## 2016-06-15 DIAGNOSIS — Z8679 Personal history of other diseases of the circulatory system: Secondary | ICD-10-CM

## 2016-06-15 LAB — BASIC METABOLIC PANEL
BUN: 18 mg/dL (ref 7–25)
CALCIUM: 9.3 mg/dL (ref 8.6–10.3)
CO2: 28 mmol/L (ref 20–31)
CREATININE: 1.14 mg/dL (ref 0.70–1.33)
Chloride: 102 mmol/L (ref 98–110)
GLUCOSE: 194 mg/dL — AB (ref 65–99)
Potassium: 4 mmol/L (ref 3.5–5.3)
SODIUM: 138 mmol/L (ref 135–146)

## 2016-06-15 LAB — POCT INR: INR: 2.8

## 2016-06-15 NOTE — Progress Notes (Signed)
Office Visit    Patient Name: [name] Date of Encounter: 06/15/2016  Primary Care Provider:  Cathlean Cower, MD Primary Cardiologist:  B. Stanford Breed, MD   Chief Complaint    51 y/o ? a h/o ascending aortic dissection status post bentall w/ AVR and VG  PDA, hypertension, hyperlipidemia, diabetes, paroxysmal atrial fibrillation, and diastolic heart failure who presents for office f/u.  Past Medical History    Past Medical History:  Diagnosis Date  . Ascending aortic dissection ([place])    a. 2000 - with aortic valve involvement. S/p repair of aortic dissection with placement of mechanical AVR.  [name] CAD (coronary artery disease)    a. 2000 VG->PDA @ time of Ao dissection repair;  b. Cardiac CT 06/2010: no CAD, only mild plaque in prox RCA;  c. 2015 Cath: LM nl, LAD nl, LCX nl, RCA 100, VG->PDA 90 (4.5x12 Rebel BMS).  . Cholelithiasis 08/22/2013  . Chronic diastolic CHF (congestive heart failure) ([place])    a. 03/2016 Echo: EF 55-60%, no rwma, Gr2 DD.  . Diabetes mellitus   . Dyspnea   . Gross hematuria   . H/O mechanical aortic valve replacement    a. 03/2016 Echo: AoV mean gradient 47mmHg, Valve Area (VTI) 1.36 cm^2, (Vmax) 1.22 cm^2, mod dil LA.  [name] Hyperlipidemia   . Hypertension   . NEPHROLITHIASIS, HX OF   . Obesity   . PAF (paroxysmal atrial fibrillation) ([place])    a. H/o such, with recurrence of coarse afib/flutter during ER consult 03/2012.  [name] TRANSIENT ISCHEMIC ATTACK, HX OF    a. In 2004.  [name] Unspecified vitamin D deficiency    Past Surgical History:  Procedure Laterality Date  . AORTIC VALVE REPLACEMENT  2000  . APPENDECTOMY  1998  . CORONARY ARTERY BYPASS GRAFT  2000  . LEFT AND RIGHT HEART CATHETERIZATION WITH CORONARY ANGIOGRAM N/A 07/19/2013   Procedure: LEFT AND RIGHT HEART CATHETERIZATION WITH CORONARY ANGIOGRAM;  Surgeon: [name], MD;  Location: [facility] CATH LAB;  Service: Cardiovascular;  Laterality: N/A;  . TEE WITHOUT CARDIOVERSION N/A 07/22/2013   Procedure: TRANSESOPHAGEAL ECHOCARDIOGRAM (TEE);  Surgeon: [name], MD;  Location[facility] ENDOSCOPY;  Service: Cardiovascular;  Laterality: N/A;    Allergies  Allergies  Allergen Reactions  . Insulin Glargine Swelling    edema    History of Present Illness    51 y/o ? with the above PMH including asc Ao dissection in 2000 s/p bentall w/ mech AVR and VG  PDA, HTN, HL, PAFib, DM II, obesity, and diast chf.  In 2015, he underwent cath 2/2 chest pain and was found to have an occluded RCA with a 90% stenosis in the VG  PDA.  This was treated with a BMS.  I last saw him in November following hospitalization for diast CHF.  He was diuresed down from 310 to 294 lbs.  Wt was stable @ f/u.  Creat was mildly elevated on repeat labs and he was advised to reduce lasix from 80 daily to 60 daily.  He thinks he was taking 60 @ one point but notes today that he is taking 80 daily (2 40 mg tabs).  He isn't sure when or why he went back to 80 mg.  His wts @ home have been in the upper 290's - generally 298 - 299.  He continues to watch salt closely and is usually preparing his own meals.  He works out 5 days/wk - often spending 3 hrs in the  gym using the ellipitical, stationary bike, light wts, and ending with a 1 mile walk around an indoor track.  He denies chest pain, palpitations, dyspnea, pnd, orthopnea, n, v, dizziness, syncope, edema, weight gain, or early satiety.   Home Medications    Prior to Admission medications   Medication Sig Start Date End Date Taking? Authorizing Provider  acarbose (PRECOSE) 25 MG tablet Take 1 tablet (25 mg total) by mouth 3 (three) times daily with meals. 11/06/15  Yes Renato Shin, MD  bromocriptine (PARLODEL) 2.5 MG tablet Take 2.5 mg by mouth daily. Take 1/4 tablet daily   Yes Historical Provider, MD  canagliflozin (INVOKANA) 100 MG TABS tablet Take 1 tablet (100 mg total) by mouth daily before breakfast. 01/19/16  Yes Renato Shin, MD  cholecalciferol (VITAMIN D) 1000  UNITS tablet Take 1,000 Units by mouth daily.   Yes Historical Provider, MD  cloNIDine (CATAPRES) 0.1 MG tablet Take 0.1 mg by mouth 2 (two) times daily.   Yes Historical Provider, MD  furosemide (LASIX) 40 MG tablet Take 1.5 tablets (60 mg total) by mouth daily. 04/18/16  Yes Rogelia Mire, NP  glimepiride (AMARYL) 4 MG tablet Take 1 tablet (4 mg total) by mouth daily before breakfast. 11/06/15  Yes Renato Shin, MD  levothyroxine (SYNTHROID, LEVOTHROID) 50 MCG tablet Take 1 tablet (50 mcg total) by mouth daily. 12/08/15  Yes Renato Shin, MD  losartan (COZAAR) 100 MG tablet Take 1 tablet (100 mg total) by mouth daily. 04/06/16  Yes Isaiah Serge, NP  metFORMIN (GLUCOPHAGE-XR) 500 MG 24 hr tablet Take 4 tablets (2,000 mg total) by mouth daily. 11/06/15  Yes Renato Shin, MD  metoprolol (TOPROL-XL) 200 MG 24 hr tablet Take 1 tablet (200 mg total) by mouth daily. 12/08/15  Yes Renato Shin, MD  nitroGLYCERIN (NITROSTAT) 0.4 MG SL tablet Place 1 tablet (0.4 mg total) under the tongue every 5 (five) minutes x 3 doses as needed for chest pain. 07/25/13  Yes Luke K Kilroy, PA-C  potassium chloride SA (K-DUR,KLOR-CON) 20 MEQ tablet Take 2 tablets (40 mEq total) by mouth daily. 04/07/16  Yes Isaiah Serge, NP  pravastatin (PRAVACHOL) 40 MG tablet Take 1 tablet (40 mg total) by mouth daily. 12/08/15  Yes Renato Shin, MD  sitaGLIPtin (JANUVIA) 100 MG tablet Take 1 tablet (100 mg total) by mouth daily. 12/09/15  Yes Renato Shin, MD  warfarin (COUMADIN) 5 MG tablet pt takes 7.5mg  on Mondays and Fridays - takes 5mg  all other days 04/06/16  Yes Isaiah Serge, NP    Review of Systems    He denies chest pain, palpitations, dyspnea, pnd, orthopnea, n, v, dizziness, syncope, edema, weight gain, or early satiety. .  All other systems reviewed and are otherwise negative except as noted above.  Physical Exam    VS:  BP 122/82   Pulse 61   Ht 6\' 2"  (1.88 m)   Wt (!) 306 lb (138.8 kg)   BMI 39.29 kg/m  , BMI  Body mass index is 39.29 kg/m. GEN: Well nourished, obese, in no acute distress.  HEENT: normal.  Neck: Supple, no JVD, carotid bruits, or masses. Cardiac: RRR, 2/6 SEM bilat USB with mech S2.  No rubs, or gallops. No clubbing, cyanosis, edema.  Radials/DP/PT 2+ and equal bilaterally.  Respiratory:  Respirations regular and unlabored, clear to auscultation bilaterally. GI: Soft, nontender, nondistended, BS + x 4. MS: no deformity or atrophy. Skin: warm and dry, no rash. Neuro:  Strength and sensation are  intact. Psych: Normal affect.  Accessory Clinical Findings    ECG - RSR, 61, LAD, LVH, nonspecific t changes.  Assessment & Plan    1.  Chronic diastolic CHF:  Overall doing well and remaining active.  Wt trends @ home have been higher than previous - was running 293-294 2 mos ago, now 298-299.  That said, he remains euvolemic with good exercise tolerance.  HR/BP well controlled.  He has been taking lasix 80 daily instead of 60 daily as prev Rx.  Creat was mildly elevated on that dose in Nov.  I will repeat bmet today.  Cont  blocker, ARB, clonidine.  2.  S/P Mech AVR:  INR rx today (seen in coumadin clinic).  3.  CAD:  S/p prior CABG and DES to VG  PDA in 2015.  No chest pain.  Cont med Rx including  blocker, statin, ARB.  No asa 2/2 chronic coumadin.  4.  Hypertensive Heart Dzs: well controlled on current meds.  5.  HL:  Cont statin therapy.  6.  DM II:  On januvia, invokana, and metformin per pcp.  7.  PAF:  Last noted in 2013.  Cont  blocker/coumadin.  8.  Dispo:  F/u creat today - may need to adjust lasix.  F/u w/ me or Dr. Stanford Breed in 3 mos or sooner if necessary.   Murray Hodgkins, NP 06/15/2016, 8:58 AM

## 2016-06-15 NOTE — Patient Instructions (Addendum)
Medication Instructions:  Your physician recommends that you continue on your current medications as directed. Please refer to the Current Medication list given to you today.   Labwork: TODAY:  BMET  Testing/Procedures: None ordered  Follow-Up: Your physician recommends that you schedule a follow-up appointment in: [place] DR. CRENSHAW OR CHRIS BERGE, NP   Any Other Special Instructions Will Be Listed Below (If Applicable).     If you need a refill on your cardiac medications before your next appointment, please call your pharmacy.

## 2016-06-16 ENCOUNTER — Other Ambulatory Visit: Payer: Self-pay | Admitting: *Deleted

## 2016-06-16 MED ORDER — FUROSEMIDE 40 MG PO TABS: 40.0000 mg | ORAL_TABLET | Freq: Two times a day (BID) | ORAL | 5 refills | Status: DC

## 2016-06-27 ENCOUNTER — Other Ambulatory Visit: Payer: Self-pay | Admitting: *Deleted

## 2016-06-27 DIAGNOSIS — I359 Nonrheumatic aortic valve disorder, unspecified: Secondary | ICD-10-CM

## 2016-07-11 ENCOUNTER — Telehealth (HOSPITAL_COMMUNITY): Payer: Self-pay | Admitting: Cardiology

## 2016-07-11 NOTE — Telephone Encounter (Signed)
Called pt and lmsg informing him that he did not need to come for his echo per Dr. Stanford Breed, he spoke with [facility] echo tech.

## 2016-07-13 ENCOUNTER — Other Ambulatory Visit (HOSPITAL_COMMUNITY): Payer: Medicare HMO

## 2016-07-13 ENCOUNTER — Ambulatory Visit (INDEPENDENT_AMBULATORY_CARE_PROVIDER_SITE_OTHER): Payer: Medicare HMO | Admitting: *Deleted

## 2016-07-13 DIAGNOSIS — Z5181 Encounter for therapeutic drug level monitoring: Secondary | ICD-10-CM

## 2016-07-13 DIAGNOSIS — Z8679 Personal history of other diseases of the circulatory system: Secondary | ICD-10-CM | POA: Diagnosis not present

## 2016-07-13 DIAGNOSIS — I480 Paroxysmal atrial fibrillation: Secondary | ICD-10-CM

## 2016-07-13 LAB — POCT INR: INR: 2.4

## 2016-07-26 ENCOUNTER — Other Ambulatory Visit: Payer: Self-pay | Admitting: Internal Medicine

## 2016-07-28 ENCOUNTER — Other Ambulatory Visit: Payer: Self-pay

## 2016-07-28 MED ORDER — FUROSEMIDE 40 MG PO TABS: 40.0000 mg | ORAL_TABLET | Freq: Two times a day (BID) | ORAL | 1 refills | Status: DC

## 2016-08-01 ENCOUNTER — Other Ambulatory Visit: Payer: Self-pay

## 2016-08-01 MED ORDER — FUROSEMIDE 40 MG PO TABS: 40.0000 mg | ORAL_TABLET | Freq: Two times a day (BID) | ORAL | 2 refills | Status: DC

## 2016-08-10 ENCOUNTER — Other Ambulatory Visit: Payer: Self-pay

## 2016-08-10 MED ORDER — FUROSEMIDE 40 MG PO TABS: 40.0000 mg | ORAL_TABLET | Freq: Two times a day (BID) | ORAL | 3 refills | Status: DC

## 2016-08-12 ENCOUNTER — Telehealth: Payer: Self-pay | Admitting: Internal Medicine

## 2016-08-12 NOTE — Telephone Encounter (Signed)
I haven't called. Maybe a different office?

## 2016-08-12 NOTE — Telephone Encounter (Signed)
Patient states he missed two calls from this office.  I did not see note in chart.  Did any of you call patient?  Patient believes could be in regard to medication refills?

## 2016-08-12 NOTE — Telephone Encounter (Signed)
No... I have not call...Johny Chess

## 2016-08-17 ENCOUNTER — Telehealth: Payer: Self-pay | Admitting: Interventional Cardiology

## 2016-08-17 NOTE — Telephone Encounter (Signed)
New Message     1. What dental office are you calling from? Dr Vale Haven   2. What is your office phone and fax number?  [phone] fax [phone]  3. What type of procedure is the patient having performed? Xray and cleaning   4. What date is procedure scheduled? today  5. What is your question (ex. Antibiotics prior to procedure, holding medication-we need to know how long dentist wants pt to hold med)? Does he need pre med for stents ,

## 2016-08-17 NOTE — Telephone Encounter (Signed)
This is not my patient. Appears he belongs to Dr. Stanford Breed

## 2016-08-17 NOTE — Telephone Encounter (Signed)
Pt has a mechanical AVR on coumadin. Pt will have deep dental cleaning on 09/01/16 then a dental filling at later day with a crown. Dentist wants to know does pt needs antibiotics prior the procedures and how long pt needs to stop the coumadin.

## 2016-08-17 NOTE — Telephone Encounter (Signed)
Spoke with [name] at Dr [facility] . [name] was aware that pt had a prosthetic AVR. [name] wants  to know instructions of how to give the per op antibiotics. Last office visit with the diagnosis of S/P prosthetic mechanic AVR. Also copy of the conditions of a high risk of heart for  Endocarditis, sent to Fax # [phone] att to [facility].

## 2016-08-17 NOTE — Telephone Encounter (Signed)
New Message    New Message     1. What dental office are you calling from? Dr Vale Haven   2. What is your office phone and fax number?  [phone] fax [phone]  3. What type of procedure is the patient having performed? Deep cleaning   4. What date is procedure scheduled? 09/01/16, then a filling at a later day, and a crown   5. What is your question (ex. Antibiotics prior to procedure, holding medication-we need to know how long dentist wants pt to hold med)? Does he need pre meds and how long does he need to stop the coumadin ??

## 2016-08-18 NOTE — Telephone Encounter (Signed)
Pt needs SBE prophylaxis; he does not need to hold coumadin prior to dental cleaning Kirk Ruths

## 2016-08-19 NOTE — Telephone Encounter (Signed)
Will fax this note to the number provided. 

## 2016-08-29 ENCOUNTER — Ambulatory Visit (INDEPENDENT_AMBULATORY_CARE_PROVIDER_SITE_OTHER): Payer: Medicare HMO | Admitting: *Deleted

## 2016-08-29 DIAGNOSIS — Z8679 Personal history of other diseases of the circulatory system: Secondary | ICD-10-CM

## 2016-08-29 DIAGNOSIS — Z5181 Encounter for therapeutic drug level monitoring: Secondary | ICD-10-CM

## 2016-08-29 DIAGNOSIS — I480 Paroxysmal atrial fibrillation: Secondary | ICD-10-CM | POA: Diagnosis not present

## 2016-08-29 LAB — POCT INR: INR: 2.2

## 2016-10-10 ENCOUNTER — Ambulatory Visit (INDEPENDENT_AMBULATORY_CARE_PROVIDER_SITE_OTHER): Payer: Medicare HMO

## 2016-10-10 DIAGNOSIS — Z5181 Encounter for therapeutic drug level monitoring: Secondary | ICD-10-CM

## 2016-10-10 DIAGNOSIS — I480 Paroxysmal atrial fibrillation: Secondary | ICD-10-CM | POA: Diagnosis not present

## 2016-10-10 LAB — POCT INR: INR: 3

## 2016-10-31 ENCOUNTER — Other Ambulatory Visit: Payer: Self-pay | Admitting: Internal Medicine

## 2016-11-21 ENCOUNTER — Ambulatory Visit (INDEPENDENT_AMBULATORY_CARE_PROVIDER_SITE_OTHER): Payer: Medicare HMO | Admitting: *Deleted

## 2016-11-21 DIAGNOSIS — Z8679 Personal history of other diseases of the circulatory system: Secondary | ICD-10-CM | POA: Diagnosis not present

## 2016-11-21 DIAGNOSIS — Z5181 Encounter for therapeutic drug level monitoring: Secondary | ICD-10-CM | POA: Diagnosis not present

## 2016-11-21 DIAGNOSIS — I480 Paroxysmal atrial fibrillation: Secondary | ICD-10-CM

## 2016-11-21 LAB — POCT INR: INR: 1.6

## 2016-12-05 ENCOUNTER — Ambulatory Visit (INDEPENDENT_AMBULATORY_CARE_PROVIDER_SITE_OTHER): Payer: Medicare HMO

## 2016-12-05 DIAGNOSIS — Z5181 Encounter for therapeutic drug level monitoring: Secondary | ICD-10-CM | POA: Diagnosis not present

## 2016-12-05 DIAGNOSIS — I480 Paroxysmal atrial fibrillation: Secondary | ICD-10-CM | POA: Diagnosis not present

## 2016-12-05 DIAGNOSIS — Z8679 Personal history of other diseases of the circulatory system: Secondary | ICD-10-CM

## 2016-12-05 LAB — POCT INR: INR: 2

## 2016-12-07 ENCOUNTER — Other Ambulatory Visit: Payer: Self-pay | Admitting: Internal Medicine

## 2016-12-26 ENCOUNTER — Ambulatory Visit (INDEPENDENT_AMBULATORY_CARE_PROVIDER_SITE_OTHER): Payer: Medicare HMO

## 2016-12-26 DIAGNOSIS — Z8679 Personal history of other diseases of the circulatory system: Secondary | ICD-10-CM

## 2016-12-26 DIAGNOSIS — I480 Paroxysmal atrial fibrillation: Secondary | ICD-10-CM

## 2016-12-26 DIAGNOSIS — Z5181 Encounter for therapeutic drug level monitoring: Secondary | ICD-10-CM | POA: Diagnosis not present

## 2016-12-26 LAB — POCT INR: INR: 1.5

## 2017-02-02 ENCOUNTER — Ambulatory Visit (INDEPENDENT_AMBULATORY_CARE_PROVIDER_SITE_OTHER): Payer: Medicare HMO | Admitting: Pharmacist

## 2017-02-02 DIAGNOSIS — I480 Paroxysmal atrial fibrillation: Secondary | ICD-10-CM

## 2017-02-02 DIAGNOSIS — Z8679 Personal history of other diseases of the circulatory system: Secondary | ICD-10-CM

## 2017-02-02 DIAGNOSIS — Z5181 Encounter for therapeutic drug level monitoring: Secondary | ICD-10-CM | POA: Diagnosis not present

## 2017-02-02 LAB — POCT INR: INR: 3.4

## 2017-02-13 ENCOUNTER — Other Ambulatory Visit: Payer: Self-pay | Admitting: Internal Medicine

## 2017-02-16 ENCOUNTER — Ambulatory Visit (INDEPENDENT_AMBULATORY_CARE_PROVIDER_SITE_OTHER): Payer: Medicare HMO | Admitting: *Deleted

## 2017-02-16 DIAGNOSIS — Z8679 Personal history of other diseases of the circulatory system: Secondary | ICD-10-CM

## 2017-02-16 DIAGNOSIS — Z5181 Encounter for therapeutic drug level monitoring: Secondary | ICD-10-CM | POA: Diagnosis not present

## 2017-02-16 DIAGNOSIS — I480 Paroxysmal atrial fibrillation: Secondary | ICD-10-CM | POA: Diagnosis not present

## 2017-02-16 LAB — POCT INR: INR: 1.5

## 2017-02-28 ENCOUNTER — Encounter: Payer: Self-pay | Admitting: Internal Medicine

## 2017-02-28 ENCOUNTER — Ambulatory Visit (INDEPENDENT_AMBULATORY_CARE_PROVIDER_SITE_OTHER): Payer: Medicare HMO | Admitting: Internal Medicine

## 2017-02-28 ENCOUNTER — Other Ambulatory Visit (INDEPENDENT_AMBULATORY_CARE_PROVIDER_SITE_OTHER): Payer: Medicare HMO

## 2017-02-28 VITALS — BP 136/88 | HR 75 | Temp 98.3°F | Ht 74.0 in | Wt 297.0 lb

## 2017-02-28 DIAGNOSIS — H538 Other visual disturbances: Secondary | ICD-10-CM

## 2017-02-28 DIAGNOSIS — Z0000 Encounter for general adult medical examination without abnormal findings: Secondary | ICD-10-CM | POA: Diagnosis not present

## 2017-02-28 DIAGNOSIS — Z0001 Encounter for general adult medical examination with abnormal findings: Secondary | ICD-10-CM | POA: Diagnosis not present

## 2017-02-28 DIAGNOSIS — E119 Type 2 diabetes mellitus without complications: Secondary | ICD-10-CM

## 2017-02-28 DIAGNOSIS — Z23 Encounter for immunization: Secondary | ICD-10-CM | POA: Diagnosis not present

## 2017-02-28 LAB — LIPID PANEL
Cholesterol: 168 mg/dL (ref 0–200)
HDL: 36.9 mg/dL — ABNORMAL LOW (ref >39.00)
NONHDL: 130.86
Total CHOL/HDL Ratio: 5
Triglycerides: 261 mg/dL — ABNORMAL HIGH (ref 0.0–149.0)
VLDL: 52.2 mg/dL — AB (ref 0.0–40.0)

## 2017-02-28 LAB — BASIC METABOLIC PANEL
BUN: 16 mg/dL (ref 6–23)
CHLORIDE: 99 meq/L (ref 96–112)
CO2: 30 mEq/L (ref 19–32)
Calcium: 9.5 mg/dL (ref 8.4–10.5)
Creatinine, Ser: 1.17 mg/dL (ref 0.40–1.50)
GFR: 84.17 mL/min (ref >60.00)
GLUCOSE: 298 mg/dL — AB (ref 70–99)
POTASSIUM: 3.8 meq/L (ref 3.5–5.1)
SODIUM: 136 meq/L (ref 135–145)

## 2017-02-28 LAB — URINALYSIS, ROUTINE W REFLEX MICROSCOPIC
Bilirubin Urine: NEGATIVE
HGB URINE DIPSTICK: NEGATIVE
Ketones, ur: NEGATIVE
Leukocytes, UA: NEGATIVE
NITRITE: NEGATIVE
PH: 6 (ref 5.0–8.0)
RBC / HPF: NONE SEEN (ref 0–?)
SPECIFIC GRAVITY, URINE: 1.015 (ref 1.000–1.030)
TOTAL PROTEIN, URINE-UPE24: NEGATIVE
Urine Glucose: >=1000 — AB
Urobilinogen, UA: 0.2 (ref 0.0–1.0)
WBC, UA: NONE SEEN (ref 0–?)

## 2017-02-28 LAB — CBC WITH DIFFERENTIAL/PLATELET
BASOS PCT: 0.7 % (ref 0.0–3.0)
Basophils Absolute: 0 10*3/uL (ref 0.0–0.1)
EOS ABS: 0.2 10*3/uL (ref 0.0–0.7)
EOS PCT: 4.3 % (ref 0.0–5.0)
HCT: 41.1 % (ref 39.0–52.0)
Hemoglobin: 13.9 g/dL (ref 13.0–17.0)
LYMPHS ABS: 1.6 10*3/uL (ref 0.7–4.0)
Lymphocytes Relative: 27.8 % (ref 12.0–46.0)
MCHC: 33.8 g/dL (ref 30.0–36.0)
MCV: 84.1 fl (ref 78.0–100.0)
MONO ABS: 0.6 10*3/uL (ref 0.1–1.0)
Monocytes Relative: 10.3 % (ref 3.0–12.0)
NEUTROS PCT: 56.9 % (ref 43.0–77.0)
Neutro Abs: 3.2 10*3/uL (ref 1.4–7.7)
PLATELETS: 162 10*3/uL (ref 150.0–400.0)
RBC: 4.88 Mil/uL (ref 4.22–5.81)
RDW: 16.4 % — AB (ref 11.5–15.5)
WBC: 5.7 10*3/uL (ref 4.0–10.5)

## 2017-02-28 LAB — TSH: TSH: 4.61 u[IU]/mL — AB (ref 0.35–4.50)

## 2017-02-28 LAB — HEPATIC FUNCTION PANEL
ALK PHOS: 64 U/L (ref 39–117)
ALT: 9 U/L (ref 0–53)
AST: 17 U/L (ref 0–37)
Albumin: 4.1 g/dL (ref 3.5–5.2)
BILIRUBIN TOTAL: 0.7 mg/dL (ref 0.2–1.2)
Bilirubin, Direct: 0.1 mg/dL (ref 0.0–0.3)
Total Protein: 8 g/dL (ref 6.0–8.3)

## 2017-02-28 LAB — LDL CHOLESTEROL, DIRECT: Direct LDL: 88 mg/dL

## 2017-02-28 LAB — MICROALBUMIN / CREATININE URINE RATIO
CREATININE, U: 157.7 mg/dL
MICROALB UR: 6.6 mg/dL — AB (ref 0.0–1.9)
Microalb Creat Ratio: 4.2 mg/g (ref 0.0–30.0)

## 2017-02-28 LAB — HEMOGLOBIN A1C: Hgb A1c MFr Bld: 10.4 % — ABNORMAL HIGH (ref 4.6–6.5)

## 2017-02-28 MED ORDER — PNEUMOCOCCAL 13-VAL CONJ VACC IM SUSP: 0.5000 mL | INTRAMUSCULAR | Status: DC

## 2017-02-28 NOTE — Assessment & Plan Note (Signed)

## 2017-02-28 NOTE — Assessment & Plan Note (Signed)
stable overall by history and exam, recent data reviewed with pt, and pt to continue medical treatment as before,  to f/u any worsening symptoms or concerns Lab Results  Component Value Date   HGBA1C 8.3 01/19/2016  due for f/u lab today and f/u in 6 mo

## 2017-02-28 NOTE — Progress Notes (Signed)
Subjective:    Patient ID: [name], male    DOB: 05-Jul-1965, 51 y.o.   MRN: [id number]  HPI  Here for wellness and f/u;  Overall doing ok;  Pt denies Chest pain, worsening SOB, DOE, wheezing, orthopnea, PND, worsening LE edema, palpitations, dizziness or syncope.  Pt denies neurological change such as new headache, facial or extremity weakness.  Pt denies polydipsia, polyuria, or low sugar symptoms. Pt states overall good compliance with treatment and medications, good tolerability, and has been trying to follow appropriate diet.  Pt denies worsening depressive symptoms, suicidal ideation or panic. No fever, night sweats, wt loss, loss of appetite, or other constitutional symptoms.  Pt states good ability with ADL's, has low fall risk, home safety reviewed and adequate, no other significant changes in hearing, and only occasionally active with exercise. No new complaints except for up close reading is now blurred recently, better with reading glasses, but has not had optho appt in 2 yrs.   Wt has been recently as low as 282 per pt, has still overall lost wt but states he needs to get back to being more active since he slowed down recently Wt Readings from Last 3 Encounters:  02/28/17 297 lb (134.7 kg)  06/15/16 (!) 306 lb (138.8 kg)  04/13/16 298 lb 12.8 oz (135.5 kg)  Mother has new recent glaucoma.  Did the cologuard 2017 - neg per pt.  Dclines flu shot. Past Medical History:  Diagnosis Date  . Ascending aortic dissection ([place])    a. 2000 - with aortic valve involvement. S/p repair of aortic dissection with placement of mechanical AVR.  [name] CAD (coronary artery disease)    a. 2000 VG->PDA @ time of Ao dissection repair;  b. Cardiac CT 06/2010: no CAD, only mild plaque in prox RCA;  c. 2015 Cath: LM nl, LAD nl, LCX nl, RCA 100, VG->PDA 90 (4.5x12 Rebel BMS).  . Cholelithiasis 08/22/2013  . Chronic diastolic CHF (congestive heart failure) ([place])    a. 03/2016 Echo: EF 55-60%, no rwma, Gr2 DD.    . Diabetes mellitus   . Dyspnea   . Gross hematuria   . H/O mechanical aortic valve replacement    a. 03/2016 Echo: AoV mean gradient 57mmHg, Valve Area (VTI) 1.36 cm^2, (Vmax) 1.22 cm^2, mod dil LA.  [name] Hyperlipidemia   . Hypertension   . NEPHROLITHIASIS, HX OF   . Obesity   . PAF (paroxysmal atrial fibrillation) ([place])    a. H/o such, with recurrence of coarse afib/flutter during ER consult 03/2012.  [name] TRANSIENT ISCHEMIC ATTACK, HX OF    a. In 2004.  [name] Unspecified vitamin D deficiency    Past Surgical History:  Procedure Laterality Date  . AORTIC VALVE REPLACEMENT  2000  . APPENDECTOMY  1998  . CORONARY ARTERY BYPASS GRAFT  2000  . LEFT AND RIGHT HEART CATHETERIZATION WITH CORONARY ANGIOGRAM N/A 07/19/2013   Procedure: LEFT AND RIGHT HEART CATHETERIZATION WITH CORONARY ANGIOGRAM;  Surgeon: [name], MD;  Location: [facility] CATH LAB;  Service: Cardiovascular;  Laterality: N/A;  . TEE WITHOUT CARDIOVERSION N/A 07/22/2013   Procedure: TRANSESOPHAGEAL ECHOCARDIOGRAM (TEE);  Surgeon: [name], MD;  Location: [facility] ENDOSCOPY;  Service: Cardiovascular;  Laterality: N/A;    reports that he has never smoked. He has never used smokeless tobacco. He reports that he does not drink alcohol or use drugs. family history includes Coronary artery disease in his mother; Coronary artery disease (age of onset: 49) in his other; Diabetes  in his brother; Hypertension in his mother. Allergies  Allergen Reactions  . Insulin Glargine Swelling    edema   Current Outpatient Prescriptions on File Prior to Visit  Medication Sig Dispense Refill  . acarbose (PRECOSE) 25 MG tablet Take 1 tablet (25 mg total) by mouth 3 (three) times daily with meals. 270 tablet 3  . bromocriptine (PARLODEL) 2.5 MG tablet Take 2.5 mg by mouth daily. Take 1/4 tablet daily    . canagliflozin (INVOKANA) 100 MG TABS tablet Take 1 tablet (100 mg total) by mouth daily before breakfast. 30 tablet 11  . cholecalciferol (VITAMIN  D) 1000 UNITS tablet Take 1,000 Units by mouth daily.    . cloNIDine (CATAPRES) 0.1 MG tablet TAKE 1 TABLET TWICE DAILY 180 tablet 2  . furosemide (LASIX) 40 MG tablet Take 1 tablet (40 mg total) by mouth 2 (two) times daily. 180 tablet 3  . glimepiride (AMARYL) 1 MG tablet TAKE 3 TABLETS ONE TIME DAILY WITH BREAKFAST 270 tablet 2  . glimepiride (AMARYL) 4 MG tablet Take 1 tablet (4 mg total) by mouth daily before breakfast. 90 tablet 3  . KLOR-CON M20 20 MEQ tablet TAKE 2 TABLETS EVERY DAY 180 tablet 0  . levothyroxine (SYNTHROID, LEVOTHROID) 50 MCG tablet TAKE 1 TABLET EVERY DAY 90 tablet 2  . losartan (COZAAR) 100 MG tablet TAKE 1 TABLET EVERY DAY 90 tablet 1  . metFORMIN (GLUCOPHAGE) 1000 MG tablet TAKE 1 TABLET TWICE DAILY WITH MEALS 180 tablet 0  . metFORMIN (GLUCOPHAGE-XR) 500 MG 24 hr tablet Take 4 tablets (2,000 mg total) by mouth daily. 360 tablet 3  . metoprolol (TOPROL-XL) 200 MG 24 hr tablet Take 1 tablet (200 mg total) by mouth daily. 90 tablet 3  . nitroGLYCERIN (NITROSTAT) 0.4 MG SL tablet Place 1 tablet (0.4 mg total) under the tongue every 5 (five) minutes x 3 doses as needed for chest pain. 25 tablet 2  . Potassium Chloride ER 20 MEQ TBCR TAKE 2 TABLETS EVERY DAY 180 tablet 0  . pravastatin (PRAVACHOL) 40 MG tablet TAKE 1 TABLET EVERY DAY 90 tablet 1  . sitaGLIPtin (JANUVIA) 100 MG tablet Take 1 tablet (100 mg total) by mouth daily. 30 tablet 11  . warfarin (COUMADIN) 5 MG tablet TAKE AS DIRECTED BY COUMADIN CLINIC 135 tablet 1   No current facility-administered medications on file prior to visit.    Review of Systems Constitutional: Negative for other unusual diaphoresis, sweats, appetite or weight changes HENT: Negative for other worsening hearing loss, ear pain, facial swelling, mouth sores or neck stiffness.   Eyes: Negative for other worsening pain, redness or other visual disturbance.  Respiratory: Negative for other stridor or swelling Cardiovascular: Negative for  other palpitations or other chest pain  Gastrointestinal: Negative for worsening diarrhea or loose stools, blood in stool, distention or other pain Genitourinary: Negative for hematuria, flank pain or other change in urine volume.  Musculoskeletal: Negative for myalgias or other joint swelling.  Skin: Negative for other color change, or other wound or worsening drainage.  Neurological: Negative for other syncope or numbness. Hematological: Negative for other adenopathy or swelling Psychiatric/Behavioral: Negative for hallucinations, other worsening agitation, SI, self-injury, or new decreased concentration All other system neg per pt    Objective:   Physical Exam BP 136/88   Pulse 75   Temp 98.3 F (36.8 C) (Oral)   Ht 6\' 2"  (1.88 m)   Wt 297 lb (134.7 kg)   SpO2 99%   BMI 38.13 kg/m  VS noted,  Constitutional: Pt is oriented to person, place, and time. Appears well-developed and well-nourished, in no significant distress and comfortable Head: Normocephalic and atraumatic  Eyes: Conjunctivae and EOM are normal. Pupils are equal, round, and reactive to light Right Ear: External ear normal without discharge Left Ear: External ear normal without discharge Nose: Nose without discharge or deformity Mouth/Throat: Oropharynx is without other ulcerations and moist  Neck: Normal range of motion. Neck supple. No JVD present. No tracheal deviation present or significant neck LA or mass Cardiovascular: Normal rate, regular rhythm, normal heart sounds and intact distal pulses.   Pulmonary/Chest: WOB normal and breath sounds without rales or wheezing  Abdominal: Soft. Bowel sounds are normal. NT. No HSM  Musculoskeletal: Normal range of motion. Exhibits no edema Lymphadenopathy: Has no other cervical adenopathy.  Neurological: Pt is alert and oriented to person, place, and time. Pt has normal reflexes. No cranial nerve deficit. Motor grossly intact, Gait intact Skin: Skin is warm and dry. No  rash noted or new ulcerations Psychiatric:  Has normal mood and affect. Behavior is normal without agitation No other exam findings Lab Results  Component Value Date   WBC 6.7 04/06/2016   HGB 15.9 04/06/2016   HCT 44.6 04/06/2016   PLT 167 04/06/2016   GLUCOSE 194 (H) 06/15/2016   CHOL 137 02/25/2016   TRIG 210.0 (H) 02/25/2016   HDL 32.40 (L) 02/25/2016   LDLDIRECT 61.0 02/25/2016   LDLCALC 75 02/21/2014   ALT 18 02/25/2016   AST 20 02/25/2016   NA 138 06/15/2016   K 4.0 06/15/2016   CL 102 06/15/2016   CREATININE 1.14 06/15/2016   BUN 18 06/15/2016   CO2 28 06/15/2016   TSH 2.49 02/25/2016   PSA 0.68 02/25/2016   INR 1.5 02/16/2017   HGBA1C 8.3 01/19/2016   MICROALBUR 6.1 (H) 08/26/2014          Assessment & Plan:

## 2017-02-28 NOTE — Patient Instructions (Addendum)
You will be contacted regarding the referral for: Eye doctor  You had the Prevnar 13 pneumonia shot today  Please continue all other medications as before, and refills have been done if requested.  Please have the pharmacy call with any other refills you may need.  Please continue your efforts at being more active, low cholesterol diet, and weight control.  You are otherwise up to date with prevention measures today.  Please keep your appointments with your specialists as you may have planned  Please go to the LAB in the Basement (turn left off the elevator) for the tests to be done today  You will be contacted by phone if any changes need to be made immediately.  Otherwise, you will receive a letter about your results with an explanation, but please check with MyChart first.  You will be contacted by phone if any changes need to be made immediately.  Otherwise, you will receive a letter about your results with an explanation, but please check with MyChart first.  Please remember to sign up for MyChart if you have not done so, as this will be important to you in the future with finding out test results, communicating by private email, and scheduling acute appointments online when needed.  Please return in 6 months, or sooner if needed, with Lab testing done 3-5 days before

## 2017-03-01 ENCOUNTER — Ambulatory Visit (INDEPENDENT_AMBULATORY_CARE_PROVIDER_SITE_OTHER): Payer: Medicare HMO

## 2017-03-01 DIAGNOSIS — I480 Paroxysmal atrial fibrillation: Secondary | ICD-10-CM | POA: Diagnosis not present

## 2017-03-01 DIAGNOSIS — Z8679 Personal history of other diseases of the circulatory system: Secondary | ICD-10-CM

## 2017-03-01 DIAGNOSIS — Z5181 Encounter for therapeutic drug level monitoring: Secondary | ICD-10-CM

## 2017-03-01 LAB — PROTIME-INR
INR: 5 — ABNORMAL HIGH (ref 0.8–1.2)
Prothrombin Time: 53 s — ABNORMAL HIGH (ref 9.1–12.0)

## 2017-03-01 LAB — POCT INR: INR: 6.7

## 2017-03-02 ENCOUNTER — Other Ambulatory Visit: Payer: Self-pay | Admitting: Internal Medicine

## 2017-03-02 ENCOUNTER — Telehealth: Payer: Self-pay

## 2017-03-02 DIAGNOSIS — E119 Type 2 diabetes mellitus without complications: Secondary | ICD-10-CM

## 2017-03-02 NOTE — Telephone Encounter (Signed)
Called pt, LVM.   

## 2017-03-02 NOTE — Telephone Encounter (Signed)
-----   Message from Biagio Borg, MD sent at 03/02/2017 12:14 PM EDT ----- Left message on MyChart, pt to cont same tx except  The test results show that your current treatment is OK, except the A1c is VERY high.  You were doing better before when you were seeing Dr Loanne Drilling from Endocrinology.  You are taking multple medications in the pills that are not working well.  We need to refer you back to Dr Loanne Drilling for better control, and I suspect you may need to start insulin.    Cynthia Cogle to please inform pt, I will do referral

## 2017-03-10 ENCOUNTER — Ambulatory Visit (INDEPENDENT_AMBULATORY_CARE_PROVIDER_SITE_OTHER): Payer: Medicare HMO | Admitting: *Deleted

## 2017-03-10 DIAGNOSIS — Z5181 Encounter for therapeutic drug level monitoring: Secondary | ICD-10-CM

## 2017-03-10 DIAGNOSIS — I480 Paroxysmal atrial fibrillation: Secondary | ICD-10-CM | POA: Diagnosis not present

## 2017-03-10 DIAGNOSIS — Z8679 Personal history of other diseases of the circulatory system: Secondary | ICD-10-CM | POA: Diagnosis not present

## 2017-03-10 LAB — POCT INR: INR: 4

## 2017-03-13 ENCOUNTER — Other Ambulatory Visit: Payer: Self-pay | Admitting: Internal Medicine

## 2017-03-30 ENCOUNTER — Ambulatory Visit: Payer: Medicare HMO | Admitting: Endocrinology

## 2017-03-30 DIAGNOSIS — Z0289 Encounter for other administrative examinations: Secondary | ICD-10-CM

## 2017-04-03 ENCOUNTER — Ambulatory Visit (INDEPENDENT_AMBULATORY_CARE_PROVIDER_SITE_OTHER): Payer: Medicare HMO | Admitting: Pharmacist

## 2017-04-03 DIAGNOSIS — Z5181 Encounter for therapeutic drug level monitoring: Secondary | ICD-10-CM | POA: Diagnosis not present

## 2017-04-03 DIAGNOSIS — Z8679 Personal history of other diseases of the circulatory system: Secondary | ICD-10-CM

## 2017-04-03 DIAGNOSIS — I480 Paroxysmal atrial fibrillation: Secondary | ICD-10-CM

## 2017-04-03 LAB — POCT INR: INR: 2.4

## 2017-04-03 NOTE — Progress Notes (Signed)
Continue taking 1 tablet daily except 1.5 tablets only on Mondays,Wednesday, and Fridays.  Recheck INR in 4 weeks .

## 2017-05-05 ENCOUNTER — Ambulatory Visit (INDEPENDENT_AMBULATORY_CARE_PROVIDER_SITE_OTHER): Payer: Medicare HMO | Admitting: *Deleted

## 2017-05-05 DIAGNOSIS — I480 Paroxysmal atrial fibrillation: Secondary | ICD-10-CM

## 2017-05-05 DIAGNOSIS — Z8679 Personal history of other diseases of the circulatory system: Secondary | ICD-10-CM | POA: Diagnosis not present

## 2017-05-05 DIAGNOSIS — Z5181 Encounter for therapeutic drug level monitoring: Secondary | ICD-10-CM | POA: Diagnosis not present

## 2017-05-05 LAB — POCT INR: INR: 3

## 2017-05-05 NOTE — Patient Instructions (Addendum)
Description   Continue taking 1 tablet daily except 1.5 tablets only on Mondays,Wednesday, and Fridays.  Recheck INR in 4 weeks .  Call us with any new medications or concerns [phone] [facility] [phone]. Keep intake of greens consistent at 3 servings a week

## 2017-06-02 ENCOUNTER — Ambulatory Visit (INDEPENDENT_AMBULATORY_CARE_PROVIDER_SITE_OTHER): Payer: Medicare HMO | Admitting: *Deleted

## 2017-06-02 DIAGNOSIS — Z5181 Encounter for therapeutic drug level monitoring: Secondary | ICD-10-CM | POA: Diagnosis not present

## 2017-06-02 DIAGNOSIS — I480 Paroxysmal atrial fibrillation: Secondary | ICD-10-CM | POA: Diagnosis not present

## 2017-06-02 DIAGNOSIS — Z8679 Personal history of other diseases of the circulatory system: Secondary | ICD-10-CM

## 2017-06-02 LAB — POCT INR: INR: 2.9

## 2017-06-02 NOTE — Patient Instructions (Addendum)
  Description   Continue taking 1 tablet daily except 1.5 tablets only on Mondays,Wednesday, and Fridays.  Recheck INR in 5 weeks .  Call us with any new medications or concerns [phone] [facility] [phone].

## 2017-06-21 ENCOUNTER — Telehealth: Payer: Self-pay | Admitting: Internal Medicine

## 2017-06-21 MED ORDER — WARFARIN SODIUM 5 MG PO TABS: ORAL_TABLET | ORAL | 1 refills | Status: DC

## 2017-06-21 NOTE — Telephone Encounter (Signed)
Copied from [place] [id number] [phone]. Topic: Quick Communication - Rx Refill/Question >> Jun 21, 2017  1:05 PM Celedonio Savage L wrote: Medication:  warfarin (COUMADIN) 5 MG tablet   Has the patient contacted their pharmacy? Yes.  They told him to contact his provider for more refills   (Agent: If no, request that the patient contact the pharmacy for the refill.)   Preferred Pharmacy (with phone number or street name): [facility] [address] [place], [place] [phone] (Phone) [phone] (Fax)     Agent: Please be advised that RX refills may take up to 3 business days. We ask that you follow-up with your pharmacy.

## 2017-06-21 NOTE — Telephone Encounter (Signed)
Pt goes to the coumadin clinic @ cardiology. Forwarding refill to them.Marland KitchenJohny [name]

## 2017-07-07 ENCOUNTER — Ambulatory Visit (INDEPENDENT_AMBULATORY_CARE_PROVIDER_SITE_OTHER): Payer: Medicare HMO

## 2017-07-07 DIAGNOSIS — Z8679 Personal history of other diseases of the circulatory system: Secondary | ICD-10-CM | POA: Diagnosis not present

## 2017-07-07 DIAGNOSIS — Z5181 Encounter for therapeutic drug level monitoring: Secondary | ICD-10-CM

## 2017-07-07 DIAGNOSIS — I480 Paroxysmal atrial fibrillation: Secondary | ICD-10-CM

## 2017-07-07 LAB — POCT INR: INR: 2

## 2017-07-07 NOTE — Patient Instructions (Signed)
Description   Continue taking 1 tablet daily except 1.5 tablets only on Mondays,Wednesday, and Fridays.  Recheck INR in 6 weeks.  Call us with any new medications or concerns [phone] [facility] [phone].

## 2017-08-18 ENCOUNTER — Ambulatory Visit (INDEPENDENT_AMBULATORY_CARE_PROVIDER_SITE_OTHER): Payer: Medicare HMO | Admitting: *Deleted

## 2017-08-18 DIAGNOSIS — Z5181 Encounter for therapeutic drug level monitoring: Secondary | ICD-10-CM | POA: Diagnosis not present

## 2017-08-18 DIAGNOSIS — Z8679 Personal history of other diseases of the circulatory system: Secondary | ICD-10-CM

## 2017-08-18 DIAGNOSIS — I480 Paroxysmal atrial fibrillation: Secondary | ICD-10-CM | POA: Diagnosis not present

## 2017-08-18 LAB — POCT INR: INR: 1.2

## 2017-08-18 NOTE — Patient Instructions (Signed)
Description   Today take 2 tablets, tomorrow take 1.5 tablets, then continue taking 1 tablet daily except 1.5 tablets only on Mondays,Wednesday, and Fridays.  Recheck INR in 2 weeks.  Call us with any new medications or concerns [phone] [facility] [phone].

## 2017-08-22 ENCOUNTER — Other Ambulatory Visit: Payer: Self-pay | Admitting: Cardiology

## 2017-08-22 ENCOUNTER — Other Ambulatory Visit: Payer: Self-pay | Admitting: Internal Medicine

## 2017-08-24 ENCOUNTER — Encounter (HOSPITAL_COMMUNITY): Payer: Self-pay | Admitting: Emergency Medicine

## 2017-08-24 ENCOUNTER — Other Ambulatory Visit: Payer: Self-pay

## 2017-08-24 ENCOUNTER — Emergency Department (HOSPITAL_COMMUNITY): Payer: Medicare HMO

## 2017-08-24 ENCOUNTER — Emergency Department (HOSPITAL_COMMUNITY)
Admission: EM | Admit: 2017-08-24 | Discharge: 2017-08-25 | Disposition: A | Discharge status: Home / Self Care | Payer: Medicare HMO | Attending: Emergency Medicine | Admitting: Emergency Medicine

## 2017-08-24 DIAGNOSIS — Z5321 Procedure and treatment not carried out due to patient leaving prior to being seen by health care provider: Secondary | ICD-10-CM | POA: Insufficient documentation

## 2017-08-24 DIAGNOSIS — R0602 Shortness of breath: Secondary | ICD-10-CM | POA: Diagnosis not present

## 2017-08-24 DIAGNOSIS — R079 Chest pain, unspecified: Secondary | ICD-10-CM | POA: Diagnosis not present

## 2017-08-24 LAB — COMPREHENSIVE METABOLIC PANEL
ALK PHOS: 69 U/L (ref 38–126)
ALT: 44 U/L (ref 17–63)
ANION GAP: 7 (ref 5–15)
AST: 39 U/L (ref 15–41)
Albumin: 3.4 g/dL — ABNORMAL LOW (ref 3.5–5.0)
BUN: 10 mg/dL (ref 6–20)
CALCIUM: 8.8 mg/dL — AB (ref 8.9–10.3)
CO2: 23 mmol/L (ref 22–32)
Chloride: 107 mmol/L (ref 101–111)
Creatinine, Ser: 1.22 mg/dL (ref 0.61–1.24)
GFR calc Af Amer: >60 mL/min (ref >60)
GFR calc non Af Amer: >60 mL/min (ref >60)
Glucose, Bld: 240 mg/dL — ABNORMAL HIGH (ref 65–99)
POTASSIUM: 4.6 mmol/L (ref 3.5–5.1)
SODIUM: 137 mmol/L (ref 135–145)
Total Bilirubin: 1.2 mg/dL (ref 0.3–1.2)
Total Protein: 6.8 g/dL (ref 6.5–8.1)

## 2017-08-24 LAB — I-STAT TROPONIN, ED
TROPONIN I, POC: 0.01 ng/mL (ref 0.00–0.08)
TROPONIN I, POC: 0 ng/mL (ref 0.00–0.08)

## 2017-08-24 LAB — CBC WITH DIFFERENTIAL/PLATELET
BASOS PCT: 0 %
Basophils Absolute: 0 10*3/uL (ref 0.0–0.1)
EOS ABS: 0.2 10*3/uL (ref 0.0–0.7)
EOS PCT: 3 %
HCT: 36 % — ABNORMAL LOW (ref 39.0–52.0)
HEMOGLOBIN: 12.4 g/dL — AB (ref 13.0–17.0)
LYMPHS ABS: 1.8 10*3/uL (ref 0.7–4.0)
Lymphocytes Relative: 30 %
MCH: 28 pg (ref 26.0–34.0)
MCHC: 34.4 g/dL (ref 30.0–36.0)
MCV: 81.3 fL (ref 78.0–100.0)
MONO ABS: 0.6 10*3/uL (ref 0.1–1.0)
MONOS PCT: 10 %
Neutro Abs: 3.4 10*3/uL (ref 1.7–7.7)
Neutrophils Relative %: 57 %
Platelets: 147 10*3/uL — ABNORMAL LOW (ref 150–400)
RBC: 4.43 MIL/uL (ref 4.22–5.81)
RDW: 15 % (ref 11.5–15.5)
WBC: 5.9 10*3/uL (ref 4.0–10.5)

## 2017-08-24 LAB — I-STAT CG4 LACTIC ACID, ED: Lactic Acid, Venous: 0.58 mmol/L (ref 0.5–1.9)

## 2017-08-24 LAB — BRAIN NATRIURETIC PEPTIDE: B Natriuretic Peptide: 216.2 pg/mL — ABNORMAL HIGH (ref 0.0–100.0)

## 2017-08-24 LAB — PROTIME-INR
INR: 1.38
Prothrombin Time: 16.9 seconds — ABNORMAL HIGH (ref 11.4–15.2)

## 2017-08-24 IMAGING — CR DG CHEST 2V
2 series · 2 of 2 positions shown · non-contrast
Comparison: 03/30/2016

CLINICAL DATA: Chest pain, shortness of breath

EXAM:
CHEST - 2 VIEW

[chest pa]
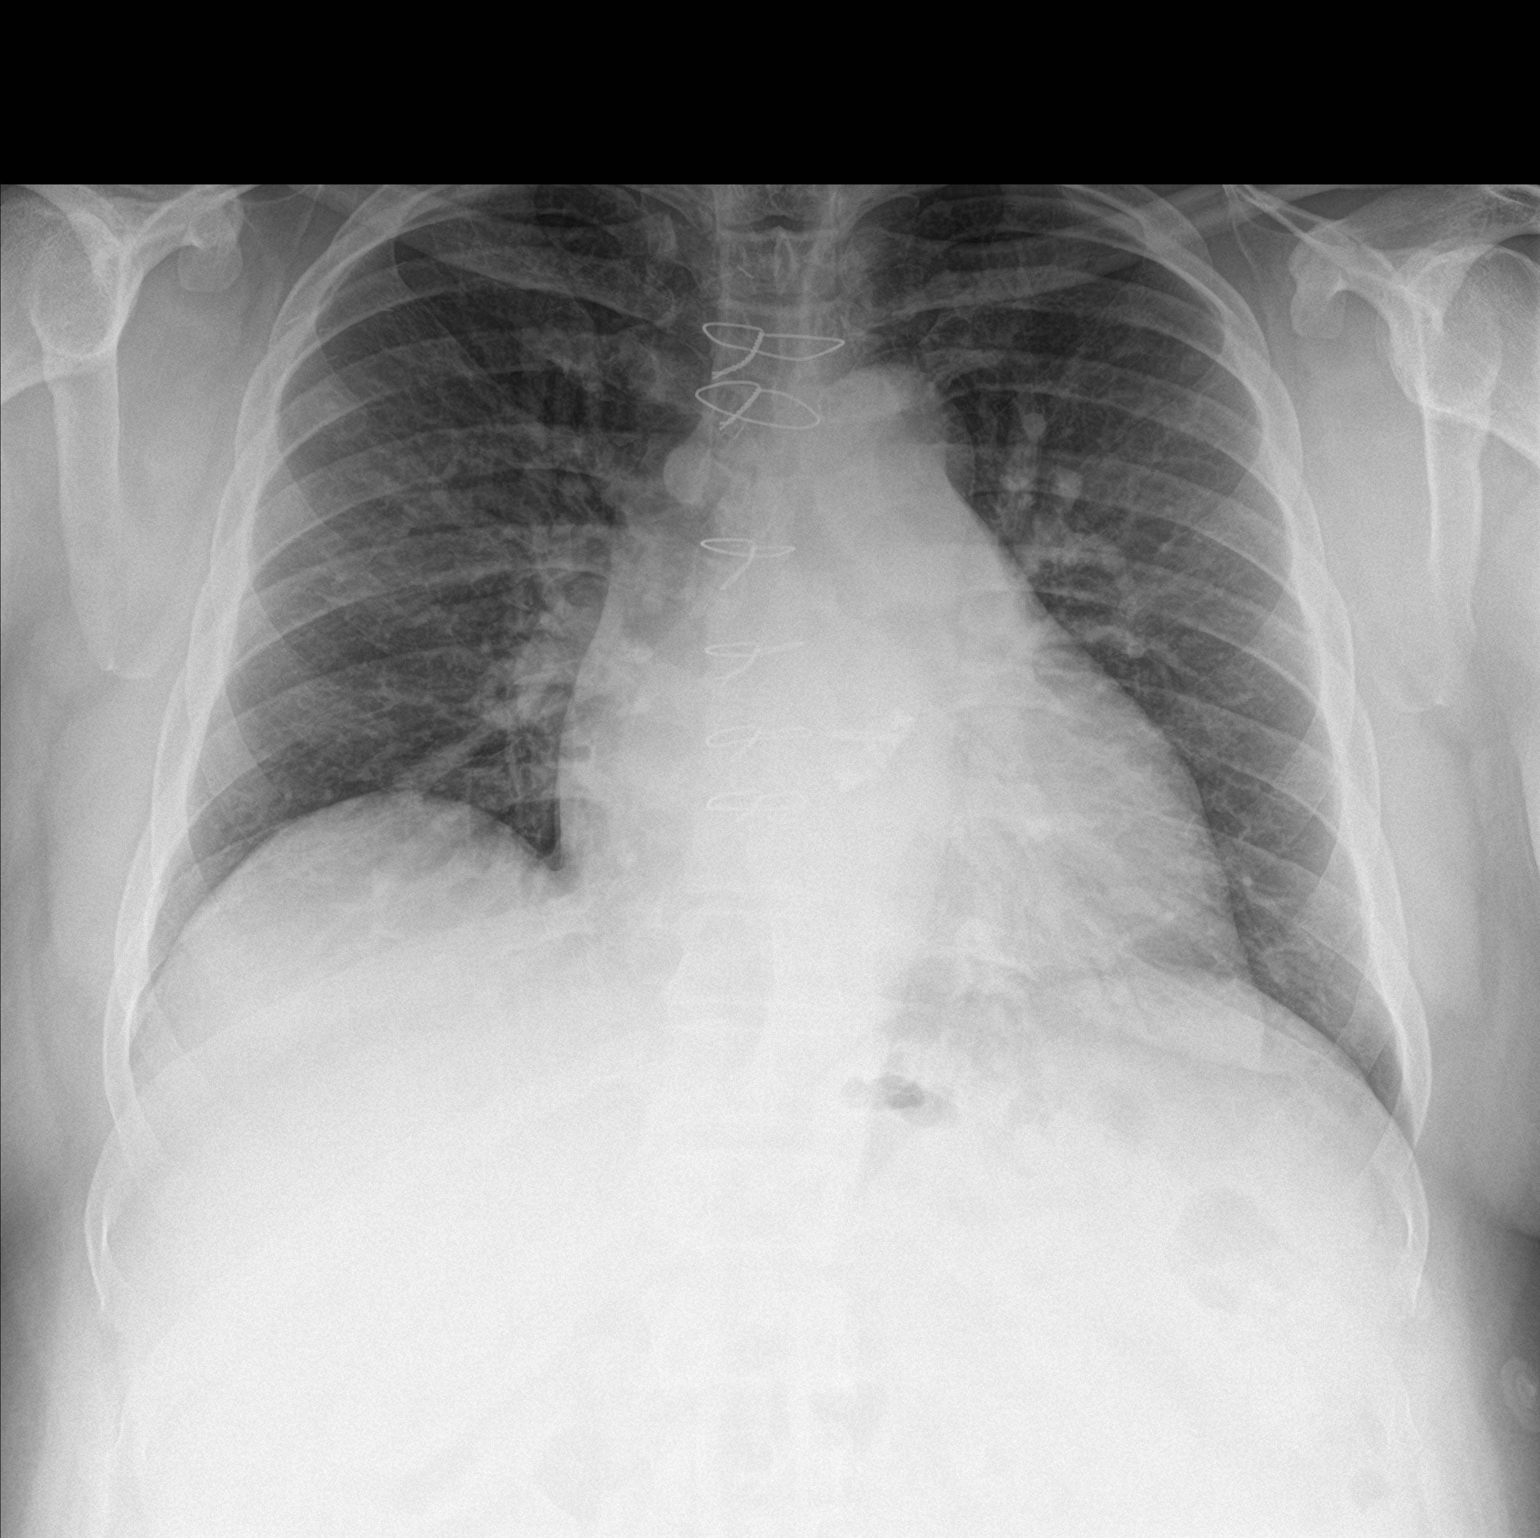

[chest lat]
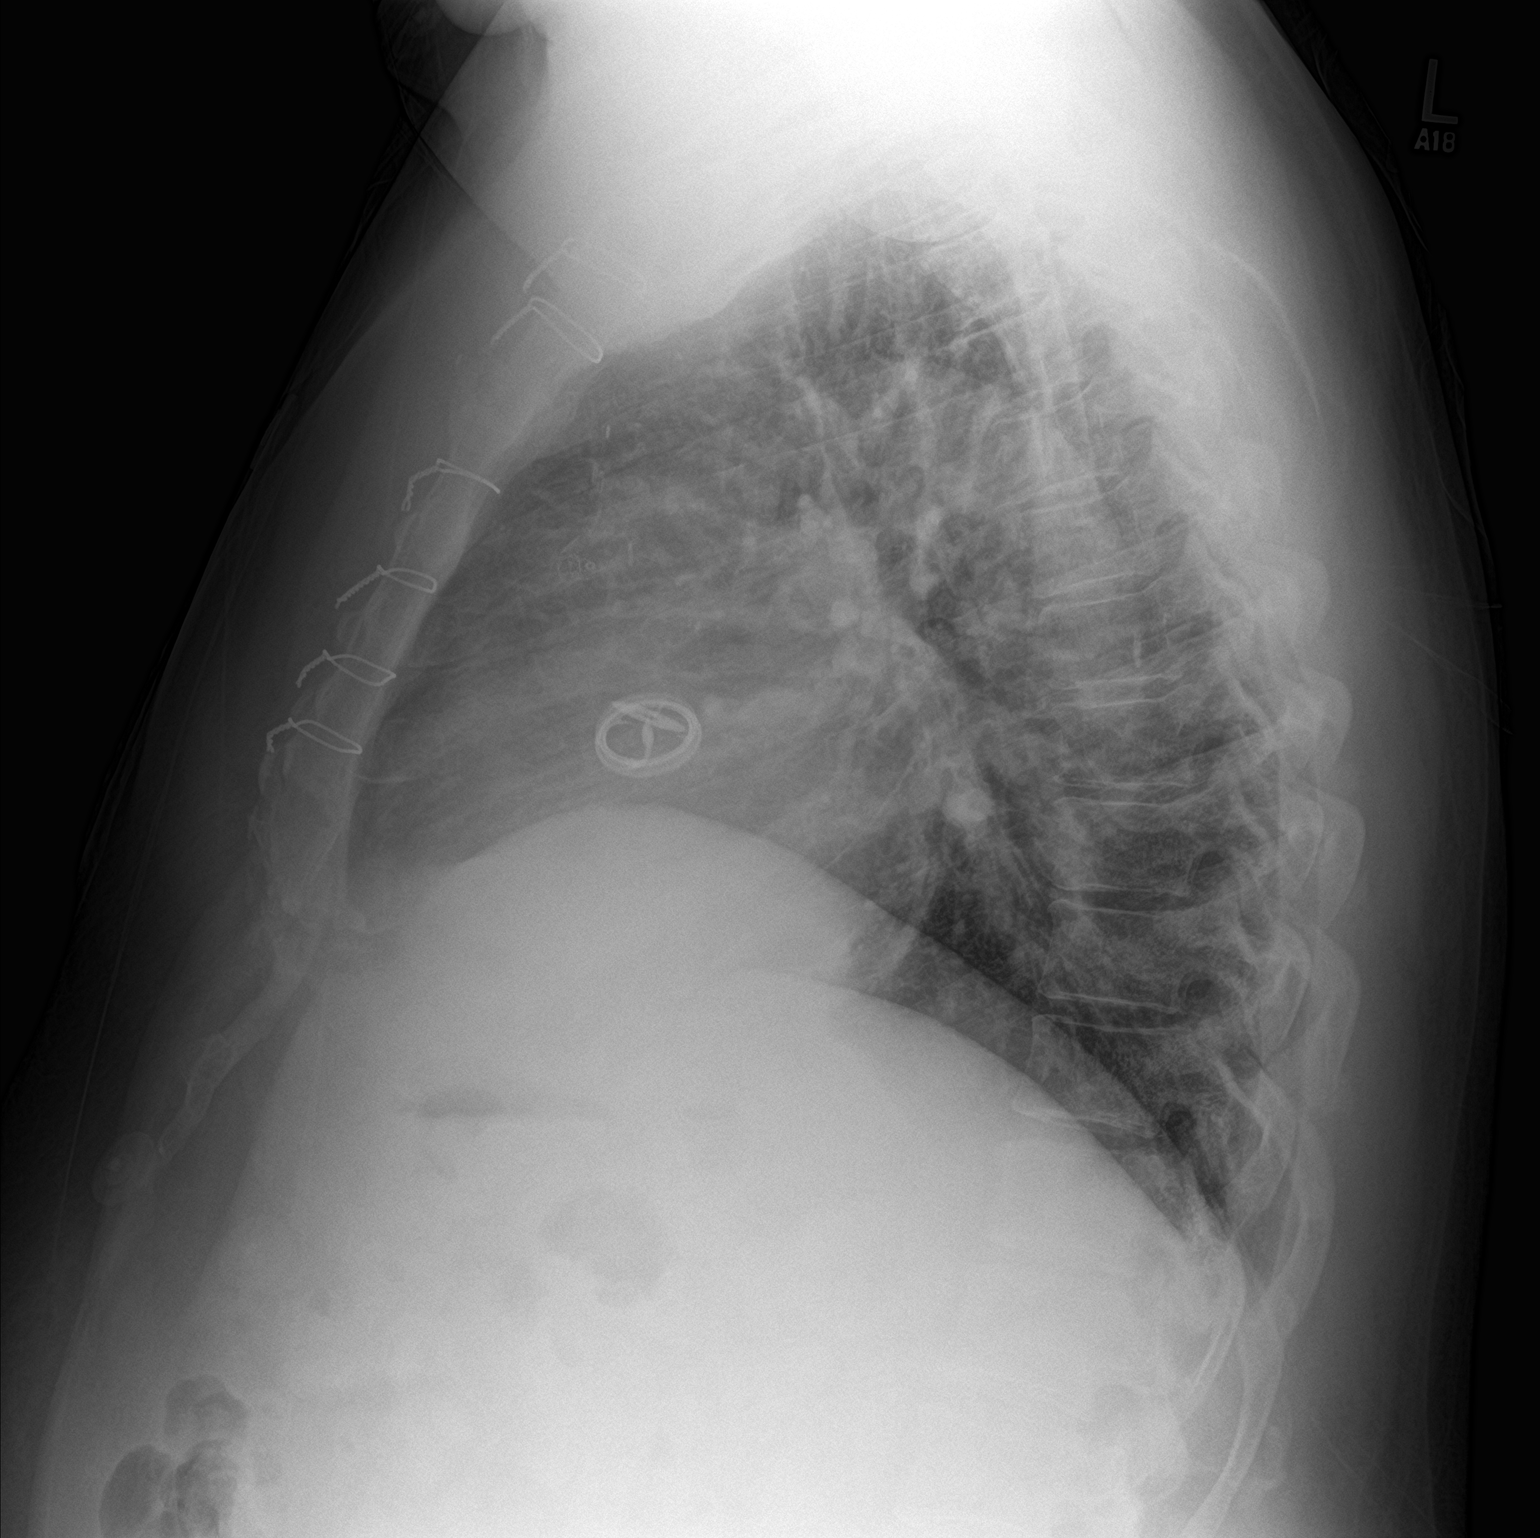

[2 of 2 positions shown; findings below may reference images not displayed]

FINDINGS: There is no focal parenchymal opacity. There is no pleural effusion
or pneumothorax. The heart and mediastinal contours are
unremarkable. There is evidence of prior CABG. There is evidence of
prior aortic valve replacement.

The osseous structures are unremarkable.
IMPRESSION: No active cardiopulmonary disease.

## 2017-08-24 NOTE — ED Provider Notes (Cosign Needed)
Patient placed in Quick Look pathway, seen and evaluated   Chief Complaint: SOB  HPI:   Patient reports he ran out of his lasix two days ago and has had shortness of breath since then.  No cough, fevers, chills or CP.  Shortness of breath worse with walking.    ROS: No chest pain, is short of breath, (one)  Physical Exam:   Gen: No distress  Neuro: Awake and Alert  Skin: Warm    Focused Exam +1 pitting edema of bilateral lower extremities, lungs CTAB.    Initiation of care has begun. The patient has been counseled on the process, plan, and necessity for staying for the completion/evaluation, and the remainder of the medical screening examination    Lorin Glass, Hershal Coria 08/24/17 1811

## 2017-08-24 NOTE — ED Triage Notes (Signed)
Pt c/o shortness of breath x's 2 days.  St's he ran out of his lasix.  Pt denies any chest pain or cough.

## 2017-08-25 ENCOUNTER — Other Ambulatory Visit: Payer: Self-pay

## 2017-08-25 ENCOUNTER — Emergency Department (HOSPITAL_COMMUNITY)
Admission: EM | Admit: 2017-08-25 | Discharge: 2017-08-26 | Disposition: A | Discharge status: Home / Self Care | Payer: Medicare HMO | Attending: Emergency Medicine | Admitting: Emergency Medicine

## 2017-08-25 ENCOUNTER — Ambulatory Visit: Payer: Self-pay | Admitting: *Deleted

## 2017-08-25 ENCOUNTER — Encounter (HOSPITAL_COMMUNITY): Payer: Self-pay

## 2017-08-25 ENCOUNTER — Telehealth: Payer: Self-pay | Admitting: Internal Medicine

## 2017-08-25 DIAGNOSIS — D649 Anemia, unspecified: Secondary | ICD-10-CM | POA: Diagnosis not present

## 2017-08-25 DIAGNOSIS — R0602 Shortness of breath: Secondary | ICD-10-CM | POA: Diagnosis not present

## 2017-08-25 DIAGNOSIS — R791 Abnormal coagulation profile: Secondary | ICD-10-CM

## 2017-08-25 DIAGNOSIS — Z79899 Other long term (current) drug therapy: Secondary | ICD-10-CM | POA: Diagnosis not present

## 2017-08-25 DIAGNOSIS — I5033 Acute on chronic diastolic (congestive) heart failure: Secondary | ICD-10-CM | POA: Diagnosis not present

## 2017-08-25 DIAGNOSIS — I110 Hypertensive heart disease with heart failure: Secondary | ICD-10-CM | POA: Diagnosis not present

## 2017-08-25 DIAGNOSIS — Z7984 Long term (current) use of oral hypoglycemic drugs: Secondary | ICD-10-CM | POA: Insufficient documentation

## 2017-08-25 DIAGNOSIS — R7989 Other specified abnormal findings of blood chemistry: Secondary | ICD-10-CM | POA: Diagnosis not present

## 2017-08-25 DIAGNOSIS — R079 Chest pain, unspecified: Secondary | ICD-10-CM | POA: Diagnosis not present

## 2017-08-25 DIAGNOSIS — Z7901 Long term (current) use of anticoagulants: Secondary | ICD-10-CM | POA: Insufficient documentation

## 2017-08-25 DIAGNOSIS — E119 Type 2 diabetes mellitus without complications: Secondary | ICD-10-CM | POA: Insufficient documentation

## 2017-08-25 LAB — I-STAT TROPONIN, ED
TROPONIN I, POC: 0 ng/mL (ref 0.00–0.08)
Troponin i, poc: 0 ng/mL (ref 0.00–0.08)

## 2017-08-25 MED ORDER — FUROSEMIDE 40 MG PO TABS: 40.0000 mg | ORAL_TABLET | Freq: Two times a day (BID) | ORAL | 0 refills | Status: DC

## 2017-08-25 NOTE — ED Notes (Signed)
Pt called for vitals, no response. 

## 2017-08-25 NOTE — ED Triage Notes (Signed)
Pt endorses shob x 3 days with chest pain. Came here last night but left due to the wait. PCP told pt to come back due to fluid build up in his legs. Pt speaking in complete sentences.

## 2017-08-25 NOTE — Telephone Encounter (Signed)
Patient is calling to report he has elevated blood pressure- patient states he is out of fluid pills as well- he has been out for 3 days.Patient had gone to the ED last night- but left after he waited a long time. Advised patient that he needs to go back-( per protocol) due to the breathing difficultly he is having- he states he will.  Reason for Disposition . [7] Systolic BP  >= 654 OR Diastolic >= 650 AND [3] cardiac or neurologic symptoms (e.g., chest pain, difficulty breathing, unsteady gait, blurred vision)  Answer Assessment - Initial Assessment Questions 1. BLOOD PRESSURE: "What is the blood pressure?" "Did you take at least two measurements 5 minutes apart?"     140/101  144/104 2. ONSET: "When did you take your blood pressure?"     1:45 3. HOW: "How did you obtain the blood pressure?" (e.g., visiting nurse, automatic home BP monitor)     automatic 4. HISTORY: "Do you have a history of high blood pressure?"     yes 5. MEDICATIONS: "Are you taking any medications for blood pressure?" "Have you missed any doses recently?"     Yes- out of lasix for 4 days 6. OTHER SYMPTOMS: "Do you have any symptoms?" (e.g., headache, chest pain, blurred vision, difficulty breathing, weakness)     Difficulty breathing, vision changes, chest pain 7. PREGNANCY: "Is there any chance you are pregnant?" "When was your last menstrual period?"     n/a  Protocols used: HIGH BLOOD PRESSURE-A-AH

## 2017-08-25 NOTE — ED Notes (Signed)
Called with no answer 

## 2017-08-25 NOTE — Telephone Encounter (Signed)
Cannot reach pt. at this time.  He left message he is awaiting his mail order refill of Furosemide.  Will order 10 day supply of Furosemide from local pharmacy.  Left voice message re: the above.

## 2017-08-25 NOTE — Telephone Encounter (Signed)
Noted that Furosemide was refilled by Dr. Stanford Breed today.

## 2017-08-25 NOTE — Telephone Encounter (Signed)
Copied from [place] [id number]. Topic: Quick Communication - Rx Refill/Question >> Aug 25, 2017  1:50 PM Lennox Solders wrote: Medication:furosemide . [facility] [address]. Pt is waiting on mail order delivery. Pt is out of med

## 2017-08-26 ENCOUNTER — Emergency Department (HOSPITAL_COMMUNITY): Payer: Medicare HMO

## 2017-08-26 DIAGNOSIS — R0602 Shortness of breath: Secondary | ICD-10-CM | POA: Diagnosis not present

## 2017-08-26 DIAGNOSIS — R079 Chest pain, unspecified: Secondary | ICD-10-CM | POA: Diagnosis not present

## 2017-08-26 LAB — CBC WITH DIFFERENTIAL/PLATELET
Basophils Absolute: 0 10*3/uL (ref 0.0–0.1)
Basophils Relative: 0 %
Eosinophils Absolute: 0.2 10*3/uL (ref 0.0–0.7)
Eosinophils Relative: 4 %
HEMATOCRIT: 34.2 % — AB (ref 39.0–52.0)
HEMOGLOBIN: 11.6 g/dL — AB (ref 13.0–17.0)
Lymphocytes Relative: 30 %
Lymphs Abs: 1.5 10*3/uL (ref 0.7–4.0)
MCH: 27.7 pg (ref 26.0–34.0)
MCHC: 33.9 g/dL (ref 30.0–36.0)
MCV: 81.6 fL (ref 78.0–100.0)
MONO ABS: 0.4 10*3/uL (ref 0.1–1.0)
MONOS PCT: 8 %
NEUTROS ABS: 2.9 10*3/uL (ref 1.7–7.7)
NEUTROS PCT: 58 %
Platelets: 135 10*3/uL — ABNORMAL LOW (ref 150–400)
RBC: 4.19 MIL/uL — ABNORMAL LOW (ref 4.22–5.81)
RDW: 15.5 % (ref 11.5–15.5)
WBC: 4.9 10*3/uL (ref 4.0–10.5)

## 2017-08-26 LAB — BASIC METABOLIC PANEL
ANION GAP: 5 (ref 5–15)
BUN: 11 mg/dL (ref 6–20)
CALCIUM: 8.9 mg/dL (ref 8.9–10.3)
CHLORIDE: 110 mmol/L (ref 101–111)
CO2: 24 mmol/L (ref 22–32)
Creatinine, Ser: 1.09 mg/dL (ref 0.61–1.24)
GFR calc non Af Amer: >60 mL/min (ref >60)
GLUCOSE: 174 mg/dL — AB (ref 65–99)
Potassium: 4 mmol/L (ref 3.5–5.1)
Sodium: 139 mmol/L (ref 135–145)

## 2017-08-26 LAB — BRAIN NATRIURETIC PEPTIDE: B Natriuretic Peptide: 218.3 pg/mL — ABNORMAL HIGH (ref 0.0–100.0)

## 2017-08-26 LAB — PROTIME-INR
INR: 1.46
PROTHROMBIN TIME: 17.6 s — AB (ref 11.4–15.2)

## 2017-08-26 IMAGING — DX DG CHEST 2V
2 series · 2 of 2 positions shown · non-contrast
Comparison: 08/24/2017

CLINICAL DATA: Shortness of breath and chest pain for 3 days.

EXAM:
CHEST - 2 VIEW

[chest pa]
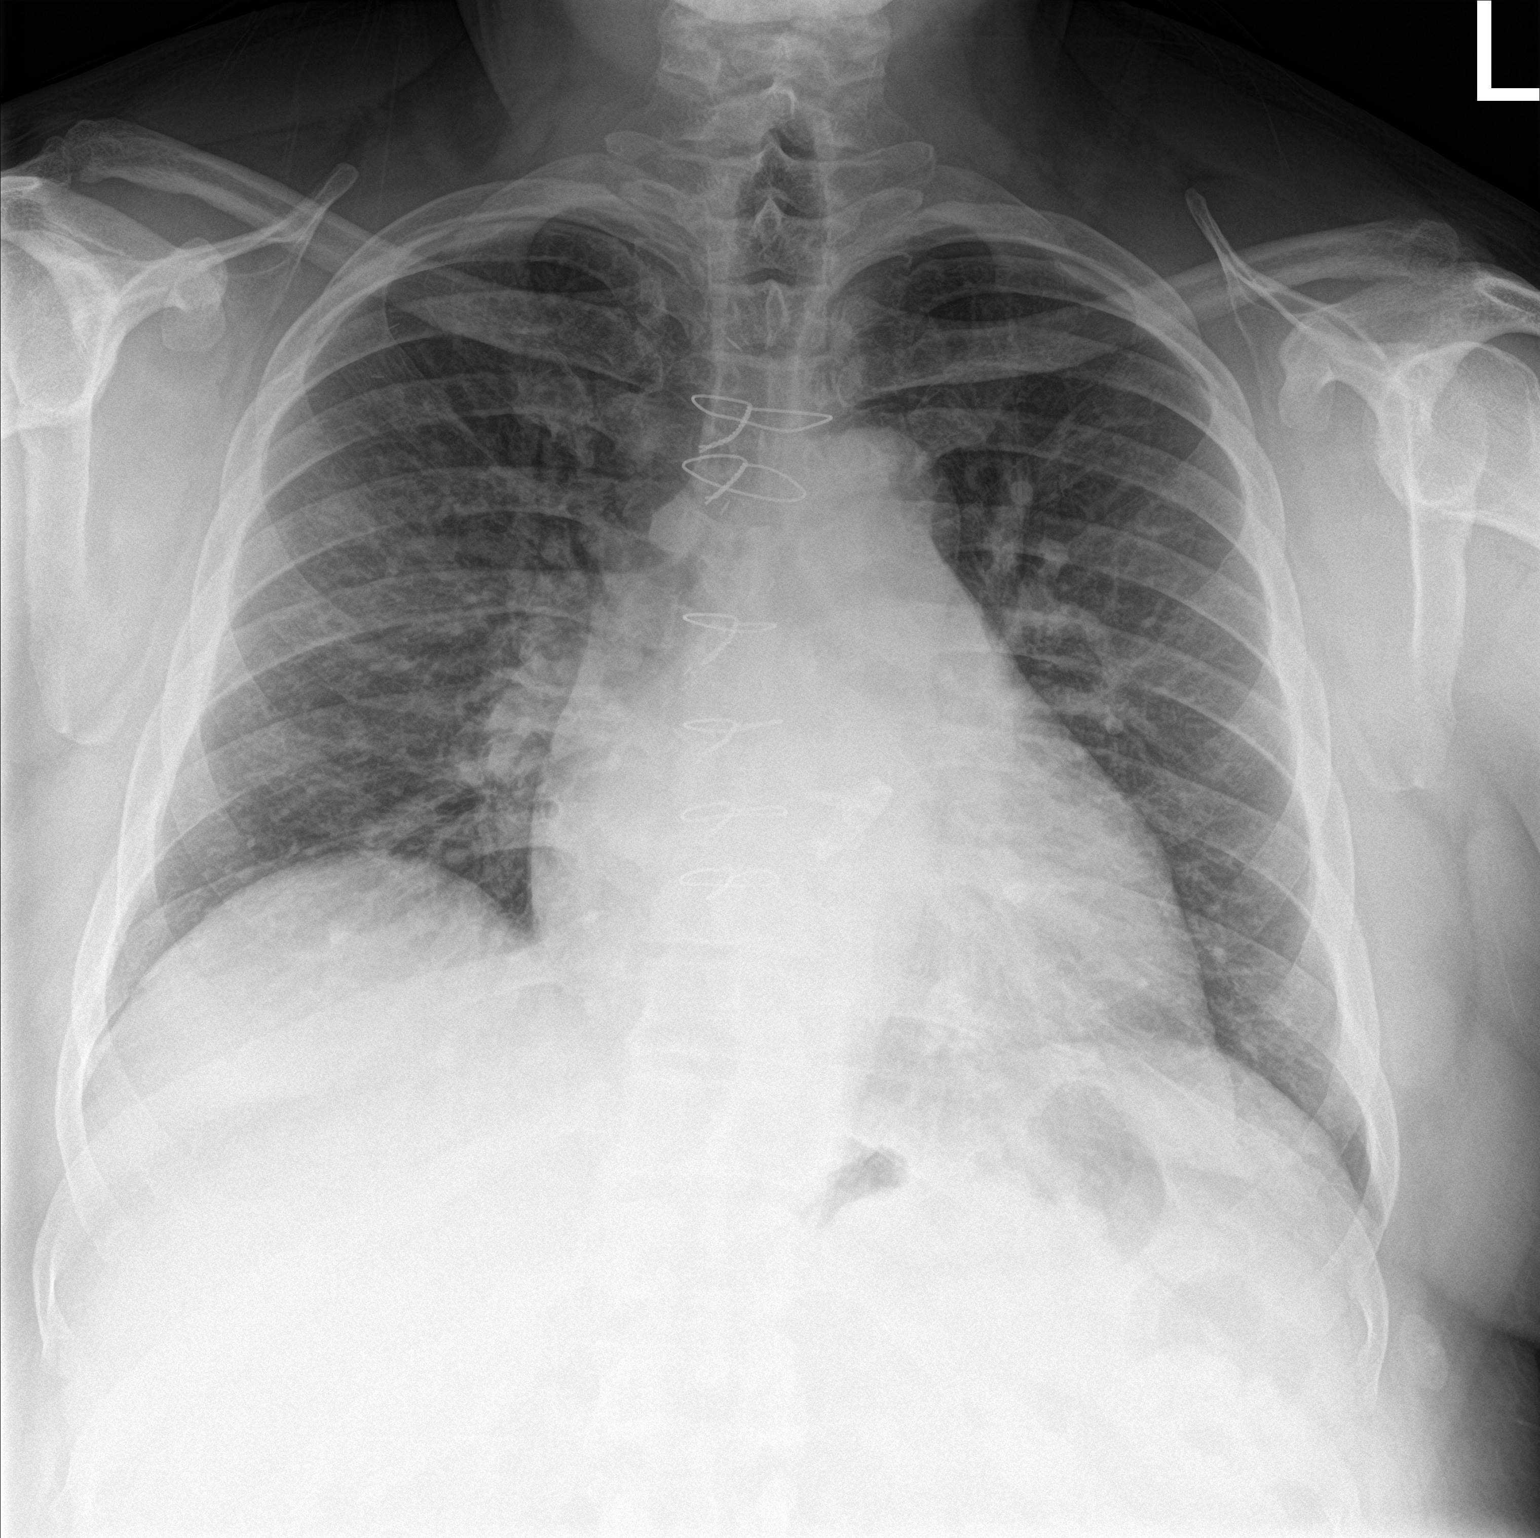

[chest lat]
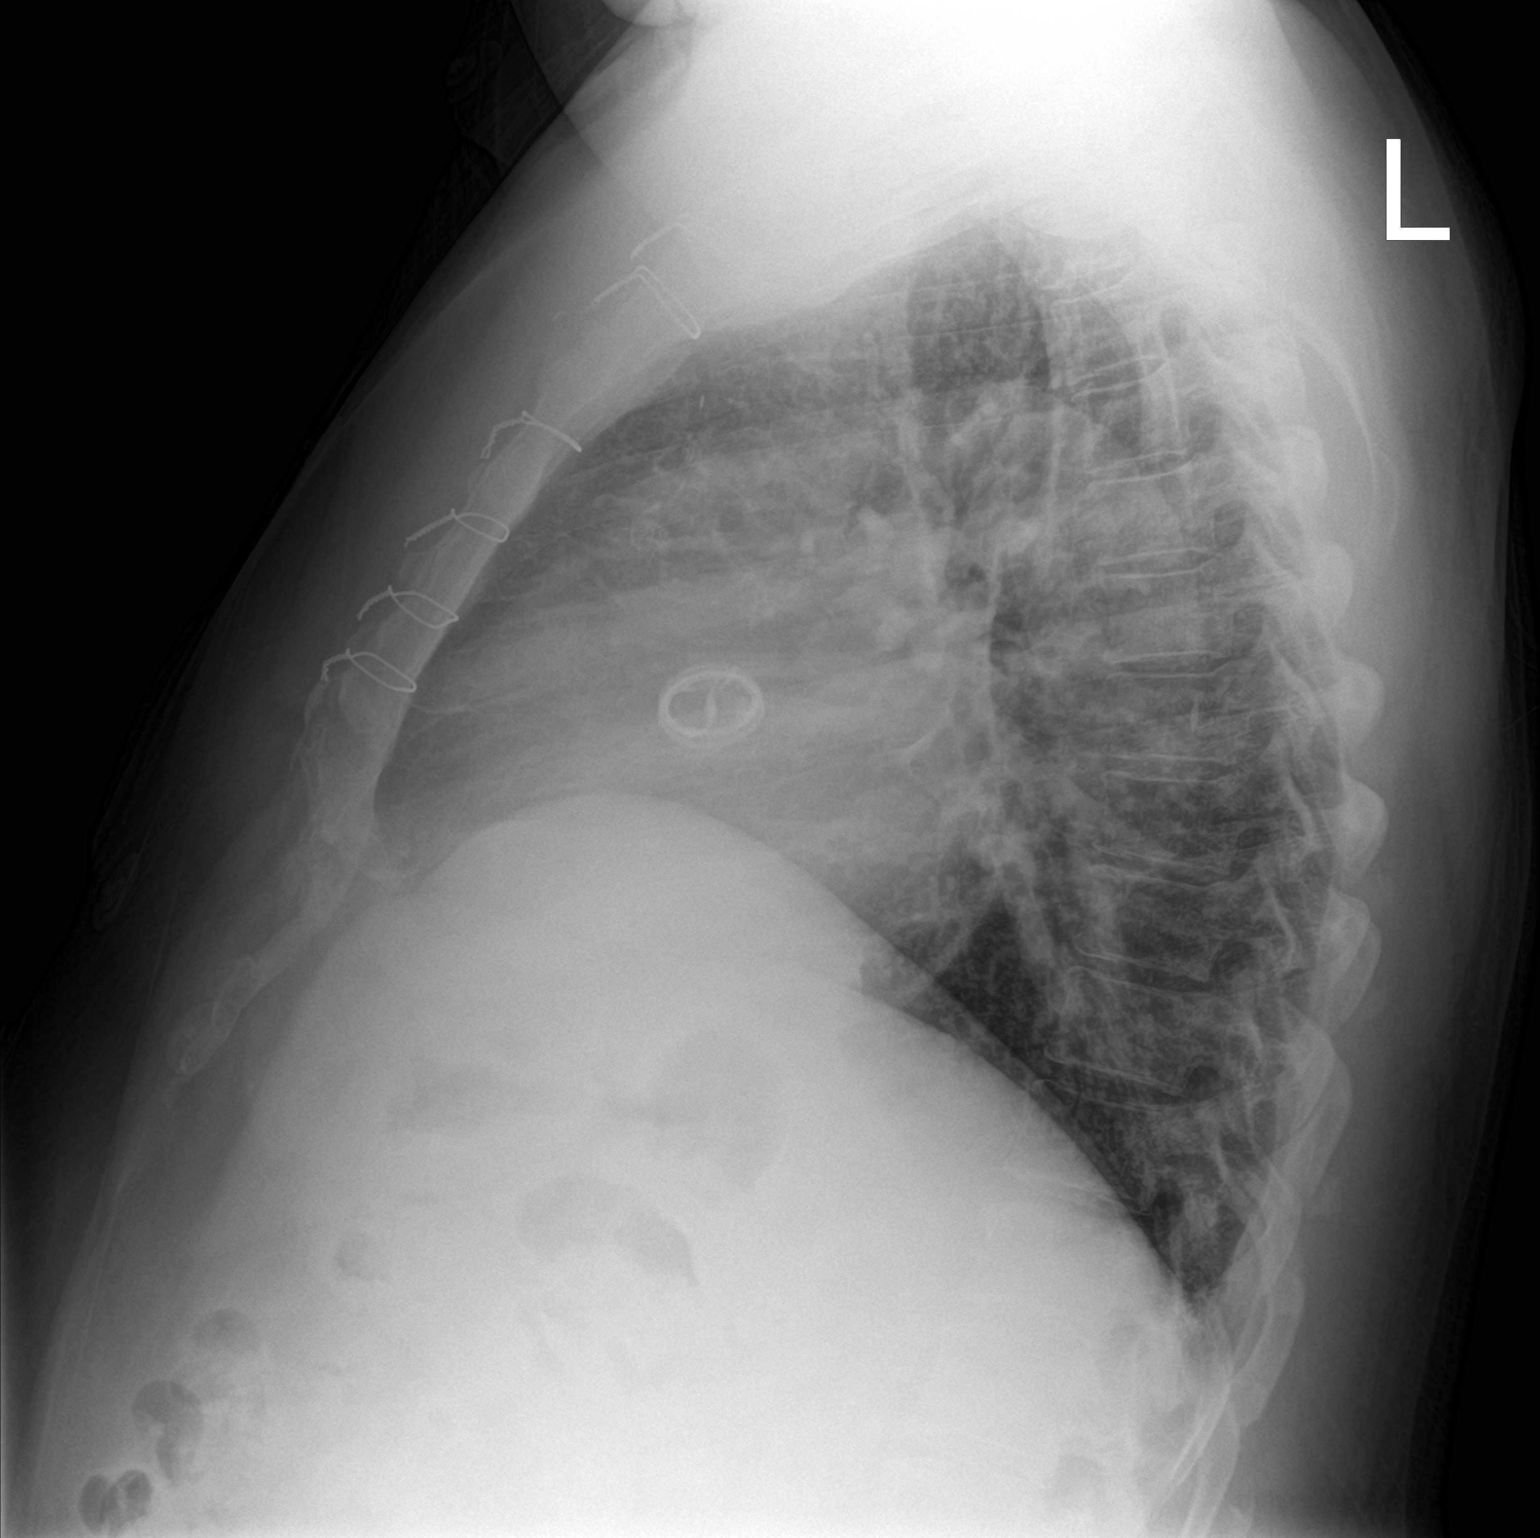

[2 of 2 positions shown; findings below may reference images not displayed]

FINDINGS: Postoperative changes in the mediastinum. Shallow inspiration.
Cardiac enlargement with mild pulmonary vascular congestion. No
edema or consolidation. No blunting of costophrenic angles. No
pneumothorax. Mediastinal contours appear intact.
IMPRESSION: Cardiac enlargement with mild vascular congestion. No edema or
consolidation.

## 2017-08-26 MED ORDER — FUROSEMIDE 40 MG PO TABS: 40.0000 mg | ORAL_TABLET | Freq: Two times a day (BID) | ORAL | 0 refills | Status: DC

## 2017-08-26 MED ORDER — FUROSEMIDE 10 MG/ML IJ SOLN
40.0000 mg | Freq: Once | INTRAMUSCULAR | Status: AC
  Administered 2017-08-26: 40 mg via INTRAVENOUS
  Filled 2017-08-26: qty 4

## 2017-08-26 NOTE — ED Notes (Signed)
Patient transported to X-ray 

## 2017-08-26 NOTE — ED Provider Notes (Addendum)
[facility]ERGENCY DEPARTMENT Provider Note   CSN: [id number] Arrival date & time: 08/25/17  1726     History   Chief Complaint Chief Complaint  Patient presents with  . Shortness of Breath    HPI [name] is a 52 y.o. male.  The history is provided by the patient.  Shortness of Breath   He has a history of chronic diastolic heart failure, diabetes, hypertension, hyperlipidemia, paroxysmal atrial fibrillation, transient ischemic attack, aortic valve replacement, chronic anticoagulation with warfarin and comes in with about 3 days of increasing leg edema and dyspnea on exertion.  He had run out of his furosemide, he called the mail order pharmacy and was told that it would be 5-7 business days before it could be delivered.  He is also had a dull chest pain since yesterday.  Pain is constant and not affected by exertion or body position.  Dyspnea is worse with exertion, but not with lying flat.  He has not missed any of his other medications.  Past Medical History:  Diagnosis Date  . Ascending aortic dissection ([place])    a. 2000 - with aortic valve involvement. S/p repair of aortic dissection with placement of mechanical AVR.  [name] CAD (coronary artery disease)    a. 2000 VG->PDA @ time of Ao dissection repair;  b. Cardiac CT 06/2010: no CAD, only mild plaque in prox RCA;  c. 2015 Cath: LM nl, LAD nl, LCX nl, RCA 100, VG->PDA 90 (4.5x12 Rebel BMS).  . Cholelithiasis 08/22/2013  . Chronic diastolic CHF (congestive heart failure) ([place])    a. 03/2016 Echo: EF 55-60%, no rwma, Gr2 DD.  . Diabetes mellitus   . Dyspnea   . Gross hematuria   . H/O mechanical aortic valve replacement    a. 03/2016 Echo: AoV mean gradient 5mmHg, Valve Area (VTI) 1.36 cm^2, (Vmax) 1.22 cm^2, mod dil LA.  [name] Hyperlipidemia   . Hypertension   . NEPHROLITHIASIS, HX OF   . Obesity   . PAF (paroxysmal atrial fibrillation) ([place])    a. H/o such, with recurrence of coarse afib/flutter  during ER consult 03/2012.  [name] TRANSIENT ISCHEMIC ATTACK, HX OF    a. In 2004.  [name] Unspecified vitamin D deficiency     Patient Active Problem List   Diagnosis Date Noted  . Acute on chronic diastolic CHF (congestive heart failure) ([place]) 03/30/2016  . Preventative health care 02/25/2016  . Screen for colon cancer 09/03/2015  . PTSD (post-traumatic stress disorder) 10/31/2013  . Depressive disorder 10/31/2013  . Hallucinations 10/31/2013  . Cholelithiasis 08/22/2013  . Hypothyroidism 08/22/2013  . Encounter for therapeutic drug monitoring 08/07/2013  . Unstable angina ([place]) 07/25/2013  . Moderate to severe aortic stenosis- pt will need AVR revision 07/24/2013  . CAD of artery bypass graft- SVG-RCA BMS 2/271/5 07/24/2013  . Hematoma of groin 07/24/2013  . Retroperitoneal bleed Nov 2014 07/24/2013  . Acute diastolic heart failure ([place]) 07/21/2013  . Ascending aortic dissection  in 2000- s/p [place] and MDT tilting disc AVR   . PAF (paroxysmal atrial fibrillation) ([place])   . Bladder neck obstruction 05/04/2012  . Unspecified vitamin D deficiency 08/07/2008  . Gross hematuria 08/07/2008  . FATIGUE 12/26/2007  . History of cardiovascular disorder 12/26/2007  . Hyperlipidemia 08/20/2007  . Diabetes ([place]) 05/21/2007  . Essential hypertension 05/21/2007  . NEPHROLITHIASIS, HX OF 05/21/2007    Past Surgical History:  Procedure Laterality Date  . AORTIC VALVE REPLACEMENT  2000  . APPENDECTOMY  [place] BYPASS GRAFT  2000  . LEFT AND RIGHT HEART CATHETERIZATION WITH CORONARY ANGIOGRAM N/A 07/19/2013   Procedure: LEFT AND RIGHT HEART CATHETERIZATION WITH CORONARY ANGIOGRAM;  Surgeon: [name], MD;  Location: [facility] CATH LAB;  Service: Cardiovascular;  Laterality: N/A;  . TEE WITHOUT CARDIOVERSION N/A 07/22/2013   Procedure: TRANSESOPHAGEAL ECHOCARDIOGRAM (TEE);  Surgeon: [name], MD;  Location: [facility] ENDOSCOPY;  Service: Cardiovascular;  Laterality: N/A;         Home Medications    Prior to Admission medications   Medication Sig Start Date End Date Taking? Authorizing Provider  acarbose (PRECOSE) 25 MG tablet Take 1 tablet (25 mg total) by mouth 3 (three) times daily with meals. 11/06/15   Renato Shin, MD  bromocriptine (PARLODEL) 2.5 MG tablet Take 2.5 mg by mouth daily. Take 1/4 tablet daily    [provider]  canagliflozin (INVOKANA) 100 MG TABS tablet Take 1 tablet (100 mg total) by mouth daily before breakfast. 01/19/16   Renato Shin, MD  cholecalciferol (VITAMIN D) 1000 UNITS tablet Take 1,000 Units by mouth daily.    [provider]  cloNIDine (CATAPRES) 0.1 MG tablet TAKE 1 TABLET TWICE DAILY 03/14/17   Biagio Borg, MD  furosemide (LASIX) 40 MG tablet Take 1 tablet (40 mg total) by mouth 2 (two) times daily. PT OVERDUE FOR OV PLEASE CALL FOR APPT 08/23/17   Lelon Perla, MD  furosemide (LASIX) 40 MG tablet Take 1 tablet (40 mg total) by mouth 2 (two) times daily. 08/25/17   Biagio Borg, MD  glimepiride (AMARYL) 1 MG tablet TAKE 3 TABLETS ONE TIME DAILY WITH BREAKFAST 03/14/17   Biagio Borg, MD  glimepiride (AMARYL) 4 MG tablet Take 1 tablet (4 mg total) by mouth daily before breakfast. 11/06/15   Renato Shin, MD  KLOR-CON M20 20 MEQ tablet TAKE 2 TABLETS EVERY DAY 08/23/17   Biagio Borg, MD  levothyroxine (SYNTHROID, LEVOTHROID) 50 MCG tablet TAKE 1 TABLET EVERY DAY 03/14/17   Biagio Borg, MD  losartan (COZAAR) 100 MG tablet TAKE 1 TABLET EVERY DAY 03/14/17   Biagio Borg, MD  metFORMIN (GLUCOPHAGE) 1000 MG tablet TAKE 1 TABLET TWICE DAILY WITH MEALS 08/23/17   Biagio Borg, MD  metFORMIN (GLUCOPHAGE-XR) 500 MG 24 hr tablet Take 4 tablets (2,000 mg total) by mouth daily. 11/06/15   Renato Shin, MD  metoprolol (TOPROL-XL) 200 MG 24 hr tablet Take 1 tablet (200 mg total) by mouth daily. 12/08/15   Renato Shin, MD  nitroGLYCERIN (NITROSTAT) 0.4 MG SL tablet Place 1 tablet (0.4 mg total) under the tongue  every 5 (five) minutes x 3 doses as needed for chest pain. 07/25/13   Erlene Quan, PA-C  Potassium Chloride ER 20 MEQ TBCR TAKE 2 TABLETS EVERY DAY 12/07/16   Biagio Borg, MD  pravastatin (PRAVACHOL) 40 MG tablet TAKE 1 TABLET EVERY DAY 03/14/17   Biagio Borg, MD  sitaGLIPtin (JANUVIA) 100 MG tablet Take 1 tablet (100 mg total) by mouth daily. 12/09/15   Renato Shin, MD  warfarin (COUMADIN) 5 MG tablet TAKE AS DIRECTED BY COUMADIN CLINIC 08/23/17   Deboraha Sprang, MD    Family History Family History  Problem Relation Age of Onset  . Coronary artery disease Mother   . Hypertension Mother   . Diabetes Brother   . Coronary artery disease Other 64       male 1st degree relative, CABG in  his 39's (father)    Social History Social History   Tobacco Use  . Smoking status: Never Smoker  . Smokeless tobacco: Never Used  Substance Use Topics  . Alcohol use: No    Alcohol/week: 0.0 oz  . Drug use: No     Allergies   Insulin glargine   Review of Systems Review of Systems  Respiratory: Positive for shortness of breath.   All other systems reviewed and are negative.    Physical Exam Updated Vital Signs BP (!) 152/125 (BP Location: Right Arm)   Pulse 98   Temp 98.4 F (36.9 C) (Oral)   Resp 17   Ht 6\' 2"  (1.88 m)   Wt 130.6 kg (288 lb)   SpO2 100%   BMI 36.98 kg/m   Physical Exam  Nursing note and vitals reviewed.  52 year old male, resting comfortably and in no acute distress. Vital signs are significant for elevated blood pressure. Oxygen saturation is 100%, which is normal. Head is normocephalic and atraumatic. PERRLA, EOMI. Oropharynx is clear. Neck is nontender and supple without adenopathy or JVD. Back is nontender and there is no CVA tenderness. Lungs are clear without rales, wheezes, or rhonchi. Chest is nontender. Heart has regular rate and rhythm without murmur.  Prominent prosthetic valve click noted. Abdomen is soft, flat, nontender without masses or  hepatosplenomegaly and peristalsis is normoactive. Extremities have 2+ pretibial edema and 1-2+ presacral edema, full range of motion is present. Skin is warm and dry without rash. Neurologic: Mental status is normal, cranial nerves are intact, there are no motor or sensory deficits.  ED Treatments / Results  Labs (all labs ordered are listed, but only abnormal results are displayed) Labs Reviewed  BASIC METABOLIC PANEL - Abnormal; Notable for the following components:      Result Value   Glucose, Bld 174 (*)    All other components within normal limits  CBC WITH DIFFERENTIAL/PLATELET - Abnormal; Notable for the following components:   RBC 4.19 (*)    Hemoglobin 11.6 (*)    HCT 34.2 (*)    Platelets 135 (*)    All other components within normal limits  BRAIN NATRIURETIC PEPTIDE - Abnormal; Notable for the following components:   B Natriuretic Peptide 218.3 (*)    All other components within normal limits  PROTIME-INR - Abnormal; Notable for the following components:   Prothrombin Time 17.6 (*)    All other components within normal limits  I-STAT TROPONIN, ED  I-STAT TROPONIN, ED    EKG EKG Interpretation  Date/Time:  Saturday August 26 2017 03:29:43 EDT Ventricular Rate:  93 PR Interval:    QRS Duration: 96 QT Interval:  392 QTC Calculation: 487 R Axis:   29 Text Interpretation:  Sinus rhythm Inferior infarct , age undetermined Cannot rule out Anterior infarct , age undetermined Abnormal ECG When compared with ECG of 08/24/2017, No significant change was found Confirmed by Delora Fuel ([id number]) on 08/26/2017 3:56:22 AM   Radiology Dg Chest 2 View  Result Date: 08/26/2017 CLINICAL DATA:  Shortness of breath and chest pain for 3 days. EXAM: CHEST - 2 VIEW COMPARISON:  08/24/2017 FINDINGS: Postoperative changes in the mediastinum. Shallow inspiration. Cardiac enlargement with mild pulmonary vascular congestion. No edema or consolidation. No blunting of costophrenic angles. No  pneumothorax. Mediastinal contours appear intact. IMPRESSION: Cardiac enlargement with mild vascular congestion. No edema or consolidation. Electronically Signed   By: Lucienne Capers M.D.   On: 08/26/2017 01:21   Dg Chest  2 View  Result Date: 08/24/2017 CLINICAL DATA:  Chest pain, shortness of breath EXAM: CHEST - 2 VIEW COMPARISON:  03/30/2016 FINDINGS: There is no focal parenchymal opacity. There is no pleural effusion or pneumothorax. The heart and mediastinal contours are unremarkable. There is evidence of prior CABG. There is evidence of prior aortic valve replacement. The osseous structures are unremarkable. IMPRESSION: No active cardiopulmonary disease. Electronically Signed   By: Kathreen Devoid   On: 08/24/2017 20:11    Procedures Procedures  Medications Ordered in ED Medications  furosemide (LASIX) injection 40 mg (has no administration in time range)     Initial Impression / Assessment and Plan / ED Course  I have reviewed the triage vital signs and the nursing notes.  Pertinent labs & imaging results that were available during my care of the patient were reviewed by me and considered in my medical decision making (see chart for details).  Dyspnea and edema most likely secondary to not using his diuretic.  Old records are reviewed, and he was in the ED yesterday for similar complaints, but left without being seen.  He had been admitted to the hospital with CHF exacerbation in November 2017.  Screening labs, x-ray, ECG are ordered and will be given a dose of furosemide in the ED.  We will also check INR.  It is noted that last 2 INR values obtained as an outpatient were subtherapeutic.  He has had good diuresis with intravenous furosemide.  Chest x-ray does shows significant pulmonary vascular congestion.  BNP is mildly elevated.  INR is still subtherapeutic at 1.46.  He is referred back to the Coumadin clinic for instructions on how to adjust his dose.  He is discharged with  prescription for furosemide to resume taking it twice a day as before.  Return precautions discussed.  Final Clinical Impressions(s) / ED Diagnoses   Final diagnoses:  Acute on chronic diastolic heart failure ([place])  Subtherapeutic international normalized ratio (INR)  Normochromic normocytic anemia    ED Discharge Orders        Ordered    furosemide (LASIX) 40 MG tablet  2 times daily     16/10/96 0454       Delora Fuel, MD 09/81/19 1478    Delora Fuel, MD 29/56/21 [phone]

## 2017-08-26 NOTE — ED Notes (Signed)
Nurse starting IV and drawing labs. 

## 2017-08-26 NOTE — Discharge Instructions (Addendum)
Your INR (blood test for your blood thinner) was 1.46.  It should be 2.5-3.5.  Please call the clinic that monitors your blood thinner to get instructions on how to adjust your dose.  Return if symptoms are getting worse.

## 2017-08-26 NOTE — ED Notes (Signed)
ED Provider at bedside. 

## 2017-08-31 ENCOUNTER — Ambulatory Visit (INDEPENDENT_AMBULATORY_CARE_PROVIDER_SITE_OTHER): Payer: Medicare HMO | Admitting: Internal Medicine

## 2017-08-31 ENCOUNTER — Encounter: Payer: Self-pay | Admitting: Internal Medicine

## 2017-08-31 ENCOUNTER — Other Ambulatory Visit: Payer: Self-pay | Admitting: Internal Medicine

## 2017-08-31 ENCOUNTER — Other Ambulatory Visit (INDEPENDENT_AMBULATORY_CARE_PROVIDER_SITE_OTHER): Payer: Medicare HMO

## 2017-08-31 VITALS — BP 126/78 | HR 100 | Temp 97.9°F | Ht 74.0 in | Wt 299.0 lb

## 2017-08-31 DIAGNOSIS — E119 Type 2 diabetes mellitus without complications: Secondary | ICD-10-CM

## 2017-08-31 DIAGNOSIS — E559 Vitamin D deficiency, unspecified: Secondary | ICD-10-CM | POA: Diagnosis not present

## 2017-08-31 DIAGNOSIS — Z114 Encounter for screening for human immunodeficiency virus [HIV]: Secondary | ICD-10-CM

## 2017-08-31 DIAGNOSIS — D649 Anemia, unspecified: Secondary | ICD-10-CM

## 2017-08-31 DIAGNOSIS — I10 Essential (primary) hypertension: Secondary | ICD-10-CM

## 2017-08-31 DIAGNOSIS — I5033 Acute on chronic diastolic (congestive) heart failure: Secondary | ICD-10-CM

## 2017-08-31 DIAGNOSIS — Z0001 Encounter for general adult medical examination with abnormal findings: Secondary | ICD-10-CM | POA: Diagnosis not present

## 2017-08-31 LAB — CBC WITH DIFFERENTIAL/PLATELET
Basophils Absolute: 0 10*3/uL (ref 0.0–0.1)
Basophils Relative: 0.7 % (ref 0.0–3.0)
EOS ABS: 0.2 10*3/uL (ref 0.0–0.7)
Eosinophils Relative: 4.6 % (ref 0.0–5.0)
HCT: 39.8 % (ref 39.0–52.0)
Hemoglobin: 13.7 g/dL (ref 13.0–17.0)
LYMPHS ABS: 1.4 10*3/uL (ref 0.7–4.0)
Lymphocytes Relative: 26.7 % (ref 12.0–46.0)
MCHC: 34.4 g/dL (ref 30.0–36.0)
MCV: 82.6 fl (ref 78.0–100.0)
MONO ABS: 0.5 10*3/uL (ref 0.1–1.0)
Monocytes Relative: 9.1 % (ref 3.0–12.0)
NEUTROS ABS: 3 10*3/uL (ref 1.4–7.7)
NEUTROS PCT: 58.9 % (ref 43.0–77.0)
PLATELETS: 180 10*3/uL (ref 150.0–400.0)
RBC: 4.82 Mil/uL (ref 4.22–5.81)
RDW: 15.4 % (ref 11.5–15.5)
WBC: 5.1 10*3/uL (ref 4.0–10.5)

## 2017-08-31 LAB — URINALYSIS, ROUTINE W REFLEX MICROSCOPIC
BILIRUBIN URINE: NEGATIVE
Hgb urine dipstick: NEGATIVE
Ketones, ur: NEGATIVE
LEUKOCYTES UA: NEGATIVE
Nitrite: NEGATIVE
RBC / HPF: NONE SEEN (ref 0–?)
Specific Gravity, Urine: <=1.005 — AB (ref 1.000–1.030)
TOTAL PROTEIN, URINE-UPE24: NEGATIVE
URINE GLUCOSE: 100 — AB
Urobilinogen, UA: 1 (ref 0.0–1.0)
pH: 6 (ref 5.0–8.0)

## 2017-08-31 LAB — BASIC METABOLIC PANEL
BUN: 15 mg/dL (ref 6–23)
CHLORIDE: 99 meq/L (ref 96–112)
CO2: 27 meq/L (ref 19–32)
CREATININE: 1.33 mg/dL (ref 0.40–1.50)
Calcium: 9 mg/dL (ref 8.4–10.5)
GFR: 72.46 mL/min (ref >60.00)
Glucose, Bld: 292 mg/dL — ABNORMAL HIGH (ref 70–99)
Potassium: 3.7 mEq/L (ref 3.5–5.1)
Sodium: 136 mEq/L (ref 135–145)

## 2017-08-31 LAB — HEPATIC FUNCTION PANEL
ALK PHOS: 76 U/L (ref 39–117)
ALT: 30 U/L (ref 0–53)
AST: 25 U/L (ref 0–37)
Albumin: 4 g/dL (ref 3.5–5.2)
BILIRUBIN DIRECT: 0.2 mg/dL (ref 0.0–0.3)
BILIRUBIN TOTAL: 1.3 mg/dL — AB (ref 0.2–1.2)
Total Protein: 7.6 g/dL (ref 6.0–8.3)

## 2017-08-31 LAB — TSH: TSH: 3.69 u[IU]/mL (ref 0.35–4.50)

## 2017-08-31 LAB — LIPID PANEL
CHOL/HDL RATIO: 4
Cholesterol: 133 mg/dL (ref 0–200)
HDL: 31.9 mg/dL — ABNORMAL LOW (ref >39.00)
LDL CALC: 72 mg/dL (ref 0–99)
NonHDL: 101.22
TRIGLYCERIDES: 146 mg/dL (ref 0.0–149.0)
VLDL: 29.2 mg/dL (ref 0.0–40.0)

## 2017-08-31 LAB — VITAMIN B12: Vitamin B-12: 260 pg/mL (ref 211–911)

## 2017-08-31 LAB — PSA: PSA: 0.97 ng/mL (ref 0.10–4.00)

## 2017-08-31 LAB — MICROALBUMIN / CREATININE URINE RATIO
Creatinine,U: 52.5 mg/dL
Microalb Creat Ratio: 6.9 mg/g (ref 0.0–30.0)
Microalb, Ur: 3.6 mg/dL — ABNORMAL HIGH (ref 0.0–1.9)

## 2017-08-31 LAB — BRAIN NATRIURETIC PEPTIDE: PRO B NATRI PEPTIDE: 116 pg/mL — AB (ref 0.0–100.0)

## 2017-08-31 LAB — IBC PANEL
Iron: 63 ug/dL (ref 42–165)
SATURATION RATIOS: 18.5 % — AB (ref 20.0–50.0)
TRANSFERRIN: 243 mg/dL (ref 212.0–360.0)

## 2017-08-31 LAB — VITAMIN D 25 HYDROXY (VIT D DEFICIENCY, FRACTURES): VITD: 9.49 ng/mL — ABNORMAL LOW (ref 30.00–100.00)

## 2017-08-31 MED ORDER — VITAMIN D (ERGOCALCIFEROL) 1.25 MG (50000 UNIT) PO CAPS: 50000.0000 [IU] | ORAL_CAPSULE | ORAL | 0 refills | Status: DC

## 2017-08-31 NOTE — Progress Notes (Signed)
Subjective:    Patient ID: [name], male    DOB: 1966-02-14, 52 y.o.   MRN: [id number]  HPI  Here for wellness and f/u;  Overall doing ok;  Pt denies Chest pain, worsening SOB, DOE, wheezing, orthopnea, PND, worsening LE edema, palpitations, dizziness or syncope.  Pt denies neurological change such as new headache, facial or extremity weakness.  Pt denies polydipsia, polyuria, or low sugar symptoms. Pt states overall good compliance with treatment and medications, good tolerability, and has been trying to follow appropriate diet.  Pt denies worsening depressive symptoms, suicidal ideation or panic. No fever, night sweats, wt loss, loss of appetite, or other constitutional symptoms.  Pt states good ability with ADL's, has low fall risk, home safety reviewed and adequate, no other significant changes in hearing or vision, and only occasionally active with exercise. Alos just seen in ED with SOB and dx of acute on chronic dCHF apr 6 after ran out of diuretic.Also hx of subtherapeutic INR twice.   Restarted lasix, now back to baseline, and leg swelling improved.  BP better as well, but wt here is higher fo some reason, pt states wt at home without clothes was 287.   BP Readings from Last 3 Encounters:  08/31/17 126/78  08/25/17 (!) 152/125  08/25/17 (!) 144/102   Wt Readings from Last 3 Encounters:  08/31/17 299 lb (135.6 kg)  08/25/17 288 lb (130.6 kg)  08/24/17 288 lb (130.6 kg)  Declines pneumonia shot, and colonoscopy as he did cologuard last yr, neg. Did not attend last optho appt after referred by me, but wiling to do now.   No new complaints or interval hx.  INR was 1.46 on apr 5, and has f/u appt with coumadin clinic tomorrow.   Past Medical History:  Diagnosis Date  . Ascending aortic dissection ([place])    a. 2000 - with aortic valve involvement. S/p repair of aortic dissection with placement of mechanical AVR.  [name] CAD (coronary artery disease)    a. 2000 VG->PDA @ time of Ao  dissection repair;  b. Cardiac CT 06/2010: no CAD, only mild plaque in prox RCA;  c. 2015 Cath: LM nl, LAD nl, LCX nl, RCA 100, VG->PDA 90 (4.5x12 Rebel BMS).  . Cholelithiasis 08/22/2013  . Chronic diastolic CHF (congestive heart failure) ([place])    a. 03/2016 Echo: EF 55-60%, no rwma, Gr2 DD.  . Diabetes mellitus   . Dyspnea   . Gross hematuria   . H/O mechanical aortic valve replacement    a. 03/2016 Echo: AoV mean gradient 69mmHg, Valve Area (VTI) 1.36 cm^2, (Vmax) 1.22 cm^2, mod dil LA.  [name] Hyperlipidemia   . Hypertension   . NEPHROLITHIASIS, HX OF   . Obesity   . PAF (paroxysmal atrial fibrillation) ([place])    a. H/o such, with recurrence of coarse afib/flutter during ER consult 03/2012.  [name] TRANSIENT ISCHEMIC ATTACK, HX OF    a. In 2004.  [name] Unspecified vitamin D deficiency    Past Surgical History:  Procedure Laterality Date  . AORTIC VALVE REPLACEMENT  2000  . APPENDECTOMY  1998  . CORONARY ARTERY BYPASS GRAFT  2000  . LEFT AND RIGHT HEART CATHETERIZATION WITH CORONARY ANGIOGRAM N/A 07/19/2013   Procedure: LEFT AND RIGHT HEART CATHETERIZATION WITH CORONARY ANGIOGRAM;  Surgeon: [name], MD;  Location: [facility] CATH LAB;  Service: Cardiovascular;  Laterality: N/A;  . TEE WITHOUT CARDIOVERSION N/A 07/22/2013   Procedure: TRANSESOPHAGEAL ECHOCARDIOGRAM (TEE);  Surgeon: [name], MD;  Location: [facility] ENDOSCOPY;  Service: Cardiovascular;  Laterality: N/A;    reports that he has never smoked. He has never used smokeless tobacco. He reports that he does not drink alcohol or use drugs. family history includes Coronary artery disease in his mother; Coronary artery disease (age of onset: 61) in his other; Diabetes in his brother; Hypertension in his mother. Allergies  Allergen Reactions  . Insulin Glargine Swelling    edema   Current Outpatient Medications on File Prior to Visit  Medication Sig Dispense Refill  . cholecalciferol (VITAMIN D) 1000 UNITS tablet Take 1,000 Units by mouth  daily.    . cloNIDine (CATAPRES) 0.1 MG tablet TAKE 1 TABLET TWICE DAILY 180 tablet 3  . furosemide (LASIX) 40 MG tablet Take 1 tablet (40 mg total) by mouth 2 (two) times daily. 60 tablet 0  . glimepiride (AMARYL) 1 MG tablet TAKE 3 TABLETS ONE TIME DAILY WITH BREAKFAST 270 tablet 3  . levothyroxine (SYNTHROID, LEVOTHROID) 50 MCG tablet TAKE 1 TABLET EVERY DAY 90 tablet 3  . losartan (COZAAR) 100 MG tablet TAKE 1 TABLET EVERY DAY 90 tablet 3  . metFORMIN (GLUCOPHAGE) 1000 MG tablet TAKE 1 TABLET TWICE DAILY WITH MEALS 180 tablet 1  . metoprolol (TOPROL-XL) 200 MG 24 hr tablet Take 1 tablet (200 mg total) by mouth daily. 90 tablet 3  . nitroGLYCERIN (NITROSTAT) 0.4 MG SL tablet Place 1 tablet (0.4 mg total) under the tongue every 5 (five) minutes x 3 doses as needed for chest pain. 25 tablet 2  . Potassium Chloride ER 20 MEQ TBCR TAKE 2 TABLETS EVERY DAY 180 tablet 0  . pravastatin (PRAVACHOL) 40 MG tablet TAKE 1 TABLET EVERY DAY 90 tablet 3  . sitaGLIPtin (JANUVIA) 100 MG tablet Take 1 tablet (100 mg total) by mouth daily. 30 tablet 11  . warfarin (COUMADIN) 5 MG tablet TAKE AS DIRECTED BY COUMADIN CLINIC (Patient taking differently: TAKE 5 MG BY MOUTH DAILY) 135 tablet 1   No current facility-administered medications on file prior to visit.    Review of Systems Constitutional: Negative for other unusual diaphoresis, sweats, appetite or weight changes HENT: Negative for other worsening hearing loss, ear pain, facial swelling, mouth sores or neck stiffness.   Eyes: Negative for other worsening pain, redness or other visual disturbance.  Respiratory: Negative for other stridor or swelling Cardiovascular: Negative for other palpitations or other chest pain  Gastrointestinal: Negative for worsening diarrhea or loose stools, blood in stool, distention or other pain Genitourinary: Negative for hematuria, flank pain or other change in urine volume.  Musculoskeletal: Negative for myalgias or other  joint swelling.  Skin: Negative for other color change, or other wound or worsening drainage.  Neurological: Negative for other syncope or numbness. Hematological: Negative for other adenopathy or swelling Psychiatric/Behavioral: Negative for hallucinations, other worsening agitation, SI, self-injury, or new decreased concentration All other system neg per pt    Objective:   Physical Exam BP 126/78   Pulse 100   Temp 97.9 F (36.6 C) (Oral)   Ht 6\' 2"  (1.88 m)   Wt 299 lb (135.6 kg)   SpO2 96%   BMI 38.39 kg/m  VS noted,  Constitutional: Pt is oriented to person, place, and time. Appears well-developed and well-nourished, in no significant distress and comfortable Head: Normocephalic and atraumatic  Eyes: Conjunctivae and EOM are normal. Pupils are equal, round, and reactive to light Right Ear: External ear normal without discharge Left Ear: External ear normal without discharge Nose: Nose  without discharge or deformity Mouth/Throat: Oropharynx is without other ulcerations and moist  Neck: Normal range of motion. Neck supple. No JVD present. No tracheal deviation present or significant neck LA or mass Cardiovascular: Normal rate, regular rhythm, normal heart sounds and intact distal pulses.   Pulmonary/Chest: WOB normal and breath sounds without rales or wheezing  Abdominal: Soft. Bowel sounds are normal. NT. No HSM  Musculoskeletal: Normal range of motion. Exhibits no edema to LLE Lymphadenopathy: Has no other cervical adenopathy.  Neurological: Pt is alert and oriented to person, place, and time. Pt has normal reflexes. No cranial nerve deficit. Motor grossly intact, Gait intact Skin: Skin is warm and dry. No rash noted or new ulcerations Psychiatric:  Has normal mood and affect. Behavior is normal without agitation Has trace right ankle edema only  hgb appears to be drifting from nov 2017 15.9 - now 11.6 (new for him) Lab Results  Component Value Date   WBC 4.9 08/26/2017     HGB 11.6 (L) 08/26/2017   HCT 34.2 (L) 08/26/2017   PLT 135 (L) 08/26/2017   GLUCOSE 174 (H) 08/26/2017   CHOL 168 02/28/2017   TRIG 261.0 (H) 02/28/2017   HDL 36.90 (L) 02/28/2017   LDLDIRECT 88.0 02/28/2017   LDLCALC 75 02/21/2014   ALT 44 08/24/2017   AST 39 08/24/2017   NA 139 08/26/2017   K 4.0 08/26/2017   CL 110 08/26/2017   CREATININE 1.09 08/26/2017   BUN 11 08/26/2017   CO2 24 08/26/2017   TSH 4.61 (H) 02/28/2017   PSA 0.68 02/25/2016   INR 1.46 08/26/2017   HGBA1C 10.4 (H) 02/28/2017   MICROALBUR 6.6 (H) 02/28/2017   08/26/2017 1:04 AM 08/26/2017 1:10 AM  PACS Images   Show images for DG Chest 2 View  Study Result   CLINICAL DATA:  Shortness of breath and chest pain for 3 days.  EXAM: CHEST - 2 VIEW  COMPARISON:  08/24/2017  FINDINGS: Postoperative changes in the mediastinum. Shallow inspiration. Cardiac enlargement with mild pulmonary vascular congestion. No edema or consolidation. No blunting of costophrenic angles. No pneumothorax. Mediastinal contours appear intact.  IMPRESSION: Cardiac enlargement with mild vascular congestion. No edema or consolidation.   Electronically Signed   By: Lucienne Capers M.D.   On: 08/26/2017 01:21      Assessment & Plan:

## 2017-08-31 NOTE — Patient Instructions (Addendum)
You will be contacted regarding the referral for: Eye doctor  Please continue all other medications as before, and refills have been done if requested.  Please have the pharmacy call with any other refills you may need.  Please continue your efforts at being more active, low cholesterol diet, and weight control.  You are otherwise up to date with prevention measures today.  Please keep your appointments with your specialists as you may have planned - coumadin clinic tomorrow  Please go to the LAB in the Basement (turn left off the elevator) for the tests to be done today  You will be contacted by phone if any changes need to be made immediately.  Otherwise, you will receive a letter about your results with an explanation, but please check with MyChart first.  Please remember to sign up for MyChart if you have not done so, as this will be important to you in the future with finding out test results, communicating by private email, and scheduling acute appointments online when needed.  Please return in 6 months, or sooner if needed, with Lab testing done 3-5 days before

## 2017-09-01 ENCOUNTER — Telehealth: Payer: Self-pay

## 2017-09-01 ENCOUNTER — Ambulatory Visit (INDEPENDENT_AMBULATORY_CARE_PROVIDER_SITE_OTHER): Payer: Medicare HMO | Admitting: *Deleted

## 2017-09-01 DIAGNOSIS — Z8679 Personal history of other diseases of the circulatory system: Secondary | ICD-10-CM

## 2017-09-01 DIAGNOSIS — Z5181 Encounter for therapeutic drug level monitoring: Secondary | ICD-10-CM | POA: Diagnosis not present

## 2017-09-01 DIAGNOSIS — I480 Paroxysmal atrial fibrillation: Secondary | ICD-10-CM | POA: Diagnosis not present

## 2017-09-01 DIAGNOSIS — E119 Type 2 diabetes mellitus without complications: Secondary | ICD-10-CM

## 2017-09-01 LAB — HEMOGLOBIN A1C
Hgb A1c MFr Bld: 10.9 % of total Hgb — ABNORMAL HIGH (ref <5.7)
Mean Plasma Glucose: 266 (calc)
eAG (mmol/L): 14.7 (calc)

## 2017-09-01 LAB — POCT INR: INR: 1.9

## 2017-09-01 LAB — HIV ANTIBODY (ROUTINE TESTING W REFLEX): HIV 1&2 Ab, 4th Generation: NONREACTIVE

## 2017-09-01 NOTE — Addendum Note (Signed)
Addended by: Biagio Borg on: 09/01/2017 04:52 PM   Modules accepted: Orders

## 2017-09-01 NOTE — Telephone Encounter (Signed)
[place] for endo referral - done

## 2017-09-01 NOTE — Telephone Encounter (Signed)
Pt has been informed and expressed understanding, he said he will not take insulin, he said that we could do the referral to endo for his to see alternative options.

## 2017-09-01 NOTE — Telephone Encounter (Signed)
-----   Message from Biagio Borg, MD sent at 08/31/2017  8:10 PM EDT ----- Left message on MyChart, pt to cont same tx except  The test results show that your current treatment is OK, except the Vitamin D level is very low.  Please take a short course of high dose Vitamin D 50000 units weekly for 12 weeks.  Then take OTC Vitamin D at 2000 units per day after that.Redmond Baseman to please inform pt, I will do rx

## 2017-09-01 NOTE — Telephone Encounter (Signed)
-----   Message from Biagio Borg, MD sent at 09/01/2017 12:45 PM EDT ----- Left message on MyChart, pt to cont same tx except  The test results show that your current treatment is OK, except the A1c is quite a bit higher than last year.  I think you have been taking of your medications, so with the A1c being so high, we need to ask you to return for office visit to start insulin.  Another option would be a referral to Endocrinology if you like.  You should hear from the office about this.Redmond Baseman to please inform pt, needs ROV to start insulin, or referral to endo if he wants

## 2017-09-01 NOTE — Patient Instructions (Signed)
Description   Today take 2 tablets, then continue taking 1 tablet daily except 1.5 tablets only on Mondays,Wednesday, and Fridays.  Recheck INR in 2 weeks.  Call us with any new medications or concerns [phone] [facility] [phone].

## 2017-09-01 NOTE — Telephone Encounter (Signed)
Pt has been informed and expressed understanding.  

## 2017-09-02 NOTE — Assessment & Plan Note (Signed)
S.table BP Readings from Last 3 Encounters:  08/31/17 126/78  08/25/17 (!) 152/125  08/25/17 (!) 144/102

## 2017-09-02 NOTE — Assessment & Plan Note (Signed)
stable overall by history and exam, recent data reviewed with pt, and pt to continue medical treatment as before,  to f/u any worsening symptoms or concerns, for f/u lab today 

## 2017-09-02 NOTE — Assessment & Plan Note (Addendum)
Overall appaers improved, for BNP  In addition to the time spent performing CPE, I spent an additional 15 minutes face to face,in which greater than 50% of this time was spent in counseling and coordination of care for patient's illness as documented, including the differential dx, treatment, further evaluation and other management of dCHF, anemia, DM, HTN

## 2017-09-02 NOTE — Assessment & Plan Note (Signed)

## 2017-09-02 NOTE — Assessment & Plan Note (Signed)
With recent worsening for B12 and iron levels

## 2017-09-12 ENCOUNTER — Other Ambulatory Visit (INDEPENDENT_AMBULATORY_CARE_PROVIDER_SITE_OTHER): Payer: Medicare HMO

## 2017-09-12 DIAGNOSIS — D649 Anemia, unspecified: Secondary | ICD-10-CM

## 2017-09-12 LAB — FECAL OCCULT BLOOD, IMMUNOCHEMICAL: FECAL OCCULT BLD: NEGATIVE

## 2017-09-15 ENCOUNTER — Ambulatory Visit (INDEPENDENT_AMBULATORY_CARE_PROVIDER_SITE_OTHER): Payer: Medicare HMO | Admitting: *Deleted

## 2017-09-15 ENCOUNTER — Telehealth: Payer: Self-pay | Admitting: Internal Medicine

## 2017-09-15 DIAGNOSIS — I480 Paroxysmal atrial fibrillation: Secondary | ICD-10-CM | POA: Diagnosis not present

## 2017-09-15 DIAGNOSIS — Z5181 Encounter for therapeutic drug level monitoring: Secondary | ICD-10-CM

## 2017-09-15 DIAGNOSIS — Z8679 Personal history of other diseases of the circulatory system: Secondary | ICD-10-CM

## 2017-09-15 LAB — POCT INR: INR: 2.3

## 2017-09-15 NOTE — Telephone Encounter (Signed)
patient calling wanting Dr Jenny Reichmann to know that hes been taking the cholecalciferol (VITAMIN D) 1000 UNITS tablet Sig - Route: Take 1,000 Units by mouth daily. But the directions say once every 7 days and want to know if it will mess anything up

## 2017-09-15 NOTE — Patient Instructions (Signed)
Description   Continue taking 1 tablet daily except 1.5 tablets only on Mondays,Wednesday, and Fridays. Recheck INR in 3 weeks.  Call us with any new medications or concerns [phone] [facility] [phone].

## 2017-09-15 NOTE — Telephone Encounter (Signed)
Pt has been informed and expressed understanding.  

## 2017-09-15 NOTE — Telephone Encounter (Signed)
The rx should read take 50,000 units weekly of Vit D for 12 wks only, then change to OTC 1000 units per day

## 2017-09-27 DIAGNOSIS — H01022 Squamous blepharitis right lower eyelid: Secondary | ICD-10-CM | POA: Diagnosis not present

## 2017-09-27 DIAGNOSIS — H01024 Squamous blepharitis left upper eyelid: Secondary | ICD-10-CM | POA: Diagnosis not present

## 2017-09-27 DIAGNOSIS — H01021 Squamous blepharitis right upper eyelid: Secondary | ICD-10-CM | POA: Diagnosis not present

## 2017-09-27 DIAGNOSIS — H01025 Squamous blepharitis left lower eyelid: Secondary | ICD-10-CM | POA: Diagnosis not present

## 2017-09-27 DIAGNOSIS — E113293 Type 2 diabetes mellitus with mild nonproliferative diabetic retinopathy without macular edema, bilateral: Secondary | ICD-10-CM | POA: Diagnosis not present

## 2017-09-27 DIAGNOSIS — H10413 Chronic giant papillary conjunctivitis, bilateral: Secondary | ICD-10-CM | POA: Diagnosis not present

## 2017-09-27 LAB — HM DIABETES EYE EXAM

## 2017-10-06 ENCOUNTER — Ambulatory Visit (INDEPENDENT_AMBULATORY_CARE_PROVIDER_SITE_OTHER): Payer: Medicare HMO | Admitting: *Deleted

## 2017-10-06 DIAGNOSIS — Z5181 Encounter for therapeutic drug level monitoring: Secondary | ICD-10-CM

## 2017-10-06 DIAGNOSIS — I480 Paroxysmal atrial fibrillation: Secondary | ICD-10-CM | POA: Diagnosis not present

## 2017-10-06 DIAGNOSIS — Z8679 Personal history of other diseases of the circulatory system: Secondary | ICD-10-CM

## 2017-10-06 LAB — POCT INR: INR: 2.6

## 2017-10-06 NOTE — Patient Instructions (Signed)
Description   Continue taking 1 tablet daily except 1.5 tablets only on Mondays,Wednesday, and Fridays. Recheck INR in 4 weeks.  Call us with any new medications or concerns [phone] [facility] [phone].

## 2017-10-21 ENCOUNTER — Encounter (HOSPITAL_COMMUNITY): Payer: Self-pay | Admitting: Emergency Medicine

## 2017-10-21 ENCOUNTER — Inpatient Hospital Stay (HOSPITAL_COMMUNITY)
Admission: EM | Admit: 2017-10-21 | Discharge: 2017-10-26 | DRG: 247 | Disposition: A | Discharge status: Home / Self Care | Payer: Medicare HMO | Attending: Cardiology | Admitting: Cardiology

## 2017-10-21 ENCOUNTER — Emergency Department (HOSPITAL_COMMUNITY): Payer: Medicare HMO

## 2017-10-21 ENCOUNTER — Other Ambulatory Visit: Payer: Self-pay

## 2017-10-21 DIAGNOSIS — E039 Hypothyroidism, unspecified: Secondary | ICD-10-CM | POA: Diagnosis present

## 2017-10-21 DIAGNOSIS — I5023 Acute on chronic systolic (congestive) heart failure: Secondary | ICD-10-CM | POA: Diagnosis not present

## 2017-10-21 DIAGNOSIS — I340 Nonrheumatic mitral (valve) insufficiency: Secondary | ICD-10-CM | POA: Diagnosis not present

## 2017-10-21 DIAGNOSIS — Z7901 Long term (current) use of anticoagulants: Secondary | ICD-10-CM | POA: Diagnosis not present

## 2017-10-21 DIAGNOSIS — E876 Hypokalemia: Secondary | ICD-10-CM | POA: Diagnosis present

## 2017-10-21 DIAGNOSIS — Z833 Family history of diabetes mellitus: Secondary | ICD-10-CM

## 2017-10-21 DIAGNOSIS — I110 Hypertensive heart disease with heart failure: Secondary | ICD-10-CM | POA: Diagnosis present

## 2017-10-21 DIAGNOSIS — Z955 Presence of coronary angioplasty implant and graft: Secondary | ICD-10-CM | POA: Diagnosis not present

## 2017-10-21 DIAGNOSIS — Z79899 Other long term (current) drug therapy: Secondary | ICD-10-CM

## 2017-10-21 DIAGNOSIS — Z8679 Personal history of other diseases of the circulatory system: Secondary | ICD-10-CM

## 2017-10-21 DIAGNOSIS — I25110 Atherosclerotic heart disease of native coronary artery with unstable angina pectoris: Secondary | ICD-10-CM | POA: Diagnosis present

## 2017-10-21 DIAGNOSIS — Z9049 Acquired absence of other specified parts of digestive tract: Secondary | ICD-10-CM

## 2017-10-21 DIAGNOSIS — Z8249 Family history of ischemic heart disease and other diseases of the circulatory system: Secondary | ICD-10-CM | POA: Diagnosis not present

## 2017-10-21 DIAGNOSIS — I5042 Chronic combined systolic (congestive) and diastolic (congestive) heart failure: Secondary | ICD-10-CM | POA: Diagnosis present

## 2017-10-21 DIAGNOSIS — I5031 Acute diastolic (congestive) heart failure: Secondary | ICD-10-CM | POA: Diagnosis not present

## 2017-10-21 DIAGNOSIS — Z79890 Hormone replacement therapy (postmenopausal): Secondary | ICD-10-CM

## 2017-10-21 DIAGNOSIS — I200 Unstable angina: Secondary | ICD-10-CM | POA: Diagnosis not present

## 2017-10-21 DIAGNOSIS — Z952 Presence of prosthetic heart valve: Secondary | ICD-10-CM

## 2017-10-21 DIAGNOSIS — Z87442 Personal history of urinary calculi: Secondary | ICD-10-CM

## 2017-10-21 DIAGNOSIS — I4892 Unspecified atrial flutter: Secondary | ICD-10-CM | POA: Diagnosis present

## 2017-10-21 DIAGNOSIS — E559 Vitamin D deficiency, unspecified: Secondary | ICD-10-CM | POA: Diagnosis present

## 2017-10-21 DIAGNOSIS — T82855A Stenosis of coronary artery stent, initial encounter: Secondary | ICD-10-CM | POA: Diagnosis present

## 2017-10-21 DIAGNOSIS — I272 Pulmonary hypertension, unspecified: Secondary | ICD-10-CM | POA: Diagnosis present

## 2017-10-21 DIAGNOSIS — I482 Chronic atrial fibrillation: Secondary | ICD-10-CM | POA: Diagnosis present

## 2017-10-21 DIAGNOSIS — Z888 Allergy status to other drugs, medicaments and biological substances status: Secondary | ICD-10-CM

## 2017-10-21 DIAGNOSIS — Z8673 Personal history of transient ischemic attack (TIA), and cerebral infarction without residual deficits: Secondary | ICD-10-CM

## 2017-10-21 DIAGNOSIS — R791 Abnormal coagulation profile: Secondary | ICD-10-CM | POA: Diagnosis present

## 2017-10-21 DIAGNOSIS — I10 Essential (primary) hypertension: Secondary | ICD-10-CM | POA: Diagnosis not present

## 2017-10-21 DIAGNOSIS — R0609 Other forms of dyspnea: Secondary | ICD-10-CM | POA: Diagnosis not present

## 2017-10-21 DIAGNOSIS — E7800 Pure hypercholesterolemia, unspecified: Secondary | ICD-10-CM | POA: Diagnosis not present

## 2017-10-21 DIAGNOSIS — I480 Paroxysmal atrial fibrillation: Secondary | ICD-10-CM | POA: Diagnosis present

## 2017-10-21 DIAGNOSIS — R0602 Shortness of breath: Secondary | ICD-10-CM

## 2017-10-21 DIAGNOSIS — Y831 Surgical operation with implant of artificial internal device as the cause of abnormal reaction of the patient, or of later complication, without mention of misadventure at the time of the procedure: Secondary | ICD-10-CM | POA: Diagnosis present

## 2017-10-21 DIAGNOSIS — E785 Hyperlipidemia, unspecified: Secondary | ICD-10-CM | POA: Diagnosis present

## 2017-10-21 DIAGNOSIS — I25810 Atherosclerosis of coronary artery bypass graft(s) without angina pectoris: Secondary | ICD-10-CM | POA: Diagnosis not present

## 2017-10-21 DIAGNOSIS — E119 Type 2 diabetes mellitus without complications: Secondary | ICD-10-CM | POA: Diagnosis present

## 2017-10-21 DIAGNOSIS — Z951 Presence of aortocoronary bypass graft: Secondary | ICD-10-CM | POA: Diagnosis not present

## 2017-10-21 DIAGNOSIS — Z7984 Long term (current) use of oral hypoglycemic drugs: Secondary | ICD-10-CM

## 2017-10-21 LAB — CBC
HCT: 38.6 % — ABNORMAL LOW (ref 39.0–52.0)
Hemoglobin: 13.3 g/dL (ref 13.0–17.0)
MCH: 27.8 pg (ref 26.0–34.0)
MCHC: 34.5 g/dL (ref 30.0–36.0)
MCV: 80.6 fL (ref 78.0–100.0)
PLATELETS: 206 10*3/uL (ref 150–400)
RBC: 4.79 MIL/uL (ref 4.22–5.81)
RDW: 14.9 % (ref 11.5–15.5)
WBC: 6.4 10*3/uL (ref 4.0–10.5)

## 2017-10-21 LAB — BASIC METABOLIC PANEL
Anion gap: 10 (ref 5–15)
BUN: 10 mg/dL (ref 6–20)
CHLORIDE: 104 mmol/L (ref 101–111)
CO2: 27 mmol/L (ref 22–32)
CREATININE: 1.13 mg/dL (ref 0.61–1.24)
Calcium: 9 mg/dL (ref 8.9–10.3)
GFR calc Af Amer: >60 mL/min (ref >60)
GFR calc non Af Amer: >60 mL/min (ref >60)
Glucose, Bld: 131 mg/dL — ABNORMAL HIGH (ref 65–99)
POTASSIUM: 3.4 mmol/L — AB (ref 3.5–5.1)
SODIUM: 141 mmol/L (ref 135–145)

## 2017-10-21 LAB — PROTIME-INR
INR: 1.8
PROTHROMBIN TIME: 20.7 s — AB (ref 11.4–15.2)

## 2017-10-21 LAB — BRAIN NATRIURETIC PEPTIDE: B Natriuretic Peptide: 134.6 pg/mL — ABNORMAL HIGH (ref 0.0–100.0)

## 2017-10-21 LAB — I-STAT TROPONIN, ED: Troponin i, poc: 0 ng/mL (ref 0.00–0.08)

## 2017-10-21 IMAGING — CR DG CHEST 2V
2 series · 2 of 2 positions shown · non-contrast
Comparison: 08/26/2017

CLINICAL DATA: Shortness of Breath

EXAM:
CHEST - 2 VIEW

[chest pa]
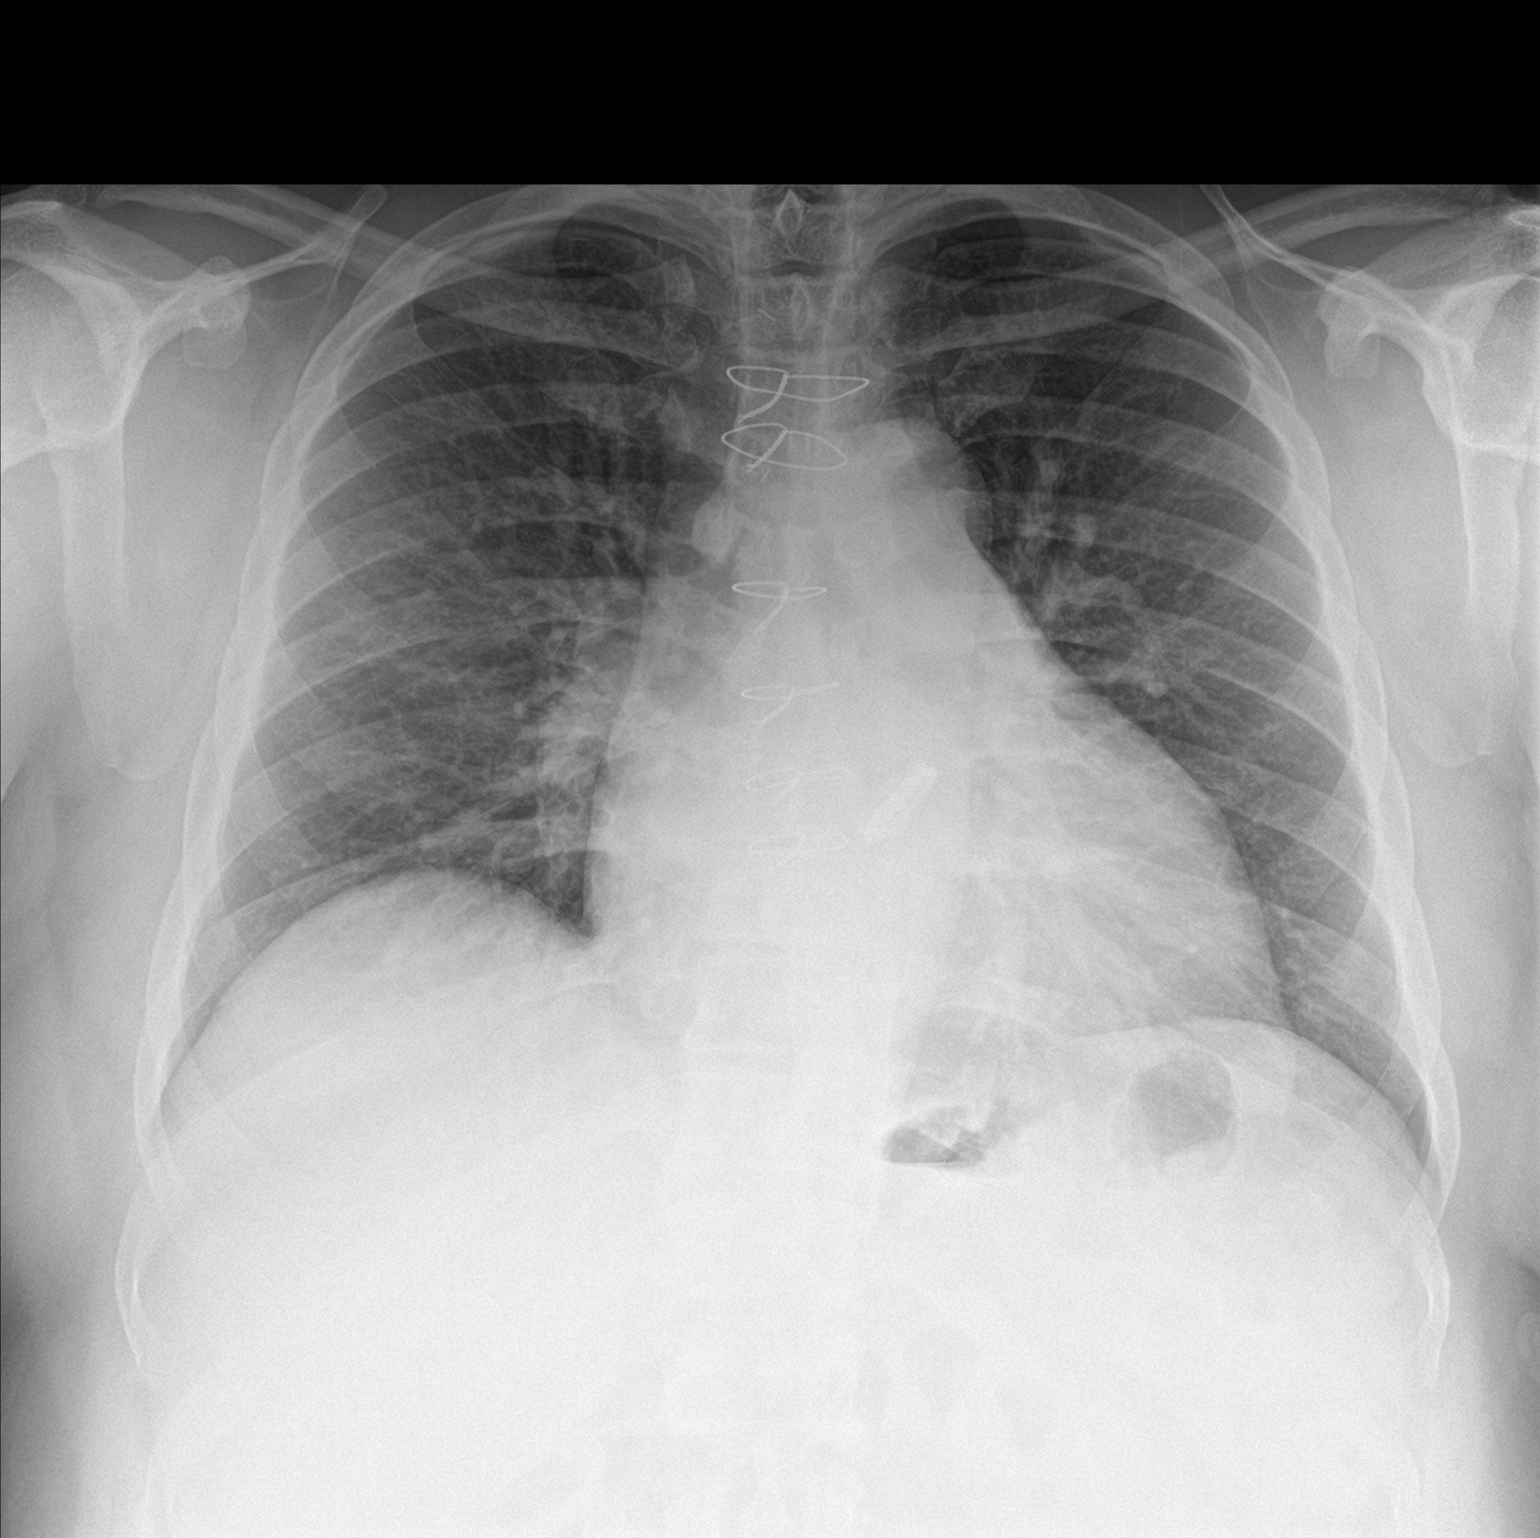

[chest lat]
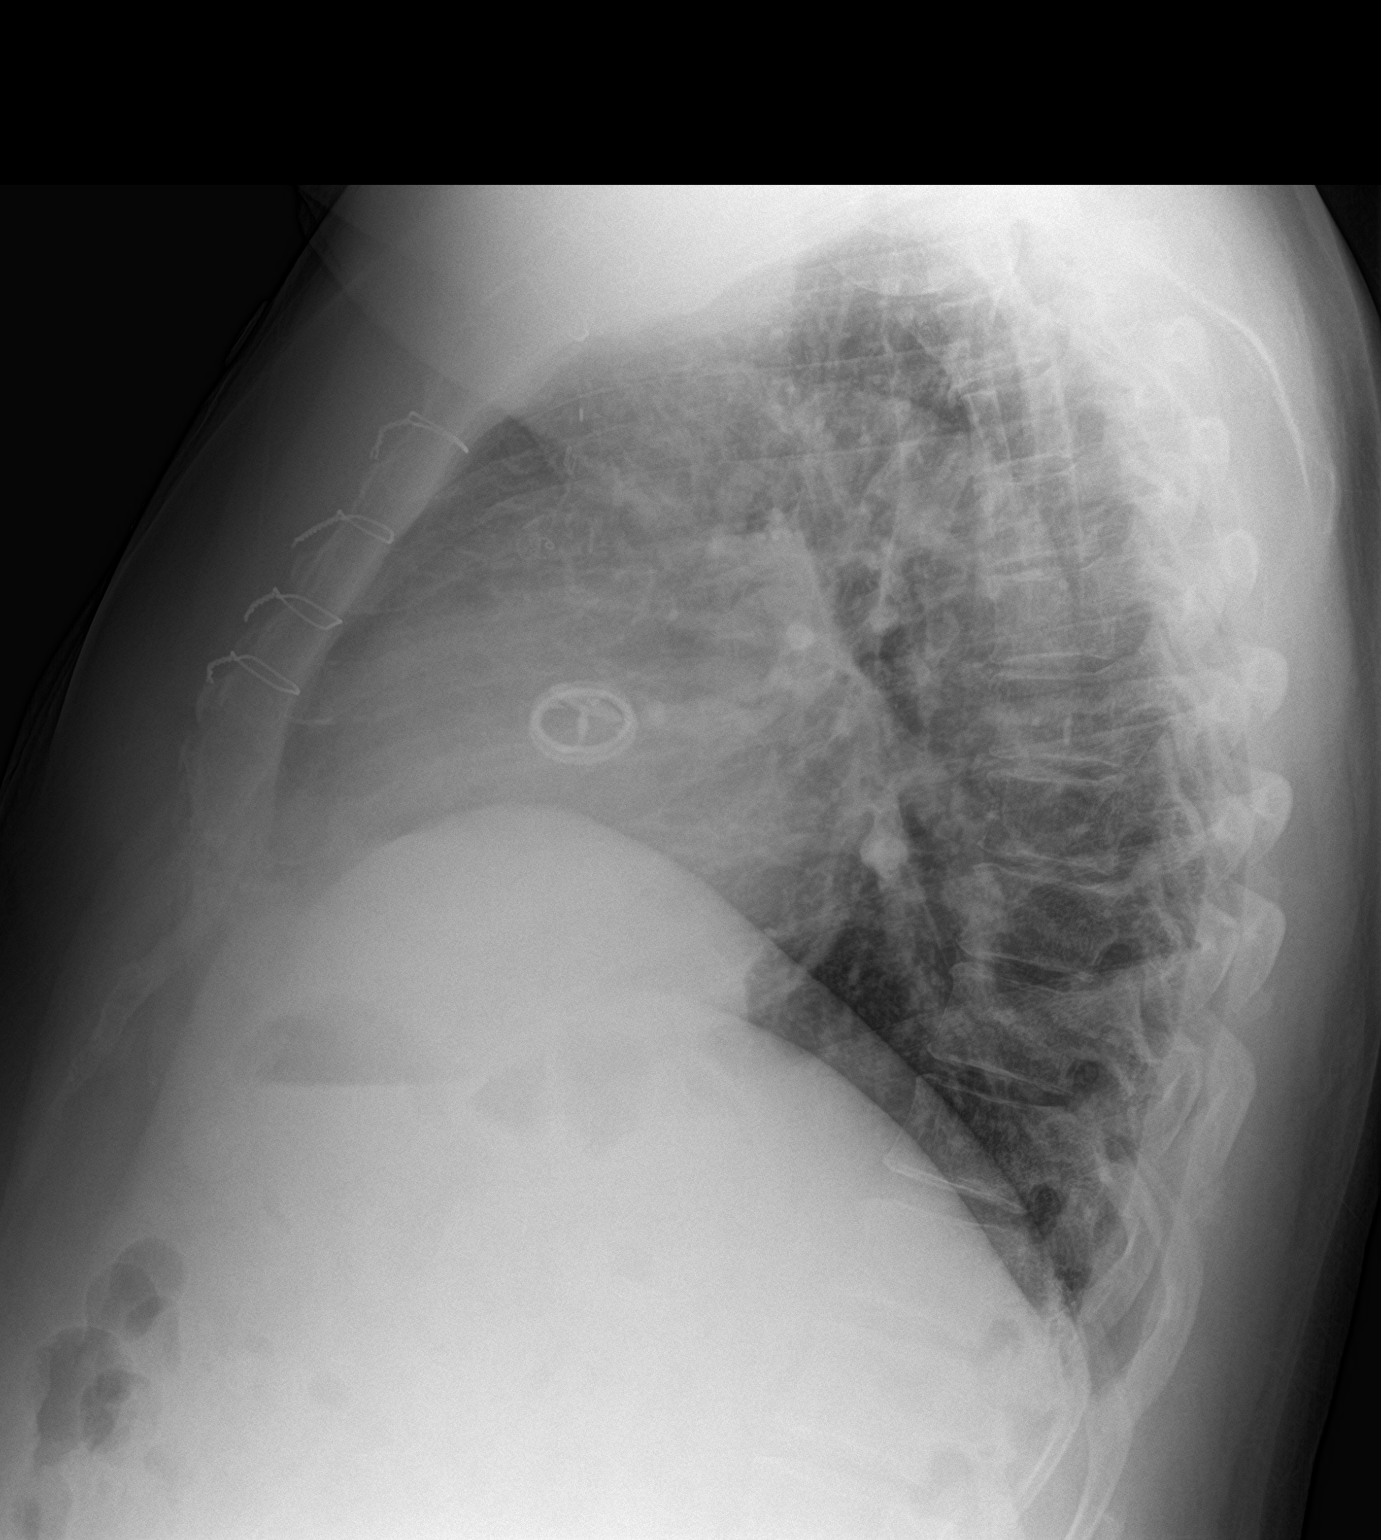

[2 of 2 positions shown; findings below may reference images not displayed]

FINDINGS: Cardiac shadow is mildly enlarged but stable postsurgical changes
are again seen. The lungs are well aerated bilaterally. Mild
vascular congestion is again seen without focal interstitial edema
or sizable effusion. No focal infiltrate is noted. No bony
abnormality is seen.
IMPRESSION: Mild vascular congestion stable in appearance from the prior exam.
No acute abnormality is noted.

## 2017-10-21 MED ORDER — FUROSEMIDE 10 MG/ML IJ SOLN
40.0000 mg | Freq: Once | INTRAMUSCULAR | Status: AC
Start: 2017-10-21 — End: 2017-10-21
  Administered 2017-10-21: 40 mg via INTRAVENOUS
  Filled 2017-10-21: qty 4

## 2017-10-21 MED ORDER — POTASSIUM CHLORIDE CRYS ER 20 MEQ PO TBCR
40.0000 meq | EXTENDED_RELEASE_TABLET | Freq: Once | ORAL | Status: AC
  Administered 2017-10-21: 40 meq via ORAL
  Filled 2017-10-21: qty 2

## 2017-10-21 MED ORDER — MAGNESIUM OXIDE 400 (241.3 MG) MG PO TABS
800.0000 mg | ORAL_TABLET | Freq: Once | ORAL | Status: AC
  Administered 2017-10-21: 800 mg via ORAL
  Filled 2017-10-21: qty 2

## 2017-10-21 NOTE — ED Provider Notes (Addendum)
[facility] EMERGENCY DEPARTMENT Provider Note   CSN: [id number] Arrival date & time: 10/21/17  1429     History   Chief Complaint Chief Complaint  Patient presents with  . Shortness of Breath    HPI [name] is a 52 y.o. male.  52 yo M with a chief complaint of shortness of breath on exertion.  This been going on for the past month.  Is been worsening.  He was seen in the ED about a month ago and was diagnosed with a CHF exacerbation.  Since then the patient has increased his diuretics at home and feels like he is actually lost a significant amount of fluid.  However shortness of breath is not improved.  Worse when he lays back flat improves when he sits up.  Has had some nausea and vomiting denies chest pain or pressure.  Has a history of a stent placed in 2015.  At that time he had no chest pain or pressure.  He is also had a couple episodes where he felt like he was going to blackout, denies actually losing consciousness.   The history is provided by the patient.  Shortness of Breath  This is a recurrent problem. The average episode lasts 1 month. The problem occurs continuously.The current episode started more than 1 week ago. The problem has not changed since onset.Pertinent negatives include no fever, no headaches, no chest pain, no vomiting, no abdominal pain and no rash. Treatments tried: lasix. The treatment provided no relief. He has had prior hospitalizations. He has had prior ED visits. Associated medical issues include heart failure.    Past Medical History:  Diagnosis Date  . Ascending aortic dissection ([place])    a. 2000 - with aortic valve involvement. S/p repair of aortic dissection with placement of mechanical AVR.  [name] CAD (coronary artery disease)    a. 2000 VG->PDA @ time of Ao dissection repair;  b. Cardiac CT 06/2010: no CAD, only mild plaque in prox RCA;  c. 2015 Cath: LM nl, LAD nl, LCX nl, RCA 100, VG->PDA 90 (4.5x12 Rebel BMS).  .  Cholelithiasis 08/22/2013  . Chronic diastolic CHF (congestive heart failure) ([place])    a. 03/2016 Echo: EF 55-60%, no rwma, Gr2 DD.  . Diabetes mellitus   . Dyspnea   . Gross hematuria   . H/O mechanical aortic valve replacement    a. 03/2016 Echo: AoV mean gradient 66mmHg, Valve Area (VTI) 1.36 cm^2, (Vmax) 1.22 cm^2, mod dil LA.  [name] Hyperlipidemia   . Hypertension   . NEPHROLITHIASIS, HX OF   . Obesity   . PAF (paroxysmal atrial fibrillation) ([place])    a. H/o such, with recurrence of coarse afib/flutter during ER consult 03/2012.  [name] TRANSIENT ISCHEMIC ATTACK, HX OF    a. In 2004.  [name] Unspecified vitamin D deficiency     Patient Active Problem List   Diagnosis Date Noted  . Anemia 08/31/2017  . Acute on chronic diastolic CHF (congestive heart failure) ([place]) 03/30/2016  . Encounter for well adult exam with abnormal findings 02/25/2016  . Screen for colon cancer 09/03/2015  . PTSD (post-traumatic stress disorder) 10/31/2013  . Depressive disorder 10/31/2013  . Hallucinations 10/31/2013  . Cholelithiasis 08/22/2013  . Hypothyroidism 08/22/2013  . Encounter for therapeutic drug monitoring 08/07/2013  . Unstable angina ([place]) 07/25/2013  . Moderate to severe aortic stenosis- pt will need AVR revision 07/24/2013  . CAD of artery bypass graft- SVG-RCA BMS 2/271/5 07/24/2013  . Hematoma  of groin 07/24/2013  . Retroperitoneal bleed Nov 2014 07/24/2013  . Acute diastolic heart failure ([place]) 07/21/2013  . Ascending aortic dissection  in 2000- s/p [place] and MDT tilting disc AVR   . PAF (paroxysmal atrial fibrillation) ([place])   . Bladder neck obstruction 05/04/2012  . Unspecified vitamin D deficiency 08/07/2008  . Gross hematuria 08/07/2008  . FATIGUE 12/26/2007  . History of cardiovascular disorder 12/26/2007  . Hyperlipidemia 08/20/2007  . Diabetes ([place]) 05/21/2007  . Essential hypertension 05/21/2007  . NEPHROLITHIASIS, HX OF 05/21/2007    Past Surgical History:  Procedure  Laterality Date  . AORTIC VALVE REPLACEMENT  2000  . APPENDECTOMY  1998  . CORONARY ARTERY BYPASS GRAFT  2000  . LEFT AND RIGHT HEART CATHETERIZATION WITH CORONARY ANGIOGRAM N/A 07/19/2013   Procedure: LEFT AND RIGHT HEART CATHETERIZATION WITH CORONARY ANGIOGRAM;  Surgeon: [name], MD;  Location: [facility] CATH LAB;  Service: Cardiovascular;  Laterality: N/A;  . TEE WITHOUT CARDIOVERSION N/A 07/22/2013   Procedure: TRANSESOPHAGEAL ECHOCARDIOGRAM (TEE);  Surgeon: [name], MD;  Location: [facility] ENDOSCOPY;  Service: Cardiovascular;  Laterality: N/A;        Home Medications    Prior to Admission medications   Medication Sig Start Date End Date Taking? Authorizing Provider  cholecalciferol (VITAMIN D) 1000 UNITS tablet Take 1,000 Units by mouth daily.   Yes [provider]  cloNIDine (CATAPRES) 0.1 MG tablet TAKE 1 TABLET TWICE DAILY Patient taking differently: TAKE 1 TABLET BY MOUTH DAILY 03/14/17  Yes Biagio Borg, MD  furosemide (LASIX) 40 MG tablet Take 1 tablet (40 mg total) by mouth 2 (two) times daily. Patient taking differently: Take 40 mg by mouth See admin instructions. Take one tablet (40 mg) by mouth twice daily - morning and 5pm 08/22/30  Yes Delora Fuel, MD  glimepiride (AMARYL) 1 MG tablet TAKE 3 TABLETS ONE TIME DAILY WITH BREAKFAST Patient taking differently: TAKE 1 TABLET (1 MG) BY MOUTH DAILY 03/14/17  Yes Biagio Borg, MD  levothyroxine (SYNTHROID, LEVOTHROID) 50 MCG tablet TAKE 1 TABLET EVERY DAY 03/14/17  Yes Biagio Borg, MD  losartan (COZAAR) 100 MG tablet TAKE 1 TABLET EVERY DAY 03/14/17  Yes Biagio Borg, MD  metFORMIN (GLUCOPHAGE) 1000 MG tablet TAKE 1 TABLET TWICE DAILY WITH MEALS 08/23/17  Yes Biagio Borg, MD  nitroGLYCERIN (NITROSTAT) 0.4 MG SL tablet Place 1 tablet (0.4 mg total) under the tongue every 5 (five) minutes x 3 doses as needed for chest pain. 07/25/13  Yes Kilroy, Luke K, PA-C  Potassium Chloride ER 20 MEQ TBCR TAKE 2 TABLETS EVERY  DAY Patient taking differently: TAKE 1 TABLET BY MOUTH TWICE DAILY - AM AND 5PM 12/07/16  Yes Biagio Borg, MD  pravastatin (PRAVACHOL) 40 MG tablet TAKE 1 TABLET EVERY DAY 03/14/17  Yes Biagio Borg, MD  warfarin (COUMADIN) 5 MG tablet TAKE AS DIRECTED BY COUMADIN CLINIC Patient taking differently: TAKE 1 1/2 TABLET (7.5 MG) BY MOUTH ON MON/WED/FRI EVENING, TAKE 1 TABLET (5 MG) ON SUN/TUE/THU/SAT EVENING 08/23/17  Yes Deboraha Sprang, MD  metoprolol (TOPROL-XL) 200 MG 24 hr tablet Take 1 tablet (200 mg total) by mouth daily. Patient not taking: Reported on 10/21/2017 12/08/15   Renato Shin, MD  sitaGLIPtin (JANUVIA) 100 MG tablet Take 1 tablet (100 mg total) by mouth daily. Patient not taking: Reported on 10/21/2017 12/09/15   Renato Shin, MD  Vitamin D, Ergocalciferol, (DRISDOL) 50000 units CAPS capsule Take 1 capsule (50,000 Units total)  by mouth every 7 (seven) days. Patient not taking: Reported on 10/21/2017 08/31/17   Biagio Borg, MD    Family History Family History  Problem Relation Age of Onset  . Coronary artery disease Mother   . Hypertension Mother   . Diabetes Brother   . Coronary artery disease Other 8       male 1st degree relative, CABG in his 19's (father)    Social History Social History   Tobacco Use  . Smoking status: Never Smoker  . Smokeless tobacco: Never Used  Substance Use Topics  . Alcohol use: No    Alcohol/week: 0.0 oz  . Drug use: No     Allergies   Insulin glargine   Review of Systems Review of Systems  Constitutional: Negative for chills and fever.  HENT: Negative for congestion and facial swelling.   Eyes: Negative for discharge and visual disturbance.  Respiratory: Positive for shortness of breath.   Cardiovascular: Negative for chest pain and palpitations.  Gastrointestinal: Negative for abdominal pain, diarrhea and vomiting.  Musculoskeletal: Negative for arthralgias and myalgias.  Skin: Negative for color change and rash.   Neurological: Negative for tremors, syncope and headaches.  Psychiatric/Behavioral: Negative for confusion and dysphoric mood.     Physical Exam Updated Vital Signs BP (!) 124/101   Pulse 95   Temp 98.6 F (37 C) (Oral)   Resp (!) 26   SpO2 100%   Physical Exam  Constitutional: He is oriented to person, place, and time. He appears well-developed and well-nourished.  HENT:  Head: Normocephalic and atraumatic.  Eyes: Pupils are equal, round, and reactive to light. EOM are normal.  Neck: Normal range of motion. Neck supple. JVD ( Just above the clavicle) present.  Cardiovascular: Normal rate and regular rhythm. Exam reveals no gallop and no friction rub.  No murmur heard. Pulmonary/Chest: No respiratory distress. He has no wheezes. He has rales (faint, bases).  Abdominal: He exhibits no distension. There is no rebound and no guarding.  Musculoskeletal: Normal range of motion. He exhibits edema (trace bilaterally).  Neurological: He is alert and oriented to person, place, and time.  Skin: No rash noted. No pallor.  Psychiatric: He has a normal mood and affect. His behavior is normal.  Nursing note and vitals reviewed.    ED Treatments / Results  Labs (all labs ordered are listed, but only abnormal results are displayed) Labs Reviewed  BASIC METABOLIC PANEL - Abnormal; Notable for the following components:      Result Value   Potassium 3.4 (*)    Glucose, Bld 131 (*)    All other components within normal limits  CBC - Abnormal; Notable for the following components:   HCT 38.6 (*)    All other components within normal limits  PROTIME-INR - Abnormal; Notable for the following components:   Prothrombin Time 20.7 (*)    All other components within normal limits  BRAIN NATRIURETIC PEPTIDE - Abnormal; Notable for the following components:   B Natriuretic Peptide 134.6 (*)    All other components within normal limits  I-STAT TROPONIN, ED    EKG EKG  Interpretation  Date/Time:  Saturday October 21 2017 15:12:24 EDT Ventricular Rate:  92 PR Interval:    QRS Duration: 96 QT Interval:  390 QTC Calculation: 482 R Axis:   7 Text Interpretation:  afib vs nsr with irregularity Prolonged QT Abnormal ECG Otherwise no significant change Confirmed by Deno Etienne [phone]) on 10/21/2017 6:58:12 PM   Radiology  Dg Chest 2 View  Result Date: 10/21/2017 CLINICAL DATA:  Shortness of Breath EXAM: CHEST - 2 VIEW COMPARISON:  08/26/2017 FINDINGS: Cardiac shadow is mildly enlarged but stable postsurgical changes are again seen. The lungs are well aerated bilaterally. Mild vascular congestion is again seen without focal interstitial edema or sizable effusion. No focal infiltrate is noted. No bony abnormality is seen. IMPRESSION: Mild vascular congestion stable in appearance from the prior exam. No acute abnormality is noted. Electronically Signed   By: Inez Catalina M.D.   On: 10/21/2017 16:08    Procedures Procedures (including critical care time)  Medications Ordered in ED Medications  potassium chloride SA (K-DUR,KLOR-CON) CR tablet 40 mEq (40 mEq Oral Given 10/21/17 1948)  furosemide (LASIX) injection 40 mg (40 mg Intravenous Given 10/21/17 1948)  magnesium oxide (MAG-OX) tablet 800 mg (800 mg Oral Given 10/21/17 1948)     Initial Impression / Assessment and Plan / ED Course  I have reviewed the triage vital signs and the nursing notes.  Pertinent labs & imaging results that were available during my care of the patient were reviewed by me and considered in my medical decision making (see chart for details).     51 yo M with a chief complaint of shortness of breath on exertion.  This been going on for the past month.  Is been worsening.  He was seen in the ED about a month ago and was diagnosed with a CHF exacerbation.  Since then the patient has increased his diuretics at home and feels like he is actually lost a significant amount of fluid.  However shortness  of breath is not improved.  Worse when he lays back flat improves when he sits up.  Has had some nausea and vomiting denies chest pain or pressure.  Has a history of a stent placed in 2015.  At that time he had no chest pain or pressure.  He thinks this feels the same.  His troponin is negative his EKG is non-concerning.  Will discuss with cardiology.   CRITICAL CARE Performed by: Cecilio Asper   Total critical care time: 35 minutes  Critical care time was exclusive of separately billable procedures and treating other patients.  Critical care was necessary to treat or prevent imminent or life-threatening deterioration.  Critical care was time spent personally by me on the following activities: development of treatment plan with patient and/or surrogate as well as nursing, discussions with consultants, evaluation of patient's response to treatment, examination of patient, obtaining history from patient or surrogate, ordering and performing treatments and interventions, ordering and review of laboratory studies, ordering and review of radiographic studies, pulse oximetry and re-evaluation of patient's condition.  Cards eval, will admit.   The patients results and plan were reviewed and discussed.   Any x-rays performed were independently reviewed by myself.   Differential diagnosis were considered with the presenting HPI.  Medications  potassium chloride SA (K-DUR,KLOR-CON) CR tablet 40 mEq (40 mEq Oral Given 10/21/17 1948)  furosemide (LASIX) injection 40 mg (40 mg Intravenous Given 10/21/17 1948)  magnesium oxide (MAG-OX) tablet 800 mg (800 mg Oral Given 10/21/17 1948)    Vitals:   10/21/17 1930 10/21/17 2030 10/21/17 2130 10/21/17 2300  BP: (!) 125/104 (!) 132/104 (!) 129/95 (!) 124/101  Pulse: 89 90 92 95  Resp: 16 20 19  (!) 26  Temp:      TempSrc:      SpO2: 98% 96% 94% 100%    Final diagnoses:  SOB (shortness of breath) on exertion  Acute on chronic systolic congestive  heart failure ([place])    Admission/ observation were discussed with the admitting physician, patient and/or family and they are comfortable with the plan.    Final Clinical Impressions(s) / ED Diagnoses   Final diagnoses:  SOB (shortness of breath) on exertion  Acute on chronic systolic congestive heart failure [facility])    ED Discharge Orders    None       Deno Etienne, DO 10/21/17 2346    Deno Etienne, DO 10/30/17 1958

## 2017-10-21 NOTE — ED Triage Notes (Signed)
Patient to ED c/o SOB x 1 month, worsening the past couple days. Pt states a couple years ago he came for the same thing and he ended up having a blockage and needed a stent. Patient states SOB worse with exertion. Resp e/u, skin warm/dry. Patient denies CP, no dizziness/lightheadedness.

## 2017-10-22 ENCOUNTER — Other Ambulatory Visit: Payer: Self-pay

## 2017-10-22 DIAGNOSIS — R0602 Shortness of breath: Secondary | ICD-10-CM | POA: Diagnosis not present

## 2017-10-22 DIAGNOSIS — R0609 Other forms of dyspnea: Secondary | ICD-10-CM

## 2017-10-22 LAB — CBC
HCT: 38.1 % — ABNORMAL LOW (ref 39.0–52.0)
HEMATOCRIT: 39.5 % (ref 39.0–52.0)
HEMOGLOBIN: 13.6 g/dL (ref 13.0–17.0)
Hemoglobin: 12.9 g/dL — ABNORMAL LOW (ref 13.0–17.0)
MCH: 26.8 pg (ref 26.0–34.0)
MCH: 27.3 pg (ref 26.0–34.0)
MCHC: 33.9 g/dL (ref 30.0–36.0)
MCHC: 34.4 g/dL (ref 30.0–36.0)
MCV: 79.2 fL (ref 78.0–100.0)
MCV: 79.3 fL (ref 78.0–100.0)
PLATELETS: 179 10*3/uL (ref 150–400)
Platelets: 186 10*3/uL (ref 150–400)
RBC: 4.81 MIL/uL (ref 4.22–5.81)
RBC: 4.98 MIL/uL (ref 4.22–5.81)
RDW: 14.8 % (ref 11.5–15.5)
RDW: 14.9 % (ref 11.5–15.5)
WBC: 7.1 10*3/uL (ref 4.0–10.5)
WBC: 6.3 10*3/uL (ref 4.0–10.5)

## 2017-10-22 LAB — HEMOGLOBIN A1C
Hgb A1c MFr Bld: 8.2 % — ABNORMAL HIGH (ref 4.8–5.6)
Mean Plasma Glucose: 188.64 mg/dL

## 2017-10-22 LAB — PROTIME-INR
INR: 1.74
Prothrombin Time: 20.2 seconds — ABNORMAL HIGH (ref 11.4–15.2)

## 2017-10-22 LAB — CBG MONITORING, ED
Glucose-Capillary: 140 mg/dL — ABNORMAL HIGH (ref 65–99)
Glucose-Capillary: 125 mg/dL — ABNORMAL HIGH (ref 65–99)

## 2017-10-22 LAB — TROPONIN I
Troponin I: <0.03 ng/mL (ref <0.03)
Troponin I: <0.03 ng/mL (ref <0.03)

## 2017-10-22 LAB — LIPID PANEL
Cholesterol: 140 mg/dL (ref 0–200)
Cholesterol: 139 mg/dL (ref 0–200)
HDL: 30 mg/dL — ABNORMAL LOW (ref >40)
HDL: 33 mg/dL — ABNORMAL LOW (ref >40)
LDL Cholesterol: 87 mg/dL (ref 0–99)
LDL Cholesterol: 87 mg/dL (ref 0–99)
Total CHOL/HDL Ratio: 4.7 RATIO
Total CHOL/HDL Ratio: 4.2 RATIO
Triglycerides: 117 mg/dL (ref <150)
Triglycerides: 95 mg/dL (ref <150)
VLDL: 23 mg/dL (ref 0–40)
VLDL: 19 mg/dL (ref 0–40)

## 2017-10-22 LAB — GLUCOSE, CAPILLARY
GLUCOSE-CAPILLARY: 142 mg/dL — AB (ref 65–99)
GLUCOSE-CAPILLARY: 176 mg/dL — AB (ref 65–99)

## 2017-10-22 LAB — CREATININE, SERUM
Creatinine, Ser: 1.15 mg/dL (ref 0.61–1.24)
GFR calc Af Amer: >60 mL/min (ref >60)
GFR calc non Af Amer: >60 mL/min (ref >60)

## 2017-10-22 LAB — HEPARIN LEVEL (UNFRACTIONATED): Heparin Unfractionated: 0.62 IU/mL (ref 0.30–0.70)

## 2017-10-22 MED ORDER — NITROGLYCERIN 0.4 MG SL SUBL: 0.4000 mg | SUBLINGUAL_TABLET | SUBLINGUAL | Status: DC | PRN

## 2017-10-22 MED ORDER — WARFARIN - PHYSICIAN DOSING INPATIENT
Freq: Every day | Status: DC
  Administered 2017-10-22 – 2017-10-24 (×2)
  Administered 2017-10-25: 1

## 2017-10-22 MED ORDER — ASPIRIN 325 MG PO TABS
325.0000 mg | ORAL_TABLET | Freq: Once | ORAL | Status: AC
  Administered 2017-10-22: 325 mg via ORAL
  Filled 2017-10-22: qty 1

## 2017-10-22 MED ORDER — ASPIRIN 325 MG PO TABS: 325.0000 mg | ORAL_TABLET | Freq: Every day | ORAL | Status: DC

## 2017-10-22 MED ORDER — FUROSEMIDE 40 MG PO TABS
40.0000 mg | ORAL_TABLET | ORAL | Status: DC
  Administered 2017-10-23: 40 mg via ORAL
  Filled 2017-10-22: qty 1

## 2017-10-22 MED ORDER — ONDANSETRON HCL 4 MG/2ML IJ SOLN: 4.0000 mg | Freq: Four times a day (QID) | INTRAMUSCULAR | Status: DC | PRN

## 2017-10-22 MED ORDER — WARFARIN SODIUM 5 MG PO TABS
5.0000 mg | ORAL_TABLET | ORAL | Status: DC
  Administered 2017-10-22 – 2017-10-24 (×3): 5 mg via ORAL
  Filled 2017-10-22 (×4): qty 1

## 2017-10-22 MED ORDER — HEPARIN SODIUM (PORCINE) 5000 UNIT/ML IJ SOLN: 5000.0000 [IU] | Freq: Three times a day (TID) | INTRAMUSCULAR | Status: DC

## 2017-10-22 MED ORDER — HEPARIN (PORCINE) IN NACL 100-0.45 UNIT/ML-% IJ SOLN
1700.0000 [IU]/h | INTRAMUSCULAR | Status: DC
  Administered 2017-10-22 – 2017-10-23 (×3): 1800 [IU]/h via INTRAVENOUS
  Filled 2017-10-22 (×4): qty 250

## 2017-10-22 MED ORDER — LOSARTAN POTASSIUM 50 MG PO TABS
100.0000 mg | ORAL_TABLET | Freq: Every day | ORAL | Status: DC
  Administered 2017-10-23 – 2017-10-26 (×4): 100 mg via ORAL
  Filled 2017-10-22 (×4): qty 2

## 2017-10-22 MED ORDER — ASPIRIN 81 MG PO CHEW
81.0000 mg | CHEWABLE_TABLET | Freq: Every day | ORAL | Status: DC
  Administered 2017-10-23: 81 mg via ORAL
  Filled 2017-10-22: qty 1

## 2017-10-22 MED ORDER — WARFARIN SODIUM 7.5 MG PO TABS
7.5000 mg | ORAL_TABLET | ORAL | Status: DC
  Administered 2017-10-23 – 2017-10-25 (×2): 7.5 mg via ORAL
  Filled 2017-10-22 (×2): qty 1

## 2017-10-22 MED ORDER — VITAMIN D 1000 UNITS PO TABS
1000.0000 [IU] | ORAL_TABLET | Freq: Every day | ORAL | Status: DC
  Administered 2017-10-23 – 2017-10-26 (×4): 1000 [IU] via ORAL
  Filled 2017-10-22 (×4): qty 1

## 2017-10-22 MED ORDER — CLONIDINE HCL 0.1 MG PO TABS
0.1000 mg | ORAL_TABLET | Freq: Every day | ORAL | Status: DC
  Administered 2017-10-23 – 2017-10-25 (×3): 0.1 mg via ORAL
  Filled 2017-10-22 (×3): qty 1

## 2017-10-22 MED ORDER — LEVOTHYROXINE SODIUM 50 MCG PO TABS
50.0000 ug | ORAL_TABLET | Freq: Every day | ORAL | Status: DC
  Administered 2017-10-23 – 2017-10-26 (×4): 50 ug via ORAL
  Filled 2017-10-22 (×5): qty 1

## 2017-10-22 MED ORDER — ACETAMINOPHEN 325 MG PO TABS: 650.0000 mg | ORAL_TABLET | ORAL | Status: DC | PRN

## 2017-10-22 MED ORDER — PRAVASTATIN SODIUM 40 MG PO TABS
40.0000 mg | ORAL_TABLET | Freq: Every day | ORAL | Status: DC
  Filled 2017-10-22: qty 1

## 2017-10-22 NOTE — ED Notes (Signed)
Pt is NPO due to procedure (TEE) for further evaluation per admitting MD.

## 2017-10-22 NOTE — ED Notes (Signed)
Paged MD in regards to ECHO and pt NPO status.

## 2017-10-22 NOTE — H&P (Signed)
History & Physical    Patient ID: [name] MRN: [id number], DOB/AGE: 07-17-1965   Admit date: 10/21/2017   Primary Physician: Biagio Borg, MD Primary Cardiologist: No primary care provider on file.  Patient Profile    [name] is a 52 year old gentleman with CAD s/p PCI in 2015, aortic dissection s/p bentall with mechanical AVR, Atrial fibrillation, DM, HTN, HL who presents due to shortness of breath.   Past Medical History    Past Medical History:  Diagnosis Date  . Ascending aortic dissection ([place])    a. 2000 - with aortic valve involvement. S/p repair of aortic dissection with placement of mechanical AVR.  [name] CAD (coronary artery disease)    a. 2000 VG->PDA @ time of Ao dissection repair;  b. Cardiac CT 06/2010: no CAD, only mild plaque in prox RCA;  c. 2015 Cath: LM nl, LAD nl, LCX nl, RCA 100, VG->PDA 90 (4.5x12 Rebel BMS).  . Cholelithiasis 08/22/2013  . Chronic diastolic CHF (congestive heart failure) ([place])    a. 03/2016 Echo: EF 55-60%, no rwma, Gr2 DD.  . Diabetes mellitus   . Dyspnea   . Gross hematuria   . H/O mechanical aortic valve replacement    a. 03/2016 Echo: AoV mean gradient 51mmHg, Valve Area (VTI) 1.36 cm^2, (Vmax) 1.22 cm^2, mod dil LA.  [name] Hyperlipidemia   . Hypertension   . NEPHROLITHIASIS, HX OF   . Obesity   . PAF (paroxysmal atrial fibrillation) ([place])    a. H/o such, with recurrence of coarse afib/flutter during ER consult 03/2012.  [name] TRANSIENT ISCHEMIC ATTACK, HX OF    a. In 2004.  [name] Unspecified vitamin D deficiency     Past Surgical History:  Procedure Laterality Date  . AORTIC VALVE REPLACEMENT  2000  . APPENDECTOMY  1998  . CORONARY ARTERY BYPASS GRAFT  2000  . LEFT AND RIGHT HEART CATHETERIZATION WITH CORONARY ANGIOGRAM N/A 07/19/2013   Procedure: LEFT AND RIGHT HEART CATHETERIZATION WITH CORONARY ANGIOGRAM;  Surgeon: [name], MD;  Location: [facility] CATH LAB;  Service: Cardiovascular;  Laterality: N/A;  . TEE WITHOUT  CARDIOVERSION N/A 07/22/2013   Procedure: TRANSESOPHAGEAL ECHOCARDIOGRAM (TEE);  Surgeon: [name], MD;  Location: [facility] ENDOSCOPY;  Service: Cardiovascular;  Laterality: N/A;     Allergies  Allergies  Allergen Reactions  . Insulin Glargine Swelling    edema    History of Present Illness    [name] is a 52 year old gentleman with CAD s/p PCI in 2015, aortic dissection s/p bentall with mechanical AVR, Atrial fibrillation, DM, HTN, HL who presents due to shortness of breath.   The patient reports that he presented to the ED approximately one month ago due to shortness of breath. At that time, he was felt to have CHF exacerbation and was given a dose of IV lasix in the ED with improvement and was discharged home. He reports that he initially did better but in past week has had significant shortness of breath which limits exertion. Notes that he usually goes to the gym on a regular basis but on the day of presentation was unable to even take the trash cans down the hill of his driveway without having to stop and rest. He reports that symptoms were particularly bad today but that he has felt weak and dyspneic for past week. He denies chest pain, orthopnea, PND, LEE. Does note similar symptoms prior to PCI in 2015.  Given symptoms, he presented to the ED  tonight. In the ED, He was hemodynamically stable without acute ECG changes. Initial troponin zero, labs significant only for hypokalemia. At the time of my evaluation he reports feeling comfortable without CP or SOB.  Home Medications    Prior to Admission medications   Medication Sig Start Date End Date Taking? Authorizing Provider  cholecalciferol (VITAMIN D) 1000 UNITS tablet Take 1,000 Units by mouth daily.   Yes [provider]  cloNIDine (CATAPRES) 0.1 MG tablet TAKE 1 TABLET TWICE DAILY Patient taking differently: TAKE 1 TABLET BY MOUTH DAILY 03/14/17  Yes Biagio Borg, MD  furosemide (LASIX) 40 MG tablet Take 1 tablet  (40 mg total) by mouth 2 (two) times daily. Patient taking differently: Take 40 mg by mouth See admin instructions. Take one tablet (40 mg) by mouth twice daily - morning and 5pm 08/29/65  Yes Delora Fuel, MD  glimepiride (AMARYL) 1 MG tablet TAKE 3 TABLETS ONE TIME DAILY WITH BREAKFAST Patient taking differently: TAKE 1 TABLET (1 MG) BY MOUTH DAILY 03/14/17  Yes Biagio Borg, MD  levothyroxine (SYNTHROID, LEVOTHROID) 50 MCG tablet TAKE 1 TABLET EVERY DAY 03/14/17  Yes Biagio Borg, MD  losartan (COZAAR) 100 MG tablet TAKE 1 TABLET EVERY DAY 03/14/17  Yes Biagio Borg, MD  metFORMIN (GLUCOPHAGE) 1000 MG tablet TAKE 1 TABLET TWICE DAILY WITH MEALS 08/23/17  Yes Biagio Borg, MD  nitroGLYCERIN (NITROSTAT) 0.4 MG SL tablet Place 1 tablet (0.4 mg total) under the tongue every 5 (five) minutes x 3 doses as needed for chest pain. 07/25/13  Yes Kilroy, Luke K, PA-C  Potassium Chloride ER 20 MEQ TBCR TAKE 2 TABLETS EVERY DAY Patient taking differently: TAKE 1 TABLET BY MOUTH TWICE DAILY - AM AND 5PM 12/07/16  Yes Biagio Borg, MD  pravastatin (PRAVACHOL) 40 MG tablet TAKE 1 TABLET EVERY DAY 03/14/17  Yes Biagio Borg, MD  warfarin (COUMADIN) 5 MG tablet TAKE AS DIRECTED BY COUMADIN CLINIC Patient taking differently: TAKE 1 1/2 TABLET (7.5 MG) BY MOUTH ON MON/WED/FRI EVENING, TAKE 1 TABLET (5 MG) ON SUN/TUE/THU/SAT EVENING 08/23/17  Yes Deboraha Sprang, MD  metoprolol (TOPROL-XL) 200 MG 24 hr tablet Take 1 tablet (200 mg total) by mouth daily. Patient not taking: Reported on 10/21/2017 12/08/15   Renato Shin, MD  sitaGLIPtin (JANUVIA) 100 MG tablet Take 1 tablet (100 mg total) by mouth daily. Patient not taking: Reported on 10/21/2017 12/09/15   Renato Shin, MD  Vitamin D, Ergocalciferol, (DRISDOL) 50000 units CAPS capsule Take 1 capsule (50,000 Units total) by mouth every 7 (seven) days. Patient not taking: Reported on 10/21/2017 08/31/17   Biagio Borg, MD    Family History    Family History  Problem  Relation Age of Onset  . Coronary artery disease Mother   . Hypertension Mother   . Diabetes Brother   . Coronary artery disease Other 14       male 1st degree relative, CABG in his 16's (father)   indicated that the status of his mother is unknown. He indicated that the status of his brother is unknown. He indicated that the status of his other is unknown.   Social History    Social History   Socioeconomic History  . Marital status: Married    Spouse name: Not on file  . Number of children: 4  . Years of education: Not on file  . Highest education level: Not on file  Occupational History  . Occupation: DISABLED  Employer: UNEMPLOYED  Social Needs  . Financial resource strain: Not on file  . Food insecurity:    Worry: Not on file    Inability: Not on file  . Transportation needs:    Medical: Not on file    Non-medical: Not on file  Tobacco Use  . Smoking status: Never Smoker  . Smokeless tobacco: Never Used  Substance and Sexual Activity  . Alcohol use: No    Alcohol/week: 0.0 oz  . Drug use: No  . Sexual activity: Not on file  Lifestyle  . Physical activity:    Days per week: Not on file    Minutes per session: Not on file  . Stress: Not on file  Relationships  . Social connections:    Talks on phone: Not on file    Gets together: Not on file    Attends religious service: Not on file    Active member of club or organization: Not on file    Attends meetings of clubs or organizations: Not on file    Relationship status: Not on file  . Intimate partner violence:    Fear of current or ex partner: Not on file    Emotionally abused: Not on file    Physically abused: Not on file    Forced sexual activity: Not on file  Other Topics Concern  . Not on file  Social History Narrative  . Not on file     Review of Systems    General:  No chills, fever, night sweats or weight changes.  Cardiovascular:  As per HPI Dermatological: No rash,  lesions/masses Respiratory: No cough, dyspnea Urologic: No hematuria, dysuria Abdominal:   No nausea, vomiting, diarrhea, bright red blood per rectum, melena, or hematemesis Neurologic:  No visual changes, wkns, changes in mental status. All other systems reviewed and are otherwise negative except as noted above.  Physical Exam    Blood pressure (!) 124/101, pulse 95, temperature 98.6 F (37 C), temperature source Oral, resp. rate (!) 26, SpO2 100 %.  General: Pleasant, NAD Psych: Normal affect. Neuro: Alert and oriented X 3. Moves all extremities spontaneously. HEENT: Normal  Neck: Supple without bruits or JVD. Lungs:  Resp regular and unlabored, CTA. Heart: Regular rate irregular rhythm. Mechanical S2. No murmurs, rubs or gallops Abdomen: Soft, non-tender, non-distended, BS + x 4.  Extremities: No clubbing, cyanosis or edema. DP/PT/Radials 2+ and equal bilaterally.  Labs    Troponin (Point of Care Test) Recent Labs    10/21/17 1600  TROPIPOC 0.00   No results for input(s): CKTOTAL, CKMB, TROPONINI in the last 72 hours. Lab Results  Component Value Date   WBC 6.4 10/21/2017   HGB 13.3 10/21/2017   HCT 38.6 (L) 10/21/2017   MCV 80.6 10/21/2017   PLT 206 10/21/2017    Recent Labs  Lab 10/21/17 1520  NA 141  K 3.4*  CL 104  CO2 27  BUN 10  CREATININE 1.13  CALCIUM 9.0  GLUCOSE 131*   Lab Results  Component Value Date   CHOL 133 08/31/2017   HDL 31.90 (L) 08/31/2017   LDLCALC 72 08/31/2017   TRIG 146.0 08/31/2017   Lab Results  Component Value Date   DDIMER (H) 05/26/2010    0.72        AT THE INHOUSE ESTABLISHED CUTOFF VALUE OF 0.48 ug/mL FEU, THIS ASSAY HAS BEEN DOCUMENTED IN THE LITERATURE TO HAVE A SENSITIVITY AND NEGATIVE PREDICTIVE VALUE OF AT LEAST 98 TO 99%.  THE  TEST RESULT SHOULD BE CORRELATED WITH AN ASSESSMENT OF THE CLINICAL PROBABILITY OF DVT / VTE.     Radiology Studies    Dg Chest 2 View  Result Date: 10/21/2017 CLINICAL DATA:   Shortness of Breath EXAM: CHEST - 2 VIEW COMPARISON:  08/26/2017 FINDINGS: Cardiac shadow is mildly enlarged but stable postsurgical changes are again seen. The lungs are well aerated bilaterally. Mild vascular congestion is again seen without focal interstitial edema or sizable effusion. No focal infiltrate is noted. No bony abnormality is seen. IMPRESSION: Mild vascular congestion stable in appearance from the prior exam. No acute abnormality is noted. Electronically Signed   By: Inez Catalina M.D.   On: 10/21/2017 16:08    ECG & Cardiac Imaging    ECG 10/21/17: Atrial flutter with variable conduction. Normal rate. No acute ischemic changes.   Assessment & Plan    [name] is a 52 year old gentleman with CAD s/p PCI in 2015, aortic dissection s/p bentall with mechanical AVR, Atrial fibrillation, DM, HTN, HL who presents due to shortness of breath.   # Shortness of breath Concern that recent increase in dyspnea may be anginal equivalent with similar symptoms prior to PCI in 2015. Differential also includes HF although no clear volume overload on examination and weight decreasing ( 284 --> 278). Unlikely to be acute PE given therapeutic anticoagulation previously (although INR 1.8 today).  - ASA 324mg  x1 then 81 mg daily; would not d/c with aspirin if ischemic etiology of symptoms is not identified given AC with warfarin - Cont home BP meds - Cont home pravastatin - could likely benefit from higher intensity statin  - SLNG prn - TTE for evaluation of function - Likely plan for echo stress test to evaluate function and potential ischemia - A1C, Lipid panel for risk evaluation  # Mechanical AVR Echo in 2017 with mean gradient 19 mm Hg. Valve area (VTI): 1.36 cm^2. Valve area (Vmax):   1.22 cm^2. Valve area (Vmean): 1.19 cm^2. Potential for change in gradient to contribute to symptoms but no murmur on examination - cont home warfarin; pharmacy consult given subtherapeutic INR today - Heparin  bridge given INR 1.8 - TTE as above   # Atrial fibrillation/flutter Rate controlled on admission - Anticoagulation as above  # HTN - Cont home clonidine, losartan  # DM - A1C as above - Will not order SSI given insulin glargine allergy - accuchecks - Hold home DM medications  # Hypothyroid - Cont home synthroid  Signed, Bryna Colander, MD 10/22/2017, 12:17 AM

## 2017-10-22 NOTE — Progress Notes (Signed)
ANTICOAGULATION CONSULT NOTE - Initial Consult  Pharmacy Consult for heparin Indication: Afib and AVR  Allergies  Allergen Reactions  . Insulin Glargine Swelling    edema    Patient Measurements: Height: 6\' 2"  (188 cm) Weight: 299 lb (135.6 kg) IBW/kg (Calculated) : 82.2 Heparin Dosing Weight: 115kg  Vital Signs: Temp: 98.6 F (37 C) (06/01 1516) Temp Source: Oral (06/01 1516) BP: 124/101 (06/01 2300) Pulse Rate: 95 (06/01 2300)  Labs: Recent Labs    10/21/17 1520 10/21/17 1935  HGB 13.3  --   HCT 38.6*  --   PLT 206  --   LABPROT  --  20.7*  INR  --  1.80  CREATININE 1.13  --     Estimated Creatinine Clearance: 112.1 mL/min (by C-G formula based on SCr of 1.13 mg/dL).   Medical History: Past Medical History:  Diagnosis Date  . Ascending aortic dissection ([place])    a. 2000 - with aortic valve involvement. S/p repair of aortic dissection with placement of mechanical AVR.  [name] CAD (coronary artery disease)    a. 2000 VG->PDA @ time of Ao dissection repair;  b. Cardiac CT 06/2010: no CAD, only mild plaque in prox RCA;  c. 2015 Cath: LM nl, LAD nl, LCX nl, RCA 100, VG->PDA 90 (4.5x12 Rebel BMS).  . Cholelithiasis 08/22/2013  . Chronic diastolic CHF (congestive heart failure) ([place])    a. 03/2016 Echo: EF 55-60%, no rwma, Gr2 DD.  . Diabetes mellitus   . Dyspnea   . Gross hematuria   . H/O mechanical aortic valve replacement    a. 03/2016 Echo: AoV mean gradient 77mmHg, Valve Area (VTI) 1.36 cm^2, (Vmax) 1.22 cm^2, mod dil LA.  [name] Hyperlipidemia   . Hypertension   . NEPHROLITHIASIS, HX OF   . Obesity   . PAF (paroxysmal atrial fibrillation) ([place])    a. H/o such, with recurrence of coarse afib/flutter during ER consult 03/2012.  [name] TRANSIENT ISCHEMIC ATTACK, HX OF    a. In 2004.  [name] Unspecified vitamin D deficiency     [name], admitted for further cardiac w/u, [name], to begin heparin bridge.  Goal of Therapy:  Heparin level 0.3-0.7 units/ml Monitor platelets by anticoagulation protocol: Yes   Plan:  Will begin heparin gtt at 1800 units/hr and monitor heparin levels and CBC.  Wynona Neat, PharmD, BCPS  10/22/2017,12:45 AM

## 2017-10-22 NOTE — Progress Notes (Signed)
ANTICOAGULATION CONSULT NOTE - Initial Consult  Pharmacy Consult for heparin Indication: Afib and AVR  Allergies  Allergen Reactions  . Insulin Glargine Swelling    edema    Patient Measurements: Height: 6\' 2"  (188 cm) Weight: 299 lb (135.6 kg) IBW/kg (Calculated) : 82.2 Heparin Dosing Weight: 115kg  Vital Signs: BP: 101/74 (06/02 0900) Pulse Rate: 92 (06/02 0900)  Labs: Recent Labs    10/21/17 1520 10/21/17 1935 10/22/17 0219 10/22/17 0947  HGB 13.3  --  12.9* 13.6  HCT 38.6*  --  38.1* 39.5  PLT 206  --  186 179  LABPROT  --  20.7*  --  20.2*  INR  --  1.80  --  1.74  HEPARINUNFRC  --   --   --  0.62  CREATININE 1.13  --  1.15  --   TROPONINI  --   --  <0.03 <0.03    Estimated Creatinine Clearance: 110.1 mL/min (by C-G formula based on SCr of 1.15 mg/dL).   Medical History: Past Medical History:  Diagnosis Date  . Ascending aortic dissection ([place])    a. 2000 - with aortic valve involvement. S/p repair of aortic dissection with placement of mechanical AVR.  [name] CAD (coronary artery disease)    a. 2000 VG->PDA @ time of Ao dissection repair;  b. Cardiac CT 06/2010: no CAD, only mild plaque in prox RCA;  c. 2015 Cath: LM nl, LAD nl, LCX nl, RCA 100, VG->PDA 90 (4.5x12 Rebel BMS).  . Cholelithiasis 08/22/2013  . Chronic diastolic CHF (congestive heart failure) ([place])    a. 03/2016 Echo: EF 55-60%, no rwma, Gr2 DD.  . Diabetes mellitus   . Dyspnea   . Gross hematuria   . H/O mechanical aortic valve replacement    a. 03/2016 Echo: AoV mean gradient 34mmHg, Valve Area (VTI) 1.36 cm^2, (Vmax) 1.22 cm^2, mod dil LA.  [name] Hyperlipidemia   . Hypertension   . NEPHROLITHIASIS, HX OF   . Obesity   . PAF (paroxysmal atrial fibrillation) ([place])    a. H/o such, with recurrence of coarse afib/flutter during ER consult 03/2012.  [name] TRANSIENT ISCHEMIC ATTACK, HX OF    a. In 2004.  [name] Unspecified vitamin D deficiency     [name], on Coumadin 5mg  daily exc  for 7.5mg  on MWF PTA for Afib. Subtherapeutic INR at 1.8 (goal 2-3 per [facility] clinic notes) w/ known Afib and h/o AVR, on heparin bridge. Last heparin level is therapeutic at 0.62. Hgb 13.6, plts wnl  Goal of Therapy:  Heparin level 0.3-0.7 units/ml Monitor platelets by anticoagulation protocol: Yes   Plan:  Continue heparin gtt at 1,800 units/hr Monitor daily heparin level, CBC, s/s of bleed  MD dosing Coumadin  Elenor Quinones, PharmD, BCPS Clinical Pharmacist 10/22/2017 12:04 PM

## 2017-10-22 NOTE — Progress Notes (Signed)
New pt admission from ED. Pt brought to the floor in stable condition. Vitals taken. Initial Assessment done. All immediate pertinent needs to patient addressed. Patient Guide given to patient. Important safety instructions relating to hospitalization reviewed with patient. Patient verbalized understanding. Will continue to monitor pt.  Muaz Shorey, RN 

## 2017-10-23 ENCOUNTER — Observation Stay (HOSPITAL_COMMUNITY): Payer: Medicare HMO

## 2017-10-23 ENCOUNTER — Inpatient Hospital Stay (HOSPITAL_COMMUNITY)
Admission: EM | Disposition: A | Discharge status: Home / Self Care | Payer: Self-pay | Source: Home / Self Care | Attending: Cardiology

## 2017-10-23 DIAGNOSIS — Z955 Presence of coronary angioplasty implant and graft: Secondary | ICD-10-CM | POA: Diagnosis not present

## 2017-10-23 DIAGNOSIS — I5031 Acute diastolic (congestive) heart failure: Secondary | ICD-10-CM | POA: Diagnosis not present

## 2017-10-23 DIAGNOSIS — Z951 Presence of aortocoronary bypass graft: Secondary | ICD-10-CM

## 2017-10-23 DIAGNOSIS — Z952 Presence of prosthetic heart valve: Secondary | ICD-10-CM

## 2017-10-23 DIAGNOSIS — R0609 Other forms of dyspnea: Secondary | ICD-10-CM | POA: Diagnosis not present

## 2017-10-23 DIAGNOSIS — E7800 Pure hypercholesterolemia, unspecified: Secondary | ICD-10-CM

## 2017-10-23 DIAGNOSIS — I5023 Acute on chronic systolic (congestive) heart failure: Secondary | ICD-10-CM | POA: Diagnosis not present

## 2017-10-23 DIAGNOSIS — Z7901 Long term (current) use of anticoagulants: Secondary | ICD-10-CM | POA: Diagnosis not present

## 2017-10-23 DIAGNOSIS — I10 Essential (primary) hypertension: Secondary | ICD-10-CM | POA: Diagnosis not present

## 2017-10-23 DIAGNOSIS — I200 Unstable angina: Secondary | ICD-10-CM

## 2017-10-23 DIAGNOSIS — I25810 Atherosclerosis of coronary artery bypass graft(s) without angina pectoris: Secondary | ICD-10-CM | POA: Diagnosis not present

## 2017-10-23 DIAGNOSIS — I482 Chronic atrial fibrillation: Secondary | ICD-10-CM

## 2017-10-23 HISTORY — PX: RIGHT HEART CATH AND CORONARY/GRAFT ANGIOGRAPHY: CATH118265

## 2017-10-23 HISTORY — PX: CORONARY STENT INTERVENTION: CATH118234

## 2017-10-23 LAB — POCT I-STAT 3, ART BLOOD GAS (G3+)
ACID-BASE DEFICIT: 1 mmol/L (ref 0.0–2.0)
BICARBONATE: 23.6 mmol/L (ref 20.0–28.0)
O2 SAT: 93 %
PCO2 ART: 37.8 mmHg (ref 32.0–48.0)
PO2 ART: 65 mmHg — AB (ref 83.0–108.0)
TCO2: 25 mmol/L (ref 22–32)
pH, Arterial: 7.403 (ref 7.350–7.450)

## 2017-10-23 LAB — GLUCOSE, CAPILLARY
GLUCOSE-CAPILLARY: 147 mg/dL — AB (ref 65–99)
GLUCOSE-CAPILLARY: 108 mg/dL — AB (ref 65–99)
Glucose-Capillary: 162 mg/dL — ABNORMAL HIGH (ref 65–99)

## 2017-10-23 LAB — POCT I-STAT 3, VENOUS BLOOD GAS (G3P V)
BICARBONATE: 25.3 mmol/L (ref 20.0–28.0)
Bicarbonate: 25.3 mmol/L (ref 20.0–28.0)
O2 SAT: 55 %
O2 SAT: 63 %
PCO2 VEN: 43.2 mmHg — AB (ref 44.0–60.0)
PH VEN: 7.376 (ref 7.250–7.430)
PO2 VEN: 33 mmHg (ref 32.0–45.0)
TCO2: 27 mmol/L (ref 22–32)
TCO2: 27 mmol/L (ref 22–32)
pCO2, Ven: 42.4 mmHg — ABNORMAL LOW (ref 44.0–60.0)
pH, Ven: 7.384 (ref 7.250–7.430)
pO2, Ven: 30 mmHg — CL (ref 32.0–45.0)

## 2017-10-23 LAB — CBC
HEMATOCRIT: 36.5 % — AB (ref 39.0–52.0)
HEMOGLOBIN: 12.6 g/dL — AB (ref 13.0–17.0)
MCH: 27.5 pg (ref 26.0–34.0)
MCHC: 34.5 g/dL (ref 30.0–36.0)
MCV: 79.5 fL (ref 78.0–100.0)
Platelets: 177 10*3/uL (ref 150–400)
RBC: 4.59 MIL/uL (ref 4.22–5.81)
RDW: 14.7 % (ref 11.5–15.5)
WBC: 6.3 10*3/uL (ref 4.0–10.5)

## 2017-10-23 LAB — POCT ACTIVATED CLOTTING TIME: Activated Clotting Time: 532 seconds

## 2017-10-23 LAB — HEPARIN LEVEL (UNFRACTIONATED)
HEPARIN UNFRACTIONATED: 0.75 [IU]/mL — AB (ref 0.30–0.70)
HEPARIN UNFRACTIONATED: 0.55 [IU]/mL (ref 0.30–0.70)

## 2017-10-23 LAB — PROTIME-INR
INR: 1.65
Prothrombin Time: 19.4 seconds — ABNORMAL HIGH (ref 11.4–15.2)

## 2017-10-23 LAB — HEMOGLOBIN A1C
Hgb A1c MFr Bld: 8.6 % — ABNORMAL HIGH (ref 4.8–5.6)
Mean Plasma Glucose: 200 mg/dL

## 2017-10-23 SURGERY — RIGHT HEART CATH AND CORONARY/GRAFT ANGIOGRAPHY
Anesthesia: LOCAL

## 2017-10-23 MED ORDER — BIVALIRUDIN TRIFLUOROACETATE 250 MG IV SOLR
INTRAVENOUS | Status: AC
  Filled 2017-10-23: qty 250

## 2017-10-23 MED ORDER — MIDAZOLAM HCL 2 MG/2ML IJ SOLN
INTRAMUSCULAR | Status: DC | PRN
  Administered 2017-10-23: 1 mg via INTRAVENOUS
  Administered 2017-10-23: 2 mg via INTRAVENOUS

## 2017-10-23 MED ORDER — FENTANYL CITRATE (PF) 100 MCG/2ML IJ SOLN
INTRAMUSCULAR | Status: DC | PRN
  Administered 2017-10-23 (×2): 25 ug via INTRAVENOUS

## 2017-10-23 MED ORDER — SODIUM CHLORIDE 0.9% FLUSH: 3.0000 mL | INTRAVENOUS | Status: DC | PRN

## 2017-10-23 MED ORDER — MIDAZOLAM HCL 2 MG/2ML IJ SOLN
INTRAMUSCULAR | Status: AC
  Filled 2017-10-23: qty 2

## 2017-10-23 MED ORDER — SODIUM CHLORIDE 0.9% FLUSH: 3.0000 mL | Freq: Two times a day (BID) | INTRAVENOUS | Status: DC

## 2017-10-23 MED ORDER — HEPARIN (PORCINE) IN NACL 2-0.9 UNITS/ML
INTRAMUSCULAR | Status: AC | PRN
  Administered 2017-10-23 (×2): 500 mL

## 2017-10-23 MED ORDER — BIVALIRUDIN BOLUS VIA INFUSION - CUPID
INTRAVENOUS | Status: DC | PRN
  Administered 2017-10-23: 100.875 mg via INTRAVENOUS

## 2017-10-23 MED ORDER — ASPIRIN 81 MG PO CHEW
81.0000 mg | CHEWABLE_TABLET | Freq: Every day | ORAL | Status: DC
  Administered 2017-10-24 – 2017-10-26 (×3): 81 mg via ORAL
  Filled 2017-10-23 (×3): qty 1

## 2017-10-23 MED ORDER — HYDRALAZINE HCL 20 MG/ML IJ SOLN: 5.0000 mg | INTRAMUSCULAR | Status: AC | PRN

## 2017-10-23 MED ORDER — SODIUM CHLORIDE 0.9 % WEIGHT BASED INFUSION
3.0000 mL/kg/h | INTRAVENOUS | Status: DC
  Administered 2017-10-23: 3 mL/kg/h via INTRAVENOUS

## 2017-10-23 MED ORDER — ATORVASTATIN CALCIUM 40 MG PO TABS
40.0000 mg | ORAL_TABLET | Freq: Every day | ORAL | Status: DC
  Administered 2017-10-23 – 2017-10-25 (×3): 40 mg via ORAL
  Filled 2017-10-23 (×3): qty 1

## 2017-10-23 MED ORDER — SODIUM CHLORIDE 0.9 % IV SOLN
INTRAVENOUS | Status: DC | PRN
  Administered 2017-10-23 (×2): 1.75 mg/kg/h via INTRAVENOUS

## 2017-10-23 MED ORDER — FENTANYL CITRATE (PF) 100 MCG/2ML IJ SOLN
INTRAMUSCULAR | Status: AC
  Filled 2017-10-23: qty 2

## 2017-10-23 MED ORDER — SODIUM CHLORIDE 0.9% FLUSH
3.0000 mL | Freq: Two times a day (BID) | INTRAVENOUS | Status: DC
  Administered 2017-10-25 – 2017-10-26 (×2): 3 mL via INTRAVENOUS

## 2017-10-23 MED ORDER — IOPAMIDOL (ISOVUE-370) INJECTION 76%
INTRAVENOUS | Status: AC
  Filled 2017-10-23: qty 150

## 2017-10-23 MED ORDER — ACETAMINOPHEN 325 MG PO TABS: 650.0000 mg | ORAL_TABLET | ORAL | Status: DC | PRN

## 2017-10-23 MED ORDER — ANGIOPLASTY BOOK
Freq: Once | Status: AC
Start: 2017-10-23 — End: 2017-10-23
  Administered 2017-10-23: 22:00:00
  Filled 2017-10-23: qty 1

## 2017-10-23 MED ORDER — TICAGRELOR 90 MG PO TABS
ORAL_TABLET | ORAL | Status: DC | PRN
  Administered 2017-10-23: 180 mg via ORAL

## 2017-10-23 MED ORDER — SODIUM CHLORIDE 0.9 % IV SOLN: 250.0000 mL | INTRAVENOUS | Status: DC | PRN

## 2017-10-23 MED ORDER — ONDANSETRON HCL 4 MG/2ML IJ SOLN: 4.0000 mg | Freq: Four times a day (QID) | INTRAMUSCULAR | Status: DC | PRN

## 2017-10-23 MED ORDER — HEPARIN (PORCINE) IN NACL 100-0.45 UNIT/ML-% IJ SOLN
1800.0000 [IU]/h | INTRAMUSCULAR | Status: DC
  Administered 2017-10-23: 22:00:00 1700 [IU]/h via INTRAVENOUS
  Administered 2017-10-25 – 2017-10-26 (×3): 1800 [IU]/h via INTRAVENOUS
  Filled 2017-10-23 (×6): qty 250

## 2017-10-23 MED ORDER — HEPARIN (PORCINE) IN NACL 1000-0.9 UT/500ML-% IV SOLN
INTRAVENOUS | Status: AC
  Filled 2017-10-23: qty 1000

## 2017-10-23 MED ORDER — IOPAMIDOL (ISOVUE-370) INJECTION 76%
INTRAVENOUS | Status: AC
  Filled 2017-10-23: qty 100

## 2017-10-23 MED ORDER — SODIUM CHLORIDE 0.9 % WEIGHT BASED INFUSION: 1.0000 mL/kg/h | INTRAVENOUS | Status: DC

## 2017-10-23 MED ORDER — LIDOCAINE HCL (PF) 1 % IJ SOLN
INTRAMUSCULAR | Status: DC | PRN
  Administered 2017-10-23: 15 mL

## 2017-10-23 MED ORDER — TICAGRELOR 90 MG PO TABS
ORAL_TABLET | ORAL | Status: AC
  Filled 2017-10-23: qty 2

## 2017-10-23 MED ORDER — NITROGLYCERIN 1 MG/10 ML FOR IR/CATH LAB
INTRA_ARTERIAL | Status: AC
  Filled 2017-10-23: qty 10

## 2017-10-23 MED ORDER — LIDOCAINE HCL (PF) 1 % IJ SOLN
INTRAMUSCULAR | Status: AC
  Filled 2017-10-23: qty 30

## 2017-10-23 MED ORDER — TICAGRELOR 90 MG PO TABS
90.0000 mg | ORAL_TABLET | Freq: Two times a day (BID) | ORAL | Status: DC
  Administered 2017-10-24 (×3): 90 mg via ORAL
  Filled 2017-10-23 (×3): qty 1

## 2017-10-23 MED ORDER — LABETALOL HCL 5 MG/ML IV SOLN: 10.0000 mg | INTRAVENOUS | Status: AC | PRN

## 2017-10-23 MED ORDER — SODIUM CHLORIDE 0.9 % IV SOLN: INTRAVENOUS | Status: AC

## 2017-10-23 MED ORDER — IOPAMIDOL (ISOVUE-370) INJECTION 76%
INTRAVENOUS | Status: DC | PRN
  Administered 2017-10-23: 240 mL

## 2017-10-23 SURGICAL SUPPLY — 36 items
BALLN EMERGE MR 4.0X12 (BALLOONS) ×2
BALLN SAPPHIRE 2.0X12 (BALLOONS) ×2
BALLN ~~LOC~~ EMERGE MR 5.5X12 (BALLOONS) ×2
BALLOON EMERGE MR 4.0X12 (BALLOONS) IMPLANT
BALLOON SAPPHIRE 2.0X12 (BALLOONS) IMPLANT
BALLOON ~~LOC~~ EMERGE MR 5.5X12 (BALLOONS) IMPLANT
CATH INFINITI 5 FR 3DRC (CATHETERS) ×1 IMPLANT
CATH INFINITI 5 FR AR2 MOD (CATHETERS) ×1 IMPLANT
CATH INFINITI 5FR JL4 (CATHETERS) ×1 IMPLANT
CATH INFINITI 5FR JL5 (CATHETERS) ×1 IMPLANT
CATH INFINITI JR4 5F (CATHETERS) ×1 IMPLANT
CATH LAUNCHER 6FR 3DRC (CATHETERS) IMPLANT
CATH LAUNCHER 6FR AR2 (CATHETERS) ×1 IMPLANT
CATH SWAN GANZ 7F STRAIGHT (CATHETERS) ×1 IMPLANT
CATHETER LAUNCHER 6FR 3DRC (CATHETERS) ×2
COVER PRB 48X5XTLSCP FOLD TPE (BAG) IMPLANT
COVER PROBE 5X48 (BAG) ×2
GUIDEWIRE INQWIRE 1.5J.035X260 (WIRE) IMPLANT
INQWIRE 1.5J .035X260CM (WIRE)
KIT ENCORE 26 ADVANTAGE (KITS) ×1 IMPLANT
KIT HEART LEFT (KITS) ×2 IMPLANT
KIT HEMO VALVE WATCHDOG (MISCELLANEOUS) ×1 IMPLANT
NDL PERC 21GX4CM (NEEDLE) IMPLANT
NEEDLE PERC 21GX4CM (NEEDLE) ×2 IMPLANT
PACK CARDIAC CATHETERIZATION (CUSTOM PROCEDURE TRAY) ×2 IMPLANT
SHEATH PINNACLE 5F 10CM (SHEATH) ×1 IMPLANT
SHEATH PINNACLE 6F 10CM (SHEATH) ×1 IMPLANT
SHEATH PINNACLE 7F 10CM (SHEATH) ×1 IMPLANT
SHEATH RAIN RADIAL 21G 6FR (SHEATH) IMPLANT
STENT RESOLUTE ONYX 5.0X15 (Permanent Stent) ×1 IMPLANT
TRANSDUCER W/STOPCOCK (MISCELLANEOUS) ×2 IMPLANT
TUBING CIL FLEX 10 FLL-RA (TUBING) ×2 IMPLANT
WIRE ASAHI PROWATER 180CM (WIRE) ×1 IMPLANT
WIRE EMERALD 3MM-J .025X260CM (WIRE) ×1 IMPLANT
WIRE EMERALD 3MM-J .035X150CM (WIRE) ×1 IMPLANT
WIRE FIGHTER CROSSING 190CM (WIRE) ×1 IMPLANT

## 2017-10-23 NOTE — Progress Notes (Signed)
Site area: right Right Groin  Site Prior to Removal:  Level 0  0   Pressure Applied For 30 MINUTES    Minutes Beginning at 1830   Manual:  Yes Yes.    Patient Status During Pull:  Stable  Post Pull Groin Site:  Level 0 0  Post Pull Instructions Given: yes Yes.    Post Pull Pulses Present: Yes Yes.    Dressing Applied:Yes   Yes.     Comments:  Patient tolerated procedure well.  Pressure dressing applied and post pull instructions given to patient.  Patient voices understanding.

## 2017-10-23 NOTE — Interval H&P Note (Signed)
Cath Lab Visit (complete for each Cath Lab visit)  Clinical Evaluation Leading to the Procedure:   ACS: Yes.    Non-ACS:    Anginal Classification: CCS IV  Anti-ischemic medical therapy: Minimal Therapy (1 class of medications)  Non-Invasive Test Results: No non-invasive testing performed  Prior CABG: Previous CABG      History and Physical Interval Note:  10/23/2017 1:48 PM  [name]  has presented today for surgery, with the diagnosis of ua  The various methods of treatment have been discussed with the patient and family. After consideration of risks, benefits and other options for treatment, the patient has consented to  Procedure(s): LEFT HEART CATH AND CORS/GRAFTS ANGIOGRAPHY (N/A) as a surgical intervention .  The patient's history has been reviewed, patient examined, no change in status, stable for surgery.  I have reviewed the patient's chart and labs.  Questions were answered to the patient's satisfaction.     Larae Grooms

## 2017-10-23 NOTE — Progress Notes (Signed)
Progress Note  Patient Name: [name] Date of Encounter: 10/23/2017  Primary Cardiologist: Zachary Ruths, MD   Subjective   Doing ok at rest, but recent change in baseline status. Exertional dyspnea and decreased exercise tolerance. Was going to the gym 5 days a week but now unable to take his trash to the end of his drive way due to exertional dyspnea, similar to symptoms in 2015 when he needed a coronary stent. No chest pain. Asymptomatic at rest.    Inpatient Medications    Scheduled Meds: . aspirin  81 mg Oral Daily  . cholecalciferol  1,000 Units Oral Daily  . cloNIDine  0.1 mg Oral Daily  . furosemide  40 mg Oral See admin instructions  . levothyroxine  50 mcg Oral QAC breakfast  . losartan  100 mg Oral Daily  . pravastatin  40 mg Oral Daily  . warfarin  5 mg Oral Q T,Th,S,Su-1800  . warfarin  7.5 mg Oral Q M,W,F-1800  . Warfarin - Physician Dosing Inpatient   Does not apply q1800   Continuous Infusions: . heparin 1,700 Units/hr (10/23/17 8185)   PRN Meds: acetaminophen, nitroGLYCERIN, ondansetron (ZOFRAN) IV   Vital Signs    Vitals:   10/23/17 0016 10/23/17 0436 10/23/17 0601 10/23/17 0914  BP: 118/85 (!) 139/100 (!) 122/91 127/86  Pulse: (!) 109 88 (!) 101 (!) 42  Resp:    20  Temp: 97.9 F (36.6 C) 98.2 F (36.8 C)    TempSrc: Oral Oral    SpO2: 97% 96%  96%  Weight:  296 lb 8 oz (134.5 kg)    Height:        Intake/Output Summary (Last 24 hours) at 10/23/2017 0940 Last data filed at 10/23/2017 0810 Gross per 24 hour  Intake 924.3 ml  Output 1275 ml  Net -350.7 ml   Filed Weights   10/22/17 0000 10/22/17 1518 10/23/17 0436  Weight: 299 lb (135.6 kg) 296 lb 8 oz (134.5 kg) 296 lb 8 oz (134.5 kg)    Telemetry    Atrial fibrillation/ flutter w/ CVR - Personally Reviewed  ECG    Atrial flutter w/ variable conduction, CVR in the 90s - Personally Reviewed  Physical Exam   GEN: No acute distress.   Neck: No JVD Cardiac:  irregular rhythm,  regular rate, crisp mechanical valve sounds Respiratory: Clear to auscultation bilaterally. GI: Soft, nontender, non-distended  MS: No edema; No deformity. Neuro:  Nonfocal  Psych: Normal affect   Labs    Chemistry Recent Labs  Lab 10/21/17 1520 10/22/17 0219  NA 141  --   K 3.4*  --   CL 104  --   CO2 27  --   GLUCOSE 131*  --   BUN 10  --   CREATININE 1.13 1.15  CALCIUM 9.0  --   GFRNONAA >60 >60  GFRAA >60 >60  ANIONGAP 10  --      Hematology Recent Labs  Lab 10/22/17 0219 10/22/17 0947 10/23/17 0424  WBC 7.1 6.3 6.3  RBC 4.81 4.98 4.59  HGB 12.9* 13.6 12.6*  HCT 38.1* 39.5 36.5*  MCV 79.2 79.3 79.5  MCH 26.8 27.3 27.5  MCHC 33.9 34.4 34.5  RDW 14.8 14.9 14.7  PLT 186 179 177    Cardiac Enzymes Recent Labs  Lab 10/22/17 0219 10/22/17 0947  TROPONINI <0.03 <0.03    Recent Labs  Lab 10/21/17 1600  TROPIPOC 0.00     BNP Recent Labs  Lab 10/21/17 1935  BNP 134.6*     DDimer No results for input(s): DDIMER in the last 168 hours.   Radiology    Dg Chest 2 View  Result Date: 10/21/2017 CLINICAL DATA:  Shortness of Breath EXAM: CHEST - 2 VIEW COMPARISON:  08/26/2017 FINDINGS: Cardiac shadow is mildly enlarged but stable postsurgical changes are again seen. The lungs are well aerated bilaterally. Mild vascular congestion is again seen without focal interstitial edema or sizable effusion. No focal infiltrate is noted. No bony abnormality is seen. IMPRESSION: Mild vascular congestion stable in appearance from the prior exam. No acute abnormality is noted. Electronically Signed   By: [name] M.D.   On: 10/21/2017 16:08    Cardiac Studies   2D Echo pending  Previous Echo 06/01/15 Study Conclusions  - Left ventricle: The cavity size was mildly dilated. There was   mild concentric hypertrophy. Systolic function was normal. The   estimated ejection fraction was in the range of 55% to 60%. Wall   motion was normal; there were no regional wall  motion   abnormalities. Features are consistent with a pseudonormal left   ventricular filling pattern, with concomitant abnormal relaxation   and increased filling pressure (grade 2 diastolic dysfunction). - Aortic valve: A mechanical prosthesis was present. Mean gradient   (S): 19 mm Hg. Valve area (VTI): 1.36 cm^2. Valve area (Vmax):   1.22 cm^2. Valve area (Vmean): 1.19 cm^2. - Left atrium: The atrium was moderately dilated.   LHC 07/19/17 ANGIOGRAPHIC DATA:   The left main coronary artery is widely patent.  The left anterior descending artery is a large vessel proximally. The LAD appears intragraft be normal and reaches the apex. There is a large first diagonal which is widely patent.  The left circumflex artery is large vessel which appears widely patent.  The first obtuse marginal is medium size and patent. Second and third obtuse marginals are large and widely patent..  The right coronary artery is occluded at the ostium.  The SVG to RCA has an ostial 90% stenosis with a 40 mm pressure gradient upon engagement of the catheter.  IMPRESSIONS:  1. No significant left sided coronary artery disease. 2. Occluded native right coronary artery.  90% ostial stenosis in the SVG to RCA which was successfully treated with a 4.5 x 23mm rebel bare-metal stent, postdilated to 5.2 mm in diameter. 3. Fluoroscopy in multiple different views shows that the tilting disc aortic valve opens well. 4.  Ascending aortic angiogram reveals a dilated root with some residual dissection in the  arch likely just distal to the origin of the innominate.   Patient Profile     [name] is a 52 year old gentleman with CAD s/p remote CABG in 2000 and PCI in 2015, aortic dissection s/p bentall with mechanical AVR in 2000, Atrial fibrillation, chronic anticoagulation w/ coumadin, DM, HTN and HL who presents due to shortness of breath.   Assessment & Plan    1. Exertional Dyspnea: ? Anginal equivalent.  Marked change in exercise tolerance due to symptoms. Notes that he usually goes to the gym on a regular basis (5 days a week) but on the day of presentation was unable to even take the trash cans down the hill of his driveway without having to stop and rest. Similar to symptoms prior to PCI in 2015. Cardiac enzymes are negative x. 3. Given his significant cardiovascular history, recommend definitive cath. BNP slightly abnormal at 134. PO Lasix ordered. 2D echo pending. Unlikely to be  acute PE given therapeutic anticoagulation previously (although INR 1.8 on admit). He is on IV heparin. If negative ischemic w/u, then may need to r/u PE.   2. CAD: per above. H/o CABG. Last cardiac cath 06/2013 found 90% ostial stenosis in the SVG-RCA, treated with PCI + BMS. Now with exertional dyspnea, concerning for unstable angina. Cardiac enzymes are negative. Recommend repeat cardiac cath for definitive assessment. Continue ASA and statin.   3. Mechanical Aortic Valve: INR was subtherapeutic on admit at 1.8. Pharmacy following to help with heparin bridge and coumadin dosing. 2D echo pending.   4. H/o Aortic Dissection: s/p Bentall w/ mechanical aortic valve.   5. Atrial Flutter: rate is controlled. On anticoagulation with coumadin. Currently on heparin.  6. HLD: LDL elevated at 87 mg/dL. Goal in the setting of CAD is < 70 mg/dL. Currently on Pravastatin 40 mg. Previously on simvastatin. Per med review, I do not see where he has ever tried atorvastatin or rosuvastatin. Can try atorvastatin 40 mg. Repeat FLP and HFTs in 8 weeks.   7. HTN: controlled on current regimen.   For questions or updates, please contact [place] Please consult [email or web address] for contact info under Cardiology/STEMI.      Signed, Lyda Jester, PA-C  10/23/2017, 9:40 AM

## 2017-10-23 NOTE — Plan of Care (Signed)
  Problem: Activity: Goal: Risk for activity intolerance will decrease Outcome: Completed/Met   Problem: Coping: Goal: Level of anxiety will decrease Outcome: Completed/Met   Problem: Pain Managment: Goal: General experience of comfort will improve Outcome: Completed/Met   Problem: Safety: Goal: Ability to remain free from injury will improve Outcome: Completed/Met   Problem: Skin Integrity: Goal: Risk for impaired skin integrity will decrease Outcome: Completed/Met   Problem: Activity: Goal: Capacity to carry out activities will improve Outcome: Completed/Met

## 2017-10-23 NOTE — Progress Notes (Signed)
Cath lab called to get patient ready for transport to cath lab.  Heparin drip stopped.  Patient denies chest pain.  Patient allowed NT to complete clip of right and left radial areas and refused her to clip both groins.  Patient is clipping his groins himself

## 2017-10-23 NOTE — Progress Notes (Signed)
  Echocardiogram 2D Echocardiogram has been attempted. Patient leaving for Cath Lab.  [name] G Kimon Loewen 10/23/2017, 1:18 PM

## 2017-10-23 NOTE — Progress Notes (Addendum)
[place] for heparin, resume 6 hour after sheath pull post cath Indication: Afib and AVR  Allergies  Allergen Reactions  . Insulin Glargine Swelling    edema    Patient Measurements: Height: 6\' 2"  (188 cm) Weight: 296 lb 8 oz (134.5 kg) IBW/kg (Calculated) : 82.2 Heparin Dosing Weight: 115kg  Vital Signs: Temp: 97.6 F (36.4 C) (06/03 1643) Temp Source: Oral (06/03 1643) BP: 134/106 (06/03 1643) Pulse Rate: 97 (06/03 1643)  Labs: Recent Labs    10/21/17 1520 10/21/17 1935 10/22/17 0219 10/22/17 0947 10/23/17 0424 10/23/17 1208  HGB 13.3  --  12.9* 13.6 12.6*  --   HCT 38.6*  --  38.1* 39.5 36.5*  --   PLT 206  --  186 179 177  --   LABPROT  --  20.7*  --  20.2* 19.4*  --   INR  --  1.80  --  1.74 1.65  --   HEPARINUNFRC  --   --   --  0.62 0.75* 0.55  CREATININE 1.13  --  1.15  --   --   --   TROPONINI  --   --  <0.03 <0.03  --   --     Estimated Creatinine Clearance: 109.6 mL/min (by C-G formula based on SCr of 1.15 mg/dL).   Medical History: Past Medical History:  Diagnosis Date  . Ascending aortic dissection ([place])    a. 2000 - with aortic valve involvement. S/p repair of aortic dissection with placement of mechanical AVR.  [name] CAD (coronary artery disease)    a. 2000 VG->PDA @ time of Ao dissection repair;  b. Cardiac CT 06/2010: no CAD, only mild plaque in prox RCA;  c. 2015 Cath: LM nl, LAD nl, LCX nl, RCA 100, VG->PDA 90 (4.5x12 Rebel BMS).  . Cholelithiasis 08/22/2013  . Chronic diastolic CHF (congestive heart failure) ([place])    a. 03/2016 Echo: EF 55-60%, no rwma, Gr2 DD.  . Diabetes mellitus   . Dyspnea   . Gross hematuria   . H/O mechanical aortic valve replacement    a. 03/2016 Echo: AoV mean gradient 26mmHg, Valve Area (VTI) 1.36 cm^2, (Vmax) 1.22 cm^2, mod dil LA.  [name] Hyperlipidemia   . Hypertension   . NEPHROLITHIASIS, HX OF   . Obesity   . PAF (paroxysmal atrial fibrillation) ([place])    a. H/o such, with  recurrence of coarse afib/flutter during ER consult 03/2012.  [name] TRANSIENT ISCHEMIC ATTACK, HX OF    a. In 2004.  [name] Unspecified vitamin D deficiency     [name], on Coumadin 5mg  daily exc for 7.5mg  on MWF PTA for Afib. Subtherapeutic INR at 1.65 this morning (goal 2-3 per [facility] clinic notes) w/ known Afib and h/o AVR, on heparin bridge.  Heparin level  0.55, was therapeutic today on heparin drip rate 1700 units/hr.  Now s/p cardiac cath, pharmacy consuted to resume IV heparin infusion for Mechanical heart valve, afib; INR subtherapeutic at 1.8; restart heparin 6 hours post sheath pull.  Sheath removed at 16:10.  No hematoma, no bleeding noted.   Goal of Therapy:  Heparin level 0.3-0.7 units/ml Monitor platelets by anticoagulation protocol: Yes   Plan:  At 22:15 tonight, 6 hours after sheath pulled, resume IV heparin gtt at 1,700 units/hr  Followup 6 hour heparin level. Monitor daily heparin level, CBC, s/s of bleed MD dosing Coumadin   Nicole Cella, [place] Clinical Pharmacist Pager: [phone]  10/23/2017 4:59 PM

## 2017-10-23 NOTE — Progress Notes (Signed)
ANTICOAGULATION CONSULT NOTE - Follow Up Consult  Pharmacy Consult for heparin Indication: Afib and AVR  Labs: Recent Labs    10/21/17 1520 10/21/17 1935 10/22/17 0219 10/22/17 0947 10/23/17 0424  HGB 13.3  --  12.9* 13.6 12.6*  HCT 38.6*  --  38.1* 39.5 36.5*  PLT 206  --  186 179 177  LABPROT  --  20.7*  --  20.2* 19.4*  INR  --  1.80  --  1.74 1.65  HEPARINUNFRC  --   --   --  0.62 0.75*  CREATININE 1.13  --  1.15  --   --   TROPONINI  --   --  <0.03 <0.03  --     Assessment: 52yo male now slightly supratherapeutic on heparin after one level at upper end of goal; no overt signs of bleeding per RN.  Goal of Therapy:  Heparin level 0.3-0.7 units/ml   Plan:  Will decrease heparin gtt by 1 unit/kgABW/hr to 1700 units/hr and check level in 6 hours.    Wynona Neat, PharmD, BCPS  10/23/2017,6:07 AM

## 2017-10-23 NOTE — H&P (View-Only) (Signed)
Progress Note  Patient Name: [name] Date of Encounter: 10/23/2017  Primary Cardiologist: Kirk Ruths, MD   Subjective   Doing ok at rest, but recent change in baseline status. Exertional dyspnea and decreased exercise tolerance. Was going to the gym 5 days a week but now unable to take his trash to the end of his drive way due to exertional dyspnea, similar to symptoms in 2015 when he needed a coronary stent. No chest pain. Asymptomatic at rest.    Inpatient Medications    Scheduled Meds: . aspirin  81 mg Oral Daily  . cholecalciferol  1,000 Units Oral Daily  . cloNIDine  0.1 mg Oral Daily  . furosemide  40 mg Oral See admin instructions  . levothyroxine  50 mcg Oral QAC breakfast  . losartan  100 mg Oral Daily  . pravastatin  40 mg Oral Daily  . warfarin  5 mg Oral Q T,Th,S,Su-1800  . warfarin  7.5 mg Oral Q M,W,F-1800  . Warfarin - Physician Dosing Inpatient   Does not apply q1800   Continuous Infusions: . heparin 1,700 Units/hr (10/23/17 9233)   PRN Meds: acetaminophen, nitroGLYCERIN, ondansetron (ZOFRAN) IV   Vital Signs    Vitals:   10/23/17 0016 10/23/17 0436 10/23/17 0601 10/23/17 0914  BP: 118/85 (!) 139/100 (!) 122/91 127/86  Pulse: (!) 109 88 (!) 101 (!) 42  Resp:    20  Temp: 97.9 F (36.6 C) 98.2 F (36.8 C)    TempSrc: Oral Oral    SpO2: 97% 96%  96%  Weight:  296 lb 8 oz (134.5 kg)    Height:        Intake/Output Summary (Last 24 hours) at 10/23/2017 0940 Last data filed at 10/23/2017 0810 Gross per 24 hour  Intake 924.3 ml  Output 1275 ml  Net -350.7 ml   Filed Weights   10/22/17 0000 10/22/17 1518 10/23/17 0436  Weight: 299 lb (135.6 kg) 296 lb 8 oz (134.5 kg) 296 lb 8 oz (134.5 kg)    Telemetry    Atrial fibrillation/ flutter w/ CVR - Personally Reviewed  ECG    Atrial flutter w/ variable conduction, CVR in the 90s - Personally Reviewed  Physical Exam   GEN: No acute distress.   Neck: No JVD Cardiac:  irregular rhythm,  regular rate, crisp mechanical valve sounds Respiratory: Clear to auscultation bilaterally. GI: Soft, nontender, non-distended  MS: No edema; No deformity. Neuro:  Nonfocal  Psych: Normal affect   Labs    Chemistry Recent Labs  Lab 10/21/17 1520 10/22/17 0219  NA 141  --   K 3.4*  --   CL 104  --   CO2 27  --   GLUCOSE 131*  --   BUN 10  --   CREATININE 1.13 1.15  CALCIUM 9.0  --   GFRNONAA >60 >60  GFRAA >60 >60  ANIONGAP 10  --      Hematology Recent Labs  Lab 10/22/17 0219 10/22/17 0947 10/23/17 0424  WBC 7.1 6.3 6.3  RBC 4.81 4.98 4.59  HGB 12.9* 13.6 12.6*  HCT 38.1* 39.5 36.5*  MCV 79.2 79.3 79.5  MCH 26.8 27.3 27.5  MCHC 33.9 34.4 34.5  RDW 14.8 14.9 14.7  PLT 186 179 177    Cardiac Enzymes Recent Labs  Lab 10/22/17 0219 10/22/17 0947  TROPONINI <0.03 <0.03    Recent Labs  Lab 10/21/17 1600  TROPIPOC 0.00     BNP Recent Labs  Lab 10/21/17 1935  BNP 134.6*     DDimer No results for input(s): DDIMER in the last 168 hours.   Radiology    Dg Chest 2 View  Result Date: 10/21/2017 CLINICAL DATA:  Shortness of Breath EXAM: CHEST - 2 VIEW COMPARISON:  08/26/2017 FINDINGS: Cardiac shadow is mildly enlarged but stable postsurgical changes are again seen. The lungs are well aerated bilaterally. Mild vascular congestion is again seen without focal interstitial edema or sizable effusion. No focal infiltrate is noted. No bony abnormality is seen. IMPRESSION: Mild vascular congestion stable in appearance from the prior exam. No acute abnormality is noted. Electronically Signed   By: Inez Catalina M.D.   On: 10/21/2017 16:08    Cardiac Studies   2D Echo pending  Previous Echo 06/01/15 Study Conclusions  - Left ventricle: The cavity size was mildly dilated. There was   mild concentric hypertrophy. Systolic function was normal. The   estimated ejection fraction was in the range of 55% to 60%. Wall   motion was normal; there were no regional wall  motion   abnormalities. Features are consistent with a pseudonormal left   ventricular filling pattern, with concomitant abnormal relaxation   and increased filling pressure (grade 2 diastolic dysfunction). - Aortic valve: A mechanical prosthesis was present. Mean gradient   (S): 19 mm Hg. Valve area (VTI): 1.36 cm^2. Valve area (Vmax):   1.22 cm^2. Valve area (Vmean): 1.19 cm^2. - Left atrium: The atrium was moderately dilated.   LHC 07/19/17 ANGIOGRAPHIC DATA:   The left main coronary artery is widely patent.  The left anterior descending artery is a large vessel proximally. The LAD appears intragraft be normal and reaches the apex. There is a large first diagonal which is widely patent.  The left circumflex artery is large vessel which appears widely patent.  The first obtuse marginal is medium size and patent. Second and third obtuse marginals are large and widely patent..  The right coronary artery is occluded at the ostium.  The SVG to RCA has an ostial 90% stenosis with a 40 mm pressure gradient upon engagement of the catheter.  IMPRESSIONS:  1. No significant left sided coronary artery disease. 2. Occluded native right coronary artery.  90% ostial stenosis in the SVG to RCA which was successfully treated with a 4.5 x 67mm rebel bare-metal stent, postdilated to 5.2 mm in diameter. 3. Fluoroscopy in multiple different views shows that the tilting disc aortic valve opens well. 4.  Ascending aortic angiogram reveals a dilated root with some residual dissection in the  arch likely just distal to the origin of the innominate.   Patient Profile     [name] is a 52 year old gentleman with CAD s/p remote CABG in 2000 and PCI in 2015, aortic dissection s/p bentall with mechanical AVR in 2000, Atrial fibrillation, chronic anticoagulation w/ coumadin, DM, HTN and HL who presents due to shortness of breath.   Assessment & Plan    1. Exertional Dyspnea: ? Anginal equivalent.  Marked change in exercise tolerance due to symptoms. Notes that he usually goes to the gym on a regular basis (5 days a week) but on the day of presentation was unable to even take the trash cans down the hill of his driveway without having to stop and rest. Similar to symptoms prior to PCI in 2015. Cardiac enzymes are negative x. 3. Given his significant cardiovascular history, recommend definitive cath. BNP slightly abnormal at 134. PO Lasix ordered. 2D echo pending. Unlikely to be  acute PE given therapeutic anticoagulation previously (although INR 1.8 on admit). He is on IV heparin. If negative ischemic w/u, then may need to r/u PE.   2. CAD: per above. H/o CABG. Last cardiac cath 06/2013 found 90% ostial stenosis in the SVG-RCA, treated with PCI + BMS. Now with exertional dyspnea, concerning for unstable angina. Cardiac enzymes are negative. Recommend repeat cardiac cath for definitive assessment. Continue ASA and statin.   3. Mechanical Aortic Valve: INR was subtherapeutic on admit at 1.8. Pharmacy following to help with heparin bridge and coumadin dosing. 2D echo pending.   4. H/o Aortic Dissection: s/p Bentall w/ mechanical aortic valve.   5. Atrial Flutter: rate is controlled. On anticoagulation with coumadin. Currently on heparin.  6. HLD: LDL elevated at 87 mg/dL. Goal in the setting of CAD is < 70 mg/dL. Currently on Pravastatin 40 mg. Previously on simvastatin. Per med review, I do not see where he has ever tried atorvastatin or rosuvastatin. Can try atorvastatin 40 mg. Repeat FLP and HFTs in 8 weeks.   7. HTN: controlled on current regimen.   For questions or updates, please contact [place] Please consult [email or web address] for contact info under Cardiology/STEMI.      Signed, Lyda Jester, PA-C  10/23/2017, 9:40 AM

## 2017-10-23 NOTE — Progress Notes (Signed)
Inpatient Diabetes Program Recommendations  AACE/ADA: New Consensus Statement on Inpatient Glycemic Control (2019)  Target Ranges:  Prepandial:   less than 140 mg/dL      Peak postprandial:   less than 180 mg/dL (1-2 hours)      Critically ill patients:  140 - 180 mg/dL   Results for IFEANYICHUKWU, WICKHAM (MRN [id number]) as of 10/23/2017 09:25  Ref. Range 10/22/2017 08:12 10/22/2017 12:33 10/22/2017 16:21 10/22/2017 20:52 10/23/2017 08:13  Glucose-Capillary Latest Ref Range: 65 - 99 mg/dL 140 (H) 125 (H) 142 (H) 176 (H) 162 (H)   Review of Glycemic Control  Diabetes history: DM2 Outpatient Diabetes medications: Amaryl 1 mg daily, Metformin 1000 mg BID Current orders for Inpatient glycemic control: None  Inpatient Diabetes Program Recommendations: Correction (SSI): While inpatient, please consider ordering CBGs with Novolog 0-9 units TID with meals and Novolog 0-5 units QHS.  NOTE: Noted patient has allergy to glargine (Lantus) and per chart review patient has been given Novolog during prior hospitalization without any noted issues.  Thanks, Barnie Alderman, RN, MSN, CDE Diabetes Coordinator Inpatient Diabetes Program [phone] (Team Pager from 8am to 5pm)

## 2017-10-23 NOTE — Progress Notes (Addendum)
[place] for heparin Indication: Afib and AVR  Allergies  Allergen Reactions  . Insulin Glargine Swelling    edema    Patient Measurements: Height: 6\' 2"  (188 cm) Weight: 296 lb 8 oz (134.5 kg) IBW/kg (Calculated) : 82.2 Heparin Dosing Weight: 115kg  Vital Signs: Temp: 98.2 F (36.8 C) (06/03 0436) Temp Source: Oral (06/03 0436) BP: 127/86 (06/03 0914) Pulse Rate: 42 (06/03 0914)  Labs: Recent Labs    10/21/17 1520 10/21/17 1935 10/22/17 0219 10/22/17 0947 10/23/17 0424 10/23/17 1208  HGB 13.3  --  12.9* 13.6 12.6*  --   HCT 38.6*  --  38.1* 39.5 36.5*  --   PLT 206  --  186 179 177  --   LABPROT  --  20.7*  --  20.2* 19.4*  --   INR  --  1.80  --  1.74 1.65  --   HEPARINUNFRC  --   --   --  0.62 0.75* 0.55  CREATININE 1.13  --  1.15  --   --   --   TROPONINI  --   --  <0.03 <0.03  --   --     Estimated Creatinine Clearance: 109.6 mL/min (by C-G formula based on SCr of 1.15 mg/dL).   Medical History: Past Medical History:  Diagnosis Date  . Ascending aortic dissection ([place])    a. 2000 - with aortic valve involvement. S/p repair of aortic dissection with placement of mechanical AVR.  [name] CAD (coronary artery disease)    a. 2000 VG->PDA @ time of Ao dissection repair;  b. Cardiac CT 06/2010: no CAD, only mild plaque in prox RCA;  c. 2015 Cath: LM nl, LAD nl, LCX nl, RCA 100, VG->PDA 90 (4.5x12 Rebel BMS).  . Cholelithiasis 08/22/2013  . Chronic diastolic CHF (congestive heart failure) ([place])    a. 03/2016 Echo: EF 55-60%, no rwma, Gr2 DD.  . Diabetes mellitus   . Dyspnea   . Gross hematuria   . H/O mechanical aortic valve replacement    a. 03/2016 Echo: AoV mean gradient 47mmHg, Valve Area (VTI) 1.36 cm^2, (Vmax) 1.22 cm^2, mod dil LA.  [name] Hyperlipidemia   . Hypertension   . NEPHROLITHIASIS, HX OF   . Obesity   . PAF (paroxysmal atrial fibrillation) ([place])    a. H/o such, with recurrence of coarse afib/flutter during ER  consult 03/2012.  [name] TRANSIENT ISCHEMIC ATTACK, HX OF    a. In 2004.  [name] Unspecified vitamin D deficiency     [name], on Coumadin 5mg  daily exc for 7.5mg  on MWF PTA for Afib. Subtherapeutic INR at 1.65 (goal 2-3 per [facility] clinic notes) w/ known Afib and h/o AVR, on heparin bridge.   Heparin level therapeutic after rate decrease: 0.55, CBC stable over night, no overt bleeding reported; Patient likely to go for cardiac cath soon.   Goal of Therapy:  Heparin level 0.3-0.7 units/ml Monitor platelets by anticoagulation protocol: Yes   Plan:  Continue heparin gtt at 1,700 units/hr  Monitor daily heparin level, CBC, s/s of bleed MD dosing Coumadin   Georga Bora, PharmD Clinical Pharmacist 10/23/2017 12:39 PM

## 2017-10-23 NOTE — Progress Notes (Signed)
NS started at 403 ml/hr x 1 hour, pre-cath then will decrease rate to 134.5 ml/hr.  Consent obtained for Cardiac Cath today by Dr. Martinique.  Patient's daughter at bedside.  Patient and family updated on plan of care.

## 2017-10-24 ENCOUNTER — Observation Stay (HOSPITAL_BASED_OUTPATIENT_CLINIC_OR_DEPARTMENT_OTHER): Payer: Medicare HMO

## 2017-10-24 ENCOUNTER — Encounter (HOSPITAL_COMMUNITY): Payer: Self-pay | Admitting: Interventional Cardiology

## 2017-10-24 DIAGNOSIS — Z8249 Family history of ischemic heart disease and other diseases of the circulatory system: Secondary | ICD-10-CM | POA: Diagnosis not present

## 2017-10-24 DIAGNOSIS — I5031 Acute diastolic (congestive) heart failure: Secondary | ICD-10-CM | POA: Diagnosis not present

## 2017-10-24 DIAGNOSIS — R0609 Other forms of dyspnea: Secondary | ICD-10-CM | POA: Diagnosis not present

## 2017-10-24 DIAGNOSIS — E119 Type 2 diabetes mellitus without complications: Secondary | ICD-10-CM | POA: Diagnosis present

## 2017-10-24 DIAGNOSIS — I272 Pulmonary hypertension, unspecified: Secondary | ICD-10-CM | POA: Diagnosis present

## 2017-10-24 DIAGNOSIS — Z955 Presence of coronary angioplasty implant and graft: Secondary | ICD-10-CM

## 2017-10-24 DIAGNOSIS — R791 Abnormal coagulation profile: Secondary | ICD-10-CM | POA: Diagnosis present

## 2017-10-24 DIAGNOSIS — I4892 Unspecified atrial flutter: Secondary | ICD-10-CM | POA: Diagnosis present

## 2017-10-24 DIAGNOSIS — Z8679 Personal history of other diseases of the circulatory system: Secondary | ICD-10-CM | POA: Diagnosis not present

## 2017-10-24 DIAGNOSIS — I5023 Acute on chronic systolic (congestive) heart failure: Secondary | ICD-10-CM | POA: Diagnosis not present

## 2017-10-24 DIAGNOSIS — Z952 Presence of prosthetic heart valve: Secondary | ICD-10-CM | POA: Diagnosis not present

## 2017-10-24 DIAGNOSIS — R0602 Shortness of breath: Secondary | ICD-10-CM | POA: Diagnosis present

## 2017-10-24 DIAGNOSIS — I340 Nonrheumatic mitral (valve) insufficiency: Secondary | ICD-10-CM

## 2017-10-24 DIAGNOSIS — E559 Vitamin D deficiency, unspecified: Secondary | ICD-10-CM | POA: Diagnosis present

## 2017-10-24 DIAGNOSIS — E876 Hypokalemia: Secondary | ICD-10-CM | POA: Diagnosis present

## 2017-10-24 DIAGNOSIS — Z7901 Long term (current) use of anticoagulants: Secondary | ICD-10-CM | POA: Diagnosis not present

## 2017-10-24 DIAGNOSIS — T82855A Stenosis of coronary artery stent, initial encounter: Secondary | ICD-10-CM | POA: Diagnosis present

## 2017-10-24 DIAGNOSIS — Z8673 Personal history of transient ischemic attack (TIA), and cerebral infarction without residual deficits: Secondary | ICD-10-CM | POA: Diagnosis not present

## 2017-10-24 DIAGNOSIS — Y831 Surgical operation with implant of artificial internal device as the cause of abnormal reaction of the patient, or of later complication, without mention of misadventure at the time of the procedure: Secondary | ICD-10-CM | POA: Diagnosis present

## 2017-10-24 DIAGNOSIS — Z833 Family history of diabetes mellitus: Secondary | ICD-10-CM | POA: Diagnosis not present

## 2017-10-24 DIAGNOSIS — I10 Essential (primary) hypertension: Secondary | ICD-10-CM | POA: Diagnosis not present

## 2017-10-24 DIAGNOSIS — E785 Hyperlipidemia, unspecified: Secondary | ICD-10-CM | POA: Diagnosis present

## 2017-10-24 DIAGNOSIS — I25110 Atherosclerotic heart disease of native coronary artery with unstable angina pectoris: Secondary | ICD-10-CM | POA: Diagnosis present

## 2017-10-24 DIAGNOSIS — I482 Chronic atrial fibrillation: Secondary | ICD-10-CM | POA: Diagnosis present

## 2017-10-24 DIAGNOSIS — I110 Hypertensive heart disease with heart failure: Secondary | ICD-10-CM | POA: Diagnosis present

## 2017-10-24 DIAGNOSIS — I5042 Chronic combined systolic (congestive) and diastolic (congestive) heart failure: Secondary | ICD-10-CM | POA: Diagnosis present

## 2017-10-24 DIAGNOSIS — Z951 Presence of aortocoronary bypass graft: Secondary | ICD-10-CM | POA: Diagnosis not present

## 2017-10-24 DIAGNOSIS — Z9049 Acquired absence of other specified parts of digestive tract: Secondary | ICD-10-CM | POA: Diagnosis not present

## 2017-10-24 DIAGNOSIS — E039 Hypothyroidism, unspecified: Secondary | ICD-10-CM | POA: Diagnosis present

## 2017-10-24 DIAGNOSIS — Z79890 Hormone replacement therapy (postmenopausal): Secondary | ICD-10-CM | POA: Diagnosis not present

## 2017-10-24 DIAGNOSIS — Z87442 Personal history of urinary calculi: Secondary | ICD-10-CM | POA: Diagnosis not present

## 2017-10-24 LAB — CBC
HEMATOCRIT: 35.9 % — AB (ref 39.0–52.0)
Hemoglobin: 12.2 g/dL — ABNORMAL LOW (ref 13.0–17.0)
MCH: 26.8 pg (ref 26.0–34.0)
MCHC: 34 g/dL (ref 30.0–36.0)
MCV: 78.9 fL (ref 78.0–100.0)
Platelets: 176 10*3/uL (ref 150–400)
RBC: 4.55 MIL/uL (ref 4.22–5.81)
RDW: 14.7 % (ref 11.5–15.5)
WBC: 5.8 10*3/uL (ref 4.0–10.5)

## 2017-10-24 LAB — ECHOCARDIOGRAM COMPLETE
HEIGHTINCHES: 74 in
WEIGHTICAEL: 4691.39 [oz_av]

## 2017-10-24 LAB — BASIC METABOLIC PANEL
ANION GAP: 10 (ref 5–15)
BUN: 13 mg/dL (ref 6–20)
CO2: 23 mmol/L (ref 22–32)
Calcium: 8.5 mg/dL — ABNORMAL LOW (ref 8.9–10.3)
Chloride: 105 mmol/L (ref 101–111)
Creatinine, Ser: 1.12 mg/dL (ref 0.61–1.24)
Glucose, Bld: 157 mg/dL — ABNORMAL HIGH (ref 65–99)
POTASSIUM: 3.6 mmol/L (ref 3.5–5.1)
Sodium: 138 mmol/L (ref 135–145)

## 2017-10-24 LAB — HEPARIN LEVEL (UNFRACTIONATED)
HEPARIN UNFRACTIONATED: 0.21 [IU]/mL — AB (ref 0.30–0.70)
HEPARIN UNFRACTIONATED: 0.22 [IU]/mL — AB (ref 0.30–0.70)
HEPARIN UNFRACTIONATED: 0.44 [IU]/mL (ref 0.30–0.70)
Heparin Unfractionated: 0.23 IU/mL — ABNORMAL LOW (ref 0.30–0.70)

## 2017-10-24 LAB — GLUCOSE, CAPILLARY
GLUCOSE-CAPILLARY: 198 mg/dL — AB (ref 65–99)
GLUCOSE-CAPILLARY: 205 mg/dL — AB (ref 65–99)
GLUCOSE-CAPILLARY: 151 mg/dL — AB (ref 65–99)
GLUCOSE-CAPILLARY: 243 mg/dL — AB (ref 65–99)
Glucose-Capillary: 144 mg/dL — ABNORMAL HIGH (ref 65–99)
Glucose-Capillary: 206 mg/dL — ABNORMAL HIGH (ref 65–99)

## 2017-10-24 LAB — PROTIME-INR
INR: 1.76
Prothrombin Time: 20.3 seconds — ABNORMAL HIGH (ref 11.4–15.2)

## 2017-10-24 MED ORDER — DOCUSATE SODIUM 100 MG PO CAPS
100.0000 mg | ORAL_CAPSULE | Freq: Two times a day (BID) | ORAL | Status: DC | PRN
  Administered 2017-10-24: 100 mg via ORAL
  Filled 2017-10-24: qty 1

## 2017-10-24 MED ORDER — FUROSEMIDE 40 MG PO TABS
40.0000 mg | ORAL_TABLET | Freq: Two times a day (BID) | ORAL | Status: DC
  Administered 2017-10-24 – 2017-10-26 (×5): 40 mg via ORAL
  Filled 2017-10-24 (×5): qty 1

## 2017-10-24 MED ORDER — AMLODIPINE BESYLATE 5 MG PO TABS
5.0000 mg | ORAL_TABLET | Freq: Every day | ORAL | Status: DC
  Administered 2017-10-24 – 2017-10-26 (×3): 5 mg via ORAL
  Filled 2017-10-24 (×3): qty 1

## 2017-10-24 MED ORDER — METOPROLOL SUCCINATE ER 50 MG PO TB24
200.0000 mg | ORAL_TABLET | Freq: Every day | ORAL | Status: DC
  Administered 2017-10-24 – 2017-10-26 (×3): 200 mg via ORAL
  Filled 2017-10-24 (×3): qty 4

## 2017-10-24 NOTE — Progress Notes (Signed)
ANTICOAGULATION CONSULT NOTE - Follow Up Consult  Pharmacy Consult for heparin Indication: Afib and AVR  Labs: Recent Labs    10/21/17 1520  10/22/17 0219  10/22/17 0947 10/23/17 0424 10/23/17 1208 10/24/17 0334  HGB 13.3  --  12.9*  --  13.6 12.6*  --  12.2*  HCT 38.6*  --  38.1*  --  39.5 36.5*  --  35.9*  PLT 206  --  186  --  179 177  --  176  LABPROT  --    < >  --   --  20.2* 19.4*  --  20.3*  INR  --    < >  --   --  1.74 1.65  --  1.76  HEPARINUNFRC  --   --   --    < > 0.62 0.75* 0.55 0.21*  CREATININE 1.13  --  1.15  --   --   --   --   --   TROPONINI  --   --  <0.03  --  <0.03  --   --   --    < > = values in this interval not displayed.    Assessment/Plan:  52yo male subtherapeutic on heparin after resumed post-cath though lab drawn early and likely needs more time to accumulate. Will continue gtt at current rate and check additional level.   Wynona Neat, PharmD, BCPS  10/24/2017,4:20 AM

## 2017-10-24 NOTE — Progress Notes (Signed)
  Echocardiogram 2D Echocardiogram has been performed.  [name] 10/24/2017, 9:15 AM

## 2017-10-24 NOTE — Progress Notes (Signed)
CARDIAC REHAB PHASE I   PRE:  Rate/Rhythm: 89 Afib  BP:  Sitting: 151/112      SaO2: 98 RA  MODE:  Ambulation: 500 ft 126 peak HR  POST:  Rate/Rhythm: 105 Afib  BP:  Sitting: 178/108   ->  149/92    SaO2: 99 RA  Pt ambulated 500 ft in hallway independently. No c/o CP. Pt educated on importance of Plavix and ASA after discharge. Pts wife has stent-card. Pt educated on use of NTG, and when to call the MD. Pt given exercise guidelines and told to hold off from the gym until f/u in the office. Pt given heart healthy and diabetic diet sheets. Reviewed lifting restrictions and hot tub restrictions. Pt verbalizes understanding. Will refer to CRP II [place]. Pt waiting on therapeutic INR for d/c. Will continue to follow as low priority.   [id number] Rufina Falco, RN BSN 10/24/2017 10:06 AM

## 2017-10-24 NOTE — Care Management Note (Addendum)
Case Management Note  Patient Details  Name: [name] MRN: [id number] Date of Birth: 28-Jan-1966  Subjective/Objective:  From home, pta indep, s/p stent intervention, NCM gave patient the 30 day free coupon for brilinta, and co pay amt.  No other needs for NCM at this time.  [facility] on [address] has brilinta in stock.   6/5 [name] RN, BSN- patient has been changed to plavix.              Action/Plan: DC home when ready.  Expected Discharge Date:                  Expected Discharge Plan:  Home/Self Care  In-House Referral:     Discharge planning Services  CM Consult, Medication Assistance  Post Acute Care Choice:    Choice offered to:     DME Arranged:    DME Agency:     HH Arranged:    HH Agency:     Status of Service:  Completed, signed off  If discussed at [employer] of Stay Meetings, dates discussed:    Additional Comments:  Zenon Mayo, RN 10/24/2017, 3:18 PM

## 2017-10-24 NOTE — Progress Notes (Addendum)
Progress Note  Patient Name: [name] Date of Encounter: 10/24/2017  Primary Cardiologist: Zachary Ruths, MD   Subjective   [name] says that he feels a lot better since the stent, no chest discomfort and breathing is better,  Although he complains of short breath related to the Brilinta. He states that taking it with Caffeinated beverage helps.  Inpatient Medications    Scheduled Meds: . aspirin  81 mg Oral Daily  . atorvastatin  40 mg Oral q1800  . cholecalciferol  1,000 Units Oral Daily  . cloNIDine  0.1 mg Oral Daily  . furosemide  40 mg Oral BID  . levothyroxine  50 mcg Oral QAC breakfast  . losartan  100 mg Oral Daily  . sodium chloride flush  3 mL Intravenous Q12H  . ticagrelor  90 mg Oral BID  . warfarin  5 mg Oral Q T,Th,S,Su-1800  . warfarin  7.5 mg Oral Q M,W,F-1800  . Warfarin - Physician Dosing Inpatient   Does not apply q1800   Continuous Infusions: . sodium chloride    . heparin 1,700 Units/hr (10/23/17 2208)   PRN Meds: sodium chloride, acetaminophen, nitroGLYCERIN, ondansetron (ZOFRAN) IV, sodium chloride flush   Vital Signs    Vitals:   10/23/17 1900 10/23/17 1905 10/23/17 2000 10/24/17 0400  BP: (!) 129/100 (!) 134/103 126/85 (!) 127/93  Pulse: 100 (!) 103 (!) 102 87  Resp: 18 18 (!) 24 16  Temp:   97.8 F (36.6 C) 98.2 F (36.8 C)  TempSrc:   Oral Oral  SpO2: 100% 99% 100% 98%  Weight:    293 lb 3.4 oz (133 kg)  Height:        Intake/Output Summary (Last 24 hours) at 10/24/2017 0754 Last data filed at 10/24/2017 0600 Gross per 24 hour  Intake 858.24 ml  Output 2425 ml  Net -1566.76 ml   Filed Weights   10/22/17 1518 10/23/17 0436 10/24/17 0400  Weight: 296 lb 8 oz (134.5 kg) 296 lb 8 oz (134.5 kg) 293 lb 3.4 oz (133 kg)    Telemetry    Atrilal flutter 90's-low 100's - Personally Reviewed  ECG    Atrial flutter with variable conduction, mostly 2:1, 103 bpm - Personally Reviewed  Physical Exam   GEN: No acute  distress.   Neck: No JVD Cardiac: Mildly irregular rhythm, no murmurs, rubs, or gallops. Crisp mechanical click.  Respiratory: Clear to auscultation bilaterally. GI: Soft, nontender, non-distended  MS: No edema; No deformity. Neuro:  Nonfocal  Psych: Normal affect   Labs    Chemistry Recent Labs  Lab 10/21/17 1520 10/22/17 0219 10/24/17 0334  NA 141  --  138  K 3.4*  --  3.6  CL 104  --  105  CO2 27  --  23  GLUCOSE 131*  --  157*  BUN 10  --  13  CREATININE 1.13 1.15 1.12  CALCIUM 9.0  --  8.5*  GFRNONAA >60 >60 >60  GFRAA >60 >60 >60  ANIONGAP 10  --  10     Hematology Recent Labs  Lab 10/22/17 0947 10/23/17 0424 10/24/17 0334  WBC 6.3 6.3 5.8  RBC 4.98 4.59 4.55  HGB 13.6 12.6* 12.2*  HCT 39.5 36.5* 35.9*  MCV 79.3 79.5 78.9  MCH 27.3 27.5 26.8  MCHC 34.4 34.5 34.0  RDW 14.9 14.7 14.7  PLT 179 177 176    Cardiac Enzymes Recent Labs  Lab 10/22/17 0219 10/22/17 0947  TROPONINI <0.03 <0.03  Recent Labs  Lab 10/21/17 1600  TROPIPOC 0.00     BNP Recent Labs  Lab 10/21/17 1935  BNP 134.6*     DDimer No results for input(s): DDIMER in the last 168 hours.   Radiology    No results found.  Cardiac Studies   Echocardiogram pending   CORONARY STENT INTERVENTION  RIGHT HEART CATH AND CORONARY/GRAFT ANGIOGRAPHY 10/23/17  Conclusion    Ost RCA lesion is 100% stenosed.  Origin lesion is 99% stenosed, SVG to RCA graft. A drug-eluting stent was successfully placed using a STENT RESOLUTE ONYX 5.0X15, postdilated to 5.5 mm.  Post intervention, there is a 10% residual stenosis.  Prox Cx to Mid Cx lesion is 25% stenosed.  Mid LAD lesion is 25% stenosed.  Hemodynamic findings consistent with mild pulmonary hypertension.  Difficult to engage the SVG to RCA with a guide catheter. Had to place guide near the prior stent and was fortunate to get wire into the vessel, which allowed the guide to be pulled in.  A drug-eluting stent was  successfully placed using a STENT RESOLUTE ONYX 5.0X15.   Restart heparin 6 hours post sheath pull.   Recommend to resume Warfarin, at currently prescribed dose and frequency, today, 10/23/2017.  Recommend antiplatelet therapy of Clopidogrel 75mg  QD for 6-12 months.  Aspirin for 30 days.   Brilinta given during the procedure but would change to Plavix once he is therapeutic on his Coumadin.  Would give a 300 mg loading dose, followed by 75 mg daily.    Diagnostic Diagram       Post-Intervention Diagram          Patient Profile     52 y.o. male  is a 52 year old gentleman with CAD s/p remote CABG in 2000 and PCI in 2015, aortic dissection s/p bentall with mechanical AVR in 2000, Atrial fibrillation, chronic anticoagulation w/ coumadin, DM, HTN and HL who presented due to shortness of breath. Troponins negative.   Assessment & Plan    Exertional dyspnea -LHC yesterday revealing 99% stenosis at origin of the SVG to RCA with native occlusion of the RCA. A drug-eluting stent was successfully placed. -The patient was loaded with Brilinta. The plan is to switch to Plavix once INR is therapeutic, continue warfarin and use aspirin 81 mg for 30 days and then discontinue aspirin.  -He is continued on heparin drip until therapeutic INR.  -Exertional dyspnea is much improved, although pt reports shortness of breath related to Brilinta.   CAD -Hx CABG -LHC as above.  -Plan as above with triple therapy for 30 days then stop aspirin -No chest discomfort today, improvement in exertional dsypnea but shortness fo breath related to Brilinta, better when taken with caffeinated beverage. Should resolve with switch to Plavix once INR therapeutic.  Mechanical aortic valve.  - INR was subtherapeutic on admit at 1.8. Pharmacy following to help with heparin bridge and coumadin dosing.  -INR 1.76 today -In speaking with pt, he has no experience with home lovenox. With his prior subtherapeutic INR  and limited capacity for home lovenox use, would keep until INR therapeutic. Hopefully tomorrow.  -Echo is pending. Pt just went down for.  History of aortic dissection -s/p Bentall procedure with mechanical aortic valve.   Atrial flutter  -He is rate controlled. Has been in the 90's. This am up to low 100's with activity.  -Not on home Toprol XL 200 mg daily, pt not available to inquire about his not taking it. He needs  to resume this for rate control and BP.   Hyperlipidemia -LDL elevated at 87 mg/dL. Goal in the setting of CAD is < 70 mg/dL. Has been on Pravastatin 40 mg. Previously on simvastatin. -Now switched to atorvastatin 40 mg to try to achieve goal. Repeat FLP and HFTs in 8 weeks.   Hypertension -BP elevated this am.  -continue losartan and resume his home Toprol XL 200 mg  For questions or updates, please contact [place] Please consult [email or web address] for contact info under Cardiology/STEMI.      Signed, Daune Perch, NP  10/24/2017, 7:54 AM

## 2017-10-24 NOTE — Progress Notes (Signed)
#   1. TIFFANY  @ [employer] # [phone]    BRILINTA  90 MG BID  COVER- YES  CO-PAY - $ 8.50  TIER- 3 DRUG  PRIOR APPROVAL- NO   PREFERRED PHARMACY : YES [facility] AND [facility]

## 2017-10-24 NOTE — Progress Notes (Signed)
[place] for heparin Indication: Afib and AVR  Allergies  Allergen Reactions  . Insulin Glargine Swelling    edema   Patient Measurements: Height: 6\' 2"  (188 cm) Weight: 293 lb 3.4 oz (133 kg) IBW/kg (Calculated) : 82.2 Heparin Dosing Weight: 115kg  Vital Signs: Temp: 97.5 F (36.4 C) (06/04 1159) Temp Source: Oral (06/04 1159) BP: 124/94 (06/04 1159) Pulse Rate: 90 (06/04 1159)  Labs: Recent Labs    10/21/17 1520  10/22/17 0219  10/22/17 0947 10/23/17 0424  10/24/17 0334 10/24/17 0939 10/24/17 1134  HGB 13.3  --  12.9*  --  13.6 12.6*  --  12.2*  --   --   HCT 38.6*  --  38.1*  --  39.5 36.5*  --  35.9*  --   --   PLT 206  --  186  --  179 177  --  176  --   --   LABPROT  --    < >  --   --  20.2* 19.4*  --  20.3*  --   --   INR  --    < >  --   --  1.74 1.65  --  1.76  --   --   HEPARINUNFRC  --   --   --    < > 0.62 0.75*   < > 0.21* 0.22* 0.23*  CREATININE 1.13  --  1.15  --   --   --   --  1.12  --   --   TROPONINI  --   --  <0.03  --  <0.03  --   --   --   --   --    < > = values in this interval not displayed.   Estimated Creatinine Clearance: 111.9 mL/min (by C-G formula based on SCr of 1.12 mg/dL).  Medical History: Past Medical History:  Diagnosis Date  . Ascending aortic dissection ([place])    a. 2000 - with aortic valve involvement. S/p repair of aortic dissection with placement of mechanical AVR.  [name] CAD (coronary artery disease)    a. 2000 VG->PDA @ time of Ao dissection repair;  b. Cardiac CT 06/2010: no CAD, only mild plaque in prox RCA;  c. 2015 Cath: LM nl, LAD nl, LCX nl, RCA 100, VG->PDA 90 (4.5x12 Rebel BMS).  . Cholelithiasis 08/22/2013  . Chronic diastolic CHF (congestive heart failure) ([place])    a. 03/2016 Echo: EF 55-60%, no rwma, Gr2 DD.  . Diabetes mellitus   . Dyspnea   . Gross hematuria   . H/O mechanical aortic valve replacement    a. 03/2016 Echo: AoV mean gradient 27mmHg, Valve Area (VTI) 1.36  cm^2, (Vmax) 1.22 cm^2, mod dil LA.  [name] Hyperlipidemia   . Hypertension   . NEPHROLITHIASIS, HX OF   . Obesity   . PAF (paroxysmal atrial fibrillation) ([place])    a. H/o such, with recurrence of coarse afib/flutter during ER consult 03/2012.  [name] TRANSIENT ISCHEMIC ATTACK, HX OF    a. In 2004.  [name] Unspecified vitamin D deficiency    [name], on Coumadin 5mg  daily exc for 7.5mg  on MWF PTA for Afib. Subtherapeutic INR at 1.76 this morning (goal 2-3 per [facility] clinic notes) w/ known Afib and h/o AVR, on heparin bridge.   Heparin level subtherapeutic after resuming post cath 0.23; previously therapeutic on 1700 units/hr. No overt bleeding or issues with infusion. Patient to  remain inpatient until INR is therapeutic. Warfarin is being managed by the medical team.   Goal of Therapy:  Heparin level 0.3-0.7 units/ml Monitor platelets by anticoagulation protocol: Yes   Plan:  Increase heparin to 1800 units/hr Monitor daily heparin level, CBC, s/s of bleed MD dosing Coumadin  Georga Bora, PharmD Clinical Pharmacist 10/24/2017 12:59 PM

## 2017-10-24 NOTE — Progress Notes (Signed)
[place] for heparin Indication: Afib and AVR  Allergies  Allergen Reactions  . Insulin Glargine Swelling    edema   Patient Measurements: Height: 6\' 2"  (188 cm) Weight: 293 lb 3.4 oz (133 kg) IBW/kg (Calculated) : 82.2 Heparin Dosing Weight: 115kg  Vital Signs: Temp: 98.1 F (36.7 C) (06/04 1932) Temp Source: Oral (06/04 1932) BP: 121/91 (06/04 1932) Pulse Rate: 88 (06/04 1932)  Labs: Recent Labs    10/22/17 0219  10/22/17 0947 10/23/17 0424  10/24/17 0334 10/24/17 0939 10/24/17 1134 10/24/17 2049  HGB 12.9*  --  13.6 12.6*  --  12.2*  --   --   --   HCT 38.1*  --  39.5 36.5*  --  35.9*  --   --   --   PLT 186  --  179 177  --  176  --   --   --   LABPROT  --   --  20.2* 19.4*  --  20.3*  --   --   --   INR  --   --  1.74 1.65  --  1.76  --   --   --   HEPARINUNFRC  --    < > 0.62 0.75*   < > 0.21* 0.22* 0.23* 0.44  CREATININE 1.15  --   --   --   --  1.12  --   --   --   TROPONINI <0.03  --  <0.03  --   --   --   --   --   --    < > = values in this interval not displayed.   Estimated Creatinine Clearance: 111.9 mL/min (by C-G formula based on SCr of 1.12 mg/dL).  Medical History: Past Medical History:  Diagnosis Date  . Ascending aortic dissection ([place])    a. 2000 - with aortic valve involvement. S/p repair of aortic dissection with placement of mechanical AVR.  [name] CAD (coronary artery disease)    a. 2000 VG->PDA @ time of Ao dissection repair;  b. Cardiac CT 06/2010: no CAD, only mild plaque in prox RCA;  c. 2015 Cath: LM nl, LAD nl, LCX nl, RCA 100, VG->PDA 90 (4.5x12 Rebel BMS).  . Cholelithiasis 08/22/2013  . Chronic diastolic CHF (congestive heart failure) ([place])    a. 03/2016 Echo: EF 55-60%, no rwma, Gr2 DD.  . Diabetes mellitus   . Dyspnea   . Gross hematuria   . H/O mechanical aortic valve replacement    a. 03/2016 Echo: AoV mean gradient 71mmHg, Valve Area (VTI) 1.36 cm^2, (Vmax) 1.22 cm^2, mod dil LA.  [name]  Hyperlipidemia   . Hypertension   . NEPHROLITHIASIS, HX OF   . Obesity   . PAF (paroxysmal atrial fibrillation) ([place])    a. H/o such, with recurrence of coarse afib/flutter during ER consult 03/2012.  [name] TRANSIENT ISCHEMIC ATTACK, HX OF    a. In 2004.  [name] Unspecified vitamin D deficiency    [name], on Coumadin 5mg  daily exc for 7.5mg  on MWF PTA for Afib. Subtherapeutic INR at 1.76 this morning (goal 2-3 per [facility] clinic notes) w/ known Afib and h/o AVR, on heparin bridge.   Patient to remain inpatient until INR is therapeutic. Warfarin is being managed by the medical team.   Heparin level now at goal. No overt bleeding or issues with infusion  Goal of Therapy:  Heparin level 0.3-0.7 units/ml Monitor platelets by anticoagulation  protocol: Yes   Plan:  Continue IV heparin at current rate. Confirm with AM labs Monitor daily heparin level, CBC, s/s of bleed MD dosing Coumadin  [place] Crane, Roylene Reason, [facility] Clinical Pharmacist Pager [phone]  10/24/2017 10:18 PM

## 2017-10-25 LAB — PROTIME-INR
INR: 1.94
Prothrombin Time: 22 seconds — ABNORMAL HIGH (ref 11.4–15.2)

## 2017-10-25 LAB — CBC
HCT: 36.1 % — ABNORMAL LOW (ref 39.0–52.0)
HEMOGLOBIN: 12.4 g/dL — AB (ref 13.0–17.0)
MCH: 26.8 pg (ref 26.0–34.0)
MCHC: 34.3 g/dL (ref 30.0–36.0)
MCV: 78 fL (ref 78.0–100.0)
Platelets: 155 10*3/uL (ref 150–400)
RBC: 4.63 MIL/uL (ref 4.22–5.81)
RDW: 14.7 % (ref 11.5–15.5)
WBC: 5.5 10*3/uL (ref 4.0–10.5)

## 2017-10-25 LAB — HEPARIN LEVEL (UNFRACTIONATED): Heparin Unfractionated: 0.4 IU/mL (ref 0.30–0.70)

## 2017-10-25 LAB — GLUCOSE, CAPILLARY
GLUCOSE-CAPILLARY: 192 mg/dL — AB (ref 65–99)
Glucose-Capillary: 172 mg/dL — ABNORMAL HIGH (ref 65–99)
Glucose-Capillary: 163 mg/dL — ABNORMAL HIGH (ref 65–99)
Glucose-Capillary: 197 mg/dL — ABNORMAL HIGH (ref 65–99)

## 2017-10-25 MED ORDER — CLOPIDOGREL BISULFATE 75 MG PO TABS
300.0000 mg | ORAL_TABLET | Freq: Once | ORAL | Status: AC
  Administered 2017-10-25: 300 mg via ORAL
  Filled 2017-10-25: qty 4

## 2017-10-25 MED ORDER — CLOPIDOGREL BISULFATE 75 MG PO TABS
75.0000 mg | ORAL_TABLET | Freq: Every day | ORAL | Status: DC
  Administered 2017-10-26: 75 mg via ORAL
  Filled 2017-10-25: qty 1

## 2017-10-25 MED FILL — Nitroglycerin IV Soln 100 MCG/ML in D5W: INTRA_ARTERIAL | Qty: 10 | Status: AC

## 2017-10-25 MED FILL — Heparin Sod (Porcine)-NaCl IV Soln 1000 Unit/500ML-0.9%: INTRAVENOUS | Qty: 1000 | Status: AC

## 2017-10-25 NOTE — Progress Notes (Signed)
Progress Note  Patient Name: [name] Date of Encounter: 10/25/2017  Primary Cardiologist: Kirk Ruths, MD   Subjective   Feeling very short of breath after taking ticagrelor despite caffeine. Otherwise well.   Inpatient Medications    Scheduled Meds: . amLODipine  5 mg Oral Daily  . aspirin  81 mg Oral Daily  . atorvastatin  40 mg Oral q1800  . cholecalciferol  1,000 Units Oral Daily  . cloNIDine  0.1 mg Oral Daily  . clopidogrel  300 mg Oral Once  . [START ON 10/26/2017] clopidogrel  75 mg Oral Daily  . furosemide  40 mg Oral BID  . levothyroxine  50 mcg Oral QAC breakfast  . losartan  100 mg Oral Daily  . metoprolol succinate  200 mg Oral Daily  . sodium chloride flush  3 mL Intravenous Q12H  . warfarin  5 mg Oral Q T,Th,S,Su-1800  . warfarin  7.5 mg Oral Q M,W,F-1800  . Warfarin - Physician Dosing Inpatient   Does not apply q1800   Continuous Infusions: . sodium chloride 10 mL/hr at 10/24/17 1640  . heparin 1,800 Units/hr (10/25/17 0200)   PRN Meds: sodium chloride, acetaminophen, docusate sodium, nitroGLYCERIN, ondansetron (ZOFRAN) IV, sodium chloride flush   Vital Signs    Vitals:   10/24/17 1649 10/24/17 1932 10/25/17 0600 10/25/17 0801  BP: (!) 125/95 (!) 121/91 (!) 142/96 129/84  Pulse: (!) 103 88 90 86  Resp: (!) 21 20 18 19   Temp: 97.8 F (36.6 C) 98.1 F (36.7 C) (!) 97.5 F (36.4 C) 98.1 F (36.7 C)  TempSrc: Oral Oral Oral   SpO2: 99% 97% 99% 99%  Weight:   295 lb 8 oz (134 kg)   Height:        Intake/Output Summary (Last 24 hours) at 10/25/2017 1005 Last data filed at 10/25/2017 0900 Gross per 24 hour  Intake 840 ml  Output 800 ml  Net 40 ml   Filed Weights   10/23/17 0436 10/24/17 0400 10/25/17 0600  Weight: 296 lb 8 oz (134.5 kg) 293 lb 3.4 oz (133 kg) 295 lb 8 oz (134 kg)    Telemetry    Atrial flutterl.  Rate <100 bpm.  - Personally Reviewed  ECG    n/a - Personally Reviewed  Physical Exam   VS:  BP 129/84 (BP  Location: Left Arm)   Pulse 86   Temp 98.1 F (36.7 C)   Resp 19   Ht 6\' 2"  (1.88 m)   Wt 295 lb 8 oz (134 kg)   SpO2 99%   BMI 37.94 kg/m  , BMI Body mass index is 37.94 kg/m. GENERAL:  Well appearing HEENT: Pupils equal round and reactive, fundi not visualized, oral mucosa unremarkable NECK:  No jugular venous distention, waveform within normal limits, carotid upstroke brisk and symmetric, no bruits LUNGS:  Clear to auscultation bilaterally HEART:  Irregularly irregular.  PMI not displaced or sustained,S1 within normal limits, Mechanical S2.  no S3, no S4, no clicks, no rubs, II/VI systolic murmur at LUSB.   ABD:  Flat, positive bowel sounds normal in frequency in pitch, no bruits, no rebound, no guarding, no midline pulsatile mass, no hepatomegaly, no splenomegaly EXT:  2 plus pulses throughout, no edema, no cyanosis no clubbing SKIN:  No rashes no nodules NEURO:  Cranial nerves II through XII grossly intact, motor grossly intact throughout PSYCH:  Cognitively intact, oriented to person place and time   [employer]  Lab 10/21/17 1520 10/22/17 0219 10/24/17 0334  NA 141  --  138  K 3.4*  --  3.6  CL 104  --  105  CO2 27  --  23  GLUCOSE 131*  --  157*  BUN 10  --  13  CREATININE 1.13 1.15 1.12  CALCIUM 9.0  --  8.5*  GFRNONAA >60 >60 >60  GFRAA >60 >60 >60  ANIONGAP 10  --  10     Hematology Recent Labs  Lab 10/23/17 0424 10/24/17 0334 10/25/17 0223  WBC 6.3 5.8 5.5  RBC 4.59 4.55 4.63  HGB 12.6* 12.2* 12.4*  HCT 36.5* 35.9* 36.1*  MCV 79.5 78.9 78.0  MCH 27.5 26.8 26.8  MCHC 34.5 34.0 34.3  RDW 14.7 14.7 14.7  PLT 177 176 155    Cardiac Enzymes Recent Labs  Lab 10/22/17 0219 10/22/17 0947  TROPONINI <0.03 <0.03    Recent Labs  Lab 10/21/17 1600  TROPIPOC 0.00     BNP Recent Labs  Lab 10/21/17 1935  BNP 134.6*     DDimer No results for input(s): DDIMER in the last 168 hours.   Radiology    No results  found.  Cardiac Studies   CORONARY STENT INTERVENTION  RIGHT HEART CATH AND CORONARY/GRAFT ANGIOGRAPHY 10/23/17  Conclusion    Ost RCA lesion is 100% stenosed.  Origin lesion is 99% stenosed, SVG to RCA graft. A drug-eluting stent was successfully placed using a STENT RESOLUTE ONYX 5.0X15, postdilated to 5.5 mm.  Post intervention, there is a 10% residual stenosis.  Prox Cx to Mid Cx lesion is 25% stenosed.  Mid LAD lesion is 25% stenosed.  Hemodynamic findings consistent with mild pulmonary hypertension.  Difficult to engage the SVG to RCA with a guide catheter. Had to place guide near the prior stent and was fortunate to get wire into the vessel, which allowed the guide to be pulled in.  A drug-eluting stent was successfully placed using a STENT RESOLUTE ONYX 5.0X15.  Restart heparin 6 hours post sheath pull.   Recommend to resume Warfarin, at currently prescribed dose and frequency, today, 10/23/2017.  Recommend antiplatelet therapy of Clopidogrel 75mg  QD for 6-12 months. Aspirin for 30 days.   Brilinta given during the procedure but would change to Plavix once he is therapeutic on his Coumadin. Would give a 300 mg loading dose, followed by 75 mg daily.     Echo 10/24/17: Study Conclusions  - Left ventricle: The cavity size was mildly dilated. There was   moderate concentric hypertrophy. Systolic function was normal.   The estimated ejection fraction was in the range of 55% to 60%.   There is akinesis of the basalinferolateral and inferior   myocardium. The study is not technically sufficient to allow   evaluation of LV diastolic function. - Aortic valve: A mechanical prosthesis was present. Mean gradient   (S): 21 mm Hg. Valve area (VTI): 1.84 cm^2. Valve area (Vmax):   1.83 cm^2. Valve area (Vmean): 1.92 cm^2. - Mitral valve: There was mild regurgitation. - Left atrium: The atrium was mildly dilated. - Pulmonic valve: There was trivial regurgitation. -  Pulmonary arteries: PA peak pressure: 40 mm Hg (S).  Impressions:  - Compared to prior echo, the mean AV gradient has increased from   19 to 74mmHg. The right ventricular systolic pressure was   increased consistent with mild pulmonary hypertension.  Patient Profile     Mr. Poche is a 3M with CAD s/p CABG and PCI,  s/p mechanical AVR, aortic dissection s/p Bentall, chronic atrial fibrillation/flutter, diabetes, hypertension and hyperlipidemia here with progressive dyspnea on exertion. He was found to have in-stent restenosis of the stent in his vein graft to the RCA.  Assessment & Plan    # CAD s/p CABG/PCI: # Unstable angina: He was found to have in-stent restenosis of the stent in his vein graft to the RCA.  Underwent successful DES placement.  He has significant shortness of breath on ticagrelor despite drinking caffeine.  We will switch to clopidogrel.  Load with 300 mg once today and start 75 mg daily tomorrow.  Continue aspirin and atorvastatin.  # Mechanical AVR:  # Aortic dissection s/p Bentall:  Valve stable on echo.   Awaiting therapeutic INR.  INR is up to 1.9 today.  Continue heparin and warfarin managed by pharmacy.  His INR is increasing appropriately.  # Hypertension: BP better controlled but labile.  Amlodipine was added this admission.  Continue losartan, clonidine, losartan, and metoprolol.  The goal is to get him off clonidine, though this may have to wait for the outpatient setting.   # Chronic atrial fib/flutter: Rates controlled on metoprolol.  INR nearly therapeutic. the   For questions or updates, please contact [place] Please consult [email or web address] for contact info under Cardiology/STEMI.      Signed, Skeet Latch, MD  10/25/2017, 10:05 AM

## 2017-10-25 NOTE — Progress Notes (Signed)
Patient expressed to me that he finds the side effects of Brilinta intolerable and wants an alternative.  States " I couldn't sleep all night and I was so anxious I couldn't stand it".  Will pass on to MD

## 2017-10-25 NOTE — Progress Notes (Signed)
Offered to walk with pt, pt states he will walk later as he is having some SOB with Brilinta. Pt currently refusing to go home on Brilinta, pt advised on its importance and that if he did not want to take it, he would need plavix to keep his stent patent. Pt verbalizes understanding of importance of need for anti-platelet. Pt continues to wait for therapeutic INR, will continue to follow as low priority.  [id number] Rufina Falco, RN BSN 10/25/2017 8:29 AM

## 2017-10-25 NOTE — Progress Notes (Signed)
[place] for heparin Indication: Afib and AVR  Allergies  Allergen Reactions  . Insulin Glargine Swelling    edema   Patient Measurements: Height: 6\' 2"  (188 cm) Weight: 295 lb 8 oz (134 kg) IBW/kg (Calculated) : 82.2 Heparin Dosing Weight: 115kg  Vital Signs: Temp: 98.1 F (36.7 C) (06/05 0801) Temp Source: Oral (06/05 0600) BP: 129/84 (06/05 0801) Pulse Rate: 86 (06/05 0801)  Labs: Recent Labs    10/23/17 0424  10/24/17 0334  10/24/17 1134 10/24/17 2049 10/25/17 0223  HGB 12.6*  --  12.2*  --   --   --  12.4*  HCT 36.5*  --  35.9*  --   --   --  36.1*  PLT 177  --  176  --   --   --  155  LABPROT 19.4*  --  20.3*  --   --   --  22.0*  INR 1.65  --  1.76  --   --   --  1.94  HEPARINUNFRC 0.75*   < > 0.21*   < > 0.23* 0.44 0.40  CREATININE  --   --  1.12  --   --   --   --    < > = values in this interval not displayed.   Estimated Creatinine Clearance: 112.3 mL/min (by C-G formula based on SCr of 1.12 mg/dL).  Medical History: Past Medical History:  Diagnosis Date  . Ascending aortic dissection ([place])    a. 2000 - with aortic valve involvement. S/p repair of aortic dissection with placement of mechanical AVR.  [name] CAD (coronary artery disease)    a. 2000 VG->PDA @ time of Ao dissection repair;  b. Cardiac CT 06/2010: no CAD, only mild plaque in prox RCA;  c. 2015 Cath: LM nl, LAD nl, LCX nl, RCA 100, VG->PDA 90 (4.5x12 Rebel BMS).  . Cholelithiasis 08/22/2013  . Chronic diastolic CHF (congestive heart failure) ([place])    a. 03/2016 Echo: EF 55-60%, no rwma, Gr2 DD.  . Diabetes mellitus   . Dyspnea   . Gross hematuria   . H/O mechanical aortic valve replacement    a. 03/2016 Echo: AoV mean gradient 37mmHg, Valve Area (VTI) 1.36 cm^2, (Vmax) 1.22 cm^2, mod dil LA.  [name] Hyperlipidemia   . Hypertension   . NEPHROLITHIASIS, HX OF   . Obesity   . PAF (paroxysmal atrial fibrillation) ([place])    a. H/o such, with recurrence of coarse  afib/flutter during ER consult 03/2012.  [name] TRANSIENT ISCHEMIC ATTACK, HX OF    a. In 2004.  [name] Unspecified vitamin D deficiency    [name], on Coumadin 5mg  daily exc for 7.5mg  on MWF PTA for Afib. Subtherapeutic INR at 1.94 this morning (goal 2-3 per [facility] clinic notes) w/ known Afib and h/o AVR, on heparin bridge.   Heparin level therapeutic: 0.40, CBC stable, no overt bleeding documented  Goal of Therapy:  Heparin level 0.3-0.7 units/ml Monitor platelets by anticoagulation protocol: Yes   Plan:  Continue heparin at 1800 units/hr Monitor daily heparin level, CBC, s/s of bleed MD dosing Coumadin  Georga Bora, PharmD Clinical Pharmacist 10/25/2017 9:50 AM

## 2017-10-26 ENCOUNTER — Encounter: Payer: Self-pay | Admitting: Cardiology

## 2017-10-26 ENCOUNTER — Telehealth: Payer: Self-pay | Admitting: Adult Health

## 2017-10-26 ENCOUNTER — Telehealth: Payer: Self-pay | Admitting: Internal Medicine

## 2017-10-26 LAB — CBC
HEMATOCRIT: 38.2 % — AB (ref 39.0–52.0)
Hemoglobin: 12.9 g/dL — ABNORMAL LOW (ref 13.0–17.0)
MCH: 27.2 pg (ref 26.0–34.0)
MCHC: 33.8 g/dL (ref 30.0–36.0)
MCV: 80.4 fL (ref 78.0–100.0)
PLATELETS: 147 10*3/uL — AB (ref 150–400)
RBC: 4.75 MIL/uL (ref 4.22–5.81)
RDW: 15.1 % (ref 11.5–15.5)
WBC: 6.2 10*3/uL (ref 4.0–10.5)

## 2017-10-26 LAB — GLUCOSE, CAPILLARY: Glucose-Capillary: 180 mg/dL — ABNORMAL HIGH (ref 65–99)

## 2017-10-26 LAB — HEPARIN LEVEL (UNFRACTIONATED): Heparin Unfractionated: 0.56 IU/mL (ref 0.30–0.70)

## 2017-10-26 LAB — PROTIME-INR
INR: 2.22
Prothrombin Time: 24.4 seconds — ABNORMAL HIGH (ref 11.4–15.2)

## 2017-10-26 MED ORDER — ATORVASTATIN CALCIUM 40 MG PO TABS: 40.0000 mg | ORAL_TABLET | Freq: Every day | ORAL | 3 refills | Status: DC

## 2017-10-26 MED ORDER — CLOPIDOGREL BISULFATE 75 MG PO TABS: 75.0000 mg | ORAL_TABLET | Freq: Every day | ORAL | 3 refills | Status: DC

## 2017-10-26 MED ORDER — ASPIRIN 81 MG PO CHEW: 81.0000 mg | CHEWABLE_TABLET | Freq: Every day | ORAL | 0 refills | Status: DC

## 2017-10-26 MED ORDER — AMLODIPINE BESYLATE 10 MG PO TABS: 10.0000 mg | ORAL_TABLET | Freq: Every day | ORAL | 3 refills | Status: DC

## 2017-10-26 MED ORDER — METOPROLOL SUCCINATE ER 200 MG PO TB24: 200.0000 mg | ORAL_TABLET | Freq: Every day | ORAL | 3 refills | Status: DC

## 2017-10-26 MED ORDER — AMLODIPINE BESYLATE 10 MG PO TABS: 10.0000 mg | ORAL_TABLET | Freq: Every day | ORAL | Status: DC

## 2017-10-26 NOTE — Telephone Encounter (Signed)
Per TCM report pt was admitted 10/21/17 for dyspnea on exertion . Consulted w/cardiology and had a procedure done (R) heart cath on 6/3. Pt was D/C 10/26/17, and was made hosp f/u w/cardiology for 11/09/17. Will also see coumadin clinic on 6/14. Does not need to see PCP per summary.Marland KitchenJohny [name]

## 2017-10-26 NOTE — Telephone Encounter (Signed)
New Message  [facility] appointment made on 11/09/17 at 9:30 with Jory Sims per Pecolia Ades

## 2017-10-26 NOTE — Consult Note (Signed)
            [facility] CM Primary Care Navigator  10/26/2017  [name] Jun 19, 1965 [id number]   Went to seepatient at the bedsideto identify possible discharge needs but he was alreadydischargedhome per staff report.   PerMD note,patient wasadmitted for further evaluation of progressive dyspnea on exertion. (unstable angina, coronary artery disease, chronic atrial fib/ flutter, acute on chronic HF)  Primary care provider's office is listed as providing transition of care (TOC) follow-up.  Patient has discharge instruction to follow-up with cardiology on 6/14 and 6/20.  Primary care provider's office called(Kristie for [name])to notify of patient's discharge andneed for post hospital follow-up and transition of care (TOC).Notified of health issues needing follow-up (mainly A-fib/ flutter, acute on chronic HF, diabetes).  Made aware to refer patient to [facility] CM if deemed necessary and appropriate for services.   For additional questions please contact:  Edwena Felty A. Japneet Staggs, BSN, RN-BC [facility] PRIMARY CARE Navigator Cell: [phone]

## 2017-10-26 NOTE — Discharge Summary (Signed)
Discharge Summary    Patient ID: [name],  MRN: [id number], DOB/AGE: 1966-04-21 52 y.o.  Admit date: 10/21/2017 Discharge date: 10/26/2017  Primary Care Provider: Biagio [name] Primary Cardiologist: Zachary Ruths, MD  Discharge Diagnoses    Principal Problem:   Dyspnea on exertion Active Problems:   Diabetes [facility])   Hyperlipidemia   Essential hypertension   PAF (paroxysmal atrial fibrillation) (HCC)   Acute diastolic heart failure (HCC)   Unstable angina (HCC)   Acute on chronic systolic congestive heart failure ([place])   Status post coronary artery stent placement   Chronic atrial fibrillation (HCC)   Allergies Allergies  Allergen Reactions  . Insulin Glargine Swelling    edema    Diagnostic Studies/Procedures     CORONARY STENT INTERVENTION  RIGHT HEART CATH AND CORONARY/GRAFT ANGIOGRAPHY6/3/19  Conclusion    Ost RCA lesion is 100% stenosed.  Origin lesion is 99% stenosed, SVG to RCA graft. A drug-eluting stent was successfully placed using a STENT RESOLUTE ONYX 5.0X15, postdilated to 5.5 mm.  Post intervention, there is a 10% residual stenosis.  Prox Cx to Mid Cx lesion is 25% stenosed.  Mid LAD lesion is 25% stenosed.  Hemodynamic findings consistent with mild pulmonary hypertension.  Difficult to engage the SVG to RCA with a guide catheter. Had to place guide near the prior stent and was fortunate to get wire into the vessel, which allowed the guide to be pulled in.  A drug-eluting stent was successfully placed using a STENT RESOLUTE ONYX 5.0X15.  Restart heparin 6 hours post sheath pull.   Recommend to resume Warfarin, at currently prescribed dose and frequency, today, 10/23/2017.  Recommend antiplatelet therapy of Clopidogrel 75mg  QD for 6-12 months. Aspirin for 30 days.   Brilinta given during the procedure but would change to Plavix once he is therapeutic on his Coumadin. Would give a 300 mg loading dose, followed by 75 mg  daily.    Diagnostic Diagram       Post-Intervention Diagram        Echo 10/24/17: Study Conclusions - Left ventricle: The cavity size was mildly dilated. There was moderate concentric hypertrophy. Systolic function was normal. The estimated ejection fraction was in the range of 55% to 60%. There is akinesis of the basalinferolateral and inferior myocardium. The study is not technically sufficient to allow evaluation of LV diastolic function. - Aortic valve: A mechanical prosthesis was present. Mean gradient (S): 21 mm Hg. Valve area (VTI): 1.84 cm^2. Valve area (Vmax): 1.83 cm^2. Valve area (Vmean): 1.92 cm^2. - Mitral valve: There was mild regurgitation. - Left atrium: The atrium was mildly dilated. - Pulmonic valve: There was trivial regurgitation. - Pulmonary arteries: PA peak pressure: 40 mm Hg (S).  Impressions: - Compared to prior echo, the mean AV gradient has increased from 19 to 63mmHg. The right ventricular systolic pressure was increased consistent with mild pulmonary hypertension.    History of Present Illness     [name] is a 52 year old gentleman with CAD s/p PCI in 2015, aortic dissection s/p bentall with mechanical AVR 2000, Chronic Atrial fibrillation/flutter, DM, HTN, HL who presented due to shortness of breath.   The patient reported that he presented to the ED approximately one month ago due to shortness of breath. At that time, he was felt to have CHF exacerbation and was given a dose of IV lasix in the ED with improvement and was discharged home. He reported that he initially did better but in  past week has had significant shortness of breath which limited exertion. He usually goes to the gym on a regular basis but on the day of presentation he was unable to even take the trash cans down the hill of his driveway without having to stop and rest. His symptoms were particularly bad on that day but  he had felt weak and dyspneic for  prior week. He denied chest pain, orthopnea, PND, LEE. He noted similar symptoms prior to PCI in 2015.  Given symptoms, he presented to the ED and was noted to be hemodynamically stable without acute ECG changes. Initial troponin zero, labs significant only for hypokalemia. He was in rate controlled afib. His INR was subtherapeutic at 1.8. He was feeling comfortable without CP or SOB. He was admitted for further evaluation.  Hospital Course     Consultants: none  Troponins remained negative. Due to his continued symptoms, [name] was taken to the cath lab on 10/23/17 and found to have in-stent restenosis of the stent in his vein graft to the RCA.  He underwent successful implantation of a drug eluding stent with 10% residual stenosis. Recommended antiplatelet therapy of Clopidogrel 75mg  QD for 6-12 months and aspirin for 30 days. His warfarin was resumed.   His blood pressure was elevated despite being on his home antihypertensives. Amlodipine was added this admission. Continue losartan and metoprolol. His blood pressure improved and we were able to discontinue Clonidine.  Pravastatin was switched to atorvastatin this admission.  He has remained in rate controlled atrial flutter. Upon admission his INR was subtherapeutic. After cath he was given IV heparin and warfarin resumed per his home dosing. Today his INR is therapeutic at 2.22. He has an appointment with coumadin clinic on 6/14.  Patient has been seen by [name] today and deemed ready for discharge home. All follow up appointments have been scheduled. Discharge medications are listed below.  _____________  Discharge Vitals Blood pressure (!) 134/94, pulse 83, temperature 98.4 F (36.9 C), temperature source Oral, resp. rate 20, height 6\' 2"  (1.88 m), weight 296 lb 8.3 oz (134.5 kg), SpO2 96 %.  Filed Weights   10/24/17 0400 10/25/17 0600 10/26/17 0641  Weight: 293 lb 3.4 oz (133 kg) 295 lb 8 oz (134 kg) 296 lb 8.3 oz (134.5 kg)    Physical Exam  Constitutional: He is oriented to person, place, and time. He appears well-developed and well-nourished. No distress.  HENT:  Head: Normocephalic and atraumatic.  Neck: Normal range of motion. Neck supple. No JVD present.  Cardiovascular: Normal rate, regular rhythm, normal heart sounds and intact distal pulses. Exam reveals no gallop and no friction rub.  No murmur heard. Pulmonary/Chest: Effort normal and breath sounds normal. No respiratory distress. He has no wheezes. He has no rales.  Abdominal: Soft. Bowel sounds are normal.  Musculoskeletal: Normal range of motion. He exhibits no edema or deformity.  Neurological: He is alert and oriented to person, place, and time.  Skin: Skin is dry.  Psychiatric: He has a normal mood and affect. His behavior is normal. Thought content normal.     Labs & Radiologic Studies    CBC Recent Labs    10/25/17 0223 10/26/17 0342  WBC 5.5 6.2  HGB 12.4* 12.9*  HCT 36.1* 38.2*  MCV 78.0 80.4  PLT 155 938*   Basic Metabolic Panel Recent Labs    10/24/17 0334  NA 138  K 3.6  CL 105  CO2 23  GLUCOSE 157*  BUN  13  CREATININE 1.12  CALCIUM 8.5*   Liver Function Tests No results for input(s): AST, ALT, ALKPHOS, BILITOT, PROT, ALBUMIN in the last 72 hours. No results for input(s): LIPASE, AMYLASE in the last 72 hours. Cardiac Enzymes No results for input(s): CKTOTAL, CKMB, CKMBINDEX, TROPONINI in the last 72 hours. BNP Invalid input(s): POCBNP D-Dimer No results for input(s): DDIMER in the last 72 hours. Hemoglobin A1C No results for input(s): HGBA1C in the last 72 hours. Fasting Lipid Panel No results for input(s): CHOL, HDL, LDLCALC, TRIG, CHOLHDL, LDLDIRECT in the last 72 hours. Thyroid Function Tests No results for input(s): TSH, T4TOTAL, T3FREE, THYROIDAB in the last 72 hours.  Invalid input(s): FREET3 _____________  Dg Chest 2 View  Result Date: 10/21/2017 CLINICAL DATA:  Shortness of Breath EXAM: CHEST -  2 VIEW COMPARISON:  08/26/2017 FINDINGS: Cardiac shadow is mildly enlarged but stable postsurgical changes are again seen. The lungs are well aerated bilaterally. Mild vascular congestion is again seen without focal interstitial edema or sizable effusion. No focal infiltrate is noted. No bony abnormality is seen. IMPRESSION: Mild vascular congestion stable in appearance from the prior exam. No acute abnormality is noted. Electronically Signed   By: Inez Catalina M.D.   On: 10/21/2017 16:08   Disposition   Pt is being discharged home today in good condition.  Follow-up Plans & Appointments    Follow-up Information    Lendon Colonel, NP Follow up.   Specialties:  Nurse Practitioner, Radiology, Cardiology Why:  Cardiology hospital follow up on June 20th at 9:30. Please arrive 15 minutes early for check in.  Contact information: [address] [place] [id number] [place] [facility] up.   Specialty:  Cardiology Why:  Coumadin clinic on 11/03/17 at 8:30 as previously scheduled.  Contact information: [address], [place] [phone]         Discharge Instructions    AMB Referral to Cardiac Rehabilitation - Phase II   Complete by:  As directed    Diagnosis:  Coronary Stents   Amb Referral to Cardiac Rehabilitation   Complete by:  As directed    Diagnosis:  Coronary Stents   Diet - low sodium heart healthy   Complete by:  As directed    Discharge instructions   Complete by:  As directed    PLEASE REMEMBER TO BRING ALL OF YOUR MEDICATIONS TO EACH OF YOUR FOLLOW-UP OFFICE VISITS.  PLEASE ATTEND ALL SCHEDULED FOLLOW-UP APPOINTMENTS.   Activity: Increase activity slowly as tolerated. You may shower, but no soaking baths (or swimming) for 1 week. No driving for 1 week. No lifting over 10 lbs for 2 weeks. No sexual activity for 2 weeks.   You May Return to Work: in 1 week (if  applicable)  Wound Care: You may wash cath site gently with soap and water. Keep cath site clean and dry. If you notice pain, swelling, bleeding or pus at your cath site, please call [phone].   Increase activity slowly   Complete by:  As directed       Discharge Medications   Allergies as of 10/26/2017      Reactions   Insulin Glargine Swelling   edema      Medication List    STOP taking these medications   cloNIDine 0.1 MG tablet Commonly known as:  CATAPRES   pravastatin 40 MG tablet Commonly known as:  PRAVACHOL     TAKE these medications   amLODipine 10 MG tablet Commonly known as:  NORVASC Take 1 tablet (10 mg total) by mouth daily. Start taking on:  10/27/2017   aspirin 81 MG chewable tablet Chew 1 tablet (81 mg total) by mouth daily. Take for 30 days only.   atorvastatin 40 MG tablet Commonly known as:  LIPITOR Take 1 tablet (40 mg total) by mouth daily at 6 PM.   cholecalciferol 1000 units tablet Commonly known as:  VITAMIN D Take 1,000 Units by mouth daily.   clopidogrel 75 MG tablet Commonly known as:  PLAVIX Take 1 tablet (75 mg total) by mouth daily.   furosemide 40 MG tablet Commonly known as:  LASIX Take 1 tablet (40 mg total) by mouth 2 (two) times daily. What changed:    when to take this  additional instructions   glimepiride 1 MG tablet Commonly known as:  AMARYL TAKE 3 TABLETS ONE TIME DAILY WITH BREAKFAST What changed:  See the new instructions.   levothyroxine 50 MCG tablet Commonly known as:  SYNTHROID, LEVOTHROID TAKE 1 TABLET EVERY DAY   losartan 100 MG tablet Commonly known as:  COZAAR TAKE 1 TABLET EVERY DAY   metFORMIN 1000 MG tablet Commonly known as:  GLUCOPHAGE TAKE 1 TABLET TWICE DAILY WITH MEALS   metoprolol 200 MG 24 hr tablet Commonly known as:  TOPROL-XL Take 1 tablet (200 mg total) by mouth daily. Take with or immediately following a meal. What changed:  additional instructions   nitroGLYCERIN 0.4 MG SL  tablet Commonly known as:  NITROSTAT Place 1 tablet (0.4 mg total) under the tongue every 5 (five) minutes x 3 doses as needed for chest pain.   Potassium Chloride ER 20 MEQ Tbcr TAKE 2 TABLETS EVERY DAY What changed:    how much to take  how to take this  when to take this   sitaGLIPtin 100 MG tablet Commonly known as:  JANUVIA Take 1 tablet (100 mg total) by mouth daily.   Vitamin D (Ergocalciferol) 50000 units Caps capsule Commonly known as:  DRISDOL Take 1 capsule (50,000 Units total) by mouth every 7 (seven) days.   warfarin 5 MG tablet Commonly known as:  COUMADIN Take as directed. If you are unsure how to take this medication, talk to your nurse or doctor. Original instructions:  TAKE AS DIRECTED BY COUMADIN CLINIC What changed:  See the new instructions.         Outstanding Labs/Studies   INR with coumadin clinic  Duration of Discharge Encounter   Greater than 30 minutes including physician time.  Signed, Luna Fuse, NP 10/26/2017, 10:58 AM

## 2017-10-26 NOTE — Telephone Encounter (Signed)
Called pt no answer LMOM RTC.../lmb 

## 2017-10-26 NOTE — Progress Notes (Signed)
Progress Note  Patient Name: [name] Date of Encounter: 10/26/2017  Primary Cardiologist: Kirk Ruths, MD   Subjective   Feeling well.  No recurrent shortness of breath.  No chest pain with ambulation.  Inpatient Medications    Scheduled Meds: . amLODipine  5 mg Oral Daily  . aspirin  81 mg Oral Daily  . atorvastatin  40 mg Oral q1800  . cholecalciferol  1,000 Units Oral Daily  . clopidogrel  75 mg Oral Daily  . furosemide  40 mg Oral BID  . levothyroxine  50 mcg Oral QAC breakfast  . losartan  100 mg Oral Daily  . metoprolol succinate  200 mg Oral Daily  . sodium chloride flush  3 mL Intravenous Q12H  . warfarin  5 mg Oral Q T,Th,S,Su-1800  . warfarin  7.5 mg Oral Q M,W,F-1800  . Warfarin - Physician Dosing Inpatient   Does not apply q1800   Continuous Infusions: . sodium chloride 10 mL/hr at 10/25/17 0700   PRN Meds: sodium chloride, acetaminophen, docusate sodium, nitroGLYCERIN, ondansetron (ZOFRAN) IV, sodium chloride flush   Vital Signs    Vitals:   10/25/17 1600 10/25/17 1930 10/26/17 0641 10/26/17 0756  BP: 105/84 109/81 123/87 (!) 134/94  Pulse: 85 84 82 83  Resp: (!) 80 16 15 20   Temp: 98.2 F (36.8 C) 98.2 F (36.8 C) 98.3 F (36.8 C) 98.4 F (36.9 C)  TempSrc: Oral Oral Oral Oral  SpO2: 100% 97% 95% 96%  Weight:   296 lb 8.3 oz (134.5 kg)   Height:        Intake/Output Summary (Last 24 hours) at 10/26/2017 1046 Last data filed at 10/26/2017 0755 Gross per 24 hour  Intake 1360 ml  Output -  Net 1360 ml   Filed Weights   10/24/17 0400 10/25/17 0600 10/26/17 0641  Weight: 293 lb 3.4 oz (133 kg) 295 lb 8 oz (134 kg) 296 lb 8.3 oz (134.5 kg)    Telemetry    Atrial flutter.  Rate <100 bpm.  - Personally Reviewed  ECG    n/a - Personally Reviewed  Physical Exam   VS:  BP (!) 134/94 (BP Location: Left Arm)   Pulse 83   Temp 98.4 F (36.9 C) (Oral)   Resp 20   Ht 6\' 2"  (1.88 m)   Wt 296 lb 8.3 oz (134.5 kg)   SpO2 96%   BMI  38.07 kg/m  , BMI Body mass index is 38.07 kg/m. GENERAL:  Well appearing HEENT: Pupils equal round and reactive, fundi not visualized, oral mucosa unremarkable NECK:  No jugular venous distention, waveform within normal limits, carotid upstroke brisk and symmetric, no bruits LUNGS:  Clear to auscultation bilaterally HEART:  Irregularly irregular.  PMI not displaced or sustained,S1 within normal limits, Mechanical S2.  no S3, no S4, no clicks, no rubs, II/VI systolic murmur at LUSB.   ABD:  Flat, positive bowel sounds normal in frequency in pitch, no bruits, no rebound, no guarding, no midline pulsatile mass, no hepatomegaly, no splenomegaly EXT:  2 plus pulses throughout, no edema, no cyanosis no clubbing SKIN:  No rashes no nodules NEURO:  Cranial nerves II through XII grossly intact, motor grossly intact throughout PSYCH:  Cognitively intact, oriented to person place and time   Labs    Chemistry Recent Labs  Lab 10/21/17 1520 10/22/17 0219 10/24/17 0334  NA 141  --  138  K 3.4*  --  3.6  CL 104  --  105  CO2 27  --  23  GLUCOSE 131*  --  157*  BUN 10  --  13  CREATININE 1.13 1.15 1.12  CALCIUM 9.0  --  8.5*  GFRNONAA >60 >60 >60  GFRAA >60 >60 >60  ANIONGAP 10  --  10     Hematology Recent Labs  Lab 10/24/17 0334 10/25/17 0223 10/26/17 0342  WBC 5.8 5.5 6.2  RBC 4.55 4.63 4.75  HGB 12.2* 12.4* 12.9*  HCT 35.9* 36.1* 38.2*  MCV 78.9 78.0 80.4  MCH 26.8 26.8 27.2  MCHC 34.0 34.3 33.8  RDW 14.7 14.7 15.1  PLT 176 155 147*    Cardiac Enzymes Recent Labs  Lab 10/22/17 0219 10/22/17 0947  TROPONINI <0.03 <0.03    Recent Labs  Lab 10/21/17 1600  TROPIPOC 0.00     BNP Recent Labs  Lab 10/21/17 1935  BNP 134.6*     DDimer No results for input(s): DDIMER in the last 168 hours.   Radiology    No results found.  Cardiac Studies   CORONARY STENT INTERVENTION  RIGHT HEART CATH AND CORONARY/GRAFT ANGIOGRAPHY 10/23/17  Conclusion    Ost RCA  lesion is 100% stenosed.  Origin lesion is 99% stenosed, SVG to RCA graft. A drug-eluting stent was successfully placed using a STENT RESOLUTE ONYX 5.0X15, postdilated to 5.5 mm.  Post intervention, there is a 10% residual stenosis.  Prox Cx to Mid Cx lesion is 25% stenosed.  Mid LAD lesion is 25% stenosed.  Hemodynamic findings consistent with mild pulmonary hypertension.  Difficult to engage the SVG to RCA with a guide catheter. Had to place guide near the prior stent and was fortunate to get wire into the vessel, which allowed the guide to be pulled in.  A drug-eluting stent was successfully placed using a STENT RESOLUTE ONYX 5.0X15.  Restart heparin 6 hours post sheath pull.   Recommend to resume Warfarin, at currently prescribed dose and frequency, today, 10/23/2017.  Recommend antiplatelet therapy of Clopidogrel 75mg  QD for 6-12 months. Aspirin for 30 days.   Brilinta given during the procedure but would change to Plavix once he is therapeutic on his Coumadin. Would give a 300 mg loading dose, followed by 75 mg daily.     Echo 10/24/17: Study Conclusions  - Left ventricle: The cavity size was mildly dilated. There was   moderate concentric hypertrophy. Systolic function was normal.   The estimated ejection fraction was in the range of 55% to 60%.   There is akinesis of the basalinferolateral and inferior   myocardium. The study is not technically sufficient to allow   evaluation of LV diastolic function. - Aortic valve: A mechanical prosthesis was present. Mean gradient   (S): 21 mm Hg. Valve area (VTI): 1.84 cm^2. Valve area (Vmax):   1.83 cm^2. Valve area (Vmean): 1.92 cm^2. - Mitral valve: There was mild regurgitation. - Left atrium: The atrium was mildly dilated. - Pulmonic valve: There was trivial regurgitation. - Pulmonary arteries: PA peak pressure: 40 mm Hg (S).  Impressions:  - Compared to prior echo, the mean AV gradient has increased from   19 to  51mmHg. The right ventricular systolic pressure was   increased consistent with mild pulmonary hypertension.  Patient Profile     Mr. Guidice is a 40M with CAD s/p CABG and PCI, s/p mechanical AVR, aortic dissection s/p Bentall, chronic atrial fibrillation/flutter, diabetes, hypertension and hyperlipidemia here with progressive dyspnea on exertion. He was found to have in-stent restenosis  of the stent in his vein graft to the RCA.  Assessment & Plan    # CAD s/p CABG/PCI: # Unstable angina: He was found to have in-stent restenosis of the stent in his vein graft to the RCA.  Underwent successful DES placement.  He had significant shortness of breath on ticagrelor despite drinking caffeine.  He was switched to clopidogrel and his breathing has improved.  He will remain on clopidogrel for 6 to 12 months.  Continue aspirin, metoprolol, and atorvastatin.  # Mechanical AVR:  # Aortic dissection s/p Bentall:  Valve stable on echo.   INR was 2.2 today.  Given his atrial fibrillation and mechanical aortic valve is goal should be 2.5 to 3.5.  However given that he is also on clopidogrel a goal of 2-3 is reasonable for now.    # Hypertension: BP better controlled.  Amlodipine was added this admission.  His blood pressure still in the 130s over 90s.  Clonidine will be discontinued.  It was weaned from 0.1 twice daily to daily.  We will stop it today and increase amlodipine to 10 mg.  Continue metoprolol and losartan.  # Chronic atrial fib/flutter: Rates controlled on metoprolol.  INR therapeutic.    For questions or updates, please contact [place] Please consult [email or web address] for contact info under Cardiology/STEMI.      Signed, Skeet Latch, MD  10/26/2017, 10:46 AM

## 2017-10-26 NOTE — Telephone Encounter (Signed)
Copied from [place] [id number]. Topic: General - Other >> Oct 26, 2017 12:06 PM Margot Ables wrote: Reason for CRM: pt was just d/c from [facility] - pt will need a hospital f/u and transition of care call (prior) - pt has a-fib and a-flutter, dyspnea on exertion, acute on chronic heart failure, and diabetes (A1C 8.6 10/22/17) during hospital stay. If pt needs help at home with medication management please feel free to refer back to [facility].

## 2017-10-26 NOTE — Telephone Encounter (Signed)
[name] does these [place] calls come to you?

## 2017-10-27 NOTE — Telephone Encounter (Signed)
**Note De-Identified  Obfuscation** The pt is following up in our NL office. Will forward this message to NL triage to contact this TCM pt.

## 2017-10-27 NOTE — Telephone Encounter (Signed)
Patient contacted regarding discharge from [facility] on 10/26/17 .  Patient understands to follow up with provider Jory Sims on 11/09/17 at 9:30 AM at [facility]. Patient understands discharge instructions? Yes Patient understands medications and regiment? Yes Patient understands to bring all medications to this visit? Yes

## 2017-11-02 ENCOUNTER — Other Ambulatory Visit: Payer: Self-pay | Admitting: Internal Medicine

## 2017-11-02 MED ORDER — FUROSEMIDE 40 MG PO TABS: 40.0000 mg | ORAL_TABLET | Freq: Two times a day (BID) | ORAL | 1 refills | Status: DC

## 2017-11-02 NOTE — Telephone Encounter (Signed)
Copied from [place] [id number]. Topic: Quick Communication - Rx Refill/Question >> Nov 02, 2017 11:47 AM Antonieta Iba C wrote: Medication: furosemide (LASIX) 40 MG tablet  Has the patient contacted their pharmacy? No   (Agent: If no, request that the patient contact the pharmacy for the refill.)  (Agent: If yes, when and what did the pharmacy advise?)  Preferred Pharmacy (with phone number or street name): [facility] [phone] - [place], [place] [address] AT [address] [place] [phone] (Phone) [phone] (Fax)      Agent: Please be advised that RX refills may take up to 3 business days. We ask that you follow-up with your pharmacy.

## 2017-11-02 NOTE — Telephone Encounter (Signed)
Lasix refill. Prescribed by another provider previously. Last Refill:08/26/17 #60 Last OV: 08/31/17 PCP: Dr. Jenny Reichmann Pharmacy:[facility] [place]

## 2017-11-02 NOTE — Telephone Encounter (Signed)
Reviewed chart pt is up-to-date sent refills to [facility].../lmb  

## 2017-11-03 ENCOUNTER — Ambulatory Visit (INDEPENDENT_AMBULATORY_CARE_PROVIDER_SITE_OTHER): Payer: Medicare HMO | Admitting: Pharmacist

## 2017-11-03 DIAGNOSIS — I480 Paroxysmal atrial fibrillation: Secondary | ICD-10-CM

## 2017-11-03 DIAGNOSIS — Z8679 Personal history of other diseases of the circulatory system: Secondary | ICD-10-CM | POA: Diagnosis not present

## 2017-11-03 DIAGNOSIS — Z5181 Encounter for therapeutic drug level monitoring: Secondary | ICD-10-CM | POA: Diagnosis not present

## 2017-11-03 LAB — POCT INR: INR: 3.3 — AB (ref 2.0–3.0)

## 2017-11-03 NOTE — Patient Instructions (Signed)
Description   Take only 1/2 tablet today then Continue taking 1 tablet daily except 1.5 tablets only on Mondays,Wednesday, and Fridays. Recheck INR in 2 weeks.  Call us with any new medications or concerns [phone] [facility], Main [phone].

## 2017-11-08 NOTE — Progress Notes (Signed)
Cardiology Office Note   Date:  11/09/2017   ID:  [name], DOB 1966-01-05, MRN [id number]  PCP:  Biagio Borg, MD  Cardiologist:  [facility]   Chief Complaint  Patient presents with  . Hospitalization Follow-up    Cath, stents place, pt denied chest pain     History of Present Illness: [name] is a 52 y.o. male who presents for post hospitalization follow up with known history of CAD PCI to VG to PDA, in 2015, CABG with VG to PDA,  aortic dissection s/p repair s/p Bentall,  with mechanical AVR, chronic diastolic CHF, chronic  atrial fibrillation, hypertension hyperlipidemia, and Type II diabetes. He was recently admitted on 10/22/2017 with symptoms of shortness of breath.   He ultimately underwent right and left cardiac cath on 10/23/2017 requiring a  DES to the SVG to RCA graft with DES. He was recommended for Plavix and ASA for one year, and to stop ASA in 30 days. He was to resume coumadin therapy.    He complains of rapid HR and dyspnea since returning home. He states that he can hear his heart valve clearly and has noticed his HR is elevated based upon hearting the valve clicking. He is medially compliant. He offers no complaints of chest pressure, fatigue, or dizziness.   Past Medical History:  Diagnosis Date  . Ascending aortic dissection ([place])    a. 2000 - with aortic valve involvement. S/p repair of aortic dissection with placement of mechanical AVR.  [name] CAD (coronary artery disease)    a. 2000 VG->PDA @ time of Ao dissection repair;  b. Cardiac CT 06/2010: no CAD, only mild plaque in prox RCA;  c. 2015 Cath: LM nl, LAD nl, LCX nl, RCA 100, VG->PDA 90 (4.5x12 Rebel BMS). d. 10/23/17 instent restenosis SVG to RCA, DES placed.  . Cholelithiasis 08/22/2013  . Chronic diastolic CHF (congestive heart failure) ([place])    a. 03/2016 Echo: EF 55-60%, no rwma, Gr2 DD.  . Diabetes mellitus   . Dyspnea   . Gross hematuria   . H/O mechanical aortic valve replacement    a.  03/2016 Echo: AoV mean gradient 18mmHg, Valve Area (VTI) 1.36 cm^2, (Vmax) 1.22 cm^2, mod dil LA.  [name] Hyperlipidemia   . Hypertension   . NEPHROLITHIASIS, HX OF   . Obesity   . PAF (paroxysmal atrial fibrillation) ([place])    a. H/o such, with recurrence of coarse afib/flutter during ER consult 03/2012.  [name] TRANSIENT ISCHEMIC ATTACK, HX OF    a. In 2004.  [name] Unspecified vitamin D deficiency     Past Surgical History:  Procedure Laterality Date  . AORTIC VALVE REPLACEMENT  2000  . APPENDECTOMY  1998  . CORONARY ARTERY BYPASS GRAFT  2000  . CORONARY STENT INTERVENTION N/A 10/23/2017   Procedure: CORONARY STENT INTERVENTION;  Surgeon: [name], MD;  Location: [place] CV LAB;  Service: Cardiovascular;  Laterality: N/A;  . LEFT AND RIGHT HEART CATHETERIZATION WITH CORONARY ANGIOGRAM N/A 07/19/2013   Procedure: LEFT AND RIGHT HEART CATHETERIZATION WITH CORONARY ANGIOGRAM;  Surgeon: [name], MD;  Location: [facility] CATH LAB;  Service: Cardiovascular;  Laterality: N/A;  . RIGHT HEART CATH AND CORONARY/GRAFT ANGIOGRAPHY N/A 10/23/2017   Procedure: RIGHT HEART CATH AND CORONARY/GRAFT ANGIOGRAPHY;  Surgeon: [name], MD;  Location: [place] CV LAB;  Service: Cardiovascular;  Laterality: N/A;  . TEE WITHOUT CARDIOVERSION N/A 07/22/2013   Procedure: TRANSESOPHAGEAL ECHOCARDIOGRAM (TEE);  Surgeon: [name], MD;  Location: [facility] ENDOSCOPY;  Service: Cardiovascular;  Laterality: N/A;     Current Outpatient Medications  Medication Sig Dispense Refill  . amLODipine (NORVASC) 10 MG tablet Take 1 tablet (10 mg total) by mouth daily. 90 tablet 3  . aspirin 81 MG chewable tablet Chew 1 tablet (81 mg total) by mouth daily. Take for 30 days only. 30 tablet 0  . atorvastatin (LIPITOR) 40 MG tablet Take 1 tablet (40 mg total) by mouth daily at 6 PM. 90 tablet 3  . cholecalciferol (VITAMIN D) 1000 UNITS tablet Take 1,000 Units by mouth daily.    . clopidogrel (PLAVIX) 75 MG tablet  Take 1 tablet (75 mg total) by mouth daily. 90 tablet 3  . furosemide (LASIX) 40 MG tablet Take 1 tablet (40 mg total) by mouth 2 (two) times daily. 180 tablet 1  . glimepiride (AMARYL) 1 MG tablet TAKE 3 TABLETS ONE TIME DAILY WITH BREAKFAST (Patient taking differently: TAKE 1 TABLET (1 MG) BY MOUTH DAILY) 270 tablet 3  . levothyroxine (SYNTHROID, LEVOTHROID) 50 MCG tablet TAKE 1 TABLET EVERY DAY 90 tablet 3  . losartan (COZAAR) 100 MG tablet TAKE 1 TABLET EVERY DAY 90 tablet 3  . metFORMIN (GLUCOPHAGE) 1000 MG tablet TAKE 1 TABLET TWICE DAILY WITH MEALS 180 tablet 1  . metoprolol succinate (TOPROL-XL) 200 MG 24 hr tablet Take 1 tablet (200 mg total) by mouth daily. Take with or immediately following a meal. 90 tablet 3  . nitroGLYCERIN (NITROSTAT) 0.4 MG SL tablet Place 1 tablet (0.4 mg total) under the tongue every 5 (five) minutes x 3 doses as needed for chest pain. 25 tablet 2  . Potassium Chloride ER 20 MEQ TBCR TAKE 2 TABLETS EVERY DAY (Patient taking differently: TAKE 1 TABLET BY MOUTH TWICE DAILY - AM AND 5PM) 180 tablet 0  . sitaGLIPtin (JANUVIA) 100 MG tablet Take 1 tablet (100 mg total) by mouth daily. 30 tablet 11  . Vitamin D, Ergocalciferol, (DRISDOL) 50000 units CAPS capsule Take 1 capsule (50,000 Units total) by mouth every 7 (seven) days. 12 capsule 0  . warfarin (COUMADIN) 5 MG tablet TAKE AS DIRECTED BY COUMADIN CLINIC (Patient taking differently: TAKE 1 1/2 TABLET (7.5 MG) BY MOUTH ON MON/WED/FRI EVENING, TAKE 1 TABLET (5 MG) ON SUN/TUE/THU/SAT EVENING) 135 tablet 1   No current facility-administered medications for this visit.     Allergies:   Insulin glargine    Social History:  The patient  reports that he has never smoked. He has never used smokeless tobacco. He reports that he does not drink alcohol or use drugs.   Family History:  The patient's family history includes Coronary artery disease in his mother; Coronary artery disease (age of onset: 71) in his other;  Diabetes in his brother; Hypertension in his mother.    ROS: All other systems are reviewed and negative. Unless otherwise mentioned in H&P    PHYSICAL EXAM: VS:  BP 124/88   Pulse (!) 111   Ht 6\' 2"  (1.88 m)   Wt 294 lb 3.2 oz (133.4 kg)   BMI 37.77 kg/m  , BMI Body mass index is 37.77 kg/m. GEN: Well nourished, well developed, in no acute distress  HEENT: normal  Neck: no JVD, carotid bruits, or masses Cardiac: RRR; tachycardic with crisp click noted, no murmurs, rubs, or gallops,no edema  Respiratory:  clear to auscultation bilaterally, normal work of breathing GI: soft, nontender, nondistended, + BS MS: no deformity or atrophy  Skin: warm and dry, no  rash Neuro:  Strength and sensation are intact Psych: euthymic mood, full affect   EKG: Atrial flutter with variable conduction, PVC;s rate of 111 bpm.   Recent Labs: 08/31/2017: ALT 30; Pro B Natriuretic peptide (BNP) 116.0; TSH 3.69 10/21/2017: B Natriuretic Peptide 134.6 10/24/2017: BUN 13; Creatinine, Ser 1.12; Potassium 3.6; Sodium 138 10/26/2017: Hemoglobin 12.9; Platelets 147    Lipid Panel    Component Value Date/Time   CHOL 139 10/22/2017 0947   TRIG 95 10/22/2017 0947   HDL 33 (L) 10/22/2017 0947   CHOLHDL 4.2 10/22/2017 0947   VLDL 19 10/22/2017 0947   LDLCALC 87 10/22/2017 0947   LDLDIRECT 88.0 02/28/2017 1021      Wt Readings from Last 3 Encounters:  11/09/17 294 lb 3.2 oz (133.4 kg)  10/26/17 296 lb 8.3 oz (134.5 kg)  08/31/17 299 lb (135.6 kg)      Other studies Reviewed: Conclusion    Ost RCA lesion is 100% stenosed.  Origin lesion is 99% stenosed, SVG to RCA graft. A drug-eluting stent was successfully placed using a STENT RESOLUTE ONYX 5.0X15, postdilated to 5.5 mm.  Post intervention, there is a 10% residual stenosis.  Prox Cx to Mid Cx lesion is 25% stenosed.  Mid LAD lesion is 25% stenosed.  Hemodynamic findings consistent with mild pulmonary hypertension.  Difficult to engage the  SVG to RCA with a guide catheter. Had to place guide near the prior stent and was fortunate to get wire into the vessel, which allowed the guide to be pulled in.  A drug-eluting stent was successfully placed using a STENT RESOLUTE ONYX 5.0X15.  Restart heparin 6 hours post sheath pull.   Recommend to resume Warfarin, at currently prescribed dose and frequency, today, 10/23/2017.    ASSESSMENT AND PLAN:  1. Atrial fib: Rate is not well controlled despite high dose metoprolol at 200 mg daily. I am reluctant to begin amiodarone as he is also on coumadin. His last INR was elevated per the patient > 3..0  I will stop the amlodipine and begin diltiazem 30 mg TID instead for both HR control and BP control, leaving room to up-titrate if necessary. He will return to see [place] in one week for BP check and EKG and I will see him again on one month.   2. S/P AVR: He has a MDT tilting disc mechanical aortic valve placed in 2000. Continues coumadin therapy. He has had recent echocardiogram on 10/24/2017 with normally functioning valve with mean AV gradient has increased from 19 to 78mmHg. The right ventricular systolic pressure was ncreased consistent with mild pulmonary hypertension.  3. Dyspnea: May be related to elevated HR in uncontrolled atrial fib. He also admits to snoring. I will order a sleep study for him for diagnostic/prognostic purposes in the setting of elevated pulmonary pressures.   4. CAD: S/P DES to the VG to RCA, remains on Plavix and ASA. Will stop ASA in 2 weeks.   5. Diabetes: Followed by PCP. Consider adding Januvia for cardioprotection.   6. Hypercholesterolemia: On statin therapy with atorvastatin.   Current medicines are reviewed at length with the patient today.    Labs/ tests ordered today include: None   Phill Myron. West Pugh, ANP, AACC   11/09/2017 9:36 AM    [facility] [address], [place], [place] [address] Phone: [phone]; Fax: [phone]

## 2017-11-09 ENCOUNTER — Encounter: Payer: Self-pay | Admitting: Adult Health

## 2017-11-09 ENCOUNTER — Ambulatory Visit (INDEPENDENT_AMBULATORY_CARE_PROVIDER_SITE_OTHER): Payer: Medicare HMO | Admitting: Adult Health

## 2017-11-09 VITALS — BP 124/88 | HR 111 | Ht 74.0 in | Wt 294.2 lb

## 2017-11-09 DIAGNOSIS — I482 Chronic atrial fibrillation, unspecified: Secondary | ICD-10-CM

## 2017-11-09 DIAGNOSIS — G4733 Obstructive sleep apnea (adult) (pediatric): Secondary | ICD-10-CM

## 2017-11-09 DIAGNOSIS — I2510 Atherosclerotic heart disease of native coronary artery without angina pectoris: Secondary | ICD-10-CM | POA: Diagnosis not present

## 2017-11-09 DIAGNOSIS — E7800 Pure hypercholesterolemia, unspecified: Secondary | ICD-10-CM

## 2017-11-09 DIAGNOSIS — Z952 Presence of prosthetic heart valve: Secondary | ICD-10-CM

## 2017-11-09 DIAGNOSIS — I10 Essential (primary) hypertension: Secondary | ICD-10-CM | POA: Diagnosis not present

## 2017-11-09 DIAGNOSIS — R0602 Shortness of breath: Secondary | ICD-10-CM | POA: Diagnosis not present

## 2017-11-09 MED ORDER — DILTIAZEM HCL 30 MG PO TABS: 30.0000 mg | ORAL_TABLET | Freq: Three times a day (TID) | ORAL | 5 refills | Status: DC

## 2017-11-09 NOTE — Patient Instructions (Signed)
Medication Instructions:  STOP AMLODIPINE  START DILTIAZEM 30MG  THREE-TIMES-DAILY If you need a refill on your cardiac medications before your next appointment, please call your pharmacy.  Follow-Up: Your physician wants you to follow-up in: [place] BP CHECK AND EKG WITH PHARMACIST  Your physician wants you to follow-up in: [place] (NURSE PRACTIONIER), DNP,AACC IF PRIMARY CARDIOLOGIST IS UNAVAILABLE.   Thank you for choosing [facility] at [facility]!!

## 2017-11-13 ENCOUNTER — Telehealth (HOSPITAL_COMMUNITY): Payer: Self-pay

## 2017-11-13 NOTE — Telephone Encounter (Signed)
Patients insurance is active and benefits verified through [facility] - $10.00 co-pay, no deductible, out of pocket amount of $3,400/$240.00 has been met, no co-insurance, and no pre-authorization is required. Passport/reference [phone]  Will contact patient to see if he is interested in the Cardiac Rehab program. If interested, patient will be contacted for scheduling upon review by the RN Navigator.

## 2017-11-13 NOTE — Telephone Encounter (Signed)
Called patient to see if he is interested in the Cardiac Rehab program. Patient stated he is interested. Explained scheduling process and went over insurance, patient verbalized understanding. Will contact patient for scheduling upon review by the RN Navigator.

## 2017-11-14 ENCOUNTER — Ambulatory Visit (INDEPENDENT_AMBULATORY_CARE_PROVIDER_SITE_OTHER): Payer: Medicare HMO | Admitting: Pharmacist

## 2017-11-14 VITALS — BP 118/80 | HR 72

## 2017-11-14 DIAGNOSIS — Z8679 Personal history of other diseases of the circulatory system: Secondary | ICD-10-CM

## 2017-11-14 DIAGNOSIS — Z5181 Encounter for therapeutic drug level monitoring: Secondary | ICD-10-CM | POA: Diagnosis not present

## 2017-11-14 DIAGNOSIS — I482 Chronic atrial fibrillation, unspecified: Secondary | ICD-10-CM

## 2017-11-14 DIAGNOSIS — I480 Paroxysmal atrial fibrillation: Secondary | ICD-10-CM | POA: Diagnosis not present

## 2017-11-14 LAB — POCT INR: INR: 2.7 (ref 2.0–3.0)

## 2017-11-14 NOTE — Patient Instructions (Addendum)
Return for a  follow up appointment in 4 weeks  Check your blood pressure at home daily (if able) and keep record of the readings.  Take your BP meds as follows: *NO medication changes*  Bring all of your meds, your BP cuff and your record of home blood pressures to your next appointment.  Exercise as you're able, try to walk approximately 30 minutes per day.  Keep salt intake to a minimum, especially watch canned and prepared boxed foods.  Eat more fresh fruits and vegetables and fewer canned items.  Avoid eating in fast food restaurants.    HOW TO TAKE YOUR BLOOD PRESSURE: . Rest 5 minutes before taking your blood pressure. .  Don't smoke or drink caffeinated beverages for at least 30 minutes before. . Take your blood pressure before (not after) you eat. . Sit comfortably with your back supported and both feet on the floor (don't cross your legs). . Elevate your arm to heart level on a table or a desk. . Use the proper sized cuff. It should fit smoothly and snugly around your bare upper arm. There should be enough room to slip a fingertip under the cuff. The bottom edge of the cuff should be 1 inch above the crease of the elbow. . Ideally, take 3 measurements at one sitting and record the average.

## 2017-11-14 NOTE — Progress Notes (Signed)
Patient ID: [name]                 DOB: 1965/07/20                      MRN: [id number]     HPI: [name] is a 52 y.o. male referred by Dr. Stanford Breed to HTN clinic.  PMH includes CAD s/p PCI, CABG, mechanical AVR, HF with EF of 55-60%, atrial fibrillation, hypertension, hyperlipidemia, and DM-II.  During most recent visit with Vilinda Flake NP his heat arte was above 100bpm and additional rate control was initiated. Amlodipine was discounted at the time and replaced with diltiazem 30mg  TID. Today, patient presents for BP monitoring and EKG.  Patient denies any symptoms of dizziness, fatigue, swelling, chest pains or headaches. Reports unchanged SOB since recent hospital discharge.  Current HTN meds:  Diltiazem 30mg  TID Furosemide 40mg  daily Losartan 100mg  daily Metoprolol succinate 200mg  daily  BP goal: 130/80  Family History: The patient's family history includes Coronary artery disease in his mother; Coronary artery disease (age of onset: 18) in his other; Diabetes in his brother; Hypertension in his mother.   Social History: The patient  reports that he has never smoked. He has never used smokeless tobacco. He reports that he does not drink alcohol or use drugs.   Wt Readings from Last 3 Encounters:  11/09/17 294 lb 3.2 oz (133.4 kg)  10/26/17 296 lb 8.3 oz (134.5 kg)  08/31/17 299 lb (135.6 kg)   BP Readings from Last 3 Encounters:  11/14/17 118/80  11/09/17 124/88  10/26/17 (!) 134/94   Pulse Readings from Last 3 Encounters:  11/14/17 72  11/09/17 (!) 111  10/26/17 83     Past Medical History:  Diagnosis Date  . Ascending aortic dissection ([place])    a. 2000 - with aortic valve involvement. S/p repair of aortic dissection with placement of mechanical AVR.  [name] CAD (coronary artery disease)    a. 2000 VG->PDA @ time of Ao dissection repair;  b. Cardiac CT 06/2010: no CAD, only mild plaque in prox RCA;  c. 2015 Cath: LM nl, LAD nl, LCX nl, RCA 100, VG->PDA 90  (4.5x12 Rebel BMS). d. 10/23/17 instent restenosis SVG to RCA, DES placed.  . Cholelithiasis 08/22/2013  . Chronic diastolic CHF (congestive heart failure) ([place])    a. 03/2016 Echo: EF 55-60%, no rwma, Gr2 DD.  . Diabetes mellitus   . Dyspnea   . Gross hematuria   . H/O mechanical aortic valve replacement    a. 03/2016 Echo: AoV mean gradient 65mmHg, Valve Area (VTI) 1.36 cm^2, (Vmax) 1.22 cm^2, mod dil LA.  [name] Hyperlipidemia   . Hypertension   . NEPHROLITHIASIS, HX OF   . Obesity   . PAF (paroxysmal atrial fibrillation) ([place])    a. H/o such, with recurrence of coarse afib/flutter during ER consult 03/2012.  [name] TRANSIENT ISCHEMIC ATTACK, HX OF    a. In 2004.  [name] Unspecified vitamin D deficiency     Current Outpatient Medications on File Prior to Visit  Medication Sig Dispense Refill  . aspirin 81 MG chewable tablet Chew 1 tablet (81 mg total) by mouth daily. Take for 30 days only. 30 tablet 0  . atorvastatin (LIPITOR) 40 MG tablet Take 1 tablet (40 mg total) by mouth daily at 6 PM. 90 tablet 3  . cholecalciferol (VITAMIN D) 1000 UNITS tablet Take 1,000 Units by mouth daily.    . clopidogrel (PLAVIX) 75 MG  tablet Take 1 tablet (75 mg total) by mouth daily. 90 tablet 3  . diltiazem (CARDIZEM) 30 MG tablet Take 1 tablet (30 mg total) by mouth 3 (three) times daily. 90 tablet 5  . furosemide (LASIX) 40 MG tablet Take 1 tablet (40 mg total) by mouth 2 (two) times daily. 180 tablet 1  . glimepiride (AMARYL) 1 MG tablet TAKE 3 TABLETS ONE TIME DAILY WITH BREAKFAST (Patient taking differently: TAKE 1 TABLET (1 MG) BY MOUTH DAILY) 270 tablet 3  . levothyroxine (SYNTHROID, LEVOTHROID) 50 MCG tablet TAKE 1 TABLET EVERY DAY 90 tablet 3  . losartan (COZAAR) 100 MG tablet TAKE 1 TABLET EVERY DAY 90 tablet 3  . metFORMIN (GLUCOPHAGE) 1000 MG tablet TAKE 1 TABLET TWICE DAILY WITH MEALS 180 tablet 1  . metoprolol succinate (TOPROL-XL) 200 MG 24 hr tablet Take 1 tablet (200 mg total) by mouth daily. Take with  or immediately following a meal. 90 tablet 3  . nitroGLYCERIN (NITROSTAT) 0.4 MG SL tablet Place 1 tablet (0.4 mg total) under the tongue every 5 (five) minutes x 3 doses as needed for chest pain. 25 tablet 2  . Potassium Chloride ER 20 MEQ TBCR TAKE 2 TABLETS EVERY DAY (Patient taking differently: TAKE 1 TABLET BY MOUTH TWICE DAILY - AM AND 5PM) 180 tablet 0  . sitaGLIPtin (JANUVIA) 100 MG tablet Take 1 tablet (100 mg total) by mouth daily. 30 tablet 11  . Vitamin D, Ergocalciferol, (DRISDOL) 50000 units CAPS capsule Take 1 capsule (50,000 Units total) by mouth every 7 (seven) days. 12 capsule 0  . warfarin (COUMADIN) 5 MG tablet TAKE AS DIRECTED BY COUMADIN CLINIC (Patient taking differently: TAKE 1 1/2 TABLET (7.5 MG) BY MOUTH ON MON/WED/FRI EVENING, TAKE 1 TABLET (5 MG) ON SUN/TUE/THU/SAT EVENING) 135 tablet 1   No current facility-administered medications on file prior to visit.     Allergies  Allergen Reactions  . Insulin Glargine Swelling    edema    Blood pressure 118/80, pulse 72.  Chronic atrial fibrillation (HCC) Blood pressure well controlled today and patient stable. Patient denies swelling, chest pain, or increased fatigue.  All vital were taking manually with HR down to 72bpm. EKG was performed by triage nurse and reviewed by APP Almyra Deforest). EKG showed atrial fibrillation with HR at 76bpm. INR was also checked during this visit with value of 2.7 (goal 2-3). No additional intervention wes needed. Will cotinue all therapy as previously prescribed.  Patient encouraged to dicussed SOB with Vilinda Flake during next follow up.    Tanyia Grabbe Rodriguez-Guzman PharmD, BCPS, [place] [address] 11/15/2017 8:49 PM

## 2017-11-15 ENCOUNTER — Encounter: Payer: Self-pay | Admitting: Pharmacist

## 2017-11-15 NOTE — Assessment & Plan Note (Signed)
Blood pressure well controlled today and patient stable. Patient denies swelling, chest pain, or increased fatigue.  All vital were taking manually with HR down to 72bpm. EKG was performed by triage nurse and reviewed by APP Almyra Deforest). EKG showed atrial fibrillation with HR at 76bpm. INR was also checked during this visit with value of 2.7 (goal 2-3). No additional intervention wes needed. Will cotinue all therapy as previously prescribed.  Patient encouraged to dicussed SOB with Vilinda Flake during next follow up.

## 2017-11-24 ENCOUNTER — Other Ambulatory Visit: Payer: Self-pay | Admitting: Cardiology

## 2017-12-13 NOTE — Progress Notes (Deleted)
Cardiology Office Note   Date:  12/13/2017   ID:  [name], DOB [birth year], MRN [id number]  PCP:  Zachary Borg, MD  Cardiologist: [name]  No chief complaint on file.    History of Present Illness: [name] is a 52 y.o. male who presents for ongoing assessment and management of CAD,  PCI to VG to PDA, in 2015, CABG with VG to PDA,  aortic dissection s/p repair s/p [place],  with mechanical AVR, chronic diastolic CHF, chronic  atrial fibrillation, hypertension hyperlipidemia, and Type II diabetes.   Cardiac Cath on 10/23/2017 requiring a  DES to the SVG to RCA graft with DES. He was recommended for Plavix and ASA for one year, and to stop ASA in 30 days. He was to resume coumadin therapy.   On last off visit on 11/09/2017 I ordered a sleep study due to uncontrolled atrial fib and complaints of ongoing dyspnea and snoring, and elevated pulmonary pressures.                                        Past Medical History:  Diagnosis Date  . Ascending aortic dissection ([name])    a. 2000 - with aortic valve involvement. S/p repair of aortic dissection with placement of mechanical AVR.  [name] CAD (coronary artery disease)    a. 2000 VG->PDA @ time of Ao dissection repair;  b. Cardiac CT 06/2010: no CAD, only mild plaque in prox RCA;  c. 2015 Cath: LM nl, LAD nl, LCX nl, RCA 100, VG->PDA 90 (4.5x12 Rebel BMS). d. 10/23/17 instent restenosis SVG to RCA, DES placed.  . Cholelithiasis 08/22/2013  . Chronic diastolic CHF (congestive heart failure) ([name])    a. 03/2016 Echo: EF 55-60%, no rwma, Gr2 DD.  . Diabetes mellitus   . Dyspnea   . Gross hematuria   . H/O mechanical aortic valve replacement    a. 03/2016 Echo: AoV mean gradient 33mmHg, Valve Area (VTI) 1.36 cm^2, (Vmax) 1.22 cm^2, mod dil LA.  [name] Hyperlipidemia   . Hypertension   . NEPHROLITHIASIS, HX OF   . Obesity   . PAF (paroxysmal atrial fibrillation) ([name])    a. H/o such, with recurrence of coarse afib/flutter during ER consult  03/2012.  [name] TRANSIENT ISCHEMIC ATTACK, HX OF    a. In 2004.  [name] Unspecified vitamin D deficiency     Past Surgical History:  Procedure Laterality Date  . AORTIC VALVE REPLACEMENT  2000  . APPENDECTOMY  1998  . CORONARY ARTERY BYPASS GRAFT  2000  . CORONARY STENT INTERVENTION N/A 10/23/2017   Procedure: CORONARY STENT INTERVENTION;  Surgeon: [name], MD;  Location: [facility];  Service: Cardiovascular;  Laterality: N/A;  . LEFT AND RIGHT HEART CATHETERIZATION WITH CORONARY ANGIOGRAM N/A 07/19/2013   Procedure: LEFT AND RIGHT HEART CATHETERIZATION WITH CORONARY ANGIOGRAM;  Surgeon: [name], MD;  Location: [name] CATH LAB;  Service: Cardiovascular;  Laterality: N/A;  . RIGHT HEART CATH AND CORONARY/GRAFT ANGIOGRAPHY N/A 10/23/2017   Procedure: RIGHT HEART CATH AND CORONARY/GRAFT ANGIOGRAPHY;  Surgeon: [name], MD;  Location: [facility];  Service: Cardiovascular;  Laterality: N/A;  . TEE WITHOUT CARDIOVERSION N/A 07/22/2013   Procedure: TRANSESOPHAGEAL ECHOCARDIOGRAM (TEE);  Surgeon: [name], MD;  [name];  Service: Cardiovascular;  Laterality: N/A;     Current Outpatient Medications  Medication Sig Dispense Refill  .  aspirin 81 MG chewable tablet Chew 1 tablet (81 mg total) by mouth daily. Take for 30 days only. 30 tablet 0  . atorvastatin (LIPITOR) 40 MG tablet Take 1 tablet (40 mg total) by mouth daily at 6 PM. 90 tablet 3  . cholecalciferol (VITAMIN D) 1000 UNITS tablet Take 1,000 Units by mouth daily.    . clopidogrel (PLAVIX) 75 MG tablet Take 1 tablet (75 mg total) by mouth daily. 90 tablet 3  . diltiazem (CARDIZEM) 30 MG tablet Take 1 tablet (30 mg total) by mouth 3 (three) times daily. 90 tablet 5  . furosemide (LASIX) 40 MG tablet Take 1 tablet (40 mg total) by mouth 2 (two) times daily. 180 tablet 1  . glimepiride (AMARYL) 1 MG tablet TAKE 3 TABLETS ONE TIME DAILY WITH BREAKFAST (Patient taking differently: TAKE 1  TABLET (1 MG) BY MOUTH DAILY) 270 tablet 3  . levothyroxine (SYNTHROID, LEVOTHROID) 50 MCG tablet TAKE 1 TABLET EVERY DAY 90 tablet 3  . losartan (COZAAR) 100 MG tablet TAKE 1 TABLET EVERY DAY 90 tablet 3  . metFORMIN (GLUCOPHAGE) 1000 MG tablet TAKE 1 TABLET TWICE DAILY WITH MEALS 180 tablet 1  . metoprolol succinate (TOPROL-XL) 200 MG 24 hr tablet Take 1 tablet (200 mg total) by mouth daily. Take with or immediately following a meal. 90 tablet 3  . nitroGLYCERIN (NITROSTAT) 0.4 MG SL tablet Place 1 tablet (0.4 mg total) under the tongue every 5 (five) minutes x 3 doses as needed for chest pain. 25 tablet 2  . Potassium Chloride ER 20 MEQ TBCR TAKE 2 TABLETS EVERY DAY (Patient taking differently: TAKE 1 TABLET BY MOUTH TWICE DAILY - AM AND 5PM) 180 tablet 0  . sitaGLIPtin (JANUVIA) 100 MG tablet Take 1 tablet (100 mg total) by mouth daily. 30 tablet 11  . Vitamin D, Ergocalciferol, (DRISDOL) 50000 units CAPS capsule Take 1 capsule (50,000 Units total) by mouth every 7 (seven) days. 12 capsule 0  . warfarin (COUMADIN) 5 MG tablet TAKE AS DIRECTED BY COUMADIN CLINIC (Patient taking differently: TAKE 1 1/2 TABLET (7.5 MG) BY MOUTH ON MON/WED/FRI EVENING, TAKE 1 TABLET (5 MG) ON SUN/TUE/THU/SAT EVENING) 135 tablet 1   No current facility-administered medications for this visit.     Allergies:   Insulin glargine    Social History:  The patient  reports that he has never smoked. He has never used smokeless tobacco. He reports that he does not drink alcohol or use drugs.   Family History:  The patient's family history includes Coronary artery disease in his mother; Coronary artery disease (age of onset: 22) in his other; Diabetes in his brother; Hypertension in his mother.    ROS: All other systems are reviewed and negative. Unless otherwise mentioned in H&P    PHYSICAL EXAM: VS:  There were no vitals taken for this visit. , BMI There is no height or weight on file to calculate BMI. GEN: Well  nourished, well developed, in no acute distress  HEENT: normal  Neck: no JVD, carotid bruits, or masses Cardiac: ***RRR; no murmurs, rubs, or gallops,no edema  Respiratory:  clear to auscultation bilaterally, normal work of breathing GI: soft, nontender, nondistended, + BS MS: no deformity or atrophy  Skin: warm and dry, no rash Neuro:  Strength and sensation are intact Psych: euthymic mood, full affect   EKG:  EKG {ACTION; IS/IS [id number]} ordered today. The ekg ordered today demonstrates ***   Recent Labs: 08/31/2017: ALT 30; Pro B Natriuretic peptide (BNP)  116.0; TSH 3.69 10/21/2017: B Natriuretic Peptide 134.6 10/24/2017: BUN 13; Creatinine, Ser 1.12; Potassium 3.6; Sodium 138 10/26/2017: Hemoglobin 12.9; Platelets 147    Lipid Panel    Component Value Date/Time   CHOL 139 10/22/2017 0947   TRIG 95 10/22/2017 0947   HDL 33 (L) 10/22/2017 0947   CHOLHDL 4.2 10/22/2017 0947   VLDL 19 10/22/2017 0947   LDLCALC 87 10/22/2017 0947   LDLDIRECT 88.0 02/28/2017 1021      Wt Readings from Last 3 Encounters:  11/09/17 294 lb 3.2 oz (133.4 kg)  10/26/17 296 lb 8.3 oz (134.5 kg)  08/31/17 299 lb (135.6 kg)      Other studies Reviewed: Cardiac Cath 11/09/2017  Ost RCA lesion is 100% stenosed.  Origin lesion is 99% stenosed, SVG to RCA graft. A drug-eluting stent was successfully placed using a STENT RESOLUTE ONYX 5.0X15, postdilated to 5.5 mm.  Post intervention, there is a 10% residual stenosis.  Prox Cx to Mid Cx lesion is 25% stenosed.  Mid LAD lesion is 25% stenosed.  Hemodynamic findings consistent with mild pulmonary hypertension.  Difficult to engage the SVG to RCA with a guide catheter. Had to place guide near the prior stent and was fortunate to get wire into the vessel, which allowed the guide to be pulled in.  A drug-eluting stent was successfully placed using a STENT RESOLUTE ONYX 5.0X15.  Restart heparin 6 hours post sheath pull.   Recommend to resume  Warfarin, at currently prescribed dose and frequency, today, 10/23/2017.  ASSESSMENT AND PLAN:  1.  ***   Current medicines are reviewed at length with the patient today.    Labs/ tests ordered today include: *** Phill Myron. [place] Pugh, ANP, AACC   12/13/2017 1:50 PM    [facility] [address] [name], [place] [address] Phone: [phone]; Fax: [phone]

## 2017-12-14 ENCOUNTER — Ambulatory Visit: Payer: Medicare HMO | Admitting: Adult Health

## 2017-12-14 DIAGNOSIS — R0989 Other specified symptoms and signs involving the circulatory and respiratory systems: Secondary | ICD-10-CM

## 2017-12-15 ENCOUNTER — Encounter: Payer: Self-pay | Admitting: *Deleted

## 2017-12-27 ENCOUNTER — Ambulatory Visit (INDEPENDENT_AMBULATORY_CARE_PROVIDER_SITE_OTHER): Payer: Medicare HMO | Admitting: *Deleted

## 2017-12-27 ENCOUNTER — Telehealth (HOSPITAL_COMMUNITY): Payer: Self-pay

## 2017-12-27 ENCOUNTER — Telehealth: Payer: Self-pay | Admitting: Pharmacist Clinician (PhC)/ Clinical Pharmacy Specialist

## 2017-12-27 DIAGNOSIS — Z8679 Personal history of other diseases of the circulatory system: Secondary | ICD-10-CM

## 2017-12-27 DIAGNOSIS — I480 Paroxysmal atrial fibrillation: Secondary | ICD-10-CM

## 2017-12-27 DIAGNOSIS — Z5181 Encounter for therapeutic drug level monitoring: Secondary | ICD-10-CM

## 2017-12-27 LAB — POCT INR: INR: 2.3 (ref 2.0–3.0)

## 2017-12-27 NOTE — Telephone Encounter (Signed)
Patient was in Coumadin clinic this am and reported that he is no longer taking med given to him at last appt by Jory Sims.  States he broke out in rash with itching about a week after starting.  He was seen in [facility] 6/25 and no mention was made of rash at that time.     Reviewed chart, patient was given diltiazem 30 mg tid to help improve rate control in AF.    Will forward to PA.  Patient has f/u appt with Jory Sims on 8/28.

## 2017-12-27 NOTE — Telephone Encounter (Signed)
Called patient to inform him that if he is interested in the Cardiac Rehab program that he will need to complete follow up appt on 01/18/18 - patient stated he is not interested. Closed referral.

## 2017-12-27 NOTE — Telephone Encounter (Signed)
Ok. Will probably need to start amiodarone for afib. Please have him come by for EKG before I seen on on 01/17/2018 to evaluate his rhythm.

## 2017-12-27 NOTE — Telephone Encounter (Signed)
I'll forward this to [facility] for her to coordinate with patient

## 2017-12-27 NOTE — Patient Instructions (Signed)
Description   INR range change to 2.5-3.5 after stopping ASA on 11/25/17. Will stop aspirin today.  Take 2  tablets today then Continue taking 1 tablet daily except 1.5 tablets only on Mondays,Wednesday, and Fridays. Recheck INR in 10 days.  Call us with any new medications or concerns [phone] [facility] [phone].

## 2017-12-27 NOTE — Telephone Encounter (Signed)
D/W PT HE STATES THAT AFTER THE LAST APPT HE TOOK "THAT NEW MEDICATION THAT KATHERYN GAVE ME" FOR ONE DAY AND GOT AN  ITCHING RASH "ALL OVER" SO HE DIDN'T TAKE IT AGAIN. PT DENIES ANY HEART RACING SINCE LAST APPT SO HE DIDN'T THINK THAT IT WAS IMPORTANT TO CALL BACK AGAIN. HE STATES THAT HIS BP HAS NOT BEEN OVER 153 SYSTOLIC. SCHEDULED AN APPT TO DISCUSS

## 2018-01-05 ENCOUNTER — Ambulatory Visit (INDEPENDENT_AMBULATORY_CARE_PROVIDER_SITE_OTHER): Payer: Medicare HMO | Admitting: Pharmacist

## 2018-01-05 DIAGNOSIS — Z5181 Encounter for therapeutic drug level monitoring: Secondary | ICD-10-CM | POA: Diagnosis not present

## 2018-01-05 DIAGNOSIS — Z8679 Personal history of other diseases of the circulatory system: Secondary | ICD-10-CM | POA: Diagnosis not present

## 2018-01-05 DIAGNOSIS — I480 Paroxysmal atrial fibrillation: Secondary | ICD-10-CM

## 2018-01-05 LAB — POCT INR: INR: 1.9 — AB (ref 2.0–3.0)

## 2018-01-05 NOTE — Patient Instructions (Signed)
Description   Take 2 tablets today and 1.5 tablets tomorrow, then continue taking 1 tablet daily except 1.5 tablets only on Mondays, Wednesday, and Fridays. Recheck INR in 10 days at [employer] office - seeing Kerin Ransom, [place] that day.  Call us with any new medications or concerns [phone] [facility] [phone].

## 2018-01-15 ENCOUNTER — Encounter: Payer: Self-pay | Admitting: Cardiology

## 2018-01-15 ENCOUNTER — Ambulatory Visit (INDEPENDENT_AMBULATORY_CARE_PROVIDER_SITE_OTHER): Payer: Medicare HMO | Admitting: Cardiology

## 2018-01-15 ENCOUNTER — Other Ambulatory Visit: Payer: Self-pay | Admitting: Cardiology

## 2018-01-15 ENCOUNTER — Ambulatory Visit (INDEPENDENT_AMBULATORY_CARE_PROVIDER_SITE_OTHER): Payer: Medicare HMO | Admitting: Pharmacist

## 2018-01-15 VITALS — BP 150/90 | HR 73 | Ht 74.0 in | Wt 292.4 lb

## 2018-01-15 DIAGNOSIS — I7101 Dissection of ascending aorta: Secondary | ICD-10-CM

## 2018-01-15 DIAGNOSIS — Z5181 Encounter for therapeutic drug level monitoring: Secondary | ICD-10-CM | POA: Diagnosis not present

## 2018-01-15 DIAGNOSIS — E669 Obesity, unspecified: Secondary | ICD-10-CM | POA: Insufficient documentation

## 2018-01-15 DIAGNOSIS — I482 Chronic atrial fibrillation, unspecified: Secondary | ICD-10-CM

## 2018-01-15 DIAGNOSIS — I10 Essential (primary) hypertension: Secondary | ICD-10-CM | POA: Diagnosis not present

## 2018-01-15 DIAGNOSIS — Z8679 Personal history of other diseases of the circulatory system: Secondary | ICD-10-CM

## 2018-01-15 DIAGNOSIS — I480 Paroxysmal atrial fibrillation: Secondary | ICD-10-CM

## 2018-01-15 DIAGNOSIS — I5033 Acute on chronic diastolic (congestive) heart failure: Secondary | ICD-10-CM | POA: Diagnosis not present

## 2018-01-15 DIAGNOSIS — Z955 Presence of coronary angioplasty implant and graft: Secondary | ICD-10-CM

## 2018-01-15 LAB — POCT INR: INR: 2 (ref 2.0–3.0)

## 2018-01-15 MED ORDER — POTASSIUM CHLORIDE ER 20 MEQ PO TBCR: EXTENDED_RELEASE_TABLET | ORAL | 3 refills | Status: DC

## 2018-01-15 MED ORDER — ATORVASTATIN CALCIUM 40 MG PO TABS: 40.0000 mg | ORAL_TABLET | Freq: Every day | ORAL | 3 refills | Status: DC

## 2018-01-15 MED ORDER — AMLODIPINE BESYLATE 10 MG PO TABS: 10.0000 mg | ORAL_TABLET | Freq: Every day | ORAL | 3 refills | Status: DC

## 2018-01-15 MED ORDER — CLOPIDOGREL BISULFATE 75 MG PO TABS: 75.0000 mg | ORAL_TABLET | Freq: Every day | ORAL | 3 refills | Status: DC

## 2018-01-15 MED ORDER — WARFARIN SODIUM 5 MG PO TABS: ORAL_TABLET | ORAL | 1 refills | Status: DC

## 2018-01-15 MED ORDER — AMLODIPINE BESYLATE 10 MG PO TABS: 10.0000 mg | ORAL_TABLET | Freq: Every day | ORAL | 0 refills | Status: DC

## 2018-01-15 MED ORDER — FUROSEMIDE 40 MG PO TABS: 40.0000 mg | ORAL_TABLET | Freq: Two times a day (BID) | ORAL | 3 refills | Status: DC

## 2018-01-15 NOTE — Assessment & Plan Note (Signed)
BMI 37- he snores but denies poor sleep or daytime fatigue

## 2018-01-15 NOTE — Assessment & Plan Note (Signed)
Sub optimal control- will add back Amlodipine

## 2018-01-15 NOTE — Patient Instructions (Signed)
Take 1 and 1/2 tablet today 8/26, then increase dose to  taking 1.5 tablets daily except 1 tablet only on Mondays, Wednesday, and Fridays. Next INR in 2 weeks.  Call us with any new medications or concerns [phone] [facility] [phone].

## 2018-01-15 NOTE — Patient Instructions (Signed)
Medication Instructions:  Your physician has recommended you make the following change in your medication:  RESUME Amlodipine 10mg  daily. An Rx has been sent to your pharmacy  Your cardiac medications have been refilled today.  Labwork: None ordered  Testing/Procedures: None ordered  Follow-Up: Your physician wants you to follow-up in: 6 months with Dr.Crenshaw You will receive a reminder letter in the mail two months in advance. If you don't receive a letter, please call our office to schedule the follow-up appointment.   Any Other Special Instructions Will Be Listed Below (If Applicable). Your physician has requested that you regularly monitor and record your blood pressure readings at home. Please use the same machine at the same time of day to check your readings and record them to bring to your follow-up visit.       If you need a refill on your cardiac medications before your next appointment, please call your pharmacy.

## 2018-01-15 NOTE — Assessment & Plan Note (Signed)
2000 - with aortic valve involvement. S/p repair of aortic dissection with placement of mechanical AVR.

## 2018-01-15 NOTE — Assessment & Plan Note (Signed)
SVG-RCA PCI in 2015 and June 2019 ([facility])

## 2018-01-15 NOTE — Progress Notes (Signed)
01/15/2018 [name]   17-May-1966  [id number]  Primary Physician Biagio Borg, MD Primary Cardiologist: Dr Stanford Breed  HPI:  52 y/o overweight, AA male, former semi-pro football player, with a history of Bentall procedure in 2004 with CABG, s/p SVG-RCA PCI with BMS in Feb 2015 and re do PCI June 2019 with a BMS for ISR. He also has a history of PAF, TIA' 04, NIDDM, and HTN. He saw Jory Sims in the office 11/09/17 and was noted to be in AF. Diltiazem was added and Amlodipine 10 mg was stopped. The pt says he took a couple of doses of diltiazem and developed a rash and itching and stopped it. He never resumed the Amlodipine. He is in the office today for follow up. He feels well, no palpations, no unusual SOB or chest pain.    Current Outpatient Medications  Medication Sig Dispense Refill  . atorvastatin (LIPITOR) 40 MG tablet Take 1 tablet (40 mg total) by mouth daily at 6 PM. 90 tablet 3  . cholecalciferol (VITAMIN D) 1000 UNITS tablet Take 1,000 Units by mouth daily.    . clopidogrel (PLAVIX) 75 MG tablet Take 1 tablet (75 mg total) by mouth daily. 90 tablet 3  . furosemide (LASIX) 40 MG tablet Take 1 tablet (40 mg total) by mouth 2 (two) times daily. 180 tablet 3  . glimepiride (AMARYL) 1 MG tablet TAKE 3 TABLETS ONE TIME DAILY WITH BREAKFAST (Patient taking differently: TAKE 1 TABLET (1 MG) BY MOUTH DAILY) 270 tablet 3  . levothyroxine (SYNTHROID, LEVOTHROID) 50 MCG tablet TAKE 1 TABLET EVERY DAY 90 tablet 3  . losartan (COZAAR) 100 MG tablet TAKE 1 TABLET EVERY DAY 90 tablet 3  . metFORMIN (GLUCOPHAGE) 1000 MG tablet TAKE 1 TABLET TWICE DAILY WITH MEALS 180 tablet 1  . metoprolol succinate (TOPROL-XL) 200 MG 24 hr tablet Take 1 tablet (200 mg total) by mouth daily. Take with or immediately following a meal. 90 tablet 3  . nitroGLYCERIN (NITROSTAT) 0.4 MG SL tablet Place 1 tablet (0.4 mg total) under the tongue every 5 (five) minutes x 3 doses as needed for chest pain. 25  tablet 2  . Potassium Chloride ER 20 MEQ TBCR TAKE 1 TABLET BY MOUTH TWICE DAILY - AM AND 5PM 180 tablet 3  . sitaGLIPtin (JANUVIA) 100 MG tablet Take 1 tablet (100 mg total) by mouth daily. 30 tablet 11  . Vitamin D, Ergocalciferol, (DRISDOL) 50000 units CAPS capsule Take 1 capsule (50,000 Units total) by mouth every 7 (seven) days. 12 capsule 0  . warfarin (COUMADIN) 5 MG tablet TAKE AS DIRECTED BY COUMADIN CLINIC (Patient taking differently: TAKE 1 1/2 TABLET (7.5 MG) BY MOUTH ON MON/WED/FRI EVENING, TAKE 1 TABLET (5 MG) ON SUN/TUE/THU/SAT EVENING) 135 tablet 1  . amLODipine (NORVASC) 10 MG tablet Take 1 tablet (10 mg total) by mouth daily. 90 tablet 3   No current facility-administered medications for this visit.     Allergies  Allergen Reactions  . Diltiazem Hives  . Insulin Glargine Swelling    edema    Past Medical History:  Diagnosis Date  . Ascending aortic dissection ([place])    a. 2000 - with aortic valve involvement. S/p repair of aortic dissection with placement of mechanical AVR.  [name] CAD (coronary artery disease)    a. 2000 VG->PDA @ time of Ao dissection repair;  b. Cardiac CT 06/2010: no CAD, only mild plaque in prox RCA;  c. 2015 Cath: LM nl, LAD nl, LCX  nl, RCA 100, VG->PDA 90 (4.5x12 Rebel BMS). d. 10/23/17 instent restenosis SVG to RCA, DES placed.  . Cholelithiasis 08/22/2013  . Chronic diastolic CHF (congestive heart failure) ([place])    a. 03/2016 Echo: EF 55-60%, no rwma, Gr2 DD.  . Diabetes mellitus   . Dyspnea   . Gross hematuria   . H/O mechanical aortic valve replacement    a. 03/2016 Echo: AoV mean gradient 53mmHg, Valve Area (VTI) 1.36 cm^2, (Vmax) 1.22 cm^2, mod dil LA.  [name] Hyperlipidemia   . Hypertension   . NEPHROLITHIASIS, HX OF   . Obesity   . PAF (paroxysmal atrial fibrillation) ([place])    a. H/o such, with recurrence of coarse afib/flutter during ER consult 03/2012.  [name] TRANSIENT ISCHEMIC ATTACK, HX OF    a. In 2004.  [name] Unspecified vitamin D deficiency      Social History   Socioeconomic History  . Marital status: Married    Spouse name: Not on file  . Number of children: 4  . Years of education: Not on file  . Highest education level: Not on file  Occupational History  . Occupation: DISABLED    Employer: UNEMPLOYED  Social Needs  . Financial resource strain: Not on file  . Food insecurity:    Worry: Not on file    Inability: Not on file  . Transportation needs:    Medical: Not on file    Non-medical: Not on file  Tobacco Use  . Smoking status: Never Smoker  . Smokeless tobacco: Never Used  Substance and Sexual Activity  . Alcohol use: No    Alcohol/week: 0.0 standard drinks  . Drug use: No  . Sexual activity: Not on file  Lifestyle  . Physical activity:    Days per week: Not on file    Minutes per session: Not on file  . Stress: Not on file  Relationships  . Social connections:    Talks on phone: Not on file    Gets together: Not on file    Attends religious service: Not on file    Active member of club or organization: Not on file    Attends meetings of clubs or organizations: Not on file    Relationship status: Not on file  . Intimate partner violence:    Fear of current or ex partner: Not on file    Emotionally abused: Not on file    Physically abused: Not on file    Forced sexual activity: Not on file  Other Topics Concern  . Not on file  Social History Narrative  . Not on file     Family History  Problem Relation Age of Onset  . Coronary artery disease Mother   . Hypertension Mother   . Diabetes Brother   . Coronary artery disease Other 72       male 1st degree relative, CABG in his 75's (father)     Review of Systems: General: negative for chills, fever, night sweats or weight changes.  Cardiovascular: negative for chest pain, dyspnea on exertion, edema, orthopnea, palpitations, paroxysmal nocturnal dyspnea or shortness of breath Dermatological: negative for rash Respiratory: negative for  cough or wheezing Urologic: negative for hematuria Abdominal: negative for nausea, vomiting, diarrhea, bright red blood per rectum, melena, or hematemesis Neurologic: negative for visual changes, syncope, or dizziness All other systems reviewed and are otherwise negative except as noted above.    Blood pressure (!) 150/90, pulse 73, height 6\' 2"  (1.88 m), weight 292 lb 6.4  oz (132.6 kg).  General appearance: alert, cooperative, no distress and moderately obese Neck: no carotid bruit and no JVD Lungs: end inspiratory crackles on Lt base Heart: regular rate and rhythm and 2/6 systolic murmur, positive valve sounds Extremities: no edema Skin: Skin color, texture, turgor normal. No rashes or lesions Neurologic: Grossly normal  EKG NSR, HR-73  ASSESSMENT AND PLAN:   PAF (paroxysmal atrial fibrillation) ([place]) Recurrent June 2019- now back in NSR. He had an allergic reaction to Diltiazem.   Status post coronary artery stent placement SVG-RCA PCI in 2015 and June 2019 (BMS)  Ascending aortic dissection  in 2000- s/p Bentall and MDT tilting disc AVR 2000 - with aortic valve involvement. S/p repair of aortic dissection with placement of mechanical AVR.  Essential hypertension Sub optimal control- will add back Amlodipine  Obesity (BMI 30-39.9) BMI 37- he snores but denies poor sleep or daytime fatigue   PLAN  Resume Amlodipine 10 mg. F/U Dr Stanford Breed in 6 months.   Kerin Ransom PA-C 01/15/2018 8:36 AM

## 2018-01-15 NOTE — Assessment & Plan Note (Signed)
Recurrent June 2019- now back in NSR. He had an allergic reaction to Diltiazem.

## 2018-01-18 ENCOUNTER — Ambulatory Visit: Payer: Medicare HMO | Admitting: Adult Health

## 2018-01-29 ENCOUNTER — Ambulatory Visit (INDEPENDENT_AMBULATORY_CARE_PROVIDER_SITE_OTHER): Payer: Medicare HMO | Admitting: *Deleted

## 2018-01-29 DIAGNOSIS — Z5181 Encounter for therapeutic drug level monitoring: Secondary | ICD-10-CM

## 2018-01-29 DIAGNOSIS — I480 Paroxysmal atrial fibrillation: Secondary | ICD-10-CM

## 2018-01-29 LAB — POCT INR: INR: 1.8 — AB (ref 2.0–3.0)

## 2018-01-29 NOTE — Patient Instructions (Signed)
Description   Today take 1.5 tablets and tomorrow take 2 tablets then start taking 1.5 tablets daily except 1 tablet only on Mondays and Wednesdays. Next INR in 2 weeks.  Call us with any new medications or concerns [phone] [facility] [phone].

## 2018-02-12 ENCOUNTER — Ambulatory Visit (INDEPENDENT_AMBULATORY_CARE_PROVIDER_SITE_OTHER): Payer: Medicare HMO | Admitting: Pharmacist

## 2018-02-12 DIAGNOSIS — I480 Paroxysmal atrial fibrillation: Secondary | ICD-10-CM

## 2018-02-12 DIAGNOSIS — Z5181 Encounter for therapeutic drug level monitoring: Secondary | ICD-10-CM

## 2018-02-12 LAB — POCT INR: INR: 4.4 — AB (ref 2.0–3.0)

## 2018-02-12 NOTE — Patient Instructions (Signed)
Description   Hold your coumadin today. Then continue taking 1.5 tablets daily except 1 tablet only on Mondays and Wednesdays. Next INR in 2 weeks.  Call us with any new medications or concerns [phone] [facility] [phone].

## 2018-02-26 ENCOUNTER — Ambulatory Visit (INDEPENDENT_AMBULATORY_CARE_PROVIDER_SITE_OTHER): Payer: Medicare HMO | Admitting: Pharmacist

## 2018-02-26 DIAGNOSIS — I480 Paroxysmal atrial fibrillation: Secondary | ICD-10-CM | POA: Diagnosis not present

## 2018-02-26 DIAGNOSIS — Z5181 Encounter for therapeutic drug level monitoring: Secondary | ICD-10-CM | POA: Diagnosis not present

## 2018-02-26 LAB — POCT INR: INR: 3.7 — AB (ref 2.0–3.0)

## 2018-02-26 NOTE — Patient Instructions (Signed)
Description   Take 1/2 tablet today, then continue taking 1.5 tablets daily except 1 tablet only on Mondays and Wednesdays. Next INR in 2 weeks.  Call us with any new medications or concerns [phone] [facility] [phone].

## 2018-03-07 ENCOUNTER — Other Ambulatory Visit (INDEPENDENT_AMBULATORY_CARE_PROVIDER_SITE_OTHER): Payer: Medicare HMO

## 2018-03-07 ENCOUNTER — Encounter: Payer: Self-pay | Admitting: Internal Medicine

## 2018-03-07 ENCOUNTER — Ambulatory Visit (INDEPENDENT_AMBULATORY_CARE_PROVIDER_SITE_OTHER): Payer: Medicare HMO | Admitting: Internal Medicine

## 2018-03-07 VITALS — BP 136/84 | HR 62 | Temp 97.9°F | Ht 74.0 in | Wt 292.0 lb

## 2018-03-07 DIAGNOSIS — M545 Low back pain, unspecified: Secondary | ICD-10-CM

## 2018-03-07 DIAGNOSIS — E119 Type 2 diabetes mellitus without complications: Secondary | ICD-10-CM | POA: Diagnosis not present

## 2018-03-07 DIAGNOSIS — E7800 Pure hypercholesterolemia, unspecified: Secondary | ICD-10-CM

## 2018-03-07 LAB — LIPID PANEL
CHOLESTEROL: 98 mg/dL (ref 0–200)
HDL: 25.3 mg/dL — AB (ref >39.00)
LDL CALC: 48 mg/dL (ref 0–99)
NonHDL: 72.59
TRIGLYCERIDES: 125 mg/dL (ref 0.0–149.0)
Total CHOL/HDL Ratio: 4
VLDL: 25 mg/dL (ref 0.0–40.0)

## 2018-03-07 LAB — HEPATIC FUNCTION PANEL
ALT: 13 U/L (ref 0–53)
AST: 20 U/L (ref 0–37)
Albumin: 3.8 g/dL (ref 3.5–5.2)
Alkaline Phosphatase: 63 U/L (ref 39–117)
BILIRUBIN DIRECT: 0.1 mg/dL (ref 0.0–0.3)
TOTAL PROTEIN: 7.9 g/dL (ref 6.0–8.3)
Total Bilirubin: 0.7 mg/dL (ref 0.2–1.2)

## 2018-03-07 LAB — BASIC METABOLIC PANEL
BUN: 14 mg/dL (ref 6–23)
CHLORIDE: 104 meq/L (ref 96–112)
CO2: 29 mEq/L (ref 19–32)
Calcium: 8.9 mg/dL (ref 8.4–10.5)
Creatinine, Ser: 0.97 mg/dL (ref 0.40–1.50)
GFR: 104.09 mL/min (ref >60.00)
Glucose, Bld: 131 mg/dL — ABNORMAL HIGH (ref 70–99)
POTASSIUM: 3.2 meq/L — AB (ref 3.5–5.1)
SODIUM: 140 meq/L (ref 135–145)

## 2018-03-07 LAB — HEMOGLOBIN A1C: Hgb A1c MFr Bld: 6.2 % (ref 4.6–6.5)

## 2018-03-07 MED ORDER — TRAMADOL HCL 50 MG PO TABS: 50.0000 mg | ORAL_TABLET | Freq: Four times a day (QID) | ORAL | 0 refills | Status: DC | PRN

## 2018-03-07 MED ORDER — CYCLOBENZAPRINE HCL 5 MG PO TABS: 5.0000 mg | ORAL_TABLET | Freq: Three times a day (TID) | ORAL | 1 refills | Status: DC | PRN

## 2018-03-07 NOTE — Assessment & Plan Note (Signed)
Hx and exam c/w likely underlying lumbar DJD/DDD with first time aggrevation; no neuro changes or symptoms; will hold on any imaging, for pain control and muscle relaxer as needed, consider sport med if not improved

## 2018-03-07 NOTE — Assessment & Plan Note (Signed)
Mild uncontrolled, to add trulicity, also for f/u lab today, cont diet, wt control, f/u lab next visit

## 2018-03-07 NOTE — Progress Notes (Signed)
Subjective:    Patient ID: [name], male    DOB: 1965-12-08, 52 y.o.   MRN: [id number]  HPI  Here to f/u; overall doing ok,  Pt denies chest pain, increasing sob or doe, wheezing, orthopnea, PND, increased LE swelling, palpitations, dizziness or syncope.  Pt denies new neurological symptoms such as new headache, or facial or extremity weakness or numbness.  Pt denies polydipsia, polyuria, or low sugar episode.  Pt states overall good compliance with meds, mostly trying to follow appropriate diet, with wt overall stable,  but little exercise however.  Wt Readings from Last 3 Encounters:  03/07/18 292 lb (132.5 kg)  01/15/18 292 lb 6.4 oz (132.6 kg)  11/09/17 294 lb 3.2 oz (133.4 kg)  C/o acute onset LBP x 3-4 days, mod to severe, constant, across the lower back but no other radicular pain, numb or weakness, better with epsom salt bath but only lasts aobut 2 hrs, worse to try to stand up, uses arms to push up or daughter pulls him up, worse to walk even 200 ft such as going across the street.  Seemed to start after gym work with 1 hr stat bike, 30 on treadmill at 3.3 mph, then hit just before started the elliptical;  No prior hx of LBP Has f/u coumadin clinicon oct 24 after recent mild elev 3.7 on oct 7.  Past Medical History:  Diagnosis Date  . Ascending aortic dissection ([place])    a. 2000 - with aortic valve involvement. S/p repair of aortic dissection with placement of mechanical AVR.  [name] CAD (coronary artery disease)    a. 2000 VG->PDA @ time of Ao dissection repair;  b. Cardiac CT 06/2010: no CAD, only mild plaque in prox RCA;  c. 2015 Cath: LM nl, LAD nl, LCX nl, RCA 100, VG->PDA 90 (4.5x12 Rebel BMS). d. 10/23/17 instent restenosis SVG to RCA, DES placed.  . Cholelithiasis 08/22/2013  . Chronic diastolic CHF (congestive heart failure) ([place])    a. 03/2016 Echo: EF 55-60%, no rwma, Gr2 DD.  . Diabetes mellitus   . Dyspnea   . Gross hematuria   . H/O mechanical aortic valve replacement      a. 03/2016 Echo: AoV mean gradient 32mmHg, Valve Area (VTI) 1.36 cm^2, (Vmax) 1.22 cm^2, mod dil LA.  [name] Hyperlipidemia   . Hypertension   . NEPHROLITHIASIS, HX OF   . Obesity   . PAF (paroxysmal atrial fibrillation) ([place])    a. H/o such, with recurrence of coarse afib/flutter during ER consult 03/2012.  [name] TRANSIENT ISCHEMIC ATTACK, HX OF    a. In 2004.  [name] Unspecified vitamin D deficiency    Past Surgical History:  Procedure Laterality Date  . AORTIC VALVE REPLACEMENT  2000  . APPENDECTOMY  1998  . CORONARY ARTERY BYPASS GRAFT  2000  . CORONARY STENT INTERVENTION N/A 10/23/2017   Procedure: CORONARY STENT INTERVENTION;  Surgeon: [name], MD;  Location: [facility];  Service: Cardiovascular;  Laterality: N/A;  . LEFT AND RIGHT HEART CATHETERIZATION WITH CORONARY ANGIOGRAM N/A 07/19/2013   Procedure: LEFT AND RIGHT HEART CATHETERIZATION WITH CORONARY ANGIOGRAM;  Surgeon: [name], MD;  Location: [facility] CATH LAB;  Service: Cardiovascular;  Laterality: N/A;  . RIGHT HEART CATH AND CORONARY/GRAFT ANGIOGRAPHY N/A 10/23/2017   Procedure: RIGHT HEART CATH AND CORONARY/GRAFT ANGIOGRAPHY;  Surgeon: [name], MD;  Location: [facility];  Service: Cardiovascular;  Laterality: N/A;  . TEE WITHOUT CARDIOVERSION N/A 07/22/2013  Procedure: TRANSESOPHAGEAL ECHOCARDIOGRAM (TEE);  Surgeon: [name], MD;  Location[facility] ENDOSCOPY;  Service: Cardiovascular;  Laterality: N/A;    reports that he has never smoked. He has never used smokeless tobacco. He reports that he does not drink alcohol or use drugs. family history includes Coronary artery disease in his mother; Coronary artery disease (age of onset: 42) in his other; Diabetes in his brother; Hypertension in his mother. Allergies  Allergen Reactions  . Diltiazem Hives  . Insulin Glargine Swelling    edema   Current Outpatient Medications on File Prior to Visit  Medication Sig Dispense Refill  . amLODipine  (NORVASC) 10 MG tablet TAKE 1 TABLET(10 MG) BY MOUTH DAILY 90 tablet 1  . atorvastatin (LIPITOR) 40 MG tablet Take 1 tablet (40 mg total) by mouth daily at 6 PM. 90 tablet 3  . cholecalciferol (VITAMIN D) 1000 UNITS tablet Take 1,000 Units by mouth daily.    . clopidogrel (PLAVIX) 75 MG tablet Take 1 tablet (75 mg total) by mouth daily. 90 tablet 3  . furosemide (LASIX) 40 MG tablet Take 1 tablet (40 mg total) by mouth 2 (two) times daily. 180 tablet 3  . glimepiride (AMARYL) 1 MG tablet TAKE 3 TABLETS ONE TIME DAILY WITH BREAKFAST (Patient taking differently: TAKE 1 TABLET (1 MG) BY MOUTH DAILY) 270 tablet 3  . levothyroxine (SYNTHROID, LEVOTHROID) 50 MCG tablet TAKE 1 TABLET EVERY DAY 90 tablet 3  . losartan (COZAAR) 100 MG tablet TAKE 1 TABLET EVERY DAY 90 tablet 3  . metFORMIN (GLUCOPHAGE) 1000 MG tablet TAKE 1 TABLET TWICE DAILY WITH MEALS 180 tablet 1  . metoprolol succinate (TOPROL-XL) 200 MG 24 hr tablet Take 1 tablet (200 mg total) by mouth daily. Take with or immediately following a meal. 90 tablet 3  . nitroGLYCERIN (NITROSTAT) 0.4 MG SL tablet Place 1 tablet (0.4 mg total) under the tongue every 5 (five) minutes x 3 doses as needed for chest pain. 25 tablet 2  . Potassium Chloride ER 20 MEQ TBCR TAKE 1 TABLET BY MOUTH TWICE DAILY - AM AND 5PM 180 tablet 3  . Vitamin D, Ergocalciferol, (DRISDOL) 50000 units CAPS capsule Take 1 capsule (50,000 Units total) by mouth every 7 (seven) days. 12 capsule 0  . warfarin (COUMADIN) 5 MG tablet TAKE 1 1/2 TABLET (7.5 MG) BY MOUTH ON MON/WED/FRI EVENING, TAKE 1 TABLET (5 MG) ON SUN/TUE/THU/SAT EVENING, 135 tablet 1   No current facility-administered medications on file prior to visit.    Review of Systems  Constitutional: Negative for other unusual diaphoresis or sweats HENT: Negative for ear discharge or swelling Eyes: Negative for other worsening visual disturbances Respiratory: Negative for stridor or other swelling  Gastrointestinal: Negative  for worsening distension or other blood Genitourinary: Negative for retention or other urinary change Musculoskeletal: Negative for other MSK pain or swelling Skin: Negative for color change or other new lesions Neurological: Negative for worsening tremors and other numbness  Psychiatric/Behavioral: Negative for worsening agitation or other fatigue All other system neg per pt    Objective:   Physical Exam BP 136/84   Pulse 62   Temp 97.9 F (36.6 C) (Oral)   Ht 6\' 2"  (5.63 m)   Wt 292 lb (132.5 kg)   SpO2 97%   BMI 37.49 kg/m  VS noted,  Constitutional: Pt appears in NAD HENT: Head: NCAT.  Right Ear: External ear normal.  Left Ear: External ear normal.  Eyes: . Pupils are equal, round, and reactive to  light. Conjunctivae and EOM are normal Nose: without d/c or deformity Neck: Neck supple. Gross normal ROM Cardiovascular: Normal rate and regular rhythm.   Pulmonary/Chest: Effort normal and breath sounds without rales or wheezing.  Abd:  Soft, NT, ND, + BS, no organomegaly Spine nontender in the midline or bilat lumbar paravertebral Neurological: Pt is alert. At baseline orientation, motor grossly intact Skin: Skin is warm. No rashes, other new lesions, no LE edema Psychiatric: Pt behavior is normal without agitation  No other exam findings Lab Results  Component Value Date   WBC 6.2 10/26/2017   HGB 12.9 (L) 10/26/2017   HCT 38.2 (L) 10/26/2017   PLT 147 (L) 10/26/2017   GLUCOSE 157 (H) 10/24/2017   CHOL 139 10/22/2017   TRIG 95 10/22/2017   HDL 33 (L) 10/22/2017   LDLDIRECT 88.0 02/28/2017   LDLCALC 87 10/22/2017   ALT 30 08/31/2017   AST 25 08/31/2017   NA 138 10/24/2017   K 3.6 10/24/2017   CL 105 10/24/2017   CREATININE 1.12 10/24/2017   BUN 13 10/24/2017   CO2 23 10/24/2017   TSH 3.69 08/31/2017   PSA 0.97 08/31/2017   INR 3.7 (A) 02/26/2018   HGBA1C 8.6 (H) 10/22/2017   MICROALBUR 3.6 (H) 08/31/2017       Assessment & Plan:

## 2018-03-07 NOTE — Assessment & Plan Note (Signed)
stable overall by history and exam, recent data reviewed with pt, and pt to continue medical treatment as before,  to f/u any worsening symptoms or concerns  

## 2018-03-07 NOTE — Patient Instructions (Signed)
Please take all new medication as prescribed - the pain medication, and muscle relaxer if needed  Please take all new medication as prescribed  - the trulicity (and watch for any abd pains that come up to start the first week);  Please have the pharmacy call with the name of a similar medication if this is not covered  [name] continue all other medications as before, and refills have been done if requested.  Please have the pharmacy call with any other refills you may need.  Please continue your efforts at being more active, low cholesterol diet, and weight control.  Please keep your appointments with your specialists as you may have planned  Please go to the LAB in the Basement (turn left off the elevator) for the tests to be done today  You will be contacted by phone if any changes need to be made immediately.  Otherwise, you will receive a letter about your results with an explanation, but please check with MyChart first.  Please remember to sign up for MyChart if you have not done so, as this will be important to you in the future with finding out test results, communicating by private email, and scheduling acute appointments online when needed.  Please return in 6 months, or sooner if needed, with Lab testing done 3-5 days before

## 2018-03-08 ENCOUNTER — Telehealth: Payer: Self-pay

## 2018-03-08 NOTE — Telephone Encounter (Signed)
-----   Message from Biagio Borg, MD sent at 03/07/2018 12:09 PM EDT ----- Ok to contact pt - his K is mild low, and I am wondering if maybe he misses some K pills b/c the 20 meq tabs are too big?  If so, we can change to the 10 meq pills and just take more of them

## 2018-03-08 NOTE — Telephone Encounter (Signed)
Pt has been informed of results and expressed understanding.  He states that he has not been taking at all. He stated he forgot about taking it but will start back.

## 2018-03-15 ENCOUNTER — Ambulatory Visit (INDEPENDENT_AMBULATORY_CARE_PROVIDER_SITE_OTHER): Payer: Medicare HMO | Admitting: *Deleted

## 2018-03-15 DIAGNOSIS — Z5181 Encounter for therapeutic drug level monitoring: Secondary | ICD-10-CM | POA: Diagnosis not present

## 2018-03-15 DIAGNOSIS — I480 Paroxysmal atrial fibrillation: Secondary | ICD-10-CM | POA: Diagnosis not present

## 2018-03-15 LAB — POCT INR: INR: 3.8 — AB (ref 2.0–3.0)

## 2018-03-15 NOTE — Patient Instructions (Signed)
Description   Take 1/2 tablet today, then start taking 1.5 tablets daily except 1 tablet only on Mondays,  Wednesdays and Fridays.  Next INR in 2 weeks.  Call us with any new medications or concerns [phone] [facility], [facility] [phone].

## 2018-03-19 ENCOUNTER — Encounter (HOSPITAL_COMMUNITY): Payer: Self-pay | Admitting: Emergency Medicine

## 2018-03-19 ENCOUNTER — Emergency Department (HOSPITAL_COMMUNITY)
Admission: EM | Admit: 2018-03-19 | Discharge: 2018-03-19 | Disposition: A | Discharge status: Home / Self Care | Payer: Medicare HMO | Attending: Emergency Medicine | Admitting: Emergency Medicine

## 2018-03-19 ENCOUNTER — Other Ambulatory Visit: Payer: Self-pay

## 2018-03-19 DIAGNOSIS — Z7901 Long term (current) use of anticoagulants: Secondary | ICD-10-CM | POA: Diagnosis not present

## 2018-03-19 DIAGNOSIS — Z7902 Long term (current) use of antithrombotics/antiplatelets: Secondary | ICD-10-CM | POA: Insufficient documentation

## 2018-03-19 DIAGNOSIS — Y93B1 Activity, exercise machines primarily for muscle strengthening: Secondary | ICD-10-CM | POA: Diagnosis not present

## 2018-03-19 DIAGNOSIS — M545 Low back pain, unspecified: Secondary | ICD-10-CM

## 2018-03-19 DIAGNOSIS — Z955 Presence of coronary angioplasty implant and graft: Secondary | ICD-10-CM | POA: Diagnosis not present

## 2018-03-19 DIAGNOSIS — Z79899 Other long term (current) drug therapy: Secondary | ICD-10-CM | POA: Diagnosis not present

## 2018-03-19 DIAGNOSIS — X509XXA Other and unspecified overexertion or strenuous movements or postures, initial encounter: Secondary | ICD-10-CM | POA: Diagnosis not present

## 2018-03-19 DIAGNOSIS — Z951 Presence of aortocoronary bypass graft: Secondary | ICD-10-CM | POA: Insufficient documentation

## 2018-03-19 DIAGNOSIS — Z7984 Long term (current) use of oral hypoglycemic drugs: Secondary | ICD-10-CM | POA: Diagnosis not present

## 2018-03-19 DIAGNOSIS — R202 Paresthesia of skin: Secondary | ICD-10-CM | POA: Insufficient documentation

## 2018-03-19 DIAGNOSIS — Y9239 Other specified sports and athletic area as the place of occurrence of the external cause: Secondary | ICD-10-CM | POA: Diagnosis not present

## 2018-03-19 DIAGNOSIS — I110 Hypertensive heart disease with heart failure: Secondary | ICD-10-CM | POA: Insufficient documentation

## 2018-03-19 DIAGNOSIS — I2510 Atherosclerotic heart disease of native coronary artery without angina pectoris: Secondary | ICD-10-CM | POA: Insufficient documentation

## 2018-03-19 DIAGNOSIS — I5042 Chronic combined systolic (congestive) and diastolic (congestive) heart failure: Secondary | ICD-10-CM | POA: Insufficient documentation

## 2018-03-19 DIAGNOSIS — E119 Type 2 diabetes mellitus without complications: Secondary | ICD-10-CM | POA: Diagnosis not present

## 2018-03-19 DIAGNOSIS — Y999 Unspecified external cause status: Secondary | ICD-10-CM | POA: Diagnosis not present

## 2018-03-19 MED ORDER — METHYLPREDNISOLONE 4 MG PO TBPK: ORAL_TABLET | ORAL | 0 refills | Status: DC

## 2018-03-19 NOTE — ED Provider Notes (Signed)
[facility] EMERGENCY DEPARTMENT Provider Note   CSN: [id number] Arrival date & time: 03/19/18  3762     History   Chief Complaint Chief Complaint  Patient presents with  . Back Pain    HPI [name] is a 52 y.o. male with history of a sending aortic dissection repaired in 2000 with aortic valve repair, CAD, cholelithiasis, diabetes mellitus, paroxysmal A. fib, TIA presents for evaluation of acute onset, constant low back pain for 2 weeks.  He states that the pain began suddenly while he was at the gym.  He states he was on the treadmill for an hour and as he stepped off of the treadmill he developed diffuse low back pain.  Pain is constant, sharp, worsens with bending/position changes and ambulation.  He states he does work out with heavy weights frequently.  He had some intermittent tingling of the left lower extremity transiently yesterday which has since resolved.  He states this pain feels nothing like what he experienced with his aortic dissection.  He states his pain also does not feel like a kidney stone.  He denies any urinary symptoms, fevers, bowel or bladder incontinence, saddle anesthesia, or history of IV drug use.  His diabetes is well controlled.  Went to his PCP on 03/07/2018 who prescribed him tramadol and Flexeril which he states has only made him sleepy but has not helped his pain significantly.  He has applied muscle rub with some improvement.  The history is provided by the patient.    Past Medical History:  Diagnosis Date  . Ascending aortic dissection ([place])    a. 2000 - with aortic valve involvement. S/p repair of aortic dissection with placement of mechanical AVR.  [name] CAD (coronary artery disease)    a. 2000 VG->PDA @ time of Ao dissection repair;  b. Cardiac CT 06/2010: no CAD, only mild plaque in prox RCA;  c. 2015 Cath: LM nl, LAD nl, LCX nl, RCA 100, VG->PDA 90 (4.5x12 Rebel BMS). d. 10/23/17 instent restenosis SVG to RCA, DES placed.  .  Cholelithiasis 08/22/2013  . Chronic diastolic CHF (congestive heart failure) ([place])    a. 03/2016 Echo: EF 55-60%, no rwma, Gr2 DD.  . Diabetes mellitus   . Dyspnea   . Gross hematuria   . H/O mechanical aortic valve replacement    a. 03/2016 Echo: AoV mean gradient 58mmHg, Valve Area (VTI) 1.36 cm^2, (Vmax) 1.22 cm^2, mod dil LA.  [name] Hyperlipidemia   . Hypertension   . NEPHROLITHIASIS, HX OF   . Obesity   . PAF (paroxysmal atrial fibrillation) ([place])    a. H/o such, with recurrence of coarse afib/flutter during ER consult 03/2012.  [name] TRANSIENT ISCHEMIC ATTACK, HX OF    a. In 2004.  [name] Unspecified vitamin D deficiency     Patient Active Problem List   Diagnosis Date Noted  . Low back pain 03/07/2018  . Obesity (BMI 30-39.9) 01/15/2018  . Acute on chronic systolic congestive heart failure ([place])   . Status post coronary artery stent placement   . Dyspnea on exertion 10/22/2017  . Anemia 08/31/2017  . Acute on chronic diastolic CHF (congestive heart failure) ([place]) 03/30/2016  . Encounter for well adult exam with abnormal findings 02/25/2016  . Screen for colon cancer 09/03/2015  . PTSD (post-traumatic stress disorder) 10/31/2013  . Depressive disorder 10/31/2013  . Hallucinations 10/31/2013  . Cholelithiasis 08/22/2013  . Hypothyroidism 08/22/2013  . Encounter for therapeutic drug monitoring 08/07/2013  . Unstable angina (  [place]) 07/25/2013  . Moderate to severe aortic stenosis- pt will need AVR revision 07/24/2013  . CAD of artery bypass graft- SVG-RCA BMS 2/271/5 07/24/2013  . Hematoma of groin 07/24/2013  . Retroperitoneal bleed Nov 2014 07/24/2013  . Acute diastolic heart failure ([place]) 07/21/2013  . Ascending aortic dissection  in 2000- s/p Bentall and MDT tilting disc AVR   . PAF (paroxysmal atrial fibrillation) ([place])   . Bladder neck obstruction 05/04/2012  . Unspecified vitamin D deficiency 08/07/2008  . Gross hematuria 08/07/2008  . FATIGUE 12/26/2007  . Hyperlipidemia  08/20/2007  . Diabetes ([place]) 05/21/2007  . Essential hypertension 05/21/2007  . NEPHROLITHIASIS, HX OF 05/21/2007    Past Surgical History:  Procedure Laterality Date  . AORTIC VALVE REPLACEMENT  2000  . APPENDECTOMY  1998  . CORONARY ARTERY BYPASS GRAFT  2000  . CORONARY STENT INTERVENTION N/A 10/23/2017   Procedure: CORONARY STENT INTERVENTION;  Surgeon: [name], MD;  Location: [facility] LAB;  Service: Cardiovascular;  Laterality: N/A;  . LEFT AND RIGHT HEART CATHETERIZATION WITH CORONARY ANGIOGRAM N/A 07/19/2013   Procedure: LEFT AND RIGHT HEART CATHETERIZATION WITH CORONARY ANGIOGRAM;  Surgeon: [name], MD;  Location: [facility] CATH LAB;  Service: Cardiovascular;  Laterality: N/A;  . RIGHT HEART CATH AND CORONARY/GRAFT ANGIOGRAPHY N/A 10/23/2017   Procedure: RIGHT HEART CATH AND CORONARY/GRAFT ANGIOGRAPHY;  Surgeon: [name], MD;  Location: [facility] CV LAB;  Service: Cardiovascular;  Laterality: N/A;  . TEE WITHOUT CARDIOVERSION N/A 07/22/2013   Procedure: TRANSESOPHAGEAL ECHOCARDIOGRAM (TEE);  Surgeon: [name], MD;  Location: [facility] ENDOSCOPY;  Service: Cardiovascular;  Laterality: N/A;        Home Medications    Prior to Admission medications   Medication Sig Start Date End Date Taking? Authorizing Provider  amLODipine (NORVASC) 10 MG tablet TAKE 1 TABLET(10 MG) BY MOUTH DAILY 01/15/18   Erlene Quan, PA-C  atorvastatin (LIPITOR) 40 MG tablet Take 1 tablet (40 mg total) by mouth daily at 6 PM. 01/15/18   Kilroy, Doreene Burke, PA-C  cholecalciferol (VITAMIN D) 1000 UNITS tablet Take 1,000 Units by mouth daily.    [provider]  clopidogrel (PLAVIX) 75 MG tablet Take 1 tablet (75 mg total) by mouth daily. 01/15/18   Erlene Quan, PA-C  cyclobenzaprine (FLEXERIL) 5 MG tablet Take 1 tablet (5 mg total) by mouth 3 (three) times daily as needed for muscle spasms. 03/07/18   Biagio Borg, MD  furosemide (LASIX) 40 MG tablet Take 1 tablet (40 mg  total) by mouth 2 (two) times daily. 01/15/18   Erlene Quan, PA-C  glimepiride (AMARYL) 1 MG tablet TAKE 3 TABLETS ONE TIME DAILY WITH BREAKFAST Patient taking differently: TAKE 1 TABLET (1 MG) BY MOUTH DAILY 03/14/17   Biagio Borg, MD  levothyroxine (SYNTHROID, LEVOTHROID) 50 MCG tablet TAKE 1 TABLET EVERY DAY 03/14/17   Biagio Borg, MD  losartan (COZAAR) 100 MG tablet TAKE 1 TABLET EVERY DAY 03/14/17   Biagio Borg, MD  metFORMIN (GLUCOPHAGE) 1000 MG tablet TAKE 1 TABLET TWICE DAILY WITH MEALS 08/23/17   Biagio Borg, MD  methylPREDNISolone (MEDROL DOSEPAK) 4 MG TBPK tablet Take as prescribed on packaging 03/19/18   Rodell Perna A, PA-C  metoprolol succinate (TOPROL-XL) 200 MG 24 hr tablet Take 1 tablet (200 mg total) by mouth daily. Take with or immediately following a meal. 10/26/17   Daune Perch, NP  nitroGLYCERIN (NITROSTAT) 0.4 MG SL tablet Place 1 tablet (  0.4 mg total) under the tongue every 5 (five) minutes x 3 doses as needed for chest pain. 07/25/13   Erlene Quan, PA-C  Potassium Chloride ER 20 MEQ TBCR TAKE 1 TABLET BY MOUTH TWICE DAILY - AM AND 5PM 01/15/18   Erlene Quan, PA-C  traMADol (ULTRAM) 50 MG tablet Take 1 tablet (50 mg total) by mouth every 6 (six) hours as needed. 03/07/18   Biagio Borg, MD  Vitamin D, Ergocalciferol, (DRISDOL) 50000 units CAPS capsule Take 1 capsule (50,000 Units total) by mouth every 7 (seven) days. 08/31/17   Biagio Borg, MD  warfarin (COUMADIN) 5 MG tablet TAKE 1 1/2 TABLET (7.5 MG) BY MOUTH ON MON/WED/FRI EVENING, TAKE 1 TABLET (5 MG) ON SUN/TUE/THU/SAT EVENING, 01/15/18   Lelon Perla, MD    Family History Family History  Problem Relation Age of Onset  . Coronary artery disease Mother   . Hypertension Mother   . Diabetes Brother   . Coronary artery disease Other 55       male 1st degree relative, CABG in his 73's (father)    Social History Social History   Tobacco Use  . Smoking status: Never Smoker  . Smokeless tobacco:  Never Used  Substance Use Topics  . Alcohol use: No    Alcohol/week: 0.0 standard drinks  . Drug use: No     Allergies   Diltiazem and Insulin glargine   Review of Systems Review of Systems  Constitutional: Negative for chills and fever.  Musculoskeletal: Positive for back pain.  Neurological: Negative for weakness.     Physical Exam Updated Vital Signs BP 132/87 (BP Location: Right Arm)   Pulse 66   Temp 98.1 F (36.7 C) (Oral)   Resp 18   SpO2 99%   Physical Exam  Constitutional: He appears well-developed and well-nourished. No distress.  HENT:  Head: Normocephalic and atraumatic.  Eyes: Conjunctivae are normal. Right eye exhibits no discharge. Left eye exhibits no discharge.  Neck: No JVD present. No tracheal deviation present.  Cardiovascular: Normal rate and intact distal pulses.  2+ radial and DP/PT pulses bilaterally, Homans sign absent bilaterally, no lower extremity edema, no palpable cords, compartments are soft   Pulmonary/Chest: Effort normal.  Abdominal: He exhibits no distension.  Musculoskeletal: He exhibits tenderness. He exhibits no edema.  Diffuse lumbar tenderness to palpation including paralumbar musculature with some spasm noted.  No deformity, crepitus, or step-off noted.  No focal tenderness.  5/5 strength of BLE major muscle groups.  Normal range of motion of the lumbar spine with pain with flexion and extension, worse with extension.  Neurological: He is alert. No sensory deficit.  Fluent speech, no facial droop, sensation intact to soft touch of extremities, normal gait, and patient able to heel walk and toe walk without difficulty.   Skin: Skin is warm and dry. No erythema.  Psychiatric: He has a normal mood and affect. His behavior is normal.  Nursing note and vitals reviewed.    ED Treatments / Results  Labs (all labs ordered are listed, but only abnormal results are displayed) Labs Reviewed - No data to  display  EKG None  Radiology No results found.  Procedures Procedures (including critical care time)  Medications Ordered in ED Medications - No data to display   Initial Impression / Assessment and Plan / ED Course  I have reviewed the triage vital signs and the nursing notes.  Pertinent labs & imaging results that were available during my  care of the patient were reviewed by me and considered in my medical decision making (see chart for details).     Patient with low back pain for 2 weeks secondary to working out at the gym.  He is afebrile, vital signs are stable.  He is nontoxic in appearance.  He is neurovascularly intact.  No red flag signs concerning for cauda equina or spinal abscess.  I have a low suspicion of dissection with normal vital signs, pain that has been ongoing for 2 weeks and is reproducible on palpation.  I would anticipate he would be much more hemodynamically unstable or ill-appearing if he did in fact have aortic dissection.  He states his symptoms feel nothing like his dissection. He declines radiographs at this time which I think is reasonable given his presentation appears more consistent with musculoskeletal back pain and I doubt acute spine fracture.  He is ambulatory without difficulty despite pain. Will discharge with Medrol Dosepak.  Advised of possible increased risk of bleeding.  He will call his Coumadin clinic for sooner follow-up and closer monitoring. Conservative therapy indicated and discussed with patient.  Discussed strict ED return precautions.  Recommend follow-up with PCP orthopedist for reevaluation of symptoms.  Pt verbalized understanding of and agreement with plan and is safe for discharge home at this time.  Final Clinical Impressions(s) / ED Diagnoses   Final diagnoses:  Acute bilateral low back pain without sciatica    ED Discharge Orders         Ordered    methylPREDNISolone (MEDROL DOSEPAK) 4 MG TBPK tablet     03/19/18 1040            Dhyana Bastone, [place] A, PA-C 03/19/18 1146    Sherwood Gambler, MD 03/19/18 [phone]

## 2018-03-19 NOTE — ED Triage Notes (Addendum)
Back pain x 2 weeks after being at gym lower back, denies incontinence , hurts to move,  Hx of heart surgery 2000 denies cp , sob

## 2018-03-19 NOTE — Discharge Instructions (Addendum)
1. Medications: Take steroid taper as prescribed with food to avoid upset stomach issues.  Do not take ibuprofen, Advil, Aleve, or Motrin while taking this medicine.  Call the Coumadin clinic and set up closer follow-up as this medication may increase your risk of bleeding.  Check your blood sugars while you are taking this medicine as it may increase your blood sugars.  If you have a significant increase in your blood sugars, stop taking this medicine and call your primary care physician for further recommendations.  You may take [phone] mg of Tylenol every 6 hours as needed for pain. Do not exceed 4000 mg of Tylenol daily.  You can take Flexeril as needed for muscle relaxation but this medication may make you drowsy so do not drive, drink alcohol, operate heavy machinery, or make important decisions while you are using this medicine.  I typically recommend only taking this medicine at night.  You can also cut these tablets in half if they are very strong. 2. Treatment: rest, ice, elevate and use brace, drink plenty of fluids, gentle stretching (see attached exercises, but you can also YouTube search physical therapy exercises).   3. Follow Up: Please followup with orthopedics as directed or your PCP in 1 week if no improvement for discussion of your diagnoses and further evaluation after today's visit; if you do not have a primary care doctor use the resource guide provided to find one; Please return to the ER for worsening symptoms or other concerns such as worsening swelling, redness of the skin, fevers, loss of pulses, or loss of feeling

## 2018-03-26 ENCOUNTER — Emergency Department (HOSPITAL_COMMUNITY)
Admission: EM | Admit: 2018-03-26 | Discharge: 2018-03-26 | Disposition: A | Discharge status: Home / Self Care | Payer: Medicare HMO | Attending: Emergency Medicine | Admitting: Emergency Medicine

## 2018-03-26 ENCOUNTER — Other Ambulatory Visit: Payer: Self-pay

## 2018-03-26 ENCOUNTER — Encounter (HOSPITAL_COMMUNITY): Payer: Self-pay | Admitting: Emergency Medicine

## 2018-03-26 DIAGNOSIS — M545 Low back pain, unspecified: Secondary | ICD-10-CM

## 2018-03-26 DIAGNOSIS — E119 Type 2 diabetes mellitus without complications: Secondary | ICD-10-CM | POA: Diagnosis not present

## 2018-03-26 DIAGNOSIS — I2510 Atherosclerotic heart disease of native coronary artery without angina pectoris: Secondary | ICD-10-CM | POA: Insufficient documentation

## 2018-03-26 DIAGNOSIS — Z7901 Long term (current) use of anticoagulants: Secondary | ICD-10-CM | POA: Insufficient documentation

## 2018-03-26 DIAGNOSIS — Z7984 Long term (current) use of oral hypoglycemic drugs: Secondary | ICD-10-CM | POA: Insufficient documentation

## 2018-03-26 DIAGNOSIS — I5032 Chronic diastolic (congestive) heart failure: Secondary | ICD-10-CM | POA: Insufficient documentation

## 2018-03-26 DIAGNOSIS — I110 Hypertensive heart disease with heart failure: Secondary | ICD-10-CM | POA: Insufficient documentation

## 2018-03-26 DIAGNOSIS — Z7902 Long term (current) use of antithrombotics/antiplatelets: Secondary | ICD-10-CM | POA: Insufficient documentation

## 2018-03-26 DIAGNOSIS — E039 Hypothyroidism, unspecified: Secondary | ICD-10-CM | POA: Insufficient documentation

## 2018-03-26 MED ORDER — ACETAMINOPHEN 325 MG PO TABS: 650.0000 mg | ORAL_TABLET | Freq: Four times a day (QID) | ORAL | 0 refills | Status: DC | PRN

## 2018-03-26 MED ORDER — PREDNISONE 20 MG PO TABS: 40.0000 mg | ORAL_TABLET | Freq: Every day | ORAL | 0 refills | Status: AC

## 2018-03-26 MED ORDER — PREDNISONE 20 MG PO TABS
60.0000 mg | ORAL_TABLET | Freq: Once | ORAL | Status: AC
  Administered 2018-03-26: 60 mg via ORAL
  Filled 2018-03-26: qty 3

## 2018-03-26 MED ORDER — KETOROLAC TROMETHAMINE 60 MG/2ML IM SOLN
60.0000 mg | Freq: Once | INTRAMUSCULAR | Status: AC
  Administered 2018-03-26: 60 mg via INTRAMUSCULAR
  Filled 2018-03-26: qty 2

## 2018-03-26 NOTE — ED Triage Notes (Signed)
Pt to ER for evaluation of lower back pain without radiation, worsened by movement, denies urinary/bowel incontinence. Ambulatory.

## 2018-03-26 NOTE — ED Provider Notes (Signed)
[facility] EMERGENCY DEPARTMENT Provider Note   CSN: [id number] Arrival date & time: 03/26/18  0735     History   Chief Complaint Chief Complaint  Patient presents with  . Back Pain    HPI [name] is a 52 y.o. male.  HPI 52 year old male presents the emergency department ongoing low back pain over the past 3 weeks.  Denies fevers and chills.  Denies weakness of his lower extremities.  States this occurred while at the gym doing the elliptical.  He is tried muscle relaxants without improvement in symptoms.  Denies paresthesias.  Denies midline tenderness.  Reports the pain is across his low back.  No significant radiation down into his legs.  No history of sciatica.  No other recent trauma.  Denies abdominal pain.  History of aortic dissection status post repair.  No chest pain or shortness of breath.  No upper back pain.   Past Medical History:  Diagnosis Date  . Ascending aortic dissection ([place])    a. 2000 - with aortic valve involvement. S/p repair of aortic dissection with placement of mechanical AVR.  [name] CAD (coronary artery disease)    a. 2000 VG->PDA @ time of Ao dissection repair;  b. Cardiac CT 06/2010: no CAD, only mild plaque in prox RCA;  c. 2015 Cath: LM nl, LAD nl, LCX nl, RCA 100, VG->PDA 90 (4.5x12 Rebel BMS). d. 10/23/17 instent restenosis SVG to RCA, DES placed.  . Cholelithiasis 08/22/2013  . Chronic diastolic CHF (congestive heart failure) ([place])    a. 03/2016 Echo: EF 55-60%, no rwma, Gr2 DD.  . Diabetes mellitus   . Dyspnea   . Gross hematuria   . H/O mechanical aortic valve replacement    a. 03/2016 Echo: AoV mean gradient 2mmHg, Valve Area (VTI) 1.36 cm^2, (Vmax) 1.22 cm^2, mod dil LA.  [name] Hyperlipidemia   . Hypertension   . NEPHROLITHIASIS, HX OF   . Obesity   . PAF (paroxysmal atrial fibrillation) ([place])    a. H/o such, with recurrence of coarse afib/flutter during ER consult 03/2012.  [name] TRANSIENT ISCHEMIC ATTACK, HX OF    a. In  2004.  [name] Unspecified vitamin D deficiency     Patient Active Problem List   Diagnosis Date Noted  . Low back pain 03/07/2018  . Obesity (BMI 30-39.9) 01/15/2018  . Acute on chronic systolic congestive heart failure ([place])   . Status post coronary artery stent placement   . Dyspnea on exertion 10/22/2017  . Anemia 08/31/2017  . Acute on chronic diastolic CHF (congestive heart failure) ([place]) 03/30/2016  . Encounter for well adult exam with abnormal findings 02/25/2016  . Screen for colon cancer 09/03/2015  . PTSD (post-traumatic stress disorder) 10/31/2013  . Depressive disorder 10/31/2013  . Hallucinations 10/31/2013  . Cholelithiasis 08/22/2013  . Hypothyroidism 08/22/2013  . Encounter for therapeutic drug monitoring 08/07/2013  . Unstable angina ([place]) 07/25/2013  . Moderate to severe aortic stenosis- pt will need AVR revision 07/24/2013  . CAD of artery bypass graft- SVG-RCA BMS 2/271/5 07/24/2013  . Hematoma of groin 07/24/2013  . Retroperitoneal bleed Nov 2014 07/24/2013  . Acute diastolic heart failure ([place]) 07/21/2013  . Ascending aortic dissection  in 2000- s/p Bentall and MDT tilting disc AVR   . PAF (paroxysmal atrial fibrillation) ([place])   . Bladder neck obstruction 05/04/2012  . Unspecified vitamin D deficiency 08/07/2008  . Gross hematuria 08/07/2008  . FATIGUE 12/26/2007  . Hyperlipidemia 08/20/2007  . Diabetes ([place]) 05/21/2007  .  Essential hypertension 05/21/2007  . NEPHROLITHIASIS, HX OF 05/21/2007    Past Surgical History:  Procedure Laterality Date  . AORTIC VALVE REPLACEMENT  2000  . APPENDECTOMY  1998  . CORONARY ARTERY BYPASS GRAFT  2000  . CORONARY STENT INTERVENTION N/A 10/23/2017   Procedure: CORONARY STENT INTERVENTION;  Surgeon: [name], MD;  Location: [facility] CV LAB;  Service: Cardiovascular;  Laterality: N/A;  . LEFT AND RIGHT HEART CATHETERIZATION WITH CORONARY ANGIOGRAM N/A 07/19/2013   Procedure: LEFT AND RIGHT HEART  CATHETERIZATION WITH CORONARY ANGIOGRAM;  Surgeon: [name], MD;  Location: [facility] CATH LAB;  Service: Cardiovascular;  Laterality: N/A;  . RIGHT HEART CATH AND CORONARY/GRAFT ANGIOGRAPHY N/A 10/23/2017   Procedure: RIGHT HEART CATH AND CORONARY/GRAFT ANGIOGRAPHY;  Surgeon: [name], MD;  Location: [facility] CV LAB;  Service: Cardiovascular;  Laterality: N/A;  . TEE WITHOUT CARDIOVERSION N/A 07/22/2013   Procedure: TRANSESOPHAGEAL ECHOCARDIOGRAM (TEE);  Surgeon: [name], MD;  Location: [facility] ENDOSCOPY;  Service: Cardiovascular;  Laterality: N/A;        Home Medications    Prior to Admission medications   Medication Sig Start Date End Date Taking? Authorizing Provider  acetaminophen (TYLENOL) 325 MG tablet Take 2 tablets (650 mg total) by mouth every 6 (six) hours as needed for mild pain or moderate pain. 03/26/18   Jola Schmidt, MD  amLODipine (NORVASC) 10 MG tablet TAKE 1 TABLET(10 MG) BY MOUTH DAILY 01/15/18   Erlene Quan, PA-C  atorvastatin (LIPITOR) 40 MG tablet Take 1 tablet (40 mg total) by mouth daily at 6 PM. 01/15/18   Kilroy, Doreene Burke, PA-C  cholecalciferol (VITAMIN D) 1000 UNITS tablet Take 1,000 Units by mouth daily.    [provider]  clopidogrel (PLAVIX) 75 MG tablet Take 1 tablet (75 mg total) by mouth daily. 01/15/18   Erlene Quan, PA-C  cyclobenzaprine (FLEXERIL) 5 MG tablet Take 1 tablet (5 mg total) by mouth 3 (three) times daily as needed for muscle spasms. 03/07/18   Biagio Borg, MD  furosemide (LASIX) 40 MG tablet Take 1 tablet (40 mg total) by mouth 2 (two) times daily. 01/15/18   Erlene Quan, PA-C  glimepiride (AMARYL) 1 MG tablet TAKE 3 TABLETS ONE TIME DAILY WITH BREAKFAST Patient taking differently: TAKE 1 TABLET (1 MG) BY MOUTH DAILY 03/14/17   Biagio Borg, MD  levothyroxine (SYNTHROID, LEVOTHROID) 50 MCG tablet TAKE 1 TABLET EVERY DAY 03/14/17   Biagio Borg, MD  losartan (COZAAR) 100 MG tablet TAKE 1 TABLET EVERY DAY  03/14/17   Biagio Borg, MD  metFORMIN (GLUCOPHAGE) 1000 MG tablet TAKE 1 TABLET TWICE DAILY WITH MEALS 08/23/17   Biagio Borg, MD  methylPREDNISolone (MEDROL DOSEPAK) 4 MG TBPK tablet Take as prescribed on packaging 03/19/18   Rodell Perna A, PA-C  metoprolol succinate (TOPROL-XL) 200 MG 24 hr tablet Take 1 tablet (200 mg total) by mouth daily. Take with or immediately following a meal. 10/26/17   Daune Perch, NP  nitroGLYCERIN (NITROSTAT) 0.4 MG SL tablet Place 1 tablet (0.4 mg total) under the tongue every 5 (five) minutes x 3 doses as needed for chest pain. 07/25/13   Erlene Quan, PA-C  Potassium Chloride ER 20 MEQ TBCR TAKE 1 TABLET BY MOUTH TWICE DAILY - AM AND 5PM 01/15/18   Erlene Quan, PA-C  predniSONE (DELTASONE) 20 MG tablet Take 2 tablets (40 mg total) by mouth daily for 5 days. 03/26/18 03/31/18  Zakya Halabi,  Lennette Bihari, MD  traMADol (ULTRAM) 50 MG tablet Take 1 tablet (50 mg total) by mouth every 6 (six) hours as needed. 03/07/18   Biagio Borg, MD  Vitamin D, Ergocalciferol, (DRISDOL) 50000 units CAPS capsule Take 1 capsule (50,000 Units total) by mouth every 7 (seven) days. 08/31/17   Biagio Borg, MD  warfarin (COUMADIN) 5 MG tablet TAKE 1 1/2 TABLET (7.5 MG) BY MOUTH ON MON/WED/FRI EVENING, TAKE 1 TABLET (5 MG) ON SUN/TUE/THU/SAT EVENING, 01/15/18   Lelon Perla, MD    Family History Family History  Problem Relation Age of Onset  . Coronary artery disease Mother   . Hypertension Mother   . Diabetes Brother   . Coronary artery disease Other 91       male 1st degree relative, CABG in his 5's (father)    Social History Social History   Tobacco Use  . Smoking status: Never Smoker  . Smokeless tobacco: Never Used  Substance Use Topics  . Alcohol use: No    Alcohol/week: 0.0 standard drinks  . Drug use: No     Allergies   Diltiazem and Insulin glargine   Review of Systems Review of Systems  All other systems reviewed and are negative.    Physical Exam Updated  Vital Signs BP 125/83 (BP Location: Right Arm)   Pulse 64   Temp 98 F (36.7 C) (Oral)   Resp 16   Ht 6\' 2"  (1.88 m)   Wt 120.2 kg   SpO2 100%   BMI 34.02 kg/m   Physical Exam  Constitutional: He is oriented to person, place, and time. He appears well-developed and well-nourished.  HENT:  Head: Normocephalic and atraumatic.  Eyes: EOM are normal.  Neck: Normal range of motion.  Cardiovascular: Normal rate and regular rhythm.  Pulmonary/Chest: Effort normal.  Abdominal: Soft. He exhibits no distension. There is no tenderness.  Musculoskeletal: Normal range of motion.  5 out of 5 strength in bilateral lower extremity major muscle groups.  Full range of motion of major lower extremity joints.  No thoracic or lumbar point tenderness.  Paralumbar tenderness bilaterally.  No significant spasm.  Neurological: He is alert and oriented to person, place, and time.  Skin: Skin is warm and dry.  Psychiatric: He has a normal mood and affect. Judgment normal.  Nursing note and vitals reviewed.    ED Treatments / Results  Labs (all labs ordered are listed, but only abnormal results are displayed) Labs Reviewed - No data to display  EKG None  Radiology No results found.  Procedures Procedures (including critical care time)  Medications Ordered in ED Medications  ketorolac (TORADOL) injection 60 mg (has no administration in time range)  predniSONE (DELTASONE) tablet 60 mg (60 mg Oral Given 03/26/18 0916)     Initial Impression / Assessment and Plan / ED Course  I have reviewed the triage vital signs and the nursing notes.  Pertinent labs & imaging results that were available during my care of the patient were reviewed by me and considered in my medical decision making (see chart for details).     Suspect musculoskeletal low back pain.  We will add steroids and Tylenol.  Primary care follow-up.  May benefit from MRI as an outpatient if his symptoms do not improve.  Outpatient  physical therapy may be beneficial as well.  Patient encouraged to return to the emergency department for new or worsening symptoms  Final Clinical Impressions(s) / ED Diagnoses   Final diagnoses:  Acute bilateral low back pain without sciatica    ED Discharge Orders         Ordered    predniSONE (DELTASONE) 20 MG tablet  Daily     03/26/18 0914    acetaminophen (TYLENOL) 325 MG tablet  Every 6 hours PRN     03/26/18 6295           Jola Schmidt, MD 03/26/18 [phone]

## 2018-04-03 ENCOUNTER — Ambulatory Visit (INDEPENDENT_AMBULATORY_CARE_PROVIDER_SITE_OTHER): Payer: Medicare HMO | Admitting: *Deleted

## 2018-04-03 DIAGNOSIS — Z5181 Encounter for therapeutic drug level monitoring: Secondary | ICD-10-CM | POA: Diagnosis not present

## 2018-04-03 DIAGNOSIS — I480 Paroxysmal atrial fibrillation: Secondary | ICD-10-CM

## 2018-04-03 LAB — POCT INR: INR: 4.3 — AB (ref 2.0–3.0)

## 2018-04-03 NOTE — Patient Instructions (Signed)
Description   Do not take any Coumadin then start taking 1 tablet daily except 1.5 tablets on Tuesday, Thursday, and Saturday.  Next INR in 2 weeks.  Call us with any new medications or concerns [phone] [facility] [phone].

## 2018-04-05 ENCOUNTER — Ambulatory Visit (INDEPENDENT_AMBULATORY_CARE_PROVIDER_SITE_OTHER): Payer: Medicare HMO | Admitting: Internal Medicine

## 2018-04-05 ENCOUNTER — Encounter: Payer: Self-pay | Admitting: Internal Medicine

## 2018-04-05 VITALS — BP 126/86 | HR 66 | Temp 98.4°F | Ht 74.0 in | Wt 291.0 lb

## 2018-04-05 DIAGNOSIS — M545 Low back pain, unspecified: Secondary | ICD-10-CM

## 2018-04-05 DIAGNOSIS — E119 Type 2 diabetes mellitus without complications: Secondary | ICD-10-CM | POA: Diagnosis not present

## 2018-04-05 DIAGNOSIS — I10 Essential (primary) hypertension: Secondary | ICD-10-CM | POA: Diagnosis not present

## 2018-04-05 MED ORDER — GABAPENTIN 100 MG PO CAPS: 100.0000 mg | ORAL_CAPSULE | Freq: Three times a day (TID) | ORAL | 3 refills | Status: DC

## 2018-04-05 NOTE — Assessment & Plan Note (Signed)
stable overall by history and exam, recent data reviewed with pt, and pt to continue medical treatment as before,  to f/u any worsening symptoms or concerns  

## 2018-04-05 NOTE — Patient Instructions (Signed)
Please take all new medication as prescribed - the gabapentin 100 mg three times per day  You will be contacted regarding the referral for: MRI, and orthopedic  Please continue all other medications as before, and refills have been done if requested.  Please have the pharmacy call with any other refills you may need.  Please continue your efforts at being more active, low cholesterol diet, and weight control.  Please keep your appointments with your specialists as you may have planned

## 2018-04-05 NOTE — Progress Notes (Signed)
Subjective:    Patient ID: [name], male    DOB: [birth year], 52 y.o.   MRN: [id number]  HPI  Here after seen in ED with c/o 3 wks lbp, sharp, mod to severe, constant but no bowel or bladder change, fever, wt loss,  worsening LE pain/numbness/weakness, gait change or falls.  May have started after using the elliptical at the gym.  Pt denies chest pain, increased sob or doe, wheezing, orthopnea, PND, increased LE swelling, palpitations, dizziness or syncope.   Pt denies polydipsia, polyuria.  Pt denies new neurological symptoms such as new headache, or facial or extremity weakness or numbness .  Was tx with prednisone and muscle relaxer and to f/u here.  States meds not helping and maybe even some worse, no 8/10  Past Medical History:  Diagnosis Date  . Ascending aortic dissection ([place])    a. 2000 - with aortic valve involvement. S/p repair of aortic dissection with placement of mechanical AVR.  [name] CAD (coronary artery disease)    a. 2000 VG->PDA @ time of Ao dissection repair;  b. Cardiac CT 06/2010: no CAD, only mild plaque in prox RCA;  c. 2015 Cath: LM nl, LAD nl, LCX nl, RCA 100, VG->PDA 90 (4.5x12 Rebel BMS). d. 10/23/17 instent restenosis SVG to RCA, DES placed.  . Cholelithiasis 08/22/2013  . Chronic diastolic CHF (congestive heart failure) ([place])    a. 03/2016 Echo: EF 55-60%, no rwma, Gr2 DD.  . Diabetes mellitus   . Dyspnea   . Gross hematuria   . H/O mechanical aortic valve replacement    a. 03/2016 Echo: AoV mean gradient 26mmHg, Valve Area (VTI) 1.36 cm^2, (Vmax) 1.22 cm^2, mod dil LA.  [name] Hyperlipidemia   . Hypertension   . NEPHROLITHIASIS, HX OF   . Obesity   . PAF (paroxysmal atrial fibrillation) ([place])    a. H/o such, with recurrence of coarse afib/flutter during ER consult 03/2012.  [name] TRANSIENT ISCHEMIC ATTACK, HX OF    a. In 2004.  [name] Unspecified vitamin D deficiency    Past Surgical History:  Procedure Laterality Date  . AORTIC VALVE REPLACEMENT  2000  .  APPENDECTOMY  1998  . CORONARY ARTERY BYPASS GRAFT  2000  . CORONARY STENT INTERVENTION N/A 10/23/2017   Procedure: CORONARY STENT INTERVENTION;  Surgeon: [name], MD;  Location: [facility];  Service: Cardiovascular;  Laterality: N/A;  . LEFT AND RIGHT HEART CATHETERIZATION WITH CORONARY ANGIOGRAM N/A 07/19/2013   Procedure: LEFT AND RIGHT HEART CATHETERIZATION WITH CORONARY ANGIOGRAM;  Surgeon: [name], MD;  Location: [facility] CATH LAB;  Service: Cardiovascular;  Laterality: N/A;  . RIGHT HEART CATH AND CORONARY/GRAFT ANGIOGRAPHY N/A 10/23/2017   Procedure: RIGHT HEART CATH AND CORONARY/GRAFT ANGIOGRAPHY;  Surgeon: [name], MD;  Location: [facility];  Service: Cardiovascular;  Laterality: N/A;  . TEE WITHOUT CARDIOVERSION N/A 07/22/2013   Procedure: TRANSESOPHAGEAL ECHOCARDIOGRAM (TEE);  Surgeon: [name], MD;  Location: [facility] ENDOSCOPY;  Service: Cardiovascular;  Laterality: N/A;    reports that he has never smoked. He has never used smokeless tobacco. He reports that he does not drink alcohol or use drugs. family history includes Coronary artery disease in his mother; Coronary artery disease (age of onset: 62) in his other; Diabetes in his brother; Hypertension in his mother. Allergies  Allergen Reactions  . Diltiazem Hives  . Insulin Glargine Swelling    edema   Current Outpatient Medications on File Prior to Visit  Medication  Sig Dispense Refill  . acetaminophen (TYLENOL) 325 MG tablet Take 2 tablets (650 mg total) by mouth every 6 (six) hours as needed for mild pain or moderate pain. 30 tablet 0  . amLODipine (NORVASC) 10 MG tablet TAKE 1 TABLET(10 MG) BY MOUTH DAILY 90 tablet 1  . atorvastatin (LIPITOR) 40 MG tablet Take 1 tablet (40 mg total) by mouth daily at 6 PM. 90 tablet 3  . cholecalciferol (VITAMIN D) 1000 UNITS tablet Take 1,000 Units by mouth daily.    . clopidogrel (PLAVIX) 75 MG tablet Take 1 tablet (75 mg total) by mouth daily.  90 tablet 3  . cyclobenzaprine (FLEXERIL) 5 MG tablet Take 1 tablet (5 mg total) by mouth 3 (three) times daily as needed for muscle spasms. 60 tablet 1  . furosemide (LASIX) 40 MG tablet Take 1 tablet (40 mg total) by mouth 2 (two) times daily. 180 tablet 3  . glimepiride (AMARYL) 1 MG tablet TAKE 3 TABLETS ONE TIME DAILY WITH BREAKFAST (Patient taking differently: TAKE 1 TABLET (1 MG) BY MOUTH DAILY) 270 tablet 3  . levothyroxine (SYNTHROID, LEVOTHROID) 50 MCG tablet TAKE 1 TABLET EVERY DAY 90 tablet 3  . losartan (COZAAR) 100 MG tablet TAKE 1 TABLET EVERY DAY 90 tablet 3  . metFORMIN (GLUCOPHAGE) 1000 MG tablet TAKE 1 TABLET TWICE DAILY WITH MEALS 180 tablet 1  . methylPREDNISolone (MEDROL DOSEPAK) 4 MG TBPK tablet Take as prescribed on packaging 21 tablet 0  . metoprolol succinate (TOPROL-XL) 200 MG 24 hr tablet Take 1 tablet (200 mg total) by mouth daily. Take with or immediately following a meal. 90 tablet 3  . nitroGLYCERIN (NITROSTAT) 0.4 MG SL tablet Place 1 tablet (0.4 mg total) under the tongue every 5 (five) minutes x 3 doses as needed for chest pain. 25 tablet 2  . Potassium Chloride ER 20 MEQ TBCR TAKE 1 TABLET BY MOUTH TWICE DAILY - AM AND 5PM 180 tablet 3  . traMADol (ULTRAM) 50 MG tablet Take 1 tablet (50 mg total) by mouth every 6 (six) hours as needed. 30 tablet 0  . Vitamin D, Ergocalciferol, (DRISDOL) 50000 units CAPS capsule Take 1 capsule (50,000 Units total) by mouth every 7 (seven) days. 12 capsule 0  . warfarin (COUMADIN) 5 MG tablet TAKE 1 1/2 TABLET (7.5 MG) BY MOUTH ON MON/WED/FRI EVENING, TAKE 1 TABLET (5 MG) ON SUN/TUE/THU/SAT EVENING, 135 tablet 1   No current facility-administered medications on file prior to visit.    Review of Systems  Constitutional: Negative for other unusual diaphoresis or sweats HENT: Negative for ear discharge or swelling Eyes: Negative for other worsening visual disturbances Respiratory: Negative for stridor or other swelling    Gastrointestinal: Negative for worsening distension or other blood Genitourinary: Negative for retention or other urinary change Musculoskeletal: Negative for other MSK pain or swelling Skin: Negative for color change or other new lesions Neurological: Negative for worsening tremors and other numbness  Psychiatric/Behavioral: Negative for worsening agitation or other fatigue All other system neg per pt    Objective:   Physical Exam BP 126/86   Pulse 66   Temp 98.4 F (36.9 C) (Oral)   Ht 6\' 2"  (1.88 m)   Wt 291 lb (132 kg)   SpO2 96%   BMI 37.36 kg/m  VS noted,  Constitutional: Pt appears in NAD HENT: Head: NCAT.  Right Ear: External ear normal.  Left Ear: External ear normal.  Eyes: . Pupils are equal, round, and reactive to light. Conjunctivae and  EOM are normal Nose: without d/c or deformity Neck: Neck supple. Gross normal ROM Cardiovascular: Normal rate and regular rhythm.   Pulmonary/Chest: Effort normal and breath sounds without rales or wheezing.  Abd:  Soft, NT, ND, + BS, no organomegaly Spine mod tender lower lumbar midline and right lumbar paravertebral and buttock upper Neurological: Pt is alert. At baseline orientation, motor grossly intact Skin: Skin is warm. No rashes, other new lesions, no LE edema Psychiatric: Pt behavior is normal without agitation  No other exam findings Lab Results  Component Value Date   WBC 6.2 10/26/2017   HGB 12.9 (L) 10/26/2017   HCT 38.2 (L) 10/26/2017   PLT 147 (L) 10/26/2017   GLUCOSE 131 (H) 03/07/2018   CHOL 98 03/07/2018   TRIG 125.0 03/07/2018   HDL 25.30 (L) 03/07/2018   LDLDIRECT 88.0 02/28/2017   LDLCALC 48 03/07/2018   ALT 13 03/07/2018   AST 20 03/07/2018   NA 140 03/07/2018   K 3.2 (L) 03/07/2018   CL 104 03/07/2018   CREATININE 0.97 03/07/2018   BUN 14 03/07/2018   CO2 29 03/07/2018   TSH 3.69 08/31/2017   PSA 0.97 08/31/2017   INR 4.3 (A) 04/03/2018   HGBA1C 6.2 03/07/2018   MICROALBUR 3.6 (H)  08/31/2017       Assessment & Plan:

## 2018-04-05 NOTE — Assessment & Plan Note (Signed)
Acute onset now 4 wks without improvement, no neuro changes, for trial gabapentin 100 tid, MRI LS spine, and ortho referral

## 2018-04-09 ENCOUNTER — Other Ambulatory Visit: Payer: Self-pay | Admitting: Internal Medicine

## 2018-04-16 ENCOUNTER — Ambulatory Visit (INDEPENDENT_AMBULATORY_CARE_PROVIDER_SITE_OTHER): Payer: Medicare HMO | Admitting: *Deleted

## 2018-04-16 DIAGNOSIS — I480 Paroxysmal atrial fibrillation: Secondary | ICD-10-CM | POA: Diagnosis not present

## 2018-04-16 DIAGNOSIS — Z5181 Encounter for therapeutic drug level monitoring: Secondary | ICD-10-CM

## 2018-04-16 LAB — POCT INR: INR: 3.5 — AB (ref 2.0–3.0)

## 2018-04-16 NOTE — Patient Instructions (Signed)
Description   Continue taking 1 tablet daily except 1.5 tablets on Tuesday, Thursday, and Saturday.  Next INR in 2 weeks.  Call us with any new medications or concerns [phone] [facility] [phone].

## 2018-04-24 ENCOUNTER — Ambulatory Visit
Admission: RE | Admit: 2018-04-24 | Discharge: 2018-04-24 | Disposition: A | Discharge status: Home / Self Care | Payer: Medicare HMO | Source: Ambulatory Visit | Attending: Internal Medicine | Admitting: Internal Medicine

## 2018-04-24 DIAGNOSIS — M545 Low back pain, unspecified: Secondary | ICD-10-CM

## 2018-04-24 DIAGNOSIS — M48061 Spinal stenosis, lumbar region without neurogenic claudication: Secondary | ICD-10-CM | POA: Diagnosis not present

## 2018-04-24 IMAGING — MR MR LUMBAR SPINE W/O CM
4 of 5 series · 18 of 48 positions shown · non-contrast
Comparison: CT abdomen and pelvis July 24, 2013

CLINICAL DATA: Low back pain radiating to LEFT lower extremity.
Difficulty ambulating for 1 month.

EXAM:
MRI LUMBAR SPINE WITHOUT CONTRAST
TECHNIQUE: Multiplanar, multisequence MR imaging of the lumbar spine was
performed. No intravenous contrast was administered.

[Series 6: T2 · sagittal · 4.0mm · 0.73mm/px · 6 of 16 slices shown (1 of 2)]
[im 1/16]
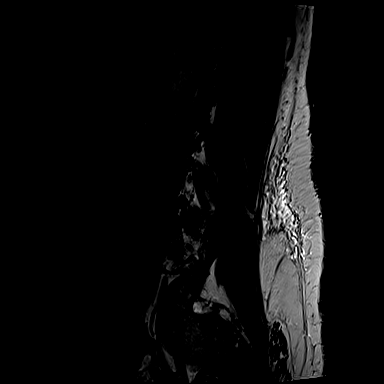
[im 4/16]
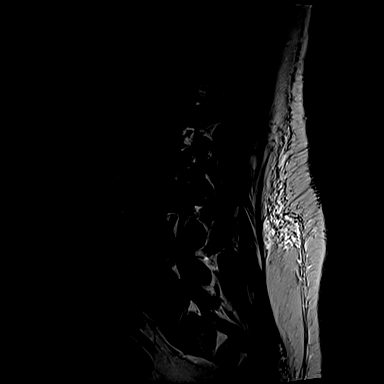
[im 7/16]
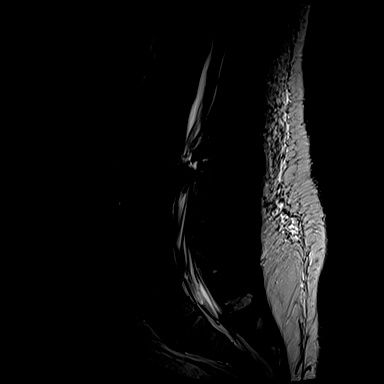
[im 10/16]
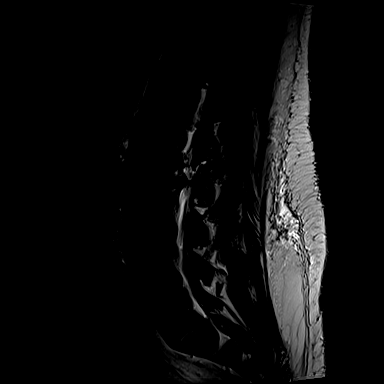
[im 13/16]
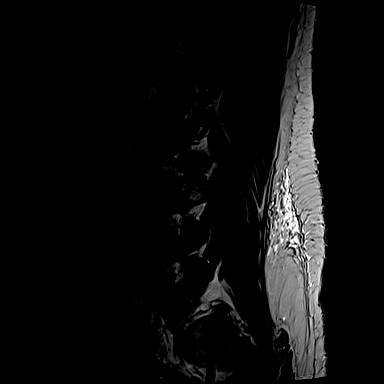
[im 16/16]
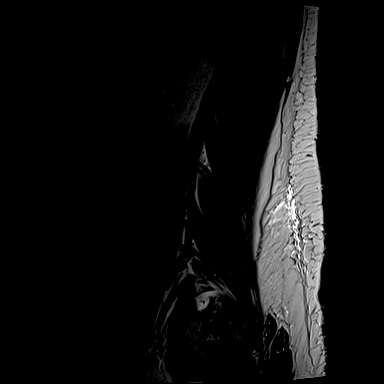

[Series 7: T1 · sagittal · 4.0mm · 0.73mm/px · 3 of 16 slices shown (1 of 2)]
[im 4/16]
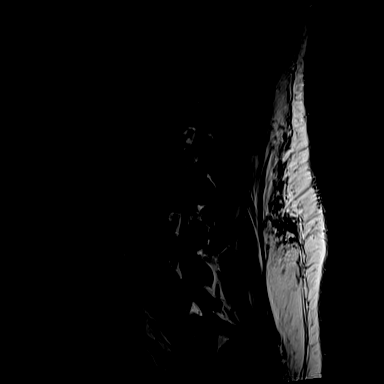
[im 10/16]
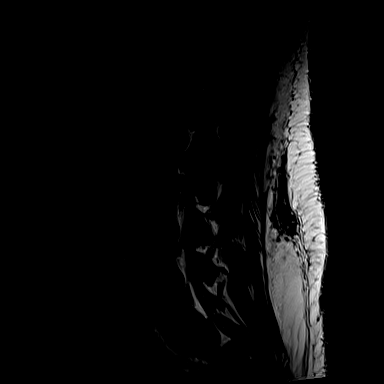
[im 16/16]
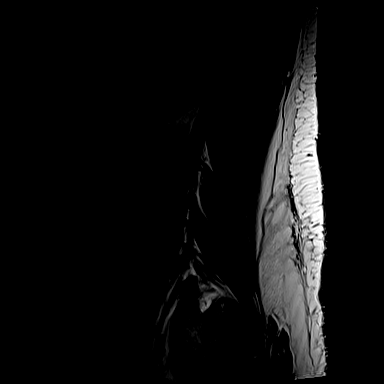

[Series 11: T1 · axial · 4.0mm · 0.28mm/px · z∈[-64,+99]mm · 3 of 39 slices shown (2 of 2)]
[im 6/39]
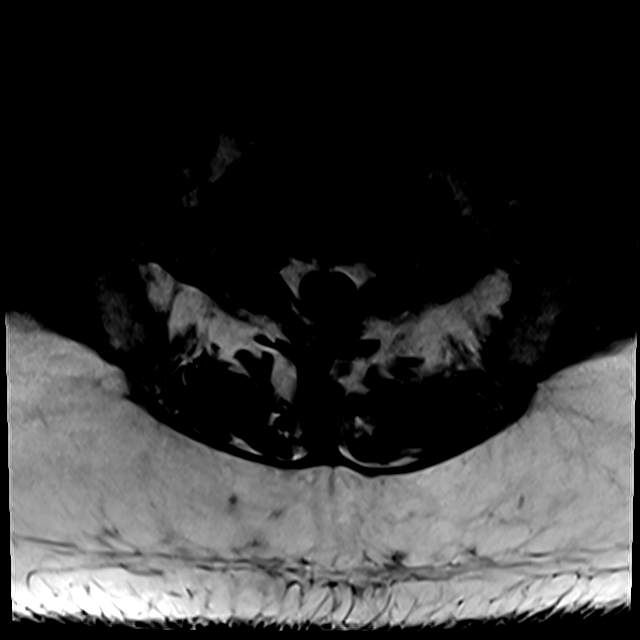
[im 20/39]
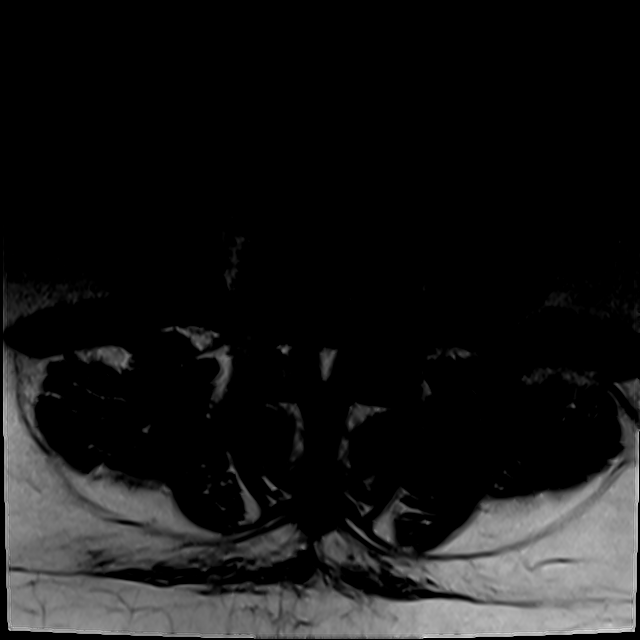
[im 33/39]
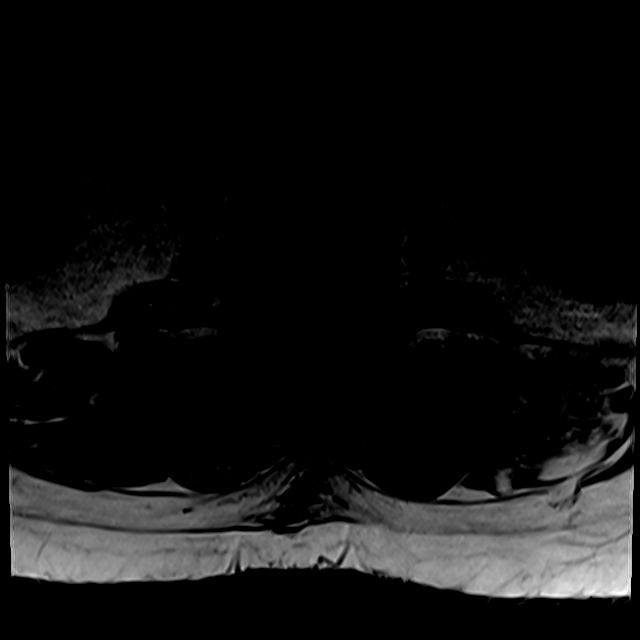

[Series 14: T2 · axial · 4.0mm · 0.28mm/px · z∈[-88,+99]mm · 6 of 39 slices shown (2 of 2)]
[im 1/39]
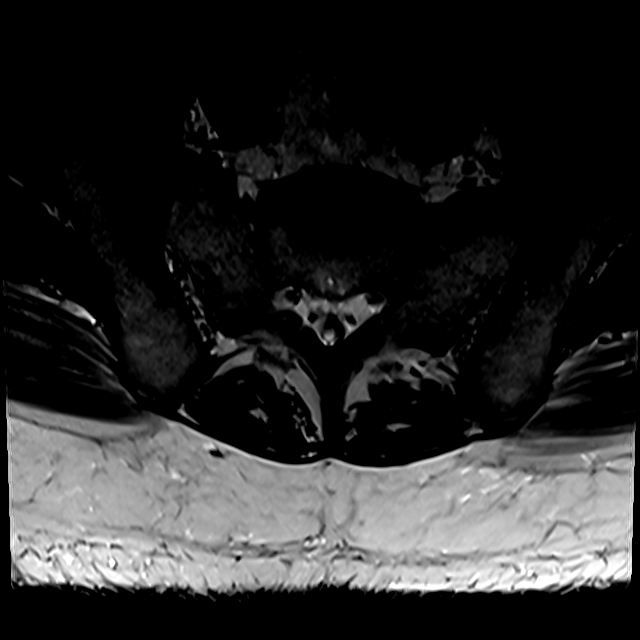
[im 6/39]
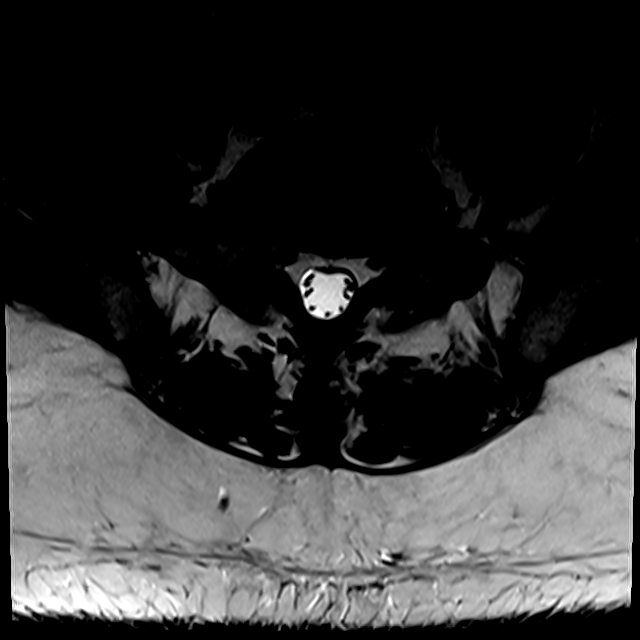
[im 11/39]
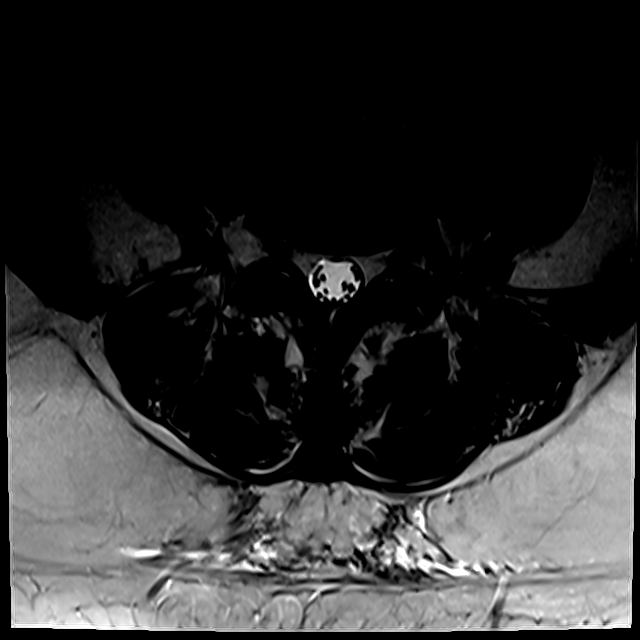
[im 17/39]
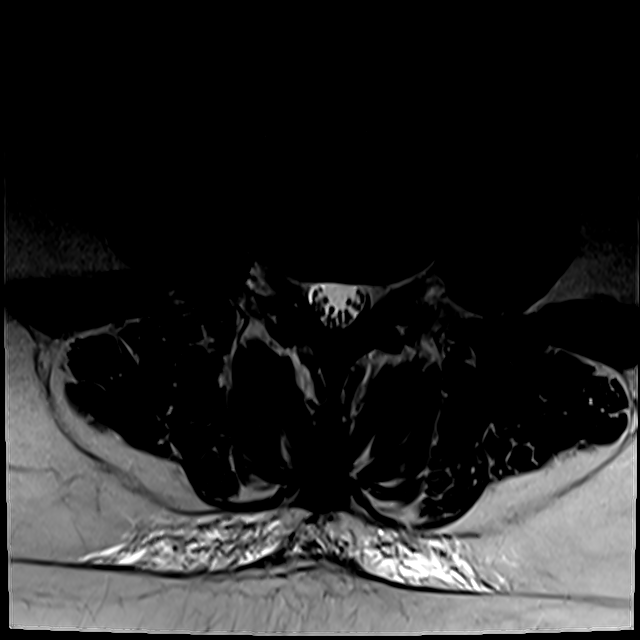
[im 20/39]
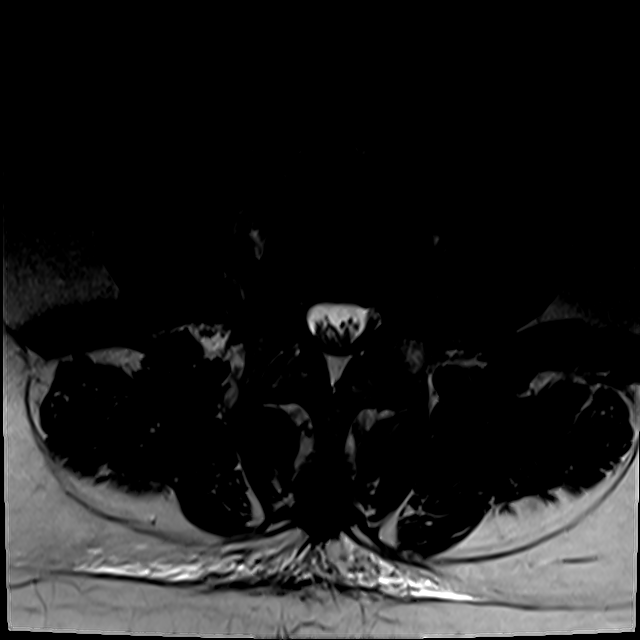
[im 33/39]
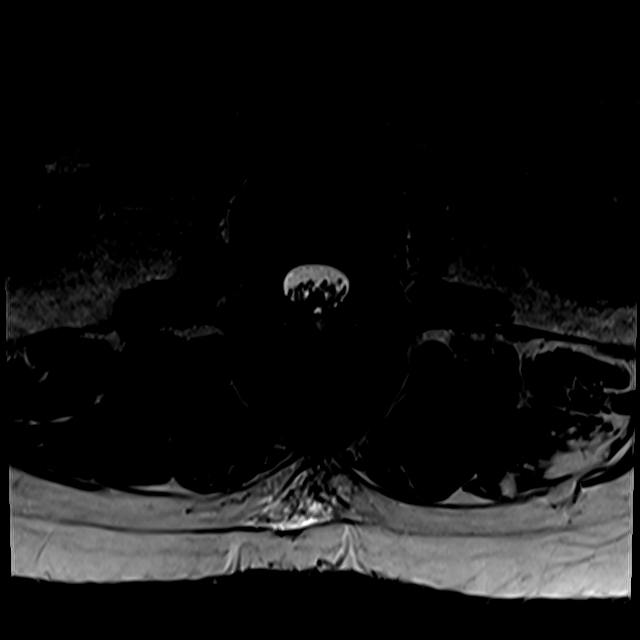

[18 of 48 positions shown; findings below may reference images not displayed]

FINDINGS: Mild motion degraded examination.

SEGMENTATION: For the purposes of this report, the last well-formed
intervertebral disc is reported as L5-S1.

ALIGNMENT: Maintained lumbar lordosis. Worsening grade 1 L3-4
anterolisthesis. Bilateral chronic L3 pars interarticularis defects.

VERTEBRAE:Vertebral bodies are intact. Severe L3-4 disc height loss
with mild acute discogenic endplate changes and disc edema.
Remaining disc morphology and signal are normal. Small

CONUS MEDULLARIS AND CAUDA EQUINA: Conus medullaris terminates at L
2 and demonstrates normal morphology and signal characteristics.
Central displacement of the cauda equina due to canal stenosis.

PARASPINAL AND OTHER SOFT TISSUES: Mildly widened edematous L3-4
interspinous space consistent with low-grade strain.

DISC LEVELS:

L1-2: No disc bulge, canal stenosis nor neural foraminal narrowing.

L2-3: No disc bulge, canal stenosis nor neural foraminal narrowing.
Moderate facet arthropathy.

L3-4: Anterolisthesis. Unroofing of 4 mm broad-based disc bulge with
superimposed 6 x 10 mm central disc extrusion with 8 mm contiguous
superior migration. Moderate canal stenosis. Moderate to severe
bilateral neural foraminal narrowing.

L4-5 and L5-S1: No disc bulge, canal stenosis nor neural foraminal
narrowing. Mild facet arthropathy.
IMPRESSION: 1. Mild motion degraded examination.
2. Increasing grade 1 L3-4 anterolisthesis, chronic bilateral L3
pars interarticularis defects.
3. 6 x 10 x 8 mm L3-4 disc extrusion could affect the traversing L4
nerves. Moderate canal stenosis L3-4.
4. Moderate to severe L3-4 neural foraminal narrowing.

## 2018-04-25 ENCOUNTER — Telehealth: Payer: Self-pay

## 2018-04-25 MED ORDER — GABAPENTIN 300 MG PO CAPS: 300.0000 mg | ORAL_CAPSULE | Freq: Three times a day (TID) | ORAL | 3 refills | Status: DC

## 2018-04-25 NOTE — Telephone Encounter (Signed)
Copied from [place] [id number]. Topic: General - Other >> Apr 25, 2018 12:25 PM Oneta Rack wrote:  Relation to pt: self Call back number: [phone] Pharmacy: [facility] [place], [place] [facility] [address] [place] [phone] (Phone) [phone] (Fax)  Reason for call:  Patient would like to discuss imaging results stating the medication prescribed for pain is not working. Patient has a scheduled appointment the surgeon [name]). Patient states what does PCP advised regarding his leg pain, please advise

## 2018-04-25 NOTE — Telephone Encounter (Signed)
Ok to increase the gabapentin to 300 tid - done erx

## 2018-04-25 NOTE — Addendum Note (Signed)
Addended by: Biagio Borg on: 04/25/2018 04:57 PM   Modules accepted: Orders

## 2018-04-26 NOTE — Telephone Encounter (Signed)
Pt has been informed and expressed understanding.  

## 2018-04-27 ENCOUNTER — Other Ambulatory Visit: Payer: Self-pay | Admitting: Internal Medicine

## 2018-04-29 ENCOUNTER — Other Ambulatory Visit: Payer: Medicare HMO

## 2018-04-30 NOTE — Telephone Encounter (Signed)
Done erx 

## 2018-05-01 NOTE — Telephone Encounter (Signed)
Patient called in and stated the med isnt working he is taking as prescribed but no improvement.

## 2018-05-01 NOTE — Telephone Encounter (Signed)
Very sorry, but I do not normally prescribe the higher pain medications such as vicodin or percocet as part of my practice; the best thing is to see ortho asap

## 2018-05-02 NOTE — Telephone Encounter (Signed)
Pt stated that he went to ortho yesterday but did not have his copay so the appt was reschedule for the 23rd.   Cecille Rubin- He would like to know if he could e seen somewhere else sooner?

## 2018-05-02 NOTE — Telephone Encounter (Signed)
Spoke to pt and he actually needs an appt around Jan 3 and he will call Dr. Rolena Infante office back to reschedule

## 2018-05-07 ENCOUNTER — Other Ambulatory Visit: Payer: Self-pay | Admitting: Internal Medicine

## 2018-05-14 ENCOUNTER — Telehealth: Payer: Self-pay | Admitting: Internal Medicine

## 2018-05-14 DIAGNOSIS — M4310 Spondylolisthesis, site unspecified: Secondary | ICD-10-CM | POA: Diagnosis not present

## 2018-05-14 DIAGNOSIS — M5136 Other intervertebral disc degeneration, lumbar region: Secondary | ICD-10-CM | POA: Diagnosis not present

## 2018-05-14 DIAGNOSIS — M5116 Intervertebral disc disorders with radiculopathy, lumbar region: Secondary | ICD-10-CM | POA: Diagnosis not present

## 2018-05-14 DIAGNOSIS — M545 Low back pain: Secondary | ICD-10-CM | POA: Diagnosis not present

## 2018-05-14 NOTE — Telephone Encounter (Signed)
Form signed with the recommendation For surgury BUT will need cardiology clearance due to complex medical regimen and hx of AVR (aortic valve replacement) with chronic anticoagulation  Please let pt know, as I think he has an appt with Dr Stanford Breed planned for 6 months from aug 2019, but should be seen further for preop clearance

## 2018-05-14 NOTE — Telephone Encounter (Signed)
Patient has dropped off a surgery clearance form to be completed for L3-4 decompression w/general.   LOV: 04-05-18 for hospital fu for low back pain.   Form has been placed in your box approve or advise if patient needs an appointment. Thank you.

## 2018-05-15 ENCOUNTER — Ambulatory Visit (INDEPENDENT_AMBULATORY_CARE_PROVIDER_SITE_OTHER): Payer: Medicare HMO | Admitting: *Deleted

## 2018-05-15 ENCOUNTER — Telehealth: Payer: Self-pay | Admitting: Internal Medicine

## 2018-05-15 DIAGNOSIS — Z5181 Encounter for therapeutic drug level monitoring: Secondary | ICD-10-CM | POA: Diagnosis not present

## 2018-05-15 DIAGNOSIS — I480 Paroxysmal atrial fibrillation: Secondary | ICD-10-CM | POA: Diagnosis not present

## 2018-05-15 LAB — POCT INR: INR: 2 (ref 2.0–3.0)

## 2018-05-15 MED ORDER — METOPROLOL SUCCINATE ER 200 MG PO TB24: 200.0000 mg | ORAL_TABLET | Freq: Every day | ORAL | 1 refills | Status: DC

## 2018-05-15 NOTE — Telephone Encounter (Signed)
Called patient but the VM box is full.   Please inform patient that the form has been signed with the information below on it. I have faxed this form to the surgeon and the cardiology office. The original is here to be picked up he would like it.

## 2018-05-15 NOTE — Telephone Encounter (Signed)
Copied from [place] [id number]. Topic: Quick Communication - Rx Refill/Question >> May 15, 2018 11:11 AM Alfredia Ferguson R wrote: Medication: metoprolol succinate (TOPROL-XL) 200 MG 24 hr tablet Has the patient contacted their pharmacy? Yes, RX wasn't sent in patient is completely out of meds  Preferred Pharmacy (with phone number or street name): [facility] [phone] - [place], [address] AT [facility] [address] [place] [phone] (Phone) [phone] (Fax)    Agent: Please be advised that RX refills may take up to 3 business days. We ask that you follow-up with your pharmacy.

## 2018-05-15 NOTE — Patient Instructions (Signed)
Description   Today take 2 tablets then continue taking 1 tablet daily except 1.5 tablets on Tuesday, Thursday, and Saturday.  Next INR in 2 weeks.  Call us with any new medications or concerns [phone] [facility], [facility] [phone].

## 2018-05-17 ENCOUNTER — Telehealth: Payer: Self-pay | Admitting: *Deleted

## 2018-05-17 NOTE — Telephone Encounter (Signed)
   [facility] Pre-operative Risk Assessment    Request for surgical clearance:  1. What type of surgery is being performed? RIGHT L3-L4 DISECTOMY   2. When is this surgery scheduled? 06/06/2018   3. What type of clearance is required (medical clearance vs. Pharmacy clearance to hold med vs. Both)?  BOTH  4. Are there any medications that need to be held prior to surgery and how long? COUMADIN 5-7 DAYS   5. Practice name and name of physician performing surgery?  [facility]   6. What is your office phone number [phone]    7.   What is your office fax number [phone]  8.   Anesthesia type (None, local, MAC, general) ?  GENERAL   Jeanann Lewandowsky 05/17/2018, 2:43 PM  _________________________________________________________________   (provider comments below)

## 2018-05-18 NOTE — Telephone Encounter (Signed)
Patient has coumadin appointment 1/8. lovenox bridge can be coordinated at that time

## 2018-05-18 NOTE — Telephone Encounter (Signed)
   Primary Cardiologist: Kirk Ruths, MD  Chart reviewed as part of pre-operative protocol coverage. Patient was contacted 05/18/2018 in reference to pre-operative risk assessment for pending surgery as outlined below.  Mr. Silbernagel has a history of CABG, PAF, aortic valve replacement. Delroy Ordway was last seen on 01/15/18 by Kerin Ransom, PA.  Since that day, Korry Dalgleish has done well with no perceived recurrences of Afib and no fluid retention issues.  He had been exercising on most days with 30-60 minutes of cardio with no exertional symptoms until about 3 months ago when his back pain began to limit his activity.  Therefore, based on ACC/AHA guidelines, the patient would be at acceptable risk for the planned procedure without further cardiovascular testing.   According to our pharmacy protocol:        Patient with diagnosis of mechanical AVR and afib on warfarin for anticoagulation.    Procedure: RIGHT L3-L4 DISECTOMY Date of procedure: 06/06/18  CHADS2-VASc score of  6 (CHF, HTN, AGE, DM2, stroke/tia x 2, CAD, AGE, male)  CrCl 127 ml/min Platelet count 147  Per office protocol, patient can hold warfarin for 5 days prior to procedure.   Patient will need bridging with Lovenox (enoxaparin) around procedure.  Patient has coumadin appointment 05/30/18. lovenox bridge can be coordinated at that time       I will route this recommendation to the requesting party via [facility] fax function and remove from pre-op pool.  Please call with questions.  Daune Perch, NP 05/18/2018, 3:53 PM

## 2018-05-18 NOTE — Telephone Encounter (Signed)
Patient with diagnosis of mechanical AVR and afib on warfarin for anticoagulation.    Procedure: RIGHT L3-L4 DISECTOMY  Date of procedure: 06/06/18  CHADS2-VASc score of  6 (CHF, HTN, AGE, DM2, stroke/tia x 2, CAD, AGE, male)  CrCl 127 ml/min Platelet count 147  Per office protocol, patient can hold warfarin for 5 days prior to procedure.   Patient will need bridging with Lovenox (enoxaparin) around procedure.

## 2018-05-21 ENCOUNTER — Ambulatory Visit (HOSPITAL_COMMUNITY): Payer: Self-pay | Admitting: Orthopedic Surgery

## 2018-05-29 ENCOUNTER — Ambulatory Visit (INDEPENDENT_AMBULATORY_CARE_PROVIDER_SITE_OTHER): Payer: Medicare HMO

## 2018-05-29 DIAGNOSIS — Z5181 Encounter for therapeutic drug level monitoring: Secondary | ICD-10-CM

## 2018-05-29 DIAGNOSIS — I480 Paroxysmal atrial fibrillation: Secondary | ICD-10-CM

## 2018-05-29 LAB — POCT INR: INR: 2.1 (ref 2.0–3.0)

## 2018-05-29 MED ORDER — ENOXAPARIN SODIUM 120 MG/0.8ML ~~LOC~~ SOLN: 120.0000 mg | Freq: Two times a day (BID) | SUBCUTANEOUS | 1 refills | Status: DC

## 2018-05-29 NOTE — Patient Instructions (Signed)
Thursday, 1/9: Last dose of Coumadin.  Friday, 1/10: No Coumadin or Lovenox.  Saturday, 1/11: Inject Lovenox 120 mg in the fatty abdominal tissue at least 2 inches from the belly button twice a day about 12 hours apart, 8am and 8pm rotate sites. No Coumadin.  Sunday, 1/12: Inject Lovenox in the fatty tissue every 12 hours, 8am and 8pm. No Coumadin.  Monday, 1/13: Inject Lovenox in the fatty tissue every 12 hours, 8am and 8pm. No Coumadin.  Tuesday, 1/14: Inject Lovenox in the fatty tissue in the morning at 8 am (No PM dose). No Coumadin.  Wednesday, 1/15: Procedure Day - No Lovenox - Resume Coumadin in the evening or as directed by doctor (take an extra half tablet with usual dose for 2 days then resume normal dose).  Thursday, 1/16: Resume Lovenox inject in the fatty tissue every 12 hours and take Coumadin.  Friday, 1/17: Inject Lovenox in the fatty tissue every 12 hours and take Coumadin.  Saturday, 1/18: Inject Lovenox in the fatty tissue every 12 hours and take Coumadin.  Sunday, 1/19: Inject Lovenox in the fatty tissue every 12 hours and take Coumadin.  Monday, 1/20: Inject Lovenox in the fatty tissue every 12 hours and take Coumadin.  Tuesday, 1/21: Coumadin appt to check INR.

## 2018-05-30 ENCOUNTER — Other Ambulatory Visit: Payer: Self-pay | Admitting: Cardiology

## 2018-05-30 DIAGNOSIS — M5116 Intervertebral disc disorders with radiculopathy, lumbar region: Secondary | ICD-10-CM | POA: Diagnosis not present

## 2018-05-30 NOTE — Pre-Procedure Instructions (Signed)
[name]  05/30/2018      [id number] - [place], [place] - [phone] AT [address] [place] [address] Phone: [phone] Fax: [phone]  [place], [place] [place] [address] Phone: [phone] Fax: [phone]  [facility] - [place], [address] [place] [address] Phone: [phone] Fax: [phone]    Your procedure is scheduled on Jan. 15  Report to [facility] Admitting at 1:30 P.M.  Call this number if you have problems the morning of surgery:  [phone]   Remember:  Do not eat or drink after midnight.      Take these medicines the morning of surgery with A SIP OF WATER :              Amlodipine (norvasc)             Gabapentin (neurontin)             Levothyroxine (synthroid)             Metoprolol (toprol-xl)             Nitroglycerine if needed             7 days prior to surgery STOP taking aspirin,Aleve, Naproxen, Ibuprofen, Motrin, Advil, Goody's, BC's, all herbal medications, fish oil, and all vitamins.                   Follow your surgeon's instructions on when to stop COUMADIN AND PLAVIX.  If no instructions were given by your surgeon then you will need to call the office to get those instructions.                    How to Manage Your Diabetes Before and After Surgery  Why is it important to control my blood sugar before and after surgery? . Improving blood sugar levels before and after surgery helps healing and can limit problems. . A way of improving blood sugar control is eating a healthy diet by: o  Eating less sugar and carbohydrates o  Increasing activity/exercise o  Talking with your doctor about reaching your blood sugar goals . High blood sugars (greater than 180 mg/dL) can raise your risk of infections  and slow your recovery, so you will need to focus on controlling your diabetes during the weeks before surgery. . Make sure that the doctor who takes care of your diabetes knows about your planned surgery including the date and location.  How do I manage my blood sugar before surgery? . Check your blood sugar at least 4 times a day, starting 2 days before surgery, to make sure that the level is not too high or low. o Check your blood sugar the morning of your surgery when you wake up and every 2 hours until you get to the Short Stay unit. . If your blood sugar is less than 70 mg/dL, you will need to treat for low blood sugar: o Do not take insulin. o Treat a low blood sugar (less than 70 mg/dL) with  cup of clear juice (cranberry or apple), 4 glucose tablets, OR glucose gel. Recheck blood sugar in 15 minutes after treatment (to make sure it is greater than 70 mg/dL). If your blood sugar is not greater than 70 mg/dL on recheck,  call [phone] o  for further instructions. . Report your blood sugar to the short stay nurse when you get to Short Stay.  . If you are admitted to the hospital after surgery: o Your blood sugar will be checked by the staff and you will probably be given insulin after surgery (instead of oral diabetes medicines) to make sure you have good blood sugar levels. o The goal for blood sugar control after surgery is 80-180 mg/dL.       WHAT DO I DO ABOUT MY DIABETES MEDICATION?   [name] Do not take oral diabetes medicines (pills) the morning of surgery.  ( glimepiride/metformin)      Do not wear jewelry.  Do not wear lotions, powders, or perfumes, or deodorant.  Do not shave 48 hours prior to surgery.  Men may shave face and neck.  Do not bring valuables to the hospital.  [facility] is not responsible for any belongings or valuables.  Contacts, dentures or bridgework may not be worn into surgery.  Leave your suitcase in the car.  After surgery it may be brought to  your room.  For patients admitted to the hospital, discharge time will be determined by your treatment team.  Patients discharged the day of surgery will not be allowed to drive home.    Special instructions:  [facility]- Preparing For Surgery  Before surgery, you can play an important role. Because skin is not sterile, your skin needs to be as free of germs as possible. You can reduce the number of germs on your skin by washing with CHG (chlorahexidine gluconate) Soap before surgery.  CHG is an antiseptic cleaner which kills germs and bonds with the skin to continue killing germs even after washing.    Oral Hygiene is also important to reduce your risk of infection.  Remember - BRUSH YOUR TEETH THE MORNING OF SURGERY WITH YOUR REGULAR TOOTHPASTE  Please do not use if you have an allergy to CHG or antibacterial soaps. If your skin becomes reddened/irritated stop using the CHG.  Do not shave (including legs and underarms) for at least 48 hours prior to first CHG shower. It is OK to shave your face.  Please follow these instructions carefully.   1. Shower the NIGHT BEFORE SURGERY and the MORNING OF SURGERY with CHG.   2. If you chose to wash your hair, wash your hair first as usual with your normal shampoo.  3. After you shampoo, rinse your hair and body thoroughly to remove the shampoo.  4. Use CHG as you would any other liquid soap. You can apply CHG directly to the skin and wash gently with a scrungie or a clean washcloth.   5. Apply the CHG Soap to your body ONLY FROM THE NECK DOWN.  Do not use on open wounds or open sores. Avoid contact with your eyes, ears, mouth and genitals (private parts). Wash Face and genitals (private parts)  with your normal soap.  6. Wash thoroughly, paying special attention to the area where your surgery will be performed.  7. Thoroughly rinse your body with warm water from the neck down.  8. DO NOT shower/wash with your normal soap after using and  rinsing off the CHG Soap.  9. Pat yourself dry with a CLEAN TOWEL.  10. Wear CLEAN PAJAMAS to bed the night before surgery, wear comfortable clothes the morning of surgery  11. Place CLEAN SHEETS on your bed the night of your first shower and DO NOT SLEEP  WITH PETS.    Day of Surgery:  Do not apply any deodorants/lotions.  Please wear clean clothes to the hospital/surgery center.   Remember to brush your teeth WITH YOUR REGULAR TOOTHPASTE.    Please read over the following fact sheets that you were given. Coughing and Deep Breathing, MRSA Information and Surgical Site Infection Prevention

## 2018-05-31 ENCOUNTER — Other Ambulatory Visit: Payer: Self-pay

## 2018-05-31 ENCOUNTER — Encounter (HOSPITAL_COMMUNITY)
Admission: RE | Admit: 2018-05-31 | Discharge: 2018-05-31 | Disposition: A | Discharge status: Home / Self Care | Payer: Medicare HMO | Source: Ambulatory Visit | Attending: Orthopedic Surgery | Admitting: Orthopedic Surgery

## 2018-05-31 ENCOUNTER — Encounter (HOSPITAL_COMMUNITY): Payer: Self-pay

## 2018-05-31 ENCOUNTER — Other Ambulatory Visit: Payer: Self-pay | Admitting: Internal Medicine

## 2018-05-31 ENCOUNTER — Telehealth: Payer: Self-pay

## 2018-05-31 DIAGNOSIS — Z01812 Encounter for preprocedural laboratory examination: Secondary | ICD-10-CM | POA: Insufficient documentation

## 2018-05-31 HISTORY — DX: Cerebral infarction, unspecified: I63.9

## 2018-05-31 LAB — CBC
HCT: 40.1 % (ref 39.0–52.0)
Hemoglobin: 13.8 g/dL (ref 13.0–17.0)
MCH: 28 pg (ref 26.0–34.0)
MCHC: 34.4 g/dL (ref 30.0–36.0)
MCV: 81.5 fL (ref 80.0–100.0)
Platelets: 184 10*3/uL (ref 150–400)
RBC: 4.92 MIL/uL (ref 4.22–5.81)
RDW: 15.5 % (ref 11.5–15.5)
WBC: 5.6 10*3/uL (ref 4.0–10.5)
nRBC: 0 % (ref 0.0–0.2)

## 2018-05-31 LAB — BASIC METABOLIC PANEL
Anion gap: 9 (ref 5–15)
BUN: 9 mg/dL (ref 6–20)
CO2: 26 mmol/L (ref 22–32)
Calcium: 8.8 mg/dL — ABNORMAL LOW (ref 8.9–10.3)
Chloride: 103 mmol/L (ref 98–111)
Creatinine, Ser: 0.95 mg/dL (ref 0.61–1.24)
GFR calc Af Amer: >60 mL/min (ref >60)
GFR calc non Af Amer: >60 mL/min (ref >60)
Glucose, Bld: 139 mg/dL — ABNORMAL HIGH (ref 70–99)
Potassium: 3.3 mmol/L — ABNORMAL LOW (ref 3.5–5.1)
Sodium: 138 mmol/L (ref 135–145)

## 2018-05-31 LAB — SURGICAL PCR SCREEN
MRSA, PCR: NEGATIVE
Staphylococcus aureus: POSITIVE — AB

## 2018-05-31 LAB — GLUCOSE, CAPILLARY: Glucose-Capillary: 160 mg/dL — ABNORMAL HIGH (ref 70–99)

## 2018-05-31 NOTE — Progress Notes (Signed)
I called a prescription for Mupirocin ointment to [facility], [address] [place].

## 2018-05-31 NOTE — Telephone Encounter (Signed)
Before reaching out to the patient I asked Stefannie, CMA at [facility], are we responsible for a PA for a medication that was not prescribed by a physician here. She informed me that we are not responsible for that PA. I called the patient and informed him to follow up with the prescribing physician to have that PA initiated. He expressed understanding.     Copied from [place] [phone]. Topic: General - Other >> May 30, 2018  4:08 PM Leward Quan A wrote: Reason for CRM: Patient called to inquire about the prior authorization for enoxaparin (LOVENOX) 120 MG/0.8ML injection. He stated that he need to start taking this medication by this Saturday 06/02/2018. He is waiting for a call back regarding this medication Prior auth. Please advise Ph# [phone]

## 2018-05-31 NOTE — Progress Notes (Signed)
PCP: Cathlean Cower  Cardiologist: Stanford Breed  DM: Type 2, does not check sugars  SA: denies  Pt instructed by Cardiologist to stop Coumadin 05-31-18, and start Lovenox on Saturday 06-02-18.   Called Dr. Charmayne Sheer office for further instructions on when to stop Plavix.  Collie Siad from office stated that he is not the one who put him on the medication therefore it is not his decision on when to stop taking medication for surgery, per his PA.  Pt instructed to call PCP for further instructions regarding Plavix.  Pt denies SOB, cough, fever, chest pain  Pt stated understanding of instructions given for DOS.

## 2018-06-01 ENCOUNTER — Telehealth: Payer: Self-pay | Admitting: Cardiology

## 2018-06-01 NOTE — Telephone Encounter (Signed)
Spoke with Orson Slick, surgery scheduler with [facility]. She has been made aware that it is recommended that if pt has to hold Plavix for his upcoming surgery, pt is to take Aspirin 81 mg daily Once surgery complete and hemostasis achieved could then discontinue aspirin, resume Plavix and proceed with Lovenox Coumadin. I have re-faxed over this order to [facility].  I also have called pt to make him aware of the recommendations re: aspirin / plavix, and that the surgeons office will instruct him on when to restart the Plavix and d/c the Aspirin.  Pt thanked me for the call.

## 2018-06-01 NOTE — Anesthesia Preprocedure Evaluation (Addendum)
Anesthesia Evaluation  Patient identified by MRN, date of birth, ID band Patient awake    Reviewed: Allergy & Precautions, NPO status , Patient's Chart, lab work & pertinent test results, reviewed documented beta blocker date and time   History of Anesthesia Complications Negative for: history of anesthetic complications  Airway Mallampati: II  TM Distance: >3 FB Neck ROM: Full    Dental  (+) Dental Advisory Given, Missing,    Pulmonary neg pulmonary ROS,    Pulmonary exam normal breath sounds clear to auscultation       Cardiovascular hypertension, Pt. on medications and Pt. on home beta blockers + CAD, + Cardiac Stents, + CABG and +CHF  Normal cardiovascular exam+ dysrhythmias Atrial Fibrillation  Rhythm:Regular Rate:Normal  TTE 10/2017: EF 55-60%, mechanical AV, mild MR, trivial PVR, PASP 40      Neuro/Psych PSYCHIATRIC DISORDERS Anxiety Depression CVA    GI/Hepatic negative GI ROS, Neg liver ROS,   Endo/Other  diabetes, Type 2Hypothyroidism   Renal/GU negative Renal ROS     Musculoskeletal negative musculoskeletal ROS (+)   Abdominal   Peds  Hematology  (+) anemia ,   Anesthesia Other Findings Day of surgery medications reviewed with the patient.  Reproductive/Obstetrics                         Anesthesia Physical Anesthesia Plan  ASA: III  Anesthesia Plan: General   Post-op Pain Management:    Induction: Intravenous  PONV Risk Score and Plan: Treatment may vary due to age or medical condition, Ondansetron, Dexamethasone and Midazolam  Airway Management Planned: Oral ETT  Additional Equipment:   Intra-op Plan:   Post-operative Plan: Extubation in OR  Informed Consent: I have reviewed the patients History and Physical, chart, labs and discussed the procedure including the risks, benefits and alternatives for the proposed anesthesia with the patient or authorized representative  who has indicated his/her understanding and acceptance.     Dental advisory given  Plan Discussed with: CRNA  Anesthesia Plan Comments: (See PAT note by Karoline Caldwell, PA-C )      Anesthesia Quick Evaluation

## 2018-06-01 NOTE — Telephone Encounter (Signed)
Patient had PCI in June 2019.  If Plavix needs to be held would treat with aspirin 81 mg daily while off of Plavix.  Once surgery complete and hemostasis achieved could then discontinue aspirin, resume Plavix and proceed with Lovenox Coumadin. [name]

## 2018-06-01 NOTE — Telephone Encounter (Signed)
Per chart review, clearance was addressed on 05/18/2018 (in 05/17/18 phone note).   Per phone note, request to hold Plavix was not noted (only warfarin)  Spoke with patient, who has surgery Jun 06, 2018. He is needing to know if he should hold Plavix (if so, how long)  Called [facility], Orson Slick, to follow up on this - what is the request of their office regarding how long this should be held. Left a message for return call  Will route as high priority to pre-op pool

## 2018-06-01 NOTE — Telephone Encounter (Signed)
New message   Patient needs to know if he needs to stop plavix before his surgery 06/06/2018. He states that this was not on his preop clearance information.

## 2018-06-01 NOTE — Progress Notes (Signed)
Anesthesia Chart Review:  Case:  [id number] Date/Time:  06/06/18 1515   Procedure:  Right L3-4 disectomy (Right ) - 2.5 hrs   Anesthesia type:  General   Pre-op diagnosis:  Right L3-4 HNP   Location:  [facility] ROOM 04 / [facility] OR   Surgeon:  Melina Schools, MD      DISCUSSION: 53 yo male never smoker. Pertinent hx includes Bentall procedure in 2004 with CABG, s/p SVG-RCA PCI with BMS in Feb 2015 and re do PCI June 2019 with a BMS for ISR. He also has a history of PAF, TIA '04, NIDDM, and HTN.  Cardiac clearance 05/18/18 by Johna Roles, NP. Pt is on chronic coumadin due to AVR and is on Lovenox bridge. Pt is also on Plavix s/p PCI June 2019. Per Dr. Stanford Breed " If Plavix needs to be held would treat with aspirin 81 mg daily while off of Plavix.  Once surgery complete and hemostasis achieved could then discontinue aspirin, resume Plavix and proceed with Lovenox Coumadin."  Medical clearance by PCP Dr. Cathlean Cower on pt chart.  Anticipate he can proceed as planned barring acute status change.  VS: BP (!) 136/93   Pulse 99   Temp 36.8 C   Resp 20   Ht 6\' 2"  (1.88 m)   Wt 135.9 kg   SpO2 100%   BMI 38.47 kg/m   PROVIDERS: Biagio Borg, MD is PCP  Kirk Ruths, MD is Cardiologist  LABS: Labs reviewed: Acceptable for surgery. (all labs ordered are listed, but only abnormal results are displayed)  Labs Reviewed  SURGICAL PCR SCREEN - Abnormal; Notable for the following components:      Result Value   Staphylococcus aureus POSITIVE (*)    All other components within normal limits  GLUCOSE, CAPILLARY - Abnormal; Notable for the following components:   Glucose-Capillary 160 (*)    All other components within normal limits  BASIC METABOLIC PANEL - Abnormal; Notable for the following components:   Potassium 3.3 (*)    Glucose, Bld 139 (*)    Calcium 8.8 (*)    All other components within normal limits  CBC     IMAGES: CHEST - 2 VIEW 10/21/2017  COMPARISON:   08/26/2017  FINDINGS: Cardiac shadow is mildly enlarged but stable postsurgical changes are again seen. The lungs are well aerated bilaterally. Mild vascular congestion is again seen without focal interstitial edema or sizable effusion. No focal infiltrate is noted. No bony abnormality is seen.  IMPRESSION: Mild vascular congestion stable in appearance from the prior exam. No acute abnormality is noted.  EKG: 01/15/18: Sinus rhythm. Rate 73.   CV: TTE 10/24/2017: - Left ventricle: The cavity size was mildly dilated. There was   moderate concentric hypertrophy. Systolic function was normal.   The estimated ejection fraction was in the range of 55% to 60%.   There is akinesis of the basalinferolateral and inferior   myocardium. The study is not technically sufficient to allow   evaluation of LV diastolic function. - Aortic valve: A mechanical prosthesis was present. Mean gradient   (S): 21 mm Hg. Valve area (VTI): 1.84 cm^2. Valve area (Vmax):   1.83 cm^2. Valve area (Vmean): 1.92 cm^2. - Mitral valve: There was mild regurgitation. - Left atrium: The atrium was mildly dilated. - Pulmonic valve: There was trivial regurgitation. - Pulmonary arteries: PA peak pressure: 40 mm Hg (S).  Impressions:  - Compared to prior echo, the mean AV gradient has increased from  19 to 72mmHg. The right ventricular systolic pressure was   increased consistent with mild pulmonary hypertension.  Cath and PCI 10/23/2017:  Ost RCA lesion is 100% stenosed.  Origin lesion is 99% stenosed, SVG to RCA graft. A drug-eluting stent was successfully placed using a STENT RESOLUTE ONYX 5.0X15, postdilated to 5.5 mm.  Post intervention, there is a 10% residual stenosis.  Prox Cx to Mid Cx lesion is 25% stenosed.  Mid LAD lesion is 25% stenosed.  Hemodynamic findings consistent with mild pulmonary hypertension.  Difficult to engage the SVG to RCA with a guide catheter. Had to place guide near the  prior stent and was fortunate to get wire into the vessel, which allowed the guide to be pulled in.  A drug-eluting stent was successfully placed using a STENT RESOLUTE ONYX 5.0X15.   Restart heparin 6 hours post sheath pull.   Recommend to resume Warfarin, at currently prescribed dose and frequency, today, 10/23/2017.  Recommend antiplatelet therapy of Clopidogrel 75mg  QD for 6-12 months.  Aspirin for 30 days.   Brilinta given during the procedure but would change to Plavix once he is therapeutic on his Coumadin.  Would give a 300 mg loading dose, followed by 75 mg daily.  Past Medical History:  Diagnosis Date  . Ascending aortic dissection ([place])    a. 2000 - with aortic valve involvement. S/p repair of aortic dissection with placement of mechanical AVR.  [name] CAD (coronary artery disease)    a. 2000 VG->PDA @ time of Ao dissection repair;  b. Cardiac CT 06/2010: no CAD, only mild plaque in prox RCA;  c. 2015 Cath: LM nl, LAD nl, LCX nl, RCA 100, VG->PDA 90 (4.5x12 Rebel BMS). d. 10/23/17 instent restenosis SVG to RCA, DES placed.  . Cholelithiasis 08/22/2013  . Chronic diastolic CHF (congestive heart failure) ([place])    a. 03/2016 Echo: EF 55-60%, no rwma, Gr2 DD.  . Diabetes mellitus   . Dyspnea   . Gross hematuria   . H/O mechanical aortic valve replacement    a. 03/2016 Echo: AoV mean gradient 23mmHg, Valve Area (VTI) 1.36 cm^2, (Vmax) 1.22 cm^2, mod dil LA.  [name] Hyperlipidemia   . Hypertension   . NEPHROLITHIASIS, HX OF   . Obesity   . PAF (paroxysmal atrial fibrillation) ([place])    a. H/o such, with recurrence of coarse afib/flutter during ER consult 03/2012.  . Stroke ([place])   . TRANSIENT ISCHEMIC ATTACK, HX OF    a. In 2004.  [name] Unspecified vitamin D deficiency     Past Surgical History:  Procedure Laterality Date  . AORTIC VALVE REPLACEMENT  2000  . APPENDECTOMY  1998  . CORONARY ARTERY BYPASS GRAFT  2000  . CORONARY STENT INTERVENTION N/A 10/23/2017   Procedure: CORONARY STENT  INTERVENTION;  Surgeon: [name], MD;  Location: [place] [facility];  Service: Cardiovascular;  Laterality: N/A;  . LEFT AND RIGHT HEART CATHETERIZATION WITH CORONARY ANGIOGRAM N/A 07/19/2013   Procedure: LEFT AND RIGHT HEART CATHETERIZATION WITH CORONARY ANGIOGRAM;  Surgeon: [name], MD;  Location: [facility] C[facility] LAB;  Service: Cardiovascular;  Laterality: N/A;  . RIGHT HEART CATH AND CORONARY/GRAFT ANGIOGRAPHY N/A 10/23/2017   Procedure: RIGHT HEART CATH AND CORONARY/GRAFT ANGIOGRAPHY;  Surgeon: [name], MD;  Location: [place] [facility];  Service: Cardiovascular;  Laterality: N/A;  . TEE WITHOUT CARDIOVERSION N/A 07/22/2013   Procedure: TRANSESOPHAGEAL ECHOCARDIOGRAM (TEE);  Surgeon: [name], MD;  Location: [place];  Service: Cardiovascular;  Laterality:  N/A;    MEDICATIONS: . acetaminophen (TYLENOL) 325 MG tablet  . amLODipine (NORVASC) 10 MG tablet  . atorvastatin (LIPITOR) 40 MG tablet  . cholecalciferol (VITAMIN D) 1000 UNITS tablet  . clopidogrel (PLAVIX) 75 MG tablet  . cyclobenzaprine (FLEXERIL) 5 MG tablet  . enoxaparin (LOVENOX) 120 MG/0.8ML injection  . furosemide (LASIX) 40 MG tablet  . gabapentin (NEURONTIN) 300 MG capsule  . glimepiride (AMARYL) 1 MG tablet  . hydrocortisone cream 1 %  . levothyroxine (SYNTHROID, LEVOTHROID) 50 MCG tablet  . losartan (COZAAR) 100 MG tablet  . metFORMIN (GLUCOPHAGE) 1000 MG tablet  . methylPREDNISolone (MEDROL DOSEPAK) 4 MG TBPK tablet  . metoprolol (TOPROL-XL) 200 MG 24 hr tablet  . nitroGLYCERIN (NITROSTAT) 0.4 MG SL tablet  . Potassium Chloride ER 20 MEQ TBCR  . traMADol (ULTRAM) 50 MG tablet  . Vitamin D, Ergocalciferol, (DRISDOL) 50000 units CAPS capsule  . warfarin (COUMADIN) 5 MG tablet   No current facility-administered medications for this encounter.     Wynonia Musty [facility] Short Stay Center/Anesthesiology Phone [phone] 06/01/2018 1:11 PM

## 2018-06-01 NOTE — Telephone Encounter (Signed)
   Primary Cardiologist: Kirk Ruths, MD  Chart reviewed as part of pre-operative protocol coverage, brought to my attention as high priority due to question about holding Plavix.  Clearance was already addressed for the procedure by Pecolia Ades on 05/18/18. At that time the clearance only requested holding Coumadin. Lovenox bridge was arranged.  We are asked to weigh in on holding Plavix as well. He underwent PCI in 10/2017 and cath note at that time stated, "Recommend antiplatelet therapy of Clopidogrel 75mg  QD for 6-12 months.  Aspirin for 30 days. "  I will route to Dr. Stanford Breed ASAP for input on holding Plavix. Dr. Stanford Breed, - Please route response to P CV DIV PREOP (the pre-op pool). Thank you.  [name], PA-C 06/01/2018, 10:27 AM

## 2018-06-01 NOTE — Telephone Encounter (Signed)
   Primary Cardiologist: Kirk Ruths, MD  See notes below. Clearance was already addressed for the procedure by Pecolia Ades on 05/18/18. At that time the clearance only requested holding Coumadin. Lovenox bridge was arranged. The original clearance did not include request to hold Plavix, but the patient reached out today inquiring about whether to hold.  Sheral Apley reached out to [facility] to clarify need to hold Plavix so this information is not known yet. Dr. Stanford Breed reviewed this patient's chart and states, "Patient had PCI in June 2019.  If Plavix needs to be held would treat with aspirin 81 mg daily while off of Plavix.  Once surgery complete and hemostasis achieved could then discontinue aspirin, resume Plavix and proceed with Lovenox Coumadin."  I will route this message to our callback nurse to call both surgeon and patient to communicate information ASAP given impending surgery date. If for some reason the date requires changing, the surgeon's office will need to let us know so the Coumadin clinic can adjust instructions for Lovenox bridge.  [name], PA-C 06/01/2018, 10:51 AM

## 2018-06-05 MED ORDER — DEXTROSE 5 % IV SOLN
3.0000 g | INTRAVENOUS | Status: DC
  Filled 2018-06-05: qty 3000

## 2018-06-06 ENCOUNTER — Ambulatory Visit (HOSPITAL_COMMUNITY)
Admission: RE | Disposition: A | Discharge status: Home / Self Care | Payer: Self-pay | Source: Home / Self Care | Attending: Orthopedic Surgery

## 2018-06-06 ENCOUNTER — Ambulatory Visit (HOSPITAL_COMMUNITY): Payer: Medicare HMO

## 2018-06-06 ENCOUNTER — Ambulatory Visit (HOSPITAL_COMMUNITY): Payer: Medicare HMO | Admitting: Physician Assistant

## 2018-06-06 ENCOUNTER — Encounter (HOSPITAL_COMMUNITY): Payer: Self-pay | Admitting: Registered Nurse

## 2018-06-06 ENCOUNTER — Ambulatory Visit (HOSPITAL_COMMUNITY): Payer: Medicare HMO | Admitting: Registered Nurse

## 2018-06-06 ENCOUNTER — Observation Stay (HOSPITAL_COMMUNITY)
Admission: RE | Admit: 2018-06-06 | Discharge: 2018-06-08 | Disposition: A | Discharge status: Home / Self Care | Payer: Medicare HMO | Attending: Orthopedic Surgery | Admitting: Orthopedic Surgery

## 2018-06-06 DIAGNOSIS — Z8673 Personal history of transient ischemic attack (TIA), and cerebral infarction without residual deficits: Secondary | ICD-10-CM | POA: Insufficient documentation

## 2018-06-06 DIAGNOSIS — Z7984 Long term (current) use of oral hypoglycemic drugs: Secondary | ICD-10-CM | POA: Insufficient documentation

## 2018-06-06 DIAGNOSIS — E039 Hypothyroidism, unspecified: Secondary | ICD-10-CM | POA: Diagnosis not present

## 2018-06-06 DIAGNOSIS — I2510 Atherosclerotic heart disease of native coronary artery without angina pectoris: Secondary | ICD-10-CM

## 2018-06-06 DIAGNOSIS — I110 Hypertensive heart disease with heart failure: Secondary | ICD-10-CM | POA: Diagnosis not present

## 2018-06-06 DIAGNOSIS — Z419 Encounter for procedure for purposes other than remedying health state, unspecified: Secondary | ICD-10-CM

## 2018-06-06 DIAGNOSIS — I5032 Chronic diastolic (congestive) heart failure: Secondary | ICD-10-CM | POA: Diagnosis not present

## 2018-06-06 DIAGNOSIS — Z6835 Body mass index (BMI) 35.0-35.9, adult: Secondary | ICD-10-CM | POA: Insufficient documentation

## 2018-06-06 DIAGNOSIS — M5126 Other intervertebral disc displacement, lumbar region: Secondary | ICD-10-CM | POA: Diagnosis present

## 2018-06-06 DIAGNOSIS — I5033 Acute on chronic diastolic (congestive) heart failure: Secondary | ICD-10-CM | POA: Diagnosis not present

## 2018-06-06 DIAGNOSIS — Z981 Arthrodesis status: Secondary | ICD-10-CM | POA: Diagnosis not present

## 2018-06-06 DIAGNOSIS — I480 Paroxysmal atrial fibrillation: Secondary | ICD-10-CM | POA: Insufficient documentation

## 2018-06-06 DIAGNOSIS — E7800 Pure hypercholesterolemia, unspecified: Secondary | ICD-10-CM | POA: Insufficient documentation

## 2018-06-06 DIAGNOSIS — Z952 Presence of prosthetic heart valve: Secondary | ICD-10-CM | POA: Diagnosis not present

## 2018-06-06 DIAGNOSIS — E119 Type 2 diabetes mellitus without complications: Secondary | ICD-10-CM

## 2018-06-06 DIAGNOSIS — M5116 Intervertebral disc disorders with radiculopathy, lumbar region: Secondary | ICD-10-CM | POA: Diagnosis not present

## 2018-06-06 DIAGNOSIS — E1165 Type 2 diabetes mellitus with hyperglycemia: Secondary | ICD-10-CM | POA: Diagnosis not present

## 2018-06-06 DIAGNOSIS — E785 Hyperlipidemia, unspecified: Secondary | ICD-10-CM | POA: Diagnosis not present

## 2018-06-06 DIAGNOSIS — E669 Obesity, unspecified: Secondary | ICD-10-CM | POA: Diagnosis not present

## 2018-06-06 DIAGNOSIS — Z951 Presence of aortocoronary bypass graft: Secondary | ICD-10-CM | POA: Insufficient documentation

## 2018-06-06 DIAGNOSIS — R51 Headache: Secondary | ICD-10-CM | POA: Insufficient documentation

## 2018-06-06 DIAGNOSIS — E876 Hypokalemia: Secondary | ICD-10-CM | POA: Diagnosis not present

## 2018-06-06 DIAGNOSIS — Z955 Presence of coronary angioplasty implant and graft: Secondary | ICD-10-CM | POA: Insufficient documentation

## 2018-06-06 DIAGNOSIS — M4316 Spondylolisthesis, lumbar region: Secondary | ICD-10-CM | POA: Diagnosis not present

## 2018-06-06 DIAGNOSIS — I10 Essential (primary) hypertension: Secondary | ICD-10-CM

## 2018-06-06 DIAGNOSIS — Z7901 Long term (current) use of anticoagulants: Secondary | ICD-10-CM | POA: Diagnosis not present

## 2018-06-06 DIAGNOSIS — R42 Dizziness and giddiness: Secondary | ICD-10-CM | POA: Insufficient documentation

## 2018-06-06 DIAGNOSIS — Z79899 Other long term (current) drug therapy: Secondary | ICD-10-CM | POA: Diagnosis not present

## 2018-06-06 DIAGNOSIS — I5042 Chronic combined systolic (congestive) and diastolic (congestive) heart failure: Secondary | ICD-10-CM

## 2018-06-06 DIAGNOSIS — Z79891 Long term (current) use of opiate analgesic: Secondary | ICD-10-CM | POA: Diagnosis not present

## 2018-06-06 DIAGNOSIS — Z79890 Hormone replacement therapy (postmenopausal): Secondary | ICD-10-CM | POA: Insufficient documentation

## 2018-06-06 DIAGNOSIS — E559 Vitamin D deficiency, unspecified: Secondary | ICD-10-CM | POA: Insufficient documentation

## 2018-06-06 HISTORY — PX: LUMBAR LAMINECTOMY/DECOMPRESSION MICRODISCECTOMY: SHX5026

## 2018-06-06 LAB — GLUCOSE, CAPILLARY
Glucose-Capillary: 118 mg/dL — ABNORMAL HIGH (ref 70–99)
Glucose-Capillary: 104 mg/dL — ABNORMAL HIGH (ref 70–99)
Glucose-Capillary: 130 mg/dL — ABNORMAL HIGH (ref 70–99)
Glucose-Capillary: 230 mg/dL — ABNORMAL HIGH (ref 70–99)

## 2018-06-06 LAB — PROTIME-INR
INR: 1.08
Prothrombin Time: 13.9 seconds (ref 11.4–15.2)

## 2018-06-06 IMAGING — RF DG LUMBAR SPINE 2-3V
1 series · 1 of 1 positions shown · non-contrast
Comparison: MRI lumbar spine 04/24/2018

CLINICAL DATA: L3-4 microdiscectomy.

EXAM:
DG C-ARM 61-120 MIN; LUMBAR SPINE - 2-3 VIEW

[Series 1: run · 1 of 1 slices shown]
[im 1/1]
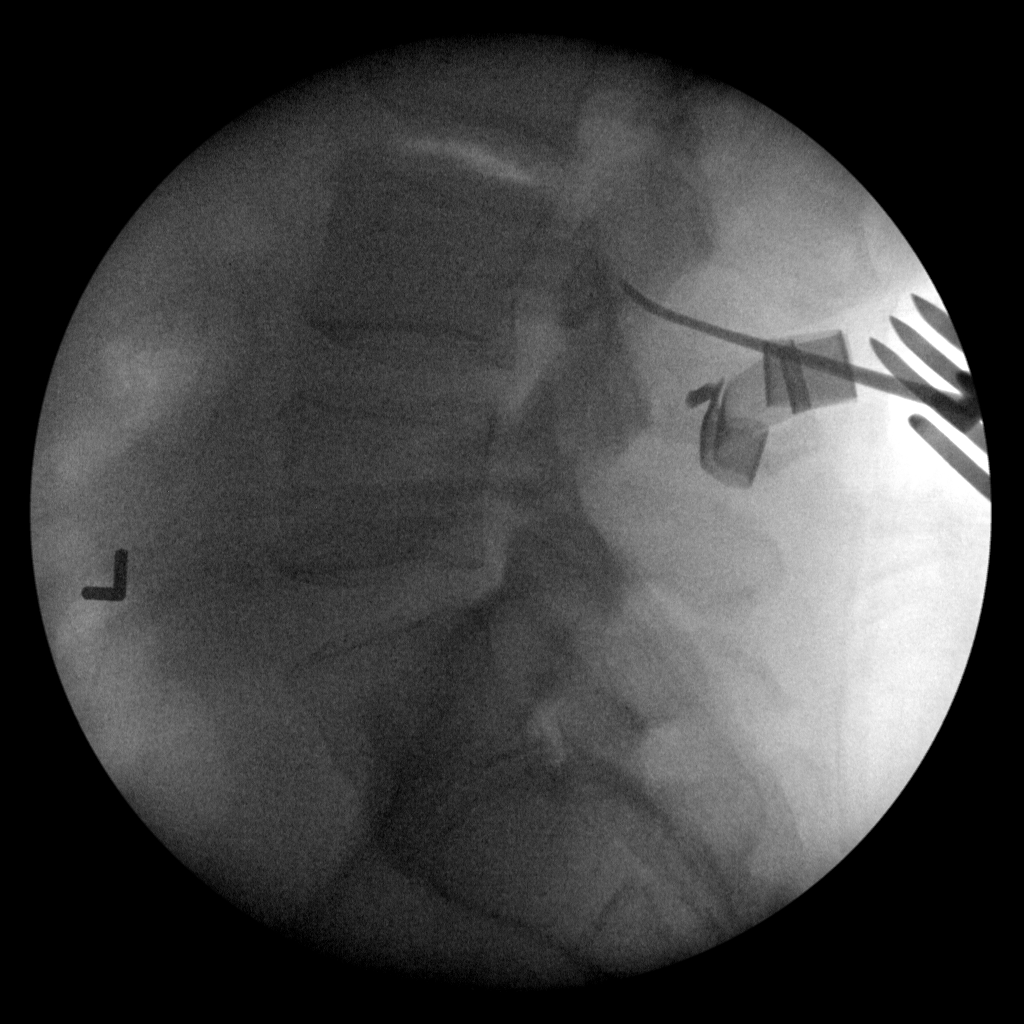

[1 of 1 positions shown; findings below may reference images not displayed]

FINDINGS: Intraoperative fluoroscopy is utilized for surgical control
purposes. Fluoroscopy time is recorded at 8 seconds. A single spot
fluoroscopic images obtained.

Spot fluoroscopic image obtained demonstrates a radiopaque marker
placed over the posterior elements at the level of the L4 pedicle.
Superficial sponge marker is present. A retractor is seen
superficially.

Note that there is a somewhat transitional lumbosacral vertebra.
Using the same segmental numbering convention as on the prior MRI,
the last well-formed intervertebral disc is reported as L5-S1 and
labeled as such on the image.
IMPRESSION: Intraoperative fluoroscopy utilized for surgical control purposes.

## 2018-06-06 SURGERY — LUMBAR LAMINECTOMY/DECOMPRESSION MICRODISCECTOMY 1 LEVEL
Anesthesia: General | Laterality: Right

## 2018-06-06 MED ORDER — ESMOLOL HCL 100 MG/10ML IV SOLN
INTRAVENOUS | Status: DC | PRN
  Administered 2018-06-06: 30 mg via INTRAVENOUS

## 2018-06-06 MED ORDER — ONDANSETRON HCL 4 MG/2ML IJ SOLN
INTRAMUSCULAR | Status: DC | PRN
  Administered 2018-06-06: 4 mg via INTRAVENOUS

## 2018-06-06 MED ORDER — DEXAMETHASONE SODIUM PHOSPHATE 10 MG/ML IJ SOLN
INTRAMUSCULAR | Status: AC
  Filled 2018-06-06: qty 1

## 2018-06-06 MED ORDER — OXYCODONE HCL 5 MG PO TABS
10.0000 mg | ORAL_TABLET | ORAL | Status: DC | PRN
  Administered 2018-06-06 – 2018-06-08 (×9): 10 mg via ORAL
  Filled 2018-06-06 (×9): qty 2

## 2018-06-06 MED ORDER — SUGAMMADEX SODIUM 200 MG/2ML IV SOLN
INTRAVENOUS | Status: DC | PRN
  Administered 2018-06-06: 300 mg via INTRAVENOUS

## 2018-06-06 MED ORDER — PROMETHAZINE HCL 25 MG/ML IJ SOLN: 6.2500 mg | INTRAMUSCULAR | Status: DC | PRN

## 2018-06-06 MED ORDER — HYDROMORPHONE HCL 1 MG/ML IJ SOLN
0.2500 mg | INTRAMUSCULAR | Status: DC | PRN
  Administered 2018-06-06 (×2): 0.5 mg via INTRAVENOUS

## 2018-06-06 MED ORDER — POTASSIUM CHLORIDE CRYS ER 10 MEQ PO TBCR
20.0000 meq | EXTENDED_RELEASE_TABLET | Freq: Every day | ORAL | Status: DC
  Administered 2018-06-07 – 2018-06-08 (×2): 20 meq via ORAL
  Filled 2018-06-06 (×3): qty 2

## 2018-06-06 MED ORDER — BUPIVACAINE LIPOSOME 1.3 % IJ SUSP
20.0000 mL | INTRAMUSCULAR | Status: DC
  Filled 2018-06-06: qty 20

## 2018-06-06 MED ORDER — METHOCARBAMOL 500 MG PO TABS
500.0000 mg | ORAL_TABLET | Freq: Four times a day (QID) | ORAL | Status: DC | PRN
  Administered 2018-06-06 – 2018-06-08 (×5): 500 mg via ORAL
  Filled 2018-06-06 (×5): qty 1

## 2018-06-06 MED ORDER — SUGAMMADEX SODIUM 500 MG/5ML IV SOLN
INTRAVENOUS | Status: AC
  Filled 2018-06-06: qty 5

## 2018-06-06 MED ORDER — LOSARTAN POTASSIUM 50 MG PO TABS
100.0000 mg | ORAL_TABLET | Freq: Every day | ORAL | Status: DC
  Administered 2018-06-07 – 2018-06-08 (×2): 100 mg via ORAL
  Filled 2018-06-06 (×3): qty 2

## 2018-06-06 MED ORDER — PROPOFOL 10 MG/ML IV BOLUS
INTRAVENOUS | Status: AC
  Filled 2018-06-06: qty 20

## 2018-06-06 MED ORDER — SODIUM CHLORIDE 0.9% FLUSH: 3.0000 mL | INTRAVENOUS | Status: DC | PRN

## 2018-06-06 MED ORDER — MIDAZOLAM HCL 2 MG/2ML IJ SOLN
INTRAMUSCULAR | Status: AC
  Filled 2018-06-06: qty 2

## 2018-06-06 MED ORDER — ACETAMINOPHEN 10 MG/ML IV SOLN
INTRAVENOUS | Status: DC | PRN
  Administered 2018-06-06: 1000 mg via INTRAVENOUS

## 2018-06-06 MED ORDER — FENTANYL CITRATE (PF) 250 MCG/5ML IJ SOLN
INTRAMUSCULAR | Status: AC
  Filled 2018-06-06: qty 5

## 2018-06-06 MED ORDER — OXYCODONE HCL 5 MG PO TABS: 5.0000 mg | ORAL_TABLET | ORAL | Status: DC | PRN

## 2018-06-06 MED ORDER — GLIMEPIRIDE 2 MG PO TABS: 3.0000 mg | ORAL_TABLET | Freq: Every day | ORAL | Status: DC

## 2018-06-06 MED ORDER — HYDROMORPHONE HCL 1 MG/ML IJ SOLN
INTRAMUSCULAR | Status: AC
  Administered 2018-06-06: 20:00:00
  Filled 2018-06-06: qty 1

## 2018-06-06 MED ORDER — EPHEDRINE 5 MG/ML INJ
INTRAVENOUS | Status: AC
  Filled 2018-06-06: qty 10

## 2018-06-06 MED ORDER — BUPIVACAINE-EPINEPHRINE 0.25% -1:200000 IJ SOLN
INTRAMUSCULAR | Status: DC | PRN
  Administered 2018-06-06: 10 mL

## 2018-06-06 MED ORDER — SUCCINYLCHOLINE CHLORIDE 200 MG/10ML IV SOSY
PREFILLED_SYRINGE | INTRAVENOUS | Status: DC | PRN
  Administered 2018-06-06: 100 mg via INTRAVENOUS

## 2018-06-06 MED ORDER — SODIUM CHLORIDE 0.9% FLUSH: 3.0000 mL | Freq: Two times a day (BID) | INTRAVENOUS | Status: DC

## 2018-06-06 MED ORDER — FUROSEMIDE 40 MG PO TABS
40.0000 mg | ORAL_TABLET | Freq: Two times a day (BID) | ORAL | Status: DC
  Administered 2018-06-07 – 2018-06-08 (×3): 40 mg via ORAL
  Filled 2018-06-06 (×3): qty 1

## 2018-06-06 MED ORDER — THROMBIN (RECOMBINANT) 20000 UNITS EX SOLR
CUTANEOUS | Status: AC
  Filled 2018-06-06: qty 20000

## 2018-06-06 MED ORDER — MENTHOL 3 MG MT LOZG: 1.0000 | LOZENGE | OROMUCOSAL | Status: DC | PRN

## 2018-06-06 MED ORDER — METOPROLOL SUCCINATE ER 100 MG PO TB24
200.0000 mg | ORAL_TABLET | Freq: Every day | ORAL | Status: DC
  Administered 2018-06-07 – 2018-06-08 (×2): 200 mg via ORAL
  Filled 2018-06-06 (×2): qty 2

## 2018-06-06 MED ORDER — OXYCODONE HCL 5 MG PO TABS
ORAL_TABLET | ORAL | Status: AC
  Administered 2018-06-06: 20:00:00
  Filled 2018-06-06: qty 1

## 2018-06-06 MED ORDER — METFORMIN HCL 500 MG PO TABS
1000.0000 mg | ORAL_TABLET | Freq: Two times a day (BID) | ORAL | Status: DC
  Administered 2018-06-07 – 2018-06-08 (×3): 1000 mg via ORAL
  Filled 2018-06-06 (×3): qty 2

## 2018-06-06 MED ORDER — THROMBIN 20000 UNITS EX SOLR
CUTANEOUS | Status: DC | PRN
  Administered 2018-06-06: 20 mL via TOPICAL

## 2018-06-06 MED ORDER — ACETAMINOPHEN 325 MG PO TABS
650.0000 mg | ORAL_TABLET | ORAL | Status: DC | PRN
  Administered 2018-06-06 – 2018-06-08 (×4): 650 mg via ORAL
  Filled 2018-06-06 (×4): qty 2

## 2018-06-06 MED ORDER — LACTATED RINGERS IV SOLN
INTRAVENOUS | Status: DC
  Administered 2018-06-06: 14:00:00 via INTRAVENOUS

## 2018-06-06 MED ORDER — ACETAMINOPHEN 650 MG RE SUPP: 650.0000 mg | RECTAL | Status: DC | PRN

## 2018-06-06 MED ORDER — ACETAMINOPHEN 10 MG/ML IV SOLN
INTRAVENOUS | Status: AC
  Filled 2018-06-06: qty 100

## 2018-06-06 MED ORDER — ROCURONIUM BROMIDE 50 MG/5ML IV SOSY
PREFILLED_SYRINGE | INTRAVENOUS | Status: AC
  Filled 2018-06-06: qty 5

## 2018-06-06 MED ORDER — PROPOFOL 10 MG/ML IV BOLUS
INTRAVENOUS | Status: DC | PRN
  Administered 2018-06-06: 200 mg via INTRAVENOUS

## 2018-06-06 MED ORDER — OXYCODONE HCL 5 MG/5ML PO SOLN: 5.0000 mg | Freq: Once | ORAL | Status: AC | PRN

## 2018-06-06 MED ORDER — METHOCARBAMOL 1000 MG/10ML IJ SOLN
500.0000 mg | Freq: Four times a day (QID) | INTRAVENOUS | Status: DC | PRN
  Filled 2018-06-06: qty 5

## 2018-06-06 MED ORDER — PHENYLEPHRINE 40 MCG/ML (10ML) SYRINGE FOR IV PUSH (FOR BLOOD PRESSURE SUPPORT)
PREFILLED_SYRINGE | INTRAVENOUS | Status: AC
  Filled 2018-06-06: qty 10

## 2018-06-06 MED ORDER — ONDANSETRON HCL 4 MG PO TABS: 4.0000 mg | ORAL_TABLET | Freq: Four times a day (QID) | ORAL | Status: DC | PRN

## 2018-06-06 MED ORDER — LACTATED RINGERS IV SOLN: INTRAVENOUS | Status: DC

## 2018-06-06 MED ORDER — HEMOSTATIC AGENTS (NO CHARGE) OPTIME
TOPICAL | Status: DC | PRN
  Administered 2018-06-06 (×2): 1 via TOPICAL

## 2018-06-06 MED ORDER — CEFAZOLIN SODIUM-DEXTROSE 1-4 GM/50ML-% IV SOLN
1.0000 g | Freq: Three times a day (TID) | INTRAVENOUS | Status: AC
  Administered 2018-06-07 (×2): 1 g via INTRAVENOUS
  Filled 2018-06-06 (×2): qty 50

## 2018-06-06 MED ORDER — DEXTROSE 5 % IV SOLN
INTRAVENOUS | Status: DC | PRN
  Administered 2018-06-06: 3 g via INTRAVENOUS

## 2018-06-06 MED ORDER — ROCURONIUM BROMIDE 10 MG/ML (PF) SYRINGE
PREFILLED_SYRINGE | INTRAVENOUS | Status: DC | PRN
  Administered 2018-06-06: 10 mg via INTRAVENOUS
  Administered 2018-06-06: 50 mg via INTRAVENOUS
  Administered 2018-06-06: 10 mg via INTRAVENOUS
  Administered 2018-06-06 (×2): 20 mg via INTRAVENOUS

## 2018-06-06 MED ORDER — MIDAZOLAM HCL 5 MG/5ML IJ SOLN
INTRAMUSCULAR | Status: DC | PRN
  Administered 2018-06-06: 2 mg via INTRAVENOUS

## 2018-06-06 MED ORDER — SODIUM CHLORIDE 0.9 % IV SOLN
INTRAVENOUS | Status: DC | PRN
  Administered 2018-06-06: 30 ug/min via INTRAVENOUS

## 2018-06-06 MED ORDER — BUPIVACAINE-EPINEPHRINE (PF) 0.25% -1:200000 IJ SOLN
INTRAMUSCULAR | Status: AC
  Filled 2018-06-06: qty 30

## 2018-06-06 MED ORDER — OXYCODONE HCL 5 MG PO TABS
5.0000 mg | ORAL_TABLET | Freq: Once | ORAL | Status: AC | PRN
  Administered 2018-06-06: 5 mg via ORAL

## 2018-06-06 MED ORDER — CEFAZOLIN SODIUM-DEXTROSE 2-4 GM/100ML-% IV SOLN
INTRAVENOUS | Status: AC
  Filled 2018-06-06: qty 100

## 2018-06-06 MED ORDER — LIDOCAINE 2% (20 MG/ML) 5 ML SYRINGE
INTRAMUSCULAR | Status: DC | PRN
  Administered 2018-06-06: 100 mg via INTRAVENOUS

## 2018-06-06 MED ORDER — EPHEDRINE SULFATE-NACL 50-0.9 MG/10ML-% IV SOSY
PREFILLED_SYRINGE | INTRAVENOUS | Status: DC | PRN
  Administered 2018-06-06 (×3): 5 mg via INTRAVENOUS
  Administered 2018-06-06: 15 mg via INTRAVENOUS
  Administered 2018-06-06: 5 mg via INTRAVENOUS

## 2018-06-06 MED ORDER — ONDANSETRON HCL 4 MG/2ML IJ SOLN: 4.0000 mg | Freq: Four times a day (QID) | INTRAMUSCULAR | Status: DC | PRN

## 2018-06-06 MED ORDER — ATORVASTATIN CALCIUM 40 MG PO TABS
40.0000 mg | ORAL_TABLET | Freq: Every day | ORAL | Status: DC
  Administered 2018-06-07: 40 mg via ORAL
  Filled 2018-06-06: qty 1

## 2018-06-06 MED ORDER — NITROGLYCERIN 0.4 MG SL SUBL: 0.4000 mg | SUBLINGUAL_TABLET | SUBLINGUAL | Status: DC | PRN

## 2018-06-06 MED ORDER — DEXAMETHASONE SODIUM PHOSPHATE 10 MG/ML IJ SOLN
INTRAMUSCULAR | Status: DC | PRN
  Administered 2018-06-06: 10 mg via INTRAVENOUS

## 2018-06-06 MED ORDER — LIDOCAINE 2% (20 MG/ML) 5 ML SYRINGE
INTRAMUSCULAR | Status: AC
  Filled 2018-06-06: qty 5

## 2018-06-06 MED ORDER — TRANEXAMIC ACID-NACL 1000-0.7 MG/100ML-% IV SOLN
1000.0000 mg | INTRAVENOUS | Status: AC
  Administered 2018-06-06: 1000 mg via INTRAVENOUS
  Filled 2018-06-06: qty 100

## 2018-06-06 MED ORDER — LEVOTHYROXINE SODIUM 25 MCG PO TABS
50.0000 ug | ORAL_TABLET | Freq: Every day | ORAL | Status: DC
  Administered 2018-06-07 – 2018-06-08 (×2): 50 ug via ORAL
  Filled 2018-06-06 (×2): qty 2

## 2018-06-06 MED ORDER — 0.9 % SODIUM CHLORIDE (POUR BTL) OPTIME
TOPICAL | Status: DC | PRN
  Administered 2018-06-06: 1000 mL

## 2018-06-06 MED ORDER — SODIUM CHLORIDE 0.9 % IV SOLN: 250.0000 mL | INTRAVENOUS | Status: DC

## 2018-06-06 MED ORDER — FENTANYL CITRATE (PF) 250 MCG/5ML IJ SOLN
INTRAMUSCULAR | Status: DC | PRN
  Administered 2018-06-06 (×6): 50 ug via INTRAVENOUS
  Administered 2018-06-06: 100 ug via INTRAVENOUS
  Administered 2018-06-06 (×3): 50 ug via INTRAVENOUS

## 2018-06-06 MED ORDER — PHENOL 1.4 % MT LIQD: 1.0000 | OROMUCOSAL | Status: DC | PRN

## 2018-06-06 MED ORDER — PHENYLEPHRINE 40 MCG/ML (10ML) SYRINGE FOR IV PUSH (FOR BLOOD PRESSURE SUPPORT)
PREFILLED_SYRINGE | INTRAVENOUS | Status: DC | PRN
  Administered 2018-06-06 (×2): 80 ug via INTRAVENOUS

## 2018-06-06 MED ORDER — LACTATED RINGERS IV SOLN
INTRAVENOUS | Status: DC | PRN
  Administered 2018-06-06 (×2): via INTRAVENOUS

## 2018-06-06 MED ORDER — METHYLPREDNISOLONE ACETATE 40 MG/ML IJ SUSP
INTRAMUSCULAR | Status: AC
  Filled 2018-06-06: qty 1

## 2018-06-06 MED ORDER — ONDANSETRON HCL 4 MG/2ML IJ SOLN
INTRAMUSCULAR | Status: AC
  Filled 2018-06-06: qty 2

## 2018-06-06 MED ORDER — METHYLPREDNISOLONE ACETATE 80 MG/ML IJ SUSP
INTRAMUSCULAR | Status: AC
  Filled 2018-06-06: qty 1

## 2018-06-06 MED ORDER — ACETAMINOPHEN 10 MG/ML IV SOLN: 1000.0000 mg | Freq: Once | INTRAVENOUS | Status: DC | PRN

## 2018-06-06 SURGICAL SUPPLY — 63 items
AGENT HMST KT MTR STRL THRMB (HEMOSTASIS) ×1
BNDG GAUZE ELAST 4 BULKY (GAUZE/BANDAGES/DRESSINGS) ×2 IMPLANT
BUR EGG ELITE 4.0 (BURR) IMPLANT
BUR MATCHSTICK NEURO 3.0 LAGG (BURR) IMPLANT
CANISTER SUCT 3000ML PPV (MISCELLANEOUS) ×2 IMPLANT
CLSR STERI-STRIP ANTIMIC 1/2X4 (GAUZE/BANDAGES/DRESSINGS) ×2 IMPLANT
CORD BI POLAR (MISCELLANEOUS) ×2 IMPLANT
COVER SURGICAL LIGHT HANDLE (MISCELLANEOUS) ×2 IMPLANT
COVER WAND RF STERILE (DRAPES) ×2 IMPLANT
DRAIN CHANNEL 15F RND FF W/TCR (WOUND CARE) IMPLANT
DRAPE POUCH INSTRU U-SHP 10X18 (DRAPES) ×2 IMPLANT
DRAPE SURG 17X23 STRL (DRAPES) ×2 IMPLANT
DRAPE U-SHAPE 47X51 STRL (DRAPES) ×2 IMPLANT
DRSG OPSITE POSTOP 3X4 (GAUZE/BANDAGES/DRESSINGS) ×2 IMPLANT
DRSG OPSITE POSTOP 4X6 (GAUZE/BANDAGES/DRESSINGS) ×1 IMPLANT
DURAPREP 26ML APPLICATOR (WOUND CARE) ×2 IMPLANT
ELECT BLADE 4.0 EZ CLEAN MEGAD (MISCELLANEOUS)
ELECT CAUTERY BLADE 6.4 (BLADE) ×2 IMPLANT
ELECT PENCIL ROCKER SW 15FT (MISCELLANEOUS) ×2 IMPLANT
ELECT REM PT RETURN 9FT ADLT (ELECTROSURGICAL) ×2
ELECTRODE BLDE 4.0 EZ CLN MEGD (MISCELLANEOUS) IMPLANT
ELECTRODE REM PT RTRN 9FT ADLT (ELECTROSURGICAL) ×1 IMPLANT
EVACUATOR SILICONE 100CC (DRAIN) IMPLANT
FLOSEAL 10ML (HEMOSTASIS) IMPLANT
GLOVE BIO SURGEON STRL SZ 6.5 (GLOVE) ×2 IMPLANT
GLOVE BIOGEL PI IND STRL 6.5 (GLOVE) ×1 IMPLANT
GLOVE BIOGEL PI IND STRL 8.5 (GLOVE) ×1 IMPLANT
GLOVE BIOGEL PI INDICATOR 6.5 (GLOVE) ×1
GLOVE BIOGEL PI INDICATOR 8.5 (GLOVE) ×1
GLOVE SS BIOGEL STRL SZ 8.5 (GLOVE) ×1 IMPLANT
GLOVE SUPERSENSE BIOGEL SZ 8.5 (GLOVE) ×1
GOWN STRL REUS W/ TWL LRG LVL3 (GOWN DISPOSABLE) ×2 IMPLANT
GOWN STRL REUS W/TWL 2XL LVL3 (GOWN DISPOSABLE) ×2 IMPLANT
GOWN STRL REUS W/TWL LRG LVL3 (GOWN DISPOSABLE) ×4
HEMOSTAT ARISTA ABSORB 3G PWDR (HEMOSTASIS) ×1 IMPLANT
KIT BASIN OR (CUSTOM PROCEDURE TRAY) ×2 IMPLANT
KIT TURNOVER KIT B (KITS) ×2 IMPLANT
NDL SPNL 18GX3.5 QUINCKE PK (NEEDLE) ×2 IMPLANT
NEEDLE 22X1 1/2 (OR ONLY) (NEEDLE) ×2 IMPLANT
NEEDLE SPNL 18GX3.5 QUINCKE PK (NEEDLE) ×4 IMPLANT
NS IRRIG 1000ML POUR BTL (IV SOLUTION) ×2 IMPLANT
PACK LAMINECTOMY ORTHO (CUSTOM PROCEDURE TRAY) ×2 IMPLANT
PACK UNIVERSAL I (CUSTOM PROCEDURE TRAY) ×2 IMPLANT
PAD ARMBOARD 7.5X6 YLW CONV (MISCELLANEOUS) ×4 IMPLANT
PATTIES SURGICAL .5 X.5 (GAUZE/BANDAGES/DRESSINGS) ×3 IMPLANT
PATTIES SURGICAL .5 X1 (DISPOSABLE) ×2 IMPLANT
SPONGE LAP 4X18 RFD (DISPOSABLE) ×1 IMPLANT
SPONGE SURGIFOAM ABS GEL 100 (HEMOSTASIS) ×1 IMPLANT
SURGIFLO W/THROMBIN 8M KIT (HEMOSTASIS) ×1 IMPLANT
SUT BONE WAX W31G (SUTURE) ×2 IMPLANT
SUT MON AB 3-0 SH 27 (SUTURE) ×2
SUT MON AB 3-0 SH27 (SUTURE) ×1 IMPLANT
SUT VIC AB 0 CT1 27 (SUTURE)
SUT VIC AB 0 CT1 27XBRD ANBCTR (SUTURE) IMPLANT
SUT VIC AB 1 CT1 18XCR BRD 8 (SUTURE) ×1 IMPLANT
SUT VIC AB 1 CT1 8-18 (SUTURE) ×2
SUT VIC AB 2-0 CT1 18 (SUTURE) ×2 IMPLANT
SYR BULB IRRIGATION 50ML (SYRINGE) ×2 IMPLANT
SYR CONTROL 10ML LL (SYRINGE) ×2 IMPLANT
TOWEL GREEN STERILE (TOWEL DISPOSABLE) ×2 IMPLANT
TOWEL GREEN STERILE FF (TOWEL DISPOSABLE) ×2 IMPLANT
WATER STERILE IRR 1000ML POUR (IV SOLUTION) ×2 IMPLANT
YANKAUER SUCT BULB TIP NO VENT (SUCTIONS) ×1 IMPLANT

## 2018-06-06 NOTE — Op Note (Signed)
Operative report  Preoperative diagnosis: Right L3-4 disc herniation.  L3 pars defect (isthmic spondylolisthesis grade 1)  Postoperative diagnosis same  Operative procedure: Right Gill decompression with foraminotomy and discectomy/decompression.  Complications: None  Condition: Stable  EBL: 350 cc  History: [name] is a very pleasant 53 year old gentleman with multiple medical issues who presents with severe right radicular leg pain.  While he has had back pain in the past he is been able to live with it.  He recently developed severe leg pain.  The leg pain is because a dramatic change in his quality of life.  Subsequently an MRI demonstrated a posterior lateral disc herniation extending superiorly and into the L3 foramen.  This was causing marked compression of the lateral recess as well as the L3 foramen.  Attempts at conservative management failed to alleviate his symptoms and so he elected to move forward with surgical decompression and discectomy.  All appropriate risks benefits and alternative surgery were discussed with the patient and consent was obtained.  Patient will start preoperative anticoagulants and was instructed to stop them preoperatively.  He continued his Lovenox up until yesterday.  Operative report patient is brought the operating room placed upon the operating room table.  After successful induction of general anesthesia and endotracheal patient teds SCDs were applied and he was turned prone onto the [place] frame.  All bony problems are well-padded.  During the positioning of the patient he was elevated been in his head struck the top of the [place] frame.  There was no bleeding or ecchymosis noted.  Decision was made to monitor this.  Once he was positioned in the back was prepped and draped in a standard fashion.  A timeout was taken to confirm patient procedure and all other important data.  2 needles were placed in the back and an intraoperative fluoroscopy view was taken  confirming the L3-4 level.  The incision site was marked out and infiltrated with quarter percent Marcaine with epinephrine midline incision was made and sharp dissection was carried out down to the deep fascia.  The deep fascia was sharply incised and I used a Cobb elevator to strip the paraspinal muscles to expose the L3 spinous process and lamina as well as the L4 spinous process and the L3-4 facet complex.  Hemostasis was obtained using bipolar cautery.  There was significant bleeding noted consistent with preoperative use of Lovenox.  Once we had controlled the bleeding I continued the approach.  A Penfield 4 was placed under the L3 lamina and an intraoperative x-ray was taken confirming that I was at the appropriate level.  Once this was confirmed I used a 2 to 3 mm Kerrison punch to perform a generous laminotomy of L3.  This allowed me to identify the L3 pars defect and resect the inferior L3 facet.  Once this was done I was able to take down the pars and perform an adequate foraminotomy.  I then used a Penfield 4 to dissect through the ligamentum flavum and create a plane between the thecal sac and the ligamentum flavum.  I used a 2 and 3 mm Kerrison punch to resect the ligamentum flavum and exposed the dorsal surface of the thecal sac.  This allowed me to remove the medial portion of the remaining superior L4 facet to adequately decompress the lateral recess.  At this point I was able to visualize the L4 nerve root.  There were very large epidural veins with active bleeding.  These were all coagulated with  bipolar cautery and packed with a thrombin-soaked Gelfoam patty and neuro patty.  As I began to work superiorly I noted the large fragments of disc material in the lateral recess and extending into the L3 foramen.  Using a nerve hook I was able to eventually sweep and deliver out multiple large fragments of disc material consistent with what was seen on the preoperative MRI.  An annulotomy was performed  and I also palpated in the subannular region and in the disc space to remove any remaining free fragments of disc material.  Once all the material is out I could visualize the L3 nerve root and it was freely mobile and well decompressed as it entered the foramen.  I could also palpate centrally at the level of the disc space and there was no further central compression.  Inferiorly I could palpate down to the lateral recess and out the L4 foramen and there was adequate decompression.  Again the wound into this point was copiously irrigated with normal saline and the epidural veins were coagulated with bipolar cautery.  FloSeal was then placed into the wound.  After about 5 minutes I irrigated the wound copiously normal saline and utilized Avitene as an additional hemostatic agent.  A thrombin-soaked Gelfoam patty was placed over the laminotomy defect and I placed a deep drain.  This was brought out of a separate stab incision.  At this point the deep fascia was closed in a layered fashion with interrupted #1 Vicryl sutures then 2-0 Vicryl sutures and then finally 3-0 Monocryl for the skin.  Steri-Strips and dry dressings were applied and the patient was ultimately extubated transfer the PACU without incident.  The end of the case all needle sponge counts were correct.  There was no significant swelling or hematoma formation on the top of his head where it struck the [place] frame.

## 2018-06-06 NOTE — Anesthesia Postprocedure Evaluation (Signed)
Anesthesia Post Note  Patient: [name]  Procedure(s) Performed: Right L3-4 disectomy (Right )     Patient location during evaluation: PACU Anesthesia Type: General Level of consciousness: awake and alert Pain management: pain level controlled Vital Signs Assessment: post-procedure vital signs reviewed and stable Respiratory status: spontaneous breathing, nonlabored ventilation and respiratory function stable Cardiovascular status: blood pressure returned to baseline and stable Postop Assessment: no apparent nausea or vomiting Anesthetic complications: no    Last Vitals:  Vitals:   06/06/18 2050 06/06/18 2250  BP: 134/87 120/73  Pulse: 81 88  Resp: 20 20  Temp: (!) 36.3 C 36.6 C  SpO2: 92% 98%    Last Pain:  Vitals:   06/06/18 2318  TempSrc:   PainSc: [place]

## 2018-06-06 NOTE — Consult Note (Signed)
Medical Consultation   [name]  SWF:[id number]  DOB: 04-Apr-1966  DOA: 06/06/2018  PCP: Zachary Borg, MD   Outpatient Specialists: Dr. Stanford [name] (cardiology)   Requesting physician: Dr. Rolena [name] (orthopedic)   Reason for consultation: Glycemic-control   History of Present Illness: [name] is an 53 y.o. male CAD status post CABG, ascending aortic dissection status post Bentall procedure and mechanical AVR, non-insulin dependent DM, hypertension, hypothyroidism, and right L3-4 HNP admitted to the hospital by orthopedic surgery today for L3-4 discectomy. He is immediately status-post L3-4 discectomy, tolerated well and sent to PACU in stable condition. He uses metformin and glimepiride for DM, reports intolerance to insulin glargine, is not willing to try another formulation, and hospitalists are asked to assist with glycemic-control while in hospital.   Patient interviewed and examined shortly after surgery; he reports "I can feel my leg now and the back feels much better."     Review of Systems:  ROS As per HPI otherwise 10 point review of systems negative.   Past Medical History: Past Medical History:  Diagnosis Date  . Ascending aortic dissection ([place])    a. 2000 - with aortic valve involvement. S/p repair of aortic dissection with placement of mechanical AVR.  [name] CAD (coronary artery disease)    a. 2000 VG->PDA @ time of Ao dissection repair;  b. Cardiac CT 06/2010: no CAD, only mild plaque in prox RCA;  c. 2015 Cath: LM nl, LAD nl, LCX nl, RCA 100, VG->PDA 90 (4.5x12 Rebel BMS). d. 10/23/17 instent restenosis SVG to RCA, DES placed.  . Cholelithiasis 08/22/2013  . Chronic diastolic CHF (congestive heart failure) ([place])    a. 03/2016 Echo: EF 55-60%, no rwma, Gr2 DD.  . Diabetes mellitus   . Dyspnea   . Gross hematuria   . H/O mechanical aortic valve replacement    a. 03/2016 Echo: AoV mean gradient 46mmHg, Valve Area (VTI) 1.36 cm^2, (Vmax) 1.22 cm^2,  mod dil LA.  [name] Hyperlipidemia   . Hypertension   . NEPHROLITHIASIS, HX OF   . Obesity   . PAF (paroxysmal atrial fibrillation) ([place])    a. H/o such, with recurrence of coarse afib/flutter during ER consult 03/2012.  . Stroke ([place])   . TRANSIENT ISCHEMIC ATTACK, HX OF    a. In 2004.  [name] Unspecified vitamin D deficiency     Past Surgical History: Past Surgical History:  Procedure Laterality Date  . AORTIC VALVE REPLACEMENT  2000  . APPENDECTOMY  1998  . CORONARY ARTERY BYPASS GRAFT  2000  . CORONARY STENT INTERVENTION N/A 10/23/2017   Procedure: CORONARY STENT INTERVENTION;  Surgeon: [name], MD;  Location: [facility];  Service: Cardiovascular;  Laterality: N/A;  . LEFT AND RIGHT HEART CATHETERIZATION WITH CORONARY ANGIOGRAM N/A 07/19/2013   Procedure: LEFT AND RIGHT HEART CATHETERIZATION WITH CORONARY ANGIOGRAM;  Surgeon: [name], MD;  Location: [facility] CATH LAB;  Service: Cardiovascular;  Laterality: N/A;  . RIGHT HEART CATH AND CORONARY/GRAFT ANGIOGRAPHY N/A 10/23/2017   Procedure: RIGHT HEART CATH AND CORONARY/GRAFT ANGIOGRAPHY;  Surgeon: [name], MD;  Location: [facility];  Service: Cardiovascular;  Laterality: N/A;  . TEE WITHOUT CARDIOVERSION N/A 07/22/2013   Procedure: TRANSESOPHAGEAL ECHOCARDIOGRAM (TEE);  Surgeon: [name], MD;  Location: [facility];  Service: Cardiovascular;  Laterality: N/A;     Allergies:   Allergies  Allergen Reactions  . Diltiazem Hives  .  Insulin Glargine Swelling    edema     Social History:  reports that he has never smoked. He has never used smokeless tobacco. He reports that he does not drink alcohol or use drugs.   Family History: Family History  Problem Relation Age of Onset  . Coronary artery disease Mother   . Hypertension Mother   . Diabetes Brother   . Coronary artery disease Other 65       male 1st degree relative, CABG in his 108's (father)    Physical Exam: Vitals:    06/06/18 1930 06/06/18 1938 06/06/18 1945 06/06/18 2000  BP: 125/76  125/80   Pulse: 85 85 84 83  Resp: 16 17 19 15   Temp:      TempSrc:      SpO2: 93% 92% 92% 93%  Weight:      Height:        Constitutional: Alert and awake, oriented x3, not in any acute distress. Eyes: PERLA, EOMI, irises appear normal, anicteric sclera,  ENMT: external ears and nose appear normal, lips appears normal, oropharynx mucosa   Neck: neck appears normal, no masses, normal ROM, no thyromegaly, no JVD  CVS: S1-S2 clear, no murmur rubs or gallops, normal pedal pulses  Respiratory:  clear to auscultation bilaterally, no wheezing, rales or rhonchi. Respiratory effort normal.    Abdomen: soft, nontender, nondistended, active bowel sounds  Musculoskeletal: : no cyanosis or clubbing; drain in low back with scant serosanguinous output   Neuro: Cranial nerves II-XII intact, strength, sensation, reflexes Psych: judgement and insight appear normal, stable mood and affect, mental status Skin: no rashes or lesions or ulcers, no induration or nodules    Data reviewed:  I have personally reviewed following labs and imaging studies Labs:  CBC: Recent Labs  Lab 05/31/18 1354  WBC 5.6  HGB 13.8  HCT 40.1  MCV 81.5  PLT 465    Basic Metabolic Panel: Recent Labs  Lab 05/31/18 1354  NA 138  K 3.3*  CL 103  CO2 26  GLUCOSE 139*  BUN 9  CREATININE 0.95  CALCIUM 8.8*   GFR Estimated Creatinine Clearance: 125.7 mL/min (by C-G formula based on SCr of 0.95 mg/dL). Liver Function Tests: No results for input(s): AST, ALT, ALKPHOS, BILITOT, PROT, ALBUMIN in the last 168 hours. No results for input(s): LIPASE, AMYLASE in the last 168 hours. No results for input(s): AMMONIA in the last 168 hours. Coagulation profile Recent Labs  Lab 06/06/18 1423  INR 1.08    Cardiac Enzymes: No results for input(s): CKTOTAL, CKMB, CKMBINDEX, TROPONINI in the last 168 hours. BNP: Invalid input(s): POCBNP CBG: Recent  Labs  Lab 05/31/18 1305 06/06/18 1340 06/06/18 1535 06/06/18 1921  GLUCAP 160* 118* 104* 130*   D-Dimer No results for input(s): DDIMER in the last 72 hours. Hgb A1c No results for input(s): HGBA1C in the last 72 hours. Lipid Profile No results for input(s): CHOL, HDL, LDLCALC, TRIG, CHOLHDL, LDLDIRECT in the last 72 hours. Thyroid function studies No results for input(s): TSH, T4TOTAL, T3FREE, THYROIDAB in the last 72 hours.  Invalid input(s): FREET3 Anemia work up No results for input(s): VITAMINB12, FOLATE, FERRITIN, TIBC, IRON, RETICCTPCT in the last 72 hours. Urinalysis    Component Value Date/Time   COLORURINE YELLOW 08/31/2017 0906   APPEARANCEUR CLEAR 08/31/2017 0906   LABSPEC <=1.005 (A) 08/31/2017 0906   PHURINE 6.0 08/31/2017 0906   GLUCOSEU 100 (A) 08/31/2017 0906   HGBUR NEGATIVE 08/31/2017 0906   BILIRUBINUR NEGATIVE  08/31/2017 0906   KETONESUR NEGATIVE 08/31/2017 0906   PROTEINUR 30 (A) 12/31/2009 1606   UROBILINOGEN 1.0 08/31/2017 0906   NITRITE NEGATIVE 08/31/2017 0906   LEUKOCYTESUR NEGATIVE 08/31/2017 8182     Microbiology Recent Results (from the past 240 hour(s))  Surgical pcr screen     Status: Abnormal   Collection Time: 05/31/18  1:54 PM  Result Value Ref Range Status   MRSA, PCR NEGATIVE NEGATIVE Final   Staphylococcus aureus POSITIVE (A) NEGATIVE Final    Comment: (NOTE) The Xpert SA Assay (FDA approved for NASAL specimens in patients 34 years of age and older), is one component of a comprehensive surveillance program. It is not intended to diagnose infection nor to guide or monitor treatment. Performed at [facility], [place] [address] [place], [place] [address]        Inpatient Medications:   Scheduled Meds: . bupivacaine liposome  20 mL Infiltration To OR  . HYDROmorphone      . HYDROmorphone      . oxyCODONE       Continuous Infusions: . acetaminophen    . ceFAZolin    .  ceFAZolin (ANCEF) IV    . lactated ringers  10 mL/hr at 06/06/18 1419     Radiological Exams on Admission: Dg Lumbar Spine 2-3 Views  Result Date: 06/06/2018 CLINICAL DATA:  L3-4 microdiscectomy. EXAM: DG C-ARM 61-120 MIN; LUMBAR SPINE - 2-3 VIEW COMPARISON:  MRI lumbar spine 04/24/2018 FINDINGS: Intraoperative fluoroscopy is utilized for surgical control purposes. Fluoroscopy time is recorded at 8 seconds. A single spot fluoroscopic images obtained. Spot fluoroscopic image obtained demonstrates a radiopaque marker placed over the posterior elements at the level of the L4 pedicle. Superficial sponge marker is present. A retractor is seen superficially. Note that there is a somewhat transitional lumbosacral vertebra. Using the same segmental numbering convention as on the prior MRI, the last well-formed intervertebral disc is reported as L5-S1 and labeled as such on the image. IMPRESSION: Intraoperative fluoroscopy utilized for surgical control purposes. Electronically Signed   By: Lucienne Capers M.D.   On: 06/06/2018 19:27   Dg C-arm 1-60 Min  Result Date: 06/06/2018 CLINICAL DATA:  L3-4 microdiscectomy. EXAM: DG C-ARM 61-120 MIN; LUMBAR SPINE - 2-3 VIEW COMPARISON:  MRI lumbar spine 04/24/2018 FINDINGS: Intraoperative fluoroscopy is utilized for surgical control purposes. Fluoroscopy time is recorded at 8 seconds. A single spot fluoroscopic images obtained. Spot fluoroscopic image obtained demonstrates a radiopaque marker placed over the posterior elements at the level of the L4 pedicle. Superficial sponge marker is present. A retractor is seen superficially. Note that there is a somewhat transitional lumbosacral vertebra. Using the same segmental numbering convention as on the prior MRI, the last well-formed intervertebral disc is reported as L5-S1 and labeled as such on the image. IMPRESSION: Intraoperative fluoroscopy utilized for surgical control purposes. Electronically Signed   By: Lucienne Capers M.D.   On: 06/06/2018 19:27     Impression/Recommendations  1. Lumbar disc herniation  - Status-post L3-4 discectomy 06/06/18  - Patient reports RLE numbness has resolved following surgery, is neurovascularly intact, has a drain with scant serosanguinous output  - Continue post-op care per ortho   2. Type II DM  - A1c was 6.2% in October  - Managed at home with metformin and glimepiride  - Reports hx of swelling with insulin glargine and has been continued on his home medications  - CBG 104-130 so far  - Continue home medications; check CBG's with meals,  qHS, and for suspected hypoglycemia   3. CAD  - Status-post CABG and more recent stenting  - Denies any anginal sxs since his stent was placed in June '19  - Continue antiplatelet, statin, beta-blocker, ARB   4. PAF  - In a regular rhythm  - CHADS-VASc 4 (CAD, DM, HTN, CHF)  - Coumadin held with plan to resume with Lovenox   5. HTN  - BP at goal  - Continue Toprol, Norvasc, losartan    6. Hypothyroidism  - Continue Synthroid   7. Chronic diastolic CHF  - Appears compensated   - Documented wt is ~20 lbs lower than in October, ?accuracy  - Continue Lasix, ARB, beta-blocker, daily wt      Thank you for this consultation.  Our [facility] hospitalist team will follow the patient with you.   Time Spent: 47 minutes.   Ilene Qua Rosann Gorum M.D. [facility] 06/06/2018, 8:05 PM

## 2018-06-06 NOTE — Transfer of Care (Signed)
Immediate Anesthesia Transfer of Care Note  Patient: [name]  Procedure(s) Performed: Right L3-4 disectomy (Right )  Patient Location: PACU  Anesthesia Type:General  Level of Consciousness: awake, alert  and oriented  Airway & Oxygen Therapy: Patient Spontanous Breathing and Patient connected to face mask oxygen  Post-op Assessment: Report given to RN, Post -op Vital signs reviewed and stable, Patient moving all extremities and Patient moving all extremities X 4  Post vital signs: Reviewed and stable  Last Vitals:  Vitals Value Taken Time  BP 136/78 06/06/2018  7:19 PM  Temp 36.7 C 06/06/2018  7:17 PM  Pulse 85 06/06/2018  7:23 PM  Resp 20 06/06/2018  7:23 PM  SpO2 97 % 06/06/2018  7:23 PM  Vitals shown include unvalidated device data.  Last Pain:  Vitals:   06/06/18 1413  TempSrc:   PainSc: 5       Patients Stated Pain Goal: 4 (16/83/72 9021)  Complications: No apparent anesthesia complications

## 2018-06-06 NOTE — Brief Op Note (Signed)
06/06/2018  7:10 PM  PATIENT:  [name]  53 y.o. male  PRE-OPERATIVE DIAGNOSIS:  Right L3-4 HNP  POST-OPERATIVE DIAGNOSIS:  Right L3-4 HNP  PROCEDURE:  Procedure(s) with comments: Right L3-4 disectomy (Right) - 2.5 hrs  SURGEON:  Surgeon(s) and Role:    Melina Schools, MD - Primary  PHYSICIAN ASSISTANT:   ASSISTANTS: none   ANESTHESIA:   general  EBL:  350 mL   BLOOD ADMINISTERED:none  DRAINS: 1 JP in the back   LOCAL MEDICATIONS USED:  MARCAINE     SPECIMEN:  No Specimen  DISPOSITION OF SPECIMEN:  N/A  COUNTS:  YES  TOURNIQUET:  * No tourniquets in log *  DICTATION: .Dragon Dictation  PLAN OF CARE: Admit to inpatient   PATIENT DISPOSITION:  PACU - hemodynamically stable.

## 2018-06-06 NOTE — Anesthesia Procedure Notes (Signed)
Procedure Name: Intubation Date/Time: 06/06/2018 4:32 PM Performed by: Jearld Pies, CRNA Pre-anesthesia Checklist: Patient identified, Emergency Drugs available, Suction available and Patient being monitored Patient Re-evaluated:Patient Re-evaluated prior to induction Oxygen Delivery Method: Circle System Utilized Preoxygenation: Pre-oxygenation with 100% oxygen Induction Type: IV induction and Rapid sequence Laryngoscope Size: Mac and 4 Grade View: Grade I Tube type: Oral Tube size: 7.5 mm Number of attempts: 1 Airway Equipment and Method: Stylet and Oral airway Placement Confirmation: ETT inserted through vocal cords under direct vision,  positive ETCO2 and breath sounds checked- equal and bilateral Secured at: 22 cm Tube secured with: Tape Dental Injury: Teeth and Oropharynx as per pre-operative assessment

## 2018-06-06 NOTE — H&P (Signed)
History of Present Illness Right L3-4 Discectomy on 06/06/2018 Patient is scheduled for an L3-4 Discectomy on 06/06/2018 for low back and right leg radiculopathy. He has gotten clearance from PCP and cardiologist. He has an appmt scheduled for anasthesia at the hospital as well as with PT for a brace fitting. Of not, patient is on Coumadin.  Patient complained of swelling in the left arm.  No recent trauma or injury.  No difficulty moving the arm.  Allergies NKDA  Medications amLODIPine 10 mg tablet atorvastatin 40 mg tablet clopidogreL 75 mg tablet dilTIAZem 30 mg tablet furosemide 40 mg tablet gabapentin 300 mg capsule metoprolol succinate ER 200 mg tablet,extended release 24 hr traMADoL 50 mg tablet Vitamin D2 1,250 mcg (50,000 unit) capsule warfarin 5 mg tablet  Problems Hypercholesterolemia - Onset: 05/29/2018 Hypertensive disorder - Onset: 05/29/2018 Open heart surgery - Onset: 05/29/2018 Degeneration of lumbar intervertebral disc - Onset: 05/14/2018 Radiculopathy due to lumbar intervertebral disc disorder - Onset: 05/14/2018 - L3-4 Isthmic spondylolisthesis - Onset: 05/14/2018 - L3-4  Family History Reviewed Family History Father - Heart disease   - Heart disease  Social History Non-smoker Occupation: disability  Surgical History Cardiac Valve Replacement - 05/23/2017  Past Medical History Diabetes: Y Heart Problems: Y Poor Circulation: Y  Review of Systems Musculoskeletal Musculoskeletal: back pain  Neurologic Neurologic: numbness (right leg) Reported by Patient CONSTITUTIONAL: WEIGHT LOSS, but no Fever, no Weight Gain  HEAD, EARS, EYES, NOSE, AND THROAT: no Dry Eyes, no Eye Irritation, no Vision Changes, no Sore Throat  CARDIOVASCULAR: POOR CIRCULATION, but no Chest Pain / Angina, no Palpitations, no Leg Swelling  RESPIRATORY: no Cough, no Shortness of Breath, no COPD, no Asthma  GASTROINTESTINAL: no Nausea / Vomiting, no Black Stools / Blood in  Stool, no Diarrhea, no Vomiting Blood  GENITOURINARY: no Blood in Urine, no Difficulty Urinating  MUSCULOSKELETAL: no Joint Pain, no Joint Swelling, no Rheumatoid Arthritis, no Lost 2 or more inches in Height  SKIN: no Rash, no Varicose Veins  NEUROLOGIC: NUMBNESS, but no Seizures, no Dizziness, no Problems with Balance  PSYCHIATRIC: no Anxiety, no Depression  ENDOCRINE: no Heat Intolerance  BLOOD / LYMPH: no Anemia, no Bleed Easily, no Bruise Easily, no Swollen Glands  Physical Exam Clinical exam: Octavia Bruckner is a pleasant individual, who appears younger than their stated age. He Is alert and orientated 3. No shortness of breath, chest pain. Abdomen is soft and non-tender, negative loss of bowel and bladder control, no rebound tenderness. Negative: skin lesions abrasions contusions  Peripheral pulses: 1+ dorsalis pedis/posterior tibialis pulses bilaterally. Compartment soft and nontender.  Gait pattern: Patient has significant right radicular leg pain causing a limp.  Assistive devices: No assistive devices  Neuro: 4 out of 5 right hip flexor and quad weakness. The remainder of his exam is 5 out of 5. Positive femoral stretch test on the right side. Positive numbness and dysesthesias in the right L3 and L4 dermatome. Negative Babinski test, negative Hoffman test, no clonus. 1+ deep tendon reflexes bilaterally in lower extremity  Musculoskeletal: Significant back pain radiating into the right lower extremity. No significant hip, knee, ankle pain with isolated joint range of motion.  X-rays of the lumbar spine demonstrate a slight anterolisthesis at L3-4 with advanced degenerative disc disease. No acute fracture is seen.  Lumbar MRI dated 04/24/18: Large disc extrusion L3-4 on the right side with compression of the exiting L3 and traversing L4 nerve root. Chronic bilateral L3 pars interarticularis defect and a slight increase in a  grade 1 anterolisthesis at L3-4. Mild degenerative changes in  the remainder of the spine.  Assessment & Plan Diagnosis: Deanthony is a very pleasant 53 year old gentleman who has had on-again off-again back problems but nothing of any significance. Approximately 3 months ago his back pain got acutely worse and then 1 month ago he started having horrific right radicular leg pain. Imaging study confirms a disc herniation at L3-4 causing right L4 nerve compression. Patient's clinical exam consistent with right radiculopathy the L4 nerve root. Treatment plan: At this point time we discussed surgical and nonsurgical management. Given the size of the sequestered fragment I do not think injection therapy would be of any benefit. The patient would like to move forward with surgery. At this point since his main problem is the radiculopathy I recommended only moving forward with a lumbar decompression discectomy. I did inform him that in the future the slip could progress and that he may require fusion surgery. He is expressed understanding of this and realizes that this may not be his only operation.  I have gone over the surgical procedure in great detail and all of his questions were addressed. Risks and benefits of surgery were discussed with the patient. These include: Infection, bleeding, death, stroke, paralysis, ongoing or worse pain, need for additional surgery, leak of spinal fluid, adjacent segment degeneration requiring additional surgery, post-operative hematoma formation that can result in neurological compromise and the need for urgent/emergent re-operation. Loss in bowel and bladder control. Injury to major vessels that could result in the need for urgent abdominal surgery to stop bleeding. Risk of deep venous thrombosis (DVT) and the need for additional treatment. Recurrent disc herniation resulting in the need for revision surgery, which could include fusion surgery (utilizing instrumentation such as pedicle screws and intervertebral cages).  Additional risk: If  instrumentation is used there is a risk of migration, or breakage of that hardware that could require additional surgery.

## 2018-06-07 ENCOUNTER — Encounter (HOSPITAL_COMMUNITY): Payer: Self-pay | Admitting: Orthopedic Surgery

## 2018-06-07 ENCOUNTER — Telehealth: Payer: Self-pay | Admitting: Cardiology

## 2018-06-07 ENCOUNTER — Other Ambulatory Visit: Payer: Self-pay

## 2018-06-07 ENCOUNTER — Observation Stay (HOSPITAL_COMMUNITY): Payer: Medicare HMO

## 2018-06-07 DIAGNOSIS — I480 Paroxysmal atrial fibrillation: Secondary | ICD-10-CM | POA: Diagnosis not present

## 2018-06-07 DIAGNOSIS — M5126 Other intervertebral disc displacement, lumbar region: Secondary | ICD-10-CM | POA: Diagnosis not present

## 2018-06-07 DIAGNOSIS — E559 Vitamin D deficiency, unspecified: Secondary | ICD-10-CM | POA: Diagnosis not present

## 2018-06-07 DIAGNOSIS — I2510 Atherosclerotic heart disease of native coronary artery without angina pectoris: Secondary | ICD-10-CM | POA: Diagnosis not present

## 2018-06-07 DIAGNOSIS — E7800 Pure hypercholesterolemia, unspecified: Secondary | ICD-10-CM | POA: Diagnosis not present

## 2018-06-07 DIAGNOSIS — M4316 Spondylolisthesis, lumbar region: Secondary | ICD-10-CM | POA: Diagnosis not present

## 2018-06-07 DIAGNOSIS — R51 Headache: Secondary | ICD-10-CM | POA: Diagnosis not present

## 2018-06-07 DIAGNOSIS — I110 Hypertensive heart disease with heart failure: Secondary | ICD-10-CM | POA: Diagnosis not present

## 2018-06-07 DIAGNOSIS — I5032 Chronic diastolic (congestive) heart failure: Secondary | ICD-10-CM | POA: Diagnosis not present

## 2018-06-07 DIAGNOSIS — M5116 Intervertebral disc disorders with radiculopathy, lumbar region: Secondary | ICD-10-CM | POA: Diagnosis not present

## 2018-06-07 LAB — GLUCOSE, CAPILLARY
GLUCOSE-CAPILLARY: 215 mg/dL — AB (ref 70–99)
Glucose-Capillary: 158 mg/dL — ABNORMAL HIGH (ref 70–99)
Glucose-Capillary: 123 mg/dL — ABNORMAL HIGH (ref 70–99)
Glucose-Capillary: 98 mg/dL (ref 70–99)

## 2018-06-07 LAB — CBC
HCT: 32.7 % — ABNORMAL LOW (ref 39.0–52.0)
Hemoglobin: 11.4 g/dL — ABNORMAL LOW (ref 13.0–17.0)
MCH: 28.4 pg (ref 26.0–34.0)
MCHC: 34.9 g/dL (ref 30.0–36.0)
MCV: 81.5 fL (ref 80.0–100.0)
Platelets: 166 10*3/uL (ref 150–400)
RBC: 4.01 MIL/uL — ABNORMAL LOW (ref 4.22–5.81)
RDW: 15.4 % (ref 11.5–15.5)
WBC: 8.7 10*3/uL (ref 4.0–10.5)
nRBC: 0 % (ref 0.0–0.2)

## 2018-06-07 LAB — BASIC METABOLIC PANEL
Anion gap: 8 (ref 5–15)
BUN: 12 mg/dL (ref 6–20)
CHLORIDE: 104 mmol/L (ref 98–111)
CO2: 26 mmol/L (ref 22–32)
CREATININE: 1.19 mg/dL (ref 0.61–1.24)
Calcium: 8.7 mg/dL — ABNORMAL LOW (ref 8.9–10.3)
GFR calc Af Amer: >60 mL/min (ref >60)
GFR calc non Af Amer: >60 mL/min (ref >60)
GLUCOSE: 185 mg/dL — AB (ref 70–99)
Potassium: 4.6 mmol/L (ref 3.5–5.1)
Sodium: 138 mmol/L (ref 135–145)

## 2018-06-07 LAB — HEMOGLOBIN A1C
Hgb A1c MFr Bld: 7 % — ABNORMAL HIGH (ref 4.8–5.6)
Mean Plasma Glucose: 154.2 mg/dL

## 2018-06-07 IMAGING — CT CT HEAD W/O CM
4 series · 15 of 47 positions shown, 17 images · non-contrast
Comparison: June 05, 2014

CLINICAL DATA: Headache after questionable trauma during surgical
procedure

EXAM:
CT HEAD WITHOUT CONTRAST
TECHNIQUE: Contiguous axial images were obtained from the base of the skull
through the vertex without intravenous contrast.

[Series 3: head wo · axial · 0.45mm/px · z∈[-79,+51]mm · 7 of 36 slices shown, 9 images]
[im 5/36  brain]
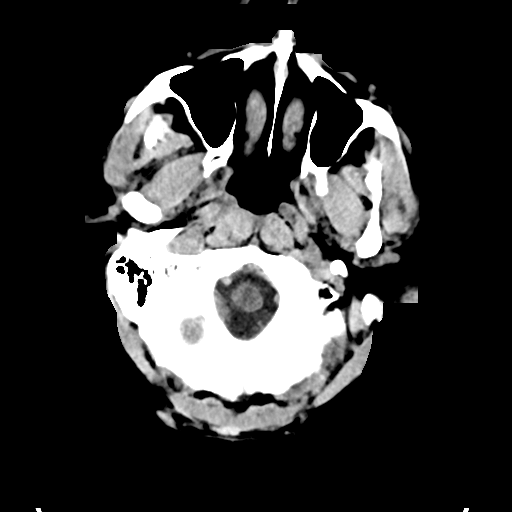
[im 5/36  bone]
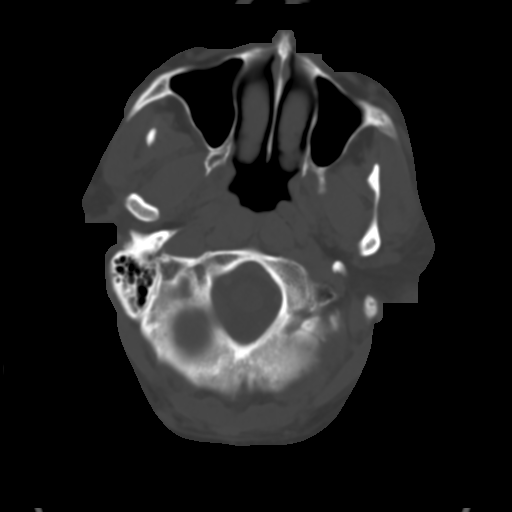
[im 9/36  brain]
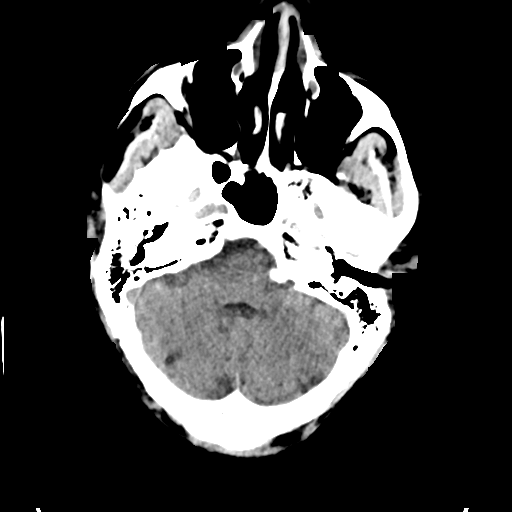
[im 14/36  brain]
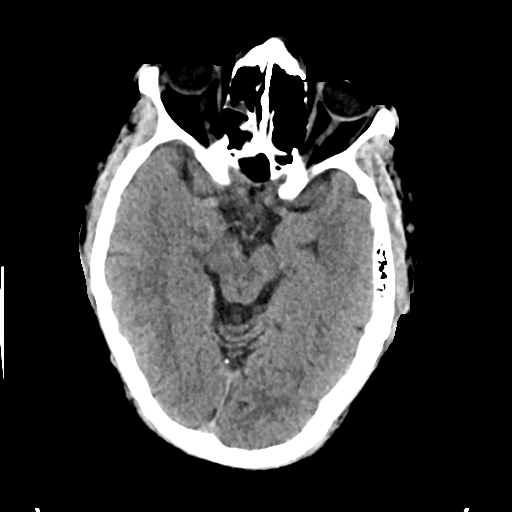
[im 18/36  brain]
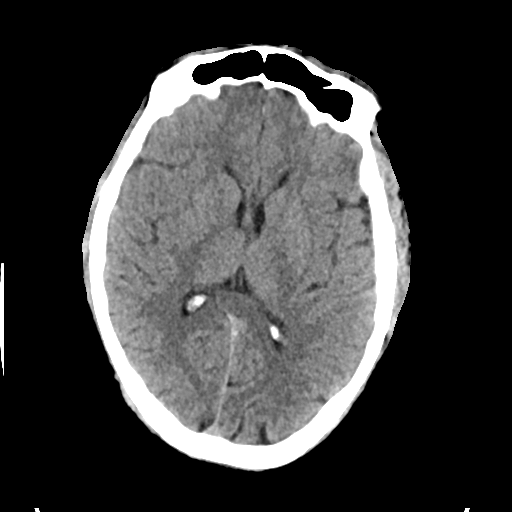
[im 22/36  brain]
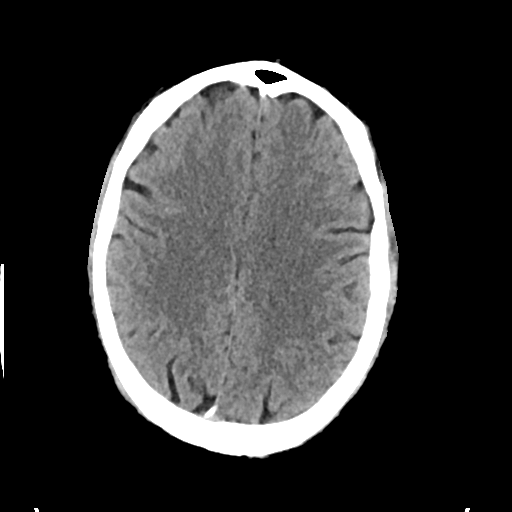
[im 22/36  bone]
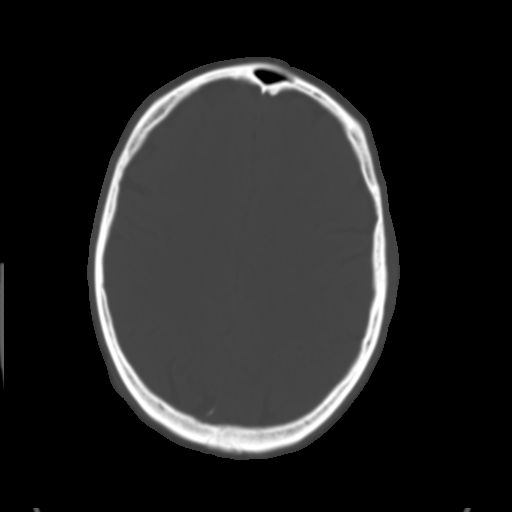
[im 27/36  brain]
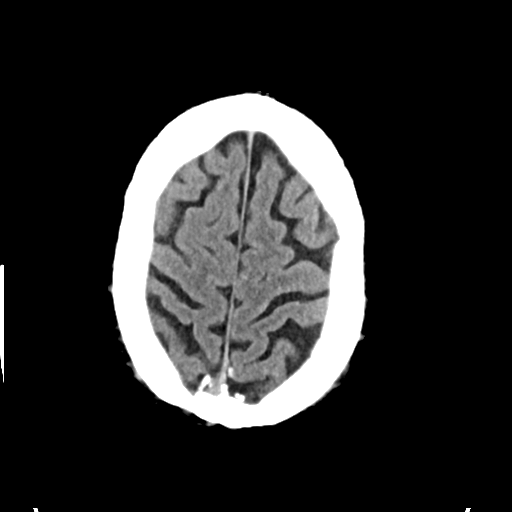
[im 31/36  brain]
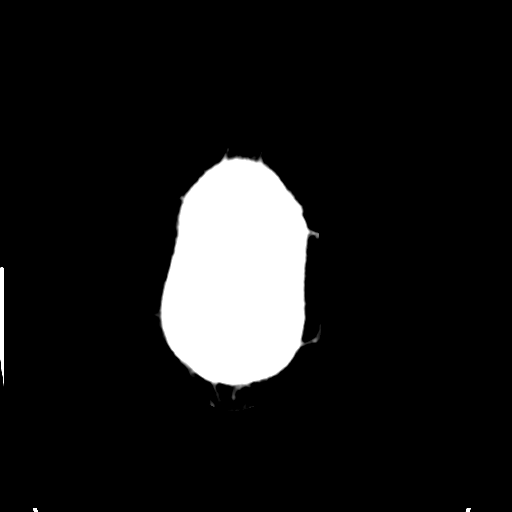

[Series 4: head bone · axial · 0.45mm/px · z∈[-83,-65]mm · 2 of 88 slices shown]
[im 9/88  bone]
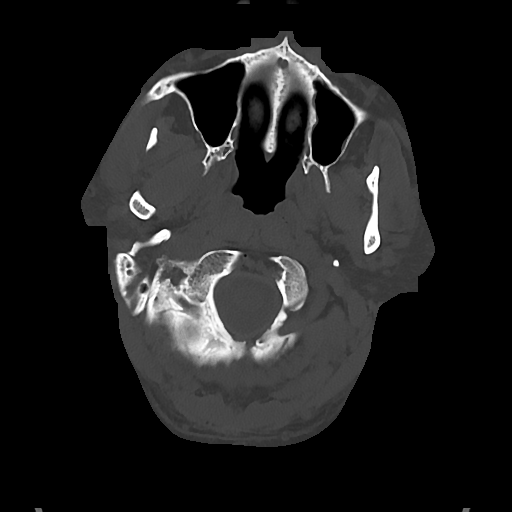
[im 18/88  bone]
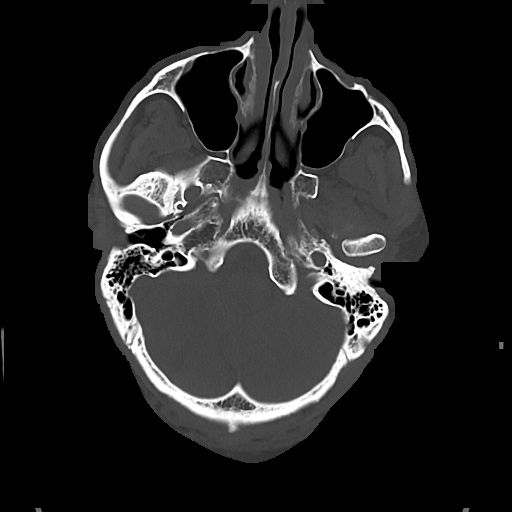

[Series 5: cor soft · coronal · 0.34mm/px · 3 of 74 slices shown]
[im 25/74  brain]
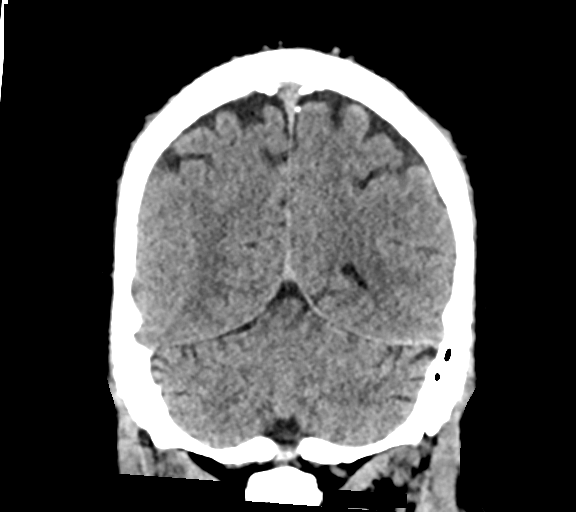
[im 33/74  brain]
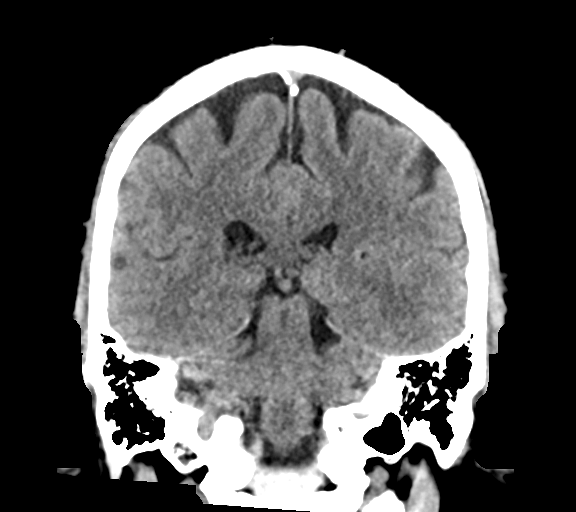
[im 41/74  brain]
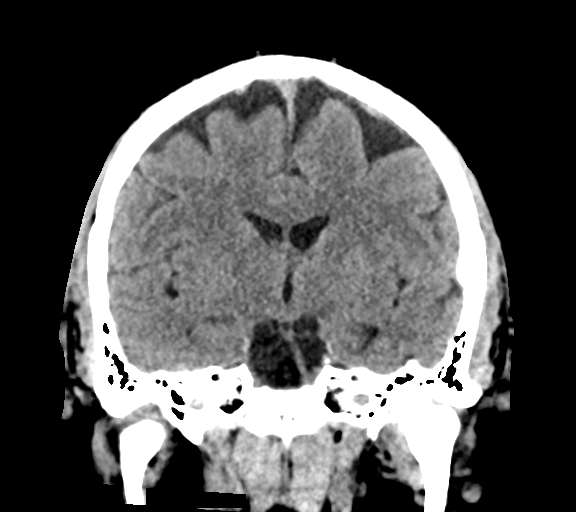

[Series 6: sag soft · sagittal · 0.34mm/px · 3 of 67 slices shown]
[im 23/67  brain]
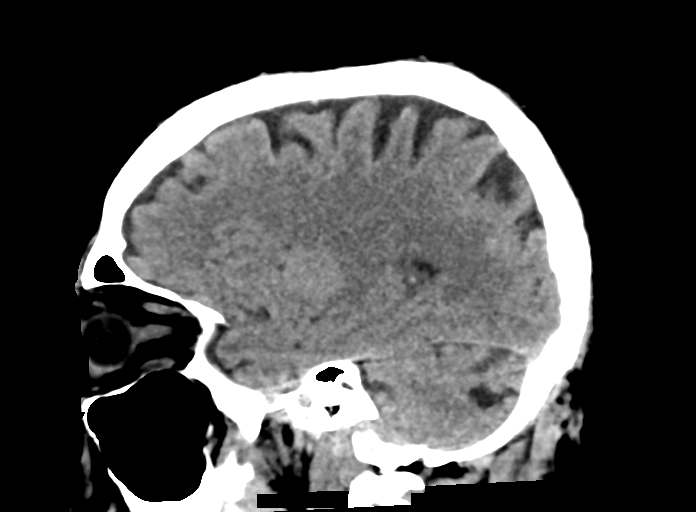
[im 34/67  brain]
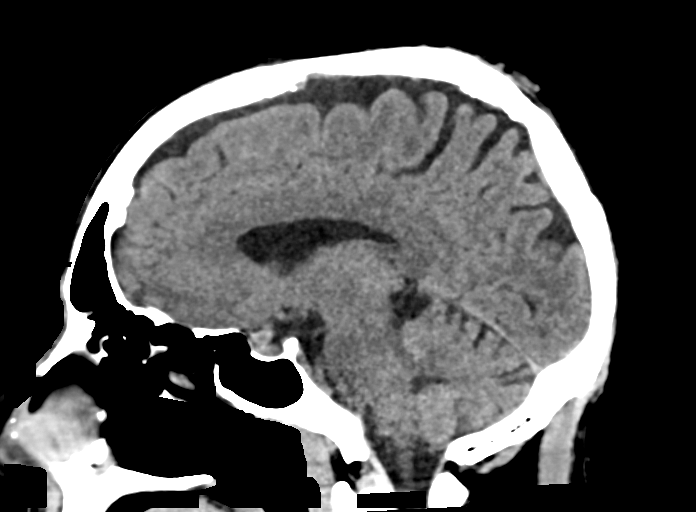
[im 45/67  brain]
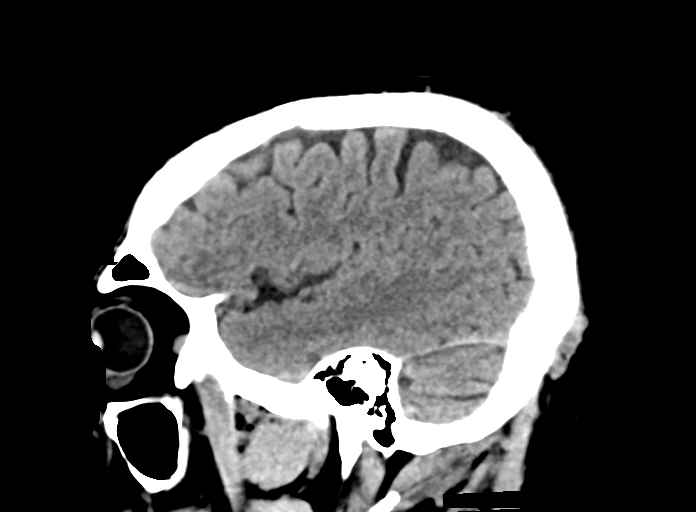

[15 of 47 positions shown; findings below may reference images not displayed]

FINDINGS: Brain: Ventricles are normal in size and configuration. There is no
demonstrable intracranial mass, hemorrhage, extra-axial fluid
collection, or midline shift. There are prior infarcts in the region
of the head of the caudate nucleus on the left and in the nearby
anterior superior left lentiform nucleus. There is evidence of a
prior infarct in the anterior superior right cerebellum. There is
also evidence of a prior infarct in the posterior mid right
cerebellum. A tiny lacunar infarct is noted in the mid left
cerebellum, chronic in appearance. No acute appearing infarct is
evident currently.

Vascular: There is no appreciable hyperdense vessel. There is
calcification in each carotid siphon region.

Skull: There are bony defects in each posterior parietal region.
Bony calvarium elsewhere appears intact.

Sinuses/Orbits: There is opacification in multiple ethmoid air
cells. Other paranasal sinuses are clear. Orbits appear symmetric
bilaterally.

Other: Mastoid air cells are clear.
IMPRESSION: Several supratentorial infratentorial prior appearing infarcts
noted. No acute infarct is demonstrable. There is no evident mass,
hemorrhage, or extra-axial fluid collection. Ventricles are located
in the midline.

There are foci of arterial vascular calcification. Symmetric
posterior right parietal bony defects are noted with bony calvarium
otherwise appearing intact. There is ethmoid sinus disease at
several sites.

## 2018-06-07 MED ORDER — WARFARIN SODIUM 5 MG PO TABS: 5.0000 mg | ORAL_TABLET | Freq: Every day | ORAL | Status: DC

## 2018-06-07 MED ORDER — OXYCODONE-ACETAMINOPHEN 10-325 MG PO TABS: 1.0000 | ORAL_TABLET | Freq: Four times a day (QID) | ORAL | 0 refills | Status: AC | PRN

## 2018-06-07 MED ORDER — GLIMEPIRIDE 2 MG PO TABS
4.0000 mg | ORAL_TABLET | Freq: Every day | ORAL | Status: DC
  Administered 2018-06-07 – 2018-06-08 (×2): 4 mg via ORAL
  Filled 2018-06-07 (×2): qty 2

## 2018-06-07 MED ORDER — HYDROXYZINE HCL 50 MG/ML IM SOLN
50.0000 mg | Freq: Four times a day (QID) | INTRAMUSCULAR | Status: DC | PRN
  Administered 2018-06-07: 50 mg via INTRAMUSCULAR
  Filled 2018-06-07: qty 1

## 2018-06-07 MED ORDER — WARFARIN SODIUM 2.5 MG PO TABS
2.5000 mg | ORAL_TABLET | Freq: Every day | ORAL | Status: DC
  Administered 2018-06-07: 2.5 mg via ORAL
  Filled 2018-06-07 (×2): qty 1

## 2018-06-07 MED ORDER — WARFARIN - PHYSICIAN DOSING INPATIENT
Freq: Every day | Status: DC
  Administered 2018-06-07: 17:00:00

## 2018-06-07 MED ORDER — ENOXAPARIN SODIUM 40 MG/0.4ML ~~LOC~~ SOLN
40.0000 mg | SUBCUTANEOUS | Status: DC
  Administered 2018-06-07: 40 mg via SUBCUTANEOUS
  Filled 2018-06-07 (×2): qty 0.4

## 2018-06-07 MED ORDER — ENOXAPARIN SODIUM 40 MG/0.4ML ~~LOC~~ SOLN: 40.0000 mg | SUBCUTANEOUS | 0 refills | Status: DC

## 2018-06-07 MED ORDER — METHOCARBAMOL 500 MG PO TABS: 500.0000 mg | ORAL_TABLET | Freq: Three times a day (TID) | ORAL | 0 refills | Status: AC | PRN

## 2018-06-07 MED ORDER — ONDANSETRON HCL 4 MG PO TABS: 4.0000 mg | ORAL_TABLET | Freq: Three times a day (TID) | ORAL | 0 refills | Status: DC | PRN

## 2018-06-07 MED FILL — Thrombin (Recombinant) For Soln 20000 Unit: CUTANEOUS | Qty: 1 | Status: AC

## 2018-06-07 NOTE — Evaluation (Signed)
Physical Therapy Evaluation Patient Details Name: [name] MRN: [id number] DOB: Sep 23, 1965 Today's Date: 06/07/2018   History of Present Illness  Pt is a 53 y/o male who presents s/p L3-L4 laminectomy/microdiscectomy on 06/06/2018. PMH significant for TIA, CVA, aortic valve replacement, DM, CHF, CAD, HTN, CABG  Clinical Impression  Pt admitted with above diagnosis. Pt currently with functional limitations due to the deficits listed below (see PT Problem List). At the time of PT eval pt was able to perform transfers and ambulation with gross supervision for safety and no AD. Pt was educated on precautions, car transfer, recommendations for positioning and activity progression. Pt complaining about headache at end of session and RN notified. Pt will benefit from skilled PT to increase their independence and safety with mobility to allow discharge to the venue listed below.       Follow Up Recommendations No PT follow up;Supervision - Intermittent    Equipment Recommendations  None recommended by PT    Recommendations for Other Services       Precautions / Restrictions Precautions Precautions: Fall;Back Precaution Booklet Issued: Yes (comment) Precaution Comments: Reviewed handout in detail. Pt will require reinforcement Required Braces or Orthoses: Spinal Brace Spinal Brace: Lumbar corset;Applied in sitting position Restrictions Weight Bearing Restrictions: No      Mobility  Bed Mobility Overal bed mobility: Needs Assistance Bed Mobility: Rolling;Sidelying to Sit Rolling: Supervision Sidelying to sit: Supervision       General bed mobility comments: VC's for proper log roll technique. Pt was able to perform with increased time, HOB flat and rails lowered to simulate home environment.   Transfers Overall transfer level: Needs assistance   Transfers: Sit to/from Stand Sit to Stand: Supervision         General transfer comment: VC's for hand placement on seated  surface for safety. No assist to power-up to full stand but supervision provided for safety.   Ambulation/Gait Ambulation/Gait assistance: Supervision Gait Distance (Feet): 300 Feet Assistive device: None Gait Pattern/deviations: Step-through pattern;Decreased stride length;Trunk flexed Gait velocity: Decreased Gait velocity interpretation: 1.31 - 2.62 ft/sec, indicative of limited community ambulator General Gait Details: VC's for improved posture. Pt ambulating slow but generally steady. No assist provided and no overt LOB noted, however supervision provided throughout for safety.   Stairs Stairs: Yes Stairs assistance: Min guard Stair Management: One rail Left;Step to pattern;Forwards Number of Stairs: 5 General stair comments: VC's for sequencing and general safety.   Wheelchair Mobility    Modified Rankin (Stroke Patients Only)       Balance Overall balance assessment: Needs assistance Sitting-balance support: Feet supported;No upper extremity supported Sitting balance-Leahy Scale: Fair     Standing balance support: No upper extremity supported Standing balance-Leahy Scale: Fair                               Pertinent Vitals/Pain Pain Assessment: Faces Faces Pain Scale: Hurts a little bit Pain Location: Incision site Pain Descriptors / Indicators: Operative site guarding Pain Intervention(s): Limited activity within patient's tolerance;Monitored during session;Repositioned    Home Living Family/patient expects to be discharged to:: Private residence Living Arrangements: Spouse/significant other Available Help at Discharge: Family;Available 24 hours/day(Initially, and then wife will be back to work) Type of Home: House Home Access: Stairs to enter Entrance Stairs-Rails: Right;Left;Can reach both Technical brewer of Steps: 5 Home Layout: One level Home Equipment: Hand held shower head      Prior Function Level of  Independence: Independent          Comments: Pt reports working out regularly prior to injury     Hand Dominance        Extremity/Trunk Assessment   Upper Extremity Assessment Upper Extremity Assessment: Defer to OT evaluation    Lower Extremity Assessment Lower Extremity Assessment: Generalized weakness;LLE deficits/detail LLE Deficits / Details: Decreased strength and muscular endurance consistent with pre-op diagnosis    Cervical / Trunk Assessment Cervical / Trunk Assessment: Other exceptions Cervical / Trunk Exceptions: s/p surgery  Communication   Communication: No difficulties  Cognition Arousal/Alertness: Awake/alert Behavior During Therapy: WFL for tasks assessed/performed Overall Cognitive Status: Within Functional Limits for tasks assessed                                        General Comments      Exercises     Assessment/Plan    PT Assessment Patient needs continued PT services  PT Problem List Decreased strength;Decreased activity tolerance;Decreased balance;Decreased mobility;Decreased knowledge of use of DME;Decreased knowledge of precautions;Decreased safety awareness;Pain       PT Treatment Interventions DME instruction;Gait training;Stair training;Functional mobility training;Therapeutic activities;Therapeutic exercise;Neuromuscular re-education;Patient/family education    PT Goals (Current goals can be found in the Care Plan section)  Acute Rehab PT Goals Patient Stated Goal: Get back to the gym PT Goal Formulation: With patient Time For Goal Achievement: 06/14/18 Potential to Achieve Goals: Good    Frequency Min 5X/week   Barriers to discharge        Co-evaluation               AM-PAC PT "6 Clicks" Mobility  Outcome Measure Help needed turning from your back to your side while in a flat bed without using bedrails?: None Help needed moving from lying on your back to sitting on the side of a flat bed without using bedrails?:  None Help needed moving to and from a bed to a chair (including a wheelchair)?: None Help needed standing up from a chair using your arms (e.g., wheelchair or bedside chair)?: None Help needed to walk in hospital room?: None Help needed climbing 3-5 steps with a railing? : A Little 6 Click Score: 23    End of Session Equipment Utilized During Treatment: Back brace Activity Tolerance: Patient tolerated treatment well Patient left: in chair;with call bell/phone within reach Nurse Communication: Mobility status PT Visit Diagnosis: Unsteadiness on feet (R26.81);Pain Pain - part of body: (back)    Time: 3662-9476 PT Time Calculation (min) (ACUTE ONLY): 25 min   Charges:   PT Evaluation $PT Eval Moderate Complexity: 1 Mod PT Treatments $Gait Training: 8-22 mins        Rolinda Roan, PT, DPT Acute Rehabilitation Services Pager: [phone] Office: [phone]   Thelma Comp 06/07/2018, 8:40 AM

## 2018-06-07 NOTE — Consult Note (Addendum)
Medical Consultation   [name]  GYI:[id number]  DOB: 1966-01-04  DOA: 06/06/2018  PCP: Biagio Borg, MD   Outpatient Specialists: Dr. Stanford Breed (cardiology)   Requesting physician: Dr. Rolena Infante (orthopedic)   Reason for consultation: Glycemic-control   History of Present Illness: [name] is an 53 y.o. male CAD status post CABG, ascending aortic dissection status post Bentall procedure and mechanical AVR, non-insulin dependent DM, hypertension, hypothyroidism, and right L3-4 HNP admitted to the hospital by orthopedic surgery today for L3-4 discectomy. He is immediately status-post L3-4 discectomy, tolerated well and sent to PACU in stable condition. He uses metformin and glimepiride for DM, reports intolerance to insulin glargine, is not willing to try another formulation, and hospitalists are asked to assist with glycemic-control while in hospital.   Patient interviewed and examined shortly after surgery; he reports "I can feel my leg now and the back feels much better."    06/07/2018: Patient seen and examined at his bedside.  Reports dizziness while sitting on a chair. No headache.  Blood sugar is elevated we will increase his glimepiride to 4 mg daily.   Review of Systems:  ROS As per HPI otherwise 10 point review of systems negative.    Past Medical History: Past Medical History:  Diagnosis Date  . Ascending aortic dissection ([place])    a. 2000 - with aortic valve involvement. S/p repair of aortic dissection with placement of mechanical AVR.  [name] CAD (coronary artery disease)    a. 2000 VG->PDA @ time of Ao dissection repair;  b. Cardiac CT 06/2010: no CAD, only mild plaque in prox RCA;  c. 2015 Cath: LM nl, LAD nl, LCX nl, RCA 100, VG->PDA 90 (4.5x12 Rebel BMS). d. 10/23/17 instent restenosis SVG to RCA, DES placed.  . Cholelithiasis 08/22/2013  . Chronic diastolic CHF (congestive heart failure) ([place])    a. 03/2016 Echo: EF 55-60%, no rwma, Gr2 DD.    . Diabetes mellitus   . Dyspnea   . Gross hematuria   . H/O mechanical aortic valve replacement    a. 03/2016 Echo: AoV mean gradient 67mmHg, Valve Area (VTI) 1.36 cm^2, (Vmax) 1.22 cm^2, mod dil LA.  [name] Hyperlipidemia   . Hypertension   . NEPHROLITHIASIS, HX OF   . Obesity   . PAF (paroxysmal atrial fibrillation) ([place])    a. H/o such, with recurrence of coarse afib/flutter during ER consult 03/2012.  . Stroke ([place])   . TRANSIENT ISCHEMIC ATTACK, HX OF    a. In 2004.  [name] Unspecified vitamin D deficiency     Past Surgical History: Past Surgical History:  Procedure Laterality Date  . AORTIC VALVE REPLACEMENT  2000  . APPENDECTOMY  1998  . CORONARY ARTERY BYPASS GRAFT  2000  . CORONARY STENT INTERVENTION N/A 10/23/2017   Procedure: CORONARY STENT INTERVENTION;  Surgeon: [name], MD;  Location: [facility];  Service: Cardiovascular;  Laterality: N/A;  . LEFT AND RIGHT HEART CATHETERIZATION WITH CORONARY ANGIOGRAM N/A 07/19/2013   Procedure: LEFT AND RIGHT HEART CATHETERIZATION WITH CORONARY ANGIOGRAM;  Surgeon: [name], MD;  Location: [facility] CATH LAB;  Service: Cardiovascular;  Laterality: N/A;  . RIGHT HEART CATH AND CORONARY/GRAFT ANGIOGRAPHY N/A 10/23/2017   Procedure: RIGHT HEART CATH AND CORONARY/GRAFT ANGIOGRAPHY;  Surgeon: [name], MD;  Location: [facility];  Service: Cardiovascular;  Laterality: N/A;  . TEE WITHOUT CARDIOVERSION N/A 07/22/2013  Procedure: TRANSESOPHAGEAL ECHOCARDIOGRAM (TEE);  Surgeon: [name], MD;  Location: [facility] ENDOSCOPY;  Service: Cardiovascular;  Laterality: N/A;     Allergies:   Allergies  Allergen Reactions  . Diltiazem Hives  . Insulin Glargine Swelling    edema     Social History:  reports that he has never smoked. He has never used smokeless tobacco. He reports that he does not drink alcohol or use drugs.   Family History: Family History  Problem Relation Age of Onset  . Coronary artery  disease Mother   . Hypertension Mother   . Diabetes Brother   . Coronary artery disease Other 69       male 1st degree relative, CABG in his 41's (father)     Physical Exam: Vitals:   06/06/18 2050 06/06/18 2250 06/07/18 0315 06/07/18 0728  BP: 134/87 120/73 (!) 153/92 112/80  Pulse: 81 88 78 81  Resp: 20 20 20    Temp: (!) 97.4 F (36.3 C) 97.8 F (36.6 C) 97.7 F (36.5 C) 97.7 F (36.5 C)  TempSrc: Oral Oral Oral Oral  SpO2: 92% 98% 95% 94%  Weight:      Height:        Constitutional: Well-developed well-nourished in no acute distress.  Alert and oriented x3.   Eyes: PERLA, EOMI, irises appear normal, anicteric sclera,  ENMT: external ears and nose appear normal, normal hearing           Lips appears normal, oropharynx mucosa, tongue, posterior pharynx appear normal  Neck: neck appears normal, no masses, normal ROM, no thyromegaly, no JVD  CVS: S1-S2 clear, no murmur rubs or gallops, no LE edema, normal pedal pulses  Respiratory:  clear to auscultation bilaterally, no wheezing, rales or rhonchi. Respiratory effort normal. No accessory muscle use.  Musculoskeletal: Trace edema noted in lower extremities bilaterally Neuro: Cranial nerves II-XII intact, strength, sensation, reflexes Psych: judgement and insight appear normal, stable mood and affect, mental status  Data reviewed:  I have personally reviewed following labs and imaging studies Labs:  CBC: Recent Labs  Lab 05/31/18 1354  WBC 5.6  HGB 13.8  HCT 40.1  MCV 81.5  PLT 564    Basic Metabolic Panel: Recent Labs  Lab 05/31/18 1354  NA 138  K 3.3*  CL 103  CO2 26  GLUCOSE 139*  BUN 9  CREATININE 0.95  CALCIUM 8.8*   GFR Estimated Creatinine Clearance: 125.7 mL/min (by C-G formula based on SCr of 0.95 mg/dL). Liver Function Tests: No results for input(s): AST, ALT, ALKPHOS, BILITOT, PROT, ALBUMIN in the last 168 hours. No results for input(s): LIPASE, AMYLASE in the last 168 hours. No results for  input(s): AMMONIA in the last 168 hours. Coagulation profile Recent Labs  Lab 06/06/18 1423  INR 1.08    Cardiac Enzymes: No results for input(s): CKTOTAL, CKMB, CKMBINDEX, TROPONINI in the last 168 hours. BNP: Invalid input(s): POCBNP CBG: Recent Labs  Lab 06/06/18 1340 06/06/18 1535 06/06/18 1921 06/06/18 2247 06/07/18 0624  GLUCAP 118* 104* 130* 230* 215*   D-Dimer No results for input(s): DDIMER in the last 72 hours. Hgb A1c No results for input(s): HGBA1C in the last 72 hours. Lipid Profile No results for input(s): CHOL, HDL, LDLCALC, TRIG, CHOLHDL, LDLDIRECT in the last 72 hours. Thyroid function studies No results for input(s): TSH, T4TOTAL, T3FREE, THYROIDAB in the last 72 hours.  Invalid input(s): FREET3 Anemia work up No results for input(s): VITAMINB12, FOLATE, FERRITIN, TIBC, IRON, RETICCTPCT in the last 28  hours. Urinalysis    Component Value Date/Time   COLORURINE YELLOW 08/31/2017 0906   APPEARANCEUR CLEAR 08/31/2017 0906   LABSPEC <=1.005 (A) 08/31/2017 0906   PHURINE 6.0 08/31/2017 0906   GLUCOSEU 100 (A) 08/31/2017 0906   HGBUR NEGATIVE 08/31/2017 0906   BILIRUBINUR NEGATIVE 08/31/2017 0906   KETONESUR NEGATIVE 08/31/2017 0906   PROTEINUR 30 (A) 12/31/2009 1606   UROBILINOGEN 1.0 08/31/2017 0906   NITRITE NEGATIVE 08/31/2017 0906   LEUKOCYTESUR NEGATIVE 08/31/2017 0906     Microbiology Recent Results (from the past 240 hour(s))  Surgical pcr screen     Status: Abnormal   Collection Time: 05/31/18  1:54 PM  Result Value Ref Range Status   MRSA, PCR NEGATIVE NEGATIVE Final   Staphylococcus aureus POSITIVE (A) NEGATIVE Final    Comment: (NOTE) The Xpert SA Assay (FDA approved for NASAL specimens in patients 67 years of age and older), is one component of a comprehensive surveillance program. It is not intended to diagnose infection nor to guide or monitor treatment. Performed at [facility], [place] [address], [place],  [place] [address]        Inpatient Medications:   Scheduled Meds: . atorvastatin  40 mg Oral q1800  . furosemide  40 mg Oral BID  . glimepiride  4 mg Oral Q breakfast  . levothyroxine  50 mcg Oral Daily  . losartan  100 mg Oral Daily  . metFORMIN  1,000 mg Oral BID WC  . metoprolol  200 mg Oral Daily  . potassium chloride  20 mEq Oral Daily  . sodium chloride flush  3 mL Intravenous Q12H   Continuous Infusions: . sodium chloride    . lactated ringers 10 mL/hr at 06/06/18 1419  . lactated ringers    . methocarbamol (ROBAXIN) IV       Radiological Exams on Admission: Dg Lumbar Spine 2-3 Views  Result Date: 06/06/2018 CLINICAL DATA:  L3-4 microdiscectomy. EXAM: DG C-ARM 61-120 MIN; LUMBAR SPINE - 2-3 VIEW COMPARISON:  MRI lumbar spine 04/24/2018 FINDINGS: Intraoperative fluoroscopy is utilized for surgical control purposes. Fluoroscopy time is recorded at 8 seconds. A single spot fluoroscopic images obtained. Spot fluoroscopic image obtained demonstrates a radiopaque marker placed over the posterior elements at the level of the L4 pedicle. Superficial sponge marker is present. A retractor is seen superficially. Note that there is a somewhat transitional lumbosacral vertebra. Using the same segmental numbering convention as on the prior MRI, the last well-formed intervertebral disc is reported as L5-S1 and labeled as such on the image. IMPRESSION: Intraoperative fluoroscopy utilized for surgical control purposes. Electronically Signed   By: Lucienne Capers M.D.   On: 06/06/2018 19:27   Dg C-arm 1-60 Min  Result Date: 06/06/2018 CLINICAL DATA:  L3-4 microdiscectomy. EXAM: DG C-ARM 61-120 MIN; LUMBAR SPINE - 2-3 VIEW COMPARISON:  MRI lumbar spine 04/24/2018 FINDINGS: Intraoperative fluoroscopy is utilized for surgical control purposes. Fluoroscopy time is recorded at 8 seconds. A single spot fluoroscopic images obtained. Spot fluoroscopic image obtained demonstrates a radiopaque marker placed  over the posterior elements at the level of the L4 pedicle. Superficial sponge marker is present. A retractor is seen superficially. Note that there is a somewhat transitional lumbosacral vertebra. Using the same segmental numbering convention as on the prior MRI, the last well-formed intervertebral disc is reported as L5-S1 and labeled as such on the image. IMPRESSION: Intraoperative fluoroscopy utilized for surgical control purposes. Electronically Signed   By: Lucienne Capers M.D.   On: 06/06/2018  19:27    Impression/Recommendations Principal Problem:   Lumbar disc herniation Active Problems:   Diabetes mellitus type II, non insulin dependent (HCC)   Chronic diastolic CHF (congestive heart failure) (HCC)   Coronary artery disease involving native heart without angina pectoris  Type 2 diabetes with hyperglycemia Last A1c done in October 2019 revealed A1c 6.2 Uncontrolled diabetes Increase glimepiride to 4 mg daily Continue metformin 1000 mg twice daily Continue regular CBGs Will obtain A1c and BMP this morning  Dizziness May consider orthostatic vital signs if persistent Currently on his home dose Lasix po 40 mg twice daily Net -1.5 L since admission Fall precautions Will obtain CBC this morning  Hypokalemia Potassium 3.3 on 05/31/2018 Repeat BMP this morning  Lumbar disc herniation  - Status-post L3-4 discectomy 06/06/18  - Patient reports RLE numbness has resolved following surgery, is neurovascularly intact, has a drain with scant serosanguinous output  - Continue post-op care per ortho   CAD  - Status-post CABG and more recent stenting  - Denies any anginal sxs since his stent was placed in June '19  - Continue antiplatelet, statin, beta-blocker, ARB   PAF  - In a regular rhythm  - CHADS-VASc 4 (CAD, DM, HTN, CHF)  - Coumadin held with plan to resume with Lovenox   HTN  - BP at goal  - Continue Toprol, Norvasc, losartan    Hypothyroidism  - Continue Synthroid    Chronic diastolic CHF  - Appears compensated   - Documented wt is ~20 lbs lower than in October, ?accuracy  - Continue Lasix, ARB, beta-blocker, daily wt       Thank you for this consultation.  Our [facility] hospitalist team will follow the patient with you.   Time Spent: 35 mins  Kayleen Memos M.D. [facility] 06/07/2018, 9:49 AM

## 2018-06-07 NOTE — Progress Notes (Signed)
Subjective: Procedure(s) (LRB): Right L3-4 disectomy (Right) 1 Day Post-Op  Patient reports pain as 3 on 0-10 scale.  Reports none leg pain reports incisional back pain   Positive void Negative bowel movement Positive flatus Negative chest pain or shortness of breath Complains of nausea and dizziness. No headache.  Objective: Vital signs in last 24 hours:  Temp:  [97.4 F (36.3 C)-98.2 F (36.8 C)] 97.7 F (36.5 C) (01/16 0728) Pulse Rate:  [78-88] 81 (01/16 0728) Resp:  [15-21] 20 (01/16 0315) BP: (112-153)/(73-93) 112/80 (01/16 0728) SpO2:  [88 %-100 %] 94 % (01/16 0728) Weight:  [123.8 kg] 123.8 kg (01/15 1337) asdLabs: No results for input(s): WBC, RBC, HCT, PLT in the last 72 hours.   No results for input(s): NA, K, CL, CO2, BUN, CREATININE, GLUCOSE, CALCIUM in the last 72 hours. Recent Labs    06/06/18 1423  INR 1.08    Physical Exam: Neurologically intact Intact pulses distally Incision: dressing C/D/I Compartment soft No headache. Cranial nerves II through XII are intact. Pupils equal round and reactive to light and accommodation. Gait pattern is normal. Negative Babinski test, negative Hoffmann test. Body mass index is 35.05 kg/m.   CLINICAL DATA:  Headache after questionable trauma during surgical procedure  EXAM: CT HEAD WITHOUT CONTRAST  TECHNIQUE: Contiguous axial images were obtained from the base of the skull through the vertex without intravenous contrast.  COMPARISON:  June 05, 2014  FINDINGS: Brain: Ventricles are normal in size and configuration. There is no demonstrable intracranial mass, hemorrhage, extra-axial fluid collection, or midline shift. There are prior infarcts in the region of the head of the caudate nucleus on the left and in the nearby anterior superior left lentiform nucleus. There is evidence of a prior infarct in the anterior superior right cerebellum. There is also evidence of a prior infarct in the  posterior mid right cerebellum. A tiny lacunar infarct is noted in the mid left cerebellum, chronic in appearance. No acute appearing infarct is evident currently.  Vascular: There is no appreciable hyperdense vessel. There is calcification in each carotid siphon region.  Skull: There are bony defects in each posterior parietal region. Bony calvarium elsewhere appears intact.  Sinuses/Orbits: There is opacification in multiple ethmoid air cells. Other paranasal sinuses are clear. Orbits appear symmetric bilaterally.  Other: Mastoid air cells are clear.  IMPRESSION: Several supratentorial infratentorial prior appearing infarcts noted. No acute infarct is demonstrable. There is no evident mass, hemorrhage, or extra-axial fluid collection. Ventricles are located in the midline.  There are foci of arterial vascular calcification. Symmetric posterior right parietal bony defects are noted with bony calvarium otherwise appearing intact. There is ethmoid sinus disease at several sites.   Electronically Signed   By: Lowella Grip III M.D.   On: 06/07/2018 10:14    Assessment/Plan: Patient stable  xrays n/a Continue mobilization with physical therapy Continue care  Advance diet Up with therapy   1. At this point I'm there is no evidence of acute injury on his head CT. Recommend treating the nausea with medications and observation.  2. I spoke with Dr. Stanford Breed his cardiologist he recommended the following plan: Lovenox with bridge to Coumadin. Okay to restart Plavix next week. As a result we will plan on starting this evening (24 hours post surgery) as well as Coumadin. Patient will follow-up as his Coumadin clinic on Monday for INR check and most likely restart Plavix.  3. Even though the drain output has been minimal we will  keep the drain in today since we are restarting his Lovenox this evening.  4. Continue recommending mobilization and physical/occupational  therapy.  5. Plan on discharge to home tomorrow as long as he is stable.  Melina Schools, MD [facility] [phone]

## 2018-06-07 NOTE — Addendum Note (Signed)
Addendum  created 06/07/18 1404 by Jearld Pies, CRNA   Intraprocedure Event edited

## 2018-06-07 NOTE — Evaluation (Signed)
Occupational Therapy Evaluation Patient Details Name: [name] MRN: [id number] DOB: [birth year] Today's Date: 06/07/2018    History of Present Illness Pt is a 53 y/o male who presents s/p L3-L4 laminectomy/microdiscectomy on 06/06/2018. PMH significant for TIA, CVA, aortic valve replacement, DM, CHF, CAD, HTN, CABG   Clinical Impression   This 53 y/o male presents with the above. At baseline pt is independent with ADL and functional mobility. Session slightly limited as pt with reports of headache during session and with episode of n/v prior to start of session. Transport arriving end of session to take pt for CT scan. Pt demonstrating short distance functional mobility within room without AD and overall close minguard assist. He currently requires minA for UB and LB ADL. Initiated education regarding back precautions, safety and compensatory strategies for completing ADL while maintaining precautions. Pt demonstrating carryover, though does require intermittent cues to maintain precautions with functional tasks. He will benefit from continued OT services while in acute setting to maximize his overall safety and independence with ADL and mobility prior to return home. Will follow.     Follow Up Recommendations  No OT follow up;Supervision/Assistance - 24 hour(24hr initially)    Equipment Recommendations  3 in 1 bedside commode           Precautions / Restrictions Precautions Precautions: Fall;Back Precaution Booklet Issued: Yes (comment) Precaution Comments: Reviewed handout in detail. Pt will require reinforcement Required Braces or Orthoses: Spinal Brace Spinal Brace: Lumbar corset;Applied in sitting position Restrictions Weight Bearing Restrictions: No      Mobility Bed Mobility Overal bed mobility: Needs Assistance Bed Mobility: Rolling;Sit to Sidelying Rolling: Supervision Sidelying to sit: Supervision     Sit to sidelying: Min guard General bed mobility comments:  VC's for proper log roll technique. Pt was able to perform with increased time and effort  Transfers Overall transfer level: Needs assistance   Transfers: Sit to/from Stand Sit to Stand: Min guard         General transfer comment: VC's for hand placement on seated surface for safety. No assist to power-up to full stand but minguard provided for safety. pt slow and cautious during transitions    Balance Overall balance assessment: Needs assistance Sitting-balance support: Feet supported;No upper extremity supported Sitting balance-Leahy Scale: Fair     Standing balance support: No upper extremity supported Standing balance-Leahy Scale: Fair                             ADL either performed or assessed with clinical judgement   ADL Overall ADL's : Needs assistance/impaired Eating/Feeding: Modified independent;Sitting   Grooming: Set up;Sitting;Wash/dry face   Upper Body Bathing: Min guard;Sitting   Lower Body Bathing: Min guard;Sit to/from stand   Upper Body Dressing : Minimal assistance;Sitting Upper Body Dressing Details (indicate cue type and reason): for gown management and brace management Lower Body Dressing: Min guard;Minimal assistance;Sit to/from stand Lower Body Dressing Details (indicate cue type and reason): light minA standing balance, pt donning underwear overall with good carryover of back precautions Toilet Transfer: Min guard;Minimal assistance;Stand-pivot;BSC Toilet Transfer Details (indicate cue type and reason): simulated via transfer recliner to [place] and Hygiene: Minimal assistance;Sit to/from stand       Functional mobility during ADLs: Min guard;Minimal assistance General ADL Comments: pt limited this session due to nausea/episode of vommiting prior to session start. pt appeared to feel a little better during session though does endorse continued headache,  and then with drain leaking during session (RN  notified); assisted with changing into new gown, donning LB clothing and assisted back to bed; requires intermittent cues for maintaining back precautions, will need continued review and practice of compensatory strategies for performing ADL and functional transfer; session also limited as transport arriving to take pt to CT      Vision         Perception     Praxis      Pertinent Vitals/Pain Pain Assessment: 0-10 Pain Score: 8  Faces Pain Scale: Hurts a little bit Pain Location: head > incision site at this time Pain Descriptors / Indicators: Headache Pain Intervention(s): Limited activity within patient's tolerance;Monitored during session;Repositioned     Hand Dominance     Extremity/Trunk Assessment Upper Extremity Assessment Upper Extremity Assessment: Overall WFL for tasks assessed   Lower Extremity Assessment Lower Extremity Assessment: Defer to PT evaluation LLE Deficits / Details: Decreased strength and muscular endurance consistent with pre-op diagnosis   Cervical / Trunk Assessment Cervical / Trunk Assessment: Other exceptions Cervical / Trunk Exceptions: s/p surgery   Communication Communication Communication: No difficulties   Cognition Arousal/Alertness: Awake/alert Behavior During Therapy: WFL for tasks assessed/performed Overall Cognitive Status: Within Functional Limits for tasks assessed                                     General Comments       Exercises     Shoulder Instructions      Home Living Family/patient expects to be discharged to:: Private residence Living Arrangements: Spouse/significant other Available Help at Discharge: Family;Available 24 hours/day(Initially, and then wife will be back to work) Type of Home: House Home Access: Stairs to enter Technical brewer of Steps: 5 Entrance Stairs-Rails: Right;Left;Can reach both Independence: One level     Bathroom Shower/Tub: Teacher, early years/pre:  Handicapped height     Home Equipment: Hand held shower head          Prior Functioning/Environment Level of Independence: Independent        Comments: Pt reports working out regularly prior to injury        OT Problem List: Decreased strength;Decreased range of motion;Decreased activity tolerance;Impaired balance (sitting and/or standing);Decreased knowledge of precautions;Pain;Obesity      OT Treatment/Interventions: Self-care/ADL training;Therapeutic exercise;DME and/or AE instruction;Therapeutic activities;Patient/family education;Balance training    OT Goals(Current goals can be found in the care plan section) Acute Rehab OT Goals Patient Stated Goal: Get back to the gym OT Goal Formulation: With patient Time For Goal Achievement: 06/21/18 Potential to Achieve Goals: Good  OT Frequency: Min 2X/week   Barriers to D/C:            Co-evaluation              AM-PAC OT "6 Clicks" Daily Activity     Outcome Measure Help from another person eating meals?: None Help from another person taking care of personal grooming?: A Little Help from another person toileting, which includes using toliet, bedpan, or urinal?: A Little Help from another person bathing (including washing, rinsing, drying)?: A Little Help from another person to put on and taking off regular upper body clothing?: A Little Help from another person to put on and taking off regular lower body clothing?: A Little 6 Click Score: 19   End of Session Equipment Utilized During Treatment: Back brace Nurse Communication: Mobility  status  Activity Tolerance: Patient tolerated treatment well;Other (comment)(limited due to nausea) Patient left: in bed;with call bell/phone within reach;with nursing/sitter in room  OT Visit Diagnosis: Other abnormalities of gait and mobility (R26.89);Pain Pain - part of body: (head and back)                Time: 1410-3013 OT Time Calculation (min): 27 min Charges:  OT  General Charges $OT Visit: 1 Visit OT Evaluation $OT Eval Low Complexity: 1 Low OT Treatments $Self Care/Home Management : 8-22 mins  Lou Cal, OT [facility] Pager [phone] Office [place] 06/07/2018, 10:27 AM

## 2018-06-07 NOTE — Progress Notes (Signed)
Orthopedic Tech Progress Note Patient Details:  [name] 20-Apr-1966 [id number] Nurse said he has brace  Patient ID: [name], male   DOB: 1966/03/28, 53 y.o.   MRN: [id number]   [name] 06/07/2018, 8:49 AM

## 2018-06-07 NOTE — Telephone Encounter (Signed)
Per conversation with dr books and dr Stanford Breed, patient will continue on warfarin and lovenox until INR therapeutic. The plavix will be held for a couple days and then restarted. Patient to have INR check Monday next week.

## 2018-06-08 DIAGNOSIS — E7800 Pure hypercholesterolemia, unspecified: Secondary | ICD-10-CM | POA: Diagnosis not present

## 2018-06-08 DIAGNOSIS — M5116 Intervertebral disc disorders with radiculopathy, lumbar region: Secondary | ICD-10-CM | POA: Diagnosis not present

## 2018-06-08 DIAGNOSIS — R51 Headache: Secondary | ICD-10-CM | POA: Diagnosis not present

## 2018-06-08 DIAGNOSIS — I5032 Chronic diastolic (congestive) heart failure: Secondary | ICD-10-CM | POA: Diagnosis not present

## 2018-06-08 DIAGNOSIS — M5126 Other intervertebral disc displacement, lumbar region: Secondary | ICD-10-CM | POA: Diagnosis not present

## 2018-06-08 DIAGNOSIS — I480 Paroxysmal atrial fibrillation: Secondary | ICD-10-CM | POA: Diagnosis not present

## 2018-06-08 DIAGNOSIS — I110 Hypertensive heart disease with heart failure: Secondary | ICD-10-CM | POA: Diagnosis not present

## 2018-06-08 DIAGNOSIS — M4316 Spondylolisthesis, lumbar region: Secondary | ICD-10-CM | POA: Diagnosis not present

## 2018-06-08 DIAGNOSIS — I2510 Atherosclerotic heart disease of native coronary artery without angina pectoris: Secondary | ICD-10-CM | POA: Diagnosis not present

## 2018-06-08 DIAGNOSIS — E559 Vitamin D deficiency, unspecified: Secondary | ICD-10-CM | POA: Diagnosis not present

## 2018-06-08 LAB — GLUCOSE, CAPILLARY
Glucose-Capillary: 172 mg/dL — ABNORMAL HIGH (ref 70–99)
Glucose-Capillary: 120 mg/dL — ABNORMAL HIGH (ref 70–99)

## 2018-06-08 MED ORDER — MAGNESIUM CITRATE PO SOLN
1.0000 | Freq: Once | ORAL | Status: AC
  Administered 2018-06-08: 1 via ORAL
  Filled 2018-06-08: qty 296

## 2018-06-08 NOTE — Progress Notes (Signed)
    Subjective: Procedure(s) (LRB): Right L3-4 disectomy (Right) 2 Days Post-Op  Patient reports pain as 3 on 0-10 scale.  Reports decreased leg pain reports incisional back pain   Positive void Negative bowel movement Positive flatus Negative chest pain or shortness of breath  Objective: Vital signs in last 24 hours: Temp:  [97.7 F (36.5 C)-98.7 F (37.1 C)] 98.5 F (36.9 C) (01/17 0514) Pulse Rate:  [70-81] 70 (01/17 0514) Resp:  [16-20] 20 (01/17 0514) BP: (99-112)/(63-80) 105/74 (01/17 0514) SpO2:  [94 %-100 %] 100 % (01/17 0514)  Intake/Output from previous day: 01/16 0701 - 01/17 0700 In: 708.4 [I.V.:708.4] Out: 1525 [Emesis/NG output:1500; Drains:25]  Labs: Recent Labs    06/07/18 1022  WBC 8.7  RBC 4.01*  HCT 32.7*  PLT 166   Recent Labs    06/07/18 1022  NA 138  K 4.6  CL 104  CO2 26  BUN 12  CREATININE 1.19  GLUCOSE 185*  CALCIUM 8.7*   Recent Labs    06/06/18 1423  INR 1.08    Physical Exam: Neurologically intact ABD soft Intact pulses distally Incision: dressing C/D/I Compartment soft Body mass index is 35.05 kg/m. Drain - minimal  Assessment/Plan: Patient stable  xrays n/a Continue mobilization with physical therapy Continue care  Advance diet Up with therapy  1. Plan on d/c to home today 2. Will continue lovenox and coumadin (2.5mg ). 3. Patient will go to coumadin clinic on Monday for labs and adjustment of coumadin dose if needed.  Will restart plavix on Monday. 4. Script for percocet/robaxin/lovenox/and zofran provided.  Patient has 5mg  Coumadin at home. 5. f/u with me in 2 weeks. 6. Patient knows to contact me immediately if he is incontinent of bowel/bladder or bleeding at incision site.  Melina Schools, MD [facility] [phone]

## 2018-06-08 NOTE — Progress Notes (Signed)
Patient is discharged from room 3C05 at this time. Alert and in stable condition. IV site d/c'd and instructions read to patient and daughter with understanding verbalized. Left unit via wheelchair with all belongings at side. 

## 2018-06-08 NOTE — Discharge Instructions (Signed)
Surgical Spinal Decompression, Care After Refer to this sheet in the next few weeks. These instructions provide you with information about caring for yourself after your procedure. Your health care provider may also give you more specific instructions. Your treatment has been planned according to current medical practices, but problems sometimes occur. Call your health care provider if you have any problems or questions after your procedure. What can I expect after the procedure? It is common to have pain for the first few days after the procedure. Some people continue to have mild pain even after making a full recovery. Follow these instructions at home: Medicine  Take medicines only as directed by your health care provider.  Avoid taking over-the-counter pain medicines unless your health care provider tells you otherwise. These medicines interfere with the development and growth of new bone cells.  If you were prescribed a narcotic pain medicine, take it exactly as told by your health care provider. ? Do not drink alcohol while on the medicine. ? Do not drive while on the medicine. Injury care  Care for your back brace as told by your health care provider.  If directed, apply ice to the injured area: ? Put ice in a plastic bag. ? Place a towel between your skin and the bag. ? Leave the ice on for 20 minutes, 2-3 times a day. Activity  Perform physical therapy exercises as told by your health care provider.  Exercise regularly. Start by taking short walks. Slowly increase your activity level over time. Gentle exercise helps to ease pain.  Sit, stand, walk, turn in bed, and reposition yourself as told by your health care provider. This will help to keep your spine in proper alignment.  Avoid bending and twisting your body.  Avoid doing strenuous household chores, such as vacuuming.  Do not lift anything that is heavier than 10 lb (4.5 kg). Other Instructions  Keep all follow-up  visits as directed by your health care provider. This is important.  Do not use any tobacco products, including cigarettes, chewing tobacco, or electronic cigarettes. If you need help quitting, ask your health care provider. Nicotine affects the way bones heal. Contact a health care provider if:  Your pain gets worse.  You have a fever.  You have redness, swelling, or pain at the site of your incision.  You have fluid, blood, or pus coming from your incision.  You have numbness, tingling, or weakness in any part of your body. Get help right away if:  Your incision feels swollen and tender, and the surrounding area looks like a lump. The lump may be red or bluish in color.  You cannot move any part of your body (paralysis).  You cannot control your bladder or bowels. This information is not intended to replace advice given to you by your health care provider. Make sure you discuss any questions you have with your health care provider.   COUMADIN: take 1/2 pill (2.5 mg).  WILL RE-ADJUST DOSE AFTER SEEN ON Monday AT THE COUMADIN CLINIC  MAKE APPOINTMENT TO SEE COUMADIN CLINIC ON Monday  OK TO RESTART PLAVIX ON MONDAY

## 2018-06-08 NOTE — Consult Note (Signed)
Medical Consultation   [name]  GLO:[id number]  DOB: August 28, 1965  DOA: 06/06/2018  PCP: Biagio Borg, MD   Outpatient Specialists: Dr. Stanford Breed (cardiology)   Requesting physician: Dr. Rolena Infante (orthopedic)   Reason for consultation: Glycemic-control   History of Present Illness: [place] Grosser is an 53 y.o. male CAD status post CABG, ascending aortic dissection status post Bentall procedure and mechanical AVR, non-insulin dependent DM, hypertension, hypothyroidism, and right L3-4 HNP admitted to the hospital by orthopedic surgery today for L3-4 discectomy. He is immediately status-post L3-4 discectomy, tolerated well and sent to PACU in stable condition. He uses metformin and glimepiride for DM, reports intolerance to insulin glargine, is not willing to try another formulation, and hospitalists are asked to assist with glycemic-control while in hospital.   Patient interviewed and examined shortly after surgery; he reports "I can feel my leg now and the back feels much better."    06/07/2018: Patient seen and examined at his bedside.  Reports dizziness while sitting on a chair. No headache.  Blood sugar is elevated we will increase his glimepiride to 4 mg daily.  06/08/2018: Patient seen and examined at his bedside.  No dizziness.  No new complaints.  No nausea or hypoglycemic events.  Tolerated increase in glimepiride to 4 mg well.  Planning discharge today per primary team.    Review of Systems:  ROS As per HPI otherwise 10 point review of systems negative.    Past Medical History: Past Medical History:  Diagnosis Date  . Ascending aortic dissection ([place])    a. 2000 - with aortic valve involvement. S/p repair of aortic dissection with placement of mechanical AVR.  [name] CAD (coronary artery disease)    a. 2000 VG->PDA @ time of Ao dissection repair;  b. Cardiac CT 06/2010: no CAD, only mild plaque in prox RCA;  c. 2015 Cath: LM nl, LAD nl, LCX nl, RCA 100,  VG->PDA 90 (4.5x12 Rebel BMS). d. 10/23/17 instent restenosis SVG to RCA, DES placed.  . Cholelithiasis 08/22/2013  . Chronic diastolic CHF (congestive heart failure) ([place])    a. 03/2016 Echo: EF 55-60%, no rwma, Gr2 DD.  . Diabetes mellitus   . Dyspnea   . Gross hematuria   . H/O mechanical aortic valve replacement    a. 03/2016 Echo: AoV mean gradient 94mmHg, Valve Area (VTI) 1.36 cm^2, (Vmax) 1.22 cm^2, mod dil LA.  [name] Hyperlipidemia   . Hypertension   . NEPHROLITHIASIS, HX OF   . Obesity   . PAF (paroxysmal atrial fibrillation) ([place])    a. H/o such, with recurrence of coarse afib/flutter during ER consult 03/2012.  . Stroke ([place])   . TRANSIENT ISCHEMIC ATTACK, HX OF    a. In 2004.  [name] Unspecified vitamin D deficiency     Past Surgical History: Past Surgical History:  Procedure Laterality Date  . AORTIC VALVE REPLACEMENT  2000  . APPENDECTOMY  1998  . CORONARY ARTERY BYPASS GRAFT  2000  . CORONARY STENT INTERVENTION N/A 10/23/2017   Procedure: CORONARY STENT INTERVENTION;  Surgeon: [name], MD;  Location: [facility];  Service: Cardiovascular;  Laterality: N/A;  . LEFT AND RIGHT HEART CATHETERIZATION WITH CORONARY ANGIOGRAM N/A 07/19/2013   Procedure: LEFT AND RIGHT HEART CATHETERIZATION WITH CORONARY ANGIOGRAM;  Surgeon: [name], MD;  Location: [facility] CATH LAB;  Service: Cardiovascular;  Laterality: N/A;  . LUMBAR LAMINECTOMY/DECOMPRESSION MICRODISCECTOMY Right 06/06/2018  Procedure: Right L3-4 disectomy;  Surgeon: Melina Schools, MD;  Location: [facility];  Service: Orthopedics;  Laterality: Right;  2.5 hrs  . RIGHT HEART CATH AND CORONARY/GRAFT ANGIOGRAPHY N/A 10/23/2017   Procedure: RIGHT HEART CATH AND CORONARY/GRAFT ANGIOGRAPHY;  Surgeon: [name], MD;  Location: [facility];  Service: Cardiovascular;  Laterality: N/A;  . TEE WITHOUT CARDIOVERSION N/A 07/22/2013   Procedure: TRANSESOPHAGEAL ECHOCARDIOGRAM (TEE);  Surgeon: [name], MD;   Location: [facility] ENDOSCOPY;  Service: Cardiovascular;  Laterality: N/A;     Allergies:   Allergies  Allergen Reactions  . Diltiazem Hives  . Insulin Glargine Swelling    edema     Social History:  reports that he has never smoked. He has never used smokeless tobacco. He reports that he does not drink alcohol or use drugs.   Family History: Family History  Problem Relation Age of Onset  . Coronary artery disease Mother   . Hypertension Mother   . Diabetes Brother   . Coronary artery disease Other 20       male 1st degree relative, CABG in his 43's (father)     Physical Exam: Vitals:   06/07/18 2010 06/07/18 2243 06/08/18 0514 06/08/18 0748  BP: 99/64 107/67 105/74 105/74  Pulse: 75 75 70 68  Resp: 18 18 20 16   Temp: 98.2 F (36.8 C) 98.7 F (37.1 C) 98.5 F (36.9 C) 98.2 F (36.8 C)  TempSrc: Oral Oral Oral Oral  SpO2: 94% 95% 100% 99%  Weight:      Height:        Constitutional: Well-developed well-nourished no acute distress.  Alert oriented x3.  Eyes: PERLA, EOMI, irises appear normal, anicteric sclera,  ENMT: external ears and nose appear normal, normal hearing           Lips appears normal, oropharynx mucosa, tongue, posterior pharynx appear normal  Neck: neck appears normal, no masses, normal ROM, no thyromegaly, no JVD  CVS: Regular rate and regular rhythm no rubs or gallops.  No JVD or thyromegaly noted. Respiratory: Clear to auscultation with no wheezes or rales.  Good inspiratory effort.  Musculoskeletal: Trace edema noted in lower extremities bilaterally Neuro: Cranial nerves II-XII intact, strength, sensation, reflexes Psych: judgement and insight appear normal, stable mood and affect, mental status  Data reviewed:  I have personally reviewed following labs and imaging studies Labs:  CBC: Recent Labs  Lab 06/07/18 1022  WBC 8.7  HGB 11.4*  HCT 32.7*  MCV 81.5  PLT 829    Basic Metabolic Panel: Recent Labs  Lab 06/07/18 1022  NA 138  K  4.6  CL 104  CO2 26  GLUCOSE 185*  BUN 12  CREATININE 1.19  CALCIUM 8.7*   GFR Estimated Creatinine Clearance: 100.3 mL/min (by C-G formula based on SCr of 1.19 mg/dL). Liver Function Tests: No results for input(s): AST, ALT, ALKPHOS, BILITOT, PROT, ALBUMIN in the last 168 hours. No results for input(s): LIPASE, AMYLASE in the last 168 hours. No results for input(s): AMMONIA in the last 168 hours. Coagulation profile Recent Labs  Lab 06/06/18 1423  INR 1.08    Cardiac Enzymes: No results for input(s): CKTOTAL, CKMB, CKMBINDEX, TROPONINI in the last 168 hours. BNP: Invalid input(s): POCBNP CBG: Recent Labs  Lab 06/07/18 0624 06/07/18 1205 06/07/18 1721 06/07/18 2151 06/08/18 0657  GLUCAP 215* 158* 123* 98 172*   D-Dimer No results for input(s): DDIMER in the last 72 hours. Hgb A1c Recent Labs  06/07/18 1022  HGBA1C 7.0*   Lipid Profile No results for input(s): CHOL, HDL, LDLCALC, TRIG, CHOLHDL, LDLDIRECT in the last 72 hours. Thyroid function studies No results for input(s): TSH, T4TOTAL, T3FREE, THYROIDAB in the last 72 hours.  Invalid input(s): FREET3 Anemia work up No results for input(s): VITAMINB12, FOLATE, FERRITIN, TIBC, IRON, RETICCTPCT in the last 72 hours. Urinalysis    Component Value Date/Time   COLORURINE YELLOW 08/31/2017 0906   APPEARANCEUR CLEAR 08/31/2017 0906   LABSPEC <=1.005 (A) 08/31/2017 0906   PHURINE 6.0 08/31/2017 0906   GLUCOSEU 100 (A) 08/31/2017 0906   HGBUR NEGATIVE 08/31/2017 0906   BILIRUBINUR NEGATIVE 08/31/2017 0906   KETONESUR NEGATIVE 08/31/2017 0906   PROTEINUR 30 (A) 12/31/2009 1606   UROBILINOGEN 1.0 08/31/2017 0906   NITRITE NEGATIVE 08/31/2017 0906   LEUKOCYTESUR NEGATIVE 08/31/2017 0906     Microbiology Recent Results (from the past 240 hour(s))  Surgical pcr screen     Status: Abnormal   Collection Time: 05/31/18  1:54 PM  Result Value Ref Range Status   MRSA, PCR NEGATIVE NEGATIVE Final    Staphylococcus aureus POSITIVE (A) NEGATIVE Final    Comment: (NOTE) The Xpert SA Assay (FDA approved for NASAL specimens in patients 44 years of age and older), is one component of a comprehensive surveillance program. It is not intended to diagnose infection nor to guide or monitor treatment. Performed at [facility], [place] [address], [place], [place] [address]        Inpatient Medications:   Scheduled Meds: . atorvastatin  40 mg Oral q1800  . enoxaparin (LOVENOX) injection  40 mg Subcutaneous Q24H  . furosemide  40 mg Oral BID  . glimepiride  4 mg Oral Q breakfast  . levothyroxine  50 mcg Oral Daily  . losartan  100 mg Oral Daily  . magnesium citrate  1 Bottle Oral Once  . metFORMIN  1,000 mg Oral BID WC  . metoprolol  200 mg Oral Daily  . potassium chloride  20 mEq Oral Daily  . sodium chloride flush  3 mL Intravenous Q12H  . warfarin  2.5 mg Oral q1800  . Warfarin - Physician Dosing Inpatient   Does not apply q1800   Continuous Infusions: . sodium chloride    . lactated ringers 10 mL/hr at 06/06/18 1419  . lactated ringers Stopped (06/07/18 1755)  . methocarbamol (ROBAXIN) IV       Radiological Exams on Admission: Dg Lumbar Spine 2-3 Views  Result Date: 06/06/2018 CLINICAL DATA:  L3-4 microdiscectomy. EXAM: DG C-ARM 61-120 MIN; LUMBAR SPINE - 2-3 VIEW COMPARISON:  MRI lumbar spine 04/24/2018 FINDINGS: Intraoperative fluoroscopy is utilized for surgical control purposes. Fluoroscopy time is recorded at 8 seconds. A single spot fluoroscopic images obtained. Spot fluoroscopic image obtained demonstrates a radiopaque marker placed over the posterior elements at the level of the L4 pedicle. Superficial sponge marker is present. A retractor is seen superficially. Note that there is a somewhat transitional lumbosacral vertebra. Using the same segmental numbering convention as on the prior MRI, the last well-formed intervertebral disc is reported as L5-S1 and labeled as  such on the image. IMPRESSION: Intraoperative fluoroscopy utilized for surgical control purposes. Electronically Signed   By: Lucienne Capers M.D.   On: 06/06/2018 19:27   Ct Head Wo Contrast  Result Date: 06/07/2018 CLINICAL DATA:  Headache after questionable trauma during surgical procedure EXAM: CT HEAD WITHOUT CONTRAST TECHNIQUE: Contiguous axial images were obtained from the base of the skull through  the vertex without intravenous contrast. COMPARISON:  June 05, 2014 FINDINGS: Brain: Ventricles are normal in size and configuration. There is no demonstrable intracranial mass, hemorrhage, extra-axial fluid collection, or midline shift. There are prior infarcts in the region of the head of the caudate nucleus on the left and in the nearby anterior superior left lentiform nucleus. There is evidence of a prior infarct in the anterior superior right cerebellum. There is also evidence of a prior infarct in the posterior mid right cerebellum. A tiny lacunar infarct is noted in the mid left cerebellum, chronic in appearance. No acute appearing infarct is evident currently. Vascular: There is no appreciable hyperdense vessel. There is calcification in each carotid siphon region. Skull: There are bony defects in each posterior parietal region. Bony calvarium elsewhere appears intact. Sinuses/Orbits: There is opacification in multiple ethmoid air cells. Other paranasal sinuses are clear. Orbits appear symmetric bilaterally. Other: Mastoid air cells are clear. IMPRESSION: Several supratentorial infratentorial prior appearing infarcts noted. No acute infarct is demonstrable. There is no evident mass, hemorrhage, or extra-axial fluid collection. Ventricles are located in the midline. There are foci of arterial vascular calcification. Symmetric posterior right parietal bony defects are noted with bony calvarium otherwise appearing intact. There is ethmoid sinus disease at several sites. Electronically Signed   By:  Lowella Grip III M.D.   On: 06/07/2018 10:14   Dg C-arm 1-60 Min  Result Date: 06/06/2018 CLINICAL DATA:  L3-4 microdiscectomy. EXAM: DG C-ARM 61-120 MIN; LUMBAR SPINE - 2-3 VIEW COMPARISON:  MRI lumbar spine 04/24/2018 FINDINGS: Intraoperative fluoroscopy is utilized for surgical control purposes. Fluoroscopy time is recorded at 8 seconds. A single spot fluoroscopic images obtained. Spot fluoroscopic image obtained demonstrates a radiopaque marker placed over the posterior elements at the level of the L4 pedicle. Superficial sponge marker is present. A retractor is seen superficially. Note that there is a somewhat transitional lumbosacral vertebra. Using the same segmental numbering convention as on the prior MRI, the last well-formed intervertebral disc is reported as L5-S1 and labeled as such on the image. IMPRESSION: Intraoperative fluoroscopy utilized for surgical control purposes. Electronically Signed   By: Lucienne Capers M.D.   On: 06/06/2018 19:27    Impression/Recommendations Principal Problem:   Lumbar disc herniation Active Problems:   Diabetes mellitus type II, non insulin dependent (HCC)   Chronic diastolic CHF (congestive heart failure) (HCC)   Coronary artery disease involving native heart without angina pectoris  Type 2 diabetes with hyperglycemia, resolving Last A1c done in October 2019 revealed A1c 6.2 Blood sugar improved after increasing glimepiride to 4 mg daily Continue metformin 1000 mg twice daily and glimepiride 4 mg daily Follow-up with your PCP Hemoglobin A1c on 06/07/2018 was 7.0.  Resolved hypokalemia post repletion Potassium 4.6 on 06/07/2018 from 3.3 on 05/31/2018  Lumbar disc herniation  - Status-post L3-4 discectomy 06/06/18  - Continue post-op care per ortho   CAD  - Status-post CABG and more recent stenting  - Denies any anginal sxs since his stent was placed in June '19  - Continue antiplatelet, statin, beta-blocker, ARB   PAF  - In a regular  rhythm  - CHADS-VASc 4 (CAD, DM, HTN, CHF)  - On Coumadin with Lovenox bridge - Follow-up outpatient per primary team  HTN  - BP at goal  - Continue Toprol, Norvasc, losartan    Hypothyroidism  - Continue Synthroid   Chronic diastolic CHF  - Appears compensated   - Continue Lasix, ARB, beta-blocker, daily wt  Thank you for this consultation.  Our [facility] hospitalist team will sign off.   Time Spent: 35 mins  Kayleen Memos M.D. [facility] 06/08/2018, 12:10 PM

## 2018-06-08 NOTE — Progress Notes (Signed)
Physical Therapy Treatment and Discharge Patient Details Name: [name] MRN: [id number] DOB: 1966/04/23 Today's Date: 06/08/2018    History of Present Illness Pt is a 53 y/o male who presents s/p L3-L4 laminectomy/microdiscectomy on 06/06/2018. PMH significant for TIA, CVA, aortic valve replacement, DM, CHF, CAD, HTN, CABG    PT Comments    Pt progressing well with post-op mobility. Pt reports significant improvement in headache symptoms and was able to tolerate OOB mobility without difficulty. Overall, continues to be moving slow, but overall steady without an AD. Pt asking about using a RW at home - I currently don't feel the RW is needed, however we discussed the option of a [facility] for community ambulation. Will sign off at this time as acute PT goals are met/adequate for d/c. If needs change, please reconsult.    Follow Up Recommendations  No PT follow up;Supervision - Intermittent     Equipment Recommendations  None recommended by PT    Recommendations for Other Services       Precautions / Restrictions Precautions Precautions: Fall;Back Precaution Booklet Issued: Yes (comment) Precaution Comments: able to recall 3/3 without cues Required Braces or Orthoses: Spinal Brace Spinal Brace: Lumbar corset;Applied in sitting position Restrictions Weight Bearing Restrictions: No    Mobility  Bed Mobility               General bed mobility comments: Pt sitting up EOB when PT arrived.   Transfers Overall transfer level: Modified independent Equipment used: None Transfers: Sit to/from Stand Sit to Stand: Min guard Stand pivot transfers: Min guard       General transfer comment: Increased time but pt able to power-up to full stand without difficulty. Pt reports he had increased pain attempting to stand from low recliner chair, and pt was educated on positioning recommendations to boost seat up at home to make transitions easier.   Ambulation/Gait Ambulation/Gait  assistance: Modified independent (Device/Increase time) Gait Distance (Feet): 300 Feet Assistive device: None Gait Pattern/deviations: Step-through pattern;Decreased stride length;Trunk flexed Gait velocity: Decreased Gait velocity interpretation: 1.31 - 2.62 ft/sec, indicative of limited community ambulator General Gait Details: VC's for improved posture. Pt ambulating slow but generally steady. No assist provided and no overt LOB noted.   Stairs         General stair comments: Pt declined further stair training however we verbally reviewed preferred method of negotiating stairs.    Wheelchair Mobility    Modified Rankin (Stroke Patients Only)       Balance Overall balance assessment: Needs assistance Sitting-balance support: Feet supported;No upper extremity supported Sitting balance-Leahy Scale: Fair     Standing balance support: No upper extremity supported Standing balance-Leahy Scale: Fair                              Cognition Arousal/Alertness: Awake/alert Behavior During Therapy: WFL for tasks assessed/performed Overall Cognitive Status: Within Functional Limits for tasks assessed                                        Exercises      General Comments        Pertinent Vitals/Pain Pain Assessment: 0-10 Pain Score: 5  Pain Location: Incision site during ambulation Pain Descriptors / Indicators: Operative site guarding Pain Intervention(s): Monitored during session    Home Living  Prior Function            PT Goals (current goals can now be found in the care plan section) Acute Rehab PT Goals Patient Stated Goal: Get back to the gym PT Goal Formulation: With patient Time For Goal Achievement: 06/14/18 Potential to Achieve Goals: Good Progress towards PT goals: Goals met/education completed, patient discharged from PT    Frequency    Min 5X/week      PT Plan Current plan remains  appropriate    Co-evaluation              AM-PAC PT "6 Clicks" Mobility   Outcome Measure  Help needed turning from your back to your side while in a flat bed without using bedrails?: None Help needed moving from lying on your back to sitting on the side of a flat bed without using bedrails?: None Help needed moving to and from a bed to a chair (including a wheelchair)?: None Help needed standing up from a chair using your arms (e.g., wheelchair or bedside chair)?: None Help needed to walk in hospital room?: None Help needed climbing 3-5 steps with a railing? : A Little 6 Click Score: 23    End of Session Equipment Utilized During Treatment: Back brace Activity Tolerance: Patient tolerated treatment well Patient left: in chair;with call bell/phone within reach Nurse Communication: Mobility status PT Visit Diagnosis: Unsteadiness on feet (R26.81);Pain Pain - part of body: (back)     Time: 4967-5916 PT Time Calculation (min) (ACUTE ONLY): 9 min  Charges:  $Gait Training: 8-22 mins                     Rolinda Roan, PT, DPT Acute Rehabilitation Services Pager: [phone] Office: [phone]    Thelma Comp 06/08/2018, 10:11 AM

## 2018-06-08 NOTE — Progress Notes (Signed)
Occupational Therapy Treatment Patient Details Name: [name] MRN: [id number] DOB: 1966/05/15 Today's Date: 06/08/2018    History of present illness Pt is a 53 y/o male who presents s/p L3-L4 laminectomy/microdiscectomy on 06/06/2018. PMH significant for TIA, CVA, aortic valve replacement, DM, CHF, CAD, HTN, CABG   OT comments  Pt. Progressing well with skilled OT. Able to demonstrate safe technique with toileting tasks and tub transfer.  Reports wife and dtr. Able to assist at home as needed. Note likely d/c home later today.   Follow Up Recommendations  No OT follow up;Supervision/Assistance - 24 hour    Equipment Recommendations  3 in 1 bedside commode    Recommendations for Other Services      Precautions / Restrictions Precautions Precautions: Fall;Back Precaution Comments: able to recall 3/3 without cues Required Braces or Orthoses: Spinal Brace Spinal Brace: Lumbar corset;Applied in sitting position Restrictions Weight Bearing Restrictions: No       Mobility Bed Mobility               General bed mobility comments: seated in recliner at beginning of session, eob at end of session  Transfers Overall transfer level: Needs assistance Equipment used: None Transfers: Sit to/from [employer] Sit to Stand: Min guard Stand pivot transfers: Min guard       General transfer comment: noted increased time for tansition into standing from lower surfaces. cues to push through B UEs.     Balance                                           ADL either performed or assessed with clinical judgement   ADL Overall ADL's : Needs assistance/impaired               Lower Body Bathing Details (indicate cue type and reason): reports he has a plan for dtr. and wife to assist as needed       Lower Body Dressing Details (indicate cue type and reason): pt. reports wife and dtr. to assist as needed Toilet Transfer:  Supervision/safety;Ambulation       Tub/ Shower Transfer: Tub transfer;Min Chief Executive Officer Details (indicate cue type and reason): side step over simulated tub with pt. indicating height of tub.  reviewed having wife and/or dtr. with him during in/out of tub/shower.   Functional mobility during ADLs: Min guard General ADL Comments: pt. moving well, reports he has not been ambulatin with a RW.  able to amb. in room today without DME. no lob noted. reviewed if furniture walking needed for pt. not to  hold onto anything that is  not secure ie: soap dishes, towel rods ect.  pt. reports wife and dtr. will assist as needed for all LB ADLs.      Vision       Perception     Praxis      Cognition Arousal/Alertness: Awake/alert Behavior During Therapy: WFL for tasks assessed/performed Overall Cognitive Status: Within Functional Limits for tasks assessed                                          Exercises     Shoulder Instructions       General Comments      Pertinent Vitals/ Pain  Pain Assessment: No/denies pain  Home Living                                          Prior Functioning/Environment              Frequency  Min 2X/week        Progress Toward Goals  OT Goals(current goals can now be found in the care plan section)  Progress towards OT goals: Progressing toward goals     Plan Discharge plan remains appropriate    Co-evaluation                 AM-PAC OT "6 Clicks" Daily Activity     Outcome Measure   Help from another person eating meals?: None Help from another person taking care of personal grooming?: A Little Help from another person toileting, which includes using toliet, bedpan, or urinal?: A Little Help from another person bathing (including washing, rinsing, drying)?: A Little Help from another person to put on and taking off regular upper body clothing?: A Little Help from another  person to put on and taking off regular lower body clothing?: A Little 6 Click Score: 19    End of Session Equipment Utilized During Treatment: Gait belt;Back brace  OT Visit Diagnosis: Other abnormalities of gait and mobility (R26.89);Pain   Activity Tolerance Patient tolerated treatment well   Patient Left Other (comment)(eob with rn removing drain)   Nurse Communication          Time: 6979-4801 OT Time Calculation (min): 10 min  Charges: OT General Charges $OT Visit: 1 Visit OT Treatments $Self Care/Home Management : 8-22 mins   Janice Coffin, COTA/L 06/08/2018, 9:13 AM

## 2018-06-11 ENCOUNTER — Telehealth: Payer: Self-pay | Admitting: *Deleted

## 2018-06-11 NOTE — Telephone Encounter (Signed)
Pt was on patient ping portal. Patient was admitted 06/06/18, and went underwent an L3-4 Discectomy low back and right leg radiculopathy. Pt D/C 06/08/18, and will f/u w/ cardiology 06/11/18 for coumadin check and see the specialist in 2 weeks.Marland KitchenJohny Chess

## 2018-06-11 NOTE — Discharge Summary (Signed)
Patient ID: [name] MRN: [id number] DOB/AGE: Jul 19, 1965 53 y.o.  Admit date: 06/06/2018 Discharge date: 06/11/2018  Admission Diagnoses:  Principal Problem:   Lumbar disc herniation Active Problems:   Diabetes mellitus type II, non insulin dependent (HCC)   Chronic diastolic CHF (congestive heart failure) (HCC)   Coronary artery disease involving native heart without angina pectoris   Discharge Diagnoses:  Principal Problem:   Lumbar disc herniation Active Problems:   Diabetes mellitus type II, non insulin dependent (HCC)   Chronic diastolic CHF (congestive heart failure) (HCC)   Coronary artery disease involving native heart without angina pectoris  status post Procedure(s): Right L3-4 disectomy  Past Medical History:  Diagnosis Date  . Ascending aortic dissection ([place])    a. 2000 - with aortic valve involvement. S/p repair of aortic dissection with placement of mechanical AVR.  [name] CAD (coronary artery disease)    a. 2000 VG->PDA @ time of Ao dissection repair;  b. Cardiac CT 06/2010: no CAD, only mild plaque in prox RCA;  c. 2015 Cath: LM nl, LAD nl, LCX nl, RCA 100, VG->PDA 90 (4.5x12 Rebel BMS). d. 10/23/17 instent restenosis SVG to RCA, DES placed.  . Cholelithiasis 08/22/2013  . Chronic diastolic CHF (congestive heart failure) ([place])    a. 03/2016 Echo: EF 55-60%, no rwma, Gr2 DD.  . Diabetes mellitus   . Dyspnea   . Gross hematuria   . H/O mechanical aortic valve replacement    a. 03/2016 Echo: AoV mean gradient 79mmHg, Valve Area (VTI) 1.36 cm^2, (Vmax) 1.22 cm^2, mod dil LA.  [name] Hyperlipidemia   . Hypertension   . NEPHROLITHIASIS, HX OF   . Obesity   . PAF (paroxysmal atrial fibrillation) ([place])    a. H/o such, with recurrence of coarse afib/flutter during ER consult 03/2012.  . Stroke ([place])   . TRANSIENT ISCHEMIC ATTACK, HX OF    a. In 2004.  [name] Unspecified vitamin D deficiency     Surgeries: Procedure(s): Right L3-4 disectomy on 06/06/2018     Consultants:   Discharged Condition: Improved  Hospital Course: [name] is an 53 y.o. male who was admitted 06/06/2018 for operative treatment of Lumbar disc herniation. Patient failed conservative treatments (please see the history and physical for the specifics) and had severe unremitting pain that affects sleep, daily activities and work/hobbies. After pre-op clearance, the patient was taken to the operating room on 06/06/2018 and underwent  Procedure(s): Right L3-4 disectomy.    Patient was given perioperative antibiotics:  Anti-infectives (From admission, onward)   Start     Dose/Rate Route Frequency Ordered Stop   06/07/18 0100  ceFAZolin (ANCEF) IVPB 1 g/50 mL premix     1 g 100 mL/hr over 30 Minutes Intravenous Every 8 hours 06/06/18 2044 06/07/18 0940   06/06/18 1411  ceFAZolin (ANCEF) 2-4 GM/100ML-% IVPB    Note to Pharmacy:  Jasmine Pang   : cabinet override      06/06/18 1411 06/07/18 0214   06/06/18 0600  ceFAZolin (ANCEF) 3 g in dextrose 5 % 50 mL IVPB  Status:  Discontinued     3 g 100 mL/hr over 30 Minutes Intravenous 30 min pre-op 06/05/18 0919 06/06/18 2042       Patient was given sequential compression devices and early ambulation to prevent DVT.   Patient benefited maximally from hospital stay and there were no complications. At the time of discharge, the patient was urinating/moving their bowels without difficulty, tolerating a regular diet, pain is controlled with  oral pain medications and they have been cleared by PT/OT.    Hospital course: patient complained of headache on POD#1.  Since he had the head injury during positioning for surgery a CT of the head was ordered.  THis was unremarkable.  Continued to monitor clinical exam.    Headache improved.  Drain was removed and anti-coagulation was restarted.  I spoke with Dr Forrestine Him and he approved the plan of lovenox and coumadin.  Restart plavix on Monday and return to coumadin clinic for repeat labs  and adjustment of dose.  Patient remained neurologically intact - no signs or symptoms of radiculopathy or cauda equina.    Recent vital signs: No data found.   Recent laboratory studies: No results for input(s): WBC, HGB, HCT, PLT, NA, K, CL, CO2, BUN, CREATININE, GLUCOSE, INR, CALCIUM in the last 72 hours.  Invalid input(s): PT, 2   Discharge Medications:   Allergies as of 06/08/2018      Reactions   Diltiazem Hives   Insulin Glargine Swelling   edema      Medication List    STOP taking these medications   acetaminophen 325 MG tablet Commonly known as:  TYLENOL   cyclobenzaprine 5 MG tablet Commonly known as:  FLEXERIL   enoxaparin 120 MG/0.8ML injection Commonly known as:  LOVENOX Replaced by:  enoxaparin 40 MG/0.4ML injection   hydrocortisone cream 1 %   methylPREDNISolone 4 MG Tbpk tablet Commonly known as:  MEDROL DOSEPAK   Potassium Chloride ER 20 MEQ Tbcr   traMADol 50 MG tablet Commonly known as:  ULTRAM   Vitamin D (Ergocalciferol) 1.25 MG (50000 UT) Caps capsule Commonly known as:  DRISDOL     TAKE these medications   amLODipine 10 MG tablet Commonly known as:  NORVASC TAKE 1 TABLET(10 MG) BY MOUTH DAILY What changed:  See the new instructions.   atorvastatin 40 MG tablet Commonly known as:  LIPITOR Take 1 tablet (40 mg total) by mouth daily at 6 PM. What changed:  when to take this   cholecalciferol 1000 units tablet Commonly known as:  VITAMIN D Take 1,000 Units by mouth daily.   clopidogrel 75 MG tablet Commonly known as:  PLAVIX Take 1 tablet (75 mg total) by mouth daily.   enoxaparin 40 MG/0.4ML injection Commonly known as:  LOVENOX Inject 0.4 mLs (40 mg total) into the skin daily. Replaces:  enoxaparin 120 MG/0.8ML injection   furosemide 40 MG tablet Commonly known as:  LASIX Take 1 tablet (40 mg total) by mouth 2 (two) times daily.   gabapentin 300 MG capsule Commonly known as:  NEURONTIN Take 1 capsule (300 mg total) by  mouth 3 (three) times daily.   glimepiride 1 MG tablet Commonly known as:  AMARYL TAKE 3 TABLETS ONE TIME DAILY WITH BREAKFAST What changed:  See the new instructions.   levothyroxine 50 MCG tablet Commonly known as:  SYNTHROID, LEVOTHROID TAKE 1 TABLET EVERY DAY   losartan 100 MG tablet Commonly known as:  COZAAR TAKE 1 TABLET EVERY DAY   metFORMIN 1000 MG tablet Commonly known as:  GLUCOPHAGE TAKE 1 TABLET TWICE DAILY WITH MEALS   methocarbamol 500 MG tablet Commonly known as:  ROBAXIN Take 1 tablet (500 mg total) by mouth every 8 (eight) hours as needed for up to 5 days for muscle spasms.   metoprolol 200 MG 24 hr tablet Commonly known as:  TOPROL-XL Take 1 tablet (200 mg total) by mouth daily. Take with or immediately following a meal.  nitroGLYCERIN 0.4 MG SL tablet Commonly known as:  NITROSTAT Place 1 tablet (0.4 mg total) under the tongue every 5 (five) minutes x 3 doses as needed for chest pain.   ondansetron 4 MG tablet Commonly known as:  ZOFRAN Take 1 tablet (4 mg total) by mouth every 8 (eight) hours as needed for nausea or vomiting.   oxyCODONE-acetaminophen 10-325 MG tablet Commonly known as:  PERCOCET Take 1 tablet by mouth every 6 (six) hours as needed for up to 5 days for pain.   warfarin 5 MG tablet Commonly known as:  COUMADIN Take as directed. If you are unsure how to take this medication, talk to your nurse or doctor. Original instructions:  TAKE 1 TO 1 AND 1/2 TABLETS DAILY AS DIRECTED BY COUMADIN CLINIC       Diagnostic Studies: Dg Lumbar Spine 2-3 Views  Result Date: 06/06/2018 CLINICAL DATA:  L3-4 microdiscectomy. EXAM: DG C-ARM 61-120 MIN; LUMBAR SPINE - 2-3 VIEW COMPARISON:  MRI lumbar spine 04/24/2018 FINDINGS: Intraoperative fluoroscopy is utilized for surgical control purposes. Fluoroscopy time is recorded at 8 seconds. A single spot fluoroscopic images obtained. Spot fluoroscopic image obtained demonstrates a radiopaque marker placed  over the posterior elements at the level of the L4 pedicle. Superficial sponge marker is present. A retractor is seen superficially. Note that there is a somewhat transitional lumbosacral vertebra. Using the same segmental numbering convention as on the prior MRI, the last well-formed intervertebral disc is reported as L5-S1 and labeled as such on the image. IMPRESSION: Intraoperative fluoroscopy utilized for surgical control purposes. Electronically Signed   By: Lucienne Capers M.D.   On: 06/06/2018 19:27   Ct Head Wo Contrast  Result Date: 06/07/2018 CLINICAL DATA:  Headache after questionable trauma during surgical procedure EXAM: CT HEAD WITHOUT CONTRAST TECHNIQUE: Contiguous axial images were obtained from the base of the skull through the vertex without intravenous contrast. COMPARISON:  June 05, 2014 FINDINGS: Brain: Ventricles are normal in size and configuration. There is no demonstrable intracranial mass, hemorrhage, extra-axial fluid collection, or midline shift. There are prior infarcts in the region of the head of the caudate nucleus on the left and in the nearby anterior superior left lentiform nucleus. There is evidence of a prior infarct in the anterior superior right cerebellum. There is also evidence of a prior infarct in the posterior mid right cerebellum. A tiny lacunar infarct is noted in the mid left cerebellum, chronic in appearance. No acute appearing infarct is evident currently. Vascular: There is no appreciable hyperdense vessel. There is calcification in each carotid siphon region. Skull: There are bony defects in each posterior parietal region. Bony calvarium elsewhere appears intact. Sinuses/Orbits: There is opacification in multiple ethmoid air cells. Other paranasal sinuses are clear. Orbits appear symmetric bilaterally. Other: Mastoid air cells are clear. IMPRESSION: Several supratentorial infratentorial prior appearing infarcts noted. No acute infarct is demonstrable. There  is no evident mass, hemorrhage, or extra-axial fluid collection. Ventricles are located in the midline. There are foci of arterial vascular calcification. Symmetric posterior right parietal bony defects are noted with bony calvarium otherwise appearing intact. There is ethmoid sinus disease at several sites. Electronically Signed   By: Lowella Grip III M.D.   On: 06/07/2018 10:14   Dg C-arm 1-60 Min  Result Date: 06/06/2018 CLINICAL DATA:  L3-4 microdiscectomy. EXAM: DG C-ARM 61-120 MIN; LUMBAR SPINE - 2-3 VIEW COMPARISON:  MRI lumbar spine 04/24/2018 FINDINGS: Intraoperative fluoroscopy is utilized for surgical control purposes. Fluoroscopy time is recorded at  8 seconds. A single spot fluoroscopic images obtained. Spot fluoroscopic image obtained demonstrates a radiopaque marker placed over the posterior elements at the level of the L4 pedicle. Superficial sponge marker is present. A retractor is seen superficially. Note that there is a somewhat transitional lumbosacral vertebra. Using the same segmental numbering convention as on the prior MRI, the last well-formed intervertebral disc is reported as L5-S1 and labeled as such on the image. IMPRESSION: Intraoperative fluoroscopy utilized for surgical control purposes. Electronically Signed   By: Lucienne Capers M.D.   On: 06/06/2018 19:27    Discharge Instructions    Incentive spirometry RT   Complete by:  As directed       Follow-up Information    Lelon Perla, MD. Go on 06/11/2018.   Specialty:  Cardiology Why:  COUMADIN CLINIC TO CHECK INR. Contact information: [address] [place] [address] [phone]           Discharge Plan:  discharge to home  Disposition: stable    Signed: Dahlia Bailiff for Dr. Melina Schools [facility] [phone] 06/11/2018, 11:27 AM

## 2018-06-12 ENCOUNTER — Ambulatory Visit (INDEPENDENT_AMBULATORY_CARE_PROVIDER_SITE_OTHER): Payer: Medicare HMO | Admitting: Pharmacist

## 2018-06-12 DIAGNOSIS — I480 Paroxysmal atrial fibrillation: Secondary | ICD-10-CM

## 2018-06-12 DIAGNOSIS — Z5181 Encounter for therapeutic drug level monitoring: Secondary | ICD-10-CM

## 2018-06-12 LAB — POCT INR: INR: 1 — AB (ref 2.0–3.0)

## 2018-06-12 MED ORDER — ENOXAPARIN SODIUM 120 MG/0.8ML ~~LOC~~ SOLN: 120.0000 mg | Freq: Two times a day (BID) | SUBCUTANEOUS | 1 refills | Status: DC

## 2018-06-12 NOTE — Patient Instructions (Signed)
Take 2 tablets today, tomorrow and Thursday then continue taking 1 tablet daily except 1.5 tablets on Tuesday, Thursday, and Saturday.  Next INR in 1 weeks.  Continue lovenox injections 120mg  twice a day. Call us with any new medications or concerns [phone] [facility] [phone].

## 2018-06-18 ENCOUNTER — Ambulatory Visit (INDEPENDENT_AMBULATORY_CARE_PROVIDER_SITE_OTHER): Payer: Medicare HMO

## 2018-06-18 DIAGNOSIS — Z5181 Encounter for therapeutic drug level monitoring: Secondary | ICD-10-CM | POA: Diagnosis not present

## 2018-06-18 DIAGNOSIS — I480 Paroxysmal atrial fibrillation: Secondary | ICD-10-CM | POA: Diagnosis not present

## 2018-06-18 LAB — POCT INR: INR: 2 (ref 2.0–3.0)

## 2018-06-18 NOTE — Patient Instructions (Signed)
Description   Take 2 tablets today and tomorrow, then resume same dosage 1 tablet daily except 1.5 tablets on Tuesday, Thursday, and Saturday.  Next INR in 1 week.  Continue lovenox injections 120mg  twice a day, discontinue Lovenox on Wednesday. Call us with any new medications or concerns [phone] [facility] [phone].

## 2018-06-19 ENCOUNTER — Other Ambulatory Visit: Payer: Self-pay | Admitting: Pharmacist

## 2018-06-19 DIAGNOSIS — Z5181 Encounter for therapeutic drug level monitoring: Secondary | ICD-10-CM

## 2018-06-19 DIAGNOSIS — I480 Paroxysmal atrial fibrillation: Secondary | ICD-10-CM

## 2018-06-19 MED ORDER — ENOXAPARIN SODIUM 120 MG/0.8ML ~~LOC~~ SOLN: 120.0000 mg | Freq: Two times a day (BID) | SUBCUTANEOUS | 1 refills | Status: DC

## 2018-06-25 ENCOUNTER — Ambulatory Visit (INDEPENDENT_AMBULATORY_CARE_PROVIDER_SITE_OTHER): Payer: Medicare HMO | Admitting: Pharmacist

## 2018-06-25 DIAGNOSIS — I480 Paroxysmal atrial fibrillation: Secondary | ICD-10-CM

## 2018-06-25 DIAGNOSIS — Z5181 Encounter for therapeutic drug level monitoring: Secondary | ICD-10-CM

## 2018-06-25 LAB — POCT INR: INR: 2.4 (ref 2.0–3.0)

## 2018-06-25 NOTE — Patient Instructions (Signed)
Description   Take 2 tablets today and tomorrow, then resume same dosage 1 tablet daily except 1.5 tablets on Tuesday, Thursday, and Saturday.  Next INR in 2 week.  Continue lovenox injections 120mg  for one more dose tonigt. Call us with any new medications or concerns [phone] [facility] [phone].

## 2018-06-30 ENCOUNTER — Other Ambulatory Visit: Payer: Self-pay | Admitting: Internal Medicine

## 2018-07-09 ENCOUNTER — Ambulatory Visit (INDEPENDENT_AMBULATORY_CARE_PROVIDER_SITE_OTHER): Payer: Medicare HMO

## 2018-07-09 DIAGNOSIS — Z5181 Encounter for therapeutic drug level monitoring: Secondary | ICD-10-CM | POA: Diagnosis not present

## 2018-07-09 DIAGNOSIS — I480 Paroxysmal atrial fibrillation: Secondary | ICD-10-CM

## 2018-07-09 LAB — POCT INR: INR: 3.1 — AB (ref 2.0–3.0)

## 2018-07-09 NOTE — Patient Instructions (Signed)
Description   Continue on same dosage 1 tablet daily except 1.5 tablets on Tuesdays, Thursdays, and Saturdays.  Next INR in 4 weeks.  Call us with any new medications or concerns #[phone] [facility] #[phone].       

## 2018-08-06 ENCOUNTER — Ambulatory Visit (INDEPENDENT_AMBULATORY_CARE_PROVIDER_SITE_OTHER): Payer: Medicare HMO | Admitting: Pharmacist

## 2018-08-06 ENCOUNTER — Other Ambulatory Visit: Payer: Self-pay

## 2018-08-06 DIAGNOSIS — Z5181 Encounter for therapeutic drug level monitoring: Secondary | ICD-10-CM | POA: Diagnosis not present

## 2018-08-06 DIAGNOSIS — I480 Paroxysmal atrial fibrillation: Secondary | ICD-10-CM

## 2018-08-06 LAB — POCT INR: INR: 2.3 (ref 2.0–3.0)

## 2018-08-06 NOTE — Patient Instructions (Signed)
Description   Take 1.5 tablets this evening, then continue on same dosage 1 tablet daily except 1.5 tablets on Tuesdays, Thursdays, and Saturdays.  Next INR in 3 weeks.  Call us with any new medications or concerns [phone] [facility] [phone].

## 2018-08-23 ENCOUNTER — Telehealth: Payer: Self-pay

## 2018-08-23 NOTE — Telephone Encounter (Signed)
lmom for prescreen/drive thru 

## 2018-08-24 NOTE — Telephone Encounter (Signed)
Patient called to check on appt, please call back.

## 2018-08-24 NOTE — Telephone Encounter (Addendum)
Returned call to the pt and had to leave a msg on voicemail. Instructed pt to call [facility] number back. Will await a call back.

## 2018-08-27 ENCOUNTER — Ambulatory Visit (INDEPENDENT_AMBULATORY_CARE_PROVIDER_SITE_OTHER): Payer: Medicare HMO | Admitting: Pharmacist Clinician (PhC)/ Clinical Pharmacy Specialist

## 2018-08-27 ENCOUNTER — Other Ambulatory Visit: Payer: Self-pay

## 2018-08-27 ENCOUNTER — Telehealth: Payer: Self-pay | Admitting: Pharmacist Clinician (PhC)/ Clinical Pharmacy Specialist

## 2018-08-27 DIAGNOSIS — I480 Paroxysmal atrial fibrillation: Secondary | ICD-10-CM | POA: Diagnosis not present

## 2018-08-27 DIAGNOSIS — Z5181 Encounter for therapeutic drug level monitoring: Secondary | ICD-10-CM

## 2018-08-27 LAB — POCT INR: INR: 3.1 — AB (ref 2.0–3.0)

## 2018-09-07 NOTE — Telephone Encounter (Signed)
error 

## 2018-09-12 ENCOUNTER — Telehealth: Payer: Self-pay | Admitting: *Deleted

## 2018-09-12 ENCOUNTER — Telehealth: Payer: Self-pay | Admitting: Cardiology

## 2018-09-12 NOTE — Telephone Encounter (Signed)
LMOM @ 11:54 am.re: follow up appointment.

## 2018-09-12 NOTE — Telephone Encounter (Signed)
  LVMTCB to schedule recall f/u on 09/20/18

## 2018-09-19 ENCOUNTER — Ambulatory Visit: Payer: Medicare HMO | Admitting: Internal Medicine

## 2018-09-28 ENCOUNTER — Ambulatory Visit (INDEPENDENT_AMBULATORY_CARE_PROVIDER_SITE_OTHER): Payer: Medicare HMO | Admitting: Internal Medicine

## 2018-09-28 DIAGNOSIS — Z0000 Encounter for general adult medical examination without abnormal findings: Secondary | ICD-10-CM | POA: Diagnosis not present

## 2018-09-28 DIAGNOSIS — E119 Type 2 diabetes mellitus without complications: Secondary | ICD-10-CM

## 2018-09-28 MED ORDER — METOPROLOL SUCCINATE ER 200 MG PO TB24: 200.0000 mg | ORAL_TABLET | Freq: Every day | ORAL | 3 refills | Status: DC

## 2018-09-28 MED ORDER — METFORMIN HCL 1000 MG PO TABS: 1000.0000 mg | ORAL_TABLET | Freq: Two times a day (BID) | ORAL | 3 refills | Status: DC

## 2018-09-28 NOTE — Progress Notes (Signed)
Patient ID: [name], male   DOB: [birth year], 53 y.o.   MRN: [id number]  Virtual Visit via Video Note  I connected with [name] on 09/28/18 at  3:20 PM EDT by a video enabled telemedicine application and verified that I am speaking with the correct person using two identifiers.  Location: Patient: at home Provider: at office   I discussed the limitations of evaluation and management by telemedicine and the availability of in person appointments. The patient expressed understanding and agreed to proceed.  History of Present Illness: Here for wellness and f/u;  Overall doing ok;  Pt denies Chest pain, worsening SOB, DOE, wheezing, orthopnea, PND, worsening LE edema, palpitations, dizziness or syncope.  Pt denies neurological change such as new headache, facial or extremity weakness.  Pt denies polydipsia, polyuria, or low sugar symptoms. Pt states overall good compliance with treatment and medications, good tolerability, and has been trying to follow appropriate diet.  Pt denies worsening depressive symptoms, suicidal ideation or panic. No fever, night sweats, wt loss, loss of appetite, or other constitutional symptoms.  Pt states good ability with ADL's, has low fall risk, home safety reviewed and adequate, no other significant changes in hearing or vision, and only occasionally active with exercise.  Due for optho f/u.  Is now s/p lumbar surgury with good improvement or RLE numb/pain, but now with 1 wk of mild worsening intermittent LLE numbness Past Medical History:  Diagnosis Date  . Ascending aortic dissection ([place])    a. 2000 - with aortic valve involvement. S/p repair of aortic dissection with placement of mechanical AVR.  [name] CAD (coronary artery disease)    a. 2000 VG->PDA @ time of Ao dissection repair;  b. Cardiac CT 06/2010: no CAD, only mild plaque in prox RCA;  c. 2015 Cath: LM nl, LAD nl, LCX nl, RCA 100, VG->PDA 90 (4.5x12 Rebel BMS). d. 10/23/17 instent restenosis SVG to  RCA, DES placed.  . Cholelithiasis 08/22/2013  . Chronic diastolic CHF (congestive heart failure) ([place])    a. 03/2016 Echo: EF 55-60%, no rwma, Gr2 DD.  . Diabetes mellitus   . Dyspnea   . Gross hematuria   . H/O mechanical aortic valve replacement    a. 03/2016 Echo: AoV mean gradient 86mmHg, Valve Area (VTI) 1.36 cm^2, (Vmax) 1.22 cm^2, mod dil LA.  [name] Hyperlipidemia   . Hypertension   . NEPHROLITHIASIS, HX OF   . Obesity   . PAF (paroxysmal atrial fibrillation) ([place])    a. H/o such, with recurrence of coarse afib/flutter during ER consult 03/2012.  . Stroke ([place])   . TRANSIENT ISCHEMIC ATTACK, HX OF    a. In 2004.  [name] Unspecified vitamin D deficiency    Past Surgical History:  Procedure Laterality Date  . AORTIC VALVE REPLACEMENT  2000  . APPENDECTOMY  1998  . CORONARY ARTERY BYPASS GRAFT  2000  . CORONARY STENT INTERVENTION N/A 10/23/2017   Procedure: CORONARY STENT INTERVENTION;  Surgeon: [name], MD;  Location: [place] [facility];  Service: Cardiovascular;  Laterality: N/A;  . LEFT AND RIGHT HEART CATHETERIZATION WITH CORONARY ANGIOGRAM N/A 07/19/2013   Procedure: LEFT AND RIGHT HEART CATHETERIZATION WITH CORONARY ANGIOGRAM;  Surgeon: [name], MD;  Location: [facility] CATH LAB;  Service: Cardiovascular;  Laterality: N/A;  . LUMBAR LAMINECTOMY/DECOMPRESSION MICRODISCECTOMY Right 06/06/2018   Procedure: Right L3-4 disectomy;  Surgeon: Melina Schools, MD;  Location: [place];  Service: Orthopedics;  Laterality: Right;  2.5 hrs  . RIGHT HEART CATH  AND CORONARY/GRAFT ANGIOGRAPHY N/A 10/23/2017   Procedure: RIGHT HEART CATH AND CORONARY/GRAFT ANGIOGRAPHY;  Surgeon: [name], MD;  Location: [place] CV LAB;  Service: Cardiovascular;  Laterality: N/A;  . TEE WITHOUT CARDIOVERSION N/A 07/22/2013   Procedure: TRANSESOPHAGEAL ECHOCARDIOGRAM (TEE);  Surgeon: [name], MD;  Location: [facility] ENDOSCOPY;  Service: Cardiovascular;  Laterality: N/A;    reports that he has  never smoked. He has never used smokeless tobacco. He reports that he does not drink alcohol or use drugs. family history includes Coronary artery disease in his mother; Coronary artery disease (age of onset: 6) in an other family member; Diabetes in his brother; Hypertension in his mother. Allergies  Allergen Reactions  . Diltiazem Hives  . Insulin Glargine Swelling    edema   Current Outpatient Medications on File Prior to Visit  Medication Sig Dispense Refill  . amLODipine (NORVASC) 10 MG tablet TAKE 1 TABLET(10 MG) BY MOUTH DAILY (Patient taking differently: Take 10 mg by mouth daily. ) 90 tablet 1  . atorvastatin (LIPITOR) 40 MG tablet Take 1 tablet (40 mg total) by mouth daily at 6 PM. (Patient taking differently: Take 40 mg by mouth daily. ) 90 tablet 3  . cholecalciferol (VITAMIN D) 1000 UNITS tablet Take 1,000 Units by mouth daily.    . clopidogrel (PLAVIX) 75 MG tablet Take 1 tablet (75 mg total) by mouth daily. 90 tablet 3  . cyclobenzaprine (FLEXERIL) 5 MG tablet TAKE 1 TABLET THREE TIMES DAILY AS NEEDED FOR MUSCLE SPASM(S) 60 tablet 2  . enoxaparin (LOVENOX) 120 MG/0.8ML injection Inject 0.8 mLs (120 mg total) into the skin every 12 (twelve) hours. 10 Syringe 1  . furosemide (LASIX) 40 MG tablet Take 1 tablet (40 mg total) by mouth 2 (two) times daily. 180 tablet 3  . gabapentin (NEURONTIN) 300 MG capsule Take 1 capsule (300 mg total) by mouth 3 (three) times daily. 90 capsule 3  . glimepiride (AMARYL) 1 MG tablet TAKE 3 TABLETS ONE TIME DAILY WITH BREAKFAST (Patient taking differently: Take 3 mg by mouth daily with breakfast. ) 270 tablet 3  . levothyroxine (SYNTHROID, LEVOTHROID) 50 MCG tablet TAKE 1 TABLET EVERY DAY 90 tablet 1  . losartan (COZAAR) 100 MG tablet TAKE 1 TABLET EVERY DAY 90 tablet 1  . nitroGLYCERIN (NITROSTAT) 0.4 MG SL tablet Place 1 tablet (0.4 mg total) under the tongue every 5 (five) minutes x 3 doses as needed for chest pain. 25 tablet 2  . ondansetron  (ZOFRAN) 4 MG tablet Take 1 tablet (4 mg total) by mouth every 8 (eight) hours as needed for nausea or vomiting. 20 tablet 0  . warfarin (COUMADIN) 5 MG tablet TAKE 1 TO 1 AND 1/2 TABLETS DAILY AS DIRECTED BY COUMADIN CLINIC 135 tablet 0   No current facility-administered medications on file prior to visit.     Observations/Objective: Alert, NAD, appropriate mood and affect, resps normal, cn 2-12 intact, moves all 4s, no visible rash or swelling Lab Results  Component Value Date   WBC 8.7 06/07/2018   HGB 11.4 (L) 06/07/2018   HCT 32.7 (L) 06/07/2018   PLT 166 06/07/2018   GLUCOSE 185 (H) 06/07/2018   CHOL 98 03/07/2018   TRIG 125.0 03/07/2018   HDL 25.30 (L) 03/07/2018   LDLDIRECT 88.0 02/28/2017   LDLCALC 48 03/07/2018   ALT 13 03/07/2018   AST 20 03/07/2018   NA 138 06/07/2018   K 4.6 06/07/2018   CL 104 06/07/2018   CREATININE 1.19  06/07/2018   BUN 12 06/07/2018   CO2 26 06/07/2018   TSH 3.69 08/31/2017   PSA 0.97 08/31/2017   INR 3.1 (A) 08/27/2018   HGBA1C 7.0 (H) 06/07/2018   MICROALBUR 3.6 (H) 08/31/2017   Assessment and Plan: See notes  Follow Up Instructions: See notes   I discussed the assessment and treatment plan with the patient. The patient was provided an opportunity to ask questions and all were answered. The patient agreed with the plan and demonstrated an understanding of the instructions.   The patient was advised to call back or seek an in-person evaluation if the symptoms worsen or if the condition fails to improve as anticipated.  Cathlean Cower, MD

## 2018-09-28 NOTE — Patient Instructions (Signed)
Please continue all other medications as before, and refills have been done if requested.  Please have the pharmacy call with any other refills you may need.  Please continue your efforts at being more active, low cholesterol diet, and weight control.  You are otherwise up to date with prevention measures today.  Please keep your appointments with your specialists as you may have planned  You will be contacted regarding the referral for: Dr Katy Fitch for yearly eye exam  Please go to the LAB in the Basement (turn left off the elevator) for the tests to be done Monday May 11 as you requested  You will be contacted by phone if any changes need to be made immediately.  Otherwise, you will receive a letter about your results with an explanation, but please check with MyChart first.  Please remember to sign up for MyChart if you have not done so, as this will be important to you in the future with finding out test results, communicating by private email, and scheduling acute appointments online when needed.  Please return in 6 months, or sooner if needed, with Lab testing done 3-5 days before

## 2018-09-29 ENCOUNTER — Encounter: Payer: Self-pay | Admitting: Internal Medicine

## 2018-09-29 NOTE — Assessment & Plan Note (Signed)

## 2018-09-29 NOTE — Assessment & Plan Note (Signed)
stable overall by history and exam, recent data reviewed with pt, and pt to continue medical treatment as before,  to f/u any worsening symptoms or concerns  

## 2018-10-02 ENCOUNTER — Other Ambulatory Visit (INDEPENDENT_AMBULATORY_CARE_PROVIDER_SITE_OTHER): Payer: Medicare HMO

## 2018-10-02 DIAGNOSIS — Z125 Encounter for screening for malignant neoplasm of prostate: Secondary | ICD-10-CM | POA: Diagnosis not present

## 2018-10-02 DIAGNOSIS — Z0000 Encounter for general adult medical examination without abnormal findings: Secondary | ICD-10-CM | POA: Diagnosis not present

## 2018-10-02 DIAGNOSIS — E119 Type 2 diabetes mellitus without complications: Secondary | ICD-10-CM | POA: Diagnosis not present

## 2018-10-02 LAB — CBC WITH DIFFERENTIAL/PLATELET
Basophils Absolute: 0 10*3/uL (ref 0.0–0.1)
Basophils Relative: 0.6 % (ref 0.0–3.0)
Eosinophils Absolute: 0.3 10*3/uL (ref 0.0–0.7)
Eosinophils Relative: 4.9 % (ref 0.0–5.0)
HCT: 37.8 % — ABNORMAL LOW (ref 39.0–52.0)
Hemoglobin: 13.2 g/dL (ref 13.0–17.0)
Lymphocytes Relative: 22.6 % (ref 12.0–46.0)
Lymphs Abs: 1.3 10*3/uL (ref 0.7–4.0)
MCHC: 34.9 g/dL (ref 30.0–36.0)
MCV: 83.2 fl (ref 78.0–100.0)
Monocytes Absolute: 0.7 10*3/uL (ref 0.1–1.0)
Monocytes Relative: 12.3 % — ABNORMAL HIGH (ref 3.0–12.0)
Neutro Abs: 3.5 10*3/uL (ref 1.4–7.7)
Neutrophils Relative %: 59.6 % (ref 43.0–77.0)
Platelets: 163 10*3/uL (ref 150.0–400.0)
RBC: 4.55 Mil/uL (ref 4.22–5.81)
RDW: 16.6 % — ABNORMAL HIGH (ref 11.5–15.5)
WBC: 5.8 10*3/uL (ref 4.0–10.5)

## 2018-10-02 LAB — BASIC METABOLIC PANEL
BUN: 12 mg/dL (ref 6–23)
CO2: 29 mEq/L (ref 19–32)
Calcium: 8.8 mg/dL (ref 8.4–10.5)
Chloride: 103 mEq/L (ref 96–112)
Creatinine, Ser: 1.06 mg/dL (ref 0.40–1.50)
GFR: 88.21 mL/min (ref >60.00)
Glucose, Bld: 140 mg/dL — ABNORMAL HIGH (ref 70–99)
Potassium: 3.7 mEq/L (ref 3.5–5.1)
Sodium: 139 mEq/L (ref 135–145)

## 2018-10-02 LAB — HEPATIC FUNCTION PANEL
ALT: 10 U/L (ref 0–53)
AST: 21 U/L (ref 0–37)
Albumin: 4.1 g/dL (ref 3.5–5.2)
Alkaline Phosphatase: 77 U/L (ref 39–117)
Bilirubin, Direct: 0.1 mg/dL (ref 0.0–0.3)
Total Bilirubin: 0.8 mg/dL (ref 0.2–1.2)
Total Protein: 7.7 g/dL (ref 6.0–8.3)

## 2018-10-02 LAB — HEMOGLOBIN A1C: Hgb A1c MFr Bld: 6.9 % — ABNORMAL HIGH (ref 4.6–6.5)

## 2018-10-02 LAB — LIPID PANEL
Cholesterol: 121 mg/dL (ref 0–200)
HDL: 27.6 mg/dL — ABNORMAL LOW (ref >39.00)
NonHDL: 93.36
Total CHOL/HDL Ratio: 4
Triglycerides: 224 mg/dL — ABNORMAL HIGH (ref 0.0–149.0)
VLDL: 44.8 mg/dL — ABNORMAL HIGH (ref 0.0–40.0)

## 2018-10-02 LAB — PSA: PSA: 1.04 ng/mL (ref 0.10–4.00)

## 2018-10-03 LAB — LDL CHOLESTEROL, DIRECT: Direct LDL: 50 mg/dL

## 2018-10-03 LAB — TSH: TSH: 4.19 u[IU]/mL (ref 0.35–4.50)

## 2018-10-05 ENCOUNTER — Telehealth: Payer: Self-pay

## 2018-10-05 NOTE — Telephone Encounter (Signed)
LMOM FOR PRESCREEN  

## 2018-10-05 NOTE — Telephone Encounter (Signed)

## 2018-10-08 NOTE — Telephone Encounter (Signed)

## 2018-10-10 ENCOUNTER — Other Ambulatory Visit: Payer: Self-pay

## 2018-10-10 ENCOUNTER — Ambulatory Visit (INDEPENDENT_AMBULATORY_CARE_PROVIDER_SITE_OTHER): Payer: Medicare HMO | Admitting: Pharmacist

## 2018-10-10 DIAGNOSIS — Z5181 Encounter for therapeutic drug level monitoring: Secondary | ICD-10-CM | POA: Diagnosis not present

## 2018-10-10 DIAGNOSIS — I480 Paroxysmal atrial fibrillation: Secondary | ICD-10-CM | POA: Diagnosis not present

## 2018-10-10 LAB — POCT INR: INR: 1.7 — AB (ref 2.0–3.0)

## 2018-10-17 ENCOUNTER — Telehealth: Payer: Self-pay | Admitting: Pharmacist

## 2018-10-17 NOTE — Telephone Encounter (Signed)
LMOM to r/s Coumadin appt to available appt time

## 2018-10-18 ENCOUNTER — Telehealth: Payer: Self-pay | Admitting: *Deleted

## 2018-10-18 NOTE — Telephone Encounter (Addendum)
1. COVID-19 Pre-Screening Questions:  . In the past 7 to 10 days have you had a cough,  shortness of breath, headache, congestion, fever (100 or greater) body aches, chills, sore throat, or sudden loss of taste or sense of smell? No . Have you been around anyone with known Covid 19? No . Have you been around anyone who is awaiting Covid 19 test results in the past 7 to 10 days? No . Have you been around anyone who has been exposed to Covid 19, or has mentioned symptoms of Covid 19 within the past 7 to 10 days? No  Pt aware that appt is inside the building.  2. Pt advised of visitor restrictions (no visitors allowed except if needed to conduct the visit). Also advised to arrive at appointment time and wear a mask.

## 2018-10-22 ENCOUNTER — Other Ambulatory Visit: Payer: Self-pay

## 2018-10-22 ENCOUNTER — Ambulatory Visit (INDEPENDENT_AMBULATORY_CARE_PROVIDER_SITE_OTHER): Payer: Medicare HMO | Admitting: *Deleted

## 2018-10-22 DIAGNOSIS — I480 Paroxysmal atrial fibrillation: Secondary | ICD-10-CM

## 2018-10-22 DIAGNOSIS — Z5181 Encounter for therapeutic drug level monitoring: Secondary | ICD-10-CM

## 2018-10-22 LAB — POCT INR: INR: 2.5 (ref 2.0–3.0)

## 2018-10-22 NOTE — Patient Instructions (Signed)
Description   Continue on same dosage 1 tablet daily except 1.5 tablets on Tuesdays, Thursdays, and Saturdays.  Next INR in 4 weeks.  Call us with any new medications or concerns [phone] [facility] [phone].

## 2018-11-12 ENCOUNTER — Telehealth: Payer: Self-pay

## 2018-11-12 NOTE — Telephone Encounter (Signed)
lmom for prescreen  

## 2018-11-15 ENCOUNTER — Other Ambulatory Visit: Payer: Self-pay | Admitting: Internal Medicine

## 2018-11-19 ENCOUNTER — Ambulatory Visit (INDEPENDENT_AMBULATORY_CARE_PROVIDER_SITE_OTHER): Payer: Medicare HMO | Admitting: *Deleted

## 2018-11-19 ENCOUNTER — Other Ambulatory Visit: Payer: Self-pay

## 2018-11-19 DIAGNOSIS — I480 Paroxysmal atrial fibrillation: Secondary | ICD-10-CM

## 2018-11-19 DIAGNOSIS — Z5181 Encounter for therapeutic drug level monitoring: Secondary | ICD-10-CM | POA: Diagnosis not present

## 2018-11-19 LAB — POCT INR: INR: 2.3 (ref 2.0–3.0)

## 2018-11-19 NOTE — Patient Instructions (Signed)
Description   Today take 1.5 tablets then continue on same dosage 1 tablet daily except 1.5 tablets on Tuesdays, Thursdays, and Saturdays.  Next INR in 4 weeks.  Call us with any new medications or concerns [phone] [facility] [phone].

## 2018-12-13 ENCOUNTER — Telehealth: Payer: Self-pay | Admitting: *Deleted

## 2018-12-13 NOTE — Telephone Encounter (Signed)
LMOVM to call back for Prescreen Covid Questions.

## 2018-12-17 ENCOUNTER — Other Ambulatory Visit: Payer: Self-pay

## 2018-12-17 ENCOUNTER — Ambulatory Visit (INDEPENDENT_AMBULATORY_CARE_PROVIDER_SITE_OTHER): Payer: Medicare HMO

## 2018-12-17 DIAGNOSIS — I480 Paroxysmal atrial fibrillation: Secondary | ICD-10-CM | POA: Diagnosis not present

## 2018-12-17 DIAGNOSIS — Z5181 Encounter for therapeutic drug level monitoring: Secondary | ICD-10-CM

## 2018-12-17 LAB — POCT INR: INR: 3.1 — AB (ref 2.0–3.0)

## 2018-12-17 NOTE — Patient Instructions (Signed)
Description   Continue on same dosage 1 tablet daily except 1.5 tablets on Tuesdays, Thursdays, and Saturdays.  Next INR in 4 weeks.  Call us with any new medications or concerns [phone] [facility] [phone].

## 2018-12-28 ENCOUNTER — Other Ambulatory Visit: Payer: Self-pay

## 2018-12-28 ENCOUNTER — Other Ambulatory Visit: Payer: Self-pay | Admitting: Cardiology

## 2019-01-01 ENCOUNTER — Other Ambulatory Visit: Payer: Self-pay | Admitting: Internal Medicine

## 2019-01-08 ENCOUNTER — Other Ambulatory Visit: Payer: Self-pay

## 2019-01-08 ENCOUNTER — Emergency Department (HOSPITAL_COMMUNITY)
Admission: EM | Admit: 2019-01-08 | Discharge: 2019-01-08 | Discharge status: Against Medical Advice | Payer: Medicare HMO

## 2019-01-08 NOTE — ED Notes (Signed)
Called three times, no response.

## 2019-01-09 ENCOUNTER — Encounter (HOSPITAL_COMMUNITY): Payer: Self-pay | Admitting: Urgent Care

## 2019-01-09 ENCOUNTER — Other Ambulatory Visit: Payer: Self-pay

## 2019-01-09 ENCOUNTER — Ambulatory Visit (HOSPITAL_COMMUNITY)
Admission: EM | Admit: 2019-01-09 | Discharge: 2019-01-09 | Disposition: A | Discharge status: Home / Self Care | Payer: Medicare HMO | Attending: Urgent Care | Admitting: Urgent Care

## 2019-01-09 DIAGNOSIS — M545 Low back pain, unspecified: Secondary | ICD-10-CM

## 2019-01-09 DIAGNOSIS — M5380 Other specified dorsopathies, site unspecified: Secondary | ICD-10-CM

## 2019-01-09 DIAGNOSIS — Z9889 Other specified postprocedural states: Secondary | ICD-10-CM

## 2019-01-09 MED ORDER — TIZANIDINE HCL 4 MG PO TABS: 4.0000 mg | ORAL_TABLET | Freq: Three times a day (TID) | ORAL | 0 refills | Status: DC | PRN

## 2019-01-09 MED ORDER — GABAPENTIN 300 MG PO CAPS: 300.0000 mg | ORAL_CAPSULE | Freq: Three times a day (TID) | ORAL | 0 refills | Status: DC

## 2019-01-09 NOTE — ED Triage Notes (Signed)
Patient reports back surgery 2 months ago  Patient reports back pain that feels like a pulled muscle on right side, lower back.

## 2019-01-09 NOTE — ED Provider Notes (Signed)
MRN: [id number] DOB: 1966-01-29  Subjective:   [name] is a 53 y.o. male presenting for several day history of recurrent back tightness, feels like he pulled a muscle in his right side of his lower back. Feels tightness of his back but insists that there is no pain.  Of note, patient had a laminectomy in January 2020.  He took gabapentin and Flexeril for a while and states that it helped.  Denies any kind of radicular symptoms, numbing, tingling, difficulty with his bowel movements and urinating.  Has a history of renal stones, denies any kind of pain similar to that from his history.  He has not tried any medications currently for his back.  Denies any particular inciting event.  No current facility-administered medications for this encounter.   Current Outpatient Medications:  .  amLODipine (NORVASC) 10 MG tablet, TAKE 1 TABLET(10 MG) BY MOUTH DAILY (Patient taking differently: Take 10 mg by mouth daily. ), Disp: 90 tablet, Rfl: 1 .  atorvastatin (LIPITOR) 40 MG tablet, TAKE 1 TABLET EVERY DAY  AT  6  PM, Disp: 90 tablet, Rfl: 1 .  cholecalciferol (VITAMIN D) 1000 UNITS tablet, Take 1,000 Units by mouth daily., Disp: , Rfl:  .  clopidogrel (PLAVIX) 75 MG tablet, Take 1 tablet (75 mg total) by mouth daily., Disp: 90 tablet, Rfl: 3 .  furosemide (LASIX) 40 MG tablet, Take 1 tablet (40 mg total) by mouth 2 (two) times daily., Disp: 180 tablet, Rfl: 3 .  gabapentin (NEURONTIN) 300 MG capsule, Take 1 capsule (300 mg total) by mouth 3 (three) times daily., Disp: 90 capsule, Rfl: 3 .  glimepiride (AMARYL) 1 MG tablet, TAKE 3 TABLETS ONE TIME DAILY WITH BREAKFAST (Patient taking differently: Take 3 mg by mouth daily with breakfast. ), Disp: 270 tablet, Rfl: 3 .  levothyroxine (SYNTHROID, LEVOTHROID) 50 MCG tablet, TAKE 1 TABLET EVERY DAY, Disp: 90 tablet, Rfl: 1 .  losartan (COZAAR) 100 MG tablet, TAKE 1 TABLET EVERY DAY, Disp: 90 tablet, Rfl: 1 .  metFORMIN (GLUCOPHAGE) 1000 MG tablet, Take 1  tablet (1,000 mg total) by mouth 2 (two) times daily with a meal., Disp: 180 tablet, Rfl: 3 .  metoprolol (TOPROL-XL) 200 MG 24 hr tablet, Take 1 tablet (200 mg total) by mouth daily. Take with or immediately following a meal., Disp: 90 tablet, Rfl: 3 .  nitroGLYCERIN (NITROSTAT) 0.4 MG SL tablet, Place 1 tablet (0.4 mg total) under the tongue every 5 (five) minutes x 3 doses as needed for chest pain., Disp: 25 tablet, Rfl: 2 .  ondansetron (ZOFRAN) 4 MG tablet, Take 1 tablet (4 mg total) by mouth every 8 (eight) hours as needed for nausea or vomiting., Disp: 20 tablet, Rfl: 0 .  sildenafil (REVATIO) 20 MG tablet, TAKE 2-5 TABLETS BY MOUTH 30 MINUTES PRIOR TO INTERCOURSE, Disp: 30 tablet, Rfl: 0 .  warfarin (COUMADIN) 5 MG tablet, TAKE 1 TO 1 AND 1/2 TABLETS DAILY AS DIRECTED BY COUMADIN CLINIC, Disp: 135 tablet, Rfl: 0    Allergies  Allergen Reactions  . Diltiazem Hives  . Insulin Glargine Swelling    edema    Past Medical History:  Diagnosis Date  . Ascending aortic dissection ([place])    a. 2000 - with aortic valve involvement. S/p repair of aortic dissection with placement of mechanical AVR.  [name] CAD (coronary artery disease)    a. 2000 VG->PDA @ time of Ao dissection repair;  b. Cardiac CT 06/2010: no CAD, only mild plaque in prox RCA;  c. 2015 Cath: LM nl, LAD nl, LCX nl, RCA 100, VG->PDA 90 (4.5x12 Rebel BMS). d. 10/23/17 instent restenosis SVG to RCA, DES placed.  . Cholelithiasis 08/22/2013  . Chronic diastolic CHF (congestive heart failure) ([place])    a. 03/2016 Echo: EF 55-60%, no rwma, Gr2 DD.  . Diabetes mellitus   . Dyspnea   . Gross hematuria   . H/O mechanical aortic valve replacement    a. 03/2016 Echo: AoV mean gradient 54mmHg, Valve Area (VTI) 1.36 cm^2, (Vmax) 1.22 cm^2, mod dil LA.  [name] Hyperlipidemia   . Hypertension   . NEPHROLITHIASIS, HX OF   . Obesity   . PAF (paroxysmal atrial fibrillation) ([place])    a. H/o such, with recurrence of coarse afib/flutter during ER consult  03/2012.  . Stroke ([place])   . TRANSIENT ISCHEMIC ATTACK, HX OF    a. In 2004.  [name] Unspecified vitamin D deficiency      Past Surgical History:  Procedure Laterality Date  . AORTIC VALVE REPLACEMENT  2000  . APPENDECTOMY  1998  . CORONARY ARTERY BYPASS GRAFT  2000  . CORONARY STENT INTERVENTION N/A 10/23/2017   Procedure: CORONARY STENT INTERVENTION;  Surgeon: [name], MD;  Location: [place] [facility] LAB;  Service: Cardiovascular;  Laterality: N/A;  . LEFT AND RIGHT HEART CATHETERIZATION WITH CORONARY ANGIOGRAM N/A 07/19/2013   Procedure: LEFT AND RIGHT HEART CATHETERIZATION WITH CORONARY ANGIOGRAM;  Surgeon: [name], MD;  Location: [facility] CATH LAB;  Service: Cardiovascular;  Laterality: N/A;  . LUMBAR LAMINECTOMY/DECOMPRESSION MICRODISCECTOMY Right 06/06/2018   Procedure: Right L3-4 disectomy;  Surgeon: Melina Schools, MD;  Location: [place];  Service: Orthopedics;  Laterality: Right;  2.5 hrs  . RIGHT HEART CATH AND CORONARY/GRAFT ANGIOGRAPHY N/A 10/23/2017   Procedure: RIGHT HEART CATH AND CORONARY/GRAFT ANGIOGRAPHY;  Surgeon: [name], MD;  Location: [place] CV LAB;  Service: Cardiovascular;  Laterality: N/A;  . TEE WITHOUT CARDIOVERSION N/A 07/22/2013   Procedure: TRANSESOPHAGEAL ECHOCARDIOGRAM (TEE);  Surgeon: [name], MD;  Location: [facility] ENDOSCOPY;  Service: Cardiovascular;  Laterality: N/A;    ROS  Objective:   Vitals: BP 139/82 (BP Location: Left Arm) Comment (BP Location): large cuff  Pulse 70   Temp (!) 97.5 F (36.4 C) (Oral)   Resp 20   SpO2 100%   Physical Exam Constitutional:      Appearance: Normal appearance. He is well-developed and normal weight.  HENT:     Head: Normocephalic and atraumatic.     Right Ear: External ear normal.     Left Ear: External ear normal.     Nose: Nose normal.     Mouth/Throat:     Pharynx: Oropharynx is clear.  Eyes:     Extraocular Movements: Extraocular movements intact.     Pupils: Pupils are  equal, round, and reactive to light.  Cardiovascular:     Rate and Rhythm: Normal rate.  Pulmonary:     Effort: Pulmonary effort is normal.  Musculoskeletal:     Lumbar back: He exhibits no tenderness, no bony tenderness, no swelling, no edema, no deformity and no spasm. Decreased range of motion: Slight decrease in flexion and extension.       Back:     Comments: Strength 5 out of 5 for his thighs and lower legs.  Skin:    General: Skin is warm and dry.  Neurological:     Mental Status: He is alert and oriented to person, place, and time.  Deep Tendon Reflexes: Reflexes normal.  Psychiatric:        Mood and Affect: Mood normal.        Behavior: Behavior normal.        Thought Content: Thought content normal.        Judgment: Judgment normal.     Assessment and Plan :   1. Acute bilateral low back pain without sciatica   2. Back tightness   3. History of back surgery     We will have patient restart his gabapentin and will use tizanidine instead of Flexeril as patient reports that he does not think it helped in the past.  Physical exam findings reassuring as the strength is 5 out of 5, has good range of motion and normal deep tendon reflexes.  Recommended patient reach out to his spinal surgeon should his symptoms persist or if he develops recurrence of radiculopathy. Counseled patient on potential for adverse effects with medications prescribed/recommended today, ER and return-to-clinic precautions discussed, patient verbalized understanding.    Jaynee Eagles, [place] 01/09/19 [phone]

## 2019-01-10 ENCOUNTER — Other Ambulatory Visit: Payer: Self-pay | Admitting: Cardiology

## 2019-01-16 ENCOUNTER — Ambulatory Visit (INDEPENDENT_AMBULATORY_CARE_PROVIDER_SITE_OTHER): Payer: Medicare HMO | Admitting: *Deleted

## 2019-01-16 ENCOUNTER — Other Ambulatory Visit: Payer: Self-pay

## 2019-01-16 DIAGNOSIS — I480 Paroxysmal atrial fibrillation: Secondary | ICD-10-CM | POA: Diagnosis not present

## 2019-01-16 DIAGNOSIS — Z5181 Encounter for therapeutic drug level monitoring: Secondary | ICD-10-CM | POA: Diagnosis not present

## 2019-01-16 LAB — POCT INR: INR: 1.7 — AB (ref 2.0–3.0)

## 2019-01-16 NOTE — Patient Instructions (Signed)
Description   Today take 1.5 tablets and tomorrow take 2 tablets then continue on same dosage 1 tablet daily except 1.5 tablets on Tuesdays, Thursdays, and Saturdays.  Next INR in 2 weeks.  Call us with any new medications or concerns [phone] [facility], [facility] [phone].

## 2019-01-18 ENCOUNTER — Telehealth: Payer: Self-pay

## 2019-01-18 NOTE — Telephone Encounter (Signed)
Left a message to return call.  

## 2019-01-18 NOTE — Telephone Encounter (Signed)
-----   Message from Doran Stabler, MD sent at 01/17/2019 12:37 PM EDT ----- Has been 3 years since last seen - please make office appt with me or APP to discuss screening.  - HD ----- Message ----- From: Elias Else, CMA Sent: 01/17/2019  11:45 AM EDT To: Doran Stabler, MD   ----- Message ----- From: Elias Else, CMA Sent: 01/15/2019   2:27 PM EDT To: Doran Stabler, MD  Would you like me to order the cologuard or notify his PCP that he's due?  Vivien Rota, CMA ----- Message ----- From: Jonhatan Lasso, RN Sent: 01/15/2019 To: Elias Else, CMA  Pt needs cologuard in 3 years see 01/15/16 result note

## 2019-01-18 NOTE — Telephone Encounter (Signed)
Patient has been scheduled for a follow up to discuss a colonoscopy.

## 2019-01-19 ENCOUNTER — Emergency Department (HOSPITAL_COMMUNITY)
Admission: EM | Admit: 2019-01-19 | Discharge: 2019-01-19 | Disposition: A | Discharge status: Home / Self Care | Payer: Medicare HMO | Attending: Emergency Medicine | Admitting: Emergency Medicine

## 2019-01-19 ENCOUNTER — Emergency Department (HOSPITAL_COMMUNITY): Payer: Medicare HMO

## 2019-01-19 ENCOUNTER — Other Ambulatory Visit: Payer: Self-pay

## 2019-01-19 ENCOUNTER — Encounter (HOSPITAL_COMMUNITY): Payer: Self-pay

## 2019-01-19 DIAGNOSIS — R2243 Localized swelling, mass and lump, lower limb, bilateral: Secondary | ICD-10-CM | POA: Diagnosis not present

## 2019-01-19 DIAGNOSIS — E119 Type 2 diabetes mellitus without complications: Secondary | ICD-10-CM | POA: Insufficient documentation

## 2019-01-19 DIAGNOSIS — M7989 Other specified soft tissue disorders: Secondary | ICD-10-CM | POA: Diagnosis not present

## 2019-01-19 DIAGNOSIS — Z79899 Other long term (current) drug therapy: Secondary | ICD-10-CM | POA: Insufficient documentation

## 2019-01-19 DIAGNOSIS — M545 Low back pain, unspecified: Secondary | ICD-10-CM

## 2019-01-19 DIAGNOSIS — Z7984 Long term (current) use of oral hypoglycemic drugs: Secondary | ICD-10-CM | POA: Diagnosis not present

## 2019-01-19 DIAGNOSIS — I2510 Atherosclerotic heart disease of native coronary artery without angina pectoris: Secondary | ICD-10-CM | POA: Diagnosis not present

## 2019-01-19 DIAGNOSIS — Z7901 Long term (current) use of anticoagulants: Secondary | ICD-10-CM | POA: Diagnosis not present

## 2019-01-19 DIAGNOSIS — I110 Hypertensive heart disease with heart failure: Secondary | ICD-10-CM | POA: Insufficient documentation

## 2019-01-19 DIAGNOSIS — I5022 Chronic systolic (congestive) heart failure: Secondary | ICD-10-CM | POA: Insufficient documentation

## 2019-01-19 LAB — CBC
HCT: 33.8 % — ABNORMAL LOW (ref 39.0–52.0)
Hemoglobin: 11.8 g/dL — ABNORMAL LOW (ref 13.0–17.0)
MCH: 28.8 pg (ref 26.0–34.0)
MCHC: 34.9 g/dL (ref 30.0–36.0)
MCV: 82.4 fL (ref 80.0–100.0)
Platelets: 139 10*3/uL — ABNORMAL LOW (ref 150–400)
RBC: 4.1 MIL/uL — ABNORMAL LOW (ref 4.22–5.81)
RDW: 14.7 % (ref 11.5–15.5)
WBC: 4.6 10*3/uL (ref 4.0–10.5)
nRBC: 0 % (ref 0.0–0.2)

## 2019-01-19 LAB — BASIC METABOLIC PANEL
Anion gap: 7 (ref 5–15)
BUN: 12 mg/dL (ref 6–20)
CO2: 24 mmol/L (ref 22–32)
Calcium: 8.8 mg/dL — ABNORMAL LOW (ref 8.9–10.3)
Chloride: 108 mmol/L (ref 98–111)
Creatinine, Ser: 1.13 mg/dL (ref 0.61–1.24)
GFR calc Af Amer: >60 mL/min (ref >60)
GFR calc non Af Amer: >60 mL/min (ref >60)
Glucose, Bld: 190 mg/dL — ABNORMAL HIGH (ref 70–99)
Potassium: 3.7 mmol/L (ref 3.5–5.1)
Sodium: 139 mmol/L (ref 135–145)

## 2019-01-19 IMAGING — CR LUMBAR SPINE - COMPLETE 4+ VIEW
5 series · 5 of 5 positions shown · non-contrast
Comparison: MRI of the lumbar spine on 04/24/2018

CLINICAL DATA: Low back pain.  History L3-4 discectomy.

EXAM:
LUMBAR SPINE - COMPLETE 4+ VIEW

[l-spine ap]
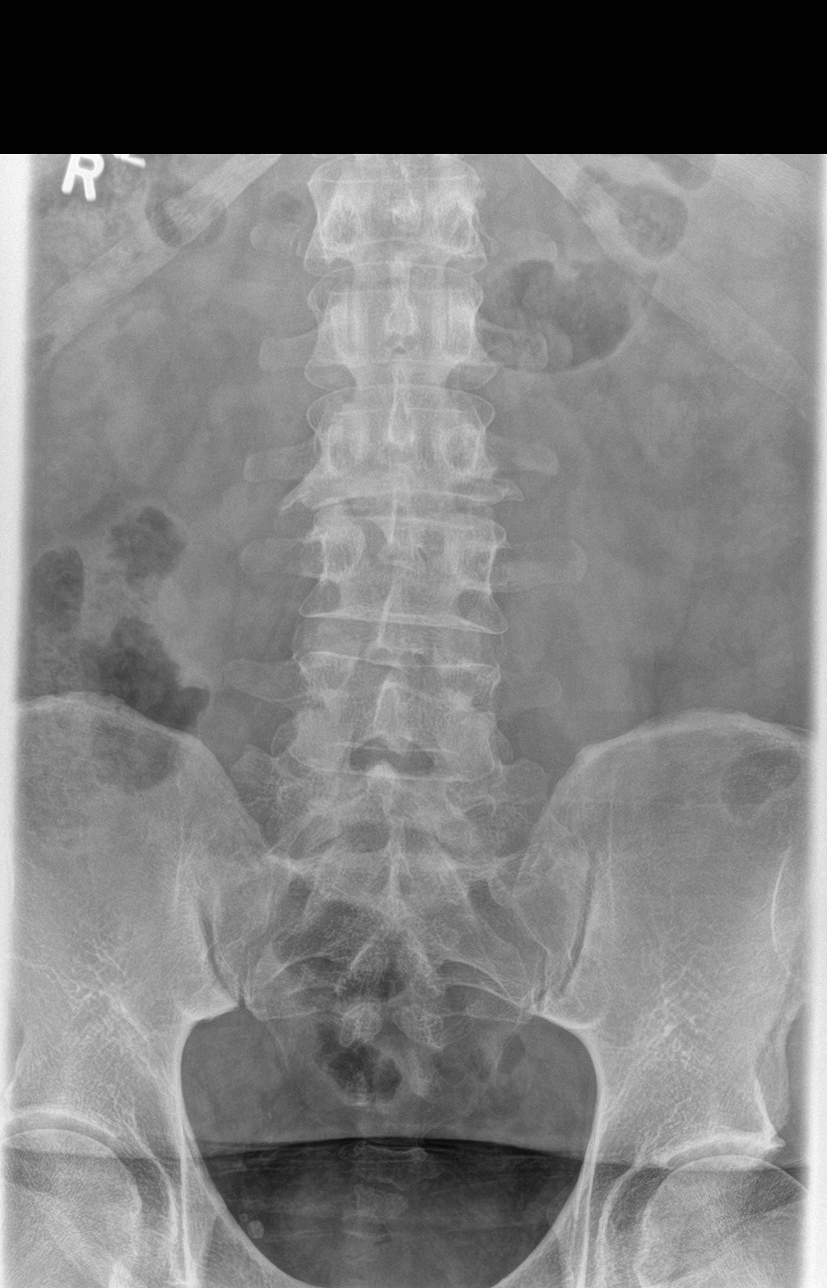

[l-spine obl (1 of 2)]
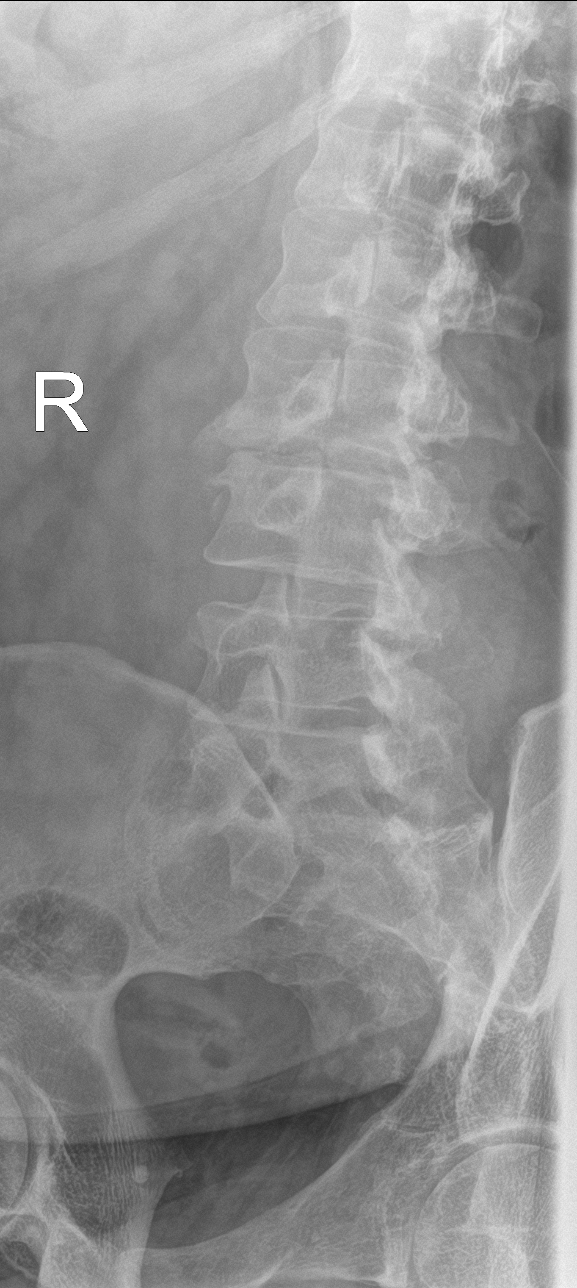

[l-spine obl (2 of 2)]
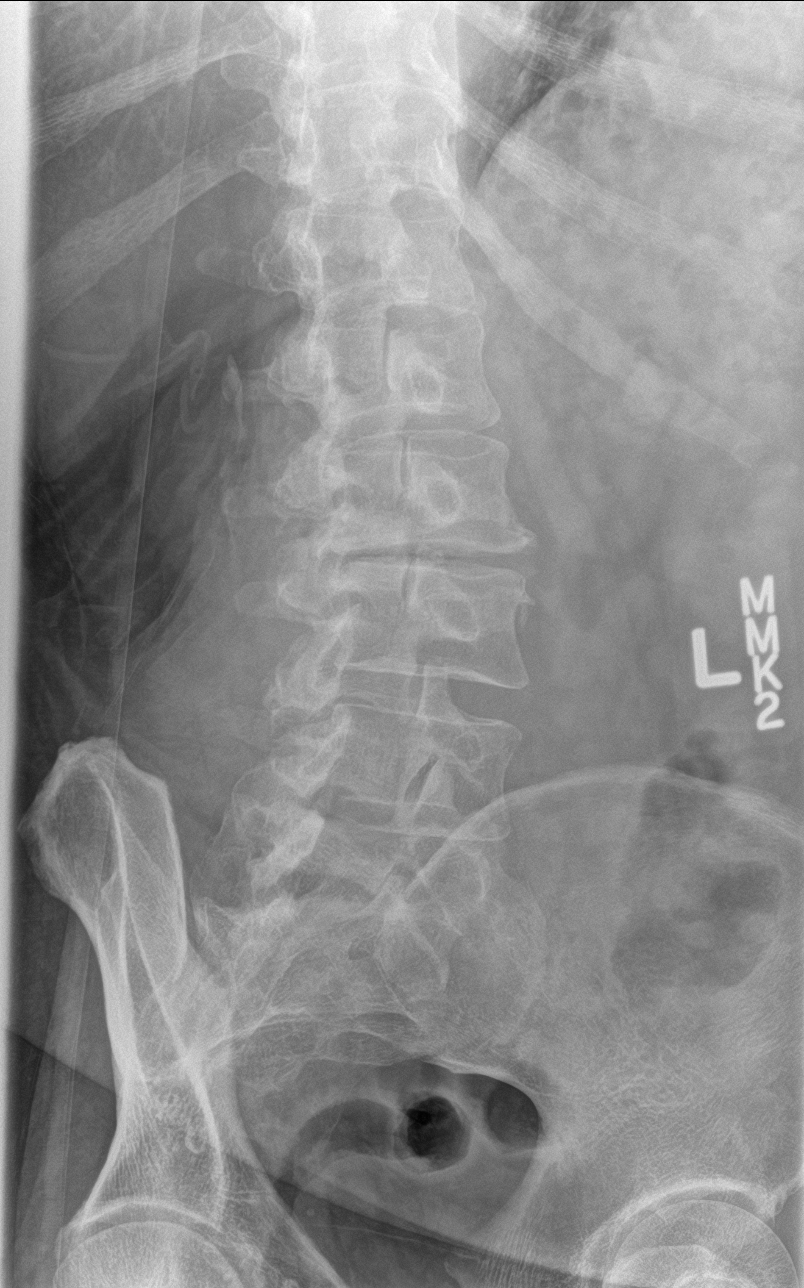

[l-spine lat]
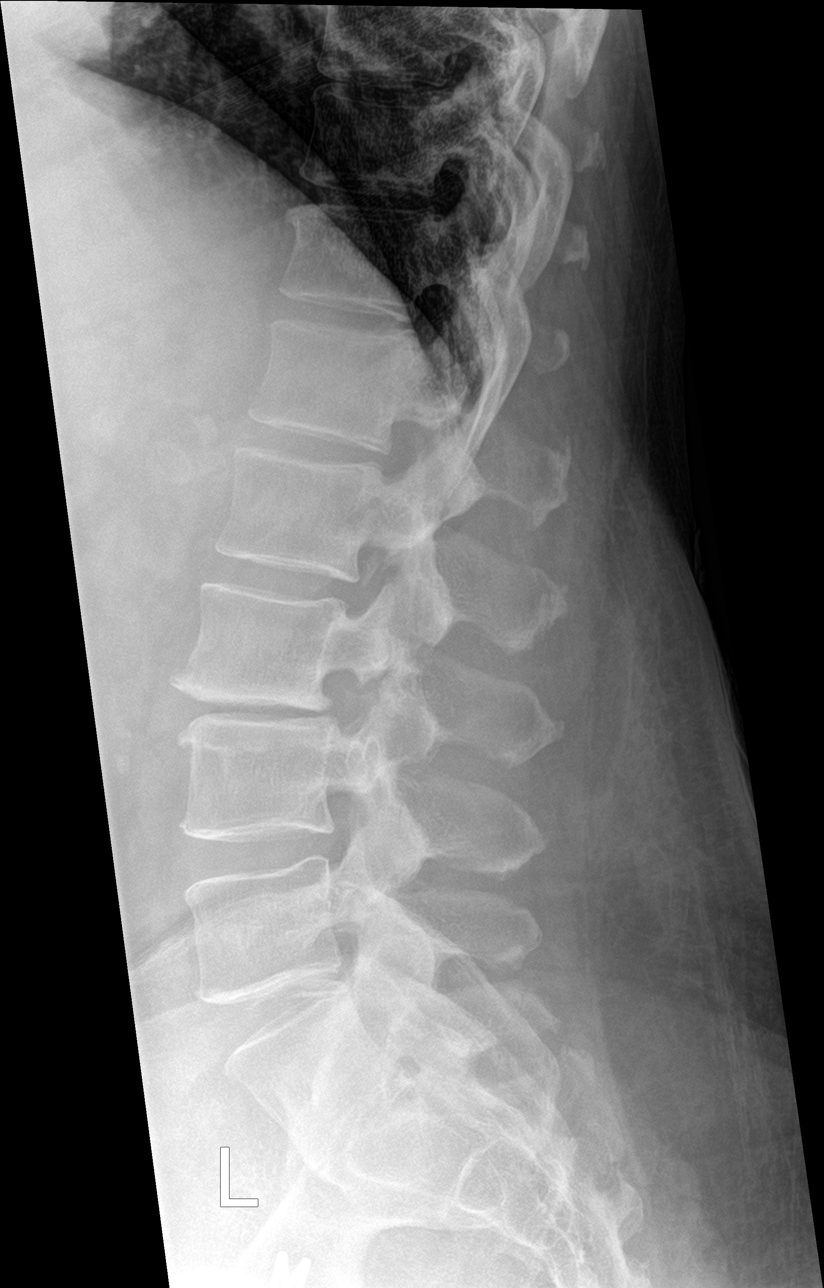

[l-spine spot]
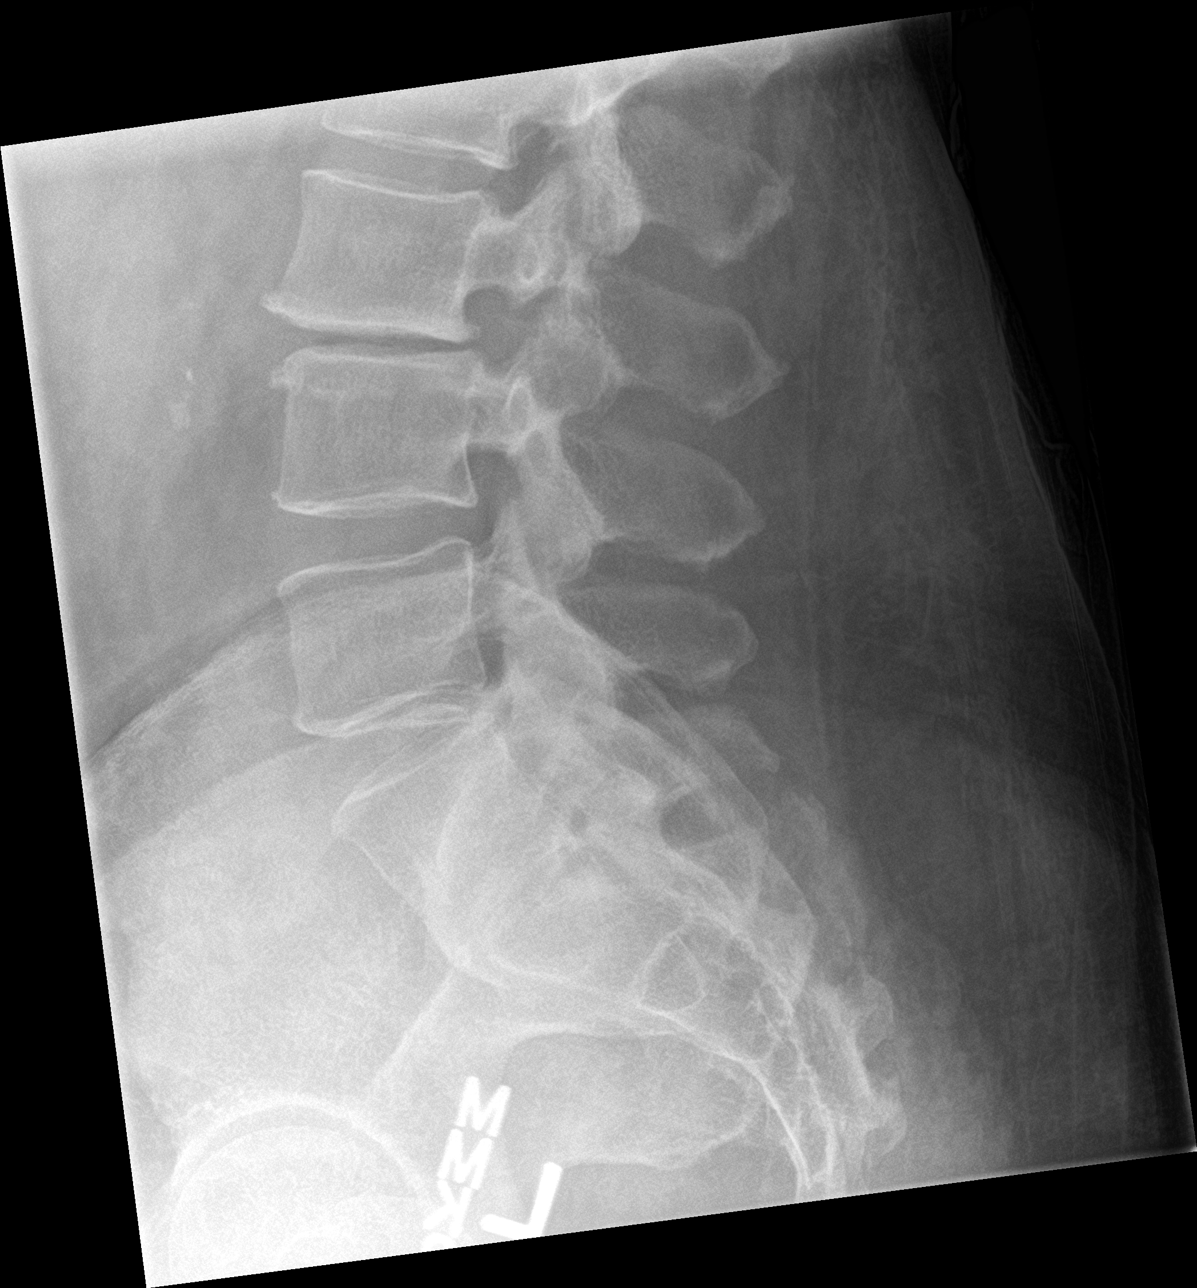

[5 of 5 positions shown; findings below may reference images not displayed]

FINDINGS: No fracture or subluxation identified. Moderate degenerative disc
disease at L3-4 with roughly 6 mm anterolisthesis L3 on L4. The S1
level is partially transitional. No bony lesions identified.
IMPRESSION: Moderate degenerative disc disease at L3-4 with mild anterolisthesis
of L3 on L4.

## 2019-01-19 MED ORDER — LIDOCAINE 5 % EX PTCH
1.0000 | MEDICATED_PATCH | CUTANEOUS | Status: DC
  Administered 2019-01-19: 1 via TRANSDERMAL
  Filled 2019-01-19: qty 1

## 2019-01-19 MED ORDER — METHOCARBAMOL 500 MG PO TABS
500.0000 mg | ORAL_TABLET | Freq: Once | ORAL | Status: AC
  Administered 2019-01-19: 09:00:00 500 mg via ORAL
  Filled 2019-01-19: qty 1

## 2019-01-19 MED ORDER — METHOCARBAMOL 500 MG PO TABS: 500.0000 mg | ORAL_TABLET | Freq: Two times a day (BID) | ORAL | 0 refills | Status: DC

## 2019-01-19 NOTE — ED Notes (Signed)
Pt transported to XRay 

## 2019-01-19 NOTE — ED Provider Notes (Signed)
[facility] EMERGENCY DEPARTMENT Provider Note   CSN: [id number] Arrival date & time: 01/19/19  0803     History   Chief Complaint Chief Complaint  Patient presents with  . Back Pain    HPI [name] is a 53 y.o. male.     [name] is a 53 y.o. male with a history of CAD, CHF, ascending aortic dissection s/p repair with aortic mechanical valve, hypertension, hyperlipidemia, paroxysmal A. fib, kidney stones, diabetes and obesity, who presents to the emergency department for evaluation of right-sided low back pain.  He reports that in January he had an L3-4 decompression surgery performed by Dr. Rolena Infante with orthopedics, reports that overall he has been doing well since his surgery but over the past month he has had intermittent pains over his right lower back.  He reports it feels like a tightness, like his muscles are in a knot.  He denies any numbness weakness or tingling in his lower extremities, no loss of bowel or bladder control or saddle anesthesia.  He is unsure of any injury to the area.  Reports that he was seen at urgent care on the 19th, was prescribed gabapentin and tizanidine, but reports these medications do not seem to be helping.  He has not called his surgeon for a follow-up appointment yet.  He also reports that over the past 2 days he has had some increasing swelling in his lower extremities, reports that last night when he weighed himself he was 6 pounds up, this morning he was only 2 pounds up.  He has not had any chest pain or shortness of breath.  Lower extremity edema does seem to get a bit better when he wears his compression stockings or elevates his legs.  He does take Lasix, 20 mg twice daily, reports he has been compliant with this medication, has not called his cardiologist regarding the swelling.  Has not had any exertional dyspnea, orthopnea, no palpitations.     Past Medical History:  Diagnosis Date  . Ascending aortic  dissection ([place])    a. 2000 - with aortic valve involvement. S/p repair of aortic dissection with placement of mechanical AVR.  [name] CAD (coronary artery disease)    a. 2000 VG->PDA @ time of Ao dissection repair;  b. Cardiac CT 06/2010: no CAD, only mild plaque in prox RCA;  c. 2015 Cath: LM nl, LAD nl, LCX nl, RCA 100, VG->PDA 90 (4.5x12 Rebel BMS). d. 10/23/17 instent restenosis SVG to RCA, DES placed.  . Cholelithiasis 08/22/2013  . Chronic diastolic CHF (congestive heart failure) ([place])    a. 03/2016 Echo: EF 55-60%, no rwma, Gr2 DD.  . Diabetes mellitus   . Dyspnea   . Gross hematuria   . H/O mechanical aortic valve replacement    a. 03/2016 Echo: AoV mean gradient 7mmHg, Valve Area (VTI) 1.36 cm^2, (Vmax) 1.22 cm^2, mod dil LA.  [name] Hyperlipidemia   . Hypertension   . NEPHROLITHIASIS, HX OF   . Obesity   . PAF (paroxysmal atrial fibrillation) ([place])    a. H/o such, with recurrence of coarse afib/flutter during ER consult 03/2012.  . Stroke ([place])   . TRANSIENT ISCHEMIC ATTACK, HX OF    a. In 2004.  [name] Unspecified vitamin D deficiency     Patient Active Problem List   Diagnosis Date Noted  . Wellness examination 09/28/2018  . Lumbar disc herniation 06/06/2018  . Coronary artery disease involving native heart without angina pectoris   .  Low back pain 03/07/2018  . Obesity (BMI 30-39.9) 01/15/2018  . Acute on chronic systolic congestive heart failure ([place])   . Status post coronary artery stent placement   . Dyspnea on exertion 10/22/2017  . Anemia 08/31/2017  . Chronic diastolic CHF (congestive heart failure) ([place]) 03/30/2016  . Screen for colon cancer 09/03/2015  . PTSD (post-traumatic stress disorder) 10/31/2013  . Depressive disorder 10/31/2013  . Hallucinations 10/31/2013  . Cholelithiasis 08/22/2013  . Hypothyroidism 08/22/2013  . Encounter for therapeutic drug monitoring 08/07/2013  . Unstable angina ([place]) 07/25/2013  . Moderate to severe aortic stenosis- pt will need AVR  revision 07/24/2013  . CAD of artery bypass graft- SVG-RCA BMS 2/271/5 07/24/2013  . Hematoma of groin 07/24/2013  . Retroperitoneal bleed Nov 2014 07/24/2013  . Acute diastolic heart failure ([place]) 07/21/2013  . Ascending aortic dissection  in 2000- s/p [place] and MDT tilting disc AVR   . PAF (paroxysmal atrial fibrillation) ([place])   . Bladder neck obstruction 05/04/2012  . Unspecified vitamin D deficiency 08/07/2008  . Gross hematuria 08/07/2008  . FATIGUE 12/26/2007  . Hyperlipidemia 08/20/2007  . Diabetes mellitus type II, non insulin dependent ([place]) 05/21/2007  . Essential hypertension 05/21/2007  . NEPHROLITHIASIS, HX OF 05/21/2007    Past Surgical History:  Procedure Laterality Date  . AORTIC VALVE REPLACEMENT  2000  . APPENDECTOMY  1998  . CORONARY ARTERY BYPASS GRAFT  2000  . CORONARY STENT INTERVENTION N/A 10/23/2017   Procedure: CORONARY STENT INTERVENTION;  Surgeon: [name], MD;  Location: [facility] CV LAB;  Service: Cardiovascular;  Laterality: N/A;  . LEFT AND RIGHT HEART CATHETERIZATION WITH CORONARY ANGIOGRAM N/A 07/19/2013   Procedure: LEFT AND RIGHT HEART CATHETERIZATION WITH CORONARY ANGIOGRAM;  Surgeon: [name], MD;  Location: [facility] CATH LAB;  Service: Cardiovascular;  Laterality: N/A;  . LUMBAR LAMINECTOMY/DECOMPRESSION MICRODISCECTOMY Right 06/06/2018   Procedure: Right L3-4 disectomy;  Surgeon: Melina Schools, MD;  Location: [facility];  Service: Orthopedics;  Laterality: Right;  2.5 hrs  . RIGHT HEART CATH AND CORONARY/GRAFT ANGIOGRAPHY N/A 10/23/2017   Procedure: RIGHT HEART CATH AND CORONARY/GRAFT ANGIOGRAPHY;  Surgeon: [name], MD;  Location: [facility] CV LAB;  Service: Cardiovascular;  Laterality: N/A;  . TEE WITHOUT CARDIOVERSION N/A 07/22/2013   Procedure: TRANSESOPHAGEAL ECHOCARDIOGRAM (TEE);  Surgeon: [name], MD;  Location: [facility] ENDOSCOPY;  Service: Cardiovascular;  Laterality: N/A;        Home Medications    Prior  to Admission medications   Medication Sig Start Date End Date Taking? Authorizing Provider  amLODipine (NORVASC) 10 MG tablet TAKE 1 TABLET EVERY DAY 01/16/19   Lelon Perla, MD  atorvastatin (LIPITOR) 40 MG tablet TAKE 1 TABLET EVERY DAY  AT  6  PM 12/28/18   Erlene Quan, PA-C  cholecalciferol (VITAMIN D) 1000 UNITS tablet Take 1,000 Units by mouth daily.    [provider]  clopidogrel (PLAVIX) 75 MG tablet TAKE 1 TABLET EVERY DAY 01/16/19   Lelon Perla, MD  furosemide (LASIX) 40 MG tablet TAKE 1 TABLET TWICE DAILY 01/16/19   Lelon Perla, MD  gabapentin (NEURONTIN) 300 MG capsule Take 1 capsule (300 mg total) by mouth 3 (three) times daily. 01/09/19   Jaynee Eagles, PA-C  glimepiride (AMARYL) 1 MG tablet TAKE 3 TABLETS ONE TIME DAILY WITH BREAKFAST Patient taking differently: Take 3 mg by mouth daily with breakfast.  03/14/17   Biagio Borg, MD  levothyroxine (SYNTHROID, LEVOTHROID) 50 MCG tablet  TAKE 1 TABLET EVERY DAY 06/01/18   Biagio Borg, MD  losartan (COZAAR) 100 MG tablet TAKE 1 TABLET EVERY DAY 01/02/19   Biagio Borg, MD  metFORMIN (GLUCOPHAGE) 1000 MG tablet Take 1 tablet (1,000 mg total) by mouth 2 (two) times daily with a meal. 09/28/18   Biagio Borg, MD  metoprolol (TOPROL-XL) 200 MG 24 hr tablet Take 1 tablet (200 mg total) by mouth daily. Take with or immediately following a meal. 09/28/18   Biagio Borg, MD  nitroGLYCERIN (NITROSTAT) 0.4 MG SL tablet Place 1 tablet (0.4 mg total) under the tongue every 5 (five) minutes x 3 doses as needed for chest pain. 07/25/13   Erlene Quan, PA-C  ondansetron (ZOFRAN) 4 MG tablet Take 1 tablet (4 mg total) by mouth every 8 (eight) hours as needed for nausea or vomiting. 06/07/18   Melina Schools, MD  sildenafil (REVATIO) 20 MG tablet TAKE 2-5 TABLETS BY MOUTH 30 MINUTES PRIOR TO INTERCOURSE 11/15/18   Biagio Borg, MD  tiZANidine (ZANAFLEX) 4 MG tablet Take 1 tablet (4 mg total) by mouth every 8 (eight) hours as needed for  muscle spasms. 01/09/19   Jaynee Eagles, PA-C  warfarin (COUMADIN) 5 MG tablet TAKE 1 TO 1 AND 1/2 TABLETS DAILY AS DIRECTED BY COUMADIN CLINIC 05/31/18   Lelon Perla, MD  enoxaparin (LOVENOX) 120 MG/0.8ML injection Inject 0.8 mLs (120 mg total) into the skin every 12 (twelve) hours. 06/19/18 01/09/19  Lelon Perla, MD    Family History Family History  Problem Relation Age of Onset  . Coronary artery disease Mother   . Hypertension Mother   . Diabetes Brother   . Coronary artery disease Other 81       male 1st degree relative, CABG in his 67's (father)    Social History Social History   Tobacco Use  . Smoking status: Never Smoker  . Smokeless tobacco: Never Used  Substance Use Topics  . Alcohol use: No    Alcohol/week: 0.0 standard drinks  . Drug use: No     Allergies   Diltiazem and Insulin glargine   Review of Systems Review of Systems  Constitutional: Negative for chills and fever.  HENT: Negative.   Respiratory: Negative for cough and shortness of breath.   Cardiovascular: Positive for leg swelling. Negative for chest pain.  Gastrointestinal: Negative for abdominal pain, constipation, diarrhea, nausea and vomiting.  Genitourinary: Negative for dysuria, flank pain, frequency and hematuria.  Musculoskeletal: Positive for back pain. Negative for arthralgias, gait problem, joint swelling, myalgias and neck pain.  Skin: Negative for color change, rash and wound.  Neurological: Negative for weakness and numbness.     Physical Exam Updated Vital Signs BP 125/80 (BP Location: Right Arm)   Pulse 66   Temp 97.9 F (36.6 C) (Oral)   Resp 18   Ht 6\' 2"  (1.88 m)   Wt 125.2 kg   SpO2 97%   BMI 35.44 kg/m   Physical Exam Vitals signs and nursing note reviewed.  Constitutional:      General: He is not in acute distress.    Appearance: Normal appearance. He is well-developed and normal weight. He is not ill-appearing or diaphoretic.  HENT:     Head: Atraumatic.   Eyes:     General:        Right eye: No discharge.        Left eye: No discharge.  Neck:     Musculoskeletal: Neck supple.  Cardiovascular:     Rate and Rhythm: Normal rate and regular rhythm.     Pulses: Normal pulses.          Radial pulses are 2+ on the right side and 2+ on the left side.       Dorsalis pedis pulses are 2+ on the right side and 2+ on the left side.       Posterior tibial pulses are 2+ on the right side and 2+ on the left side.     Heart sounds: Normal heart sounds. No murmur. No friction rub. No gallop.      Comments: Regular rate and rhythm, mechanical click from aortic valve noted Pulmonary:     Effort: Pulmonary effort is normal. No respiratory distress.     Breath sounds: Normal breath sounds.     Comments: Respirations equal and unlabored, patient able to speak in full sentences, lungs clear to auscultation bilaterally without crackles or decreased air movement. Abdominal:     General: Bowel sounds are normal. There is no distension.     Palpations: Abdomen is soft. There is no mass.     Tenderness: There is no abdominal tenderness. There is no guarding.     Comments: Abdomen soft, nondistended, nontender to palpation in all quadrants without guarding or peritoneal signs, no CVA tenderness bilaterally  Musculoskeletal:     Comments: Tenderness to palpation over right low back. Well healed vertical surgical scar noted over lumbar spine.   Bilateral lower extremities with mild 1+ edema of the ankles, warm and well-perfused  Skin:    General: Skin is warm and dry.     Capillary Refill: Capillary refill takes less than 2 seconds.  Neurological:     Mental Status: He is alert and oriented to person, place, and time.     Comments: Alert, clear speech, following commands. Moving all extremities without difficulty. Bilateral lower extremities with 5/5 strength in proximal and distal muscle groups and with dorsi and plantar flexion. Sensation intact in bilateral  lower extremities. 2+ patellar DTRs bilaterally. Ambulatory with steady gait  Psychiatric:        Mood and Affect: Mood normal.        Behavior: Behavior normal.      ED Treatments / Results  Labs (all labs ordered are listed, but only abnormal results are displayed) Labs Reviewed  BASIC METABOLIC PANEL - Abnormal; Notable for the following components:      Result Value   Glucose, Bld 190 (*)    Calcium 8.8 (*)    All other components within normal limits  CBC - Abnormal; Notable for the following components:   RBC 4.10 (*)    Hemoglobin 11.8 (*)    HCT 33.8 (*)    Platelets 139 (*)    All other components within normal limits    EKG None  Radiology Dg Lumbar Spine Complete  Result Date: 01/19/2019 CLINICAL DATA:  Low back pain.  History L3-4 discectomy. EXAM: LUMBAR SPINE - COMPLETE 4+ VIEW COMPARISON:  MRI of the lumbar spine on 04/24/2018 FINDINGS: No fracture or subluxation identified. Moderate degenerative disc disease at L3-4 with roughly 6 mm anterolisthesis L3 on L4. The S1 level is partially transitional. No bony lesions identified. IMPRESSION: Moderate degenerative disc disease at L3-4 with mild anterolisthesis of L3 on L4. Electronically Signed   By: Aletta Edouard M.D.   On: 01/19/2019 09:38    Procedures Procedures (including critical care time)  Medications Ordered in ED Medications  lidocaine (LIDODERM) 5 % 1 patch (1 patch Transdermal Patch Applied 01/19/19 0835)  methocarbamol (ROBAXIN) tablet 500 mg (500 mg Oral Given 01/19/19 0835)     Initial Impression / Assessment and Plan / ED Course  I have reviewed the triage vital signs and the nursing notes.  Pertinent labs & imaging results that were available during my care of the patient were reviewed by me and considered in my medical decision making (see chart for details).  53 year old male presents for evaluation of right lower back pain, had surgery with Dr. Rolena Infante on his back in January, but  reports this pain is been occurring intermittently over the past month, he has not followed up with his surgeon.  Was seen at urgent care on 8/19 and treated with gabapentin and tizanidine without much improvement.  He has not tried anything else to treat his symptoms.  He denies any numbness, weakness or saddle anesthesia, no loss of bowel or bladder control, no concern for cauda equina.  Tenderness over the right low back musculature just adjacent to L-spine, previous surgical scar noted.  No overlying skin changes.  Will get x-ray.  Patient also reports that over the past 2 days he has noted increasing lower extremity swelling, does have history of CHF and is on Lasix.  Reports swelling seem to improve some with compression socks and elevation but he is still a few pounds up from his typical weight.  No chest pain or shortness of breath.  On arrival patient with normal vitals, well-appearing, lungs are clear with no crackles.  He has mild edema of bilateral ankles.  Will check basic labs to ensure good kidney function, if labs look okay will likely have patient increase his Lasix for a few days.  Patient with good kidney function, no acute electrolyte derangements aside from glucose of 190, no anion gap.  No leukocytosis, hemoglobin 11.8 today, was 13.2 4 months ago, patient reports a month ago he had 2 days with some blood in his stool, none since then, no other bleeding symptoms.  Low suspicion for drop in hemoglobin being related to patient's back pain, as pain was completely relieved with lidocaine patch and Robaxin.  X-ray shows L3-4 degenerative disc disease with mild anterolisthesis but no fractures or other acute abnormalities.  We will have patient follow-up with Dr. Rolena Infante regarding his back pain.  We will have him increase his morning dose of Lasix for the next 3 days and then return to normal and follow-up with his cardiologist and PCP, told him that he should have his hemoglobin rechecked in the  next few weeks as well.  Strict return precautions discussed.  Patient expresses understanding and agreement with plan.  Discharged home in good condition.  Final Clinical Impressions(s) / ED Diagnoses   Final diagnoses:  Acute right-sided low back pain without sciatica  Leg swelling    ED Discharge Orders         Ordered    methocarbamol (ROBAXIN) 500 MG tablet  2 times daily     01/19/19 0953           Jacqlyn Larsen, PA-C 01/19/19 [id number]    Lacretia Leigh, MD 01/21/19 1450

## 2019-01-19 NOTE — ED Triage Notes (Signed)
Pt here for back pain after surgery, lower back right side. Seen on the 19th for same, states that prescriptions aren't helping

## 2019-01-19 NOTE — Discharge Instructions (Signed)
For back pain use salon pas lidocaine patches daily, use Robaxin as needed, do not take any of the other muscle relaxers that you have at home as these do not seem to be helping.  It is extremely important that you call Monday morning to schedule follow-up appointment with Dr. Rolena Infante given persistent back pain over the past month with your recent surgery in January.  Return to the emergency department if you have numbness, weakness or loss of control of your bowels or bladder.  Please increase your morning Lasix dose to 40 mg (2 tablets), and take your typical 20 mg at night for the next 3 days, then return to her usual 20 mg twice a day regimen.  Please call to schedule follow-up appointment with your heart doctor and your primary doctor.  Your hemoglobin showed a slight drop from your lab work in May I would like for you to have this rechecked in the next few weeks with your PCP.  Return for any bleeding symptoms.

## 2019-01-30 ENCOUNTER — Other Ambulatory Visit: Payer: Self-pay

## 2019-01-30 ENCOUNTER — Ambulatory Visit (INDEPENDENT_AMBULATORY_CARE_PROVIDER_SITE_OTHER): Payer: Medicare PPO

## 2019-01-30 DIAGNOSIS — Z5181 Encounter for therapeutic drug level monitoring: Secondary | ICD-10-CM | POA: Diagnosis not present

## 2019-01-30 DIAGNOSIS — I480 Paroxysmal atrial fibrillation: Secondary | ICD-10-CM | POA: Diagnosis not present

## 2019-01-30 LAB — POCT INR: INR: 1.2 — AB (ref 2.0–3.0)

## 2019-01-30 NOTE — Patient Instructions (Signed)
Description   Today take 1.5 tablets and tomorrow take 2 tablets then start taking 1.5 tablets daily except 1 tablet on Mondays, Wednesdays and Fridays.  Next INR in 10 days.  Call us with any new medications or concerns [phone] [facility] [phone].

## 2019-02-08 ENCOUNTER — Ambulatory Visit (INDEPENDENT_AMBULATORY_CARE_PROVIDER_SITE_OTHER): Payer: Medicare PPO

## 2019-02-08 ENCOUNTER — Other Ambulatory Visit: Payer: Self-pay

## 2019-02-08 ENCOUNTER — Other Ambulatory Visit: Payer: Self-pay | Admitting: Internal Medicine

## 2019-02-08 DIAGNOSIS — I480 Paroxysmal atrial fibrillation: Secondary | ICD-10-CM

## 2019-02-08 DIAGNOSIS — Z5181 Encounter for therapeutic drug level monitoring: Secondary | ICD-10-CM

## 2019-02-08 LAB — POCT INR: INR: 1.8 — AB (ref 2.0–3.0)

## 2019-02-08 NOTE — Patient Instructions (Signed)
Description   Take 2 tablets today, then start taking 1.5 tablets daily except 1 tablet on Mondays and Fridays.  Next INR in 2 weeks.  Call us with any new medications or concerns [phone] [facility] [phone].

## 2019-02-20 ENCOUNTER — Ambulatory Visit (INDEPENDENT_AMBULATORY_CARE_PROVIDER_SITE_OTHER): Payer: Medicare PPO | Admitting: *Deleted

## 2019-02-20 ENCOUNTER — Other Ambulatory Visit: Payer: Self-pay

## 2019-02-20 DIAGNOSIS — Z5181 Encounter for therapeutic drug level monitoring: Secondary | ICD-10-CM

## 2019-02-20 DIAGNOSIS — I480 Paroxysmal atrial fibrillation: Secondary | ICD-10-CM | POA: Diagnosis not present

## 2019-02-20 LAB — POCT INR: INR: 1.9 — AB (ref 2.0–3.0)

## 2019-02-20 NOTE — Patient Instructions (Addendum)
Description   Today and tomorrow take 2 tablets, then start taking 1.5 tablets daily except 1 tablet on Fridays.  Next INR in 2 weeks.  Call us with any new medications or concerns [phone] [facility] [phone].

## 2019-02-28 ENCOUNTER — Encounter: Payer: Self-pay | Admitting: Gastroenterology

## 2019-02-28 ENCOUNTER — Ambulatory Visit (INDEPENDENT_AMBULATORY_CARE_PROVIDER_SITE_OTHER): Payer: Medicare PPO | Admitting: Gastroenterology

## 2019-02-28 VITALS — BP 124/70 | HR 86 | Temp 98.4°F | Ht 74.0 in | Wt 293.0 lb

## 2019-02-28 DIAGNOSIS — Z7901 Long term (current) use of anticoagulants: Secondary | ICD-10-CM

## 2019-02-28 DIAGNOSIS — I480 Paroxysmal atrial fibrillation: Secondary | ICD-10-CM | POA: Diagnosis not present

## 2019-02-28 DIAGNOSIS — Z1211 Encounter for screening for malignant neoplasm of colon: Secondary | ICD-10-CM

## 2019-02-28 DIAGNOSIS — I25810 Atherosclerosis of coronary artery bypass graft(s) without angina pectoris: Secondary | ICD-10-CM | POA: Diagnosis not present

## 2019-02-28 NOTE — Patient Instructions (Signed)
If you are age 54 or older, your body mass index should be between 23-30. Your Body mass index is 37.62 kg/m. If this is out of the aforementioned range listed, please consider follow up with your Primary Care Provider.  If you are age 48 or younger, your body mass index should be between 19-25. Your Body mass index is 37.62 kg/m. If this is out of the aformentioned range listed, please consider follow up with your Primary Care Provider.   Your provider has ordered Cologuard testing as an option for colon cancer screening. This is performed by [employer] and may be out of network with your insurance. PRIOR to completing the test, it is YOUR responsibility to contact your insurance about covered benefits for this test. Your out of pocket expense could be anywhere from $0.00 to $649.00.   When you call to check coverage with your insurer, please provide the following information:   -The ONLY provider of Cologuard is [place] code for Cologuard is [phone].  [employer] NPI # [id number]  -[employer] Tax ID # [id number]   We have already sent your demographic and insurance information to [employer] (phone number [phone]) and they should contact you within the next week regarding your test. If you have not heard from them within the next week, please call our office at [phone].    It was a pleasure to see you today!  Dr. Loletha Carrow

## 2019-02-28 NOTE — Progress Notes (Signed)
GI Progress Note  Chief Complaint: Colon cancer screening  Subjective  History: Seen in June 2017 to discuss colon cancer screening.  Given his poorly controlled diabetes at that time, coronary disease with prior bypass and aortic stenosis with anticipated need for AVR revision, I felt a Cologuard test would be best for him.  It was negative in August 2017.  He had a DES to SVG/RCA graft in June 2019.  He has not undergone any further aortic valve surgery.  [name] had back surgery in January 2020, Plavix was held 5 days prior and he was on bridging Lovenox therapy while off Coumadin.  He denies chronic abdominal pain, altered bowel habits or rectal bleeding.  ROS: Cardiovascular:  no chest pain Respiratory: no dyspnea Chronic low back pain is persisted after his discectomy earlier this year  The patient's Past Medical, Family and Social History were reviewed and are on file in the EMR. Past Medical History:  Diagnosis Date  . Ascending aortic dissection ([place])    a. 2000 - with aortic valve involvement. S/p repair of aortic dissection with placement of mechanical AVR.  [name] CAD (coronary artery disease)    a. 2000 VG->PDA @ time of Ao dissection repair;  b. Cardiac CT 06/2010: no CAD, only mild plaque in prox RCA;  c. 2015 Cath: LM nl, LAD nl, LCX nl, RCA 100, VG->PDA 90 (4.5x12 Rebel BMS). d. 10/23/17 instent restenosis SVG to RCA, DES placed.  . Cholelithiasis 08/22/2013  . Chronic diastolic CHF (congestive heart failure) ([place])    a. 03/2016 Echo: EF 55-60%, no rwma, Gr2 DD.  . Diabetes mellitus   . Dyspnea   . Gross hematuria   . H/O mechanical aortic valve replacement    a. 03/2016 Echo: AoV mean gradient 30mmHg, Valve Area (VTI) 1.36 cm^2, (Vmax) 1.22 cm^2, mod dil LA.  [name] Hyperlipidemia   . Hypertension   . NEPHROLITHIASIS, HX OF   . Obesity   . PAF (paroxysmal atrial fibrillation) ([place])    a. H/o such, with recurrence of coarse afib/flutter during ER  consult 03/2012.  . Stroke ([place])   . TRANSIENT ISCHEMIC ATTACK, HX OF    a. In 2004.  [name] Unspecified vitamin D deficiency     Objective:  Med list reviewed  Current Outpatient Medications:  .  amLODipine (NORVASC) 10 MG tablet, TAKE 1 TABLET EVERY DAY, Disp: 30 tablet, Rfl: 0 .  atorvastatin (LIPITOR) 40 MG tablet, TAKE 1 TABLET EVERY DAY  AT  6  PM, Disp: 90 tablet, Rfl: 1 .  cholecalciferol (VITAMIN D) 1000 UNITS tablet, Take 1,000 Units by mouth daily., Disp: , Rfl:  .  clopidogrel (PLAVIX) 75 MG tablet, TAKE 1 TABLET EVERY DAY, Disp: 30 tablet, Rfl: 0 .  furosemide (LASIX) 40 MG tablet, TAKE 1 TABLET TWICE DAILY, Disp: 60 tablet, Rfl: 0 .  gabapentin (NEURONTIN) 300 MG capsule, Take 1 capsule (300 mg total) by mouth 3 (three) times daily., Disp: 90 capsule, Rfl: 0 .  glimepiride (AMARYL) 1 MG tablet, TAKE 3 TABLETS ONE TIME DAILY WITH BREAKFAST (Patient taking differently: Take 3 mg by mouth daily with breakfast. ), Disp: 270 tablet, Rfl: 3 .  levothyroxine (SYNTHROID, LEVOTHROID) 50 MCG tablet, TAKE 1 TABLET EVERY DAY, Disp: 90 tablet, Rfl: 1 .  losartan (COZAAR) 100 MG tablet, TAKE 1 TABLET EVERY DAY, Disp: 90 tablet, Rfl: 1 .  metFORMIN (GLUCOPHAGE) 1000 MG tablet, Take 1 tablet (1,000 mg total) by mouth 2 (two) times  daily with a meal., Disp: 180 tablet, Rfl: 3 .  metoprolol (TOPROL-XL) 200 MG 24 hr tablet, Take 1 tablet (200 mg total) by mouth daily. Take with or immediately following a meal., Disp: 90 tablet, Rfl: 3 .  nitroGLYCERIN (NITROSTAT) 0.4 MG SL tablet, Place 1 tablet (0.4 mg total) under the tongue every 5 (five) minutes x 3 doses as needed for chest pain., Disp: 25 tablet, Rfl: 2 .  sildenafil (REVATIO) 20 MG tablet, TAKE 2-5 TABLETS BY MOUTH 30 MINUTES PRIOR TO INTERCOURSE, Disp: 30 tablet, Rfl: 0 .  warfarin (COUMADIN) 5 MG tablet, TAKE 1 TO 1 AND 1/2 TABLETS DAILY AS DIRECTED BY COUMADIN CLINIC, Disp: 135 tablet, Rfl: 0   Vital signs in last 24 hrs: Vitals:    02/28/19 1326  BP: 124/70  Pulse: 86  Temp: 98.4 F (36.9 C)    Physical Exam  Well-appearing, obese.  Metallic valve click can be heard across the room  HEENT: sclera anicteric, oral mucosa moist without lesions  Neck: supple, no thyromegaly, JVD or lymphadenopathy  Cardiac: RRR with metallic valve sound, mild pretibial edema bilaterally  Pulm: clear to auscultation bilaterally, normal RR and effort noted  Abdomen: soft, no tenderness, with active bowel sounds. No guarding or palpable hepatosplenomegaly, limited by BMI  Skin; warm and dry, no jaundice or rash  Radiologic studies: June 2019 Cardiac echo:  Study Conclusions   - Left ventricle: The cavity size was mildly dilated. There was   moderate concentric hypertrophy. Systolic function was normal.   The estimated ejection fraction was in the range of 55% to 60%.   There is akinesis of the basalinferolateral and inferior   myocardium. The study is not technically sufficient to allow   evaluation of LV diastolic function. - Aortic valve: A mechanical prosthesis was present. Mean gradient   (S): 21 mm Hg. Valve area (VTI): 1.84 cm^2. Valve area (Vmax):   1.83 cm^2. Valve area (Vmean): 1.92 cm^2. - Mitral valve: There was mild regurgitation. - Left atrium: The atrium was mildly dilated. - Pulmonic valve: There was trivial regurgitation. - Pulmonary arteries: PA peak pressure: 40 mm Hg (S).   Impressions:   - Compared to prior echo, the mean AV gradient has increased from   19 to 27mmHg. The right ventricular systolic pressure was   increased consistent with mild pulmonary hypertension.    @ASSESSMENTPLANBEGIN @ Assessment: Encounter Diagnoses  Name Primary?  . Special screening for malignant neoplasms, colon Yes  . Coronary artery disease involving coronary bypass graft, angina presence unspecified, unspecified whether native or transplanted heart   . PAF (paroxysmal atrial fibrillation) ([place])   . Current use  of long term anticoagulation     He is still high risk for colonoscopy.  Cologuard would be better for him.  If positive, colonoscopy to follow with assistance from cardiology regarding his antiplatelet and need for bridging Lovenox therapy.    Nelida Meuse III

## 2019-03-06 ENCOUNTER — Ambulatory Visit (INDEPENDENT_AMBULATORY_CARE_PROVIDER_SITE_OTHER): Payer: Medicare PPO | Admitting: Pharmacist

## 2019-03-06 ENCOUNTER — Other Ambulatory Visit: Payer: Self-pay

## 2019-03-06 DIAGNOSIS — Z5181 Encounter for therapeutic drug level monitoring: Secondary | ICD-10-CM

## 2019-03-06 DIAGNOSIS — I480 Paroxysmal atrial fibrillation: Secondary | ICD-10-CM

## 2019-03-06 LAB — POCT INR: INR: 3.2 — AB (ref 2.0–3.0)

## 2019-03-06 NOTE — Patient Instructions (Signed)
Description   Continue taking 1.5 tablets daily except 1 tablet on Fridays. Next INR in 3 weeks.  Call us with any new medications or concerns [phone] [facility] [phone].

## 2019-03-11 DIAGNOSIS — Z1212 Encounter for screening for malignant neoplasm of rectum: Secondary | ICD-10-CM | POA: Diagnosis not present

## 2019-03-11 DIAGNOSIS — Z1211 Encounter for screening for malignant neoplasm of colon: Secondary | ICD-10-CM | POA: Diagnosis not present

## 2019-03-20 ENCOUNTER — Other Ambulatory Visit: Payer: Self-pay

## 2019-03-20 LAB — COLOGUARD: Cologuard: NEGATIVE

## 2019-03-27 ENCOUNTER — Ambulatory Visit (INDEPENDENT_AMBULATORY_CARE_PROVIDER_SITE_OTHER): Payer: Medicare PPO | Admitting: *Deleted

## 2019-03-27 ENCOUNTER — Other Ambulatory Visit: Payer: Self-pay

## 2019-03-27 DIAGNOSIS — I480 Paroxysmal atrial fibrillation: Secondary | ICD-10-CM | POA: Diagnosis not present

## 2019-03-27 DIAGNOSIS — Z5181 Encounter for therapeutic drug level monitoring: Secondary | ICD-10-CM | POA: Diagnosis not present

## 2019-03-27 LAB — POCT INR: INR: 2.8 (ref 2.0–3.0)

## 2019-03-27 NOTE — Patient Instructions (Signed)
Description   Continue taking 1.5 tablets daily except 1 tablet on Fridays. Next INR in 4 weeks.  Call us with any new medications or concerns [phone] [facility] [phone].

## 2019-04-02 ENCOUNTER — Ambulatory Visit (INDEPENDENT_AMBULATORY_CARE_PROVIDER_SITE_OTHER): Payer: Medicare PPO | Admitting: Internal Medicine

## 2019-04-02 ENCOUNTER — Other Ambulatory Visit: Payer: Self-pay

## 2019-04-02 ENCOUNTER — Other Ambulatory Visit (INDEPENDENT_AMBULATORY_CARE_PROVIDER_SITE_OTHER): Payer: Medicare PPO

## 2019-04-02 ENCOUNTER — Other Ambulatory Visit: Payer: Self-pay | Admitting: Internal Medicine

## 2019-04-02 ENCOUNTER — Encounter: Payer: Self-pay | Admitting: Internal Medicine

## 2019-04-02 VITALS — BP 116/78 | HR 65 | Temp 98.5°F | Ht 74.0 in | Wt 299.0 lb

## 2019-04-02 DIAGNOSIS — E559 Vitamin D deficiency, unspecified: Secondary | ICD-10-CM

## 2019-04-02 DIAGNOSIS — I10 Essential (primary) hypertension: Secondary | ICD-10-CM | POA: Diagnosis not present

## 2019-04-02 DIAGNOSIS — E119 Type 2 diabetes mellitus without complications: Secondary | ICD-10-CM | POA: Diagnosis not present

## 2019-04-02 DIAGNOSIS — E611 Iron deficiency: Secondary | ICD-10-CM

## 2019-04-02 DIAGNOSIS — Z7901 Long term (current) use of anticoagulants: Secondary | ICD-10-CM

## 2019-04-02 DIAGNOSIS — E7800 Pure hypercholesterolemia, unspecified: Secondary | ICD-10-CM | POA: Diagnosis not present

## 2019-04-02 DIAGNOSIS — E538 Deficiency of other specified B group vitamins: Secondary | ICD-10-CM | POA: Diagnosis not present

## 2019-04-02 DIAGNOSIS — Z0000 Encounter for general adult medical examination without abnormal findings: Secondary | ICD-10-CM

## 2019-04-02 LAB — BASIC METABOLIC PANEL WITH GFR
BUN: 12 mg/dL (ref 6–23)
CO2: 27 meq/L (ref 19–32)
Calcium: 8.9 mg/dL (ref 8.4–10.5)
Chloride: 103 meq/L (ref 96–112)
Creatinine, Ser: 1.02 mg/dL (ref 0.40–1.50)
GFR: 92.04 mL/min
Glucose, Bld: 178 mg/dL — ABNORMAL HIGH (ref 70–99)
Potassium: 3.6 meq/L (ref 3.5–5.1)
Sodium: 138 meq/L (ref 135–145)

## 2019-04-02 LAB — HEPATIC FUNCTION PANEL
ALT: 10 U/L (ref 0–53)
AST: 17 U/L (ref 0–37)
Albumin: 4 g/dL (ref 3.5–5.2)
Alkaline Phosphatase: 69 U/L (ref 39–117)
Bilirubin, Direct: 0.2 mg/dL (ref 0.0–0.3)
Total Bilirubin: 0.8 mg/dL (ref 0.2–1.2)
Total Protein: 7.5 g/dL (ref 6.0–8.3)

## 2019-04-02 LAB — VITAMIN D 25 HYDROXY (VIT D DEFICIENCY, FRACTURES): VITD: 28.75 ng/mL — ABNORMAL LOW (ref 30.00–100.00)

## 2019-04-02 LAB — LIPID PANEL
Cholesterol: 132 mg/dL (ref 0–200)
HDL: 35 mg/dL — ABNORMAL LOW (ref >39.00)
LDL Cholesterol: 73 mg/dL (ref 0–99)
NonHDL: 97.42
Total CHOL/HDL Ratio: 4
Triglycerides: 123 mg/dL (ref 0.0–149.0)
VLDL: 24.6 mg/dL (ref 0.0–40.0)

## 2019-04-02 LAB — IBC PANEL
Iron: 62 ug/dL (ref 42–165)
Saturation Ratios: 18.8 % — ABNORMAL LOW (ref 20.0–50.0)
Transferrin: 236 mg/dL (ref 212.0–360.0)

## 2019-04-02 LAB — VITAMIN B12: Vitamin B-12: 254 pg/mL (ref 211–911)

## 2019-04-02 LAB — HEMOGLOBIN A1C: Hgb A1c MFr Bld: 6.9 % — ABNORMAL HIGH (ref 4.6–6.5)

## 2019-04-02 MED ORDER — VITAMIN D (ERGOCALCIFEROL) 1.25 MG (50000 UNIT) PO CAPS: 50000.0000 [IU] | ORAL_CAPSULE | ORAL | 0 refills | Status: DC

## 2019-04-02 NOTE — Assessment & Plan Note (Signed)
stable overall by history and exam, recent data reviewed with pt, and pt to continue medical treatment as before,  to f/u any worsening symptoms or concerns  

## 2019-04-02 NOTE — Patient Instructions (Signed)

## 2019-04-02 NOTE — Assessment & Plan Note (Signed)
With goal 2.5 - 3.5, stable overall by history and exam, recent data reviewed with pt, and pt to continue medical treatment as before,  to f/u any worsening symptoms or concerns

## 2019-04-02 NOTE — Progress Notes (Signed)
Subjective:    Patient ID: [name], male    DOB: 1965-12-02, 53 y.o.   MRN: [id number]  HPI  Here to f/u; overall doing ok,  Pt denies chest pain, increasing sob or doe, wheezing, orthopnea, PND, increased LE swelling, palpitations, dizziness or syncope.  Pt denies new neurological symptoms such as new headache, or facial or extremity weakness or numbness.  Pt denies polydipsia, polyuria, or low sugar episode.  Pt states overall good compliance with meds, mostly trying to follow appropriate diet, with wt overall stable,  but little exercise however.  S/p lumbar surgury with improvement of RLE numbness, but still with right LBP., Pt continues to have recurring LBP without change in severity, bowel or bladder change, fever, wt loss,  worsening LE pain/numbness/weakness, gait change or falls.  No new compalints Past Medical History:  Diagnosis Date  . Ascending aortic dissection ([place])    a. 2000 - with aortic valve involvement. S/p repair of aortic dissection with placement of mechanical AVR.  [name] CAD (coronary artery disease)    a. 2000 VG->PDA @ time of Ao dissection repair;  b. Cardiac CT 06/2010: no CAD, only mild plaque in prox RCA;  c. 2015 Cath: LM nl, LAD nl, LCX nl, RCA 100, VG->PDA 90 (4.5x12 Rebel BMS). d. 10/23/17 instent restenosis SVG to RCA, DES placed.  . Cholelithiasis 08/22/2013  . Chronic diastolic CHF (congestive heart failure) ([place])    a. 03/2016 Echo: EF 55-60%, no rwma, Gr2 DD.  . Diabetes mellitus   . Dyspnea   . Gross hematuria   . H/O mechanical aortic valve replacement    a. 03/2016 Echo: AoV mean gradient 73mmHg, Valve Area (VTI) 1.36 cm^2, (Vmax) 1.22 cm^2, mod dil LA.  [name] Hyperlipidemia   . Hypertension   . NEPHROLITHIASIS, HX OF   . Obesity   . PAF (paroxysmal atrial fibrillation) ([place])    a. H/o such, with recurrence of coarse afib/flutter during ER consult 03/2012.  . Stroke ([place])   . TRANSIENT ISCHEMIC ATTACK, HX OF    a. In 2004.  [name] Unspecified vitamin D  deficiency    Past Surgical History:  Procedure Laterality Date  . AORTIC VALVE REPLACEMENT  2000  . APPENDECTOMY  1998  . CORONARY ARTERY BYPASS GRAFT  2000  . CORONARY STENT INTERVENTION N/A 10/23/2017   Procedure: CORONARY STENT INTERVENTION;  Surgeon: [name], MD;  Location: [facility];  Service: Cardiovascular;  Laterality: N/A;  . LEFT AND RIGHT HEART CATHETERIZATION WITH CORONARY ANGIOGRAM N/A 07/19/2013   Procedure: LEFT AND RIGHT HEART CATHETERIZATION WITH CORONARY ANGIOGRAM;  Surgeon: [name], MD;  Location: [facility] CATH LAB;  Service: Cardiovascular;  Laterality: N/A;  . LUMBAR LAMINECTOMY/DECOMPRESSION MICRODISCECTOMY Right 06/06/2018   Procedure: Right L3-4 disectomy;  Surgeon: Melina Schools, MD;  Location: [facility];  Service: Orthopedics;  Laterality: Right;  2.5 hrs  . RIGHT HEART CATH AND CORONARY/GRAFT ANGIOGRAPHY N/A 10/23/2017   Procedure: RIGHT HEART CATH AND CORONARY/GRAFT ANGIOGRAPHY;  Surgeon: [name], MD;  Location: [facility];  Service: Cardiovascular;  Laterality: N/A;  . TEE WITHOUT CARDIOVERSION N/A 07/22/2013   Procedure: TRANSESOPHAGEAL ECHOCARDIOGRAM (TEE);  Surgeon: [name], MD;  Location: [facility] ENDOSCOPY;  Service: Cardiovascular;  Laterality: N/A;    reports that he has never smoked. He has never used smokeless tobacco. He reports that he does not drink alcohol or use drugs. family history includes Cancer in his father; Coronary artery disease in his mother; Coronary  artery disease (age of onset: 52) in an other family member; Diabetes in his brother; Hypertension in his mother. Allergies  Allergen Reactions  . Diltiazem Hives  . Insulin Glargine Swelling    edema   Current Outpatient Medications on File Prior to Visit  Medication Sig Dispense Refill  . amLODipine (NORVASC) 10 MG tablet TAKE 1 TABLET EVERY DAY 30 tablet 0  . atorvastatin (LIPITOR) 40 MG tablet TAKE 1 TABLET EVERY DAY  AT  6  PM 90 tablet 1  .  cholecalciferol (VITAMIN D) 1000 UNITS tablet Take 1,000 Units by mouth daily.    . clopidogrel (PLAVIX) 75 MG tablet TAKE 1 TABLET EVERY DAY 30 tablet 0  . furosemide (LASIX) 40 MG tablet TAKE 1 TABLET TWICE DAILY 60 tablet 0  . gabapentin (NEURONTIN) 300 MG capsule Take 1 capsule (300 mg total) by mouth 3 (three) times daily. 90 capsule 0  . glimepiride (AMARYL) 1 MG tablet TAKE 3 TABLETS ONE TIME DAILY WITH BREAKFAST (Patient taking differently: Take 3 mg by mouth daily with breakfast. ) 270 tablet 3  . levothyroxine (SYNTHROID, LEVOTHROID) 50 MCG tablet TAKE 1 TABLET EVERY DAY 90 tablet 1  . losartan (COZAAR) 100 MG tablet TAKE 1 TABLET EVERY DAY 90 tablet 1  . metFORMIN (GLUCOPHAGE) 1000 MG tablet Take 1 tablet (1,000 mg total) by mouth 2 (two) times daily with a meal. 180 tablet 3  . metoprolol (TOPROL-XL) 200 MG 24 hr tablet Take 1 tablet (200 mg total) by mouth daily. Take with or immediately following a meal. 90 tablet 3  . nitroGLYCERIN (NITROSTAT) 0.4 MG SL tablet Place 1 tablet (0.4 mg total) under the tongue every 5 (five) minutes x 3 doses as needed for chest pain. 25 tablet 2  . sildenafil (REVATIO) 20 MG tablet TAKE 2-5 TABLETS BY MOUTH 30 MINUTES PRIOR TO INTERCOURSE 30 tablet 0  . warfarin (COUMADIN) 5 MG tablet TAKE 1 TO 1 AND 1/2 TABLETS DAILY AS DIRECTED BY COUMADIN CLINIC 135 tablet 0  . [DISCONTINUED] enoxaparin (LOVENOX) 120 MG/0.8ML injection Inject 0.8 mLs (120 mg total) into the skin every 12 (twelve) hours. 10 Syringe 1   No current facility-administered medications on file prior to visit.    Review of Systems . Constitutional: Negative for other unusual diaphoresis or sweats HENT: Negative for ear discharge or swelling Eyes: Negative for other worsening visual disturbances Respiratory: Negative for stridor or other swelling  Gastrointestinal: Negative for worsening distension or other blood Genitourinary: Negative for retention or other urinary change  Musculoskeletal: Negative for other MSK pain or swelling Skin: Negative for color change or other new lesions Neurological: Negative for worsening tremors and other numbness  Psychiatric/Behavioral: Negative for worsening agitation or other fatigue All otherwise neg per pt     Objective:   Physical Exam BP 116/78   Pulse 65   Temp 98.5 F (36.9 C) (Oral)   Ht 6\' 2"  (1.88 m)   Wt 299 lb (135.6 kg)   SpO2 98%   BMI 38.39 kg/m  VS noted,  Constitutional: Pt appears in NAD HENT: Head: NCAT.  Right Ear: External ear normal.  Left Ear: External ear normal.  Eyes: . Pupils are equal, round, and reactive to light. Conjunctivae and EOM are normal Nose: without d/c or deformity Neck: Neck supple. Gross normal ROM Cardiovascular: Normal rate and regular rhythm.   Pulmonary/Chest: Effort normal and breath sounds without rales or wheezing.  Abd:  Soft, NT, ND, + BS, no organomegaly Neurological: Pt  is alert. At baseline orientation, motor grossly intact Skin: Skin is warm. No rashes, other new lesions, no LE edema Psychiatric: Pt behavior is normal without agitation  All otherwise neg per pt  Lab Results  Component Value Date   WBC 4.6 01/19/2019   HGB 11.8 (L) 01/19/2019   HCT 33.8 (L) 01/19/2019   PLT 139 (L) 01/19/2019   GLUCOSE 190 (H) 01/19/2019   CHOL 121 10/02/2018   TRIG 224.0 (H) 10/02/2018   HDL 27.60 (L) 10/02/2018   LDLDIRECT 50.0 10/02/2018   LDLCALC 48 03/07/2018   ALT 10 10/02/2018   AST 21 10/02/2018   NA 139 01/19/2019   K 3.7 01/19/2019   CL 108 01/19/2019   CREATININE 1.13 01/19/2019   BUN 12 01/19/2019   CO2 24 01/19/2019   TSH 4.19 10/02/2018   PSA 1.04 10/02/2018   INR 2.8 03/27/2019   HGBA1C 6.9 (H) 10/02/2018   MICROALBUR 3.6 (H) 08/31/2017       Assessment & Plan:

## 2019-04-24 ENCOUNTER — Other Ambulatory Visit: Payer: Self-pay

## 2019-04-24 ENCOUNTER — Ambulatory Visit (INDEPENDENT_AMBULATORY_CARE_PROVIDER_SITE_OTHER): Payer: Medicare PPO | Admitting: *Deleted

## 2019-04-24 DIAGNOSIS — I480 Paroxysmal atrial fibrillation: Secondary | ICD-10-CM | POA: Diagnosis not present

## 2019-04-24 DIAGNOSIS — Z5181 Encounter for therapeutic drug level monitoring: Secondary | ICD-10-CM

## 2019-04-24 LAB — POCT INR: INR: 2.9 (ref 2.0–3.0)

## 2019-04-24 NOTE — Patient Instructions (Signed)
Description   Continue taking 1.5 tablets daily except 1 tablet on Fridays. Next INR in 6 weeks.  Call us with any new medications or concerns [phone] [facility] [phone].

## 2019-06-04 ENCOUNTER — Ambulatory Visit (INDEPENDENT_AMBULATORY_CARE_PROVIDER_SITE_OTHER): Payer: Medicare PPO | Admitting: *Deleted

## 2019-06-04 ENCOUNTER — Other Ambulatory Visit: Payer: Self-pay

## 2019-06-04 DIAGNOSIS — I480 Paroxysmal atrial fibrillation: Secondary | ICD-10-CM | POA: Diagnosis not present

## 2019-06-04 DIAGNOSIS — Z5181 Encounter for therapeutic drug level monitoring: Secondary | ICD-10-CM | POA: Diagnosis not present

## 2019-06-04 LAB — POCT INR: INR: 1.2 — AB (ref 2.0–3.0)

## 2019-06-04 NOTE — Patient Instructions (Signed)
Description   Today and tomorrow take 2 tablets then continue taking 1.5 tablets daily except 1 tablet on Fridays. Next INR in 1 week (normally 6 weeks).  Call us with any new medications or concerns [phone] [facility] [phone].

## 2019-06-07 ENCOUNTER — Other Ambulatory Visit: Payer: Self-pay | Admitting: Internal Medicine

## 2019-06-07 ENCOUNTER — Other Ambulatory Visit: Payer: Self-pay | Admitting: Cardiology

## 2019-06-12 ENCOUNTER — Other Ambulatory Visit: Payer: Self-pay

## 2019-06-12 ENCOUNTER — Ambulatory Visit (INDEPENDENT_AMBULATORY_CARE_PROVIDER_SITE_OTHER): Payer: Medicare PPO

## 2019-06-12 DIAGNOSIS — Z5181 Encounter for therapeutic drug level monitoring: Secondary | ICD-10-CM | POA: Diagnosis not present

## 2019-06-12 DIAGNOSIS — I480 Paroxysmal atrial fibrillation: Secondary | ICD-10-CM | POA: Diagnosis not present

## 2019-06-12 LAB — POCT INR: INR: 2.3 (ref 2.0–3.0)

## 2019-06-12 NOTE — Patient Instructions (Signed)
Description   Take 2 tablets today, then resume same dosage 1.5 tablets daily except 1 tablet on Fridays. Next INR in 4 weeks (normally 6 weeks).  Call us with any new medications or concerns [phone] [facility] [phone].

## 2019-07-10 ENCOUNTER — Ambulatory Visit (INDEPENDENT_AMBULATORY_CARE_PROVIDER_SITE_OTHER): Payer: Medicare PPO | Admitting: *Deleted

## 2019-07-10 ENCOUNTER — Other Ambulatory Visit: Payer: Self-pay

## 2019-07-10 DIAGNOSIS — Z5181 Encounter for therapeutic drug level monitoring: Secondary | ICD-10-CM

## 2019-07-10 DIAGNOSIS — I480 Paroxysmal atrial fibrillation: Secondary | ICD-10-CM

## 2019-07-10 LAB — POCT INR: INR: 1.5 — AB (ref 2.0–3.0)

## 2019-07-10 NOTE — Patient Instructions (Signed)
Description   Take 2 tablets today and tomorrow then resume same dosage 1.5 tablets daily except 1 tablet on Fridays. Next INR in 10 days. Call us with any new medications or concerns [phone] [facility] [phone].

## 2019-07-19 ENCOUNTER — Ambulatory Visit (INDEPENDENT_AMBULATORY_CARE_PROVIDER_SITE_OTHER): Payer: Medicare PPO | Admitting: *Deleted

## 2019-07-19 ENCOUNTER — Other Ambulatory Visit: Payer: Self-pay

## 2019-07-19 DIAGNOSIS — Z5181 Encounter for therapeutic drug level monitoring: Secondary | ICD-10-CM

## 2019-07-19 DIAGNOSIS — I480 Paroxysmal atrial fibrillation: Secondary | ICD-10-CM | POA: Diagnosis not present

## 2019-07-19 LAB — POCT INR: INR: 4.4 — AB (ref 2.0–3.0)

## 2019-07-19 NOTE — Patient Instructions (Signed)
Description   Hold today and then continue same dosage 1.5 tablets daily except 1 tablet on Fridays. Next INR in 2 weeks. Call us with any new medications or concerns [phone] [facility] [phone].

## 2019-08-02 ENCOUNTER — Other Ambulatory Visit: Payer: Self-pay

## 2019-08-02 ENCOUNTER — Ambulatory Visit (INDEPENDENT_AMBULATORY_CARE_PROVIDER_SITE_OTHER): Payer: Medicare PPO | Admitting: *Deleted

## 2019-08-02 DIAGNOSIS — Z5181 Encounter for therapeutic drug level monitoring: Secondary | ICD-10-CM

## 2019-08-02 DIAGNOSIS — I480 Paroxysmal atrial fibrillation: Secondary | ICD-10-CM | POA: Diagnosis not present

## 2019-08-02 LAB — POCT INR: INR: 4.3 — AB (ref 2.0–3.0)

## 2019-08-02 NOTE — Patient Instructions (Signed)
Description   Hold today and then start taking 1.5 tablets daily except for 1 tablet on Mondays and Fridays. Next INR in 2 weeks. Continue to eat 2 servings of greens a week. Call us with any new medications or concerns [phone] [facility] [phone].

## 2019-08-16 ENCOUNTER — Ambulatory Visit (INDEPENDENT_AMBULATORY_CARE_PROVIDER_SITE_OTHER): Payer: Medicare PPO | Admitting: *Deleted

## 2019-08-16 ENCOUNTER — Other Ambulatory Visit: Payer: Self-pay

## 2019-08-16 DIAGNOSIS — I480 Paroxysmal atrial fibrillation: Secondary | ICD-10-CM

## 2019-08-16 DIAGNOSIS — Z5181 Encounter for therapeutic drug level monitoring: Secondary | ICD-10-CM

## 2019-08-16 LAB — POCT INR: INR: 3.7 — AB (ref 2.0–3.0)

## 2019-08-16 NOTE — Patient Instructions (Addendum)
Description   Today take 1/2 tablet then start taking 1.5 tablets daily except for 1 tablet on Mondays, Wednesdays, and Fridays. Next INR in 3 weeks. Continue to eat 2 servings of greens a week. Call us with any new medications or concerns [phone] [facility] [phone].

## 2019-08-20 ENCOUNTER — Other Ambulatory Visit: Payer: Self-pay | Admitting: Pharmacist

## 2019-08-20 MED ORDER — WARFARIN SODIUM 5 MG PO TABS: ORAL_TABLET | ORAL | 0 refills | Status: DC

## 2019-08-21 ENCOUNTER — Telehealth: Payer: Self-pay

## 2019-08-21 MED ORDER — LOSARTAN POTASSIUM 100 MG PO TABS: 100.0000 mg | ORAL_TABLET | Freq: Every day | ORAL | 1 refills | Status: DC

## 2019-08-22 ENCOUNTER — Other Ambulatory Visit: Payer: Self-pay

## 2019-08-22 MED ORDER — WARFARIN SODIUM 5 MG PO TABS: ORAL_TABLET | ORAL | 1 refills | Status: DC

## 2019-08-26 ENCOUNTER — Other Ambulatory Visit: Payer: Self-pay

## 2019-08-26 MED ORDER — TRUE METRIX AIR GLUCOSE METER DEVI: 1.0000 | Freq: Every day | 1 refills | Status: AC

## 2019-08-26 MED ORDER — ATORVASTATIN CALCIUM 40 MG PO TABS: ORAL_TABLET | ORAL | 1 refills | Status: AC

## 2019-08-26 MED ORDER — TRUE METRIX BLOOD GLUCOSE TEST VI STRP: ORAL_STRIP | 12 refills | Status: AC

## 2019-08-26 NOTE — Telephone Encounter (Signed)
Done erx 

## 2019-08-28 ENCOUNTER — Other Ambulatory Visit: Payer: Self-pay | Admitting: *Deleted

## 2019-08-28 MED ORDER — WARFARIN SODIUM 5 MG PO TABS: ORAL_TABLET | ORAL | 1 refills | Status: AC

## 2019-08-28 NOTE — Telephone Encounter (Signed)
Paper Warfarin refill received from [facility] mail order.

## 2019-09-03 ENCOUNTER — Telehealth: Payer: Self-pay | Admitting: Cardiology

## 2019-09-03 NOTE — Telephone Encounter (Signed)
Patient states he is returning call. I made patient aware that the call may have been in regards to instructions for MyChart Video Visit scheduled for 09/05/19 at 9:45 AM with Kerin Ransom. However, no notes are available.

## 2019-09-03 NOTE — Telephone Encounter (Signed)
Left message to call back  

## 2019-09-05 ENCOUNTER — Other Ambulatory Visit: Payer: Self-pay

## 2019-09-05 ENCOUNTER — Encounter: Payer: Self-pay | Admitting: Cardiology

## 2019-09-05 ENCOUNTER — Telehealth (INDEPENDENT_AMBULATORY_CARE_PROVIDER_SITE_OTHER): Payer: Medicare PPO | Admitting: Cardiology

## 2019-09-05 VITALS — Ht 74.0 in | Wt 285.9 lb

## 2019-09-05 DIAGNOSIS — I5023 Acute on chronic systolic (congestive) heart failure: Secondary | ICD-10-CM

## 2019-09-05 DIAGNOSIS — Z7901 Long term (current) use of anticoagulants: Secondary | ICD-10-CM | POA: Diagnosis not present

## 2019-09-05 DIAGNOSIS — I10 Essential (primary) hypertension: Secondary | ICD-10-CM

## 2019-09-05 DIAGNOSIS — I7101 Dissection of ascending aorta: Secondary | ICD-10-CM

## 2019-09-05 DIAGNOSIS — E119 Type 2 diabetes mellitus without complications: Secondary | ICD-10-CM

## 2019-09-05 DIAGNOSIS — E669 Obesity, unspecified: Secondary | ICD-10-CM

## 2019-09-05 DIAGNOSIS — I25810 Atherosclerosis of coronary artery bypass graft(s) without angina pectoris: Secondary | ICD-10-CM | POA: Diagnosis not present

## 2019-09-05 DIAGNOSIS — R5383 Other fatigue: Secondary | ICD-10-CM | POA: Diagnosis not present

## 2019-09-05 DIAGNOSIS — Z6836 Body mass index (BMI) 36.0-36.9, adult: Secondary | ICD-10-CM

## 2019-09-05 DIAGNOSIS — I480 Paroxysmal atrial fibrillation: Secondary | ICD-10-CM | POA: Diagnosis not present

## 2019-09-05 MED ORDER — AMLODIPINE BESYLATE 10 MG PO TABS: 10.0000 mg | ORAL_TABLET | Freq: Every day | ORAL | 1 refills | Status: DC

## 2019-09-05 MED ORDER — METOPROLOL SUCCINATE ER 200 MG PO TB24: 200.0000 mg | ORAL_TABLET | Freq: Every day | ORAL | 3 refills | Status: DC

## 2019-09-05 MED ORDER — FUROSEMIDE 40 MG PO TABS: 40.0000 mg | ORAL_TABLET | Freq: Two times a day (BID) | ORAL | 3 refills | Status: DC

## 2019-09-05 MED ORDER — CLOPIDOGREL BISULFATE 75 MG PO TABS: 75.0000 mg | ORAL_TABLET | Freq: Every day | ORAL | 1 refills | Status: DC

## 2019-09-05 NOTE — Progress Notes (Signed)
Virtual Visit via Telephone Note   This visit type was conducted due to national recommendations for restrictions regarding the COVID-19 Pandemic (e.g. social distancing) in an effort to limit this patient's exposure and mitigate transmission in our community.  Due to his co-morbid illnesses, this patient is at least at moderate risk for complications without adequate follow up.  This format is felt to be most appropriate for this patient at this time.  The patient did not have access to video technology/had technical difficulties with video requiring transitioning to audio format only (telephone).  All issues noted in this document were discussed and addressed.  No physical exam could be performed with this format.  Please refer to the patient's chart for his  consent to telehealth for [facility].   The patient was identified using 2 identifiers.  Date:  09/05/2019   ID:  [name], DOB 06/04/1965, MRN [id number]  Patient Location: Home Provider Location: Office  PCP:  Zachary Borg, MD  Cardiologist:  Zachary Ruths, MD  Electrophysiologist:  None   Evaluation Performed:  Follow-Up Visit  Chief Complaint:    History of Present Illness:    [name] is a 54 y.o. AA male, former semi-pro football player, with a history of a Bentall procedure in 2004 with CABG. he then had an SVG-RCA PCI with BMS in Feb 201.  He had re do PCI June 2019 with a BMS for ISR. He also has a history of PAF, TIA' 04, NIDDM, and HTN. He saw [name] in the office 11/09/17 and was noted to be in AF. Diltiazem was added and Amlodipine 10 mg was stopped. He is on Coumadin. The pt says he took a couple of doses of diltiazem and developed a rash and itching and stopped it. He was later placed on Amlodipine. His LOV was Aug 2019.  He was contacted today for routine follow-up.  Since we saw him last he had back surgery in January 2020.  Since then he has had problems with persistent back pain.  He  also tells me today that he frequently has daytime fatigue, he says he falls asleep when driving and is stopped driving.  Does snore at night.  He had no unusual chest pain or tachycardia.  He did run out of one of his medications but is not sure which one, will look into this later today.  The patient does not have symptoms concerning for COVID-19 infection (fever, chills, cough, or new shortness of breath).    Past Medical History:  Diagnosis Date  . Ascending aortic dissection ([place])    a. 2000 - with aortic valve involvement. S/p repair of aortic dissection with placement of mechanical AVR.  [name] CAD (coronary artery disease)    a. 2000 VG->PDA @ time of Ao dissection repair;  b. Cardiac CT 06/2010: no CAD, only mild plaque in prox RCA;  c. 2015 Cath: LM nl, LAD nl, LCX nl, RCA 100, VG->PDA 90 (4.5x12 Rebel BMS). d. 10/23/17 instent restenosis SVG to RCA, DES placed.  . Cholelithiasis 08/22/2013  . Chronic diastolic CHF (congestive heart failure) ([employer])    a. 03/2016 Echo: EF 55-60%, no rwma, Gr2 DD.  . Diabetes mellitus   . Dyspnea   . Gross hematuria   . H/O mechanical aortic valve replacement    a. 03/2016 Echo: AoV mean gradient 3mmHg, Valve Area (VTI) 1.36 cm^2, (Vmax) 1.22 cm^2, mod dil LA.  [name] Hyperlipidemia   . Hypertension   . NEPHROLITHIASIS, HX  OF   . Obesity   . PAF (paroxysmal atrial fibrillation) ([place])    a. H/o such, with recurrence of coarse afib/flutter during ER consult 03/2012.  . Stroke ([place])   . TRANSIENT ISCHEMIC ATTACK, HX OF    a. In 2004.  [name] Unspecified vitamin D deficiency    Past Surgical History:  Procedure Laterality Date  . AORTIC VALVE REPLACEMENT  2000  . APPENDECTOMY  1998  . CORONARY ARTERY BYPASS GRAFT  2000  . CORONARY STENT INTERVENTION N/A 10/23/2017   Procedure: CORONARY STENT INTERVENTION;  Surgeon: Zachary Booze, MD;  Location: [place] [facility];  Service: Cardiovascular;  Laterality: N/A;  . LEFT AND RIGHT HEART CATHETERIZATION WITH  CORONARY ANGIOGRAM N/A 07/19/2013   Procedure: LEFT AND RIGHT HEART CATHETERIZATION WITH CORONARY ANGIOGRAM;  Surgeon: Zachary Booze, MD;  Location: [facility] C[facility] LAB;  Service: Cardiovascular;  Laterality: N/A;  . LUMBAR LAMINECTOMY/DECOMPRESSION MICRODISCECTOMY Right 06/06/2018   Procedure: Right L3-4 disectomy;  Surgeon: Zachary Schools, MD;  Location: [place];  Service: Orthopedics;  Laterality: Right;  2.5 hrs  . RIGHT HEART CATH AND CORONARY/GRAFT ANGIOGRAPHY N/A 10/23/2017   Procedure: RIGHT HEART CATH AND CORONARY/GRAFT ANGIOGRAPHY;  Surgeon: Zachary Booze, MD;  Location: [place] [facility];  Service: Cardiovascular;  Laterality: N/A;  . TEE WITHOUT CARDIOVERSION N/A 07/22/2013   Procedure: TRANSESOPHAGEAL ECHOCARDIOGRAM (TEE);  Surgeon: Zachary Hector, MD;  Location[facility] ENDOSCOPY;  Service: Cardiovascular;  Laterality: N/A;     Current Meds  Medication Sig  . amLODipine (NORVASC) 10 MG tablet TAKE 1 TABLET EVERY DAY  . atorvastatin (LIPITOR) 40 MG tablet TAKE 1 TABLET EVERY DAY  AT  6  PM  . Blood Glucose Monitoring Suppl (TRUE METRIX AIR GLUCOSE METER) DEVI 1 Device by Does not apply route daily. E11.9  . cholecalciferol (VITAMIN D) 1000 UNITS tablet Take 1,000 Units by mouth daily.  . clopidogrel (PLAVIX) 75 MG tablet TAKE 1 TABLET EVERY DAY  . furosemide (LASIX) 40 MG tablet TAKE 1 TABLET TWICE DAILY  . gabapentin (NEURONTIN) 300 MG capsule Take 1 capsule (300 mg total) by mouth 3 (three) times daily.  [name] glimepiride (AMARYL) 1 MG tablet TAKE 3 TABLETS ONE TIME DAILY WITH BREAKFAST (Patient taking differently: Take 3 mg by mouth daily with breakfast. )  . glucose blood (TRUE METRIX BLOOD GLUCOSE TEST) test strip Use as directed once daily  . levothyroxine (SYNTHROID) 50 MCG tablet TAKE 1 TABLET EVERY DAY  . losartan (COZAAR) 100 MG tablet Take 1 tablet (100 mg total) by mouth daily.  . metFORMIN (GLUCOPHAGE) 1000 MG tablet Take 1 tablet (1,000 mg total) by mouth 2 (two) times daily  with a meal.  . metoprolol (TOPROL-XL) 200 MG 24 hr tablet Take 1 tablet (200 mg total) by mouth daily. Take with or immediately following a meal.  . nitroGLYCERIN (NITROSTAT) 0.4 MG SL tablet Place 1 tablet (0.4 mg total) under the tongue every 5 (five) minutes x 3 doses as needed for chest pain.  . sildenafil (REVATIO) 20 MG tablet TAKE 2-5 TABLETS BY MOUTH 30 MINUTES PRIOR TO INTERCOURSE  . Vitamin D, Ergocalciferol, (DRISDOL) 1.25 MG (50000 UT) CAPS capsule Take 1 capsule (50,000 Units total) by mouth every 7 (seven) days.  [name] warfarin (COUMADIN) 5 MG tablet TAKE 1 TO 1 AND 1/2 TABLETS EVERY DAY AS DIRECTED BY COUMADIN CLINIC     Allergies:   Diltiazem and Insulin glargine   Social History   Tobacco Use  . Smoking  status: Never Smoker  . Smokeless tobacco: Never Used  Substance Use Topics  . Alcohol use: No    Alcohol/week: 0.0 standard drinks  . Drug use: No     Family Hx: The patient's family history includes Cancer in his father; Coronary artery disease in his mother; Coronary artery disease (age of onset: 53) in an other family member; Diabetes in his brother; Hypertension in his mother.  ROS:   Please see the history of present illness.    All other systems reviewed and are negative.   Prior CV studies:   The following studies were reviewed today: Echo June 2019  Labs/Other Tests and Data Reviewed:    EKG:  No ECG reviewed.  Recent Labs: 10/02/2018: TSH 4.19 01/19/2019: Hemoglobin 11.8; Platelets 139 04/02/2019: ALT 10; BUN 12; Creatinine, Ser 1.02; Potassium 3.6; Sodium 138   Recent Lipid Panel Lab Results  Component Value Date/Time   CHOL 132 04/02/2019 10:37 AM   TRIG 123.0 04/02/2019 10:37 AM   HDL 35.00 (L) 04/02/2019 10:37 AM   CHOLHDL 4 04/02/2019 10:37 AM   LDLCALC 73 04/02/2019 10:37 AM   LDLDIRECT 50.0 10/02/2018 03:46 PM    Wt Readings from Last 3 Encounters:  09/05/19 285 lb 14.4 oz (129.7 kg)  04/02/19 299 lb (135.6 kg)  02/28/19 293 lb  (132.9 kg)     Objective:    Vital Signs:  Ht 6\' 2"  (1.88 m)   Wt 285 lb 14.4 oz (129.7 kg)   BMI 36.71 kg/m    VITAL SIGNS:  reviewed  ASSESSMENT & PLAN:    Suspected sleep apnea- Classic symptoms- he needs sleep study  PAF (paroxysmal atrial fibrillation) ([place]) Recurrent June 2019- He had an allergic reaction to Diltiazem.   Status post coronary artery stent placement SVG-RCA PCI in 2015 and June 2019 (BMS)  Ascending aortic dissection  in 2000- s/p Bentall and MDT tilting disc AVR 2000 - with aortic valve involvement. S/p repair of aortic dissection with placement of mechanical AVR.  Essential hypertension Unable to take his B/P at home- last B/P 116/78-Nov 2020  Obesity (BMI 30-39.9) BMI 36.7- he snores, has daytime fatigue, and falls asleep while driving.   Plan: Sleep study. Review meds.  OV 2-3 months.    COVID-19 Education: The signs and symptoms of COVID-19 were discussed with the patient and how to seek care for testing (follow up with PCP or arrange E-visit).  The importance of social distancing was discussed today.  Time:   Today, I have spent 15 minutes with the patient with telehealth technology discussing the above problems.     Medication Adjustments/Labs and Tests Ordered: Current medicines are reviewed at length with the patient today.  Concerns regarding medicines are outlined above.   Tests Ordered: No orders of the defined types were placed in this encounter.   Medication Changes: No orders of the defined types were placed in this encounter.   Follow Up:  In Person 6 months  Signed, Kerin Ransom, Hershal Coria  09/05/2019 10:11 AM    [facility]

## 2019-09-05 NOTE — Patient Instructions (Signed)
Medication Instructions:  Your physician recommends that you continue on your current medications as directed. Please refer to the Current Medication list given to you today.  *If you need a refill on your cardiac medications before your next appointment, please call your pharmacy*   Follow-Up: At [facility], you and your health needs are our priority.  As part of our continuing mission to provide you with exceptional heart care, we have created designated Provider Care Teams.  These Care Teams include your primary Cardiologist (physician) and Advanced Practice Providers (APPs -  Physician Assistants and Nurse Practitioners) who all work together to provide you with the care you need, when you need it.  We recommend signing up for the patient portal called "MyChart".  Sign up information is provided on this After Visit Summary.  MyChart is used to connect with patients for Virtual Visits (Telemedicine).  Patients are able to view lab/test results, encounter notes, upcoming appointments, etc.  Non-urgent messages can be sent to your provider as well.   To learn more about what you can do with MyChart, go to [email or web address].    Your next appointment:   3 month(s)  The format for your next appointment:   In Person  Provider:   You may see Kirk Ruths, MD or one of the following Advanced Practice Providers on your designated Care Team:    Kerin Ransom, [place]   Other Instructions Our office will call you to schedule a sleep study.

## 2019-09-05 NOTE — Telephone Encounter (Signed)
This is Dr. Crenshaw's pt 

## 2019-09-06 ENCOUNTER — Ambulatory Visit (INDEPENDENT_AMBULATORY_CARE_PROVIDER_SITE_OTHER): Payer: Medicare PPO | Admitting: *Deleted

## 2019-09-06 ENCOUNTER — Other Ambulatory Visit: Payer: Self-pay

## 2019-09-06 DIAGNOSIS — I480 Paroxysmal atrial fibrillation: Secondary | ICD-10-CM

## 2019-09-06 DIAGNOSIS — Z5181 Encounter for therapeutic drug level monitoring: Secondary | ICD-10-CM

## 2019-09-06 LAB — POCT INR: INR: 2.3 (ref 2.0–3.0)

## 2019-09-06 NOTE — Patient Instructions (Signed)
Description   Today take 1.5 tablets then continue taking 1.5 tablets daily except for 1 tablet on Mondays, Wednesdays, and Fridays. Next INR in 3 weeks. Continue to eat 2 servings of greens a week. Call us with any new medications or concerns [phone] [facility] [phone].

## 2019-09-06 NOTE — Telephone Encounter (Signed)
Called patient, he states he has no other questions or concerns. Telephone visit is complete.

## 2019-09-08 ENCOUNTER — Emergency Department (HOSPITAL_COMMUNITY): Payer: Medicare PPO

## 2019-09-08 ENCOUNTER — Encounter (HOSPITAL_COMMUNITY): Payer: Self-pay | Admitting: Emergency Medicine

## 2019-09-08 ENCOUNTER — Other Ambulatory Visit: Payer: Self-pay

## 2019-09-08 ENCOUNTER — Inpatient Hospital Stay (HOSPITAL_COMMUNITY)
Admission: EM | Admit: 2019-09-08 | Discharge: 2019-10-07 | DRG: 216 | Disposition: A | Discharge status: Home Health | Payer: Medicare PPO | Attending: Surgery | Admitting: Surgery

## 2019-09-08 DIAGNOSIS — K0889 Other specified disorders of teeth and supporting structures: Secondary | ICD-10-CM | POA: Diagnosis not present

## 2019-09-08 DIAGNOSIS — M899 Disorder of bone, unspecified: Secondary | ICD-10-CM | POA: Diagnosis not present

## 2019-09-08 DIAGNOSIS — N1831 Chronic kidney disease, stage 3a: Secondary | ICD-10-CM | POA: Diagnosis present

## 2019-09-08 DIAGNOSIS — I480 Paroxysmal atrial fibrillation: Secondary | ICD-10-CM | POA: Diagnosis not present

## 2019-09-08 DIAGNOSIS — Z01818 Encounter for other preprocedural examination: Secondary | ICD-10-CM | POA: Diagnosis not present

## 2019-09-08 DIAGNOSIS — Z952 Presence of prosthetic heart valve: Secondary | ICD-10-CM | POA: Diagnosis not present

## 2019-09-08 DIAGNOSIS — R7881 Bacteremia: Secondary | ICD-10-CM | POA: Diagnosis not present

## 2019-09-08 DIAGNOSIS — I97190 Other postprocedural cardiac functional disturbances following cardiac surgery: Secondary | ICD-10-CM | POA: Diagnosis present

## 2019-09-08 DIAGNOSIS — E1129 Type 2 diabetes mellitus with other diabetic kidney complication: Secondary | ICD-10-CM | POA: Diagnosis not present

## 2019-09-08 DIAGNOSIS — Z6837 Body mass index (BMI) 37.0-37.9, adult: Secondary | ICD-10-CM

## 2019-09-08 DIAGNOSIS — I7101 Dissection of thoracic aorta: Secondary | ICD-10-CM | POA: Diagnosis not present

## 2019-09-08 DIAGNOSIS — I25700 Atherosclerosis of coronary artery bypass graft(s), unspecified, with unstable angina pectoris: Secondary | ICD-10-CM

## 2019-09-08 DIAGNOSIS — R918 Other nonspecific abnormal finding of lung field: Secondary | ICD-10-CM | POA: Diagnosis not present

## 2019-09-08 DIAGNOSIS — M5126 Other intervertebral disc displacement, lumbar region: Secondary | ICD-10-CM | POA: Diagnosis present

## 2019-09-08 DIAGNOSIS — I25810 Atherosclerosis of coronary artery bypass graft(s) without angina pectoris: Secondary | ICD-10-CM | POA: Diagnosis not present

## 2019-09-08 DIAGNOSIS — T501X5A Adverse effect of loop [high-ceiling] diuretics, initial encounter: Secondary | ICD-10-CM | POA: Diagnosis not present

## 2019-09-08 DIAGNOSIS — D4989 Neoplasm of unspecified behavior of other specified sites: Secondary | ICD-10-CM

## 2019-09-08 DIAGNOSIS — R0989 Other specified symptoms and signs involving the circulatory and respiratory systems: Secondary | ICD-10-CM

## 2019-09-08 DIAGNOSIS — E119 Type 2 diabetes mellitus without complications: Secondary | ICD-10-CM

## 2019-09-08 DIAGNOSIS — D62 Acute posthemorrhagic anemia: Secondary | ICD-10-CM | POA: Diagnosis not present

## 2019-09-08 DIAGNOSIS — C9000 Multiple myeloma not having achieved remission: Secondary | ICD-10-CM | POA: Diagnosis not present

## 2019-09-08 DIAGNOSIS — R079 Chest pain, unspecified: Secondary | ICD-10-CM | POA: Diagnosis not present

## 2019-09-08 DIAGNOSIS — I079 Rheumatic tricuspid valve disease, unspecified: Secondary | ICD-10-CM | POA: Diagnosis not present

## 2019-09-08 DIAGNOSIS — R509 Fever, unspecified: Secondary | ICD-10-CM | POA: Diagnosis not present

## 2019-09-08 DIAGNOSIS — E039 Hypothyroidism, unspecified: Secondary | ICD-10-CM | POA: Diagnosis present

## 2019-09-08 DIAGNOSIS — I350 Nonrheumatic aortic (valve) stenosis: Secondary | ICD-10-CM | POA: Diagnosis not present

## 2019-09-08 DIAGNOSIS — C9010 Plasma cell leukemia not having achieved remission: Secondary | ICD-10-CM | POA: Diagnosis not present

## 2019-09-08 DIAGNOSIS — R000 Tachycardia, unspecified: Secondary | ICD-10-CM | POA: Diagnosis not present

## 2019-09-08 DIAGNOSIS — I130 Hypertensive heart and chronic kidney disease with heart failure and stage 1 through stage 4 chronic kidney disease, or unspecified chronic kidney disease: Secondary | ICD-10-CM | POA: Diagnosis present

## 2019-09-08 DIAGNOSIS — M79675 Pain in left toe(s): Secondary | ICD-10-CM | POA: Diagnosis not present

## 2019-09-08 DIAGNOSIS — Y831 Surgical operation with implant of artificial internal device as the cause of abnormal reaction of the patient, or of later complication, without mention of misadventure at the time of the procedure: Secondary | ICD-10-CM | POA: Diagnosis present

## 2019-09-08 DIAGNOSIS — Z9689 Presence of other specified functional implants: Secondary | ICD-10-CM | POA: Diagnosis not present

## 2019-09-08 DIAGNOSIS — I339 Acute and subacute endocarditis, unspecified: Secondary | ICD-10-CM | POA: Diagnosis not present

## 2019-09-08 DIAGNOSIS — I340 Nonrheumatic mitral (valve) insufficiency: Secondary | ICD-10-CM | POA: Diagnosis not present

## 2019-09-08 DIAGNOSIS — D492 Neoplasm of unspecified behavior of bone, soft tissue, and skin: Secondary | ICD-10-CM

## 2019-09-08 DIAGNOSIS — M109 Gout, unspecified: Secondary | ICD-10-CM | POA: Diagnosis not present

## 2019-09-08 DIAGNOSIS — Z538 Procedure and treatment not carried out for other reasons: Secondary | ICD-10-CM | POA: Diagnosis present

## 2019-09-08 DIAGNOSIS — Z7902 Long term (current) use of antithrombotics/antiplatelets: Secondary | ICD-10-CM

## 2019-09-08 DIAGNOSIS — Z951 Presence of aortocoronary bypass graft: Secondary | ICD-10-CM

## 2019-09-08 DIAGNOSIS — I517 Cardiomegaly: Secondary | ICD-10-CM | POA: Diagnosis not present

## 2019-09-08 DIAGNOSIS — E669 Obesity, unspecified: Secondary | ICD-10-CM | POA: Diagnosis not present

## 2019-09-08 DIAGNOSIS — R0600 Dyspnea, unspecified: Secondary | ICD-10-CM | POA: Diagnosis not present

## 2019-09-08 DIAGNOSIS — I330 Acute and subacute infective endocarditis: Secondary | ICD-10-CM | POA: Diagnosis present

## 2019-09-08 DIAGNOSIS — S22070S Wedge compression fracture of T9-T10 vertebra, sequela: Secondary | ICD-10-CM | POA: Diagnosis not present

## 2019-09-08 DIAGNOSIS — Z4682 Encounter for fitting and adjustment of non-vascular catheter: Secondary | ICD-10-CM | POA: Diagnosis not present

## 2019-09-08 DIAGNOSIS — I083 Combined rheumatic disorders of mitral, aortic and tricuspid valves: Secondary | ICD-10-CM | POA: Diagnosis not present

## 2019-09-08 DIAGNOSIS — K611 Rectal abscess: Secondary | ICD-10-CM | POA: Diagnosis not present

## 2019-09-08 DIAGNOSIS — M19072 Primary osteoarthritis, left ankle and foot: Secondary | ICD-10-CM | POA: Diagnosis not present

## 2019-09-08 DIAGNOSIS — K029 Dental caries, unspecified: Secondary | ICD-10-CM | POA: Diagnosis present

## 2019-09-08 DIAGNOSIS — R0609 Other forms of dyspnea: Secondary | ICD-10-CM | POA: Diagnosis not present

## 2019-09-08 DIAGNOSIS — E559 Vitamin D deficiency, unspecified: Secondary | ICD-10-CM | POA: Diagnosis not present

## 2019-09-08 DIAGNOSIS — I9751 Accidental puncture and laceration of a circulatory system organ or structure during a circulatory system procedure: Secondary | ICD-10-CM | POA: Diagnosis not present

## 2019-09-08 DIAGNOSIS — K083 Retained dental root: Secondary | ICD-10-CM | POA: Diagnosis present

## 2019-09-08 DIAGNOSIS — R197 Diarrhea, unspecified: Secondary | ICD-10-CM | POA: Diagnosis present

## 2019-09-08 DIAGNOSIS — B955 Unspecified streptococcus as the cause of diseases classified elsewhere: Secondary | ICD-10-CM | POA: Diagnosis present

## 2019-09-08 DIAGNOSIS — T826XXA Infection and inflammatory reaction due to cardiac valve prosthesis, initial encounter: Secondary | ICD-10-CM | POA: Diagnosis not present

## 2019-09-08 DIAGNOSIS — D696 Thrombocytopenia, unspecified: Secondary | ICD-10-CM | POA: Diagnosis not present

## 2019-09-08 DIAGNOSIS — I10 Essential (primary) hypertension: Secondary | ICD-10-CM | POA: Diagnosis present

## 2019-09-08 DIAGNOSIS — L0231 Cutaneous abscess of buttock: Secondary | ICD-10-CM | POA: Diagnosis not present

## 2019-09-08 DIAGNOSIS — I440 Atrioventricular block, first degree: Secondary | ICD-10-CM | POA: Diagnosis not present

## 2019-09-08 DIAGNOSIS — R011 Cardiac murmur, unspecified: Secondary | ICD-10-CM | POA: Diagnosis not present

## 2019-09-08 DIAGNOSIS — S22079A Unspecified fracture of T9-T10 vertebra, initial encounter for closed fracture: Secondary | ICD-10-CM | POA: Diagnosis not present

## 2019-09-08 DIAGNOSIS — J939 Pneumothorax, unspecified: Secondary | ICD-10-CM

## 2019-09-08 DIAGNOSIS — M549 Dorsalgia, unspecified: Secondary | ICD-10-CM | POA: Diagnosis not present

## 2019-09-08 DIAGNOSIS — K068 Other specified disorders of gingiva and edentulous alveolar ridge: Secondary | ICD-10-CM | POA: Diagnosis not present

## 2019-09-08 DIAGNOSIS — R7982 Elevated C-reactive protein (CRP): Secondary | ICD-10-CM | POA: Diagnosis not present

## 2019-09-08 DIAGNOSIS — K91840 Postprocedural hemorrhage and hematoma of a digestive system organ or structure following a digestive system procedure: Secondary | ICD-10-CM | POA: Diagnosis not present

## 2019-09-08 DIAGNOSIS — B9689 Other specified bacterial agents as the cause of diseases classified elsewhere: Secondary | ICD-10-CM | POA: Diagnosis present

## 2019-09-08 DIAGNOSIS — Z79899 Other long term (current) drug therapy: Secondary | ICD-10-CM

## 2019-09-08 DIAGNOSIS — I2510 Atherosclerotic heart disease of native coronary artery without angina pectoris: Secondary | ICD-10-CM | POA: Diagnosis not present

## 2019-09-08 DIAGNOSIS — Y838 Other surgical procedures as the cause of abnormal reaction of the patient, or of later complication, without mention of misadventure at the time of the procedure: Secondary | ICD-10-CM | POA: Diagnosis not present

## 2019-09-08 DIAGNOSIS — E8770 Fluid overload, unspecified: Secondary | ICD-10-CM | POA: Diagnosis not present

## 2019-09-08 DIAGNOSIS — I712 Thoracic aortic aneurysm, without rupture: Secondary | ICD-10-CM | POA: Diagnosis not present

## 2019-09-08 DIAGNOSIS — I358 Other nonrheumatic aortic valve disorders: Secondary | ICD-10-CM | POA: Diagnosis not present

## 2019-09-08 DIAGNOSIS — K5900 Constipation, unspecified: Secondary | ICD-10-CM | POA: Diagnosis not present

## 2019-09-08 DIAGNOSIS — N170 Acute kidney failure with tubular necrosis: Secondary | ICD-10-CM | POA: Diagnosis not present

## 2019-09-08 DIAGNOSIS — I310 Chronic adhesive pericarditis: Secondary | ICD-10-CM | POA: Diagnosis present

## 2019-09-08 DIAGNOSIS — J181 Lobar pneumonia, unspecified organism: Secondary | ICD-10-CM | POA: Diagnosis not present

## 2019-09-08 DIAGNOSIS — C9030 Solitary plasmacytoma not having achieved remission: Secondary | ICD-10-CM | POA: Diagnosis not present

## 2019-09-08 DIAGNOSIS — I4510 Unspecified right bundle-branch block: Secondary | ICD-10-CM | POA: Diagnosis not present

## 2019-09-08 DIAGNOSIS — K0530 Chronic periodontitis, unspecified: Secondary | ICD-10-CM | POA: Diagnosis present

## 2019-09-08 DIAGNOSIS — M4804 Spinal stenosis, thoracic region: Secondary | ICD-10-CM | POA: Diagnosis not present

## 2019-09-08 DIAGNOSIS — Z833 Family history of diabetes mellitus: Secondary | ICD-10-CM

## 2019-09-08 DIAGNOSIS — I071 Rheumatic tricuspid insufficiency: Secondary | ICD-10-CM | POA: Diagnosis present

## 2019-09-08 DIAGNOSIS — R9389 Abnormal findings on diagnostic imaging of other specified body structures: Secondary | ICD-10-CM | POA: Diagnosis not present

## 2019-09-08 DIAGNOSIS — B954 Other streptococcus as the cause of diseases classified elsewhere: Secondary | ICD-10-CM | POA: Diagnosis not present

## 2019-09-08 DIAGNOSIS — Z888 Allergy status to other drugs, medicaments and biological substances status: Secondary | ICD-10-CM

## 2019-09-08 DIAGNOSIS — D649 Anemia, unspecified: Secondary | ICD-10-CM | POA: Diagnosis not present

## 2019-09-08 DIAGNOSIS — J90 Pleural effusion, not elsewhere classified: Secondary | ICD-10-CM

## 2019-09-08 DIAGNOSIS — Z01810 Encounter for preprocedural cardiovascular examination: Secondary | ICD-10-CM | POA: Diagnosis not present

## 2019-09-08 DIAGNOSIS — E785 Hyperlipidemia, unspecified: Secondary | ICD-10-CM | POA: Diagnosis not present

## 2019-09-08 DIAGNOSIS — M8448XA Pathological fracture, other site, initial encounter for fracture: Secondary | ICD-10-CM | POA: Diagnosis not present

## 2019-09-08 DIAGNOSIS — Z955 Presence of coronary angioplasty implant and graft: Secondary | ICD-10-CM

## 2019-09-08 DIAGNOSIS — Z20822 Contact with and (suspected) exposure to covid-19: Secondary | ICD-10-CM | POA: Diagnosis not present

## 2019-09-08 DIAGNOSIS — E1165 Type 2 diabetes mellitus with hyperglycemia: Secondary | ICD-10-CM | POA: Diagnosis present

## 2019-09-08 DIAGNOSIS — D638 Anemia in other chronic diseases classified elsewhere: Secondary | ICD-10-CM | POA: Diagnosis not present

## 2019-09-08 DIAGNOSIS — R112 Nausea with vomiting, unspecified: Secondary | ICD-10-CM | POA: Diagnosis present

## 2019-09-08 DIAGNOSIS — I4891 Unspecified atrial fibrillation: Secondary | ICD-10-CM | POA: Diagnosis not present

## 2019-09-08 DIAGNOSIS — E7800 Pure hypercholesterolemia, unspecified: Secondary | ICD-10-CM | POA: Diagnosis not present

## 2019-09-08 DIAGNOSIS — C412 Malignant neoplasm of vertebral column: Secondary | ICD-10-CM | POA: Diagnosis not present

## 2019-09-08 DIAGNOSIS — M898X8 Other specified disorders of bone, other site: Secondary | ICD-10-CM | POA: Diagnosis not present

## 2019-09-08 DIAGNOSIS — I272 Pulmonary hypertension, unspecified: Secondary | ICD-10-CM | POA: Diagnosis present

## 2019-09-08 DIAGNOSIS — I2541 Coronary artery aneurysm: Secondary | ICD-10-CM | POA: Diagnosis present

## 2019-09-08 DIAGNOSIS — I25119 Atherosclerotic heart disease of native coronary artery with unspecified angina pectoris: Secondary | ICD-10-CM | POA: Diagnosis present

## 2019-09-08 DIAGNOSIS — J9811 Atelectasis: Secondary | ICD-10-CM | POA: Diagnosis not present

## 2019-09-08 DIAGNOSIS — I110 Hypertensive heart disease with heart failure: Secondary | ICD-10-CM | POA: Diagnosis not present

## 2019-09-08 DIAGNOSIS — I5042 Chronic combined systolic (congestive) and diastolic (congestive) heart failure: Secondary | ICD-10-CM | POA: Diagnosis not present

## 2019-09-08 DIAGNOSIS — D509 Iron deficiency anemia, unspecified: Secondary | ICD-10-CM | POA: Diagnosis present

## 2019-09-08 DIAGNOSIS — I38 Endocarditis, valve unspecified: Secondary | ICD-10-CM | POA: Diagnosis present

## 2019-09-08 DIAGNOSIS — I509 Heart failure, unspecified: Secondary | ICD-10-CM | POA: Diagnosis not present

## 2019-09-08 DIAGNOSIS — R897 Abnormal histological findings in specimens from other organs, systems and tissues: Secondary | ICD-10-CM | POA: Diagnosis not present

## 2019-09-08 DIAGNOSIS — Z79890 Hormone replacement therapy (postmenopausal): Secondary | ICD-10-CM

## 2019-09-08 DIAGNOSIS — Z7984 Long term (current) use of oral hypoglycemic drugs: Secondary | ICD-10-CM

## 2019-09-08 DIAGNOSIS — Z452 Encounter for adjustment and management of vascular access device: Secondary | ICD-10-CM | POA: Diagnosis not present

## 2019-09-08 DIAGNOSIS — R451 Restlessness and agitation: Secondary | ICD-10-CM | POA: Diagnosis not present

## 2019-09-08 DIAGNOSIS — B359 Dermatophytosis, unspecified: Secondary | ICD-10-CM | POA: Diagnosis not present

## 2019-09-08 DIAGNOSIS — T82855A Stenosis of coronary artery stent, initial encounter: Secondary | ICD-10-CM | POA: Diagnosis present

## 2019-09-08 DIAGNOSIS — I253 Aneurysm of heart: Secondary | ICD-10-CM | POA: Diagnosis not present

## 2019-09-08 DIAGNOSIS — Z7901 Long term (current) use of anticoagulants: Secondary | ICD-10-CM

## 2019-09-08 DIAGNOSIS — I361 Nonrheumatic tricuspid (valve) insufficiency: Secondary | ICD-10-CM | POA: Diagnosis not present

## 2019-09-08 DIAGNOSIS — Z8249 Family history of ischemic heart disease and other diseases of the circulatory system: Secondary | ICD-10-CM

## 2019-09-08 DIAGNOSIS — R0602 Shortness of breath: Secondary | ICD-10-CM | POA: Diagnosis not present

## 2019-09-08 DIAGNOSIS — T82858A Stenosis of vascular prosthetic devices, implants and grafts, initial encounter: Secondary | ICD-10-CM | POA: Diagnosis present

## 2019-09-08 DIAGNOSIS — E1122 Type 2 diabetes mellitus with diabetic chronic kidney disease: Secondary | ICD-10-CM | POA: Diagnosis present

## 2019-09-08 DIAGNOSIS — Z8673 Personal history of transient ischemic attack (TIA), and cerebral infarction without residual deficits: Secondary | ICD-10-CM

## 2019-09-08 DIAGNOSIS — I5043 Acute on chronic combined systolic (congestive) and diastolic (congestive) heart failure: Secondary | ICD-10-CM | POA: Diagnosis not present

## 2019-09-08 LAB — BRAIN NATRIURETIC PEPTIDE: B Natriuretic Peptide: 535.2 pg/mL — ABNORMAL HIGH (ref 0.0–100.0)

## 2019-09-08 LAB — GLUCOSE, CAPILLARY: Glucose-Capillary: 151 mg/dL — ABNORMAL HIGH (ref 70–99)

## 2019-09-08 LAB — CBC
HCT: 30.5 % — ABNORMAL LOW (ref 39.0–52.0)
Hemoglobin: 10.4 g/dL — ABNORMAL LOW (ref 13.0–17.0)
MCH: 28.3 pg (ref 26.0–34.0)
MCHC: 34.1 g/dL (ref 30.0–36.0)
MCV: 83.1 fL (ref 80.0–100.0)
Platelets: 206 10*3/uL (ref 150–400)
RBC: 3.67 MIL/uL — ABNORMAL LOW (ref 4.22–5.81)
RDW: 14.5 % (ref 11.5–15.5)
WBC: 10.3 10*3/uL (ref 4.0–10.5)
nRBC: 0 % (ref 0.0–0.2)

## 2019-09-08 LAB — BASIC METABOLIC PANEL
Anion gap: 11 (ref 5–15)
BUN: 17 mg/dL (ref 6–20)
CO2: 21 mmol/L — ABNORMAL LOW (ref 22–32)
Calcium: 8.7 mg/dL — ABNORMAL LOW (ref 8.9–10.3)
Chloride: 104 mmol/L (ref 98–111)
Creatinine, Ser: 1.31 mg/dL — ABNORMAL HIGH (ref 0.61–1.24)
GFR calc Af Amer: >60 mL/min (ref >60)
GFR calc non Af Amer: >60 mL/min (ref >60)
Glucose, Bld: 153 mg/dL — ABNORMAL HIGH (ref 70–99)
Potassium: 3.7 mmol/L (ref 3.5–5.1)
Sodium: 136 mmol/L (ref 135–145)

## 2019-09-08 LAB — IRON AND TIBC
Iron: 26 ug/dL — ABNORMAL LOW (ref 45–182)
Saturation Ratios: 13 % — ABNORMAL LOW (ref 17.9–39.5)
TIBC: 203 ug/dL — ABNORMAL LOW (ref 250–450)
UIBC: 177 ug/dL

## 2019-09-08 LAB — PROTIME-INR
INR: 2.3 — ABNORMAL HIGH (ref 0.8–1.2)
Prothrombin Time: 25.5 seconds — ABNORMAL HIGH (ref 11.4–15.2)

## 2019-09-08 LAB — MAGNESIUM: Magnesium: 1.6 mg/dL — ABNORMAL LOW (ref 1.7–2.4)

## 2019-09-08 LAB — TROPONIN I (HIGH SENSITIVITY)
Troponin I (High Sensitivity): 34 ng/L — ABNORMAL HIGH (ref <18)
Troponin I (High Sensitivity): 28 ng/L — ABNORMAL HIGH (ref <18)

## 2019-09-08 LAB — POC SARS CORONAVIRUS 2 AG -  ED: SARS Coronavirus 2 Ag: NEGATIVE

## 2019-09-08 LAB — TSH: TSH: 2.673 u[IU]/mL (ref 0.350–4.500)

## 2019-09-08 LAB — HEMOGLOBIN A1C
Hgb A1c MFr Bld: 8.2 % — ABNORMAL HIGH (ref 4.8–5.6)
Mean Plasma Glucose: 188.64 mg/dL

## 2019-09-08 LAB — SARS CORONAVIRUS 2 (TAT 6-24 HRS): SARS Coronavirus 2: NEGATIVE

## 2019-09-08 LAB — HIV ANTIBODY (ROUTINE TESTING W REFLEX): HIV Screen 4th Generation wRfx: NONREACTIVE

## 2019-09-08 LAB — FERRITIN: Ferritin: 59 ng/mL (ref 24–336)

## 2019-09-08 IMAGING — DX DG CHEST 2V
2 series · 2 of 2 positions shown · non-contrast
Comparison: 10/21/2017 chest radiograph.

CLINICAL DATA: Dyspnea, nausea, vomiting

EXAM:
CHEST - 2 VIEW

[chest pa]
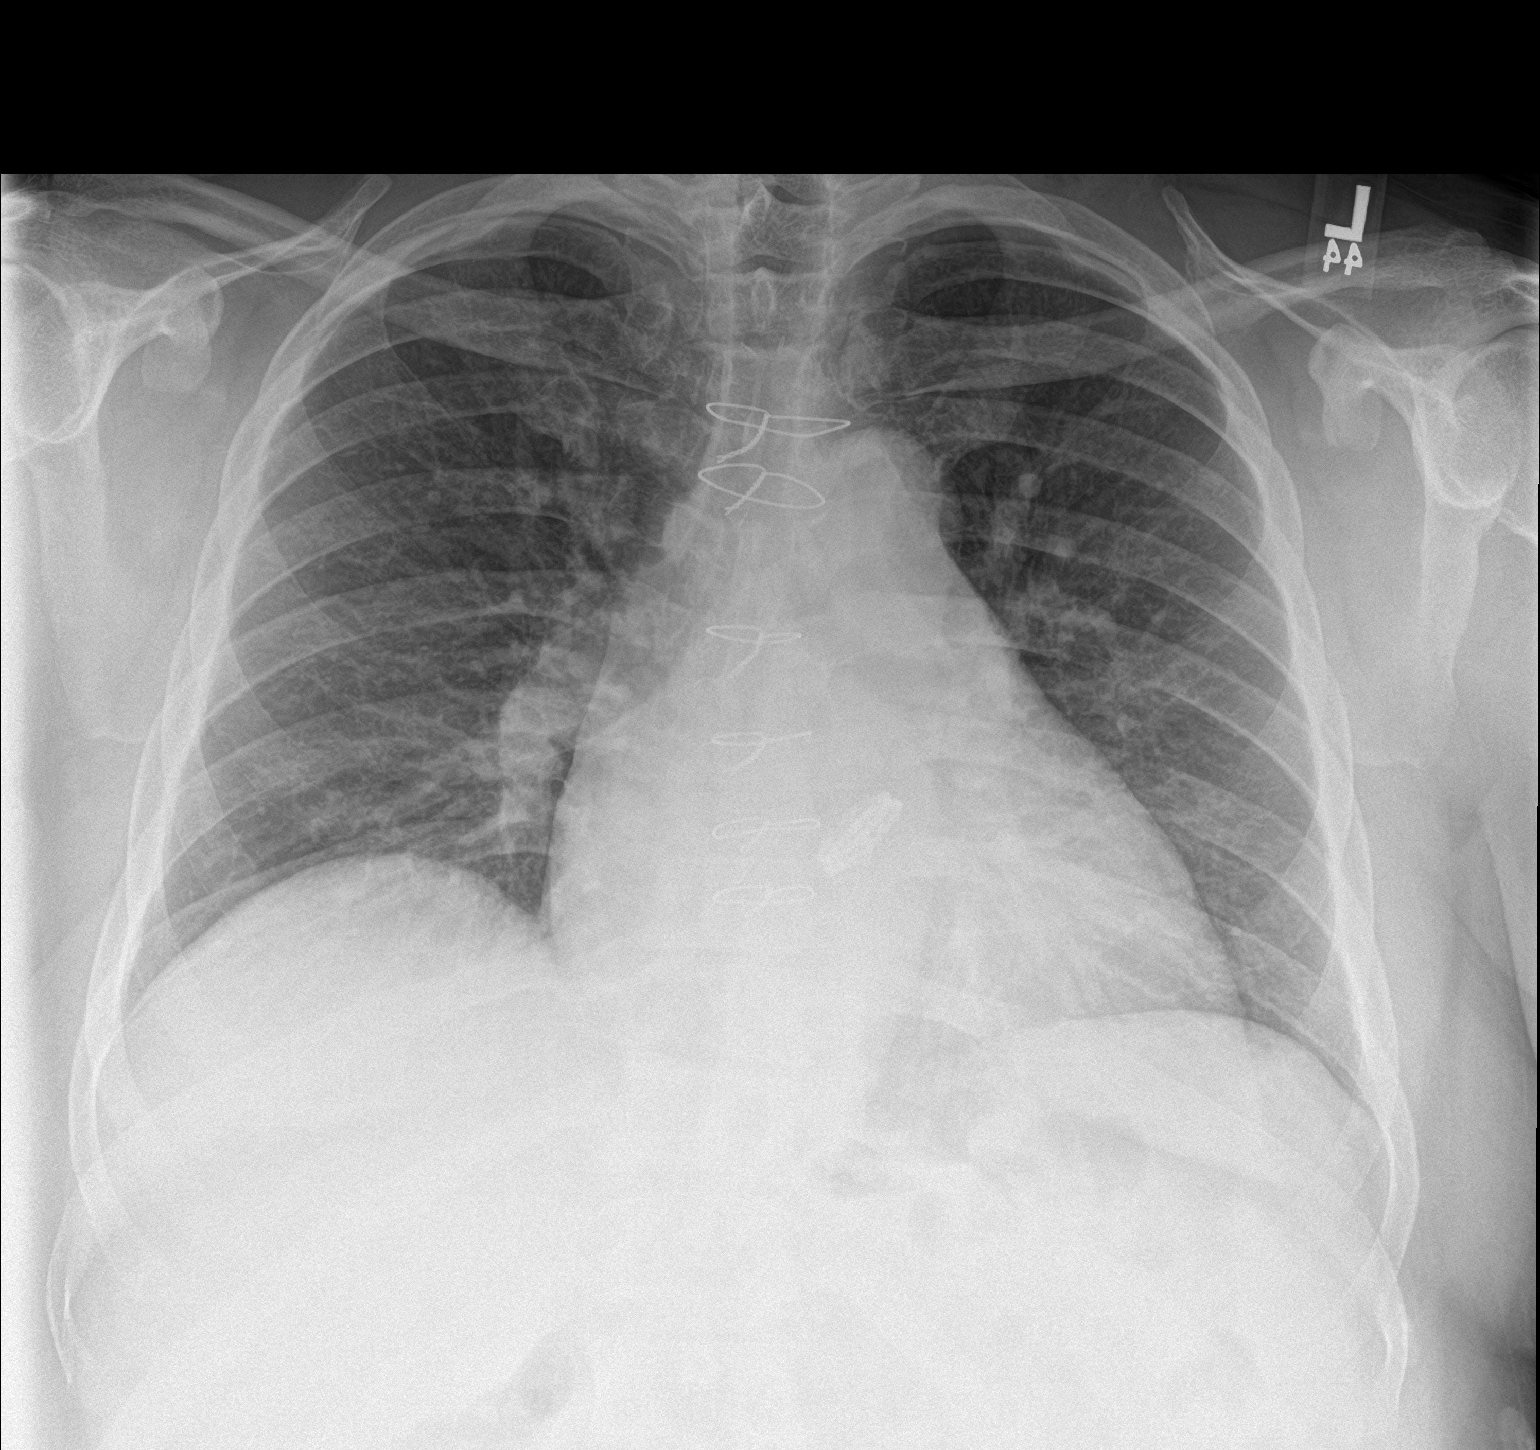

[chest lat]
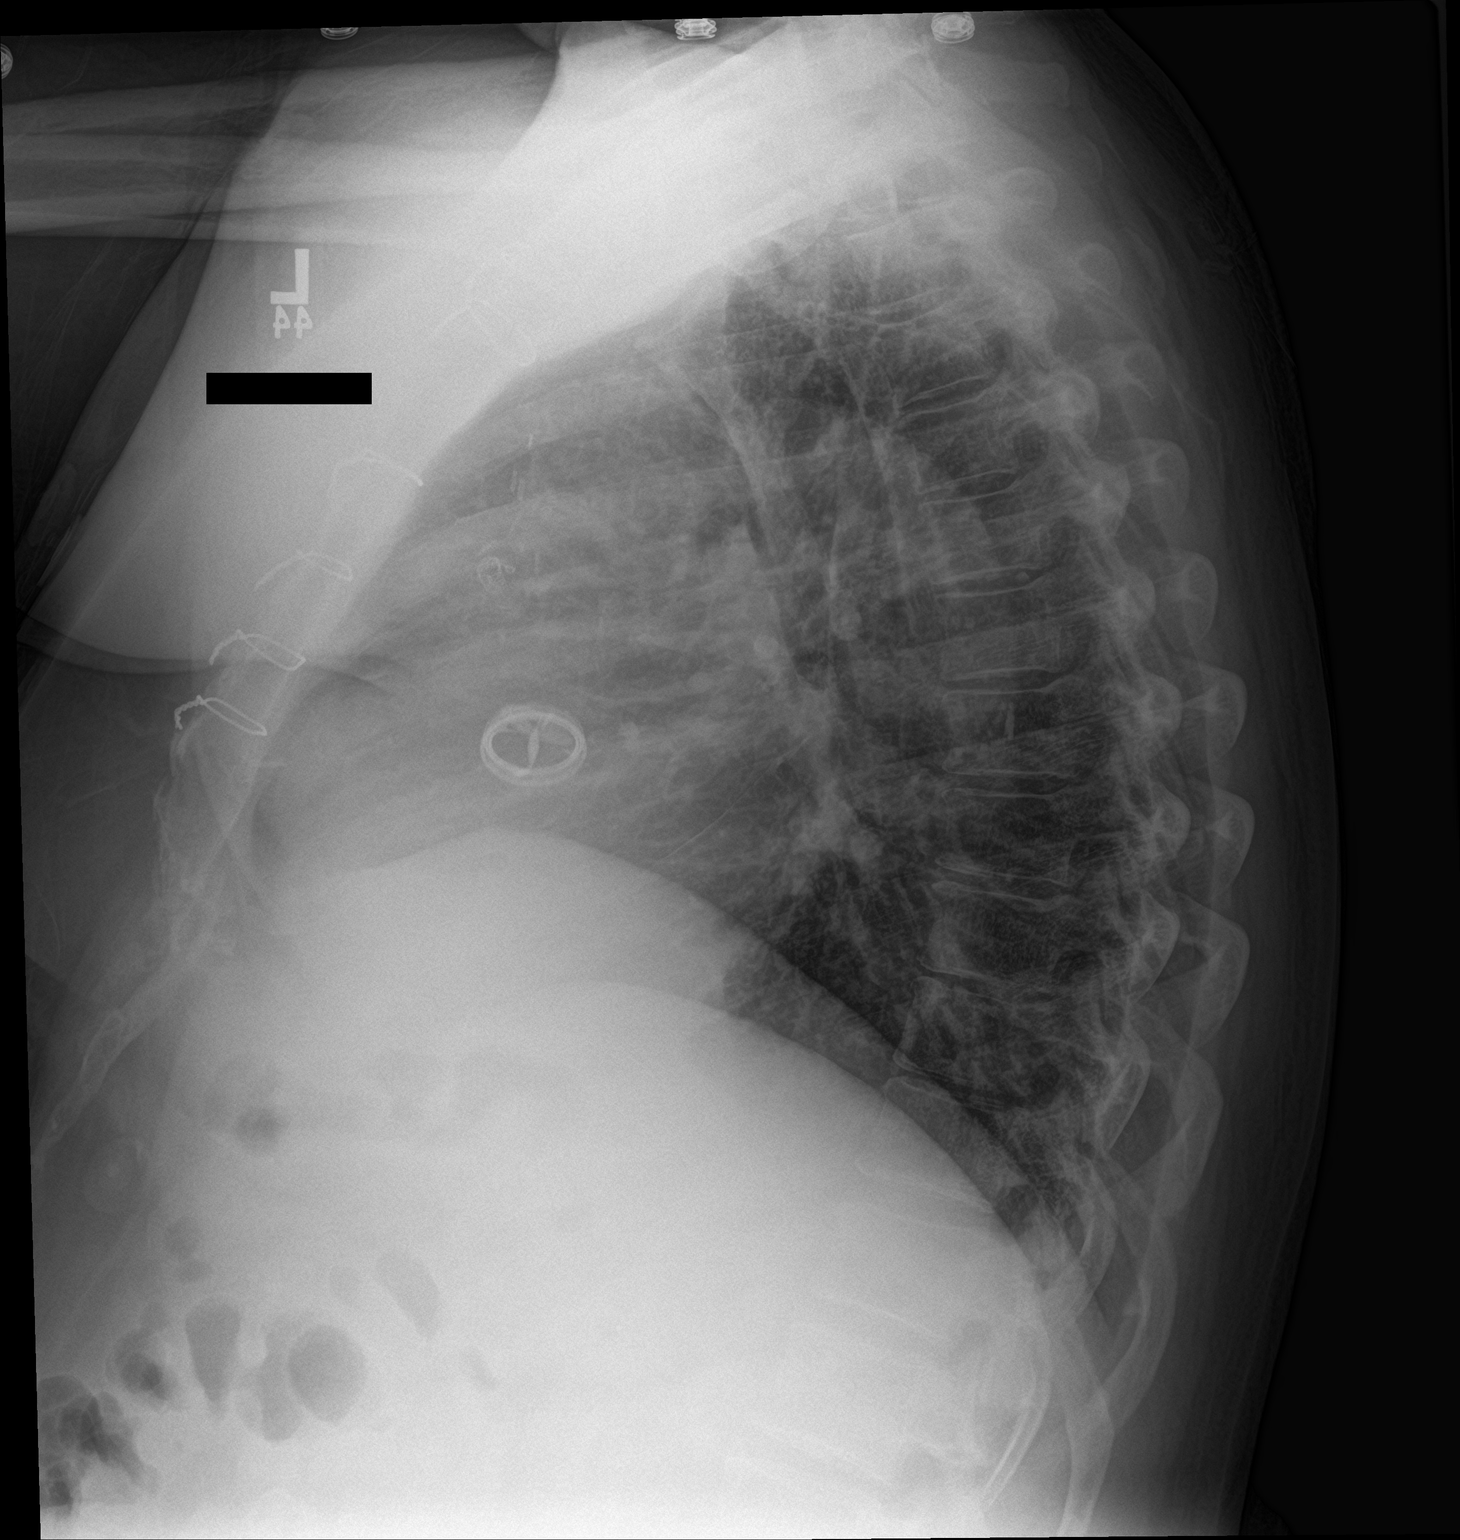

[2 of 2 positions shown; findings below may reference images not displayed]

FINDINGS: Intact sternotomy wires. Aortic valve prosthesis is in place. Stable
cardiomediastinal silhouette with mild cardiomegaly. No
pneumothorax. No pleural effusion. Lungs appear clear, with no acute
consolidative airspace disease and no pulmonary edema.
IMPRESSION: Stable mild cardiomegaly without pulmonary edema. No active
pulmonary disease.

## 2019-09-08 MED ORDER — ATORVASTATIN CALCIUM 40 MG PO TABS
40.0000 mg | ORAL_TABLET | Freq: Every day | ORAL | Status: DC
  Administered 2019-09-09 – 2019-10-07 (×26): 40 mg via ORAL
  Filled 2019-09-08 (×28): qty 1

## 2019-09-08 MED ORDER — WARFARIN SODIUM 7.5 MG PO TABS
7.5000 mg | ORAL_TABLET | Freq: Once | ORAL | Status: AC
  Administered 2019-09-08: 7.5 mg via ORAL
  Filled 2019-09-08: qty 1

## 2019-09-08 MED ORDER — ONDANSETRON HCL 4 MG/2ML IJ SOLN: 4.0000 mg | Freq: Four times a day (QID) | INTRAMUSCULAR | Status: DC | PRN

## 2019-09-08 MED ORDER — ACETAMINOPHEN 650 MG RE SUPP: 650.0000 mg | Freq: Four times a day (QID) | RECTAL | Status: DC | PRN

## 2019-09-08 MED ORDER — INSULIN ASPART 100 UNIT/ML ~~LOC~~ SOLN
0.0000 [IU] | Freq: Three times a day (TID) | SUBCUTANEOUS | Status: DC
  Administered 2019-09-09: 8 [IU] via SUBCUTANEOUS
  Administered 2019-09-09 – 2019-09-10 (×2): 3 [IU] via SUBCUTANEOUS
  Administered 2019-09-10: 5 [IU] via SUBCUTANEOUS
  Administered 2019-09-10: 8 [IU] via SUBCUTANEOUS

## 2019-09-08 MED ORDER — LOSARTAN POTASSIUM 50 MG PO TABS
100.0000 mg | ORAL_TABLET | Freq: Every day | ORAL | Status: DC
  Administered 2019-09-09: 100 mg via ORAL
  Filled 2019-09-08: qty 2

## 2019-09-08 MED ORDER — FUROSEMIDE 40 MG PO TABS
40.0000 mg | ORAL_TABLET | Freq: Two times a day (BID) | ORAL | Status: DC
  Administered 2019-09-08 – 2019-09-09 (×3): 40 mg via ORAL
  Filled 2019-09-08 (×4): qty 1

## 2019-09-08 MED ORDER — METOPROLOL SUCCINATE ER 100 MG PO TB24
200.0000 mg | ORAL_TABLET | Freq: Every day | ORAL | Status: DC
  Administered 2019-09-09: 200 mg via ORAL
  Filled 2019-09-08: qty 2

## 2019-09-08 MED ORDER — CLOPIDOGREL BISULFATE 75 MG PO TABS
75.0000 mg | ORAL_TABLET | Freq: Every day | ORAL | Status: DC
  Administered 2019-09-08 – 2019-09-09 (×2): 75 mg via ORAL
  Filled 2019-09-08 (×3): qty 1

## 2019-09-08 MED ORDER — INSULIN ASPART 100 UNIT/ML ~~LOC~~ SOLN
0.0000 [IU] | Freq: Every day | SUBCUTANEOUS | Status: DC
  Administered 2019-09-09: 2 [IU] via SUBCUTANEOUS

## 2019-09-08 MED ORDER — ONDANSETRON HCL 4 MG PO TABS: 4.0000 mg | ORAL_TABLET | Freq: Four times a day (QID) | ORAL | Status: DC | PRN

## 2019-09-08 MED ORDER — WARFARIN - PHARMACIST DOSING INPATIENT: Freq: Every day | Status: DC

## 2019-09-08 MED ORDER — LEVOTHYROXINE SODIUM 50 MCG PO TABS
50.0000 ug | ORAL_TABLET | Freq: Every day | ORAL | Status: DC
  Administered 2019-09-09 – 2019-10-07 (×27): 50 ug via ORAL
  Filled 2019-09-08 (×28): qty 1

## 2019-09-08 MED ORDER — NITROGLYCERIN 0.4 MG SL SUBL: 0.4000 mg | SUBLINGUAL_TABLET | SUBLINGUAL | Status: DC | PRN

## 2019-09-08 MED ORDER — GABAPENTIN 300 MG PO CAPS
300.0000 mg | ORAL_CAPSULE | Freq: Every day | ORAL | Status: DC
  Administered 2019-09-09 – 2019-10-07 (×27): 300 mg via ORAL
  Filled 2019-09-08 (×27): qty 1

## 2019-09-08 MED ORDER — SODIUM CHLORIDE 0.9% FLUSH: 3.0000 mL | Freq: Once | INTRAVENOUS | Status: DC

## 2019-09-08 MED ORDER — ACETAMINOPHEN 325 MG PO TABS
650.0000 mg | ORAL_TABLET | Freq: Four times a day (QID) | ORAL | Status: DC | PRN
  Administered 2019-09-08 – 2019-09-19 (×6): 650 mg via ORAL
  Filled 2019-09-08 (×8): qty 2

## 2019-09-08 NOTE — ED Triage Notes (Signed)
C/o SOB (worse with exertion), nausea, vomiting, diarrhea, and headache x 1 week.

## 2019-09-08 NOTE — ED Provider Notes (Signed)
[facility] EMERGENCY DEPARTMENT Provider Note   CSN: [id number] Arrival date & time: 09/08/19  [id number]     History Chief Complaint  Patient presents with  . Shortness of Breath  . Emesis    [name] is a 54 y.o. male.  Patient with history of coronary artery disease status post CABG, stenting, history of ascending aortic dissection status post repair with mechanical valve, paroxysmal A. fib on Coumadin, diabetes, HFpEF on furosemide--presents to the emergency department today with complaint of progressive shortness of breath with exertion over the past 1 week or so.  In addition patient reports vomiting and watery diarrhea.  He states that walking upstairs or at work he becomes short winded, more so than typical.  He is able to lie flat and denies any new or worsening leg swelling. He denies any chest pains or diaphoresis.  Nausea and vomiting of been occurring most days, but not every day, over the past week.  Diarrhea is watery and nonbloody.  He has had an occasional headache.  No fevers or sick contacts.  Patient does not voice missing any medications.  He did have cardiology tele visit 3 days ago. No SOB, V/D, reported per that note.         Past Medical History:  Diagnosis Date  . Ascending aortic dissection ([place])    a. 2000 - with aortic valve involvement. S/p repair of aortic dissection with placement of mechanical AVR.  [name] CAD (coronary artery disease)    a. 2000 VG->PDA @ time of Ao dissection repair;  b. Cardiac CT 06/2010: no CAD, only mild plaque in prox RCA;  c. 2015 Cath: LM nl, LAD nl, LCX nl, RCA 100, VG->PDA 90 (4.5x12 Rebel BMS). d. 10/23/17 instent restenosis SVG to RCA, DES placed.  . Cholelithiasis 08/22/2013  . Chronic diastolic CHF (congestive heart failure) ([place])    a. 03/2016 Echo: EF 55-60%, no rwma, Gr2 DD.  . Diabetes mellitus   . Dyspnea   . Gross hematuria   . H/O mechanical aortic valve replacement    a. 03/2016 Echo: AoV mean  gradient 85mmHg, Valve Area (VTI) 1.36 cm^2, (Vmax) 1.22 cm^2, mod dil LA.  [name] Hyperlipidemia   . Hypertension   . NEPHROLITHIASIS, HX OF   . Obesity   . PAF (paroxysmal atrial fibrillation) ([place])    a. H/o such, with recurrence of coarse afib/flutter during ER consult 03/2012.  . Stroke ([place])   . TRANSIENT ISCHEMIC ATTACK, HX OF    a. In 2004.  [name] Unspecified vitamin D deficiency     Patient Active Problem List   Diagnosis Date Noted  . Chronic anticoagulation 04/02/2019  . Wellness examination 09/28/2018  . Lumbar disc herniation 06/06/2018  . Coronary artery disease involving native heart without angina pectoris   . Low back pain 03/07/2018  . Obesity (BMI 30-39.9) 01/15/2018  . Acute on chronic systolic congestive heart failure ([place])   . Status post coronary artery stent placement   . Dyspnea on exertion 10/22/2017  . Anemia 08/31/2017  . Chronic diastolic CHF (congestive heart failure) ([place]) 03/30/2016  . Screen for colon cancer 09/03/2015  . PTSD (post-traumatic stress disorder) 10/31/2013  . Depressive disorder 10/31/2013  . Hallucinations 10/31/2013  . Cholelithiasis 08/22/2013  . Hypothyroidism 08/22/2013  . Encounter for therapeutic drug monitoring 08/07/2013  . Unstable angina ([place]) 07/25/2013  . CAD of artery bypass graft-  07/24/2013  . Hematoma of groin 07/24/2013  . Retroperitoneal bleed Nov 2014 07/24/2013  .  Acute diastolic heart failure ([place]) 07/21/2013  . Ascending aortic dissection  in 2000- s/p [place] and MDT tilting disc AVR   . PAF (paroxysmal atrial fibrillation) ([place])   . Bladder neck obstruction 05/04/2012  . Unspecified vitamin D deficiency 08/07/2008  . Gross hematuria 08/07/2008  . FATIGUE 12/26/2007  . Hyperlipidemia 08/20/2007  . Diabetes mellitus type II, non insulin dependent ([place]) 05/21/2007  . Essential hypertension 05/21/2007  . NEPHROLITHIASIS, HX OF 05/21/2007    Past Surgical History:  Procedure Laterality Date  . AORTIC VALVE  REPLACEMENT  2000  . APPENDECTOMY  1998  . CORONARY ARTERY BYPASS GRAFT  2000  . CORONARY STENT INTERVENTION N/A 10/23/2017   Procedure: CORONARY STENT INTERVENTION;  Surgeon: [name], MD;  Location: [facility] LAB;  Service: Cardiovascular;  Laterality: N/A;  . LEFT AND RIGHT HEART CATHETERIZATION WITH CORONARY ANGIOGRAM N/A 07/19/2013   Procedure: LEFT AND RIGHT HEART CATHETERIZATION WITH CORONARY ANGIOGRAM;  Surgeon: [name], MD;  Location: [facility] CATH LAB;  Service: Cardiovascular;  Laterality: N/A;  . LUMBAR LAMINECTOMY/DECOMPRESSION MICRODISCECTOMY Right 06/06/2018   Procedure: Right L3-4 disectomy;  Surgeon: Melina Schools, MD;  Location: [facility];  Service: Orthopedics;  Laterality: Right;  2.5 hrs  . RIGHT HEART CATH AND CORONARY/GRAFT ANGIOGRAPHY N/A 10/23/2017   Procedure: RIGHT HEART CATH AND CORONARY/GRAFT ANGIOGRAPHY;  Surgeon: [name], MD;  Location: [facility] LAB;  Service: Cardiovascular;  Laterality: N/A;  . TEE WITHOUT CARDIOVERSION N/A 07/22/2013   Procedure: TRANSESOPHAGEAL ECHOCARDIOGRAM (TEE);  Surgeon: [name], MD;  Location: [facility] ENDOSCOPY;  Service: Cardiovascular;  Laterality: N/A;       Family History  Problem Relation Age of Onset  . Coronary artery disease Mother   . Hypertension Mother   . Diabetes Brother   . Coronary artery disease Other 24       male 1st degree relative, CABG in his 2's (father)  . Cancer Father        ? lung    Social History   Tobacco Use  . Smoking status: Never Smoker  . Smokeless tobacco: Never Used  Substance Use Topics  . Alcohol use: No    Alcohol/week: 0.0 standard drinks  . Drug use: No    Home Medications Prior to Admission medications   Medication Sig Start Date End Date Taking? Authorizing Provider  amLODipine (NORVASC) 10 MG tablet Take 1 tablet (10 mg total) by mouth daily. 09/05/19   Erlene Quan, PA-C  atorvastatin (LIPITOR) 40 MG tablet TAKE 1 TABLET EVERY DAY  AT  6   PM 08/26/19   Biagio Borg, MD  Blood Glucose Monitoring Suppl (TRUE METRIX AIR GLUCOSE METER) DEVI 1 Device by Does not apply route daily. E11.9 08/26/19   Biagio Borg, MD  cholecalciferol (VITAMIN D) 1000 UNITS tablet Take 1,000 Units by mouth daily.    [provider]  clopidogrel (PLAVIX) 75 MG tablet Take 1 tablet (75 mg total) by mouth daily. 09/05/19   Erlene Quan, PA-C  furosemide (LASIX) 40 MG tablet Take 1 tablet (40 mg total) by mouth 2 (two) times daily. 09/05/19   Lelon Perla, MD  gabapentin (NEURONTIN) 300 MG capsule Take 1 capsule (300 mg total) by mouth 3 (three) times daily. 01/09/19   Jaynee Eagles, PA-C  glimepiride (AMARYL) 1 MG tablet TAKE 3 TABLETS ONE TIME DAILY WITH BREAKFAST Patient taking differently: Take 3 mg by mouth daily with breakfast.  03/14/17   Cathlean Cower  W, MD  glucose blood (TRUE METRIX BLOOD GLUCOSE TEST) test strip Use as directed once daily 08/26/19   Biagio Borg, MD  levothyroxine (SYNTHROID) 50 MCG tablet TAKE 1 TABLET EVERY DAY 06/07/19   Biagio Borg, MD  losartan (COZAAR) 100 MG tablet Take 1 tablet (100 mg total) by mouth daily. 08/21/19   Biagio Borg, MD  metFORMIN (GLUCOPHAGE) 1000 MG tablet Take 1 tablet (1,000 mg total) by mouth 2 (two) times daily with a meal. 09/28/18   Biagio Borg, MD  metoprolol (TOPROL-XL) 200 MG 24 hr tablet Take 1 tablet (200 mg total) by mouth daily. Take with or immediately following a meal. 09/05/19   Kilroy, Doreene Burke, PA-C  nitroGLYCERIN (NITROSTAT) 0.4 MG SL tablet Place 1 tablet (0.4 mg total) under the tongue every 5 (five) minutes x 3 doses as needed for chest pain. 07/25/13   Erlene Quan, PA-C  sildenafil (REVATIO) 20 MG tablet TAKE 2-5 TABLETS BY MOUTH 30 MINUTES PRIOR TO INTERCOURSE 11/15/18   Biagio Borg, MD  Vitamin D, Ergocalciferol, (DRISDOL) 1.25 MG (50000 UT) CAPS capsule Take 1 capsule (50,000 Units total) by mouth every 7 (seven) days. 04/02/19   Biagio Borg, MD  warfarin (COUMADIN) 5 MG tablet  TAKE 1 TO 1 AND 1/2 TABLETS EVERY DAY AS DIRECTED BY COUMADIN CLINIC 08/28/19   Lelon Perla, MD  enoxaparin (LOVENOX) 120 MG/0.8ML injection Inject 0.8 mLs (120 mg total) into the skin every 12 (twelve) hours. 06/19/18 01/09/19  Lelon Perla, MD    Allergies    Diltiazem and Insulin glargine  Review of Systems   Review of Systems  Constitutional: Negative for diaphoresis and fever.  Eyes: Negative for redness.  Respiratory: Positive for shortness of breath. Negative for cough.   Cardiovascular: Negative for chest pain, palpitations and leg swelling.  Gastrointestinal: Positive for diarrhea, nausea and vomiting. Negative for abdominal pain.  Genitourinary: Negative for dysuria.  Musculoskeletal: Negative for back pain and neck pain.  Skin: Negative for rash.  Neurological: Negative for syncope and light-headedness.  Psychiatric/Behavioral: The patient is not nervous/anxious.     Physical Exam Updated Vital Signs BP 130/66 (BP Location: Left Arm)   Pulse 77   Temp 100.1 F (37.8 C) (Oral)   Resp 18   Ht 6\' 2"  (1.88 m)   Wt 129.3 kg   SpO2 97%   BMI 36.59 kg/m   Physical Exam Vitals and nursing note reviewed.  Constitutional:      Appearance: He is well-developed. He is not diaphoretic.  HENT:     Head: Normocephalic and atraumatic.     Mouth/Throat:     Mouth: Mucous membranes are not dry.  Eyes:     Conjunctiva/sclera: Conjunctivae normal.  Neck:     Vascular: Normal carotid pulses. No carotid bruit or JVD.     Trachea: Trachea normal. No tracheal deviation.  Cardiovascular:     Rate and Rhythm: Normal rate and regular rhythm.     Pulses: No decreased pulses.     Heart sounds: S1 normal and S2 normal. Heart sounds not distant. Murmur present.     Comments: Mechanical aortic heart valve easily heard without stethoscope.  Patient does have a systolic murmur as well. Pulmonary:     Effort: Pulmonary effort is normal. No respiratory distress.     Breath sounds:  Normal breath sounds. No wheezing.  Chest:     Chest wall: No tenderness.  Abdominal:  General: Bowel sounds are normal.     Palpations: Abdomen is soft.     Tenderness: There is no abdominal tenderness. There is no guarding or rebound.  Musculoskeletal:     Cervical back: Normal range of motion and neck supple. No muscular tenderness.     Right lower leg: No tenderness. No edema.     Left lower leg: No tenderness. No edema.     Comments: Patient reports left posterior rib pain worse with palpation.  No external signs.  Skin:    General: Skin is warm and dry.     Coloration: Skin is not pale.  Neurological:     Mental Status: He is alert.     ED Results / Procedures / Treatments   Labs (all labs ordered are listed, but only abnormal results are displayed) Labs Reviewed  BASIC METABOLIC PANEL - Abnormal; Notable for the following components:      Result Value   CO2 21 (*)    Glucose, Bld 153 (*)    Creatinine, Ser 1.31 (*)    Calcium 8.7 (*)    All other components within normal limits  CBC - Abnormal; Notable for the following components:   RBC 3.67 (*)    Hemoglobin 10.4 (*)    HCT 30.5 (*)    All other components within normal limits  PROTIME-INR - Abnormal; Notable for the following components:   Prothrombin Time 25.5 (*)    INR 2.3 (*)    All other components within normal limits  BRAIN NATRIURETIC PEPTIDE - Abnormal; Notable for the following components:   B Natriuretic Peptide 535.2 (*)    All other components within normal limits  TROPONIN I (HIGH SENSITIVITY) - Abnormal; Notable for the following components:   Troponin I (High Sensitivity) 34 (*)    All other components within normal limits  TROPONIN I (HIGH SENSITIVITY) - Abnormal; Notable for the following components:   Troponin I (High Sensitivity) 28 (*)    All other components within normal limits  SARS CORONAVIRUS 2 (TAT 6-24 HRS)  POC SARS CORONAVIRUS 2 AG -  ED    EKG EKG  Interpretation  Date/Time:  Sunday September 08 2019 07:45:53 EDT Ventricular Rate:  83 PR Interval:  214 QRS Duration: 102 QT Interval:  416 QTC Calculation: 488 R Axis:   9 Text Interpretation: Sinus rhythm with 1st degree A-V block Possible Inferior infarct , age undetermined Abnormal ECG nonspecific T waves. Confirmed by Sherwood Gambler [phone]) on 09/08/2019 10:27:51 AM   Radiology DG Chest 2 View  Result Date: 09/08/2019 CLINICAL DATA:  Dyspnea, nausea, vomiting EXAM: CHEST - 2 VIEW COMPARISON:  10/21/2017 chest radiograph. FINDINGS: Intact sternotomy wires. Aortic valve prosthesis is in place. Stable cardiomediastinal silhouette with mild cardiomegaly. No pneumothorax. No pleural effusion. Lungs appear clear, with no acute consolidative airspace disease and no pulmonary edema. IMPRESSION: Stable mild cardiomegaly without pulmonary edema. No active pulmonary disease. Electronically Signed   By: Ilona Sorrel M.D.   On: 09/08/2019 11:11    Procedures Procedures (including critical care time)  Medications Ordered in ED Medications  sodium chloride flush (NS) 0.9 % injection 3 mL (has no administration in time range)    ED Course  I have reviewed the triage vital signs and the nursing notes.  Pertinent labs & imaging results that were available during my care of the patient were reviewed by me and considered in my medical decision making (see chart for details).  Patient seen and examined. Work-up  initiated.  Discussed with Dr. Regenia Skeeter who will see.  Vital signs reviewed and are as follows: BP 130/66 (BP Location: Left Arm)   Pulse 78   Temp 100.1 F (37.8 C) (Oral)   Resp 20   Ht 6\' 2"  (1.88 m)   Wt 129.3 kg   SpO2 100%   BMI 36.59 kg/m   Work-up overall reassuring.  BNP is slightly elevated from previous results.  Troponin flat at 34 > 28.  Patient has ambulated without oxygen desaturation but reports shortness of breath.  Patient rechecked.  He states that he gets so  short of breath when he walks he feels like he is going to pass out.  He is a bit more tachypneic now than he was earlier.  Patient discussed with and seen by Dr. Regenia Skeeter.   We will discuss with cardiology given the patient's complex past history.  Unclear if this is a mild CHF exacerbation versus anginal equivalent.  I have spoken with Dr. Percival Spanish.  They will consult on patient and provide recommendations and further advice regarding evaluation and management.  Patient was updated on plan.  His current complaint is that he is hungry.  Will allow patient to eat.  Signout to [employer] at shift change.  Currently awaiting cardiology recommendations.  Plan: Dispo after cardiology reccs.    MDM Rules/Calculators/A&P                      Patient with worsening shortness of breath with exertion in setting of CAD and heart failure.  Also has mechanical valve (slightly subtherapeutic INR today) and history of paroxysmal A. fib but is in normal sinus rhythm.  Patient has had some reported intermittent vomiting and diarrhea.  No vomiting at the current time.  Mild troponin leak, flat trend.  BNP elevated compared to previous.  Given complex past medical history, will take cardiology recommendations.  Symptomatically he is worse than his labs and chest x-ray at this time.   Final Clinical Impression(s) / ED Diagnoses Final diagnoses:  Dyspnea on exertion  Chronic combined systolic and diastolic congestive heart failure [facility])    Rx / DC Orders ED Discharge Orders    None       Carlisle Cater, PA-C 09/08/19 [place], MD 09/10/19 [id number]

## 2019-09-08 NOTE — H&P (Addendum)
History and Physical    [name] [id number] DOB: 03-20-1966 DOA: 09/08/2019  PCP: Biagio Borg, MD  Patient coming from: Home  I have personally briefly reviewed patient's old medical records in [place]  Chief Complaint: N/V/D and shortness of breath  HPI: [name] is a 54 y.o. male with medical history significant of hypertension, hyperlipidemia, paroxysmal A. fib on Coumadin, TIA in 2004, diabetes mellitus, coronary artery disease status post CABG in 2000, ascending aortic dissection in 2000 status post repair of aortic dissection with replacement of mechanical AVR presents to emergency department due to nausea, vomiting and diarrhea since 5 days.  He tells me that 5 days ago he woke up with chills and 2 days later he had a headache.  Reports dark green vomitus once a day, nonbloody, not associated with food intake.  Has 4-5 episodes of watery diarrhea, green in color, nonbloody, denies association with abdominal pain however reports decreased appetite.  Reports shortness of breath with exertion and some shortness of breath while lying on right side, denies association with leg swelling, chest pain, palpitation, lightheadedness or dizziness.  He takes Coumadin for A. fib and he is compliant with it.  Reports daytime sleepiness especially while driving.  No history of OSA.  He lives with his wife.  No urinary symptoms.  No history of smoking, alcohol, illicit drug use.  ED Course: Upon arrival to ED: Patient's vital signs stable, BP: 130/66, pulse: 78, temperature: 100, respiratory rate, 20s, maintaining oxygen saturation of 100% on room air.  CBC shows no leukocytosis.  H&H: 10.4/30.5.  BNP: 535, troponin: 34 trended down to 28.  INR: 2.3.  COVID-19 negative, chest x-ray shows stable mild cardiomegaly without pulmonary edema.  EDP consulted cardiology.  Review of Systems: As per HPI otherwise negative.    Past Medical History:  Diagnosis Date  . Ascending  aortic dissection ([employer])    a. 2000 - with aortic valve involvement. S/p repair of aortic dissection with placement of mechanical AVR.  [name] CAD (coronary artery disease)    a. 2000 VG->PDA @ time of Ao dissection repair;  b. Cardiac CT 06/2010: no CAD, only mild plaque in prox RCA;  c. 2015 Cath: LM nl, LAD nl, LCX nl, RCA 100, VG->PDA 90 (4.5x12 Rebel BMS). d. 10/23/17 instent restenosis SVG to RCA, DES placed.  . Cholelithiasis 08/22/2013  . Chronic diastolic CHF (congestive heart failure) ([place])    a. 03/2016 Echo: EF 55-60%, no rwma, Gr2 DD.  . Diabetes mellitus   . Dyspnea   . Gross hematuria   . H/O mechanical aortic valve replacement    a. 03/2016 Echo: AoV mean gradient 86mmHg, Valve Area (VTI) 1.36 cm^2, (Vmax) 1.22 cm^2, mod dil LA.  [name] Hyperlipidemia   . Hypertension   . NEPHROLITHIASIS, HX OF   . Obesity   . PAF (paroxysmal atrial fibrillation) ([place])    a. H/o such, with recurrence of coarse afib/flutter during ER consult 03/2012.  . Stroke ([place])   . TRANSIENT ISCHEMIC ATTACK, HX OF    a. In 2004.  [name] Unspecified vitamin D deficiency     Past Surgical History:  Procedure Laterality Date  . AORTIC VALVE REPLACEMENT  2000  . APPENDECTOMY  1998  . CORONARY ARTERY BYPASS GRAFT  2000  . CORONARY STENT INTERVENTION N/A 10/23/2017   Procedure: CORONARY STENT INTERVENTION;  Surgeon: [name], MD;  Location: [facility];  Service: Cardiovascular;  Laterality: N/A;  . LEFT AND RIGHT  HEART CATHETERIZATION WITH CORONARY ANGIOGRAM N/A 07/19/2013   Procedure: LEFT AND RIGHT HEART CATHETERIZATION WITH CORONARY ANGIOGRAM;  Surgeon: [name], MD;  Location: [facility] CATH LAB;  Service: Cardiovascular;  Laterality: N/A;  . LUMBAR LAMINECTOMY/DECOMPRESSION MICRODISCECTOMY Right 06/06/2018   Procedure: Right L3-4 disectomy;  Surgeon: Melina Schools, MD;  Location: [facility];  Service: Orthopedics;  Laterality: Right;  2.5 hrs  . RIGHT HEART CATH AND CORONARY/GRAFT ANGIOGRAPHY N/A  10/23/2017   Procedure: RIGHT HEART CATH AND CORONARY/GRAFT ANGIOGRAPHY;  Surgeon: [name], MD;  Location: [place] [facility];  Service: Cardiovascular;  Laterality: N/A;  . TEE WITHOUT CARDIOVERSION N/A 07/22/2013   Procedure: TRANSESOPHAGEAL ECHOCARDIOGRAM (TEE);  Surgeon: [name], MD;  Location: [facility] ENDOSCOPY;  Service: Cardiovascular;  Laterality: N/A;     reports that he has never smoked. He has never used smokeless tobacco. He reports that he does not drink alcohol or use drugs.  Allergies  Allergen Reactions  . Diltiazem Hives  . Insulin Glargine Swelling    edema    Family History  Problem Relation Age of Onset  . Coronary artery disease Mother   . Hypertension Mother   . Diabetes Brother   . Coronary artery disease Other 82       male 1st degree relative, CABG in his 89's (father)  . Cancer Father        ? lung    Prior to Admission medications   Medication Sig Start Date End Date Taking? Authorizing Provider  amLODipine (NORVASC) 10 MG tablet Take 1 tablet (10 mg total) by mouth daily. 09/05/19   Erlene Quan, PA-C  atorvastatin (LIPITOR) 40 MG tablet TAKE 1 TABLET EVERY DAY  AT  6  PM 08/26/19   Biagio Borg, MD  Blood Glucose Monitoring Suppl (TRUE METRIX AIR GLUCOSE METER) DEVI 1 Device by Does not apply route daily. E11.9 08/26/19   Biagio Borg, MD  cholecalciferol (VITAMIN D) 1000 UNITS tablet Take 1,000 Units by mouth daily.    [provider]  clopidogrel (PLAVIX) 75 MG tablet Take 1 tablet (75 mg total) by mouth daily. 09/05/19   Erlene Quan, PA-C  furosemide (LASIX) 40 MG tablet Take 1 tablet (40 mg total) by mouth 2 (two) times daily. 09/05/19   Lelon Perla, MD  gabapentin (NEURONTIN) 300 MG capsule Take 1 capsule (300 mg total) by mouth 3 (three) times daily. 01/09/19   Jaynee Eagles, PA-C  glimepiride (AMARYL) 1 MG tablet TAKE 3 TABLETS ONE TIME DAILY WITH BREAKFAST Patient taking differently: Take 3 mg by mouth daily with  breakfast.  03/14/17   Biagio Borg, MD  glucose blood (TRUE METRIX BLOOD GLUCOSE TEST) test strip Use as directed once daily 08/26/19   Biagio Borg, MD  levothyroxine (SYNTHROID) 50 MCG tablet TAKE 1 TABLET EVERY DAY 06/07/19   Biagio Borg, MD  losartan (COZAAR) 100 MG tablet Take 1 tablet (100 mg total) by mouth daily. 08/21/19   Biagio Borg, MD  metFORMIN (GLUCOPHAGE) 1000 MG tablet Take 1 tablet (1,000 mg total) by mouth 2 (two) times daily with a meal. 09/28/18   Biagio Borg, MD  metoprolol (TOPROL-XL) 200 MG 24 hr tablet Take 1 tablet (200 mg total) by mouth daily. Take with or immediately following a meal. 09/05/19   Kilroy, Doreene Burke, PA-C  nitroGLYCERIN (NITROSTAT) 0.4 MG SL tablet Place 1 tablet (0.4 mg total) under the tongue every 5 (five) minutes x 3 doses  as needed for chest pain. 07/25/13   Erlene Quan, PA-C  sildenafil (REVATIO) 20 MG tablet TAKE 2-5 TABLETS BY MOUTH 30 MINUTES PRIOR TO INTERCOURSE 11/15/18   Biagio Borg, MD  Vitamin D, Ergocalciferol, (DRISDOL) 1.25 MG (50000 UT) CAPS capsule Take 1 capsule (50,000 Units total) by mouth every 7 (seven) days. 04/02/19   Biagio Borg, MD  warfarin (COUMADIN) 5 MG tablet TAKE 1 TO 1 AND 1/2 TABLETS EVERY DAY AS DIRECTED BY COUMADIN CLINIC 08/28/19   Lelon Perla, MD  enoxaparin (LOVENOX) 120 MG/0.8ML injection Inject 0.8 mLs (120 mg total) into the skin every 12 (twelve) hours. 06/19/18 01/09/19  Lelon Perla, MD    Physical Exam: Vitals:   09/08/19 1445 09/08/19 1506 09/08/19 1612 09/08/19 1630  BP:    (!) 148/94  Pulse: 78  80 79  Resp: (!) 34  (!) 26 20  Temp:  99.2 F (37.3 C)    TempSrc:  Oral    SpO2: 99%  100% 100%  Weight:      Height:        Constitutional: NAD, calm, comfortable, communicating well, on room air Eyes: PERRL, lids and conjunctivae normal ENMT: Mucous membranes are moist. Posterior pharynx clear of any exudate or lesions.Normal dentition.  Neck: normal, supple, no masses, no  thyromegaly Respiratory: clear to auscultation bilaterally, no wheezing, no crackles. Normal respiratory effort. No accessory muscle use.  Cardiovascular: Regular rate and rhythm, midsystolic click/ rubs / gallops. No extremity edema. 2+ pedal pulses. No carotid bruits.  Abdomen: no tenderness, no masses palpated. No hepatosplenomegaly. Bowel sounds positive.  Musculoskeletal: no clubbing / cyanosis. No joint deformity upper and lower extremities. Good ROM, no contractures. Normal muscle tone.  Skin: no rashes, lesions, ulcers. No induration Neurologic: CN 2-12 grossly intact. Sensation intact, DTR normal. Strength 5/5 in all 4.  Psychiatric: Normal judgment and insight. Alert and oriented x 3. Normal mood.    Labs on Admission: I have personally reviewed following labs and imaging studies  CBC: Recent Labs  Lab 09/08/19 0810  WBC 10.3  HGB 10.4*  HCT 30.5*  MCV 83.1  PLT 99991111   Basic Metabolic Panel: Recent Labs  Lab 09/08/19 0810  NA 136  K 3.7  CL 104  CO2 21*  GLUCOSE 153*  BUN 17  CREATININE 1.31*  CALCIUM 8.7*   GFR: Estimated Creatinine Clearance: 92.1 mL/min (A) (by C-G formula based on SCr of 1.31 mg/dL (H)). Liver Function Tests: No results for input(s): AST, ALT, ALKPHOS, BILITOT, PROT, ALBUMIN in the last 168 hours. No results for input(s): LIPASE, AMYLASE in the last 168 hours. No results for input(s): AMMONIA in the last 168 hours. Coagulation Profile: Recent Labs  Lab 09/06/19 0917 09/08/19 1100  INR 2.3 2.3*   Cardiac Enzymes: No results for input(s): CKTOTAL, CKMB, CKMBINDEX, TROPONINI in the last 168 hours. BNP (last 3 results) No results for input(s): PROBNP in the last 8760 hours. HbA1C: No results for input(s): HGBA1C in the last 72 hours. CBG: No results for input(s): GLUCAP in the last 168 hours. Lipid Profile: No results for input(s): CHOL, HDL, LDLCALC, TRIG, CHOLHDL, LDLDIRECT in the last 72 hours. Thyroid Function Tests: No results  for input(s): TSH, T4TOTAL, FREET4, T3FREE, THYROIDAB in the last 72 hours. Anemia Panel: No results for input(s): VITAMINB12, FOLATE, FERRITIN, TIBC, IRON, RETICCTPCT in the last 72 hours. Urine analysis:    Component Value Date/Time   COLORURINE YELLOW 08/31/2017 [place]  08/31/2017 0906   LABSPEC <=1.005 (A) 08/31/2017 0906   PHURINE 6.0 08/31/2017 0906   GLUCOSEU 100 (A) 08/31/2017 0906   HGBUR NEGATIVE 08/31/2017 0906   BILIRUBINUR NEGATIVE 08/31/2017 0906   KETONESUR NEGATIVE 08/31/2017 0906   PROTEINUR 30 (A) 12/31/2009 1606   UROBILINOGEN 1.0 08/31/2017 0906   NITRITE NEGATIVE 08/31/2017 0906   LEUKOCYTESUR NEGATIVE 08/31/2017 0906    Radiological Exams on Admission: DG Chest 2 View  Result Date: 09/08/2019 CLINICAL DATA:  Dyspnea, nausea, vomiting EXAM: CHEST - 2 VIEW COMPARISON:  10/21/2017 chest radiograph. FINDINGS: Intact sternotomy wires. Aortic valve prosthesis is in place. Stable cardiomediastinal silhouette with mild cardiomegaly. No pneumothorax. No pleural effusion. Lungs appear clear, with no acute consolidative airspace disease and no pulmonary edema. IMPRESSION: Stable mild cardiomegaly without pulmonary edema. No active pulmonary disease. Electronically Signed   By: Ilona Sorrel M.D.   On: 09/08/2019 11:11    EKG: Independently reviewed.  Sinus rhythm with first-degree AV block.  No ST-T wave changes.  Assessment/Plan Principal Problem:   Shortness of breath Active Problems:   Diabetes mellitus type II, non insulin dependent (HCC)   Hyperlipidemia   Essential hypertension   PAF (paroxysmal atrial fibrillation) (HCC)   Hypothyroidism   Normocytic anemia   Obesity (BMI 30-39.9)   CAD (coronary artery disease)   Nausea vomiting and diarrhea    Nausea, vomiting, diarrhea: -Could be secondary to acute viral gastroenteritis.  Patient's vital signs stable.  His temperature was noted to be 100.1 in ED.  No leukocytosis.  COVID-19  negative -Admit patient on the floor for close monitoring.   -Zofran as needed for nausea and vomiting -We will get GI pathogen panel and UA. -Monitor vitals closely  Exertional shortness of breath: Patient is euvolemic on exam -BNP: 535, chest x-ray negative for pulmonary congestion -Reviewed previous echo -Repeat echo is ordered by cardiology and is pending.  Check TSH. -Strict INO's and daily weight.  Check magnesium level -Continue Lasix 40 p.o. twice daily  Diabetes mellitus: Check A1c -Hold Metformin and glimepiride for now and started patient on sliding scale insulin.  Hypertension: Stable -Continue amlodipine, metoprolol, losartan  Paroxysmal A. Fib: -Continue metoprolol.  Coumadin per pharmacy. On telemetry.  Hypothyroidism: Check TSH -Continue Synthyroid  Coronary artery disease status post CABG: -Patient denies any ACS symptoms.  Troponin: 34 trended down to 28.  Reviewed EKG.  Trend troponins.  Nitro as needed for pain control. -Continue statin, Plavix, metoprolol, losartan -Appreciate cardiology input  Ascending aortic dissection in 2000 status post repair of aortic dissection with placement of mechanical AVR: -Continue Coumadin per pharmacy.  INR: 2.3 today.  Hyperlipidemia: Continue statin  Increased daytime sleepiness: Could be due to underlying OSA. -Recommend outpatient sleep study  Normocytic anemia: H&H: 10.4/30.5 was 11.8/33.87 months ago -No history of melena or over-the-counter use of NSAIDs. -Check iron studies and monitor H&H closely.  Unable to safely start patient's home meds as med reconciliation is pending by pharmacy.  DVT prophylaxis: Coumadin/SCD/TED Code Status: Full code Family Communication: None present at bedside.  Plan of care discussed with patient in length and he verbalized understanding and agreed with it. Disposition Plan: Likely home in 1 to 2 days Consults called: Cardiology by EDP Admission status: Inpatient   Mckinley Jewel MD [facility]  If 7PM-7AM, please contact night-coverage [email or web address] Password [facility]  09/08/2019, 4:53 PM

## 2019-09-08 NOTE — ED Notes (Signed)
Patient ambulated in room with ease, patient states he has shortness of breath but patient oxygen saturation stayed above 97%.

## 2019-09-08 NOTE — Progress Notes (Signed)
ANTICOAGULATION CONSULT NOTE - Initial Consult  Pharmacy Consult for warfarin Indication: atrial fibrillation  Allergies  Allergen Reactions  . Diltiazem Hives  . Insulin Glargine Swelling    edema    Patient Measurements: Height: 6\' 2"  (188 cm) Weight: 129.3 kg (285 lb) IBW/kg (Calculated) : 82.2  Vital Signs: Temp: 99.2 F (37.3 C) (04/18 1506) Temp Source: Oral (04/18 1506) BP: 148/94 (04/18 1630) Pulse Rate: 79 (04/18 1630)  Labs: Recent Labs    09/06/19 0917 09/08/19 0810 09/08/19 1100  HGB  --  10.4*  --   HCT  --  30.5*  --   PLT  --  206  --   LABPROT  --   --  25.5*  INR 2.3  --  2.3*  CREATININE  --  1.31*  --   TROPONINIHS  --  34* 28*    Estimated Creatinine Clearance: 92.1 mL/min (A) (by C-G formula based on SCr of 1.31 mg/dL (H)).   Medical History: Past Medical History:  Diagnosis Date  . Ascending aortic dissection ([place])    a. 2000 - with aortic valve involvement. S/p repair of aortic dissection with placement of mechanical AVR.  [name] CAD (coronary artery disease)    a. 2000 VG->PDA @ time of Ao dissection repair;  b. Cardiac CT 06/2010: no CAD, only mild plaque in prox RCA;  c. 2015 Cath: LM nl, LAD nl, LCX nl, RCA 100, VG->PDA 90 (4.5x12 Rebel BMS). d. 10/23/17 instent restenosis SVG to RCA, DES placed.  . Cholelithiasis 08/22/2013  . Chronic diastolic CHF (congestive heart failure) ([place])    a. 03/2016 Echo: EF 55-60%, no rwma, Gr2 DD.  . Diabetes mellitus   . Dyspnea   . Gross hematuria   . H/O mechanical aortic valve replacement    a. 03/2016 Echo: AoV mean gradient 98mmHg, Valve Area (VTI) 1.36 cm^2, (Vmax) 1.22 cm^2, mod dil LA.  [name] Hyperlipidemia   . Hypertension   . NEPHROLITHIASIS, HX OF   . Obesity   . PAF (paroxysmal atrial fibrillation) ([place])    a. H/o such, with recurrence of coarse afib/flutter during ER consult 03/2012.  . Stroke ([place])   . TRANSIENT ISCHEMIC ATTACK, HX OF    a. In 2004.  [name] Unspecified vitamin D deficiency     Assessment: 13 YOM presenting with SOB and N/V, on warfarin PTA for Afib.  INR therapeutic on admission at 2.3, last dose 4/17.  PTA dosing: 5mg  MWF, 7.5mg  all other days  Goal of Therapy:  INR 2-3 Monitor platelets by anticoagulation protocol: Yes   Plan:  Give warfarin 7.5mg  PO x 1 tonight Daily INR, s/s bleeding  Bertis Ruddy, PharmD Clinical Pharmacist ED Pharmacist Phone # [phone] 09/08/2019 6:33 PM

## 2019-09-08 NOTE — Consult Note (Addendum)
Cardiology Consult   Patient ID: [name] MRN: [id number]; DOB: 1965-11-30   Admission date: 09/08/2019  Primary Care Provider: Biagio Borg, MD Primary Cardiologist: Kirk Ruths, MD  Primary Electrophysiologist:  None   Chief Complaint:  Dyspnea  Patient Profile:   [name] is a 54 y.o. male with history of Bentall procedure in 2000 with CABG, SVG-RCA PCI with BMS in Feb 2015, he had re-do PCI in June 2019 DES to SVG graft, afib on coumadin, TIA in 2004, NIDDM, HTN, and diabetes who is being seen for dyspnea at the request of Carlisle Cater, [place].  History of Present Illness:   [name] is followed by Dr. Steva Colder. He was recently seen in a virtual visit 09/05/19 for routine follow-up. He had back surgery in January 2020 and was having persistent back pain. Patient reported daytime sleepiness and he was set up for sleep study.   The patient presented to the ED 09/08/19 for diarrhea, vomiting, and sob. About 5 days ago the patient woke up with chills. 2 days later he had a headache. He drove to get his mail and got sleepy and went back home. Then the patient started having diarrhea. He was having non-bloody episodes of liquid diarrhea three times a day. He is vomiting also, green-colored he says. He can take his pills in the morning and afternoon and is able to keep those down. Appetite has been decreased. He also feels sob on exertion and some sob while lying on the right side. Patient denies chest pain. He has been taking warfarin. INR on Friday was 2.3.   In the ED BP 130/66, pulse 78, Temp 100, RR 20, 100% O2. Labs show potassium 3.7, glucose 153, BUN 17, creatinine 1.31, WBC 10.3, Hgb 10.4. BNP 535. HS trop 34>28. INR 2.3. COVID negative. CXR with stable mild cardiomegaly without pulmonary edema. No active pulmonary disease. EKG with NSR, first degree AV block with TWI III. Cardiology was consulted.    Past Medical History:  Diagnosis Date  . Ascending aortic  dissection ([place])    a. 2000 - with aortic valve involvement. S/p repair of aortic dissection with placement of mechanical AVR.  [name] CAD (coronary artery disease)    a. 2000 VG->PDA @ time of Ao dissection repair;  b. Cardiac CT 06/2010: no CAD, only mild plaque in prox RCA;  c. 2015 Cath: LM nl, LAD nl, LCX nl, RCA 100, VG->PDA 90 (4.5x12 Rebel BMS). d. 10/23/17 instent restenosis SVG to RCA, DES placed.  . Cholelithiasis 08/22/2013  . Chronic diastolic CHF (congestive heart failure) ([place])    a. 03/2016 Echo: EF 55-60%, no rwma, Gr2 DD.  . Diabetes mellitus   . Dyspnea   . Gross hematuria   . H/O mechanical aortic valve replacement    a. 03/2016 Echo: AoV mean gradient 62mmHg, Valve Area (VTI) 1.36 cm^2, (Vmax) 1.22 cm^2, mod dil LA.  [name] Hyperlipidemia   . Hypertension   . NEPHROLITHIASIS, HX OF   . Obesity   . PAF (paroxysmal atrial fibrillation) ([place])    a. H/o such, with recurrence of coarse afib/flutter during ER consult 03/2012.  . Stroke ([place])   . TRANSIENT ISCHEMIC ATTACK, HX OF    a. In 2004.  [name] Unspecified vitamin D deficiency     Past Surgical History:  Procedure Laterality Date  . AORTIC VALVE REPLACEMENT  2000  . APPENDECTOMY  1998  . CORONARY ARTERY BYPASS GRAFT  2000  . CORONARY STENT INTERVENTION N/A 10/23/2017  Procedure: CORONARY STENT INTERVENTION;  Surgeon: [name], MD;  Location: [facility];  Service: Cardiovascular;  Laterality: N/A;  . LEFT AND RIGHT HEART CATHETERIZATION WITH CORONARY ANGIOGRAM N/A 07/19/2013   Procedure: LEFT AND RIGHT HEART CATHETERIZATION WITH CORONARY ANGIOGRAM;  Surgeon: [name], MD;  Location: [facility] CATH LAB;  Service: Cardiovascular;  Laterality: N/A;  . LUMBAR LAMINECTOMY/DECOMPRESSION MICRODISCECTOMY Right 06/06/2018   Procedure: Right L3-4 disectomy;  Surgeon: Melina Schools, MD;  Location: [facility];  Service: Orthopedics;  Laterality: Right;  2.5 hrs  . RIGHT HEART CATH AND CORONARY/GRAFT ANGIOGRAPHY N/A 10/23/2017    Procedure: RIGHT HEART CATH AND CORONARY/GRAFT ANGIOGRAPHY;  Surgeon: [name], MD;  Location: [facility];  Service: Cardiovascular;  Laterality: N/A;  . TEE WITHOUT CARDIOVERSION N/A 07/22/2013   Procedure: TRANSESOPHAGEAL ECHOCARDIOGRAM (TEE);  Surgeon: [name], MD;  Location: [facility] ENDOSCOPY;  Service: Cardiovascular;  Laterality: N/A;     Medications Prior to Admission: Prior to Admission medications   Medication Sig Start Date End Date Taking? Authorizing Provider  amLODipine (NORVASC) 10 MG tablet Take 1 tablet (10 mg total) by mouth daily. 09/05/19   Erlene Quan, PA-C  atorvastatin (LIPITOR) 40 MG tablet TAKE 1 TABLET EVERY DAY  AT  6  PM 08/26/19   Biagio Borg, MD  Blood Glucose Monitoring Suppl (TRUE METRIX AIR GLUCOSE METER) DEVI 1 Device by Does not apply route daily. E11.9 08/26/19   Biagio Borg, MD  cholecalciferol (VITAMIN D) 1000 UNITS tablet Take 1,000 Units by mouth daily.    [provider]  clopidogrel (PLAVIX) 75 MG tablet Take 1 tablet (75 mg total) by mouth daily. 09/05/19   Erlene Quan, PA-C  furosemide (LASIX) 40 MG tablet Take 1 tablet (40 mg total) by mouth 2 (two) times daily. 09/05/19   Lelon Perla, MD  gabapentin (NEURONTIN) 300 MG capsule Take 1 capsule (300 mg total) by mouth 3 (three) times daily. 01/09/19   Jaynee Eagles, PA-C  glimepiride (AMARYL) 1 MG tablet TAKE 3 TABLETS ONE TIME DAILY WITH BREAKFAST Patient taking differently: Take 3 mg by mouth daily with breakfast.  03/14/17   Biagio Borg, MD  glucose blood (TRUE METRIX BLOOD GLUCOSE TEST) test strip Use as directed once daily 08/26/19   Biagio Borg, MD  levothyroxine (SYNTHROID) 50 MCG tablet TAKE 1 TABLET EVERY DAY 06/07/19   Biagio Borg, MD  losartan (COZAAR) 100 MG tablet Take 1 tablet (100 mg total) by mouth daily. 08/21/19   Biagio Borg, MD  metFORMIN (GLUCOPHAGE) 1000 MG tablet Take 1 tablet (1,000 mg total) by mouth 2 (two) times daily with a meal. 09/28/18    Biagio Borg, MD  metoprolol (TOPROL-XL) 200 MG 24 hr tablet Take 1 tablet (200 mg total) by mouth daily. Take with or immediately following a meal. 09/05/19   Kilroy, Doreene Burke, PA-C  nitroGLYCERIN (NITROSTAT) 0.4 MG SL tablet Place 1 tablet (0.4 mg total) under the tongue every 5 (five) minutes x 3 doses as needed for chest pain. 07/25/13   Erlene Quan, PA-C  sildenafil (REVATIO) 20 MG tablet TAKE 2-5 TABLETS BY MOUTH 30 MINUTES PRIOR TO INTERCOURSE 11/15/18   Biagio Borg, MD  Vitamin D, Ergocalciferol, (DRISDOL) 1.25 MG (50000 UT) CAPS capsule Take 1 capsule (50,000 Units total) by mouth every 7 (seven) days. 04/02/19   Biagio Borg, MD  warfarin (COUMADIN) 5 MG tablet TAKE 1 TO 1 AND  1/2 TABLETS EVERY DAY AS DIRECTED BY COUMADIN CLINIC 08/28/19   Lelon Perla, MD  enoxaparin (LOVENOX) 120 MG/0.8ML injection Inject 0.8 mLs (120 mg total) into the skin every 12 (twelve) hours. 06/19/18 01/09/19  Lelon Perla, MD     Allergies:    Allergies  Allergen Reactions  . Diltiazem Hives  . Insulin Glargine Swelling    edema    Social History:   Social History   Socioeconomic History  . Marital status: Married    Spouse name: Not on file  . Number of children: 4  . Years of education: Not on file  . Highest education level: Not on file  Occupational History  . Occupation: DISABLED    Employer: UNEMPLOYED  Tobacco Use  . Smoking status: Never Smoker  . Smokeless tobacco: Never Used  Substance and Sexual Activity  . Alcohol use: No    Alcohol/week: 0.0 standard drinks  . Drug use: No  . Sexual activity: Not on file  Other Topics Concern  . Not on file  Social History Narrative  . Not on file   Social Determinants of Health   Financial Resource Strain:   . Difficulty of Paying Living Expenses:   Food Insecurity:   . Worried About Charity fundraiser in the Last Year:   . Arboriculturist in the Last Year:   Transportation Needs:   . Film/video editor (Medical):   [name]  Lack of Transportation (Non-Medical):   Physical Activity:   . Days of Exercise per Week:   . Minutes of Exercise per Session:   Stress:   . Feeling of Stress :   Social Connections:   . Frequency of Communication with Friends and Family:   . Frequency of Social Gatherings with Friends and Family:   . Attends Religious Services:   . Active Member of Clubs or Organizations:   . Attends Archivist Meetings:   [name] Marital Status:   Intimate Partner Violence:   . Fear of Current or Ex-Partner:   . Emotionally Abused:   [name] Physically Abused:   . Sexually Abused:     Family History:   The patient's family history includes Cancer in his father; Coronary artery disease in his mother; Coronary artery disease (age of onset: 45) in an other family member; Diabetes in his brother; Hypertension in his mother.    ROS:  Please see the history of present illness.  All other ROS reviewed and negative.     Physical Exam/Data:   Vitals:   09/08/19 1315 09/08/19 1345 09/08/19 1445 09/08/19 1506  BP:      Pulse: 75 73 78   Resp: (!) 28 (!) 30 (!) 34   Temp:    99.2 F (37.3 C)  TempSrc:    Oral  SpO2: 98% 100% 99%   Weight:      Height:       No intake or output data in the 24 hours ending 09/08/19 1511 Last 3 Weights 09/08/2019 09/05/2019 04/02/2019  Weight (lbs) 285 lb 285 lb 14.4 oz 299 lb  Weight (kg) 129.275 kg 129.683 kg 135.626 kg     Body mass index is 36.59 kg/m.  General:  Well nourished, well developed, in no acute distress HEENT: normal Lymph: no adenopathy Neck: no JVD Endocrine:  No thryomegaly Vascular: No carotid bruits; FA pulses 2+ bilaterally without bruits  Cardiac:  normal S1, S2; RRR; + murmur  Lungs:  clear to auscultation bilaterally,  no wheezing, rhonchi or rales  Abd: soft, nontender, no hepatomegaly  Ext: no edema Musculoskeletal:  No deformities, BUE and BLE strength normal and equal Skin: warm and dry  Neuro:  CNs 2-12 intact, no focal  abnormalities noted Psych:  Normal affect    EKG:  The ECG that was done 09/08/19 was personally reviewed and demonstrates  NSR, first degree AV block with TWI III  Relevant CV Studies:  Echo 10/2017 Study Conclusions   - Left ventricle: The cavity size was mildly dilated. There was  moderate concentric hypertrophy. Systolic function was normal.  The estimated ejection fraction was in the range of 55% to 60%.  There is akinesis of the basalinferolateral and inferior  myocardium. The study is not technically sufficient to allow  evaluation of LV diastolic function.  - Aortic valve: A mechanical prosthesis was present. Mean gradient  (S): 21 mm Hg. Valve area (VTI): 1.84 cm^2. Valve area (Vmax):  1.83 cm^2. Valve area (Vmean): 1.92 cm^2.  - Mitral valve: There was mild regurgitation.  - Left atrium: The atrium was mildly dilated.  - Pulmonic valve: There was trivial regurgitation.  - Pulmonary arteries: PA peak pressure: 40 mm Hg (S).   Cardiac Cath 10/2017  Ost RCA lesion is 100% stenosed.  Origin lesion is 99% stenosed, SVG to RCA graft. A drug-eluting stent was successfully placed using a STENT RESOLUTE ONYX 5.0X15, postdilated to 5.5 mm.  Post intervention, there is a 10% residual stenosis.  Prox Cx to Mid Cx lesion is 25% stenosed.  Mid LAD lesion is 25% stenosed.  Hemodynamic findings consistent with mild pulmonary hypertension.  Difficult to engage the SVG to RCA with a guide catheter. Had to place guide near the prior stent and was fortunate to get wire into the vessel, which allowed the guide to be pulled in.  A drug-eluting stent was successfully placed using a STENT RESOLUTE ONYX 5.0X15.   Restart heparin 6 hours post sheath pull.   Recommend to resume Warfarin, at currently prescribed dose and frequency, today, 10/23/2017.  Recommend antiplatelet therapy of Clopidogrel 75mg  QD for 6-12 months.  Aspirin for 30 days.   Brilinta given during the  procedure but would change to Plavix once he is therapeutic on his Coumadin.  Would give a 300 mg loading dose, followed by 75 mg daily.  Coronary Diagrams  Diagnostic Dominance: Right  Intervention     Laboratory Data:  High Sensitivity Troponin:   Recent Labs  Lab 09/08/19 0810 09/08/19 1100  TROPONINIHS 34* 28*      Chemistry Recent Labs  Lab 09/08/19 0810  NA 136  K 3.7  CL 104  CO2 21*  GLUCOSE 153*  BUN 17  CREATININE 1.31*  CALCIUM 8.7*  GFRNONAA >60  GFRAA >60  ANIONGAP 11    No results for input(s): PROT, ALBUMIN, AST, ALT, ALKPHOS, BILITOT in the last 168 hours. Hematology Recent Labs  Lab 09/08/19 0810  WBC 10.3  RBC 3.67*  HGB 10.4*  HCT 30.5*  MCV 83.1  MCH 28.3  MCHC 34.1  RDW 14.5  PLT 206   BNP Recent Labs  Lab 09/08/19 1100  BNP 535.2*    DDimer No results for input(s): DDIMER in the last 168 hours.   Radiology/Studies:  DG Chest 2 View  Result Date: 09/08/2019 CLINICAL DATA:  Dyspnea, nausea, vomiting EXAM: CHEST - 2 VIEW COMPARISON:  10/21/2017 chest radiograph. FINDINGS: Intact sternotomy wires. Aortic valve prosthesis is in place. Stable cardiomediastinal silhouette with mild cardiomegaly.  No pneumothorax. No pleural effusion. Lungs appear clear, with no acute consolidative airspace disease and no pulmonary edema. IMPRESSION: Stable mild cardiomegaly without pulmonary edema. No active pulmonary disease. Electronically Signed   By: Ilona Sorrel M.D.   On: 09/08/2019 11:11     Assessment and Plan:   Vomiting/diarrhea/SOB - Reported with 5 days chills with 3-4 days of vomiting and diarrhea.  He has not had further episodes today. He has been keeping his pills down. - HS trop mildly elevated 34>28 flat is trend and not consistent with ACS. Patient denies chest pain. - BNP also mildly elevated at 535 although patient does not appear fluid overloaded - creatinine 1.31 (baseline of 1.1) patient is ?mildly dehydrated - Will get  echo to check mechanical aortic valve given sob. MD to see  Chronic diastolic HF (EF [id number]) - takes Lasix 40 mg twice daily - euvolemic on exam - repeat echo  Ascending aortic dissection in 2000 s/p Bentall and MDT tilting disc AVR - with aortic valv involvement - s/p repair of aortic dissection with placement of mechanical AVR - INR 2.3 today, pharmacy consult for warfarin dosing  - repeat echo to check valve  CAD s/p CABG and subsequent PCI - SVG to RCA PCI in 2015 and June 2019 with DES to SVG-RCA graft  - takes plavix daily - Patient denies chest pain - troponin mildly elevated with flat trend  Afib - continue coumadin - previous allergic rxn to dilt - INR goal 2.5-3.5 given mechanical aortic valve - patient is in NSR  HTN - losartan 100 mg daily, Toprol-XL 200 mg daily, amlodipine 10 mg daily - pressures stable   HLD - atorvastatin 40 mg daily - LDL 73 03/2019  Daytime sleepiness - OP sleep study has been ordered  For questions or updates, please contact [place] Please consult [email or web address] for contact info under    Signed, Cadence Ninfa Meeker, PA-C  09/08/2019 3:11 PM    Patient seen and examined.  Agree with above documentation. Mr [name] is a 54 year old male with a history of aortic dissection status post Bentall with mechanical AVR and CABG x1 (SVG to RCA) in 2000, SVG- RCA PCI in 2015 and again in 2019, atrial fibrillation on warfarin given mechanical valve (goal INR 2.5-3.5), TIA in 2004, non-insulin-dependent diabetes, hypertension who is being seen for dyspnea at the request of Carlisle Cater, [place].  He reports he was in his usual state of health until 09/03/2019.  States that he awoke that evening with chills.  Did not check his temperature.  Felt okay the next day, but the day following this he began having diarrhea.  States that he was having watery diarrhea 3 times a day for the last 4 days.  He also has been vomiting, describes green-colored  emesis.  States that he has been able to keep his pills down though.  Has not been eating or drinking much.  He denies any chest pain, but does report he is more short of breath than usual.  Has been taking his warfarin, INR 2.3 on Friday.  In the ED, vital signs were stable (BP 130/66, pulse 78, afebrile, SPO2 100% on room air).  Labs notable for creatinine 1.3, hemoglobin 10.4, high-sensitivity troponin 34 than 28, BNP 535.  Chest x-ray shows no pulmonary edema.  EKG shows normal sinus rhythm, first-degree AV block, nonspecific T wave flattening.  On exam, patient is alert and oriented, regular rate and rhythm, mechanical click, 2/6 systolic murmur, lungs  CTAB, no LE edema or JVD.  Unclear cause of his dyspnea.  No chest pain to suggest ACS, and troponins are flat.  BNP is mildly elevated, but does not appear volume overloaded on exam.  Given his mechanical AV valve, will check echo for further evaluation.  Donato Heinz, MD

## 2019-09-09 ENCOUNTER — Inpatient Hospital Stay (HOSPITAL_COMMUNITY): Payer: Medicare PPO

## 2019-09-09 DIAGNOSIS — D649 Anemia, unspecified: Secondary | ICD-10-CM

## 2019-09-09 DIAGNOSIS — I340 Nonrheumatic mitral (valve) insufficiency: Secondary | ICD-10-CM

## 2019-09-09 DIAGNOSIS — I2510 Atherosclerotic heart disease of native coronary artery without angina pectoris: Secondary | ICD-10-CM | POA: Diagnosis not present

## 2019-09-09 DIAGNOSIS — E039 Hypothyroidism, unspecified: Secondary | ICD-10-CM

## 2019-09-09 DIAGNOSIS — R509 Fever, unspecified: Secondary | ICD-10-CM | POA: Diagnosis not present

## 2019-09-09 DIAGNOSIS — R112 Nausea with vomiting, unspecified: Secondary | ICD-10-CM | POA: Diagnosis not present

## 2019-09-09 DIAGNOSIS — E7800 Pure hypercholesterolemia, unspecified: Secondary | ICD-10-CM

## 2019-09-09 DIAGNOSIS — I361 Nonrheumatic tricuspid (valve) insufficiency: Secondary | ICD-10-CM

## 2019-09-09 DIAGNOSIS — I071 Rheumatic tricuspid insufficiency: Secondary | ICD-10-CM

## 2019-09-09 DIAGNOSIS — Z952 Presence of prosthetic heart valve: Secondary | ICD-10-CM

## 2019-09-09 DIAGNOSIS — Z9189 Other specified personal risk factors, not elsewhere classified: Secondary | ICD-10-CM

## 2019-09-09 DIAGNOSIS — Z888 Allergy status to other drugs, medicaments and biological substances status: Secondary | ICD-10-CM

## 2019-09-09 DIAGNOSIS — R197 Diarrhea, unspecified: Secondary | ICD-10-CM | POA: Diagnosis not present

## 2019-09-09 DIAGNOSIS — I10 Essential (primary) hypertension: Secondary | ICD-10-CM

## 2019-09-09 DIAGNOSIS — I330 Acute and subacute infective endocarditis: Secondary | ICD-10-CM | POA: Diagnosis not present

## 2019-09-09 DIAGNOSIS — R0602 Shortness of breath: Secondary | ICD-10-CM | POA: Diagnosis not present

## 2019-09-09 DIAGNOSIS — Z7901 Long term (current) use of anticoagulants: Secondary | ICD-10-CM

## 2019-09-09 DIAGNOSIS — E119 Type 2 diabetes mellitus without complications: Secondary | ICD-10-CM

## 2019-09-09 LAB — ECHOCARDIOGRAM COMPLETE
Height: 74 in
Weight: 4515.02 oz

## 2019-09-09 LAB — COMPREHENSIVE METABOLIC PANEL
ALT: 15 U/L (ref 0–44)
AST: 23 U/L (ref 15–41)
Albumin: 2.5 g/dL — ABNORMAL LOW (ref 3.5–5.0)
Alkaline Phosphatase: 80 U/L (ref 38–126)
Anion gap: 10 (ref 5–15)
BUN: 13 mg/dL (ref 6–20)
CO2: 24 mmol/L (ref 22–32)
Calcium: 8.9 mg/dL (ref 8.9–10.3)
Chloride: 104 mmol/L (ref 98–111)
Creatinine, Ser: 1.11 mg/dL (ref 0.61–1.24)
GFR calc Af Amer: >60 mL/min (ref >60)
GFR calc non Af Amer: >60 mL/min (ref >60)
Glucose, Bld: 187 mg/dL — ABNORMAL HIGH (ref 70–99)
Potassium: 3.9 mmol/L (ref 3.5–5.1)
Sodium: 138 mmol/L (ref 135–145)
Total Bilirubin: 1.1 mg/dL (ref 0.3–1.2)
Total Protein: 7.2 g/dL (ref 6.5–8.1)

## 2019-09-09 LAB — RAPID URINE DRUG SCREEN, HOSP PERFORMED
Amphetamines: NOT DETECTED
Barbiturates: NOT DETECTED
Benzodiazepines: NOT DETECTED
Cocaine: NOT DETECTED
Opiates: NOT DETECTED
Tetrahydrocannabinol: NOT DETECTED

## 2019-09-09 LAB — CBC
HCT: 28.7 % — ABNORMAL LOW (ref 39.0–52.0)
Hemoglobin: 10.2 g/dL — ABNORMAL LOW (ref 13.0–17.0)
MCH: 28.9 pg (ref 26.0–34.0)
MCHC: 35.5 g/dL (ref 30.0–36.0)
MCV: 81.3 fL (ref 80.0–100.0)
Platelets: 194 10*3/uL (ref 150–400)
RBC: 3.53 MIL/uL — ABNORMAL LOW (ref 4.22–5.81)
RDW: 14.4 % (ref 11.5–15.5)
WBC: 7.7 10*3/uL (ref 4.0–10.5)
nRBC: 0 % (ref 0.0–0.2)

## 2019-09-09 LAB — URINALYSIS, ROUTINE W REFLEX MICROSCOPIC
Bacteria, UA: NONE SEEN
Bilirubin Urine: NEGATIVE
Glucose, UA: NEGATIVE mg/dL
Ketones, ur: NEGATIVE mg/dL
Leukocytes,Ua: NEGATIVE
Nitrite: NEGATIVE
Protein, ur: 30 mg/dL — AB
Specific Gravity, Urine: 1.015 (ref 1.005–1.030)
pH: 5 (ref 5.0–8.0)

## 2019-09-09 LAB — GLUCOSE, CAPILLARY
Glucose-Capillary: 165 mg/dL — ABNORMAL HIGH (ref 70–99)
Glucose-Capillary: 175 mg/dL — ABNORMAL HIGH (ref 70–99)
Glucose-Capillary: 271 mg/dL — ABNORMAL HIGH (ref 70–99)
Glucose-Capillary: 212 mg/dL — ABNORMAL HIGH (ref 70–99)

## 2019-09-09 LAB — PROTIME-INR
INR: 2.9 — ABNORMAL HIGH (ref 0.8–1.2)
Prothrombin Time: 30.4 seconds — ABNORMAL HIGH (ref 11.4–15.2)

## 2019-09-09 MED ORDER — VANCOMYCIN HCL 10 G IV SOLR
2500.0000 mg | Freq: Once | INTRAVENOUS | Status: AC
  Administered 2019-09-09: 2500 mg via INTRAVENOUS
  Filled 2019-09-09: qty 2500

## 2019-09-09 MED ORDER — VANCOMYCIN HCL 1250 MG/250ML IV SOLN
1250.0000 mg | Freq: Two times a day (BID) | INTRAVENOUS | Status: DC
  Administered 2019-09-10: 1250 mg via INTRAVENOUS
  Filled 2019-09-09: qty 250

## 2019-09-09 MED ORDER — SODIUM CHLORIDE 0.9 % IV SOLN
2.0000 g | INTRAVENOUS | Status: DC
  Administered 2019-09-09 – 2019-10-07 (×28): 2 g via INTRAVENOUS
  Filled 2019-09-09 (×3): qty 2
  Filled 2019-09-09: qty 20
  Filled 2019-09-09: qty 2
  Filled 2019-09-09: qty 20
  Filled 2019-09-09 (×6): qty 2
  Filled 2019-09-09 (×2): qty 20
  Filled 2019-09-09 (×3): qty 2
  Filled 2019-09-09: qty 20
  Filled 2019-09-09: qty 2
  Filled 2019-09-09: qty 20
  Filled 2019-09-09: qty 2
  Filled 2019-09-09: qty 20
  Filled 2019-09-09 (×3): qty 2
  Filled 2019-09-09: qty 20
  Filled 2019-09-09 (×3): qty 2
  Filled 2019-09-09: qty 20
  Filled 2019-09-09 (×3): qty 2

## 2019-09-09 MED ORDER — WARFARIN SODIUM 5 MG PO TABS: 5.0000 mg | ORAL_TABLET | Freq: Once | ORAL | Status: DC

## 2019-09-09 NOTE — Progress Notes (Signed)
  Echocardiogram 2D Echocardiogram has been performed.  Michiel Cowboy 09/09/2019, 10:41 AM

## 2019-09-09 NOTE — Progress Notes (Signed)
ReDS Clip Diuretic Study Pt study # [id number]  Your patient is in the Blinded arm of the ReDS Clip Diuretic study.  Your patient has had a ReDS reading and the reading has been transmitted to the cloud.   Thank You    Antonietta Jewel, PharmD, [place] Clinical Pharmacist  Phone: [phone] 09/09/2019 9:03 AM  Please check AMION for all [facility] phone numbers After 10:00 PM, call [place] [phone]

## 2019-09-09 NOTE — Progress Notes (Signed)
PROGRESS NOTE  [name] [id number] DOB: 04-Jun-1965   PCP: Biagio Borg, MD  Patient is from: Home  DOA: 09/08/2019 LOS: 1  Brief Narrative / Interim history: 54 y.o. male with history of CAD s/p CABG in 2000, SVG-RCA PCI with BMS in Feb 2015, he had re-do PCI in June 2019 DES to SVG graft, ascending aortic dissection in 2000 s/p repair with AVR on warfarin.  Paroxysmal A. fib.  NIDDM-2, HTN and HLD presenting with 5 days of chills, nausea, vomiting, diarrhea and dyspnea on exertion and orthopnea mainly with lying on the left side.  In ED, hemodynamically stable and saturating well on room air.  Mild temp to 100.1 F. Cr 1.31 (baseline 1 to 1.1).  BUN 17.  Hgb 10.4 (baseline 11-12).  BNP 535. HS trop 34> 28.  CXR without acute finding.  INR 2.3.  COVID-19 negative.  Cardiology consulted by EDP.  Admitted for nausea, vomiting, diarrhea and DOE.  Echocardiogram ordered by cardiology.  The next day, echo with EF of 55 to 60%, severe RAE, moderate LAE, 1.8 x 1.1 cm tricuspid valve mass on the atrial surface consistent with vegetation and moderate TVR.  Cultures obtained.  ID consulted and started on ceftriaxone and vancomycin for infective endocarditis.  Plan for TEE .   Subjective: Seen and examined earlier this morning.  Spiked fever to 100.5 earlier this morning.  Has not had further nausea, vomiting or diarrhea.  He says his GI symptoms are mostly in the morning when he brushes teeth. Reports chills.  Denies smoking cigarettes, drinking alcohol recreational drug use.  Objective: Vitals:   09/08/19 2059 09/09/19 0020 09/09/19 0451 09/09/19 1359  BP: (!) 144/82 (!) 102/53 120/74 125/76  Pulse: 96 80 88 84  Resp: (!) 22 20 18 16   Temp: (!) 101 F (38.3 C) 98.8 F (37.1 C) (!) 100.5 F (38.1 C) 98.7 F (37.1 C)  TempSrc: Oral   Oral  SpO2: 100% 97% 99% 98%  Weight: 128.2 kg  128 kg   Height: 6\' 2"  (1.88 m)       Intake/Output Summary (Last 24 hours) at 09/09/2019  1503 Last data filed at 09/09/2019 1300 Gross per 24 hour  Intake 600 ml  Output 851 ml  Net -251 ml   Filed Weights   09/08/19 0754 09/08/19 2059 09/09/19 0451  Weight: 129.3 kg 128.2 kg 128 kg    Examination:  GENERAL: No apparent distress. Nontoxic.  HEENT: MMM.  Vision and hearing grossly intact.  NECK: Supple.  No apparent JVD.  RESP:  No IWOB. Good air movement bilaterally. CVS:  RRR. Heart sounds normal.  ABD/GI/GU: BS present. Soft. Non tender.  MSK/EXT:  Moves extremities. No apparent deformity. No edema.  SKIN: no apparent skin lesion or wound NEURO: Awake, alert and oriented appropriately.  No apparent focal neuro deficit. PSYCH: Calm. Normal affect.  Procedures:  None  Microbiology summarized: COVID-19 PCR negative. GIP pending. Blood cultures pending.  Assessment & Plan: Infective endocarditis: Patient with fever and chills.  Echocardiogram with tricuspid valve vegetation and regurgitation. -ID consulted-started vancomycin and ceftriaxone -Follow blood cultures -Plan for TEE per cardiology  Nausea/vomiting/diarrhea: could be due to bacteremia.  Viral gastroenteritis is another possibility.  Abdominal exam benign.  Doubt C. difficile. -Continue symptomatic care -Follow GIP  Dyspnea on exertion/orthopnea:echo with EF of 55 to 60%, severe RAE, moderate LAE, 1.8 x 1.1 cm tricuspid valve mass on the atrial surface consistent with vegetation and moderate TVR.  No signs of  fluid overload.  BNP slightly elevated but no vascular congestion on chest x-ray.  Has history of asthma but not wheezing or coughing.  INR therapeutic to think of PE. -Cardiology following.  CAD s/p CABG in 2000, SVG-RCA PCI with BMS in Feb 2015, he had re-do PCI in June 2019 DES to SVG graft: Echocardiogram as above. -Continue home medications-metoprolol, Plavix, statin  Elevated troponin: Likely demand ischemia in the setting of the above.  No exertional chest pain. -Cardiac meds as  above  Ascending aortic dissection in 2000 s/p repair with AVR on warfarin.  INR therapeutic. -Plan to start IV heparin once INR less than 2.5 -Continue home p.o. Lasix 40 mg twice daily -Monitor fluid status and renal function.  Paroxysmal A. fib.  Rate controlled on metoprolol. -Continue home metoprolol -Anticoagulation as above.  Uncontrolled NIDDM-2 with hyperglycemia: A1c 8.2%. Recent Labs    09/08/19 2111 09/09/19 0859 09/09/19 1105  GLUCAP 151* 165* 175*  -Continue current regimen insulin, statin and gabapentin  Essential HTN: Normotensive. -Continue home metoprolol, amlodipine and losartan  HLD  -Check lipid panel -Continue atorvastatin.  Hypothyroidism: -Continue home Synthroid.  At risk for sleep apnea: STOP BANG score 6-7 which puts him at high risk for sleep apnea. -Needs outpatient sleep study.   Morbid obesity: Body mass index is 36.23 kg/m.  With comorbidities as above. -Encourage lifestyle change to lose weight.                DVT prophylaxis: Therapeutic INR on warfarin Code Status: Full code Family Communication: Patient and/or RN. Available if any question.  Discharge barrier: Evaluation and treatment of infective endocarditis on IV antibiotics Patient is from: Home Final disposition: Likely home after evaluation and treatment of infective endocarditis and clearance by consultants  Consultants:  Infectious disease Cardiology   Sch Meds:  Scheduled Meds:  atorvastatin  40 mg Oral Q breakfast   clopidogrel  75 mg Oral Daily   furosemide  40 mg Oral BID   gabapentin  300 mg Oral Q breakfast   insulin aspart  0-15 Units Subcutaneous TID WC   insulin aspart  0-5 Units Subcutaneous QHS   levothyroxine  50 mcg Oral Q0600   losartan  100 mg Oral Q breakfast   metoprolol  200 mg Oral Q breakfast   sodium chloride flush  3 mL Intravenous Once   Continuous Infusions:  cefTRIAXone (ROCEPHIN)  IV     vancomycin     [START  ON 09/10/2019] vancomycin     PRN Meds:.acetaminophen **OR** acetaminophen, nitroGLYCERIN, ondansetron **OR** ondansetron (ZOFRAN) IV  Antimicrobials: Anti-infectives (From admission, onward)   Start     Dose/Rate Route Frequency Ordered Stop   09/10/19 0230  vancomycin (VANCOREADY) IVPB 1250 mg/250 mL     1,250 mg 166.7 mL/hr over 90 Minutes Intravenous Every 12 hours 09/09/19 1345     09/09/19 1430  cefTRIAXone (ROCEPHIN) 2 g in sodium chloride 0.9 % 100 mL IVPB     2 g 200 mL/hr over 30 Minutes Intravenous Every 24 hours 09/09/19 1335     09/09/19 1430  vancomycin (VANCOCIN) 2,500 mg in sodium chloride 0.9 % 500 mL IVPB     2,500 mg 250 mL/hr over 120 Minutes Intravenous  Once 09/09/19 1345         I have personally reviewed the following labs and images: CBC: Recent Labs  Lab 09/08/19 0810 09/09/19 0406  WBC 10.3 7.7  HGB 10.4* 10.2*  HCT 30.5* 28.7*  MCV 83.1 81.3  PLT 206 194   BMP &GFR Recent Labs  Lab 09/08/19 0810 09/08/19 1716 09/09/19 0406  NA 136  --  138  K 3.7  --  3.9  CL 104  --  104  CO2 21*  --  24  GLUCOSE 153*  --  187*  BUN 17  --  13  CREATININE 1.31*  --  1.11  CALCIUM 8.7*  --  8.9  MG  --  1.6*  --    Estimated Creatinine Clearance: 108.1 mL/min (by C-G formula based on SCr of 1.11 mg/dL). Liver & Pancreas: Recent Labs  Lab 09/09/19 0406  AST 23  ALT 15  ALKPHOS 80  BILITOT 1.1  PROT 7.2  ALBUMIN 2.5*   No results for input(s): LIPASE, AMYLASE in the last 168 hours. No results for input(s): AMMONIA in the last 168 hours. Diabetic: Recent Labs    09/08/19 0810  HGBA1C 8.2*   Recent Labs  Lab 09/08/19 2111 09/09/19 0859 09/09/19 1105  GLUCAP 151* 165* 175*   Cardiac Enzymes: No results for input(s): CKTOTAL, CKMB, CKMBINDEX, TROPONINI in the last 168 hours. No results for input(s): PROBNP in the last 8760 hours. Coagulation Profile: Recent Labs  Lab 09/06/19 0917 09/08/19 1100 09/09/19 0406  INR 2.3 2.3* 2.9*    Thyroid Function Tests: Recent Labs    09/08/19 1716  TSH 2.673   Lipid Profile: No results for input(s): CHOL, HDL, LDLCALC, TRIG, CHOLHDL, LDLDIRECT in the last 72 hours. Anemia Panel: Recent Labs    09/08/19 1716  FERRITIN 59  TIBC 203*  IRON 26*   Urine analysis:    Component Value Date/Time   COLORURINE YELLOW 09/09/2019 0946   APPEARANCEUR CLEAR 09/09/2019 0946   LABSPEC 1.015 09/09/2019 0946   PHURINE 5.0 09/09/2019 0946   GLUCOSEU NEGATIVE 09/09/2019 0946   GLUCOSEU 100 (A) 08/31/2017 0906   HGBUR SMALL (A) 09/09/2019 0946   BILIRUBINUR NEGATIVE 09/09/2019 0946   KETONESUR NEGATIVE 09/09/2019 0946   PROTEINUR 30 (A) 09/09/2019 0946   UROBILINOGEN 1.0 08/31/2017 0906   NITRITE NEGATIVE 09/09/2019 0946   LEUKOCYTESUR NEGATIVE 09/09/2019 0946   Sepsis Labs: Invalid input(s): PROCALCITONIN, [place]  Microbiology: Recent Results (from the past 240 hour(s))  SARS CORONAVIRUS 2 (TAT 6-24 HRS) Nasopharyngeal Nasopharyngeal Swab     Status: None   Collection Time: 09/08/19  1:50 PM   Specimen: Nasopharyngeal Swab  Result Value Ref Range Status   SARS Coronavirus 2 NEGATIVE NEGATIVE Final    Comment: (NOTE) SARS-CoV-2 target nucleic acids are NOT DETECTED. The SARS-CoV-2 RNA is generally detectable in upper and lower respiratory specimens during the acute phase of infection. Negative results do not preclude SARS-CoV-2 infection, do not rule out co-infections with other pathogens, and should not be used as the sole basis for treatment or other patient management decisions. Negative results must be combined with clinical observations, patient history, and epidemiological information. The expected result is Negative. Fact Sheet for Patients: SugarRoll.be Fact Sheet for Healthcare Providers: https://www.woods-mathews.com/ This test is not yet approved or cleared by the [place] FDA and  has been authorized for  detection and/or diagnosis of SARS-CoV-2 by FDA under an Emergency Use Authorization (EUA). This EUA will remain  in effect (meaning this test can be used) for the duration of the COVID-19 declaration under Section 56 4(b)(1) of the Act, 21 U.S.C. section 360bbb-3(b)(1), unless the authorization is terminated or revoked sooner. Performed at [facility], [place] [address], [place], [place] [address]  Radiology Studies: ECHOCARDIOGRAM COMPLETE  Result Date: 09/09/2019    ECHOCARDIOGRAM REPORT   Patient Name:   [name] Date of Exam: 09/09/2019 Medical Rec #:  [id number]        Height:       74.0 in Accession #:    [id number]       Weight:       282.2 lb Date of Birth:  1965/10/09        BSA:          2.516 m Patient Age:    20 years         BP:           120/74 mmHg Patient Gender: M                HR:           77 bpm. Exam Location:  Inpatient Procedure: 2D Echo, Cardiac Doppler and Color Doppler Indications:    Dyspnea 786.09  History:        Patient has prior history of Echocardiogram examinations, most                 recent 10/24/2017. CAD, Prior CABG, Signs/Symptoms:Shortness of                 Breath and Chest Pain; Risk Factors:Hypertension, Diabetes,                 Dyslipidemia and Non-Smoker. [place].                 Aortic Valve: mechanical valve is present in the aortic                 position. Procedure Date: 2000.  Sonographer:    Vickie Epley RDCS Referring Phys: [id number] [place]  1. Tricuspid valve vegetation. Discussed with referring provider.  2. Left ventricular ejection fraction, by estimation, is 55 to 60%. The left ventricle has normal function. The left ventricle has no regional wall motion abnormalities. Left ventricular diastolic parameters were normal.  3. Right ventricular systolic function is normal. The right ventricular size is mildly enlarged. There is normal pulmonary artery systolic pressure.  4. Left atrial size was moderately dilated.  5.  Right atrial size was severely dilated.  6. The mitral valve is normal in structure. Mild mitral valve regurgitation. No evidence of mitral stenosis.  7. There is a 1.8 x 1.1 cm tricuspid valve mass on the atrial surface consistent with vegetation. . Tricuspid valve regurgitation is moderate.  8. Transaortic velocity is higher than expected for mechanical aortic valve. The aortic valve is normal in structure. Aortic valve regurgitation is not visualized. No aortic stenosis is present. There is a mechanical valve present in the aortic position. Procedure Date: 2000. Aortic valve mean gradient measures 33.5 mmHg. Aortic valve Vmax measures 3.68 m/s.  9. Aortic root/ascending aorta has been repaired/replaced and [place]. 10. The inferior vena cava is normal in size with greater than 50% respiratory variability, suggesting right atrial pressure of 3 mmHg. FINDINGS  Left Ventricle: Left ventricular ejection fraction, by estimation, is 55 to 60%. The left ventricle has normal function. The left ventricle has no regional wall motion abnormalities. The left ventricular internal cavity size was normal in size. There is  no left ventricular hypertrophy. Left ventricular diastolic parameters were normal. Right Ventricle: The right ventricular size is mildly enlarged. No increase in right ventricular wall thickness. Right ventricular systolic function is normal. There is normal  pulmonary artery systolic pressure. The tricuspid regurgitant velocity is 2.51  m/s, and with an assumed right atrial pressure of 3 mmHg, the estimated right ventricular systolic pressure is [id number] mmHg. Left Atrium: Left atrial size was moderately dilated. Right Atrium: Right atrial size was severely dilated. Pericardium: There is no evidence of pericardial effusion. Mitral Valve: The mitral valve is normal in structure. Normal mobility of the mitral valve leaflets. Mild mitral annular calcification. Mild mitral valve regurgitation. No evidence of mitral  valve stenosis. Tricuspid Valve: There is a 1.8 x 1.1 cm tricuspid valve mass on the atrial surface consistent with vegetation. The tricuspid valve is normal in structure. Tricuspid valve regurgitation is moderate . No evidence of tricuspid stenosis. Aortic Valve: Transaortic velocity is higher than expected for mechanical aortic valve. The aortic valve is normal in structure. Aortic valve regurgitation is not visualized. No aortic stenosis is present. Aortic valve mean gradient measures 33.5 mmHg. Aortic valve peak gradient measures 54.3 mmHg. Aortic valve area, by VTI measures 0.93 cm. There is a mechanical valve present in the aortic position. Procedure Date: 2000. Pulmonic Valve: The pulmonic valve was normal in structure. Pulmonic valve regurgitation is mild. No evidence of pulmonic stenosis. Aorta: The aortic root/ascending aorta has been repaired/replaced and [place]. Venous: The inferior vena cava is normal in size with greater than 50% respiratory variability, suggesting right atrial pressure of 3 mmHg. IAS/Shunts: No atrial level shunt detected by color flow Doppler.  LEFT VENTRICLE PLAX 2D LVIDd:         5.60 cm      Diastology LVIDs:         4.60 cm      LV e' lateral:   13.10 cm/s LV PW:         1.10 cm      LV E/e' lateral: 5.7 LV IVS:        1.10 cm      LV e' medial:    8.49 cm/s LVOT diam:     2.20 cm      LV E/e' medial:  8.8 LV SV:         73 LV SV Index:   29 LVOT Area:     3.80 cm  LV Volumes (MOD) LV vol d, MOD A2C: 163.0 ml LV vol d, MOD A4C: 157.0 ml LV vol s, MOD A2C: 88.8 ml LV vol s, MOD A4C: 72.8 ml LV SV MOD A2C:     74.2 ml LV SV MOD A4C:     157.0 ml LV SV MOD BP:      77.9 ml RIGHT VENTRICLE RV S prime:     9.25 cm/s TAPSE (M-mode): 1.8 cm LEFT ATRIUM             Index       RIGHT ATRIUM           Index LA diam:        5.80 cm 2.31 cm/m  RA Area:     37.80 cm LA Vol (A2C):   65.8 ml 26.15 ml/m RA Volume:   141.00 ml 56.04 ml/m LA Vol (A4C):   76.5 ml 30.41 ml/m LA Biplane Vol:  77.8 ml 30.92 ml/m  AORTIC VALVE AV Area (Vmax):    0.95 cm AV Area (Vmean):   0.88 cm AV Area (VTI):     0.93 cm AV Vmax:           368.50 cm/s AV Vmean:  273.000 cm/s AV VTI:            0.786 m AV Peak Grad:      54.3 mmHg AV Mean Grad:      33.5 mmHg LVOT Vmax:         92.00 cm/s LVOT Vmean:        63.300 cm/s LVOT VTI:          0.193 m LVOT/AV VTI ratio: 0.25  AORTA Ao Root diam: 2.90 cm MITRAL VALVE               TRICUSPID VALVE MV Area (PHT): 5.02 cm    TR Peak grad:   25.2 mmHg MV Decel Time: 151 msec    TR Vmax:        251.00 cm/s MR Peak grad: 107.3 mmHg MR Vmax:      518.00 cm/s  SHUNTS MV E velocity: 74.60 cm/s  Systemic VTI:  0.19 m MV A velocity: 37.70 cm/s  Systemic Diam: 2.20 cm MV E/A ratio:  1.98 Candee Furbish MD Electronically signed by Candee Furbish MD Signature Date/Time: 09/09/2019/12:06:06 PM    Final      Zareena Willis T. [place]  If 7PM-7AM, please contact night-coverage [email or web address] Password [facility] 09/09/2019, 3:03 PM

## 2019-09-09 NOTE — Consult Note (Signed)
[facility] for Infectious Disease       Reason for Consult: TV vegetation    Referring Physician: Dr. Gardiner Rhyme  Principal Problem:   Shortness of breath Active Problems:   Diabetes mellitus type II, non insulin dependent (HCC)   Hyperlipidemia   Essential hypertension   PAF (paroxysmal atrial fibrillation) (HCC)   Hypothyroidism   Normocytic anemia   Obesity (BMI 30-39.9)   CAD (coronary artery disease)   Nausea vomiting and diarrhea   . atorvastatin  40 mg Oral Q breakfast  . clopidogrel  75 mg Oral Daily  . furosemide  40 mg Oral BID  . gabapentin  300 mg Oral Q breakfast  . insulin aspart  0-15 Units Subcutaneous TID WC  . insulin aspart  0-5 Units Subcutaneous QHS  . levothyroxine  50 mcg Oral Q0600  . losartan  100 mg Oral Q breakfast  . metoprolol  200 mg Oral Q breakfast  . sodium chloride flush  3 mL Intravenous Once    Recommendations: Will start vancomycin and ceftriaxone Blood cultures sent TEE per cardiology  Assessment: He has recent n/v and diarrhea but initially with chills and headace and currently with significant chills concerning for bacteremia from IE with the TTE findings.    Antibiotics: none  HPI: [name] is a 54 y.o. male with history of CAD, mechanical AV replacement in 2017 who came in with above history.  TTE done and concern for vegetation on native TV.  His main complaint is feeling cold in the room.  Fever to 101, WBC wnl.  Wife at bedside.  He has had some SOB and more difficulty with lying down on his left side.  No other complaints.  No rash.    Review of Systems:  Constitutional: positive for fevers and chills Respiratory: negative for cough Cardiovascular: positive for dyspnea Musculoskeletal: negative for edema All other systems reviewed and are negative    Past Medical History:  Diagnosis Date  . Ascending aortic dissection ([place])    a. 2000 - with aortic valve involvement. S/p repair of aortic dissection  with placement of mechanical AVR.  [name] CAD (coronary artery disease)    a. 2000 VG->PDA @ time of Ao dissection repair;  b. Cardiac CT 06/2010: no CAD, only mild plaque in prox RCA;  c. 2015 Cath: LM nl, LAD nl, LCX nl, RCA 100, VG->PDA 90 (4.5x12 Rebel BMS). d. 10/23/17 instent restenosis SVG to RCA, DES placed.  . Cholelithiasis 08/22/2013  . Chronic diastolic CHF (congestive heart failure) ([place])    a. 03/2016 Echo: EF 55-60%, no rwma, Gr2 DD.  . Diabetes mellitus   . Dyspnea   . Gross hematuria   . H/O mechanical aortic valve replacement    a. 03/2016 Echo: AoV mean gradient 24mmHg, Valve Area (VTI) 1.36 cm^2, (Vmax) 1.22 cm^2, mod dil LA.  [name] Hyperlipidemia   . Hypertension   . NEPHROLITHIASIS, HX OF   . Obesity   . PAF (paroxysmal atrial fibrillation) ([place])    a. H/o such, with recurrence of coarse afib/flutter during ER consult 03/2012.  . Stroke ([place])   . TRANSIENT ISCHEMIC ATTACK, HX OF    a. In 2004.  [name] Unspecified vitamin D deficiency     Social History   Tobacco Use  . Smoking status: Never Smoker  . Smokeless tobacco: Never Used  Substance Use Topics  . Alcohol use: No    Alcohol/week: 0.0 standard drinks  . Drug use: No  Family History  Problem Relation Age of Onset  . Coronary artery disease Mother   . Hypertension Mother   . Diabetes Brother   . Coronary artery disease Other 73       male 1st degree relative, CABG in his 75's (father)  . Cancer Father        ? lung    Allergies  Allergen Reactions  . Diltiazem Hives  . Insulin Glargine Swelling    edema    Physical Exam: Constitutional: in no apparent distress  Vitals:   09/09/19 0020 09/09/19 0451  BP: (!) 102/53 120/74  Pulse: 80 88  Resp: 20 18  Temp: 98.8 F (37.1 C) (!) 100.5 F (38.1 C)  SpO2: 97% 99%   EYES: anicteric Cardiovascular: Cor RRR, audible click Respiratory: clear; GI: Bowel sounds are normal, liver is not enlarged, spleen is not enlarged Musculoskeletal: no pedal edema  noted Skin: negatives: no rash Neuro: non-focal  Lab Results  Component Value Date   WBC 7.7 09/09/2019   HGB 10.2 (L) 09/09/2019   HCT 28.7 (L) 09/09/2019   MCV 81.3 09/09/2019   PLT 194 09/09/2019    Lab Results  Component Value Date   CREATININE 1.11 09/09/2019   BUN 13 09/09/2019   NA 138 09/09/2019   K 3.9 09/09/2019   CL 104 09/09/2019   CO2 24 09/09/2019    Lab Results  Component Value Date   ALT 15 09/09/2019   AST 23 09/09/2019   ALKPHOS 80 09/09/2019     Microbiology: Recent Results (from the past 240 hour(s))  SARS CORONAVIRUS 2 (TAT 6-24 HRS) Nasopharyngeal Nasopharyngeal Swab     Status: None   Collection Time: 09/08/19  1:50 PM   Specimen: Nasopharyngeal Swab  Result Value Ref Range Status   SARS Coronavirus 2 NEGATIVE NEGATIVE Final    Comment: (NOTE) SARS-CoV-2 target nucleic acids are NOT DETECTED. The SARS-CoV-2 RNA is generally detectable in upper and lower respiratory specimens during the acute phase of infection. Negative results do not preclude SARS-CoV-2 infection, do not rule out co-infections with other pathogens, and should not be used as the sole basis for treatment or other patient management decisions. Negative results must be combined with clinical observations, patient history, and epidemiological information. The expected result is Negative. Fact Sheet for Patients: SugarRoll.be Fact Sheet for Healthcare Providers: https://www.woods-mathews.com/ This test is not yet approved or cleared by the [place] FDA and  has been authorized for detection and/or diagnosis of SARS-CoV-2 by FDA under an Emergency Use Authorization (EUA). This EUA will remain  in effect (meaning this test can be used) for the duration of the COVID-19 declaration under Section 56 4(b)(1) of the Act, 21 U.S.C. section 360bbb-3(b)(1), unless the authorization is terminated or revoked sooner. Performed at [facility], [place] [address], [place], [place] [address]     Linell Shawn W Carlene Bickley, [place] for Infectious Disease [facility] [email or web address] 09/09/2019, 1:38 PM

## 2019-09-09 NOTE — Progress Notes (Addendum)
Progress Note  Patient Name: [name] Date of Encounter: 09/09/2019  Primary Cardiologist:  Kirk Ruths, MD  Subjective   Still very SOB, describes orthopnea and PND. Sx started 4 days ago. N&V, diarrhea have improved.  Inpatient Medications    Scheduled Meds: . atorvastatin  40 mg Oral Q breakfast  . clopidogrel  75 mg Oral Daily  . furosemide  40 mg Oral BID  . gabapentin  300 mg Oral Q breakfast  . insulin aspart  0-15 Units Subcutaneous TID WC  . insulin aspart  0-5 Units Subcutaneous QHS  . levothyroxine  50 mcg Oral Q0600  . losartan  100 mg Oral Q breakfast  . metoprolol  200 mg Oral Q breakfast  . sodium chloride flush  3 mL Intravenous Once  . Warfarin - Pharmacist Dosing Inpatient   Does not apply q1600   Continuous Infusions:  PRN Meds: acetaminophen **OR** acetaminophen, nitroGLYCERIN, ondansetron **OR** ondansetron (ZOFRAN) IV   Vital Signs    Vitals:   09/08/19 2041 09/08/19 2059 09/09/19 0020 09/09/19 0451  BP: 132/67 (!) 144/82 (!) 102/53 120/74  Pulse: 97 96 80 88  Resp:  (!) 22 20 18   Temp: 99.4 F (37.4 C) (!) 101 F (38.3 C) 98.8 F (37.1 C) (!) 100.5 F (38.1 C)  TempSrc: Oral Oral    SpO2: 97% 100% 97% 99%  Weight:  128.2 kg  128 kg  Height:  6\' 2"  (1.88 m)      Intake/Output Summary (Last 24 hours) at 09/09/2019 0824 Last data filed at 09/09/2019 0700 Gross per 24 hour  Intake --  Output 350 ml  Net -350 ml   Filed Weights   09/08/19 0754 09/08/19 2059 09/09/19 0451  Weight: 129.3 kg 128.2 kg 128 kg   Last Weight  Most recent update: 09/09/2019  5:01 AM   Weight  128 kg (282 lb 3 oz)           Weight change:    Telemetry    SR, no sig ectopy - Personally Reviewed  ECG    None today - Personally Reviewed  Physical Exam   General: Well developed, well nourished, male appearing in no acute distress. Head: Normocephalic, atraumatic.  Neck: Supple without bruits, JVD not seen elevated. Lungs:  Resp regular  and unlabored, rales bases Heart: RRR, S1, S2, no S3, S4, 2-3/6 murmur; +valve click, no rub. Abdomen: Soft, non-tender, non-distended with normoactive bowel sounds. No hepatomegaly. No rebound/guarding. No obvious abdominal masses. Extremities: No clubbing, cyanosis, no edema. Distal pedal pulses are 2+ bilaterally. Neuro: Alert and oriented X 3. Moves all extremities spontaneously. Psych: Normal affect.  Labs    Hematology Recent Labs  Lab 09/08/19 0810 09/09/19 0406  WBC 10.3 7.7  RBC 3.67* 3.53*  HGB 10.4* 10.2*  HCT 30.5* 28.7*  MCV 83.1 81.3  MCH 28.3 28.9  MCHC 34.1 35.5  RDW 14.5 14.4  PLT 206 194    Chemistry Recent Labs  Lab 09/08/19 0810 09/09/19 0406  NA 136 138  K 3.7 3.9  CL 104 104  CO2 21* 24  GLUCOSE 153* 187*  BUN 17 13  CREATININE 1.31* 1.11  CALCIUM 8.7* 8.9  PROT  --  7.2  ALBUMIN  --  2.5*  AST  --  23  ALT  --  15  ALKPHOS  --  80  BILITOT  --  1.1  GFRNONAA >60 >60  GFRAA >60 >60  ANIONGAP 11 10     High  Sensitivity Troponin:   Recent Labs  Lab 09/08/19 0810 09/08/19 1100  TROPONINIHS 34* 28*      BNP Recent Labs  Lab 09/08/19 1100  BNP 535.2*    Lab Results  Component Value Date   HGBA1C 8.2 (H) 09/08/2019   Lab Results  Component Value Date   INR 2.9 (H) 09/09/2019   INR 2.3 (H) 09/08/2019   INR 2.3 09/06/2019   PROTIME 15.9 11/06/2008   PROTIME 15.6 10/23/2008     Radiology    DG Chest 2 View  Result Date: 09/08/2019 CLINICAL DATA:  Dyspnea, nausea, vomiting EXAM: CHEST - 2 VIEW COMPARISON:  10/21/2017 chest radiograph. FINDINGS: Intact sternotomy wires. Aortic valve prosthesis is in place. Stable cardiomediastinal silhouette with mild cardiomegaly. No pneumothorax. No pleural effusion. Lungs appear clear, with no acute consolidative airspace disease and no pulmonary edema. IMPRESSION: Stable mild cardiomegaly without pulmonary edema. No active pulmonary disease. Electronically Signed   By: Ilona Sorrel M.D.    On: 09/08/2019 11:11     Cardiac Studies   ECHO:  ordered  Patient Profile     54 y.o. male w/ hx Deneen Harts & MDT tilting disc AVR in 2000 with CABG, SVG-RCA PCI with BMS in Feb 2015, he had re-do PCI in June 2019 DES to SVG graft, afib on coumadin, TIA in 2004, NIDDM, HTN, who was admitted 04/18 with dyspnea, N&V, diarrhea.  Assessment & Plan    1. Infective endocarditis: Patient has been febrile, tricuspid valve vegetation seen on echo today, with moderate TR -Check blood cultures -Consult infectious disease -TEE planned for tomorrow.  Elevated gradients across AV on TTE, will need to make sure prosthetic valve is not infected -Will hold warfarin in case procedures needed and start heparin gtt once INR <2.5  2. CAD s/p CABG w/ subsequent PCIs and Bental procedure w/ mech AVR - on coumadin, INR therapeutic.  Hold coumadin as above - no chest pain - minimal elevation in trop not c/w ACS  Otherwise, per IM Principal Problem:   Shortness of breath Active Problems:   Diabetes mellitus type II, non insulin dependent (HCC)   Hyperlipidemia   Essential hypertension   PAF (paroxysmal atrial fibrillation) (HCC)   Hypothyroidism   Normocytic anemia   Obesity (BMI 30-39.9)   CAD (coronary artery disease)   Nausea vomiting and diarrhea    Jonetta Speak , PA-C 8:24 AM 09/09/2019 Pager: [phone]  Patient seen and examined.  Agree with above documentation.  He remains short of breath.  On exam, patient is alert and oriented, regular rate and rhythm, 2/6 systolic murmur, mechanical click, lungs CTAB, no LE edema or JVD.  Telemetry personally reviewed and shows sinus rhythm with rates 70s to 100s.  TTE today shows tricuspid valve vegetation measuring 1.8 x 1.1 cm, with moderate TR.  His symptoms are likely due to infective endocarditis.  No clear prosthetic valve vegetation, but does have elevated gradients (mean gradient 34 mmHg).  Will need TEE for further evaluation,  will plan for tomorrow. Will consult ID and check blood cultures.  Will hold warfarin in case procedures needed and start heparin drip once INR less than 2.5.  After careful review of history and examination, the risks and benefits of transesophageal echocardiogram have been explained including risks of esophageal damage, perforation (1:10,000 risk), bleeding, pharyngeal hematoma as well as other potential complications associated with conscious sedation including aspiration, arrhythmia, respiratory failure and death. Alternatives to treatment were discussed, questions were answered. Patient is willing  to proceed.   Donato Heinz, MD

## 2019-09-09 NOTE — Progress Notes (Signed)
[place] for warfarin Indication: atrial fibrillation  Allergies  Allergen Reactions  . Diltiazem Hives  . Insulin Glargine Swelling    edema    Patient Measurements: Height: 6\' 2"  (188 cm) Weight: 128 kg (282 lb 3 oz) IBW/kg (Calculated) : 82.2  Vital Signs: Temp: 100.5 F (38.1 C) (04/19 0451) BP: 120/74 (04/19 0451) Pulse Rate: 88 (04/19 0451)  Labs: Recent Labs    09/08/19 0810 09/08/19 1100 09/09/19 0406  HGB 10.4*  --  10.2*  HCT 30.5*  --  28.7*  PLT 206  --  194  LABPROT  --  25.5* 30.4*  INR  --  2.3* 2.9*  CREATININE 1.31*  --  1.11  TROPONINIHS 34* 28*  --     Estimated Creatinine Clearance: 108.1 mL/min (by C-G formula based on SCr of 1.11 mg/dL).   Medical History: Past Medical History:  Diagnosis Date  . Ascending aortic dissection ([place])    a. 2000 - with aortic valve involvement. S/p repair of aortic dissection with placement of mechanical AVR.  [name] CAD (coronary artery disease)    a. 2000 VG->PDA @ time of Ao dissection repair;  b. Cardiac CT 06/2010: no CAD, only mild plaque in prox RCA;  c. 2015 Cath: LM nl, LAD nl, LCX nl, RCA 100, VG->PDA 90 (4.5x12 Rebel BMS). d. 10/23/17 instent restenosis SVG to RCA, DES placed.  . Cholelithiasis 08/22/2013  . Chronic diastolic CHF (congestive heart failure) ([place])    a. 03/2016 Echo: EF 55-60%, no rwma, Gr2 DD.  . Diabetes mellitus   . Dyspnea   . Gross hematuria   . H/O mechanical aortic valve replacement    a. 03/2016 Echo: AoV mean gradient 52mmHg, Valve Area (VTI) 1.36 cm^2, (Vmax) 1.22 cm^2, mod dil LA.  [name] Hyperlipidemia   . Hypertension   . NEPHROLITHIASIS, HX OF   . Obesity   . PAF (paroxysmal atrial fibrillation) ([place])    a. H/o such, with recurrence of coarse afib/flutter during ER consult 03/2012.  . Stroke ([place])   . TRANSIENT ISCHEMIC ATTACK, HX OF    a. In 2004.  [name] Unspecified vitamin D deficiency    Assessment: 77 YOM presenting with SOB and N/V, on  warfarin PTA for Afib. INR therapeutic at 2.9, CBC stable.  PTA dosing: 5mg  MWF, 7.5mg  all other days  Goal of Therapy:  INR 2-3 Monitor platelets by anticoagulation protocol: Yes   Plan:  -Warfarin 5mg  PO x1 tonight -Daily protime   Arrie Senate, PharmD, BCPS Clinical Pharmacist [phone] Please check AMION for all [place] numbers 09/09/2019

## 2019-09-09 NOTE — Progress Notes (Signed)
[facility] for warfarin Indication: atrial fibrillation + mechanical AVR  Allergies  Allergen Reactions  . Diltiazem Hives  . Insulin Glargine Swelling    edema    Patient Measurements: Height: 6\' 2"  (188 cm) Weight: 128 kg (282 lb 3 oz) IBW/kg (Calculated) : 82.2  Vital Signs: Temp: 100.5 F (38.1 C) (04/19 0451) BP: 120/74 (04/19 0451) Pulse Rate: 88 (04/19 0451)  Labs: Recent Labs    09/08/19 0810 09/08/19 1100 09/09/19 0406  HGB 10.4*  --  10.2*  HCT 30.5*  --  28.7*  PLT 206  --  194  LABPROT  --  25.5* 30.4*  INR  --  2.3* 2.9*  CREATININE 1.31*  --  1.11  TROPONINIHS 34* 28*  --     Estimated Creatinine Clearance: 108.1 mL/min (by C-G formula based on SCr of 1.11 mg/dL).   Medical History: Past Medical History:  Diagnosis Date  . Ascending aortic dissection ([place])    a. 2000 - with aortic valve involvement. S/p repair of aortic dissection with placement of mechanical AVR.  [name] CAD (coronary artery disease)    a. 2000 VG->PDA @ time of Ao dissection repair;  b. Cardiac CT 06/2010: no CAD, only mild plaque in prox RCA;  c. 2015 Cath: LM nl, LAD nl, LCX nl, RCA 100, VG->PDA 90 (4.5x12 Rebel BMS). d. 10/23/17 instent restenosis SVG to RCA, DES placed.  . Cholelithiasis 08/22/2013  . Chronic diastolic CHF (congestive heart failure) ([place])    a. 03/2016 Echo: EF 55-60%, no rwma, Gr2 DD.  . Diabetes mellitus   . Dyspnea   . Gross hematuria   . H/O mechanical aortic valve replacement    a. 03/2016 Echo: AoV mean gradient 47mmHg, Valve Area (VTI) 1.36 cm^2, (Vmax) 1.22 cm^2, mod dil LA.  [name] Hyperlipidemia   . Hypertension   . NEPHROLITHIASIS, HX OF   . Obesity   . PAF (paroxysmal atrial fibrillation) ([place])    a. H/o such, with recurrence of coarse afib/flutter during ER consult 03/2012.  . Stroke ([place])   . TRANSIENT ISCHEMIC ATTACK, HX OF    a. In 2004.  [name] Unspecified vitamin D deficiency    Assessment: 52 YOM presenting  with SOB and N/V, on warfarin PTA for Afib and mechanical AVR. INR therapeutic at 2.9, CBC stable. Vegetation seen on TTE, will need to hold warfarin and begin IV heparin with impending need for procedures.  PTA dosing: 5mg  MWF, 7.5mg  all other days  Goal of Therapy:  INR 2.5-3.5 Monitor platelets by anticoagulation protocol: Yes   Plan:  -Hold warfarin -INR in the am - begin heparin once < 2.5   Arrie Senate, PharmD, BCPS Clinical Pharmacist [phone] Please check AMION for all [place] numbers 09/09/2019

## 2019-09-09 NOTE — Progress Notes (Addendum)
   Echocardiogram reviewed by Dr. Marlou Porch.  He reports a tricuspid vegetation, recommends getting CRP and sed rate as well as blood cultures>>done.  Reviewed all the information with Dr. Gardiner Rhyme.  He recommends ID consult (called) and TEE, will schedule (Trish aware). TEE will be Wednesday.  Coumadin on hold, heparin per pharmacy when appropriate.  Rosaria Ferries, PA-C 09/09/2019 12:42 PM

## 2019-09-09 NOTE — Progress Notes (Signed)
Pharmacy Antibiotic Note  [name] is a 54 y.o. male admitted on 09/08/2019 with GI upset and SOB, also reported fevers. Pt has hx mechanical AVR found to have TV vegetation on TTE 4/19.  Pharmacy has been consulted for vancomycin dosing for endocarditis.  Plan: Vancomycin 2500mg  IV x1 then 1250mg  q12h - est AUC 502 Check vancomycin levels at Css Follow Cr, cultures, ID recs   Height: 6\' 2"  (188 cm) Weight: 128 kg (282 lb 3 oz) IBW/kg (Calculated) : 82.2  Temp (24hrs), Avg:99.8 F (37.7 C), Min:98.8 F (37.1 C), Max:101 F (38.3 C)  Recent Labs  Lab 09/08/19 0810 09/09/19 0406  WBC 10.3 7.7  CREATININE 1.31* 1.11    Estimated Creatinine Clearance: 108.1 mL/min (by C-G formula based on SCr of 1.11 mg/dL).    Allergies  Allergen Reactions  . Diltiazem Hives  . Insulin Glargine Swelling    edema    Antimicrobials this admission: Vancomycin 4/19 >>  Microbiology results: sent  Thank you for allowing pharmacy to be a part of this patient's care.   Arrie Senate, PharmD, BCPS Clinical Pharmacist [phone] Please check AMION for all [place] numbers 09/09/2019

## 2019-09-10 DIAGNOSIS — M549 Dorsalgia, unspecified: Secondary | ICD-10-CM

## 2019-09-10 DIAGNOSIS — N179 Acute kidney failure, unspecified: Secondary | ICD-10-CM

## 2019-09-10 DIAGNOSIS — I079 Rheumatic tricuspid valve disease, unspecified: Secondary | ICD-10-CM | POA: Diagnosis present

## 2019-09-10 DIAGNOSIS — R7881 Bacteremia: Secondary | ICD-10-CM | POA: Diagnosis not present

## 2019-09-10 DIAGNOSIS — I330 Acute and subacute infective endocarditis: Secondary | ICD-10-CM | POA: Diagnosis not present

## 2019-09-10 DIAGNOSIS — R0989 Other specified symptoms and signs involving the circulatory and respiratory systems: Secondary | ICD-10-CM

## 2019-09-10 DIAGNOSIS — I2510 Atherosclerotic heart disease of native coronary artery without angina pectoris: Secondary | ICD-10-CM | POA: Diagnosis not present

## 2019-09-10 DIAGNOSIS — B955 Unspecified streptococcus as the cause of diseases classified elsewhere: Secondary | ICD-10-CM | POA: Diagnosis present

## 2019-09-10 DIAGNOSIS — Z952 Presence of prosthetic heart valve: Secondary | ICD-10-CM | POA: Diagnosis not present

## 2019-09-10 DIAGNOSIS — R011 Cardiac murmur, unspecified: Secondary | ICD-10-CM | POA: Diagnosis not present

## 2019-09-10 LAB — BLOOD CULTURE ID PANEL (REFLEXED)

## 2019-09-10 LAB — GLUCOSE, CAPILLARY
Glucose-Capillary: 189 mg/dL — ABNORMAL HIGH (ref 70–99)
Glucose-Capillary: 278 mg/dL — ABNORMAL HIGH (ref 70–99)
Glucose-Capillary: 233 mg/dL — ABNORMAL HIGH (ref 70–99)
Glucose-Capillary: 253 mg/dL — ABNORMAL HIGH (ref 70–99)

## 2019-09-10 LAB — RENAL FUNCTION PANEL
Albumin: 2.6 g/dL — ABNORMAL LOW (ref 3.5–5.0)
Anion gap: 10 (ref 5–15)
BUN: 16 mg/dL (ref 6–20)
CO2: 23 mmol/L (ref 22–32)
Calcium: 8.6 mg/dL — ABNORMAL LOW (ref 8.9–10.3)
Chloride: 104 mmol/L (ref 98–111)
Creatinine, Ser: 1.5 mg/dL — ABNORMAL HIGH (ref 0.61–1.24)
GFR calc Af Amer: >60 mL/min (ref >60)
GFR calc non Af Amer: 52 mL/min — ABNORMAL LOW (ref >60)
Glucose, Bld: 181 mg/dL — ABNORMAL HIGH (ref 70–99)
Phosphorus: 3.2 mg/dL (ref 2.5–4.6)
Potassium: 3.5 mmol/L (ref 3.5–5.1)
Sodium: 137 mmol/L (ref 135–145)

## 2019-09-10 LAB — SEDIMENTATION RATE: Sed Rate: 124 mm/h — ABNORMAL HIGH (ref 0–16)

## 2019-09-10 LAB — GASTROINTESTINAL PANEL BY PCR, STOOL (REPLACES STOOL CULTURE)

## 2019-09-10 LAB — CBC
HCT: 29.7 % — ABNORMAL LOW (ref 39.0–52.0)
Hemoglobin: 10.5 g/dL — ABNORMAL LOW (ref 13.0–17.0)
MCH: 28.5 pg (ref 26.0–34.0)
MCHC: 35.4 g/dL (ref 30.0–36.0)
MCV: 80.7 fL (ref 80.0–100.0)
Platelets: 213 10*3/uL (ref 150–400)
RBC: 3.68 MIL/uL — ABNORMAL LOW (ref 4.22–5.81)
RDW: 14.2 % (ref 11.5–15.5)
WBC: 9.4 10*3/uL (ref 4.0–10.5)
nRBC: 0 % (ref 0.0–0.2)

## 2019-09-10 LAB — PROTIME-INR
INR: 3.1 — ABNORMAL HIGH (ref 0.8–1.2)
Prothrombin Time: 32 s — ABNORMAL HIGH (ref 11.4–15.2)

## 2019-09-10 LAB — MAGNESIUM: Magnesium: 1.5 mg/dL — ABNORMAL LOW (ref 1.7–2.4)

## 2019-09-10 LAB — HIGH SENSITIVITY CRP: CRP, High Sensitivity: 228.45 mg/L — ABNORMAL HIGH (ref 0.00–3.00)

## 2019-09-10 MED ORDER — INSULIN ASPART 100 UNIT/ML ~~LOC~~ SOLN
0.0000 [IU] | Freq: Every day | SUBCUTANEOUS | Status: DC
  Administered 2019-09-10: 3 [IU] via SUBCUTANEOUS
  Administered 2019-09-18: 23:00:00 2 [IU] via SUBCUTANEOUS

## 2019-09-10 MED ORDER — INSULIN DETEMIR 100 UNIT/ML ~~LOC~~ SOLN
6.0000 [IU] | Freq: Every day | SUBCUTANEOUS | Status: DC
  Filled 2019-09-10: qty 0.06

## 2019-09-10 MED ORDER — SODIUM CHLORIDE 0.9 % IV SOLN: INTRAVENOUS | Status: DC

## 2019-09-10 MED ORDER — VANCOMYCIN HCL 1750 MG/350ML IV SOLN
1750.0000 mg | INTRAVENOUS | Status: DC
  Administered 2019-09-10: 1750 mg via INTRAVENOUS
  Filled 2019-09-10: qty 350

## 2019-09-10 MED ORDER — INSULIN DETEMIR 100 UNIT/ML ~~LOC~~ SOLN
6.0000 [IU] | Freq: Two times a day (BID) | SUBCUTANEOUS | Status: DC
  Administered 2019-09-10 – 2019-09-25 (×30): 6 [IU] via SUBCUTANEOUS
  Filled 2019-09-10 (×33): qty 0.06

## 2019-09-10 MED ORDER — INSULIN ASPART 100 UNIT/ML ~~LOC~~ SOLN
0.0000 [IU] | Freq: Three times a day (TID) | SUBCUTANEOUS | Status: DC
  Administered 2019-09-11: 3 [IU] via SUBCUTANEOUS
  Administered 2019-09-11: 5 [IU] via SUBCUTANEOUS
  Administered 2019-09-12: 8 [IU] via SUBCUTANEOUS
  Administered 2019-09-12 – 2019-09-13 (×3): 3 [IU] via SUBCUTANEOUS
  Administered 2019-09-13 – 2019-09-14 (×3): 5 [IU] via SUBCUTANEOUS
  Administered 2019-09-14 – 2019-09-15 (×3): 3 [IU] via SUBCUTANEOUS
  Administered 2019-09-16 (×2): 2 [IU] via SUBCUTANEOUS
  Administered 2019-09-17: 3 [IU] via SUBCUTANEOUS
  Administered 2019-09-17: 5 [IU] via SUBCUTANEOUS
  Administered 2019-09-19: 3 [IU] via SUBCUTANEOUS
  Administered 2019-09-19 – 2019-09-20 (×2): 2 [IU] via SUBCUTANEOUS
  Administered 2019-09-20: 5 [IU] via SUBCUTANEOUS
  Administered 2019-09-22: 2 [IU] via SUBCUTANEOUS
  Administered 2019-09-22 (×2): 3 [IU] via SUBCUTANEOUS
  Administered 2019-09-23 (×3): 2 [IU] via SUBCUTANEOUS
  Administered 2019-09-24: 12:00:00 3 [IU] via SUBCUTANEOUS
  Administered 2019-09-24 – 2019-09-25 (×2): 2 [IU] via SUBCUTANEOUS

## 2019-09-10 MED ORDER — INSULIN ASPART 100 UNIT/ML ~~LOC~~ SOLN
6.0000 [IU] | Freq: Three times a day (TID) | SUBCUTANEOUS | Status: DC
  Administered 2019-09-13 – 2019-09-25 (×23): 6 [IU] via SUBCUTANEOUS

## 2019-09-10 MED ORDER — ASPIRIN EC 81 MG PO TBEC
81.0000 mg | DELAYED_RELEASE_TABLET | Freq: Every day | ORAL | Status: DC
  Administered 2019-09-10 – 2019-09-25 (×14): 81 mg via ORAL
  Filled 2019-09-10 (×14): qty 1

## 2019-09-10 NOTE — Progress Notes (Signed)
PROGRESS NOTE  [name] [id number] DOB: [birth year]   PCP: Biagio Borg, MD  Patient is from: Home  DOA: 09/08/2019 LOS: 2  Brief Narrative / Interim history: 54 y.o. male with history of CAD s/p CABG in 2000, SVG-RCA PCI with BMS in Feb 2015, he had re-do PCI in June 2019 DES to SVG graft, ascending aortic dissection in 2000 s/p repair with AVR on warfarin.  Paroxysmal A. fib.  NIDDM-2, HTN and HLD presenting with 5 days of chills, nausea, vomiting, diarrhea and dyspnea on exertion and orthopnea mainly with lying on the left side.  In ED, hemodynamically stable and saturating well on room air.  Mild temp to 100.1 F. Cr 1.31 (baseline 1 to 1.1).  BUN 17.  Hgb 10.4 (baseline 11-12).  BNP 535. HS trop 34> 28.  CXR without acute finding.  INR 2.3.  COVID-19 negative.  Cardiology consulted by EDP.  Admitted for nausea, vomiting, diarrhea and DOE.  Echocardiogram ordered by cardiology.  The next day, echo with EF of 55 to 60%, severe RAE, moderate LAE, 1.8 x 1.1 cm tricuspid valve mass on the atrial surface consistent with vegetation and moderate TVR.  Blood cultures with GPC in clusters in 1/2.  BCID with Streptococcus species.  ID consulted and started on ceftriaxone and vancomycin for infective endocarditis/bacteremia.  Repeat blood culture obtained on 4/20.  Plan for TEE on 4/21.   Subjective: Seen and examined earlier this morning.  Spiked fever to 102.7 about 9 PM last night.  Otherwise he feels better from breathing standpoint.  GI symptoms resolved.  Denies chest pain.  Objective: Vitals:   09/09/19 2039 09/10/19 0235 09/10/19 0610 09/10/19 1300  BP: (!) 96/43  108/62 117/60  Pulse: 84  83 90  Resp: 20  20 20   Temp: (!) 102.7 F (39.3 C) (!) 100.9 F (38.3 C) 99.7 F (37.6 C) 98 F (36.7 C)  TempSrc: Oral Oral Oral Oral  SpO2: 96%  94% 97%  Weight:   127.3 kg   Height:        Intake/Output Summary (Last 24 hours) at 09/10/2019 1759 Last data filed at 09/10/2019  0900 Gross per 24 hour  Intake 1523.89 ml  Output 450 ml  Net 1073.89 ml   Filed Weights   09/08/19 2059 09/09/19 0451 09/10/19 0610  Weight: 128.2 kg 128 kg 127.3 kg    Examination:  GENERAL: No apparent distress.  Nontoxic. HEENT: MMM.  Vision and hearing grossly intact.  NECK: Supple.  No apparent JVD.  RESP: On room air.  No IWOB.  Fair aeration bilaterally. CVS:  RRR.  2/6 SEM all over with mechanical heart sounds. ABD/GI/GU: Bowel sounds present. Soft. Non tender.  MSK/EXT:  Moves extremities. No apparent deformity. No edema.  SKIN: no apparent skin lesion or wound NEURO: Awake, alert and oriented appropriately.  No apparent focal neuro deficit. PSYCH: Calm. Normal affect.  Procedures:  None  Microbiology summarized: COVID-19 PCR negative. GIP pending. Blood cultures pending.  Assessment & Plan: Infective endocarditis/bacteremia: Patient with fever and chills.  Echocardiogram with tricuspid valve vegetation and regurgitation.  Blood culture 4/19 with GPC in clusters in 1/2.  BCID with Streptococcus species.  -ID consulted-started vancomycin and ceftriaxone -Follow blood cultures speciation and sensitivity -Repeat blood culture today -Plan for TEE on 4/21  Nausea/vomiting/diarrhea: Could be due to the above.  GIP negative.  GI symptoms resolved. -Continue symptomatic care -Discontinue enteric precautions  Dyspnea on exertion/orthopnea:echo with EF of 55 to 60%, severe RAE,  moderate LAE, 1.8 x 1.1 cm tricuspid valve mass on the atrial surface consistent with vegetation and moderate TVR.  No signs of fluid overload.  BNP slightly elevated but no vascular congestion on CXR.  Has history of asthma but not wheezing or coughing.  INR therapeutic to think of PE.  Overall, respiratory symptoms improved. -Hold home Lasix in the setting of AKI.  CAD s/p CABG in 2000, SVG-RCA PCI with BMS in Feb 2015, he had re-do PCI in June 2019 DES to SVG graft: Echocardiogram as  above. -Continue home ASA and statin.   -Plavix on hold in the setting of #1.  Last dose on 4/19 -Metoprolol, losartan and Lasix on hold due to AKI and soft blood pressures  Elevated troponin: Likely demand ischemia in the setting of the above.  No exertional chest pain. -Cardiac meds as above  Ascending aortic dissection in 2000 s/p repair with AVR on warfarin.  INR therapeutic. -Plan to start IV heparin once INR less than 2.5 -Monitor fluid status and renal function.  Paroxysmal A. fib.  Rate controlled on metoprolol. -Metoprolol on hold per cardiology. -Anticoagulation as above.  Uncontrolled NIDDM-2 with hyperglycemia: A1c 8.2%. Recent Labs    09/10/19 0804 09/10/19 1144 09/10/19 1552  GLUCAP 189* 278* 233*  -Continue SSI-moderate -Start Levemir 6 units twice daily and NovoLog 6 units AC -Continue statin.  Check lipid panel  Acute kidney injury: Likely due to hypotension and nephrotoxic meds (vancomycin, Lasix and losartan) -Holding metoprolol, Lasix and losartan -Continue monitoring  Essential HTN: Was hypotensive overnight normotensive with soft blood pressures. -metoprolol, amlodipine and losartan on hold.  HLD  -Check lipid panel -Continue atorvastatin.  Hypothyroidism: -Continue home Synthroid.  At risk for sleep apnea: STOP BANG score 6-7 which puts him at high risk for sleep apnea. -Needs outpatient sleep study.   Morbid obesity: Body mass index is 36.03 kg/m.  With comorbidities as above. -Encourage lifestyle change to lose weight.                DVT prophylaxis: Heparin drip when INR less than 2.5. Code Status: Full code Family Communication: Patient and/or RN. Available if any question.  Discharge barrier: Evaluation and treatment of infective endocarditis on IV antibiotics Patient is from: Home Final disposition: Likely home after evaluation and treatment of infective endocarditis and clearance by consultants  Consultants:  Infectious  disease Cardiology   Sch Meds:  Scheduled Meds: . aspirin EC  81 mg Oral Daily  . atorvastatin  40 mg Oral Q breakfast  . gabapentin  300 mg Oral Q breakfast  . [START ON 09/11/2019] insulin aspart  0-15 Units Subcutaneous TID WC  . insulin aspart  0-5 Units Subcutaneous QHS  . [START ON 09/11/2019] insulin aspart  6 Units Subcutaneous TID WC  . insulin detemir  6 Units Subcutaneous BID  . levothyroxine  50 mcg Oral Q0600  . sodium chloride flush  3 mL Intravenous Once   Continuous Infusions: . [START ON 09/11/2019] sodium chloride    . cefTRIAXone (ROCEPHIN)  IV 2 g (09/10/19 1421)  . vancomycin     PRN Meds:.acetaminophen **OR** acetaminophen, nitroGLYCERIN, ondansetron **OR** ondansetron (ZOFRAN) IV  Antimicrobials: Anti-infectives (From admission, onward)   Start     Dose/Rate Route Frequency Ordered Stop   09/10/19 2300  vancomycin (VANCOREADY) IVPB 1750 mg/350 mL     1,750 mg 175 mL/hr over 120 Minutes Intravenous Every 24 hours 09/10/19 0721     09/10/19 0600  vancomycin (VANCOREADY) IVPB 1250  mg/250 mL  Status:  Discontinued     1,250 mg 166.7 mL/hr over 90 Minutes Intravenous Every 12 hours 09/09/19 1345 09/10/19 0721   09/09/19 1430  cefTRIAXone (ROCEPHIN) 2 g in sodium chloride 0.9 % 100 mL IVPB     2 g 200 mL/hr over 30 Minutes Intravenous Every 24 hours 09/09/19 1335     09/09/19 1430  vancomycin (VANCOCIN) 2,500 mg in sodium chloride 0.9 % 500 mL IVPB     2,500 mg 250 mL/hr over 120 Minutes Intravenous  Once 09/09/19 1345 09/09/19 1847       I have personally reviewed the following labs and images: CBC: Recent Labs  Lab 09/08/19 0810 09/09/19 0406 09/10/19 0448  WBC 10.3 7.7 9.4  HGB 10.4* 10.2* 10.5*  HCT 30.5* 28.7* 29.7*  MCV 83.1 81.3 80.7  PLT 206 194 213   BMP &GFR Recent Labs  Lab 09/08/19 0810 09/08/19 1716 09/09/19 0406 09/10/19 0448  NA 136  --  138 137  K 3.7  --  3.9 3.5  CL 104  --  104 104  CO2 21*  --  24 23  GLUCOSE 153*  --   187* 181*  BUN 17  --  13 16  CREATININE 1.31*  --  1.11 1.50*  CALCIUM 8.7*  --  8.9 8.6*  MG  --  1.6*  --  1.5*  PHOS  --   --   --  3.2   Estimated Creatinine Clearance: 79.8 mL/min (A) (by C-G formula based on SCr of 1.5 mg/dL (H)). Liver & Pancreas: Recent Labs  Lab 09/09/19 0406 09/10/19 0448  AST 23  --   ALT 15  --   ALKPHOS 80  --   BILITOT 1.1  --   PROT 7.2  --   ALBUMIN 2.5* 2.6*   No results for input(s): LIPASE, AMYLASE in the last 168 hours. No results for input(s): AMMONIA in the last 168 hours. Diabetic: Recent Labs    09/08/19 0810  HGBA1C 8.2*   Recent Labs  Lab 09/09/19 1618 09/09/19 2056 09/10/19 0804 09/10/19 1144 09/10/19 1552  GLUCAP 271* 212* 189* 278* 233*   Cardiac Enzymes: No results for input(s): CKTOTAL, CKMB, CKMBINDEX, TROPONINI in the last 168 hours. No results for input(s): PROBNP in the last 8760 hours. Coagulation Profile: Recent Labs  Lab 09/06/19 0917 09/08/19 1100 09/09/19 0406 09/10/19 0448  INR 2.3 2.3* 2.9* 3.1*   Thyroid Function Tests: Recent Labs    09/08/19 1716  TSH 2.673   Lipid Profile: No results for input(s): CHOL, HDL, LDLCALC, TRIG, CHOLHDL, LDLDIRECT in the last 72 hours. Anemia Panel: Recent Labs    09/08/19 1716  FERRITIN 59  TIBC 203*  IRON 26*   Urine analysis:    Component Value Date/Time   COLORURINE YELLOW 09/09/2019 0946   APPEARANCEUR CLEAR 09/09/2019 0946   LABSPEC 1.015 09/09/2019 0946   PHURINE 5.0 09/09/2019 0946   GLUCOSEU NEGATIVE 09/09/2019 0946   GLUCOSEU 100 (A) 08/31/2017 0906   HGBUR SMALL (A) 09/09/2019 0946   BILIRUBINUR NEGATIVE 09/09/2019 0946   KETONESUR NEGATIVE 09/09/2019 0946   PROTEINUR 30 (A) 09/09/2019 0946   UROBILINOGEN 1.0 08/31/2017 0906   NITRITE NEGATIVE 09/09/2019 0946   LEUKOCYTESUR NEGATIVE 09/09/2019 0946   Sepsis Labs: Invalid input(s): PROCALCITONIN, [place]  Microbiology: Recent Results (from the past 240 hour(s))  SARS  CORONAVIRUS 2 (TAT 6-24 HRS) Nasopharyngeal Nasopharyngeal Swab     Status: None   Collection Time: 09/08/19  1:50 PM   Specimen: Nasopharyngeal Swab  Result Value Ref Range Status   SARS Coronavirus 2 NEGATIVE NEGATIVE Final    Comment: (NOTE) SARS-CoV-2 target nucleic acids are NOT DETECTED. The SARS-CoV-2 RNA is generally detectable in upper and lower respiratory specimens during the acute phase of infection. Negative results do not preclude SARS-CoV-2 infection, do not rule out co-infections with other pathogens, and should not be used as the sole basis for treatment or other patient management decisions. Negative results must be combined with clinical observations, patient history, and epidemiological information. The expected result is Negative. Fact Sheet for Patients: SugarRoll.be Fact Sheet for Healthcare Providers: https://www.woods-mathews.com/ This test is not yet approved or cleared by the [place] FDA and  has been authorized for detection and/or diagnosis of SARS-CoV-2 by FDA under an Emergency Use Authorization (EUA). This EUA will remain  in effect (meaning this test can be used) for the duration of the COVID-19 declaration under Section 56 4(b)(1) of the Act, 21 U.S.C. section 360bbb-3(b)(1), unless the authorization is terminated or revoked sooner. Performed at [facility], [place] [address] [place], [place] [address]   Gastrointestinal Panel by PCR , Stool     Status: None   Collection Time: 09/09/19 12:21 PM   Specimen: Stool  Result Value Ref Range Status   Campylobacter species NOT DETECTED NOT DETECTED Final   Plesimonas shigelloides NOT DETECTED NOT DETECTED Final   Salmonella species NOT DETECTED NOT DETECTED Final   Yersinia enterocolitica NOT DETECTED NOT DETECTED Final   Vibrio species NOT DETECTED NOT DETECTED Final   Vibrio cholerae NOT DETECTED NOT DETECTED Final   Enteroaggregative E coli (EAEC)  NOT DETECTED NOT DETECTED Final   Enteropathogenic E coli (EPEC) NOT DETECTED NOT DETECTED Final   Enterotoxigenic E coli (ETEC) NOT DETECTED NOT DETECTED Final   Shiga like toxin producing E coli (STEC) NOT DETECTED NOT DETECTED Final   Shigella/Enteroinvasive E coli (EIEC) NOT DETECTED NOT DETECTED Final   Cryptosporidium NOT DETECTED NOT DETECTED Final   Cyclospora cayetanensis NOT DETECTED NOT DETECTED Final   Entamoeba histolytica NOT DETECTED NOT DETECTED Final   Giardia lamblia NOT DETECTED NOT DETECTED Final   Adenovirus F40/41 NOT DETECTED NOT DETECTED Final   Astrovirus NOT DETECTED NOT DETECTED Final   Norovirus GI/GII NOT DETECTED NOT DETECTED Final   Rotavirus A NOT DETECTED NOT DETECTED Final   Sapovirus (I, II, IV, and V) NOT DETECTED NOT DETECTED Final    Comment: Performed at [facility], [place] [place], [place] [address]  Culture, blood (Routine X 2) w Reflex to ID Panel     Status: None (Preliminary result)   Collection Time: 09/09/19  1:12 PM   Specimen: BLOOD  Result Value Ref Range Status   Specimen Description BLOOD RIGHT ANTECUBITAL  Final   Special Requests   Final    BOTTLES DRAWN AEROBIC AND ANAEROBIC Blood Culture adequate volume   Culture  Setup Time   Final    GRAM POSITIVE COCCI IN CLUSTERS CRITICAL RESULT CALLED TO, READ BACK BY AND VERIFIED WITH: PHARMD SEAY LAURA AT 0546  BY MESSAN HOUEGNIFIO ON 09/10/2019  Organism ID to follow Performed at [facility], [place] [address], [place],  [address]    Culture GRAM POSITIVE COCCI IN CLUSTERS  Final   Report Status PENDING  Incomplete  Culture, blood (Routine X 2) w Reflex to ID Panel     Status: None (Preliminary result)   Collection Time:  09/09/19  1:12 PM   Specimen: BLOOD LEFT HAND  Result Value Ref Range Status   Specimen Description BLOOD LEFT HAND  Final   Special Requests   Final    BOTTLES DRAWN AEROBIC AND ANAEROBIC Blood Culture adequate volume   Culture   Final     NO GROWTH < 24 HOURS Performed at [facility], [address], [place], [place] [address]    Report Status PENDING  Incomplete  Blood Culture ID Panel (Reflexed)     Status: Abnormal   Collection Time: 09/09/19  1:12 PM  Result Value Ref Range Status   Enterococcus species NOT DETECTED NOT DETECTED Final   Listeria monocytogenes NOT DETECTED NOT DETECTED Final   Staphylococcus species NOT DETECTED NOT DETECTED Final   Staphylococcus aureus (BCID) NOT DETECTED NOT DETECTED Final   Streptococcus species DETECTED (A) NOT DETECTED Final    Comment: Not Enterococcus species, Streptococcus agalactiae, Streptococcus pyogenes, or Streptococcus pneumoniae. CRITICAL RESULT CALLED TO, READ BACK BY AND VERIFIED WITH: Criss Rosales PharmD 12:50 09/10/19 ([email or web address])    Streptococcus agalactiae NOT DETECTED NOT DETECTED Final   Streptococcus pneumoniae NOT DETECTED NOT DETECTED Final   Streptococcus pyogenes NOT DETECTED NOT DETECTED Final   Acinetobacter baumannii NOT DETECTED NOT DETECTED Final   Enterobacteriaceae species NOT DETECTED NOT DETECTED Final   Enterobacter cloacae complex NOT DETECTED NOT DETECTED Final   Escherichia coli NOT DETECTED NOT DETECTED Final   Klebsiella oxytoca NOT DETECTED NOT DETECTED Final   Klebsiella pneumoniae NOT DETECTED NOT DETECTED Final   Proteus species NOT DETECTED NOT DETECTED Final   Serratia marcescens NOT DETECTED NOT DETECTED Final   Haemophilus influenzae NOT DETECTED NOT DETECTED Final   Neisseria meningitidis NOT DETECTED NOT DETECTED Final   Pseudomonas aeruginosa NOT DETECTED NOT DETECTED Final   Candida albicans NOT DETECTED NOT DETECTED Final   Candida glabrata NOT DETECTED NOT DETECTED Final   Candida krusei NOT DETECTED NOT DETECTED Final   Candida parapsilosis NOT DETECTED NOT DETECTED Final   Candida tropicalis NOT DETECTED NOT DETECTED Final    Comment: Performed at [facility], [place]. [address], [place], [place] [address]     Radiology Studies: No results found.   Sawyer [place] T. [place]  If 7PM-7AM, please contact night-coverage [email or web address] Password [facility] 09/10/2019, 5:59 PM

## 2019-09-10 NOTE — Progress Notes (Addendum)
Progress Note  Patient Name: [name] Date of Encounter: 09/10/2019  Primary Cardiologist:  Kirk Ruths, MD  Subjective   GI sx have improved SOB has also improved Tmax 102.7 last 24 hr  tolerating ABX well  Inpatient Medications    Scheduled Meds: . atorvastatin  40 mg Oral Q breakfast  . clopidogrel  75 mg Oral Daily  . gabapentin  300 mg Oral Q breakfast  . insulin aspart  0-15 Units Subcutaneous TID WC  . insulin aspart  0-5 Units Subcutaneous QHS  . levothyroxine  50 mcg Oral Q0600  . metoprolol  200 mg Oral Q breakfast  . sodium chloride flush  3 mL Intravenous Once   Continuous Infusions: . cefTRIAXone (ROCEPHIN)  IV 2 g (09/09/19 1610)  . vancomycin     PRN Meds: acetaminophen **OR** acetaminophen, nitroGLYCERIN, ondansetron **OR** ondansetron (ZOFRAN) IV   Vital Signs    Vitals:   09/09/19 1359 09/09/19 2039 09/10/19 0235 09/10/19 0610  BP: 125/76 (!) 96/43  108/62  Pulse: 84 84  83  Resp: 16 20  20   Temp: 98.7 F (37.1 C) (!) 102.7 F (39.3 C) (!) 100.9 F (38.3 C) 99.7 F (37.6 C)  TempSrc: Oral Oral Oral Oral  SpO2: 98% 96%  94%  Weight:    127.3 kg  Height:        Intake/Output Summary (Last 24 hours) at 09/10/2019 0824 Last data filed at 09/09/2019 2230 Gross per 24 hour  Intake 1883.89 ml  Output 951 ml  Net 932.89 ml   Filed Weights   09/08/19 2059 09/09/19 0451 09/10/19 0610  Weight: 128.2 kg 128 kg 127.3 kg   Last Weight  Most recent update: 09/10/2019  7:16 AM   Weight  127.3 kg (280 lb 9.6 oz)           Weight change: -1.996 kg   Telemetry    SR - Personally Reviewed  ECG    None today - Personally Reviewed  Physical Exam   GEN: No acute distress.   Neck: No JVD  Cardiac: RRR, 2-3/6 murmur, crisp valve click, no rubs, or gallops.  Respiratory: clear bilaterally. GI: Soft, nontender, non-distended  MS: No edema; No deformity. Neuro:  Nonfocal  Psych: Normal affect   Labs    Hematology Recent  Labs  Lab 09/08/19 0810 09/09/19 0406 09/10/19 0448  WBC 10.3 7.7 9.4  RBC 3.67* 3.53* 3.68*  HGB 10.4* 10.2* 10.5*  HCT 30.5* 28.7* 29.7*  MCV 83.1 81.3 80.7  MCH 28.3 28.9 28.5  MCHC 34.1 35.5 35.4  RDW 14.5 14.4 14.2  PLT 206 194 213    Chemistry Recent Labs  Lab 09/08/19 0810 09/09/19 0406 09/10/19 0448  NA 136 138 137  K 3.7 3.9 3.5  CL 104 104 104  CO2 21* 24 23  GLUCOSE 153* 187* 181*  BUN 17 13 16   CREATININE 1.31* 1.11 1.50*  CALCIUM 8.7* 8.9 8.6*  PROT  --  7.2  --   ALBUMIN  --  2.5* 2.6*  AST  --  23  --   ALT  --  15  --   ALKPHOS  --  80  --   BILITOT  --  1.1  --   GFRNONAA >60 >60 52*  GFRAA >60 >60 >60  ANIONGAP 11 10 10      High Sensitivity Troponin:   Recent Labs  Lab 09/08/19 0810 09/08/19 1100  TROPONINIHS 34* 28*      BNP Recent  Labs  Lab 09/08/19 1100  BNP 535.2*    Lab Results  Component Value Date   HGBA1C 8.2 (H) 09/08/2019   Lab Results  Component Value Date   INR 3.1 (H) 09/10/2019   INR 2.9 (H) 09/09/2019   INR 2.3 (H) 09/08/2019   PROTIME 15.9 11/06/2008   PROTIME 15.6 10/23/2008   Lab Results  Component Value Date   ESRSEDRATE 124 (H) 09/10/2019   CRP, High Sensitivity  Date Value Ref Range Status  09/09/2019 228.45 (H) 0.00 - 3.00 mg/L Final    Comment:    (NOTE) Results confirmed on dilution.         Relative Risk for Future Cardiovascular Event                             Low                 <1.00                             Average       1.00 - 3.00                             High                >3.00 Performed At: [facility] [place], [place] [id number] Rush Farmer MD [id number]    Blood Culture    Component Value Date/Time   SDES BLOOD RIGHT ANTECUBITAL 09/09/2019 1312   SPECREQUEST  09/09/2019 1312    BOTTLES DRAWN AEROBIC AND ANAEROBIC Blood Culture adequate volume   CULT GRAM POSITIVE COCCI IN CLUSTERS 09/09/2019 1312   REPTSTATUS PENDING 09/09/2019 1312     Radiology    DG Chest 2 View  Result Date: 09/08/2019 CLINICAL DATA:  Dyspnea, nausea, vomiting EXAM: CHEST - 2 VIEW COMPARISON:  10/21/2017 chest radiograph. FINDINGS: Intact sternotomy wires. Aortic valve prosthesis is in place. Stable cardiomediastinal silhouette with mild cardiomegaly. No pneumothorax. No pleural effusion. Lungs appear clear, with no acute consolidative airspace disease and no pulmonary edema. IMPRESSION: Stable mild cardiomegaly without pulmonary edema. No active pulmonary disease. Electronically Signed   By: Ilona Sorrel M.D.   On: 09/08/2019 11:11   ECHOCARDIOGRAM COMPLETE  Result Date: 09/09/2019    ECHOCARDIOGRAM REPORT   Patient Name:   [name] Date of Exam: 09/09/2019 Medical Rec #:  [id number]        Height:       74.0 in Accession #:    [id number]       Weight:       282.2 lb Date of Birth:  04-19-1966        BSA:          2.516 m Patient Age:    54 years         BP:           120/74 mmHg Patient Gender: M                HR:           77 bpm. Exam Location:  Inpatient Procedure: 2D Echo, Cardiac Doppler and Color Doppler Indications:    Dyspnea 786.09  History:        Patient has prior history of Echocardiogram examinations, most  recent 10/24/2017. CAD, Prior CABG, Signs/Symptoms:Shortness of                 Breath and Chest Pain; Risk Factors:Hypertension, Diabetes,                 Dyslipidemia and Non-Smoker. Bentall.                 Aortic Valve: mechanical valve is present in the aortic                 position. Procedure Date: 2000.  Sonographer:    Vickie Epley RDCS Referring Phys: [id number] [place]  1. Tricuspid valve vegetation. Discussed with referring provider.  2. Left ventricular ejection fraction, by estimation, is 55 to 60%. The left ventricle has normal function. The left ventricle has no regional wall motion abnormalities. Left ventricular diastolic parameters were normal.  3. Right ventricular systolic function is  normal. The right ventricular size is mildly enlarged. There is normal pulmonary artery systolic pressure.  4. Left atrial size was moderately dilated.  5. Right atrial size was severely dilated.  6. The mitral valve is normal in structure. Mild mitral valve regurgitation. No evidence of mitral stenosis.  7. There is a 1.8 x 1.1 cm tricuspid valve mass on the atrial surface consistent with vegetation. . Tricuspid valve regurgitation is moderate.  8. Transaortic velocity is higher than expected for mechanical aortic valve. The aortic valve is normal in structure. Aortic valve regurgitation is not visualized. No aortic stenosis is present. There is a mechanical valve present in the aortic position. Procedure Date: 2000. Aortic valve mean gradient measures 33.5 mmHg. Aortic valve Vmax measures 3.68 m/s.  9. Aortic root/ascending aorta has been repaired/replaced and Bentall. 10. The inferior vena cava is normal in size with greater than 50% respiratory variability, suggesting right atrial pressure of 3 mmHg. FINDINGS  Left Ventricle: Left ventricular ejection fraction, by estimation, is 55 to 60%. The left ventricle has normal function. The left ventricle has no regional wall motion abnormalities. The left ventricular internal cavity size was normal in size. There is  no left ventricular hypertrophy. Left ventricular diastolic parameters were normal. Right Ventricle: The right ventricular size is mildly enlarged. No increase in right ventricular wall thickness. Right ventricular systolic function is normal. There is normal pulmonary artery systolic pressure. The tricuspid regurgitant velocity is 2.51  m/s, and with an assumed right atrial pressure of 3 mmHg, the estimated right ventricular systolic pressure is [id number] mmHg. Left Atrium: Left atrial size was moderately dilated. Right Atrium: Right atrial size was severely dilated. Pericardium: There is no evidence of pericardial effusion. Mitral Valve: The mitral valve is  normal in structure. Normal mobility of the mitral valve leaflets. Mild mitral annular calcification. Mild mitral valve regurgitation. No evidence of mitral valve stenosis. Tricuspid Valve: There is a 1.8 x 1.1 cm tricuspid valve mass on the atrial surface consistent with vegetation. The tricuspid valve is normal in structure. Tricuspid valve regurgitation is moderate . No evidence of tricuspid stenosis. Aortic Valve: Transaortic velocity is higher than expected for mechanical aortic valve. The aortic valve is normal in structure. Aortic valve regurgitation is not visualized. No aortic stenosis is present. Aortic valve mean gradient measures 33.5 mmHg. Aortic valve peak gradient measures 54.3 mmHg. Aortic valve area, by VTI measures 0.93 cm. There is a mechanical valve present in the aortic position. Procedure Date: 2000. Pulmonic Valve: The pulmonic valve was normal in structure. Pulmonic valve regurgitation is  mild. No evidence of pulmonic stenosis. Aorta: The aortic root/ascending aorta has been repaired/replaced and Bentall. Venous: The inferior vena cava is normal in size with greater than 50% respiratory variability, suggesting right atrial pressure of 3 mmHg. IAS/Shunts: No atrial level shunt detected by color flow Doppler.  LEFT VENTRICLE PLAX 2D LVIDd:         5.60 cm      Diastology LVIDs:         4.60 cm      LV e' lateral:   13.10 cm/s LV PW:         1.10 cm      LV E/e' lateral: 5.7 LV IVS:        1.10 cm      LV e' medial:    8.49 cm/s LVOT diam:     2.20 cm      LV E/e' medial:  8.8 LV SV:         73 LV SV Index:   29 LVOT Area:     3.80 cm  LV Volumes (MOD) LV vol d, MOD A2C: 163.0 ml LV vol d, MOD A4C: 157.0 ml LV vol s, MOD A2C: 88.8 ml LV vol s, MOD A4C: 72.8 ml LV SV MOD A2C:     74.2 ml LV SV MOD A4C:     157.0 ml LV SV MOD BP:      77.9 ml RIGHT VENTRICLE RV S prime:     9.25 cm/s TAPSE (M-mode): 1.8 cm LEFT ATRIUM             Index       RIGHT ATRIUM           Index LA diam:        5.80 cm  2.31 cm/m  RA Area:     37.80 cm LA Vol (A2C):   65.8 ml 26.15 ml/m RA Volume:   141.00 ml 56.04 ml/m LA Vol (A4C):   76.5 ml 30.41 ml/m LA Biplane Vol: 77.8 ml 30.92 ml/m  AORTIC VALVE AV Area (Vmax):    0.95 cm AV Area (Vmean):   0.88 cm AV Area (VTI):     0.93 cm AV Vmax:           368.50 cm/s AV Vmean:          273.000 cm/s AV VTI:            0.786 m AV Peak Grad:      54.3 mmHg AV Mean Grad:      33.5 mmHg LVOT Vmax:         92.00 cm/s LVOT Vmean:        63.300 cm/s LVOT VTI:          0.193 m LVOT/AV VTI ratio: 0.25  AORTA Ao Root diam: 2.90 cm MITRAL VALVE               TRICUSPID VALVE MV Area (PHT): 5.02 cm    TR Peak grad:   25.2 mmHg MV Decel Time: 151 msec    TR Vmax:        251.00 cm/s MR Peak grad: 107.3 mmHg MR Vmax:      518.00 cm/s  SHUNTS MV E velocity: 74.60 cm/s  Systemic VTI:  0.19 m MV A velocity: 37.70 cm/s  Systemic Diam: 2.20 cm MV E/A ratio:  1.98 Candee Furbish MD Electronically signed by Candee Furbish MD Signature Date/Time: 09/09/2019/12:06:06 PM    Final  Cardiac Studies   ECHO: 09/09/2019 1. Tricuspid valve vegetation. Discussed with referring provider.  2. Left ventricular ejection fraction, by estimation, is 55 to 60%. The  left ventricle has normal function. The left ventricle has no regional  wall motion abnormalities. Left ventricular diastolic parameters were  normal.  3. Right ventricular systolic function is normal. The right ventricular  size is mildly enlarged. There is normal pulmonary artery systolic  pressure.  4. Left atrial size was moderately dilated.  5. Right atrial size was severely dilated.  6. The mitral valve is normal in structure. Mild mitral valve  regurgitation. No evidence of mitral stenosis.  7. There is a 1.8 x 1.1 cm tricuspid valve mass on the atrial surface  consistent with vegetation. . Tricuspid valve regurgitation is moderate.  8. Transaortic velocity is higher than expected for mechanical aortic  valve. The aortic  valve is normal in structure. Aortic valve regurgitation  is not visualized. No aortic stenosis is present. There is a mechanical  valve present in the aortic  position. Procedure Date: 2000. Aortic valve mean gradient measures 33.5  mmHg. Aortic valve Vmax measures 3.68 m/s.  9. Aortic root/ascending aorta has been repaired/replaced and Bentall.  10. The inferior vena cava is normal in size with greater than 50%  respiratory variability, suggesting right atrial pressure of 3 mmHg.   Patient Profile     54 y.o. male w/ hx Deneen Harts & MDT tilting disc AVR in 2000 with CABG, SVG-RCA PCI with BMS in Feb 2015, he had re-do PCI in June 2019 DES to SVG graft, afib on coumadin, TIA in 2004, NIDDM, HTN, who was admitted 04/18 with dyspnea, N&V, diarrhea.  Assessment & Plan    1. Infective endocarditis:  - tricuspid valve vegetation on echo, mod TR - on ABX per ID - Cr trending up, will need to watch closely - w/ Cr elevation and borderline BP>> borderline sepsis so is off BB, diuretics - TEE scheduled for 04/21, orders written - [facility] has been requested to perform a transesophageal echocardiogram on 04/21 for bacteremia.  After careful review of history and examination, the risks and benefits of transesophageal echocardiogram have been explained including risks of esophageal damage, perforation (1:10,000 risk), bleeding, pharyngeal hematoma as well as other potential complications associated with conscious sedation including aspiration, arrhythmia, respiratory failure and death. Alternatives to treatment were discussed, questions were answered. Patient is willing to proceed.  - warfarin and Plavix on hold  2. CAD s/p CABG w/ subsequent PCIs and Bental procedure w/ mech AVR - coumadin on hold - no ischemic sx - mild trop elevation felt demand ischemia, no further eval indicated at this time - since off Plavix, on ASA  Otherwise, per IM Principal Problem:   Shortness of breath Active  Problems:   Diabetes mellitus type II, non insulin dependent (HCC)   Hyperlipidemia   Essential hypertension   PAF (paroxysmal atrial fibrillation) (HCC)   Hypothyroidism   Normocytic anemia   Obesity (BMI 30-39.9)   CAD (coronary artery disease)   Nausea vomiting and diarrhea  Signed, Rosaria Ferries , PA-C 8:24 AM 09/10/2019   Patient seen and examined.  Agree with above documentation.On exam, patient is alert and oriented, regular rate and rhythm, mechanical click, 2/6 systolic murmur, lungs CTAB, no LE edema or JVD.  Blood cultures growing gram-positive cocci in clusters.  Continues to spike fevers.  Started on vancomycin and ceftriaxone per ID.  Plan for TEE tomorrow to assess for mechanical aortic  valve endocarditis and further evaluate tricuspid valve vegetation.  Holding home antihypertensives in setting of bacteremia.  Holding warfarin and will let INR drift down, plan for heparin drip once INR less than 2.5.  Hold Plavix and instead start aspirin 81 mg daily in case surgery is needed.  Worsening renal function today, will hold Lasix and losartan.  He does not appear volume overloaded on exam.  TTE yesterday showed IVC was small/collapsible, suggesting low right atrial pressure.  Donato Heinz, MD

## 2019-09-10 NOTE — Progress Notes (Signed)
Inpatient Diabetes Program Recommendations  AACE/ADA: New Consensus Statement on Inpatient Glycemic Control (2015)  Target Ranges:  Prepandial:   less than 140 mg/dL      Peak postprandial:   less than 180 mg/dL (1-2 hours)      Critically ill patients:  140 - 180 mg/dL   Lab Results  Component Value Date   GLUCAP 278 (H) 09/10/2019   HGBA1C 8.2 (H) 09/08/2019    Review of Glycemic Control Results for [name], [name] (MRN [id number]) as of 09/10/2019 14:18  Ref. Range 09/09/2019 11:05 09/09/2019 16:18 09/09/2019 20:56 09/10/2019 08:04 09/10/2019 11:44  Glucose-Capillary Latest Ref Range: 70 - 99 mg/dL 175 (H) 271 (H) 212 (H) 189 (H) 278 (H)   Diabetes history: DM 2 Outpatient Diabetes medications: Amaryl 3 mg daily with breakfast, Metformin 1000 mg bid Current orders for Inpatient glycemic control:  Novolog 0-15 units tid with meals and HS Inpatient Diabetes Program Recommendations:   May consider adding Lantus 20 units daily.  Also consider adding Novolog meal coverage 3 units tid with meals.   Thanks,  Adah Perl, RN, BC-ADM Inpatient Diabetes Coordinator Pager [phone] (8a-5p)

## 2019-09-10 NOTE — Progress Notes (Signed)
PHARMACY - PHYSICIAN COMMUNICATION CRITICAL VALUE ALERT - BLOOD CULTURE IDENTIFICATION (BCID)  BCID: 1/4 GPC strep species  Current therapy: Patient currently on ceftriaxone 2g q24 hours and vancomycin per pharmacy.   No change recommended  Results for orders placed or performed during the hospital encounter of 09/08/19  Blood Culture ID Panel (Reflexed) (Collected: 09/09/2019  1:12 PM)  Result Value Ref Range   Enterococcus species NOT DETECTED NOT DETECTED   Listeria monocytogenes NOT DETECTED NOT DETECTED   Staphylococcus species NOT DETECTED NOT DETECTED   Staphylococcus aureus (BCID) NOT DETECTED NOT DETECTED   Streptococcus species DETECTED (A) NOT DETECTED   Streptococcus agalactiae NOT DETECTED NOT DETECTED   Streptococcus pneumoniae NOT DETECTED NOT DETECTED   Streptococcus pyogenes NOT DETECTED NOT DETECTED   Acinetobacter baumannii NOT DETECTED NOT DETECTED   Enterobacteriaceae species NOT DETECTED NOT DETECTED   Enterobacter cloacae complex NOT DETECTED NOT DETECTED   Escherichia coli NOT DETECTED NOT DETECTED   Klebsiella oxytoca NOT DETECTED NOT DETECTED   Klebsiella pneumoniae NOT DETECTED NOT DETECTED   Proteus species NOT DETECTED NOT DETECTED   Serratia marcescens NOT DETECTED NOT DETECTED   Haemophilus influenzae NOT DETECTED NOT DETECTED   Neisseria meningitidis NOT DETECTED NOT DETECTED   Pseudomonas aeruginosa NOT DETECTED NOT DETECTED   Candida albicans NOT DETECTED NOT DETECTED   Candida glabrata NOT DETECTED NOT DETECTED   Candida krusei NOT DETECTED NOT DETECTED   Candida parapsilosis NOT DETECTED NOT DETECTED   Candida tropicalis NOT DETECTED NOT DETECTED    Name of physician (or Provider) Contacted: ID has already seen patient   Changes to prescribed antibiotics required: None  Sherren Kerns, PharmD PGY1 Acute Care Pharmacy Resident 09/10/2019  1:20 PM

## 2019-09-10 NOTE — Progress Notes (Signed)
Pharmacy Antibiotic Note  [name] is a 54 y.o. male admitted on 09/08/2019 with GI upset and SOB, also reported fevers. Pt has hx mechanical AVR found to have TV vegetation on TTE 4/19.  Pharmacy has been consulted for vancomycin dosing for endocarditis.  SCr has increased overnight from 1.11 to 1.5, blood cultures growing GPCs in clusters (BCID pending).  Plan: Adjust vancomycin to 1750mg  IV q24h - est AUC 469 Continue ceftriaxone 2g IV q24h Check vancomycin levels at Css Follow Cr, cultures, ID recs   Height: 6\' 2"  (188 cm) Weight: 127.3 kg (280 lb 9.6 oz) IBW/kg (Calculated) : 82.2  Temp (24hrs), Avg:100.5 F (38.1 C), Min:98.7 F (37.1 C), Max:102.7 F (39.3 C)  Recent Labs  Lab 09/08/19 0810 09/09/19 0406 09/10/19 0448  WBC 10.3 7.7 9.4  CREATININE 1.31* 1.11 1.50*    Estimated Creatinine Clearance: 79.8 mL/min (A) (by C-G formula based on SCr of 1.5 mg/dL (H)).    Allergies  Allergen Reactions  . Diltiazem Hives  . Insulin Glargine Swelling    edema    Antimicrobials this admission: Vancomycin 4/19 >>  Microbiology results: sent  Thank you for allowing pharmacy to be a part of this patient's care.   Arrie Senate, PharmD, BCPS Clinical Pharmacist [phone] Please check AMION for all [place] numbers 09/10/2019

## 2019-09-10 NOTE — Progress Notes (Signed)
Pharmacy Note 1/2 BC with gpc in clusters, no BCID.  Continue vancomycin and ceftriaxone Excell Seltzer, PharmD

## 2019-09-10 NOTE — Progress Notes (Signed)
[facility]  Date of Admission:  09/08/2019      Total days of antibiotics 2  Vancomycin   Ceftriaxone          ASSESSMENT: [name] is a 54 y.o. male with 1 week history of chills/malaise here with vegetation identified on tricuspid valve with TTE; he is growing gram positive clusters in 1/2 blood culture sites - we called [facility] to set up for BCID for early identification. Will adjust antibiotics once pathogen identified - with suspicion for prosthetic valve involvement will likely need to add combination therapy.   He has subjective change in valve click with a fairly loud murmur heard on exam - worrisome for prosthetic valve involvement. Awaiting TEE tomorrow afternoon to further evaluate. He has no further episodes of diarrhea / abdominal pain. He is experiencing back pain that is located more to right posterior rib cage/intercostal muscles --> ?related to his endocarditis. CXR without any finding for septic pulmonary emboli. With tricuspid valve involved will need to watch for septic pulmonary emboli. Normal lung exam today.    His PIV is not functioning well - I spoke with his nurse to see if we can re-establish site especially with vancomycin infusing.   Creatinine elevation to 1.5 noted - will need close monitoring on vancomycin.    PLAN: 1. Follow for BCID to ID pathogen  2. Continue Vancomycin + Ceftriaxone for now 3. Follow repeat cultures 4. Nursing to evaluate PIV - please hold on PICC for now if possible 5. Follow up TEE 4/21     Principal Problem:   Suspected endocarditis mechanical aortic valve Active Problems:   Gram-positive cocci bacteremia   Endocarditis of tricuspid valve   Diabetes mellitus type II, non insulin dependent (HCC)   Hyperlipidemia   Essential hypertension   PAF (paroxysmal atrial fibrillation) (HCC)   Hypothyroidism   Normocytic anemia   Shortness of breath   Obesity (BMI 30-39.9)   CAD  (coronary artery disease)   Nausea vomiting and diarrhea   . aspirin EC  81 mg Oral Daily  . atorvastatin  40 mg Oral Q breakfast  . gabapentin  300 mg Oral Q breakfast  . insulin aspart  0-15 Units Subcutaneous TID WC  . insulin aspart  0-5 Units Subcutaneous QHS  . levothyroxine  50 mcg Oral Q0600  . sodium chloride flush  3 mL Intravenous Once    SUBJECTIVE: He is feeling better today - while fevers have not completely abated he is no longer having shaking chills. He has no trouble with antibiotics but worried about his PIV as it is leaking and painful.   He states that his valve click has gotten notably louder recently. Denies any cough or significant shortness of breath at rest. He has been experiencing right thoracic back pain prior to hospitalization but not currently bothering him as long as he does not rest on the left side. No dental pain and no wounds noted recently.    Review of Systems: Review of Systems  Constitutional: Positive for chills, fever and malaise/fatigue.  Respiratory: Negative for cough and shortness of breath.   Cardiovascular: Negative for chest pain, palpitations and leg swelling.  Gastrointestinal: Negative for abdominal pain, diarrhea and vomiting.  Genitourinary: Negative for dysuria.  Musculoskeletal: Positive for back pain. Negative for joint pain and neck pain.  Skin: Negative for itching and rash.  Neurological: Negative for dizziness, focal weakness and weakness.  Allergies  Allergen Reactions  . Diltiazem Hives  . Insulin Glargine Swelling    edema    OBJECTIVE: Vitals:   09/09/19 1359 09/09/19 2039 09/10/19 0235 09/10/19 0610  BP: 125/76 (!) 96/43  108/62  Pulse: 84 84  83  Resp: 16 20  20   Temp: 98.7 F (37.1 C) (!) 102.7 F (39.3 C) (!) 100.9 F (38.3 C) 99.7 F (37.6 C)  TempSrc: Oral Oral Oral Oral  SpO2: 98% 96%  94%  Weight:    127.3 kg  Height:       Body mass index is 36.03 kg/m.  Physical Exam Vitals and  nursing note reviewed.  HENT:     Mouth/Throat:     Mouth: No oral lesions.     Dentition: Normal dentition. No dental caries.  Eyes:     General: No scleral icterus. Cardiovascular:     Rate and Rhythm: Normal rate and regular rhythm.     Heart sounds: Normal heart sounds.     Comments: Premature atrial beats on tele. Loud valve click heard standing close to patient with systolic murmur.  Pulmonary:     Effort: Pulmonary effort is normal.     Breath sounds: Normal breath sounds. No decreased breath sounds.  Abdominal:     General: There is no distension.     Palpations: Abdomen is soft.     Tenderness: There is no abdominal tenderness.  Musculoskeletal:     Thoracic back: No swelling, spasms, tenderness or bony tenderness. Normal range of motion.       Back:     Comments: Area marked is where patient has been experiencing pain. No reproducible tenderness on exam and no deformity observed.   Lymphadenopathy:     Cervical: No cervical adenopathy.  Skin:    General: Skin is warm and dry.     Findings: No rash.  Neurological:     Mental Status: He is alert and oriented to person, place, and time.     Lab Results Lab Results  Component Value Date   WBC 9.4 09/10/2019   HGB 10.5 (L) 09/10/2019   HCT 29.7 (L) 09/10/2019   MCV 80.7 09/10/2019   PLT 213 09/10/2019    Lab Results  Component Value Date   CREATININE 1.50 (H) 09/10/2019   BUN 16 09/10/2019   NA 137 09/10/2019   K 3.5 09/10/2019   CL 104 09/10/2019   CO2 23 09/10/2019    Lab Results  Component Value Date   ALT 15 09/09/2019   AST 23 09/09/2019   ALKPHOS 80 09/09/2019   BILITOT 1.1 09/09/2019     Microbiology: Recent Results (from the past 240 hour(s))  SARS CORONAVIRUS 2 (TAT 6-24 HRS) Nasopharyngeal Nasopharyngeal Swab     Status: None   Collection Time: 09/08/19  1:50 PM   Specimen: Nasopharyngeal Swab  Result Value Ref Range Status   SARS Coronavirus 2 NEGATIVE NEGATIVE Final    Comment:  (NOTE) SARS-CoV-2 target nucleic acids are NOT DETECTED. The SARS-CoV-2 RNA is generally detectable in upper and lower respiratory specimens during the acute phase of infection. Negative results do not preclude SARS-CoV-2 infection, do not rule out co-infections with other pathogens, and should not be used as the sole basis for treatment or other patient management decisions. Negative results must be combined with clinical observations, patient history, and epidemiological information. The expected result is Negative. Fact Sheet for Patients: SugarRoll.be Fact Sheet for Healthcare Providers: https://www.woods-mathews.com/ This test is not yet approved  or cleared by the [place] and  has been authorized for detection and/or diagnosis of SARS-CoV-2 by FDA under an Emergency Use Authorization (EUA). This EUA will remain  in effect (meaning this test can be used) for the duration of the COVID-19 declaration under Section 56 4(b)(1) of the Act, 21 U.S.C. section 360bbb-3(b)(1), unless the authorization is terminated or revoked sooner. Performed at [facility], [place] [address], [place], [place] [address]   Gastrointestinal Panel by PCR , Stool     Status: None   Collection Time: 09/09/19 12:21 PM   Specimen: Stool  Result Value Ref Range Status   Campylobacter species NOT DETECTED NOT DETECTED Final   Plesimonas shigelloides NOT DETECTED NOT DETECTED Final   Salmonella species NOT DETECTED NOT DETECTED Final   Yersinia enterocolitica NOT DETECTED NOT DETECTED Final   Vibrio species NOT DETECTED NOT DETECTED Final   Vibrio cholerae NOT DETECTED NOT DETECTED Final   Enteroaggregative E coli (EAEC) NOT DETECTED NOT DETECTED Final   Enteropathogenic E coli (EPEC) NOT DETECTED NOT DETECTED Final   Enterotoxigenic E coli (ETEC) NOT DETECTED NOT DETECTED Final   Shiga like toxin producing E coli (STEC) NOT DETECTED NOT DETECTED Final    Shigella/Enteroinvasive E coli (EIEC) NOT DETECTED NOT DETECTED Final   Cryptosporidium NOT DETECTED NOT DETECTED Final   Cyclospora cayetanensis NOT DETECTED NOT DETECTED Final   Entamoeba histolytica NOT DETECTED NOT DETECTED Final   Giardia lamblia NOT DETECTED NOT DETECTED Final   Adenovirus F40/41 NOT DETECTED NOT DETECTED Final   Astrovirus NOT DETECTED NOT DETECTED Final   Norovirus GI/GII NOT DETECTED NOT DETECTED Final   Rotavirus A NOT DETECTED NOT DETECTED Final   Sapovirus (I, II, IV, and V) NOT DETECTED NOT DETECTED Final    Comment: Performed at [facility], [address] [place], [place] [address]  Culture, blood (Routine X 2) w Reflex to ID Panel     Status: None (Preliminary result)   Collection Time: 09/09/19  1:12 PM   Specimen: BLOOD  Result Value Ref Range Status   Specimen Description BLOOD RIGHT ANTECUBITAL  Final   Special Requests   Final    BOTTLES DRAWN AEROBIC AND ANAEROBIC Blood Culture adequate volume   Culture  Setup Time   Final    GRAM POSITIVE COCCI IN CLUSTERS CRITICAL RESULT CALLED TO, READ BACK BY AND VERIFIED WITH: PHARMD SEAY LAURA AT 0546  BY MESSAN HOUEGNIFIO ON 09/10/2019  Organism ID to follow Performed at [facility], [place] [address], [place], [place] [address]    Culture GRAM POSITIVE COCCI IN CLUSTERS  Final   Report Status PENDING  Incomplete  Culture, blood (Routine X 2) w Reflex to ID Panel     Status: None (Preliminary result)   Collection Time: 09/09/19  1:12 PM   Specimen: BLOOD LEFT HAND  Result Value Ref Range Status   Specimen Description BLOOD LEFT HAND  Final   Special Requests   Final    BOTTLES DRAWN AEROBIC AND ANAEROBIC Blood Culture adequate volume   Culture   Final    NO GROWTH < 24 HOURS Performed at [facility], [place] [address], [place], [place] [address]    Report Status PENDING  Incomplete     Janene Madeira, MSN, NP-C [facility] [email or web address] Pager: [phone] Office: [phone] [place]: [phone]

## 2019-09-10 NOTE — Progress Notes (Signed)
[facility] for warfarin Indication: atrial fibrillation + mechanical AVR  Allergies  Allergen Reactions  . Diltiazem Hives  . Insulin Glargine Swelling    edema    Patient Measurements: Height: 6\' 2"  (188 cm) Weight: 127.3 kg (280 lb 9.6 oz) IBW/kg (Calculated) : 82.2  Vital Signs: Temp: 99.7 F (37.6 C) (04/20 0610) Temp Source: Oral (04/20 0610) BP: 108/62 (04/20 0610) Pulse Rate: 83 (04/20 0610)  Labs: Recent Labs    09/08/19 0810 09/08/19 0810 09/08/19 1100 09/09/19 0406 09/10/19 0448  HGB 10.4*   < >  --  10.2* 10.5*  HCT 30.5*  --   --  28.7* 29.7*  PLT 206  --   --  194 213  LABPROT  --   --  25.5* 30.4* 32.0*  INR  --   --  2.3* 2.9* 3.1*  CREATININE 1.31*  --   --  1.11 1.50*  TROPONINIHS 34*  --  28*  --   --    < > = values in this interval not displayed.    Estimated Creatinine Clearance: 79.8 mL/min (A) (by C-G formula based on SCr of 1.5 mg/dL (H)).   Medical History: Past Medical History:  Diagnosis Date  . Ascending aortic dissection ([place])    a. 2000 - with aortic valve involvement. S/p repair of aortic dissection with placement of mechanical AVR.  [name] CAD (coronary artery disease)    a. 2000 VG->PDA @ time of Ao dissection repair;  b. Cardiac CT 06/2010: no CAD, only mild plaque in prox RCA;  c. 2015 Cath: LM nl, LAD nl, LCX nl, RCA 100, VG->PDA 90 (4.5x12 Rebel BMS). d. 10/23/17 instent restenosis SVG to RCA, DES placed.  . Cholelithiasis 08/22/2013  . Chronic diastolic CHF (congestive heart failure) ([place])    a. 03/2016 Echo: EF 55-60%, no rwma, Gr2 DD.  . Diabetes mellitus   . Dyspnea   . Gross hematuria   . H/O mechanical aortic valve replacement    a. 03/2016 Echo: AoV mean gradient 42mmHg, Valve Area (VTI) 1.36 cm^2, (Vmax) 1.22 cm^2, mod dil LA.  [name] Hyperlipidemia   . Hypertension   . NEPHROLITHIASIS, HX OF   . Obesity   . PAF (paroxysmal atrial fibrillation) ([place])    a. H/o such, with recurrence of  coarse afib/flutter during ER consult 03/2012.  . Stroke ([place])   . TRANSIENT ISCHEMIC ATTACK, HX OF    a. In 2004.  [name] Unspecified vitamin D deficiency    Assessment: 55 YOM presenting with SOB and N/V, on warfarin PTA for Afib and mechanical AVR. Vegetation seen on TTE, will need to hold warfarin and begin IV heparin with impending need for procedures.  INR this morning therapeutic at 3.1, CBC stable.  PTA dosing: 5mg  MWF, 7.5mg  all other days  Goal of Therapy:  INR 2.5-3.5 Monitor platelets by anticoagulation protocol: Yes   Plan:  -Hold warfarin -INR in the am - begin heparin once < 2.5   Arrie Senate, PharmD, BCPS Clinical Pharmacist [phone] Please check AMION for all [facility] numbers 09/10/2019

## 2019-09-10 NOTE — Plan of Care (Signed)

## 2019-09-11 ENCOUNTER — Inpatient Hospital Stay (HOSPITAL_COMMUNITY): Payer: Medicare PPO | Admitting: Anesthesiology

## 2019-09-11 ENCOUNTER — Inpatient Hospital Stay (HOSPITAL_COMMUNITY): Payer: Medicare PPO

## 2019-09-11 ENCOUNTER — Encounter: Payer: Self-pay | Admitting: *Deleted

## 2019-09-11 ENCOUNTER — Encounter (HOSPITAL_COMMUNITY)
Admission: EM | Disposition: A | Discharge status: Home Health | Payer: Self-pay | Source: Home / Self Care | Attending: Surgery

## 2019-09-11 ENCOUNTER — Other Ambulatory Visit: Payer: Self-pay | Admitting: *Deleted

## 2019-09-11 DIAGNOSIS — K036 Deposits [accretions] on teeth: Secondary | ICD-10-CM

## 2019-09-11 DIAGNOSIS — I079 Rheumatic tricuspid valve disease, unspecified: Secondary | ICD-10-CM

## 2019-09-11 DIAGNOSIS — R0989 Other specified symptoms and signs involving the circulatory and respiratory systems: Secondary | ICD-10-CM | POA: Diagnosis not present

## 2019-09-11 DIAGNOSIS — K0530 Chronic periodontitis, unspecified: Secondary | ICD-10-CM

## 2019-09-11 DIAGNOSIS — K045 Chronic apical periodontitis: Secondary | ICD-10-CM

## 2019-09-11 DIAGNOSIS — K0889 Other specified disorders of teeth and supporting structures: Secondary | ICD-10-CM

## 2019-09-11 DIAGNOSIS — I2510 Atherosclerotic heart disease of native coronary artery without angina pectoris: Secondary | ICD-10-CM | POA: Diagnosis not present

## 2019-09-11 DIAGNOSIS — I340 Nonrheumatic mitral (valve) insufficiency: Secondary | ICD-10-CM

## 2019-09-11 DIAGNOSIS — K083 Retained dental root: Secondary | ICD-10-CM

## 2019-09-11 DIAGNOSIS — R7881 Bacteremia: Secondary | ICD-10-CM | POA: Diagnosis not present

## 2019-09-11 DIAGNOSIS — I480 Paroxysmal atrial fibrillation: Secondary | ICD-10-CM | POA: Diagnosis not present

## 2019-09-11 DIAGNOSIS — K029 Dental caries, unspecified: Secondary | ICD-10-CM

## 2019-09-11 HISTORY — PX: TEE WITHOUT CARDIOVERSION: SHX5443

## 2019-09-11 LAB — GLUCOSE, CAPILLARY
Glucose-Capillary: 188 mg/dL — ABNORMAL HIGH (ref 70–99)
Glucose-Capillary: 158 mg/dL — ABNORMAL HIGH (ref 70–99)
Glucose-Capillary: 163 mg/dL — ABNORMAL HIGH (ref 70–99)
Glucose-Capillary: 225 mg/dL — ABNORMAL HIGH (ref 70–99)
Glucose-Capillary: 159 mg/dL — ABNORMAL HIGH (ref 70–99)

## 2019-09-11 LAB — BASIC METABOLIC PANEL
Anion gap: 7 (ref 5–15)
BUN: 15 mg/dL (ref 6–20)
CO2: 22 mmol/L (ref 22–32)
Calcium: 8.4 mg/dL — ABNORMAL LOW (ref 8.9–10.3)
Chloride: 108 mmol/L (ref 98–111)
Creatinine, Ser: 1.27 mg/dL — ABNORMAL HIGH (ref 0.61–1.24)
GFR calc Af Amer: >60 mL/min (ref >60)
GFR calc non Af Amer: >60 mL/min (ref >60)
Glucose, Bld: 203 mg/dL — ABNORMAL HIGH (ref 70–99)
Potassium: 3.3 mmol/L — ABNORMAL LOW (ref 3.5–5.1)
Sodium: 137 mmol/L (ref 135–145)

## 2019-09-11 LAB — PROTIME-INR
INR: 3.2 — ABNORMAL HIGH (ref 0.8–1.2)
Prothrombin Time: 32.6 seconds — ABNORMAL HIGH (ref 11.4–15.2)

## 2019-09-11 IMAGING — DX DG ORTHOPANTOGRAM /PANORAMIC
1 series · 1 of 1 positions shown · non-contrast
Comparison: None.

CLINICAL DATA: Pre-op testing. Molar pain.

EXAM:
ORTHOPANTOGRAM/PANORAMIC

[view not recorded]
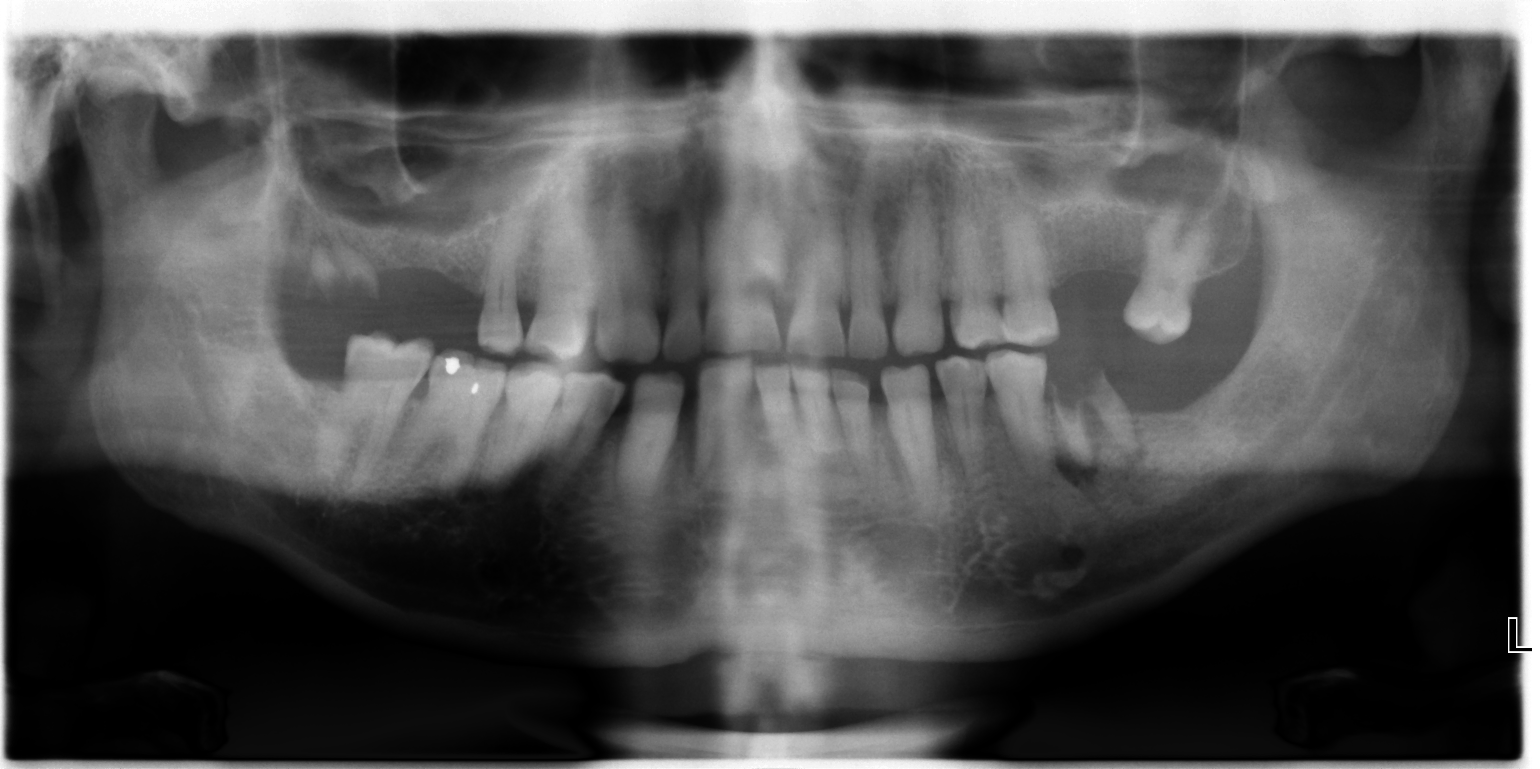

[1 of 1 positions shown; findings below may reference images not displayed]

FINDINGS: Several teeth are absent. There is typical midline artifact of the
Panorex technique. Significant dental caries involving the right
maxillary wisdom tooth and the most posterior remaining left
mandibular molar. That tooth also demonstrates significant
periodontal disease. No evidence of acute fracture or dislocation.
IMPRESSION: Dental caries and periodontal disease as described.

## 2019-09-11 SURGERY — ECHOCARDIOGRAM, TRANSESOPHAGEAL
Anesthesia: Monitor Anesthesia Care

## 2019-09-11 MED ORDER — PROPOFOL 500 MG/50ML IV EMUL
INTRAVENOUS | Status: DC | PRN
  Administered 2019-09-11: 150 ug/kg/min via INTRAVENOUS

## 2019-09-11 MED ORDER — GENTAMICIN SULFATE 40 MG/ML IJ SOLN
300.0000 mg | INTRAVENOUS | Status: DC
  Administered 2019-09-11 – 2019-09-17 (×7): 300 mg via INTRAVENOUS
  Filled 2019-09-11 (×10): qty 7.5

## 2019-09-11 MED ORDER — METOPROLOL TARTRATE 100 MG PO TABS
100.0000 mg | ORAL_TABLET | Freq: Once | ORAL | Status: AC
  Administered 2019-09-11: 17:00:00 100 mg via ORAL
  Filled 2019-09-11: qty 1

## 2019-09-11 MED ORDER — PROPOFOL 10 MG/ML IV BOLUS
INTRAVENOUS | Status: DC | PRN
  Administered 2019-09-11 (×4): 20 mg via INTRAVENOUS

## 2019-09-11 MED ORDER — METOPROLOL TARTRATE 100 MG PO TABS
100.0000 mg | ORAL_TABLET | Freq: Once | ORAL | Status: AC
  Administered 2019-09-12: 100 mg via ORAL
  Filled 2019-09-11: qty 1

## 2019-09-11 MED ORDER — PHENYLEPHRINE HCL (PRESSORS) 10 MG/ML IV SOLN
INTRAVENOUS | Status: DC | PRN
  Administered 2019-09-11 (×2): 80 ug via INTRAVENOUS

## 2019-09-11 NOTE — Progress Notes (Addendum)
Progress Note  Patient Name: [name] Date of Encounter: 09/11/2019  Primary Cardiologist: Kirk Ruths, MD   Subjective   Pt resting comfortably. Ready for TEE. All questions answered  Inpatient Medications    Scheduled Meds: . aspirin EC  81 mg Oral Daily  . atorvastatin  40 mg Oral Q breakfast  . gabapentin  300 mg Oral Q breakfast  . insulin aspart  0-15 Units Subcutaneous TID WC  . insulin aspart  0-5 Units Subcutaneous QHS  . insulin aspart  6 Units Subcutaneous TID WC  . insulin detemir  6 Units Subcutaneous BID  . levothyroxine  50 mcg Oral Q0600  . sodium chloride flush  3 mL Intravenous Once   Continuous Infusions: . sodium chloride    . cefTRIAXone (ROCEPHIN)  IV 2 g (09/10/19 1421)  . vancomycin 1,750 mg (09/10/19 2220)   PRN Meds: acetaminophen **OR** acetaminophen, nitroGLYCERIN, ondansetron **OR** ondansetron (ZOFRAN) IV   Vital Signs    Vitals:   09/10/19 0610 09/10/19 1300 09/10/19 2124 09/11/19 0458  BP: 108/62 117/60 118/68 108/65  Pulse: 83 90 88 85  Resp: 20 20 20 20   Temp: 99.7 F (37.6 C) 98 F (36.7 C) 98.2 F (36.8 C) 98.2 F (36.8 C)  TempSrc: Oral Oral Oral Oral  SpO2: 94% 97% 98% 98%  Weight: 127.3 kg   127 kg  Height:        Intake/Output Summary (Last 24 hours) at 09/11/2019 0941 Last data filed at 09/11/2019 [id number] Gross per 24 hour  Intake 720 ml  Output 1500 ml  Net -780 ml   Last 3 Weights 09/11/2019 09/10/2019 09/09/2019  Weight (lbs) 279 lb 15.8 oz 280 lb 9.6 oz 282 lb 3 oz  Weight (kg) 127 kg 127.279 kg 128 kg      Telemetry    Sinus rhythm HR in the 70-80s, PVCs, appears to have prolonged PR interval - Personally Reviewed  ECG    No new tracings - Personally Reviewed  Physical Exam   GEN: No acute distress.   Neck: No JVD Cardiac: RRR, sharp valve click  Respiratory: Clear to auscultation bilaterally. GI: Soft, nontender, non-distended  MS: No edema; No deformity. Neuro:  Nonfocal  Psych: Normal  affect   Labs    High Sensitivity Troponin:   Recent Labs  Lab 09/08/19 0810 09/08/19 1100  TROPONINIHS 34* 28*      Chemistry Recent Labs  Lab 09/09/19 0406 09/10/19 0448 09/11/19 0508  NA 138 137 137  K 3.9 3.5 3.3*  CL 104 104 108  CO2 24 23 22   GLUCOSE 187* 181* 203*  BUN 13 16 15   CREATININE 1.11 1.50* 1.27*  CALCIUM 8.9 8.6* 8.4*  PROT 7.2  --   --   ALBUMIN 2.5* 2.6*  --   AST 23  --   --   ALT 15  --   --   ALKPHOS 80  --   --   BILITOT 1.1  --   --   GFRNONAA >60 52* >60  GFRAA >60 >60 >60  ANIONGAP 10 10 7      Hematology Recent Labs  Lab 09/08/19 0810 09/09/19 0406 09/10/19 0448  WBC 10.3 7.7 9.4  RBC 3.67* 3.53* 3.68*  HGB 10.4* 10.2* 10.5*  HCT 30.5* 28.7* 29.7*  MCV 83.1 81.3 80.7  MCH 28.3 28.9 28.5  MCHC 34.1 35.5 35.4  RDW 14.5 14.4 14.2  PLT 206 194 213    BNP Recent Labs  Lab 09/08/19  1100  BNP 535.2*     DDimer No results for input(s): DDIMER in the last 168 hours.   Radiology    ECHOCARDIOGRAM COMPLETE  Result Date: 09/09/2019    ECHOCARDIOGRAM REPORT   Patient Name:   [name] Date of Exam: 09/09/2019 Medical Rec #:  [id number]        Height:       74.0 in Accession #:    [id number]       Weight:       282.2 lb Date of Birth:  1965-12-01        BSA:          2.516 m Patient Age:    14 years         BP:           120/74 mmHg Patient Gender: M                HR:           77 bpm. Exam Location:  Inpatient Procedure: 2D Echo, Cardiac Doppler and Color Doppler Indications:    Dyspnea 786.09  History:        Patient has prior history of Echocardiogram examinations, most                 recent 10/24/2017. CAD, Prior CABG, Signs/Symptoms:Shortness of                 Breath and Chest Pain; Risk Factors:Hypertension, Diabetes,                 Dyslipidemia and Non-Smoker. Bentall.                 Aortic Valve: mechanical valve is present in the aortic                 position. Procedure Date: 2000.  Sonographer:    Vickie Epley RDCS  Referring Phys: [id number] [place]  1. Tricuspid valve vegetation. Discussed with referring provider.  2. Left ventricular ejection fraction, by estimation, is 55 to 60%. The left ventricle has normal function. The left ventricle has no regional wall motion abnormalities. Left ventricular diastolic parameters were normal.  3. Right ventricular systolic function is normal. The right ventricular size is mildly enlarged. There is normal pulmonary artery systolic pressure.  4. Left atrial size was moderately dilated.  5. Right atrial size was severely dilated.  6. The mitral valve is normal in structure. Mild mitral valve regurgitation. No evidence of mitral stenosis.  7. There is a 1.8 x 1.1 cm tricuspid valve mass on the atrial surface consistent with vegetation. . Tricuspid valve regurgitation is moderate.  8. Transaortic velocity is higher than expected for mechanical aortic valve. The aortic valve is normal in structure. Aortic valve regurgitation is not visualized. No aortic stenosis is present. There is a mechanical valve present in the aortic position. Procedure Date: 2000. Aortic valve mean gradient measures 33.5 mmHg. Aortic valve Vmax measures 3.68 m/s.  9. Aortic root/ascending aorta has been repaired/replaced and Bentall. 10. The inferior vena cava is normal in size with greater than 50% respiratory variability, suggesting right atrial pressure of 3 mmHg. FINDINGS  Left Ventricle: Left ventricular ejection fraction, by estimation, is 55 to 60%. The left ventricle has normal function. The left ventricle has no regional wall motion abnormalities. The left ventricular internal cavity size was normal in size. There is  no left ventricular hypertrophy. Left ventricular diastolic parameters were normal. Right Ventricle:  The right ventricular size is mildly enlarged. No increase in right ventricular wall thickness. Right ventricular systolic function is normal. There is normal pulmonary artery  systolic pressure. The tricuspid regurgitant velocity is 2.51  m/s, and with an assumed right atrial pressure of 3 mmHg, the estimated right ventricular systolic pressure is [id number] mmHg. Left Atrium: Left atrial size was moderately dilated. Right Atrium: Right atrial size was severely dilated. Pericardium: There is no evidence of pericardial effusion. Mitral Valve: The mitral valve is normal in structure. Normal mobility of the mitral valve leaflets. Mild mitral annular calcification. Mild mitral valve regurgitation. No evidence of mitral valve stenosis. Tricuspid Valve: There is a 1.8 x 1.1 cm tricuspid valve mass on the atrial surface consistent with vegetation. The tricuspid valve is normal in structure. Tricuspid valve regurgitation is moderate . No evidence of tricuspid stenosis. Aortic Valve: Transaortic velocity is higher than expected for mechanical aortic valve. The aortic valve is normal in structure. Aortic valve regurgitation is not visualized. No aortic stenosis is present. Aortic valve mean gradient measures 33.5 mmHg. Aortic valve peak gradient measures 54.3 mmHg. Aortic valve area, by VTI measures 0.93 cm. There is a mechanical valve present in the aortic position. Procedure Date: 2000. Pulmonic Valve: The pulmonic valve was normal in structure. Pulmonic valve regurgitation is mild. No evidence of pulmonic stenosis. Aorta: The aortic root/ascending aorta has been repaired/replaced and Bentall. Venous: The inferior vena cava is normal in size with greater than 50% respiratory variability, suggesting right atrial pressure of 3 mmHg. IAS/Shunts: No atrial level shunt detected by color flow Doppler.  LEFT VENTRICLE PLAX 2D LVIDd:         5.60 cm      Diastology LVIDs:         4.60 cm      LV e' lateral:   13.10 cm/s LV PW:         1.10 cm      LV E/e' lateral: 5.7 LV IVS:        1.10 cm      LV e' medial:    8.49 cm/s LVOT diam:     2.20 cm      LV E/e' medial:  8.8 LV SV:         73 LV SV Index:   29  LVOT Area:     3.80 cm  LV Volumes (MOD) LV vol d, MOD A2C: 163.0 ml LV vol d, MOD A4C: 157.0 ml LV vol s, MOD A2C: 88.8 ml LV vol s, MOD A4C: 72.8 ml LV SV MOD A2C:     74.2 ml LV SV MOD A4C:     157.0 ml LV SV MOD BP:      77.9 ml RIGHT VENTRICLE RV S prime:     9.25 cm/s TAPSE (M-mode): 1.8 cm LEFT ATRIUM             Index       RIGHT ATRIUM           Index LA diam:        5.80 cm 2.31 cm/m  RA Area:     37.80 cm LA Vol (A2C):   65.8 ml 26.15 ml/m RA Volume:   141.00 ml 56.04 ml/m LA Vol (A4C):   76.5 ml 30.41 ml/m LA Biplane Vol: 77.8 ml 30.92 ml/m  AORTIC VALVE AV Area (Vmax):    0.95 cm AV Area (Vmean):   0.88 cm AV Area (VTI):     0.93 cm  AV Vmax:           368.50 cm/s AV Vmean:          273.000 cm/s AV VTI:            0.786 m AV Peak Grad:      54.3 mmHg AV Mean Grad:      33.5 mmHg LVOT Vmax:         92.00 cm/s LVOT Vmean:        63.300 cm/s LVOT VTI:          0.193 m LVOT/AV VTI ratio: 0.25  AORTA Ao Root diam: 2.90 cm MITRAL VALVE               TRICUSPID VALVE MV Area (PHT): 5.02 cm    TR Peak grad:   25.2 mmHg MV Decel Time: 151 msec    TR Vmax:        251.00 cm/s MR Peak grad: 107.3 mmHg MR Vmax:      518.00 cm/s  SHUNTS MV E velocity: 74.60 cm/s  Systemic VTI:  0.19 m MV A velocity: 37.70 cm/s  Systemic Diam: 2.20 cm MV E/A ratio:  1.98 Candee Furbish MD Electronically signed by Candee Furbish MD Signature Date/Time: 09/09/2019/12:06:06 PM    Final     Cardiac Studies   Echo 09/09/19: 1. Tricuspid valve vegetation. Discussed with referring provider.  2. Left ventricular ejection fraction, by estimation, is 55 to 60%. The  left ventricle has normal function. The left ventricle has no regional  wall motion abnormalities. Left ventricular diastolic parameters were  normal.  3. Right ventricular systolic function is normal. The right ventricular  size is mildly enlarged. There is normal pulmonary artery systolic  pressure.  4. Left atrial size was moderately dilated.  5. Right atrial  size was severely dilated.  6. The mitral valve is normal in structure. Mild mitral valve  regurgitation. No evidence of mitral stenosis.  7. There is a 1.8 x 1.1 cm tricuspid valve mass on the atrial surface  consistent with vegetation. . Tricuspid valve regurgitation is moderate.  8. Transaortic velocity is higher than expected for mechanical aortic  valve. The aortic valve is normal in structure. Aortic valve regurgitation  is not visualized. No aortic stenosis is present. There is a mechanical  valve present in the aortic  position. Procedure Date: 2000. Aortic valve mean gradient measures 33.5  mmHg. Aortic valve Vmax measures 3.68 m/s.  9. Aortic root/ascending aorta has been repaired/replaced and Bentall.  10. The inferior vena cava is normal in size with greater than 50%  respiratory variability, suggesting right atrial pressure of 3 mmHg.   Patient Profile     54 y.o. male with history of Bentall procedure in 2000 with CABG, SVG-RCA PCI with BMS in Feb 2015, he had re-do PCI in June 2019 DES to SVG graft, afib on coumadin, TIA in 2004, NIDDM, HTN, and diabetes who is being seen for dyspnea. Admitted with fever and GI symptoms.   Assessment & Plan    Infective endocarditis - tricuspid valve vegetation on TTE - scheduled for TEE today - ABX per ID - CRP elevated 228; sed rate 124   CAD s/p CABG with subsequent PCI Bental procedure with mechanical AVR - coumadin on hold - INR 3.2 - no angina - hs troponin 34 --> 28: low and flat more consistent with demand ischemia in the setting of acute illness - plavix held, started ASA   Paroxysmal atrial fibrillation -  chronic anticoagulation with coumadin - maintaining sinus rhythm - holding beta blocker for now - telemetry with sinus rhythm, PVCs   Hypertension - pressure marginal, holding home antihypertensives   For questions or updates, please contact [place] Please consult [email or web address] for contact info  under      Signed, Ledora Bottcher, PA  09/11/2019, 9:41 AM    Patient seen and examined.  Agree with above documentation.On exam, patient is alert and oriented, regular rate and rhythm, no murmurs, lungs CTAB, no LE edema or JVD.  Telemetry personally reviewed and shows sinus rhythm, PVCs.  For his infective endocarditis, TEE planned for today to evaluate tricuspid vegetation and ensure mechanical aortic valve is not infected.  Blood cultures growing Streptococcus.  ID following, on ceftriaxone and vancomycin.  Holding home antihypertensives in setting of bacteremia.  Holding warfarin to let INR drift down, with plans to start heparin drip once INR less than 2.5.  Holding Plavix as well, instead have started aspirin 81 mg daily in case surgery is needed for his valve.  Renal function improved today, suspect was dry yesterday, improving with holding p.o. Lasix.  Donato Heinz, MD

## 2019-09-11 NOTE — Op Note (Signed)
INDICATIONS: infective endocarditis  PROCEDURE:   Informed consent was obtained prior to the procedure. The risks, benefits and alternatives for the procedure were discussed and the patient comprehended these risks.  Risks include, but are not limited to, cough, sore throat, vomiting, nausea, somnolence, esophageal and stomach trauma or perforation, bleeding, low blood pressure, aspiration, pneumonia, infection, trauma to the teeth and death.    After a procedural time-out, the oropharynx was anesthetized with 20% benzocaine spray.   During this procedure the patient was administered IV propofol by Anesthesiology, Dr. Gifford Shave.  The transesophageal probe was inserted in the esophagus and stomach without difficulty and multiple views were obtained.  The patient was kept under observation until the patient left the procedure room.  The patient left the procedure room in stable condition.   Agitated microbubble saline contrast was not administered.  COMPLICATIONS:    There were no immediate complications.  FINDINGS:  Large vegetations are seen on the septal leaflet of the tricuspid valve and on the posterior disc of the mechanical aortic valve. The aortic periannular tissue is mildly heterogeneous, but there is no clear abscess. There is physiologic aortic valve backwash. There is moderate tricuspid insufficiency. Normal LVEF. The left atrium is dilated and it appears the appendage has been surgically oversown or clipped. There is no thrombus, but there is prominent spontaneous echo contrast. Mild central MR.  RECOMMENDATIONS:     CV surgical consultation.  Time Spent Directly with the Patient:  35 minutes   Bartosz Luginbill 09/11/2019, 12:57 PM

## 2019-09-11 NOTE — Anesthesia Preprocedure Evaluation (Signed)
Anesthesia Evaluation  Patient identified by MRN, date of birth, ID band Patient awake    Reviewed: Allergy & Precautions, NPO status , Patient's Chart, lab work & pertinent test results, reviewed documented beta blocker date and time   Airway Mallampati: II  TM Distance: >3 FB Neck ROM: Full    Dental  (+) Dental Advisory Given, Poor Dentition   Pulmonary neg pulmonary ROS,    Pulmonary exam normal breath sounds clear to auscultation       Cardiovascular hypertension, Pt. on home beta blockers and Pt. on medications + angina + CAD, + CABG and +CHF  + dysrhythmias Atrial Fibrillation + Valvular Problems/Murmurs (s/p AVR)  Rhythm:Regular Rate:Normal + Systolic murmurs    Neuro/Psych PSYCHIATRIC DISORDERS Anxiety Depression CVA, No Residual Symptoms    GI/Hepatic negative GI ROS, Neg liver ROS,   Endo/Other  diabetes, Type 2, Oral Hypoglycemic AgentsHypothyroidism Obesity   Renal/GU negative Renal ROS     Musculoskeletal negative musculoskeletal ROS (+)   Abdominal   Peds  Hematology  (+) Blood dyscrasia (Plavix), anemia ,   Anesthesia Other Findings Day of surgery medications reviewed with the patient.  Reproductive/Obstetrics                             Anesthesia Physical Anesthesia Plan  ASA: III  Anesthesia Plan: MAC   Post-op Pain Management:    Induction: Intravenous  PONV Risk Score and Plan: 1 and Propofol infusion and Treatment may vary due to age or medical condition  Airway Management Planned: Natural Airway and Nasal Cannula  Additional Equipment:   Intra-op Plan:   Post-operative Plan:   Informed Consent: I have reviewed the patients History and Physical, chart, labs and discussed the procedure including the risks, benefits and alternatives for the proposed anesthesia with the patient or authorized representative who has indicated his/her understanding and  acceptance.     Dental advisory given  Plan Discussed with: CRNA  Anesthesia Plan Comments:         Anesthesia Quick Evaluation

## 2019-09-11 NOTE — Transfer of Care (Signed)
Immediate Anesthesia Transfer of Care Note  Patient: [name]  Procedure(s) Performed: TRANSESOPHAGEAL ECHOCARDIOGRAM (TEE) (N/A )  Patient Location: Endoscopy Unit  Anesthesia Type:MAC  Level of Consciousness: awake, alert  and oriented  Airway & Oxygen Therapy: Patient Spontanous Breathing and Patient connected to nasal cannula oxygen  Post-op Assessment: Report given to RN, Post -op Vital signs reviewed and stable and Patient moving all extremities  Post vital signs: Reviewed and stable  Last Vitals:  Vitals Value Taken Time  BP 98/55 09/11/19 1306  Temp 36.7 C 09/11/19 1306  Pulse 88 09/11/19 1309  Resp 24 09/11/19 1309  SpO2 100 % 09/11/19 1309  Vitals shown include unvalidated device data.  Last Pain:  Vitals:   09/11/19 1306  TempSrc: Temporal  PainSc: 0-No pain      Patients Stated Pain Goal: 0 (94/[id number]/54 [id number])  Complications: No apparent anesthesia complications

## 2019-09-11 NOTE — Progress Notes (Signed)
PROGRESS NOTE    [name]  [id number] DOB: 19-Jan-1966 DOA: 09/08/2019 PCP: Biagio Borg, MD    Chief Complaint  Patient presents with  . Shortness of Breath  . Emesis   Brief Narrative:  54 y.o.malewith history of CAD s/p CABG in 2000, SVG-RCA PCI with BMS in Feb 2015, he had re-do PCI in June 2019 DES to SVG graft, ascending aortic dissection in 2000 s/p repair with AVR on warfarin.  Paroxysmal [name]. fib.  NIDDM-2, HTN and HLD presenting with 5 days of chills, nausea, vomiting, diarrhea and dyspnea on exertion and orthopnea mainly with lying on the left side.  In ED, hemodynamically stable and saturating well on room air.  Mild temp to 100.1 F. Cr 1.31 (baseline 1 to 1.1).  BUN 17.  Hgb 10.4 (baseline 11-12).  BNP 535. HS trop 34> 28.  CXR without acute finding.  INR 2.3.  COVID-19 negative.  Cardiology consulted by EDP.  Admitted for nausea, vomiting, diarrhea and DOE.  Echocardiogram ordered by cardiology.  The next day, echo with EF of 55 to 60%, severe RAE, moderate LAE, 1.8 x 1.1 cm tricuspid valve mass on the atrial surface consistent with vegetation and moderate TVR.  Blood cultures with GPC in clusters in 1/2.  BCID with Streptococcus species.  ID consulted and started on ceftriaxone and vancomycin for infective endocarditis/[name].  Repeat blood culture obtained on 4/20.   Assessment & Plan:   Principal Problem:   Suspected endocarditis mechanical aortic valve Active Problems:   Diabetes mellitus type II, non insulin dependent (HCC)   Hyperlipidemia   Essential hypertension   PAF (paroxysmal atrial fibrillation) (HCC)   Hypothyroidism   Normocytic anemia   Shortness of breath   Obesity (BMI 30-39.9)   CAD (coronary artery disease)   Nausea vomiting and diarrhea   Gram-positive cocci [name]   Endocarditis of tricuspid valve  Infective endocarditis  Zachary [name]: Patient with fever and chills.  Echocardiogram with tricuspid valve  vegetation and regurgitation.  Blood culture 4/19 with GPC in clusters and in chain.  1/2 blood cultures growing [name].  - ID consulted, appreciate recommendations - continue ceftriaxone/gentamicin - consider colonoscopy prior to surgery for ? Polyps per ID - TEE today with large vegetations on septal leaflet of the tricuspid valve and on the posterior disc of the mechanical aortic valve (see report) - CT surgery c/s, they'll see [name] 4/22 - Repeat blood culture 4/20 pending - orthopantogram with dental caries and periodontal disease 4/21  Nausea/vomiting/diarrhea: Could be due to the above.  GIP negative.  GI symptoms resolved. -Continue symptomatic care -Discontinue enteric precautions  Dyspnea on exertion/orthopnea:echo with EF of 55 to 60%, severe RAE, moderate LAE, 1.8 x 1.1 cm tricuspid valve mass on the atrial surface consistent with vegetation and moderate TVR.  No signs of fluid overload.  BNP slightly elevated but no vascular congestion on CXR.  Has history of asthma but not wheezing or coughing.  INR therapeutic to think of PE.  Overall, respiratory symptoms improved. -Hold home Lasix in the setting of AKI.  CAD s/p CABG in 2000, SVG-RCA PCI with BMS in Feb 2015, he had re-do PCI in June 2019 DES to SVG graft: Echocardiogram as above. -Continue home ASA and statin.   -Plavix on hold in the setting of #1.  Last dose on 4/19 -Metoprolol, losartan and Lasix on hold due to AKI and soft blood pressures  Elevated troponin: Likely demand ischemia in the setting of the  above.  No exertional chest pain. -Cardiac meds as above  Ascending aortic dissection in 2000 s/p repair with Mechanical AVR on warfarin.  INR therapeutic. -Plan to start IV heparin once INR less than 2.5 -Monitor fluid status and renal function.  Paroxysmal [name]. fib.  Rate controlled on metoprolol. -Metoprolol on hold per cardiology. -Anticoagulation as above.  Uncontrolled NIDDM-2 with  hyperglycemia: A1c 8.2%. -Continue SSI-moderate -Start Levemir 6 units twice daily and NovoLog 6 units AC -Continue statin.  Check lipid panel  Acute kidney injury: Likely due to hypotension and nephrotoxic meds (vancomycin, Lasix and losartan) -Holding metoprolol, Lasix and losartan -Continue monitoring  Essential HTN: Was hypotensive overnight normotensive with soft blood pressures. -metoprolol, amlodipine and losartan on hold.  HLD  -Check lipid panel -Continue atorvastatin.  Hypothyroidism: -Continue home Synthroid.  At risk for sleep apnea: STOP BANG score 6-7 which puts him at high risk for sleep apnea. -Needs outpatient sleep study.  Morbid obesity: Body mass index is 36.03 kg/m.  With comorbidities as above. -Encourage lifestyle change to lose weight.  DVT prophylaxis: heparin to start when INR decreases < 2.5 Code Status: full  Family Communication: wife at bedside Disposition:   Status is: Inpatient  Remains inpatient appropriate because:Inpatient level of care appropriate due to severity of illness   Dispo: The patient is from: Home              Anticipated d/c is to: pending              Anticipated d/c date is: > 3 days              Patient currently is not medically stable to d/c.   Consultants:   ID  Cardiology  CT surgery  Procedures:  Transesophageal Echo IMPRESSIONS    1. Left ventricular ejection fraction, by estimation, is 55 to 60%. The  left ventricle has normal function. The left ventricle has no regional  wall motion abnormalities. There is mild concentric left ventricular  hypertrophy.  2. Right ventricular systolic function is normal. The right ventricular  size is mildly enlarged. There is mildly elevated pulmonary artery  systolic pressure. The estimated right ventricular systolic pressure is  [id number] mmHg.  3. It appears that the left atrial appendage has been surgically resected  or oversown. Left atrial size was  severely dilated. No left atrial/left  atrial appendage thrombus was detected. The LAA emptying velocity was 35  cm/s.  4. Right atrial size was severely dilated.  5. The mitral valve is normal in structure. Mild mitral valve  regurgitation.  6. There is Etola Mull large, moderately mobile vegetation attached to the septal  leaflet of the tricuspid valve, measuring 27 mm in length and 11 mm in  width. It appears heterogeneous and friable.. Tricuspid valve  regurgitation is moderate.  7. There is Linnaea Ahn large vegetation attached to the medial aspect of posterior  disc or the posteromedial mechanical valve annulus. It is highly mobile  and prolapses readily across the valve orifice. It measures 15 mm in  length and 6 mm in width. Although  there is no evidence of Mounir Skipper periannular abscess cavity, the periannular  tissue echodensity is heterogeneous, raining concern for  inflammation/early abscess formation. The proximity of the aortic and the  tricuspid vegetation to the same area of the  interventricular septum also raises concern for intramyocardial abscess or  fistula formation. The aortic valve has been repaired/replaced. Aortic  valve regurgitation is trivial. Mild aortic valve stenosis. There is  Libby Goehring  unknown Medtronic tilting disk valve  present in the aortic position. Procedure Date: 2000. Aortic valve mean  gradient measures 20.0 mmHg.  8. There is mild (Grade II) atheroma plaque involving the transverse  aorta.   Comparison(s): Compared to previous reports, there is evidence of  vegetations on the tricuspid valve and the mechanical aortic valve and  suspicion for early perivalvular abscess. The dimensionless aortic vale  index is similar to older studies. This  suggests that the increased aortic valve gradients are due to increased  cardiac output (rather than obstruction from the vegetation or compromised  prosthetic disc motion).   Echo IMPRESSIONS    1. Tricuspid valve vegetation.  Discussed with referring provider.  2. Left ventricular ejection fraction, by estimation, is 55 to 60%. The  left ventricle has normal function. The left ventricle has no regional  wall motion abnormalities. Left ventricular diastolic parameters were  normal.  3. Right ventricular systolic function is normal. The right ventricular  size is mildly enlarged. There is normal pulmonary artery systolic  pressure.  4. Left atrial size was moderately dilated.  5. Right atrial size was severely dilated.  6. The mitral valve is normal in structure. Mild mitral valve  regurgitation. No evidence of mitral stenosis.  7. There is Nykiah Ma 1.8 x 1.1 cm tricuspid valve mass on the atrial surface  consistent with vegetation. . Tricuspid valve regurgitation is moderate.  8. Transaortic velocity is higher than expected for mechanical aortic  valve. The aortic valve is normal in structure. Aortic valve regurgitation  is not visualized. No aortic stenosis is present. There is Annalynne Ibanez mechanical  valve present in the aortic  position. Procedure Date: 2000. Aortic valve mean gradient measures 33.5  mmHg. Aortic valve Vmax measures 3.68 m/s.  9. Aortic root/ascending aorta has been repaired/replaced and Bentall.  10. The inferior vena cava is normal in size with greater than 50%  respiratory variability, suggesting right atrial pressure of 3 mmHg.  Antimicrobials:  Anti-infectives (From admission, onward)   Start     Dose/Rate Route Frequency Ordered Stop   09/11/19 1700  gentamicin (GARAMYCIN) 300 mg in dextrose 5 % 50 mL IVPB     300 mg 115 mL/hr over 30 Minutes Intravenous Every 24 hours 09/11/19 1540     09/10/19 2300  vancomycin (VANCOREADY) IVPB 1750 mg/350 mL  Status:  Discontinued     1,750 mg 175 mL/hr over 120 Minutes Intravenous Every 24 hours 09/10/19 0721 09/11/19 1511   09/10/19 0600  vancomycin (VANCOREADY) IVPB 1250 mg/250 mL  Status:  Discontinued     1,250 mg 166.7 mL/hr over 90 Minutes  Intravenous Every 12 hours 09/09/19 1345 09/10/19 0721   09/09/19 1430  cefTRIAXone (ROCEPHIN) 2 g in sodium chloride 0.9 % 100 mL IVPB     2 g 200 mL/hr over 30 Minutes Intravenous Every 24 hours 09/09/19 1335     09/09/19 1430  vancomycin (VANCOCIN) 2,500 mg in sodium chloride 0.9 % 500 mL IVPB     2,500 mg 250 mL/hr over 120 Minutes Intravenous  Once 09/09/19 1345 09/09/19 1847      Subjective: No new complaints  Objective: Vitals:   09/11/19 1154 09/11/19 1306 09/11/19 1316 09/11/19 1621  BP: 131/76 (!) 98/55 112/64 128/81  Pulse: 83 91 87 91  Resp: 18 13 (!) 22 20  Temp: 98.1 F (36.7 C) 98.1 F (36.7 C)  99.1 F (37.3 C)  TempSrc: Temporal Temporal  Oral  SpO2: 98% 100% 98%  99%  Weight:      Height:        Intake/Output Summary (Last 24 hours) at 09/11/2019 1945 Last data filed at 09/11/2019 1440 Gross per 24 hour  Intake 1020 ml  Output 1500 ml  Net -480 ml   Filed Weights   09/09/19 0451 09/10/19 0610 09/11/19 0458  Weight: 128 kg 127.3 kg 127 kg    Examination:  General exam: Appears calm and comfortable  Respiratory system: Clear to auscultation. Respiratory effort normal. Cardiovascular system: S1 & S2 heard, RRR. Gastrointestinal system: Abdomen is nondistended, soft and nontender. Central nervous system: Alert and oriented. No focal neurological deficits. Extremities: no LEE Skin: No rashes, lesions or ulcers Psychiatry: Judgement and insight appear normal. Mood & affect appropriate.     Data Reviewed: I have personally reviewed following labs and imaging studies  CBC: Recent Labs  Lab 09/08/19 0810 09/09/19 0406 09/10/19 0448  WBC 10.3 7.7 9.4  HGB 10.4* 10.2* 10.5*  HCT 30.5* 28.7* 29.7*  MCV 83.1 81.3 80.7  PLT 206 194 [id number]    Basic Metabolic Panel: Recent Labs  Lab 09/08/19 0810 09/08/19 1716 09/09/19 0406 09/10/19 0448 09/11/19 0508  NA 136  --  138 137 137  K 3.7  --  3.9 3.5 3.3*  CL 104  --  104 104 108  CO2 21*  --  24  23 22   GLUCOSE 153*  --  187* 181* 203*  BUN 17  --  13 16 15   CREATININE 1.31*  --  1.11 1.50* 1.27*  CALCIUM 8.7*  --  8.9 8.6* 8.4*  MG  --  1.6*  --  1.5*  --   PHOS  --   --   --  3.2  --     GFR: Estimated Creatinine Clearance: 94.1 mL/min (Garrus Gauthreaux) (by C-G formula based on SCr of 1.27 mg/dL (H)).  Liver Function Tests: Recent Labs  Lab 09/09/19 0406 09/10/19 0448  AST 23  --   ALT 15  --   ALKPHOS 80  --   BILITOT 1.1  --   PROT 7.2  --   ALBUMIN 2.5* 2.6*    CBG: Recent Labs  Lab 09/10/19 2119 09/11/19 0754 09/11/19 1112 09/11/19 1357 09/11/19 1616  GLUCAP 253* 188* 158* 163* 225*     Recent Results (from the past 240 hour(s))  SARS CORONAVIRUS 2 (TAT 6-24 HRS) Nasopharyngeal Nasopharyngeal Swab     Status: None   Collection Time: 09/08/19  1:50 PM   Specimen: Nasopharyngeal Swab  Result Value Ref Range Status   SARS Coronavirus 2 NEGATIVE NEGATIVE Final    Comment: (NOTE) SARS-CoV-2 target nucleic acids are NOT DETECTED. The SARS-CoV-2 RNA is generally detectable in upper and lower respiratory specimens during the acute phase of infection. Negative results do not preclude SARS-CoV-2 infection, do not rule out co-infections with other pathogens, and should not be used as the sole basis for treatment or other patient management decisions. Negative results must be combined with clinical observations, patient history, and epidemiological information. The expected result is Negative. Fact Sheet for Patients: SugarRoll.be Fact Sheet for Healthcare Providers: https://www.woods-mathews.com/ This test is not yet approved or cleared by the [place] FDA and  has been authorized for detection and/or diagnosis of SARS-CoV-2 by FDA under an Emergency Use Authorization (EUA). This EUA will remain  in effect (meaning this test can be used) for the duration of the COVID-19 declaration under Section 56 4(b)(1) of the Act, 21  U.S.C.  section 360bbb-3(b)(1), unless the authorization is terminated or revoked sooner. Performed at [facility], [place] [address], [place], [place] [address]   Gastrointestinal Panel by PCR , Stool     Status: None   Collection Time: 09/09/19 12:21 PM   Specimen: Stool  Result Value Ref Range Status   Campylobacter species NOT DETECTED NOT DETECTED Final   Plesimonas shigelloides NOT DETECTED NOT DETECTED Final   Salmonella species NOT DETECTED NOT DETECTED Final   Yersinia enterocolitica NOT DETECTED NOT DETECTED Final   Vibrio species NOT DETECTED NOT DETECTED Final   Vibrio cholerae NOT DETECTED NOT DETECTED Final   Enteroaggregative E coli (EAEC) NOT DETECTED NOT DETECTED Final   Enteropathogenic E coli (EPEC) NOT DETECTED NOT DETECTED Final   Enterotoxigenic E coli (ETEC) NOT DETECTED NOT DETECTED Final   Shiga like toxin producing E coli (STEC) NOT DETECTED NOT DETECTED Final   Shigella/Enteroinvasive E coli (EIEC) NOT DETECTED NOT DETECTED Final   Cryptosporidium NOT DETECTED NOT DETECTED Final   Cyclospora cayetanensis NOT DETECTED NOT DETECTED Final   Entamoeba histolytica NOT DETECTED NOT DETECTED Final   Giardia lamblia NOT DETECTED NOT DETECTED Final   Adenovirus F40/41 NOT DETECTED NOT DETECTED Final   Astrovirus NOT DETECTED NOT DETECTED Final   Norovirus GI/GII NOT DETECTED NOT DETECTED Final   Rotavirus Gershom Brobeck NOT DETECTED NOT DETECTED Final   Sapovirus (I, II, IV, and V) NOT DETECTED NOT DETECTED Final    Comment: Performed at [facility], [address] [place], [place] [address]  Culture, blood (Routine X 2) w Reflex to ID Panel     Status: Abnormal (Preliminary result)   Collection Time: 09/09/19  1:12 PM   Specimen: BLOOD  Result Value Ref Range Status   Specimen Description BLOOD RIGHT ANTECUBITAL  Final   Special Requests   Final    BOTTLES DRAWN AEROBIC AND ANAEROBIC Blood Culture adequate volume   Culture  Setup Time   Final    GRAM  POSITIVE COCCI CRITICAL RESULT CALLED TO, READ BACK BY AND VERIFIED WITH: PHARMD SEAY LAURA AT 0546  BY MESSAN HOUEGNIFIO ON 09/10/2019     Culture ([place] Torosian)  Final    STREPTOCOCCUS Zachary SUSCEPTIBILITIES TO FOLLOW Performed at [facility], [place] [address], [place], [place] [address]    Report Status PENDING  Incomplete  Culture, blood (Routine X 2) w Reflex to ID Panel     Status: None (Preliminary result)   Collection Time: 09/09/19  1:12 PM   Specimen: BLOOD LEFT HAND  Result Value Ref Range Status   Specimen Description BLOOD LEFT HAND  Final   Special Requests   Final    BOTTLES DRAWN AEROBIC AND ANAEROBIC Blood Culture adequate volume   Culture  Setup Time   Final    AEROBIC BOTTLE ONLY GRAM POSITIVE COCCI IN CHAINS GRAM POSITIVE COCCI IN CLUSTERS CRITICAL VALUE NOTED.  VALUE IS CONSISTENT WITH PREVIOUSLY REPORTED AND CALLED VALUE. Performed at [facility], [place] [address], [place], [place] [address]    Culture GRAM POSITIVE COCCI  Final   Report Status PENDING  Incomplete  Blood Culture ID Panel (Reflexed)     Status: Abnormal   Collection Time: 09/09/19  1:12 PM  Result Value Ref Range Status   Enterococcus species NOT DETECTED NOT DETECTED Final   Listeria monocytogenes NOT DETECTED NOT DETECTED Final   Staphylococcus species NOT DETECTED NOT DETECTED Final   Staphylococcus aureus (BCID) NOT DETECTED NOT DETECTED  Final   Streptococcus species DETECTED (Reily Treloar) NOT DETECTED Final    Comment: Not Enterococcus species, Streptococcus agalactiae, Streptococcus pyogenes, or Streptococcus pneumoniae. CRITICAL RESULT CALLED TO, READ BACK BY AND VERIFIED WITH: Criss Rosales PharmD 12:50 09/10/19 ([email or web address])    Streptococcus agalactiae NOT DETECTED NOT DETECTED Final   Streptococcus pneumoniae NOT DETECTED NOT DETECTED Final   Streptococcus pyogenes NOT DETECTED NOT DETECTED Final   Acinetobacter baumannii NOT DETECTED NOT DETECTED Final   Enterobacteriaceae species NOT DETECTED  NOT DETECTED Final   Enterobacter cloacae complex NOT DETECTED NOT DETECTED Final   Escherichia coli NOT DETECTED NOT DETECTED Final   Klebsiella oxytoca NOT DETECTED NOT DETECTED Final   Klebsiella pneumoniae NOT DETECTED NOT DETECTED Final   Proteus species NOT DETECTED NOT DETECTED Final   Serratia marcescens NOT DETECTED NOT DETECTED Final   Haemophilus influenzae NOT DETECTED NOT DETECTED Final   Neisseria meningitidis NOT DETECTED NOT DETECTED Final   Pseudomonas aeruginosa NOT DETECTED NOT DETECTED Final   Candida albicans NOT DETECTED NOT DETECTED Final   Candida glabrata NOT DETECTED NOT DETECTED Final   Candida krusei NOT DETECTED NOT DETECTED Final   Candida parapsilosis NOT DETECTED NOT DETECTED Final   Candida tropicalis NOT DETECTED NOT DETECTED Final    Comment: Performed at [facility], [place]. [address] [place], [place] [address]  Culture, blood (routine x 2)     Status: None (Preliminary result)   Collection Time: 09/10/19  2:46 PM   Specimen: BLOOD RIGHT FOREARM  Result Value Ref Range Status   Specimen Description BLOOD RIGHT FOREARM  Final   Special Requests   Final    BOTTLES DRAWN AEROBIC AND ANAEROBIC Blood Culture adequate volume   Culture   Final    NO GROWTH < 24 HOURS Performed at [facility], [place] [address] [place], [place] [address]    Report Status PENDING  Incomplete  Culture, blood (routine x 2)     Status: None (Preliminary result)   Collection Time: 09/10/19  2:51 PM   Specimen: BLOOD RIGHT WRIST  Result Value Ref Range Status   Specimen Description BLOOD RIGHT WRIST  Final   Special Requests   Final    BOTTLES DRAWN AEROBIC ONLY Blood Culture adequate volume   Culture   Final    NO GROWTH < 24 HOURS Performed at [facility], [place] [address], [place],  [address]    Report Status PENDING  Incomplete         Radiology Studies: DG Orthopantogram  Result Date: 09/11/2019 CLINICAL DATA:  Pre-op testing. Molar  pain. EXAM: ORTHOPANTOGRAM/PANORAMIC COMPARISON:  None. FINDINGS: Several teeth are absent. There is typical midline artifact of the Panorex technique. Significant dental caries involving the right maxillary wisdom tooth and the most posterior remaining left mandibular molar. That tooth also demonstrates significant periodontal disease. No evidence of acute fracture or dislocation. IMPRESSION: Dental caries and periodontal disease as described. Electronically Signed   By: Richardean Sale M.D.   On: 09/11/2019 18:04   ECHO TEE  Result Date: 09/11/2019    TRANSESOPHOGEAL ECHO REPORT   Patient Name:   IZYAN MAGSTADT Date of Exam: 09/11/2019 Medical Rec #:  [id number]        Height:       74.0 in Accession #:    [id number]       Weight:       280.0 lb Date of Birth:  1966/04/19  BSA:          2.508 m Patient Age:    85 years         BP:           108/65 mmHg Patient Gender: M                HR:           106 bpm. Exam Location:  Inpatient Procedure: Transesophageal Echo, Cardiac Doppler and Color Doppler Indications:     [name].  History:         Patient has prior history of Echocardiogram examinations, most                  recent 09/09/2019. CAD, Aortic Valve Disease, Endocarditis,                  Tricuspid vegetation and Prosthetic Valve Complications,                  Signs/Symptoms:Altered Mental Status; Risk Factors:Diabetes,                  Hypertension and Dyslipidemia. Aortic valve replacement.                  Bentall procedure. Aortic dissection.                  Aortic Valve: unknown Medtronic tilting disk valve is present                  in the aortic position. Procedure Date: 2000.  Sonographer:     Roseanna Rainbow RDCS Referring Phys:  [id number] RHONDA G BARRETT Diagnosing Phys: Sanda Klein MD PROCEDURE: After discussion of the risks and benefits of Rubyann Lingle TEE, an informed consent was obtained from the patient. The transesophogeal probe was passed without difficulty through the esophogus of the  patient. Imaged were obtained with the patient in Jerni Selmer left lateral decubitus position. Sedation performed by different physician. The patient was monitored while under deep sedation. Anesthestetic sedation was provided intravenously by Anesthesiology: 230mg  of Propofol. The patient's vital signs; including heart rate, blood pressure, and oxygen saturation; remained stable throughout the procedure. The patient developed no complications during the procedure. IMPRESSIONS  1. Left ventricular ejection fraction, by estimation, is 55 to 60%. The left ventricle has normal function. The left ventricle has no regional wall motion abnormalities. There is mild concentric left ventricular hypertrophy.  2. Right ventricular systolic function is normal. The right ventricular size is mildly enlarged. There is mildly elevated pulmonary artery systolic pressure. The estimated right ventricular systolic pressure is [id number] mmHg.  3. It appears that the left atrial appendage has been surgically resected or oversown. Left atrial size was severely dilated. No left atrial/left atrial appendage thrombus was detected. The LAA emptying velocity was 35 cm/s.  4. Right atrial size was severely dilated.  5. The mitral valve is normal in structure. Mild mitral valve regurgitation.  6. There is Tuwanna Krausz large, moderately mobile vegetation attached to the septal leaflet of the tricuspid valve, measuring 27 mm in length and 11 mm in width. It appears heterogeneous and friable.. Tricuspid valve regurgitation is moderate.  7. There is Lenard Kampf large vegetation attached to the medial aspect of posterior disc or the posteromedial mechanical valve annulus. It is highly mobile and prolapses readily across the valve orifice. It measures 15 mm in length and 6 mm in width. Although there is no evidence of Zeric Baranowski periannular abscess cavity, the  periannular tissue echodensity is heterogeneous, raining concern for inflammation/early abscess formation. The proximity of the aortic  and the tricuspid vegetation to the same area of the interventricular septum also raises concern for intramyocardial abscess or fistula formation. The aortic valve has been repaired/replaced. Aortic valve regurgitation is trivial. Mild aortic valve stenosis. There is Maryon Kemnitz unknown Medtronic tilting disk valve present in the aortic position. Procedure Date: 2000. Aortic valve mean gradient measures 20.0 mmHg.  8. There is mild (Grade II) atheroma plaque involving the transverse aorta. Comparison(s): Compared to previous reports, there is evidence of vegetations on the tricuspid valve and the mechanical aortic valve and suspicion for early perivalvular abscess. The dimensionless aortic vale index is similar to older studies. This suggests that the increased aortic valve gradients are due to increased cardiac output (rather than obstruction from the vegetation or compromised prosthetic disc motion). FINDINGS  Left Ventricle: Left ventricular ejection fraction, by estimation, is 55 to 60%. The left ventricle has normal function. The left ventricle has no regional wall motion abnormalities. The left ventricular internal cavity size was normal in size. There is  mild concentric left ventricular hypertrophy. Abnormal (paradoxical) septal motion consistent with post-operative status. Right Ventricle: The right ventricular size is mildly enlarged. No increase in right ventricular wall thickness. Right ventricular systolic function is normal. There is mildly elevated pulmonary artery systolic pressure. The tricuspid regurgitant velocity is 2.48 m/s, and with an assumed right atrial pressure of 8 mmHg, the estimated right ventricular systolic pressure is [id number] mmHg. Left Atrium: It appears that the left atrial appendage has been surgically resected or oversown. Left atrial size was severely dilated. Spontaneous echo contrast was present in the left atrium. No left atrial/left atrial appendage thrombus was detected. The LAA emptying  velocity was 35 cm/s. Right Atrium: Right atrial size was severely dilated. Prominent Eustachian valve. Pericardium: There is no evidence of pericardial effusion. Mitral Valve: The mitral valve is normal in structure. Mild mitral valve regurgitation, with centrally-directed jet. Tricuspid Valve: There is Nolton Denis large, moderately mobile vegetation attached to the septal leaflet of the tricuspid valve, measuring 27 mm in length and 11 mm in width. It appears heterogeneous and friable. The tricuspid valve is normal in structure. Tricuspid valve regurgitation is moderate. Aortic Valve: There is Sequoia Witz large vegetation attached to the medial aspect of posterior disc or the posteromedial mechanical valve annulus. It is highly mobile and prolapses readily across the valve orifice. It measures 15 mm in length and 6 mm in width. Although there is no evidence of Aizlyn Schifano periannular abscess cavity, the periannular tissue echodensity is heterogeneous, raining concern for inflammation/early abscess formation. The proximity of the aortic and the tricuspid vegetation to the same area of the  interventricular septum also raises concern for intramyocardial abscess or fistula formation. The aortic valve has been repaired/replaced. Aortic valve regurgitation is trivial. Mild aortic stenosis is present. Aortic valve mean gradient measures 20.0 mmHg. Aortic valve peak gradient measures 32.9 mmHg. There is Kimberlye Dilger unknown Medtronic tilting disk valve present in the aortic position. Procedure Date: 2000. Pulmonic Valve: The pulmonic valve was normal in structure. Pulmonic valve regurgitation is trivial. Aorta: The aortic root, ascending aorta, aortic arch and descending aorta are all structurally normal, with no evidence of dilitation or obstruction. There is mild (Grade II) atheroma plaque involving the transverse aorta. IAS/Shunts: No atrial level shunt detected by color flow Doppler.  AORTIC VALVE AV Vmax:           287.00 cm/s AV Vmean:  208.000  cm/s AV VTI:            0.444 m AV Peak Grad:      32.9 mmHg AV Mean Grad:      20.0 mmHg LVOT Vmax:         126.00 cm/s LVOT Vmean:        90.500 cm/s LVOT VTI:          0.223 m LVOT/AV VTI ratio: 0.50 TRICUSPID VALVE TR Peak grad:   24.6 mmHg TR Vmax:        248.00 cm/s  SHUNTS Systemic VTI: 0.22 m Dani Gobble Croitoru MD Electronically signed by Sanda Klein MD Signature Date/Time: 09/11/2019/2:35:26 PM    Final         Scheduled Meds: . aspirin EC  81 mg Oral Daily  . atorvastatin  40 mg Oral Q breakfast  . gabapentin  300 mg Oral Q breakfast  . insulin aspart  0-15 Units Subcutaneous TID WC  . insulin aspart  0-5 Units Subcutaneous QHS  . insulin aspart  6 Units Subcutaneous TID WC  . insulin detemir  6 Units Subcutaneous BID  . levothyroxine  50 mcg Oral Q0600  . [START ON 09/12/2019] metoprolol tartrate  100 mg Oral Once  . sodium chloride flush  3 mL Intravenous Once   Continuous Infusions: . cefTRIAXone (ROCEPHIN)  IV 2 g (09/11/19 1440)  . gentamicin       LOS: 3 days    Time spent: over 30 min    Fayrene Helper, MD [facility]   To contact the attending provider between 7A-7P or the covering provider during after hours 7P-7A, please log into the web site [email or web address] and access using universal [place] password for that web site. If you do not have the password, please call the hospital operator.  09/11/2019, 7:45 PM

## 2019-09-11 NOTE — Progress Notes (Signed)
  Echocardiogram Echocardiogram Transesophageal has been performed.  Bobbye Charleston 09/11/2019, 1:06 PM

## 2019-09-11 NOTE — Progress Notes (Signed)
TEE results noted and prosthetic aortic valve involved as well so will add gentamicin to the ceftriaxone, no penicillin sensitivities yet available, and monitor renal function closely. TCTS to be consulted. Strep infantarius growing (bovis-type) and may consider colonoscopy prior to any surgery depending on cardiac surgery urgency/timing (for ?  Polyps).  Repeat blood cultures sent.  Thayer Headings, MD

## 2019-09-11 NOTE — Progress Notes (Signed)
Pharmacy Antibiotic Note  [name] is a 54 y.o. 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   Pharmacy has been consulted for gentamicin dosing for synergy. SCr today is 1.27 (Baseline around 1-1.10).    Plan: Gentamicin 300 mg (~ 3 mg/kg AdjBW) every 24 hours Continue ceftriaxone 2 gm every 24 hours Monitor strep susceptibilities, management plan, renal function  Monitor gentamicin trough at steady state (Goal <1)   Height: 6\' 2"  (188 cm) Weight: 127 kg (279 lb 15.8 oz) IBW/kg (Calculated) : 82.2  Temp (24hrs), Avg:98.2 F (36.8 C), Min:98.1 F (36.7 C), Max:98.2 F (36.8 C)  Recent Labs  Lab 09/08/19 0810 09/09/19 0406 09/10/19 0448 09/11/19 0508  WBC 10.3 7.7 9.4  --   CREATININE 1.31* 1.11 1.50* 1.27*    Estimated Creatinine Clearance: 94.1 mL/min (A) (by C-G formula based on SCr of 1.27 mg/dL (H)).    Allergies  Allergen Reactions  . Diltiazem Hives  . Insulin Glargine Swelling    edema    Antimicrobials this admission: 4/19 Vanc>> 4/21 4/19 ceftriaxone >> 4/21 gentamicin>>   Microbiology results: 4/19 BCx: GPCs in clusters 2/2>>STREPTOCOCCUS INFANTARIUS  4/20: bld x2 - ngtd  Thank you for allowing pharmacy to be a part of this patient's care.  Jimmy Footman, PharmD, BCPS, BCIDP Infectious Diseases Clinical Pharmacist Phone: [phone] 09/11/2019 3:13 PM

## 2019-09-11 NOTE — Care Management Important Message (Signed)
Important Message  Patient Details  Name: [name] MRN: [id number] Date of Birth: April 22, 1966   Medicare Important Message Given:  Yes     Shelda Altes 09/11/2019, 8:28 AM

## 2019-09-11 NOTE — Anesthesia Procedure Notes (Signed)
Procedure Name: MAC Date/Time: 09/11/2019 12:42 PM Performed by: Amadeo Garnet, CRNA Pre-anesthesia Checklist: Patient identified, Suction available, Emergency Drugs available and Patient being monitored Patient Re-evaluated:Patient Re-evaluated prior to induction Oxygen Delivery Method: Nasal cannula Preoxygenation: Pre-oxygenation with 100% oxygen Induction Type: IV induction Placement Confirmation: positive ETCO2 Dental Injury: Teeth and Oropharynx as per pre-operative assessment

## 2019-09-11 NOTE — Progress Notes (Signed)
[place]amar for warfarin>>heparin Indication: atrial fibrillation + mechanical AVR  Allergies  Allergen Reactions  . Diltiazem Hives  . Insulin Glargine Swelling    edema    Patient Measurements: Height: 6\' 2"  (188 cm) Weight: 127 kg (279 lb 15.8 oz) IBW/kg (Calculated) : 82.2  Vital Signs: Temp: 98.1 F (36.7 C) (04/21 1306) Temp Source: Temporal (04/21 1306) BP: 112/64 (04/21 1316) Pulse Rate: 87 (04/21 1316)  Labs: Recent Labs    09/09/19 0406 09/10/19 0448 09/11/19 0508  HGB 10.2* 10.5*  --   HCT 28.7* 29.7*  --   PLT 194 213  --   LABPROT 30.4* 32.0* 32.6*  INR 2.9* 3.1* 3.2*  CREATININE 1.11 1.50* 1.27*    Estimated Creatinine Clearance: 94.1 mL/min (A) (by C-G formula based on SCr of 1.27 mg/dL (H)).   Medical History: Past Medical History:  Diagnosis Date  . Ascending aortic dissection ([place])    a. 2000 - with aortic valve involvement. S/p repair of aortic dissection with placement of mechanical AVR.  [name] CAD (coronary artery disease)    a. 2000 VG->PDA @ time of Ao dissection repair;  b. Cardiac CT 06/2010: no CAD, only mild plaque in prox RCA;  c. 2015 Cath: LM nl, LAD nl, LCX nl, RCA 100, VG->PDA 90 (4.5x12 Rebel BMS). d. 10/23/17 instent restenosis SVG to RCA, DES placed.  . Cholelithiasis 08/22/2013  . Chronic diastolic CHF (congestive heart failure) ([place])    a. 03/2016 Echo: EF 55-60%, no rwma, Gr2 DD.  . Diabetes mellitus   . Dyspnea   . Gross hematuria   . H/O mechanical aortic valve replacement    a. 03/2016 Echo: AoV mean gradient 1mmHg, Valve Area (VTI) 1.36 cm^2, (Vmax) 1.22 cm^2, mod dil LA.  [name] Hyperlipidemia   . Hypertension   . NEPHROLITHIASIS, HX OF   . Obesity   . PAF (paroxysmal atrial fibrillation) ([place])    a. H/o such, with recurrence of coarse afib/flutter during ER consult 03/2012.  . Stroke ([place])   . TRANSIENT ISCHEMIC ATTACK, HX OF    a. In 2004.  [name] Unspecified vitamin D deficiency     Assessment: 46 YOM presenting with SOB and N/V, on warfarin PTA for Afib and mechanical AVR. Vegetation seen on TTE, will need to hold warfarin and begin IV heparin with impending need for procedures.  INR this morning therapeutic at 3.2, CBC has been stable.  PTA dosing: 5mg  MWF, 7.5mg  all other days  Goal of Therapy:  INR 2.5-3.5 Monitor platelets by anticoagulation protocol: Yes   Plan:  -Hold warfarin -INR in the am - begin heparin once < 2.5  Erin Hearing PharmD., BCPS Clinical Pharmacist 09/11/2019 2:45 PM

## 2019-09-11 NOTE — Anesthesia Postprocedure Evaluation (Signed)
Anesthesia Post Note  Patient: [name]  Procedure(s) Performed: TRANSESOPHAGEAL ECHOCARDIOGRAM (TEE) (N/A )     Patient location during evaluation: Endoscopy Anesthesia Type: MAC Level of consciousness: awake and alert Pain management: pain level controlled Vital Signs Assessment: post-procedure vital signs reviewed and stable Respiratory status: spontaneous breathing, nonlabored ventilation and respiratory function stable Cardiovascular status: stable and blood pressure returned to baseline Postop Assessment: no apparent nausea or vomiting Anesthetic complications: no    Last Vitals:  Vitals:   09/11/19 1316 09/11/19 1621  BP: 112/64 128/81  Pulse: 87 91  Resp: (!) 22 20  Temp:  37.3 C  SpO2: 98% 99%    Last Pain:  Vitals:   09/11/19 1621  TempSrc: Oral  PainSc:                  Catalina Gravel

## 2019-09-12 ENCOUNTER — Inpatient Hospital Stay (HOSPITAL_COMMUNITY): Payer: Medicare PPO

## 2019-09-12 ENCOUNTER — Telehealth: Payer: Self-pay | Admitting: *Deleted

## 2019-09-12 DIAGNOSIS — I330 Acute and subacute infective endocarditis: Secondary | ICD-10-CM | POA: Diagnosis not present

## 2019-09-12 DIAGNOSIS — Z952 Presence of prosthetic heart valve: Secondary | ICD-10-CM | POA: Diagnosis not present

## 2019-09-12 DIAGNOSIS — I361 Nonrheumatic tricuspid (valve) insufficiency: Secondary | ICD-10-CM

## 2019-09-12 DIAGNOSIS — T826XXA Infection and inflammatory reaction due to cardiac valve prosthesis, initial encounter: Secondary | ICD-10-CM | POA: Diagnosis not present

## 2019-09-12 DIAGNOSIS — I339 Acute and subacute endocarditis, unspecified: Secondary | ICD-10-CM

## 2019-09-12 DIAGNOSIS — I480 Paroxysmal atrial fibrillation: Secondary | ICD-10-CM | POA: Diagnosis not present

## 2019-09-12 DIAGNOSIS — Z01810 Encounter for preprocedural cardiovascular examination: Secondary | ICD-10-CM | POA: Diagnosis not present

## 2019-09-12 DIAGNOSIS — I2510 Atherosclerotic heart disease of native coronary artery without angina pectoris: Secondary | ICD-10-CM

## 2019-09-12 LAB — GLUCOSE, CAPILLARY
Glucose-Capillary: 174 mg/dL — ABNORMAL HIGH (ref 70–99)
Glucose-Capillary: 271 mg/dL — ABNORMAL HIGH (ref 70–99)
Glucose-Capillary: 157 mg/dL — ABNORMAL HIGH (ref 70–99)
Glucose-Capillary: 184 mg/dL — ABNORMAL HIGH (ref 70–99)

## 2019-09-12 LAB — CBC WITH DIFFERENTIAL/PLATELET
Abs Immature Granulocytes: 0.09 10*3/uL — ABNORMAL HIGH (ref 0.00–0.07)
Basophils Absolute: 0 10*3/uL (ref 0.0–0.1)
Basophils Relative: 0 %
Eosinophils Absolute: 0.2 10*3/uL (ref 0.0–0.5)
Eosinophils Relative: 2 %
HCT: 29.7 % — ABNORMAL LOW (ref 39.0–52.0)
Hemoglobin: 10.6 g/dL — ABNORMAL LOW (ref 13.0–17.0)
Immature Granulocytes: 1 %
Lymphocytes Relative: 13 %
Lymphs Abs: 1.4 10*3/uL (ref 0.7–4.0)
MCH: 28.7 pg (ref 26.0–34.0)
MCHC: 35.7 g/dL (ref 30.0–36.0)
MCV: 80.5 fL (ref 80.0–100.0)
Monocytes Absolute: 0.9 10*3/uL (ref 0.1–1.0)
Monocytes Relative: 8 %
Neutro Abs: 8.1 10*3/uL — ABNORMAL HIGH (ref 1.7–7.7)
Neutrophils Relative %: 76 %
Platelets: 252 10*3/uL (ref 150–400)
RBC: 3.69 MIL/uL — ABNORMAL LOW (ref 4.22–5.81)
RDW: 14.2 % (ref 11.5–15.5)
WBC: 10.7 10*3/uL — ABNORMAL HIGH (ref 4.0–10.5)
nRBC: 0 % (ref 0.0–0.2)

## 2019-09-12 LAB — CULTURE, BLOOD (ROUTINE X 2)
Special Requests: ADEQUATE
Special Requests: ADEQUATE

## 2019-09-12 LAB — PROTIME-INR
INR: 3 — ABNORMAL HIGH (ref 0.8–1.2)
Prothrombin Time: 31 seconds — ABNORMAL HIGH (ref 11.4–15.2)

## 2019-09-12 LAB — BASIC METABOLIC PANEL
Anion gap: 8 (ref 5–15)
BUN: 13 mg/dL (ref 6–20)
CO2: 22 mmol/L (ref 22–32)
Calcium: 8.7 mg/dL — ABNORMAL LOW (ref 8.9–10.3)
Chloride: 107 mmol/L (ref 98–111)
Creatinine, Ser: 1.17 mg/dL (ref 0.61–1.24)
GFR calc Af Amer: >60 mL/min (ref >60)
GFR calc non Af Amer: >60 mL/min (ref >60)
Glucose, Bld: 199 mg/dL — ABNORMAL HIGH (ref 70–99)
Potassium: 3.7 mmol/L (ref 3.5–5.1)
Sodium: 137 mmol/L (ref 135–145)

## 2019-09-12 LAB — PHOSPHORUS: Phosphorus: 2.7 mg/dL (ref 2.5–4.6)

## 2019-09-12 LAB — MAGNESIUM: Magnesium: 1.7 mg/dL (ref 1.7–2.4)

## 2019-09-12 IMAGING — MR MR THORACIC SPINE WO/W CM
5 of 10 series · 17 of 48 positions shown · IV contrast (10 ML GAD)
Comparison: CTA chest/abdomen/pelvis 09/12/2019

CLINICAL DATA: Bone lesion

EXAM:
MRI THORACIC WITHOUT AND WITH CONTRAST
TECHNIQUE: Multiplanar and multiecho pulse sequences of the thoracic spine were
obtained without and with intravenous contrast.
CONTRAST:  10mL GADAVIST GADOBUTROL 1 MMOL/ML IV SOLN

[Series 6: T2 · sagittal · 3.0mm · 0.66mm/px · 2 of 17 slices shown (1 of 3)]
[im 1/17]
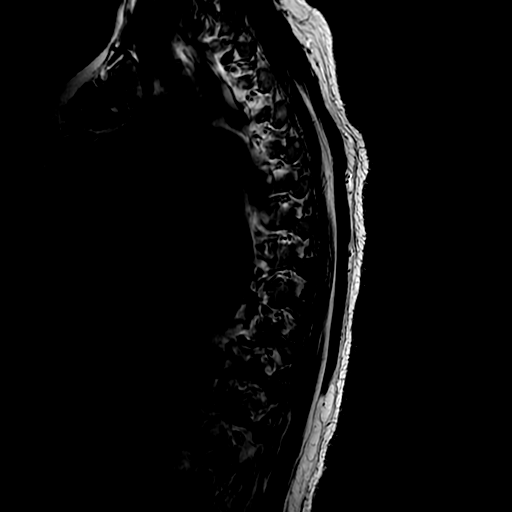
[im 17/17]
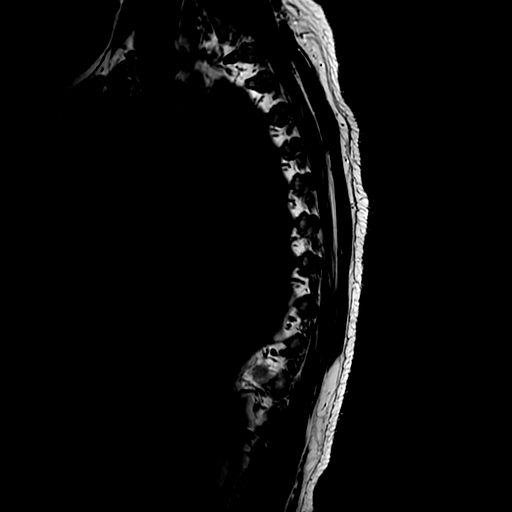

[Series 8: T1 · sagittal · 3.0mm · 0.66mm/px · 2 of 17 slices shown (1 of 2)]
[im 1/17]
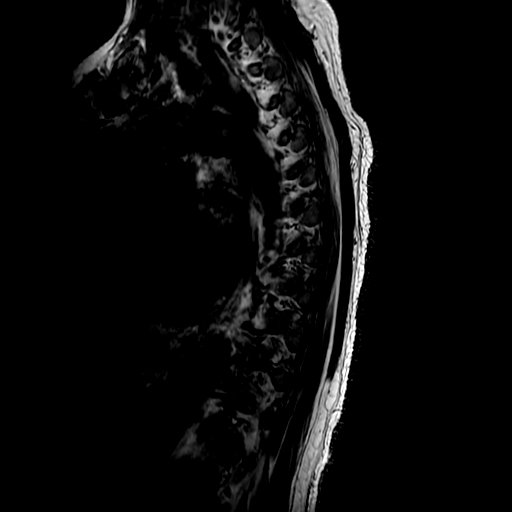
[im 17/17]
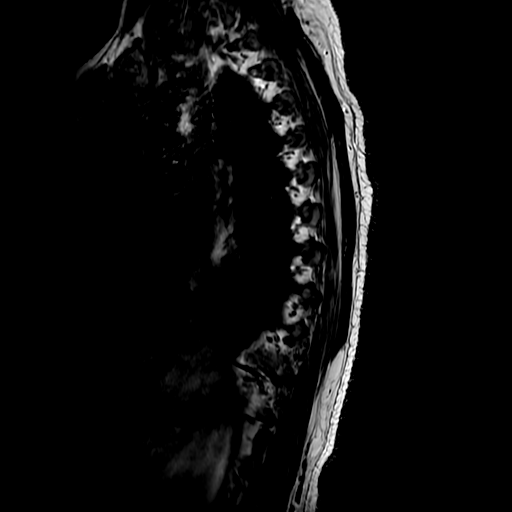

[Series 9: T2 · axial · 4.0mm · 0.39mm/px · z∈[-218,-45]mm · 5 of 34 slices shown (2 of 3)]
[im 1/34]
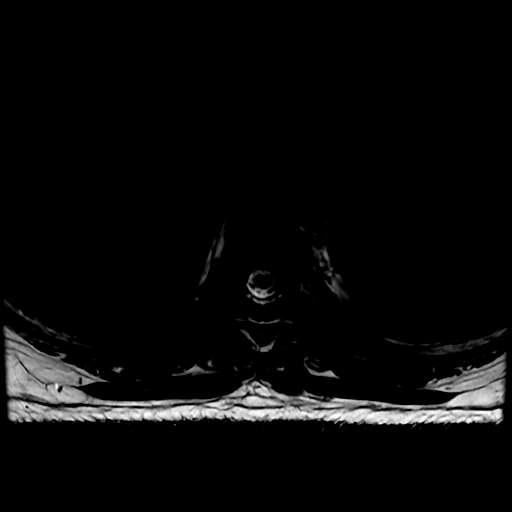
[im 9/34]
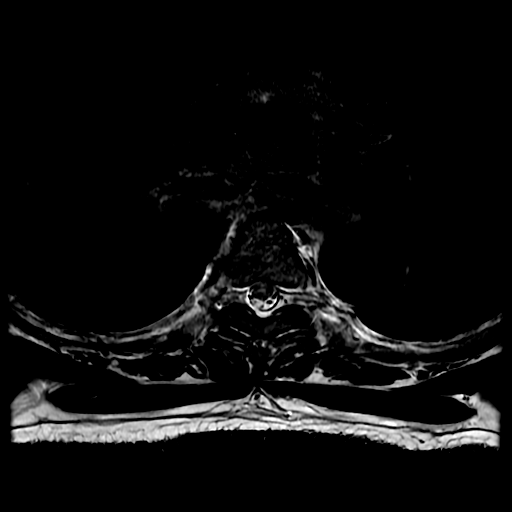
[im 17/34]
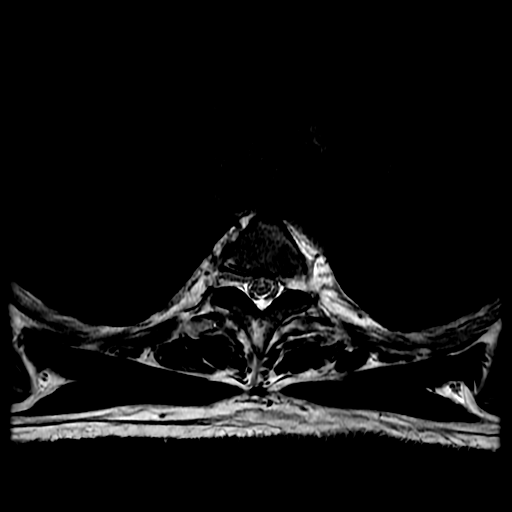
[im 25/34]
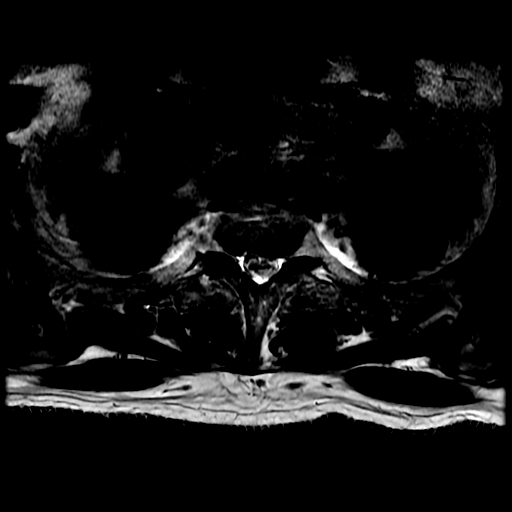
[im 34/34]
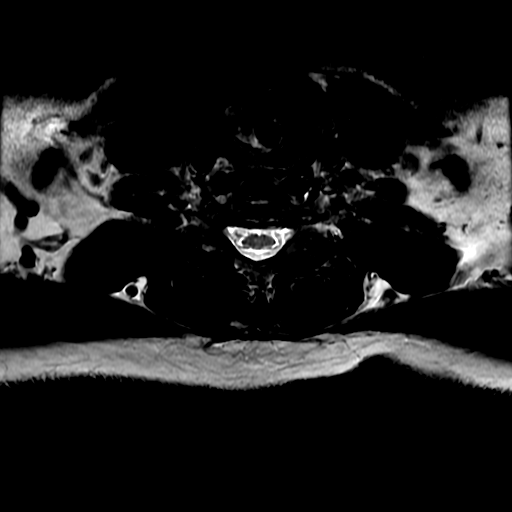

[Series 10: T2 · axial · 4.0mm · 0.39mm/px · z∈[-355,-141]mm · 5 of 40 slices shown (3 of 3)]
[im 1/40]
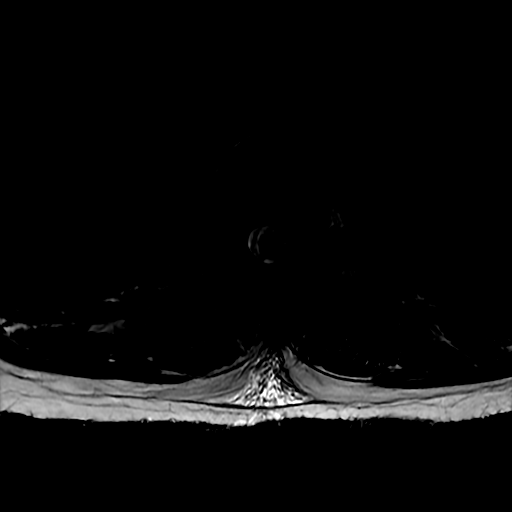
[im 10/40]
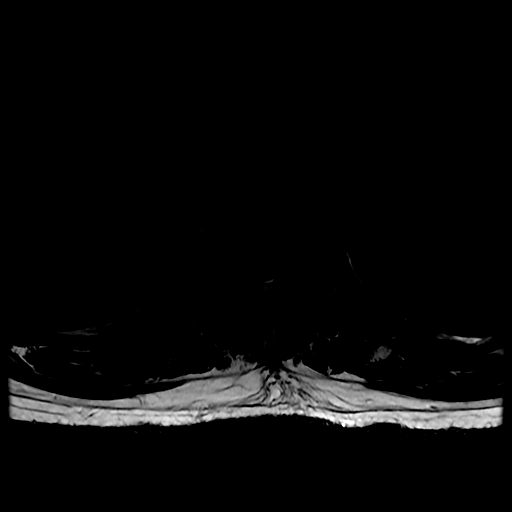
[im 20/40]
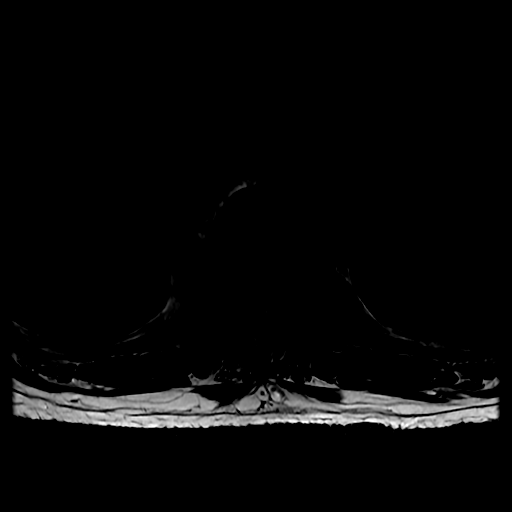
[im 30/40]
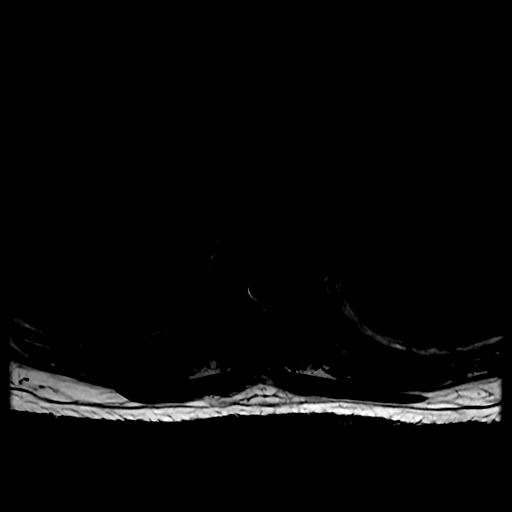
[im 40/40]
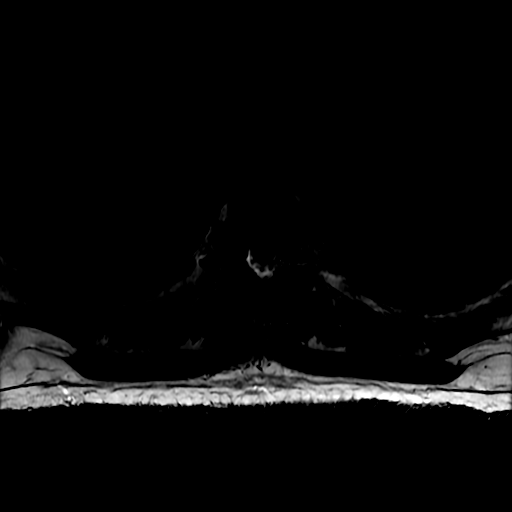

[Series 13: T1 · axial · non-contrast · 3.0mm · 0.39mm/px · z∈[-352,-250]mm · 3 of 72 slices shown (2 of 2)]
[im 1/72]
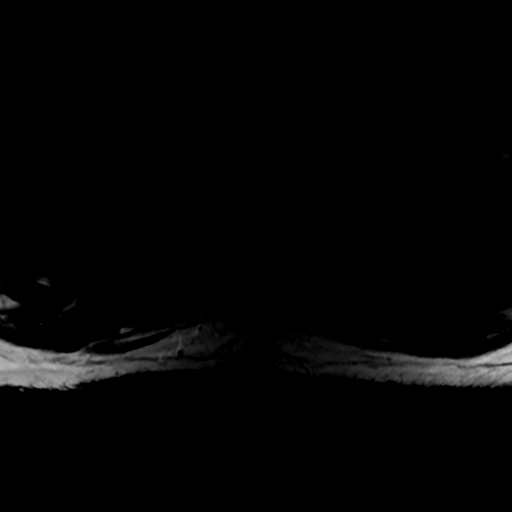
[im 8/72]
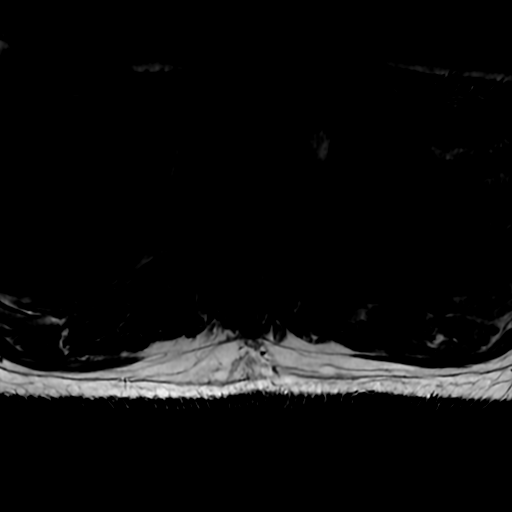
[im 24/72]
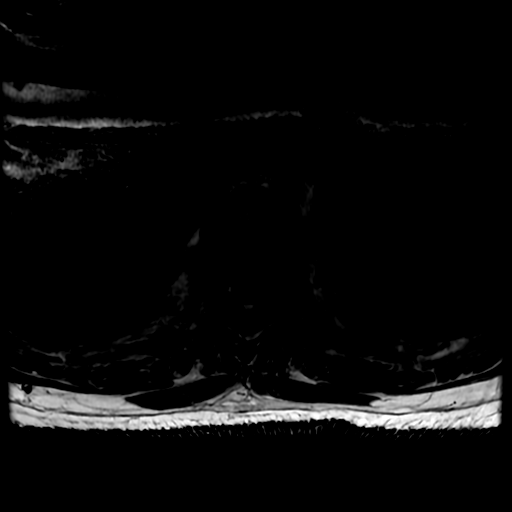

[17 of 48 positions shown; findings below may reference images not displayed]

FINDINGS: MRI THORACIC SPINE FINDINGS

The examination is intermittently motion degraded.

No scout localizer imaging is provided. Spinal numbering is
ascertained by numbering caudally from the C2 level on same-day CTA
chest/abdomen/pelvis.

Alignment: No significant spondylolisthesis.

Vertebrae: There is mild loss of T10 vertebral body height.
Vertebral body height is otherwise maintained. There is prominent
STIR hyperintensity and enhancement within the central and right
aspect of the right T10 vertebral body also extending partly into
the right T10 pedicle and transverse process. The mass extends into
the right paravertebral soft tissues, with this component measuring
2.1 x 1.7 x 2.6 cm (AP x TV x CC) (series 15, image 54) (series 7,
image 2). As demonstrated on CT performed earlier the same day,
there is involvement of the head of the right tenth rib. The T10
lesion is slightly expansile and there is a convex posterior
vertebral margin.

There are multiple additional subcentimeter foci of enhancement
within the thoracic spine which are indeterminate. These are present
within the T7, T8 and T12 vertebrae.

Cord: No spinal cord signal abnormality or abnormal cord
enhancement. No abnormal enhancement is identified within the spinal
canal.

Paraspinal and other soft tissues: Right paraspinal extension of a
T10 vertebral body mass as described above. The paraspinal soft
tissues are otherwise unremarkable.

Disc levels:

No more than mild disc degeneration at any level.

At the T10 level, bony expansion and facet hypertrophy contribute to
severe spinal canal stenosis with near complete effacement of the
thecal sac. There may be mild cord impingement. Bony expansion and
facet arthrosis also contributes to moderate/severe right neural
foraminal narrowing at T10-T11.

At the remaining thoracic levels, there is no focal disc herniation.
There is multilevel facet arthrosis and ligamentum flavum
hypertrophy which is greatest in the lower thoracic spine. No more
than mild spinal canal narrowing at these remaining levels. No
significant foraminal narrowing at the remaining levels.
IMPRESSION: Again demonstrated is an aggressive appearing mass centered within
the T10 vertebral body also extending into the right T10 pedicle and
articular pillar. There is right paraspinal extension of this mass
with this component measuring 2.6 cm and involving the head of the
right tenth rib. There is mild loss of T10 vertebral body height.
Primary differential considerations include plasmacytoma, metastatic
disease, osteoblastoma or other aggressive primary bone lesion.
Consider direct tissue sampling.

Posterior bony expansion and facet hypertrophy contribute to severe
spinal canal stenosis at the T10 level with near complete effacement
of the thecal sac. Moderate/severe right T10-T11 neural foraminal
narrowing.

There are subcentimeter foci of enhancement within the T7, T8 and
T12 vertebrae which are indeterminate.

## 2019-09-12 IMAGING — CT CT CTA ABD/PEL W/CM AND/OR W/O CM
2 of 7 series · 14 of 36 positions shown · non-contrast
Comparison: Chest CTA 10/09/2014. CTA of the chest, abdomen and
pelvis 07/24/2013.

CLINICAL DATA: 53-year-old male under preoperative evaluation prior
to potential redo Bentall procedure.

EXAM:
CT ANGIOGRAPHY CHEST, ABDOMEN AND PELVIS
TECHNIQUE: Non-contrast CT of the chest was initially obtained.

[Series 5: ax thins · axial · 0.59mm/px · z∈[-653,+41]mm · 13 of 776 slices shown]
[im 41/776  lung]
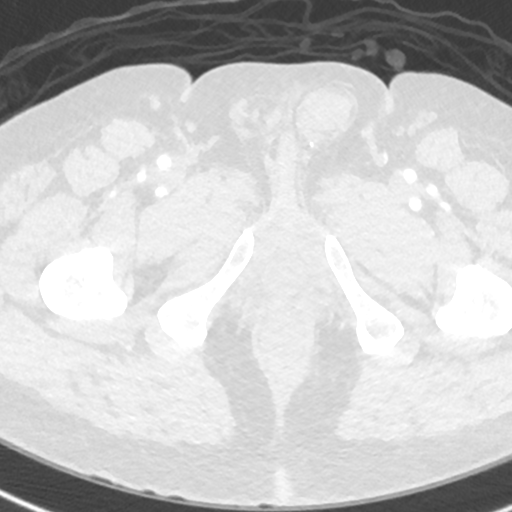
[im 123/776  mediastinal]
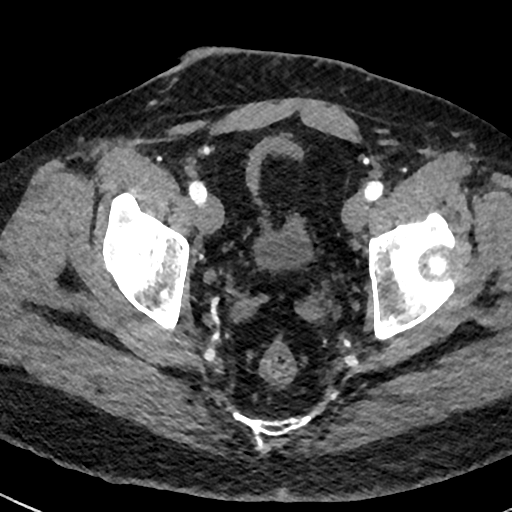
[im 164/776  lung]
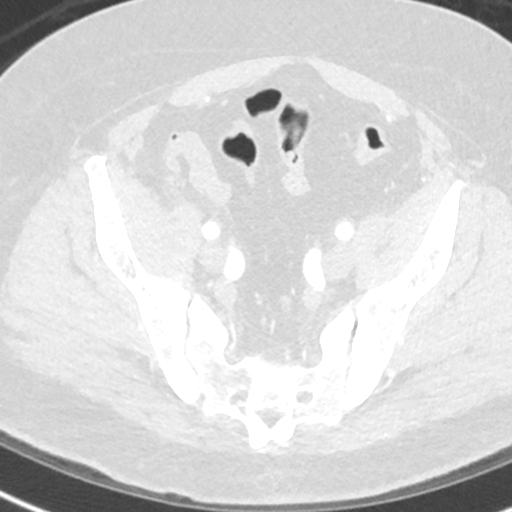
[im 204/776  mediastinal]
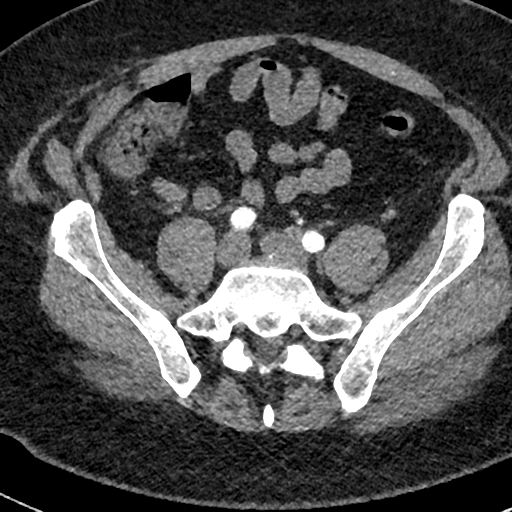
[im 286/776  lung]
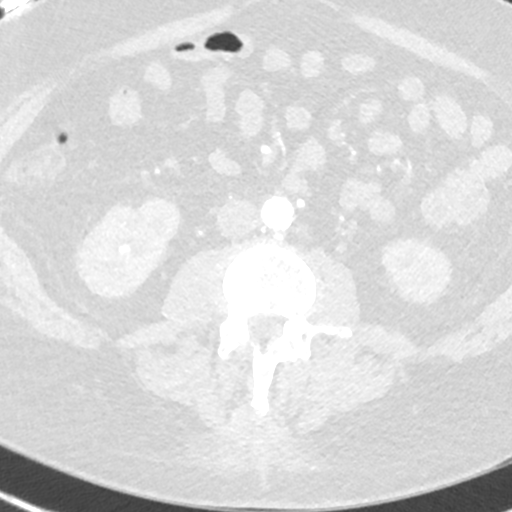
[im 327/776  mediastinal]
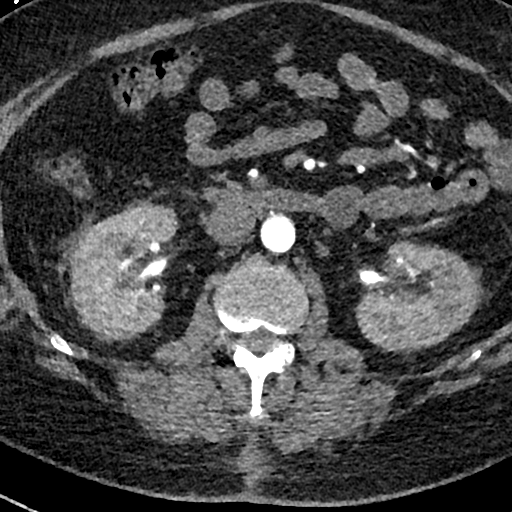
[im 408/776  lung]
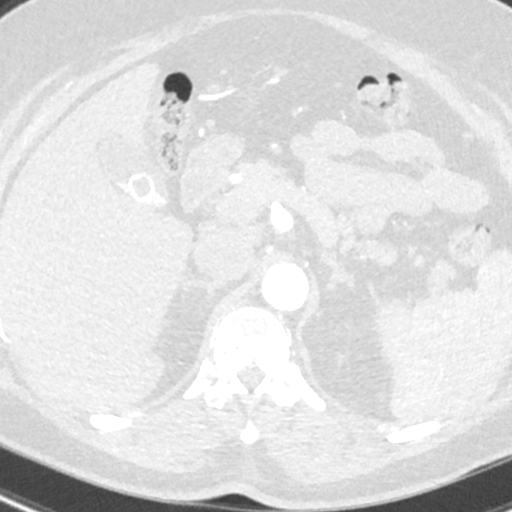
[im 449/776  mediastinal]
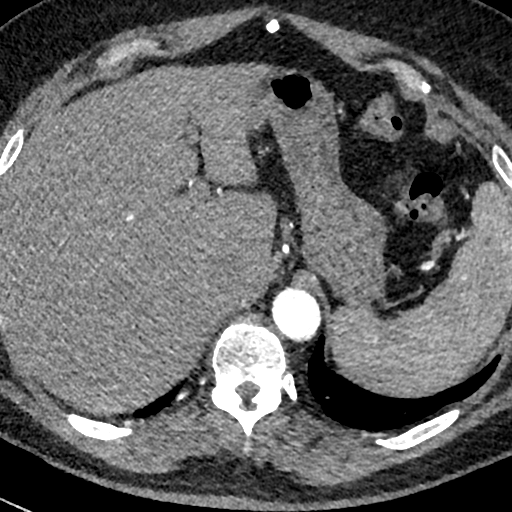
[im 490/776  lung]
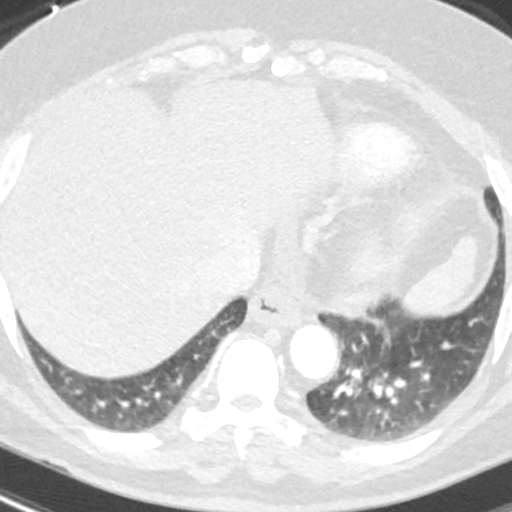
[im 572/776  mediastinal]
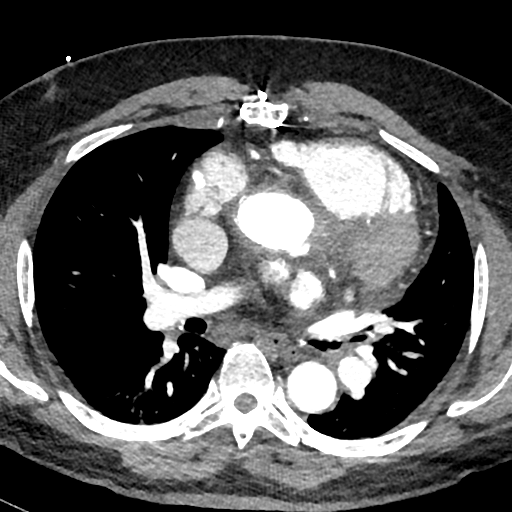
[im 612/776  lung]
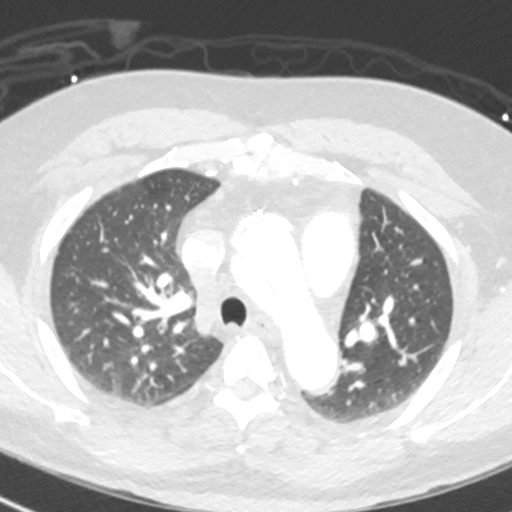
[im 653/776  mediastinal]
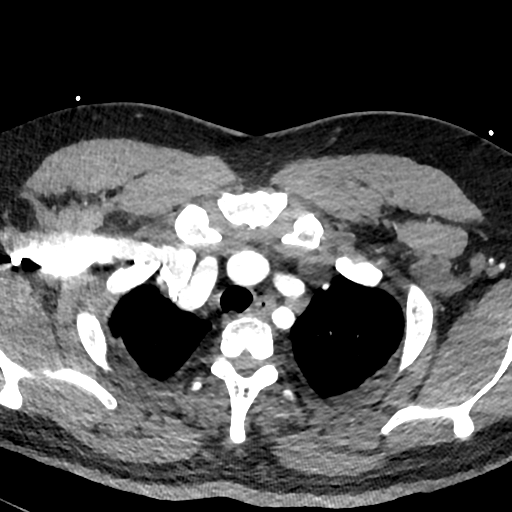
[im 735/776  lung]
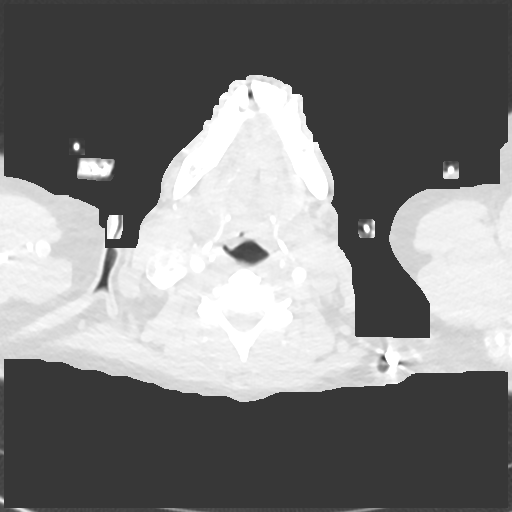

[Series 8: cor · coronal · 0.97mm/px · 1 of 167 slices shown]
[im 84/167  mediastinal]
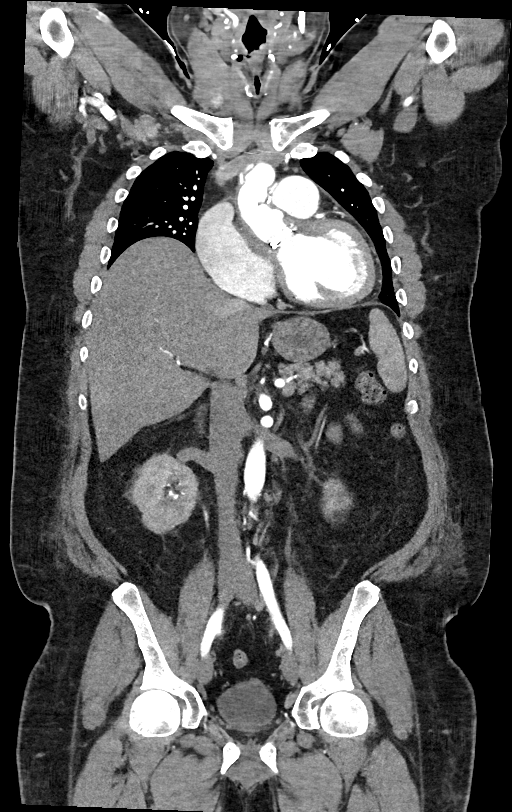

[14 of 36 positions shown; findings below may reference images not displayed]

Multidetector CT imaging through the chest, abdomen and pelvis was
performed using the standard protocol during bolus administration of
intravenous contrast. Multiplanar reconstructed images and MIPs were
obtained and reviewed to evaluate the vascular anatomy.

CONTRAST:  100mL OMNIPAQUE IOHEXOL 350 MG/ML SOLN
FINDINGS: CTA CHEST FINDINGS

Cardiovascular: Heart size is enlarged. There is no significant
pericardial fluid, thickening or pericardial calcification. Status
post median sternotomy for Bentall procedure with mechanical aortic
valve and ascending thoracic aortic graft. There is a small defect
on the undersurface of the mechanical aortic valve best appreciated
on coronal image 82 of series 8 measuring approximately 8 mm, which
may represent valve associated thrombus and/or vegetation. The
aortic arch is mildly aneurysmal measuring up to 4.3 cm in diameter.
There is a residual dissection flap in the proximal and mid aortic
arch which does not propagate into the great vessels. Descending
thoracic aorta is normal in caliber measuring 2.8 cm in diameter.
Bovine type thoracic aortic arch incidentally noted. There is a
right coronary artery bypass graft. Large filling defect in the
superior aspect of the right atrium immediately posterior to the
septal leaflet of the tricuspid valve, and intimately associated
with the adjacent aortic root, estimated to measure approximately
2.2 x 2.2 x 1.7 cm (axial image 83 of series 6 and coronal image 73
of series 8), presumably a vegetation.

Mediastinum/Nodes: No pathologically enlarged mediastinal or hilar
lymph nodes. Esophagus is unremarkable in appearance. No axillary
lymphadenopathy.

Lungs/Pleura: No suspicious pulmonary nodules or masses are noted.
No acute consolidative airspace disease. No pleural effusions.

Musculoskeletal: In the T10 vertebral body there is a large lucent
lesion with extension of soft tissue into the right paravertebral
region and involvement of the head of the right tenth rib overall
estimated to measure approximately 4.9 x 3.4 x 2.0 cm (axial image
105 of series 6 and sagittal image 117 of series 9). This causes
significant narrowing of the central spinal canal (approximately 5
mm AP) at this level. Median sternotomy wires.

Review of the MIP images confirms the above findings.

CTA ABDOMEN AND PELVIS FINDINGS

VASCULAR

Aorta: Mild aortic atherosclerosis. Normal caliber aorta without
aneurysm, dissection, vasculitis or significant stenosis.

Celiac: Patent without evidence of aneurysm, dissection, vasculitis
or significant stenosis.

SMA: Patent without evidence of aneurysm, dissection, vasculitis or
significant stenosis.

Renals: Both renal arteries are patent without evidence of aneurysm,
dissection, vasculitis, fibromuscular dysplasia or significant
stenosis.

IMA: Patent without evidence of aneurysm, dissection, vasculitis or
significant stenosis.

Inflow: Patent without evidence of aneurysm, dissection, vasculitis
or significant stenosis.

Veins: No obvious venous abnormality within the limitations of this
arterial phase study.

Review of the MIP images confirms the above findings.

NON-VASCULAR

Hepatobiliary: No suspicious appearing cystic or solid hepatic
lesions are confidently identified on today's arterial phase
examination. No intra or extrahepatic biliary ductal dilatation.
Several partially calcified gallstones are noted measuring up to
cm in diameter. Gallbladder is otherwise unremarkable in appearance.

Pancreas: No pancreatic mass. No pancreatic ductal dilatation. No
pancreatic or peripancreatic fluid collections or inflammatory
changes.

Spleen: Unremarkable.

Adrenals/Urinary Tract: Bilateral kidneys and adrenal glands are
normal in appearance. No hydroureteronephrosis. Urinary bladder is
normal in appearance.

Stomach/Bowel: Normal appearance of the stomach. No pathologic
dilatation of small bowel or colon. The appendix is not confidently
identified and may be surgically absent. Regardless, there are no
inflammatory changes noted adjacent to the cecum to suggest the
presence of an acute appendicitis at this time.

Lymphatic: No lymphadenopathy noted in the abdomen or pelvis.

Reproductive: Prostate gland and seminal vesicles are unremarkable
in appearance.

Other: No significant volume of ascites.  No pneumoperitoneum.

Musculoskeletal: There are no aggressive appearing lytic or blastic
lesions noted in the visualized portions of the skeleton.

Review of the MIP images confirms the above findings.
IMPRESSION: 1. Status post Bentall procedure with similar appearance of chronic
aneurysmal dilatation and dissection of the proximal aortic arch
which appears essentially unchanged compared to prior study from
10/09/2014.
2. Small lesions associated with the undersurface of the mechanical
aortic valve and the superior aspect of the septal leaflet of the
tricuspid valve, which may reflect valve associated thrombus and/or
vegetations, as detailed above.
3. Aggressive appearing lesion in T10 vertebral body with extends in
into the right paravertebral soft tissues and involvement of the
head of the right tenth rib, with marked narrowing of the central
spinal canal at this level, as detailed above. Primary differential
considerations include a plasmacytoma, or less likely and
osteoblastoma or solitary metastatic lesion (no primary lesion is
confidently identified). If there is clinical concern for spinal
cord compression, the full extent of this lesion could be better
evaluated with thoracic spine MRI with and without IV gadolinium.
4. Bovine type thoracic aortic arch (normal anatomical variant)
incidentally noted.
5. Cholelithiasis without evidence of acute cholecystitis at this
time.
6. Additional incidental findings, as above.

These results will be called to the ordering clinician or
representative by the Radiologist Assistant, and communication
documented in the PACS or [REDACTED].

## 2019-09-12 MED ORDER — GADOBUTROL 1 MMOL/ML IV SOLN
10.0000 mL | Freq: Once | INTRAVENOUS | Status: AC | PRN
  Administered 2019-09-12: 10 mL via INTRAVENOUS

## 2019-09-12 MED ORDER — IOHEXOL 350 MG/ML SOLN
100.0000 mL | Freq: Once | INTRAVENOUS | Status: AC | PRN
  Administered 2019-09-12: 100 mL via INTRAVENOUS

## 2019-09-12 MED ORDER — POLYETHYLENE GLYCOL 3350 17 G PO PACK
17.0000 g | PACK | Freq: Two times a day (BID) | ORAL | Status: DC
  Administered 2019-09-12 – 2019-09-14 (×4): 17 g via ORAL
  Filled 2019-09-12 (×16): qty 1

## 2019-09-12 NOTE — Progress Notes (Signed)
PROGRESS NOTE    [name]  [id number] DOB: 02-19-1966 DOA: 09/08/2019 PCP: Zachary Borg, MD    Chief Complaint  Patient presents with  . Shortness of Breath  . Emesis   Brief Narrative:  54 y.o.malewith history of CAD s/p CABG in 2000, SVG-RCA PCI with BMS in Feb 2015, he had re-do PCI in June 2019 DES to SVG graft, ascending aortic dissection in 2000 s/p repair with AVR on warfarin.  Paroxysmal Inas Zachary. fib.  NIDDM-2, HTN and HLD presenting with 5 days of chills, nausea, vomiting, diarrhea and dyspnea on exertion and orthopnea mainly with lying on the left side.  In ED, hemodynamically stable and saturating well on room air.  Mild temp to 100.1 F. Cr 1.31 (baseline 1 to 1.1).  BUN 17.  Hgb 10.4 (baseline 11-12).  BNP 535. HS trop 34> 28.  CXR without acute finding.  INR 2.3.  COVID-19 negative.  Cardiology consulted by EDP.  Admitted for nausea, vomiting, diarrhea and DOE.  Echocardiogram ordered by cardiology.  The next day, echo with EF of 55 to 60%, severe RAE, moderate LAE, 1.8 x 1.1 cm tricuspid valve mass on the atrial surface consistent with vegetation and moderate TVR.  Blood cultures with GPC in clusters in 1/2.  BCID with Streptococcus species.  ID consulted and started on ceftriaxone and vancomycin for infective endocarditis/bacteremia.  Repeat blood culture obtained on 4/20.   Assessment & Plan:   Principal Problem:   Suspected endocarditis mechanical aortic valve Active Problems:   Diabetes mellitus type II, non insulin dependent (HCC)   Hyperlipidemia   Essential hypertension   PAF (paroxysmal atrial fibrillation) (HCC)   Hypothyroidism   Normocytic anemia   Shortness of breath   Obesity (BMI 30-39.9)   CAD (coronary artery disease)   Nausea vomiting and diarrhea   Gram-positive cocci bacteremia   Endocarditis of tricuspid valve  Infective endocarditis  Strep Infantarius Bacteremia: Patient with fever and chills.  Echocardiogram with tricuspid valve  vegetation and regurgitation.   - blood cx 4/19 strep infantarius intermediate to penicillin - blood cx 4/20 cx NGTD - ID consulted, appreciate recommendations - continue ceftriaxone/gentamicin - consider colonoscopy prior to surgery for ? Polyps per ID - TEE today with large vegetations on septal leaflet of the tricuspid valve and on the posterior disc of the mechanical aortic valve (see report) - CT surgery c/s, they'll see [name] 4/22 - Repeat blood culture 4/20 pending - orthopantogram with dental caries and periodontal disease 4/21 - CT surgery c/s, planning for surgery in middle to end of next week depending on timing of any dental treatment and cardiac catheterization - CT angio chest abdomen pelvis   Aggressive appearing lesion in T10 vertebral body: plasmacytoma vs osteoblastoma vs solitary metastatic lesion.  Will obtain MRI thoracic spine.    Nausea/vomiting/diarrhea: Could be due to the above.  GIP negative.  GI symptoms resolved. -Continue symptomatic care -Discontinue enteric precautions  Dyspnea on exertion/orthopnea:echo with EF of 55 to 60%, severe RAE, moderate LAE, 1.8 x 1.1 cm tricuspid valve mass on the atrial surface consistent with vegetation and moderate TVR.  No signs of fluid overload.  BNP slightly elevated but no vascular congestion on CXR.  Has history of asthma but not wheezing or coughing.  INR therapeutic to think of PE.  Overall, respiratory symptoms improved. -Hold home Lasix in the setting of AKI.  CAD s/p CABG in 2000, SVG-RCA PCI with BMS in Feb 2015, he had re-do PCI in June  2019 DES to SVG graft: Echocardiogram as above. -Continue home ASA and statin.   -Plavix on hold in the setting of #1.  Last dose on 4/19 -Metoprolol, losartan and Lasix on hold due to AKI and soft blood pressures  Elevated troponin: Likely demand ischemia in the setting of the above.  No exertional chest pain. -Cardiac meds as above  Ascending aortic dissection in  2000 s/p repair with Mechanical AVR on warfarin.  INR therapeutic. -Plan to start IV heparin once INR less than 2.5 -Monitor fluid status and renal function.  Paroxysmal Arlene [name]. fib.  Rate controlled on metoprolol. -Metoprolol on hold per cardiology. -Anticoagulation as above.  Uncontrolled NIDDM-2 with hyperglycemia: A1c 8.2%. -Continue SSI-moderate -Start Levemir 6 units twice daily and NovoLog 6 units AC -Continue statin.  Check lipid panel  Acute kidney injury: Likely due to hypotension and nephrotoxic meds (vancomycin, Lasix and losartan) -Holding metoprolol, Lasix and losartan -Continue monitoring  Essential HTN: Was hypotensive overnight normotensive with soft blood pressures. -metoprolol, amlodipine and losartan on hold.  HLD  -Check lipid panel -Continue atorvastatin.  Hypothyroidism: -Continue home Synthroid.  At risk for sleep apnea: STOP BANG score 6-7 which puts him at high risk for sleep apnea. -Needs outpatient sleep study.  Morbid obesity: Body mass index is 36.03 kg/m.  With comorbidities as above. -Encourage lifestyle change to lose weight.  DVT prophylaxis: heparin to start when INR decreases < 2.5 Code Status: full  Family Communication: wife at bedside Disposition:   Status is: Inpatient  Remains inpatient appropriate because:Inpatient level of care appropriate due to severity of illness   Dispo: The patient is from: Home              Anticipated d/c is to: pending              Anticipated d/c date is: > 3 days              Patient currently is not medically stable to d/c.   Consultants:   ID  Cardiology  CT surgery  Procedures:  Transesophageal Echo IMPRESSIONS    1. Left ventricular ejection fraction, by estimation, is 55 to 60%. The  left ventricle has normal function. The left ventricle has no regional  wall motion abnormalities. There is mild concentric left ventricular  hypertrophy.  2. Right ventricular systolic  function is normal. The right ventricular  size is mildly enlarged. There is mildly elevated pulmonary artery  systolic pressure. The estimated right ventricular systolic pressure is  [id number] mmHg.  3. It appears that the left atrial appendage has been surgically resected  or oversown. Left atrial size was severely dilated. No left atrial/left  atrial appendage thrombus was detected. The LAA emptying velocity was 35  cm/s.  4. Right atrial size was severely dilated.  5. The mitral valve is normal in structure. Mild mitral valve  regurgitation.  6. There is Niyanna Asch large, moderately mobile vegetation attached to the septal  leaflet of the tricuspid valve, measuring 27 mm in length and 11 mm in  width. It appears heterogeneous and friable.. Tricuspid valve  regurgitation is moderate.  7. There is Livy Ross large vegetation attached to the medial aspect of posterior  disc or the posteromedial mechanical valve annulus. It is highly mobile  and prolapses readily across the valve orifice. It measures 15 mm in  length and 6 mm in width. Although  there is no evidence of Roopa Graver periannular abscess cavity, the periannular  tissue echodensity is heterogeneous, raining concern  for  inflammation/early abscess formation. The proximity of the aortic and the  tricuspid vegetation to the same area of the  interventricular septum also raises concern for intramyocardial abscess or  fistula formation. The aortic valve has been repaired/replaced. Aortic  valve regurgitation is trivial. Mild aortic valve stenosis. There is Yafet Cline  unknown [employer] tilting disk valve  present in the aortic position. Procedure Date: 2000. Aortic valve mean  gradient measures 20.0 mmHg.  8. There is mild (Grade II) atheroma plaque involving the transverse  aorta.   Comparison(s): Compared to previous reports, there is evidence of  vegetations on the tricuspid valve and the mechanical aortic valve and  suspicion for early perivalvular abscess.  The dimensionless aortic vale  index is similar to older studies. This  suggests that the increased aortic valve gradients are due to increased  cardiac output (rather than obstruction from the vegetation or compromised  prosthetic disc motion).   Echo IMPRESSIONS    1. Tricuspid valve vegetation. Discussed with referring provider.  2. Left ventricular ejection fraction, by estimation, is 55 to 60%. The  left ventricle has normal function. The left ventricle has no regional  wall motion abnormalities. Left ventricular diastolic parameters were  normal.  3. Right ventricular systolic function is normal. The right ventricular  size is mildly enlarged. There is normal pulmonary artery systolic  pressure.  4. Left atrial size was moderately dilated.  5. Right atrial size was severely dilated.  6. The mitral valve is normal in structure. Mild mitral valve  regurgitation. No evidence of mitral stenosis.  7. There is Evalyne Cortopassi 1.8 x 1.1 cm tricuspid valve mass on the atrial surface  consistent with vegetation. . Tricuspid valve regurgitation is moderate.  8. Transaortic velocity is higher than expected for mechanical aortic  valve. The aortic valve is normal in structure. Aortic valve regurgitation  is not visualized. No aortic stenosis is present. There is Ronnica Dreese mechanical  valve present in the aortic  position. Procedure Date: 2000. Aortic valve mean gradient measures 33.5  mmHg. Aortic valve Vmax measures 3.68 m/s.  9. Aortic root/ascending aorta has been repaired/replaced and Bentall.  10. The inferior vena cava is normal in size with greater than 50%  respiratory variability, suggesting right atrial pressure of 3 mmHg.  Antimicrobials:  Anti-infectives (From admission, onward)   Start     Dose/Rate Route Frequency Ordered Stop   09/11/19 1700  gentamicin (GARAMYCIN) 300 mg in dextrose 5 % 50 mL IVPB     300 mg 115 mL/hr over 30 Minutes Intravenous Every 24 hours 09/11/19 1540      09/10/19 2300  vancomycin (VANCOREADY) IVPB 1750 mg/350 mL  Status:  Discontinued     1,750 mg 175 mL/hr over 120 Minutes Intravenous Every 24 hours 09/10/19 0721 09/11/19 1511   09/10/19 0600  vancomycin (VANCOREADY) IVPB 1250 mg/250 mL  Status:  Discontinued     1,250 mg 166.7 mL/hr over 90 Minutes Intravenous Every 12 hours 09/09/19 1345 09/10/19 0721   09/09/19 1430  cefTRIAXone (ROCEPHIN) 2 g in sodium chloride 0.9 % 100 mL IVPB     2 g 200 mL/hr over 30 Minutes Intravenous Every 24 hours 09/09/19 1335     09/09/19 1430  vancomycin (VANCOCIN) 2,500 mg in sodium chloride 0.9 % 500 mL IVPB     2,500 mg 250 mL/hr over 120 Minutes Intravenous  Once 09/09/19 1345 09/09/19 1847      Subjective: No complaints today Asking how long he'll be here  Objective: Vitals:   09/12/19 0356 09/12/19 0937 09/12/19 0938 09/12/19 1503  BP: 132/87 118/75  117/72  Pulse: 84  82 75  Resp: 20   20  Temp: 97.8 F (36.6 C)   99.6 F (37.6 C)  TempSrc: Oral   Oral  SpO2: 99%   98%  Weight: 127.5 kg     Height:        Intake/Output Summary (Last 24 hours) at 09/12/2019 1705 Last data filed at 09/12/2019 1338 Gross per 24 hour  Intake 770 ml  Output 1050 ml  Net -280 ml   Filed Weights   09/10/19 0610 09/11/19 0458 09/12/19 0356  Weight: 127.3 kg 127 kg 127.5 kg    Examination:  General: No acute distress. Cardiovascular: Heart sounds show Herrick Hartog regular rate, and rhythm, systolic murmur, click Lungs: Clear to auscultation bilaterally  Abdomen: Soft, nontender, nondistended Neurological: Alert and oriented 3. Moves all extremities 4. Cranial nerves II through XII grossly intact. Skin: Warm and dry. No rashes or lesions. Extremities: No clubbing or cyanosis. No edema.   Data Reviewed: I have personally reviewed following labs and imaging studies  CBC: Recent Labs  Lab 09/08/19 0810 09/09/19 0406 09/10/19 0448 09/12/19 0535  WBC 10.3 7.7 9.4 10.7*  NEUTROABS  --   --   --  8.1*  HGB  10.4* 10.2* 10.5* 10.6*  HCT 30.5* 28.7* 29.7* 29.7*  MCV 83.1 81.3 80.7 80.5  PLT 206 194 213 [id number]    Basic Metabolic Panel: Recent Labs  Lab 09/08/19 0810 09/08/19 1716 09/09/19 0406 09/10/19 0448 09/11/19 0508 09/12/19 0535  NA 136  --  138 137 137 137  K 3.7  --  3.9 3.5 3.3* 3.7  CL 104  --  104 104 108 107  CO2 21*  --  24 23 22 22   GLUCOSE 153*  --  187* 181* 203* 199*  BUN 17  --  13 16 15 13   CREATININE 1.31*  --  1.11 1.50* 1.27* 1.17  CALCIUM 8.7*  --  8.9 8.6* 8.4* 8.7*  MG  --  1.6*  --  1.5*  --  1.7  PHOS  --   --   --  3.2  --  2.7    GFR: Estimated Creatinine Clearance: 102.4 mL/min (by C-G formula based on SCr of 1.17 mg/dL).  Liver Function Tests: Recent Labs  Lab 09/09/19 0406 09/10/19 0448  AST 23  --   ALT 15  --   ALKPHOS 80  --   BILITOT 1.1  --   PROT 7.2  --   ALBUMIN 2.5* 2.6*    CBG: Recent Labs  Lab 09/11/19 1616 09/11/19 2110 09/12/19 0737 09/12/19 1129 09/12/19 1645  GLUCAP 225* 159* 174* 271* 157*     Recent Results (from the past 240 hour(s))  SARS CORONAVIRUS 2 (TAT 6-24 HRS) Nasopharyngeal Nasopharyngeal Swab     Status: None   Collection Time: 09/08/19  1:50 PM   Specimen: Nasopharyngeal Swab  Result Value Ref Range Status   SARS Coronavirus 2 NEGATIVE NEGATIVE Final    Comment: (NOTE) SARS-CoV-2 target nucleic acids are NOT DETECTED. The SARS-CoV-2 RNA is generally detectable in upper and lower respiratory specimens during the acute phase of infection. Negative results do not preclude SARS-CoV-2 infection, do not rule out co-infections with other pathogens, and should not be used as the sole basis for treatment or other patient management decisions. Negative results must be combined with clinical observations, patient history, and epidemiological  information. The expected result is Negative. Fact Sheet for Patients: SugarRoll.be Fact Sheet for Healthcare  Providers: https://www.woods-mathews.com/ This test is not yet approved or cleared by the [place] FDA and  has been authorized for detection and/or diagnosis of SARS-CoV-2 by FDA under an Emergency Use Authorization (EUA). This EUA will remain  in effect (meaning this test can be used) for the duration of the COVID-19 declaration under Section 56 4(b)(1) of the Act, 21 U.S.C. section 360bbb-3(b)(1), unless the authorization is terminated or revoked sooner. Performed at [facility], [place] [address], [place], [place] [address]   Gastrointestinal Panel by PCR , Stool     Status: None   Collection Time: 09/09/19 12:21 PM   Specimen: Stool  Result Value Ref Range Status   Campylobacter species NOT DETECTED NOT DETECTED Final   Plesimonas shigelloides NOT DETECTED NOT DETECTED Final   Salmonella species NOT DETECTED NOT DETECTED Final   Yersinia enterocolitica NOT DETECTED NOT DETECTED Final   Vibrio species NOT DETECTED NOT DETECTED Final   Vibrio cholerae NOT DETECTED NOT DETECTED Final   Enteroaggregative E coli (EAEC) NOT DETECTED NOT DETECTED Final   Enteropathogenic E coli (EPEC) NOT DETECTED NOT DETECTED Final   Enterotoxigenic E coli (ETEC) NOT DETECTED NOT DETECTED Final   Shiga like toxin producing E coli (STEC) NOT DETECTED NOT DETECTED Final   Shigella/Enteroinvasive E coli (EIEC) NOT DETECTED NOT DETECTED Final   Cryptosporidium NOT DETECTED NOT DETECTED Final   Cyclospora cayetanensis NOT DETECTED NOT DETECTED Final   Entamoeba histolytica NOT DETECTED NOT DETECTED Final   Giardia lamblia NOT DETECTED NOT DETECTED Final   Adenovirus F40/41 NOT DETECTED NOT DETECTED Final   Astrovirus NOT DETECTED NOT DETECTED Final   Norovirus GI/GII NOT DETECTED NOT DETECTED Final   Rotavirus Huda Petrey NOT DETECTED NOT DETECTED Final   Sapovirus (I, II, IV, and V) NOT DETECTED NOT DETECTED Final    Comment: Performed at [facility], [place]  [place], [place] [address]  Culture, blood (Routine X 2) w Reflex to ID Panel     Status: Abnormal   Collection Time: 09/09/19  1:12 PM   Specimen: BLOOD  Result Value Ref Range Status   Specimen Description BLOOD RIGHT ANTECUBITAL  Final   Special Requests   Final    BOTTLES DRAWN AEROBIC AND ANAEROBIC Blood Culture adequate volume Performed at [facility], [address] [place], [place] [address]    Culture  Setup Time   Final    GRAM POSITIVE COCCI CRITICAL RESULT CALLED TO, READ BACK BY AND VERIFIED WITH: PHARMD SEAY LAURA AT 0546  BY MESSAN HOUEGNIFIO ON 09/10/2019     Culture STREPTOCOCCUS INFANTARIUS (Indica Marcott)  Final   Report Status 09/12/2019 FINAL  Final   Organism ID, Bacteria STREPTOCOCCUS INFANTARIUS  Final      Susceptibility   Streptococcus infantarius - MIC*    PENICILLIN 0.25 INTERMEDIATE Intermediate     CEFTRIAXONE 0.25 SENSITIVE Sensitive     ERYTHROMYCIN <=0.12 SENSITIVE Sensitive     LEVOFLOXACIN 2 SENSITIVE Sensitive     VANCOMYCIN <=0.12 SENSITIVE Sensitive     * STREPTOCOCCUS INFANTARIUS  Culture, blood (Routine X 2) w Reflex to ID Panel     Status: Abnormal   Collection Time: 09/09/19  1:12 PM   Specimen: BLOOD LEFT HAND  Result Value Ref Range Status   Specimen Description BLOOD LEFT HAND  Final   Special Requests   Final    BOTTLES  DRAWN AEROBIC AND ANAEROBIC Blood Culture adequate volume   Culture  Setup Time   Final    AEROBIC BOTTLE ONLY GRAM POSITIVE COCCI IN CHAINS GRAM POSITIVE COCCI IN CLUSTERS CRITICAL VALUE NOTED.  VALUE IS CONSISTENT WITH PREVIOUSLY REPORTED AND CALLED VALUE.    Culture (Dane Bloch)  Final    STREPTOCOCCUS INFANTARIUS SUSCEPTIBILITIES PERFORMED ON PREVIOUS CULTURE WITHIN THE LAST 5 DAYS. Performed at [facility], [place] [address], [place], [place] [address]    Report Status 09/12/2019 FINAL  Final  Blood Culture ID Panel (Reflexed)     Status: Abnormal   Collection Time: 09/09/19  1:12 PM  Result Value Ref Range Status    Enterococcus species NOT DETECTED NOT DETECTED Final   Listeria monocytogenes NOT DETECTED NOT DETECTED Final   Staphylococcus species NOT DETECTED NOT DETECTED Final   Staphylococcus aureus (BCID) NOT DETECTED NOT DETECTED Final   Streptococcus species DETECTED (Rowland Ericsson) NOT DETECTED Final    Comment: Not Enterococcus species, Streptococcus agalactiae, Streptococcus pyogenes, or Streptococcus pneumoniae. CRITICAL RESULT CALLED TO, READ BACK BY AND VERIFIED WITH: Criss Rosales PharmD 12:50 09/10/19 ([email or web address])    Streptococcus agalactiae NOT DETECTED NOT DETECTED Final   Streptococcus pneumoniae NOT DETECTED NOT DETECTED Final   Streptococcus pyogenes NOT DETECTED NOT DETECTED Final   Acinetobacter baumannii NOT DETECTED NOT DETECTED Final   Enterobacteriaceae species NOT DETECTED NOT DETECTED Final   Enterobacter cloacae complex NOT DETECTED NOT DETECTED Final   Escherichia coli NOT DETECTED NOT DETECTED Final   Klebsiella oxytoca NOT DETECTED NOT DETECTED Final   Klebsiella pneumoniae NOT DETECTED NOT DETECTED Final   Proteus species NOT DETECTED NOT DETECTED Final   Serratia marcescens NOT DETECTED NOT DETECTED Final   Haemophilus influenzae NOT DETECTED NOT DETECTED Final   Neisseria meningitidis NOT DETECTED NOT DETECTED Final   Pseudomonas aeruginosa NOT DETECTED NOT DETECTED Final   Candida albicans NOT DETECTED NOT DETECTED Final   Candida glabrata NOT DETECTED NOT DETECTED Final   Candida krusei NOT DETECTED NOT DETECTED Final   Candida parapsilosis NOT DETECTED NOT DETECTED Final   Candida tropicalis NOT DETECTED NOT DETECTED Final    Comment: Performed at [facility], [place]. [address], [place], [place] [address]  Culture, blood (routine x 2)     Status: None (Preliminary result)   Collection Time: 09/10/19  2:46 PM   Specimen: BLOOD RIGHT FOREARM  Result Value Ref Range Status   Specimen Description BLOOD RIGHT FOREARM  Final   Special Requests   Final    BOTTLES DRAWN AEROBIC AND  ANAEROBIC Blood Culture adequate volume   Culture   Final    NO GROWTH 2 DAYS Performed at [facility], [place] [address], [place], [place] [address]    Report Status PENDING  Incomplete  Culture, blood (routine x 2)     Status: None (Preliminary result)   Collection Time: 09/10/19  2:51 PM   Specimen: BLOOD RIGHT WRIST  Result Value Ref Range Status   Specimen Description BLOOD RIGHT WRIST  Final   Special Requests   Final    BOTTLES DRAWN AEROBIC ONLY Blood Culture adequate volume   Culture   Final    NO GROWTH 2 DAYS Performed at [facility], [place] [address], [place], [place] [address]    Report Status PENDING  Incomplete         Radiology Studies: DG Orthopantogram  Result Date: 09/11/2019 CLINICAL DATA:  Pre-op testing. Molar pain. EXAM: ORTHOPANTOGRAM/PANORAMIC  COMPARISON:  None. FINDINGS: Several teeth are absent. There is typical midline artifact of the Panorex technique. Significant dental caries involving the right maxillary wisdom tooth and the most posterior remaining left mandibular molar. That tooth also demonstrates significant periodontal disease. No evidence of acute fracture or dislocation. IMPRESSION: Dental caries and periodontal disease as described. Electronically Signed   By: Richardean Sale M.D.   On: 09/11/2019 18:04   CT ANGIO CHEST AORTA W/CM & OR WO/CM  Result Date: 09/12/2019 CLINICAL DATA:  54 year old male under preoperative evaluation prior to potential redo Bentall procedure. EXAM: CT ANGIOGRAPHY CHEST, ABDOMEN AND PELVIS TECHNIQUE: Non-contrast CT of the chest was initially obtained. Multidetector CT imaging through the chest, abdomen and pelvis was performed using the standard protocol during bolus administration of intravenous contrast. Multiplanar reconstructed images and MIPs were obtained and reviewed to evaluate the vascular anatomy. CONTRAST:  166mL OMNIPAQUE IOHEXOL 350 MG/ML SOLN COMPARISON:  Chest CTA 10/09/2014. CTA of the chest,  abdomen and pelvis 07/24/2013. FINDINGS: CTA CHEST FINDINGS Cardiovascular: Heart size is enlarged. There is no significant pericardial fluid, thickening or pericardial calcification. 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 There is Arnol Mcgibbon small defect on the undersurface of the mechanical aortic valve best appreciated on coronal image 82 of series 8 measuring approximately 8 mm, which may represent valve associated thrombus and/or vegetation. The aortic arch is mildly aneurysmal measuring up to 4.3 cm in diameter. There is Ladarrious Kirksey residual dissection flap in the proximal and mid aortic arch which does not propagate into the great vessels. Descending thoracic aorta is normal in caliber measuring 2.8 cm in diameter. Bovine type thoracic aortic arch incidentally noted. There is Braylee Lal right coronary artery bypass graft. Large filling defect in the superior aspect of the right atrium immediately posterior to the septal leaflet of the tricuspid valve, and intimately associated with the adjacent aortic root, estimated to measure approximately 2.2 x 2.2 x 1.7 cm (axial image 83 of series 6 and coronal image 73 of series 8), presumably Ahad Colarusso vegetation. Mediastinum/Nodes: No pathologically enlarged mediastinal or hilar lymph nodes. Esophagus is unremarkable in appearance. No axillary lymphadenopathy. Lungs/Pleura: No suspicious pulmonary nodules or masses are noted. No acute consolidative airspace disease. No pleural effusions. Musculoskeletal: In the T10 vertebral body there is Chelcee Korpi large lucent lesion with extension of soft tissue into the right paravertebral region and involvement of the head of the right tenth rib overall estimated to measure approximately 4.9 x 3.4 x 2.0 cm (axial image 105 of series 6 and sagittal image 117 of series 9). This causes significant narrowing of the central spinal canal (approximately 5 mm AP) at this level. Median sternotomy wires.  Review of the MIP images confirms the above findings. CTA ABDOMEN AND PELVIS FINDINGS VASCULAR Aorta: Mild aortic atherosclerosis. Normal caliber aorta without aneurysm, dissection, vasculitis or significant stenosis. Celiac: Patent without evidence of aneurysm, dissection, vasculitis or significant stenosis. SMA: Patent without evidence of aneurysm, dissection, vasculitis or significant stenosis. Renals: Both renal arteries are patent without evidence of aneurysm, dissection, vasculitis, fibromuscular dysplasia or significant stenosis. IMA: Patent without evidence of aneurysm, dissection, vasculitis or significant stenosis. Inflow: Patent without evidence of aneurysm, dissection, vasculitis or significant stenosis. Veins: No obvious venous abnormality within the limitations of this arterial phase study. Review of the MIP images confirms the above findings. NON-VASCULAR Hepatobiliary: No suspicious appearing cystic or solid hepatic lesions are confidently identified on today's arterial phase examination. No intra or extrahepatic  biliary ductal dilatation. Several partially calcified gallstones are noted measuring up to 1.6 cm in diameter. Gallbladder is otherwise unremarkable in appearance. Pancreas: No pancreatic mass. No pancreatic ductal dilatation. No pancreatic or peripancreatic fluid collections or inflammatory changes. Spleen: Unremarkable. Adrenals/Urinary Tract: Bilateral kidneys and adrenal glands are normal in appearance. No hydroureteronephrosis. Urinary bladder is normal in appearance. Stomach/Bowel: Normal appearance of the stomach. No pathologic dilatation of small bowel or colon. The appendix is not confidently identified and may be surgically absent. Regardless, there are no inflammatory changes noted adjacent to the cecum to suggest the presence of an acute appendicitis at this time. Lymphatic: No lymphadenopathy noted in the abdomen or pelvis. Reproductive: Prostate gland and seminal vesicles are  unremarkable in appearance. Other: No significant volume of ascites.  No pneumoperitoneum. Musculoskeletal: There are no aggressive appearing lytic or blastic lesions noted in the visualized portions of the skeleton. Review of the MIP images confirms the above findings. IMPRESSION: 1. 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 2. Small lesions associated with the undersurface of the mechanical aortic valve and the superior aspect of the septal leaflet of the tricuspid valve, which may reflect valve associated thrombus and/or vegetations, as detailed above. 3. Aggressive appearing lesion in T10 vertebral body with extends in into the right paravertebral soft tissues and involvement of the head of the right tenth rib, with marked narrowing of the central spinal canal at this level, as detailed above. Primary differential considerations include Carmell Elgin plasmacytoma, or less likely and osteoblastoma or solitary metastatic lesion (no primary lesion is confidently identified). If there is clinical concern for spinal cord compression, the full extent of this lesion could be better evaluated with thoracic spine MRI with and without IV gadolinium. 4. Bovine type thoracic aortic arch (normal anatomical variant) incidentally noted. 5. Cholelithiasis without evidence of acute cholecystitis at this time. 6. Additional incidental findings, as above. These results will be called to the ordering clinician or representative by the Radiologist Assistant, and communication documented in the PACS or [employer]. Electronically Signed   By: Vinnie Langton M.D.   On: 09/12/2019 13:02   ECHO TEE  Result Date: 09/11/2019    TRANSESOPHOGEAL ECHO REPORT   Patient Name:   ASSAEL LOEFFELHOLZ Date of Exam: 09/11/2019 Medical Rec #:  [id number]        Height:       74.0 in Accession  #:    [id number]       Weight:       280.0 lb Date of Birth:  [birth year]        BSA:          2.508 m Patient Age:    27 years         BP:           108/65 mmHg Patient Gender: M                HR:           106 bpm. Exam Location:  Inpatient Procedure: Transesophageal Echo, Cardiac Doppler and Color Doppler Indications:     Bacteremia.  History:         Patient has prior history of Echocardiogram examinations, most                  recent 09/09/2019. CAD, Aortic Valve Disease, Endocarditis,  Tricuspid vegetation and Prosthetic Valve Complications,                  Signs/Symptoms:Altered Mental Status; Risk Factors:Diabetes,                  Hypertension and Dyslipidemia. Aortic valve replacement.                  Bentall procedure. Aortic dissection.                  Aortic Valve: unknown Medtronic tilting disk valve is present                  in the aortic position. Procedure Date: 2000.  Sonographer:     Roseanna Rainbow RDCS Referring Phys:  [id number] RHONDA G BARRETT Diagnosing Phys: Sanda Klein MD PROCEDURE: After discussion of the risks and benefits of Emsley Custer TEE, an informed consent was obtained from the patient. The transesophogeal probe was passed without difficulty through the esophogus of the patient. Imaged were obtained with the patient in Berdine Rasmusson left lateral decubitus position. Sedation performed by different physician. The patient was monitored while under deep sedation. Anesthestetic sedation was provided intravenously by Anesthesiology: 230mg  of Propofol. The patient's vital signs; including heart rate, blood pressure, and oxygen saturation; remained stable throughout the procedure. The patient developed no complications during the procedure. IMPRESSIONS  1. Left ventricular ejection fraction, by estimation, is 55 to 60%. The left ventricle has normal function. The left ventricle has no regional wall motion abnormalities. There is mild concentric left ventricular hypertrophy.  2. Right ventricular  systolic function is normal. The right ventricular size is mildly enlarged. There is mildly elevated pulmonary artery systolic pressure. The estimated right ventricular systolic pressure is [id number] mmHg.  3. It appears that the left atrial appendage has been surgically resected or oversown. Left atrial size was severely dilated. No left atrial/left atrial appendage thrombus was detected. The LAA emptying velocity was 35 cm/s.  4. Right atrial size was severely dilated.  5. The mitral valve is normal in structure. Mild mitral valve regurgitation.  6. There is Glendon Fiser large, moderately mobile vegetation attached to the septal leaflet of the tricuspid valve, measuring 27 mm in length and 11 mm in width. It appears heterogeneous and friable.. Tricuspid valve regurgitation is moderate.  7. There is Lonald Troiani large vegetation attached to the medial aspect of posterior disc or the posteromedial mechanical valve annulus. It is highly mobile and prolapses readily across the valve orifice. It measures 15 mm in length and 6 mm in width. Although there is no evidence of Libero Puthoff periannular abscess cavity, the periannular tissue echodensity is heterogeneous, raining concern for inflammation/early abscess formation. The proximity of the aortic and the tricuspid vegetation to the same area of the interventricular septum also raises concern for intramyocardial abscess or fistula formation. The aortic valve has been repaired/replaced. Aortic valve regurgitation is trivial. Mild aortic valve stenosis. There is Derrick Orris unknown Medtronic tilting disk valve present in the aortic position. Procedure Date: 2000. Aortic valve mean gradient measures 20.0 mmHg.  8. There is mild (Grade II) atheroma plaque involving the transverse aorta. Comparison(s): Compared to previous reports, there is evidence of vegetations on the tricuspid valve and the mechanical aortic valve and suspicion for early perivalvular abscess. The dimensionless aortic vale index is similar to older  studies. This suggests that the increased aortic valve gradients are due to increased cardiac output (rather than obstruction from the vegetation or compromised prosthetic disc  motion). FINDINGS  Left Ventricle: Left ventricular ejection fraction, by estimation, is 55 to 60%. The left ventricle has normal function. The left ventricle has no regional wall motion abnormalities. The left ventricular internal cavity size was normal in size. There is  mild concentric left ventricular hypertrophy. Abnormal (paradoxical) septal motion consistent with post-operative status. Right Ventricle: The right ventricular size is mildly enlarged. No increase in right ventricular wall thickness. Right ventricular systolic function is normal. There is mildly elevated pulmonary artery systolic pressure. The tricuspid regurgitant velocity is 2.48 m/s, and with an assumed right atrial pressure of 8 mmHg, the estimated right ventricular systolic pressure is [id number] mmHg. Left Atrium: It appears that the left atrial appendage has been surgically resected or oversown. Left atrial size was severely dilated. Spontaneous echo contrast was present in the left atrium. No left atrial/left atrial appendage thrombus was detected. The LAA emptying velocity was 35 cm/s. Right Atrium: Right atrial size was severely dilated. Prominent Eustachian valve. Pericardium: There is no evidence of pericardial effusion. Mitral Valve: The mitral valve is normal in structure. Mild mitral valve regurgitation, with centrally-directed jet. Tricuspid Valve: There is Daishia Fetterly large, moderately mobile vegetation attached to the septal leaflet of the tricuspid valve, measuring 27 mm in length and 11 mm in width. It appears heterogeneous and friable. The tricuspid valve is normal in structure. Tricuspid valve regurgitation is moderate. Aortic Valve: There is Ivo Moga large vegetation attached to the medial aspect of posterior disc or the posteromedial mechanical valve annulus. It is highly  mobile and prolapses readily across the valve orifice. It measures 15 mm in length and 6 mm in width. Although there is no evidence of Iris Tatsch periannular abscess cavity, the periannular tissue echodensity is heterogeneous, raining concern for inflammation/early abscess formation. The proximity of the aortic and the tricuspid vegetation to the same area of the  interventricular septum also raises concern for intramyocardial abscess or fistula formation. The aortic valve has been repaired/replaced. Aortic valve regurgitation is trivial. Mild aortic stenosis is present. Aortic valve mean gradient measures 20.0 mmHg. Aortic valve peak gradient measures 32.9 mmHg. There is Takina Busser unknown Medtronic tilting disk valve present in the aortic position. Procedure Date: 2000. Pulmonic Valve: The pulmonic valve was normal in structure. Pulmonic valve regurgitation is trivial. Aorta: The aortic root, ascending aorta, aortic arch and descending aorta are all structurally normal, with no evidence of dilitation or obstruction. There is mild (Grade II) atheroma plaque involving the transverse aorta. IAS/Shunts: No atrial level shunt detected by color flow Doppler.  AORTIC VALVE AV Vmax:           287.00 cm/s AV Vmean:          208.000 cm/s AV VTI:            0.444 m AV Peak Grad:      32.9 mmHg AV Mean Grad:      20.0 mmHg LVOT Vmax:         126.00 cm/s LVOT Vmean:        90.500 cm/s LVOT VTI:          0.223 m LVOT/AV VTI ratio: 0.50 TRICUSPID VALVE TR Peak grad:   24.6 mmHg TR Vmax:        248.00 cm/s  SHUNTS Systemic VTI: 0.22 m Dani Gobble Croitoru MD Electronically signed by Sanda Klein MD Signature Date/Time: 09/11/2019/2:35:26 PM    Final    VAS US DOPPLER PRE CABG  Result Date: 09/12/2019 PREOPERATIVE VASCULAR EVALUATION  Indications:  Pre-CABG. Risk Factors:     Hypertension, hyperlipidemia, Diabetes. Comparison Study: No prior studies. Performing Technologist: Carlos Levering Rvt  Examination Guidelines: Renold Kozar complete evaluation  includes B-mode imaging, spectral Doppler, color Doppler, and power Doppler as needed of all accessible portions of each vessel. Bilateral testing is considered an integral part of Rohail Klees complete examination. Limited examinations for reoccurring indications may be performed as noted.  Right Carotid Findings: +----------+--------+--------+--------+-----------------------+--------+           PSV cm/sEDV cm/sStenosisDescribe               Comments +----------+--------+--------+--------+-----------------------+--------+ CCA Prox  157     16              smooth and heterogenoustortuous +----------+--------+--------+--------+-----------------------+--------+ CCA Distal46      13              smooth and heterogenous         +----------+--------+--------+--------+-----------------------+--------+ ICA Prox  50      16              smooth and heterogenous         +----------+--------+--------+--------+-----------------------+--------+ ICA Distal42      18                                     tortuous +----------+--------+--------+--------+-----------------------+--------+ ECA       74      8                                               +----------+--------+--------+--------+-----------------------+--------+ Portions of this table do not appear on this page. +----------+--------+-------+--------+------------+           PSV cm/sEDV cmsDescribeArm Pressure +----------+--------+-------+--------+------------+ Subclavian81                     134          +----------+--------+-------+--------+------------+ +---------+--------+--+--------+--+---------+ VertebralPSV cm/s33EDV cm/s10Antegrade +---------+--------+--+--------+--+---------+ Left Carotid Findings: +----------+--------+--------+--------+-----------------------+--------+           PSV cm/sEDV cm/sStenosisDescribe               Comments +----------+--------+--------+--------+-----------------------+--------+  CCA Prox  65      16              smooth and heterogenous         +----------+--------+--------+--------+-----------------------+--------+ CCA Distal62      9               smooth and heterogenous         +----------+--------+--------+--------+-----------------------+--------+ ICA Prox  40      17                                     tortuous +----------+--------+--------+--------+-----------------------+--------+ ICA Distal56      29                                     tortuous +----------+--------+--------+--------+-----------------------+--------+ ECA       45      6                                               +----------+--------+--------+--------+-----------------------+--------+ +----------+--------+--------+--------+------------+  SubclavianPSV cm/sEDV cm/sDescribeArm Pressure +----------+--------+--------+--------+------------+           71                      136          +----------+--------+--------+--------+------------+ +---------+--------+--+--------+-+---------+ VertebralPSV cm/s38EDV cm/s9Antegrade +---------+--------+--+--------+-+---------+  ABI Findings: +--------+------------------+-----+---------+--------+ Right   Rt Pressure (mmHg)IndexWaveform Comment  +--------+------------------+-----+---------+--------+ [id number]                    triphasic         +--------+------------------+-----+---------+--------+ PTA     147               1.08 triphasic         +--------+------------------+-----+---------+--------+ DP      176               1.29 triphasic         +--------+------------------+-----+---------+--------+ +--------+------------------+-----+---------+-------+ Left    Lt Pressure (mmHg)IndexWaveform Comment +--------+------------------+-----+---------+-------+ [id number]                    triphasic        +--------+------------------+-----+---------+-------+ PTA     122               0.90  triphasic        +--------+------------------+-----+---------+-------+ DP      155               1.14 triphasic        +--------+------------------+-----+---------+-------+ +-------+---------------+----------------+ ABI/TBIToday's ABI/TBIPrevious ABI/TBI +-------+---------------+----------------+ Right  1.29                            +-------+---------------+----------------+ Left   1.14                            +-------+---------------+----------------+  Right Doppler Findings: +--------+--------+-----+---------+--------+ Site    PressureIndexDoppler  Comments +--------+--------+-----+---------+--------+ [id number]          triphasic         +--------+--------+-----+---------+--------+ Radial               triphasic         +--------+--------+-----+---------+--------+ Ulnar                triphasic         +--------+--------+-----+---------+--------+  Left Doppler Findings: +--------+--------+-----+---------+--------+ Site    PressureIndexDoppler  Comments +--------+--------+-----+---------+--------+ [id number]          triphasic         +--------+--------+-----+---------+--------+ Radial               triphasic         +--------+--------+-----+---------+--------+ Ulnar                triphasic         +--------+--------+-----+---------+--------+  Summary: Right Carotid: Velocities in the right ICA are consistent with Shaan Rhoads 1-39% stenosis. Left Carotid: Velocities in the left ICA are consistent with Mckinleigh Schuchart 1-39% stenosis. Vertebrals: Bilateral vertebral arteries demonstrate antegrade flow. Right ABI: Resting right ankle-brachial index is within normal range. No evidence of significant right lower extremity arterial disease. Left ABI: Resting left ankle-brachial index is within normal range. No evidence of significant left lower extremity arterial disease. Right Upper Extremity: Doppler waveforms decrease >50% with right radial compression. Doppler  waveform obliterate with right ulnar compression. Left Upper Extremity: Doppler waveform obliterate with left radial compression. Doppler waveforms  remain within normal limits with left ulnar compression.    Preliminary    CT Angio Abd/Pel w/ and/or w/o  Result Date: 09/12/2019 CLINICAL DATA:  54 year old male under preoperative evaluation prior to potential redo Bentall procedure. EXAM: CT ANGIOGRAPHY CHEST, ABDOMEN AND PELVIS TECHNIQUE: Non-contrast CT of the chest was initially obtained. Multidetector CT imaging through the chest, abdomen and pelvis was performed using the standard protocol during bolus administration of intravenous contrast. Multiplanar reconstructed images and MIPs were obtained and reviewed to evaluate the vascular anatomy. CONTRAST:  172mL OMNIPAQUE IOHEXOL 350 MG/ML SOLN COMPARISON:  Chest CTA 10/09/2014. CTA of the chest, abdomen and pelvis 07/24/2013. FINDINGS: CTA CHEST FINDINGS Cardiovascular: Heart size is enlarged. There is no significant pericardial fluid, thickening or pericardial calcification. 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 There is Monice Lundy small defect on the undersurface of the mechanical aortic valve best appreciated on coronal image 82 of series 8 measuring approximately 8 mm, which may represent valve associated thrombus and/or vegetation. The aortic arch is mildly aneurysmal measuring up to 4.3 cm in diameter. There is Darchelle Nunes residual dissection flap in the proximal and mid aortic arch which does not propagate into the great vessels. Descending thoracic aorta is normal in caliber measuring 2.8 cm in diameter. Bovine type thoracic aortic arch incidentally noted. There is Rusty Glodowski right coronary artery bypass graft. Large filling defect in the superior aspect of the right atrium immediately posterior to the septal leaflet of the tricuspid valve, and intimately associated with the adjacent aortic root,  estimated to measure approximately 2.2 x 2.2 x 1.7 cm (axial image 83 of series 6 and coronal image 73 of series 8), presumably Jakin Pavao vegetation. Mediastinum/Nodes: No pathologically enlarged mediastinal or hilar lymph nodes. Esophagus is unremarkable in appearance. No axillary lymphadenopathy. Lungs/Pleura: No suspicious pulmonary nodules or masses are noted. No acute consolidative airspace disease. No pleural effusions. Musculoskeletal: In the T10 vertebral body there is Dewan Emond large lucent lesion with extension of soft tissue into the right paravertebral region and involvement of the head of the right tenth rib overall estimated to measure approximately 4.9 x 3.4 x 2.0 cm (axial image 105 of series 6 and sagittal image 117 of series 9). This causes significant narrowing of the central spinal canal (approximately 5 mm AP) at this level. Median sternotomy wires. Review of the MIP images confirms the above findings. CTA ABDOMEN AND PELVIS FINDINGS VASCULAR Aorta: Mild aortic atherosclerosis. Normal caliber aorta without aneurysm, dissection, vasculitis or significant stenosis. Celiac: Patent without evidence of aneurysm, dissection, vasculitis or significant stenosis. SMA: Patent without evidence of aneurysm, dissection, vasculitis or significant stenosis. Renals: Both renal arteries are patent without evidence of aneurysm, dissection, vasculitis, fibromuscular dysplasia or significant stenosis. IMA: Patent without evidence of aneurysm, dissection, vasculitis or significant stenosis. Inflow: Patent without evidence of aneurysm, dissection, vasculitis or significant stenosis. Veins: No obvious venous abnormality within the limitations of this arterial phase study. Review of the MIP images confirms the above findings. NON-VASCULAR Hepatobiliary: No suspicious appearing cystic or solid hepatic lesions are confidently identified on today's arterial phase examination. No intra or extrahepatic biliary ductal dilatation. Several  partially calcified gallstones are noted measuring up to 1.6 cm in diameter. Gallbladder is otherwise unremarkable in appearance. Pancreas: No pancreatic mass. No pancreatic ductal dilatation. No pancreatic or peripancreatic fluid collections or inflammatory changes. Spleen: Unremarkable. Adrenals/Urinary Tract: Bilateral kidneys and adrenal glands are normal in appearance. No hydroureteronephrosis. Urinary bladder is normal in  appearance. Stomach/Bowel: Normal appearance of the stomach. No pathologic dilatation of small bowel or colon. The appendix is not confidently identified and may be surgically absent. Regardless, there are no inflammatory changes noted adjacent to the cecum to suggest the presence of an acute appendicitis at this time. Lymphatic: No lymphadenopathy noted in the abdomen or pelvis. Reproductive: Prostate gland and seminal vesicles are unremarkable in appearance. Other: No significant volume of ascites.  No pneumoperitoneum. Musculoskeletal: There are no aggressive appearing lytic or blastic lesions noted in the visualized portions of the skeleton. Review of the MIP images confirms the above findings. IMPRESSION: 1. 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 2. Small lesions associated with the undersurface of the mechanical aortic valve and the superior aspect of the septal leaflet of the tricuspid valve, which may reflect valve associated thrombus and/or vegetations, as detailed above. 3. Aggressive appearing lesion in T10 vertebral body with extends in into the right paravertebral soft tissues and involvement of the head of the right tenth rib, with marked narrowing of the central spinal canal at this level, as detailed above. Primary differential considerations include Tericka Devincenzi plasmacytoma, or less likely and osteoblastoma or solitary  metastatic lesion (no primary lesion is confidently identified). If there is clinical concern for spinal cord compression, the full extent of this lesion could be better evaluated with thoracic spine MRI with and without IV gadolinium. 4. Bovine type thoracic aortic arch (normal anatomical variant) incidentally noted. 5. Cholelithiasis without evidence of acute cholecystitis at this time. 6. Additional incidental findings, as above. These results will be called to the ordering clinician or representative by the Radiologist Assistant, and communication documented in the PACS or [employer]. Electronically Signed   By: Vinnie Langton M.D.   On: 09/12/2019 13:02        Scheduled Meds: . aspirin EC  81 mg Oral Daily  . atorvastatin  40 mg Oral Q breakfast  . gabapentin  300 mg Oral Q breakfast  . insulin aspart  0-15 Units Subcutaneous TID WC  . insulin aspart  0-5 Units Subcutaneous QHS  . insulin aspart  6 Units Subcutaneous TID WC  . insulin detemir  6 Units Subcutaneous BID  . levothyroxine  50 mcg Oral Q0600  . polyethylene glycol  17 g Oral BID  . sodium chloride flush  3 mL Intravenous Once   Continuous Infusions: . cefTRIAXone (ROCEPHIN)  IV 2 g (09/12/19 1623)  . gentamicin 300 mg (09/11/19 2134)     LOS: 4 days    Time spent: over 30 min    Fayrene Helper, MD [facility]   To contact the attending provider between 7A-7P or the covering provider during after hours 7P-7A, please log into the web site [email or web address] and access using universal [place] password for that web [email or web address] If you do not have the password, please call the hospital operator.  09/12/2019, 5:05 PM

## 2019-09-12 NOTE — Consult Note (Signed)
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 vegetation to the same area of the  interventricular septum also raises concern for intramyocardial abscess or fistula formation. The aortic valve has been repaired/replaced. Aortic valve regurgitation is trivial. Mild aortic stenosis is present. Aortic valve mean gradient measures 20.0 mmHg. Aortic valve peak gradient measures 32.9 mmHg. There is a unknown Medtronic tilting disk valve present in the aortic position. Procedure Date: 2000. Pulmonic Valve: The pulmonic valve was normal in structure. Pulmonic valve regurgitation is trivial. Aorta: The aortic root, ascending aorta, aortic arch and descending aorta are all structurally normal, with no evidence of dilitation or obstruction. There is mild (Grade II) atheroma plaque involving the transverse aorta. IAS/Shunts: No atrial level shunt detected by color flow Doppler.  AORTIC VALVE AV Vmax:           287.00 cm/s AV Vmean:          208.000  cm/s AV VTI:            0.444 m AV Peak Grad:      32.9 mmHg AV Mean Grad:  20.0 mmHg LVOT Vmax:         126.00 cm/s LVOT Vmean:        90.500 cm/s LVOT VTI:          0.223 m LVOT/AV VTI ratio: 0.50 TRICUSPID VALVE TR Peak grad:   24.6 mmHg TR Vmax:        248.00 cm/s  SHUNTS Systemic VTI: 0.22 m Dani Gobble Croitoru MD Electronically signed by Sanda Klein MD Signature Date/Time: 09/11/2019/2:35:26 PM    Final    VAS US DOPPLER PRE CABG  Result Date: 09/12/2019 PREOPERATIVE VASCULAR EVALUATION  Indications:      Pre-CABG. Risk Factors:     Hypertension, hyperlipidemia, Diabetes. Comparison Study: No prior studies. Performing Technologist: Carlos Levering Rvt  Examination Guidelines: A complete evaluation includes B-mode imaging, spectral Doppler, color Doppler, and power Doppler as needed of all accessible portions of each vessel. Bilateral testing is considered an integral part of a complete examination. Limited examinations for reoccurring indications may be performed as noted.  Right Carotid Findings: +----------+--------+--------+--------+-----------------------+--------+             PSV cm/s EDV cm/s Stenosis Describe                Comments  +----------+--------+--------+--------+-----------------------+--------+  CCA Prox   157      16                smooth and heterogenous tortuous  +----------+--------+--------+--------+-----------------------+--------+  CCA Distal 46       13                smooth and heterogenous           +----------+--------+--------+--------+-----------------------+--------+  ICA Prox   50       16                smooth and heterogenous           +----------+--------+--------+--------+-----------------------+--------+  ICA Distal 42       18                                        tortuous  +----------+--------+--------+--------+-----------------------+--------+  ECA        74       8                                                    +----------+--------+--------+--------+-----------------------+--------+ Portions of this table do not appear on this page. +----------+--------+-------+--------+------------+             PSV cm/s EDV cms Describe Arm Pressure  +----------+--------+-------+--------+------------+  Subclavian 81                        134           +----------+--------+-------+--------+------------+ +---------+--------+--+--------+--+---------+  Vertebral PSV cm/s 33 EDV cm/s 10 Antegrade  +---------+--------+--+--------+--+---------+ Left Carotid Findings: +----------+--------+--------+--------+-----------------------+--------+             PSV cm/s EDV cm/s Stenosis Describe                Comments  +----------+--------+--------+--------+-----------------------+--------+  CCA Prox   65       16  smooth and heterogenous           +----------+--------+--------+--------+-----------------------+--------+  CCA Distal 62       9                 smooth and heterogenous           +----------+--------+--------+--------+-----------------------+--------+  ICA Prox   40       17                                        tortuous  +----------+--------+--------+--------+-----------------------+--------+  ICA Distal 56       29                                        tortuous  +----------+--------+--------+--------+-----------------------+--------+  ECA        45       6                                                   +----------+--------+--------+--------+-----------------------+--------+ +----------+--------+--------+--------+------------+  Subclavian PSV cm/s EDV cm/s Describe Arm Pressure  +----------+--------+--------+--------+------------+             71                         136           +----------+--------+--------+--------+------------+ +---------+--------+--+--------+-+---------+  Vertebral PSV cm/s 38 EDV cm/s 9 Antegrade  +---------+--------+--+--------+-+---------+  ABI Findings:  +--------+------------------+-----+---------+--------+  Right    Rt Pressure (mmHg) Index Waveform  Comment   +--------+------------------+-----+---------+--------+  Brachial 134                      triphasic           +--------+------------------+-----+---------+--------+  PTA      147                1.08  triphasic           +--------+------------------+-----+---------+--------+  DP       176                1.29  triphasic           +--------+------------------+-----+---------+--------+ +--------+------------------+-----+---------+-------+  Left     Lt Pressure (mmHg) Index Waveform  Comment  +--------+------------------+-----+---------+-------+  Brachial 136                      triphasic          +--------+------------------+-----+---------+-------+  PTA      122                0.90  triphasic          +--------+------------------+-----+---------+-------+  DP       155                1.14  triphasic          +--------+------------------+-----+---------+-------+ +-------+---------------+----------------+  ABI/TBI Today's ABI/TBI Previous ABI/TBI  +-------+---------------+----------------+  Right   1.29                              +-------+---------------+----------------+  Left    1.14                              +-------+---------------+----------------+  Right Doppler Findings: +--------+--------+-----+---------+--------+  Site     Pressure Index Doppler   Comments  +--------+--------+-----+---------+--------+  Brachial 134            triphasic           +--------+--------+-----+---------+--------+  Radial                  triphasic           +--------+--------+-----+---------+--------+  Ulnar                   triphasic           +--------+--------+-----+---------+--------+  Left Doppler Findings: +--------+--------+-----+---------+--------+  Site     Pressure Index Doppler   Comments  +--------+--------+-----+---------+--------+  Brachial 136            triphasic            +--------+--------+-----+---------+--------+  Radial                  triphasic           +--------+--------+-----+---------+--------+  Ulnar                   triphasic           +--------+--------+-----+---------+--------+  Summary: Right Carotid: Velocities in the right ICA are consistent with a 1-39% stenosis. Left Carotid: Velocities in the left ICA are consistent with a 1-39% stenosis. Vertebrals: Bilateral vertebral arteries demonstrate antegrade flow. Right ABI: Resting right ankle-brachial index is within normal range. No evidence of significant right lower extremity arterial disease. Left ABI: Resting left ankle-brachial index is within normal range. No evidence of significant left lower extremity arterial disease. Right Upper Extremity: Doppler waveforms decrease >50% with right radial compression. Doppler waveform obliterate with right ulnar compression. Left Upper Extremity: Doppler waveform obliterate with left radial compression. Doppler waveforms remain within normal limits with left ulnar compression.    Preliminary     Review of Systems  Constitutional: Positive for activity change, chills, fatigue, fever and unexpected weight change. Negative for appetite change.  HENT: Positive for dental problem.        Has some bad molars that he knows about but do not give him any symptoms.  Has not seen a dentist in years.  Eyes: Negative.   Respiratory: Positive for shortness of breath. Negative for chest tightness.   Cardiovascular: Negative for chest pain, palpitations and leg swelling.  Gastrointestinal: Positive for diarrhea and nausea.  Endocrine: Negative.   Genitourinary: Negative.   Musculoskeletal: Positive for back pain.       Chronic status post back surgery  Allergic/Immunologic: Negative.   Neurological: Negative for dizziness, seizures, syncope, speech difficulty and headaches.  Hematological: Negative.   Psychiatric/Behavioral: Negative.    Blood pressure 118/75, pulse 82,  temperature 97.8 F (36.6 C), temperature source Oral, resp. rate 20, height 6\' 2"  (1.88 m), weight 127.5 kg, SpO2 99 %. Physical Exam  Constitutional: He is oriented to person, place, and time.  Morbidly obese gentleman in no distress  HENT:  Head: Normocephalic and atraumatic.  Eyes: Pupils are equal, round, and reactive to light. EOM are normal.  Neck: No JVD present.  Cardiovascular: Normal rate, regular rhythm and intact distal pulses.  No murmur heard. Crisp mechanical valve click.  Respiratory: Effort normal and breath sounds normal. No respiratory distress. He has no rales.  Right subclavicular scar from prior axillary artery exposure  GI: Soft. Bowel sounds are normal. He exhibits no distension. There is no abdominal tenderness.  Musculoskeletal:        General: No edema.     Cervical back: Normal range of motion and neck supple.     Comments: Scar right upper thigh from prior saphenous vein harvest  Lymphadenopathy:    He has no cervical adenopathy.  Neurological: He is alert and oriented to person, place, and time. He has normal strength. No cranial nerve deficit or sensory deficit.  Skin: Skin is warm and dry.  Psychiatric: He has a normal mood and affect.     TRANSESOPHOGEAL ECHO REPORT       Patient Name:  [name] Date of Exam: 09/11/2019  Medical Rec #: [id number]    Height:    74.0 in  Accession #:  [id number]    Weight:    280.0 lb  Date of Birth: [birth year]    BSA:     2.508 m  Patient Age:  70 years     BP:      108/65 mmHg  Patient Gender: M        HR:      106 bpm.  Exam Location: Inpatient   Procedure: Transesophageal Echo, Cardiac Doppler and Color Doppler   Indications:   Bacteremia.    History:     Patient has prior history of Echocardiogram examinations,  most          recent 09/09/2019. CAD, Aortic Valve Disease,  Endocarditis,          Tricuspid vegetation  and Prosthetic Valve Complications,          Signs/Symptoms:Altered Mental Status; Risk  Factors:Diabetes,          Hypertension and Dyslipidemia. Aortic valve replacement.          Bentall procedure. Aortic dissection.          Aortic Valve: unknown Medtronic tilting disk valve is  present          in the aortic position. Procedure Date: 2000.    Sonographer:   Roseanna Rainbow RDCS  Referring Phys: 1993 RHONDA G BARRETT  Diagnosing Phys: Sanda Klein MD   PROCEDURE: After discussion of the risks and benefits of a TEE, an  informed consent was obtained from the patient. The transesophogeal probe  was passed without difficulty through the esophogus of the patient. Imaged  were obtained with the patient in a  left lateral decubitus position. Sedation performed by different  physician. The patient was monitored while under deep sedation.  Anesthestetic sedation was provided intravenously by Anesthesiology: 230mg   of Propofol. The patient's vital signs; including  heart rate, blood pressure, and oxygen saturation; remained stable  throughout the procedure. The patient developed no complications during  the procedure.   IMPRESSIONS    1. Left ventricular ejection fraction, by estimation, is 55 to 60%. The  left ventricle has normal function. The left ventricle has no regional  wall motion abnormalities. There is mild concentric left ventricular  hypertrophy.  2. Right ventricular systolic function is normal. The right ventricular  size is mildly enlarged. There is mildly elevated pulmonary artery  systolic pressure. The estimated right ventricular systolic pressure is  [id number] mmHg.  3. It appears that the left atrial appendage has been surgically resected  or oversown. Left atrial size was  severely dilated. No left atrial/left  atrial appendage thrombus was detected. The LAA emptying velocity was 35  cm/s.  4. Right atrial size was  severely dilated.  5. The mitral valve is normal in structure. Mild mitral valve  regurgitation.  6. There is a large, moderately mobile vegetation attached to the septal  leaflet of the tricuspid valve, measuring 27 mm in length and 11 mm in  width. It appears heterogeneous and friable.. Tricuspid valve  regurgitation is moderate.  7. There is a large vegetation attached to the medial aspect of posterior  disc or the posteromedial mechanical valve annulus. It is highly mobile  and prolapses readily across the valve orifice. It measures 15 mm in  length and 6 mm in width. Although  there is no evidence of a periannular abscess cavity, the periannular  tissue echodensity is heterogeneous, raining concern for  inflammation/early abscess formation. The proximity of the aortic and the  tricuspid vegetation to the same area of the  interventricular septum also raises concern for intramyocardial abscess or  fistula formation. The aortic valve has been repaired/replaced. Aortic  valve regurgitation is trivial. Mild aortic valve stenosis. There is a  unknown Medtronic tilting disk valve  present in the aortic position. Procedure Date: 2000. Aortic valve mean  gradient measures 20.0 mmHg.  8. There is mild (Grade II) atheroma plaque involving the transverse  aorta.   Comparison(s): Compared to previous reports, there is evidence of  vegetations on the tricuspid valve and the mechanical aortic valve and  suspicion for early perivalvular abscess. The dimensionless aortic vale  index is similar to older studies. This  suggests that the increased aortic valve gradients are due to increased  cardiac output (rather than obstruction from the vegetation or compromised  prosthetic disc motion).   FINDINGS  Left Ventricle: Left ventricular ejection fraction, by estimation, is 55  to 60%. The left ventricle has normal function. The left ventricle has no  regional wall motion abnormalities. The  left ventricular internal cavity  size was normal in size. There is  mild concentric left ventricular hypertrophy. Abnormal (paradoxical)  septal motion consistent with post-operative status.   Right Ventricle: The right ventricular size is mildly enlarged. No  increase in right ventricular wall thickness. Right ventricular systolic  function is normal. There is mildly elevated pulmonary artery systolic  pressure. The tricuspid regurgitant  velocity is 2.48 m/s, and with an assumed right atrial pressure of 8 mmHg,  the estimated right ventricular systolic pressure is [id number] mmHg.   Left Atrium: It appears that the left atrial appendage has been surgically  resected or oversown. Left atrial size was severely dilated. Spontaneous  echo contrast was present in the left atrium. No left atrial/left atrial  appendage thrombus was detected.  The LAA emptying velocity was 35 cm/s.   Right Atrium: Right atrial size was severely dilated. Prominent Eustachian  valve.   Pericardium: There is no evidence of pericardial effusion.   Mitral Valve: The mitral valve is normal in structure. Mild mitral valve  regurgitation, with centrally-directed jet.   Tricuspid Valve: There is a large, moderately mobile vegetation attached  to the septal leaflet of the tricuspid valve, measuring 27 mm in length  and 11 mm in width. It appears heterogeneous and friable. The tricuspid  valve is normal in structure.  Tricuspid valve regurgitation is moderate.   Aortic Valve: There is a large vegetation attached to the medial aspect of  posterior disc or the posteromedial mechanical valve  annulus. It is highly  mobile and prolapses readily across the valve orifice. It measures 15 mm  in length and 6 mm in width.  Although there is no evidence of a periannular abscess cavity, the  periannular tissue echodensity is heterogeneous, raining concern for  inflammation/early abscess formation. The proximity of the aortic  and the  tricuspid vegetation to the same area of the  interventricular septum also raises concern for intramyocardial abscess  or fistula formation. The aortic valve has been repaired/replaced. Aortic  valve regurgitation is trivial. Mild aortic stenosis is present. Aortic  valve mean gradient measures 20.0  mmHg. Aortic valve peak gradient measures 32.9 mmHg. There is a unknown  Medtronic tilting disk valve present in the aortic position. Procedure  Date: 2000.   Pulmonic Valve: The pulmonic valve was normal in structure. Pulmonic valve  regurgitation is trivial.   Aorta: The aortic root, ascending aorta, aortic arch and descending aorta  are all structurally normal, with no evidence of dilitation or  obstruction. There is mild (Grade II) atheroma plaque involving the  transverse aorta.   IAS/Shunts: No atrial level shunt detected by color flow Doppler.     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  His blood cultures are positive for Streptococcus infantarius which is a GI bacteria more commonly associated with colonic pathology.  He is status post repair of an acute type a aortic dissection with replacement of the ascending aorta and Bentall procedure using a mechanical valve graft and CABG x1 with saphenous  vein graft to the RCA which has been stented twice at the ostium and proximal portion.  I think it is unlikely that his endocarditis will resolve without valve replacement which would require redo Bentall procedure using a homograft or other bioprosthetic valve with revision of the saphenous vein graft to the RCA and tricuspid valve replacement.  He has had prior aortic dissection and prior CT scan in 2016 showed a persistent false lumen and the aortic arch arising just distal to the prior anastomosis and extending to the origin of the left subclavian artery.  There is bovine origin of the innominate and left common carotid artery.  The aortic arch and descending thoracic aorta did not appear aneurysmal at that time.  The morbidity and mortality of redo Bentall procedure is high especially considering his prior aortic dissection, morbid obesity, and poorly controlled diabetes.  He will require dental evaluation which we will initiate.  He will also require CTA of the chest, abdomen, and pelvis to fully evaluate his aorta and will require cardiac catheterization for coronary evaluation given his risk factors and prior  stenting of the saphenous vein graft to the RCA.  I discussed the pathology and treatment with the patient and his wife.  I discussed all the risk involved including the high risk of morbidity and mortality related to this problem.  I also told him that I did not think that he would get better if his valve was not replaced.  I discussed the possibility that he may have more destruction around the aortic valve and is visible on echocardiogram which would increase the complexity and risk of the surgery.  I will tentatively plan on surgery in the middle to end of next week depending on the timing of any dental treatment and cardiac catheterization.  I spent 60 minutes performing this consultation and > 50% of this time was spent face to face counseling and coordinating the care of this patient's  prosthetic aortic valve and native tricuspid valve endocarditis.  Fernande Boyden Coti Burd 09/12/2019, 1:22 PM

## 2019-09-12 NOTE — Telephone Encounter (Signed)
Incoming call received from radiology at the hospital pertaining to the CT that was ordered by Dr. Meda Coffee.  According to [facility] Dr. Meda Coffee is rounding. The radiology tech will try paging Dr. Cristy Friedlander for a second time.   The patient is currently in the hospital from having a cath today.

## 2019-09-12 NOTE — Progress Notes (Signed)
[place] for warfarin>>heparin Indication: atrial fibrillation + mechanical AVR  Allergies  Allergen Reactions  . Diltiazem Hives  . Insulin Glargine Swelling    edema    Patient Measurements: Height: 6\' 2"  (188 cm) Weight: 127.5 kg (281 lb) IBW/kg (Calculated) : 82.2  Vital Signs: Temp: 97.8 F (36.6 C) (04/22 0356) Temp Source: Oral (04/22 0356) BP: 118/75 (04/22 0937) Pulse Rate: 82 (04/22 0938)  Labs: Recent Labs    09/10/19 0448 09/11/19 0508 09/12/19 0535  HGB 10.5*  --  10.6*  HCT 29.7*  --  29.7*  PLT 213  --  252  LABPROT 32.0* 32.6* 31.0*  INR 3.1* 3.2* 3.0*  CREATININE 1.50* 1.27* 1.17    Estimated Creatinine Clearance: 102.4 mL/min (by C-G formula based on SCr of 1.17 mg/dL).   Medical History: Past Medical History:  Diagnosis Date  . Ascending aortic dissection ([place])    a. 2000 - with aortic valve involvement. S/p repair of aortic dissection with placement of mechanical AVR.  [name] CAD (coronary artery disease)    a. 2000 VG->PDA @ time of Ao dissection repair;  b. Cardiac CT 06/2010: no CAD, only mild plaque in prox RCA;  c. 2015 Cath: LM nl, LAD nl, LCX nl, RCA 100, VG->PDA 90 (4.5x12 Rebel BMS). d. 10/23/17 instent restenosis SVG to RCA, DES placed.  . Cholelithiasis 08/22/2013  . Chronic diastolic CHF (congestive heart failure) ([place])    a. 03/2016 Echo: EF 55-60%, no rwma, Gr2 DD.  . Diabetes mellitus   . Dyspnea   . Gross hematuria   . H/O mechanical aortic valve replacement    a. 03/2016 Echo: AoV mean gradient 60mmHg, Valve Area (VTI) 1.36 cm^2, (Vmax) 1.22 cm^2, mod dil LA.  [name] Hyperlipidemia   . Hypertension   . NEPHROLITHIASIS, HX OF   . Obesity   . PAF (paroxysmal atrial fibrillation) ([place])    a. H/o such, with recurrence of coarse afib/flutter during ER consult 03/2012.  . Stroke ([place])   . TRANSIENT ISCHEMIC ATTACK, HX OF    a. In 2004.  [name] Unspecified vitamin D deficiency    Assessment: 26 YOM  presenting with SOB and N/V, on warfarin PTA for Afib and mechanical AVR. Vegetation seen on TTE, will need to hold warfarin and begin IV heparin with impending need for procedures.  INR this morning continues to be therapeutic at 3.0, CBC has been stable.  PTA dosing: 5mg  MWF, 7.5mg  all other days  Goal of Therapy:  INR 2.5-3.5 Monitor platelets by anticoagulation protocol: Yes   Plan:  Hold warfarin for now Begin heparin once < 2.5  Erin Hearing PharmD., BCPS Clinical Pharmacist 09/12/2019 10:24 AM

## 2019-09-12 NOTE — Progress Notes (Signed)
Pre-CABG testing has been completed. Preliminary results can be found in CV Proc through chart review.   09/12/19 10:35 AM [name] RVT

## 2019-09-12 NOTE — Progress Notes (Signed)
Progress Note  Patient Name:  [name] Date of Encounter: 09/12/2019  Primary Cardiologist: Kirk Ruths, MD   Subjective   Denis any chest pain or dyspnea  Inpatient Medications    Scheduled Meds: . aspirin EC  81 mg Oral Daily  . atorvastatin  40 mg Oral Q breakfast  . gabapentin  300 mg Oral Q breakfast  . insulin aspart  0-15 Units Subcutaneous TID WC  . insulin aspart  0-5 Units Subcutaneous QHS  . insulin aspart  6 Units Subcutaneous TID WC  . insulin detemir  6 Units Subcutaneous BID  . levothyroxine  50 mcg Oral Q0600  . polyethylene glycol  17 g Oral BID  . sodium chloride flush  3 mL Intravenous Once   Continuous Infusions: . cefTRIAXone (ROCEPHIN)  IV 2 g (09/11/19 1440)  . gentamicin 300 mg (09/11/19 2134)   PRN Meds: acetaminophen **OR** acetaminophen, iohexol, nitroGLYCERIN, ondansetron **OR** ondansetron (ZOFRAN) IV   Vital Signs    Vitals:   09/11/19 2114 09/12/19 0356 09/12/19 0937 09/12/19 0938  BP: 112/81 132/87 118/75   Pulse: 90 84  82  Resp: 20 20    Temp: 98.4 F (36.9 C) 97.8 F (36.6 C)    TempSrc: Oral Oral    SpO2: 99% 99%    Weight:  127.5 kg    Height:        Intake/Output Summary (Last 24 hours) at 09/12/2019 1214 Last data filed at 09/12/2019 1100 Gross per 24 hour  Intake 590 ml  Output 900 ml  Net -310 ml   Last 3 Weights 09/12/2019 09/11/2019 09/10/2019  Weight (lbs) 281 lb 279 lb 15.8 oz 280 lb 9.6 oz  Weight (kg) 127.461 kg 127 kg 127.279 kg      Telemetry    Sinus rhythm HR in the 70-80s, 6 beat run of NSVT - Personally Reviewed  ECG    No new tracings - Personally Reviewed  Physical Exam   GEN: No acute distress.   Neck: No JVD Cardiac: RRR, mechanical click, 2/6 systolic murmur  Respiratory: Clear to auscultation bilaterally. GI: Soft, nontender, non-distended  MS: No edema; No deformity. Neuro:  Nonfocal  Psych: Normal affect   Labs    High Sensitivity Troponin:   Recent Labs  Lab  09/08/19 0810 09/08/19 1100  TROPONINIHS 34* 28*      Chemistry Recent Labs  Lab 09/09/19 0406 09/09/19 0406 09/10/19 0448 09/11/19 0508 09/12/19 0535  NA 138   < > 137 137 137  K 3.9   < > 3.5 3.3* 3.7  CL 104   < > 104 108 107  CO2 24   < > 23 22 22   GLUCOSE 187*   < > 181* 203* 199*  BUN 13   < > 16 15 13   CREATININE 1.11   < > 1.50* 1.27* 1.17  CALCIUM 8.9   < > 8.6* 8.4* 8.7*  PROT 7.2  --   --   --   --   ALBUMIN 2.5*  --  2.6*  --   --   AST 23  --   --   --   --   ALT 15  --   --   --   --   ALKPHOS 80  --   --   --   --   BILITOT 1.1  --   --   --   --   GFRNONAA >60   < > 52* >60 >60  GFRAA >  60   < > >60 >60 >60  ANIONGAP 10   < > 10 7 8    < > = values in this interval not displayed.     Hematology Recent Labs  Lab 09/09/19 0406 09/10/19 0448 09/12/19 0535  WBC 7.7 9.4 10.7*  RBC 3.53* 3.68* 3.69*  HGB 10.2* 10.5* 10.6*  HCT 28.7* 29.7* 29.7*  MCV 81.3 80.7 80.5  MCH 28.9 28.5 28.7  MCHC 35.5 35.4 35.7  RDW 14.4 14.2 14.2  PLT 194 213 252    BNP Recent Labs  Lab 09/08/19 1100  BNP 535.2*     DDimer No results for input(s): DDIMER in the last 168 hours.   Radiology    DG Orthopantogram  Result Date: 09/11/2019 CLINICAL DATA:  Pre-op testing. Molar pain. EXAM: ORTHOPANTOGRAM/PANORAMIC COMPARISON:  None. FINDINGS: Several teeth are absent. There is typical midline artifact of the Panorex technique. Significant dental caries involving the right maxillary wisdom tooth and the most posterior remaining left mandibular molar. That tooth also demonstrates significant periodontal disease. No evidence of acute fracture or dislocation. IMPRESSION: Dental caries and periodontal disease as described. Electronically Signed   By: Richardean Sale M.D.   On: 09/11/2019 18:04   ECHO TEE  Result Date: 09/11/2019    TRANSESOPHOGEAL ECHO REPORT   Patient Name:   [name] Date of Exam: 09/11/2019 Medical Rec #:  [id number]        Height:       74.0 in  Accession #:    [id number]       Weight:       280.0 lb Date of Birth:  12-Jan-1966        BSA:          2.508 m Patient Age:    54 years         BP:           108/65 mmHg Patient Gender: M                HR:           106 bpm. Exam Location:  Inpatient Procedure: Transesophageal Echo, Cardiac Doppler and Color Doppler Indications:     Bacteremia.  History:         Patient has prior history of Echocardiogram examinations, most                  recent 09/09/2019. CAD, Aortic Valve Disease, Endocarditis,                  Tricuspid vegetation and Prosthetic Valve Complications,                  Signs/Symptoms:Altered Mental Status; Risk Factors:Diabetes,                  Hypertension and Dyslipidemia. Aortic valve replacement.                  Bentall procedure. Aortic dissection.                  Aortic Valve: unknown Medtronic tilting disk valve is present                  in the aortic position. Procedure Date: 2000.  Sonographer:     Roseanna Rainbow RDCS Referring Phys:  [id number] RHONDA G BARRETT Diagnosing Phys: Sanda Klein MD PROCEDURE: After discussion of the risks and benefits of a TEE, an informed consent was obtained from the patient. The  transesophogeal probe was passed without difficulty through the esophogus of the patient. Imaged were obtained with the patient in a left lateral decubitus position. Sedation performed by different physician. The patient was monitored while under deep sedation. Anesthestetic sedation was provided intravenously by Anesthesiology: 230mg  of Propofol. The patient's vital signs; including heart rate, blood pressure, and oxygen saturation; remained stable throughout the procedure. The patient developed no complications during the procedure. IMPRESSIONS  1. Left ventricular ejection fraction, by estimation, is 55 to 60%. The left ventricle has normal function. The left ventricle has no regional wall motion abnormalities. There is mild concentric left ventricular hypertrophy.  2. Right  ventricular systolic function is normal. The right ventricular size is mildly enlarged. There is mildly elevated pulmonary artery systolic pressure. The estimated right ventricular systolic pressure is [id number] mmHg.  3. It appears that the left atrial appendage has been surgically resected or oversown. Left atrial size was severely dilated. No left atrial/left atrial appendage thrombus was detected. The LAA emptying velocity was 35 cm/s.  4. Right atrial size was severely dilated.  5. The mitral valve is normal in structure. Mild mitral valve regurgitation.  6. There is a large, moderately mobile vegetation attached to the septal leaflet of the tricuspid valve, measuring 27 mm in length and 11 mm in width. It appears heterogeneous and friable.. Tricuspid valve regurgitation is moderate.  7. There is a large vegetation attached to the medial aspect of posterior disc or the posteromedial mechanical valve annulus. It is highly mobile and prolapses readily across the valve orifice. It measures 15 mm in length and 6 mm in width. Although there is no evidence of a periannular abscess cavity, the periannular tissue echodensity is heterogeneous, raining concern for inflammation/early abscess formation. The proximity of the aortic and the tricuspid vegetation to the same area of the interventricular septum also raises concern for intramyocardial abscess or fistula formation. The aortic valve has been repaired/replaced. Aortic valve regurgitation is trivial. Mild aortic valve stenosis. There is a unknown Medtronic tilting disk valve present in the aortic position. Procedure Date: 2000. Aortic valve mean gradient measures 20.0 mmHg.  8. There is mild (Grade II) atheroma plaque involving the transverse aorta. Comparison(s): Compared to previous reports, there is evidence of vegetations on the tricuspid valve and the mechanical aortic valve and suspicion for early perivalvular abscess. The dimensionless aortic vale index is similar  to older studies. This suggests that the increased aortic valve gradients are due to increased cardiac output (rather than obstruction from the vegetation or compromised prosthetic disc motion). FINDINGS  Left Ventricle: Left ventricular ejection fraction, by estimation, is 55 to 60%. The left ventricle has normal function. The left ventricle has no regional wall motion abnormalities. The left ventricular internal cavity size was normal in size. There is  mild concentric left ventricular hypertrophy. Abnormal (paradoxical) septal motion consistent with post-operative status. Right Ventricle: The right ventricular size is mildly enlarged. No increase in right ventricular wall thickness. Right ventricular systolic function is normal. There is mildly elevated pulmonary artery systolic pressure. The tricuspid regurgitant velocity is 2.48 m/s, and with an assumed right atrial pressure of 8 mmHg, the estimated right ventricular systolic pressure is [id number] mmHg. Left Atrium: It appears that the left atrial appendage has been surgically resected or oversown. Left atrial size was severely dilated. Spontaneous echo contrast was present in the left atrium. No left atrial/left atrial appendage thrombus was detected. The LAA emptying velocity was 35 cm/s. Right Atrium: Right atrial size was  severely dilated. Prominent Eustachian valve. Pericardium: There is no evidence of pericardial effusion. Mitral Valve: The mitral valve is normal in structure. Mild mitral valve regurgitation, with centrally-directed jet. Tricuspid Valve: There is a large, moderately mobile vegetation attached to the septal leaflet of the tricuspid valve, measuring 27 mm in length and 11 mm in width. It appears heterogeneous and friable. The tricuspid valve is normal in structure. Tricuspid valve regurgitation is moderate. Aortic Valve: There is a large vegetation attached to the medial aspect of posterior disc or the posteromedial mechanical valve annulus. It  is highly mobile and prolapses readily across the valve orifice. It measures 15 mm in length and 6 mm in width. Although there is no evidence of a periannular abscess cavity, the periannular tissue echodensity is heterogeneous, raining concern for inflammation/early abscess formation. The proximity of the aortic and the tricuspid vegetation to the same area of the  interventricular septum also raises concern for intramyocardial abscess or fistula formation. The aortic valve has been repaired/replaced. Aortic valve regurgitation is trivial. Mild aortic stenosis is present. Aortic valve mean gradient measures 20.0 mmHg. Aortic valve peak gradient measures 32.9 mmHg. There is a unknown Medtronic tilting disk valve present in the aortic position. Procedure Date: 2000. Pulmonic Valve: The pulmonic valve was normal in structure. Pulmonic valve regurgitation is trivial. Aorta: The aortic root, ascending aorta, aortic arch and descending aorta are all structurally normal, with no evidence of dilitation or obstruction. There is mild (Grade II) atheroma plaque involving the transverse aorta. IAS/Shunts: No atrial level shunt detected by color flow Doppler.  AORTIC VALVE AV Vmax:           287.00 cm/s AV Vmean:          208.000 cm/s AV VTI:            0.444 m AV Peak Grad:      32.9 mmHg AV Mean Grad:      20.0 mmHg LVOT Vmax:         126.00 cm/s LVOT Vmean:        90.500 cm/s LVOT VTI:          0.223 m LVOT/AV VTI ratio: 0.50 TRICUSPID VALVE TR Peak grad:   24.6 mmHg TR Vmax:        248.00 cm/s  SHUNTS Systemic VTI: 0.22 m Dani Gobble Croitoru MD Electronically signed by Sanda Klein MD Signature Date/Time: 09/11/2019/2:35:26 PM    Final    VAS US DOPPLER PRE CABG  Result Date: 09/12/2019 PREOPERATIVE VASCULAR EVALUATION  Indications:      Pre-CABG. Risk Factors:     Hypertension, hyperlipidemia, Diabetes. Comparison Study: No prior studies. Performing Technologist: Carlos Levering Rvt  Examination Guidelines: A complete  evaluation includes B-mode imaging, spectral Doppler, color Doppler, and power Doppler as needed of all accessible portions of each vessel. Bilateral testing is considered an integral part of a complete examination. Limited examinations for reoccurring indications may be performed as noted.  Right Carotid Findings: +----------+--------+--------+--------+-----------------------+--------+           PSV cm/sEDV cm/sStenosisDescribe               Comments +----------+--------+--------+--------+-----------------------+--------+ CCA Prox  157     16              smooth and heterogenoustortuous +----------+--------+--------+--------+-----------------------+--------+ CCA Distal46      13              smooth and heterogenous         +----------+--------+--------+--------+-----------------------+--------+  ICA Prox  50      16              smooth and heterogenous         +----------+--------+--------+--------+-----------------------+--------+ ICA Distal42      18                                     tortuous +----------+--------+--------+--------+-----------------------+--------+ ECA       74      8                                               +----------+--------+--------+--------+-----------------------+--------+ Portions of this table do not appear on this page. +----------+--------+-------+--------+------------+           PSV cm/sEDV cmsDescribeArm Pressure +----------+--------+-------+--------+------------+ Subclavian81                     134          +----------+--------+-------+--------+------------+ +---------+--------+--+--------+--+---------+ VertebralPSV cm/s33EDV cm/s10Antegrade +---------+--------+--+--------+--+---------+ Left Carotid Findings: +----------+--------+--------+--------+-----------------------+--------+           PSV cm/sEDV cm/sStenosisDescribe               Comments  +----------+--------+--------+--------+-----------------------+--------+ CCA Prox  65      16              smooth and heterogenous         +----------+--------+--------+--------+-----------------------+--------+ CCA Distal62      9               smooth and heterogenous         +----------+--------+--------+--------+-----------------------+--------+ ICA Prox  40      17                                     tortuous +----------+--------+--------+--------+-----------------------+--------+ ICA Distal56      29                                     tortuous +----------+--------+--------+--------+-----------------------+--------+ ECA       45      6                                               +----------+--------+--------+--------+-----------------------+--------+ +----------+--------+--------+--------+------------+ SubclavianPSV cm/sEDV cm/sDescribeArm Pressure +----------+--------+--------+--------+------------+           71                      136          +----------+--------+--------+--------+------------+ +---------+--------+--+--------+-+---------+ VertebralPSV cm/s38EDV cm/s9Antegrade +---------+--------+--+--------+-+---------+  ABI Findings: +--------+------------------+-----+---------+--------+ Right   Rt Pressure (mmHg)IndexWaveform Comment  +--------+------------------+-----+---------+--------+ [id number]                    triphasic         +--------+------------------+-----+---------+--------+ PTA     147               1.08 triphasic         +--------+------------------+-----+---------+--------+ DP      176  1.29 triphasic         +--------+------------------+-----+---------+--------+ +--------+------------------+-----+---------+-------+ Left    Lt Pressure (mmHg)IndexWaveform Comment +--------+------------------+-----+---------+-------+ [id number]                    triphasic         +--------+------------------+-----+---------+-------+ PTA     122               0.90 triphasic        +--------+------------------+-----+---------+-------+ DP      155               1.14 triphasic        +--------+------------------+-----+---------+-------+ +-------+---------------+----------------+ ABI/TBIToday's ABI/TBIPrevious ABI/TBI +-------+---------------+----------------+ Right  1.29                            +-------+---------------+----------------+ Left   1.14                            +-------+---------------+----------------+  Right Doppler Findings: +--------+--------+-----+---------+--------+ Site    PressureIndexDoppler  Comments +--------+--------+-----+---------+--------+ [id number]          triphasic         +--------+--------+-----+---------+--------+ Radial               triphasic         +--------+--------+-----+---------+--------+ Ulnar                triphasic         +--------+--------+-----+---------+--------+  Left Doppler Findings: +--------+--------+-----+---------+--------+ Site    PressureIndexDoppler  Comments +--------+--------+-----+---------+--------+ [id number]          triphasic         +--------+--------+-----+---------+--------+ Radial               triphasic         +--------+--------+-----+---------+--------+ Ulnar                triphasic         +--------+--------+-----+---------+--------+  Summary: Right Carotid: Velocities in the right ICA are consistent with a 1-39% stenosis. Left Carotid: Velocities in the left ICA are consistent with a 1-39% stenosis. Vertebrals: Bilateral vertebral arteries demonstrate antegrade flow. Right ABI: Resting right ankle-brachial index is within normal range. No evidence of significant right lower extremity arterial disease. Left ABI: Resting left ankle-brachial index is within normal range. No evidence of significant left lower extremity arterial disease. Right  Upper Extremity: Doppler waveforms decrease >50% with right radial compression. Doppler waveform obliterate with right ulnar compression. Left Upper Extremity: Doppler waveform obliterate with left radial compression. Doppler waveforms remain within normal limits with left ulnar compression.    Preliminary     Cardiac Studies   Echo 09/09/19: 1. Tricuspid valve vegetation. Discussed with referring provider.  2. Left ventricular ejection fraction, by estimation, is 55 to 60%. The  left ventricle has normal function. The left ventricle has no regional  wall motion abnormalities. Left ventricular diastolic parameters were  normal.  3. Right ventricular systolic function is normal. The right ventricular  size is mildly enlarged. There is normal pulmonary artery systolic  pressure.  4. Left atrial size was moderately dilated.  5. Right atrial size was severely dilated.  6. The mitral valve is normal in structure. Mild mitral valve  regurgitation. No evidence of mitral stenosis.  7. There is a 1.8 x 1.1 cm tricuspid valve mass on the atrial surface  consistent with vegetation. . Tricuspid valve  regurgitation is moderate.  8. Transaortic velocity is higher than expected for mechanical aortic  valve. The aortic valve is normal in structure. Aortic valve regurgitation  is not visualized. No aortic stenosis is present. There is a mechanical  valve present in the aortic  position. Procedure Date: 2000. Aortic valve mean gradient measures 33.5  mmHg. Aortic valve Vmax measures 3.68 m/s.  9. Aortic root/ascending aorta has been repaired/replaced and Bentall.  10. The inferior vena cava is normal in size with greater than 50%  respiratory variability, suggesting right atrial pressure of 3 mmHg.   TEE 09/11/19: 1. Left ventricular ejection fraction, by estimation, is 55 to 60%. The  left ventricle has normal function. The left ventricle has no regional  wall motion abnormalities. There is  mild concentric left ventricular  hypertrophy.  2. Right ventricular systolic function is normal. The right ventricular  size is mildly enlarged. There is mildly elevated pulmonary artery  systolic pressure. The estimated right ventricular systolic pressure is  [id number] mmHg.  3. It appears that the left atrial appendage has been surgically resected  or oversown. Left atrial size was severely dilated. No left atrial/left  atrial appendage thrombus was detected. The LAA emptying velocity was 35  cm/s.  4. Right atrial size was severely dilated.  5. The mitral valve is normal in structure. Mild mitral valve  regurgitation.  6. There is a large, moderately mobile vegetation attached to the septal  leaflet of the tricuspid valve, measuring 27 mm in length and 11 mm in  width. It appears heterogeneous and friable.. Tricuspid valve  regurgitation is moderate.  7. There is a large vegetation attached to the medial aspect of posterior  disc or the posteromedial mechanical valve annulus. It is highly mobile  and prolapses readily across the valve orifice. It measures 15 mm in  length and 6 mm in width. Although  there is no evidence of a periannular abscess cavity, the periannular  tissue echodensity is heterogeneous, raining concern for  inflammation/early abscess formation. The proximity of the aortic and the  tricuspid vegetation to the same area of the  interventricular septum also raises concern for intramyocardial abscess or  fistula formation. The aortic valve has been repaired/replaced. Aortic  valve regurgitation is trivial. Mild aortic valve stenosis. There is a  unknown Medtronic tilting disk valve  present in the aortic position. Procedure Date: 2000. Aortic valve mean  gradient measures 20.0 mmHg.  8. There is mild (Grade II) atheroma plaque involving the transverse  aorta.   Patient Profile     54 y.o. male with history of Bentall procedure in 2000 with CABG, SVG-RCA PCI  with BMS in Feb 2015, he had re-do PCI in June 2019 DES to SVG graft, afib on coumadin, TIA in 2004, NIDDM, HTN, and diabetes who is being seen for dyspnea. Admitted with fever and GI symptoms.   Assessment & Plan    Infective endocarditis: TEE 09/11/2019 shows large vegetation on mechanical aortic valve, along with large mobile vegetation on the tricuspid valve.  Heterogeneous periannular tissue, possible early abscess formation.  Blood cultures 4/19 positive for strep infantarius.  Blood cultures 4/20 NGTD -Cardiac surgery consulted -Will need heart catheterization prior to surgery.  INR 3.0.  Discussed with surgery team whether to give vitamin K for cath tomorrow or let INR continue trend down with holding warfarin (last dose 4/18) and plan cath Monday.  Per surgery team, will plan for cath Monday -ID consulted, on ceftriaxone and gentamicin  CAD s/p CABG with subsequent PCI Bental procedure with mechanical AVR - coumadin on hold - INR 3.0.  Start heparin gtt once INR <2.5 - no angina.  Will need LHC as above prior to surgery for IE.  LHC planned for Monday - hs troponin 34 --> 28: low and flat more consistent with demand ischemia in the setting of acute illness - plavix held due to need for surgery (last dose 4/19), started ASA   Paroxysmal atrial fibrillation - chronic anticoagulation with coumadin - maintaining sinus rhythm - holding beta blocker for now - telemetry with sinus rhythm, PVCs  Hypertension: pressure marginal, holding home antihypertensives  AKI: Cr 1.5 on 4/20.  Suspect was dry from p.o. Lasix and having vomiting and diarrhea.  Creatinine has normalized with holding Lasix.   For questions or updates, please contact [place] Please consult [email or web address] for contact info under      Signed, Donato Heinz, MD

## 2019-09-12 NOTE — Progress Notes (Signed)
[facility]   Reason for visit: Follow up on PV endocarditis  Interval History: Seen by Dr. Cyndia Bent and tentative plan for surgery next week.  Otherwise afebrile, WBC 10.7.  Wife at bedside.   Ceftriaxone day 4 Gentamicin day 2  Physical Exam: Constitutional:  Vitals:   09/12/19 0937 09/12/19 0938  BP: 118/75   Pulse:  82  Resp:    Temp:    SpO2:     patient appears in NAD Respiratory: Normal respiratory effort; CTA B Cardiovascular: RRR with audible click GI: soft, nt, nd  Review of Systems: Constitutional: negative for fevers and chills Gastrointestinal: negative for nausea and diarrhea Integument/breast: negative for rash  Lab Results  Component Value Date   WBC 10.7 (H) 09/12/2019   HGB 10.6 (L) 09/12/2019   HCT 29.7 (L) 09/12/2019   MCV 80.5 09/12/2019   PLT 252 09/12/2019    Lab Results  Component Value Date   CREATININE 1.17 09/12/2019   BUN 13 09/12/2019   NA 137 09/12/2019   K 3.7 09/12/2019   CL 107 09/12/2019   CO2 22 09/12/2019    Lab Results  Component Value Date   ALT 15 09/09/2019   AST 23 09/09/2019   ALKPHOS 80 09/09/2019     Microbiology: Recent Results (from the past 240 hour(s))  SARS CORONAVIRUS 2 (TAT 6-24 HRS) Nasopharyngeal Nasopharyngeal Swab     Status: None   Collection Time: 09/08/19  1:50 PM   Specimen: Nasopharyngeal Swab  Result Value Ref Range Status   SARS Coronavirus 2 NEGATIVE NEGATIVE Final    Comment: (NOTE) SARS-CoV-2 target nucleic acids are NOT DETECTED. The SARS-CoV-2 RNA is generally detectable in upper and lower respiratory specimens during the acute phase of infection. Negative results do not preclude SARS-CoV-2 infection, do not rule out co-infections with other pathogens, and should not be used as the sole basis for treatment or other patient management decisions. Negative results must be combined with clinical observations, patient history, and epidemiological information.  The expected result is Negative. Fact Sheet for Patients: SugarRoll.be Fact Sheet for Healthcare Providers: https://www.woods-mathews.com/ This test is not yet approved or cleared by the [place] FDA and  has been authorized for detection and/or diagnosis of SARS-CoV-2 by FDA under an Emergency Use Authorization (EUA). This EUA will remain  in effect (meaning this test can be used) for the duration of the COVID-19 declaration under Section 56 4(b)(1) of the Act, 21 U.S.C. section 360bbb-3(b)(1), unless the authorization is terminated or revoked sooner. Performed at [facility], [place] [address] [place], [place] [address]   Gastrointestinal Panel by PCR , Stool     Status: None   Collection Time: 09/09/19 12:21 PM   Specimen: Stool  Result Value Ref Range Status   Campylobacter species NOT DETECTED NOT DETECTED Final   Plesimonas shigelloides NOT DETECTED NOT DETECTED Final   Salmonella species NOT DETECTED NOT DETECTED Final   Yersinia enterocolitica NOT DETECTED NOT DETECTED Final   Vibrio species NOT DETECTED NOT DETECTED Final   Vibrio cholerae NOT DETECTED NOT DETECTED Final   Enteroaggregative E coli (EAEC) NOT DETECTED NOT DETECTED Final   Enteropathogenic E coli (EPEC) NOT DETECTED NOT DETECTED Final   Enterotoxigenic E coli (ETEC) NOT DETECTED NOT DETECTED Final   Shiga like toxin producing E coli (STEC) NOT DETECTED NOT DETECTED Final   Shigella/Enteroinvasive E coli (EIEC) NOT DETECTED NOT DETECTED Final   Cryptosporidium NOT DETECTED NOT DETECTED Final  Cyclospora cayetanensis NOT DETECTED NOT DETECTED Final   Entamoeba histolytica NOT DETECTED NOT DETECTED Final   Giardia lamblia NOT DETECTED NOT DETECTED Final   Adenovirus F40/41 NOT DETECTED NOT DETECTED Final   Astrovirus NOT DETECTED NOT DETECTED Final   Norovirus GI/GII NOT DETECTED NOT DETECTED Final   Rotavirus A NOT DETECTED NOT DETECTED Final   Sapovirus  (I, II, IV, and V) NOT DETECTED NOT DETECTED Final    Comment: Performed at [facility], [place] [place], [place] [address]  Culture, blood (Routine X 2) w Reflex to ID Panel     Status: Abnormal   Collection Time: 09/09/19  1:12 PM   Specimen: BLOOD  Result Value Ref Range Status   Specimen Description BLOOD RIGHT ANTECUBITAL  Final   Special Requests   Final    BOTTLES DRAWN AEROBIC AND ANAEROBIC Blood Culture adequate volume Performed at [facility] [address], [place], [place] [address]    Culture  Setup Time   Final    GRAM POSITIVE COCCI CRITICAL RESULT CALLED TO, READ BACK BY AND VERIFIED WITH: PHARMD SEAY LAURA AT 0546  BY MESSAN HOUEGNIFIO ON 09/10/2019     Culture STREPTOCOCCUS INFANTARIUS (A)  Final   Report Status 09/12/2019 FINAL  Final   Organism ID, Bacteria STREPTOCOCCUS INFANTARIUS  Final      Susceptibility   Streptococcus infantarius - MIC*    PENICILLIN 0.25 INTERMEDIATE Intermediate     CEFTRIAXONE 0.25 SENSITIVE Sensitive     ERYTHROMYCIN <=0.12 SENSITIVE Sensitive     LEVOFLOXACIN 2 SENSITIVE Sensitive     VANCOMYCIN <=0.12 SENSITIVE Sensitive     * STREPTOCOCCUS INFANTARIUS  Culture, blood (Routine X 2) w Reflex to ID Panel     Status: Abnormal   Collection Time: 09/09/19  1:12 PM   Specimen: BLOOD LEFT HAND  Result Value Ref Range Status   Specimen Description BLOOD LEFT HAND  Final   Special Requests   Final    BOTTLES DRAWN AEROBIC AND ANAEROBIC Blood Culture adequate volume   Culture  Setup Time   Final    AEROBIC BOTTLE ONLY GRAM POSITIVE COCCI IN CHAINS GRAM POSITIVE COCCI IN CLUSTERS CRITICAL VALUE NOTED.  VALUE IS CONSISTENT WITH PREVIOUSLY REPORTED AND CALLED VALUE.    Culture (A)  Final    STREPTOCOCCUS INFANTARIUS SUSCEPTIBILITIES PERFORMED ON PREVIOUS CULTURE WITHIN THE LAST 5 DAYS. Performed at [facility], [place] [address], [place], [place] [address]    Report Status 09/12/2019 FINAL  Final  Blood  Culture ID Panel (Reflexed)     Status: Abnormal   Collection Time: 09/09/19  1:12 PM  Result Value Ref Range Status   Enterococcus species NOT DETECTED NOT DETECTED Final   Listeria monocytogenes NOT DETECTED NOT DETECTED Final   Staphylococcus species NOT DETECTED NOT DETECTED Final   Staphylococcus aureus (BCID) NOT DETECTED NOT DETECTED Final   Streptococcus species DETECTED (A) NOT DETECTED Final    Comment: Not Enterococcus species, Streptococcus agalactiae, Streptococcus pyogenes, or Streptococcus pneumoniae. CRITICAL RESULT CALLED TO, READ BACK BY AND VERIFIED WITH: Criss Rosales PharmD 12:50 09/10/19 ([email or web address])    Streptococcus agalactiae NOT DETECTED NOT DETECTED Final   Streptococcus pneumoniae NOT DETECTED NOT DETECTED Final   Streptococcus pyogenes NOT DETECTED NOT DETECTED Final   Acinetobacter baumannii NOT DETECTED NOT DETECTED Final   Enterobacteriaceae species NOT DETECTED NOT DETECTED Final   Enterobacter cloacae complex NOT DETECTED NOT DETECTED Final   Escherichia coli NOT DETECTED  NOT DETECTED Final   Klebsiella oxytoca NOT DETECTED NOT DETECTED Final   Klebsiella pneumoniae NOT DETECTED NOT DETECTED Final   Proteus species NOT DETECTED NOT DETECTED Final   Serratia marcescens NOT DETECTED NOT DETECTED Final   Haemophilus influenzae NOT DETECTED NOT DETECTED Final   Neisseria meningitidis NOT DETECTED NOT DETECTED Final   Pseudomonas aeruginosa NOT DETECTED NOT DETECTED Final   Candida albicans NOT DETECTED NOT DETECTED Final   Candida glabrata NOT DETECTED NOT DETECTED Final   Candida krusei NOT DETECTED NOT DETECTED Final   Candida parapsilosis NOT DETECTED NOT DETECTED Final   Candida tropicalis NOT DETECTED NOT DETECTED Final    Comment: Performed at [facility], [place] [address], [place], [place] [address]  Culture, blood (routine x 2)     Status: None (Preliminary result)   Collection Time: 09/10/19  2:46 PM   Specimen: BLOOD RIGHT FOREARM  Result Value Ref  Range Status   Specimen Description BLOOD RIGHT FOREARM  Final   Special Requests   Final    BOTTLES DRAWN AEROBIC AND ANAEROBIC Blood Culture adequate volume   Culture   Final    NO GROWTH 2 DAYS Performed at [facility], [place] [address] [place], [place] [address]    Report Status PENDING  Incomplete  Culture, blood (routine x 2)     Status: None (Preliminary result)   Collection Time: 09/10/19  2:51 PM   Specimen: BLOOD RIGHT WRIST  Result Value Ref Range Status   Specimen Description BLOOD RIGHT WRIST  Final   Special Requests   Final    BOTTLES DRAWN AEROBIC ONLY Blood Culture adequate volume   Culture   Final    NO GROWTH 2 DAYS Performed at [facility], [place] [address], [place],  [address]    Report Status PENDING  Incomplete    Impression/Plan:  1. PV endocarditis - vegetation on the posterior disc of the mechanical AV and large vegetation on native TV.  Seen by Dr. Cyndia Bent and plan for surgery.   Repeat blood cultures ngtd. Penicillin intermediate so will continue with ceftriaxone + gentamicin.    2.  Medication monitoring - creat improved today.  Will continue monitoring on gentamicin.    3.  Access - ok for a picc line if needed tomorrow if blood cultures remain negative.    Will follow up again next week, will continue to monitor creat on gentamicin.

## 2019-09-12 NOTE — Progress Notes (Signed)
Contacted by [facility] Radiology re: CT Scan unexpected finding. Radiologist to contact ordering provider, Dr Meda Coffee.

## 2019-09-13 ENCOUNTER — Encounter (HOSPITAL_COMMUNITY): Payer: Self-pay | Admitting: Internal Medicine

## 2019-09-13 DIAGNOSIS — I350 Nonrheumatic aortic (valve) stenosis: Secondary | ICD-10-CM

## 2019-09-13 DIAGNOSIS — Z952 Presence of prosthetic heart valve: Secondary | ICD-10-CM | POA: Diagnosis not present

## 2019-09-13 DIAGNOSIS — Z01818 Encounter for other preprocedural examination: Secondary | ICD-10-CM

## 2019-09-13 DIAGNOSIS — I330 Acute and subacute infective endocarditis: Secondary | ICD-10-CM | POA: Diagnosis not present

## 2019-09-13 DIAGNOSIS — I480 Paroxysmal atrial fibrillation: Secondary | ICD-10-CM | POA: Diagnosis not present

## 2019-09-13 DIAGNOSIS — I079 Rheumatic tricuspid valve disease, unspecified: Secondary | ICD-10-CM

## 2019-09-13 LAB — BASIC METABOLIC PANEL
Anion gap: 8 (ref 5–15)
BUN: 13 mg/dL (ref 6–20)
CO2: 23 mmol/L (ref 22–32)
Calcium: 8.4 mg/dL — ABNORMAL LOW (ref 8.9–10.3)
Chloride: 107 mmol/L (ref 98–111)
Creatinine, Ser: 1.01 mg/dL (ref 0.61–1.24)
GFR calc Af Amer: >60 mL/min (ref >60)
GFR calc non Af Amer: >60 mL/min (ref >60)
Glucose, Bld: 160 mg/dL — ABNORMAL HIGH (ref 70–99)
Potassium: 3.7 mmol/L (ref 3.5–5.1)
Sodium: 138 mmol/L (ref 135–145)

## 2019-09-13 LAB — CBC WITH DIFFERENTIAL/PLATELET
Abs Immature Granulocytes: 0.04 10*3/uL (ref 0.00–0.07)
Basophils Absolute: 0 10*3/uL (ref 0.0–0.1)
Basophils Relative: 0 %
Eosinophils Absolute: 0.2 10*3/uL (ref 0.0–0.5)
Eosinophils Relative: 2 %
HCT: 25.4 % — ABNORMAL LOW (ref 39.0–52.0)
Hemoglobin: 8.9 g/dL — ABNORMAL LOW (ref 13.0–17.0)
Immature Granulocytes: 1 %
Lymphocytes Relative: 14 %
Lymphs Abs: 1.1 10*3/uL (ref 0.7–4.0)
MCH: 28.3 pg (ref 26.0–34.0)
MCHC: 35 g/dL (ref 30.0–36.0)
MCV: 80.9 fL (ref 80.0–100.0)
Monocytes Absolute: 0.8 10*3/uL (ref 0.1–1.0)
Monocytes Relative: 10 %
Neutro Abs: 5.7 10*3/uL (ref 1.7–7.7)
Neutrophils Relative %: 73 %
Platelets: 200 10*3/uL (ref 150–400)
RBC: 3.14 MIL/uL — ABNORMAL LOW (ref 4.22–5.81)
RDW: 14.2 % (ref 11.5–15.5)
WBC: 7.7 10*3/uL (ref 4.0–10.5)
nRBC: 0 % (ref 0.0–0.2)

## 2019-09-13 LAB — HEPATIC FUNCTION PANEL
ALT: 12 U/L (ref 0–44)
AST: 19 U/L (ref 15–41)
Albumin: 2.3 g/dL — ABNORMAL LOW (ref 3.5–5.0)
Alkaline Phosphatase: 64 U/L (ref 38–126)
Bilirubin, Direct: 0.1 mg/dL (ref 0.0–0.2)
Indirect Bilirubin: 0.8 mg/dL (ref 0.3–0.9)
Total Bilirubin: 0.9 mg/dL (ref 0.3–1.2)
Total Protein: 6.5 g/dL (ref 6.5–8.1)

## 2019-09-13 LAB — PROTIME-INR
INR: 2.6 — ABNORMAL HIGH (ref 0.8–1.2)
INR: 2.5 — ABNORMAL HIGH (ref 0.8–1.2)
Prothrombin Time: 27.9 seconds — ABNORMAL HIGH (ref 11.4–15.2)
Prothrombin Time: 26.6 seconds — ABNORMAL HIGH (ref 11.4–15.2)

## 2019-09-13 LAB — PHOSPHORUS: Phosphorus: 3.3 mg/dL (ref 2.5–4.6)

## 2019-09-13 LAB — GLUCOSE, CAPILLARY
Glucose-Capillary: 153 mg/dL — ABNORMAL HIGH (ref 70–99)
Glucose-Capillary: 217 mg/dL — ABNORMAL HIGH (ref 70–99)
Glucose-Capillary: 216 mg/dL — ABNORMAL HIGH (ref 70–99)
Glucose-Capillary: 157 mg/dL — ABNORMAL HIGH (ref 70–99)

## 2019-09-13 LAB — MAGNESIUM: Magnesium: 1.7 mg/dL (ref 1.7–2.4)

## 2019-09-13 MED ORDER — SODIUM CHLORIDE 0.9 % WEIGHT BASED INFUSION
3.0000 mL/kg/h | INTRAVENOUS | Status: DC
  Administered 2019-09-16: 3 mL/kg/h via INTRAVENOUS

## 2019-09-13 MED ORDER — SODIUM CHLORIDE 0.9 % IV SOLN: 250.0000 mL | INTRAVENOUS | Status: DC | PRN

## 2019-09-13 MED ORDER — SODIUM CHLORIDE 0.9% FLUSH: 3.0000 mL | INTRAVENOUS | Status: DC | PRN

## 2019-09-13 MED ORDER — SODIUM CHLORIDE 0.9% FLUSH
3.0000 mL | Freq: Two times a day (BID) | INTRAVENOUS | Status: DC
  Administered 2019-09-13 – 2019-09-16 (×7): 3 mL via INTRAVENOUS

## 2019-09-13 MED ORDER — SODIUM CHLORIDE 0.9 % WEIGHT BASED INFUSION
1.0000 mL/kg/h | INTRAVENOUS | Status: DC
  Administered 2019-09-16: 1 mL/kg/h via INTRAVENOUS

## 2019-09-13 MED ORDER — ASPIRIN 81 MG PO CHEW
81.0000 mg | CHEWABLE_TABLET | ORAL | Status: AC
  Administered 2019-09-16: 81 mg via ORAL
  Filled 2019-09-13: qty 1

## 2019-09-13 NOTE — Consult Note (Signed)
DENTAL CONSULTATION  Date of Consultation:  09/13/2019 Patient Name:   [name] Date of Birth:   [birth year] Medical Record Number: [id number]  COVID 19 SCREENING: The patient does not symptoms concerning for COVID-19 infection (Including fever, chills, cough, or new SHORTNESS OF BREATH).    VITALS: BP 123/74 (BP Location: Left Arm)   Pulse 79   Temp 99.6 F (37.6 C) (Oral)   Resp 19   Ht 6\' 2"  (1.88 m)   Wt 129.1 kg   SpO2 97%   BMI 36.54 kg/m   CHIEF COMPLAINT: Patient referred by Dr. Cyndia Bent for dental consultation.  HPI: Jimmye Maule is a 54 year old male recently diagnosed with endocarditis of the tricuspid valve.  Patient with anticipated tricuspid valve replacement or repair.  Patient is now seen as part of a medically necessary preheart valve surgery dental protocol.  Patient denies having any acute toothaches, swellings, or abscesses.  Patient has not seen a dentist and" a long time".  It has been over 20 years.  Patient denies having partial dentures.  Patient denies having dental phobia.  PROBLEM LIST: Patient Active Problem List   Diagnosis Date Noted  . Gram-positive cocci bacteremia 09/10/2019  . Endocarditis of tricuspid valve 09/10/2019  . Suspected endocarditis mechanical aortic valve 09/10/2019  . Nausea vomiting and diarrhea   . Chronic anticoagulation 04/02/2019  . Wellness examination 09/28/2018  . Lumbar disc herniation 06/06/2018  . CAD (coronary artery disease)   . Low back pain 03/07/2018  . Obesity (BMI 30-39.9) 01/15/2018  . Acute on chronic systolic congestive heart failure ([place])   . Status post coronary artery stent placement   . Shortness of breath 10/22/2017  . Normocytic anemia 08/31/2017  . Chronic diastolic CHF (congestive heart failure) ([place]) 03/30/2016  . Screen for colon cancer 09/03/2015  . PTSD (post-traumatic stress disorder) 10/31/2013  . Depressive disorder 10/31/2013  . Hallucinations 10/31/2013  . Cholelithiasis  08/22/2013  . Hypothyroidism 08/22/2013  . Encounter for therapeutic drug monitoring 08/07/2013  . Unstable angina ([place]) 07/25/2013  . CAD of artery bypass graft-  07/24/2013  . Hematoma of groin 07/24/2013  . Retroperitoneal bleed Nov 2014 07/24/2013  . Acute diastolic heart failure ([place]) 07/21/2013  . Ascending aortic dissection  in 2000- s/p Bentall and MDT tilting disc AVR   . PAF (paroxysmal atrial fibrillation) ([place])   . Bladder neck obstruction 05/04/2012  . Unspecified vitamin D deficiency 08/07/2008  . Gross hematuria 08/07/2008  . FATIGUE 12/26/2007  . Hyperlipidemia 08/20/2007  . Diabetes mellitus type II, non insulin dependent ([place]) 05/21/2007  . Essential hypertension 05/21/2007  . NEPHROLITHIASIS, HX OF 05/21/2007    PMH: Past Medical History:  Diagnosis Date  . Ascending aortic dissection ([place])    a. 2000 - with aortic valve involvement. S/p repair of aortic dissection with placement of mechanical AVR.  [name] CAD (coronary artery disease)    a. 2000 VG->PDA @ time of Ao dissection repair;  b. Cardiac CT 06/2010: no CAD, only mild plaque in prox RCA;  c. 2015 Cath: LM nl, LAD nl, LCX nl, RCA 100, VG->PDA 90 (4.5x12 Rebel BMS). d. 10/23/17 instent restenosis SVG to RCA, DES placed.  . Cholelithiasis 08/22/2013  . Chronic diastolic CHF (congestive heart failure) ([place])    a. 03/2016 Echo: EF 55-60%, no rwma, Gr2 DD.  . Diabetes mellitus   . Dyspnea   . Gross hematuria   . H/O mechanical aortic valve replacement    a. 03/2016 Echo: AoV mean gradient 36mmHg, Valve  Area (VTI) 1.36 cm^2, (Vmax) 1.22 cm^2, mod dil LA.  [name] Hyperlipidemia   . Hypertension   . NEPHROLITHIASIS, HX OF   . Obesity   . PAF (paroxysmal atrial fibrillation) ([place])    a. H/o such, with recurrence of coarse afib/flutter during ER consult 03/2012.  . Stroke ([place])   . TRANSIENT ISCHEMIC ATTACK, HX OF    a. In 2004.  [name] Unspecified vitamin D deficiency     PSH: Past Surgical History:  Procedure  Laterality Date  . AORTIC VALVE REPLACEMENT  2000  . APPENDECTOMY  1998  . CORONARY ARTERY BYPASS GRAFT  2000  . CORONARY STENT INTERVENTION N/A 10/23/2017   Procedure: CORONARY STENT INTERVENTION;  Surgeon: [name], MD;  Location: [facility];  Service: Cardiovascular;  Laterality: N/A;  . LEFT AND RIGHT HEART CATHETERIZATION WITH CORONARY ANGIOGRAM N/A 07/19/2013   Procedure: LEFT AND RIGHT HEART CATHETERIZATION WITH CORONARY ANGIOGRAM;  Surgeon: [name], MD;  Location: [facility] C[facility];  Service: Cardiovascular;  Laterality: N/A;  . LUMBAR LAMINECTOMY/DECOMPRESSION MICRODISCECTOMY Right 06/06/2018   Procedure: Right L3-4 disectomy;  Surgeon: Melina Schools, MD;  Location: [place];  Service: Orthopedics;  Laterality: Right;  2.5 hrs  . RIGHT HEART CATH AND CORONARY/GRAFT ANGIOGRAPHY N/A 10/23/2017   Procedure: RIGHT HEART CATH AND CORONARY/GRAFT ANGIOGRAPHY;  Surgeon: [name], MD;  Location: [place] [facility];  Service: Cardiovascular;  Laterality: N/A;  . TEE WITHOUT CARDIOVERSION N/A 07/22/2013   Procedure: TRANSESOPHAGEAL ECHOCARDIOGRAM (TEE);  Surgeon: [name], MD;  Location: [facility] ENDOSCOPY;  Service: Cardiovascular;  Laterality: N/A;    ALLERGIES: Allergies  Allergen Reactions  . Diltiazem Hives  . Insulin Glargine Swelling    edema    MEDICATIONS: Current Facility-Administered Medications  Medication Dose Route Frequency Provider Last Rate Last Admin  . 0.9 %  sodium chloride infusion  250 mL Intravenous PRN Ledora Bottcher, PA      . Derrill Memo ON 09/16/2019] 0.9% sodium chloride infusion  3 mL/kg/hr Intravenous Continuous [facility], Tami Lin, PA       Followed by  . [START ON 09/16/2019] 0.9% sodium chloride infusion  1 mL/kg/hr Intravenous Continuous [facility], Tami Lin, PA      . acetaminophen (TYLENOL) tablet 650 mg  650 mg Oral Q6H PRN Croitoru, Mihai, MD   650 mg at 09/11/19 0553   Or  . acetaminophen (TYLENOL) suppository 650 mg  650  mg Rectal Q6H PRN Croitoru, Mihai, MD      . Derrill Memo ON 09/16/2019] aspirin chewable tablet 81 mg  81 mg Oral Pre-Cath [facility], Tami Lin, [place]      . aspirin EC tablet 81 mg  81 mg Oral Daily Croitoru, Mihai, MD   81 mg at 09/13/19 0825  . atorvastatin (LIPITOR) tablet 40 mg  40 mg Oral Q breakfast Croitoru, Mihai, MD   40 mg at 09/13/19 0825  . cefTRIAXone (ROCEPHIN) 2 g in sodium chloride 0.9 % 100 mL IVPB  2 g Intravenous Q24H Croitoru, Mihai, MD 200 mL/hr at 09/13/19 1143 2 g at 09/13/19 1143  . gabapentin (NEURONTIN) capsule 300 mg  300 mg Oral Q breakfast Croitoru, Mihai, MD   300 mg at 09/13/19 0825  . gentamicin (GARAMYCIN) 300 mg in dextrose 5 % 50 mL IVPB  300 mg Intravenous Q24H Susa Raring, RPH 115 mL/hr at 09/12/19 2121 300 mg at 09/12/19 2121  . insulin aspart (novoLOG) injection 0-15 Units  0-15 Units Subcutaneous TID WC Croitoru, Mihai,  MD   3 Units at 09/13/19 0823  . insulin aspart (novoLOG) injection 0-5 Units  0-5 Units Subcutaneous QHS Croitoru, Mihai, MD   3 Units at 09/10/19 2223  . insulin aspart (novoLOG) injection 6 Units  6 Units Subcutaneous TID WC Croitoru, Mihai, MD      . insulin detemir (LEVEMIR) injection 6 Units  6 Units Subcutaneous BID Croitoru, Mihai, MD   6 Units at 09/13/19 0823  . levothyroxine (SYNTHROID) tablet 50 mcg  50 mcg Oral Q0600 Croitoru, Mihai, MD   50 mcg at 09/13/19 0624  . nitroGLYCERIN (NITROSTAT) SL tablet 0.4 mg  0.4 mg Sublingual Q5 Min x 3 PRN Croitoru, Mihai, MD      . ondansetron (ZOFRAN) tablet 4 mg  4 mg Oral Q6H PRN Croitoru, Mihai, MD       Or  . ondansetron (ZOFRAN) injection 4 mg  4 mg Intravenous Q6H PRN Croitoru, Mihai, MD      . polyethylene glycol (MIRALAX / GLYCOLAX) packet 17 g  17 g Oral BID Elodia Florence., MD   17 g at 09/13/19 [id number]  . sodium chloride flush (NS) 0.9 % injection 3 mL  3 mL Intravenous Once Croitoru, Mihai, MD      . sodium chloride flush (NS) 0.9 % injection 3 mL  3 mL Intravenous Q12H Ledora Bottcher, PA   3 mL at 09/13/19 0828  . sodium chloride flush (NS) 0.9 % injection 3 mL  3 mL Intravenous PRN Ledora Bottcher, PA        LABS: Lab Results  Component Value Date   WBC 7.7 09/13/2019   HGB 8.9 (L) 09/13/2019   HCT 25.4 (L) 09/13/2019   MCV 80.9 09/13/2019   PLT 200 09/13/2019      Component Value Date/Time   NA 138 09/13/2019 0424   K 3.7 09/13/2019 0424   CL 107 09/13/2019 0424   CO2 23 09/13/2019 0424   GLUCOSE 160 (H) 09/13/2019 0424   BUN 13 09/13/2019 0424   CREATININE 1.01 09/13/2019 0424   CREATININE 1.14 06/15/2016 0946   CALCIUM 8.4 (L) 09/13/2019 0424   GFRNONAA >60 09/13/2019 0424   GFRAA >60 09/13/2019 0424   Lab Results  Component Value Date   INR 2.6 (H) 09/13/2019   INR 3.0 (H) 09/12/2019   INR 3.2 (H) 09/11/2019   PROTIME 15.9 11/06/2008   PROTIME 15.6 10/23/2008   No results found for: PTT  SOCIAL HISTORY: Social History   Socioeconomic History  . Marital status: Married    Spouse name: Not on file  . Number of children: 4  . Years of education: Not on file  . Highest education level: Not on file  Occupational History  . Occupation: DISABLED    Employer: UNEMPLOYED  Tobacco Use  . Smoking status: Never Smoker  . Smokeless tobacco: Never Used  Substance and Sexual Activity  . Alcohol use: No    Alcohol/week: 0.0 standard drinks  . Drug use: No  . Sexual activity: Not on file  Other Topics Concern  . Not on file  Social History Narrative  . Not on file   Social Determinants of Health   Financial Resource Strain:   . Difficulty of Paying Living Expenses:   Food Insecurity:   . Worried About Charity fundraiser in the Last Year:   . Arboriculturist in the Last Year:   Transportation Needs:   . Film/video editor (Medical):   [name]  Lack of Transportation (Non-Medical):   Physical Activity:   . Days of Exercise per Week:   . Minutes of Exercise per Session:   Stress:   . Feeling of Stress :   Social  Connections:   . Frequency of Communication with Friends and Family:   . Frequency of Social Gatherings with Friends and Family:   . Attends Religious Services:   . Active Member of Clubs or Organizations:   . Attends Archivist Meetings:   [name] Marital Status:   Intimate Partner Violence:   . Fear of Current or Ex-Partner:   . Emotionally Abused:   [name] Physically Abused:   . Sexually Abused:     FAMILY HISTORY: Family History  Problem Relation Age of Onset  . Coronary artery disease Mother   . Hypertension Mother   . Diabetes Brother   . Coronary artery disease Other 59       male 1st degree relative, CABG in his 35's (father)  . Cancer Father        ? lung    REVIEW OF SYSTEMS: Reviewed with the patient as per History of present illness. Psych: Patient denies having dental phobia.  DENTAL HISTORY: CHIEF COMPLAINT: Patient referred by Dr. Cyndia Bent for dental consultation.  HPI: [name] is a 53 year old male recently diagnosed with endocarditis of the tricuspid valve.  Patient with anticipated tricuspid valve replacement or repair.  Patient is now seen as part of a medically necessary preheart valve surgery dental protocol.  Patient denies having any acute toothaches, swellings, or abscesses.  Patient has not seen a dentist and" a long time".  It has been over 20 years.  Patient denies having partial dentures.  Patient denies having dental phobia.  DENTAL EXAMINATION: GENERAL: The patient is a well-developed, well-nourished male no acute distress. HEAD AND NECK: There is no palpable neck lymphadenopathy.  The patient denies acute TMJ symptoms. INTRAORAL EXAM: Patient has normal saliva.  There is no evidence of oral abscess formation. DENTITION: Patient is missing tooth numbers 2, 3, 14, 15, 17, 18, and 32.  There are retained root segments in the area of tooth numbers 1 and 19. PERIODONTAL: Patient has chronic periodontitis with plaque calculus accumulations,  gingival recession, and incipient to moderate bone loss. DENTAL CARIES/SUBOPTIMAL RESTORATIONS: Multiple dental caries are noted. ENDODONTIC: Patient currently denies acute pulpitis symptoms.  There are multiple areas of periapical pathology and radiolucency associated with tooth numbers 1 and 19. CROWN AND BRIDGE: There are no crown or bridge restorations. PROSTHODONTIC: Patient denies having partial dentures. OCCLUSION: Patient has a poor occlusal scheme secondary to multiple missing teeth, multiple diastemas, multiple retained root segments, lack of replacement of the missing teeth with dental prostheses.  RADIOGRAPHIC INTERPRETATION: An orthopantogram was taken on 09/11/2019. There are multiple missing teeth.  There are multiple retained root segments.  There are multiple areas of periapical pathology and radiolucency.  Multiple dental caries are noted.  There is supra eruption drifting of the unopposed teeth into the edentulous areas.  Multiple diastemas are noted.   ASSESSMENTS: 1.  Tricuspid valve endocarditis 2.  Preheart valve surgery dental protocol 3.  History of chronic anticoagulation 4.  Risk for bleeding with invasive dental procedures 5.  Chronic apical periodontitis 6.  Multiple retained root segments 7.  Dental caries 8.  Chronic periodontitis with bone loss 9.  Gingival recession 10.  Accretions 11.  Multiple missing teeth 12.  Supra eruption and drifting of the unopposed teeth into the edentulous areas  13.  Poor occlusal scheme and malocclusion 14.  Need for antibiotic premedication prior to invasive dental procedure due to previous Bentall procedure.   PLAN/RECOMMENDATIONS: 1. I discussed the risks, benefits, and complications of various treatment options with the patient in relationship to his medical and dental conditions, tricuspid valve endocarditis, and risk for future endocarditis. We discussed various treatment options to include no treatment, multiple  extractions with alveoloplasty, pre-prosthetic surgery as indicated, periodontal therapy, dental restorations, root canal therapy, crown and bridge therapy, implant therapy, and replacement of missing teeth as indicated. The patient currently wishes to proceed with multiple extractions with alveoloplasty and gross debridement of remaining dentition in the operating room with general anesthesia.  The heparin therapy will be discontinued 6 hours prior to the invasive dental procedures and restarted approximately 12 hours after the dental extraction procedure has been completed.  The operating room procedure has been scheduled for Tuesday, 09/17/2019 at 7:30 AM at [facility].  Patient understands that tooth numbers 1 and 19 will be removed but will have other teeth considered for extraction upon further examination in the operating room under general anesthesia. Dr. Cyndia Bent will then proceed with heart valve surgery at his discretion.  Patient will then follow-up with a general dentist with his choice for continued comprehensive dental care as indicated once medically stable from the anticipated heart valve surgery.  Patient will require antibiotic premedication prior to invasive dental procedures after the heart valve surgery per American Heart Association guidelines.     2. Discussion of findings with medical team and coordination of future medical and dental care as needed.    Lenn Cal, DDS

## 2019-09-13 NOTE — Progress Notes (Signed)
Discussed sternal precautions, IS (750 mL), mobility post op and d/c planning. Pt and wife receptive, not many questions since they have been through [facility] before. Gave them materials to read and encouraged IS use. He is mobilizing in room and could walk hall independently. [place] 3:03 PM 09/13/2019

## 2019-09-13 NOTE — Progress Notes (Signed)
Patient ID: [name], male   DOB: 1966/05/16, 54 y.o.   MRN: [id number] TCTS:  CTA chest and MRI reviewed. There is an aggressive appearing destructive lesion in T10 vertebral body extending into the right paravertebral tissues and head of right 10th rib. It is not clear what this is but it is very concerning. Given his diagnosis of endocarditis I would have to be concerned about the possibility of vertebral osteomyelitis and phlegmon although it could be a metastatic lesion or primary bone tumor. I would have neurosurgery evaluate and consider biopsy of this at their discretion.

## 2019-09-13 NOTE — Progress Notes (Addendum)
Progress Note  Patient Name: [name] Date of Encounter: 09/13/2019  Primary Cardiologist: Kirk Ruths, MD   Subjective   Pt reported 4 months of mid right back pain. No chest pain, no dyspnea or lower extremity edema.  Inpatient Medications    Scheduled Meds: . [START ON 09/16/2019] aspirin  81 mg Oral Pre-Cath  . aspirin EC  81 mg Oral Daily  . atorvastatin  40 mg Oral Q breakfast  . gabapentin  300 mg Oral Q breakfast  . insulin aspart  0-15 Units Subcutaneous TID WC  . insulin aspart  0-5 Units Subcutaneous QHS  . insulin aspart  6 Units Subcutaneous TID WC  . insulin detemir  6 Units Subcutaneous BID  . levothyroxine  50 mcg Oral Q0600  . polyethylene glycol  17 g Oral BID  . sodium chloride flush  3 mL Intravenous Once  . sodium chloride flush  3 mL Intravenous Q12H   Continuous Infusions: . sodium chloride    . [START ON 09/16/2019] sodium chloride     Followed by  . [START ON 09/16/2019] sodium chloride    . cefTRIAXone (ROCEPHIN)  IV 2 g (09/12/19 1623)  . gentamicin 300 mg (09/12/19 2121)   PRN Meds: sodium chloride, acetaminophen **OR** acetaminophen, nitroGLYCERIN, ondansetron **OR** ondansetron (ZOFRAN) IV, sodium chloride flush   Vital Signs    Vitals:   09/12/19 1503 09/12/19 2055 09/13/19 0330 09/13/19 0500  BP: 117/72 125/77 123/74   Pulse: 75 81 79   Resp: 20 17 19    Temp: 99.6 F (37.6 C) 98.5 F (36.9 C) 99.6 F (37.6 C)   TempSrc: Oral Oral Oral   SpO2: 98% 100% 97%   Weight:    129.1 kg  Height:        Intake/Output Summary (Last 24 hours) at 09/13/2019 0753 Last data filed at 09/12/2019 2304 Gross per 24 hour  Intake 820 ml  Output 1000 ml  Net -180 ml   Last 3 Weights 09/13/2019 09/12/2019 09/11/2019  Weight (lbs) 284 lb 9.6 oz 281 lb 279 lb 15.8 oz  Weight (kg) 129.094 kg 127.461 kg 127 kg      Telemetry    Appears to be sinus rhythm with a long PR, HR in the 70s - Personally Reviewed  ECG    No new tracings -  Personally Reviewed  Physical Exam   GEN: No acute distress.   Neck: No JVD Cardiac: RRR, valve click.  Respiratory: Clear to auscultation bilaterally. GI: Soft, nontender, non-distended  MS: No edema; No deformity. Neuro:  Nonfocal  Psych: Normal affect   Labs    High Sensitivity Troponin:   Recent Labs  Lab 09/08/19 0810 09/08/19 1100  TROPONINIHS 34* 28*      Chemistry Recent Labs  Lab 09/09/19 0406 09/09/19 0406 09/10/19 0448 09/10/19 0448 09/11/19 0508 09/12/19 0535 09/13/19 0424  NA 138   < > 137   < > 137 137 138  K 3.9   < > 3.5   < > 3.3* 3.7 3.7  CL 104   < > 104   < > 108 107 107  CO2 24   < > 23   < > 22 22 23   GLUCOSE 187*   < > 181*   < > 203* 199* 160*  BUN 13   < > 16   < > 15 13 13   CREATININE 1.11   < > 1.50*   < > 1.27* 1.17 1.01  CALCIUM 8.9   < >  8.6*   < > 8.4* 8.7* 8.4*  PROT 7.2  --   --   --   --   --  6.5  ALBUMIN 2.5*  --  2.6*  --   --   --  2.3*  AST 23  --   --   --   --   --  19  ALT 15  --   --   --   --   --  12  ALKPHOS 80  --   --   --   --   --  64  BILITOT 1.1  --   --   --   --   --  0.9  GFRNONAA >60   < > 52*   < > >60 >60 >60  GFRAA >60   < > >60   < > >60 >60 >60  ANIONGAP 10   < > 10   < > 7 8 8    < > = values in this interval not displayed.     Hematology Recent Labs  Lab 09/10/19 0448 09/12/19 0535 09/13/19 0424  WBC 9.4 10.7* 7.7  RBC 3.68* 3.69* 3.14*  HGB 10.5* 10.6* 8.9*  HCT 29.7* 29.7* 25.4*  MCV 80.7 80.5 80.9  MCH 28.5 28.7 28.3  MCHC 35.4 35.7 35.0  RDW 14.2 14.2 14.2  PLT 213 252 200    BNP Recent Labs  Lab 09/08/19 1100  BNP 535.2*     DDimer No results for input(s): DDIMER in the last 168 hours.   Radiology    DG Orthopantogram  Result Date: 09/11/2019 CLINICAL DATA:  Pre-op testing. Molar pain. EXAM: ORTHOPANTOGRAM/PANORAMIC COMPARISON:  None. FINDINGS: Several teeth are absent. There is typical midline artifact of the Panorex technique. Significant dental caries involving the  right maxillary wisdom tooth and the most posterior remaining left mandibular molar. That tooth also demonstrates significant periodontal disease. No evidence of acute fracture or dislocation. IMPRESSION: Dental caries and periodontal disease as described. Electronically Signed   By: Richardean Sale M.D.   On: 09/11/2019 18:04   MR THORACIC SPINE W WO CONTRAST  Result Date: 09/12/2019 CLINICAL DATA:  Bone lesion EXAM: MRI THORACIC WITHOUT AND WITH CONTRAST TECHNIQUE: Multiplanar and multiecho pulse sequences of the thoracic spine were obtained without and with intravenous contrast. CONTRAST:  56mL GADAVIST GADOBUTROL 1 MMOL/ML IV SOLN COMPARISON:  CTA chest/abdomen/pelvis 09/12/2019 FINDINGS: MRI THORACIC SPINE FINDINGS The examination is intermittently motion degraded. No scout localizer imaging is provided. Spinal numbering is ascertained by numbering caudally from the C2 level on same-day CTA chest/abdomen/pelvis. Alignment: No significant spondylolisthesis. Vertebrae: There is mild loss of T10 vertebral body height. Vertebral body height is otherwise maintained. There is prominent STIR hyperintensity and enhancement within the central and right aspect of the right T10 vertebral body also extending partly into the right T10 pedicle and transverse process. The mass extends into the right paravertebral soft tissues, with this component measuring 2.1 x 1.7 x 2.6 cm (AP x TV x CC) (series 15, image 54) (series 7, image 2). As demonstrated on CT performed earlier the same day, there is involvement of the head of the right tenth rib. The T10 lesion is slightly expansile and there is a convex posterior vertebral margin. There are multiple additional subcentimeter foci of enhancement within the thoracic spine which are indeterminate. These are present within the T7, T8 and T12 vertebrae. Cord: No spinal cord signal abnormality or abnormal cord enhancement. No abnormal enhancement is identified within  the spinal  canal. Paraspinal and other soft tissues: Right paraspinal extension of a T10 vertebral body mass as described above. The paraspinal soft tissues are otherwise unremarkable. Disc levels: No more than mild disc degeneration at any level. At the T10 level, bony expansion and facet hypertrophy contribute to severe spinal canal stenosis with near complete effacement of the thecal sac. There may be mild cord impingement. Bony expansion and facet arthrosis also contributes to moderate/severe right neural foraminal narrowing at T10-T11. At the remaining thoracic levels, there is no focal disc herniation. There is multilevel facet arthrosis and ligamentum flavum hypertrophy which is greatest in the lower thoracic spine. No more than mild spinal canal narrowing at these remaining levels. No significant foraminal narrowing at the remaining levels. IMPRESSION: Again demonstrated is an aggressive appearing mass centered within the T10 vertebral body also extending into the right T10 pedicle and articular pillar. There is right paraspinal extension of this mass with this component measuring 2.6 cm and involving the head of the right tenth rib. There is mild loss of T10 vertebral body height. Primary differential considerations include plasmacytoma, metastatic disease, osteoblastoma or other aggressive primary bone lesion. Consider direct tissue sampling. Posterior bony expansion and facet hypertrophy contribute to severe spinal canal stenosis at the T10 level with near complete effacement of the thecal sac. Moderate/severe right T10-T11 neural foraminal narrowing. There are subcentimeter foci of enhancement within the T7, T8 and T12 vertebrae which are indeterminate. Electronically Signed   By: Kellie Simmering DO   On: 09/12/2019 20:42   CT ANGIO CHEST AORTA W/CM & OR WO/CM  Result Date: 09/12/2019 CLINICAL DATA:  54 year old male under preoperative evaluation prior to potential redo Bentall procedure. EXAM: CT ANGIOGRAPHY  CHEST, ABDOMEN AND PELVIS TECHNIQUE: Non-contrast CT of the chest was initially obtained. Multidetector CT imaging through the chest, abdomen and pelvis was performed using the standard protocol during bolus administration of intravenous contrast. Multiplanar reconstructed images and MIPs were obtained and reviewed to evaluate the vascular anatomy. CONTRAST:  132mL OMNIPAQUE IOHEXOL 350 MG/ML SOLN COMPARISON:  Chest CTA 10/09/2014. CTA of the chest, abdomen and pelvis 07/24/2013. FINDINGS: CTA CHEST FINDINGS Cardiovascular: Heart size is enlarged. There is no significant pericardial fluid, thickening or pericardial calcification. 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 There is a small defect on the undersurface of the mechanical aortic valve best appreciated on coronal image 82 of series 8 measuring approximately 8 mm, which may represent valve associated thrombus and/or vegetation. The aortic arch is mildly aneurysmal measuring up to 4.3 cm in diameter. There is a residual dissection flap in the proximal and mid aortic arch which does not propagate into the great vessels. Descending thoracic aorta is normal in caliber measuring 2.8 cm in diameter. Bovine type thoracic aortic arch incidentally noted. There is a right coronary artery bypass graft. Large filling defect in the superior aspect of the right atrium immediately posterior to the septal leaflet of the tricuspid valve, and intimately associated with the adjacent aortic root, estimated to measure approximately 2.2 x 2.2 x 1.7 cm (axial image 83 of series 6 and coronal image 73 of series 8), presumably a vegetation. Mediastinum/Nodes: No pathologically enlarged mediastinal or hilar lymph nodes. Esophagus is unremarkable in appearance. No axillary lymphadenopathy. Lungs/Pleura: No suspicious pulmonary nodules or masses are noted. No acute consolidative airspace disease. No pleural  effusions. Musculoskeletal: In the T10 vertebral body there is a large lucent lesion with extension of  soft tissue into the right paravertebral region and involvement of the head of the right tenth rib overall estimated to measure approximately 4.9 x 3.4 x 2.0 cm (axial image 105 of series 6 and sagittal image 117 of series 9). This causes significant narrowing of the central spinal canal (approximately 5 mm AP) at this level. Median sternotomy wires. Review of the MIP images confirms the above findings. CTA ABDOMEN AND PELVIS FINDINGS VASCULAR Aorta: Mild aortic atherosclerosis. Normal caliber aorta without aneurysm, dissection, vasculitis or significant stenosis. Celiac: Patent without evidence of aneurysm, dissection, vasculitis or significant stenosis. SMA: Patent without evidence of aneurysm, dissection, vasculitis or significant stenosis. Renals: Both renal arteries are patent without evidence of aneurysm, dissection, vasculitis, fibromuscular dysplasia or significant stenosis. IMA: Patent without evidence of aneurysm, dissection, vasculitis or significant stenosis. Inflow: Patent without evidence of aneurysm, dissection, vasculitis or significant stenosis. Veins: No obvious venous abnormality within the limitations of this arterial phase study. Review of the MIP images confirms the above findings. NON-VASCULAR Hepatobiliary: No suspicious appearing cystic or solid hepatic lesions are confidently identified on today's arterial phase examination. No intra or extrahepatic biliary ductal dilatation. Several partially calcified gallstones are noted measuring up to 1.6 cm in diameter. Gallbladder is otherwise unremarkable in appearance. Pancreas: No pancreatic mass. No pancreatic ductal dilatation. No pancreatic or peripancreatic fluid collections or inflammatory changes. Spleen: Unremarkable. Adrenals/Urinary Tract: Bilateral kidneys and adrenal glands are normal in appearance. No hydroureteronephrosis. Urinary  bladder is normal in appearance. Stomach/Bowel: Normal appearance of the stomach. No pathologic dilatation of small bowel or colon. The appendix is not confidently identified and may be surgically absent. Regardless, there are no inflammatory changes noted adjacent to the cecum to suggest the presence of an acute appendicitis at this time. Lymphatic: No lymphadenopathy noted in the abdomen or pelvis. Reproductive: Prostate gland and seminal vesicles are unremarkable in appearance. Other: No significant volume of ascites.  No pneumoperitoneum. Musculoskeletal: There are no aggressive appearing lytic or blastic lesions noted in the visualized portions of the skeleton. Review of the MIP images confirms the above findings. IMPRESSION: 1. 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 2. Small lesions associated with the undersurface of the mechanical aortic valve and the superior aspect of the septal leaflet of the tricuspid valve, which may reflect valve associated thrombus and/or vegetations, as detailed above. 3. Aggressive appearing lesion in T10 vertebral body with extends in into the right paravertebral soft tissues and involvement of the head of the right tenth rib, with marked narrowing of the central spinal canal at this level, as detailed above. Primary differential considerations include a plasmacytoma, or less likely and osteoblastoma or solitary metastatic lesion (no primary lesion is confidently identified). If there is clinical concern for spinal cord compression, the full extent of this lesion could be better evaluated with thoracic spine MRI with and without IV gadolinium. 4. Bovine type thoracic aortic arch (normal anatomical variant) incidentally noted. 5. Cholelithiasis without evidence of acute cholecystitis at this time. 6. Additional  incidental findings, as above. These results will be called to the ordering clinician or representative by the Radiologist Assistant, and communication documented in the PACS or [employer]. Electronically Signed   By: Vinnie Langton M.D.   On: 09/12/2019 13:02   ECHO TEE  Result Date: 09/11/2019    TRANSESOPHOGEAL ECHO REPORT   Patient Name:   JAYLAND SWARTOUT  Date of Exam: 09/11/2019 Medical Rec #:  [id number]        Height:       74.0 in Accession #:    [id number]       Weight:       280.0 lb Date of Birth:  19-Feb-1966        BSA:          2.508 m Patient Age:    57 years         BP:           108/65 mmHg Patient Gender: M                HR:           106 bpm. Exam Location:  Inpatient Procedure: Transesophageal Echo, Cardiac Doppler and Color Doppler Indications:     Bacteremia.  History:         Patient has prior history of Echocardiogram examinations, most                  recent 09/09/2019. CAD, Aortic Valve Disease, Endocarditis,                  Tricuspid vegetation and Prosthetic Valve Complications,                  Signs/Symptoms:Altered Mental Status; Risk Factors:Diabetes,                  Hypertension and Dyslipidemia. Aortic valve replacement.                  Bentall procedure. Aortic dissection.                  Aortic Valve: unknown Medtronic tilting disk valve is present                  in the aortic position. Procedure Date: 2000.  Sonographer:     Roseanna Rainbow RDCS Referring Phys:  [id number] RHONDA G BARRETT Diagnosing Phys: Sanda Klein MD PROCEDURE: After discussion of the risks and benefits of a TEE, an informed consent was obtained from the patient. The transesophogeal probe was passed without difficulty through the esophogus of the patient. Imaged were obtained with the patient in a left lateral decubitus position. Sedation performed by different physician. The patient was monitored while under deep sedation. Anesthestetic sedation was provided intravenously by Anesthesiology: 230mg  of  Propofol. The patient's vital signs; including heart rate, blood pressure, and oxygen saturation; remained stable throughout the procedure. The patient developed no complications during the procedure. IMPRESSIONS  1. Left ventricular ejection fraction, by estimation, is 55 to 60%. The left ventricle has normal function. The left ventricle has no regional wall motion abnormalities. There is mild concentric left ventricular hypertrophy.  2. Right ventricular systolic function is normal. The right ventricular size is mildly enlarged. There is mildly elevated pulmonary artery systolic pressure. The estimated right ventricular systolic pressure is [id number] mmHg.  3. It appears that the left atrial appendage has been surgically resected or oversown. Left atrial size was severely dilated. No left atrial/left atrial appendage thrombus was detected. The LAA emptying velocity was 35 cm/s.  4. Right atrial size was severely dilated.  5. The mitral valve is normal in structure. Mild mitral valve regurgitation.  6. There is a large, moderately mobile vegetation attached to the septal leaflet of the tricuspid valve, measuring 27 mm in length and 11 mm in width. It appears heterogeneous and friable.. Tricuspid  valve regurgitation is moderate.  7. There is a large vegetation attached to the medial aspect of posterior disc or the posteromedial mechanical valve annulus. It is highly mobile and prolapses readily across the valve orifice. It measures 15 mm in length and 6 mm in width. Although there is no evidence of a periannular abscess cavity, the periannular tissue echodensity is heterogeneous, raining concern for inflammation/early abscess formation. The proximity of the aortic and the tricuspid vegetation to the same area of the interventricular septum also raises concern for intramyocardial abscess or fistula formation. The aortic valve has been repaired/replaced. Aortic valve regurgitation is trivial. Mild aortic valve stenosis.  There is a unknown Medtronic tilting disk valve present in the aortic position. Procedure Date: 2000. Aortic valve mean gradient measures 20.0 mmHg.  8. There is mild (Grade II) atheroma plaque involving the transverse aorta. Comparison(s): Compared to previous reports, there is evidence of vegetations on the tricuspid valve and the mechanical aortic valve and suspicion for early perivalvular abscess. The dimensionless aortic vale index is similar to older studies. This suggests that the increased aortic valve gradients are due to increased cardiac output (rather than obstruction from the vegetation or compromised prosthetic disc motion). FINDINGS  Left Ventricle: Left ventricular ejection fraction, by estimation, is 55 to 60%. The left ventricle has normal function. The left ventricle has no regional wall motion abnormalities. The left ventricular internal cavity size was normal in size. There is  mild concentric left ventricular hypertrophy. Abnormal (paradoxical) septal motion consistent with post-operative status. Right Ventricle: The right ventricular size is mildly enlarged. No increase in right ventricular wall thickness. Right ventricular systolic function is normal. There is mildly elevated pulmonary artery systolic pressure. The tricuspid regurgitant velocity is 2.48 m/s, and with an assumed right atrial pressure of 8 mmHg, the estimated right ventricular systolic pressure is [id number] mmHg. Left Atrium: It appears that the left atrial appendage has been surgically resected or oversown. Left atrial size was severely dilated. Spontaneous echo contrast was present in the left atrium. No left atrial/left atrial appendage thrombus was detected. The LAA emptying velocity was 35 cm/s. Right Atrium: Right atrial size was severely dilated. Prominent Eustachian valve. Pericardium: There is no evidence of pericardial effusion. Mitral Valve: The mitral valve is normal in structure. Mild mitral valve regurgitation, with  centrally-directed jet. Tricuspid Valve: There is a large, moderately mobile vegetation attached to the septal leaflet of the tricuspid valve, measuring 27 mm in length and 11 mm in width. It appears heterogeneous and friable. The tricuspid valve is normal in structure. Tricuspid valve regurgitation is moderate. Aortic Valve: There is a large vegetation attached to the medial aspect of posterior disc or the posteromedial mechanical valve annulus. It is highly mobile and prolapses readily across the valve orifice. It measures 15 mm in length and 6 mm in width. Although there is no evidence of a periannular abscess cavity, the periannular tissue echodensity is heterogeneous, raining concern for inflammation/early abscess formation. The proximity of the aortic and the tricuspid vegetation to the same area of the  interventricular septum also raises concern for intramyocardial abscess or fistula formation. The aortic valve has been repaired/replaced. Aortic valve regurgitation is trivial. Mild aortic stenosis is present. Aortic valve mean gradient measures 20.0 mmHg. Aortic valve peak gradient measures 32.9 mmHg. There is a unknown Medtronic tilting disk valve present in the aortic position. Procedure Date: 2000. Pulmonic Valve: The pulmonic valve was normal in structure. Pulmonic valve regurgitation is trivial. Aorta: The aortic root,  ascending aorta, aortic arch and descending aorta are all structurally normal, with no evidence of dilitation or obstruction. There is mild (Grade II) atheroma plaque involving the transverse aorta. IAS/Shunts: No atrial level shunt detected by color flow Doppler.  AORTIC VALVE AV Vmax:           287.00 cm/s AV Vmean:          208.000 cm/s AV VTI:            0.444 m AV Peak Grad:      32.9 mmHg AV Mean Grad:      20.0 mmHg LVOT Vmax:         126.00 cm/s LVOT Vmean:        90.500 cm/s LVOT VTI:          0.223 m LVOT/AV VTI ratio: 0.50 TRICUSPID VALVE TR Peak grad:   24.6 mmHg TR Vmax:         248.00 cm/s  SHUNTS Systemic VTI: 0.22 m Dani Gobble Croitoru MD Electronically signed by Sanda Klein MD Signature Date/Time: 09/11/2019/2:35:26 PM    Final    VAS US DOPPLER PRE CABG  Result Date: 09/12/2019 PREOPERATIVE VASCULAR EVALUATION  Indications:      Pre-CABG. Risk Factors:     Hypertension, hyperlipidemia, Diabetes. Comparison Study: No prior studies. Performing Technologist: Carlos Levering Rvt  Examination Guidelines: A complete evaluation includes B-mode imaging, spectral Doppler, color Doppler, and power Doppler as needed of all accessible portions of each vessel. Bilateral testing is considered an integral part of a complete examination. Limited examinations for reoccurring indications may be performed as noted.  Right Carotid Findings: +----------+--------+--------+--------+-----------------------+--------+           PSV cm/sEDV cm/sStenosisDescribe               Comments +----------+--------+--------+--------+-----------------------+--------+ CCA Prox  157     16              smooth and heterogenoustortuous +----------+--------+--------+--------+-----------------------+--------+ CCA Distal46      13              smooth and heterogenous         +----------+--------+--------+--------+-----------------------+--------+ ICA Prox  50      16              smooth and heterogenous         +----------+--------+--------+--------+-----------------------+--------+ ICA Distal42      18                                     tortuous +----------+--------+--------+--------+-----------------------+--------+ ECA       74      8                                               +----------+--------+--------+--------+-----------------------+--------+ Portions of this table do not appear on this page. +----------+--------+-------+--------+------------+           PSV cm/sEDV cmsDescribeArm Pressure +----------+--------+-------+--------+------------+ Subclavian81                      134          +----------+--------+-------+--------+------------+ +---------+--------+--+--------+--+---------+ VertebralPSV cm/s33EDV cm/s10Antegrade +---------+--------+--+--------+--+---------+ Left Carotid Findings: +----------+--------+--------+--------+-----------------------+--------+           PSV cm/sEDV cm/sStenosisDescribe  Comments +----------+--------+--------+--------+-----------------------+--------+ CCA Prox  65      16              smooth and heterogenous         +----------+--------+--------+--------+-----------------------+--------+ CCA Distal62      9               smooth and heterogenous         +----------+--------+--------+--------+-----------------------+--------+ ICA Prox  40      17                                     tortuous +----------+--------+--------+--------+-----------------------+--------+ ICA Distal56      29                                     tortuous +----------+--------+--------+--------+-----------------------+--------+ ECA       45      6                                               +----------+--------+--------+--------+-----------------------+--------+ +----------+--------+--------+--------+------------+ SubclavianPSV cm/sEDV cm/sDescribeArm Pressure +----------+--------+--------+--------+------------+           71                      136          +----------+--------+--------+--------+------------+ +---------+--------+--+--------+-+---------+ VertebralPSV cm/s38EDV cm/s9Antegrade +---------+--------+--+--------+-+---------+  ABI Findings: +--------+------------------+-----+---------+--------+ Right   Rt Pressure (mmHg)IndexWaveform Comment  +--------+------------------+-----+---------+--------+ [id number]                    triphasic         +--------+------------------+-----+---------+--------+ PTA     147               1.08 triphasic          +--------+------------------+-----+---------+--------+ DP      176               1.29 triphasic         +--------+------------------+-----+---------+--------+ +--------+------------------+-----+---------+-------+ Left    Lt Pressure (mmHg)IndexWaveform Comment +--------+------------------+-----+---------+-------+ [id number]                    triphasic        +--------+------------------+-----+---------+-------+ PTA     122               0.90 triphasic        +--------+------------------+-----+---------+-------+ DP      155               1.14 triphasic        +--------+------------------+-----+---------+-------+ +-------+---------------+----------------+ ABI/TBIToday's ABI/TBIPrevious ABI/TBI +-------+---------------+----------------+ Right  1.29                            +-------+---------------+----------------+ Left   1.14                            +-------+---------------+----------------+  Right Doppler Findings: +--------+--------+-----+---------+--------+ Site    PressureIndexDoppler  Comments +--------+--------+-----+---------+--------+ [id number]          triphasic         +--------+--------+-----+---------+--------+ Radial  triphasic         +--------+--------+-----+---------+--------+ Ulnar                triphasic         +--------+--------+-----+---------+--------+  Left Doppler Findings: +--------+--------+-----+---------+--------+ Site    PressureIndexDoppler  Comments +--------+--------+-----+---------+--------+ [id number]          triphasic         +--------+--------+-----+---------+--------+ Radial               triphasic         +--------+--------+-----+---------+--------+ Ulnar                triphasic         +--------+--------+-----+---------+--------+  Summary: Right Carotid: Velocities in the right ICA are consistent with a 1-39% stenosis. Left Carotid: Velocities in the left ICA are  consistent with a 1-39% stenosis. Vertebrals: Bilateral vertebral arteries demonstrate antegrade flow. Right ABI: Resting right ankle-brachial index is within normal range. No evidence of significant right lower extremity arterial disease. Left ABI: Resting left ankle-brachial index is within normal range. No evidence of significant left lower extremity arterial disease. Right Upper Extremity: Doppler waveforms decrease >50% with right radial compression. Doppler waveform obliterate with right ulnar compression. Left Upper Extremity: Doppler waveform obliterate with left radial compression. Doppler waveforms remain within normal limits with left ulnar compression.  Electronically signed by Monica Martinez MD on 09/12/2019 at 8:06:35 PM.    Final    CT Angio Abd/Pel w/ and/or w/o  Result Date: 09/12/2019 CLINICAL DATA:  54 year old male under preoperative evaluation prior to potential redo Bentall procedure. EXAM: CT ANGIOGRAPHY CHEST, ABDOMEN AND PELVIS TECHNIQUE: Non-contrast CT of the chest was initially obtained. Multidetector CT imaging through the chest, abdomen and pelvis was performed using the standard protocol during bolus administration of intravenous contrast. Multiplanar reconstructed images and MIPs were obtained and reviewed to evaluate the vascular anatomy. CONTRAST:  170mL OMNIPAQUE IOHEXOL 350 MG/ML SOLN COMPARISON:  Chest CTA 10/09/2014. CTA of the chest, abdomen and pelvis 07/24/2013. FINDINGS: CTA CHEST FINDINGS Cardiovascular: Heart size is enlarged. There is no significant pericardial fluid, thickening or pericardial calcification. 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 There is a small defect on the undersurface of the mechanical aortic valve best appreciated on coronal image 82 of series 8 measuring approximately 8 mm, which may represent valve associated thrombus and/or vegetation. The aortic arch is mildly  aneurysmal measuring up to 4.3 cm in diameter. There is a residual dissection flap in the proximal and mid aortic arch which does not propagate into the great vessels. Descending thoracic aorta is normal in caliber measuring 2.8 cm in diameter. Bovine type thoracic aortic arch incidentally noted. There is a right coronary artery bypass graft. Large filling defect in the superior aspect of the right atrium immediately posterior to the septal leaflet of the tricuspid valve, and intimately associated with the adjacent aortic root, estimated to measure approximately 2.2 x 2.2 x 1.7 cm (axial image 83 of series 6 and coronal image 73 of series 8), presumably a vegetation. Mediastinum/Nodes: No pathologically enlarged mediastinal or hilar lymph nodes. Esophagus is unremarkable in appearance. No axillary lymphadenopathy. Lungs/Pleura: No suspicious pulmonary nodules or masses are noted. No acute consolidative airspace disease. No pleural effusions. Musculoskeletal: In the T10 vertebral body there is a large lucent lesion with extension of soft tissue into the right paravertebral region and involvement of the head of the right tenth rib overall  estimated to measure approximately 4.9 x 3.4 x 2.0 cm (axial image 105 of series 6 and sagittal image 117 of series 9). This causes significant narrowing of the central spinal canal (approximately 5 mm AP) at this level. Median sternotomy wires. Review of the MIP images confirms the above findings. CTA ABDOMEN AND PELVIS FINDINGS VASCULAR Aorta: Mild aortic atherosclerosis. Normal caliber aorta without aneurysm, dissection, vasculitis or significant stenosis. Celiac: Patent without evidence of aneurysm, dissection, vasculitis or significant stenosis. SMA: Patent without evidence of aneurysm, dissection, vasculitis or significant stenosis. Renals: Both renal arteries are patent without evidence of aneurysm, dissection, vasculitis, fibromuscular dysplasia or significant stenosis. IMA:  Patent without evidence of aneurysm, dissection, vasculitis or significant stenosis. Inflow: Patent without evidence of aneurysm, dissection, vasculitis or significant stenosis. Veins: No obvious venous abnormality within the limitations of this arterial phase study. Review of the MIP images confirms the above findings. NON-VASCULAR Hepatobiliary: No suspicious appearing cystic or solid hepatic lesions are confidently identified on today's arterial phase examination. No intra or extrahepatic biliary ductal dilatation. Several partially calcified gallstones are noted measuring up to 1.6 cm in diameter. Gallbladder is otherwise unremarkable in appearance. Pancreas: No pancreatic mass. No pancreatic ductal dilatation. No pancreatic or peripancreatic fluid collections or inflammatory changes. Spleen: Unremarkable. Adrenals/Urinary Tract: Bilateral kidneys and adrenal glands are normal in appearance. No hydroureteronephrosis. Urinary bladder is normal in appearance. Stomach/Bowel: Normal appearance of the stomach. No pathologic dilatation of small bowel or colon. The appendix is not confidently identified and may be surgically absent. Regardless, there are no inflammatory changes noted adjacent to the cecum to suggest the presence of an acute appendicitis at this time. Lymphatic: No lymphadenopathy noted in the abdomen or pelvis. Reproductive: Prostate gland and seminal vesicles are unremarkable in appearance. Other: No significant volume of ascites.  No pneumoperitoneum. Musculoskeletal: There are no aggressive appearing lytic or blastic lesions noted in the visualized portions of the skeleton. Review of the MIP images confirms the above findings. IMPRESSION: 1. 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 2. Small lesions associated with the undersurface of  the mechanical aortic valve and the superior aspect of the septal leaflet of the tricuspid valve, which may reflect valve associated thrombus and/or vegetations, as detailed above. 3. Aggressive appearing lesion in T10 vertebral body with extends in into the right paravertebral soft tissues and involvement of the head of the right tenth rib, with marked narrowing of the central spinal canal at this level, as detailed above. Primary differential considerations include a plasmacytoma, or less likely and osteoblastoma or solitary metastatic lesion (no primary lesion is confidently identified). If there is clinical concern for spinal cord compression, the full extent of this lesion could be better evaluated with thoracic spine MRI with and without IV gadolinium. 4. Bovine type thoracic aortic arch (normal anatomical variant) incidentally noted. 5. Cholelithiasis without evidence of acute cholecystitis at this time. 6. Additional incidental findings, as above. These results will be called to the ordering clinician or representative by the Radiologist Assistant, and communication documented in the PACS or [employer]. Electronically Signed   By: Vinnie Langton M.D.   On: 09/12/2019 13:02    Cardiac Studies   Echo 09/09/19: 1. Tricuspid valve vegetation. Discussed with referring provider.  2. Left ventricular ejection fraction, by estimation, is 55 to 60%. The  left ventricle has normal function. The left ventricle has  no regional  wall motion abnormalities. Left ventricular diastolic parameters were  normal.  3. Right ventricular systolic function is normal. The right ventricular  size is mildly enlarged. There is normal pulmonary artery systolic  pressure.  4. Left atrial size was moderately dilated.  5. Right atrial size was severely dilated.  6. The mitral valve is normal in structure. Mild mitral valve  regurgitation. No evidence of mitral stenosis.  7. There is a 1.8 x 1.1 cm tricuspid  valve mass on the atrial surface  consistent with vegetation. . Tricuspid valve regurgitation is moderate.  8. Transaortic velocity is higher than expected for mechanical aortic  valve. The aortic valve is normal in structure. Aortic valve regurgitation  is not visualized. No aortic stenosis is present. There is a mechanical  valve present in the aortic  position. Procedure Date: 2000. Aortic valve mean gradient measures 33.5  mmHg. Aortic valve Vmax measures 3.68 m/s.  9. Aortic root/ascending aorta has been repaired/replaced and Bentall.  10. The inferior vena cava is normal in size with greater than 50%  respiratory variability, suggesting right atrial pressure of 3 mmHg.   TEE 09/11/19: 1. Left ventricular ejection fraction, by estimation, is 55 to 60%. The  left ventricle has normal function. The left ventricle has no regional  wall motion abnormalities. There is mild concentric left ventricular  hypertrophy.  2. Right ventricular systolic function is normal. The right ventricular  size is mildly enlarged. There is mildly elevated pulmonary artery  systolic pressure. The estimated right ventricular systolic pressure is  [id number] mmHg.  3. It appears that the left atrial appendage has been surgically resected  or oversown. Left atrial size was severely dilated. No left atrial/left  atrial appendage thrombus was detected. The LAA emptying velocity was 35  cm/s.  4. Right atrial size was severely dilated.  5. The mitral valve is normal in structure. Mild mitral valve  regurgitation.  6. There is a large, moderately mobile vegetation attached to the septal  leaflet of the tricuspid valve, measuring 27 mm in length and 11 mm in  width. It appears heterogeneous and friable.. Tricuspid valve  regurgitation is moderate.  7. There is a large vegetation attached to the medial aspect of posterior  disc or the posteromedial mechanical valve annulus. It is highly mobile  and prolapses  readily across the valve orifice. It measures 15 mm in  length and 6 mm in width. Although  there is no evidence of a periannular abscess cavity, the periannular  tissue echodensity is heterogeneous, raining concern for  inflammation/early abscess formation. The proximity of the aortic and the  tricuspid vegetation to the same area of the  interventricular septum also raises concern for intramyocardial abscess or  fistula formation. The aortic valve has been repaired/replaced. Aortic  valve regurgitation is trivial. Mild aortic valve stenosis. There is a  unknown [employer] tilting disk valve  present in the aortic position. Procedure Date: 2000. Aortic valve mean  gradient measures 20.0 mmHg.  8. There is mild (Grade II) atheroma plaque involving the transverse  aorta.   Patient Profile     54 y.o. male with history of Bentall procedure in 2000 with CABG, SVG-RCA PCI with BMS in Feb 2015, he had re-do PCI in June 2019 DES to SVG graft, afib on coumadin, TIA in 2004, NIDDM, HTN, and diabetes who is being seen fordyspnea. Admitted with fever and GI symptoms.  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 with  large vegetation on mechanical aortic valve with large mobile vegetation on TV - possible early abscess fomration - blood cx 4/19 with strep infantarius - blood cx 4/20 NGTD - TCTS consulted, planning surgery next week - underwent CT yesterday - plan for coronary/graft angiography Monday - INR 2.6 - plan for heparin when INR < 2.5   CAD s/p CABG with subsequent PCI Bental procedure with mechanical AVR - coumadin on hold, last dose 4/18 - INR 2.6 - plan for left heart cath on Monday once INR has drifted down - plavix on hold (last dose 4/19), started ASA   Paroxysmal atrial fibrillation - start heparin drip once INR < 2.5 for Afib and mechanical valve   T10 lesion - incidental finding on CT scan yesterday - TCTS recommended consult to neurosurgery for  possible biopsy - concern for vertebral osteomyelitis and phlegmon vs metastatic lesion vs primary bone tumor       For questions or updates, please contact [facility] Please consult [email or web address] for contact info under        Signed, Ledora Bottcher, PA  09/13/2019, 7:53 AM    Patient seen and examined.  Agree with above documentation.  On exam, patient is alert and oriented, regular rate and rhythm, 2 out of 6 systolic murmur, mechanical click, lungs CTAB, no LE edema or JVD.  Telemetry personally reviewed and shows sinus rhythm with rate 80s, PVCs.  CT yesteday showed destructive lesion in T10 vertebral body.  Neurosurgery consulted.  Blood cultures from 4/20 remain negative.  On ceftriaxone and gentamicin per ID.  Holding warfarin and INR trending down (2.6 today), will start heparin drip once INR less than 2.5.  Planning for [facility] on Monday.  Dr. Cyndia Bent planning surgery for middle of next week.  Risks and benefits of cardiac catheterization have been discussed with the patient.  These include bleeding, infection, kidney damage, stroke, heart attack, death.  The patient understands these risks and is willing to proceed.  Donato Heinz, MD

## 2019-09-13 NOTE — Progress Notes (Addendum)
PROGRESS NOTE    [name]  [id number] DOB: 1966/01/30 DOA: 09/08/2019 PCP: Biagio Borg, MD    Chief Complaint  Patient presents with  . Shortness of Breath  . Emesis   Brief Narrative:  54 y.o.malewith history of CAD s/p CABG in 2000, SVG-RCA PCI with BMS in Feb 2015, he had re-do PCI in June 2019 DES to SVG graft, ascending aortic dissection in 2000 s/p repair with AVR on warfarin.  Paroxysmal Naavya Postma. fib.  NIDDM-2, HTN and HLD presenting with 5 days of chills, nausea, vomiting, diarrhea and dyspnea on exertion and orthopnea mainly with lying on the left side.  In ED, hemodynamically stable and saturating well on room air.  Mild temp to 100.1 F. Cr 1.31 (baseline 1 to 1.1).  BUN 17.  Hgb 10.4 (baseline 11-12).  BNP 535. HS trop 34> 28.  CXR without acute finding.  INR 2.3.  COVID-19 negative.  Cardiology consulted by EDP.  Admitted for nausea, vomiting, diarrhea and DOE.  Echocardiogram ordered by cardiology.  The next day, echo with EF of 55 to 60%, severe RAE, moderate LAE, 1.8 x 1.1 cm tricuspid valve mass on the atrial surface consistent with vegetation and moderate TVR.  Blood cultures with GPC in clusters in 1/2.  BCID with Streptococcus species.  ID consulted and started on ceftriaxone and vancomycin for infective endocarditis/bacteremia.  Repeat blood culture obtained on 4/20.   Assessment & Plan:   Principal Problem:   Suspected endocarditis mechanical aortic valve Active Problems:   Diabetes mellitus type II, non insulin dependent (HCC)   Hyperlipidemia   Essential hypertension   PAF (paroxysmal atrial fibrillation) (HCC)   Hypothyroidism   Normocytic anemia   Shortness of breath   Obesity (BMI 30-39.9)   CAD (coronary artery disease)   Nausea vomiting and diarrhea   Gram-positive cocci bacteremia   Endocarditis of tricuspid valve  Infective endocarditis  Strep Infantarius Bacteremia: Patient with fever and chills.  Echocardiogram with tricuspid valve  vegetation and regurgitation.   - blood cx 4/19 strep infantarius intermediate to penicillin - blood cx 4/20 cx NGTD - ID consulted, appreciate recommendations - continue ceftriaxone/gentamicin - consider colonoscopy prior to surgery for ? Polyps per ID - TEE today with large vegetations on septal leaflet of the tricuspid valve and on the posterior disc of the mechanical aortic valve (see report) - CT surgery c/s, they'll see Mr. Pouch 4/22 - Repeat blood culture 4/20 NGTD - ok for PICC - orthopantogram with dental caries and periodontal disease 4/21 - CT surgery c/s, planning for surgery in middle to end of next week depending on timing of any dental treatment - planning for dental procedure on 4/27 at 7:30 AM - and cardiac catheterization planed for Monday - CT angio chest abdomen pelvis   Aggressive appearing lesion in T10 vertebral body: plasmacytoma vs osteoblastoma vs solitary metastatic lesion.   Planning for bone bx by IR per neurosurgery  Nausea/vomiting/diarrhea: Could be due to the above.  GIP negative.  GI symptoms resolved. -Continue symptomatic care -Discontinue enteric precautions  Dyspnea on exertion/orthopnea:echo with EF of 55 to 60%, severe RAE, moderate LAE, 1.8 x 1.1 cm tricuspid valve mass on the atrial surface consistent with vegetation and moderate TVR.  No signs of fluid overload.  BNP slightly elevated but no vascular congestion on CXR.  Has history of asthma but not wheezing or coughing.  INR therapeutic to think of PE.  Overall, respiratory symptoms improved. -Hold home Lasix in the setting of  AKI.  CAD s/p CABG in 2000, SVG-RCA PCI with BMS in Feb 2015, he had re-do PCI in June 2019 DES to SVG graft: Echocardiogram as above. -Continue home ASA and statin.   -Plavix on hold in the setting of #1.  Last dose on 4/19 -Metoprolol, losartan and Lasix on hold due to AKI and soft blood pressures  Elevated troponin: Likely demand ischemia in the setting of the  above.  No exertional chest pain. -Cardiac meds as above  Ascending aortic dissection in 2000 s/p repair with Mechanical AVR on warfarin.  INR therapeutic. -Plan to start IV heparin once INR less than 2.5 -Monitor fluid status and renal function.  Paroxysmal Dexter Sauser. fib.  Rate controlled on metoprolol. -Metoprolol on hold per cardiology. -Anticoagulation as above.  Uncontrolled NIDDM-2 with hyperglycemia: A1c 8.2%. -Continue SSI-moderate -Start Levemir 6 units twice daily and NovoLog 6 units AC -Continue statin.  Check lipid panel  Acute kidney injury: Likely due to hypotension and nephrotoxic meds (vancomycin, Lasix and losartan) -Holding metoprolol, Lasix and losartan -Continue monitoring  Essential HTN:  -metoprolol, amlodipine and losartan on hold.  BP's reasonable  HLD  -Check lipid panel -Continue atorvastatin.  Hypothyroidism: -Continue home Synthroid.  At risk for sleep apnea: STOP BANG score 6-7 which puts him at high risk for sleep apnea. -Needs outpatient sleep study.  Morbid obesity: Body mass index is 36.03 kg/m.  With comorbidities as above. -Encourage lifestyle change to lose weight.  DVT prophylaxis: heparin to start when INR decreases < 2.5 Code Status: full  Family Communication: wife at bedside Disposition:   Status is: Inpatient  Remains inpatient appropriate because:Inpatient level of care appropriate due to severity of illness   Dispo: The patient is from: Home              Anticipated d/c is to: pending              Anticipated d/c date is: > 3 days              Patient currently is not medically stable to d/c.   Consultants:   ID  Cardiology  CT surgery  Procedures:  Transesophageal Echo IMPRESSIONS    1. Left ventricular ejection fraction, by estimation, is 55 to 60%. The  left ventricle has normal function. The left ventricle has no regional  wall motion abnormalities. There is mild concentric left ventricular    hypertrophy.  2. Right ventricular systolic function is normal. The right ventricular  size is mildly enlarged. There is mildly elevated pulmonary artery  systolic pressure. The estimated right ventricular systolic pressure is  [id number] mmHg.  3. It appears that the left atrial appendage has been surgically resected  or oversown. Left atrial size was severely dilated. No left atrial/left  atrial appendage thrombus was detected. The LAA emptying velocity was 35  cm/s.  4. Right atrial size was severely dilated.  5. The mitral valve is normal in structure. Mild mitral valve  regurgitation.  6. There is Perseus Westall large, moderately mobile vegetation attached to the septal  leaflet of the tricuspid valve, measuring 27 mm in length and 11 mm in  width. It appears heterogeneous and friable.. Tricuspid valve  regurgitation is moderate.  7. There is Vishnu Moeller large vegetation attached to the medial aspect of posterior  disc or the posteromedial mechanical valve annulus. It is highly mobile  and prolapses readily across the valve orifice. It measures 15 mm in  length and 6 mm in width. Although  there  is no evidence of Christophere Hillhouse periannular abscess cavity, the periannular  tissue echodensity is heterogeneous, raining concern for  inflammation/early abscess formation. The proximity of the aortic and the  tricuspid vegetation to the same area of the  interventricular septum also raises concern for intramyocardial abscess or  fistula formation. The aortic valve has been repaired/replaced. Aortic  valve regurgitation is trivial. Mild aortic valve stenosis. There is Carianne Taira  unknown [employer] tilting disk valve  present in the aortic position. Procedure Date: 2000. Aortic valve mean  gradient measures 20.0 mmHg.  8. There is mild (Grade II) atheroma plaque involving the transverse  aorta.   Comparison(s): Compared to previous reports, there is evidence of  vegetations on the tricuspid valve and the mechanical aortic valve  and  suspicion for early perivalvular abscess. The dimensionless aortic vale  index is similar to older studies. This  suggests that the increased aortic valve gradients are due to increased  cardiac output (rather than obstruction from the vegetation or compromised  prosthetic disc motion).   Echo IMPRESSIONS    1. Tricuspid valve vegetation. Discussed with referring provider.  2. Left ventricular ejection fraction, by estimation, is 55 to 60%. The  left ventricle has normal function. The left ventricle has no regional  wall motion abnormalities. Left ventricular diastolic parameters were  normal.  3. Right ventricular systolic function is normal. The right ventricular  size is mildly enlarged. There is normal pulmonary artery systolic  pressure.  4. Left atrial size was moderately dilated.  5. Right atrial size was severely dilated.  6. The mitral valve is normal in structure. Mild mitral valve  regurgitation. No evidence of mitral stenosis.  7. There is Sigfredo Schreier 1.8 x 1.1 cm tricuspid valve mass on the atrial surface  consistent with vegetation. . Tricuspid valve regurgitation is moderate.  8. Transaortic velocity is higher than expected for mechanical aortic  valve. The aortic valve is normal in structure. Aortic valve regurgitation  is not visualized. No aortic stenosis is present. There is Evadene Wardrip mechanical  valve present in the aortic  position. Procedure Date: 2000. Aortic valve mean gradient measures 33.5  mmHg. Aortic valve Vmax measures 3.68 m/s.  9. Aortic root/ascending aorta has been repaired/replaced and Bentall.  10. The inferior vena cava is normal in size with greater than 50%  respiratory variability, suggesting right atrial pressure of 3 mmHg.  Antimicrobials:  Anti-infectives (From admission, onward)   Start     Dose/Rate Route Frequency Ordered Stop   09/11/19 1700  gentamicin (GARAMYCIN) 300 mg in dextrose 5 % 50 mL IVPB     300 mg 115 mL/hr over 30 Minutes  Intravenous Every 24 hours 09/11/19 1540     09/10/19 2300  vancomycin (VANCOREADY) IVPB 1750 mg/350 mL  Status:  Discontinued     1,750 mg 175 mL/hr over 120 Minutes Intravenous Every 24 hours 09/10/19 0721 09/11/19 1511   09/10/19 0600  vancomycin (VANCOREADY) IVPB 1250 mg/250 mL  Status:  Discontinued     1,250 mg 166.7 mL/hr over 90 Minutes Intravenous Every 12 hours 09/09/19 1345 09/10/19 0721   09/09/19 1430  cefTRIAXone (ROCEPHIN) 2 g in sodium chloride 0.9 % 100 mL IVPB     2 g 200 mL/hr over 30 Minutes Intravenous Every 24 hours 09/09/19 1335     09/09/19 1430  vancomycin (VANCOCIN) 2,500 mg in sodium chloride 0.9 % 500 mL IVPB     2,500 mg 250 mL/hr over 120 Minutes Intravenous  Once 09/09/19 1345 09/09/19  1847      Subjective: No new complaints Has had some back pain for Maly Lemarr little while  Objective: Vitals:   09/12/19 2055 09/13/19 0330 09/13/19 0500 09/13/19 1548  BP: 125/77 123/74  130/79  Pulse: 81 79  94  Resp: 17 19  16   Temp: 98.5 F (36.9 C) 99.6 F (37.6 C)  98.4 F (36.9 C)  TempSrc: Oral Oral  Oral  SpO2: 100% 97%  100%  Weight:   129.1 kg   Height:        Intake/Output Summary (Last 24 hours) at 09/13/2019 1656 Last data filed at 09/13/2019 1100 Gross per 24 hour  Intake 695 ml  Output 550 ml  Net 145 ml   Filed Weights   09/11/19 0458 09/12/19 0356 09/13/19 0500  Weight: 127 kg 127.5 kg 129.1 kg    Examination:  General: No acute distress. Cardiovascular: Heart sounds show Dhaval Woo regular rate, and rhythm. Click. Lungs: Clear to auscultation bilaterally Abdomen: Soft, nontender, nondistended Neurological: Alert and oriented 3. Moves all extremities 4 with equal strength. Cranial nerves II through XII grossly intact. Skin: Warm and dry. No rashes or lesions. Extremities: No clubbing or cyanosis. No edema.   Data Reviewed: I have personally reviewed following labs and imaging studies  CBC: Recent Labs  Lab 09/08/19 0810 09/09/19 0406  09/10/19 0448 09/12/19 0535 09/13/19 0424  WBC 10.3 7.7 9.4 10.7* 7.7  NEUTROABS  --   --   --  8.1* 5.7  HGB 10.4* 10.2* 10.5* 10.6* 8.9*  HCT 30.5* 28.7* 29.7* 29.7* 25.4*  MCV 83.1 81.3 80.7 80.5 80.9  PLT 206 194 213 252 [id number]    Basic Metabolic Panel: Recent Labs  Lab 09/08/19 0810 09/08/19 1716 09/09/19 0406 09/10/19 0448 09/11/19 0508 09/12/19 0535 09/13/19 0424  NA   < >  --  138 137 137 137 138  K   < >  --  3.9 3.5 3.3* 3.7 3.7  CL   < >  --  104 104 108 107 107  CO2   < >  --  24 23 22 22 23   GLUCOSE   < >  --  187* 181* 203* 199* 160*  BUN   < >  --  13 16 15 13 13   CREATININE   < >  --  1.11 1.50* 1.27* 1.17 1.01  CALCIUM   < >  --  8.9 8.6* 8.4* 8.7* 8.4*  MG  --  1.6*  --  1.5*  --  1.7 1.7  PHOS  --   --   --  3.2  --  2.7 3.3   < > = values in this interval not displayed.    GFR: Estimated Creatinine Clearance: 119.4 mL/min (by C-G formula based on SCr of 1.01 mg/dL).  Liver Function Tests: Recent Labs  Lab 09/09/19 0406 09/10/19 0448 09/13/19 0424  AST 23  --  19  ALT 15  --  12  ALKPHOS 80  --  64  BILITOT 1.1  --  0.9  PROT 7.2  --  6.5  ALBUMIN 2.5* 2.6* 2.3*    CBG: Recent Labs  Lab 09/12/19 1645 09/12/19 2115 09/13/19 0728 09/13/19 1149 09/13/19 1639  GLUCAP 157* 184* 153* 217* 216*     Recent Results (from the past 240 hour(s))  SARS CORONAVIRUS 2 (TAT 6-24 HRS) Nasopharyngeal Nasopharyngeal Swab     Status: None   Collection Time: 09/08/19  1:50 PM   Specimen: Nasopharyngeal Swab  Result Value Ref Range Status   SARS Coronavirus 2 NEGATIVE NEGATIVE Final    Comment: (NOTE) SARS-CoV-2 target nucleic acids are NOT DETECTED. The SARS-CoV-2 RNA is generally detectable in upper and lower respiratory specimens during the acute phase of infection. Negative results do not preclude SARS-CoV-2 infection, do not rule out co-infections with other pathogens, and should not be used as the sole basis for treatment or other patient  management decisions. Negative results must be combined with clinical observations, patient history, and epidemiological information. The expected result is Negative. Fact Sheet for Patients: SugarRoll.be Fact Sheet for Healthcare Providers: https://www.woods-mathews.com/ This test is not yet approved or cleared by the [place] FDA and  has been authorized for detection and/or diagnosis of SARS-CoV-2 by FDA under an Emergency Use Authorization (EUA). This EUA will remain  in effect (meaning this test can be used) for the duration of the COVID-19 declaration under Section 56 4(b)(1) of the Act, 21 U.S.C. section 360bbb-3(b)(1), unless the authorization is terminated or revoked sooner. Performed at [facility], [place] [address], [place], [place] [address]   Gastrointestinal Panel by PCR , Stool     Status: None   Collection Time: 09/09/19 12:21 PM   Specimen: Stool  Result Value Ref Range Status   Campylobacter species NOT DETECTED NOT DETECTED Final   Plesimonas shigelloides NOT DETECTED NOT DETECTED Final   Salmonella species NOT DETECTED NOT DETECTED Final   Yersinia enterocolitica NOT DETECTED NOT DETECTED Final   Vibrio species NOT DETECTED NOT DETECTED Final   Vibrio cholerae NOT DETECTED NOT DETECTED Final   Enteroaggregative E coli (EAEC) NOT DETECTED NOT DETECTED Final   Enteropathogenic E coli (EPEC) NOT DETECTED NOT DETECTED Final   Enterotoxigenic E coli (ETEC) NOT DETECTED NOT DETECTED Final   Shiga like toxin producing E coli (STEC) NOT DETECTED NOT DETECTED Final   Shigella/Enteroinvasive E coli (EIEC) NOT DETECTED NOT DETECTED Final   Cryptosporidium NOT DETECTED NOT DETECTED Final   Cyclospora cayetanensis NOT DETECTED NOT DETECTED Final   Entamoeba histolytica NOT DETECTED NOT DETECTED Final   Giardia lamblia NOT DETECTED NOT DETECTED Final   Adenovirus F40/41 NOT DETECTED NOT DETECTED Final   Astrovirus NOT  DETECTED NOT DETECTED Final   Norovirus GI/GII NOT DETECTED NOT DETECTED Final   Rotavirus Aiken Withem NOT DETECTED NOT DETECTED Final   Sapovirus (I, II, IV, and V) NOT DETECTED NOT DETECTED Final    Comment: Performed at [facility], [address] [place], [place] [address]  Culture, blood (Routine X 2) w Reflex to ID Panel     Status: Abnormal   Collection Time: 09/09/19  1:12 PM   Specimen: BLOOD  Result Value Ref Range Status   Specimen Description BLOOD RIGHT ANTECUBITAL  Final   Special Requests   Final    BOTTLES DRAWN AEROBIC AND ANAEROBIC Blood Culture adequate volume Performed at [facility], [address] [place], [place] [address]    Culture  Setup Time   Final    GRAM POSITIVE COCCI ANAEROBIC BOTTLE ONLY CRITICAL RESULT CALLED TO, READ BACK BY AND VERIFIED WITH: PHARMD SEAY LAURA AT 0546  BY MESSAN HOUEGNIFIO ON 09/10/2019     Culture STREPTOCOCCUS INFANTARIUS (Adalene Gulotta)  Final   Report Status 09/12/2019 FINAL  Final   Organism ID, Bacteria STREPTOCOCCUS INFANTARIUS  Final      Susceptibility   Streptococcus infantarius - MIC*    PENICILLIN 0.25 INTERMEDIATE Intermediate     CEFTRIAXONE 0.25 SENSITIVE  Sensitive     ERYTHROMYCIN <=0.12 SENSITIVE Sensitive     LEVOFLOXACIN 2 SENSITIVE Sensitive     VANCOMYCIN <=0.12 SENSITIVE Sensitive     * STREPTOCOCCUS INFANTARIUS  Culture, blood (Routine X 2) w Reflex to ID Panel     Status: Abnormal   Collection Time: 09/09/19  1:12 PM   Specimen: BLOOD LEFT HAND  Result Value Ref Range Status   Specimen Description BLOOD LEFT HAND  Final   Special Requests   Final    BOTTLES DRAWN AEROBIC AND ANAEROBIC Blood Culture adequate volume   Culture  Setup Time   Final    AEROBIC BOTTLE ONLY GRAM POSITIVE COCCI IN CHAINS GRAM POSITIVE COCCI IN CLUSTERS CRITICAL VALUE NOTED.  VALUE IS CONSISTENT WITH PREVIOUSLY REPORTED AND CALLED VALUE.    Culture (Emmely Bittinger)  Final    STREPTOCOCCUS INFANTARIUS SUSCEPTIBILITIES PERFORMED ON PREVIOUS  CULTURE WITHIN THE LAST 5 DAYS. Performed at [facility], [place] [address], [place], [place] [address]    Report Status 09/12/2019 FINAL  Final  Blood Culture ID Panel (Reflexed)     Status: Abnormal   Collection Time: 09/09/19  1:12 PM  Result Value Ref Range Status   Enterococcus species NOT DETECTED NOT DETECTED Final   Listeria monocytogenes NOT DETECTED NOT DETECTED Final   Staphylococcus species NOT DETECTED NOT DETECTED Final   Staphylococcus aureus (BCID) NOT DETECTED NOT DETECTED Final   Streptococcus species DETECTED (Kaija Kovacevic) NOT DETECTED Final    Comment: Not Enterococcus species, Streptococcus agalactiae, Streptococcus pyogenes, or Streptococcus pneumoniae. CRITICAL RESULT CALLED TO, READ BACK BY AND VERIFIED WITH: Criss Rosales PharmD 12:50 09/10/19 ([email or web address])    Streptococcus agalactiae NOT DETECTED NOT DETECTED Final   Streptococcus pneumoniae NOT DETECTED NOT DETECTED Final   Streptococcus pyogenes NOT DETECTED NOT DETECTED Final   Acinetobacter baumannii NOT DETECTED NOT DETECTED Final   Enterobacteriaceae species NOT DETECTED NOT DETECTED Final   Enterobacter cloacae complex NOT DETECTED NOT DETECTED Final   Escherichia coli NOT DETECTED NOT DETECTED Final   Klebsiella oxytoca NOT DETECTED NOT DETECTED Final   Klebsiella pneumoniae NOT DETECTED NOT DETECTED Final   Proteus species NOT DETECTED NOT DETECTED Final   Serratia marcescens NOT DETECTED NOT DETECTED Final   Haemophilus influenzae NOT DETECTED NOT DETECTED Final   Neisseria meningitidis NOT DETECTED NOT DETECTED Final   Pseudomonas aeruginosa NOT DETECTED NOT DETECTED Final   Candida albicans NOT DETECTED NOT DETECTED Final   Candida glabrata NOT DETECTED NOT DETECTED Final   Candida krusei NOT DETECTED NOT DETECTED Final   Candida parapsilosis NOT DETECTED NOT DETECTED Final   Candida tropicalis NOT DETECTED NOT DETECTED Final    Comment: Performed at [facility], [place]. [address], [place], [place]  [address]  Culture, blood (routine x 2)     Status: None (Preliminary result)   Collection Time: 09/10/19  2:46 PM   Specimen: BLOOD RIGHT FOREARM  Result Value Ref Range Status   Specimen Description BLOOD RIGHT FOREARM  Final   Special Requests   Final    BOTTLES DRAWN AEROBIC AND ANAEROBIC Blood Culture adequate volume   Culture   Final    NO GROWTH 3 DAYS Performed at [facility], [place] [address], [place], [place] [address]    Report Status PENDING  Incomplete  Culture, blood (routine x 2)     Status: None (Preliminary result)   Collection Time: 09/10/19  2:51 PM   Specimen: BLOOD RIGHT WRIST  Result Value  Ref Range Status   Specimen Description BLOOD RIGHT WRIST  Final   Special Requests   Final    BOTTLES DRAWN AEROBIC ONLY Blood Culture adequate volume   Culture   Final    NO GROWTH 3 DAYS Performed at [facility], [address], [place], [place] [address]    Report Status PENDING  Incomplete         Radiology Studies: DG Orthopantogram  Result Date: 09/11/2019 CLINICAL DATA:  Pre-op testing. Molar pain. EXAM: ORTHOPANTOGRAM/PANORAMIC COMPARISON:  None. FINDINGS: Several teeth are absent. There is typical midline artifact of the Panorex technique. Significant dental caries involving the right maxillary wisdom tooth and the most posterior remaining left mandibular molar. That tooth also demonstrates significant periodontal disease. No evidence of acute fracture or dislocation. IMPRESSION: Dental caries and periodontal disease as described. Electronically Signed   By: Richardean Sale M.D.   On: 09/11/2019 18:04   MR THORACIC SPINE W WO CONTRAST  Result Date: 09/12/2019 CLINICAL DATA:  Bone lesion EXAM: MRI THORACIC WITHOUT AND WITH CONTRAST TECHNIQUE: Multiplanar and multiecho pulse sequences of the thoracic spine were obtained without and with intravenous contrast. CONTRAST:  48mL GADAVIST GADOBUTROL 1 MMOL/ML IV SOLN COMPARISON:  CTA chest/abdomen/pelvis  09/12/2019 FINDINGS: MRI THORACIC SPINE FINDINGS The examination is intermittently motion degraded. No scout localizer imaging is provided. Spinal numbering is ascertained by numbering caudally from the C2 level on same-day CTA chest/abdomen/pelvis. Alignment: No significant spondylolisthesis. Vertebrae: There is mild loss of T10 vertebral body height. Vertebral body height is otherwise maintained. There is prominent STIR hyperintensity and enhancement within the central and right aspect of the right T10 vertebral body also extending partly into the right T10 pedicle and transverse process. The mass extends into the right paravertebral soft tissues, with this component measuring 2.1 x 1.7 x 2.6 cm (AP x TV x CC) (series 15, image 54) (series 7, image 2). As demonstrated on CT performed earlier the same day, there is involvement of the head of the right tenth rib. The T10 lesion is slightly expansile and there is Maybell Misenheimer convex posterior vertebral margin. There are multiple additional subcentimeter foci of enhancement within the thoracic spine which are indeterminate. These are present within the T7, T8 and T12 vertebrae. Cord: No spinal cord signal abnormality or abnormal cord enhancement. No abnormal enhancement is identified within the spinal canal. Paraspinal and other soft tissues: Right paraspinal extension of Zyah Gomm T10 vertebral body mass as described above. The paraspinal soft tissues are otherwise unremarkable. Disc levels: No more than mild disc degeneration at any level. At the T10 level, bony expansion and facet hypertrophy contribute to severe spinal canal stenosis with near complete effacement of the thecal sac. There may be mild cord impingement. Bony expansion and facet arthrosis also contributes to moderate/severe right neural foraminal narrowing at T10-T11. At the remaining thoracic levels, there is no focal disc herniation. There is multilevel facet arthrosis and ligamentum flavum hypertrophy which is  greatest in the lower thoracic spine. No more than mild spinal canal narrowing at these remaining levels. No significant foraminal narrowing at the remaining levels. IMPRESSION: Again demonstrated is an aggressive appearing mass centered within the T10 vertebral body also extending into the right T10 pedicle and articular pillar. There is right paraspinal extension of this mass with this component measuring 2.6 cm and involving the head of the right tenth rib. There is mild loss of T10 vertebral body height. Primary differential considerations include plasmacytoma, metastatic disease, osteoblastoma or other  aggressive primary bone lesion. Consider direct tissue sampling. Posterior bony expansion and facet hypertrophy contribute to severe spinal canal stenosis at the T10 level with near complete effacement of the thecal sac. Moderate/severe right T10-T11 neural foraminal narrowing. There are subcentimeter foci of enhancement within the T7, T8 and T12 vertebrae which are indeterminate. Electronically Signed   By: Kellie Simmering DO   On: 09/12/2019 20:42   CT ANGIO CHEST AORTA W/CM & OR WO/CM  Result Date: 09/12/2019 CLINICAL DATA:  54 year old male under preoperative evaluation prior to potential redo Bentall procedure. EXAM: CT ANGIOGRAPHY CHEST, ABDOMEN AND PELVIS TECHNIQUE: Non-contrast CT of the chest was initially obtained. Multidetector CT imaging through the chest, abdomen and pelvis was performed using the standard protocol during bolus administration of intravenous contrast. Multiplanar reconstructed images and MIPs were obtained and reviewed to evaluate the vascular anatomy. CONTRAST:  139mL OMNIPAQUE IOHEXOL 350 MG/ML SOLN COMPARISON:  Chest CTA 10/09/2014. CTA of the chest, abdomen and pelvis 07/24/2013. FINDINGS: CTA CHEST FINDINGS Cardiovascular: Heart size is enlarged. There is no significant pericardial fluid, thickening or pericardial calcification. Status post median sternotomy for Bentall  procedure with mechanical aortic valve and ascending thoracic aortic graft. There is Leanore Biggers small defect on the undersurface of the mechanical aortic valve best appreciated on coronal image 82 of series 8 measuring approximately 8 mm, which may represent valve associated thrombus and/or vegetation. The aortic arch is mildly aneurysmal measuring up to 4.3 cm in diameter. There is Illana Nolting residual dissection flap in the proximal and mid aortic arch which does not propagate into the great vessels. Descending thoracic aorta is normal in caliber measuring 2.8 cm in diameter. Bovine type thoracic aortic arch incidentally noted. There is Ella Golomb right coronary artery bypass graft. Large filling defect in the superior aspect of the right atrium immediately posterior to the septal leaflet of the tricuspid valve, and intimately associated with the adjacent aortic root, estimated to measure approximately 2.2 x 2.2 x 1.7 cm (axial image 83 of series 6 and coronal image 73 of series 8), presumably Danese Dorsainvil vegetation. Mediastinum/Nodes: No pathologically enlarged mediastinal or hilar lymph nodes. Esophagus is unremarkable in appearance. No axillary lymphadenopathy. Lungs/Pleura: No suspicious pulmonary nodules or masses are noted. No acute consolidative airspace disease. No pleural effusions. Musculoskeletal: In the T10 vertebral body there is Susana Gripp large lucent lesion with extension of soft tissue into the right paravertebral region and involvement of the head of the right tenth rib overall estimated to measure approximately 4.9 x 3.4 x 2.0 cm (axial image 105 of series 6 and sagittal image 117 of series 9). This causes significant narrowing of the central spinal canal (approximately 5 mm AP) at this level. Median sternotomy wires. Review of the MIP images confirms the above findings. CTA ABDOMEN AND PELVIS FINDINGS VASCULAR Aorta: Mild aortic atherosclerosis. Normal caliber aorta without aneurysm, dissection, vasculitis or significant stenosis. Celiac:  Patent without evidence of aneurysm, dissection, vasculitis or significant stenosis. SMA: Patent without evidence of aneurysm, dissection, vasculitis or significant stenosis. Renals: Both renal arteries are patent without evidence of aneurysm, dissection, vasculitis, fibromuscular dysplasia or significant stenosis. IMA: Patent without evidence of aneurysm, dissection, vasculitis or significant stenosis. Inflow: Patent without evidence of aneurysm, dissection, vasculitis or significant stenosis. Veins: No obvious venous abnormality within the limitations of this arterial phase study. Review of the MIP images confirms the above findings. NON-VASCULAR Hepatobiliary: No suspicious appearing cystic or solid hepatic lesions are confidently identified on today's arterial phase examination. No intra or extrahepatic biliary ductal  dilatation. Several partially calcified gallstones are noted measuring up to 1.6 cm in diameter. Gallbladder is otherwise unremarkable in appearance. Pancreas: No pancreatic mass. No pancreatic ductal dilatation. No pancreatic or peripancreatic fluid collections or inflammatory changes. Spleen: Unremarkable. Adrenals/Urinary Tract: Bilateral kidneys and adrenal glands are normal in appearance. No hydroureteronephrosis. Urinary bladder is normal in appearance. Stomach/Bowel: Normal appearance of the stomach. No pathologic dilatation of small bowel or colon. The appendix is not confidently identified and may be surgically absent. Regardless, there are no inflammatory changes noted adjacent to the cecum to suggest the presence of an acute appendicitis at this time. Lymphatic: No lymphadenopathy noted in the abdomen or pelvis. Reproductive: Prostate gland and seminal vesicles are unremarkable in appearance. Other: No significant volume of ascites.  No pneumoperitoneum. Musculoskeletal: There are no aggressive appearing lytic or blastic lesions noted in the visualized portions of the skeleton. Review of  the MIP images confirms the above findings. IMPRESSION: 1. 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 2. Small lesions associated with the undersurface of the mechanical aortic valve and the superior aspect of the septal leaflet of the tricuspid valve, which may reflect valve associated thrombus and/or vegetations, as detailed above. 3. Aggressive appearing lesion in T10 vertebral body with extends in into the right paravertebral soft tissues and involvement of the head of the right tenth rib, with marked narrowing of the central spinal canal at this level, as detailed above. Primary differential considerations include Hurschel Paynter plasmacytoma, or less likely and osteoblastoma or solitary metastatic lesion (no primary lesion is confidently identified). If there is clinical concern for spinal cord compression, the full extent of this lesion could be better evaluated with thoracic spine MRI with and without IV gadolinium. 4. Bovine type thoracic aortic arch (normal anatomical variant) incidentally noted. 5. Cholelithiasis without evidence of acute cholecystitis at this time. 6. Additional incidental findings, as above. These results will be called to the ordering clinician or representative by the Radiologist Assistant, and communication documented in the PACS or [employer]. Electronically Signed   By: Vinnie Langton M.D.   On: 09/12/2019 13:02   VAS US DOPPLER PRE CABG  Result Date: 09/12/2019 PREOPERATIVE VASCULAR EVALUATION  Indications:      Pre-CABG. Risk Factors:     Hypertension, hyperlipidemia, Diabetes. Comparison Study: No prior studies. Performing Technologist: Carlos Levering Rvt  Examination Guidelines: Capri Raben complete evaluation includes B-mode imaging, spectral Doppler, color Doppler, and power Doppler as needed of all accessible portions of each  vessel. Bilateral testing is considered an integral part of Paislei Dorval complete examination. Limited examinations for reoccurring indications may be performed as noted.  Right Carotid Findings: +----------+--------+--------+--------+-----------------------+--------+           PSV cm/sEDV cm/sStenosisDescribe               Comments +----------+--------+--------+--------+-----------------------+--------+ CCA Prox  157     16              smooth and heterogenoustortuous +----------+--------+--------+--------+-----------------------+--------+ CCA Distal46      13              smooth and heterogenous         +----------+--------+--------+--------+-----------------------+--------+ ICA Prox  50      16              smooth and heterogenous         +----------+--------+--------+--------+-----------------------+--------+ ICA Distal42  18                                     tortuous +----------+--------+--------+--------+-----------------------+--------+ ECA       74      8                                               +----------+--------+--------+--------+-----------------------+--------+ Portions of this table do not appear on this page. +----------+--------+-------+--------+------------+           PSV cm/sEDV cmsDescribeArm Pressure +----------+--------+-------+--------+------------+ Subclavian81                     134          +----------+--------+-------+--------+------------+ +---------+--------+--+--------+--+---------+ VertebralPSV cm/s33EDV cm/s10Antegrade +---------+--------+--+--------+--+---------+ Left Carotid Findings: +----------+--------+--------+--------+-----------------------+--------+           PSV cm/sEDV cm/sStenosisDescribe               Comments +----------+--------+--------+--------+-----------------------+--------+ CCA Prox  65      16              smooth and heterogenous          +----------+--------+--------+--------+-----------------------+--------+ CCA Distal62      9               smooth and heterogenous         +----------+--------+--------+--------+-----------------------+--------+ ICA Prox  40      17                                     tortuous +----------+--------+--------+--------+-----------------------+--------+ ICA Distal56      29                                     tortuous +----------+--------+--------+--------+-----------------------+--------+ ECA       45      6                                               +----------+--------+--------+--------+-----------------------+--------+ +----------+--------+--------+--------+------------+ SubclavianPSV cm/sEDV cm/sDescribeArm Pressure +----------+--------+--------+--------+------------+           71                      136          +----------+--------+--------+--------+------------+ +---------+--------+--+--------+-+---------+ VertebralPSV cm/s38EDV cm/s9Antegrade +---------+--------+--+--------+-+---------+  ABI Findings: +--------+------------------+-----+---------+--------+ Right   Rt Pressure (mmHg)IndexWaveform Comment  +--------+------------------+-----+---------+--------+ [id number]                    triphasic         +--------+------------------+-----+---------+--------+ PTA     147               1.08 triphasic         +--------+------------------+-----+---------+--------+ DP      176               1.29 triphasic         +--------+------------------+-----+---------+--------+ +--------+------------------+-----+---------+-------+ Left    Lt Pressure (mmHg)IndexWaveform Comment +--------+------------------+-----+---------+-------+ [id number]  triphasic        +--------+------------------+-----+---------+-------+ PTA     122               0.90 triphasic        +--------+------------------+-----+---------+-------+ DP       155               1.14 triphasic        +--------+------------------+-----+---------+-------+ +-------+---------------+----------------+ ABI/TBIToday's ABI/TBIPrevious ABI/TBI +-------+---------------+----------------+ Right  1.29                            +-------+---------------+----------------+ Left   1.14                            +-------+---------------+----------------+  Right Doppler Findings: +--------+--------+-----+---------+--------+ Site    PressureIndexDoppler  Comments +--------+--------+-----+---------+--------+ [id number]          triphasic         +--------+--------+-----+---------+--------+ Radial               triphasic         +--------+--------+-----+---------+--------+ Ulnar                triphasic         +--------+--------+-----+---------+--------+  Left Doppler Findings: +--------+--------+-----+---------+--------+ Site    PressureIndexDoppler  Comments +--------+--------+-----+---------+--------+ [id number]          triphasic         +--------+--------+-----+---------+--------+ Radial               triphasic         +--------+--------+-----+---------+--------+ Ulnar                triphasic         +--------+--------+-----+---------+--------+  Summary: Right Carotid: Velocities in the right ICA are consistent with Yentl Verge 1-39% stenosis. Left Carotid: Velocities in the left ICA are consistent with Aisha Greenberger 1-39% stenosis. Vertebrals: Bilateral vertebral arteries demonstrate antegrade flow. Right ABI: Resting right ankle-brachial index is within normal range. No evidence of significant right lower extremity arterial disease. Left ABI: Resting left ankle-brachial index is within normal range. No evidence of significant left lower extremity arterial disease. Right Upper Extremity: Doppler waveforms decrease >50% with right radial compression. Doppler waveform obliterate with right ulnar compression. Left Upper Extremity: Doppler  waveform obliterate with left radial compression. Doppler waveforms remain within normal limits with left ulnar compression.  Electronically signed by Monica Martinez MD on 09/12/2019 at 8:06:35 PM.    Final    CT Angio Abd/Pel w/ and/or w/o  Result Date: 09/12/2019 CLINICAL DATA:  54 year old male under preoperative evaluation prior to potential redo Bentall procedure. EXAM: CT ANGIOGRAPHY CHEST, ABDOMEN AND PELVIS TECHNIQUE: Non-contrast CT of the chest was initially obtained. Multidetector CT imaging through the chest, abdomen and pelvis was performed using the standard protocol during bolus administration of intravenous contrast. Multiplanar reconstructed images and MIPs were obtained and reviewed to evaluate the vascular anatomy. CONTRAST:  173mL OMNIPAQUE IOHEXOL 350 MG/ML SOLN COMPARISON:  Chest CTA 10/09/2014. CTA of the chest, abdomen and pelvis 07/24/2013. FINDINGS: CTA CHEST FINDINGS Cardiovascular: Heart size is enlarged. There is no significant pericardial fluid, thickening or pericardial calcification. 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 There is Joyanna Kleman small defect on the undersurface of the mechanical aortic valve best appreciated on coronal image 82 of series 8 measuring approximately 8 mm, which may represent valve associated  thrombus and/or vegetation. The aortic arch is mildly aneurysmal measuring up to 4.3 cm in diameter. There is Caryle Helgeson residual dissection flap in the proximal and mid aortic arch which does not propagate into the great vessels. Descending thoracic aorta is normal in caliber measuring 2.8 cm in diameter. Bovine type thoracic aortic arch incidentally noted. There is Laylee Schooley right coronary artery bypass graft. Large filling defect in the superior aspect of the right atrium immediately posterior to the septal leaflet of the tricuspid valve, and intimately associated with the adjacent aortic root, estimated to  measure approximately 2.2 x 2.2 x 1.7 cm (axial image 83 of series 6 and coronal image 73 of series 8), presumably Daman Steffenhagen vegetation. Mediastinum/Nodes: No pathologically enlarged mediastinal or hilar lymph nodes. Esophagus is unremarkable in appearance. No axillary lymphadenopathy. Lungs/Pleura: No suspicious pulmonary nodules or masses are noted. No acute consolidative airspace disease. No pleural effusions. Musculoskeletal: In the T10 vertebral body there is Quantavia Frith large lucent lesion with extension of soft tissue into the right paravertebral region and involvement of the head of the right tenth rib overall estimated to measure approximately 4.9 x 3.4 x 2.0 cm (axial image 105 of series 6 and sagittal image 117 of series 9). This causes significant narrowing of the central spinal canal (approximately 5 mm AP) at this level. Median sternotomy wires. Review of the MIP images confirms the above findings. CTA ABDOMEN AND PELVIS FINDINGS VASCULAR Aorta: Mild aortic atherosclerosis. Normal caliber aorta without aneurysm, dissection, vasculitis or significant stenosis. Celiac: Patent without evidence of aneurysm, dissection, vasculitis or significant stenosis. SMA: Patent without evidence of aneurysm, dissection, vasculitis or significant stenosis. Renals: Both renal arteries are patent without evidence of aneurysm, dissection, vasculitis, fibromuscular dysplasia or significant stenosis. IMA: Patent without evidence of aneurysm, dissection, vasculitis or significant stenosis. Inflow: Patent without evidence of aneurysm, dissection, vasculitis or significant stenosis. Veins: No obvious venous abnormality within the limitations of this arterial phase study. Review of the MIP images confirms the above findings. NON-VASCULAR Hepatobiliary: No suspicious appearing cystic or solid hepatic lesions are confidently identified on today's arterial phase examination. No intra or extrahepatic biliary ductal dilatation. Several partially  calcified gallstones are noted measuring up to 1.6 cm in diameter. Gallbladder is otherwise unremarkable in appearance. Pancreas: No pancreatic mass. No pancreatic ductal dilatation. No pancreatic or peripancreatic fluid collections or inflammatory changes. Spleen: Unremarkable. Adrenals/Urinary Tract: Bilateral kidneys and adrenal glands are normal in appearance. No hydroureteronephrosis. Urinary bladder is normal in appearance. Stomach/Bowel: Normal appearance of the stomach. No pathologic dilatation of small bowel or colon. The appendix is not confidently identified and may be surgically absent. Regardless, there are no inflammatory changes noted adjacent to the cecum to suggest the presence of an acute appendicitis at this time. Lymphatic: No lymphadenopathy noted in the abdomen or pelvis. Reproductive: Prostate gland and seminal vesicles are unremarkable in appearance. Other: No significant volume of ascites.  No pneumoperitoneum. Musculoskeletal: There are no aggressive appearing lytic or blastic lesions noted in the visualized portions of the skeleton. Review of the MIP images confirms the above findings. IMPRESSION: 1. 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 2. Small lesions associated with the undersurface of the mechanical aortic valve and the superior aspect of the septal leaflet of the tricuspid valve, which may reflect valve associated thrombus and/or vegetations, as detailed above. 3. Aggressive appearing lesion in T10 vertebral body with  extends in into the right paravertebral soft tissues and involvement of the head of the right tenth rib, with marked narrowing of the central spinal canal at this level, as detailed above. Primary differential considerations include Shawndra Clute plasmacytoma, or less likely and osteoblastoma or solitary metastatic  lesion (no primary lesion is confidently identified). If there is clinical concern for spinal cord compression, the full extent of this lesion could be better evaluated with thoracic spine MRI with and without IV gadolinium. 4. Bovine type thoracic aortic arch (normal anatomical variant) incidentally noted. 5. Cholelithiasis without evidence of acute cholecystitis at this time. 6. Additional incidental findings, as above. These results will be called to the ordering clinician or representative by the Radiologist Assistant, and communication documented in the PACS or [employer]. Electronically Signed   By: Vinnie Langton M.D.   On: 09/12/2019 13:02        Scheduled Meds: . [START ON 09/16/2019] aspirin  81 mg Oral Pre-Cath  . aspirin EC  81 mg Oral Daily  . atorvastatin  40 mg Oral Q breakfast  . gabapentin  300 mg Oral Q breakfast  . insulin aspart  0-15 Units Subcutaneous TID WC  . insulin aspart  0-5 Units Subcutaneous QHS  . insulin aspart  6 Units Subcutaneous TID WC  . insulin detemir  6 Units Subcutaneous BID  . levothyroxine  50 mcg Oral Q0600  . polyethylene glycol  17 g Oral BID  . sodium chloride flush  3 mL Intravenous Once  . sodium chloride flush  3 mL Intravenous Q12H   Continuous Infusions: . sodium chloride    . [START ON 09/16/2019] sodium chloride     Followed by  . [START ON 09/16/2019] sodium chloride    . cefTRIAXone (ROCEPHIN)  IV 2 g (09/13/19 1143)  . gentamicin 300 mg (09/12/19 2121)     LOS: 5 days    Time spent: over 30 min    Fayrene Helper, MD [facility]   To contact the attending provider between 7A-7P or the covering provider during after hours 7P-7A, please log into the web site [email or web address] and access using universal [address] password for that web site. If you do not have the password, please call the hospital operator.  09/13/2019, 4:56 PM

## 2019-09-13 NOTE — Progress Notes (Signed)
Patient admitted with presumed bacterial endocarditis.  Incidentally discovered T10 bony lesion scan.  MRI scan does straits a lesion within the vertebra of T10 with some moderate bony expansion and some early spinal stenosis but no frank cord compression.  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  Full consult to follow.  Preliminary recommendation would be for interventional radiology biopsy spinal lesion.  Nothing is suggestive of infectious process.

## 2019-09-13 NOTE — Consult Note (Signed)
Providing Compassionate, Quality Care - Together   Reason for Consult: T10 vertebral body mass Referring Physician: Dr. Caron Presume is an 54 y.o. male.  HPI: Mr. Bithell has multiple comorbidities which are outlined in detail below. He was admitted on 4/18/2021with nausea, vomiting, diarrhea, and shortness of breath. He was found to have infective endocarditis. In anticipation of tricuspid valve replacement, patient was seen by dentist, Dr. Enrique Sack. Mr. Wold was found to have multiple dental issues and agreed to move forward with multiple extractions with alveoloplasty and gross debridement of remaining dentition in the OR with Dr. Enrique Sack on 09/17/2019. The valve replacement surgery will take place next week after the dental procedure is complete. CT Angio chest was performed and an aggressive-appearing T10 mass was incidentally discovered. Mr. Carmona does report mid back pain that occasionally radiates to his ribs bilaterally. He denies numbness, tingling, or weakness of his upper or lower extremities. He denies bowel or bladder dysfunction aside from the gastrointestinal distress he had upon admission. He tells me had a microdiscectomy by Dr. Rolena Infante in January of 2020. The back pain he is experiencing now is different than what he experienced with his ruptured disc.   Past Medical History:  Diagnosis Date  . Ascending aortic dissection ([place])    a. 2000 - with aortic valve involvement. S/p repair of aortic dissection with placement of mechanical AVR.  [name] CAD (coronary artery disease)    a. 2000 VG->PDA @ time of Ao dissection repair;  b. Cardiac CT 06/2010: no CAD, only mild plaque in prox RCA;  c. 2015 Cath: LM nl, LAD nl, LCX nl, RCA 100, VG->PDA 90 (4.5x12 Rebel BMS). d. 10/23/17 instent restenosis SVG to RCA, DES placed.  . Cholelithiasis 08/22/2013  . Chronic diastolic CHF (congestive heart failure) ([place])    a. 03/2016 Echo: EF 55-60%, no rwma, Gr2 DD.  . Diabetes  mellitus   . Dyspnea   . Gross hematuria   . H/O mechanical aortic valve replacement    a. 03/2016 Echo: AoV mean gradient 72mmHg, Valve Area (VTI) 1.36 cm^2, (Vmax) 1.22 cm^2, mod dil LA.  [name] Hyperlipidemia   . Hypertension   . NEPHROLITHIASIS, HX OF   . Obesity   . PAF (paroxysmal atrial fibrillation) ([place])    a. H/o such, with recurrence of coarse afib/flutter during ER consult 03/2012.  . Stroke ([place])   . TRANSIENT ISCHEMIC ATTACK, HX OF    a. In 2004.  [name] Unspecified vitamin D deficiency     Past Surgical History:  Procedure Laterality Date  . AORTIC VALVE REPLACEMENT  2000  . APPENDECTOMY  1998  . CORONARY ARTERY BYPASS GRAFT  2000  . CORONARY STENT INTERVENTION N/A 10/23/2017   Procedure: CORONARY STENT INTERVENTION;  Surgeon: [name], MD;  Location: [facility];  Service: Cardiovascular;  Laterality: N/A;  . LEFT AND RIGHT HEART CATHETERIZATION WITH CORONARY ANGIOGRAM N/A 07/19/2013   Procedure: LEFT AND RIGHT HEART CATHETERIZATION WITH CORONARY ANGIOGRAM;  Surgeon: [name], MD;  Location: [facility] CATH LAB;  Service: Cardiovascular;  Laterality: N/A;  . LUMBAR LAMINECTOMY/DECOMPRESSION MICRODISCECTOMY Right 06/06/2018   Procedure: Right L3-4 disectomy;  Surgeon: Melina Schools, MD;  Location: [facility];  Service: Orthopedics;  Laterality: Right;  2.5 hrs  . RIGHT HEART CATH AND CORONARY/GRAFT ANGIOGRAPHY N/A 10/23/2017   Procedure: RIGHT HEART CATH AND CORONARY/GRAFT ANGIOGRAPHY;  Surgeon: [name], MD;  Location: [facility];  Service: Cardiovascular;  Laterality: N/A;  .  TEE WITHOUT CARDIOVERSION N/A 07/22/2013   Procedure: TRANSESOPHAGEAL ECHOCARDIOGRAM (TEE);  Surgeon: [name], MD;  Location: [facility] ENDOSCOPY;  Service: Cardiovascular;  Laterality: N/A;    Family History  Problem Relation Age of Onset  . Coronary artery disease Mother   . Hypertension Mother   . Diabetes Brother   . Coronary artery disease Other 63       male 1st  degree relative, CABG in his 67's (father)  . Cancer Father        ? lung    Social History:  reports that he has never smoked. He has never used smokeless tobacco. He reports that he does not drink alcohol or use drugs.  Allergies:  Allergies  Allergen Reactions  . Diltiazem Hives  . Insulin Glargine Swelling    edema    Medications: I have reviewed the patient's current medications.  Results for orders placed or performed during the hospital encounter of 09/08/19 (from the past 48 hour(s))  Glucose, capillary     Status: Abnormal   Collection Time: 09/11/19  4:16 PM  Result Value Ref Range   Glucose-Capillary 225 (H) 70 - 99 mg/dL    Comment: Glucose reference range applies only to samples taken after fasting for at least 8 hours.  Glucose, capillary     Status: Abnormal   Collection Time: 09/11/19  9:10 PM  Result Value Ref Range   Glucose-Capillary 159 (H) 70 - 99 mg/dL    Comment: Glucose reference range applies only to samples taken after fasting for at least 8 hours.  Protime-INR     Status: Abnormal   Collection Time: 09/12/19  5:35 AM  Result Value Ref Range   Prothrombin Time 31.0 (H) 11.4 - 15.2 seconds   INR 3.0 (H) 0.8 - 1.2    Comment: (NOTE) INR goal varies based on device and disease states. Performed at [facility], [place] [address], [place], [place] [id number]   Basic metabolic panel     Status: Abnormal   Collection Time: 09/12/19  5:35 AM  Result Value Ref Range   Sodium 137 135 - 145 mmol/L   Potassium 3.7 3.5 - 5.1 mmol/L   Chloride 107 98 - 111 mmol/L   CO2 22 22 - 32 mmol/L   Glucose, Bld 199 (H) 70 - 99 mg/dL    Comment: Glucose reference range applies only to samples taken after fasting for at least 8 hours.   BUN 13 6 - 20 mg/dL   Creatinine, Ser 1.17 0.61 - 1.24 mg/dL   Calcium 8.7 (L) 8.9 - 10.3 mg/dL   GFR calc non Af Amer >60 >60 mL/min   GFR calc Af Amer >60 >60 mL/min   Anion gap 8 5 - 15    Comment: Performed at [facility] [address], [place], [place] [address]  CBC with Differential/Platelet     Status: Abnormal   Collection Time: 09/12/19  5:35 AM  Result Value Ref Range   WBC 10.7 (H) 4.0 - 10.5 K/uL   RBC 3.69 (L) 4.22 - 5.81 MIL/uL   Hemoglobin 10.6 (L) 13.0 - 17.0 g/dL   HCT 29.7 (L) 39.0 - 52.0 %   MCV 80.5 80.0 - 100.0 fL   MCH 28.7 26.0 - 34.0 pg   MCHC 35.7 30.0 - 36.0 g/dL   RDW 14.2 11.5 - 15.5 %   Platelets 252 150 - 400 K/uL   nRBC 0.0 0.0 - 0.2 %  Neutrophils Relative % 76 %   Neutro Abs 8.1 (H) 1.7 - 7.7 K/uL   Lymphocytes Relative 13 %   Lymphs Abs 1.4 0.7 - 4.0 K/uL   Monocytes Relative 8 %   Monocytes Absolute 0.9 0.1 - 1.0 K/uL   Eosinophils Relative 2 %   Eosinophils Absolute 0.2 0.0 - 0.5 K/uL   Basophils Relative 0 %   Basophils Absolute 0.0 0.0 - 0.1 K/uL   Immature Granulocytes 1 %   Abs Immature Granulocytes 0.09 (H) 0.00 - 0.07 K/uL    Comment: Performed at [facility] [address], [place], [place] [address]  Magnesium     Status: None   Collection Time: 09/12/19  5:35 AM  Result Value Ref Range   Magnesium 1.7 1.7 - 2.4 mg/dL    Comment: Performed at [facility] [address], [place], [place] [address]  Phosphorus     Status: None   Collection Time: 09/12/19  5:35 AM  Result Value Ref Range   Phosphorus 2.7 2.5 - 4.6 mg/dL    Comment: Performed at [facility] [address] [place], [place] [address]  Glucose, capillary     Status: Abnormal   Collection Time: 09/12/19  7:37 AM  Result Value Ref Range   Glucose-Capillary 174 (H) 70 - 99 mg/dL    Comment: Glucose reference range applies only to samples taken after fasting for at least 8 hours.  Glucose, capillary     Status: Abnormal   Collection Time: 09/12/19 11:29 AM  Result Value Ref Range   Glucose-Capillary 271 (H) 70 - 99 mg/dL    Comment: Glucose reference range applies only to samples taken after fasting for at least 8 hours.  Glucose, capillary     Status:  Abnormal   Collection Time: 09/12/19  4:45 PM  Result Value Ref Range   Glucose-Capillary 157 (H) 70 - 99 mg/dL    Comment: Glucose reference range applies only to samples taken after fasting for at least 8 hours.  Glucose, capillary     Status: Abnormal   Collection Time: 09/12/19  9:15 PM  Result Value Ref Range   Glucose-Capillary 184 (H) 70 - 99 mg/dL    Comment: Glucose reference range applies only to samples taken after fasting for at least 8 hours.   Comment 1 Notify RN    Comment 2 Document in Chart   Protime-INR     Status: Abnormal   Collection Time: 09/13/19  4:24 AM  Result Value Ref Range   Prothrombin Time 27.9 (H) 11.4 - 15.2 seconds   INR 2.6 (H) 0.8 - 1.2    Comment: (NOTE) INR goal varies based on device and disease states. Performed at [facility], [place] [address] [place], [place] [id number]   Basic metabolic panel     Status: Abnormal   Collection Time: 09/13/19  4:24 AM  Result Value Ref Range   Sodium 138 135 - 145 mmol/L   Potassium 3.7 3.5 - 5.1 mmol/L   Chloride 107 98 - 111 mmol/L   CO2 23 22 - 32 mmol/L   Glucose, Bld 160 (H) 70 - 99 mg/dL    Comment: Glucose reference range applies only to samples taken after fasting for at least 8 hours.   BUN 13 6 - 20 mg/dL   Creatinine, Ser 1.01 0.61 - 1.24 mg/dL   Calcium 8.4 (L) 8.9 - 10.3 mg/dL   GFR calc non Af Amer >  60 >60 mL/min   GFR calc Af Amer >60 >60 mL/min   Anion gap 8 5 - 15    Comment: Performed at [facility] [address], [place], [place] [address]  CBC with Differential/Platelet     Status: Abnormal   Collection Time: 09/13/19  4:24 AM  Result Value Ref Range   WBC 7.7 4.0 - 10.5 K/uL   RBC 3.14 (L) 4.22 - 5.81 MIL/uL   Hemoglobin 8.9 (L) 13.0 - 17.0 g/dL   HCT 25.4 (L) 39.0 - 52.0 %   MCV 80.9 80.0 - 100.0 fL   MCH 28.3 26.0 - 34.0 pg   MCHC 35.0 30.0 - 36.0 g/dL   RDW 14.2 11.5 - 15.5 %   Platelets 200 150 - 400 K/uL   nRBC 0.0 0.0 - 0.2 %   Neutrophils Relative %  73 %   Neutro Abs 5.7 1.7 - 7.7 K/uL   Lymphocytes Relative 14 %   Lymphs Abs 1.1 0.7 - 4.0 K/uL   Monocytes Relative 10 %   Monocytes Absolute 0.8 0.1 - 1.0 K/uL   Eosinophils Relative 2 %   Eosinophils Absolute 0.2 0.0 - 0.5 K/uL   Basophils Relative 0 %   Basophils Absolute 0.0 0.0 - 0.1 K/uL   Immature Granulocytes 1 %   Abs Immature Granulocytes 0.04 0.00 - 0.07 K/uL    Comment: Performed at [facility] [address] [place], [place] [address]  Magnesium     Status: None   Collection Time: 09/13/19  4:24 AM  Result Value Ref Range   Magnesium 1.7 1.7 - 2.4 mg/dL    Comment: Performed at [facility] [address], [place], [place] [address]  Phosphorus     Status: None   Collection Time: 09/13/19  4:24 AM  Result Value Ref Range   Phosphorus 3.3 2.5 - 4.6 mg/dL    Comment: Performed at [facility] [address] [place], [place] [address]  Hepatic function panel     Status: Abnormal   Collection Time: 09/13/19  4:24 AM  Result Value Ref Range   Total Protein 6.5 6.5 - 8.1 g/dL   Albumin 2.3 (L) 3.5 - 5.0 g/dL   AST 19 15 - 41 U/L   ALT 12 0 - 44 U/L   Alkaline Phosphatase 64 38 - 126 U/L   Total Bilirubin 0.9 0.3 - 1.2 mg/dL   Bilirubin, Direct 0.1 0.0 - 0.2 mg/dL   Indirect Bilirubin 0.8 0.3 - 0.9 mg/dL    Comment: Performed at [facility] [address], [place], [place] [address]  Glucose, capillary     Status: Abnormal   Collection Time: 09/13/19  7:28 AM  Result Value Ref Range   Glucose-Capillary 153 (H) 70 - 99 mg/dL    Comment: Glucose reference range applies only to samples taken after fasting for at least 8 hours.  Glucose, capillary     Status: Abnormal   Collection Time: 09/13/19 11:49 AM  Result Value Ref Range   Glucose-Capillary 217 (H) 70 - 99 mg/dL    Comment: Glucose reference range applies only to samples taken after fasting for at least 8 hours.    DG Orthopantogram  Result Date: 09/11/2019 CLINICAL DATA:   Pre-op testing. Molar pain. EXAM: ORTHOPANTOGRAM/PANORAMIC COMPARISON:  None. FINDINGS: Several teeth are absent. There is typical midline artifact of the Panorex technique. Significant dental caries involving the right maxillary wisdom tooth and  the most posterior remaining left mandibular molar. That tooth also demonstrates significant periodontal disease. No evidence of acute fracture or dislocation. IMPRESSION: Dental caries and periodontal disease as described. Electronically Signed   By: Richardean Sale M.D.   On: 09/11/2019 18:04   MR THORACIC SPINE W WO CONTRAST  Result Date: 09/12/2019 CLINICAL DATA:  Bone lesion EXAM: MRI THORACIC WITHOUT AND WITH CONTRAST TECHNIQUE: Multiplanar and multiecho pulse sequences of the thoracic spine were obtained without and with intravenous contrast. CONTRAST:  63mL GADAVIST GADOBUTROL 1 MMOL/ML IV SOLN COMPARISON:  CTA chest/abdomen/pelvis 09/12/2019 FINDINGS: MRI THORACIC SPINE FINDINGS The examination is intermittently motion degraded. No scout localizer imaging is provided. Spinal numbering is ascertained by numbering caudally from the C2 level on same-day CTA chest/abdomen/pelvis. Alignment: No significant spondylolisthesis. Vertebrae: There is mild loss of T10 vertebral body height. Vertebral body height is otherwise maintained. There is prominent STIR hyperintensity and enhancement within the central and right aspect of the right T10 vertebral body also extending partly into the right T10 pedicle and transverse process. The mass extends into the right paravertebral soft tissues, with this component measuring 2.1 x 1.7 x 2.6 cm (AP x TV x CC) (series 15, image 54) (series 7, image 2). As demonstrated on CT performed earlier the same day, there is involvement of the head of the right tenth rib. The T10 lesion is slightly expansile and there is a convex posterior vertebral margin. There are multiple additional subcentimeter foci of enhancement within the thoracic  spine which are indeterminate. These are present within the T7, T8 and T12 vertebrae. Cord: No spinal cord signal abnormality or abnormal cord enhancement. No abnormal enhancement is identified within the spinal canal. Paraspinal and other soft tissues: Right paraspinal extension of a T10 vertebral body mass as described above. The paraspinal soft tissues are otherwise unremarkable. Disc levels: No more than mild disc degeneration at any level. At the T10 level, bony expansion and facet hypertrophy contribute to severe spinal canal stenosis with near complete effacement of the thecal sac. There may be mild cord impingement. Bony expansion and facet arthrosis also contributes to moderate/severe right neural foraminal narrowing at T10-T11. At the remaining thoracic levels, there is no focal disc herniation. There is multilevel facet arthrosis and ligamentum flavum hypertrophy which is greatest in the lower thoracic spine. No more than mild spinal canal narrowing at these remaining levels. No significant foraminal narrowing at the remaining levels. IMPRESSION: Again demonstrated is an aggressive appearing mass centered within the T10 vertebral body also extending into the right T10 pedicle and articular pillar. There is right paraspinal extension of this mass with this component measuring 2.6 cm and involving the head of the right tenth rib. There is mild loss of T10 vertebral body height. Primary differential considerations include plasmacytoma, metastatic disease, osteoblastoma or other aggressive primary bone lesion. Consider direct tissue sampling. Posterior bony expansion and facet hypertrophy contribute to severe spinal canal stenosis at the T10 level with near complete effacement of the thecal sac. Moderate/severe right T10-T11 neural foraminal narrowing. There are subcentimeter foci of enhancement within the T7, T8 and T12 vertebrae which are indeterminate. Electronically Signed   By: Kellie Simmering DO   On:  09/12/2019 20:42   CT ANGIO CHEST AORTA W/CM & OR WO/CM  Result Date: 09/12/2019 CLINICAL DATA:  54 year old male under preoperative evaluation prior to potential redo Bentall procedure. EXAM: CT ANGIOGRAPHY CHEST, ABDOMEN AND PELVIS TECHNIQUE: Non-contrast CT of the chest was initially obtained. Multidetector CT imaging through  the chest, abdomen and pelvis was performed using the standard protocol during bolus administration of intravenous contrast. Multiplanar reconstructed images and MIPs were obtained and reviewed to evaluate the vascular anatomy. CONTRAST:  157mL OMNIPAQUE IOHEXOL 350 MG/ML SOLN COMPARISON:  Chest CTA 10/09/2014. CTA of the chest, abdomen and pelvis 07/24/2013. FINDINGS: CTA CHEST FINDINGS Cardiovascular: Heart size is enlarged. There is no significant pericardial fluid, thickening or pericardial calcification. 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 There is a small defect on the undersurface of the mechanical aortic valve best appreciated on coronal image 82 of series 8 measuring approximately 8 mm, which may represent valve associated thrombus and/or vegetation. The aortic arch is mildly aneurysmal measuring up to 4.3 cm in diameter. There is a residual dissection flap in the proximal and mid aortic arch which does not propagate into the great vessels. Descending thoracic aorta is normal in caliber measuring 2.8 cm in diameter. Bovine type thoracic aortic arch incidentally noted. There is a right coronary artery bypass graft. Large filling defect in the superior aspect of the right atrium immediately posterior to the septal leaflet of the tricuspid valve, and intimately associated with the adjacent aortic root, estimated to measure approximately 2.2 x 2.2 x 1.7 cm (axial image 83 of series 6 and coronal image 73 of series 8), presumably a vegetation. Mediastinum/Nodes: No pathologically enlarged mediastinal or  hilar lymph nodes. Esophagus is unremarkable in appearance. No axillary lymphadenopathy. Lungs/Pleura: No suspicious pulmonary nodules or masses are noted. No acute consolidative airspace disease. No pleural effusions. Musculoskeletal: In the T10 vertebral body there is a large lucent lesion with extension of soft tissue into the right paravertebral region and involvement of the head of the right tenth rib overall estimated to measure approximately 4.9 x 3.4 x 2.0 cm (axial image 105 of series 6 and sagittal image 117 of series 9). This causes significant narrowing of the central spinal canal (approximately 5 mm AP) at this level. Median sternotomy wires. Review of the MIP images confirms the above findings. CTA ABDOMEN AND PELVIS FINDINGS VASCULAR Aorta: Mild aortic atherosclerosis. Normal caliber aorta without aneurysm, dissection, vasculitis or significant stenosis. Celiac: Patent without evidence of aneurysm, dissection, vasculitis or significant stenosis. SMA: Patent without evidence of aneurysm, dissection, vasculitis or significant stenosis. Renals: Both renal arteries are patent without evidence of aneurysm, dissection, vasculitis, fibromuscular dysplasia or significant stenosis. IMA: Patent without evidence of aneurysm, dissection, vasculitis or significant stenosis. Inflow: Patent without evidence of aneurysm, dissection, vasculitis or significant stenosis. Veins: No obvious venous abnormality within the limitations of this arterial phase study. Review of the MIP images confirms the above findings. NON-VASCULAR Hepatobiliary: No suspicious appearing cystic or solid hepatic lesions are confidently identified on today's arterial phase examination. No intra or extrahepatic biliary ductal dilatation. Several partially calcified gallstones are noted measuring up to 1.6 cm in diameter. Gallbladder is otherwise unremarkable in appearance. Pancreas: No pancreatic mass. No pancreatic ductal dilatation. No  pancreatic or peripancreatic fluid collections or inflammatory changes. Spleen: Unremarkable. Adrenals/Urinary Tract: Bilateral kidneys and adrenal glands are normal in appearance. No hydroureteronephrosis. Urinary bladder is normal in appearance. Stomach/Bowel: Normal appearance of the stomach. No pathologic dilatation of small bowel or colon. The appendix is not confidently identified and may be surgically absent. Regardless, there are no inflammatory changes noted adjacent to the cecum to suggest the presence of an acute appendicitis at this time. Lymphatic: No lymphadenopathy noted in the abdomen or pelvis. Reproductive: Prostate  gland and seminal vesicles are unremarkable in appearance. Other: No significant volume of ascites.  No pneumoperitoneum. Musculoskeletal: There are no aggressive appearing lytic or blastic lesions noted in the visualized portions of the skeleton. Review of the MIP images confirms the above findings. IMPRESSION: 1. 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 2. Small lesions associated with the undersurface of the mechanical aortic valve and the superior aspect of the septal leaflet of the tricuspid valve, which may reflect valve associated thrombus and/or vegetations, as detailed above. 3. Aggressive appearing lesion in T10 vertebral body with extends in into the right paravertebral soft tissues and involvement of the head of the right tenth rib, with marked narrowing of the central spinal canal at this level, as detailed above. Primary differential considerations include a plasmacytoma, or less likely and osteoblastoma or solitary metastatic lesion (no primary lesion is confidently identified). If there is clinical concern for spinal cord compression, the full extent of this lesion could be better evaluated with thoracic  spine MRI with and without IV gadolinium. 4. Bovine type thoracic aortic arch (normal anatomical variant) incidentally noted. 5. Cholelithiasis without evidence of acute cholecystitis at this time. 6. Additional incidental findings, as above. These results will be called to the ordering clinician or representative by the Radiologist Assistant, and communication documented in the PACS or [employer]. Electronically Signed   By: Vinnie Langton M.D.   On: 09/12/2019 13:02   VAS US DOPPLER PRE CABG  Result Date: 09/12/2019 PREOPERATIVE VASCULAR EVALUATION  Indications:      Pre-CABG. Risk Factors:     Hypertension, hyperlipidemia, Diabetes. Comparison Study: No prior studies. Performing Technologist: Carlos Levering Rvt  Examination Guidelines: A complete evaluation includes B-mode imaging, spectral Doppler, color Doppler, and power Doppler as needed of all accessible portions of each vessel. Bilateral testing is considered an integral part of a complete examination. Limited examinations for reoccurring indications may be performed as noted.  Right Carotid Findings: +----------+--------+--------+--------+-----------------------+--------+           PSV cm/sEDV cm/sStenosisDescribe               Comments +----------+--------+--------+--------+-----------------------+--------+ CCA Prox  157     16              smooth and heterogenoustortuous +----------+--------+--------+--------+-----------------------+--------+ CCA Distal46      13              smooth and heterogenous         +----------+--------+--------+--------+-----------------------+--------+ ICA Prox  50      16              smooth and heterogenous         +----------+--------+--------+--------+-----------------------+--------+ ICA Distal42      18                                     tortuous +----------+--------+--------+--------+-----------------------+--------+ ECA       74      8                                                +----------+--------+--------+--------+-----------------------+--------+ Portions of this table do not appear on this page. +----------+--------+-------+--------+------------+  PSV cm/sEDV cmsDescribeArm Pressure +----------+--------+-------+--------+------------+ Subclavian81                     134          +----------+--------+-------+--------+------------+ +---------+--------+--+--------+--+---------+ VertebralPSV cm/s33EDV cm/s10Antegrade +---------+--------+--+--------+--+---------+ Left Carotid Findings: +----------+--------+--------+--------+-----------------------+--------+           PSV cm/sEDV cm/sStenosisDescribe               Comments +----------+--------+--------+--------+-----------------------+--------+ CCA Prox  65      16              smooth and heterogenous         +----------+--------+--------+--------+-----------------------+--------+ CCA Distal62      9               smooth and heterogenous         +----------+--------+--------+--------+-----------------------+--------+ ICA Prox  40      17                                     tortuous +----------+--------+--------+--------+-----------------------+--------+ ICA Distal56      29                                     tortuous +----------+--------+--------+--------+-----------------------+--------+ ECA       45      6                                               +----------+--------+--------+--------+-----------------------+--------+ +----------+--------+--------+--------+------------+ SubclavianPSV cm/sEDV cm/sDescribeArm Pressure +----------+--------+--------+--------+------------+           71                      136          +----------+--------+--------+--------+------------+ +---------+--------+--+--------+-+---------+ VertebralPSV cm/s38EDV cm/s9Antegrade +---------+--------+--+--------+-+---------+  ABI Findings:  +--------+------------------+-----+---------+--------+ Right   Rt Pressure (mmHg)IndexWaveform Comment  +--------+------------------+-----+---------+--------+ [id number]                    triphasic         +--------+------------------+-----+---------+--------+ PTA     147               1.08 triphasic         +--------+------------------+-----+---------+--------+ DP      176               1.29 triphasic         +--------+------------------+-----+---------+--------+ +--------+------------------+-----+---------+-------+ Left    Lt Pressure (mmHg)IndexWaveform Comment +--------+------------------+-----+---------+-------+ [id number]                    triphasic        +--------+------------------+-----+---------+-------+ PTA     122               0.90 triphasic        +--------+------------------+-----+---------+-------+ DP      155               1.14 triphasic        +--------+------------------+-----+---------+-------+ +-------+---------------+----------------+ ABI/TBIToday's ABI/TBIPrevious ABI/TBI +-------+---------------+----------------+ Right  1.29                            +-------+---------------+----------------+ Left   1.14                            +-------+---------------+----------------+  Right Doppler Findings: +--------+--------+-----+---------+--------+ Site    PressureIndexDoppler  Comments +--------+--------+-----+---------+--------+ [id number]          triphasic         +--------+--------+-----+---------+--------+ Radial               triphasic         +--------+--------+-----+---------+--------+ Ulnar                triphasic         +--------+--------+-----+---------+--------+  Left Doppler Findings: +--------+--------+-----+---------+--------+ Site    PressureIndexDoppler  Comments +--------+--------+-----+---------+--------+ [id number]          triphasic          +--------+--------+-----+---------+--------+ Radial               triphasic         +--------+--------+-----+---------+--------+ Ulnar                triphasic         +--------+--------+-----+---------+--------+  Summary: Right Carotid: Velocities in the right ICA are consistent with a 1-39% stenosis. Left Carotid: Velocities in the left ICA are consistent with a 1-39% stenosis. Vertebrals: Bilateral vertebral arteries demonstrate antegrade flow. Right ABI: Resting right ankle-brachial index is within normal range. No evidence of significant right lower extremity arterial disease. Left ABI: Resting left ankle-brachial index is within normal range. No evidence of significant left lower extremity arterial disease. Right Upper Extremity: Doppler waveforms decrease >50% with right radial compression. Doppler waveform obliterate with right ulnar compression. Left Upper Extremity: Doppler waveform obliterate with left radial compression. Doppler waveforms remain within normal limits with left ulnar compression.  Electronically signed by Monica Martinez MD on 09/12/2019 at 8:06:35 PM.    Final    CT Angio Abd/Pel w/ and/or w/o  Result Date: 09/12/2019 CLINICAL DATA:  54 year old male under preoperative evaluation prior to potential redo Bentall procedure. EXAM: CT ANGIOGRAPHY CHEST, ABDOMEN AND PELVIS TECHNIQUE: Non-contrast CT of the chest was initially obtained. Multidetector CT imaging through the chest, abdomen and pelvis was performed using the standard protocol during bolus administration of intravenous contrast. Multiplanar reconstructed images and MIPs were obtained and reviewed to evaluate the vascular anatomy. CONTRAST:  139mL OMNIPAQUE IOHEXOL 350 MG/ML SOLN COMPARISON:  Chest CTA 10/09/2014. CTA of the chest, abdomen and pelvis 07/24/2013. FINDINGS: CTA CHEST FINDINGS Cardiovascular: Heart size is enlarged. There is no significant pericardial fluid, thickening or pericardial calcification.  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 There is a small defect on the undersurface of the mechanical aortic valve best appreciated on coronal image 82 of series 8 measuring approximately 8 mm, which may represent valve associated thrombus and/or vegetation. The aortic arch is mildly aneurysmal measuring up to 4.3 cm in diameter. There is a residual dissection flap in the proximal and mid aortic arch which does not propagate into the great vessels. Descending thoracic aorta is normal in caliber measuring 2.8 cm in diameter. Bovine type thoracic aortic arch incidentally noted. There is a right coronary artery bypass graft. Large filling defect in the superior aspect of the right atrium immediately posterior to the septal leaflet of the tricuspid valve, and intimately associated with the adjacent aortic root, estimated to measure approximately 2.2 x 2.2 x 1.7 cm (axial image 83 of series 6 and coronal image 73 of series 8), presumably a vegetation. Mediastinum/Nodes: No pathologically enlarged mediastinal or hilar lymph nodes. Esophagus is unremarkable in appearance. No axillary lymphadenopathy. Lungs/Pleura: No suspicious  pulmonary nodules or masses are noted. No acute consolidative airspace disease. No pleural effusions. Musculoskeletal: In the T10 vertebral body there is a large lucent lesion with extension of soft tissue into the right paravertebral region and involvement of the head of the right tenth rib overall estimated to measure approximately 4.9 x 3.4 x 2.0 cm (axial image 105 of series 6 and sagittal image 117 of series 9). This causes significant narrowing of the central spinal canal (approximately 5 mm AP) at this level. Median sternotomy wires. Review of the MIP images confirms the above findings. CTA ABDOMEN AND PELVIS FINDINGS VASCULAR Aorta: Mild aortic atherosclerosis. Normal caliber aorta without aneurysm, dissection,  vasculitis or significant stenosis. Celiac: Patent without evidence of aneurysm, dissection, vasculitis or significant stenosis. SMA: Patent without evidence of aneurysm, dissection, vasculitis or significant stenosis. Renals: Both renal arteries are patent without evidence of aneurysm, dissection, vasculitis, fibromuscular dysplasia or significant stenosis. IMA: Patent without evidence of aneurysm, dissection, vasculitis or significant stenosis. Inflow: Patent without evidence of aneurysm, dissection, vasculitis or significant stenosis. Veins: No obvious venous abnormality within the limitations of this arterial phase study. Review of the MIP images confirms the above findings. NON-VASCULAR Hepatobiliary: No suspicious appearing cystic or solid hepatic lesions are confidently identified on today's arterial phase examination. No intra or extrahepatic biliary ductal dilatation. Several partially calcified gallstones are noted measuring up to 1.6 cm in diameter. Gallbladder is otherwise unremarkable in appearance. Pancreas: No pancreatic mass. No pancreatic ductal dilatation. No pancreatic or peripancreatic fluid collections or inflammatory changes. Spleen: Unremarkable. Adrenals/Urinary Tract: Bilateral kidneys and adrenal glands are normal in appearance. No hydroureteronephrosis. Urinary bladder is normal in appearance. Stomach/Bowel: Normal appearance of the stomach. No pathologic dilatation of small bowel or colon. The appendix is not confidently identified and may be surgically absent. Regardless, there are no inflammatory changes noted adjacent to the cecum to suggest the presence of an acute appendicitis at this time. Lymphatic: No lymphadenopathy noted in the abdomen or pelvis. Reproductive: Prostate gland and seminal vesicles are unremarkable in appearance. Other: No significant volume of ascites.  No pneumoperitoneum. Musculoskeletal: There are no aggressive appearing lytic or blastic lesions noted in the  visualized portions of the skeleton. Review of the MIP images confirms the above findings. IMPRESSION: 1. 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 2. Small lesions associated with the undersurface of the mechanical aortic valve and the superior aspect of the septal leaflet of the tricuspid valve, which may reflect valve associated thrombus and/or vegetations, as detailed above. 3. Aggressive appearing lesion in T10 vertebral body with extends in into the right paravertebral soft tissues and involvement of the head of the right tenth rib, with marked narrowing of the central spinal canal at this level, as detailed above. Primary differential considerations include a plasmacytoma, or less likely and osteoblastoma or solitary metastatic lesion (no primary lesion is confidently identified). If there is clinical concern for spinal cord compression, the full extent of this lesion could be better evaluated with thoracic spine MRI with and without IV gadolinium. 4. Bovine type thoracic aortic arch (normal anatomical variant) incidentally noted. 5. Cholelithiasis without evidence of acute cholecystitis at this time. 6. Additional incidental findings, as above. These results will be called to the ordering clinician or representative by the Radiologist Assistant, and communication documented in the PACS or [employer]. Electronically Signed   By: Quillian Quince  Entrikin M.D.   On: 09/12/2019 13:02    Review of Systems  Constitutional: Positive for appetite change, chills, fatigue and unexpected weight change.  HENT: Negative.   Eyes: Negative.   Respiratory: Negative.   Cardiovascular: Negative.   Gastrointestinal: Negative.   Endocrine: Negative.   Genitourinary: Negative.   Musculoskeletal: Positive for back pain. Negative for arthralgias, gait problem  and myalgias.  Skin: Negative.   Allergic/Immunologic: Negative.   Neurological: Negative for dizziness, syncope, weakness and numbness.  Hematological: Negative.   Psychiatric/Behavioral: Negative.    Blood pressure 130/79, pulse 94, temperature 98.4 F (36.9 C), temperature source Oral, resp. rate 16, height 6\' 2"  (1.88 m), weight 129.1 kg, SpO2 100 %. Physical Exam  Constitutional: He is oriented to person, place, and time. He appears well-developed and well-nourished.  HENT:  Head: Normocephalic and atraumatic.  Eyes: Pupils are equal, round, and reactive to light. Conjunctivae and EOM are normal.  Cardiovascular: Normal rate.  Murmur heard. Click heard  Respiratory: Effort normal. No respiratory distress.  GI: Soft. He exhibits no distension. There is no abdominal tenderness.  Musculoskeletal:        General: Normal range of motion.       Arms:     Cervical back: Normal range of motion and neck supple.  Neurological: He is alert and oriented to person, place, and time. No cranial nerve deficit.  Skin: Skin is warm and dry.  Psychiatric: He has a normal mood and affect. His behavior is normal.    Assessment/Plan: Patient admitted for infective endocarditis. He is to undergo dental extractions and valve replacement in the next week. Cardiothoracic imaging incidentally revealed a lesion within the vertebra of T10. A thoracic MRI shows some early spinal stenosis without frank cord compression. 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 An order has been placed for a bone biopsy by interventional radiology. Further recommendations for Mr. Donegan's plan of care in regards to his T10 vertebral body lesion will be made once the pathology comes back.  Patricia Nettle 09/13/2019, 3:53 PM

## 2019-09-13 NOTE — Progress Notes (Signed)
[place] for warfarin>>heparin Indication: atrial fibrillation + mechanical AVR  Allergies  Allergen Reactions  . Diltiazem Hives  . Insulin Glargine Swelling    edema    Patient Measurements: Height: 6\' 2"  (188 cm) Weight: 129.1 kg (284 lb 9.6 oz) IBW/kg (Calculated) : 82.2  Vital Signs: Temp: 99.6 F (37.6 C) (04/23 0330) Temp Source: Oral (04/23 0330) BP: 123/74 (04/23 0330) Pulse Rate: 79 (04/23 0330)  Labs: Recent Labs    09/11/19 0508 09/12/19 0535 09/13/19 0424  HGB  --  10.6* 8.9*  HCT  --  29.7* 25.4*  PLT  --  252 200  LABPROT 32.6* 31.0* 27.9*  INR 3.2* 3.0* 2.6*  CREATININE 1.27* 1.17 1.01    Estimated Creatinine Clearance: 119.4 mL/min (by C-G formula based on SCr of 1.01 mg/dL).   Medical History: Past Medical History:  Diagnosis Date  . Ascending aortic dissection ([place])    a. 2000 - with aortic valve involvement. S/p repair of aortic dissection with placement of mechanical AVR.  [name] CAD (coronary artery disease)    a. 2000 VG->PDA @ time of Ao dissection repair;  b. Cardiac CT 06/2010: no CAD, only mild plaque in prox RCA;  c. 2015 Cath: LM nl, LAD nl, LCX nl, RCA 100, VG->PDA 90 (4.5x12 Rebel BMS). d. 10/23/17 instent restenosis SVG to RCA, DES placed.  . Cholelithiasis 08/22/2013  . Chronic diastolic CHF (congestive heart failure) ([place])    a. 03/2016 Echo: EF 55-60%, no rwma, Gr2 DD.  . Diabetes mellitus   . Dyspnea   . Gross hematuria   . H/O mechanical aortic valve replacement    a. 03/2016 Echo: AoV mean gradient 19mmHg, Valve Area (VTI) 1.36 cm^2, (Vmax) 1.22 cm^2, mod dil LA.  [name] Hyperlipidemia   . Hypertension   . NEPHROLITHIASIS, HX OF   . Obesity   . PAF (paroxysmal atrial fibrillation) ([place])    a. H/o such, with recurrence of coarse afib/flutter during ER consult 03/2012.  . Stroke ([place])   . TRANSIENT ISCHEMIC ATTACK, HX OF    a. In 2004.  [name] Unspecified vitamin D deficiency    Assessment: 61  YOM presenting with SOB and N/V, on warfarin PTA for Afib and mechanical AVR. Vegetation seen on TTE, will need to hold warfarin and begin IV heparin with impending need for procedures.  INR this morning continues to be therapeutic at 2.6, CBC has been stable, however hgb down this am 10.6>8.9.   PTA dosing: 5mg  MWF, 7.5mg  all other days  Goal of Therapy:  INR 2.5-3.5 Monitor platelets by anticoagulation protocol: Yes   Plan:  Hold heparin for now and recheck INR this evening Begin heparin once < 2.5  Erin Hearing PharmD., BCPS Clinical Pharmacist 09/13/2019 12:13 PM

## 2019-09-14 ENCOUNTER — Inpatient Hospital Stay: Payer: Self-pay

## 2019-09-14 DIAGNOSIS — I079 Rheumatic tricuspid valve disease, unspecified: Secondary | ICD-10-CM

## 2019-09-14 DIAGNOSIS — I25810 Atherosclerosis of coronary artery bypass graft(s) without angina pectoris: Secondary | ICD-10-CM | POA: Diagnosis not present

## 2019-09-14 DIAGNOSIS — I38 Endocarditis, valve unspecified: Secondary | ICD-10-CM

## 2019-09-14 DIAGNOSIS — I480 Paroxysmal atrial fibrillation: Secondary | ICD-10-CM | POA: Diagnosis not present

## 2019-09-14 DIAGNOSIS — B955 Unspecified streptococcus as the cause of diseases classified elsewhere: Secondary | ICD-10-CM

## 2019-09-14 DIAGNOSIS — I330 Acute and subacute infective endocarditis: Secondary | ICD-10-CM | POA: Diagnosis not present

## 2019-09-14 LAB — BASIC METABOLIC PANEL
Anion gap: 9 (ref 5–15)
BUN: 15 mg/dL (ref 6–20)
CO2: 22 mmol/L (ref 22–32)
Calcium: 8.7 mg/dL — ABNORMAL LOW (ref 8.9–10.3)
Chloride: 105 mmol/L (ref 98–111)
Creatinine, Ser: 1.21 mg/dL (ref 0.61–1.24)
GFR calc Af Amer: >60 mL/min (ref >60)
GFR calc non Af Amer: >60 mL/min (ref >60)
Glucose, Bld: 249 mg/dL — ABNORMAL HIGH (ref 70–99)
Potassium: 3.8 mmol/L (ref 3.5–5.1)
Sodium: 136 mmol/L (ref 135–145)

## 2019-09-14 LAB — HEPATIC FUNCTION PANEL
ALT: 12 U/L (ref 0–44)
AST: 21 U/L (ref 15–41)
Albumin: 2.3 g/dL — ABNORMAL LOW (ref 3.5–5.0)
Alkaline Phosphatase: 70 U/L (ref 38–126)
Bilirubin, Direct: 0.2 mg/dL (ref 0.0–0.2)
Indirect Bilirubin: 0.6 mg/dL (ref 0.3–0.9)
Total Bilirubin: 0.8 mg/dL (ref 0.3–1.2)
Total Protein: 6.8 g/dL (ref 6.5–8.1)

## 2019-09-14 LAB — GLUCOSE, CAPILLARY
Glucose-Capillary: 225 mg/dL — ABNORMAL HIGH (ref 70–99)
Glucose-Capillary: 166 mg/dL — ABNORMAL HIGH (ref 70–99)
Glucose-Capillary: 107 mg/dL — ABNORMAL HIGH (ref 70–99)
Glucose-Capillary: 152 mg/dL — ABNORMAL HIGH (ref 70–99)

## 2019-09-14 LAB — CBC WITH DIFFERENTIAL/PLATELET
Abs Immature Granulocytes: 0.1 10*3/uL — ABNORMAL HIGH (ref 0.00–0.07)
Basophils Absolute: 0 10*3/uL (ref 0.0–0.1)
Basophils Relative: 0 %
Eosinophils Absolute: 0.2 10*3/uL (ref 0.0–0.5)
Eosinophils Relative: 2 %
HCT: 27.6 % — ABNORMAL LOW (ref 39.0–52.0)
Hemoglobin: 9.8 g/dL — ABNORMAL LOW (ref 13.0–17.0)
Immature Granulocytes: 1 %
Lymphocytes Relative: 13 %
Lymphs Abs: 1.2 10*3/uL (ref 0.7–4.0)
MCH: 28.8 pg (ref 26.0–34.0)
MCHC: 35.5 g/dL (ref 30.0–36.0)
MCV: 81.2 fL (ref 80.0–100.0)
Monocytes Absolute: 0.8 10*3/uL (ref 0.1–1.0)
Monocytes Relative: 8 %
Neutro Abs: 7 10*3/uL (ref 1.7–7.7)
Neutrophils Relative %: 76 %
Platelets: 230 10*3/uL (ref 150–400)
RBC: 3.4 MIL/uL — ABNORMAL LOW (ref 4.22–5.81)
RDW: 14.4 % (ref 11.5–15.5)
WBC: 9.2 10*3/uL (ref 4.0–10.5)
nRBC: 0 % (ref 0.0–0.2)

## 2019-09-14 LAB — PHOSPHORUS: Phosphorus: 3.7 mg/dL (ref 2.5–4.6)

## 2019-09-14 LAB — MAGNESIUM: Magnesium: 1.7 mg/dL (ref 1.7–2.4)

## 2019-09-14 LAB — PROTIME-INR
INR: 2.3 — ABNORMAL HIGH (ref 0.8–1.2)
Prothrombin Time: 25 seconds — ABNORMAL HIGH (ref 11.4–15.2)

## 2019-09-14 LAB — HEPARIN LEVEL (UNFRACTIONATED): Heparin Unfractionated: <0.1 IU/mL — ABNORMAL LOW (ref 0.30–0.70)

## 2019-09-14 MED ORDER — HEPARIN (PORCINE) 25000 UT/250ML-% IV SOLN
1950.0000 [IU]/h | INTRAVENOUS | Status: DC
  Administered 2019-09-14: 1500 [IU]/h via INTRAVENOUS
  Administered 2019-09-15: 1950 [IU]/h via INTRAVENOUS
  Administered 2019-09-15: 01:00:00 1800 [IU]/h via INTRAVENOUS
  Administered 2019-09-16: 05:00:00 1950 [IU]/h via INTRAVENOUS
  Filled 2019-09-14 (×4): qty 250

## 2019-09-14 MED ORDER — METRONIDAZOLE 500 MG PO TABS
500.0000 mg | ORAL_TABLET | Freq: Three times a day (TID) | ORAL | Status: DC
  Administered 2019-09-14 – 2019-09-19 (×15): 500 mg via ORAL
  Filled 2019-09-14 (×15): qty 1

## 2019-09-14 NOTE — Progress Notes (Signed)
[place] for warfarin>>heparin Indication: atrial fibrillation + mechanical AVR  Allergies  Allergen Reactions  . Diltiazem Hives  . Insulin Glargine Swelling    edema    Patient Measurements: Height: 6\' 2"  (188 cm) Weight: 129 kg (284 lb 6.3 oz) IBW/kg (Calculated) : 82.2  Vital Signs: Temp: 99.6 F (37.6 C) (04/24 1957) Temp Source: Oral (04/24 1957) BP: 124/76 (04/24 1957) Pulse Rate: 89 (04/24 1957)  Labs: Recent Labs    09/12/19 0535 09/12/19 0535 09/13/19 0424 09/13/19 1759 09/14/19 0339 09/14/19 0909 09/14/19 1812  HGB 10.6*   < > 8.9*  --   --  9.8*  --   HCT 29.7*  --  25.4*  --   --  27.6*  --   PLT 252  --  200  --   --  230  --   LABPROT 31.0*   < > 27.9* 26.6* 25.0*  --   --   INR 3.0*   < > 2.6* 2.5* 2.3*  --   --   HEPARINUNFRC  --   --   --   --   --   --  <0.10*  CREATININE 1.17  --  1.01  --   --  1.21  --    < > = values in this interval not displayed.    Estimated Creatinine Clearance: 99.6 mL/min (by C-G formula based on SCr of 1.21 mg/dL).   Medical History: Past Medical History:  Diagnosis Date  . Ascending aortic dissection ([place])    a. 2000 - with aortic valve involvement. S/p repair of aortic dissection with placement of mechanical AVR.  [name] CAD (coronary artery disease)    a. 2000 VG->PDA @ time of Ao dissection repair;  b. Cardiac CT 06/2010: no CAD, only mild plaque in prox RCA;  c. 2015 Cath: LM nl, LAD nl, LCX nl, RCA 100, VG->PDA 90 (4.5x12 Rebel BMS). d. 10/23/17 instent restenosis SVG to RCA, DES placed.  . Cholelithiasis 08/22/2013  . Chronic diastolic CHF (congestive heart failure) ([place])    a. 03/2016 Echo: EF 55-60%, no rwma, Gr2 DD.  . Diabetes mellitus   . Dyspnea   . Gross hematuria   . H/O mechanical aortic valve replacement    a. 03/2016 Echo: AoV mean gradient 89mmHg, Valve Area (VTI) 1.36 cm^2, (Vmax) 1.22 cm^2, mod dil LA.  [name] Hyperlipidemia   . Hypertension   . NEPHROLITHIASIS,  HX OF   . Obesity   . PAF (paroxysmal atrial fibrillation) ([place])    a. H/o such, with recurrence of coarse afib/flutter during ER consult 03/2012.  . Stroke ([place])   . TRANSIENT ISCHEMIC ATTACK, HX OF    a. In 2004.  [name] Unspecified vitamin D deficiency    Assessment: 51 YOM presenting with SOB and N/V, on warfarin PTA for Afib and mechanical AVR. Vegetation seen on TTE, will need to hold warfarin and begin IV heparin with impending need for procedures.  INR this morning continues to be therapeutic at 2.6, CBC has been stable, however hgb down this am 10.6>8.9.   PTA dosing: 5mg  MWF, 7.5mg  all other days  Heparin drip 1500 uts/hr HL undetectable  - will increase  Goal of Therapy:  Heparin level 0.3-0.7 units/mL INR 2.5-3.5 Monitor platelets by anticoagulation protocol: Yes   Plan:  Increase  heparin drip at 1800 units/hr Daily HL, CBC   Bonnita Nasuti Pharm.D. CPP, BCPS Clinical Pharmacist [phone] 09/14/2019 9:09 PM

## 2019-09-14 NOTE — Progress Notes (Signed)
Progress Note  Patient Name: [name] Date of Encounter: 09/14/2019  Primary Cardiologist: Kirk Ruths, MD   Subjective   No cardiovascular complaints today.  The abscess on his rear end has spontaneously ruptured and the area has been packed and covered with a dressing. Afebrile.  Poor appetite, no other complaints.  Inpatient Medications    Scheduled Meds: . [START ON 09/16/2019] aspirin  81 mg Oral Pre-Cath  . aspirin EC  81 mg Oral Daily  . atorvastatin  40 mg Oral Q breakfast  . gabapentin  300 mg Oral Q breakfast  . insulin aspart  0-15 Units Subcutaneous TID WC  . insulin aspart  0-5 Units Subcutaneous QHS  . insulin aspart  6 Units Subcutaneous TID WC  . insulin detemir  6 Units Subcutaneous BID  . levothyroxine  50 mcg Oral Q0600  . metroNIDAZOLE  500 mg Oral Q8H  . polyethylene glycol  17 g Oral BID  . sodium chloride flush  3 mL Intravenous Once  . sodium chloride flush  3 mL Intravenous Q12H   Continuous Infusions: . sodium chloride    . [START ON 09/16/2019] sodium chloride     Followed by  . [START ON 09/16/2019] sodium chloride    . cefTRIAXone (ROCEPHIN)  IV 2 g (09/14/19 1018)  . gentamicin 300 mg (09/13/19 2013)  . heparin 1,500 Units/hr (09/14/19 1038)   PRN Meds: sodium chloride, acetaminophen **OR** acetaminophen, nitroGLYCERIN, ondansetron **OR** ondansetron (ZOFRAN) IV, sodium chloride flush   Vital Signs    Vitals:   09/13/19 0500 09/13/19 1548 09/13/19 2039 09/14/19 0506  BP:  130/79 129/74 128/78  Pulse:  94 94 88  Resp:  _0 Temp:  98.4 F (36.9 C) 99.2 F (37.3 C) 98.8 F (37.1 C)  TempSrc:  Oral Oral Oral  SpO2:  100% 100% 98%  Weight: 129.1 kg   129 kg  Height:        Intake/Output Summary (Last 24 hours) at 09/14/2019 1423 Last data filed at 09/14/2019 0500 Gross per 24 hour  Intake 3 ml  Output 500 ml  Net -497 ml   Last 3 Weights 09/14/2019 09/13/2019 09/12/2019  Weight (lbs) 284 lb 6.3 oz 284 lb 9.6 oz 281  lb  Weight (kg) 129 kg 129.094 kg 127.461 kg      Telemetry    Sinus rhythm with occasional PACs- Personally Reviewed  ECG    No new tracing - Personally Reviewed  Physical Exam  Obese.  Comfortable lying fully flat in bed. GEN: No acute distress.   Neck: No JVD Cardiac: RRR, crisp prosthetic valve clicks, 1/6 early peaking systolic ejection murmur, no diastolic murmurs, rubs, or gallops.  Respiratory: Clear to auscultation bilaterally. GI: Soft, nontender, non-distended  MS: No edema; No deformity. Neuro:  Nonfocal  Psych: Normal affect   Labs    High Sensitivity Troponin:   Recent Labs  Lab 09/08/19 0810 09/08/19 1100  TROPONINIHS 34* 28*      Chemistry Recent Labs  Lab 09/09/19 0406 09/09/19 0406 09/10/19 0448 09/11/19 0508 09/12/19 0535 09/13/19 0424 09/14/19 0339 09/14/19 0909  NA 138   < > 137   < > 137 138  --  136  K 3.9   < > 3.5   < > 3.7 3.7  --  3.8  CL 104   < > 104   < > 107 107  --  105  CO2 24   < > 23   < >  22 23  --  22  GLUCOSE 187*   < > 181*   < > 199* 160*  --  249*  BUN 13   < > 16   < > 13 13  --  15  CREATININE 1.11   < > 1.50*   < > 1.17 1.01  --  1.21  CALCIUM 8.9   < > 8.6*   < > 8.7* 8.4*  --  8.7*  PROT 7.2  --   --   --   --  6.5 6.8  --   ALBUMIN 2.5*   < > 2.6*  --   --  2.3* 2.3*  --   AST 23  --   --   --   --  19 21  --   ALT 15  --   --   --   --  12 12  --   ALKPHOS 80  --   --   --   --  64 70  --   BILITOT 1.1  --   --   --   --  0.9 0.8  --   GFRNONAA >60   < > 52*   < > >60 >60  --  >60  GFRAA >60   < > >60   < > >60 >60  --  >60  ANIONGAP 10   < > 10   < > 8 8  --  9   < > = values in this interval not displayed.     Hematology Recent Labs  Lab 09/12/19 0535 09/13/19 0424 09/14/19 0909  WBC 10.7* 7.7 9.2  RBC 3.69* 3.14* 3.40*  HGB 10.6* 8.9* 9.8*  HCT 29.7* 25.4* 27.6*  MCV 80.5 80.9 81.2  MCH 28.7 28.3 28.8  MCHC 35.7 35.0 35.5  RDW 14.2 14.2 14.4  PLT 252 200 230    BNP Recent Labs  Lab  09/08/19 1100  BNP 535.2*     DDimer No results for input(s): DDIMER in the last 168 hours.   Radiology    MR THORACIC SPINE W WO CONTRAST  Result Date: 09/12/2019 CLINICAL DATA:  Bone lesion EXAM: MRI THORACIC WITHOUT AND WITH CONTRAST TECHNIQUE: Multiplanar and multiecho pulse sequences of the thoracic spine were obtained without and with intravenous contrast. CONTRAST:  22m GADAVIST GADOBUTROL 1 MMOL/ML IV SOLN COMPARISON:  CTA chest/abdomen/pelvis 09/12/2019 FINDINGS: MRI THORACIC SPINE FINDINGS The examination is intermittently motion degraded. No scout localizer imaging is provided. Spinal numbering is ascertained by numbering caudally from the C2 level on same-day CTA chest/abdomen/pelvis. Alignment: No significant spondylolisthesis. Vertebrae: There is mild loss of T10 vertebral body height. Vertebral body height is otherwise maintained. There is prominent STIR hyperintensity and enhancement within the central and right aspect of the right T10 vertebral body also extending partly into the right T10 pedicle and transverse process. The mass extends into the right paravertebral soft tissues, with this component measuring 2.1 x 1.7 x 2.6 cm (AP x TV x CC) (series 15, image 54) (series 7, image 2). As demonstrated on CT performed earlier the same day, there is involvement of the head of the right tenth rib. The T10 lesion is slightly expansile and there is a convex posterior vertebral margin. There are multiple additional subcentimeter foci of enhancement within the thoracic spine which are indeterminate. These are present within the T7, T8 and T12 vertebrae. Cord: No spinal cord signal abnormality or abnormal cord enhancement. No abnormal enhancement is identified within the  spinal canal. Paraspinal and other soft tissues: Right paraspinal extension of a T10 vertebral body mass as described above. The paraspinal soft tissues are otherwise unremarkable. Disc levels: No more than mild disc degeneration  at any level. At the T10 level, bony expansion and facet hypertrophy contribute to severe spinal canal stenosis with near complete effacement of the thecal sac. There may be mild cord impingement. Bony expansion and facet arthrosis also contributes to moderate/severe right neural foraminal narrowing at T10-T11. At the remaining thoracic levels, there is no focal disc herniation. There is multilevel facet arthrosis and ligamentum flavum hypertrophy which is greatest in the lower thoracic spine. No more than mild spinal canal narrowing at these remaining levels. No significant foraminal narrowing at the remaining levels. IMPRESSION: Again demonstrated is an aggressive appearing mass centered within the T10 vertebral body also extending into the right T10 pedicle and articular pillar. There is right paraspinal extension of this mass with this component measuring 2.6 cm and involving the head of the right tenth rib. There is mild loss of T10 vertebral body height. Primary differential considerations include plasmacytoma, metastatic disease, osteoblastoma or other aggressive primary bone lesion. Consider direct tissue sampling. Posterior bony expansion and facet hypertrophy contribute to severe spinal canal stenosis at the T10 level with near complete effacement of the thecal sac. Moderate/severe right T10-T11 neural foraminal narrowing. There are subcentimeter foci of enhancement within the T7, T8 and T12 vertebrae which are indeterminate. Electronically Signed   By: Kellie Simmering DO   On: 09/12/2019 20:42    Cardiac Studies   TEE 09/11/2019  1. Left ventricular ejection fraction, by estimation, is 55 to 60%. The  left ventricle has normal function. The left ventricle has no regional  wall motion abnormalities. There is mild concentric left ventricular  hypertrophy.  2. Right ventricular systolic function is normal. The right ventricular  size is mildly enlarged. There is mildly elevated pulmonary artery    systolic pressure. The estimated right ventricular systolic pressure is  64.3 mmHg.  3. It appears that the left atrial appendage has been surgically resected  or oversown. Left atrial size was severely dilated. No left atrial/left  atrial appendage thrombus was detected. The LAA emptying velocity was 35  cm/s.  4. Right atrial size was severely dilated.  5. The mitral valve is normal in structure. Mild mitral valve  regurgitation.  6. There is a large, moderately mobile vegetation attached to the septal  leaflet of the tricuspid valve, measuring 27 mm in length and 11 mm in  width. It appears heterogeneous and friable.. Tricuspid valve  regurgitation is moderate.  7. There is a large vegetation attached to the medial aspect of posterior  disc or the posteromedial mechanical valve annulus. It is highly mobile  and prolapses readily across the valve orifice. It measures 15 mm in  length and 6 mm in width. Although  there is no evidence of a periannular abscess cavity, the periannular  tissue echodensity is heterogeneous, raining concern for  inflammation/early abscess formation. The proximity of the aortic and the  tricuspid vegetation to the same area of the  interventricular septum also raises concern for intramyocardial abscess or  fistula formation. The aortic valve has been repaired/replaced. Aortic  valve regurgitation is trivial. Mild aortic valve stenosis. There is a  unknown [employer] tilting disk valve  present in the aortic position. Procedure Date: 2000. Aortic valve mean  gradient measures 20.0 mmHg.  8. There is mild (Grade II) atheroma plaque involving  the transverse  aorta.   Patient Profile     54 y.o. 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  Assessment & Plan    1.  Endocarditis: Will require redo aortic valve replacement, probably with cadaveric homograft, tentatively scheduled for April 29.  On antibiotics IV.  Afebrile, WBC normal.  ESR 124 (09/10/2019).  Prior to surgery needs resolution of dental problems.  The furuncle in his gluteal area has drained.  Warfarin on hold.  On intravenous heparin. 2. CAD: We will have redo bypass to the RCA.  For cardiac catheterization on Monday 4/26. 3. PAFib: Currently in sinus rhythm, on IV heparin. 4. T10 lytic lesion: He has had 4 months of pain in his mid right back, preceding diagnosis of endocarditis.  Consider vertebral osteomyelitis versus plasmacytoma versus metastatic lesion.  Scheduled for biopsy. 5.  DM     For questions or updates, please contact [facility] Please consult [email or web address] for contact info under        Signed, Sanda Klein, MD  09/14/2019, 2:23 PM

## 2019-09-14 NOTE — Progress Notes (Signed)
[place] for warfarin>>heparin Indication: atrial fibrillation + mechanical AVR  Allergies  Allergen Reactions  . Diltiazem Hives  . Insulin Glargine Swelling    edema    Patient Measurements: Height: 6\' 2"  (188 cm) Weight: 129 kg (284 lb 6.3 oz) IBW/kg (Calculated) : 82.2  Vital Signs: Temp: 98.8 F (37.1 C) (04/24 0506) Temp Source: Oral (04/24 0506) BP: 128/78 (04/24 0506) Pulse Rate: 88 (04/24 0506)  Labs: Recent Labs    09/12/19 0535 09/12/19 0535 09/13/19 0424 09/13/19 1759 09/14/19 0339  HGB 10.6*  --  8.9*  --   --   HCT 29.7*  --  25.4*  --   --   PLT 252  --  200  --   --   LABPROT 31.0*   < > 27.9* 26.6* 25.0*  INR 3.0*   < > 2.6* 2.5* 2.3*  CREATININE 1.17  --  1.01  --   --    < > = values in this interval not displayed.    Estimated Creatinine Clearance: 119.3 mL/min (by C-G formula based on SCr of 1.01 mg/dL).   Medical History: Past Medical History:  Diagnosis Date  . Ascending aortic dissection ([place])    a. 2000 - with aortic valve involvement. S/p repair of aortic dissection with placement of mechanical AVR.  [name] CAD (coronary artery disease)    a. 2000 VG->PDA @ time of Ao dissection repair;  b. Cardiac CT 06/2010: no CAD, only mild plaque in prox RCA;  c. 2015 Cath: LM nl, LAD nl, LCX nl, RCA 100, VG->PDA 90 (4.5x12 Rebel BMS). d. 10/23/17 instent restenosis SVG to RCA, DES placed.  . Cholelithiasis 08/22/2013  . Chronic diastolic CHF (congestive heart failure) ([place])    a. 03/2016 Echo: EF 55-60%, no rwma, Gr2 DD.  . Diabetes mellitus   . Dyspnea   . Gross hematuria   . H/O mechanical aortic valve replacement    a. 03/2016 Echo: AoV mean gradient 72mmHg, Valve Area (VTI) 1.36 cm^2, (Vmax) 1.22 cm^2, mod dil LA.  [name] Hyperlipidemia   . Hypertension   . NEPHROLITHIASIS, HX OF   . Obesity   . PAF (paroxysmal atrial fibrillation) ([place])    a. H/o such, with recurrence of coarse afib/flutter during ER consult  03/2012.  . Stroke ([place])   . TRANSIENT ISCHEMIC ATTACK, HX OF    a. In 2004.  [name] Unspecified vitamin D deficiency    Assessment: 17 YOM presenting with SOB and N/V, on warfarin PTA for Afib and mechanical AVR. Vegetation seen on TTE, will need to hold warfarin and begin IV heparin with impending need for procedures.  INR this morning continues to be therapeutic at 2.6, CBC has been stable, however hgb down this am 10.6>8.9.   PTA dosing: 5mg  MWF, 7.5mg  all other days  4/24 AM update:  INR now 2.3, will start heparin   Goal of Therapy:  Heparin level 0.3-0.7 units/mL INR 2.5-3.5 Monitor platelets by anticoagulation protocol: Yes   Plan:  Start heparin drip at 1500 units/hr 1500 heparin level  Narda Bonds, PharmD, [place] Pharmacist Phone: [phone]

## 2019-09-14 NOTE — Progress Notes (Signed)
Overall stable.  Back pain well controlled.  No radicular symptoms.  No lower extremity symptoms.  Awake and alert.  Oriented and appropriate.  Motor and sensory function of the extremities normal.  Patient with recently discovered T10 marrow based lesion with some expansion of the T10 vertebra and perhaps some very minimal pathologic fracture.  Imaging most consistent with plasmacytoma.  Recommend CT-guided biopsy per radiology.  No indication for neurosurgical intervention at present.

## 2019-09-14 NOTE — Consult Note (Addendum)
Chief Complaint: Patient was seen in consultation today for image guided T10 lesion/mass biopsy/possible osteocool with kyphopalsty Chief Complaint  Patient presents with  . Shortness of Breath  . Emesis    Referring Physician(s): Pool,H  Supervising Physician: Dr.  Debbrah Alar  Patient Status: [facility] - In-pt  History of Present Illness: [name] is a 54 y.o. male with past medical history significant for prior aortic dissection repair with AVR, hypothyroidism, coronary artery disease with prior CABG, CHF, diabetes, hyperlipidemia, hypertension, nephrolithiasis, obesity, paroxysmal atrial fibrillation, prior TIA who presented to [facility] on 4/18 with chills, nausea, vomiting, diarrhea, back pain, dyspnea with exertion and orthopnea. 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 Request now received from neurosurgical service for image guided biopsy of the T10 lesion/possible osteocool with kyphoplasty.   Past Medical History:  Diagnosis Date  . Ascending aortic dissection ([place])    a. 2000 - with aortic valve involvement. S/p repair of aortic dissection with placement of mechanical AVR.  [name] CAD (coronary artery disease)    a. 2000 VG->PDA @ time of Ao dissection repair;  b. Cardiac CT 06/2010: no CAD, only mild plaque in prox RCA;  c. 2015 Cath: LM nl, LAD nl, LCX nl, RCA 100, VG->PDA 90 (4.5x12 Rebel BMS). d. 10/23/17 instent restenosis SVG to RCA, DES placed.  . Cholelithiasis 08/22/2013  . Chronic diastolic CHF (congestive heart failure) ([place])    a. 03/2016 Echo: EF 55-60%, no rwma, Gr2 DD.  . Diabetes mellitus   . Dyspnea   . Gross hematuria   . H/O  mechanical aortic valve replacement    a. 03/2016 Echo: AoV mean gradient 53mmHg, Valve Area (VTI) 1.36 cm^2, (Vmax) 1.22 cm^2, mod dil LA.  [name] Hyperlipidemia   . Hypertension   . NEPHROLITHIASIS, HX OF   . Obesity   . PAF (paroxysmal atrial fibrillation) ([place])    a. H/o such, with recurrence of coarse afib/flutter during ER consult 03/2012.  . Stroke ([place])   . TRANSIENT ISCHEMIC ATTACK, HX OF    a. In 2004.  [name] Unspecified vitamin D deficiency     Past Surgical History:  Procedure Laterality Date  . AORTIC VALVE REPLACEMENT  2000  . APPENDECTOMY  1998  . CORONARY ARTERY BYPASS GRAFT  2000  . CORONARY STENT INTERVENTION N/A 10/23/2017   Procedure: CORONARY STENT INTERVENTION;  Surgeon: [name], MD;  Location: [place] [facility];  Service: Cardiovascular;  Laterality: N/A;  . LEFT AND RIGHT HEART CATHETERIZATION WITH CORONARY ANGIOGRAM N/A 07/19/2013   Procedure: LEFT AND RIGHT HEART CATHETERIZATION WITH CORONARY ANGIOGRAM;  Surgeon: [name], MD;  Location[facility] CATH LAB;  Service: Cardiovascular;  Laterality: N/A;  . LUMBAR LAMINECTOMY/DECOMPRESSION MICRODISCECTOMY Right 06/06/2018   Procedure: Right L3-4 disectomy;  Surgeon: Melina Schools, MD;  Location: [place];  Service: Orthopedics;  Laterality: Right;  2.5 hrs  . RIGHT HEART CATH AND CORONARY/GRAFT ANGIOGRAPHY N/A 10/23/2017   Procedure: RIGHT HEART CATH AND CORONARY/GRAFT ANGIOGRAPHY;  Surgeon: [name], MD;  Location: [place] [facility];  Service: Cardiovascular;  Laterality: N/A;  . TEE WITHOUT CARDIOVERSION N/A 07/22/2013   Procedure: TRANSESOPHAGEAL ECHOCARDIOGRAM (TEE);  Surgeon: [name], MD;  Location: [place];  Service: Cardiovascular;  Laterality: N/A;  .  TEE WITHOUT CARDIOVERSION N/A 09/11/2019   Procedure: TRANSESOPHAGEAL ECHOCARDIOGRAM (TEE);  Surgeon: Sanda Klein, MD;  Location[facility] ENDOSCOPY;  Service: Cardiovascular;  Laterality: N/A;    Allergies: Diltiazem and Insulin  glargine  Medications: Prior to Admission medications   Medication Sig Start Date End Date Taking? Authorizing Provider  aspirin EC 81 MG tablet Take 162 mg by mouth daily as needed (headache).   Yes [provider]  atorvastatin (LIPITOR) 40 MG tablet TAKE 1 TABLET EVERY DAY  AT  6  PM Patient taking differently: Take 40 mg by mouth daily with breakfast.  08/26/19  Yes Biagio Borg, MD  cholecalciferol (VITAMIN D) 1000 UNITS tablet Take 1,000 Units by mouth daily with breakfast.    Yes [provider]  furosemide (LASIX) 40 MG tablet Take 1 tablet (40 mg total) by mouth 2 (two) times daily. Patient taking differently: Take 40 mg by mouth 2 (two) times daily with a meal.  09/05/19  Yes Crenshaw, Denice Bors, MD  gabapentin (NEURONTIN) 300 MG capsule Take 1 capsule (300 mg total) by mouth 3 (three) times daily. Patient taking differently: Take 300 mg by mouth daily with breakfast.  01/09/19  Yes Jaynee Eagles, PA-C  glimepiride (AMARYL) 1 MG tablet TAKE 3 TABLETS ONE TIME DAILY WITH BREAKFAST Patient taking differently: Take 1 mg by mouth daily with breakfast.  03/14/17  Yes Biagio Borg, MD  levothyroxine (SYNTHROID) 50 MCG tablet TAKE 1 TABLET EVERY DAY Patient taking differently: Take 50 mcg by mouth daily with breakfast.  06/07/19  Yes Biagio Borg, MD  losartan (COZAAR) 100 MG tablet Take 1 tablet (100 mg total) by mouth daily. Patient taking differently: Take 100 mg by mouth daily with breakfast.  08/21/19  Yes Biagio Borg, MD  metFORMIN (GLUCOPHAGE) 1000 MG tablet Take 1 tablet (1,000 mg total) by mouth 2 (two) times daily with a meal. 09/28/18  Yes Biagio Borg, MD  metoprolol (TOPROL-XL) 200 MG 24 hr tablet Take 1 tablet (200 mg total) by mouth daily. Take with or immediately following a meal. Patient taking differently: Take 200 mg by mouth daily with breakfast. Take with or immediately following a meal. 09/05/19  Yes Kilroy, Luke K, PA-C  nitroGLYCERIN (NITROSTAT) 0.4 MG SL  tablet Place 1 tablet (0.4 mg total) under the tongue every 5 (five) minutes x 3 doses as needed for chest pain. 07/25/13  Yes Kilroy, Luke K, PA-C  potassium chloride SA (KLOR-CON) 20 MEQ tablet Take 20 mEq by mouth 2 (two) times daily with a meal.   Yes [provider]  Vitamin D, Ergocalciferol, (DRISDOL) 1.25 MG (50000 UT) CAPS capsule Take 1 capsule (50,000 Units total) by mouth every 7 (seven) days. Patient taking differently: Take 50,000 Units by mouth every Tuesday.  04/02/19  Yes Biagio Borg, MD  warfarin (COUMADIN) 5 MG tablet TAKE 1 TO 1 AND 1/2 TABLETS EVERY DAY AS DIRECTED BY COUMADIN CLINIC Patient taking differently: Take 5-7.5 mg by mouth See admin instructions. Take 1 tablet (5 mg) by mouth on Monday, Wednesday, Friday with supper, take 1 1/2 tablets (7.5 mg) on Sunday, Tuesday, Thursday, Saturday with supper - or as directed by coumadin clinic 08/28/19  Yes Crenshaw, Denice Bors, MD  amLODipine (NORVASC) 10 MG tablet Take 1 tablet (10 mg total) by mouth daily. 09/05/19   Erlene Quan, PA-C  Blood Glucose Monitoring Suppl (TRUE METRIX AIR GLUCOSE METER) DEVI 1 Device by Does not apply route daily. E11.9 08/26/19  Biagio Borg, MD  clopidogrel (PLAVIX) 75 MG tablet Take 1 tablet (75 mg total) by mouth daily. 09/05/19   Erlene Quan, PA-C  glucose blood (TRUE METRIX BLOOD GLUCOSE TEST) test strip Use as directed once daily 08/26/19   Biagio Borg, MD  enoxaparin (LOVENOX) 120 MG/0.8ML injection Inject 0.8 mLs (120 mg total) into the skin every 12 (twelve) hours. 06/19/18 01/09/19  Lelon Perla, MD     Family History  Problem Relation Age of Onset  . Coronary artery disease Mother   . Hypertension Mother   . Diabetes Brother   . Coronary artery disease Other 71       male 1st degree relative, CABG in his 41's (father)  . Cancer Father        ? lung    Social History   Socioeconomic History  . Marital status: Married    Spouse name: Not on file  . Number of children:  4  . Years of education: Not on file  . Highest education level: Not on file  Occupational History  . Occupation: DISABLED    Employer: UNEMPLOYED  Tobacco Use  . Smoking status: Never Smoker  . Smokeless tobacco: Never Used  Substance and Sexual Activity  . Alcohol use: No    Alcohol/week: 0.0 standard drinks  . Drug use: No  . Sexual activity: Not on file  Other Topics Concern  . Not on file  Social History Narrative  . Not on file   Social Determinants of Health   Financial Resource Strain:   . Difficulty of Paying Living Expenses:   Food Insecurity:   . Worried About Charity fundraiser in the Last Year:   . Arboriculturist in the Last Year:   Transportation Needs:   . Film/video editor (Medical):   [name] Lack of Transportation (Non-Medical):   Physical Activity:   . Days of Exercise per Week:   . Minutes of Exercise per Session:   Stress:   . Feeling of Stress :   Social Connections:   . Frequency of Communication with Friends and Family:   . Frequency of Social Gatherings with Friends and Family:   . Attends Religious Services:   . Active Member of Clubs or Organizations:   . Attends Archivist Meetings:   [name] Marital Status:       Review of Systems see above; currently denies fever, headache, chest pain, cough, abdominal pain, nausea vomiting or bleeding. He does have some back pain but this has improved since admission/medications.  Vital Signs: BP 128/78 (BP Location: Left Arm)   Pulse 88   Temp 98.8 F (37.1 C) (Oral)   Resp 18   Ht 6\' 2"  (1.88 m)   Wt 284 lb 6.3 oz (129 kg)   SpO2 98%   BMI 36.51 kg/m   Physical Exam patient awake, alert. Chest clear to auscultation bilaterally. Heart with regular rate and rhythm, positive murmur. Abdomen obese, soft, positive bowel sounds, nontender. No sig lower extremity edema. Left buttocks abscess which appears to have spontaneously drained. Imaging: DG Orthopantogram  Result Date:  09/11/2019 CLINICAL DATA:  Pre-op testing. Molar pain. EXAM: ORTHOPANTOGRAM/PANORAMIC COMPARISON:  None. FINDINGS: Several teeth are absent. There is typical midline artifact of the Panorex technique. Significant dental caries involving the right maxillary wisdom tooth and the most posterior remaining left mandibular molar. That tooth also demonstrates significant periodontal disease. No evidence of acute fracture or dislocation. IMPRESSION: Dental caries  and periodontal disease as described. Electronically Signed   By: Richardean Sale M.D.   On: 09/11/2019 18:04   DG Chest 2 View  Result Date: 09/08/2019 CLINICAL DATA:  Dyspnea, nausea, vomiting EXAM: CHEST - 2 VIEW COMPARISON:  10/21/2017 chest radiograph. FINDINGS: Intact sternotomy wires. Aortic valve prosthesis is in place. Stable cardiomediastinal silhouette with mild cardiomegaly. No pneumothorax. No pleural effusion. Lungs appear clear, with no acute consolidative airspace disease and no pulmonary edema. IMPRESSION: Stable mild cardiomegaly without pulmonary edema. No active pulmonary disease. Electronically Signed   By: Ilona Sorrel M.D.   On: 09/08/2019 11:11   MR THORACIC SPINE W WO CONTRAST  Result Date: 09/12/2019 CLINICAL DATA:  Bone lesion EXAM: MRI THORACIC WITHOUT AND WITH CONTRAST TECHNIQUE: Multiplanar and multiecho pulse sequences of the thoracic spine were obtained without and with intravenous contrast. CONTRAST:  39mL GADAVIST GADOBUTROL 1 MMOL/ML IV SOLN COMPARISON:  CTA chest/abdomen/pelvis 09/12/2019 FINDINGS: MRI THORACIC SPINE FINDINGS The examination is intermittently motion degraded. No scout localizer imaging is provided. Spinal numbering is ascertained by numbering caudally from the C2 level on same-day CTA chest/abdomen/pelvis. Alignment: No significant spondylolisthesis. Vertebrae: There is mild loss of T10 vertebral body height. Vertebral body height is otherwise maintained. There is prominent STIR hyperintensity and  enhancement within the central and right aspect of the right T10 vertebral body also extending partly into the right T10 pedicle and transverse process. The mass extends into the right paravertebral soft tissues, with this component measuring 2.1 x 1.7 x 2.6 cm (AP x TV x CC) (series 15, image 54) (series 7, image 2). As demonstrated on CT performed earlier the same day, there is involvement of the head of the right tenth rib. The T10 lesion is slightly expansile and there is a convex posterior vertebral margin. There are multiple additional subcentimeter foci of enhancement within the thoracic spine which are indeterminate. These are present within the T7, T8 and T12 vertebrae. Cord: No spinal cord signal abnormality or abnormal cord enhancement. No abnormal enhancement is identified within the spinal canal. Paraspinal and other soft tissues: Right paraspinal extension of a T10 vertebral body mass as described above. The paraspinal soft tissues are otherwise unremarkable. Disc levels: No more than mild disc degeneration at any level. At the T10 level, bony expansion and facet hypertrophy contribute to severe spinal canal stenosis with near complete effacement of the thecal sac. There may be mild cord impingement. Bony expansion and facet arthrosis also contributes to moderate/severe right neural foraminal narrowing at T10-T11. At the remaining thoracic levels, there is no focal disc herniation. There is multilevel facet arthrosis and ligamentum flavum hypertrophy which is greatest in the lower thoracic spine. No more than mild spinal canal narrowing at these remaining levels. No significant foraminal narrowing at the remaining levels. IMPRESSION: Again demonstrated is an aggressive appearing mass centered within the T10 vertebral body also extending into the right T10 pedicle and articular pillar. There is right paraspinal extension of this mass with this component measuring 2.6 cm and involving the head of the  right tenth rib. There is mild loss of T10 vertebral body height. Primary differential considerations include plasmacytoma, metastatic disease, osteoblastoma or other aggressive primary bone lesion. Consider direct tissue sampling. Posterior bony expansion and facet hypertrophy contribute to severe spinal canal stenosis at the T10 level with near complete effacement of the thecal sac. Moderate/severe right T10-T11 neural foraminal narrowing. There are subcentimeter foci of enhancement within the T7, T8 and T12 vertebrae which are indeterminate. Electronically  Signed   By: Kellie Simmering DO   On: 09/12/2019 20:42   CT ANGIO CHEST AORTA W/CM & OR WO/CM  Result Date: 09/12/2019 CLINICAL DATA:  54 year old male under preoperative evaluation prior to potential redo Bentall procedure. EXAM: CT ANGIOGRAPHY CHEST, ABDOMEN AND PELVIS TECHNIQUE: Non-contrast CT of the chest was initially obtained. Multidetector CT imaging through the chest, abdomen and pelvis was performed using the standard protocol during bolus administration of intravenous contrast. Multiplanar reconstructed images and MIPs were obtained and reviewed to evaluate the vascular anatomy. CONTRAST:  154mL OMNIPAQUE IOHEXOL 350 MG/ML SOLN COMPARISON:  Chest CTA 10/09/2014. CTA of the chest, abdomen and pelvis 07/24/2013. FINDINGS: CTA CHEST FINDINGS Cardiovascular: Heart size is enlarged. There is no significant pericardial fluid, thickening or pericardial calcification. 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 There is a small defect on the undersurface of the mechanical aortic valve best appreciated on coronal image 82 of series 8 measuring approximately 8 mm, which may represent valve associated thrombus and/or vegetation. The aortic arch is mildly aneurysmal measuring up to 4.3 cm in diameter. There is a residual dissection flap in the proximal and mid aortic arch which does not  propagate into the great vessels. Descending thoracic aorta is normal in caliber measuring 2.8 cm in diameter. Bovine type thoracic aortic arch incidentally noted. There is a right coronary artery bypass graft. Large filling defect in the superior aspect of the right atrium immediately posterior to the septal leaflet of the tricuspid valve, and intimately associated with the adjacent aortic root, estimated to measure approximately 2.2 x 2.2 x 1.7 cm (axial image 83 of series 6 and coronal image 73 of series 8), presumably a vegetation. Mediastinum/Nodes: No pathologically enlarged mediastinal or hilar lymph nodes. Esophagus is unremarkable in appearance. No axillary lymphadenopathy. Lungs/Pleura: No suspicious pulmonary nodules or masses are noted. No acute consolidative airspace disease. No pleural effusions. Musculoskeletal: In the T10 vertebral body there is a large lucent lesion with extension of soft tissue into the right paravertebral region and involvement of the head of the right tenth rib overall estimated to measure approximately 4.9 x 3.4 x 2.0 cm (axial image 105 of series 6 and sagittal image 117 of series 9). This causes significant narrowing of the central spinal canal (approximately 5 mm AP) at this level. Median sternotomy wires. Review of the MIP images confirms the above findings. CTA ABDOMEN AND PELVIS FINDINGS VASCULAR Aorta: Mild aortic atherosclerosis. Normal caliber aorta without aneurysm, dissection, vasculitis or significant stenosis. Celiac: Patent without evidence of aneurysm, dissection, vasculitis or significant stenosis. SMA: Patent without evidence of aneurysm, dissection, vasculitis or significant stenosis. Renals: Both renal arteries are patent without evidence of aneurysm, dissection, vasculitis, fibromuscular dysplasia or significant stenosis. IMA: Patent without evidence of aneurysm, dissection, vasculitis or significant stenosis. Inflow: Patent without evidence of aneurysm,  dissection, vasculitis or significant stenosis. Veins: No obvious venous abnormality within the limitations of this arterial phase study. Review of the MIP images confirms the above findings. NON-VASCULAR Hepatobiliary: No suspicious appearing cystic or solid hepatic lesions are confidently identified on today's arterial phase examination. No intra or extrahepatic biliary ductal dilatation. Several partially calcified gallstones are noted measuring up to 1.6 cm in diameter. Gallbladder is otherwise unremarkable in appearance. Pancreas: No pancreatic mass. No pancreatic ductal dilatation. No pancreatic or peripancreatic fluid collections or inflammatory changes. Spleen: Unremarkable. Adrenals/Urinary Tract: Bilateral kidneys and adrenal glands are normal in appearance. No hydroureteronephrosis. Urinary bladder is  normal in appearance. Stomach/Bowel: Normal appearance of the stomach. No pathologic dilatation of small bowel or colon. The appendix is not confidently identified and may be surgically absent. Regardless, there are no inflammatory changes noted adjacent to the cecum to suggest the presence of an acute appendicitis at this time. Lymphatic: No lymphadenopathy noted in the abdomen or pelvis. Reproductive: Prostate gland and seminal vesicles are unremarkable in appearance. Other: No significant volume of ascites.  No pneumoperitoneum. Musculoskeletal: There are no aggressive appearing lytic or blastic lesions noted in the visualized portions of the skeleton. Review of the MIP images confirms the above findings. IMPRESSION: 1. 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 2. Small lesions associated with the undersurface of the mechanical aortic valve and the superior aspect of the septal leaflet of the tricuspid valve, which may reflect valve associated  thrombus and/or vegetations, as detailed above. 3. Aggressive appearing lesion in T10 vertebral body with extends in into the right paravertebral soft tissues and involvement of the head of the right tenth rib, with marked narrowing of the central spinal canal at this level, as detailed above. Primary differential considerations include a plasmacytoma, or less likely and osteoblastoma or solitary metastatic lesion (no primary lesion is confidently identified). If there is clinical concern for spinal cord compression, the full extent of this lesion could be better evaluated with thoracic spine MRI with and without IV gadolinium. 4. Bovine type thoracic aortic arch (normal anatomical variant) incidentally noted. 5. Cholelithiasis without evidence of acute cholecystitis at this time. 6. Additional incidental findings, as above. These results will be called to the ordering clinician or representative by the Radiologist Assistant, and communication documented in the PACS or [employer]. Electronically Signed   By: Vinnie Langton M.D.   On: 09/12/2019 13:02   ECHOCARDIOGRAM COMPLETE  Result Date: 09/09/2019    ECHOCARDIOGRAM REPORT   Patient Name:   [name] Date of Exam: 09/09/2019 Medical Rec #:  [id number]        Height:       74.0 in Accession #:    [id number]       Weight:       282.2 lb Date of Birth:  12/25/1965        BSA:          2.516 m Patient Age:    25 years         BP:           120/74 mmHg Patient Gender: M                HR:           77 bpm. Exam Location:  Inpatient Procedure: 2D Echo, Cardiac Doppler and Color Doppler Indications:    Dyspnea 786.09  History:        Patient has prior history of Echocardiogram examinations, most                 recent 10/24/2017. CAD, Prior CABG, Signs/Symptoms:Shortness of                 Breath and Chest Pain; Risk Factors:Hypertension, Diabetes,                 Dyslipidemia and Non-Smoker. Bentall.                 Aortic Valve: mechanical valve is  present  in the aortic                 position. Procedure Date: 2000.  Sonographer:    Vickie Epley RDCS Referring Phys: [id number] [place]  1. Tricuspid valve vegetation. Discussed with referring provider.  2. Left ventricular ejection fraction, by estimation, is 55 to 60%. The left ventricle has normal function. The left ventricle has no regional wall motion abnormalities. Left ventricular diastolic parameters were normal.  3. Right ventricular systolic function is normal. The right ventricular size is mildly enlarged. There is normal pulmonary artery systolic pressure.  4. Left atrial size was moderately dilated.  5. Right atrial size was severely dilated.  6. The mitral valve is normal in structure. Mild mitral valve regurgitation. No evidence of mitral stenosis.  7. There is a 1.8 x 1.1 cm tricuspid valve mass on the atrial surface consistent with vegetation. . Tricuspid valve regurgitation is moderate.  8. Transaortic velocity is higher than expected for mechanical aortic valve. The aortic valve is normal in structure. Aortic valve regurgitation is not visualized. No aortic stenosis is present. There is a mechanical valve present in the aortic position. Procedure Date: 2000. Aortic valve mean gradient measures 33.5 mmHg. Aortic valve Vmax measures 3.68 m/s.  9. Aortic root/ascending aorta has been repaired/replaced and Bentall. 10. The inferior vena cava is normal in size with greater than 50% respiratory variability, suggesting right atrial pressure of 3 mmHg. FINDINGS  Left Ventricle: Left ventricular ejection fraction, by estimation, is 55 to 60%. The left ventricle has normal function. The left ventricle has no regional wall motion abnormalities. The left ventricular internal cavity size was normal in size. There is  no left ventricular hypertrophy. Left ventricular diastolic parameters were normal. Right Ventricle: The right ventricular size is mildly enlarged. No increase in right  ventricular wall thickness. Right ventricular systolic function is normal. There is normal pulmonary artery systolic pressure. The tricuspid regurgitant velocity is 2.51  m/s, and with an assumed right atrial pressure of 3 mmHg, the estimated right ventricular systolic pressure is [id number] mmHg. Left Atrium: Left atrial size was moderately dilated. Right Atrium: Right atrial size was severely dilated. Pericardium: There is no evidence of pericardial effusion. Mitral Valve: The mitral valve is normal in structure. Normal mobility of the mitral valve leaflets. Mild mitral annular calcification. Mild mitral valve regurgitation. No evidence of mitral valve stenosis. Tricuspid Valve: There is a 1.8 x 1.1 cm tricuspid valve mass on the atrial surface consistent with vegetation. The tricuspid valve is normal in structure. Tricuspid valve regurgitation is moderate . No evidence of tricuspid stenosis. Aortic Valve: Transaortic velocity is higher than expected for mechanical aortic valve. The aortic valve is normal in structure. Aortic valve regurgitation is not visualized. No aortic stenosis is present. Aortic valve mean gradient measures 33.5 mmHg. Aortic valve peak gradient measures 54.3 mmHg. Aortic valve area, by VTI measures 0.93 cm. There is a mechanical valve present in the aortic position. Procedure Date: 2000. Pulmonic Valve: The pulmonic valve was normal in structure. Pulmonic valve regurgitation is mild. No evidence of pulmonic stenosis. Aorta: The aortic root/ascending aorta has been repaired/replaced and Bentall. Venous: The inferior vena cava is normal in size with greater than 50% respiratory variability, suggesting right atrial pressure of 3 mmHg. IAS/Shunts: No atrial level shunt detected by color flow Doppler.  LEFT VENTRICLE PLAX 2D LVIDd:         5.60 cm      Diastology LVIDs:  4.60 cm      LV e' lateral:   13.10 cm/s LV PW:         1.10 cm      LV E/e' lateral: 5.7 LV IVS:        1.10 cm      LV e'  medial:    8.49 cm/s LVOT diam:     2.20 cm      LV E/e' medial:  8.8 LV SV:         73 LV SV Index:   29 LVOT Area:     3.80 cm  LV Volumes (MOD) LV vol d, MOD A2C: 163.0 ml LV vol d, MOD A4C: 157.0 ml LV vol s, MOD A2C: 88.8 ml LV vol s, MOD A4C: 72.8 ml LV SV MOD A2C:     74.2 ml LV SV MOD A4C:     157.0 ml LV SV MOD BP:      77.9 ml RIGHT VENTRICLE RV S prime:     9.25 cm/s TAPSE (M-mode): 1.8 cm LEFT ATRIUM             Index       RIGHT ATRIUM           Index LA diam:        5.80 cm 2.31 cm/m  RA Area:     37.80 cm LA Vol (A2C):   65.8 ml 26.15 ml/m RA Volume:   141.00 ml 56.04 ml/m LA Vol (A4C):   76.5 ml 30.41 ml/m LA Biplane Vol: 77.8 ml 30.92 ml/m  AORTIC VALVE AV Area (Vmax):    0.95 cm AV Area (Vmean):   0.88 cm AV Area (VTI):     0.93 cm AV Vmax:           368.50 cm/s AV Vmean:          273.000 cm/s AV VTI:            0.786 m AV Peak Grad:      54.3 mmHg AV Mean Grad:      33.5 mmHg LVOT Vmax:         92.00 cm/s LVOT Vmean:        63.300 cm/s LVOT VTI:          0.193 m LVOT/AV VTI ratio: 0.25  AORTA Ao Root diam: 2.90 cm MITRAL VALVE               TRICUSPID VALVE MV Area (PHT): 5.02 cm    TR Peak grad:   25.2 mmHg MV Decel Time: 151 msec    TR Vmax:        251.00 cm/s MR Peak grad: 107.3 mmHg MR Vmax:      518.00 cm/s  SHUNTS MV E velocity: 74.60 cm/s  Systemic VTI:  0.19 m MV A velocity: 37.70 cm/s  Systemic Diam: 2.20 cm MV E/A ratio:  1.98 Candee Furbish MD Electronically signed by Candee Furbish MD Signature Date/Time: 09/09/2019/12:06:06 PM    Final    ECHO TEE  Result Date: 09/11/2019    TRANSESOPHOGEAL ECHO REPORT   Patient Name:   [name] Date of Exam: 09/11/2019 Medical Rec #:  [id number]        Height:       74.0 in Accession #:    [id number]       Weight:       280.0 lb Date of Birth:  03/19/1966        BSA:  2.508 m Patient Age:    30 years         BP:           108/65 mmHg Patient Gender: M                HR:           106 bpm. Exam Location:  Inpatient Procedure:  Transesophageal Echo, Cardiac Doppler and Color Doppler Indications:     Bacteremia.  History:         Patient has prior history of Echocardiogram examinations, most                  recent 09/09/2019. CAD, Aortic Valve Disease, Endocarditis,                  Tricuspid vegetation and Prosthetic Valve Complications,                  Signs/Symptoms:Altered Mental Status; Risk Factors:Diabetes,                  Hypertension and Dyslipidemia. Aortic valve replacement.                  Bentall procedure. Aortic dissection.                  Aortic Valve: unknown Medtronic tilting disk valve is present                  in the aortic position. Procedure Date: 2000.  Sonographer:     Roseanna Rainbow RDCS Referring Phys:  [id number] RHONDA G BARRETT Diagnosing Phys: Sanda Klein MD PROCEDURE: After discussion of the risks and benefits of a TEE, an informed consent was obtained from the patient. The transesophogeal probe was passed without difficulty through the esophogus of the patient. Imaged were obtained with the patient in a left lateral decubitus position. Sedation performed by different physician. The patient was monitored while under deep sedation. Anesthestetic sedation was provided intravenously by Anesthesiology: 230mg  of Propofol. The patient's vital signs; including heart rate, blood pressure, and oxygen saturation; remained stable throughout the procedure. The patient developed no complications during the procedure. IMPRESSIONS  1. Left ventricular ejection fraction, by estimation, is 55 to 60%. The left ventricle has normal function. The left ventricle has no regional wall motion abnormalities. There is mild concentric left ventricular hypertrophy.  2. Right ventricular systolic function is normal. The right ventricular size is mildly enlarged. There is mildly elevated pulmonary artery systolic pressure. The estimated right ventricular systolic pressure is [id number] mmHg.  3. It appears that the left atrial appendage has been  surgically resected or oversown. Left atrial size was severely dilated. No left atrial/left atrial appendage thrombus was detected. The LAA emptying velocity was 35 cm/s.  4. Right atrial size was severely dilated.  5. The mitral valve is normal in structure. Mild mitral valve regurgitation.  6. There is a large, moderately mobile vegetation attached to the septal leaflet of the tricuspid valve, measuring 27 mm in length and 11 mm in width. It appears heterogeneous and friable.. Tricuspid valve regurgitation is moderate.  7. There is a large vegetation attached to the medial aspect of posterior disc or the posteromedial mechanical valve annulus. It is highly mobile and prolapses readily across the valve orifice. It measures 15 mm in length and 6 mm in width. Although there is no evidence of a periannular abscess cavity, the periannular tissue echodensity is heterogeneous, raining concern for inflammation/early  abscess formation. The proximity of the aortic and the tricuspid vegetation to the same area of the interventricular septum also raises concern for intramyocardial abscess or fistula formation. The aortic valve has been repaired/replaced. Aortic valve regurgitation is trivial. Mild aortic valve stenosis. There is a unknown Medtronic tilting disk valve present in the aortic position. Procedure Date: 2000. Aortic valve mean gradient measures 20.0 mmHg.  8. There is mild (Grade II) atheroma plaque involving the transverse aorta. Comparison(s): Compared to previous reports, there is evidence of vegetations on the tricuspid valve and the mechanical aortic valve and suspicion for early perivalvular abscess. The dimensionless aortic vale index is similar to older studies. This suggests that the increased aortic valve gradients are due to increased cardiac output (rather than obstruction from the vegetation or compromised prosthetic disc motion). FINDINGS  Left Ventricle: Left ventricular ejection fraction, by  estimation, is 55 to 60%. The left ventricle has normal function. The left ventricle has no regional wall motion abnormalities. The left ventricular internal cavity size was normal in size. There is  mild concentric left ventricular hypertrophy. Abnormal (paradoxical) septal motion consistent with post-operative status. Right Ventricle: The right ventricular size is mildly enlarged. No increase in right ventricular wall thickness. Right ventricular systolic function is normal. There is mildly elevated pulmonary artery systolic pressure. The tricuspid regurgitant velocity is 2.48 m/s, and with an assumed right atrial pressure of 8 mmHg, the estimated right ventricular systolic pressure is [id number] mmHg. Left Atrium: It appears that the left atrial appendage has been surgically resected or oversown. Left atrial size was severely dilated. Spontaneous echo contrast was present in the left atrium. No left atrial/left atrial appendage thrombus was detected. The LAA emptying velocity was 35 cm/s. Right Atrium: Right atrial size was severely dilated. Prominent Eustachian valve. Pericardium: There is no evidence of pericardial effusion. Mitral Valve: The mitral valve is normal in structure. Mild mitral valve regurgitation, with centrally-directed jet. Tricuspid Valve: There is a large, moderately mobile vegetation attached to the septal leaflet of the tricuspid valve, measuring 27 mm in length and 11 mm in width. It appears heterogeneous and friable. The tricuspid valve is normal in structure. Tricuspid valve regurgitation is moderate. Aortic Valve: There is a large vegetation attached to the medial aspect of posterior disc or the posteromedial mechanical valve annulus. It is highly mobile and prolapses readily across the valve orifice. It measures 15 mm in length and 6 mm in width. Although there is no evidence of a periannular abscess cavity, the periannular tissue echodensity is heterogeneous, raining concern for  inflammation/early abscess formation. The proximity of the aortic and the tricuspid vegetation to the same area of the  interventricular septum also raises concern for intramyocardial abscess or fistula formation. The aortic valve has been repaired/replaced. Aortic valve regurgitation is trivial. Mild aortic stenosis is present. Aortic valve mean gradient measures 20.0 mmHg. Aortic valve peak gradient measures 32.9 mmHg. There is a unknown Medtronic tilting disk valve present in the aortic position. Procedure Date: 2000. Pulmonic Valve: The pulmonic valve was normal in structure. Pulmonic valve regurgitation is trivial. Aorta: The aortic root, ascending aorta, aortic arch and descending aorta are all structurally normal, with no evidence of dilitation or obstruction. There is mild (Grade II) atheroma plaque involving the transverse aorta. IAS/Shunts: No atrial level shunt detected by color flow Doppler.  AORTIC VALVE AV Vmax:           287.00 cm/s AV Vmean:  208.000 cm/s AV VTI:            0.444 m AV Peak Grad:      32.9 mmHg AV Mean Grad:      20.0 mmHg LVOT Vmax:         126.00 cm/s LVOT Vmean:        90.500 cm/s LVOT VTI:          0.223 m LVOT/AV VTI ratio: 0.50 TRICUSPID VALVE TR Peak grad:   24.6 mmHg TR Vmax:        248.00 cm/s  SHUNTS Systemic VTI: 0.22 m Dani Gobble Croitoru MD Electronically signed by Sanda Klein MD Signature Date/Time: 09/11/2019/2:35:26 PM    Final    VAS US DOPPLER PRE CABG  Result Date: 09/12/2019 PREOPERATIVE VASCULAR EVALUATION  Indications:      Pre-CABG. Risk Factors:     Hypertension, hyperlipidemia, Diabetes. Comparison Study: No prior studies. Performing Technologist: Carlos Levering Rvt  Examination Guidelines: A complete evaluation includes B-mode imaging, spectral Doppler, color Doppler, and power Doppler as needed of all accessible portions of each vessel. Bilateral testing is considered an integral part of a complete examination. Limited examinations for reoccurring  indications may be performed as noted.  Right Carotid Findings: +----------+--------+--------+--------+-----------------------+--------+           PSV cm/sEDV cm/sStenosisDescribe               Comments +----------+--------+--------+--------+-----------------------+--------+ CCA Prox  157     16              smooth and heterogenoustortuous +----------+--------+--------+--------+-----------------------+--------+ CCA Distal46      13              smooth and heterogenous         +----------+--------+--------+--------+-----------------------+--------+ ICA Prox  50      16              smooth and heterogenous         +----------+--------+--------+--------+-----------------------+--------+ ICA Distal42      18                                     tortuous +----------+--------+--------+--------+-----------------------+--------+ ECA       74      8                                               +----------+--------+--------+--------+-----------------------+--------+ Portions of this table do not appear on this page. +----------+--------+-------+--------+------------+           PSV cm/sEDV cmsDescribeArm Pressure +----------+--------+-------+--------+------------+ Subclavian81                     134          +----------+--------+-------+--------+------------+ +---------+--------+--+--------+--+---------+ VertebralPSV cm/s33EDV cm/s10Antegrade +---------+--------+--+--------+--+---------+ Left Carotid Findings: +----------+--------+--------+--------+-----------------------+--------+           PSV cm/sEDV cm/sStenosisDescribe               Comments +----------+--------+--------+--------+-----------------------+--------+ CCA Prox  65      16              smooth and heterogenous         +----------+--------+--------+--------+-----------------------+--------+ CCA Distal62      9               smooth and heterogenous          +----------+--------+--------+--------+-----------------------+--------+  ICA Prox  40      17                                     tortuous +----------+--------+--------+--------+-----------------------+--------+ ICA Distal56      29                                     tortuous +----------+--------+--------+--------+-----------------------+--------+ ECA       45      6                                               +----------+--------+--------+--------+-----------------------+--------+ +----------+--------+--------+--------+------------+ SubclavianPSV cm/sEDV cm/sDescribeArm Pressure +----------+--------+--------+--------+------------+           71                      136          +----------+--------+--------+--------+------------+ +---------+--------+--+--------+-+---------+ VertebralPSV cm/s38EDV cm/s9Antegrade +---------+--------+--+--------+-+---------+  ABI Findings: +--------+------------------+-----+---------+--------+ Right   Rt Pressure (mmHg)IndexWaveform Comment  +--------+------------------+-----+---------+--------+ [id number]                    triphasic         +--------+------------------+-----+---------+--------+ PTA     147               1.08 triphasic         +--------+------------------+-----+---------+--------+ DP      176               1.29 triphasic         +--------+------------------+-----+---------+--------+ +--------+------------------+-----+---------+-------+ Left    Lt Pressure (mmHg)IndexWaveform Comment +--------+------------------+-----+---------+-------+ [id number]                    triphasic        +--------+------------------+-----+---------+-------+ PTA     122               0.90 triphasic        +--------+------------------+-----+---------+-------+ DP      155               1.14 triphasic        +--------+------------------+-----+---------+-------+ +-------+---------------+----------------+  ABI/TBIToday's ABI/TBIPrevious ABI/TBI +-------+---------------+----------------+ Right  1.29                            +-------+---------------+----------------+ Left   1.14                            +-------+---------------+----------------+  Right Doppler Findings: +--------+--------+-----+---------+--------+ Site    PressureIndexDoppler  Comments +--------+--------+-----+---------+--------+ [id number]          triphasic         +--------+--------+-----+---------+--------+ Radial               triphasic         +--------+--------+-----+---------+--------+ Ulnar                triphasic         +--------+--------+-----+---------+--------+  Left Doppler Findings: +--------+--------+-----+---------+--------+ Site    PressureIndexDoppler  Comments +--------+--------+-----+---------+--------+ [id number]          triphasic         +--------+--------+-----+---------+--------+  Radial               triphasic         +--------+--------+-----+---------+--------+ Ulnar                triphasic         +--------+--------+-----+---------+--------+  Summary: Right Carotid: Velocities in the right ICA are consistent with a 1-39% stenosis. Left Carotid: Velocities in the left ICA are consistent with a 1-39% stenosis. Vertebrals: Bilateral vertebral arteries demonstrate antegrade flow. Right ABI: Resting right ankle-brachial index is within normal range. No evidence of significant right lower extremity arterial disease. Left ABI: Resting left ankle-brachial index is within normal range. No evidence of significant left lower extremity arterial disease. Right Upper Extremity: Doppler waveforms decrease >50% with right radial compression. Doppler waveform obliterate with right ulnar compression. Left Upper Extremity: Doppler waveform obliterate with left radial compression. Doppler waveforms remain within normal limits with left ulnar compression.  Electronically signed  by Monica Martinez MD on 09/12/2019 at 8:06:35 PM.    Final    [place] EKG SITE RITE  Result Date: 09/14/2019 If Site Rite image not attached, placement could not be confirmed due to current cardiac rhythm.  CT Angio Abd/Pel w/ and/or w/o  Result Date: 09/12/2019 CLINICAL DATA:  54 year old male under preoperative evaluation prior to potential redo Bentall procedure. EXAM: CT ANGIOGRAPHY CHEST, ABDOMEN AND PELVIS TECHNIQUE: Non-contrast CT of the chest was initially obtained. Multidetector CT imaging through the chest, abdomen and pelvis was performed using the standard protocol during bolus administration of intravenous contrast. Multiplanar reconstructed images and MIPs were obtained and reviewed to evaluate the vascular anatomy. CONTRAST:  140mL OMNIPAQUE IOHEXOL 350 MG/ML SOLN COMPARISON:  Chest CTA 10/09/2014. CTA of the chest, abdomen and pelvis 07/24/2013. FINDINGS: CTA CHEST FINDINGS Cardiovascular: Heart size is enlarged. There is no significant pericardial fluid, thickening or pericardial calcification. 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 There is a small defect on the undersurface of the mechanical aortic valve best appreciated on coronal image 82 of series 8 measuring approximately 8 mm, which may represent valve associated thrombus and/or vegetation. The aortic arch is mildly aneurysmal measuring up to 4.3 cm in diameter. There is a residual dissection flap in the proximal and mid aortic arch which does not propagate into the great vessels. Descending thoracic aorta is normal in caliber measuring 2.8 cm in diameter. Bovine type thoracic aortic arch incidentally noted. There is a right coronary artery bypass graft. Large filling defect in the superior aspect of the right atrium immediately posterior to the septal leaflet of the tricuspid valve, and intimately associated with the adjacent aortic root, estimated to  measure approximately 2.2 x 2.2 x 1.7 cm (axial image 83 of series 6 and coronal image 73 of series 8), presumably a vegetation. Mediastinum/Nodes: No pathologically enlarged mediastinal or hilar lymph nodes. Esophagus is unremarkable in appearance. No axillary lymphadenopathy. Lungs/Pleura: No suspicious pulmonary nodules or masses are noted. No acute consolidative airspace disease. No pleural effusions. Musculoskeletal: In the T10 vertebral body there is a large lucent lesion with extension of soft tissue into the right paravertebral region and involvement of the head of the right tenth rib overall estimated to measure approximately 4.9 x 3.4 x 2.0 cm (axial image 105 of series 6 and sagittal image 117 of series 9). This causes significant narrowing of the central spinal canal (approximately 5 mm AP) at this level. Median sternotomy wires. Review of  the MIP images confirms the above findings. CTA ABDOMEN AND PELVIS FINDINGS VASCULAR Aorta: Mild aortic atherosclerosis. Normal caliber aorta without aneurysm, dissection, vasculitis or significant stenosis. Celiac: Patent without evidence of aneurysm, dissection, vasculitis or significant stenosis. SMA: Patent without evidence of aneurysm, dissection, vasculitis or significant stenosis. Renals: Both renal arteries are patent without evidence of aneurysm, dissection, vasculitis, fibromuscular dysplasia or significant stenosis. IMA: Patent without evidence of aneurysm, dissection, vasculitis or significant stenosis. Inflow: Patent without evidence of aneurysm, dissection, vasculitis or significant stenosis. Veins: No obvious venous abnormality within the limitations of this arterial phase study. Review of the MIP images confirms the above findings. NON-VASCULAR Hepatobiliary: No suspicious appearing cystic or solid hepatic lesions are confidently identified on today's arterial phase examination. No intra or extrahepatic biliary ductal dilatation. Several partially  calcified gallstones are noted measuring up to 1.6 cm in diameter. Gallbladder is otherwise unremarkable in appearance. Pancreas: No pancreatic mass. No pancreatic ductal dilatation. No pancreatic or peripancreatic fluid collections or inflammatory changes. Spleen: Unremarkable. Adrenals/Urinary Tract: Bilateral kidneys and adrenal glands are normal in appearance. No hydroureteronephrosis. Urinary bladder is normal in appearance. Stomach/Bowel: Normal appearance of the stomach. No pathologic dilatation of small bowel or colon. The appendix is not confidently identified and may be surgically absent. Regardless, there are no inflammatory changes noted adjacent to the cecum to suggest the presence of an acute appendicitis at this time. Lymphatic: No lymphadenopathy noted in the abdomen or pelvis. Reproductive: Prostate gland and seminal vesicles are unremarkable in appearance. Other: No significant volume of ascites.  No pneumoperitoneum. Musculoskeletal: There are no aggressive appearing lytic or blastic lesions noted in the visualized portions of the skeleton. Review of the MIP images confirms the above findings. IMPRESSION: 1. 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 2. Small lesions associated with the undersurface of the mechanical aortic valve and the superior aspect of the septal leaflet of the tricuspid valve, which may reflect valve associated thrombus and/or vegetations, as detailed above. 3. Aggressive appearing lesion in T10 vertebral body with extends in into the right paravertebral soft tissues and involvement of the head of the right tenth rib, with marked narrowing of the central spinal canal at this level, as detailed above. Primary differential considerations include a plasmacytoma, or less likely and osteoblastoma or solitary metastatic  lesion (no primary lesion is confidently identified). If there is clinical concern for spinal cord compression, the full extent of this lesion could be better evaluated with thoracic spine MRI with and without IV gadolinium. 4. Bovine type thoracic aortic arch (normal anatomical variant) incidentally noted. 5. Cholelithiasis without evidence of acute cholecystitis at this time. 6. Additional incidental findings, as above. These results will be called to the ordering clinician or representative by the Radiologist Assistant, and communication documented in the PACS or [employer]. Electronically Signed   By: Vinnie Langton M.D.   On: 09/12/2019 13:02    Labs:  CBC: Recent Labs    09/10/19 0448 09/12/19 0535 09/13/19 0424 09/14/19 0909  WBC 9.4 10.7* 7.7 9.2  HGB 10.5* 10.6* 8.9* 9.8*  HCT 29.7* 29.7* 25.4* 27.6*  PLT 213 252 200 230    COAGS: Recent Labs    09/12/19 0535 09/13/19 0424 09/13/19 1759 09/14/19 0339  INR 3.0* 2.6* 2.5* 2.3*    BMP: Recent Labs    09/11/19 [id number] 09/12/19 0535 09/13/19 0424 09/14/19 [id number]  NA 137 137 138 136  K 3.3* 3.7 3.7 3.8  CL 108 107 107 105  CO2 22 22 23 22   GLUCOSE 203* 199* 160* 249*  BUN 15 13 13 15   CALCIUM 8.4* 8.7* 8.4* 8.7*  CREATININE 1.27* 1.17 1.01 1.21  GFRNONAA >60 >60 >60 >60  GFRAA >60 >60 >60 >60    LIVER FUNCTION TESTS: Recent Labs    04/02/19 1037 04/02/19 1037 09/09/19 0406 09/10/19 0448 09/13/19 0424 09/14/19 0339  BILITOT 0.8  --  1.1  --  0.9 0.8  AST 17  --  23  --  19 21  ALT 10  --  15  --  12 12  ALKPHOS 69  --  80  --  64 70  PROT 7.5  --  7.2  --  6.5 6.8  ALBUMIN 4.0   < > 2.5* 2.6* 2.3* 2.3*   < > = values in this interval not displayed.    TUMOR MARKERS: No results for input(s): AFPTM, CEA, CA199, CHROMGRNA in the last 8760 hours.  Assessment and Plan:  54 y.o. male with past medical history significant for prior aortic dissection repair with AVR, hypothyroidism, coronary artery  disease with prior CABG, CHF, diabetes, hyperlipidemia, hypertension, nephrolithiasis, obesity, paroxysmal atrial fibrillation, prior TIA who presented to [facility] on 4/18 with chills, nausea, vomiting, diarrhea, back pain, dyspnea with exertion and orthopnea. 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 Request now received from neurosurgical service for image guided biopsy of the T10 lesion/possible osteocool with kyphoplasty. Imaging studies were reviewed by Dr. Laurence Ferrari.Risks and benefits of procedure was discussed with the patient  including, but not limited to bleeding, infection, damage to adjacent structures or low yield requiring additional tests.  All of the questions were answered and there is agreement to proceed.  Consent signed and in chart.  Patient scheduled for heart cath on 4/26 and dental extractions tentatively on 4/27 AM; we will tentatively plan case for 4/27 or 4/28 ; patient on IV heparin which will need to be stopped 2-4 hours preprocedure. Latest PT/INR  25/2.3; will recheck 4/27 am   Thank you for this interesting consult.  I greatly enjoyed meeting [name] and look forward to participating in their care.  A copy of this report was sent to the requesting provider on this date.  Electronically Signed: D. Rowe Robert, PA-C 09/14/2019, 3:24 PM   I spent a total of 25 minutes  in face to face in clinical consultation, greater than 50% of which was counseling/coordinating care for image guided T10 lesion/mass biopsy/possible osteocool with kyphoplasty

## 2019-09-14 NOTE — Progress Notes (Signed)
Patient ID: [name], male   DOB: [birth year], 54 y.o.   MRN: [id number] TCTS:  He has a large boil on his butt that will need to be evaluated by surgery and possibly drained.

## 2019-09-14 NOTE — Consult Note (Signed)
[facility] Surgery Consult Note  [name] 1966-04-17  OR:[id number].    Requesting MD: Melven Sartorius Chief Complaint: Nausea, vomiting, diarrhea, shortness of breath Reason for Consult: Buttocks abscess  HPI:  Patient is a 54 year old male presented with the above complaints for 5 days on 09/08/2019.  He has a history of CABG in 2000, ascending aortic dissection in 2000 with repair and mechanical AVR replacement.  Additional medical problems include hypertension, hyperlipidemia proximal atrial fibrillation on Coumadin, history of TIAs 2004, type 2 diabetes.  He notes a swelling and increased discomfort, trouble moving his leg on 09/12/19.  He says a place about the size of a quarter presented.  Last night he went from the chair to the bed and felt something and noted drainage from the left buttocks, with less pain moving his left leg.    Work-up has led to a finding of infective endocarditis Strep Infantarius Bacteremia.  He has an aggressive appearing lesion in T10, neurosurgery is following and planning for IR biopsy.  Nausea, vomiting diarrhea is ongoing.  He also has dyspnea with exertion orthopnea.  He has been seen by CT surgery and is being scheduled for surgery next week.  He was found to have a large bowel on his buttocks and we have been asked to see and evaluate.  He is afebrile vital signs are stable mildly tachycardic at times.  CMP is stable, glucose 249, WBC 9.2, hemoglobin 9.8, hematocrit 27.6, platelets 230,000. CT of the abdomen pelvis on 4/22: did not show an abscess.    ROS: Review of Systems  Constitutional: Positive for fever.  HENT: Negative.   Eyes: Negative.   Cardiovascular: Positive for orthopnea.  Gastrointestinal: Negative.   Genitourinary: Negative.   Musculoskeletal: Negative.   Skin:       Area on left buttocks first noted on 09/12/19  Neurological: Negative.   Endo/Heme/Allergies: Bruises/bleeds easily.  Psychiatric/Behavioral: Negative.      Family History  Problem Relation Age of Onset  . Coronary artery disease Mother   . Hypertension Mother   . Diabetes Brother   . Coronary artery disease Other 10       male 1st degree relative, CABG in his 74's (father)  . Cancer Father        ? lung    Past Medical History:  Diagnosis Date  . Ascending aortic dissection ([place])    a. 2000 - with aortic valve involvement. S/p repair of aortic dissection with placement of mechanical AVR.  [name] CAD (coronary artery disease)    a. 2000 VG->PDA @ time of Ao dissection repair;  b. Cardiac CT 06/2010: no CAD, only mild plaque in prox RCA;  c. 2015 Cath: LM nl, LAD nl, LCX nl, RCA 100, VG->PDA 90 (4.5x12 Rebel BMS). d. 10/23/17 instent restenosis SVG to RCA, DES placed.  . Cholelithiasis 08/22/2013  . Chronic diastolic CHF (congestive heart failure) ([place])    a. 03/2016 Echo: EF 55-60%, no rwma, Gr2 DD.  . Diabetes mellitus   . Dyspnea   . Gross hematuria   . H/O mechanical aortic valve replacement    a. 03/2016 Echo: AoV mean gradient 41mmHg, Valve Area (VTI) 1.36 cm^2, (Vmax) 1.22 cm^2, mod dil LA.  [name] Hyperlipidemia   . Hypertension   . NEPHROLITHIASIS, HX OF   . Obesity   . PAF (paroxysmal atrial fibrillation) ([place])    a. H/o such, with recurrence of coarse afib/flutter during ER consult 03/2012.  . Stroke ([place])   . TRANSIENT  ISCHEMIC ATTACK, HX OF    a. In 2004.  [name] Unspecified vitamin D deficiency     Past Surgical History:  Procedure Laterality Date  . AORTIC VALVE REPLACEMENT  2000  . APPENDECTOMY  1998  . CORONARY ARTERY BYPASS GRAFT  2000  . CORONARY STENT INTERVENTION N/A 10/23/2017   Procedure: CORONARY STENT INTERVENTION;  Surgeon: [name], MD;  Location: [facility];  Service: Cardiovascular;  Laterality: N/A;  . LEFT AND RIGHT HEART CATHETERIZATION WITH CORONARY ANGIOGRAM N/A 07/19/2013   Procedure: LEFT AND RIGHT HEART CATHETERIZATION WITH CORONARY ANGIOGRAM;  Surgeon: [name], MD;   Location: [facility] CATH LAB;  Service: Cardiovascular;  Laterality: N/A;  . LUMBAR LAMINECTOMY/DECOMPRESSION MICRODISCECTOMY Right 06/06/2018   Procedure: Right L3-4 disectomy;  Surgeon: Melina Schools, MD;  Location: [facility];  Service: Orthopedics;  Laterality: Right;  2.5 hrs  . RIGHT HEART CATH AND CORONARY/GRAFT ANGIOGRAPHY N/A 10/23/2017   Procedure: RIGHT HEART CATH AND CORONARY/GRAFT ANGIOGRAPHY;  Surgeon: [name], MD;  Location: [place] [facility];  Service: Cardiovascular;  Laterality: N/A;  . TEE WITHOUT CARDIOVERSION N/A 07/22/2013   Procedure: TRANSESOPHAGEAL ECHOCARDIOGRAM (TEE);  Surgeon: [name], MD;  Location: [facility];  Service: Cardiovascular;  Laterality: N/A;  . TEE WITHOUT CARDIOVERSION N/A 09/11/2019   Procedure: TRANSESOPHAGEAL ECHOCARDIOGRAM (TEE);  Surgeon: Sanda Klein, MD;  Location: [facility] ENDOSCOPY;  Service: Cardiovascular;  Laterality: N/A;    Social History:  reports that he has never smoked. He has never used smokeless tobacco. He reports that he does not drink alcohol or use drugs.  Allergies:  Allergies  Allergen Reactions  . Diltiazem Hives  . Insulin Glargine Swelling    edema    Medications Prior to Admission  Medication Sig Dispense Refill  . aspirin EC 81 MG tablet Take 162 mg by mouth daily as needed (headache).    [name] atorvastatin (LIPITOR) 40 MG tablet TAKE 1 TABLET EVERY DAY  AT  6  PM (Patient taking differently: Take 40 mg by mouth daily with breakfast. ) 90 tablet 1  . cholecalciferol (VITAMIN D) 1000 UNITS tablet Take 1,000 Units by mouth daily with breakfast.     . furosemide (LASIX) 40 MG tablet Take 1 tablet (40 mg total) by mouth 2 (two) times daily. (Patient taking differently: Take 40 mg by mouth 2 (two) times daily with a meal. ) 180 tablet 3  . gabapentin (NEURONTIN) 300 MG capsule Take 1 capsule (300 mg total) by mouth 3 (three) times daily. (Patient taking differently: Take 300 mg by mouth daily with breakfast. ) 90 capsule 0   . glimepiride (AMARYL) 1 MG tablet TAKE 3 TABLETS ONE TIME DAILY WITH BREAKFAST (Patient taking differently: Take 1 mg by mouth daily with breakfast. ) 270 tablet 3  . levothyroxine (SYNTHROID) 50 MCG tablet TAKE 1 TABLET EVERY DAY (Patient taking differently: Take 50 mcg by mouth daily with breakfast. ) 90 tablet 1  . losartan (COZAAR) 100 MG tablet Take 1 tablet (100 mg total) by mouth daily. (Patient taking differently: Take 100 mg by mouth daily with breakfast. ) 90 tablet 1  . metFORMIN (GLUCOPHAGE) 1000 MG tablet Take 1 tablet (1,000 mg total) by mouth 2 (two) times daily with a meal. 180 tablet 3  . metoprolol (TOPROL-XL) 200 MG 24 hr tablet Take 1 tablet (200 mg total) by mouth daily. Take with or immediately following a meal. (Patient taking differently: Take 200 mg by mouth daily with breakfast. Take with or  immediately following a meal.) 90 tablet 3  . nitroGLYCERIN (NITROSTAT) 0.4 MG SL tablet Place 1 tablet (0.4 mg total) under the tongue every 5 (five) minutes x 3 doses as needed for chest pain. 25 tablet 2  . potassium chloride SA (KLOR-CON) 20 MEQ tablet Take 20 mEq by mouth 2 (two) times daily with a meal.    . Vitamin D, Ergocalciferol, (DRISDOL) 1.25 MG (50000 UT) CAPS capsule Take 1 capsule (50,000 Units total) by mouth every 7 (seven) days. (Patient taking differently: Take 50,000 Units by mouth every Tuesday. ) 12 capsule 0  . warfarin (COUMADIN) 5 MG tablet TAKE 1 TO 1 AND 1/2 TABLETS EVERY DAY AS DIRECTED BY COUMADIN CLINIC (Patient taking differently: Take 5-7.5 mg by mouth See admin instructions. Take 1 tablet (5 mg) by mouth on Monday, Wednesday, Friday with supper, take 1 1/2 tablets (7.5 mg) on Sunday, Tuesday, Thursday, Saturday with supper - or as directed by coumadin clinic) 135 tablet 1  . amLODipine (NORVASC) 10 MG tablet Take 1 tablet (10 mg total) by mouth daily. 90 tablet 1  . Blood Glucose Monitoring Suppl (TRUE METRIX AIR GLUCOSE METER) DEVI 1 Device by Does not  apply route daily. E11.9 1 each 1  . clopidogrel (PLAVIX) 75 MG tablet Take 1 tablet (75 mg total) by mouth daily. 90 tablet 1  . glucose blood (TRUE METRIX BLOOD GLUCOSE TEST) test strip Use as directed once daily 100 each 12    Blood pressure 128/78, pulse 88, temperature 98.8 F (37.1 C), temperature source Oral, resp. rate 18, height 6\' 2"  (1.88 m), weight 129 kg, SpO2 98 %. Physical Exam:  General: pleasant, WD,obese  African American male who is laying in bed in NAD HEENT: head is normocephalic, atraumatic.  Sclera are noninjected.  Pupils are equal. Ears and nose without any masses or lesions.  Mouth is pink and moist Heart: regular, rate, and rhythm.  mediansternotomy scar, Mechanical valve sounds, with II-II/VI systolic murmur over the aortic area.  Palpable radial and pedal pulses bilaterally Lungs: CTAB, no wheezes, rhonchi, or rales noted.  Respiratory effort nonlabored Abd: soft, NT, ND, +BS, no masses, hernias, or organomegaly MS: all 4 extremities are symmetrical with no cyanosis, clubbing, or edema. Skin: warm and dry with no masses, lesions, or rashes.  He has a 1 x 5 cm open area left buttocks that has opened and is well drained.  Minimal drainage on the current dressing.  It is tender when explored using sterile applicator stick, but there is no remaining fluctuance at the site.  No significant erythema.   Neuro: Cranial nerves 2-12 grossly intact, sensation is normal throughout Psych: A&Ox3 with an appropriate affect.   Results for orders placed or performed during the hospital encounter of 09/08/19 (from the past 48 hour(s))  Glucose, capillary     Status: Abnormal   Collection Time: 09/12/19  4:45 PM  Result Value Ref Range   Glucose-Capillary 157 (H) 70 - 99 mg/dL    Comment: Glucose reference range applies only to samples taken after fasting for at least 8 hours.  Glucose, capillary     Status: Abnormal   Collection Time: 09/12/19  9:15 PM  Result Value Ref Range    Glucose-Capillary 184 (H) 70 - 99 mg/dL    Comment: Glucose reference range applies only to samples taken after fasting for at least 8 hours.   Comment 1 Notify RN    Comment 2 Document in Chart   Protime-INR  Status: Abnormal   Collection Time: 09/13/19  4:24 AM  Result Value Ref Range   Prothrombin Time 27.9 (H) 11.4 - 15.2 seconds   INR 2.6 (H) 0.8 - 1.2    Comment: (NOTE) INR goal varies based on device and disease states. Performed at [facility], [place] [address] [place], [place] [id number]   Basic metabolic panel     Status: Abnormal   Collection Time: 09/13/19  4:24 AM  Result Value Ref Range   Sodium 138 135 - 145 mmol/L   Potassium 3.7 3.5 - 5.1 mmol/L   Chloride 107 98 - 111 mmol/L   CO2 23 22 - 32 mmol/L   Glucose, Bld 160 (H) 70 - 99 mg/dL    Comment: Glucose reference range applies only to samples taken after fasting for at least 8 hours.   BUN 13 6 - 20 mg/dL   Creatinine, Ser 1.01 0.61 - 1.24 mg/dL   Calcium 8.4 (L) 8.9 - 10.3 mg/dL   GFR calc non Af Amer >60 >60 mL/min   GFR calc Af Amer >60 >60 mL/min   Anion gap 8 5 - 15    Comment: Performed at [facility] [address] [place], [place] [address]  CBC with Differential/Platelet     Status: Abnormal   Collection Time: 09/13/19  4:24 AM  Result Value Ref Range   WBC 7.7 4.0 - 10.5 K/uL   RBC 3.14 (L) 4.22 - 5.81 MIL/uL   Hemoglobin 8.9 (L) 13.0 - 17.0 g/dL   HCT 25.4 (L) 39.0 - 52.0 %   MCV 80.9 80.0 - 100.0 fL   MCH 28.3 26.0 - 34.0 pg   MCHC 35.0 30.0 - 36.0 g/dL   RDW 14.2 11.5 - 15.5 %   Platelets 200 150 - 400 K/uL   nRBC 0.0 0.0 - 0.2 %   Neutrophils Relative % 73 %   Neutro Abs 5.7 1.7 - 7.7 K/uL   Lymphocytes Relative 14 %   Lymphs Abs 1.1 0.7 - 4.0 K/uL   Monocytes Relative 10 %   Monocytes Absolute 0.8 0.1 - 1.0 K/uL   Eosinophils Relative 2 %   Eosinophils Absolute 0.2 0.0 - 0.5 K/uL   Basophils Relative 0 %   Basophils Absolute 0.0 0.0 - 0.1 K/uL   Immature  Granulocytes 1 %   Abs Immature Granulocytes 0.04 0.00 - 0.07 K/uL    Comment: Performed at [facility] [address], [place], [place] [address]  Magnesium     Status: None   Collection Time: 09/13/19  4:24 AM  Result Value Ref Range   Magnesium 1.7 1.7 - 2.4 mg/dL    Comment: Performed at [facility] [address] [place], [place] [address]  Phosphorus     Status: None   Collection Time: 09/13/19  4:24 AM  Result Value Ref Range   Phosphorus 3.3 2.5 - 4.6 mg/dL    Comment: Performed at [facility] [address], [place], [place] [address]  Hepatic function panel     Status: Abnormal   Collection Time: 09/13/19  4:24 AM  Result Value Ref Range   Total Protein 6.5 6.5 - 8.1 g/dL   Albumin 2.3 (L) 3.5 - 5.0 g/dL   AST 19 15 - 41 U/L   ALT 12 0 - 44 U/L   Alkaline Phosphatase 64 38 - 126 U/L   Total Bilirubin 0.9 0.3 - 1.2 mg/dL  Bilirubin, Direct 0.1 0.0 - 0.2 mg/dL   Indirect Bilirubin 0.8 0.3 - 0.9 mg/dL    Comment: Performed at [facility] [address] [place], [place] [address]  Glucose, capillary     Status: Abnormal   Collection Time: 09/13/19  7:28 AM  Result Value Ref Range   Glucose-Capillary 153 (H) 70 - 99 mg/dL    Comment: Glucose reference range applies only to samples taken after fasting for at least 8 hours.  Glucose, capillary     Status: Abnormal   Collection Time: 09/13/19 11:49 AM  Result Value Ref Range   Glucose-Capillary 217 (H) 70 - 99 mg/dL    Comment: Glucose reference range applies only to samples taken after fasting for at least 8 hours.  Glucose, capillary     Status: Abnormal   Collection Time: 09/13/19  4:39 PM  Result Value Ref Range   Glucose-Capillary 216 (H) 70 - 99 mg/dL    Comment: Glucose reference range applies only to samples taken after fasting for at least 8 hours.  Protime-INR     Status: Abnormal   Collection Time: 09/13/19  5:59 PM  Result Value Ref Range   Prothrombin Time 26.6 (H) 11.4 -  15.2 seconds   INR 2.5 (H) 0.8 - 1.2    Comment: (NOTE) INR goal varies based on device and disease states. Performed at [facility], [place] [address] [place], [place] [address]   Glucose, capillary     Status: Abnormal   Collection Time: 09/13/19  9:31 PM  Result Value Ref Range   Glucose-Capillary 157 (H) 70 - 99 mg/dL    Comment: Glucose reference range applies only to samples taken after fasting for at least 8 hours.   Comment 1 Notify RN    Comment 2 Document in Chart   Protime-INR     Status: Abnormal   Collection Time: 09/14/19  3:39 AM  Result Value Ref Range   Prothrombin Time 25.0 (H) 11.4 - 15.2 seconds   INR 2.3 (H) 0.8 - 1.2    Comment: (NOTE) INR goal varies based on device and disease states. Performed at [facility], [place] [address], [place], [place] [address]   Magnesium     Status: None   Collection Time: 09/14/19  3:39 AM  Result Value Ref Range   Magnesium 1.7 1.7 - 2.4 mg/dL    Comment: Performed at [facility] [address], [place], [place] [address]  Phosphorus     Status: None   Collection Time: 09/14/19  3:39 AM  Result Value Ref Range   Phosphorus 3.7 2.5 - 4.6 mg/dL    Comment: Performed at [facility] [address], [place], [place] [address]  Hepatic function panel     Status: Abnormal   Collection Time: 09/14/19  3:39 AM  Result Value Ref Range   Total Protein 6.8 6.5 - 8.1 g/dL   Albumin 2.3 (L) 3.5 - 5.0 g/dL   AST 21 15 - 41 U/L   ALT 12 0 - 44 U/L   Alkaline Phosphatase 70 38 - 126 U/L   Total Bilirubin 0.8 0.3 - 1.2 mg/dL   Bilirubin, Direct 0.2 0.0 - 0.2 mg/dL   Indirect Bilirubin 0.6 0.3 - 0.9 mg/dL    Comment: Performed at [facility] [address], [place], [place] [address]  Glucose, capillary     Status: Abnormal   Collection Time: 09/14/19  8:22  AM  Result Value Ref Range   Glucose-Capillary 225 (H) 70 - 99 mg/dL    Comment: Glucose reference range applies only to samples taken after  fasting for at least 8 hours.  CBC with Differential/Platelet     Status: Abnormal   Collection Time: 09/14/19  9:09 AM  Result Value Ref Range   WBC 9.2 4.0 - 10.5 K/uL   RBC 3.40 (L) 4.22 - 5.81 MIL/uL   Hemoglobin 9.8 (L) 13.0 - 17.0 g/dL   HCT 27.6 (L) 39.0 - 52.0 %   MCV 81.2 80.0 - 100.0 fL   MCH 28.8 26.0 - 34.0 pg   MCHC 35.5 30.0 - 36.0 g/dL   RDW 14.4 11.5 - 15.5 %   Platelets 230 150 - 400 K/uL   nRBC 0.0 0.0 - 0.2 %   Neutrophils Relative % 76 %   Neutro Abs 7.0 1.7 - 7.7 K/uL   Lymphocytes Relative 13 %   Lymphs Abs 1.2 0.7 - 4.0 K/uL   Monocytes Relative 8 %   Monocytes Absolute 0.8 0.1 - 1.0 K/uL   Eosinophils Relative 2 %   Eosinophils Absolute 0.2 0.0 - 0.5 K/uL   Basophils Relative 0 %   Basophils Absolute 0.0 0.0 - 0.1 K/uL   Immature Granulocytes 1 %   Abs Immature Granulocytes 0.10 (H) 0.00 - 0.07 K/uL    Comment: Performed at [facility], [address], [place], [place] [id number]  Basic metabolic panel     Status: Abnormal   Collection Time: 09/14/19  9:09 AM  Result Value Ref Range   Sodium 136 135 - 145 mmol/L   Potassium 3.8 3.5 - 5.1 mmol/L   Chloride 105 98 - 111 mmol/L   CO2 22 22 - 32 mmol/L   Glucose, Bld 249 (H) 70 - 99 mg/dL    Comment: Glucose reference range applies only to samples taken after fasting for at least 8 hours.   BUN 15 6 - 20 mg/dL   Creatinine, Ser 1.21 0.61 - 1.24 mg/dL   Calcium 8.7 (L) 8.9 - 10.3 mg/dL   GFR calc non Af Amer >60 >60 mL/min   GFR calc Af Amer >60 >60 mL/min   Anion gap 9 5 - 15    Comment: Performed at [facility] [address], [place], [place] [address]   MR THORACIC SPINE W WO CONTRAST  Result Date: 09/12/2019 CLINICAL DATA:  Bone lesion EXAM: MRI THORACIC WITHOUT AND WITH CONTRAST TECHNIQUE: Multiplanar and multiecho pulse sequences of the thoracic spine were obtained without and with intravenous contrast. CONTRAST:  49mL GADAVIST GADOBUTROL 1 MMOL/ML IV SOLN COMPARISON:  CTA  chest/abdomen/pelvis 09/12/2019 FINDINGS: MRI THORACIC SPINE FINDINGS The examination is intermittently motion degraded. No scout localizer imaging is provided. Spinal numbering is ascertained by numbering caudally from the C2 level on same-day CTA chest/abdomen/pelvis. Alignment: No significant spondylolisthesis. Vertebrae: There is mild loss of T10 vertebral body height. Vertebral body height is otherwise maintained. There is prominent STIR hyperintensity and enhancement within the central and right aspect of the right T10 vertebral body also extending partly into the right T10 pedicle and transverse process. The mass extends into the right paravertebral soft tissues, with this component measuring 2.1 x 1.7 x 2.6 cm (AP x TV x CC) (series 15, image 54) (series 7, image 2). As demonstrated on CT performed earlier the same day, there is involvement of the head of the right tenth rib. The T10 lesion is slightly  expansile and there is a convex posterior vertebral margin. There are multiple additional subcentimeter foci of enhancement within the thoracic spine which are indeterminate. These are present within the T7, T8 and T12 vertebrae. Cord: No spinal cord signal abnormality or abnormal cord enhancement. No abnormal enhancement is identified within the spinal canal. Paraspinal and other soft tissues: Right paraspinal extension of a T10 vertebral body mass as described above. The paraspinal soft tissues are otherwise unremarkable. Disc levels: No more than mild disc degeneration at any level. At the T10 level, bony expansion and facet hypertrophy contribute to severe spinal canal stenosis with near complete effacement of the thecal sac. There may be mild cord impingement. Bony expansion and facet arthrosis also contributes to moderate/severe right neural foraminal narrowing at T10-T11. At the remaining thoracic levels, there is no focal disc herniation. There is multilevel facet arthrosis and ligamentum flavum  hypertrophy which is greatest in the lower thoracic spine. No more than mild spinal canal narrowing at these remaining levels. No significant foraminal narrowing at the remaining levels. IMPRESSION: Again demonstrated is an aggressive appearing mass centered within the T10 vertebral body also extending into the right T10 pedicle and articular pillar. There is right paraspinal extension of this mass with this component measuring 2.6 cm and involving the head of the right tenth rib. There is mild loss of T10 vertebral body height. Primary differential considerations include plasmacytoma, metastatic disease, osteoblastoma or other aggressive primary bone lesion. Consider direct tissue sampling. Posterior bony expansion and facet hypertrophy contribute to severe spinal canal stenosis at the T10 level with near complete effacement of the thecal sac. Moderate/severe right T10-T11 neural foraminal narrowing. There are subcentimeter foci of enhancement within the T7, T8 and T12 vertebrae which are indeterminate. Electronically Signed   By: Kellie Simmering DO   On: 09/12/2019 20:42   CT ANGIO CHEST AORTA W/CM & OR WO/CM  Result Date: 09/12/2019 CLINICAL DATA:  54 year old male under preoperative evaluation prior to potential redo Bentall procedure. EXAM: CT ANGIOGRAPHY CHEST, ABDOMEN AND PELVIS TECHNIQUE: Non-contrast CT of the chest was initially obtained. Multidetector CT imaging through the chest, abdomen and pelvis was performed using the standard protocol during bolus administration of intravenous contrast. Multiplanar reconstructed images and MIPs were obtained and reviewed to evaluate the vascular anatomy. CONTRAST:  173mL OMNIPAQUE IOHEXOL 350 MG/ML SOLN COMPARISON:  Chest CTA 10/09/2014. CTA of the chest, abdomen and pelvis 07/24/2013. FINDINGS: CTA CHEST FINDINGS Cardiovascular: Heart size is enlarged. There is no significant pericardial fluid, thickening or pericardial calcification. Status post median sternotomy  for Bentall procedure with mechanical aortic valve and ascending thoracic aortic graft. There is a small defect on the undersurface of the mechanical aortic valve best appreciated on coronal image 82 of series 8 measuring approximately 8 mm, which may represent valve associated thrombus and/or vegetation. The aortic arch is mildly aneurysmal measuring up to 4.3 cm in diameter. There is a residual dissection flap in the proximal and mid aortic arch which does not propagate into the great vessels. Descending thoracic aorta is normal in caliber measuring 2.8 cm in diameter. Bovine type thoracic aortic arch incidentally noted. There is a right coronary artery bypass graft. Large filling defect in the superior aspect of the right atrium immediately posterior to the septal leaflet of the tricuspid valve, and intimately associated with the adjacent aortic root, estimated to measure approximately 2.2 x 2.2 x 1.7 cm (axial image 83 of series 6 and coronal image 73 of series 8), presumably a vegetation.  Mediastinum/Nodes: No pathologically enlarged mediastinal or hilar lymph nodes. Esophagus is unremarkable in appearance. No axillary lymphadenopathy. Lungs/Pleura: No suspicious pulmonary nodules or masses are noted. No acute consolidative airspace disease. No pleural effusions. Musculoskeletal: In the T10 vertebral body there is a large lucent lesion with extension of soft tissue into the right paravertebral region and involvement of the head of the right tenth rib overall estimated to measure approximately 4.9 x 3.4 x 2.0 cm (axial image 105 of series 6 and sagittal image 117 of series 9). This causes significant narrowing of the central spinal canal (approximately 5 mm AP) at this level. Median sternotomy wires. Review of the MIP images confirms the above findings. CTA ABDOMEN AND PELVIS FINDINGS VASCULAR Aorta: Mild aortic atherosclerosis. Normal caliber aorta without aneurysm, dissection, vasculitis or significant  stenosis. Celiac: Patent without evidence of aneurysm, dissection, vasculitis or significant stenosis. SMA: Patent without evidence of aneurysm, dissection, vasculitis or significant stenosis. Renals: Both renal arteries are patent without evidence of aneurysm, dissection, vasculitis, fibromuscular dysplasia or significant stenosis. IMA: Patent without evidence of aneurysm, dissection, vasculitis or significant stenosis. Inflow: Patent without evidence of aneurysm, dissection, vasculitis or significant stenosis. Veins: No obvious venous abnormality within the limitations of this arterial phase study. Review of the MIP images confirms the above findings. NON-VASCULAR Hepatobiliary: No suspicious appearing cystic or solid hepatic lesions are confidently identified on today's arterial phase examination. No intra or extrahepatic biliary ductal dilatation. Several partially calcified gallstones are noted measuring up to 1.6 cm in diameter. Gallbladder is otherwise unremarkable in appearance. Pancreas: No pancreatic mass. No pancreatic ductal dilatation. No pancreatic or peripancreatic fluid collections or inflammatory changes. Spleen: Unremarkable. Adrenals/Urinary Tract: Bilateral kidneys and adrenal glands are normal in appearance. No hydroureteronephrosis. Urinary bladder is normal in appearance. Stomach/Bowel: Normal appearance of the stomach. No pathologic dilatation of small bowel or colon. The appendix is not confidently identified and may be surgically absent. Regardless, there are no inflammatory changes noted adjacent to the cecum to suggest the presence of an acute appendicitis at this time. Lymphatic: No lymphadenopathy noted in the abdomen or pelvis. Reproductive: Prostate gland and seminal vesicles are unremarkable in appearance. Other: No significant volume of ascites.  No pneumoperitoneum. Musculoskeletal: There are no aggressive appearing lytic or blastic lesions noted in the visualized portions of the  skeleton. Review of the MIP images confirms the above findings. IMPRESSION: 1. 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 2. Small lesions associated with the undersurface of the mechanical aortic valve and the superior aspect of the septal leaflet of the tricuspid valve, which may reflect valve associated thrombus and/or vegetations, as detailed above. 3. Aggressive appearing lesion in T10 vertebral body with extends in into the right paravertebral soft tissues and involvement of the head of the right tenth rib, with marked narrowing of the central spinal canal at this level, as detailed above. Primary differential considerations include a plasmacytoma, or less likely and osteoblastoma or solitary metastatic lesion (no primary lesion is confidently identified). If there is clinical concern for spinal cord compression, the full extent of this lesion could be better evaluated with thoracic spine MRI with and without IV gadolinium. 4. Bovine type thoracic aortic arch (normal anatomical variant) incidentally noted. 5. Cholelithiasis without evidence of acute cholecystitis at this time. 6. Additional incidental findings, as above. These results will be called to the ordering clinician or representative  by the Radiologist Assistant, and communication documented in the PACS or [employer]. Electronically Signed   By: Vinnie Langton M.D.   On: 09/12/2019 13:02   CT Angio Abd/Pel w/ and/or w/o  Result Date: 09/12/2019 CLINICAL DATA:  54 year old male under preoperative evaluation prior to potential redo Bentall procedure. EXAM: CT ANGIOGRAPHY CHEST, ABDOMEN AND PELVIS TECHNIQUE: Non-contrast CT of the chest was initially obtained. Multidetector CT imaging through the chest, abdomen and pelvis was performed using the standard protocol during bolus  administration of intravenous contrast. Multiplanar reconstructed images and MIPs were obtained and reviewed to evaluate the vascular anatomy. CONTRAST:  144mL OMNIPAQUE IOHEXOL 350 MG/ML SOLN COMPARISON:  Chest CTA 10/09/2014. CTA of the chest, abdomen and pelvis 07/24/2013. FINDINGS: CTA CHEST FINDINGS Cardiovascular: Heart size is enlarged. There is no significant pericardial fluid, thickening or pericardial calcification. 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 There is a small defect on the undersurface of the mechanical aortic valve best appreciated on coronal image 82 of series 8 measuring approximately 8 mm, which may represent valve associated thrombus and/or vegetation. The aortic arch is mildly aneurysmal measuring up to 4.3 cm in diameter. There is a residual dissection flap in the proximal and mid aortic arch which does not propagate into the great vessels. Descending thoracic aorta is normal in caliber measuring 2.8 cm in diameter. Bovine type thoracic aortic arch incidentally noted. There is a right coronary artery bypass graft. Large filling defect in the superior aspect of the right atrium immediately posterior to the septal leaflet of the tricuspid valve, and intimately associated with the adjacent aortic root, estimated to measure approximately 2.2 x 2.2 x 1.7 cm (axial image 83 of series 6 and coronal image 73 of series 8), presumably a vegetation. Mediastinum/Nodes: No pathologically enlarged mediastinal or hilar lymph nodes. Esophagus is unremarkable in appearance. No axillary lymphadenopathy. Lungs/Pleura: No suspicious pulmonary nodules or masses are noted. No acute consolidative airspace disease. No pleural effusions. Musculoskeletal: In the T10 vertebral body there is a large lucent lesion with extension of soft tissue into the right paravertebral region and involvement of the head of the right tenth rib overall  estimated to measure approximately 4.9 x 3.4 x 2.0 cm (axial image 105 of series 6 and sagittal image 117 of series 9). This causes significant narrowing of the central spinal canal (approximately 5 mm AP) at this level. Median sternotomy wires. Review of the MIP images confirms the above findings. CTA ABDOMEN AND PELVIS FINDINGS VASCULAR Aorta: Mild aortic atherosclerosis. Normal caliber aorta without aneurysm, dissection, vasculitis or significant stenosis. Celiac: Patent without evidence of aneurysm, dissection, vasculitis or significant stenosis. SMA: Patent without evidence of aneurysm, dissection, vasculitis or significant stenosis. Renals: Both renal arteries are patent without evidence of aneurysm, dissection, vasculitis, fibromuscular dysplasia or significant stenosis. IMA: Patent without evidence of aneurysm, dissection, vasculitis or significant stenosis. Inflow: Patent without evidence of aneurysm, dissection, vasculitis or significant stenosis. Veins: No obvious venous abnormality within the limitations of this arterial phase study. Review of the MIP images confirms the above findings. NON-VASCULAR Hepatobiliary: No suspicious appearing cystic or solid hepatic lesions are confidently identified on today's arterial phase examination. No intra or extrahepatic biliary ductal dilatation. Several partially calcified gallstones are noted measuring up to 1.6 cm in diameter. Gallbladder is otherwise unremarkable in appearance. Pancreas: No pancreatic mass. No pancreatic ductal dilatation. No pancreatic or peripancreatic fluid collections or inflammatory changes. Spleen: Unremarkable. Adrenals/Urinary Tract: Bilateral kidneys  and adrenal glands are normal in appearance. No hydroureteronephrosis. Urinary bladder is normal in appearance. Stomach/Bowel: Normal appearance of the stomach. No pathologic dilatation of small bowel or colon. The appendix is not confidently identified and may be surgically absent.  Regardless, there are no inflammatory changes noted adjacent to the cecum to suggest the presence of an acute appendicitis at this time. Lymphatic: No lymphadenopathy noted in the abdomen or pelvis. Reproductive: Prostate gland and seminal vesicles are unremarkable in appearance. Other: No significant volume of ascites.  No pneumoperitoneum. Musculoskeletal: There are no aggressive appearing lytic or blastic lesions noted in the visualized portions of the skeleton. Review of the MIP images confirms the above findings. IMPRESSION: 1. 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 2. Small lesions associated with the undersurface of the mechanical aortic valve and the superior aspect of the septal leaflet of the tricuspid valve, which may reflect valve associated thrombus and/or vegetations, as detailed above. 3. Aggressive appearing lesion in T10 vertebral body with extends in into the right paravertebral soft tissues and involvement of the head of the right tenth rib, with marked narrowing of the central spinal canal at this level, as detailed above. Primary differential considerations include a plasmacytoma, or less likely and osteoblastoma or solitary metastatic lesion (no primary lesion is confidently identified). If there is clinical concern for spinal cord compression, the full extent of this lesion could be better evaluated with thoracic spine MRI with and without IV gadolinium. 4. Bovine type thoracic aortic arch (normal anatomical variant) incidentally noted. 5. Cholelithiasis without evidence of acute cholecystitis at this time. 6. Additional incidental findings, as above. These results will be called to the ordering clinician or representative by the Radiologist Assistant, and communication documented in the PACS or [employer]. Electronically  Signed   By: Vinnie Langton M.D.   On: 09/12/2019 13:02   . sodium chloride    . [START ON 09/16/2019] sodium chloride     Followed by  . [START ON 09/16/2019] sodium chloride    . cefTRIAXone (ROCEPHIN)  IV 2 g (09/14/19 1018)  . gentamicin 300 mg (09/13/19 2013)  . heparin 1,500 Units/hr (09/14/19 1038)     Assessment/Plan Infective endocarditis Strep Infantarius Bacteremia T10 vertebral lesion -neurosurgery/IR eval Type 2 diabetes -poor control AKI PAF Hypothyroid Hyperlipidemia Possible sleep apnea Morbid obesity BMI 36.51  Buttocks abscess  FEN: Heart healthy carb modified ID: Vancomycin x2 doses 4/20; Rocephin 4/19>> day 6; gentamicin 4/21>> day 4 DVT: Heparin drip Follow up:  TBD   Plan:  The site is well drained, and open.  I do not think he needs any further I&D of the site.  I am going to start Sitz baths, let him shower and get soap and water into the site, and ask staff to place some 1/4 inch iodoform into site after Sitz bath/shower.  This will help keep it open and allow it to heal from the base.  He is already on Rocephin and Gentamycin.  We can add some flagyl since this is most likely gram negative with mixed flora.     [facility] 09/14/2019, 11:38 AM Please see Amion for pager number during day hours 7:00am-4:30pm

## 2019-09-14 NOTE — Progress Notes (Addendum)
PROGRESS NOTE    [name]  [id number] DOB: 08-Apr-1966 DOA: 09/08/2019 PCP: Biagio Borg, MD    Chief Complaint  Patient presents with  . Shortness of Breath  . Emesis   Brief Narrative:  54 y.o.malewith history of CAD s/p CABG in 2000, SVG-RCA PCI with BMS in Feb 2015, he had re-do PCI in June 2019 DES to SVG graft, ascending aortic dissection in 2000 s/p repair with AVR on warfarin.  Paroxysmal [name]. fib.  NIDDM-2, HTN and HLD presenting with 5 days of chills, nausea, vomiting, diarrhea and dyspnea on exertion and orthopnea mainly with lying on the left side.  In ED, hemodynamically stable and saturating well on room air.  Mild temp to 100.1 F. Cr 1.31 (baseline 1 to 1.1).  BUN 17.  Hgb 10.4 (baseline 11-12).  BNP 535. HS trop 34> 28.  CXR without acute finding.  INR 2.3.  COVID-19 negative.  Cardiology consulted by EDP.  Admitted for nausea, vomiting, diarrhea and DOE.  Echocardiogram ordered by cardiology.  The next day, echo with EF of 55 to 60%, severe RAE, moderate LAE, 1.8 x 1.1 cm tricuspid valve mass on the atrial surface consistent with vegetation and moderate TVR.  Blood cultures with GPC in clusters in 1/2.  BCID with Streptococcus species.  ID consulted and started on ceftriaxone and vancomycin for infective endocarditis/bacteremia.  Repeat blood culture obtained on 4/20.   Assessment & Plan:   Principal Problem:   Suspected endocarditis mechanical aortic valve Active Problems:   Diabetes mellitus type II, non insulin dependent (HCC)   Hyperlipidemia   Essential hypertension   PAF (paroxysmal atrial fibrillation) (HCC)   Hypothyroidism   Normocytic anemia   Shortness of breath   Obesity (BMI 30-39.9)   CAD (coronary artery disease)   Nausea vomiting and diarrhea   Gram-positive cocci bacteremia   Endocarditis of tricuspid valve  Infective endocarditis  Strep Infantarius Bacteremia: Patient with fever and chills.  Echocardiogram with tricuspid valve  vegetation and regurgitation.   - blood cx 4/19 strep infantarius intermediate to penicillin - blood cx 4/20 cx NGTD - ID consulted, appreciate recommendations - continue ceftriaxone/gentamicin - consider colonoscopy prior to surgery for ? Polyps per ID - TEE today with large vegetations on septal leaflet of the tricuspid valve and on the posterior disc of the mechanical aortic valve (see report) - CT surgery c/s, they'll see [name] 4/22 - Repeat blood culture 4/20 NGTD - ok for PICC - orthopantogram with dental caries and periodontal disease 4/21 - CT surgery c/s, planning for surgery in middle to end of next week depending on timing of any dental treatment - planning for dental procedure on 4/27 at 7:30 AM - and cardiac catheterization planed for Monday - CT angio chest abdomen pelvis   Left Buttocks Abscess: spontaneously drained.  Appreciate surgeries assistance, planning to pack wound.  Added flagyl.   Aggressive appearing lesion in T10 vertebral body: plasmacytoma vs osteoblastoma vs solitary metastatic lesion.   Planning for bone bx by IR per neurosurgery -  Addendum: discussed possibility of deferring until after surgery, but after discussion with CT surgery will need to biopsy preop  Nausea/vomiting/diarrhea: Could be due to the above.  GIP negative.  GI symptoms resolved. -Continue symptomatic care -Discontinue enteric precautions  Dyspnea on exertion/orthopnea:echo with EF of 55 to 60%, severe RAE, moderate LAE, 1.8 x 1.1 cm tricuspid valve mass on the atrial surface consistent with vegetation and moderate TVR.  No signs of fluid overload.  BNP slightly elevated but no vascular congestion on CXR.  Has history of asthma but not wheezing or coughing.  INR therapeutic to think of PE.  Overall, respiratory symptoms improved. -Hold home Lasix in the setting of AKI.  CAD s/p CABG in 2000, SVG-RCA PCI with BMS in Feb 2015, he had re-do PCI in June 2019 DES to SVG graft:  Echocardiogram as above. -Continue home ASA and statin.   -Plavix on hold in the setting of #1.  Last dose on 4/19 -Metoprolol, losartan and Lasix on hold due to AKI and soft blood pressures  Elevated troponin: Likely demand ischemia in the setting of the above.  No exertional chest pain. -Cardiac meds as above  Ascending aortic dissection in 2000 s/p repair with Mechanical AVR on warfarin.  INR therapeutic. - heparin gtt for INR <2.5, started 4/24 -Monitor fluid status and renal function.  Paroxysmal [name]. fib.  Rate controlled on metoprolol. -Metoprolol on hold per cardiology. -Anticoagulation as above.  Uncontrolled NIDDM-2 with hyperglycemia: A1c 8.2%. -Continue SSI-moderate -Start Levemir 6 units twice daily and NovoLog 6 units AC -Continue statin.  Check lipid panel  Acute kidney injury: Likely due to hypotension and nephrotoxic meds (vancomycin, Lasix and losartan) -Holding metoprolol, Lasix and losartan -Continue monitoring  Essential HTN:  -metoprolol, amlodipine and losartan on hold.  BP's reasonable  HLD  -Check lipid panel -Continue atorvastatin.  Hypothyroidism: -Continue home Synthroid.  At risk for sleep apnea: STOP BANG score 6-7 which puts him at high risk for sleep apnea. -Needs outpatient sleep study.  Morbid obesity: Body mass index is 36.03 kg/m.  With comorbidities as above. -Encourage lifestyle change to lose weight.  DVT prophylaxis: heparin to start when INR decreases < 2.5 Code Status: full  Family Communication: wife at bedside Disposition:   Status is: Inpatient  Remains inpatient appropriate because:Inpatient level of care appropriate due to severity of illness   Dispo: The patient is from: Home              Anticipated d/c is to: pending              Anticipated d/c date is: > 3 days              Patient currently is not medically stable to d/c.   Consultants:   ID  Cardiology  CT surgery  Procedures:    Transesophageal Echo IMPRESSIONS    1. Left ventricular ejection fraction, by estimation, is 55 to 60%. The  left ventricle has normal function. The left ventricle has no regional  wall motion abnormalities. There is mild concentric left ventricular  hypertrophy.  2. Right ventricular systolic function is normal. The right ventricular  size is mildly enlarged. There is mildly elevated pulmonary artery  systolic pressure. The estimated right ventricular systolic pressure is  [id number] mmHg.  3. It appears that the left atrial appendage has been surgically resected  or oversown. Left atrial size was severely dilated. No left atrial/left  atrial appendage thrombus was detected. The LAA emptying velocity was 35  cm/s.  4. Right atrial size was severely dilated.  5. The mitral valve is normal in structure. Mild mitral valve  regurgitation.  6. There is Perrin Gens large, moderately mobile vegetation attached to the septal  leaflet of the tricuspid valve, measuring 27 mm in length and 11 mm in  width. It appears heterogeneous and friable.. Tricuspid valve  regurgitation is moderate.  7. There is Krishon Adkison large vegetation attached to the medial aspect of  posterior  disc or the posteromedial mechanical valve annulus. It is highly mobile  and prolapses readily across the valve orifice. It measures 15 mm in  length and 6 mm in width. Although  there is no evidence of Melaysia Streed periannular abscess cavity, the periannular  tissue echodensity is heterogeneous, raining concern for  inflammation/early abscess formation. The proximity of the aortic and the  tricuspid vegetation to the same area of the  interventricular septum also raises concern for intramyocardial abscess or  fistula formation. The aortic valve has been repaired/replaced. Aortic  valve regurgitation is trivial. Mild aortic valve stenosis. There is Pearlena Ow  unknown [employer] tilting disk valve  present in the aortic position. Procedure Date: 2000. Aortic  valve mean  gradient measures 20.0 mmHg.  8. There is mild (Grade II) atheroma plaque involving the transverse  aorta.   Comparison(s): Compared to previous reports, there is evidence of  vegetations on the tricuspid valve and the mechanical aortic valve and  suspicion for early perivalvular abscess. The dimensionless aortic vale  index is similar to older studies. This  suggests that the increased aortic valve gradients are due to increased  cardiac output (rather than obstruction from the vegetation or compromised  prosthetic disc motion).   Echo IMPRESSIONS    1. Tricuspid valve vegetation. Discussed with referring provider.  2. Left ventricular ejection fraction, by estimation, is 55 to 60%. The  left ventricle has normal function. The left ventricle has no regional  wall motion abnormalities. Left ventricular diastolic parameters were  normal.  3. Right ventricular systolic function is normal. The right ventricular  size is mildly enlarged. There is normal pulmonary artery systolic  pressure.  4. Left atrial size was moderately dilated.  5. Right atrial size was severely dilated.  6. The mitral valve is normal in structure. Mild mitral valve  regurgitation. No evidence of mitral stenosis.  7. There is Shem Plemmons 1.8 x 1.1 cm tricuspid valve mass on the atrial surface  consistent with vegetation. . Tricuspid valve regurgitation is moderate.  8. Transaortic velocity is higher than expected for mechanical aortic  valve. The aortic valve is normal in structure. Aortic valve regurgitation  is not visualized. No aortic stenosis is present. There is Harish Bram mechanical  valve present in the aortic  position. Procedure Date: 2000. Aortic valve mean gradient measures 33.5  mmHg. Aortic valve Vmax measures 3.68 m/s.  9. Aortic root/ascending aorta has been repaired/replaced and [place].  10. The inferior vena cava is normal in size with greater than 50%  respiratory variability,  suggesting right atrial pressure of 3 mmHg.  Antimicrobials:  Anti-infectives (From admission, onward)   Start     Dose/Rate Route Frequency Ordered Stop   09/14/19 1430  metroNIDAZOLE (FLAGYL) tablet 500 mg     500 mg Oral Every 8 hours 09/14/19 1348     09/11/19 1700  gentamicin (GARAMYCIN) 300 mg in dextrose 5 % 50 mL IVPB     300 mg 115 mL/hr over 30 Minutes Intravenous Every 24 hours 09/11/19 1540     09/10/19 2300  vancomycin (VANCOREADY) IVPB 1750 mg/350 mL  Status:  Discontinued     1,750 mg 175 mL/hr over 120 Minutes Intravenous Every 24 hours 09/10/19 0721 09/11/19 1511   09/10/19 0600  vancomycin (VANCOREADY) IVPB 1250 mg/250 mL  Status:  Discontinued     1,250 mg 166.7 mL/hr over 90 Minutes Intravenous Every 12 hours 09/09/19 1345 09/10/19 0721   09/09/19 1430  cefTRIAXone (ROCEPHIN) 2 g in  sodium chloride 0.9 % 100 mL IVPB     2 g 200 mL/hr over 30 Minutes Intravenous Every 24 hours 09/09/19 1335     09/09/19 1430  vancomycin (VANCOCIN) 2,500 mg in sodium chloride 0.9 % 500 mL IVPB     2,500 mg 250 mL/hr over 120 Minutes Intravenous  Once 09/09/19 1345 09/09/19 1847      Subjective: No new complaints today Had boil on his butt, spontaneously drained today  Objective: Vitals:   09/13/19 0500 09/13/19 1548 09/13/19 2039 09/14/19 0506  BP:  130/79 129/74 128/78  Pulse:  94 94 88  Resp:  16 18 18   Temp:  98.4 F (36.9 C) 99.2 F (37.3 C) 98.8 F (37.1 C)  TempSrc:  Oral Oral Oral  SpO2:  100% 100% 98%  Weight: 129.1 kg   129 kg  Height:        Intake/Output Summary (Last 24 hours) at 09/14/2019 1440 Last data filed at 09/14/2019 0500 Gross per 24 hour  Intake 3 ml  Output 500 ml  Net -497 ml   Filed Weights   09/12/19 0356 09/13/19 0500 09/14/19 0506  Weight: 127.5 kg 129.1 kg 129 kg    Examination:  General: No acute distress. Cardiovascular: Heart sounds show Murrel Bertram regular rate, and rhythm.  Lungs: Clear to auscultation bilaterally. Abdomen: Soft,  nontender, nondistended Neurological: Alert and oriented 3. Moves all extremities 4. Cranial nerves II through XII grossly intact. Skin: Warm and dry. No rashes or lesions. L buttocks with abscess which appears to have spontaneously drained Extremities: No clubbing or cyanosis. No edema. P   Data Reviewed: I have personally reviewed following labs and imaging studies  CBC: Recent Labs  Lab 09/09/19 0406 09/10/19 0448 09/12/19 0535 09/13/19 0424 09/14/19 0909  WBC 7.7 9.4 10.7* 7.7 9.2  NEUTROABS  --   --  8.1* 5.7 7.0  HGB 10.2* 10.5* 10.6* 8.9* 9.8*  HCT 28.7* 29.7* 29.7* 25.4* 27.6*  MCV 81.3 80.7 80.5 80.9 81.2  PLT 194 213 252 200 [id number]    Basic Metabolic Panel: Recent Labs  Lab 09/08/19 1716 09/09/19 0406 09/10/19 0448 09/11/19 0508 09/12/19 0535 09/13/19 0424 09/14/19 0339 09/14/19 0909  NA  --    < > 137 137 137 138  --  136  K  --    < > 3.5 3.3* 3.7 3.7  --  3.8  CL  --    < > 104 108 107 107  --  105  CO2  --    < > 23 22 22 23   --  22  GLUCOSE  --    < > 181* 203* 199* 160*  --  249*  BUN  --    < > 16 15 13 13   --  15  CREATININE  --    < > 1.50* 1.27* 1.17 1.01  --  1.21  CALCIUM  --    < > 8.6* 8.4* 8.7* 8.4*  --  8.7*  MG 1.6*  --  1.5*  --  1.7 1.7 1.7  --   PHOS  --   --  3.2  --  2.7 3.3 3.7  --    < > = values in this interval not displayed.    GFR: Estimated Creatinine Clearance: 99.6 mL/min (by C-G formula based on SCr of 1.21 mg/dL).  Liver Function Tests: Recent Labs  Lab 09/09/19 0406 09/10/19 0448 09/13/19 0424 09/14/19 0339  AST 23  --  19 21  ALT 15  --  12 12  ALKPHOS 80  --  64 70  BILITOT 1.1  --  0.9 0.8  PROT 7.2  --  6.5 6.8  ALBUMIN 2.5* 2.6* 2.3* 2.3*    CBG: Recent Labs  Lab 09/13/19 1149 09/13/19 1639 09/13/19 2131 09/14/19 0822 09/14/19 1139  GLUCAP 217* 216* 157* 225* 166*     Recent Results (from the past 240 hour(s))  SARS CORONAVIRUS 2 (TAT 6-24 HRS) Nasopharyngeal Nasopharyngeal Swab     Status:  None   Collection Time: 09/08/19  1:50 PM   Specimen: Nasopharyngeal Swab  Result Value Ref Range Status   SARS Coronavirus 2 NEGATIVE NEGATIVE Final    Comment: (NOTE) SARS-CoV-2 target nucleic acids are NOT DETECTED. The SARS-CoV-2 RNA is generally detectable in upper and lower respiratory specimens during the acute phase of infection. Negative results do not preclude SARS-CoV-2 infection, do not rule out co-infections with other pathogens, and should not be used as the sole basis for treatment or other patient management decisions. Negative results must be combined with clinical observations, patient history, and epidemiological information. The expected result is Negative. Fact Sheet for Patients: SugarRoll.be Fact Sheet for Healthcare Providers: https://www.woods-mathews.com/ This test is not yet approved or cleared by the [place] FDA and  has been authorized for detection and/or diagnosis of SARS-CoV-2 by FDA under an Emergency Use Authorization (EUA). This EUA will remain  in effect (meaning this test can be used) for the duration of the COVID-19 declaration under Section 56 4(b)(1) of the Act, 21 U.S.C. section 360bbb-3(b)(1), unless the authorization is terminated or revoked sooner. Performed at [facility], [place] [address], [place], [place] [address]   Gastrointestinal Panel by PCR , Stool     Status: None   Collection Time: 09/09/19 12:21 PM   Specimen: Stool  Result Value Ref Range Status   Campylobacter species NOT DETECTED NOT DETECTED Final   Plesimonas shigelloides NOT DETECTED NOT DETECTED Final   Salmonella species NOT DETECTED NOT DETECTED Final   Yersinia enterocolitica NOT DETECTED NOT DETECTED Final   Vibrio species NOT DETECTED NOT DETECTED Final   Vibrio cholerae NOT DETECTED NOT DETECTED Final   Enteroaggregative E coli (EAEC) NOT DETECTED NOT DETECTED Final   Enteropathogenic E coli (EPEC) NOT  DETECTED NOT DETECTED Final   Enterotoxigenic E coli (ETEC) NOT DETECTED NOT DETECTED Final   Shiga like toxin producing E coli (STEC) NOT DETECTED NOT DETECTED Final   Shigella/Enteroinvasive E coli (EIEC) NOT DETECTED NOT DETECTED Final   Cryptosporidium NOT DETECTED NOT DETECTED Final   Cyclospora cayetanensis NOT DETECTED NOT DETECTED Final   Entamoeba histolytica NOT DETECTED NOT DETECTED Final   Giardia lamblia NOT DETECTED NOT DETECTED Final   Adenovirus F40/41 NOT DETECTED NOT DETECTED Final   Astrovirus NOT DETECTED NOT DETECTED Final   Norovirus GI/GII NOT DETECTED NOT DETECTED Final   Rotavirus Bjorn Hallas NOT DETECTED NOT DETECTED Final   Sapovirus (I, II, IV, and V) NOT DETECTED NOT DETECTED Final    Comment: Performed at [facility], [address] [place], [place] [address]  Culture, blood (Routine X 2) w Reflex to ID Panel     Status: Abnormal   Collection Time: 09/09/19  1:12 PM   Specimen: BLOOD  Result Value Ref Range Status   Specimen Description BLOOD RIGHT ANTECUBITAL  Final   Special Requests   Final    BOTTLES DRAWN AEROBIC AND ANAEROBIC Blood Culture adequate volume Performed at [facility], [place] [address] , [place] [address]    Culture  Setup Time   Final    GRAM POSITIVE COCCI ANAEROBIC BOTTLE ONLY CRITICAL RESULT CALLED TO, READ BACK BY AND VERIFIED WITH: PHARMD SEAY LAURA AT 0546  BY MESSAN HOUEGNIFIO ON 09/10/2019     Culture STREPTOCOCCUS INFANTARIUS (Lona Six)  Final   Report Status 09/12/2019 FINAL  Final   Organism ID, Bacteria STREPTOCOCCUS INFANTARIUS  Final      Susceptibility   Streptococcus infantarius - MIC*    PENICILLIN 0.25 INTERMEDIATE Intermediate     CEFTRIAXONE 0.25 SENSITIVE Sensitive     ERYTHROMYCIN <=0.12 SENSITIVE Sensitive     LEVOFLOXACIN 2 SENSITIVE Sensitive     VANCOMYCIN <=0.12 SENSITIVE Sensitive     * STREPTOCOCCUS INFANTARIUS  Culture, blood (Routine X 2) w Reflex to ID Panel     Status: Abnormal   Collection  Time: 09/09/19  1:12 PM   Specimen: BLOOD LEFT HAND  Result Value Ref Range Status   Specimen Description BLOOD LEFT HAND  Final   Special Requests   Final    BOTTLES DRAWN AEROBIC AND ANAEROBIC Blood Culture adequate volume   Culture  Setup Time   Final    AEROBIC BOTTLE ONLY GRAM POSITIVE COCCI IN CHAINS GRAM POSITIVE COCCI IN CLUSTERS CRITICAL VALUE NOTED.  VALUE IS CONSISTENT WITH PREVIOUSLY REPORTED AND CALLED VALUE.    Culture (Jearldean Gutt)  Final    STREPTOCOCCUS INFANTARIUS SUSCEPTIBILITIES PERFORMED ON PREVIOUS CULTURE WITHIN THE LAST 5 DAYS. Performed at [facility], [place] [address], [place], [place] [address]    Report Status 09/12/2019 FINAL  Final  Blood Culture ID Panel (Reflexed)     Status: Abnormal   Collection Time: 09/09/19  1:12 PM  Result Value Ref Range Status   Enterococcus species NOT DETECTED NOT DETECTED Final   Listeria monocytogenes NOT DETECTED NOT DETECTED Final   Staphylococcus species NOT DETECTED NOT DETECTED Final   Staphylococcus aureus (BCID) NOT DETECTED NOT DETECTED Final   Streptococcus species DETECTED (Naveena Eyman) NOT DETECTED Final    Comment: Not Enterococcus species, Streptococcus agalactiae, Streptococcus pyogenes, or Streptococcus pneumoniae. CRITICAL RESULT CALLED TO, READ BACK BY AND VERIFIED WITH: Criss Rosales PharmD 12:50 09/10/19 ([email or web address])    Streptococcus agalactiae NOT DETECTED NOT DETECTED Final   Streptococcus pneumoniae NOT DETECTED NOT DETECTED Final   Streptococcus pyogenes NOT DETECTED NOT DETECTED Final   Acinetobacter baumannii NOT DETECTED NOT DETECTED Final   Enterobacteriaceae species NOT DETECTED NOT DETECTED Final   Enterobacter cloacae complex NOT DETECTED NOT DETECTED Final   Escherichia coli NOT DETECTED NOT DETECTED Final   Klebsiella oxytoca NOT DETECTED NOT DETECTED Final   Klebsiella pneumoniae NOT DETECTED NOT DETECTED Final   Proteus species NOT DETECTED NOT DETECTED Final   Serratia marcescens NOT DETECTED NOT DETECTED  Final   Haemophilus influenzae NOT DETECTED NOT DETECTED Final   Neisseria meningitidis NOT DETECTED NOT DETECTED Final   Pseudomonas aeruginosa NOT DETECTED NOT DETECTED Final   Candida albicans NOT DETECTED NOT DETECTED Final   Candida glabrata NOT DETECTED NOT DETECTED Final   Candida krusei NOT DETECTED NOT DETECTED Final   Candida parapsilosis NOT DETECTED NOT DETECTED Final   Candida tropicalis NOT DETECTED NOT DETECTED Final    Comment: Performed at [facility], [place], [place], [place] [address]  Culture, blood (routine x 2)     Status: None (Preliminary result)   Collection Time: 09/10/19  2:46 PM  Specimen: BLOOD RIGHT FOREARM  Result Value Ref Range Status   Specimen Description BLOOD RIGHT FOREARM  Final   Special Requests   Final    BOTTLES DRAWN AEROBIC AND ANAEROBIC Blood Culture adequate volume   Culture   Final    NO GROWTH 4 DAYS Performed at [facility], [address] [place], [place] [address]    Report Status PENDING  Incomplete  Culture, blood (routine x 2)     Status: None (Preliminary result)   Collection Time: 09/10/19  2:51 PM   Specimen: BLOOD RIGHT WRIST  Result Value Ref Range Status   Specimen Description BLOOD RIGHT WRIST  Final   Special Requests   Final    BOTTLES DRAWN AEROBIC ONLY Blood Culture adequate volume   Culture   Final    NO GROWTH 4 DAYS Performed at [facility], [place] [address] [place], [place] [address]    Report Status PENDING  Incomplete         Radiology Studies: MR THORACIC SPINE W WO CONTRAST  Result Date: 09/12/2019 CLINICAL DATA:  Bone lesion EXAM: MRI THORACIC WITHOUT AND WITH CONTRAST TECHNIQUE: Multiplanar and multiecho pulse sequences of the thoracic spine were obtained without and with intravenous contrast. CONTRAST:  45mL GADAVIST GADOBUTROL 1 MMOL/ML IV SOLN COMPARISON:  CTA chest/abdomen/pelvis 09/12/2019 FINDINGS: MRI THORACIC SPINE FINDINGS The examination is intermittently  motion degraded. No scout localizer imaging is provided. Spinal numbering is ascertained by numbering caudally from the C2 level on same-day CTA chest/abdomen/pelvis. Alignment: No significant spondylolisthesis. Vertebrae: There is mild loss of T10 vertebral body height. Vertebral body height is otherwise maintained. There is prominent STIR hyperintensity and enhancement within the central and right aspect of the right T10 vertebral body also extending partly into the right T10 pedicle and transverse process. The mass extends into the right paravertebral soft tissues, with this component measuring 2.1 x 1.7 x 2.6 cm (AP x TV x CC) (series 15, image 54) (series 7, image 2). As demonstrated on CT performed earlier the same day, there is involvement of the head of the right tenth rib. The T10 lesion is slightly expansile and there is Marijayne Rauth convex posterior vertebral margin. There are multiple additional subcentimeter foci of enhancement within the thoracic spine which are indeterminate. These are present within the T7, T8 and T12 vertebrae. Cord: No spinal cord signal abnormality or abnormal cord enhancement. No abnormal enhancement is identified within the spinal canal. Paraspinal and other soft tissues: Right paraspinal extension of Keniya Schlotterbeck T10 vertebral body mass as described above. The paraspinal soft tissues are otherwise unremarkable. Disc levels: No more than mild disc degeneration at any level. At the T10 level, bony expansion and facet hypertrophy contribute to severe spinal canal stenosis with near complete effacement of the thecal sac. There may be mild cord impingement. Bony expansion and facet arthrosis also contributes to moderate/severe right neural foraminal narrowing at T10-T11. At the remaining thoracic levels, there is no focal disc herniation. There is multilevel facet arthrosis and ligamentum flavum hypertrophy which is greatest in the lower thoracic spine. No more than mild spinal canal narrowing at these  remaining levels. No significant foraminal narrowing at the remaining levels. IMPRESSION: Again demonstrated is an aggressive appearing mass centered within the T10 vertebral body also extending into the right T10 pedicle and articular pillar. There is right paraspinal extension of this mass with this component measuring 2.6 cm and involving the head of the right tenth rib. There is mild  loss of T10 vertebral body height. Primary differential considerations include plasmacytoma, metastatic disease, osteoblastoma or other aggressive primary bone lesion. Consider direct tissue sampling. Posterior bony expansion and facet hypertrophy contribute to severe spinal canal stenosis at the T10 level with near complete effacement of the thecal sac. Moderate/severe right T10-T11 neural foraminal narrowing. There are subcentimeter foci of enhancement within the T7, T8 and T12 vertebrae which are indeterminate. Electronically Signed   By: Kellie Simmering DO   On: 09/12/2019 20:42        Scheduled Meds: . Derrill Memo ON 09/16/2019] aspirin  81 mg Oral Pre-Cath  . aspirin EC  81 mg Oral Daily  . atorvastatin  40 mg Oral Q breakfast  . gabapentin  300 mg Oral Q breakfast  . insulin aspart  0-15 Units Subcutaneous TID WC  . insulin aspart  0-5 Units Subcutaneous QHS  . insulin aspart  6 Units Subcutaneous TID WC  . insulin detemir  6 Units Subcutaneous BID  . levothyroxine  50 mcg Oral Q0600  . metroNIDAZOLE  500 mg Oral Q8H  . polyethylene glycol  17 g Oral BID  . sodium chloride flush  3 mL Intravenous Once  . sodium chloride flush  3 mL Intravenous Q12H   Continuous Infusions: . sodium chloride    . [START ON 09/16/2019] sodium chloride     Followed by  . [START ON 09/16/2019] sodium chloride    . cefTRIAXone (ROCEPHIN)  IV 2 g (09/14/19 1018)  . gentamicin 300 mg (09/13/19 2013)  . heparin 1,500 Units/hr (09/14/19 1038)     LOS: 6 days    Time spent: over 30 min    Fayrene Helper, MD [facility]   To contact the attending provider between 7A-7P or the covering provider during after hours 7P-7A, please log into the web site [email or web address] and access using universal [place] password for that web site. If you do not have the password, please call the hospital operator.  09/14/2019, 2:40 PM

## 2019-09-15 ENCOUNTER — Inpatient Hospital Stay (HOSPITAL_COMMUNITY): Payer: Medicare PPO

## 2019-09-15 DIAGNOSIS — I079 Rheumatic tricuspid valve disease, unspecified: Secondary | ICD-10-CM | POA: Diagnosis not present

## 2019-09-15 DIAGNOSIS — I480 Paroxysmal atrial fibrillation: Secondary | ICD-10-CM | POA: Diagnosis not present

## 2019-09-15 DIAGNOSIS — I330 Acute and subacute infective endocarditis: Secondary | ICD-10-CM | POA: Diagnosis not present

## 2019-09-15 DIAGNOSIS — I25810 Atherosclerosis of coronary artery bypass graft(s) without angina pectoris: Secondary | ICD-10-CM | POA: Diagnosis not present

## 2019-09-15 LAB — CBC WITH DIFFERENTIAL/PLATELET
Abs Immature Granulocytes: 0.09 10*3/uL — ABNORMAL HIGH (ref 0.00–0.07)
Basophils Absolute: 0 10*3/uL (ref 0.0–0.1)
Basophils Relative: 0 %
Eosinophils Absolute: 0.1 10*3/uL (ref 0.0–0.5)
Eosinophils Relative: 2 %
HCT: 25 % — ABNORMAL LOW (ref 39.0–52.0)
Hemoglobin: 8.9 g/dL — ABNORMAL LOW (ref 13.0–17.0)
Immature Granulocytes: 1 %
Lymphocytes Relative: 16 %
Lymphs Abs: 1.3 10*3/uL (ref 0.7–4.0)
MCH: 29.2 pg (ref 26.0–34.0)
MCHC: 35.6 g/dL (ref 30.0–36.0)
MCV: 82 fL (ref 80.0–100.0)
Monocytes Absolute: 0.8 10*3/uL (ref 0.1–1.0)
Monocytes Relative: 9 %
Neutro Abs: 6.2 10*3/uL (ref 1.7–7.7)
Neutrophils Relative %: 72 %
Platelets: 212 10*3/uL (ref 150–400)
RBC: 3.05 MIL/uL — ABNORMAL LOW (ref 4.22–5.81)
RDW: 14.4 % (ref 11.5–15.5)
WBC: 8.6 10*3/uL (ref 4.0–10.5)
nRBC: 0 % (ref 0.0–0.2)

## 2019-09-15 LAB — GLUCOSE, CAPILLARY
Glucose-Capillary: 153 mg/dL — ABNORMAL HIGH (ref 70–99)
Glucose-Capillary: 197 mg/dL — ABNORMAL HIGH (ref 70–99)
Glucose-Capillary: 185 mg/dL — ABNORMAL HIGH (ref 70–99)
Glucose-Capillary: 154 mg/dL — ABNORMAL HIGH (ref 70–99)

## 2019-09-15 LAB — CULTURE, BLOOD (ROUTINE X 2)
Culture: NO GROWTH
Culture: NO GROWTH
Special Requests: ADEQUATE
Special Requests: ADEQUATE

## 2019-09-15 LAB — PROTIME-INR
INR: 2 — ABNORMAL HIGH (ref 0.8–1.2)
Prothrombin Time: 22.7 seconds — ABNORMAL HIGH (ref 11.4–15.2)

## 2019-09-15 LAB — COMPREHENSIVE METABOLIC PANEL
ALT: 13 U/L (ref 0–44)
AST: 21 U/L (ref 15–41)
Albumin: 2.4 g/dL — ABNORMAL LOW (ref 3.5–5.0)
Alkaline Phosphatase: 67 U/L (ref 38–126)
Anion gap: 8 (ref 5–15)
BUN: 13 mg/dL (ref 6–20)
CO2: 23 mmol/L (ref 22–32)
Calcium: 8.4 mg/dL — ABNORMAL LOW (ref 8.9–10.3)
Chloride: 106 mmol/L (ref 98–111)
Creatinine, Ser: 1.21 mg/dL (ref 0.61–1.24)
GFR calc Af Amer: >60 mL/min (ref >60)
GFR calc non Af Amer: >60 mL/min (ref >60)
Glucose, Bld: 142 mg/dL — ABNORMAL HIGH (ref 70–99)
Potassium: 3.8 mmol/L (ref 3.5–5.1)
Sodium: 137 mmol/L (ref 135–145)
Total Bilirubin: 1.1 mg/dL (ref 0.3–1.2)
Total Protein: 6.6 g/dL (ref 6.5–8.1)

## 2019-09-15 LAB — GENTAMICIN LEVEL, TROUGH: Gentamicin Trough: 5.5 ug/mL (ref 0.5–2.0)

## 2019-09-15 LAB — URIC ACID: Uric Acid, Serum: 7.1 mg/dL (ref 3.7–8.6)

## 2019-09-15 LAB — HEPARIN LEVEL (UNFRACTIONATED)
Heparin Unfractionated: 0.28 IU/mL — ABNORMAL LOW (ref 0.30–0.70)
Heparin Unfractionated: 0.35 IU/mL (ref 0.30–0.70)

## 2019-09-15 LAB — PHOSPHORUS: Phosphorus: 3.7 mg/dL (ref 2.5–4.6)

## 2019-09-15 LAB — MAGNESIUM: Magnesium: 1.7 mg/dL (ref 1.7–2.4)

## 2019-09-15 IMAGING — DX DG FOOT COMPLETE 3+V*L*
3 series · 3 of 3 positions shown · non-contrast
Comparison: None.

CLINICAL DATA: Gout.

EXAM:
LEFT FOOT - COMPLETE 3+ VIEW

[foot ap]
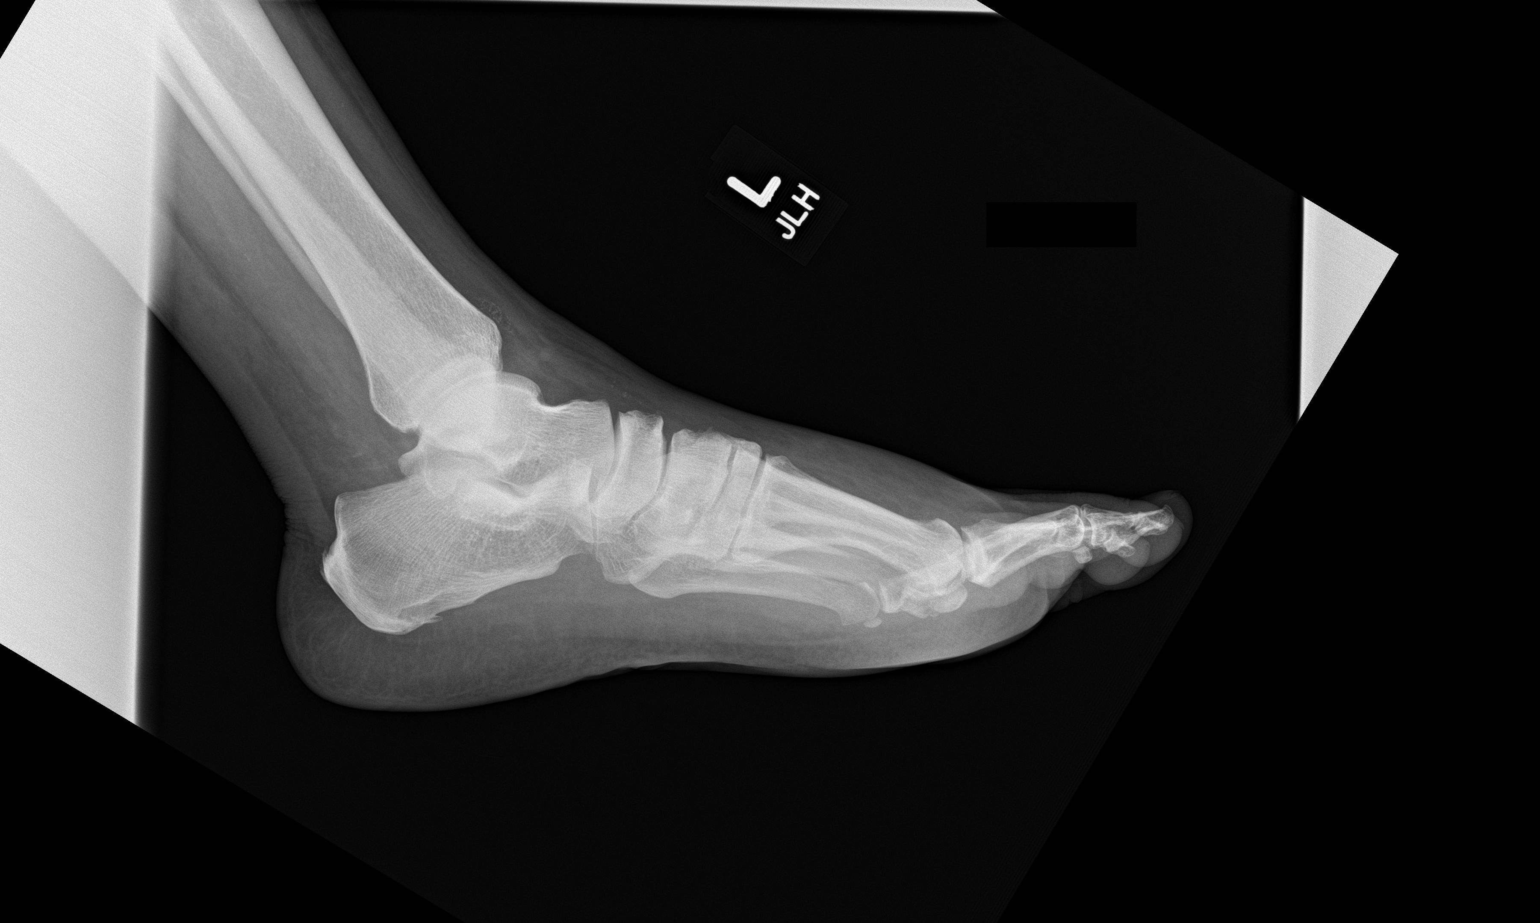

[foot obl]
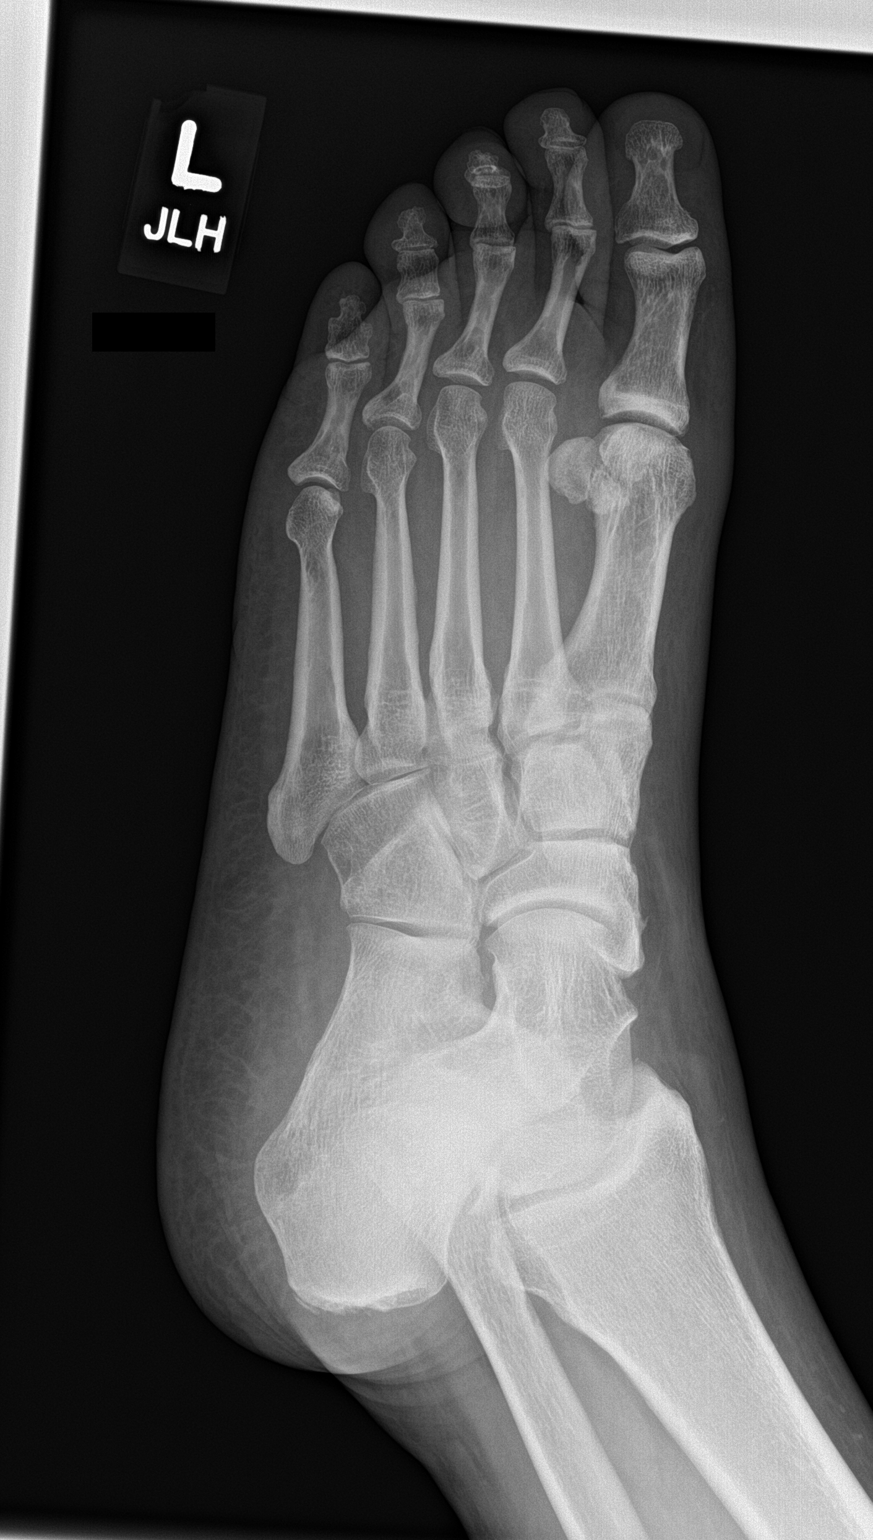

[foot lat]
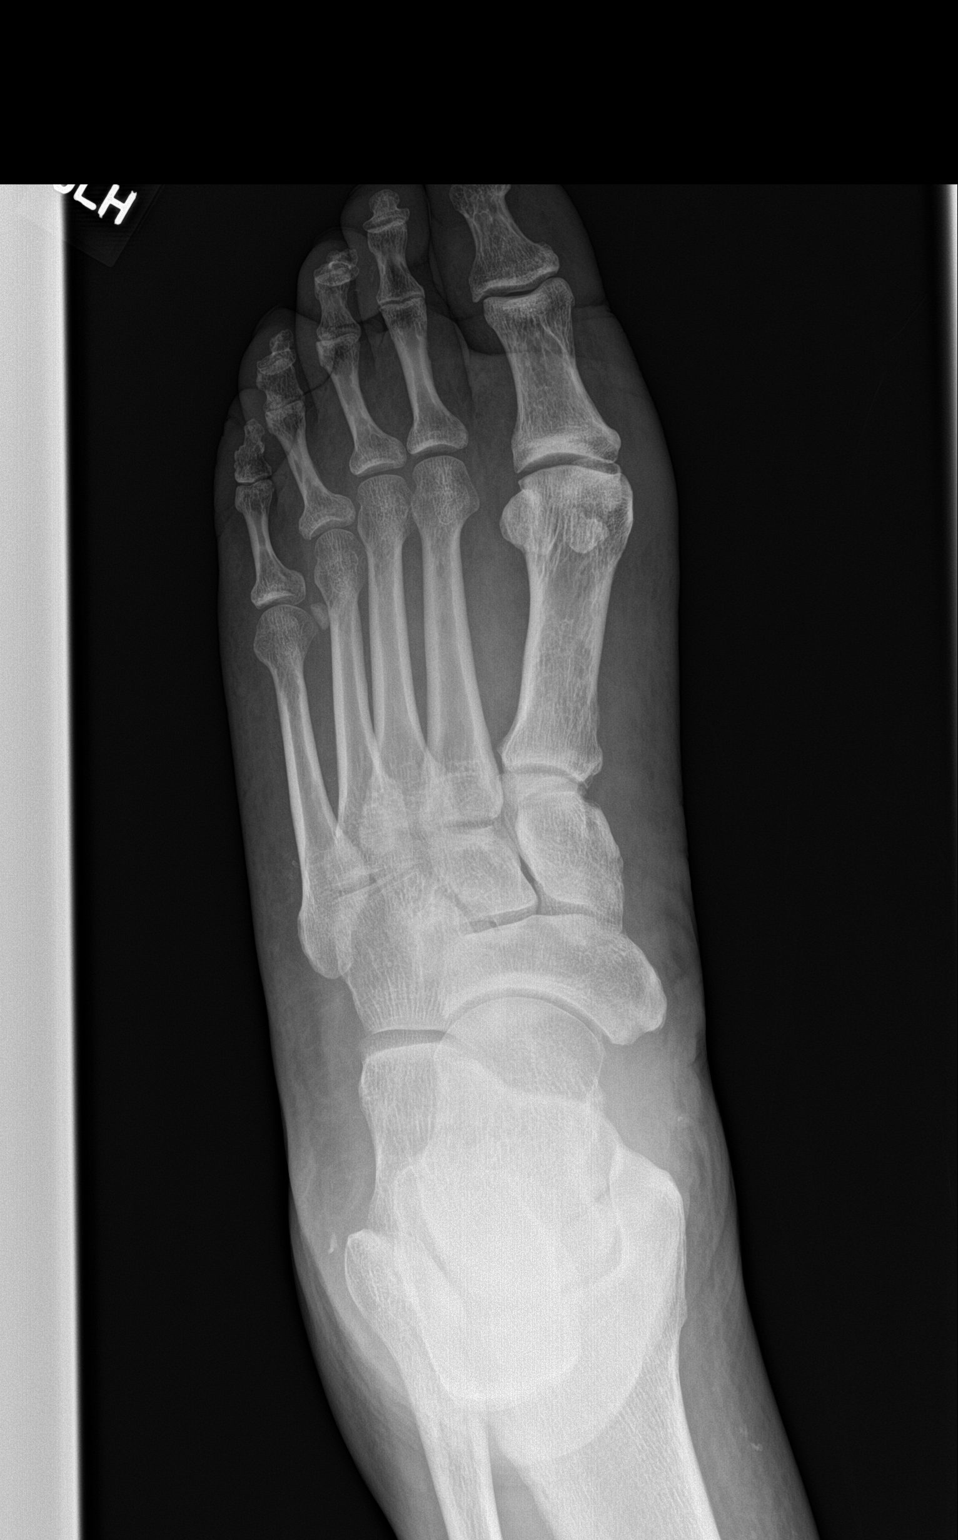

[3 of 3 positions shown; findings below may reference images not displayed]

FINDINGS: There is no evidence of fracture or dislocation. Very mild
degenerative changes are seen involving the metatarsophalangeal
joint of the left great toe. Soft tissues are unremarkable.
IMPRESSION: Very mild degenerative changes without evidence of acute osseous
abnormality.

## 2019-09-15 MED ORDER — COLCHICINE 0.6 MG PO TABS
1.2000 mg | ORAL_TABLET | Freq: Once | ORAL | Status: AC
  Administered 2019-09-15: 21:00:00 1.2 mg via ORAL

## 2019-09-15 MED ORDER — COLCHICINE 0.6 MG PO TABS: 0.6000 mg | ORAL_TABLET | Freq: Every day | ORAL | Status: DC

## 2019-09-15 MED ORDER — COLCHICINE 0.6 MG PO TABS
0.6000 mg | ORAL_TABLET | Freq: Two times a day (BID) | ORAL | Status: DC
  Administered 2019-09-15 – 2019-09-27 (×22): 0.6 mg via ORAL
  Filled 2019-09-15 (×24): qty 1

## 2019-09-15 NOTE — Progress Notes (Addendum)
Pharmacy Antibiotic Note  [name] is a 54 y.o. 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   Pharmacy has been consulted for gentamicin dosing for synergy. SCr today is 1.27 (Baseline around 1-1.10).   Erroneous gent level 5.5 tonight Gent dose given early today  4hr prior to level being drawn  Will adjust medication admin time and recheck gentamicin trough level later this week  Plan: Gentamicin 300 mg (~ 3 mg/kg AdjBW) every 24 hours Continue ceftriaxone 2 gm every 24 hours Monitor strep susceptibilities, management plan, renal function  Monitor gentamicin trough at steady state (Goal <1)   Height: 6\' 2"  (188 cm) Weight: 129.5 kg (285 lb 9.6 oz) IBW/kg (Calculated) : 82.2  Temp (24hrs), Avg:98.9 F (37.2 C), Min:98.8 F (37.1 C), Max:99.1 F (37.3 C)  Recent Labs  Lab 09/10/19 0448 09/10/19 0448 09/11/19 0508 09/12/19 0535 09/13/19 0424 09/14/19 0909 09/15/19 0530 09/15/19 1927  WBC 9.4  --   --  10.7* 7.7 9.2 8.6  --   CREATININE 1.50*   < > 1.27* 1.17 1.01 1.21 1.21  --   GENTTROUGH  --   --   --   --   --   --   --  5.5*   < > = values in this interval not displayed.    Estimated Creatinine Clearance: 99.8 mL/min (by C-G formula based on SCr of 1.21 mg/dL).    Allergies  Allergen Reactions  . Diltiazem Hives  . Insulin Glargine Swelling    edema    Antimicrobials this admission: 4/19 Vanc>> 4/21 4/19 ceftriaxone >> 4/21 gentamicin>>   Microbiology results: 4/19 BCx: GPCs in clusters 2/2>>STREPTOCOCCUS INFANTARIUS  4/20: bld x2 - ngtd  Bonnita Nasuti Pharm.D. CPP, BCPS Clinical Pharmacist [phone] 09/15/2019 8:57 PM

## 2019-09-15 NOTE — Progress Notes (Signed)
Patient ID: [name], male   DOB: [birth year], 54 y.o.   MRN: [id number] Imp:  Spontaneously drained large perirectal abscess-packed today.  Ok to remove packing tomorrow and cover with pad for drainage.  CCS can see again if needed.  Kaylyn Lim, MD, FACS

## 2019-09-15 NOTE — Progress Notes (Signed)
Progress Note  Patient Name: [name] Date of Encounter: 09/15/2019  Primary Cardiologist: Kirk Ruths, MD   Subjective   Denies any cardiovascular complaints.  Lying fully flat in bed without respiratory difficulty.  Inpatient Medications    Scheduled Meds: . [START ON 09/16/2019] aspirin  81 mg Oral Pre-Cath  . aspirin EC  81 mg Oral Daily  . atorvastatin  40 mg Oral Q breakfast  . gabapentin  300 mg Oral Q breakfast  . insulin aspart  0-15 Units Subcutaneous TID WC  . insulin aspart  0-5 Units Subcutaneous QHS  . insulin aspart  6 Units Subcutaneous TID WC  . insulin detemir  6 Units Subcutaneous BID  . levothyroxine  50 mcg Oral Q0600  . metroNIDAZOLE  500 mg Oral Q8H  . polyethylene glycol  17 g Oral BID  . sodium chloride flush  3 mL Intravenous Once  . sodium chloride flush  3 mL Intravenous Q12H   Continuous Infusions: . sodium chloride    . [START ON 09/16/2019] sodium chloride     Followed by  . [START ON 09/16/2019] sodium chloride    . cefTRIAXone (ROCEPHIN)  IV 2 g (09/15/19 1029)  . gentamicin 300 mg (09/14/19 2038)  . heparin 1,950 Units/hr (09/15/19 0746)   PRN Meds: sodium chloride, acetaminophen **OR** acetaminophen, nitroGLYCERIN, ondansetron **OR** ondansetron (ZOFRAN) IV, sodium chloride flush   Vital Signs    Vitals:   09/14/19 0506 09/14/19 1645 09/14/19 1957 09/15/19 0312  BP: 128/78 136/82 124/76 136/75  Pulse: 88 85 89 89  Resp: '18  15 18  '$ Temp: 98.8 F (37.1 C) 98.5 F (36.9 C) 99.6 F (37.6 C) 99.1 F (37.3 C)  TempSrc: Oral Oral Oral Oral  SpO2: 98% 99% 100% 97%  Weight: 129 kg   129.5 kg  Height:        Intake/Output Summary (Last 24 hours) at 09/15/2019 1049 Last data filed at 09/15/2019 0636 Gross per 24 hour  Intake 571 ml  Output 650 ml  Net -79 ml   Last 3 Weights 09/15/2019 09/14/2019 09/13/2019  Weight (lbs) 285 lb 9.6 oz 284 lb 6.3 oz 284 lb 9.6 oz  Weight (kg) 129.547 kg 129 kg 129.094 kg      Telemetry     Sinus rhythm- Personally Reviewed  ECG    Sinus rhythm with first-degree AV block- Personally Reviewed  Physical Exam  Obese, looks comfortable GEN: No acute distress.   Neck: No JVD Cardiac: RRR, crisp mechanical valve clicks, 2/6 early peaking systolic ejection murmur in the aortic focus and at the left lower sternal border, rubs, or gallops.  Respiratory: Clear to auscultation bilaterally. GI: Soft, nontender, non-distended  MS: No edema; No deformity.  Dressing overlying gluteal area, without significant drainage Neuro:  Nonfocal  Psych: Normal affect   Labs    High Sensitivity Troponin:   Recent Labs  Lab 09/08/19 0810 09/08/19 1100  TROPONINIHS 34* 28*      Chemistry Recent Labs  Lab 09/13/19 0424 09/14/19 0339 09/14/19 0909 09/15/19 0530  NA 138  --  136 137  K 3.7  --  3.8 3.8  CL 107  --  105 106  CO2 23  --  22 23  GLUCOSE 160*  --  249* 142*  BUN 13  --  15 13  CREATININE 1.01  --  1.21 1.21  CALCIUM 8.4*  --  8.7* 8.4*  PROT 6.5 6.8  --  6.6  ALBUMIN 2.3* 2.3*  --  2.4*  AST 19 21  --  21  ALT 12 12  --  13  ALKPHOS 64 70  --  67  BILITOT 0.9 0.8  --  1.1  GFRNONAA >60  --  >60 >60  GFRAA >60  --  >60 >60  ANIONGAP 8  --  9 8     Hematology Recent Labs  Lab 09/13/19 0424 09/14/19 0909 09/15/19 0530  WBC 7.7 9.2 8.6  RBC 3.14* 3.40* 3.05*  HGB 8.9* 9.8* 8.9*  HCT 25.4* 27.6* 25.0*  MCV 80.9 81.2 82.0  MCH 28.3 28.8 29.2  MCHC 35.0 35.5 35.6  RDW 14.2 14.4 14.4  PLT 200 230 212    BNP Recent Labs  Lab 09/08/19 1100  BNP 535.2*     DDimer No results for input(s): DDIMER in the last 168 hours.   Radiology    [place] EKG SITE RITE  Result Date: 09/14/2019 If Site Rite image not attached, placement could not be confirmed due to current cardiac rhythm.   Cardiac Studies   TEE 09/11/2019  1. Left ventricular ejection fraction, by estimation, is 55 to 60%. The  left ventricle has normal function. The left ventricle has no  regional  wall motion abnormalities. There is mild concentric left ventricular  hypertrophy.  2. Right ventricular systolic function is normal. The right ventricular  size is mildly enlarged. There is mildly elevated pulmonary artery  systolic pressure. The estimated right ventricular systolic pressure is  67.6 mmHg.  3. It appears that the left atrial appendage has been surgically resected  or oversown. Left atrial size was severely dilated. No left atrial/left  atrial appendage thrombus was detected. The LAA emptying velocity was 35  cm/s.  4. Right atrial size was severely dilated.  5. The mitral valve is normal in structure. Mild mitral valve  regurgitation.  6. There is a large, moderately mobile vegetation attached to the septal  leaflet of the tricuspid valve, measuring 27 mm in length and 11 mm in  width. It appears heterogeneous and friable.. Tricuspid valve  regurgitation is moderate.  7. There is a large vegetation attached to the medial aspect of posterior  disc or the posteromedial mechanical valve annulus. It is highly mobile  and prolapses readily across the valve orifice. It measures 15 mm in  length and 6 mm in width. Although  there is no evidence of a periannular abscess cavity, the periannular  tissue echodensity is heterogeneous, raining concern for  inflammation/early abscess formation. The proximity of the aortic and the  tricuspid vegetation to the same area of the  interventricular septum also raises concern for intramyocardial abscess or  fistula formation. The aortic valve has been repaired/replaced. Aortic  valve regurgitation is trivial. Mild aortic valve stenosis. There is a  unknown Medtronic tilting disk valve  present in the aortic position. Procedure Date: 2000. Aortic valve mean  gradient measures 20.0 mmHg.  8. There is mild (Grade II) atheroma plaque involving the transverse  aorta.   Patient Profile     54 y.o. 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  Numerous dental problems, likely source of endocarditis.  Gluteal area furuncle, spontaneously drained 4/23, currently packed.  Assessment & Plan    1.  Endocarditis:  Will require redo aortic valve replacement, probably with cadaveric homograft, tentatively scheduled for April 29.  On antibiotics IV.  Afebrile, WBC normal, follow-up blood cultures from 04 20/2021 are negative to date.  ESR 124 (09/10/2019), will repeat in AM.  Warfarin has been discontinued and he is on IV heparin, INR 2.0 today. Prior to surgery needs: -Cardiac cath, scheduled for 4/26, 1030 h, Dr. Martinique -Dental extractions, tentatively scheduled for 4/27 in AM -Scheduled for biopsy of T10 vertebral lesion, tentatively planned for 4/27 or 4/28   -The furuncle in his gluteal area has drained; Dr. Lissa Hoard recommends conservative management, he will not needed repacked, just a dressing to cover the area. 2. CAD: He will have redo bypass to the RCA.  For cardiac catheterization on Monday 4/26, to see if any other coronary revascularization needs. 3. PAFib: Currently in sinus rhythm, on IV heparin. 4. T10 lytic lesion: He has had 4 months of pain in his mid right back, preceding diagnosis of endocarditis.  Consider vertebral osteomyelitis versus plasmacytoma versus metastatic lesion.  Scheduled for biopsy. 5.  DM: Increased risk of infection/healing problems. Until recently glycemic control was excellent with hemoglobin A1c in the 6-7% range, on current admission up to 8.2%, possibly due to active infection. 6.  First-degree AV block: New compared to 2019.  Concerning for possible perivalvular abscess  formation.  By telemetry PR interval has been stable at about 210-220 ms.  We will recheck a twelve-lead ECG in AM.     For questions or updates, please contact [facility] Please consult [email or web address] for contact info under        Signed, Sanda Klein, MD  09/15/2019, 10:49 AM

## 2019-09-15 NOTE — H&P (View-Only) (Signed)
Progress Note  Patient Name: [name] Date of Encounter: 09/15/2019  Primary Cardiologist: Kirk Ruths, MD   Subjective   Denies any cardiovascular complaints.  Lying fully flat in bed without respiratory difficulty.  Inpatient Medications    Scheduled Meds: . [START ON 09/16/2019] aspirin  81 mg Oral Pre-Cath  . aspirin EC  81 mg Oral Daily  . atorvastatin  40 mg Oral Q breakfast  . gabapentin  300 mg Oral Q breakfast  . insulin aspart  0-15 Units Subcutaneous TID WC  . insulin aspart  0-5 Units Subcutaneous QHS  . insulin aspart  6 Units Subcutaneous TID WC  . insulin detemir  6 Units Subcutaneous BID  . levothyroxine  50 mcg Oral Q0600  . metroNIDAZOLE  500 mg Oral Q8H  . polyethylene glycol  17 g Oral BID  . sodium chloride flush  3 mL Intravenous Once  . sodium chloride flush  3 mL Intravenous Q12H   Continuous Infusions: . sodium chloride    . [START ON 09/16/2019] sodium chloride     Followed by  . [START ON 09/16/2019] sodium chloride    . cefTRIAXone (ROCEPHIN)  IV 2 g (09/15/19 1029)  . gentamicin 300 mg (09/14/19 2038)  . heparin 1,950 Units/hr (09/15/19 0746)   PRN Meds: sodium chloride, acetaminophen **OR** acetaminophen, nitroGLYCERIN, ondansetron **OR** ondansetron (ZOFRAN) IV, sodium chloride flush   Vital Signs    Vitals:   09/14/19 0506 09/14/19 1645 09/14/19 1957 09/15/19 0312  BP: 128/78 136/82 124/76 136/75  Pulse: 88 85 89 89  Resp: '18  15 18  '$ Temp: 98.8 F (37.1 C) 98.5 F (36.9 C) 99.6 F (37.6 C) 99.1 F (37.3 C)  TempSrc: Oral Oral Oral Oral  SpO2: 98% 99% 100% 97%  Weight: 129 kg   129.5 kg  Height:        Intake/Output Summary (Last 24 hours) at 09/15/2019 1049 Last data filed at 09/15/2019 0636 Gross per 24 hour  Intake 571 ml  Output 650 ml  Net -79 ml   Last 3 Weights 09/15/2019 09/14/2019 09/13/2019  Weight (lbs) 285 lb 9.6 oz 284 lb 6.3 oz 284 lb 9.6 oz  Weight (kg) 129.547 kg 129 kg 129.094 kg      Telemetry     Sinus rhythm- Personally Reviewed  ECG    Sinus rhythm with first-degree AV block- Personally Reviewed  Physical Exam  Obese, looks comfortable GEN: No acute distress.   Neck: No JVD Cardiac: RRR, crisp mechanical valve clicks, 2/6 early peaking systolic ejection murmur in the aortic focus and at the left lower sternal border, rubs, or gallops.  Respiratory: Clear to auscultation bilaterally. GI: Soft, nontender, non-distended  MS: No edema; No deformity.  Dressing overlying gluteal area, without significant drainage Neuro:  Nonfocal  Psych: Normal affect   Labs    High Sensitivity Troponin:   Recent Labs  Lab 09/08/19 0810 09/08/19 1100  TROPONINIHS 34* 28*      Chemistry Recent Labs  Lab 09/13/19 0424 09/14/19 0339 09/14/19 0909 09/15/19 0530  NA 138  --  136 137  K 3.7  --  3.8 3.8  CL 107  --  105 106  CO2 23  --  22 23  GLUCOSE 160*  --  249* 142*  BUN 13  --  15 13  CREATININE 1.01  --  1.21 1.21  CALCIUM 8.4*  --  8.7* 8.4*  PROT 6.5 6.8  --  6.6  ALBUMIN 2.3* 2.3*  --  2.4*  AST 19 21  --  21  ALT 12 12  --  13  ALKPHOS 64 70  --  67  BILITOT 0.9 0.8  --  1.1  GFRNONAA >60  --  >60 >60  GFRAA >60  --  >60 >60  ANIONGAP 8  --  9 8     Hematology Recent Labs  Lab 09/13/19 0424 09/14/19 0909 09/15/19 0530  WBC 7.7 9.2 8.6  RBC 3.14* 3.40* 3.05*  HGB 8.9* 9.8* 8.9*  HCT 25.4* 27.6* 25.0*  MCV 80.9 81.2 82.0  MCH 28.3 28.8 29.2  MCHC 35.0 35.5 35.6  RDW 14.2 14.4 14.4  PLT 200 230 212    BNP Recent Labs  Lab 09/08/19 1100  BNP 535.2*     DDimer No results for input(s): DDIMER in the last 168 hours.   Radiology    [place] EKG SITE RITE  Result Date: 09/14/2019 If Site Rite image not attached, placement could not be confirmed due to current cardiac rhythm.   Cardiac Studies   TEE 09/11/2019  1. Left ventricular ejection fraction, by estimation, is 55 to 60%. The  left ventricle has normal function. The left ventricle has no  regional  wall motion abnormalities. There is mild concentric left ventricular  hypertrophy.  2. Right ventricular systolic function is normal. The right ventricular  size is mildly enlarged. There is mildly elevated pulmonary artery  systolic pressure. The estimated right ventricular systolic pressure is  03.5 mmHg.  3. It appears that the left atrial appendage has been surgically resected  or oversown. Left atrial size was severely dilated. No left atrial/left  atrial appendage thrombus was detected. The LAA emptying velocity was 35  cm/s.  4. Right atrial size was severely dilated.  5. The mitral valve is normal in structure. Mild mitral valve  regurgitation.  6. There is a large, moderately mobile vegetation attached to the septal  leaflet of the tricuspid valve, measuring 27 mm in length and 11 mm in  width. It appears heterogeneous and friable.. Tricuspid valve  regurgitation is moderate.  7. There is a large vegetation attached to the medial aspect of posterior  disc or the posteromedial mechanical valve annulus. It is highly mobile  and prolapses readily across the valve orifice. It measures 15 mm in  length and 6 mm in width. Although  there is no evidence of a periannular abscess cavity, the periannular  tissue echodensity is heterogeneous, raining concern for  inflammation/early abscess formation. The proximity of the aortic and the  tricuspid vegetation to the same area of the  interventricular septum also raises concern for intramyocardial abscess or  fistula formation. The aortic valve has been repaired/replaced. Aortic  valve regurgitation is trivial. Mild aortic valve stenosis. There is a  unknown Medtronic tilting disk valve  present in the aortic position. Procedure Date: 2000. Aortic valve mean  gradient measures 20.0 mmHg.  8. There is mild (Grade II) atheroma plaque involving the transverse  aorta.   Patient Profile     54 y.o. 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  Numerous dental problems, likely source of endocarditis.  Gluteal area furuncle, spontaneously drained 4/23, currently packed.  Assessment & Plan    1.  Endocarditis:  Will require redo aortic valve replacement, probably with cadaveric homograft, tentatively scheduled for April 29.  On antibiotics IV.  Afebrile, WBC normal, follow-up blood cultures from 04 20/2021 are negative to date.  ESR 124 (09/10/2019), will repeat in AM.  Warfarin has been discontinued and he is on IV heparin, INR 2.0 today. Prior to surgery needs: -Cardiac cath, scheduled for 4/26, 1030 h, Dr. Martinique -Dental extractions, tentatively scheduled for 4/27 in AM -Scheduled for biopsy of T10 vertebral lesion, tentatively planned for 4/27 or 4/28   -The furuncle in his gluteal area has drained; Dr. Lissa Hoard recommends conservative management, he will not needed repacked, just a dressing to cover the area. 2. CAD: He will have redo bypass to the RCA.  For cardiac catheterization on Monday 4/26, to see if any other coronary revascularization needs. 3. PAFib: Currently in sinus rhythm, on IV heparin. 4. T10 lytic lesion: He has had 4 months of pain in his mid right back, preceding diagnosis of endocarditis.  Consider vertebral osteomyelitis versus plasmacytoma versus metastatic lesion.  Scheduled for biopsy. 5.  DM: Increased risk of infection/healing problems. Until recently glycemic control was excellent with hemoglobin A1c in the 6-7% range, on current admission up to 8.2%, possibly due to active infection. 6.  First-degree AV block: New compared to 2019.  Concerning for possible perivalvular abscess  formation.  By telemetry PR interval has been stable at about 210-220 ms.  We will recheck a twelve-lead ECG in AM.     For questions or updates, please contact [facility] Please consult [email or web address] for contact info under        Signed, Sanda Klein, MD  09/15/2019, 10:49 AM

## 2019-09-15 NOTE — Progress Notes (Signed)
PROGRESS NOTE    [name]  [id number] DOB: 30-Aug-1965 DOA: 09/08/2019 PCP: Biagio Borg, MD    Chief Complaint  Patient presents with  . Shortness of Breath  . Emesis   Brief Narrative:  54 y.o.malewith history of CAD s/p CABG in 2000, SVG-RCA PCI with BMS in Feb 2015, he had re-do PCI in June 2019 DES to SVG graft, ascending aortic dissection in 2000 s/p repair with AVR on warfarin.  Paroxysmal Kamelia Lampkins. fib.  NIDDM-2, HTN and HLD presenting with 5 days of chills, nausea, vomiting, diarrhea and dyspnea on exertion and orthopnea mainly with lying on the left side.  In ED, hemodynamically stable and saturating well on room air.  Mild temp to 100.1 F. Cr 1.31 (baseline 1 to 1.1).  BUN 17.  Hgb 10.4 (baseline 11-12).  BNP 535. HS trop 34> 28.  CXR without acute finding.  INR 2.3.  COVID-19 negative.  Cardiology consulted by EDP.  Admitted for nausea, vomiting, diarrhea and DOE.  Echocardiogram ordered by cardiology.  The next day, echo with EF of 55 to 60%, severe RAE, moderate LAE, 1.8 x 1.1 cm tricuspid valve mass on the atrial surface consistent with vegetation and moderate TVR.  Blood cultures with GPC in clusters in 1/2.  BCID with Streptococcus species.  ID consulted and started on ceftriaxone and vancomycin for infective endocarditis/bacteremia.  Repeat blood culture obtained on 4/20.   Assessment & Plan:   Principal Problem:   Suspected endocarditis mechanical aortic valve Active Problems:   Diabetes mellitus type II, non insulin dependent (HCC)   Hyperlipidemia   Essential hypertension   PAF (paroxysmal atrial fibrillation) (HCC)   Hypothyroidism   Normocytic anemia   Shortness of breath   Obesity (BMI 30-39.9)   CAD (coronary artery disease)   Nausea vomiting and diarrhea   Gram-positive cocci bacteremia   Endocarditis of tricuspid valve  Infective endocarditis  Strep Infantarius Bacteremia: Patient with fever and chills.  Echocardiogram with tricuspid valve  vegetation and regurgitation.   - blood cx 4/19 strep infantarius intermediate to penicillin - blood cx 4/20 cx NGTD - ID consulted, appreciate recommendations - continue ceftriaxone/gentamicin - consider colonoscopy prior to surgery for ? Polyps per ID - TEE with large vegetations on septal leaflet of the tricuspid valve and on the posterior disc of the mechanical aortic valve (see report) - Repeat blood culture 4/20 NGTD - ok for PICC - orthopantogram with dental caries and periodontal disease 4/21 - CT surgery c/s, planning for surgery in middle to end of next week (depending on prior to surgery eval/treatment) - planning for dental procedure on 4/27 at 7:30 AM  - cardiac catheterization planed for Monday, 4/26 - IR planning for bx of T10 vertebral lesion on 4/27 or 4/28 - CT angio chest abdomen pelvis   Left Buttocks Abscess: spontaneously drained.  Appreciate surgeries assistance, planning to pack wound.  Added flagyl.  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   Planning for bone bx by IR per neurosurgery  Nausea/vomiting/diarrhea: Could be due to the above.  GIP negative.  GI symptoms resolved. -Continue symptomatic care -Discontinue enteric precautions  Iron Def Anemia: hb relatively stable, continue to follow Follow B12, folate Start iron supplementation  Dyspnea on exertion/orthopnea:echo with EF of 55 to 60%, severe RAE, moderate LAE, 1.8 x 1.1 cm tricuspid valve mass on  the atrial surface consistent with vegetation and moderate TVR.  No signs of fluid overload.  BNP slightly elevated but no vascular congestion on CXR.  Has history of asthma but not wheezing or coughing.  INR therapeutic to think of PE.  Overall, respiratory symptoms improved. -Hold home Lasix in the setting of AKI.  CAD s/p CABG in 2000,  SVG-RCA PCI with BMS in Feb 2015, he had re-do PCI in June 2019 DES to SVG graft: Echocardiogram as above. -Continue home ASA and statin.   -Plavix on hold in the setting of #1.  Last dose on 4/19 -Metoprolol, losartan and Lasix on hold due to AKI and soft blood pressures  Elevated troponin: Likely demand ischemia in the setting of the above.  No exertional chest pain. -Cardiac meds as above  Ascending aortic dissection in 2000 s/p repair with Mechanical AVR on warfarin.  INR therapeutic. - heparin gtt for INR <2.5, started 4/24 -Monitor fluid status and renal function.  Paroxysmal Marshea Wisher. fib.  Rate controlled on metoprolol. -Metoprolol on hold per cardiology. -Anticoagulation as above.  Uncontrolled NIDDM-2 with hyperglycemia: A1c 8.2%. -Continue SSI-moderate -Start Levemir 6 units twice daily and NovoLog 6 units AC -Continue statin.  Check lipid panel  Acute kidney injury: Likely due to hypotension and nephrotoxic meds (vancomycin, Lasix and losartan) -Holding metoprolol, Lasix and losartan -Continue monitoring  Essential HTN:  -metoprolol, amlodipine and losartan on hold.  BP's reasonable  HLD  -Check lipid panel -Continue atorvastatin.  Hypothyroidism: -Continue home Synthroid.  At risk for sleep apnea: STOP BANG score 6-7 which puts him at high risk for sleep apnea. -Needs outpatient sleep study.  Morbid obesity: Body mass index is 36.03 kg/m.  With comorbidities as above. -Encourage lifestyle change to lose weight.  DVT prophylaxis: heparin to start when INR decreases < 2.5 Code Status: full  Family Communication: wife at bedside Disposition:   Status is: Inpatient  Remains inpatient appropriate because:Inpatient level of care appropriate due to severity of illness   Dispo: The patient is from: Home              Anticipated d/c is to: pending              Anticipated d/c date is: > 3 days              Patient currently is not medically stable to  d/c.   Consultants:   ID  Cardiology  CT surgery  Procedures:  Transesophageal Echo IMPRESSIONS    1. Left ventricular ejection fraction, by estimation, is 55 to 60%. The  left ventricle has normal function. The left ventricle has no regional  wall motion abnormalities. There is mild concentric left ventricular  hypertrophy.  2. Right ventricular systolic function is normal. The right ventricular  size is mildly enlarged. There is mildly elevated pulmonary artery  systolic pressure. The estimated right ventricular systolic pressure is  [id number] mmHg.  3. It appears that the left atrial appendage has been surgically resected  or oversown. Left atrial size was severely dilated. No left atrial/left  atrial appendage thrombus was detected. The LAA emptying velocity was 35  cm/s.  4. Right atrial size was severely dilated.  5. The mitral valve is normal in structure. Mild mitral valve  regurgitation.  6. There is Oluwanifemi Susman large, moderately mobile vegetation attached to the septal  leaflet of the tricuspid valve, measuring 27 mm in length and 11 mm in  width. It appears heterogeneous and friable.. Tricuspid valve  regurgitation  is moderate.  7. There is Lars Jeziorski large vegetation attached to the medial aspect of posterior  disc or the posteromedial mechanical valve annulus. It is highly mobile  and prolapses readily across the valve orifice. It measures 15 mm in  length and 6 mm in width. Although  there is no evidence of Andilyn Bettcher periannular abscess cavity, the periannular  tissue echodensity is heterogeneous, raining concern for  inflammation/early abscess formation. The proximity of the aortic and the  tricuspid vegetation to the same area of the  interventricular septum also raises concern for intramyocardial abscess or  fistula formation. The aortic valve has been repaired/replaced. Aortic  valve regurgitation is trivial. Mild aortic valve stenosis. There is Faris Coolman  unknown [employer] tilting disk  valve  present in the aortic position. Procedure Date: 2000. Aortic valve mean  gradient measures 20.0 mmHg.  8. There is mild (Grade II) atheroma plaque involving the transverse  aorta.   Comparison(s): Compared to previous reports, there is evidence of  vegetations on the tricuspid valve and the mechanical aortic valve and  suspicion for early perivalvular abscess. The dimensionless aortic vale  index is similar to older studies. This  suggests that the increased aortic valve gradients are due to increased  cardiac output (rather than obstruction from the vegetation or compromised  prosthetic disc motion).   Echo IMPRESSIONS    1. Tricuspid valve vegetation. Discussed with referring provider.  2. Left ventricular ejection fraction, by estimation, is 55 to 60%. The  left ventricle has normal function. The left ventricle has no regional  wall motion abnormalities. Left ventricular diastolic parameters were  normal.  3. Right ventricular systolic function is normal. The right ventricular  size is mildly enlarged. There is normal pulmonary artery systolic  pressure.  4. Left atrial size was moderately dilated.  5. Right atrial size was severely dilated.  6. The mitral valve is normal in structure. Mild mitral valve  regurgitation. No evidence of mitral stenosis.  7. There is Louis Gaw 1.8 x 1.1 cm tricuspid valve mass on the atrial surface  consistent with vegetation. . Tricuspid valve regurgitation is moderate.  8. Transaortic velocity is higher than expected for mechanical aortic  valve. The aortic valve is normal in structure. Aortic valve regurgitation  is not visualized. No aortic stenosis is present. There is Weltha Cathy mechanical  valve present in the aortic  position. Procedure Date: 2000. Aortic valve mean gradient measures 33.5  mmHg. Aortic valve Vmax measures 3.68 m/s.  9. Aortic root/ascending aorta has been repaired/replaced and [place].  10. The inferior vena cava is  normal in size with greater than 50%  respiratory variability, suggesting right atrial pressure of 3 mmHg.  Antimicrobials:  Anti-infectives (From admission, onward)   Start     Dose/Rate Route Frequency Ordered Stop   09/14/19 1430  metroNIDAZOLE (FLAGYL) tablet 500 mg     500 mg Oral Every 8 hours 09/14/19 1348     09/11/19 1700  gentamicin (GARAMYCIN) 300 mg in dextrose 5 % 50 mL IVPB     300 mg 115 mL/hr over 30 Minutes Intravenous Every 24 hours 09/11/19 1540     09/10/19 2300  vancomycin (VANCOREADY) IVPB 1750 mg/350 mL  Status:  Discontinued     1,750 mg 175 mL/hr over 120 Minutes Intravenous Every 24 hours 09/10/19 0721 09/11/19 1511   09/10/19 0600  vancomycin (VANCOREADY) IVPB 1250 mg/250 mL  Status:  Discontinued     1,250 mg 166.7 mL/hr over 90 Minutes Intravenous Every 12  hours 09/09/19 1345 09/10/19 0721   09/09/19 1430  cefTRIAXone (ROCEPHIN) 2 g in sodium chloride 0.9 % 100 mL IVPB     2 g 200 mL/hr over 30 Minutes Intravenous Every 24 hours 09/09/19 1335     09/09/19 1430  vancomycin (VANCOCIN) 2,500 mg in sodium chloride 0.9 % 500 mL IVPB     2,500 mg 250 mL/hr over 120 Minutes Intravenous  Once 09/09/19 1345 09/09/19 1847      Subjective: No complaints today  Objective: Vitals:   09/14/19 0506 09/14/19 1645 09/14/19 1957 09/15/19 0312  BP: 128/78 136/82 124/76 136/75  Pulse: 88 85 89 89  Resp: 18  15 18   Temp: 98.8 F (37.1 C) 98.5 F (36.9 C) 99.6 F (37.6 C) 99.1 F (37.3 C)  TempSrc: Oral Oral Oral Oral  SpO2: 98% 99% 100% 97%  Weight: 129 kg   129.5 kg  Height:        Intake/Output Summary (Last 24 hours) at 09/15/2019 1225 Last data filed at 09/15/2019 0636 Gross per 24 hour  Intake 571 ml  Output 650 ml  Net -79 ml   Filed Weights   09/13/19 0500 09/14/19 0506 09/15/19 0312  Weight: 129.1 kg 129 kg 129.5 kg    Examination:  General: No acute distress. Cardiovascular: Heart sounds show Orly Quimby regular rate, and rhythm. Click. Lungs: Clear  to auscultation bilaterally Abdomen: Soft, nontender, nondistended  Neurological: Alert and oriented 3. Moves all extremities 4. Cranial nerves II through XII grossly intact. Skin: Warm and dry. No rashes or lesions. Extremities: No clubbing or cyanosis. No edema.   Data Reviewed: I have personally reviewed following labs and imaging studies  CBC: Recent Labs  Lab 09/10/19 0448 09/12/19 0535 09/13/19 0424 09/14/19 0909 09/15/19 0530  WBC 9.4 10.7* 7.7 9.2 8.6  NEUTROABS  --  8.1* 5.7 7.0 6.2  HGB 10.5* 10.6* 8.9* 9.8* 8.9*  HCT 29.7* 29.7* 25.4* 27.6* 25.0*  MCV 80.7 80.5 80.9 81.2 82.0  PLT 213 252 200 230 [id number]    Basic Metabolic Panel: Recent Labs  Lab 09/10/19 0448 09/10/19 0448 09/11/19 0508 09/12/19 0535 09/13/19 0424 09/14/19 0339 09/14/19 0909 09/15/19 0530  NA 137   < > 137 137 138  --  136 137  K 3.5   < > 3.3* 3.7 3.7  --  3.8 3.8  CL 104   < > 108 107 107  --  105 106  CO2 23   < > 22 22 23   --  22 23  GLUCOSE 181*   < > 203* 199* 160*  --  249* 142*  BUN 16   < > 15 13 13   --  15 13  CREATININE 1.50*   < > 1.27* 1.17 1.01  --  1.21 1.21  CALCIUM 8.6*   < > 8.4* 8.7* 8.4*  --  8.7* 8.4*  MG 1.5*  --   --  1.7 1.7 1.7  --  1.7  PHOS 3.2  --   --  2.7 3.3 3.7  --  3.7   < > = values in this interval not displayed.    GFR: Estimated Creatinine Clearance: 99.8 mL/min (by C-G formula based on SCr of 1.21 mg/dL).  Liver Function Tests: Recent Labs  Lab 09/09/19 0406 09/10/19 0448 09/13/19 0424 09/14/19 0339 09/15/19 0530  AST 23  --  19 21 21   ALT 15  --  12 12 13   ALKPHOS 80  --  64 70 67  BILITOT 1.1  --  0.9 0.8 1.1  PROT 7.2  --  6.5 6.8 6.6  ALBUMIN 2.5* 2.6* 2.3* 2.3* 2.4*    CBG: Recent Labs  Lab 09/14/19 1139 09/14/19 1642 09/14/19 2101 09/15/19 0743 09/15/19 1144  GLUCAP 166* 107* 152* 153* 197*     Recent Results (from the past 240 hour(s))  SARS CORONAVIRUS 2 (TAT 6-24 HRS) Nasopharyngeal Nasopharyngeal Swab     Status:  None   Collection Time: 09/08/19  1:50 PM   Specimen: Nasopharyngeal Swab  Result Value Ref Range Status   SARS Coronavirus 2 NEGATIVE NEGATIVE Final    Comment: (NOTE) SARS-CoV-2 target nucleic acids are NOT DETECTED. The SARS-CoV-2 RNA is generally detectable in upper and lower respiratory specimens during the acute phase of infection. Negative results do not preclude SARS-CoV-2 infection, do not rule out co-infections with other pathogens, and should not be used as the sole basis for treatment or other patient management decisions. Negative results must be combined with clinical observations, patient history, and epidemiological information. The expected result is Negative. Fact Sheet for Patients: SugarRoll.be Fact Sheet for Healthcare Providers: https://www.woods-mathews.com/ This test is not yet approved or cleared by the [place] FDA and  has been authorized for detection and/or diagnosis of SARS-CoV-2 by FDA under an Emergency Use Authorization (EUA). This EUA will remain  in effect (meaning this test can be used) for the duration of the COVID-19 declaration under Section 56 4(b)(1) of the Act, 21 U.S.C. section 360bbb-3(b)(1), unless the authorization is terminated or revoked sooner. Performed at [facility], [place] [address], [place], [place] [address]   Gastrointestinal Panel by PCR , Stool     Status: None   Collection Time: 09/09/19 12:21 PM   Specimen: Stool  Result Value Ref Range Status   Campylobacter species NOT DETECTED NOT DETECTED Final   Plesimonas shigelloides NOT DETECTED NOT DETECTED Final   Salmonella species NOT DETECTED NOT DETECTED Final   Yersinia enterocolitica NOT DETECTED NOT DETECTED Final   Vibrio species NOT DETECTED NOT DETECTED Final   Vibrio cholerae NOT DETECTED NOT DETECTED Final   Enteroaggregative E coli (EAEC) NOT DETECTED NOT DETECTED Final   Enteropathogenic E coli (EPEC) NOT  DETECTED NOT DETECTED Final   Enterotoxigenic E coli (ETEC) NOT DETECTED NOT DETECTED Final   Shiga like toxin producing E coli (STEC) NOT DETECTED NOT DETECTED Final   Shigella/Enteroinvasive E coli (EIEC) NOT DETECTED NOT DETECTED Final   Cryptosporidium NOT DETECTED NOT DETECTED Final   Cyclospora cayetanensis NOT DETECTED NOT DETECTED Final   Entamoeba histolytica NOT DETECTED NOT DETECTED Final   Giardia lamblia NOT DETECTED NOT DETECTED Final   Adenovirus F40/41 NOT DETECTED NOT DETECTED Final   Astrovirus NOT DETECTED NOT DETECTED Final   Norovirus GI/GII NOT DETECTED NOT DETECTED Final   Rotavirus Kumar Falwell NOT DETECTED NOT DETECTED Final   Sapovirus (I, II, IV, and V) NOT DETECTED NOT DETECTED Final    Comment: Performed at [facility], [address] [place], [place] [address]  Culture, blood (Routine X 2) w Reflex to ID Panel     Status: Abnormal   Collection Time: 09/09/19  1:12 PM   Specimen: BLOOD  Result Value Ref Range Status   Specimen Description BLOOD RIGHT ANTECUBITAL  Final   Special Requests   Final    BOTTLES DRAWN AEROBIC AND ANAEROBIC Blood Culture adequate volume Performed at [facility], [address], [place], [place] [address]  Culture  Setup Time   Final    GRAM POSITIVE COCCI ANAEROBIC BOTTLE ONLY CRITICAL RESULT CALLED TO, READ BACK BY AND VERIFIED WITH: PHARMD SEAY LAURA AT [facility] ON 09/10/2019     Culture STREPTOCOCCUS INFANTARIUS (Joely Losier)  Final   Report Status 09/12/2019 FINAL  Final   Organism ID, Bacteria STREPTOCOCCUS INFANTARIUS  Final      Susceptibility   Streptococcus infantarius - MIC*    PENICILLIN 0.25 INTERMEDIATE Intermediate     CEFTRIAXONE 0.25 SENSITIVE Sensitive     ERYTHROMYCIN <=0.12 SENSITIVE Sensitive     LEVOFLOXACIN 2 SENSITIVE Sensitive     VANCOMYCIN <=0.12 SENSITIVE Sensitive     * STREPTOCOCCUS INFANTARIUS  Culture, blood (Routine X 2) w Reflex to ID Panel     Status: Abnormal   Collection  Time: 09/09/19  1:12 PM   Specimen: BLOOD LEFT HAND  Result Value Ref Range Status   Specimen Description BLOOD LEFT HAND  Final   Special Requests   Final    BOTTLES DRAWN AEROBIC AND ANAEROBIC Blood Culture adequate volume   Culture  Setup Time   Final    AEROBIC BOTTLE ONLY GRAM POSITIVE COCCI IN CHAINS GRAM POSITIVE COCCI IN CLUSTERS CRITICAL VALUE NOTED.  VALUE IS CONSISTENT WITH PREVIOUSLY REPORTED AND CALLED VALUE.    Culture (Hubbert Landrigan)  Final    STREPTOCOCCUS INFANTARIUS SUSCEPTIBILITIES PERFORMED ON PREVIOUS CULTURE WITHIN THE LAST 5 DAYS. Performed at [facility], [place] [address] [place], [place] [address]    Report Status 09/12/2019 FINAL  Final  Blood Culture ID Panel (Reflexed)     Status: Abnormal   Collection Time: 09/09/19  1:12 PM  Result Value Ref Range Status   Enterococcus species NOT DETECTED NOT DETECTED Final   Listeria monocytogenes NOT DETECTED NOT DETECTED Final   Staphylococcus species NOT DETECTED NOT DETECTED Final   Staphylococcus aureus (BCID) NOT DETECTED NOT DETECTED Final   Streptococcus species DETECTED (Terance Pomplun) NOT DETECTED Final    Comment: Not Enterococcus species, Streptococcus agalactiae, Streptococcus pyogenes, or Streptococcus pneumoniae. CRITICAL RESULT CALLED TO, READ BACK BY AND VERIFIED WITH: Criss Rosales PharmD 12:50 09/10/19 ([email or web address])    Streptococcus agalactiae NOT DETECTED NOT DETECTED Final   Streptococcus pneumoniae NOT DETECTED NOT DETECTED Final   Streptococcus pyogenes NOT DETECTED NOT DETECTED Final   Acinetobacter baumannii NOT DETECTED NOT DETECTED Final   Enterobacteriaceae species NOT DETECTED NOT DETECTED Final   Enterobacter cloacae complex NOT DETECTED NOT DETECTED Final   Escherichia coli NOT DETECTED NOT DETECTED Final   Klebsiella oxytoca NOT DETECTED NOT DETECTED Final   Klebsiella pneumoniae NOT DETECTED NOT DETECTED Final   Proteus species NOT DETECTED NOT DETECTED Final   Serratia marcescens NOT DETECTED NOT DETECTED  Final   Haemophilus influenzae NOT DETECTED NOT DETECTED Final   Neisseria meningitidis NOT DETECTED NOT DETECTED Final   Pseudomonas aeruginosa NOT DETECTED NOT DETECTED Final   Candida albicans NOT DETECTED NOT DETECTED Final   Candida glabrata NOT DETECTED NOT DETECTED Final   Candida krusei NOT DETECTED NOT DETECTED Final   Candida parapsilosis NOT DETECTED NOT DETECTED Final   Candida tropicalis NOT DETECTED NOT DETECTED Final    Comment: Performed at [facility], [place]. [address], [place], [place] [address]  Culture, blood (routine x 2)     Status: None (Preliminary result)   Collection Time: 09/10/19  2:46 PM   Specimen: BLOOD RIGHT FOREARM  Result Value Ref Range Status   Specimen  Description BLOOD RIGHT FOREARM  Final   Special Requests   Final    BOTTLES DRAWN AEROBIC AND ANAEROBIC Blood Culture adequate volume   Culture   Final    NO GROWTH 4 DAYS Performed at [facility], [address], [place], [place] [address]    Report Status PENDING  Incomplete  Culture, blood (routine x 2)     Status: None (Preliminary result)   Collection Time: 09/10/19  2:51 PM   Specimen: BLOOD RIGHT WRIST  Result Value Ref Range Status   Specimen Description BLOOD RIGHT WRIST  Final   Special Requests   Final    BOTTLES DRAWN AEROBIC ONLY Blood Culture adequate volume   Culture   Final    NO GROWTH 4 DAYS Performed at [facility], [place] [address] [place], [place] [address]    Report Status PENDING  Incomplete         Radiology Studies: [place] EKG SITE RITE  Result Date: 09/14/2019 If Site Rite image not attached, placement could not be confirmed due to current cardiac rhythm.       Scheduled Meds: . [START ON 09/16/2019] aspirin  81 mg Oral Pre-Cath  . aspirin EC  81 mg Oral Daily  . atorvastatin  40 mg Oral Q breakfast  . gabapentin  300 mg Oral Q breakfast  . insulin aspart  0-15 Units Subcutaneous TID WC  . insulin aspart  0-5 Units Subcutaneous QHS  .  insulin aspart  6 Units Subcutaneous TID WC  . insulin detemir  6 Units Subcutaneous BID  . levothyroxine  50 mcg Oral Q0600  . metroNIDAZOLE  500 mg Oral Q8H  . polyethylene glycol  17 g Oral BID  . sodium chloride flush  3 mL Intravenous Once  . sodium chloride flush  3 mL Intravenous Q12H   Continuous Infusions: . sodium chloride    . [START ON 09/16/2019] sodium chloride     Followed by  . [START ON 09/16/2019] sodium chloride    . cefTRIAXone (ROCEPHIN)  IV 2 g (09/15/19 1029)  . gentamicin 300 mg (09/14/19 2038)  . heparin 1,950 Units/hr (09/15/19 0746)     LOS: 7 days    Time spent: over 30 min    Fayrene Helper, MD [facility]   To contact the attending provider between 7A-7P or the covering provider during after hours 7P-7A, please log into the web site [email or web address] and access using universal [place] password for that web site. If you do not have the password, please call the hospital operator.  09/15/2019, 12:25 PM

## 2019-09-15 NOTE — Progress Notes (Signed)
[facility] for warfarin>>heparin Indication: atrial fibrillation + mechanical AVR  Allergies  Allergen Reactions  . Diltiazem Hives  . Insulin Glargine Swelling    edema    Patient Measurements: Height: 6\' 2"  (188 cm) Weight: 129.5 kg (285 lb 9.6 oz) IBW/kg (Calculated) : 82.2  Vital Signs: Temp: 99.1 F (37.3 C) (04/25 0312) Temp Source: Oral (04/25 0312) BP: 136/75 (04/25 0312) Pulse Rate: 89 (04/25 0312)  Labs: Recent Labs    09/13/19 0424 09/13/19 0424 09/13/19 1759 09/14/19 0339 09/14/19 0909 09/14/19 1812 09/15/19 0530 09/15/19 1414  HGB 8.9*   < >  --   --  9.8*  --  8.9*  --   HCT 25.4*  --   --   --  27.6*  --  25.0*  --   PLT 200  --   --   --  230  --  212  --   LABPROT 27.9*   < > 26.6* 25.0*  --   --  22.7*  --   INR 2.6*   < > 2.5* 2.3*  --   --  2.0*  --   HEPARINUNFRC  --   --   --   --   --  <0.10* 0.28* 0.35  CREATININE 1.01  --   --   --  1.21  --  1.21  --    < > = values in this interval not displayed.    Estimated Creatinine Clearance: 99.8 mL/min (by C-G formula based on SCr of 1.21 mg/dL).   Medical History: Past Medical History:  Diagnosis Date  . Ascending aortic dissection ([place])    a. 2000 - with aortic valve involvement. S/p repair of aortic dissection with placement of mechanical AVR.  [name] CAD (coronary artery disease)    a. 2000 VG->PDA @ time of Ao dissection repair;  b. Cardiac CT 06/2010: no CAD, only mild plaque in prox RCA;  c. 2015 Cath: LM nl, LAD nl, LCX nl, RCA 100, VG->PDA 90 (4.5x12 Rebel BMS). d. 10/23/17 instent restenosis SVG to RCA, DES placed.  . Cholelithiasis 08/22/2013  . Chronic diastolic CHF (congestive heart failure) ([place])    a. 03/2016 Echo: EF 55-60%, no rwma, Gr2 DD.  . Diabetes mellitus   . Dyspnea   . Gross hematuria   . H/O mechanical aortic valve replacement    a. 03/2016 Echo: AoV mean gradient 74mmHg, Valve Area (VTI) 1.36 cm^2, (Vmax) 1.22 cm^2, mod dil LA.  [name]  Hyperlipidemia   . Hypertension   . NEPHROLITHIASIS, HX OF   . Obesity   . PAF (paroxysmal atrial fibrillation) ([place])    a. H/o such, with recurrence of coarse afib/flutter during ER consult 03/2012.  . Stroke ([place])   . TRANSIENT ISCHEMIC ATTACK, HX OF    a. In 2004.  [name] Unspecified vitamin D deficiency    Assessment: 44 YOM presenting with SOB and N/V, on warfarin PTA for Afib and mechanical AVR. Vegetation seen on TTE, will need to hold warfarin and begin IV heparin with impending need for procedures.  PTA dosing: 5mg  MWF, 7.5mg  all other days  INR trending down to 2. Heparin level came back therapeutic this PM. Will confirm with AM level.   Goal of Therapy:  Heparin level 0.3-0.7 units/mL INR 2.5-3.5 Monitor platelets by anticoagulation protocol: Yes   Plan:  Cont heparin 1950 units/hr Daily heparin and CBC   Onnie Boer, PharmD, BCIDP, AAHIVP, CPP Infectious Disease Pharmacist 09/15/2019 3:06  PM       

## 2019-09-15 NOTE — Progress Notes (Signed)
[place] [facility] for warfarin>>heparin Indication: atrial fibrillation + mechanical AVR  Allergies  Allergen Reactions  . Diltiazem Hives  . Insulin Glargine Swelling    edema    Patient Measurements: Height: 6\' 2"  (188 cm) Weight: 129.5 kg (285 lb 9.6 oz) IBW/kg (Calculated) : 82.2  Vital Signs: Temp: 99.1 F (37.3 C) (04/25 0312) Temp Source: Oral (04/25 0312) BP: 136/75 (04/25 0312) Pulse Rate: 89 (04/25 0312)  Labs: Recent Labs    09/13/19 0424 09/13/19 0424 09/13/19 1759 09/14/19 0339 09/14/19 0909 09/14/19 1812 09/15/19 0530  HGB 8.9*   < >  --   --  9.8*  --  8.9*  HCT 25.4*  --   --   --  27.6*  --  25.0*  PLT 200  --   --   --  230  --  212  LABPROT 27.9*  --  26.6* 25.0*  --   --   --   INR 2.6*  --  2.5* 2.3*  --   --   --   HEPARINUNFRC  --   --   --   --   --  <0.10* 0.28*  CREATININE 1.01  --   --   --  1.21  --   --    < > = values in this interval not displayed.    Estimated Creatinine Clearance: 99.8 mL/min (by C-G formula based on SCr of 1.21 mg/dL).   Medical History: Past Medical History:  Diagnosis Date  . Ascending aortic dissection ([place])    a. 2000 - with aortic valve involvement. S/p repair of aortic dissection with placement of mechanical AVR.  [name] CAD (coronary artery disease)    a. 2000 VG->PDA @ time of Ao dissection repair;  b. Cardiac CT 06/2010: no CAD, only mild plaque in prox RCA;  c. 2015 Cath: LM nl, LAD nl, LCX nl, RCA 100, VG->PDA 90 (4.5x12 Rebel BMS). d. 10/23/17 instent restenosis SVG to RCA, DES placed.  . Cholelithiasis 08/22/2013  . Chronic diastolic CHF (congestive heart failure) ([place])    a. 03/2016 Echo: EF 55-60%, no rwma, Gr2 DD.  . Diabetes mellitus   . Dyspnea   . Gross hematuria   . H/O mechanical aortic valve replacement    a. 03/2016 Echo: AoV mean gradient 91mmHg, Valve Area (VTI) 1.36 cm^2, (Vmax) 1.22 cm^2, mod dil LA.  [name] Hyperlipidemia   . Hypertension   . NEPHROLITHIASIS,  HX OF   . Obesity   . PAF (paroxysmal atrial fibrillation) ([place])    a. H/o such, with recurrence of coarse afib/flutter during ER consult 03/2012.  . Stroke ([place])   . TRANSIENT ISCHEMIC ATTACK, HX OF    a. In 2004.  [name] Unspecified vitamin D deficiency    Assessment: 49 YOM presenting with SOB and N/V, on warfarin PTA for Afib and mechanical AVR. Vegetation seen on TTE, will need to hold warfarin and begin IV heparin with impending need for procedures.  INR this morning continues to be therapeutic at 2.6, CBC has been stable, however hgb down this am 10.6>8.9.   PTA dosing: 5mg  MWF, 7.5mg  all other days  4/25 AM update:  Heparin level just below goal  Goal of Therapy:  Heparin level 0.3-0.7 units/mL INR 2.5-3.5 Monitor platelets by anticoagulation protocol: Yes   Plan:  Inc heparin to 1950 units/hr 1430 heparin level   Narda Bonds, PharmD, [place] Pharmacist Phone: [phone]

## 2019-09-16 ENCOUNTER — Inpatient Hospital Stay (HOSPITAL_COMMUNITY)
Admission: EM | Disposition: A | Discharge status: Home Health | Payer: Self-pay | Source: Home / Self Care | Attending: Surgery

## 2019-09-16 DIAGNOSIS — R0989 Other specified symptoms and signs involving the circulatory and respiratory systems: Secondary | ICD-10-CM | POA: Diagnosis not present

## 2019-09-16 DIAGNOSIS — R9389 Abnormal findings on diagnostic imaging of other specified body structures: Secondary | ICD-10-CM | POA: Diagnosis not present

## 2019-09-16 DIAGNOSIS — T826XXA Infection and inflammatory reaction due to cardiac valve prosthesis, initial encounter: Secondary | ICD-10-CM | POA: Diagnosis not present

## 2019-09-16 DIAGNOSIS — I330 Acute and subacute infective endocarditis: Secondary | ICD-10-CM | POA: Diagnosis not present

## 2019-09-16 DIAGNOSIS — B954 Other streptococcus as the cause of diseases classified elsewhere: Secondary | ICD-10-CM | POA: Diagnosis not present

## 2019-09-16 DIAGNOSIS — M79675 Pain in left toe(s): Secondary | ICD-10-CM

## 2019-09-16 DIAGNOSIS — I25810 Atherosclerosis of coronary artery bypass graft(s) without angina pectoris: Secondary | ICD-10-CM | POA: Diagnosis not present

## 2019-09-16 HISTORY — PX: RIGHT HEART CATH AND CORONARY/GRAFT ANGIOGRAPHY: CATH118265

## 2019-09-16 LAB — POCT I-STAT 7, (LYTES, BLD GAS, ICA,H+H)
Acid-base deficit: 2 mmol/L (ref 0.0–2.0)
Bicarbonate: 22.8 mmol/L (ref 20.0–28.0)
Calcium, Ion: 1.24 mmol/L (ref 1.15–1.40)
HCT: 24 % — ABNORMAL LOW (ref 39.0–52.0)
Hemoglobin: 8.2 g/dL — ABNORMAL LOW (ref 13.0–17.0)
O2 Saturation: 99 %
Potassium: 3.7 mmol/L (ref 3.5–5.1)
Sodium: 139 mmol/L (ref 135–145)
TCO2: 24 mmol/L (ref 22–32)
pCO2 arterial: 35.6 mmHg (ref 32.0–48.0)
pH, Arterial: 7.414 (ref 7.350–7.450)
pO2, Arterial: 140 mmHg — ABNORMAL HIGH (ref 83.0–108.0)

## 2019-09-16 LAB — CBC
HCT: 26 % — ABNORMAL LOW (ref 39.0–52.0)
Hemoglobin: 9.3 g/dL — ABNORMAL LOW (ref 13.0–17.0)
MCH: 29.2 pg (ref 26.0–34.0)
MCHC: 35.8 g/dL (ref 30.0–36.0)
MCV: 81.8 fL (ref 80.0–100.0)
Platelets: 219 K/uL (ref 150–400)
RBC: 3.18 MIL/uL — ABNORMAL LOW (ref 4.22–5.81)
RDW: 14.2 % (ref 11.5–15.5)
WBC: 9.9 K/uL (ref 4.0–10.5)
nRBC: 0 % (ref 0.0–0.2)

## 2019-09-16 LAB — BASIC METABOLIC PANEL WITH GFR
Anion gap: 7 (ref 5–15)
BUN: 12 mg/dL (ref 6–20)
CO2: 23 mmol/L (ref 22–32)
Calcium: 8.4 mg/dL — ABNORMAL LOW (ref 8.9–10.3)
Chloride: 106 mmol/L (ref 98–111)
Creatinine, Ser: 1.17 mg/dL (ref 0.61–1.24)
GFR calc Af Amer: >60 mL/min
GFR calc non Af Amer: >60 mL/min
Glucose, Bld: 164 mg/dL — ABNORMAL HIGH (ref 70–99)
Potassium: 3.8 mmol/L (ref 3.5–5.1)
Sodium: 136 mmol/L (ref 135–145)

## 2019-09-16 LAB — GLUCOSE, CAPILLARY
Glucose-Capillary: 147 mg/dL — ABNORMAL HIGH (ref 70–99)
Glucose-Capillary: 118 mg/dL — ABNORMAL HIGH (ref 70–99)
Glucose-Capillary: 150 mg/dL — ABNORMAL HIGH (ref 70–99)
Glucose-Capillary: 164 mg/dL — ABNORMAL HIGH (ref 70–99)

## 2019-09-16 LAB — POCT I-STAT EG7
Acid-base deficit: 1 mmol/L (ref 0.0–2.0)
Bicarbonate: 23.4 mmol/L (ref 20.0–28.0)
Calcium, Ion: 1.24 mmol/L (ref 1.15–1.40)
HCT: 25 % — ABNORMAL LOW (ref 39.0–52.0)
Hemoglobin: 8.5 g/dL — ABNORMAL LOW (ref 13.0–17.0)
O2 Saturation: 72 %
Potassium: 3.7 mmol/L (ref 3.5–5.1)
Sodium: 140 mmol/L (ref 135–145)
TCO2: 24 mmol/L (ref 22–32)
pCO2, Ven: 38.1 mmHg — ABNORMAL LOW (ref 44.0–60.0)
pH, Ven: 7.396 (ref 7.250–7.430)
pO2, Ven: 38 mmHg (ref 32.0–45.0)

## 2019-09-16 LAB — SEDIMENTATION RATE: Sed Rate: 122 mm/hr — ABNORMAL HIGH (ref 0–16)

## 2019-09-16 LAB — FOLATE: Folate: 9.3 ng/mL (ref >5.9)

## 2019-09-16 LAB — HEPARIN LEVEL (UNFRACTIONATED): Heparin Unfractionated: 0.38 IU/mL (ref 0.30–0.70)

## 2019-09-16 LAB — VITAMIN B12: Vitamin B-12: 285 pg/mL (ref 180–914)

## 2019-09-16 LAB — SURGICAL PCR SCREEN
MRSA, PCR: NEGATIVE
Staphylococcus aureus: NEGATIVE

## 2019-09-16 LAB — PROTIME-INR
INR: 1.7 — ABNORMAL HIGH (ref 0.8–1.2)
Prothrombin Time: 20.3 seconds — ABNORMAL HIGH (ref 11.4–15.2)

## 2019-09-16 LAB — URIC ACID: Uric Acid, Serum: 7.4 mg/dL (ref 3.7–8.6)

## 2019-09-16 SURGERY — RIGHT HEART CATH AND CORONARY/GRAFT ANGIOGRAPHY
Anesthesia: LOCAL

## 2019-09-16 MED ORDER — HEPARIN (PORCINE) 25000 UT/250ML-% IV SOLN
1950.0000 [IU]/h | INTRAVENOUS | Status: AC
  Administered 2019-09-16: 18:00:00 1950 [IU]/h via INTRAVENOUS

## 2019-09-16 MED ORDER — HEPARIN (PORCINE) IN NACL 1000-0.9 UT/500ML-% IV SOLN
INTRAVENOUS | Status: DC | PRN
  Administered 2019-09-16: 500 mL

## 2019-09-16 MED ORDER — SODIUM CHLORIDE 0.9 % IV SOLN: 250.0000 mL | INTRAVENOUS | Status: DC | PRN

## 2019-09-16 MED ORDER — SODIUM CHLORIDE 0.9% FLUSH
3.0000 mL | Freq: Two times a day (BID) | INTRAVENOUS | Status: DC
  Administered 2019-09-17 – 2019-09-25 (×9): 3 mL via INTRAVENOUS

## 2019-09-16 MED ORDER — SODIUM CHLORIDE 0.9 % WEIGHT BASED INFUSION
1.0000 mL/kg/h | INTRAVENOUS | Status: AC
  Administered 2019-09-16: 10:00:00 1 mL/kg/h via INTRAVENOUS

## 2019-09-16 MED ORDER — LIDOCAINE HCL (PF) 1 % IJ SOLN
INTRAMUSCULAR | Status: DC | PRN
  Administered 2019-09-16: 15 mL

## 2019-09-16 MED ORDER — SODIUM CHLORIDE 0.9% FLUSH: 3.0000 mL | INTRAVENOUS | Status: DC | PRN

## 2019-09-16 MED ORDER — MIDAZOLAM HCL 2 MG/2ML IJ SOLN
INTRAMUSCULAR | Status: DC | PRN
  Administered 2019-09-16: 2 mg via INTRAVENOUS

## 2019-09-16 MED ORDER — LIDOCAINE HCL (PF) 1 % IJ SOLN
INTRAMUSCULAR | Status: AC
  Filled 2019-09-16: qty 30

## 2019-09-16 MED ORDER — MUPIROCIN 2 % EX OINT
1.0000 "application " | TOPICAL_OINTMENT | Freq: Two times a day (BID) | CUTANEOUS | Status: AC
  Administered 2019-09-16 – 2019-09-20 (×9): 1 via NASAL
  Filled 2019-09-16 (×2): qty 22

## 2019-09-16 MED ORDER — HEPARIN (PORCINE) IN NACL 1000-0.9 UT/500ML-% IV SOLN
INTRAVENOUS | Status: AC
  Filled 2019-09-16: qty 1000

## 2019-09-16 MED ORDER — IOHEXOL 350 MG/ML SOLN
INTRAVENOUS | Status: DC | PRN
  Administered 2019-09-16: 80 mL

## 2019-09-16 MED ORDER — MIDAZOLAM HCL 2 MG/2ML IJ SOLN
INTRAMUSCULAR | Status: AC
  Filled 2019-09-16: qty 2

## 2019-09-16 MED ORDER — VERAPAMIL HCL 2.5 MG/ML IV SOLN
INTRAVENOUS | Status: AC
  Filled 2019-09-16: qty 2

## 2019-09-16 MED ORDER — FENTANYL CITRATE (PF) 100 MCG/2ML IJ SOLN
INTRAMUSCULAR | Status: DC | PRN
  Administered 2019-09-16: 25 ug via INTRAVENOUS

## 2019-09-16 MED ORDER — FENTANYL CITRATE (PF) 100 MCG/2ML IJ SOLN
INTRAMUSCULAR | Status: AC
  Filled 2019-09-16: qty 2

## 2019-09-16 MED ORDER — HEPARIN SODIUM (PORCINE) 1000 UNIT/ML IJ SOLN
INTRAMUSCULAR | Status: AC
  Filled 2019-09-16: qty 1

## 2019-09-16 SURGICAL SUPPLY — 12 items
CATH INFINITI 5FR MULTPACK ANG (CATHETERS) ×1 IMPLANT
CATH SWAN GANZ 7F STRAIGHT (CATHETERS) ×1 IMPLANT
CLOSURE MYNX CONTROL 5F (Vascular Products) ×1 IMPLANT
CLOSURE MYNX CONTROL 6F/7F (Vascular Products) ×1 IMPLANT
KIT HEART LEFT (KITS) ×2 IMPLANT
PACK CARDIAC CATHETERIZATION (CUSTOM PROCEDURE TRAY) ×2 IMPLANT
SHEATH PINNACLE 5F 10CM (SHEATH) ×1 IMPLANT
SHEATH PINNACLE 7F 10CM (SHEATH) ×1 IMPLANT
SHEATH PROBE COVER 6X72 (BAG) ×1 IMPLANT
TRANSDUCER W/STOPCOCK (MISCELLANEOUS) ×2 IMPLANT
TUBING CIL FLEX 10 FLL-RA (TUBING) ×2 IMPLANT
WIRE EMERALD 3MM-J .035X150CM (WIRE) ×1 IMPLANT

## 2019-09-16 NOTE — Progress Notes (Signed)
Subjective:  Is having some pain in his left great toe   Antibiotics:  Anti-infectives (From admission, onward)   Start     Dose/Rate Route Frequency Ordered Stop   09/14/19 1430  metroNIDAZOLE (FLAGYL) tablet 500 mg     500 mg Oral Every 8 hours 09/14/19 1348     09/11/19 1700  gentamicin (GARAMYCIN) 300 mg in dextrose 5 % 50 mL IVPB     300 mg 115 mL/hr over 30 Minutes Intravenous Every 24 hours 09/11/19 1540     09/10/19 2300  vancomycin (VANCOREADY) IVPB 1750 mg/350 mL  Status:  Discontinued     1,750 mg 175 mL/hr over 120 Minutes Intravenous Every 24 hours 09/10/19 0721 09/11/19 1511   09/10/19 0600  vancomycin (VANCOREADY) IVPB 1250 mg/250 mL  Status:  Discontinued     1,250 mg 166.7 mL/hr over 90 Minutes Intravenous Every 12 hours 09/09/19 1345 09/10/19 0721   09/09/19 1430  cefTRIAXone (ROCEPHIN) 2 g in sodium chloride 0.9 % 100 mL IVPB     2 g 200 mL/hr over 30 Minutes Intravenous Every 24 hours 09/09/19 1335     09/09/19 1430  vancomycin (VANCOCIN) 2,500 mg in sodium chloride 0.9 % 500 mL IVPB     2,500 mg 250 mL/hr over 120 Minutes Intravenous  Once 09/09/19 1345 09/09/19 1847      Medications: Scheduled Meds: . aspirin EC  81 mg Oral Daily  . atorvastatin  40 mg Oral Q breakfast  . colchicine  0.6 mg Oral BID  . gabapentin  300 mg Oral Q breakfast  . insulin aspart  0-15 Units Subcutaneous TID WC  . insulin aspart  0-5 Units Subcutaneous QHS  . insulin aspart  6 Units Subcutaneous TID WC  . insulin detemir  6 Units Subcutaneous BID  . levothyroxine  50 mcg Oral Q0600  . metroNIDAZOLE  500 mg Oral Q8H  . mupirocin ointment  1 application Nasal BID  . polyethylene glycol  17 g Oral BID  . sodium chloride flush  3 mL Intravenous Once  . sodium chloride flush  3 mL Intravenous Q12H   Continuous Infusions: . sodium chloride    . sodium chloride 1 mL/kg/hr (09/16/19 1020)  . cefTRIAXone (ROCEPHIN)  IV 2 g (09/16/19 1022)  . gentamicin 300 mg  (09/15/19 1439)  . heparin     PRN Meds:.sodium chloride, acetaminophen **OR** acetaminophen, nitroGLYCERIN, ondansetron **OR** ondansetron (ZOFRAN) IV, sodium chloride flush    Objective: Weight change: 0.726 kg  Intake/Output Summary (Last 24 hours) at 09/16/2019 1250 Last data filed at 09/16/2019 0830 Gross per 24 hour  Intake 758.13 ml  Output 1120 ml  Net -361.87 ml   Blood pressure (!) 141/69, pulse 87, temperature 99.1 F (37.3 C), temperature source Oral, resp. rate (!) 30, height 6\' 2"  (1.88 m), weight 130.3 kg, SpO2 98 %. Temp:  [98.8 F (37.1 C)-99.1 F (37.3 C)] 99.1 F (37.3 C) (04/26 0307) Pulse Rate:  [81-98] 87 (04/26 1210) Resp:  [4-34] 30 (04/26 0950) BP: (129-158)/(63-99) 141/69 (04/26 1210) SpO2:  [95 %-100 %] 98 % (04/26 1210) Weight:  [130.3 kg] 130.3 kg (04/26 0307)  Physical Exam: General: Alert and awake, oriented x3, not in any acute distress. HEENT: anicteric sclera, EOMI CVS regular rate, normal  Chest: , no wheezing, no respiratory distress Abdomen: soft non-distended,  Extremities: Left great toe is exquisitely tender to palpation skin: no rashes Neuro: nonfocal  CBC:    BMET  Recent Labs    09/15/19 0530 09/16/19 0754  NA 137 136  K 3.8 3.8  CL 106 106  CO2 23 23  GLUCOSE 142* 164*  BUN 13 12  CREATININE 1.21 1.17  CALCIUM 8.4* 8.4*     Liver Panel  Recent Labs    09/14/19 0339 09/15/19 0530  PROT 6.8 6.6  ALBUMIN 2.3* 2.4*  AST 21 21  ALT 12 13  ALKPHOS 70 67  BILITOT 0.8 1.1  BILIDIR 0.2  --   IBILI 0.6  --        Sedimentation Rate Recent Labs    09/16/19 0302  ESRSEDRATE 122*   C-Reactive Protein No results for input(s): CRP in the last 72 hours.  Micro Results: Recent Results (from the past 720 hour(s))  SARS CORONAVIRUS 2 (TAT 6-24 HRS) Nasopharyngeal Nasopharyngeal Swab     Status: None   Collection Time: 09/08/19  1:50 PM   Specimen: Nasopharyngeal Swab  Result Value Ref Range Status   SARS  Coronavirus 2 NEGATIVE NEGATIVE Final    Comment: (NOTE) SARS-CoV-2 target nucleic acids are NOT DETECTED. The SARS-CoV-2 RNA is generally detectable in upper and lower respiratory specimens during the acute phase of infection. Negative results do not preclude SARS-CoV-2 infection, do not rule out co-infections with other pathogens, and should not be used as the sole basis for treatment or other patient management decisions. Negative results must be combined with clinical observations, patient history, and epidemiological information. The expected result is Negative. Fact Sheet for Patients: SugarRoll.be Fact Sheet for Healthcare Providers: https://www.woods-mathews.com/ This test is not yet approved or cleared by the [place] FDA and  has been authorized for detection and/or diagnosis of SARS-CoV-2 by FDA under an Emergency Use Authorization (EUA). This EUA will remain  in effect (meaning this test can be used) for the duration of the COVID-19 declaration under Section 56 4(b)(1) of the Act, 21 U.S.C. section 360bbb-3(b)(1), unless the authorization is terminated or revoked sooner. Performed at [facility], [place] [address] [place], [place] [address]   Gastrointestinal Panel by PCR , Stool     Status: None   Collection Time: 09/09/19 12:21 PM   Specimen: Stool  Result Value Ref Range Status   Campylobacter species NOT DETECTED NOT DETECTED Final   Plesimonas shigelloides NOT DETECTED NOT DETECTED Final   Salmonella species NOT DETECTED NOT DETECTED Final   Yersinia enterocolitica NOT DETECTED NOT DETECTED Final   Vibrio species NOT DETECTED NOT DETECTED Final   Vibrio cholerae NOT DETECTED NOT DETECTED Final   Enteroaggregative E coli (EAEC) NOT DETECTED NOT DETECTED Final   Enteropathogenic E coli (EPEC) NOT DETECTED NOT DETECTED Final   Enterotoxigenic E coli (ETEC) NOT DETECTED NOT DETECTED Final   Shiga like toxin producing  E coli (STEC) NOT DETECTED NOT DETECTED Final   Shigella/Enteroinvasive E coli (EIEC) NOT DETECTED NOT DETECTED Final   Cryptosporidium NOT DETECTED NOT DETECTED Final   Cyclospora cayetanensis NOT DETECTED NOT DETECTED Final   Entamoeba histolytica NOT DETECTED NOT DETECTED Final   Giardia lamblia NOT DETECTED NOT DETECTED Final   Adenovirus F40/41 NOT DETECTED NOT DETECTED Final   Astrovirus NOT DETECTED NOT DETECTED Final   Norovirus GI/GII NOT DETECTED NOT DETECTED Final   Rotavirus A NOT DETECTED NOT DETECTED Final   Sapovirus (I, II, IV, and V) NOT DETECTED NOT DETECTED Final    Comment: Performed at [facility], [address], [place], [place] [address]  Culture, blood (Routine  X 2) w Reflex to ID Panel     Status: Abnormal   Collection Time: 09/09/19  1:12 PM   Specimen: BLOOD  Result Value Ref Range Status   Specimen Description BLOOD RIGHT ANTECUBITAL  Final   Special Requests   Final    BOTTLES DRAWN AEROBIC AND ANAEROBIC Blood Culture adequate volume Performed at [facility], [place] [address] [place], [place] [address]    Culture  Setup Time   Final    GRAM POSITIVE COCCI ANAEROBIC BOTTLE ONLY CRITICAL RESULT CALLED TO, READ BACK BY AND VERIFIED WITH: PHARMD SEAY LAURA AT 0546  BY MESSAN HOUEGNIFIO ON 09/10/2019     Culture STREPTOCOCCUS INFANTARIUS (A)  Final   Report Status 09/12/2019 FINAL  Final   Organism ID, Bacteria STREPTOCOCCUS INFANTARIUS  Final      Susceptibility   Streptococcus infantarius - MIC*    PENICILLIN 0.25 INTERMEDIATE Intermediate     CEFTRIAXONE 0.25 SENSITIVE Sensitive     ERYTHROMYCIN <=0.12 SENSITIVE Sensitive     LEVOFLOXACIN 2 SENSITIVE Sensitive     VANCOMYCIN <=0.12 SENSITIVE Sensitive     * STREPTOCOCCUS INFANTARIUS  Culture, blood (Routine X 2) w Reflex to ID Panel     Status: Abnormal   Collection Time: 09/09/19  1:12 PM   Specimen: BLOOD LEFT HAND  Result Value Ref Range Status   Specimen Description BLOOD LEFT  HAND  Final   Special Requests   Final    BOTTLES DRAWN AEROBIC AND ANAEROBIC Blood Culture adequate volume   Culture  Setup Time   Final    AEROBIC BOTTLE ONLY GRAM POSITIVE COCCI IN CHAINS GRAM POSITIVE COCCI IN CLUSTERS CRITICAL VALUE NOTED.  VALUE IS CONSISTENT WITH PREVIOUSLY REPORTED AND CALLED VALUE.    Culture (A)  Final    STREPTOCOCCUS INFANTARIUS SUSCEPTIBILITIES PERFORMED ON PREVIOUS CULTURE WITHIN THE LAST 5 DAYS. Performed at [facility], [place] [address], [place], [place] [address]    Report Status 09/12/2019 FINAL  Final  Blood Culture ID Panel (Reflexed)     Status: Abnormal   Collection Time: 09/09/19  1:12 PM  Result Value Ref Range Status   Enterococcus species NOT DETECTED NOT DETECTED Final   Listeria monocytogenes NOT DETECTED NOT DETECTED Final   Staphylococcus species NOT DETECTED NOT DETECTED Final   Staphylococcus aureus (BCID) NOT DETECTED NOT DETECTED Final   Streptococcus species DETECTED (A) NOT DETECTED Final    Comment: Not Enterococcus species, Streptococcus agalactiae, Streptococcus pyogenes, or Streptococcus pneumoniae. CRITICAL RESULT CALLED TO, READ BACK BY AND VERIFIED WITH: Criss Rosales PharmD 12:50 09/10/19 ([email or web address])    Streptococcus agalactiae NOT DETECTED NOT DETECTED Final   Streptococcus pneumoniae NOT DETECTED NOT DETECTED Final   Streptococcus pyogenes NOT DETECTED NOT DETECTED Final   Acinetobacter baumannii NOT DETECTED NOT DETECTED Final   Enterobacteriaceae species NOT DETECTED NOT DETECTED Final   Enterobacter cloacae complex NOT DETECTED NOT DETECTED Final   Escherichia coli NOT DETECTED NOT DETECTED Final   Klebsiella oxytoca NOT DETECTED NOT DETECTED Final   Klebsiella pneumoniae NOT DETECTED NOT DETECTED Final   Proteus species NOT DETECTED NOT DETECTED Final   Serratia marcescens NOT DETECTED NOT DETECTED Final   Haemophilus influenzae NOT DETECTED NOT DETECTED Final   Neisseria meningitidis NOT DETECTED NOT DETECTED Final     Pseudomonas aeruginosa NOT DETECTED NOT DETECTED Final   Candida albicans NOT DETECTED NOT DETECTED Final   Candida glabrata NOT DETECTED NOT DETECTED Final   Candida krusei NOT  DETECTED NOT DETECTED Final   Candida parapsilosis NOT DETECTED NOT DETECTED Final   Candida tropicalis NOT DETECTED NOT DETECTED Final    Comment: Performed at [facility], [place] [address], [place], [place] [address]  Culture, blood (routine x 2)     Status: None   Collection Time: 09/10/19  2:46 PM   Specimen: BLOOD RIGHT FOREARM  Result Value Ref Range Status   Specimen Description BLOOD RIGHT FOREARM  Final   Special Requests   Final    BOTTLES DRAWN AEROBIC AND ANAEROBIC Blood Culture adequate volume   Culture   Final    NO GROWTH 5 DAYS Performed at [facility], [place] [address], [place], [place] [address]    Report Status 09/15/2019 FINAL  Final  Culture, blood (routine x 2)     Status: None   Collection Time: 09/10/19  2:51 PM   Specimen: BLOOD RIGHT WRIST  Result Value Ref Range Status   Specimen Description BLOOD RIGHT WRIST  Final   Special Requests   Final    BOTTLES DRAWN AEROBIC ONLY Blood Culture adequate volume   Culture   Final    NO GROWTH 5 DAYS Performed at [facility], [place] [address] [place], [place] [address]    Report Status 09/15/2019 FINAL  Final    Studies/Results: CARDIAC CATHETERIZATION  Result Date: 09/16/2019  Ost RCA to Prox RCA lesion is 100% stenosed.  SVG to RVA with Origin lesion is 80% stenosed in stent.  Hemodynamic findings consistent with mild pulmonary hypertension.  1. Single vessel occlusive CAD with CTO of the ostium of the RCA 2. SVG to RCA with 80% in stent stenosis at the ostium. The RCA is a large vessel. 3. Mildly elevated LV filling pressures 4. Mild pulmonary HTN 5. Normal cardiac output. Plan: Redo AVR and CABG to RCA.   DG Foot Complete Left  Result Date: 09/15/2019 CLINICAL DATA:  Gout. EXAM: LEFT FOOT - COMPLETE 3+ VIEW  COMPARISON:  None. FINDINGS: There is no evidence of fracture or dislocation. Very mild degenerative changes are seen involving the metatarsophalangeal joint of the left great toe. Soft tissues are unremarkable. IMPRESSION: Very mild degenerative changes without evidence of acute osseous abnormality. Electronically Signed   By: Virgina Norfolk M.D.   On: 09/15/2019 21:30   [place] EKG SITE RITE  Result Date: 09/14/2019 If Site Rite image not attached, placement could not be confirmed due to current cardiac rhythm.     Assessment/Plan:  INTERVAL HISTORY: Patient is status post cardiac catheterization today   Principal Problem:   Suspected endocarditis mechanical aortic valve Active Problems:   Diabetes mellitus type II, non insulin dependent (HCC)   Hyperlipidemia   Essential hypertension   PAF (paroxysmal atrial fibrillation) (HCC)   Hypothyroidism   Normocytic anemia   Shortness of breath   Obesity (BMI 30-39.9)   CAD (coronary artery disease)   Nausea vomiting and diarrhea   Gram-positive cocci bacteremia   Endocarditis of tricuspid valve    [name] is a 54 y.o. 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  Culprit organism is a Streptococcus infantilareus.  He also had a boil on his back which is apparently drained.  Finally he has been found to have a lesion on CT scan concerning for malignancy.  1.  Prosthetic valve endocarditis with Streptococcus infantilius:  Tinea ceftriaxone with gentamicin  2.  Final lesion will be biopsied when  able.  3.  Great toe tenderness: This may indeed be gout monitor   LOS: 8 days   Alcide Evener 09/16/2019, 12:50 PM

## 2019-09-16 NOTE — Progress Notes (Addendum)
[place] for warfarin>>heparin Indication: atrial fibrillation + mechanical AVR  Allergies  Allergen Reactions  . Diltiazem Hives  . Insulin Glargine Swelling    edema    Patient Measurements: Height: 6\' 2"  (188 cm) Weight: 130.3 kg (287 lb 3.2 oz) IBW/kg (Calculated) : 82.2  Vital Signs: Temp: 99.1 F (37.3 C) (04/26 0307) Temp Source: Oral (04/26 0307) BP: 148/94 (04/26 0950) Pulse Rate: 86 (04/26 0950)  Labs: Recent Labs     0000 09/14/19 0339 09/14/19 0909 09/14/19 1812 09/15/19 0530 09/15/19 1414 09/16/19 0302 09/16/19 0754  HGB   < >  --  9.8*  --  8.9*  --   --  9.3*  HCT  --   --  27.6*  --  25.0*  --   --  26.0*  PLT  --   --  230  --  212  --   --  219  LABPROT  --  25.0*  --   --  22.7*  --  20.3*  --   INR  --  2.3*  --   --  2.0*  --  1.7*  --   HEPARINUNFRC  --   --   --    < > 0.28* 0.35 0.38  --   CREATININE  --   --  1.21  --  1.21  --   --  1.17   < > = values in this interval not displayed.    Estimated Creatinine Clearance: 103.5 mL/min (by C-G formula based on SCr of 1.17 mg/dL).   Medical History: Past Medical History:  Diagnosis Date  . Ascending aortic dissection ([place])    a. 2000 - with aortic valve involvement. S/p repair of aortic dissection with placement of mechanical AVR.  [name] CAD (coronary artery disease)    a. 2000 VG->PDA @ time of Ao dissection repair;  b. Cardiac CT 06/2010: no CAD, only mild plaque in prox RCA;  c. 2015 Cath: LM nl, LAD nl, LCX nl, RCA 100, VG->PDA 90 (4.5x12 Rebel BMS). d. 10/23/17 instent restenosis SVG to RCA, DES placed.  . Cholelithiasis 08/22/2013  . Chronic diastolic CHF (congestive heart failure) ([place])    a. 03/2016 Echo: EF 55-60%, no rwma, Gr2 DD.  . Diabetes mellitus   . Dyspnea   . Gross hematuria   . H/O mechanical aortic valve replacement    a. 03/2016 Echo: AoV mean gradient 14mmHg, Valve Area (VTI) 1.36 cm^2, (Vmax) 1.22 cm^2, mod dil LA.  [name] Hyperlipidemia    . Hypertension   . NEPHROLITHIASIS, HX OF   . Obesity   . PAF (paroxysmal atrial fibrillation) ([place])    a. H/o such, with recurrence of coarse afib/flutter during ER consult 03/2012.  . Stroke ([place])   . TRANSIENT ISCHEMIC ATTACK, HX OF    a. In 2004.  [name] Unspecified vitamin D deficiency    Assessment: 63 YOM presenting with SOB and N/V, on warfarin PTA for Afib and mechanical AVR. Vegetation seen on TTE, will need to hold warfarin and begin IV heparin with impending need for procedures. He is s/p cath 4/26 with plans for CABG and redo AVR -sheath removed ~ 10am today -biopsy planned  4/27 -hg= 9.3  PTA dosing: 5mg  MWF, 7.5mg  all other days    Goal of Therapy:  Heparin level 0.3-0.7 units/mL INR 2.5-3.5 Monitor platelets by anticoagulation protocol: Yes   Plan:  Restart heparin at 6pm at 1950 units/hr -Heparin level in 6 hours  and daily wth CBC daily  Hildred Laser, PharmD Clinical Pharmacist **Pharmacist phone directory can now be found on [email or web address] ([email or web address]).  Listed under [place].  Addendum -heparin to stop on 4/27 at 1:30am for procedure  Plan -stop time added for heparin  Hildred Laser, PharmD Clinical Pharmacist **Pharmacist phone directory can now be found on [email or web address] ([email or web address]).  Listed under [place].

## 2019-09-16 NOTE — Progress Notes (Signed)
Stable from our standpoint.  Still needs CT biopsy of T10 when scheduling permits.

## 2019-09-16 NOTE — Anesthesia Preprocedure Evaluation (Addendum)
Anesthesia Evaluation  Patient identified by MRN, date of birth, ID band Patient awake    Reviewed: Allergy & Precautions, NPO status , Patient's Chart, lab work & pertinent test results  Airway Mallampati: III  TM Distance: >3 FB Neck ROM: Full    Dental  (+) Dental Advisory Given   Pulmonary shortness of breath,    breath sounds clear to auscultation       Cardiovascular hypertension, Pt. on medications + angina + CAD and +CHF  + dysrhythmias + Valvular Problems/Murmurs  Rhythm:Regular Rate:Normal     Neuro/Psych CVA    GI/Hepatic negative GI ROS, Neg liver ROS,   Endo/Other  diabetesHypothyroidism   Renal/GU negative Renal ROS     Musculoskeletal   Abdominal   Peds  Hematology  (+) anemia ,   Anesthesia Other Findings 54 y.o. 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  Numerous dental problems, likely source of endocarditis.   Reproductive/Obstetrics                            Lab Results  Component Value Date   WBC 9.9 09/16/2019   HGB 8.5 (L) 09/16/2019   HGB 8.2 (L) 09/16/2019   HCT 25.0 (L) 09/16/2019   HCT 24.0 (L) 09/16/2019   MCV 81.8 09/16/2019   PLT 219 09/16/2019   Lab Results  Component Value Date   CREATININE 1.17 09/16/2019   BUN 12 09/16/2019   NA 140 09/16/2019   NA 139 09/16/2019   K 3.7 09/16/2019   K 3.7 09/16/2019   CL 106 09/16/2019   CO2 23 09/16/2019    Anesthesia Physical Anesthesia Plan  ASA: IV  Anesthesia Plan: General   Post-op Pain Management:    Induction: Intravenous  PONV Risk Score and Plan: 2 and Treatment may vary due  to age or medical condition, Dexamethasone and Ondansetron  Airway Management Planned: Oral ETT  Additional Equipment:   Intra-op Plan:   Post-operative Plan: Extubation in OR  Informed Consent: I have reviewed the patients History and Physical, chart, labs and discussed the procedure including the risks, benefits and alternatives for the proposed anesthesia with the patient or authorized representative who has indicated his/her understanding and acceptance.     Dental advisory given  Plan Discussed with: CRNA  Anesthesia Plan Comments:        Anesthesia Quick Evaluation

## 2019-09-16 NOTE — Progress Notes (Signed)
Progress Note  Patient Name: [name] Date of Encounter: 09/16/2019  Primary Cardiologist: Kirk Ruths, MD   Subjective   No new complaints. Heart cath showed expected disease (occluded native RCA, stenosis in SVG-RCA), no other interval problems. ECG shows sinus rhythm with worsened PR prolongation (250 ms), increasing concern for periannular inflammation/abscess. (long QT is erroneous - algorithm measuring the P wave as a T wave).  Inpatient Medications    Scheduled Meds: . aspirin EC  81 mg Oral Daily  . atorvastatin  40 mg Oral Q breakfast  . colchicine  0.6 mg Oral BID  . gabapentin  300 mg Oral Q breakfast  . insulin aspart  0-15 Units Subcutaneous TID WC  . insulin aspart  0-5 Units Subcutaneous QHS  . insulin aspart  6 Units Subcutaneous TID WC  . insulin detemir  6 Units Subcutaneous BID  . levothyroxine  50 mcg Oral Q0600  . metroNIDAZOLE  500 mg Oral Q8H  . mupirocin ointment  1 application Nasal BID  . polyethylene glycol  17 g Oral BID  . sodium chloride flush  3 mL Intravenous Once  . sodium chloride flush  3 mL Intravenous Q12H   Continuous Infusions: . sodium chloride    . cefTRIAXone (ROCEPHIN)  IV 2 g (09/16/19 1022)  . gentamicin 300 mg (09/15/19 1439)  . heparin     PRN Meds: sodium chloride, acetaminophen **OR** acetaminophen, nitroGLYCERIN, ondansetron **OR** ondansetron (ZOFRAN) IV, sodium chloride flush   Vital Signs    Vitals:   09/16/19 1125 09/16/19 1135 09/16/19 1155 09/16/19 1210  BP: 135/63 139/75 140/83 (!) 141/69  Pulse: 98 87 85 87  Resp:    16  Temp:    98 F (36.7 C)  TempSrc:    Oral  SpO2: 99% 100% 100% 98%  Weight:      Height:        Intake/Output Summary (Last 24 hours) at 09/16/2019 1456 Last data filed at 09/16/2019 0830 Gross per 24 hour  Intake 758.13 ml  Output 1120 ml  Net -361.87 ml   Last 3 Weights 09/16/2019 09/15/2019 09/14/2019  Weight (lbs) 287 lb 3.2 oz 285 lb 9.6 oz 284 lb 6.3 oz  Weight  (kg) 130.273 kg 129.547 kg 129 kg      Telemetry    SR with long PR - Personally Reviewed  ECG    SR with longer PR - Personally Reviewed  Physical Exam  Obese. R groin access site w/o hematoma or bleeding. GEN: No acute distress.   Neck: No JVD Cardiac: RRR, crisp mechanical valve clicks, 2/6 Ao ejection murmur, no rubs or gallops.  Respiratory: Clear to auscultation bilaterally. GI: Soft, nontender, non-distended  MS: No edema; No deformity. Neuro:  Nonfocal  Psych: Normal affect   Labs    High Sensitivity Troponin:   Recent Labs  Lab 09/08/19 0810 09/08/19 1100  TROPONINIHS 34* 28*      Chemistry Recent Labs  Lab 09/13/19 0424 09/13/19 0424 09/14/19 0339 09/14/19 0909 09/14/19 0909 09/15/19 0530 09/16/19 0754 09/16/19 0936  NA 138   < >  --  136   < > 137 136 140  K 3.7   < >  --  3.8   < > 3.8 3.8 3.7  CL 107   < >  --  105  --  106 106  --   CO2 23   < >  --  22  --  23 23  --  GLUCOSE 160*   < >  --  249*  --  142* 164*  --   BUN 13   < >  --  15  --  13 12  --   CREATININE 1.01   < >  --  1.21  --  1.21 1.17  --   CALCIUM 8.4*   < >  --  8.7*  --  8.4* 8.4*  --   PROT 6.5  --  6.8  --   --  6.6  --   --   ALBUMIN 2.3*  --  2.3*  --   --  2.4*  --   --   AST 19  --  21  --   --  21  --   --   ALT 12  --  12  --   --  13  --   --   ALKPHOS 64  --  70  --   --  67  --   --   BILITOT 0.9  --  0.8  --   --  1.1  --   --   GFRNONAA >60   < >  --  >60  --  >60 >60  --   GFRAA >60   < >  --  >60  --  >60 >60  --   ANIONGAP 8   < >  --  9  --  8 7  --    < > = values in this interval not displayed.     Hematology Recent Labs  Lab 09/14/19 0909 09/14/19 0909 09/15/19 0530 09/16/19 0754 09/16/19 0936  WBC 9.2  --  8.6 9.9  --   RBC 3.40*  --  3.05* 3.18*  --   HGB 9.8*   < > 8.9* 9.3* 8.5*  HCT 27.6*   < > 25.0* 26.0* 25.0*  MCV 81.2  --  82.0 81.8  --   MCH 28.8  --  29.2 29.2  --   MCHC 35.5  --  35.6 35.8  --   RDW 14.4  --  14.4 14.2  --    PLT 230  --  212 219  --    < > = values in this interval not displayed.    BNPNo results for input(s): BNP, PROBNP in the last 168 hours.   DDimer No results for input(s): DDIMER in the last 168 hours.   Radiology    CARDIAC CATHETERIZATION  Result Date: 09/16/2019  Ost RCA to Prox RCA lesion is 100% stenosed.  SVG to RVA with Origin lesion is 80% stenosed in stent.  Hemodynamic findings consistent with mild pulmonary hypertension.  1. Single vessel occlusive CAD with CTO of the ostium of the RCA 2. SVG to RCA with 80% in stent stenosis at the ostium. The RCA is a large vessel. 3. Mildly elevated LV filling pressures 4. Mild pulmonary HTN 5. Normal cardiac output. Plan: Redo AVR and CABG to RCA.   DG Foot Complete Left  Result Date: 09/15/2019 CLINICAL DATA:  Gout. EXAM: LEFT FOOT - COMPLETE 3+ VIEW COMPARISON:  None. FINDINGS: There is no evidence of fracture or dislocation. Very mild degenerative changes are seen involving the metatarsophalangeal joint of the left great toe. Soft tissues are unremarkable. IMPRESSION: Very mild degenerative changes without evidence of acute osseous abnormality. Electronically Signed   By: Virgina Norfolk M.D.   On: 09/15/2019 21:30    Cardiac Studies  CATH today  Colon Flattery  RCA to Prox RCA lesion is 100% stenosed.  SVG to RVA with Origin lesion is 80% stenosed in stent.  Hemodynamic findings consistent with mild pulmonary hypertension.   1. Single vessel occlusive CAD with CTO of the ostium of the RCA 2. SVG to RCA with 80% in stent stenosis at the ostium. The RCA is a large vessel. 3. Mildly elevated LV filling pressures 4. Mild pulmonary HTN 5. Normal cardiac output.   Plan: Redo AVR and CABG to RCA.   TEE 09/11/2019  1. Left ventricular ejection fraction, by estimation, is 55 to 60%. The  left ventricle has normal function. The left ventricle has no regional  wall motion abnormalities. There is mild concentric left ventricular   hypertrophy.  2. Right ventricular systolic function is normal. The right ventricular  size is mildly enlarged. There is mildly elevated pulmonary artery  systolic pressure. The estimated right ventricular systolic pressure is  37.8 mmHg.  3. It appears that the left atrial appendage has been surgically resected  or oversown. Left atrial size was severely dilated. No left atrial/left  atrial appendage thrombus was detected. The LAA emptying velocity was 35  cm/s.  4. Right atrial size was severely dilated.  5. The mitral valve is normal in structure. Mild mitral valve  regurgitation.  6. There is a large, moderately mobile vegetation attached to the septal  leaflet of the tricuspid valve, measuring 27 mm in length and 11 mm in  width. It appears heterogeneous and friable.. Tricuspid valve  regurgitation is moderate.  7. There is a large vegetation attached to the medial aspect of posterior  disc or the posteromedial mechanical valve annulus. It is highly mobile  and prolapses readily across the valve orifice. It measures 15 mm in  length and 6 mm in width. Although  there is no evidence of a periannular abscess cavity, the periannular  tissue echodensity is heterogeneous, raining concern for  inflammation/early abscess formation. The proximity of the aortic and the  tricuspid vegetation to the same area of the  interventricular septum also raises concern for intramyocardial abscess or  fistula formation. The aortic valve has been repaired/replaced. Aortic  valve regurgitation is trivial. Mild aortic valve stenosis. There is a  unknown [employer] tilting disk valve  present in the aortic position. Procedure Date: 2000. Aortic valve mean  gradient measures 20.0 mmHg.  8. There is mild (Grade II) atheroma plaque involving the transverse  aorta.   Patient Profile     54 y.o. male with history of previous aortic dissection treated with mechanical AVR/Bentall and right coronary  artery reimplantation, presenting with streptococcusinfantarius endocarditis with vegetations on the mechanical aortic valve and on the tricuspid valve. 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  Numerous dental problems, likely source of endocarditis.  Gluteal area furuncle, spontaneously drained 4/23, currently packed.  Assessment & Plan    1. Endocarditis: Will require redo aortic valve replacement, probably with cadaveric homograft, tentatively scheduled for April 29. On antibiotics IV. Afebrile, WBC normal, follow-up blood cultures from 04 20/2021 are negative to date. ESR 124 (09/10/2019), will repeat in AM. Warfarin has been discontinued and he is on IV heparin, INR 2.0 today.  No signs of AI or CHF, but further prolongation of PR is concerning. ESR still 122. Watch closely for higher grade AV block. Still needs dental extractions and spine biopsy - hopefully both tomorrow.   2. CAD: He will have redo bypass to the RCA,  no other stenotic vessels  3. PAFib: without recent recurrence, on IV heparin.  4. T10 lytic lesion: He has had 4 months of pain in his mid right back, preceding diagnosis of endocarditis. Consider vertebral osteomyelitis versus plasmacytoma versus metastatic lesion. Scheduled for biopsy.  5. DM: Increased risk of infection/healing problems. Until recently glycemic control was excellent with hemoglobin A1c in the 6-7% range, on current admission up to 8.2%, possibly due to active infection.      For questions or updates, please contact [place] Please consult [email or web address] for contact info under        Signed, Sanda Klein, MD  09/16/2019, 2:56 PM

## 2019-09-16 NOTE — Care Management (Signed)
Per Barnie Del W/[employer]:  Colchicine 0.6mg  tab. (not covered) Mitigare 0.6mg  for 30 - 90 day caps twice a day $3.70.  No PA required No deductible  Tier -3 Retail pharmacy; [facility]. [id number]:[id number]

## 2019-09-16 NOTE — Consult Note (Signed)
Reason for Consult:Left foot pain Referring Physician: A [name] is an 54 y.o. male.  HPI: [name] was admitted to the hospital about a week ago with SOB. He was diagnosed with IE. During his stay he developed a buttock abscess and left 1st MTP joint pain. This got bad enough that he could not put weight down on that foot. However, it's gotten much better in the last 12h or so. He denies any hx/o similar or hx/o gout.  Past Medical History:  Diagnosis Date  . Ascending aortic dissection ([place])    a. 2000 - with aortic valve involvement. S/p repair of aortic dissection with placement of mechanical AVR.  [name] CAD (coronary artery disease)    a. 2000 VG->PDA @ time of Ao dissection repair;  b. Cardiac CT 06/2010: no CAD, only mild plaque in prox RCA;  c. 2015 Cath: LM nl, LAD nl, LCX nl, RCA 100, VG->PDA 90 (4.5x12 Rebel BMS). d. 10/23/17 instent restenosis SVG to RCA, DES placed.  . Cholelithiasis 08/22/2013  . Chronic diastolic CHF (congestive heart failure) ([place])    a. 03/2016 Echo: EF 55-60%, no rwma, Gr2 DD.  . Diabetes mellitus   . Dyspnea   . Gross hematuria   . H/O mechanical aortic valve replacement    a. 03/2016 Echo: AoV mean gradient 49mmHg, Valve Area (VTI) 1.36 cm^2, (Vmax) 1.22 cm^2, mod dil LA.  [name] Hyperlipidemia   . Hypertension   . NEPHROLITHIASIS, HX OF   . Obesity   . PAF (paroxysmal atrial fibrillation) ([place])    a. H/o such, with recurrence of coarse afib/flutter during ER consult 03/2012.  . Stroke ([place])   . TRANSIENT ISCHEMIC ATTACK, HX OF    a. In 2004.  [name] Unspecified vitamin D deficiency     Past Surgical History:  Procedure Laterality Date  . AORTIC VALVE REPLACEMENT  2000  . APPENDECTOMY  1998  . CORONARY ARTERY BYPASS GRAFT  2000  . CORONARY STENT INTERVENTION N/A 10/23/2017   Procedure: CORONARY STENT INTERVENTION;  Surgeon: [name], MD;  Location: [facility];  Service: Cardiovascular;  Laterality: N/A;  . LEFT AND RIGHT HEART  CATHETERIZATION WITH CORONARY ANGIOGRAM N/A 07/19/2013   Procedure: LEFT AND RIGHT HEART CATHETERIZATION WITH CORONARY ANGIOGRAM;  Surgeon: [name], MD;  Location: [facility] CATH LAB;  Service: Cardiovascular;  Laterality: N/A;  . LUMBAR LAMINECTOMY/DECOMPRESSION MICRODISCECTOMY Right 06/06/2018   Procedure: Right L3-4 disectomy;  Surgeon: Melina Schools, MD;  Location: [facility];  Service: Orthopedics;  Laterality: Right;  2.5 hrs  . RIGHT HEART CATH AND CORONARY/GRAFT ANGIOGRAPHY N/A 10/23/2017   Procedure: RIGHT HEART CATH AND CORONARY/GRAFT ANGIOGRAPHY;  Surgeon: [name], MD;  Location: [place] [facility];  Service: Cardiovascular;  Laterality: N/A;  . RIGHT HEART CATH AND CORONARY/GRAFT ANGIOGRAPHY N/A 09/16/2019   Procedure: RIGHT HEART CATH AND CORONARY/GRAFT ANGIOGRAPHY;  Surgeon: Martinique, Peter M, MD;  Location: [place] [facility];  Service: Cardiovascular;  Laterality: N/A;  . TEE WITHOUT CARDIOVERSION N/A 07/22/2013   Procedure: TRANSESOPHAGEAL ECHOCARDIOGRAM (TEE);  Surgeon: [name], MD;  Location: [facility];  Service: Cardiovascular;  Laterality: N/A;  . TEE WITHOUT CARDIOVERSION N/A 09/11/2019   Procedure: TRANSESOPHAGEAL ECHOCARDIOGRAM (TEE);  Surgeon: Sanda Klein, MD;  Location: [facility] ENDOSCOPY;  Service: Cardiovascular;  Laterality: N/A;    Family History  Problem Relation Age of Onset  . Coronary artery disease Mother   . Hypertension Mother   . Diabetes Brother   . Coronary  artery disease Other 66       male 1st degree relative, CABG in his 55's (father)  . Cancer Father        ? lung    Social History:  reports that he has never smoked. He has never used smokeless tobacco. He reports that he does not drink alcohol or use drugs.  Allergies:  Allergies  Allergen Reactions  . Diltiazem Hives  . Insulin Glargine Swelling    edema    Medications: I have reviewed the patient's current medications.  Results for orders placed or performed during the  hospital encounter of 09/08/19 (from the past 48 hour(s))  Glucose, capillary     Status: Abnormal   Collection Time: 09/14/19  4:42 PM  Result Value Ref Range   Glucose-Capillary 107 (H) 70 - 99 mg/dL    Comment: Glucose reference range applies only to samples taken after fasting for at least 8 hours.  Heparin level (unfractionated)     Status: Abnormal   Collection Time: 09/14/19  6:12 PM  Result Value Ref Range   Heparin Unfractionated <0.10 (L) 0.30 - 0.70 IU/mL    Comment: (NOTE) If heparin results are below expected values, and patient dosage has  been confirmed, suggest follow up testing of antithrombin III levels. Performed at [facility], [place] [address] [place] [address] [place], [place] [address]   Glucose, capillary     Status: Abnormal   Collection Time: 09/14/19  9:01 PM  Result Value Ref Range   Glucose-Capillary 152 (H) 70 - 99 mg/dL    Comment: Glucose reference range applies only to samples taken after fasting for at least 8 hours.  Protime-INR     Status: Abnormal   Collection Time: 09/15/19  5:30 AM  Result Value Ref Range   Prothrombin Time 22.7 (H) 11.4 - 15.2 seconds   INR 2.0 (H) 0.8 - 1.2    Comment: (NOTE) INR goal varies based on device and disease states. Performed at [facility], [place] [address] [place], [place] [address]   Heparin level (unfractionated)     Status: Abnormal   Collection Time: 09/15/19  5:30 AM  Result Value Ref Range   Heparin Unfractionated 0.28 (L) 0.30 - 0.70 IU/mL    Comment: (NOTE) If heparin results are below expected values, and patient dosage has  been confirmed, suggest follow up testing of antithrombin III levels. Performed at [facility], [place] [address], [place], [place] [address]   CBC with Differential/Platelet     Status: Abnormal   Collection Time: 09/15/19  5:30 AM  Result Value Ref Range   WBC 8.6 4.0 - 10.5 K/uL   RBC 3.05 (L) 4.22 - 5.81 MIL/uL   Hemoglobin 8.9 (L) 13.0 - 17.0 g/dL   HCT 25.0 (L) 39.0  - 52.0 %   MCV 82.0 80.0 - 100.0 fL   MCH 29.2 26.0 - 34.0 pg   MCHC 35.6 30.0 - 36.0 g/dL   RDW 14.4 11.5 - 15.5 %   Platelets 212 150 - 400 K/uL   nRBC 0.0 0.0 - 0.2 %   Neutrophils Relative % 72 %   Neutro Abs 6.2 1.7 - 7.7 K/uL   Lymphocytes Relative 16 %   Lymphs Abs 1.3 0.7 - 4.0 K/uL   Monocytes Relative 9 %   Monocytes Absolute 0.8 0.1 - 1.0 K/uL   Eosinophils Relative 2 %   Eosinophils Absolute 0.1 0.0 - 0.5 K/uL   Basophils Relative 0 %  Basophils Absolute 0.0 0.0 - 0.1 K/uL   Immature Granulocytes 1 %   Abs Immature Granulocytes 0.09 (H) 0.00 - 0.07 K/uL    Comment: Performed at [facility], [place] [address], [place], [place] [address]  Comprehensive metabolic panel     Status: Abnormal   Collection Time: 09/15/19  5:30 AM  Result Value Ref Range   Sodium 137 135 - 145 mmol/L   Potassium 3.8 3.5 - 5.1 mmol/L   Chloride 106 98 - 111 mmol/L   CO2 23 22 - 32 mmol/L   Glucose, Bld 142 (H) 70 - 99 mg/dL    Comment: Glucose reference range applies only to samples taken after fasting for at least 8 hours.   BUN 13 6 - 20 mg/dL   Creatinine, Ser 1.21 0.61 - 1.24 mg/dL   Calcium 8.4 (L) 8.9 - 10.3 mg/dL   Total Protein 6.6 6.5 - 8.1 g/dL   Albumin 2.4 (L) 3.5 - 5.0 g/dL   AST 21 15 - 41 U/L   ALT 13 0 - 44 U/L   Alkaline Phosphatase 67 38 - 126 U/L   Total Bilirubin 1.1 0.3 - 1.2 mg/dL   GFR calc non Af Amer >60 >60 mL/min   GFR calc Af Amer >60 >60 mL/min   Anion gap 8 5 - 15    Comment: Performed at [facility] [address] [place], [place] [address]  Magnesium     Status: None   Collection Time: 09/15/19  5:30 AM  Result Value Ref Range   Magnesium 1.7 1.7 - 2.4 mg/dL    Comment: Performed at [facility] [address], [place], [place] [address]  Phosphorus     Status: None   Collection Time: 09/15/19  5:30 AM  Result Value Ref Range   Phosphorus 3.7 2.5 - 4.6 mg/dL    Comment: Performed at [facility] [address],  [place], [place] [address]  Glucose, capillary     Status: Abnormal   Collection Time: 09/15/19  7:43 AM  Result Value Ref Range   Glucose-Capillary 153 (H) 70 - 99 mg/dL    Comment: Glucose reference range applies only to samples taken after fasting for at least 8 hours.  Glucose, capillary     Status: Abnormal   Collection Time: 09/15/19 11:44 AM  Result Value Ref Range   Glucose-Capillary 197 (H) 70 - 99 mg/dL    Comment: Glucose reference range applies only to samples taken after fasting for at least 8 hours.  Heparin level (unfractionated)     Status: None   Collection Time: 09/15/19  2:14 PM  Result Value Ref Range   Heparin Unfractionated 0.35 0.30 - 0.70 IU/mL    Comment: (NOTE) If heparin results are below expected values, and patient dosage has  been confirmed, suggest follow up testing of antithrombin III levels. Performed at [facility], [place] [address], [place], [place] [address]   Glucose, capillary     Status: Abnormal   Collection Time: 09/15/19  4:36 PM  Result Value Ref Range   Glucose-Capillary 185 (H) 70 - 99 mg/dL    Comment: Glucose reference range applies only to samples taken after fasting for at least 8 hours.  Gentamicin level, trough     Status: Abnormal   Collection Time: 09/15/19  7:27 PM  Result Value Ref Range   Gentamicin Trough 5.5 (HH) 0.5 - 2.0 ug/mL    Comment: CRITICAL  RESULT CALLED TO, READ BACK BY AND VERIFIED WITH: RN A TETE AT 2028 09/15/19 BY L BENFIELD Performed at [facility], [place] [address] [place], [place] [address]   Uric acid     Status: None   Collection Time: 09/15/19  7:27 PM  Result Value Ref Range   Uric Acid, Serum 7.1 3.7 - 8.6 mg/dL    Comment: Performed at [facility], [place] [address], [place], [place] [address]  Glucose, capillary     Status: Abnormal   Collection Time: 09/15/19  9:26 PM  Result Value Ref Range   Glucose-Capillary 154 (H) 70 - 99 mg/dL    Comment: Glucose reference range applies only to  samples taken after fasting for at least 8 hours.  Protime-INR     Status: Abnormal   Collection Time: 09/16/19  3:02 AM  Result Value Ref Range   Prothrombin Time 20.3 (H) 11.4 - 15.2 seconds   INR 1.7 (H) 0.8 - 1.2    Comment: (NOTE) INR goal varies based on device and disease states. Performed at [facility], [place] [address] [place], [place] [address]   Heparin level (unfractionated)     Status: None   Collection Time: 09/16/19  3:02 AM  Result Value Ref Range   Heparin Unfractionated 0.38 0.30 - 0.70 IU/mL    Comment: (NOTE) If heparin results are below expected values, and patient dosage has  been confirmed, suggest follow up testing of antithrombin III levels. Performed at [facility], [place] [address], [place], [place] [address]   Sedimentation rate     Status: Abnormal   Collection Time: 09/16/19  3:02 AM  Result Value Ref Range   Sed Rate 122 (H) 0 - 16 mm/hr    Comment: Performed at [facility] [address], [place], [place] [address]  Folate     Status: None   Collection Time: 09/16/19  3:02 AM  Result Value Ref Range   Folate 9.3 >5.9 ng/mL    Comment: Performed at [facility], [place] [address], [place], [place] [address]  Vitamin B12     Status: None   Collection Time: 09/16/19  3:02 AM  Result Value Ref Range   Vitamin B-12 285 180 - 914 pg/mL    Comment: (NOTE) This assay is not validated for testing neonatal or myeloproliferative syndrome specimens for Vitamin B12 levels. Performed at [facility], [place] [address] [place], [place] [address]   Uric acid     Status: None   Collection Time: 09/16/19  3:02 AM  Result Value Ref Range   Uric Acid, Serum 7.4 3.7 - 8.6 mg/dL    Comment: Performed at [facility] [address] [place], [place] [address]  Glucose, capillary     Status: Abnormal   Collection Time: 09/16/19  7:53 AM  Result Value Ref Range   Glucose-Capillary 147 (H) 70 - 99 mg/dL    Comment: Glucose  reference range applies only to samples taken after fasting for at least 8 hours.  CBC     Status: Abnormal   Collection Time: 09/16/19  7:54 AM  Result Value Ref Range   WBC 9.9 4.0 - 10.5 K/uL   RBC 3.18 (L) 4.22 - 5.81 MIL/uL   Hemoglobin 9.3 (L) 13.0 - 17.0 g/dL   HCT 26.0 (L) 39.0 - 52.0 %   MCV 81.8 80.0 - 100.0 fL   MCH 29.2 26.0 -  34.0 pg   MCHC 35.8 30.0 - 36.0 g/dL   RDW 14.2 11.5 - 15.5 %   Platelets 219 150 - 400 K/uL   nRBC 0.0 0.0 - 0.2 %    Comment: Performed at [facility], [place] [address] [place], [place] [id number]  Basic metabolic panel     Status: Abnormal   Collection Time: 09/16/19  7:54 AM  Result Value Ref Range   Sodium 136 135 - 145 mmol/L   Potassium 3.8 3.5 - 5.1 mmol/L   Chloride 106 98 - 111 mmol/L   CO2 23 22 - 32 mmol/L   Glucose, Bld 164 (H) 70 - 99 mg/dL    Comment: Glucose reference range applies only to samples taken after fasting for at least 8 hours.   BUN 12 6 - 20 mg/dL   Creatinine, Ser 1.17 0.61 - 1.24 mg/dL   Calcium 8.4 (L) 8.9 - 10.3 mg/dL   GFR calc non Af Amer >60 >60 mL/min   GFR calc Af Amer >60 >60 mL/min   Anion gap 7 5 - 15    Comment: Performed at [facility] [address] [place], [place] [address]  Glucose, capillary     Status: Abnormal   Collection Time: 09/16/19 11:06 AM  Result Value Ref Range   Glucose-Capillary 118 (H) 70 - 99 mg/dL    Comment: Glucose reference range applies only to samples taken after fasting for at least 8 hours.   Comment 1 Notify RN    Comment 2 Document in Chart     CARDIAC CATHETERIZATION  Result Date: 09/16/2019  Ost RCA to Prox RCA lesion is 100% stenosed.  SVG to RVA with Origin lesion is 80% stenosed in stent.  Hemodynamic findings consistent with mild pulmonary hypertension.  1. Single vessel occlusive CAD with CTO of the ostium of the RCA 2. SVG to RCA with 80% in stent stenosis at the ostium. The RCA is a large vessel. 3. Mildly elevated LV filling pressures 4. Mild  pulmonary HTN 5. Normal cardiac output. Plan: Redo AVR and CABG to RCA.   DG Foot Complete Left  Result Date: 09/15/2019 CLINICAL DATA:  Gout. EXAM: LEFT FOOT - COMPLETE 3+ VIEW COMPARISON:  None. FINDINGS: There is no evidence of fracture or dislocation. Very mild degenerative changes are seen involving the metatarsophalangeal joint of the left great toe. Soft tissues are unremarkable. IMPRESSION: Very mild degenerative changes without evidence of acute osseous abnormality. Electronically Signed   By: Virgina Norfolk M.D.   On: 09/15/2019 21:30   [place] EKG SITE RITE  Result Date: 09/14/2019 If Site Rite image not attached, placement could not be confirmed due to current cardiac rhythm.   Review of Systems  Constitutional: Negative for chills, diaphoresis and fever.  HENT: Negative for ear discharge, ear pain, hearing loss and tinnitus.   Eyes: Negative for photophobia and pain.  Respiratory: Negative for cough and shortness of breath.   Cardiovascular: Negative for chest pain.  Gastrointestinal: Negative for abdominal pain, nausea and vomiting.  Genitourinary: Negative for dysuria, flank pain, frequency and urgency.  Musculoskeletal: Positive for arthralgias (Right foot). Negative for back pain, myalgias and neck pain.  Neurological: Negative for dizziness and headaches.  Hematological: Does not bruise/bleed easily.  Psychiatric/Behavioral: The patient is not nervous/anxious.    Blood pressure (!) 141/69, pulse 87, temperature 99.1 F (37.3 C), temperature source Oral, resp. rate 16, height 6\' 2"  (1.88 m), weight 130.3 kg, SpO2 98 %. Physical  Exam  Constitutional: He appears well-developed and well-nourished. No distress.  HENT:  Head: Normocephalic and atraumatic.  Eyes: Conjunctivae are normal. Right eye exhibits no discharge. Left eye exhibits no discharge. No scleral icterus.  Cardiovascular: Normal rate and regular rhythm.  Respiratory: Effort normal. No respiratory distress.   Musculoskeletal:     Cervical back: Normal range of motion.     Comments: LLE No traumatic wounds, ecchymosis, or rash  Severe TTP 1st MTP joint, mild pain with PROM same  No knee or ankle effusion  Knee stable to varus/ valgus and anterior/posterior stress  Sens DPN, SPN, TN intact  Motor EHL, ext, flex, evers 5/5  DP 2+, PT 0, No significant edema  Neurological: He is alert.  Skin: Skin is warm and dry. He is not diaphoretic.  Psychiatric: He has a normal mood and affect. His behavior is normal.    Assessment/Plan: Left MTP joint pain -- Given his degree of movement sincerely doubt septic arthritis. Given uric acid elevation would place gout at top of differential. Given it's improving would not alter treatment nor pursue additional diagnostic tests. Multiple medical problems including hypertension, hyperlipidemia, paroxysmal A. fib on Coumadin, TIA in 2004, diabetes mellitus, coronary artery disease status post CABG in 2000, ascending aortic dissection in 2000 status post repair of aortic dissection with replacement of mechanical AVR -- per primary service    Lisette Abu, PA-C Orthopedic Surgery [phone] 09/16/2019, 1:11 PM

## 2019-09-16 NOTE — Care Management (Signed)
09-16-19 Benefits check submitted for colchicine tablet 0.6 mg : Dose 0.6 mg : Oral : 2 times daily. Case Manager will follow for cost. Graves-Bigelow, Ocie Cornfield, RN,BSN Case Manager

## 2019-09-16 NOTE — Progress Notes (Signed)
PROGRESS NOTE    [name]  [id number] DOB: 1965-10-07 DOA: 09/08/2019 PCP: Biagio Borg, MD    Chief Complaint  Patient presents with  . Shortness of Breath  . Emesis   Brief Narrative:  54 y.o.malewith history of CAD s/p CABG in 2000, SVG-RCA PCI with BMS in Feb 2015, he had re-do PCI in June 2019 DES to SVG graft, ascending aortic dissection in 2000 s/p repair with AVR on warfarin.  Paroxysmal Merced Hanners. fib.  NIDDM-2, HTN and HLD presenting with 5 days of chills, nausea, vomiting, diarrhea and dyspnea on exertion and orthopnea mainly with lying on the left side.  In ED, hemodynamically stable and saturating well on room air.  Mild temp to 100.1 F. Cr 1.31 (baseline 1 to 1.1).  BUN 17.  Hgb 10.4 (baseline 11-12).  BNP 535. HS trop 34> 28.  CXR without acute finding.  INR 2.3.  COVID-19 negative.  Cardiology consulted by EDP.  Admitted for nausea, vomiting, diarrhea and DOE.  Echocardiogram ordered by cardiology.  The next day, echo with EF of 55 to 60%, severe RAE, moderate LAE, 1.8 x 1.1 cm tricuspid valve mass on the atrial surface consistent with vegetation and moderate TVR.  Blood cultures with GPC in clusters in 1/2.  BCID with Streptococcus species.  ID consulted and started on ceftriaxone and vancomycin for infective endocarditis/bacteremia.  Repeat blood culture obtained on 4/20.   Assessment & Plan:   Principal Problem:   Suspected endocarditis mechanical aortic valve Active Problems:   Diabetes mellitus type II, non insulin dependent (HCC)   Hyperlipidemia   Essential hypertension   PAF (paroxysmal atrial fibrillation) (HCC)   Hypothyroidism   Normocytic anemia   Shortness of breath   Obesity (BMI 30-39.9)   CAD (coronary artery disease)   Nausea vomiting and diarrhea   Gram-positive cocci bacteremia   Endocarditis of tricuspid valve  Infective endocarditis  Strep Infantarius Bacteremia: Patient with fever and chills.  Echocardiogram with tricuspid valve  vegetation and regurgitation.   - blood cx 4/19 strep infantarius intermediate to penicillin - blood cx 4/20 cx NGTD - ID consulted, appreciate recommendations - continue ceftriaxone/gentamicin - consider colonoscopy prior to surgery for ? Polyps per ID - TEE with large vegetations on septal leaflet of the tricuspid valve and on the posterior disc of the mechanical aortic valve (see report) - Repeat blood culture 4/20 NGTD - ok for PICC - orthopantogram with dental caries and periodontal disease 4/21 - CT surgery c/s, planning for surgery in middle to end of next week (depending on prior to surgery eval/treatment) - planning for dental procedure on 4/27 at 7:30 AM  - cardiac catheterization 4/26 -> with single vessel occlusive CAD with CTO of the ostium of the RCA, SVG to RCA with 80% in stent stenosis at the ostium, mildly elevated LV filling pressures, mild pulm HTN -> plan for redo AVR and CABG to RCA - IR planning for bx of T10 vertebral lesion on 4/27 or 4/28 - CT angio chest abdomen pelvis   Left Buttocks Abscess: spontaneously drained.  Appreciate surgeries assistance, planning to pack wound.  Added flagyl.  Ok to remove packing (4/26) and cover with pad for drainage CCS will follow if needed  Left MTP Pain  Concern for Gout: in setting of above, will c/s orthopedics.  Started on colchicine for gout.  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   Planning for bone bx by IR either 4/27 or 4/28 Appreciate neurosurgery recommendations  Nausea/vomiting/diarrhea: Could be due to the above.  GIP negative.  GI symptoms resolved. -Continue symptomatic care -Discontinue enteric precautions  Iron Def Anemia: hb relatively stable, continue to follow Follow B12 (low normal, MMA pending), folate (wnl) Start  iron supplementation  Dyspnea on exertion/orthopnea:echo with EF of 55 to 60%, severe RAE, moderate LAE, 1.8 x 1.1 cm tricuspid valve mass on the atrial surface consistent with vegetation and moderate TVR.  No signs of fluid overload.  BNP slightly elevated but no vascular congestion on CXR.  Has history of asthma but not wheezing or coughing.  INR therapeutic to think of PE.  Overall, respiratory symptoms improved. -Hold home Lasix in the setting of AKI.  CAD s/p CABG in 2000, SVG-RCA PCI with BMS in Feb 2015, he had re-do PCI in June 2019 DES to SVG graft: Echocardiogram as above. -Continue home ASA and statin.   -Plavix on hold in the setting of #1.  Last dose on 4/19 -Metoprolol, losartan and Lasix on hold due to AKI and soft blood pressures  Elevated troponin: Likely demand ischemia in the setting of the above.  No exertional chest pain. -Cardiac meds as above  Ascending aortic dissection in 2000 s/p repair with Mechanical AVR on warfarin.  INR therapeutic. - heparin gtt for INR <2.5, started 4/24 -Monitor fluid status and renal function.  Paroxysmal Yared Susan. fib.  Rate controlled on metoprolol. -Metoprolol on hold per cardiology. -Anticoagulation as above.  Uncontrolled NIDDM-2 with hyperglycemia: A1c 8.2%. -Continue SSI-moderate -Start Levemir 6 units twice daily and NovoLog 6 units AC -Continue statin.  Check lipid panel  Acute kidney injury: Likely due to hypotension and nephrotoxic meds (vancomycin, Lasix and losartan) -Holding metoprolol, Lasix and losartan -Continue monitoring  Essential HTN:  -metoprolol, amlodipine and losartan on hold.  BP's reasonable  HLD  -Check lipid panel -Continue atorvastatin.  Hypothyroidism: -Continue home Synthroid.  At risk for sleep apnea: STOP BANG score 6-7 which puts him at high risk for sleep apnea. -Needs outpatient sleep study.  Morbid obesity: Body mass index is 36.03 kg/m.  With comorbidities as above. -Encourage  lifestyle change to lose weight.  DVT prophylaxis: heparin to start when INR decreases < 2.5 Code Status: full  Family Communication: wife at bedside Disposition:   Status is: Inpatient  Remains inpatient appropriate because:Inpatient level of care appropriate due to severity of illness   Dispo: The patient is from: Home              Anticipated d/c is to: pending              Anticipated d/c date is: > 3 days              Patient currently is not medically stable to d/c.   Consultants:   ID  Cardiology  CT surgery  Procedures:  Cardiac Cath  Ost RCA to Prox RCA lesion is 100% stenosed.  SVG to RVA with Origin lesion is 80% stenosed in stent.  Hemodynamic findings consistent with mild pulmonary hypertension.   1. Single vessel occlusive CAD with CTO of the ostium of the RCA 2. SVG to RCA with 80% in stent stenosis at the ostium. The RCA is [place] Perra large vessel. 3. Mildly elevated LV filling pressures 4. Mild pulmonary HTN 5. Normal cardiac output.   Plan: Redo AVR and CABG to RCA.   Transesophageal  Echo IMPRESSIONS    1. Left ventricular ejection fraction, by estimation, is 55 to 60%. The  left ventricle has normal function. The left ventricle has no regional  wall motion abnormalities. There is mild concentric left ventricular  hypertrophy.  2. Right ventricular systolic function is normal. The right ventricular  size is mildly enlarged. There is mildly elevated pulmonary artery  systolic pressure. The estimated right ventricular systolic pressure is  [id number] mmHg.  3. It appears that the left atrial appendage has been surgically resected  or oversown. Left atrial size was severely dilated. No left atrial/left  atrial appendage thrombus was detected. The LAA emptying velocity was 35  cm/s.  4. Right atrial size was severely dilated.  5. The mitral valve is normal in structure. Mild mitral valve  regurgitation.  6. There is Joana Nolton large, moderately mobile  vegetation attached to the septal  leaflet of the tricuspid valve, measuring 27 mm in length and 11 mm in  width. It appears heterogeneous and friable.. Tricuspid valve  regurgitation is moderate.  7. There is Shadoe Bethel large vegetation attached to the medial aspect of posterior  disc or the posteromedial mechanical valve annulus. It is highly mobile  and prolapses readily across the valve orifice. It measures 15 mm in  length and 6 mm in width. Although  there is no evidence of Korynn Kenedy periannular abscess cavity, the periannular  tissue echodensity is heterogeneous, raining concern for  inflammation/early abscess formation. The proximity of the aortic and the  tricuspid vegetation to the same area of the  interventricular septum also raises concern for intramyocardial abscess or  fistula formation. The aortic valve has been repaired/replaced. Aortic  valve regurgitation is trivial. Mild aortic valve stenosis. There is Laverne Hursey  unknown [employer] tilting disk valve  present in the aortic position. Procedure Date: 2000. Aortic valve mean  gradient measures 20.0 mmHg.  8. There is mild (Grade II) atheroma plaque involving the transverse  aorta.   Comparison(s): Compared to previous reports, there is evidence of  vegetations on the tricuspid valve and the mechanical aortic valve and  suspicion for early perivalvular abscess. The dimensionless aortic vale  index is similar to older studies. This  suggests that the increased aortic valve gradients are due to increased  cardiac output (rather than obstruction from the vegetation or compromised  prosthetic disc motion).   Echo IMPRESSIONS    1. Tricuspid valve vegetation. Discussed with referring provider.  2. Left ventricular ejection fraction, by estimation, is 55 to 60%. The  left ventricle has normal function. The left ventricle has no regional  wall motion abnormalities. Left ventricular diastolic parameters were  normal.  3. Right ventricular  systolic function is normal. The right ventricular  size is mildly enlarged. There is normal pulmonary artery systolic  pressure.  4. Left atrial size was moderately dilated.  5. Right atrial size was severely dilated.  6. The mitral valve is normal in structure. Mild mitral valve  regurgitation. No evidence of mitral stenosis.  7. There is Beverley Allender 1.8 x 1.1 cm tricuspid valve mass on the atrial surface  consistent with vegetation. . Tricuspid valve regurgitation is moderate.  8. Transaortic velocity is higher than expected for mechanical aortic  valve. The aortic valve is normal in structure. Aortic valve regurgitation  is not visualized. No aortic stenosis is present. There is Eliodoro Gullett mechanical  valve present in the aortic  position. Procedure Date: 2000. Aortic valve mean gradient measures 33.5  mmHg. Aortic valve Vmax measures 3.68  m/s.  9. Aortic root/ascending aorta has been repaired/replaced and [place].  10. The inferior vena cava is normal in size with greater than 50%  respiratory variability, suggesting right atrial pressure of 3 mmHg.  Antimicrobials:  Anti-infectives (From admission, onward)   Start     Dose/Rate Route Frequency Ordered Stop   09/14/19 1430  metroNIDAZOLE (FLAGYL) tablet 500 mg     500 mg Oral Every 8 hours 09/14/19 1348     09/11/19 1700  gentamicin (GARAMYCIN) 300 mg in dextrose 5 % 50 mL IVPB     300 mg 115 mL/hr over 30 Minutes Intravenous Every 24 hours 09/11/19 1540     09/10/19 2300  vancomycin (VANCOREADY) IVPB 1750 mg/350 mL  Status:  Discontinued     1,750 mg 175 mL/hr over 120 Minutes Intravenous Every 24 hours 09/10/19 0721 09/11/19 1511   09/10/19 0600  vancomycin (VANCOREADY) IVPB 1250 mg/250 mL  Status:  Discontinued     1,250 mg 166.7 mL/hr over 90 Minutes Intravenous Every 12 hours 09/09/19 1345 09/10/19 0721   09/09/19 1430  cefTRIAXone (ROCEPHIN) 2 g in sodium chloride 0.9 % 100 mL IVPB     2 g 200 mL/hr over 30 Minutes Intravenous Every  24 hours 09/09/19 1335     09/09/19 1430  vancomycin (VANCOCIN) 2,500 mg in sodium chloride 0.9 % 500 mL IVPB     2,500 mg 250 mL/hr over 120 Minutes Intravenous  Once 09/09/19 1345 09/09/19 1847      Subjective: L first toe better today No new complaints  Objective: Vitals:   09/16/19 1125 09/16/19 1135 09/16/19 1155 09/16/19 1210  BP: 135/63 139/75 140/83 (!) 141/69  Pulse: 98 87 85 87  Resp:    16  Temp:    98 F (36.7 C)  TempSrc:    Oral  SpO2: 99% 100% 100% 98%  Weight:      Height:        Intake/Output Summary (Last 24 hours) at 09/16/2019 1539 Last data filed at 09/16/2019 0830 Gross per 24 hour  Intake 518.13 ml  Output 1120 ml  Net -601.87 ml   Filed Weights   09/14/19 0506 09/15/19 0312 09/16/19 0307  Weight: 129 kg 129.5 kg 130.3 kg    Examination:  General: No acute distress. Cardiovascular: Heart sounds show Braedan Meuth regular rate, and rhythm.  Lungs: Clear to auscultation bilaterally  Abdomen: Soft, nontender, nondistended. Neurological: Alert and oriented 3. Moves all extremities 4. Cranial nerves II through XII grossly intact. Skin: Warm and dry. No rashes or lesions. Extremities: No clubbing or cyanosis. No edema.  Data Reviewed: I have personally reviewed following labs and imaging studies  CBC: Recent Labs  Lab 09/12/19 0535 09/12/19 0535 09/13/19 0424 09/14/19 0909 09/15/19 0530 09/16/19 0754 09/16/19 0936  WBC 10.7*  --  7.7 9.2 8.6 9.9  --   NEUTROABS 8.1*  --  5.7 7.0 6.2  --   --   HGB 10.6*   < > 8.9* 9.8* 8.9* 9.3* 8.5*  HCT 29.7*   < > 25.4* 27.6* 25.0* 26.0* 25.0*  MCV 80.5  --  80.9 81.2 82.0 81.8  --   PLT 252  --  200 230 212 219  --    < > = values in this interval not displayed.    Basic Metabolic Panel: Recent Labs  Lab 09/10/19 0448 09/11/19 [id number] 09/12/19 0535 09/12/19 0535 09/13/19 0424 09/14/19 [id number] 09/14/19 0909 09/15/19 0530 09/16/19 0754 09/16/19 0936  NA 137   < >  137   < > 138  --  136 137 136 140  K  3.5   < > 3.7   < > 3.7  --  3.8 3.8 3.8 3.7  CL 104   < > 107  --  107  --  105 106 106  --   CO2 23   < > 22  --  23  --  22 23 23   --   GLUCOSE 181*   < > 199*  --  160*  --  249* 142* 164*  --   BUN 16   < > 13  --  13  --  15 13 12   --   CREATININE 1.50*   < > 1.17  --  1.01  --  1.21 1.21 1.17  --   CALCIUM 8.6*   < > 8.7*  --  8.4*  --  8.7* 8.4* 8.4*  --   MG 1.5*  --  1.7  --  1.7 1.7  --  1.7  --   --   PHOS 3.2  --  2.7  --  3.3 3.7  --  3.7  --   --    < > = values in this interval not displayed.    GFR: Estimated Creatinine Clearance: 103.5 mL/min (by C-G formula based on SCr of 1.17 mg/dL).  Liver Function Tests: Recent Labs  Lab 09/10/19 0448 09/13/19 0424 09/14/19 0339 09/15/19 0530  AST  --  19 21 21   ALT  --  12 12 13   ALKPHOS  --  64 70 67  BILITOT  --  0.9 0.8 1.1  PROT  --  6.5 6.8 6.6  ALBUMIN 2.6* 2.3* 2.3* 2.4*    CBG: Recent Labs  Lab 09/15/19 1144 09/15/19 1636 09/15/19 2126 09/16/19 0753 09/16/19 1106  GLUCAP 197* 185* 154* 147* 118*     Recent Results (from the past 240 hour(s))  SARS CORONAVIRUS 2 (TAT 6-24 HRS) Nasopharyngeal Nasopharyngeal Swab     Status: None   Collection Time: 09/08/19  1:50 PM   Specimen: Nasopharyngeal Swab  Result Value Ref Range Status   SARS Coronavirus 2 NEGATIVE NEGATIVE Final    Comment: (NOTE) SARS-CoV-2 target nucleic acids are NOT DETECTED. The SARS-CoV-2 RNA is generally detectable in upper and lower respiratory specimens during the acute phase of infection. Negative results do not preclude SARS-CoV-2 infection, do not rule out co-infections with other pathogens, and should not be used as the sole basis for treatment or other patient management decisions. Negative results must be combined with clinical observations, patient history, and epidemiological information. The expected result is Negative. Fact Sheet for Patients: SugarRoll.be Fact Sheet for Healthcare  Providers: https://www.woods-mathews.com/ This test is not yet approved or cleared by the [place] FDA and  has been authorized for detection and/or diagnosis of SARS-CoV-2 by FDA under an Emergency Use Authorization (EUA). This EUA will remain  in effect (meaning this test can be used) for the duration of the COVID-19 declaration under Section 56 4(b)(1) of the Act, 21 U.S.C. section 360bbb-3(b)(1), unless the authorization is terminated or revoked sooner. Performed at [facility], [place] [address] [place], [place] [address]   Gastrointestinal Panel by PCR , Stool     Status: None   Collection Time: 09/09/19 12:21 PM   Specimen: Stool  Result Value Ref Range Status   Campylobacter species NOT DETECTED NOT DETECTED Final   Plesimonas shigelloides NOT DETECTED NOT DETECTED Final  Salmonella species NOT DETECTED NOT DETECTED Final   Yersinia enterocolitica NOT DETECTED NOT DETECTED Final   Vibrio species NOT DETECTED NOT DETECTED Final   Vibrio cholerae NOT DETECTED NOT DETECTED Final   Enteroaggregative E coli (EAEC) NOT DETECTED NOT DETECTED Final   Enteropathogenic E coli (EPEC) NOT DETECTED NOT DETECTED Final   Enterotoxigenic E coli (ETEC) NOT DETECTED NOT DETECTED Final   Shiga like toxin producing E coli (STEC) NOT DETECTED NOT DETECTED Final   Shigella/Enteroinvasive E coli (EIEC) NOT DETECTED NOT DETECTED Final   Cryptosporidium NOT DETECTED NOT DETECTED Final   Cyclospora cayetanensis NOT DETECTED NOT DETECTED Final   Entamoeba histolytica NOT DETECTED NOT DETECTED Final   Giardia lamblia NOT DETECTED NOT DETECTED Final   Adenovirus F40/41 NOT DETECTED NOT DETECTED Final   Astrovirus NOT DETECTED NOT DETECTED Final   Norovirus GI/GII NOT DETECTED NOT DETECTED Final   Rotavirus Chanetta Moosman NOT DETECTED NOT DETECTED Final   Sapovirus (I, II, IV, and V) NOT DETECTED NOT DETECTED Final    Comment: Performed at [facility], [address]  [place], [place] [address]  Culture, blood (Routine X 2) w Reflex to ID Panel     Status: Abnormal   Collection Time: 09/09/19  1:12 PM   Specimen: BLOOD  Result Value Ref Range Status   Specimen Description BLOOD RIGHT ANTECUBITAL  Final   Special Requests   Final    BOTTLES DRAWN AEROBIC AND ANAEROBIC Blood Culture adequate volume Performed at [facility] [address], [place], [place] [address]    Culture  Setup Time   Final    GRAM POSITIVE COCCI ANAEROBIC BOTTLE ONLY CRITICAL RESULT CALLED TO, READ BACK BY AND VERIFIED WITH: PHARMD SEAY LAURA AT 0546  BY MESSAN HOUEGNIFIO ON 09/10/2019     Culture STREPTOCOCCUS INFANTARIUS (Nikeisha Klutz)  Final   Report Status 09/12/2019 FINAL  Final   Organism ID, Bacteria STREPTOCOCCUS INFANTARIUS  Final      Susceptibility   Streptococcus infantarius - MIC*    PENICILLIN 0.25 INTERMEDIATE Intermediate     CEFTRIAXONE 0.25 SENSITIVE Sensitive     ERYTHROMYCIN <=0.12 SENSITIVE Sensitive     LEVOFLOXACIN 2 SENSITIVE Sensitive     VANCOMYCIN <=0.12 SENSITIVE Sensitive     * STREPTOCOCCUS INFANTARIUS  Culture, blood (Routine X 2) w Reflex to ID Panel     Status: Abnormal   Collection Time: 09/09/19  1:12 PM   Specimen: BLOOD LEFT HAND  Result Value Ref Range Status   Specimen Description BLOOD LEFT HAND  Final   Special Requests   Final    BOTTLES DRAWN AEROBIC AND ANAEROBIC Blood Culture adequate volume   Culture  Setup Time   Final    AEROBIC BOTTLE ONLY GRAM POSITIVE COCCI IN CHAINS GRAM POSITIVE COCCI IN CLUSTERS CRITICAL VALUE NOTED.  VALUE IS CONSISTENT WITH PREVIOUSLY REPORTED AND CALLED VALUE.    Culture (Lavere Stork)  Final    STREPTOCOCCUS INFANTARIUS SUSCEPTIBILITIES PERFORMED ON PREVIOUS CULTURE WITHIN THE LAST 5 DAYS. Performed at [facility], [place] [address] [place], [place] [address]    Report Status 09/12/2019 FINAL  Final  Blood Culture ID Panel (Reflexed)     Status: Abnormal   Collection Time: 09/09/19  1:12 PM  Result Value Ref  Range Status   Enterococcus species NOT DETECTED NOT DETECTED Final   Listeria monocytogenes NOT DETECTED NOT DETECTED Final   Staphylococcus species NOT DETECTED NOT DETECTED Final   Staphylococcus aureus (BCID) NOT  DETECTED NOT DETECTED Final   Streptococcus species DETECTED ([place]) NOT DETECTED Final    Comment: Not Enterococcus species, Streptococcus agalactiae, Streptococcus pyogenes, or Streptococcus pneumoniae. CRITICAL RESULT CALLED TO, READ BACK BY AND VERIFIED WITH: Criss Rosales PharmD 12:50 09/10/19 ([email or web address])    Streptococcus agalactiae NOT DETECTED NOT DETECTED Final   Streptococcus pneumoniae NOT DETECTED NOT DETECTED Final   Streptococcus pyogenes NOT DETECTED NOT DETECTED Final   Acinetobacter baumannii NOT DETECTED NOT DETECTED Final   Enterobacteriaceae species NOT DETECTED NOT DETECTED Final   Enterobacter cloacae complex NOT DETECTED NOT DETECTED Final   Escherichia coli NOT DETECTED NOT DETECTED Final   Klebsiella oxytoca NOT DETECTED NOT DETECTED Final   Klebsiella pneumoniae NOT DETECTED NOT DETECTED Final   Proteus species NOT DETECTED NOT DETECTED Final   Serratia marcescens NOT DETECTED NOT DETECTED Final   Haemophilus influenzae NOT DETECTED NOT DETECTED Final   Neisseria meningitidis NOT DETECTED NOT DETECTED Final   Pseudomonas aeruginosa NOT DETECTED NOT DETECTED Final   Candida albicans NOT DETECTED NOT DETECTED Final   Candida glabrata NOT DETECTED NOT DETECTED Final   Candida krusei NOT DETECTED NOT DETECTED Final   Candida parapsilosis NOT DETECTED NOT DETECTED Final   Candida tropicalis NOT DETECTED NOT DETECTED Final    Comment: Performed at [facility], [address] [place], [place] [address]  Culture, blood (routine x 2)     Status: None   Collection Time: 09/10/19  2:46 PM   Specimen: BLOOD RIGHT FOREARM  Result Value Ref Range Status   Specimen Description BLOOD RIGHT FOREARM  Final   Special Requests   Final    BOTTLES DRAWN AEROBIC AND  ANAEROBIC Blood Culture adequate volume   Culture   Final    NO GROWTH 5 DAYS Performed at [facility], [place] [address] [place], [place] [address]    Report Status 09/15/2019 FINAL  Final  Culture, blood (routine x 2)     Status: None   Collection Time: 09/10/19  2:51 PM   Specimen: BLOOD RIGHT WRIST  Result Value Ref Range Status   Specimen Description BLOOD RIGHT WRIST  Final   Special Requests   Final    BOTTLES DRAWN AEROBIC ONLY Blood Culture adequate volume   Culture   Final    NO GROWTH 5 DAYS Performed at [facility], [place] [address] [place], [place] [address]    Report Status 09/15/2019 FINAL  Final         Radiology Studies: CARDIAC CATHETERIZATION  Result Date: 09/16/2019  Ost RCA to Prox RCA lesion is 100% stenosed.  SVG to RVA with Origin lesion is 80% stenosed in stent.  Hemodynamic findings consistent with mild pulmonary hypertension.  1. Single vessel occlusive CAD with CTO of the ostium of the RCA 2. SVG to RCA with 80% in stent stenosis at the ostium. The RCA is Yaniel Limbaugh large vessel. 3. Mildly elevated LV filling pressures 4. Mild pulmonary HTN 5. Normal cardiac output. Plan: Redo AVR and CABG to RCA.   DG Foot Complete Left  Result Date: 09/15/2019 CLINICAL DATA:  Gout. EXAM: LEFT FOOT - COMPLETE 3+ VIEW COMPARISON:  None. FINDINGS: There is no evidence of fracture or dislocation. Very mild degenerative changes are seen involving the metatarsophalangeal joint of the left great toe. Soft tissues are unremarkable. IMPRESSION: Very mild degenerative changes without evidence of acute osseous abnormality. Electronically Signed   By: Virgina Norfolk M.D.   On: 09/15/2019 21:30  Scheduled Meds: . aspirin EC  81 mg Oral Daily  . atorvastatin  40 mg Oral Q breakfast  . colchicine  0.6 mg Oral BID  . gabapentin  300 mg Oral Q breakfast  . insulin aspart  0-15 Units Subcutaneous TID WC  . insulin aspart  0-5 Units Subcutaneous QHS  . insulin  aspart  6 Units Subcutaneous TID WC  . insulin detemir  6 Units Subcutaneous BID  . levothyroxine  50 mcg Oral Q0600  . metroNIDAZOLE  500 mg Oral Q8H  . mupirocin ointment  1 application Nasal BID  . polyethylene glycol  17 g Oral BID  . sodium chloride flush  3 mL Intravenous Once  . sodium chloride flush  3 mL Intravenous Q12H   Continuous Infusions: . sodium chloride    . cefTRIAXone (ROCEPHIN)  IV 2 g (09/16/19 1022)  . gentamicin 300 mg (09/15/19 1439)  . heparin       LOS: 8 days    Time spent: over 30 min    Fayrene Helper, MD [facility]   To contact the attending provider between 7A-7P or the covering provider during after hours 7P-7A, please log into the web site [email or web address] and access using universal [place] password for that web site. If you do not have the password, please call the hospital operator.  09/16/2019, 3:39 PM

## 2019-09-16 NOTE — Interval H&P Note (Signed)
History and Physical Interval Note:  09/16/2019 9:16 AM  [name]  has presented today for surgery, with the diagnosis of Coronary Artery Disease.  The various methods of treatment have been discussed with the patient and family. After consideration of risks, benefits and other options for treatment, the patient has consented to  Procedure(s): RIGHT HEART CATH AND CORONARY/GRAFT ANGIOGRAPHY (N/A) as a surgical intervention.  The patient's history has been reviewed, patient examined, no change in status, stable for surgery.  I have reviewed the patient's chart and labs.  Questions were answered to the patient's satisfaction.     Collier Salina [facility] 09/16/2019 9:16 AM

## 2019-09-16 NOTE — Progress Notes (Signed)
  T10 biopsy is on hold for now, it will not be done tomorrow  09/17/2019.  Osteocool with KP and poss Bx to be considered for 09/18/19  All of this has been discussed with Dr. Florene Glen, Dr. Trenton Gammon, and Dr. Karenann Cai.  Further orders will be placed accordingly

## 2019-09-17 ENCOUNTER — Encounter (HOSPITAL_COMMUNITY)
Admission: EM | Disposition: A | Discharge status: Home Health | Payer: Self-pay | Source: Home / Self Care | Attending: Surgery

## 2019-09-17 ENCOUNTER — Encounter (HOSPITAL_COMMUNITY): Payer: Self-pay | Admitting: Internal Medicine

## 2019-09-17 ENCOUNTER — Inpatient Hospital Stay (HOSPITAL_COMMUNITY): Payer: Medicare PPO | Admitting: Critical Care Medicine

## 2019-09-17 DIAGNOSIS — I361 Nonrheumatic tricuspid (valve) insufficiency: Secondary | ICD-10-CM | POA: Diagnosis not present

## 2019-09-17 DIAGNOSIS — R7881 Bacteremia: Secondary | ICD-10-CM | POA: Diagnosis not present

## 2019-09-17 DIAGNOSIS — E669 Obesity, unspecified: Secondary | ICD-10-CM

## 2019-09-17 DIAGNOSIS — R0989 Other specified symptoms and signs involving the circulatory and respiratory systems: Secondary | ICD-10-CM | POA: Diagnosis not present

## 2019-09-17 DIAGNOSIS — I079 Rheumatic tricuspid valve disease, unspecified: Secondary | ICD-10-CM | POA: Diagnosis not present

## 2019-09-17 DIAGNOSIS — I339 Acute and subacute endocarditis, unspecified: Secondary | ICD-10-CM | POA: Diagnosis not present

## 2019-09-17 DIAGNOSIS — I25810 Atherosclerosis of coronary artery bypass graft(s) without angina pectoris: Secondary | ICD-10-CM | POA: Diagnosis not present

## 2019-09-17 HISTORY — PX: MULTIPLE EXTRACTIONS WITH ALVEOLOPLASTY: SHX5342

## 2019-09-17 LAB — BASIC METABOLIC PANEL
Anion gap: 10 (ref 5–15)
BUN: 10 mg/dL (ref 6–20)
CO2: 20 mmol/L — ABNORMAL LOW (ref 22–32)
Calcium: 8.4 mg/dL — ABNORMAL LOW (ref 8.9–10.3)
Chloride: 108 mmol/L (ref 98–111)
Creatinine, Ser: 1.23 mg/dL (ref 0.61–1.24)
GFR calc Af Amer: >60 mL/min (ref >60)
GFR calc non Af Amer: >60 mL/min (ref >60)
Glucose, Bld: 143 mg/dL — ABNORMAL HIGH (ref 70–99)
Potassium: 4 mmol/L (ref 3.5–5.1)
Sodium: 138 mmol/L (ref 135–145)

## 2019-09-17 LAB — CBC WITH DIFFERENTIAL/PLATELET
Abs Immature Granulocytes: 0.07 10*3/uL (ref 0.00–0.07)
Basophils Absolute: 0 10*3/uL (ref 0.0–0.1)
Basophils Relative: 0 %
Eosinophils Absolute: 0.1 10*3/uL (ref 0.0–0.5)
Eosinophils Relative: 1 %
HCT: 26.5 % — ABNORMAL LOW (ref 39.0–52.0)
Hemoglobin: 9.2 g/dL — ABNORMAL LOW (ref 13.0–17.0)
Immature Granulocytes: 1 %
Lymphocytes Relative: 13 %
Lymphs Abs: 1.3 10*3/uL (ref 0.7–4.0)
MCH: 28.3 pg (ref 26.0–34.0)
MCHC: 34.7 g/dL (ref 30.0–36.0)
MCV: 81.5 fL (ref 80.0–100.0)
Monocytes Absolute: 0.9 10*3/uL (ref 0.1–1.0)
Monocytes Relative: 9 %
Neutro Abs: 7.8 10*3/uL — ABNORMAL HIGH (ref 1.7–7.7)
Neutrophils Relative %: 76 %
Platelets: 226 10*3/uL (ref 150–400)
RBC: 3.25 MIL/uL — ABNORMAL LOW (ref 4.22–5.81)
RDW: 14.6 % (ref 11.5–15.5)
WBC: 10.2 10*3/uL (ref 4.0–10.5)
nRBC: 0 % (ref 0.0–0.2)

## 2019-09-17 LAB — GLUCOSE, CAPILLARY
Glucose-Capillary: 161 mg/dL — ABNORMAL HIGH (ref 70–99)
Glucose-Capillary: 152 mg/dL — ABNORMAL HIGH (ref 70–99)
Glucose-Capillary: 220 mg/dL — ABNORMAL HIGH (ref 70–99)
Glucose-Capillary: 181 mg/dL — ABNORMAL HIGH (ref 70–99)
Glucose-Capillary: 186 mg/dL — ABNORMAL HIGH (ref 70–99)

## 2019-09-17 LAB — HEPATIC FUNCTION PANEL
ALT: 12 U/L (ref 0–44)
AST: 23 U/L (ref 15–41)
Albumin: 2.6 g/dL — ABNORMAL LOW (ref 3.5–5.0)
Alkaline Phosphatase: 67 U/L (ref 38–126)
Bilirubin, Direct: 0.3 mg/dL — ABNORMAL HIGH (ref 0.0–0.2)
Indirect Bilirubin: 0.7 mg/dL (ref 0.3–0.9)
Total Bilirubin: 1 mg/dL (ref 0.3–1.2)
Total Protein: 7.5 g/dL (ref 6.5–8.1)

## 2019-09-17 LAB — MAGNESIUM: Magnesium: 1.5 mg/dL — ABNORMAL LOW (ref 1.7–2.4)

## 2019-09-17 LAB — PHOSPHORUS: Phosphorus: 3.2 mg/dL (ref 2.5–4.6)

## 2019-09-17 LAB — PROTIME-INR
INR: 1.6 — ABNORMAL HIGH (ref 0.8–1.2)
Prothrombin Time: 18.9 seconds — ABNORMAL HIGH (ref 11.4–15.2)

## 2019-09-17 SURGERY — MULTIPLE EXTRACTION WITH ALVEOLOPLASTY
Anesthesia: General | Laterality: Bilateral

## 2019-09-17 MED ORDER — BUPIVACAINE-EPINEPHRINE 0.5% -1:200000 IJ SOLN
INTRAMUSCULAR | Status: DC | PRN
  Administered 2019-09-17 (×2): 1.7 mL

## 2019-09-17 MED ORDER — FENTANYL CITRATE (PF) 250 MCG/5ML IJ SOLN
INTRAMUSCULAR | Status: AC
  Filled 2019-09-17: qty 5

## 2019-09-17 MED ORDER — DEXAMETHASONE SODIUM PHOSPHATE 10 MG/ML IJ SOLN
INTRAMUSCULAR | Status: DC | PRN
  Administered 2019-09-17: 4 mg via INTRAVENOUS

## 2019-09-17 MED ORDER — ONDANSETRON HCL 4 MG/2ML IJ SOLN
INTRAMUSCULAR | Status: DC | PRN
  Administered 2019-09-17: 4 mg via INTRAVENOUS

## 2019-09-17 MED ORDER — LIDOCAINE 2% (20 MG/ML) 5 ML SYRINGE
INTRAMUSCULAR | Status: DC | PRN
  Administered 2019-09-17: 80 mg via INTRAVENOUS

## 2019-09-17 MED ORDER — PROMETHAZINE HCL 25 MG/ML IJ SOLN: 6.2500 mg | INTRAMUSCULAR | Status: DC | PRN

## 2019-09-17 MED ORDER — ACETAMINOPHEN 500 MG PO TABS
1000.0000 mg | ORAL_TABLET | Freq: Once | ORAL | Status: AC
  Administered 2019-09-17: 1000 mg via ORAL
  Filled 2019-09-17: qty 2

## 2019-09-17 MED ORDER — MIDAZOLAM HCL 5 MG/5ML IJ SOLN
INTRAMUSCULAR | Status: DC | PRN
  Administered 2019-09-17: 2 mg via INTRAVENOUS

## 2019-09-17 MED ORDER — HYDROCODONE-ACETAMINOPHEN 5-325 MG PO TABS: 1.0000 | ORAL_TABLET | ORAL | Status: DC | PRN

## 2019-09-17 MED ORDER — ROCURONIUM BROMIDE 10 MG/ML (PF) SYRINGE
PREFILLED_SYRINGE | INTRAVENOUS | Status: DC | PRN
  Administered 2019-09-17: 50 mg via INTRAVENOUS

## 2019-09-17 MED ORDER — OXYMETAZOLINE HCL 0.05 % NA SOLN
NASAL | Status: AC
  Filled 2019-09-17: qty 30

## 2019-09-17 MED ORDER — PROPOFOL 10 MG/ML IV BOLUS
INTRAVENOUS | Status: DC | PRN
  Administered 2019-09-17: 50 mg via INTRAVENOUS
  Administered 2019-09-17: 150 mg via INTRAVENOUS

## 2019-09-17 MED ORDER — PHENYLEPHRINE 40 MCG/ML (10ML) SYRINGE FOR IV PUSH (FOR BLOOD PRESSURE SUPPORT)
PREFILLED_SYRINGE | INTRAVENOUS | Status: DC | PRN
  Administered 2019-09-17 (×2): 80 ug via INTRAVENOUS

## 2019-09-17 MED ORDER — BOOST / RESOURCE BREEZE PO LIQD CUSTOM
1.0000 | Freq: Three times a day (TID) | ORAL | Status: DC
  Administered 2019-09-17 – 2019-09-22 (×11): 1 via ORAL
  Filled 2019-09-17: qty 1

## 2019-09-17 MED ORDER — FENTANYL CITRATE (PF) 250 MCG/5ML IJ SOLN
INTRAMUSCULAR | Status: DC | PRN
  Administered 2019-09-17: 50 ug via INTRAVENOUS
  Administered 2019-09-17: 25 ug via INTRAVENOUS
  Administered 2019-09-17: 100 ug via INTRAVENOUS
  Administered 2019-09-17 (×3): 50 ug via INTRAVENOUS

## 2019-09-17 MED ORDER — LACTATED RINGERS IV SOLN: INTRAVENOUS | Status: DC

## 2019-09-17 MED ORDER — THROMBIN 5000 UNITS EX KIT
PACK | CUTANEOUS | Status: DC | PRN
  Administered 2019-09-17: 5000 [IU] via TOPICAL

## 2019-09-17 MED ORDER — PRO-STAT SUGAR FREE PO LIQD
30.0000 mL | Freq: Two times a day (BID) | ORAL | Status: DC
  Administered 2019-09-17 – 2019-09-30 (×21): 30 mL via ORAL
  Filled 2019-09-17 (×21): qty 30

## 2019-09-17 MED ORDER — THROMBIN 5000 UNITS EX SOLR
CUTANEOUS | Status: AC
  Filled 2019-09-17: qty 5000

## 2019-09-17 MED ORDER — MIDAZOLAM HCL 2 MG/2ML IJ SOLN
INTRAMUSCULAR | Status: AC
  Filled 2019-09-17: qty 2

## 2019-09-17 MED ORDER — LACTATED RINGERS IV SOLN: INTRAVENOUS | Status: DC | PRN

## 2019-09-17 MED ORDER — PROPOFOL 10 MG/ML IV BOLUS
INTRAVENOUS | Status: AC
  Filled 2019-09-17: qty 20

## 2019-09-17 MED ORDER — SUGAMMADEX SODIUM 200 MG/2ML IV SOLN
INTRAVENOUS | Status: DC | PRN
  Administered 2019-09-17: 140 mg via INTRAVENOUS
  Administered 2019-09-17: 260 mg via INTRAVENOUS

## 2019-09-17 MED ORDER — FENTANYL CITRATE (PF) 100 MCG/2ML IJ SOLN: 25.0000 ug | INTRAMUSCULAR | Status: DC | PRN

## 2019-09-17 MED ORDER — HEPARIN (PORCINE) 25000 UT/250ML-% IV SOLN
1950.0000 [IU]/h | INTRAVENOUS | Status: DC
  Administered 2019-09-17 – 2019-09-18 (×3): 1950 [IU]/h via INTRAVENOUS
  Filled 2019-09-17 (×2): qty 250

## 2019-09-17 MED ORDER — AMINOCAPROIC ACID SOLUTION 5% (50 MG/ML)
ORAL | Status: DC | PRN
  Administered 2019-09-17: 20 mL via ORAL

## 2019-09-17 MED ORDER — AMINOCAPROIC ACID SOLUTION 5% (50 MG/ML)
10.0000 mL | ORAL | Status: AC
  Administered 2019-09-17 (×7): 10 mL via ORAL
  Filled 2019-09-17 (×4): qty 100

## 2019-09-17 MED ORDER — 0.9 % SODIUM CHLORIDE (POUR BTL) OPTIME
TOPICAL | Status: DC | PRN
  Administered 2019-09-17: 1000 mL

## 2019-09-17 MED ORDER — LIDOCAINE-EPINEPHRINE 2 %-1:100000 IJ SOLN
INTRAMUSCULAR | Status: DC | PRN
  Administered 2019-09-17 (×4): 1.7 mL via INTRADERMAL

## 2019-09-17 MED ORDER — MAGNESIUM SULFATE 2 GM/50ML IV SOLN
2.0000 g | Freq: Once | INTRAVENOUS | Status: AC
  Administered 2019-09-17: 21:00:00 2 g via INTRAVENOUS
  Filled 2019-09-17: qty 50

## 2019-09-17 MED ORDER — BUPIVACAINE-EPINEPHRINE (PF) 0.5% -1:200000 IJ SOLN
INTRAMUSCULAR | Status: AC
  Filled 2019-09-17: qty 3.6

## 2019-09-17 MED ORDER — LIDOCAINE-EPINEPHRINE 2 %-1:100000 IJ SOLN
INTRAMUSCULAR | Status: AC
  Filled 2019-09-17: qty 10.2

## 2019-09-17 MED ORDER — STERILE WATER FOR IRRIGATION IR SOLN
Status: DC | PRN
  Administered 2019-09-17: 1000 mL

## 2019-09-17 MED FILL — Verapamil HCl IV Soln 2.5 MG/ML: INTRAVENOUS | Qty: 2 | Status: AC

## 2019-09-17 SURGICAL SUPPLY — 40 items
ALCOHOL 70% 16 OZ (MISCELLANEOUS) ×2 IMPLANT
ATTRACTOMAT 16X20 MAGNETIC DRP (DRAPES) ×2 IMPLANT
BANDAGE HEMOSTAT MRDH 4X4 STRL (MISCELLANEOUS) IMPLANT
BLADE SURG 15 STRL LF DISP TIS (BLADE) ×2 IMPLANT
BLADE SURG 15 STRL SS (BLADE) ×4
BNDG HEMOSTAT MRDH 4X4 STRL (MISCELLANEOUS)
COVER SURGICAL LIGHT HANDLE (MISCELLANEOUS) ×2 IMPLANT
COVER WAND RF STERILE (DRAPES) ×1 IMPLANT
GAUZE 4X4 16PLY RFD (DISPOSABLE) ×2 IMPLANT
GAUZE PACKING FOLDED 2  STR (GAUZE/BANDAGES/DRESSINGS) ×2
GAUZE PACKING FOLDED 2 STR (GAUZE/BANDAGES/DRESSINGS) ×1 IMPLANT
GLOVE BIO SURGEON STRL SZ 6.5 (GLOVE) ×2 IMPLANT
GLOVE SURG ORTHO 8.0 STRL STRW (GLOVE) ×2 IMPLANT
GOWN STRL REUS W/ TWL LRG LVL3 (GOWN DISPOSABLE) ×1 IMPLANT
GOWN STRL REUS W/TWL 2XL LVL3 (GOWN DISPOSABLE) ×2 IMPLANT
GOWN STRL REUS W/TWL LRG LVL3 (GOWN DISPOSABLE) ×2
HEMOSTAT SURGICEL 2X14 (HEMOSTASIS) IMPLANT
KIT BASIN OR (CUSTOM PROCEDURE TRAY) ×2 IMPLANT
KIT TURNOVER KIT B (KITS) ×2 IMPLANT
MANIFOLD NEPTUNE II (INSTRUMENTS) ×2 IMPLANT
NDL BLUNT 16X1.5 OR ONLY (NEEDLE) IMPLANT
NDL DENTAL 27 LONG (NEEDLE) IMPLANT
NEEDLE BLUNT 16X1.5 OR ONLY (NEEDLE) IMPLANT
NEEDLE DENTAL 27 LONG (NEEDLE) ×2 IMPLANT
NS IRRIG 1000ML POUR BTL (IV SOLUTION) ×2 IMPLANT
PACK EENT II TURBAN DRAPE (CUSTOM PROCEDURE TRAY) ×2 IMPLANT
PAD ARMBOARD 7.5X6 YLW CONV (MISCELLANEOUS) ×2 IMPLANT
SPONGE SURGIFOAM ABS GEL 100 (HEMOSTASIS) IMPLANT
SPONGE SURGIFOAM ABS GEL 12-7 (HEMOSTASIS) IMPLANT
SPONGE SURGIFOAM ABS GEL SZ50 (HEMOSTASIS) ×1 IMPLANT
SUCTION FRAZIER HANDLE 10FR (MISCELLANEOUS) ×2
SUCTION TUBE FRAZIER 10FR DISP (MISCELLANEOUS) ×1 IMPLANT
SUT CHROMIC 3 0 PS 2 (SUTURE) ×5 IMPLANT
SUT CHROMIC 4 0 P 3 18 (SUTURE) IMPLANT
SYR 50ML SLIP (SYRINGE) ×2 IMPLANT
TOWEL GREEN STERILE (TOWEL DISPOSABLE) ×2 IMPLANT
TUBE CONNECTING 12X1/4 (SUCTIONS) ×2 IMPLANT
WATER STERILE IRR 1000ML POUR (IV SOLUTION) ×2 IMPLANT
WATER TABLETS ICX (MISCELLANEOUS) ×2 IMPLANT
YANKAUER SUCT BULB TIP NO VENT (SUCTIONS) ×2 IMPLANT

## 2019-09-17 NOTE — Op Note (Signed)
OPERATIVE REPORT  Patient:            [name] Date of Birth:  1965/12/20 MRN:                [id number]   DATE OF PROCEDURE:  09/17/2019  PREOPERATIVE DIAGNOSES: 1.  Tricuspid valve endocarditis 2.  Preheart valve surgery dental protocol 3.  Chronic apical periodontitis 4.  Retained root segments 5.  Dental caries 6.  Chronic periodontitis 7.  Accretions 8.  Loose teeth  POSTOPERATIVE DIAGNOSES: 1.  Tricuspid valve endocarditis 2.  Preheart valve surgery dental protocol 3.  Chronic apical periodontitis 4.  Retained root segments 5.  Dental caries 6.  Chronic periodontitis 7.  Accretions 8.  Loose teeth  OPERATIONS: 1. Multiple extraction of tooth numbers 1, 16, 19, 22, and 26. 2. 3 Quadrants of alveoloplasty 3. Gross debridement of remaining dentition   SURGEON: Lenn Cal, DDS  ASSISTANT: Molli Posey (dental assistant)  ANESTHESIA: General anesthesia via oral endotracheal tube.  MEDICATIONS: 1.  Rocephin and gentamicin IV per previous infectious disease orders. 2. Local anesthesia with a total utilization of 4 carpules each containing 34 mg of lidocaine with 0.017 mg of epinephrine as well as 2 carpules each containing 9 mg of bupivacaine with 0.009 mg of epinephrine.  SPECIMENS: There are 5 teeth that were discarded.  DRAINS: None  CULTURES: None  COMPLICATIONS: None  ESTIMATED BLOOD LOSS: 100 mLs.  INTRAVENOUS FLUIDS: 700 mLs of Lactated ringers solution.  INDICATIONS: The patient was recently diagnosed with tricuspid valve and aortic valve endocarditis.  Patient with anticipated heart valve surgery with Dr. Cyndia Bent.  A medically necessary dental consultation was then requested to evaluate poor dentition.  The patient was examined and treatment planned for multiple extraction of indicated teeth with the alveoloplasty and gross debridement of remaining dentition in the operating room with general anesthesia.  Tooth numbers 1 and 19  were initially identified for extraction.  However, during the examination in the operating room, tooth #'s 16, 22, 26 were found to have significant tooth mobility and need for extraction at this time.  These teeth were then added to the treatment plan for extraction to include tooth numbers 1, 16, 19, 22, and 26.  This treatment plan was formulated to decrease the risks and complications associated with dental infection from affecting the patient's systemic health and to prevent future endocarditis.  OPERATIVE FINDINGS: Patient was examined operating room number 9.  The teeth were identified for extraction as above.. The patient was noted be affected by chronic apical periodontitis, retained root segments, dental caries, chronic periodontitis, accretions, and tooth mobility.   DESCRIPTION OF PROCEDURE: Patient was brought to the main operating room number 9. Patient was then placed in the supine position on the operating table. General anesthesia was then induced per the anesthesia team. The patient was then prepped and draped in the usual manner for dental medicine procedure. A timeout was performed. The patient was identified and procedures were verified. A throat pack was placed at this time. The oral cavity was then thoroughly examined with the findings noted above. The patient was then ready for dental medicine procedure as follows:  Local anesthesia was then administered sequentially with a total utilization of 4 carpules each containing 34 mg of lidocaine with 0.017 mg of epinephrine as well as 2 carpules  each containing 9 mg bupivacaine with 0.009 mg of epinephrine.  The Maxillary left and right quadrants first approached. Anesthesia was then delivered utilizing  infiltration with lidocaine with epinephrine. A #15 blade incision was then made from the maxillary right tuberosity and extended to the distal of #4.  A surgical flap was then carefully reflected.  The retained roots in the area of #1  were then elevated out with a series of Cryer's elevators without complication.  Alveoloplasty was then performed utilizing rongeurs and bone file to help achieve primary closure.  The soft tissues were approximated and trimmed appropriately.  The surgical site was then irrigated with copious amounts of sterile saline.  A piece of Surgifoam impregnated with topical thrombin was then placed in the extraction site.  The surgical site was then closed from the maxillary right tuberosity and extended the distal #4 utilizing 3-0 Chromic Gut suture in a continuous erupted suture technique x1.    The maxillary left quadrant was then approached.  A 15 blade incision was made from the maxillary left tuberosity and extended to the distal #13.  A surgical flap was then carefully reflected.  Tooth #16 was then removed with a 53L forceps without complications.  Alveoloplasty was then performed utilizing a rongeurs and bone file to help achieve primary closure.  The surgical site was irrigated with copious amounts of sterile saline.  The tissues were approximated and trimmed appropriately.  A piece of Surgifoam impregnated with topical thrombin was then placed in the extraction socket.  The surgical site was then closed in the maxillary left tuberosity and extended the distal of #13 utilizing 3-0 Chromic Gut suture in a continuous erupted suture technique x1.    At this point time, the mandibular quadrants were approached. The patient was given bilateral inferior alveolar nerve blocks and long buccal nerve blocks utilizing the bupivacaine with epinephrine. Further infiltration was then achieved utilizing the lidocaine with epinephrine. A 15 blade incision was then made from the distal of number 17 and extended to the distal of #20.  A second 15 blade incision was then made in a sulcular fashion around tooth #22. Surgical flaps were then carefully reflected.  The retained roots in the area of tooth #19 were then elevated out  with a series of Cryer's elevator without complication.  Tooth #22 and tooth numbers 26 were then removed with a 151 forceps without complications.  The significant granulation tissue was removed and the extraction sites appropriately.  The surgical sites were then irrigated with copious amounts sterile saline.  A piece of Surgifoam impregnated with topical thrombin was placed in the extraction site of tooth #19, 22, and 26.  The mandibular left surgical site was then closed with distal #17 extended the distal #20 utilizing 3-0 Chromic Gut suture in a continuous erupted suture technique x1.  The surgical site area #22 was then closed from the mesial of #21 extended the distal of #23 utilizing 3-0 Chromic Gut suture in a continuous erupted suture technique x1.  The extraction site area #26 was then closed utilizing a figure-of-eight suture technique times 1 with 3-0 Chromic Gut material.    At this point time the gross debridement procedure was performed.  A sonic scaler was used to remove accretions.  A series of hand curettes were used to further remove accretions.  The sonic scaler was then again used to refine the removal of accretions.  This completed the gross debridement procedure.    At this point time, the entire mouth was irrigated with copious amounts of sterile saline. The patient was examined for complications, seeing none, the dental medicine procedure was deemed  to be complete. The throat pack was removed at this time. An oral airway was then placed at the request of the anesthesia team. A series of 4 x 4 gauze moistened with Amicar 5% rinse were placed in the mouth to aid hemostasis. The patient was then handed over to the anesthesia team for final disposition. After an appropriate amount of time, the patient was extubated and taken to the postanesthsia care unit in good condition. All counts were correct for the dental medicine procedure.  The patient is to continue the Amicar 5% rinses  postoperatively.  Patient is to rinse with 10 mL every hour for the next 10 hours in a swish and spit manner and then as needed for persistent oozing.  The heparin therapy is to be restarted at 2100 hrs. this evening by the pharmacy team at previous dose with no bolus.   Lenn Cal, DDS.

## 2019-09-17 NOTE — Progress Notes (Signed)
PRE-OPERATIVE NOTE:  09/17/2019 [name] OR:[id number]  VITALS: BP 139/90 (BP Location: Left Arm)   Pulse 92   Temp 99.4 F (37.4 C) (Oral)   Resp 20   Ht 6\' 2"  (1.88 m)   Wt 131.5 kg   SpO2 97%   BMI 37.22 kg/m   Lab Results  Component Value Date   WBC 10.2 09/17/2019   HGB 9.2 (L) 09/17/2019   HCT 26.5 (L) 09/17/2019   MCV 81.5 09/17/2019   PLT 226 09/17/2019   BMET    Component Value Date/Time   NA 138 09/17/2019 0400   K 4.0 09/17/2019 0400   CL 108 09/17/2019 0400   CO2 20 (L) 09/17/2019 0400   GLUCOSE 143 (H) 09/17/2019 0400   BUN 10 09/17/2019 0400   CREATININE 1.23 09/17/2019 0400   CREATININE 1.14 06/15/2016 0946   CALCIUM 8.4 (L) 09/17/2019 0400   GFRNONAA >60 09/17/2019 0400   GFRAA >60 09/17/2019 0400    Lab Results  Component Value Date   INR 1.6 (H) 09/17/2019   INR 1.7 (H) 09/16/2019   INR 2.0 (H) 09/15/2019   PROTIME 15.9 11/06/2008   PROTIME 15.6 10/23/2008   No results found for: PTT   Ivory Thorman presents for multiple dental extraction of indicated teeth with alveoloplasty and gross debridement of remaining dentition in the operating room with general anesthesia.  The patient is aware that he will be evaluated for additional extractions upon a more thorough examination in the operating room under general anesthesia.   SUBJECTIVE: The patient denies any acute medical or dental changes and agrees to proceed with treatment as planned.  EXAM: No sign of acute dental changes.  ASSESSMENT: Patient is affected by chronic apical periodontitis, retained root segments, dental caries, chronic periodontitis, and accretions.  PLAN: Patient agrees to proceed with treatment as planned in the operating room as previously discussed and accepts the risks, benefits, and complications of the proposed treatment. Patient is aware of the risk for bleeding, bruising, swelling, infection, pain, nerve damage, soft tissue damage, damage to adjacent  teeth, sinus involvement, root tip fracture, mandible fracture, and the risks of complications associated with the anesthesia. Patient also is aware of the potential for other complications up to and including death due to his overall cardiovascular and respiratory compromise.     Lenn Cal, DDS

## 2019-09-17 NOTE — Anesthesia Procedure Notes (Signed)
Procedure Name: Intubation Date/Time: 09/17/2019 7:28 AM Performed by: Wilburn Cornelia, CRNA Pre-anesthesia Checklist: Patient identified, Emergency Drugs available, Suction available, Patient being monitored and Timeout performed Patient Re-evaluated:Patient Re-evaluated prior to induction Oxygen Delivery Method: Circle system utilized Preoxygenation: Pre-oxygenation with 100% oxygen Induction Type: IV induction Ventilation: Mask ventilation without difficulty Laryngoscope Size: Mac and 4 Grade View: Grade II Tube type: Oral Tube size: 7.5 mm Number of attempts: 1 Airway Equipment and Method: Stylet Placement Confirmation: ETT inserted through vocal cords under direct vision,  positive ETCO2,  CO2 detector and breath sounds checked- equal and bilateral Secured at: 23 cm Tube secured with: Tape Dental Injury: Teeth and Oropharynx as per pre-operative assessment

## 2019-09-17 NOTE — Progress Notes (Signed)
Initial Nutrition Assessment  DOCUMENTATION CODES:   Obesity unspecified  INTERVENTION:   Boost Breeze po TID, each supplement provides 250 kcal and 9 grams of protein.  Pro-stat 30 ml PO BID, each supplement provides 100 kcal and 15 gm protein.  When diet advanced past clear liquids, can change PO supplement to Ensure Enlive TID, each supplement provides 350 kcal and 20 grams of protein.  NUTRITION DIAGNOSIS:   Increased nutrient needs related to acute illness(infectious endocarditis, buttocks abscess, upcoming surgery planned) as evidenced by estimated needs.  GOAL:   Patient will meet greater than or equal to 90% of their needs  MONITOR:   PO intake, Supplement acceptance, Diet advancement, Labs  REASON FOR ASSESSMENT:   Consult Assessment of nutrition requirement/status(multiple dental extractions)  ASSESSMENT:   54 yo male admitted with N/V/D, DOE. Found to have infectious endocarditis. Also found to have a buttocks abscess and vertebral lesion. PMH includes HTN, HLD, PAF, TIA, DM, CAD s/p CABG 2000.   L buttock abscess spontaneously drained.   Plans for T10 biopsy in the near future.  S/P 5 dental extractions 4/27.  Plans for redo bypass to the RCA 4/28.  Spoke with patient's wife at bedside. She reports that patient usually has a good appetite and eats well. His weight has been stable PTA. Patient has been on a heart healthy diet since admission, consuming 50-100% of meals. Currently on clear liquids s/p dental extractions this morning.   Labs reviewed. Mag 1.5 (L) CBG's: 161-152-220  Medications reviewed and include novolog, levemir, miralax.  Usual weights reviewed; no significant weight changes noted recently.  NUTRITION - FOCUSED PHYSICAL EXAM:    Most Recent Value  Orbital Region  Unable to assess  Upper Arm Region  No depletion  Thoracic and Lumbar Region  No depletion  Buccal Region  Unable to assess  Temple Region  No depletion  Clavicle Bone  Region  No depletion  Clavicle and Acromion Bone Region  No depletion  Scapular Bone Region  Unable to assess  Dorsal Hand  No depletion  Patellar Region  No depletion  Anterior Thigh Region  No depletion  Posterior Calf Region  No depletion  Edema (RD Assessment)  Mild  Hair  Reviewed  Eyes  Reviewed  Mouth  Unable to assess  Skin  Reviewed  Nails  Reviewed       Diet Order:   Diet Order            Diet NPO time specified Except for: Sips with Meds  Diet effective midnight        Diet clear liquid Room service appropriate? No; Fluid consistency: Thin  Diet effective now              EDUCATION NEEDS:   Not appropriate for education at this time  Skin:  Skin Assessment: Skin Integrity Issues: Skin Integrity Issues:: Other (Comment) Other: abscess to L buttocks  Last BM:  4/27  Height:   Ht Readings from Last 1 Encounters:  09/08/19 6\' 2"  (1.88 m)    Weight:   Wt Readings from Last 1 Encounters:  09/17/19 131.5 kg    Ideal Body Weight:  86.4 kg  BMI:  Body mass index is 37.22 kg/m.  Estimated Nutritional Needs:   Kcal:  2400-2600  Protein:  130-150 gm  Fluid:  >/= 2.4 L    Molli Barrows, RD, LDN, CNSC Please refer to Amion for contact information.

## 2019-09-17 NOTE — Progress Notes (Signed)
I was asked by interventional radiology with regard to the feasibility of performing subsequent vertebroplasty after vertebral biopsy.  Upon reflection, given the fact the patient has intermittent bacteremia from his likely endocarditis I would recommend against any type of vertebral augmentation and just move forward with a biopsy alone.

## 2019-09-17 NOTE — Anesthesia Postprocedure Evaluation (Signed)
Anesthesia Post Note  Patient: [name]  Procedure(s) Performed: EXTRACTION OF TOOTH #'S [phone] WITH ALVEOLOPLASTY AND GROSS DEBRIDEMENT OF REMAINING TEETH (Bilateral )     Patient location during evaluation: PACU Anesthesia Type: General Level of consciousness: awake and alert Pain management: pain level controlled Vital Signs Assessment: post-procedure vital signs reviewed and stable Respiratory status: spontaneous breathing, nonlabored ventilation, respiratory function stable and patient connected to nasal cannula oxygen Cardiovascular status: blood pressure returned to baseline and stable Postop Assessment: no apparent nausea or vomiting Anesthetic complications: no    Last Vitals:  Vitals:   09/17/19 1200 09/17/19 1344  BP:  130/89  Pulse: 83   Resp:  16  Temp:  37 C  SpO2: 99%     Last Pain:  Vitals:   09/17/19 1344  TempSrc: Oral  PainSc:                  Tiajuana Amass

## 2019-09-17 NOTE — Progress Notes (Signed)
Progress Note  Patient Name: [name] Date of Encounter: 09/17/2019  Primary Cardiologist: Kirk Ruths, MD   Subjective   He has had his teeth extraction, no bleeding. Feels well  Inpatient Medications    Scheduled Meds: . aminocaproic acid  10 mL Oral Q1H  . aspirin EC  81 mg Oral Daily  . atorvastatin  40 mg Oral Q breakfast  . colchicine  0.6 mg Oral BID  . gabapentin  300 mg Oral Q breakfast  . insulin aspart  0-15 Units Subcutaneous TID WC  . insulin aspart  0-5 Units Subcutaneous QHS  . insulin aspart  6 Units Subcutaneous TID WC  . insulin detemir  6 Units Subcutaneous BID  . levothyroxine  50 mcg Oral Q0600  . metroNIDAZOLE  500 mg Oral Q8H  . mupirocin ointment  1 application Nasal BID  . polyethylene glycol  17 g Oral BID  . sodium chloride flush  3 mL Intravenous Once  . sodium chloride flush  3 mL Intravenous Q12H   Continuous Infusions: . sodium chloride    . cefTRIAXone (ROCEPHIN)  IV 2 g (09/17/19 1010)  . gentamicin 300 mg (09/16/19 2032)  . lactated ringers     PRN Meds: sodium chloride, acetaminophen **OR** acetaminophen, HYDROcodone-acetaminophen, nitroGLYCERIN, ondansetron **OR** ondansetron (ZOFRAN) IV, sodium chloride flush   Vital Signs    Vitals:   09/17/19 0900 09/17/19 0915 09/17/19 0948 09/17/19 1100  BP: 135/82 135/82 133/79   Pulse: 92 92 90 89  Resp: (!) '22 20 18   '$ Temp: (!) 97.2 F (36.2 C) 97.7 F (36.5 C)    TempSrc:      SpO2: 92% 96% 98% 98%  Weight:      Height:        Intake/Output Summary (Last 24 hours) at 09/17/2019 1201 Last data filed at 09/17/2019 0855 Gross per 24 hour  Intake 1627.38 ml  Output 1060 ml  Net 567.38 ml   Last 3 Weights 09/17/2019 09/16/2019 09/15/2019  Weight (lbs) 289 lb 14.4 oz 287 lb 3.2 oz 285 lb 9.6 oz  Weight (kg) 131.498 kg 130.273 kg 129.547 kg      Telemetry    NSR - Personally Reviewed  ECG    SR with PR interval 240 ms - Personally Reviewed  Physical Exam  Obese.  Teeth extraction sites without active bleeding GEN: No acute distress.   Neck: No JVD Cardiac: RRR, crisp prosthetic valve clicks, no diastolic murmurs, rubs, or gallops.  Respiratory: Clear to auscultation bilaterally. GI: Soft, nontender, non-distended  MS: No edema; No deformity. Neuro:  Nonfocal  Psych: Normal affect   Labs    High Sensitivity Troponin:   Recent Labs  Lab 09/08/19 0810 09/08/19 1100  TROPONINIHS 34* 28*      Chemistry Recent Labs  Lab 09/14/19 0339 09/14/19 0909 09/15/19 0530 09/15/19 0530 09/16/19 0754 09/16/19 0936 09/17/19 0400  NA  --    < > 137   < > 136 139  140 138  K  --    < > 3.8   < > 3.8 3.7  3.7 4.0  CL  --    < > 106  --  106  --  108  CO2  --    < > 23  --  23  --  20*  GLUCOSE  --    < > 142*  --  164*  --  143*  BUN  --    < > 13  --  12  --  10  CREATININE  --    < > 1.21  --  1.17  --  1.23  CALCIUM  --    < > 8.4*  --  8.4*  --  8.4*  PROT 6.8  --  6.6  --   --   --  7.5  ALBUMIN 2.3*  --  2.4*  --   --   --  2.6*  AST 21  --  21  --   --   --  23  ALT 12  --  13  --   --   --  12  ALKPHOS 70  --  67  --   --   --  67  BILITOT 0.8  --  1.1  --   --   --  1.0  GFRNONAA  --    < > >60  --  >60  --  >60  GFRAA  --    < > >60  --  >60  --  >60  ANIONGAP  --    < > 8  --  7  --  10   < > = values in this interval not displayed.     Hematology Recent Labs  Lab 09/15/19 0530 09/15/19 0530 09/16/19 0754 09/16/19 0936 09/17/19 0400  WBC 8.6  --  9.9  --  10.2  RBC 3.05*  --  3.18*  --  3.25*  HGB 8.9*   < > 9.3* 8.2*  8.5* 9.2*  HCT 25.0*   < > 26.0* 24.0*  25.0* 26.5*  MCV 82.0  --  81.8  --  81.5  MCH 29.2  --  29.2  --  28.3  MCHC 35.6  --  35.8  --  34.7  RDW 14.4  --  14.2  --  14.6  PLT 212  --  219  --  226   < > = values in this interval not displayed.    BNPNo results for input(s): BNP, PROBNP in the last 168 hours.   DDimer No results for input(s): DDIMER in the last 168 hours.   Radiology      CARDIAC CATHETERIZATION  Result Date: 09/16/2019  Ost RCA to Prox RCA lesion is 100% stenosed.  SVG to RVA with Origin lesion is 80% stenosed in stent.  Hemodynamic findings consistent with mild pulmonary hypertension.  1. Single vessel occlusive CAD with CTO of the ostium of the RCA 2. SVG to RCA with 80% in stent stenosis at the ostium. The RCA is a large vessel. 3. Mildly elevated LV filling pressures 4. Mild pulmonary HTN 5. Normal cardiac output. Plan: Redo AVR and CABG to RCA.   DG Foot Complete Left  Result Date: 09/15/2019 CLINICAL DATA:  Gout. EXAM: LEFT FOOT - COMPLETE 3+ VIEW COMPARISON:  None. FINDINGS: There is no evidence of fracture or dislocation. Very mild degenerative changes are seen involving the metatarsophalangeal joint of the left great toe. Soft tissues are unremarkable. IMPRESSION: Very mild degenerative changes without evidence of acute osseous abnormality. Electronically Signed   By: Virgina Norfolk M.D.   On: 09/15/2019 21:30    Cardiac Studies   CATH today  Ost RCA to Prox RCA lesion is 100% stenosed.  SVG to RVA with Origin lesion is 80% stenosed in stent.  Hemodynamic findings consistent with mild pulmonary hypertension.  1. Single vessel occlusive CAD with CTO of the ostium of the RCA 2. SVG to RCA with 80% in  stent stenosis at the ostium. The RCA is a large vessel. 3. Mildly elevated LV filling pressures 4. Mild pulmonary HTN 5. Normal cardiac output.   Plan: Redo AVR and CABG to RCA.   TEE 09/11/2019  1. Left ventricular ejection fraction, by estimation, is 55 to 60%. The  left ventricle has normal function. The left ventricle has no regional  wall motion abnormalities. There is mild concentric left ventricular  hypertrophy.  2. Right ventricular systolic function is normal. The right ventricular  size is mildly enlarged. There is mildly elevated pulmonary artery  systolic pressure. The estimated right ventricular systolic pressure is   44.9 mmHg.  3. It appears that the left atrial appendage has been surgically resected  or oversown. Left atrial size was severely dilated. No left atrial/left  atrial appendage thrombus was detected. The LAA emptying velocity was 35  cm/s.  4. Right atrial size was severely dilated.  5. The mitral valve is normal in structure. Mild mitral valve  regurgitation.  6. There is a large, moderately mobile vegetation attached to the septal  leaflet of the tricuspid valve, measuring 27 mm in length and 11 mm in  width. It appears heterogeneous and friable.. Tricuspid valve  regurgitation is moderate.  7. There is a large vegetation attached to the medial aspect of posterior  disc or the posteromedial mechanical valve annulus. It is highly mobile  and prolapses readily across the valve orifice. It measures 15 mm in  length and 6 mm in width. Although  there is no evidence of a periannular abscess cavity, the periannular  tissue echodensity is heterogeneous, raining concern for  inflammation/early abscess formation. The proximity of the aortic and the  tricuspid vegetation to the same area of the  interventricular septum also raises concern for intramyocardial abscess or  fistula formation. The aortic valve has been repaired/replaced. Aortic  valve regurgitation is trivial. Mild aortic valve stenosis. There is a  unknown [employer] tilting disk valve  present in the aortic position. Procedure Date: 2000. Aortic valve mean  gradient measures 20.0 mmHg.  8. There is mild (Grade II) atheroma plaque involving the transverse  aorta.   Patient Profile     54 y.o. 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  Assessment & Plan    1. Endocarditis:  redo aortic valve replacement, probably with cadaveric homograft, tentatively scheduled for April 29. On antibiotics IV. Afebrile, WBC normal, follow-up blood cultures from 04 20/2021 are negative to date. ESR 124 (09/10/2019), will repeat in AM. Warfarin has been discontinued and he is on IV heparin, INR 2.0 today.  No clinical signs of AI or CHF, but PR 240 ms is concerning for periannular abscess. ESR still 122.  2. CAD: He will have redo bypass to the RCA, no other stenotic vessels  3. PAFib: without recent recurrence, on IV heparin.  4. T10 lytic lesion: Dr. Marchelle Folks note reviewed. Current plans for his biopsy for 4/28, but this would prevent [place] from getting the pathology results in time for his cardiac surgery. Will ask IR if they might still be able to do it today. Diff dx includesvertebral osteomyelitis versus plasmacytoma versus metastatic lesion.  5. DM: Increased  risk of infection/healing problems. Until recently glycemic control was excellent with hemoglobin A1c in the 6-7% range, on current admission up to 8.2%, possibly due to active infection.      For questions or updates, please contact [place] Please consult [email or web address] for contact info under        Signed, Sanda Klein, MD  09/17/2019, 12:01 PM

## 2019-09-17 NOTE — Progress Notes (Addendum)
[place] for warfarin>>heparin Indication: atrial fibrillation + mechanical AVR  Allergies  Allergen Reactions  . Diltiazem Hives  . Insulin Glargine Swelling    edema    Patient Measurements: Height: 6\' 2"  (188 cm) Weight: 131.5 kg (289 lb 14.4 oz) IBW/kg (Calculated) : 82.2  Vital Signs: Temp: 98.6 F (37 C) (04/27 1344) Temp Source: Oral (04/27 1344) BP: 130/89 (04/27 1344) Pulse Rate: 83 (04/27 1200)  Labs: Recent Labs    09/15/19 0530 09/15/19 0530 09/15/19 1414 09/16/19 0302 09/16/19 0754 09/16/19 0754 09/16/19 0936 09/17/19 0400  HGB 8.9*   < >  --   --  9.3*   < > 8.2*  8.5* 9.2*  HCT 25.0*   < >  --   --  26.0*  --  24.0*  25.0* 26.5*  PLT 212  --   --   --  219  --   --  226  LABPROT 22.7*  --   --  20.3*  --   --   --  18.9*  INR 2.0*  --   --  1.7*  --   --   --  1.6*  HEPARINUNFRC 0.28*  --  0.35 0.38  --   --   --   --   CREATININE 1.21  --   --   --  1.17  --   --  1.23   < > = values in this interval not displayed.    Estimated Creatinine Clearance: 99 mL/min (by C-G formula based on SCr of 1.23 mg/dL).   Medical History: Past Medical History:  Diagnosis Date  . Ascending aortic dissection ([place])    a. 2000 - with aortic valve involvement. S/p repair of aortic dissection with placement of mechanical AVR.  [name] CAD (coronary artery disease)    a. 2000 VG->PDA @ time of Ao dissection repair;  b. Cardiac CT 06/2010: no CAD, only mild plaque in prox RCA;  c. 2015 Cath: LM nl, LAD nl, LCX nl, RCA 100, VG->PDA 90 (4.5x12 Rebel BMS). d. 10/23/17 instent restenosis SVG to RCA, DES placed.  . Cholelithiasis 08/22/2013  . Chronic diastolic CHF (congestive heart failure) ([place])    a. 03/2016 Echo: EF 55-60%, no rwma, Gr2 DD.  . Diabetes mellitus   . Dyspnea   . Gross hematuria   . H/O mechanical aortic valve replacement    a. 03/2016 Echo: AoV mean gradient 10mmHg, Valve Area (VTI) 1.36 cm^2, (Vmax) 1.22 cm^2, mod dil  LA.  [name] Hyperlipidemia   . Hypertension   . NEPHROLITHIASIS, HX OF   . Obesity   . PAF (paroxysmal atrial fibrillation) ([place])    a. H/o such, with recurrence of coarse afib/flutter during ER consult 03/2012.  . Stroke ([place])   . TRANSIENT ISCHEMIC ATTACK, HX OF    a. In 2004.  [name] Unspecified vitamin D deficiency    Assessment: 60 YOM presenting with SOB and N/V, on warfarin PTA for Afib and mechanical AVR. Vegetation seen on TTE, will need to hold warfarin and begin IV heparin with impending need for procedures. He is s/p cath 4/26 with plans for CABG and redo AVR.    Now s/p teeth extraction and heparin to restart at 9pm (no bleeding noted) . Plans noted for biopsy on 4/29   PTA dosing: 5mg  MWF, 7.5mg  all other days    Goal of Therapy:  Heparin level 0.3-0.7 units/mL INR 2.5-3.5 Monitor platelets by anticoagulation protocol: Yes  Plan:  Restart heparin at 9pm at 1950 units/hr -Heparin level daily wth CBC daily  Hildred Laser, PharmD Clinical Pharmacist **Pharmacist phone directory can now be found on [email or web address] ([email or web address]).  Listed under [place].   **Pharmacist phone directory can now be found on [email or web address] ([email or web address]).  Listed under [place].

## 2019-09-17 NOTE — Progress Notes (Signed)
PROGRESS NOTE    [name]  [id number] DOB: 1965/11/15 DOA: 09/08/2019 PCP: Biagio Borg, MD    Chief Complaint  Patient presents with  . Shortness of Breath  . Emesis   Brief Narrative:  54 y.o.malewith history of CAD s/p CABG in 2000, SVG-RCA PCI with BMS in Feb 2015, he had re-do PCI in June 2019 DES to SVG graft, ascending aortic dissection in 2000 s/p repair with AVR on warfarin.  Paroxysmal Esperansa [name]. fib.  NIDDM-2, HTN and HLD presenting with 5 days of chills, nausea, vomiting, diarrhea and dyspnea on exertion and orthopnea mainly with lying on the left side.  He's been found to have infective endocarditis with strep infantarius bacteremia.  He had Emmilyn Crooke TEE showing large vegetations on the septal leaflet of the tricuspid valve and on the posterior disc of the aortic valve.  He's on ceftriaxone/gentamicin for his endocarditis at this point.  ID and cardiology are following.  He was seen by dental medicine 4/27 for multiple tooth extraction, alveoloplasty, debridement.  He's also been seen by surgery for Shineka Auble left buttocks abscess which has spontaneously drained and by orthopedics for L MTP pain and swelling which is thought to be gout.  He had an incidental aggressive appearing lesion in T10 which will be biopsied on 4/28 by IR, neurosurgery is also following for this lesion.  Dr. Cyndia Bent from [facility] planning for surgery on Monday 5/3.  Assessment & Plan:   Principal Problem:   Suspected endocarditis mechanical aortic valve Active Problems:   Diabetes mellitus type II, non insulin dependent (HCC)   Hyperlipidemia   Essential hypertension   PAF (paroxysmal atrial fibrillation) (HCC)   Hypothyroidism   Normocytic anemia   Shortness of breath   Obesity (BMI 30-39.9)   CAD (coronary artery disease)   Nausea vomiting and diarrhea   Gram-positive cocci bacteremia   Endocarditis of tricuspid valve  Infective endocarditis  Strep Infantarius Bacteremia: Patient with fever and  chills.  Echocardiogram with tricuspid valve vegetation and regurgitation.   - blood cx 4/19 strep infantarius intermediate to penicillin - blood cx 4/20 cx NGTD - ID consulted, appreciate recommendations - continue ceftriaxone/gentamicin - consider colonoscopy prior to surgery for ? Polyps per ID - TEE with large vegetations on septal leaflet of the tricuspid valve and on the posterior disc of the mechanical aortic valve (see report) - Repeat blood culture 4/20 NGTD - ok for PICC - orthopantogram with dental caries and periodontal disease 4/21 - CT surgery c/s, planning for surgery in middle to end of next week (depending on prior to surgery eval/treatment) - 4/27 s/p multiple tooth extraction, alveoloplasty, debridement by dentistry - cardiac catheterization 4/26 -> with single vessel occlusive CAD with CTO of the ostium of the RCA, SVG to RCA with 80% in stent stenosis at the ostium, mildly elevated LV filling pressures, mild pulm HTN -> plan for redo AVR and CABG to RCA - IR planning for bx of T10 vertebral lesion on 4/28  - CT angio chest abdomen pelvis   Left Buttocks Abscess: spontaneously drained.  Appreciate surgery's assistance.  Ok to remove packing and cover with pad for drainage.  Surgery added flagyl.  Appears to be healing well.  CTM.  CCS will follow if needed  Left MTP Pain  Concern for Gout: in setting of above, will c/s orthopedics.  Started on colchicine for gout.  Uric acid 7.4 Continue colchicine Orthopedics c/s, no recommendation for additional diagnostic tests at this time  Aggressive appearing  lesion in T10 vertebral body: plasmacytoma vs osteoblastoma vs solitary metastatic lesion.   Planning for bone bx by IR 4/28 Appreciate neurosurgery recommendations  Nausea/vomiting/diarrhea: Could be due to the above.  GIP negative.  GI symptoms resolved. -Continue symptomatic care -Discontinue enteric precautions  Iron Def Anemia: hb relatively stable, continue to  follow Follow B12 (low normal, MMA pending), folate (wnl) Start iron supplementation  Dyspnea on exertion/orthopnea:echo with EF of 55 to 60%, severe RAE, moderate LAE, 1.8 x 1.1 cm tricuspid valve mass on the atrial surface consistent with vegetation and moderate TVR.  No signs of fluid overload.  BNP slightly elevated but no vascular congestion on CXR.  Has history of asthma but not wheezing or coughing.  INR therapeutic to think of PE.  Overall, respiratory symptoms improved. -Hold home Lasix in the setting of AKI.  CAD s/p CABG in 2000, SVG-RCA PCI with BMS in Feb 2015, he had re-do PCI in June 2019 DES to SVG graft: Echocardiogram as above. -Continue home ASA and statin.   -Plavix on hold in the setting of #1.  Last dose on 4/19 -Metoprolol, losartan and Lasix on hold due to AKI and soft blood pressures  Elevated troponin: Likely demand ischemia in the setting of the above.  No exertional chest pain. -Cardiac meds as above  Ascending aortic dissection in 2000 s/p repair with Mechanical AVR on warfarin.   - heparin gtt for INR <2.5, started 4/24 - will restart tonight after his dental procedure -Monitor fluid status and renal function.  Paroxysmal [name]. fib.  Rate controlled on metoprolol. -Metoprolol on hold per cardiology. -Anticoagulation as above.  Uncontrolled NIDDM-2 with hyperglycemia: A1c 8.2%. -Continue SSI-moderate -Start Levemir 6 units twice daily and NovoLog 6 units AC -Continue statin.  Check lipid panel  Acute kidney injury: Likely due to hypotension and nephrotoxic meds (vancomycin, Lasix and losartan) -Holding metoprolol, Lasix and losartan -Continue monitoring  Essential HTN:  -metoprolol, amlodipine and losartan on hold.  BP's reasonable  HLD  -Check lipid panel -Continue atorvastatin.  Hypothyroidism: -Continue home Synthroid.  At risk for sleep apnea: STOP BANG score 6-7 which puts him at high risk for sleep apnea. -Needs outpatient sleep  study.  Morbid obesity: Body mass index is 36.03 kg/m.  With comorbidities as above. -Encourage lifestyle change to lose weight.  DVT prophylaxis: heparin to start when INR decreases < 2.5 Code Status: full  Family Communication: wife at bedside Disposition:   Status is: Inpatient  Remains inpatient appropriate because:Inpatient level of care appropriate due to severity of illness   Dispo: The patient is from: Home              Anticipated d/c is to: pending              Anticipated d/c date is: > 3 days              Patient currently is not medically stable to d/c.   Consultants:   ID  Cardiology  CT surgery  Procedures:  Cardiac Cath  Ost RCA to Prox RCA lesion is 100% stenosed.  SVG to RVA with Origin lesion is 80% stenosed in stent.  Hemodynamic findings consistent with mild pulmonary hypertension.   1. Single vessel occlusive CAD with CTO of the ostium of the RCA 2. SVG to RCA with 80% in stent stenosis at the ostium. The RCA is Kimmy Totten large vessel. 3. Mildly elevated LV filling pressures 4. Mild pulmonary HTN 5. Normal cardiac output.   Plan:  Redo AVR and CABG to RCA.   Transesophageal Echo IMPRESSIONS    1. Left ventricular ejection fraction, by estimation, is 55 to 60%. The  left ventricle has normal function. The left ventricle has no regional  wall motion abnormalities. There is mild concentric left ventricular  hypertrophy.  2. Right ventricular systolic function is normal. The right ventricular  size is mildly enlarged. There is mildly elevated pulmonary artery  systolic pressure. The estimated right ventricular systolic pressure is  [id number] mmHg.  3. It appears that the left atrial appendage has been surgically resected  or oversown. Left atrial size was severely dilated. No left atrial/left  atrial appendage thrombus was detected. The LAA emptying velocity was 35  cm/s.  4. Right atrial size was severely dilated.  5. The mitral valve is  normal in structure. Mild mitral valve  regurgitation.  6. There is Joylynn Defrancesco large, moderately mobile vegetation attached to the septal  leaflet of the tricuspid valve, measuring 27 mm in length and 11 mm in  width. It appears heterogeneous and friable.. Tricuspid valve  regurgitation is moderate.  7. There is Verland Sprinkle large vegetation attached to the medial aspect of posterior  disc or the posteromedial mechanical valve annulus. It is highly mobile  and prolapses readily across the valve orifice. It measures 15 mm in  length and 6 mm in width. Although  there is no evidence of Tylynn Braniff periannular abscess cavity, the periannular  tissue echodensity is heterogeneous, raining concern for  inflammation/early abscess formation. The proximity of the aortic and the  tricuspid vegetation to the same area of the  interventricular septum also raises concern for intramyocardial abscess or  fistula formation. The aortic valve has been repaired/replaced. Aortic  valve regurgitation is trivial. Mild aortic valve stenosis. There is Lolly Glaus  unknown [employer] tilting disk valve  present in the aortic position. Procedure Date: 2000. Aortic valve mean  gradient measures 20.0 mmHg.  8. There is mild (Grade II) atheroma plaque involving the transverse  aorta.   Comparison(s): Compared to previous reports, there is evidence of  vegetations on the tricuspid valve and the mechanical aortic valve and  suspicion for early perivalvular abscess. The dimensionless aortic vale  index is similar to older studies. This  suggests that the increased aortic valve gradients are due to increased  cardiac output (rather than obstruction from the vegetation or compromised  prosthetic disc motion).   Echo IMPRESSIONS    1. Tricuspid valve vegetation. Discussed with referring provider.  2. Left ventricular ejection fraction, by estimation, is 55 to 60%. The  left ventricle has normal function. The left ventricle has no regional  wall  motion abnormalities. Left ventricular diastolic parameters were  normal.  3. Right ventricular systolic function is normal. The right ventricular  size is mildly enlarged. There is normal pulmonary artery systolic  pressure.  4. Left atrial size was moderately dilated.  5. Right atrial size was severely dilated.  6. The mitral valve is normal in structure. Mild mitral valve  regurgitation. No evidence of mitral stenosis.  7. There is Lynzie Cliburn 1.8 x 1.1 cm tricuspid valve mass on the atrial surface  consistent with vegetation. . Tricuspid valve regurgitation is moderate.  8. Transaortic velocity is higher than expected for mechanical aortic  valve. The aortic valve is normal in structure. Aortic valve regurgitation  is not visualized. No aortic stenosis is present. There is Francess Mullen mechanical  valve present in the aortic  position. Procedure Date: 2000. Aortic valve mean gradient  measures 33.5  mmHg. Aortic valve Vmax measures 3.68 m/s.  9. Aortic root/ascending aorta has been repaired/replaced and Bentall.  10. The inferior vena cava is normal in size with greater than 50%  respiratory variability, suggesting right atrial pressure of 3 mmHg.  Antimicrobials:  Anti-infectives (From admission, onward)   Start     Dose/Rate Route Frequency Ordered Stop   09/14/19 1430  metroNIDAZOLE (FLAGYL) tablet 500 mg     500 mg Oral Every 8 hours 09/14/19 1348     09/11/19 1700  gentamicin (GARAMYCIN) 300 mg in dextrose 5 % 50 mL IVPB     300 mg 115 mL/hr over 30 Minutes Intravenous Every 24 hours 09/11/19 1540     09/10/19 2300  vancomycin (VANCOREADY) IVPB 1750 mg/350 mL  Status:  Discontinued     1,750 mg 175 mL/hr over 120 Minutes Intravenous Every 24 hours 09/10/19 0721 09/11/19 1511   09/10/19 0600  vancomycin (VANCOREADY) IVPB 1250 mg/250 mL  Status:  Discontinued     1,250 mg 166.7 mL/hr over 90 Minutes Intravenous Every 12 hours 09/09/19 1345 09/10/19 0721   09/09/19 1430  cefTRIAXone  (ROCEPHIN) 2 g in sodium chloride 0.9 % 100 mL IVPB     2 g 200 mL/hr over 30 Minutes Intravenous Every 24 hours 09/09/19 1335     09/09/19 1430  vancomycin (VANCOCIN) 2,500 mg in sodium chloride 0.9 % 500 mL IVPB     2,500 mg 250 mL/hr over 120 Minutes Intravenous  Once 09/09/19 1345 09/09/19 1847      Subjective: Sore mouth after dental procedure No new complaints  Objective: Vitals:   09/17/19 0948 09/17/19 1100 09/17/19 1200 09/17/19 1344  BP: 133/79   130/89  Pulse: 90 89 83   Resp: 18   16  Temp:    98.6 F (37 C)  TempSrc:    Oral  SpO2: 98% 98% 99%   Weight:      Height:        Intake/Output Summary (Last 24 hours) at 09/17/2019 1835 Last data filed at 09/17/2019 1800 Gross per 24 hour  Intake 1420.23 ml  Output 1660 ml  Net -239.77 ml   Filed Weights   09/15/19 0312 09/16/19 0307 09/17/19 0321  Weight: 129.5 kg 130.3 kg 131.5 kg    Examination:  General: No acute distress. Cardiovascular: Heart sounds show Jeremia Groot regular rate, and rhythm Lungs: Clear to auscultation bilaterally  Abdomen: Soft, nontender, nondistended Neurological: Alert and oriented 3. Moves all extremities 4. Cranial nerves II through XII grossly intact. Skin: buttocks wound appears to be healing well  Extremities: No clubbing or cyanosis. No edema.   Data Reviewed: I have personally reviewed following labs and imaging studies  CBC: Recent Labs  Lab 09/12/19 0535 09/12/19 0535 09/13/19 0424 09/13/19 0424 09/14/19 0909 09/15/19 0530 09/16/19 0754 09/16/19 0936 09/17/19 0400  WBC 10.7*   < > 7.7  --  9.2 8.6 9.9  --  10.2  NEUTROABS 8.1*  --  5.7  --  7.0 6.2  --   --  7.8*  HGB 10.6*   < > 8.9*   < > 9.8* 8.9* 9.3* 8.2*  8.5* 9.2*  HCT 29.7*   < > 25.4*   < > 27.6* 25.0* 26.0* 24.0*  25.0* 26.5*  MCV 80.5   < > 80.9  --  81.2 82.0 81.8  --  81.5  PLT 252   < > 200  --  230 212 219  --  226   < > = values in this interval not displayed.    Basic Metabolic Panel: Recent  Labs  Lab 09/12/19 0535 09/12/19 0535 09/13/19 0424 09/13/19 0424 09/14/19 0339 09/14/19 0909 09/15/19 0530 09/16/19 0754 09/16/19 0936 09/17/19 0400  NA 137   < > 138   < >  --  136 137 136 139  140 138  K 3.7   < > 3.7   < >  --  3.8 3.8 3.8 3.7  3.7 4.0  CL 107   < > 107  --   --  105 106 106  --  108  CO2 22   < > 23  --   --  22 23 23   --  20*  GLUCOSE 199*   < > 160*  --   --  249* 142* 164*  --  143*  BUN 13   < > 13  --   --  15 13 12   --  10  CREATININE 1.17   < > 1.01  --   --  1.21 1.21 1.17  --  1.23  CALCIUM 8.7*   < > 8.4*  --   --  8.7* 8.4* 8.4*  --  8.4*  MG 1.7  --  1.7  --  1.7  --  1.7  --   --  1.5*  PHOS 2.7  --  3.3  --  3.7  --  3.7  --   --  3.2   < > = values in this interval not displayed.    GFR: Estimated Creatinine Clearance: 99 mL/min (by C-G formula based on SCr of 1.23 mg/dL).  Liver Function Tests: Recent Labs  Lab 09/13/19 0424 09/14/19 0339 09/15/19 0530 09/17/19 0400  AST 19 21 21 23   ALT 12 12 13 12   ALKPHOS 64 70 67 67  BILITOT 0.9 0.8 1.1 1.0  PROT 6.5 6.8 6.6 7.5  ALBUMIN 2.3* 2.3* 2.4* 2.6*    CBG: Recent Labs  Lab 09/16/19 2054 09/17/19 0858 09/17/19 0945 09/17/19 1233 09/17/19 1600  GLUCAP 164* 161* 152* 220* 181*     Recent Results (from the past 240 hour(s))  SARS CORONAVIRUS 2 (TAT 6-24 HRS) Nasopharyngeal Nasopharyngeal Swab     Status: None   Collection Time: 09/08/19  1:50 PM   Specimen: Nasopharyngeal Swab  Result Value Ref Range Status   SARS Coronavirus 2 NEGATIVE NEGATIVE Final    Comment: (NOTE) SARS-CoV-2 target nucleic acids are NOT DETECTED. The SARS-CoV-2 RNA is generally detectable in upper and lower respiratory specimens during the acute phase of infection. Negative results do not preclude SARS-CoV-2 infection, do not rule out co-infections with other pathogens, and should not be used as the sole basis for treatment or other patient management decisions. Negative results must be combined  with clinical observations, patient history, and epidemiological information. The expected result is Negative. Fact Sheet for Patients: SugarRoll.be Fact Sheet for Healthcare Providers: https://www.woods-mathews.com/ This test is not yet approved or cleared by the [place] FDA and  has been authorized for detection and/or diagnosis of SARS-CoV-2 by FDA under an Emergency Use Authorization (EUA). This EUA will remain  in effect (meaning this test can be used) for the duration of the COVID-19 declaration under Section 56 4(b)(1) of the Act, 21 U.S.C. section 360bbb-3(b)(1), unless the authorization is terminated or revoked sooner. Performed at [facility], [place] [address] [place], [place] [address]   Gastrointestinal Panel by PCR , Stool  Status: None   Collection Time: 09/09/19 12:21 PM   Specimen: Stool  Result Value Ref Range Status   Campylobacter species NOT DETECTED NOT DETECTED Final   Plesimonas shigelloides NOT DETECTED NOT DETECTED Final   Salmonella species NOT DETECTED NOT DETECTED Final   Yersinia enterocolitica NOT DETECTED NOT DETECTED Final   Vibrio species NOT DETECTED NOT DETECTED Final   Vibrio cholerae NOT DETECTED NOT DETECTED Final   Enteroaggregative E coli (EAEC) NOT DETECTED NOT DETECTED Final   Enteropathogenic E coli (EPEC) NOT DETECTED NOT DETECTED Final   Enterotoxigenic E coli (ETEC) NOT DETECTED NOT DETECTED Final   Shiga like toxin producing E coli (STEC) NOT DETECTED NOT DETECTED Final   Shigella/Enteroinvasive E coli (EIEC) NOT DETECTED NOT DETECTED Final   Cryptosporidium NOT DETECTED NOT DETECTED Final   Cyclospora cayetanensis NOT DETECTED NOT DETECTED Final   Entamoeba histolytica NOT DETECTED NOT DETECTED Final   Giardia lamblia NOT DETECTED NOT DETECTED Final   Adenovirus F40/41 NOT DETECTED NOT DETECTED Final   Astrovirus NOT DETECTED NOT DETECTED Final   Norovirus GI/GII NOT DETECTED  NOT DETECTED Final   Rotavirus Sharanya Templin NOT DETECTED NOT DETECTED Final   Sapovirus (I, II, IV, and V) NOT DETECTED NOT DETECTED Final    Comment: Performed at [facility], [place] [address] [place] [place], [place] [address]  Culture, blood (Routine X 2) w Reflex to ID Panel     Status: Abnormal   Collection Time: 09/09/19  1:12 PM   Specimen: BLOOD  Result Value Ref Range Status   Specimen Description BLOOD RIGHT ANTECUBITAL  Final   Special Requests   Final    BOTTLES DRAWN AEROBIC AND ANAEROBIC Blood Culture adequate volume Performed at [facility], [address] [place], [place] [address]    Culture  Setup Time   Final    GRAM POSITIVE COCCI ANAEROBIC BOTTLE ONLY CRITICAL RESULT CALLED TO, READ BACK BY AND VERIFIED WITH: PHARMD SEAY LAURA AT 0546  BY MESSAN HOUEGNIFIO ON 09/10/2019     Culture STREPTOCOCCUS INFANTARIUS (Geniece Akers)  Final   Report Status 09/12/2019 FINAL  Final   Organism ID, Bacteria STREPTOCOCCUS INFANTARIUS  Final      Susceptibility   Streptococcus infantarius - MIC*    PENICILLIN 0.25 INTERMEDIATE Intermediate     CEFTRIAXONE 0.25 SENSITIVE Sensitive     ERYTHROMYCIN <=0.12 SENSITIVE Sensitive     LEVOFLOXACIN 2 SENSITIVE Sensitive     VANCOMYCIN <=0.12 SENSITIVE Sensitive     * STREPTOCOCCUS INFANTARIUS  Culture, blood (Routine X 2) w Reflex to ID Panel     Status: Abnormal   Collection Time: 09/09/19  1:12 PM   Specimen: BLOOD LEFT HAND  Result Value Ref Range Status   Specimen Description BLOOD LEFT HAND  Final   Special Requests   Final    BOTTLES DRAWN AEROBIC AND ANAEROBIC Blood Culture adequate volume   Culture  Setup Time   Final    AEROBIC BOTTLE ONLY GRAM POSITIVE COCCI IN CHAINS GRAM POSITIVE COCCI IN CLUSTERS CRITICAL VALUE NOTED.  VALUE IS CONSISTENT WITH PREVIOUSLY REPORTED AND CALLED VALUE.    Culture (Yer Castello)  Final    STREPTOCOCCUS INFANTARIUS SUSCEPTIBILITIES PERFORMED ON PREVIOUS CULTURE WITHIN THE LAST 5 DAYS. Performed at [facility], [place] [address], [place], [place] [address]    Report Status 09/12/2019 FINAL  Final  Blood Culture ID Panel (Reflexed)     Status: Abnormal   Collection Time: 09/09/19  1:12 PM  Result Value Ref Range Status   Enterococcus species NOT DETECTED NOT DETECTED Final   Listeria monocytogenes NOT DETECTED NOT DETECTED Final   Staphylococcus species NOT DETECTED NOT DETECTED Final   Staphylococcus aureus (BCID) NOT DETECTED NOT DETECTED Final   Streptococcus species DETECTED (Clayson Riling) NOT DETECTED Final    Comment: Not Enterococcus species, Streptococcus agalactiae, Streptococcus pyogenes, or Streptococcus pneumoniae. CRITICAL RESULT CALLED TO, READ BACK BY AND VERIFIED WITH: Criss Rosales PharmD 12:50 09/10/19 ([email or web address])    Streptococcus agalactiae NOT DETECTED NOT DETECTED Final   Streptococcus pneumoniae NOT DETECTED NOT DETECTED Final   Streptococcus pyogenes NOT DETECTED NOT DETECTED Final   Acinetobacter baumannii NOT DETECTED NOT DETECTED Final   Enterobacteriaceae species NOT DETECTED NOT DETECTED Final   Enterobacter cloacae complex NOT DETECTED NOT DETECTED Final   Escherichia coli NOT DETECTED NOT DETECTED Final   Klebsiella oxytoca NOT DETECTED NOT DETECTED Final   Klebsiella pneumoniae NOT DETECTED NOT DETECTED Final   Proteus species NOT DETECTED NOT DETECTED Final   Serratia marcescens NOT DETECTED NOT DETECTED Final   Haemophilus influenzae NOT DETECTED NOT DETECTED Final   Neisseria meningitidis NOT DETECTED NOT DETECTED Final   Pseudomonas aeruginosa NOT DETECTED NOT DETECTED Final   Candida albicans NOT DETECTED NOT DETECTED Final   Candida glabrata NOT DETECTED NOT DETECTED Final   Candida krusei NOT DETECTED NOT DETECTED Final   Candida parapsilosis NOT DETECTED NOT DETECTED Final   Candida tropicalis NOT DETECTED NOT DETECTED Final    Comment: Performed at [facility], [place], [place], [place] [address]  Culture, blood (routine x 2)     Status: None    Collection Time: 09/10/19  2:46 PM   Specimen: BLOOD RIGHT FOREARM  Result Value Ref Range Status   Specimen Description BLOOD RIGHT FOREARM  Final   Special Requests   Final    BOTTLES DRAWN AEROBIC AND ANAEROBIC Blood Culture adequate volume   Culture   Final    NO GROWTH 5 DAYS Performed at [facility], [place] [address] [place], [place] [address]    Report Status 09/15/2019 FINAL  Final  Culture, blood (routine x 2)     Status: None   Collection Time: 09/10/19  2:51 PM   Specimen: BLOOD RIGHT WRIST  Result Value Ref Range Status   Specimen Description BLOOD RIGHT WRIST  Final   Special Requests   Final    BOTTLES DRAWN AEROBIC ONLY Blood Culture adequate volume   Culture   Final    NO GROWTH 5 DAYS Performed at [facility], [place] [address], [place], [place] [address]    Report Status 09/15/2019 FINAL  Final  Surgical PCR screen     Status: None   Collection Time: 09/16/19  3:34 PM   Specimen: Nasal Mucosa; Nasal Swab  Result Value Ref Range Status   MRSA, PCR NEGATIVE NEGATIVE Final   Staphylococcus aureus NEGATIVE NEGATIVE Final    Comment: (NOTE) The Xpert SA Assay (FDA approved for NASAL specimens in patients 87 years of age and older), is one component of Kaysey Berndt comprehensive surveillance program. It is not intended to diagnose infection nor to guide or monitor treatment. Performed at [facility], [place] [address], [place], [place] [address]          Radiology Studies: CARDIAC CATHETERIZATION  Result Date: 09/16/2019  Ost RCA to Prox RCA lesion is 100% stenosed.  SVG to RVA with Origin lesion is 80% stenosed in  stent.  Hemodynamic findings consistent with mild pulmonary hypertension.  1. Single vessel occlusive CAD with CTO of the ostium of the RCA 2. SVG to RCA with 80% in stent stenosis at the ostium. The RCA is Lakhia Gengler large vessel. 3. Mildly elevated LV filling pressures 4. Mild pulmonary HTN 5. Normal cardiac output. Plan: Redo AVR and CABG to RCA.     DG Foot Complete Left  Result Date: 09/15/2019 CLINICAL DATA:  Gout. EXAM: LEFT FOOT - COMPLETE 3+ VIEW COMPARISON:  None. FINDINGS: There is no evidence of fracture or dislocation. Very mild degenerative changes are seen involving the metatarsophalangeal joint of the left great toe. Soft tissues are unremarkable. IMPRESSION: Very mild degenerative changes without evidence of acute osseous abnormality. Electronically Signed   By: Virgina Norfolk M.D.   On: 09/15/2019 21:30        Scheduled Meds: . aspirin EC  81 mg Oral Daily  . atorvastatin  40 mg Oral Q breakfast  . colchicine  0.6 mg Oral BID  . feeding supplement  1 Container Oral TID BM  . feeding supplement (PRO-STAT SUGAR FREE 64)  30 mL Oral BID  . gabapentin  300 mg Oral Q breakfast  . insulin aspart  0-15 Units Subcutaneous TID WC  . insulin aspart  0-5 Units Subcutaneous QHS  . insulin aspart  6 Units Subcutaneous TID WC  . insulin detemir  6 Units Subcutaneous BID  . levothyroxine  50 mcg Oral Q0600  . metroNIDAZOLE  500 mg Oral Q8H  . mupirocin ointment  1 application Nasal BID  . polyethylene glycol  17 g Oral BID  . sodium chloride flush  3 mL Intravenous Once  . sodium chloride flush  3 mL Intravenous Q12H   Continuous Infusions: . sodium chloride    . cefTRIAXone (ROCEPHIN)  IV 2 g (09/17/19 1010)  . gentamicin 300 mg (09/16/19 2032)  . heparin    . lactated ringers       LOS: 9 days    Time spent: over 30 min    Fayrene Helper, MD [facility]   To contact the attending provider between 7A-7P or the covering provider during after hours 7P-7A, please log into the web site [email or web address] and access using universal [place] password for that web site. If you do not have the password, please call the hospital operator.  09/17/2019, 6:35 PM

## 2019-09-17 NOTE — Transfer of Care (Signed)
Immediate Anesthesia Transfer of Care Note  Patient: [name]  Procedure(s) Performed: EXTRACTION OF TOOTH #'S [phone] WITH ALVEOLOPLASTY AND GROSS DEBRIDEMENT OF REMAINING TEETH (Bilateral )  Patient Location: PACU  Anesthesia Type:General  Level of Consciousness: awake and alert   Airway & Oxygen Therapy: Patient Spontanous Breathing and Patient connected to face mask oxygen  Post-op Assessment: Report given to RN and Post -op Vital signs reviewed and stable  Post vital signs: Reviewed and stable  Last Vitals:  Vitals Value Taken Time  BP    Temp    Pulse 95 09/17/19 0859  Resp 24 09/17/19 0859  SpO2 94 % 09/17/19 0859  Vitals shown include unvalidated device data.  Last Pain:  Vitals:   09/17/19 0321  TempSrc: Oral  PainSc:       Patients Stated Pain Goal: 0 ([id number])  Complications: None

## 2019-09-17 NOTE — Progress Notes (Signed)
IR.  Plan for image-guided T10 bone biopsy tentatively for tomorrow in IR with Dr. Karenann Cai. Patient has been seen/consented for procedure. Patient to be NPO at midnight prior to procedure. Dr. Florene Glen aware of above.  Please call IR with questions/concerns.   Bea Graff Leora Platt, PA-C 09/17/2019, 2:15 PM

## 2019-09-17 NOTE — Progress Notes (Signed)
Patient is back in room. Wife at bedside. Vital signs stable, bed alarm on, call bell in reach.  Rowe Pavy, RN

## 2019-09-17 NOTE — Progress Notes (Signed)
Day of Surgery Procedure(s) (LRB): EXTRACTION OF TOOTH #'S [phone] WITH ALVEOLOPLASTY AND GROSS DEBRIDEMENT OF REMAINING TEETH (Bilateral) Subjective:  Mouth a little sore after extractions today but overall feels fine. No pain from buttock abscess.  Objective: Vital signs in last 24 hours: Temp:  [97.2 F (36.2 C)-99.4 F (37.4 C)] 98.6 F (37 C) (04/27 1344) Pulse Rate:  [83-92] 83 (04/27 1200) Cardiac Rhythm: Normal sinus rhythm (04/27 1200) Resp:  [15-22] 16 (04/27 1344) BP: (130-140)/(79-90) 130/89 (04/27 1344) SpO2:  [92 %-99 %] 99 % (04/27 1200) Weight:  [131.5 kg] 131.5 kg (04/27 0321)  Hemodynamic parameters for last 24 hours:    Intake/Output from previous day: 04/26 0701 - 04/27 0700 In: 750.4 [P.O.:120; I.V.:152.9; IV Piggyback:477.5] Out: 960 [Urine:960] Intake/Output this shift: Total I/O In: 1222 [P.O.:222; I.V.:1000] Out: 700 [Urine:600; Blood:100]  General appearance: alert and cooperative Neurologic: intact Heart: regular rate and rhythm, mechanical valve click, no murmur Lungs: clear to auscultation bilaterally Abdomen: soft, non-tender; bowel sounds normal Extremities: no edema Wound: minimal drainage from buttock abscess  Lab Results: Recent Labs    09/16/19 0754 09/16/19 0754 09/16/19 0936 09/17/19 0400  WBC 9.9  --   --  10.2  HGB 9.3*   < > 8.2*  8.5* 9.2*  HCT 26.0*   < > 24.0*  25.0* 26.5*  PLT 219  --   --  226   < > = values in this interval not displayed.   BMET:  Recent Labs    09/16/19 0754 09/16/19 0754 09/16/19 0936 09/17/19 0400  NA 136   < > 139  140 138  K 3.8   < > 3.7  3.7 4.0  CL 106  --   --  108  CO2 23  --   --  20*  GLUCOSE 164*  --   --  143*  BUN 12  --   --  10  CREATININE 1.17  --   --  1.23  CALCIUM 8.4*  --   --  8.4*   < > = values in this interval not displayed.    PT/INR:  Recent Labs    09/17/19 0400  LABPROT 18.9*  INR 1.6*   ABG    Component Value Date/Time   PHART 7.414  09/16/2019 0936   HCO3 23.4 09/16/2019 0936   HCO3 22.8 09/16/2019 0936   TCO2 24 09/16/2019 0936   TCO2 24 09/16/2019 0936   ACIDBASEDEF 1.0 09/16/2019 0936   ACIDBASEDEF 2.0 09/16/2019 0936   O2SAT 72.0 09/16/2019 0936   O2SAT 99.0 09/16/2019 0936   CBG (last 3)  Recent Labs    09/17/19 0945 09/17/19 1233 09/17/19 1600  GLUCAP 152* 220* 181*    Assessment/Plan: S/P Procedure(s) (LRB): EXTRACTION OF TOOTH #'S [id number] WITH ALVEOLOPLASTY AND GROSS DEBRIDEMENT OF REMAINING TEETH (Bilateral)  He is doing well clinically.  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  The previously stented vein graft has 80% stenosis within the stent.  There is no disease in the left coronary circulation.  He is scheduled to undergo biopsy of the T10 spine lesion tomorrow by IR.  I have decided to wait until Monday morning to do his surgery.  I think he will benefit from a few more days for his mouth to heal up some, his buttock abscess to completely resolve and we should have some pathology results back by then.  I do not think there is any need to  push to get things done on Thursday since it is going to be a complicated long procedure and he will have the best results if there are no ongoing problems leading into it. I discussed this with him and he understands.  LOS: 9 days    Gaye Pollack 09/17/2019

## 2019-09-18 ENCOUNTER — Inpatient Hospital Stay (HOSPITAL_COMMUNITY): Payer: Medicare PPO

## 2019-09-18 DIAGNOSIS — T826XXA Infection and inflammatory reaction due to cardiac valve prosthesis, initial encounter: Secondary | ICD-10-CM | POA: Diagnosis not present

## 2019-09-18 DIAGNOSIS — I079 Rheumatic tricuspid valve disease, unspecified: Secondary | ICD-10-CM | POA: Diagnosis not present

## 2019-09-18 DIAGNOSIS — I25810 Atherosclerosis of coronary artery bypass graft(s) without angina pectoris: Secondary | ICD-10-CM | POA: Diagnosis not present

## 2019-09-18 DIAGNOSIS — I330 Acute and subacute infective endocarditis: Secondary | ICD-10-CM | POA: Diagnosis not present

## 2019-09-18 HISTORY — PX: IR FLUORO GUIDED NEEDLE PLC ASPIRATION/INJECTION LOC: IMG2395

## 2019-09-18 LAB — CBC
HCT: 27.1 % — ABNORMAL LOW (ref 39.0–52.0)
Hemoglobin: 9.4 g/dL — ABNORMAL LOW (ref 13.0–17.0)
MCH: 29 pg (ref 26.0–34.0)
MCHC: 34.7 g/dL (ref 30.0–36.0)
MCV: 83.6 fL (ref 80.0–100.0)
Platelets: 246 10*3/uL (ref 150–400)
RBC: 3.24 MIL/uL — ABNORMAL LOW (ref 4.22–5.81)
RDW: 14.7 % (ref 11.5–15.5)
WBC: 9.7 10*3/uL (ref 4.0–10.5)
nRBC: 0 % (ref 0.0–0.2)

## 2019-09-18 LAB — GENTAMICIN LEVEL, TROUGH: Gentamicin Trough: 1.9 ug/mL (ref 0.5–2.0)

## 2019-09-18 LAB — PULMONARY FUNCTION TEST
FEF 25-75 Pre: 2.27 L/sec
FEF2575-%Pred-Pre: 63 %
FEV1-%Pred-Pre: 57 %
FEV1-Pre: 2.17 L
FEV1FVC-%Pred-Pre: 101 %
FEV6-%Pred-Pre: 57 %
FEV6-Pre: 2.66 L
FEV6FVC-%Pred-Pre: 101 %
FVC-%Pred-Pre: 57 %
FVC-Pre: 2.71 L
Pre FEV1/FVC ratio: 80 %
Pre FEV6/FVC Ratio: 98 %

## 2019-09-18 LAB — COMPREHENSIVE METABOLIC PANEL
ALT: 11 U/L (ref 0–44)
AST: 22 U/L (ref 15–41)
Albumin: 2.7 g/dL — ABNORMAL LOW (ref 3.5–5.0)
Alkaline Phosphatase: 68 U/L (ref 38–126)
Anion gap: 7 (ref 5–15)
BUN: 13 mg/dL (ref 6–20)
CO2: 24 mmol/L (ref 22–32)
Calcium: 8.7 mg/dL — ABNORMAL LOW (ref 8.9–10.3)
Chloride: 106 mmol/L (ref 98–111)
Creatinine, Ser: 1.38 mg/dL — ABNORMAL HIGH (ref 0.61–1.24)
GFR calc Af Amer: >60 mL/min (ref >60)
GFR calc non Af Amer: 58 mL/min — ABNORMAL LOW (ref >60)
Glucose, Bld: 143 mg/dL — ABNORMAL HIGH (ref 70–99)
Potassium: 3.8 mmol/L (ref 3.5–5.1)
Sodium: 137 mmol/L (ref 135–145)
Total Bilirubin: 0.7 mg/dL (ref 0.3–1.2)
Total Protein: 7.7 g/dL (ref 6.5–8.1)

## 2019-09-18 LAB — PROTIME-INR
INR: 1.8 — ABNORMAL HIGH (ref 0.8–1.2)
Prothrombin Time: 20.2 seconds — ABNORMAL HIGH (ref 11.4–15.2)

## 2019-09-18 LAB — GLUCOSE, CAPILLARY
Glucose-Capillary: 115 mg/dL — ABNORMAL HIGH (ref 70–99)
Glucose-Capillary: 110 mg/dL — ABNORMAL HIGH (ref 70–99)
Glucose-Capillary: 97 mg/dL (ref 70–99)
Glucose-Capillary: 233 mg/dL — ABNORMAL HIGH (ref 70–99)

## 2019-09-18 LAB — HEPARIN LEVEL (UNFRACTIONATED): Heparin Unfractionated: 0.36 IU/mL (ref 0.30–0.70)

## 2019-09-18 LAB — MAGNESIUM: Magnesium: 2 mg/dL (ref 1.7–2.4)

## 2019-09-18 LAB — PHOSPHORUS: Phosphorus: 3.5 mg/dL (ref 2.5–4.6)

## 2019-09-18 IMAGING — XA IR FLUORO GUIDE NDL PLMT / BX
1 series · 9 of 9 positions shown · non-contrast
Comparison: none

INDICATION: 53 year-old-male with a T10 lytic lesion involving the T10 vertebral
body extending to the right pedicle.

[Series 300: spine · 9 of 9 slices shown]
[im 1/9]
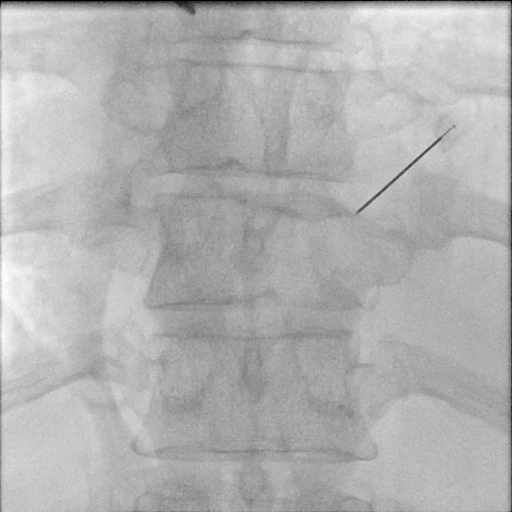
[im 2/9]
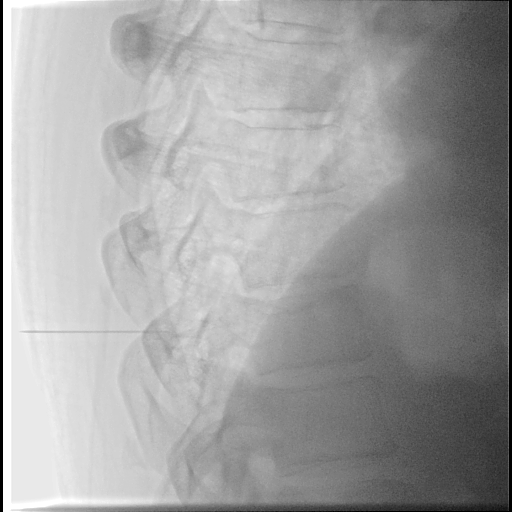
[im 3/9]
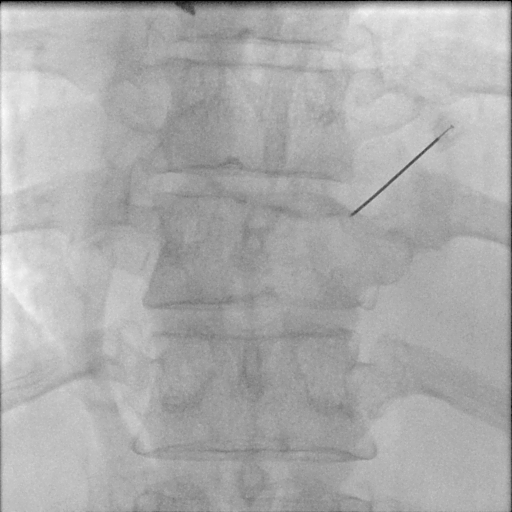
[im 4/9]
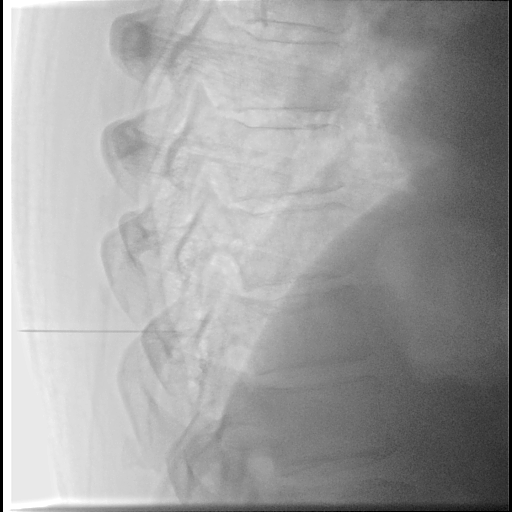
[im 5/9]
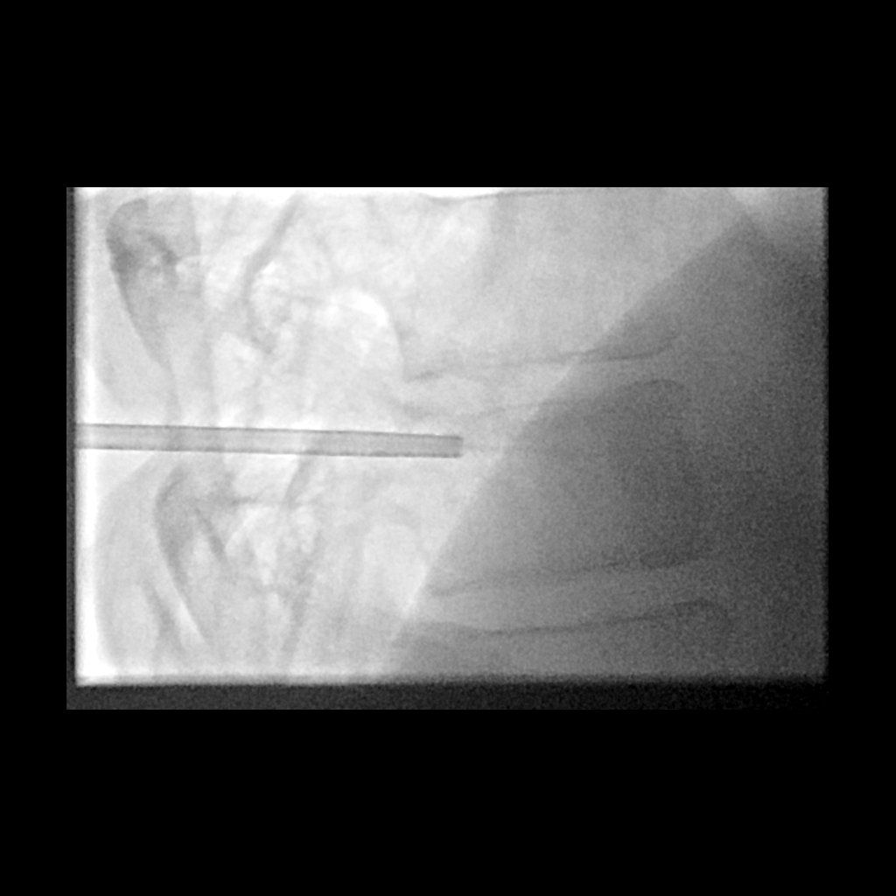
[im 6/9]
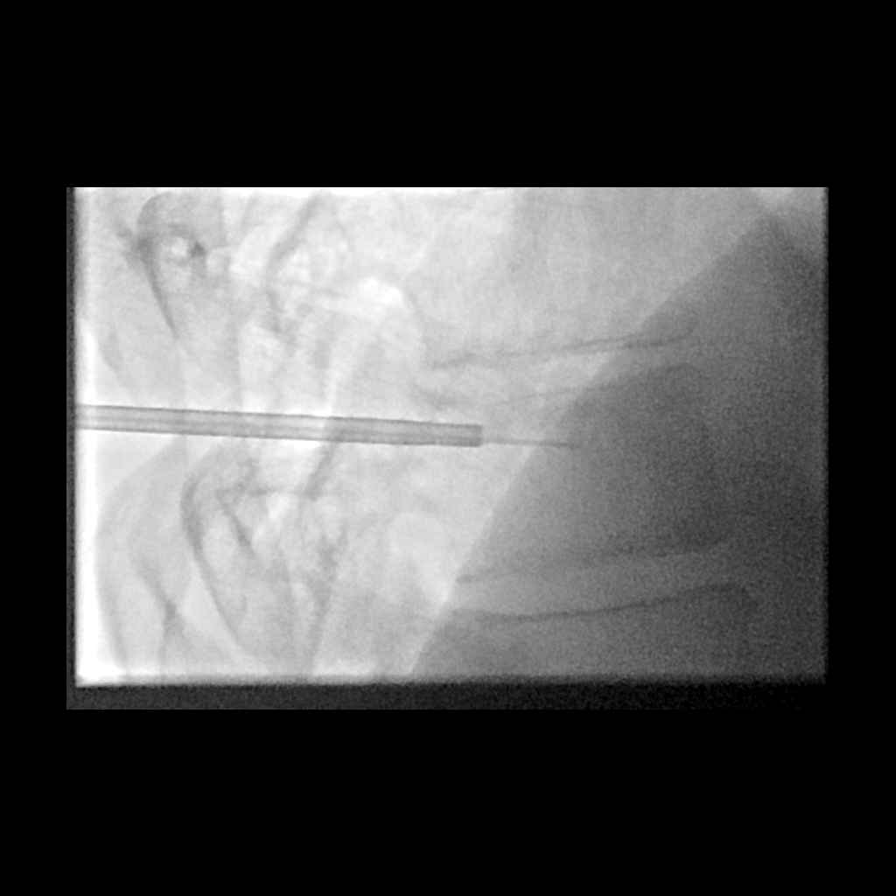
[im 7/9]
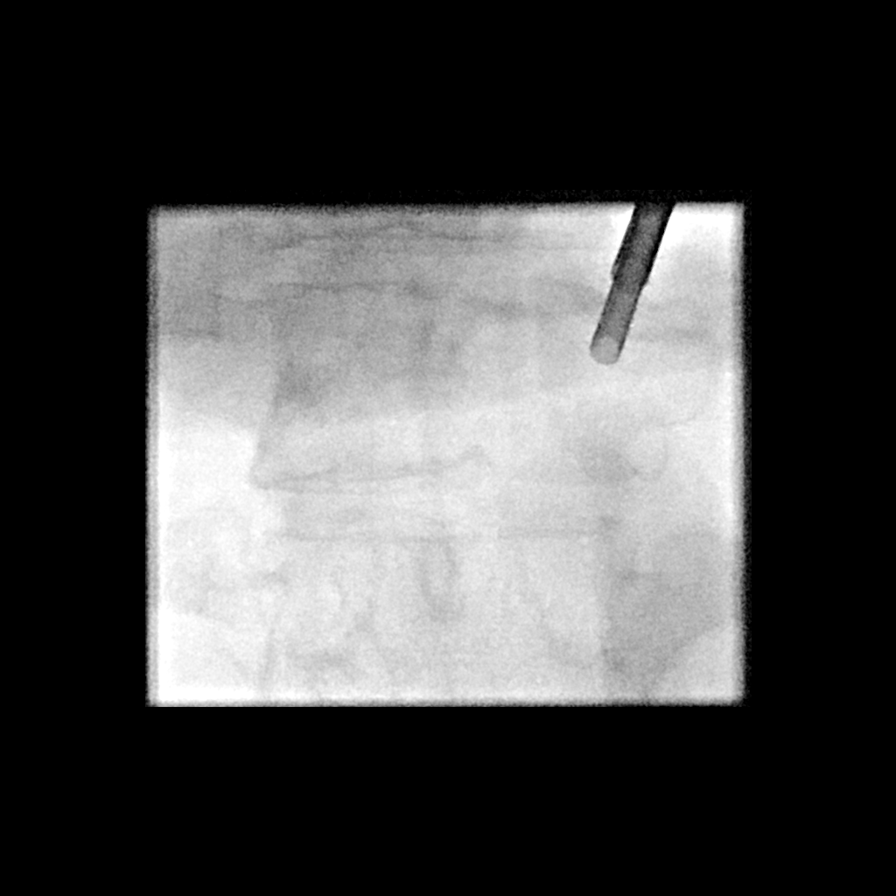
[im 8/9]
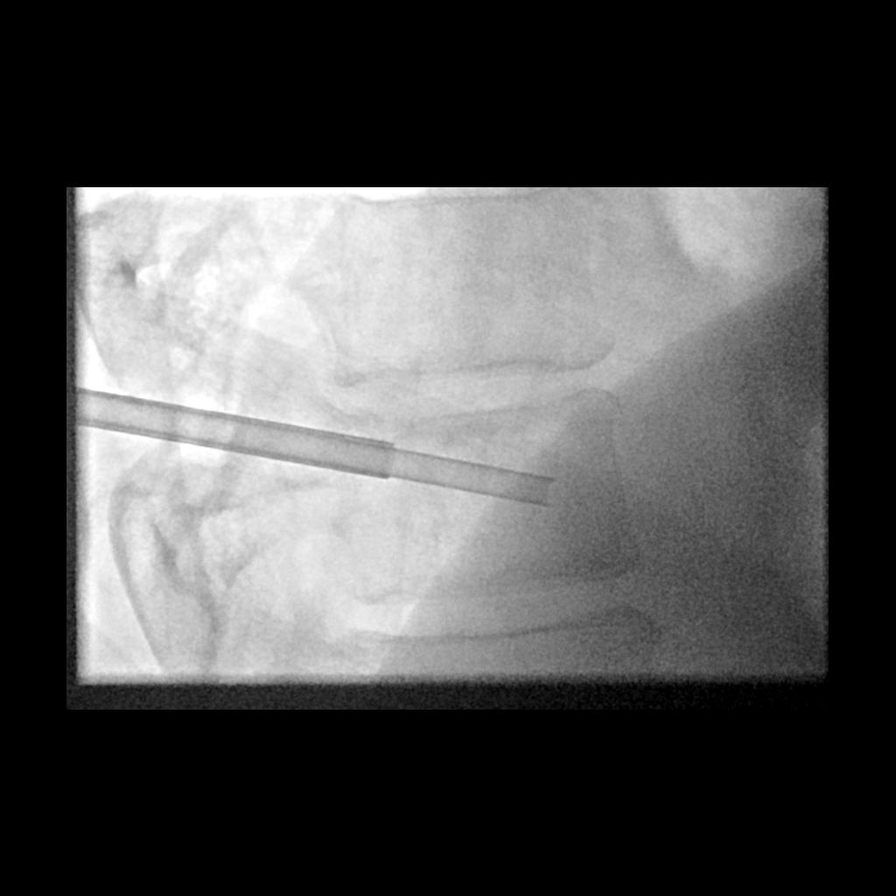
[im 9/9]
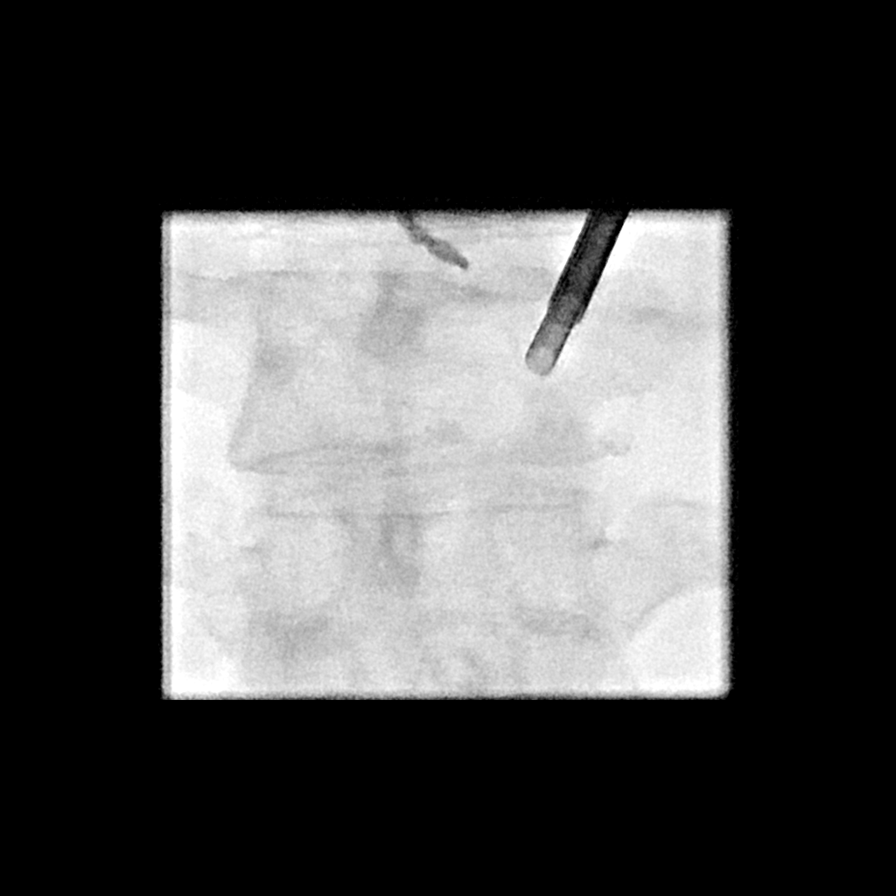

[9 of 9 positions shown; findings below may reference images not displayed]

EXAM:
Fluoroscopy guided fine needle aspiration and core bone biopsy of
T10 vertebral body.

MEDICATIONS:
None.

ANESTHESIA/SEDATION:
Moderate (conscious) sedation was employed during this procedure. A
total of Versed 3 mg and Fentanyl 150 mcg was administered
intravenously.

Moderate Sedation Time: 54 minutes. The patient's level of
consciousness and vital signs were monitored continuously by
radiology nursing throughout the procedure under my direct
supervision.

FLUOROSCOPY TIME:  Fluoroscopy Time: 8 minutes 48 seconds (4889
mGy).

COMPLICATIONS:
None immediate.

PROCEDURE:
Informed written consent was obtained from the patient after a
thorough discussion of the procedural risks, benefits and
alternatives. All questions were addressed. Maximal Sterile Barrier
Technique was utilized including caps, mask, sterile gowns, sterile
gloves, sterile drape, hand hygiene and skin antiseptic. A timeout
was performed prior to the initiation of the procedure.

The patient was placed in prone position on the angiography table.
The thoracic spine region was prepped and draped in a sterile
fashion.

Under fluoroscopy, the T10 vertebral body was delineated and the
skin area was marked. The skin was infiltrated with a 1% Lidocaine
approximately 3 cm lateral to the spinous process projection on the
right. Using a 22-gauge spinal needle, the soft issue and the
peripedicular space and periosteum were infiltrated with Bupivacaine
0.5%. A skin incision was made at the access site.

Subsequently, an 11-gauge Kyphon trocar was inserted under
fluoroscopic guidance until contact with the pedicle was obtained.
The trocar was inserted under light Yasvin Sankur into the pedicle
until the posterior boundaries of the vertebral body was reached.
The diamond mandrill was removed and 3 final needle aspirations and
one core biopsy wereobtained.

The access sites were cleaned and covered with a sterile bandage.
IMPRESSION: Fluoroscopy-guided T10 vertebral body a fine-needle aspiration core
bone biopsy . Bone samples obtained were sent for pathology
analysis.

## 2019-09-18 MED ORDER — MIDAZOLAM HCL 2 MG/2ML IJ SOLN
INTRAMUSCULAR | Status: AC
  Filled 2019-09-18: qty 4

## 2019-09-18 MED ORDER — LIDOCAINE HCL (PF) 1 % IJ SOLN
INTRAMUSCULAR | Status: AC
  Filled 2019-09-18: qty 30

## 2019-09-18 MED ORDER — MIDAZOLAM HCL 2 MG/2ML IJ SOLN
INTRAMUSCULAR | Status: AC | PRN
  Administered 2019-09-18: 0.5 mg via INTRAVENOUS
  Administered 2019-09-18: 1 mg via INTRAVENOUS
  Administered 2019-09-18: 0.5 mg via INTRAVENOUS
  Administered 2019-09-18: 1 mg via INTRAVENOUS

## 2019-09-18 MED ORDER — GENTAMICIN SULFATE 40 MG/ML IJ SOLN
220.0000 mg | INTRAVENOUS | Status: DC
  Administered 2019-09-19 – 2019-09-24 (×6): 220 mg via INTRAVENOUS
  Filled 2019-09-18 (×7): qty 5.5

## 2019-09-18 MED ORDER — BUPIVACAINE HCL (PF) 0.5 % IJ SOLN
INTRAMUSCULAR | Status: AC
  Administered 2019-09-18: 10 mL
  Filled 2019-09-18: qty 30

## 2019-09-18 MED ORDER — FENTANYL CITRATE (PF) 100 MCG/2ML IJ SOLN
INTRAMUSCULAR | Status: AC | PRN
  Administered 2019-09-18 (×2): 25 ug via INTRAVENOUS
  Administered 2019-09-18 (×2): 50 ug via INTRAVENOUS

## 2019-09-18 MED ORDER — HEPARIN (PORCINE) 25000 UT/250ML-% IV SOLN
1950.0000 [IU]/h | INTRAVENOUS | Status: DC
  Administered 2019-09-19: 1950 [IU]/h via INTRAVENOUS
  Administered 2019-09-20 – 2019-09-21 (×4): 2050 [IU]/h via INTRAVENOUS
  Administered 2019-09-22: 1950 [IU]/h via INTRAVENOUS
  Administered 2019-09-22: 2050 [IU]/h via INTRAVENOUS
  Filled 2019-09-18 (×8): qty 250

## 2019-09-18 MED ORDER — BUPIVACAINE HCL (PF) 0.5 % IJ SOLN
INTRAMUSCULAR | Status: AC | PRN
  Administered 2019-09-18: 10 mL

## 2019-09-18 MED ORDER — FENTANYL CITRATE (PF) 100 MCG/2ML IJ SOLN
INTRAMUSCULAR | Status: AC
  Filled 2019-09-18: qty 2

## 2019-09-18 MED ORDER — LIDOCAINE HCL (PF) 1 % IJ SOLN
INTRAMUSCULAR | Status: AC | PRN
  Administered 2019-09-18: 10 mL

## 2019-09-18 MED FILL — Thrombin For Soln 5000 Unit: CUTANEOUS | Qty: 5000 | Status: AC

## 2019-09-18 NOTE — Progress Notes (Signed)
POST OPERATIVE NOTE:  09/18/2019 [name] [id number]  COVID 19 SCREENING: The patient does not symptoms concerning for COVID-19 infection (Including fever, chills, cough, or new SHORTNESS OF BREATH).    VITALS: BP (!) 145/91 (BP Location: Left Arm)   Pulse 76   Temp 98 F (36.7 C) (Axillary)   Resp 16   Ht 6\' 2"  (1.88 m)   Wt 131.5 kg   SpO2 97%   BMI 37.21 kg/m   LABS:  Lab Results  Component Value Date   WBC 9.7 09/18/2019   HGB 9.4 (L) 09/18/2019   HCT 27.1 (L) 09/18/2019   MCV 83.6 09/18/2019   PLT 246 09/18/2019   BMET    Component Value Date/Time   NA 137 09/18/2019 0642   K 3.8 09/18/2019 0642   CL 106 09/18/2019 0642   CO2 24 09/18/2019 0642   GLUCOSE 143 (H) 09/18/2019 0642   BUN 13 09/18/2019 0642   CREATININE 1.38 (H) 09/18/2019 0642   CREATININE 1.14 06/15/2016 0946   CALCIUM 8.7 (L) 09/18/2019 0642   GFRNONAA 58 (L) 09/18/2019 0642   GFRAA >60 09/18/2019 [id number]    Lab Results  Component Value Date   INR 1.8 (H) 09/18/2019   INR 1.6 (H) 09/17/2019   INR 1.7 (H) 09/16/2019   PROTIME 15.9 11/06/2008   PROTIME 15.6 10/23/2008   No results found for: PTT   Jermon Humpert is status post multiple extractions with alveoloplasty and gross to being remaining dentition the operating room with general anesthesia  SUBJECTIVE: Patient denies having significant discomfort.  Patient denies any active bleeding.  Patient is aware that additional teeth were extracted as we had discussed prior to dental surgery yesterday.  Patient expresses understanding for the reasons why the teeth were extracted.  Patient is planning to follow-up with [facility] for replacement of missing teeth after adequate healing and once medically stable from anticipated heart valve surgery  EXAM: There is no sign of infection, heme, or ooze.  Sutures are intact.  Clots are present.  Patient is healing in by generalized primary closure in the extraction sites.  ASSESSMENT: Post  operative course is consistent with dental procedures performed in the operating room with general anesthesia Loss of teeth due to extraction.  PLAN: 1.  Chlorhexidine rinses twice daily after breakfast and at bedtime. 2.  Use normal saline rinses every 2 hours while awake in between the chlorhexidine rinses. 3.  Advance diet as tolerated with nutritional consultation support. 4.  Patient to follow-up with dental medicine for evaluation of healing and suture removal once discharged from the anticipated heart valve surgery.   5.  Patient to follow-up with [facility] for evaluation for replacement of missing teeth as indicated after adequate healing and once medically stable from the anticipated heart valve surgery. 6.  Patient is currently cleared for heart valve surgery with Dr. Cyndia Bent per his scheduling discretion.   Lenn Cal, DDS

## 2019-09-18 NOTE — TOC Initial Note (Signed)
Transition of Care [facility]) - Initial/Assessment Note    Patient Details  Name: [name] MRN: [id number] Date of Birth: 1966-04-17  Transition [facility]) CM/SW Contact:    Bethena Roys, RN Phone Number: 09/18/2019, 3:20 PM  Clinical Narrative: Readmission risk assessment completed. Patient presented for shortness of breath-found infective endocarditis. Redo aortic valve replacement surgery planned for 09/23/19. Prior to arrival patient was from home with spouse. Plan will be to return home once stable post procedure. Patient has primary care physician and he makes it to his appointments without any problems. Patient uses mail order pharmacy via [facility] and has a local pharmacy [facility] for medications that he needs quickly. Case Manager will continue to follow for additional transition of care needs.                  Expected Discharge Plan: Home/Self Care Barriers to Discharge: Continued Medical Work up   Patient Goals and CMS Choice Patient states their goals for this hospitalization and ongoing recovery are:: to return home    Expected Discharge Plan and Services Expected Discharge Plan: Home/Self Care In-house Referral: NA Discharge Planning Services: CM Consult Post Acute Care Choice: NA Living arrangements for the past 2 months: Apartment                  Prior Living Arrangements/Services Living arrangements for the past 2 months: Apartment Lives with:: Spouse Patient language and need for interpreter reviewed:: Yes        Need for Family Participation in Patient Care: Yes (Comment) Care giver support system in place?: Yes (comment)   Criminal Activity/Legal Involvement Pertinent to Current Situation/Hospitalization: No - Comment as needed  Activities of Daily Living Home Assistive Devices/Equipment: Eyeglasses ADL Screening (condition at time of admission) Patient's cognitive ability adequate to safely complete daily activities?:  Yes Is the patient deaf or have difficulty hearing?: No Does the patient have difficulty seeing, even when wearing glasses/contacts?: No Does the patient have difficulty concentrating, remembering, or making decisions?: No Patient able to express need for assistance with ADLs?: Yes Does the patient have difficulty dressing or bathing?: No Independently performs ADLs?: Yes (appropriate for developmental age) Does the patient have difficulty walking or climbing stairs?: Yes Weakness of Legs: Both Weakness of Arms/Hands: None   Emotional Assessment Appearance:: Appears stated age Attitude/Demeanor/Rapport: Engaged Affect (typically observed): Appropriate Orientation: : Oriented to Situation, Oriented to  Time, Oriented to Place, Oriented to Self Alcohol / Substance Use: Not Applicable Psych Involvement: No (comment)  Admission diagnosis:  Dyspnea on exertion [R06.00] Chronic combined systolic and diastolic congestive heart failure (HCC) [I50.42] Nausea vomiting and diarrhea [R11.2, R19.7] Patient Active Problem List   Diagnosis Date Noted  . Gram-positive cocci bacteremia 09/10/2019  . Endocarditis of tricuspid valve 09/10/2019  . Suspected endocarditis mechanical aortic valve 09/10/2019  . Nausea vomiting and diarrhea   . Chronic anticoagulation 04/02/2019  . Wellness examination 09/28/2018  . Lumbar disc herniation 06/06/2018  . CAD (coronary artery disease)   . Low back pain 03/07/2018  . Obesity (BMI 30-39.9) 01/15/2018  . Acute on chronic systolic congestive heart failure ([place])   . Status post coronary artery stent placement   . Shortness of breath 10/22/2017  . Normocytic anemia 08/31/2017  . Chronic diastolic CHF (congestive heart failure) ([place]) 03/30/2016  . Screen for colon cancer 09/03/2015  . PTSD (post-traumatic stress disorder) 10/31/2013  . Depressive disorder 10/31/2013  . Hallucinations 10/31/2013  . Cholelithiasis 08/22/2013  .  Hypothyroidism 08/22/2013  .  Encounter for therapeutic drug monitoring 08/07/2013  . Unstable angina ([place]) 07/25/2013  . CAD of artery bypass graft-  07/24/2013  . Hematoma of groin 07/24/2013  . Retroperitoneal bleed Nov 2014 07/24/2013  . Acute diastolic heart failure ([place]) 07/21/2013  . Ascending aortic dissection  in 2000- s/p [place] and MDT tilting disc AVR   . PAF (paroxysmal atrial fibrillation) ([place])   . Bladder neck obstruction 05/04/2012  . Unspecified vitamin D deficiency 08/07/2008  . Gross hematuria 08/07/2008  . FATIGUE 12/26/2007  . Hyperlipidemia 08/20/2007  . Diabetes mellitus type II, non insulin dependent ([place]) 05/21/2007  . Essential hypertension 05/21/2007  . NEPHROLITHIASIS, HX OF 05/21/2007   PCP:  Biagio Borg, MD Pharmacy:   [place], [place] [address] Phone: [phone] Fax: [phone]  [facility] [phone] - [place], [place] - [address] [place] [address] Phone: [phone] Fax: [phone]     Social Determinants of Health (SDOH) Interventions    Readmission Risk Interventions Readmission Risk Prevention Plan 09/18/2019  Transportation Screening Complete  HRI or Home Care Consult Complete  Social Work Consult for [place] Planning/Counseling Complete  Palliative Care Screening Not Applicable  Medication Review Press photographer) Complete  Some recent data might be hidden

## 2019-09-18 NOTE — Progress Notes (Signed)
PROGRESS NOTE    [name]  [id number] DOB: Sep 21, 1965 DOA: 09/08/2019 PCP: Biagio Borg, MD   Brief Narrative:  54 y.o.malewith history of CAD s/p CABG in 2000, SVG-RCA PCI with BMS in Feb 2015, he had re-do PCI in June 2019 DES to SVG graft, ascending aortic dissection in 2000 s/p repair with AVR on warfarin. Paroxysmal A. fib. NIDDM-2, HTN and HLD presenting with 5 days of chills, nausea, vomiting, diarrhea and dyspnea on exertion and orthopnea mainly with lying on the left side. He's been found to have infective endocarditis with strep infantarius bacteremia.  He had a TEE showing large vegetations on the septal leaflet of the tricuspid valve and on the posterior disc of the aortic valve.  He's on ceftriaxone/gentamicin for his endocarditis at this point.  ID and cardiology are following.  He was seen by dental medicine 4/27 for multiple tooth extraction, alveoloplasty, debridement.  He's also been seen by surgery for a left buttocks abscess which has spontaneously drained and by orthopedics for L MTP pain and swelling which is thought to be gout.  He had an incidental aggressive appearing lesion in T10 which will be biopsied on 4/28 by IR, neurosurgery is also following for this lesion.  Dr. Cyndia Bent from [facility] planning for surgery on Monday 5/3.   Assessment & Plan:   Principal Problem:   Suspected endocarditis mechanical aortic valve Active Problems:   Diabetes mellitus type II, non insulin dependent (HCC)   Hyperlipidemia   Essential hypertension   PAF (paroxysmal atrial fibrillation) (HCC)   Hypothyroidism   Normocytic anemia   Shortness of breath   Obesity (BMI 30-39.9)   CAD (coronary artery disease)   Nausea vomiting and diarrhea   Gram-positive cocci bacteremia   Endocarditis of tricuspid valve   Infective endocarditis -tricuspid vegetation with regurgitation strep Infantarius Bacteremia:  - blood cx 4/19 strep infantarius intermediate to penicillin -  blood cx 4/20 cx NGTD - ID consulted, appreciate recommendations - continue ceftriaxone/gentamicin -Eventually will need outpatient colonoscopy - TEE with large vegetations on septal leaflet of the tricuspid valve and on the posterior disc of the mechanical aortic valve (see report) -Repeat cultures negative, PICC in place - orthopantogram with dental caries and periodontal disease 4/21 -Tentative CT surgery plans 5/3 - 4/27 s/p multiple tooth extraction, alveoloplasty, debridement by dentistry - cardiac catheterization 4/26 -> with single vessel occlusive CAD with CTO of the ostium of the RCA, SVG to RCA with 80% in stent stenosis at the ostium, mildly elevated LV filling pressures, mild pulm HTN -> plan for redo AVR and CABG to RCA -IR-T10 spinal lesion biopsy today - CT angio chest abdomen pelvis   Left Buttocks Abscess: Spontaneous drainage.  Seen by general surgery.  Currently on Flagyl.  Routine dressing per surgery and wound care  Left MTP Pain, concerning for gout continue colchicine.  Seen by orthopedic no further recommendations.  Aggressive appearing lesion in T10 vertebral body:  planned biopsy today Seen by neurosurgery  Nausea/vomiting/diarrhea: GI panel is negative.  Supportive management.  Iron Def Anemia:  Iron supplements with bowel regimen B12, folate-normal  CAD s/p CABG in 2000, SVG-RCA PCI with BMS in Feb 2015, he had re-do PCI in June 2019 DES to SVG graft: Echocardiogram as above. -Continue homeASA andstatin.  -Plavix on hold, last dose 4/19 -Metoprolol, losartan and Lasix on hold due to AKI and soft blood pressures  Ascending aortic dissection in 2000 s/p repair with Mechanical AVR on warfarin.  -Currently on  heparin drip. -Monitor fluid status and renal function.  Paroxysmal A. fib. Rate controlled on metoprolol. -Metoprolol on hold per cardiology. -Heparin drip  Uncontrolled NIDDM-2 with hyperglycemia: A1c  8.2%. -ContinueSSI-moderate -Levemir 6 units twice daily, NovoLog 6 units 3 times daily -Continue statin. Check lipid panel  CKD stage IIIa (vancomycin, Lasix and losartan) -Baseline creatinine 1.2-1.3. -Holding metoprolol, Lasix and losartan -Continue monitoring  Essential HTN: -metoprolol, amlodipine and losartanon hold.  BP's reasonable  HLD  -Continue atorvastatin.  Hypothyroidism: -Continue home Synthroid.   Morbid obesity:Body mass index is 36.03 kg/m.With comorbidities as above. -Encourage lifestyle change to lose weight.  Would benefit from outpatient sleep study   DVT prophylaxis: Heparin drip Code Status: Full code Family Communication: Wife at bedside Disposition Plan:   Patient From= home  Patient Anticipated D/C place= to be determined  Barriers= plan CT surgery early next week, maintain hospital stay    Subjective: Feels okay, no complaints  Review of Systems Otherwise negative except as per HPI, including: General: Denies fever, chills, night sweats or unintended weight loss. Resp: Denies cough, wheezing, shortness of breath. Cardiac: Denies chest pain, palpitations, orthopnea, paroxysmal nocturnal dyspnea. GI: Denies abdominal pain, nausea, vomiting, diarrhea or constipation GU: Denies dysuria, frequency, hesitancy or incontinence MS: Denies muscle aches, joint pain or swelling Neuro: Denies headache, neurologic deficits (focal weakness, numbness, tingling), abnormal gait Psych: Denies anxiety, depression, SI/HI/AVH Skin: Denies new rashes or lesions ID: Denies sick contacts, exotic exposures, travel  Examination:  General exam: Appears calm and comfortable, poor dentition Respiratory system: Clear to auscultation. Respiratory effort normal. Cardiovascular system: S1 & S2 heard, RRR. No JVD, murmurs, rubs, gallops or clicks. No pedal edema. Gastrointestinal system: Abdomen is nondistended, soft and nontender. No organomegaly or  masses felt. Normal bowel sounds heard. Central nervous system: Alert and oriented. No focal neurological deficits. Extremities: Symmetric 5 x 5 power. Skin: No rashes, lesions or ulcers Psychiatry: Judgement and insight appear normal. Mood & affect appropriate.     Objective: Vitals:   09/17/19 1344 09/17/19 1947 09/17/19 2356 09/18/19 0453  BP: 130/89 134/88 123/83 (!) 145/91  Pulse:  75 73 76  Resp: 16     Temp: 98.6 F (37 C) 98.1 F (36.7 C) 98.1 F (36.7 C) 98 F (36.7 C)  TempSrc: Oral Axillary Oral Axillary  SpO2:  100% 97% 97%  Weight:    131.5 kg  Height:        Intake/Output Summary (Last 24 hours) at 09/18/2019 0854 Last data filed at 09/18/2019 0000 Gross per 24 hour  Intake 763 ml  Output 900 ml  Net -137 ml   Filed Weights   09/16/19 0307 09/17/19 0321 09/18/19 0453  Weight: 130.3 kg 131.5 kg 131.5 kg     Data Reviewed:   CBC: Recent Labs  Lab 09/12/19 0535 09/12/19 0535 09/13/19 0424 09/13/19 0424 09/14/19 0909 09/14/19 0909 09/15/19 0530 09/16/19 0754 09/16/19 0936 09/17/19 0400 09/18/19 0642  WBC 10.7*   < > 7.7   < > 9.2  --  8.6 9.9  --  10.2 9.7  NEUTROABS 8.1*  --  5.7  --  7.0  --  6.2  --   --  7.8*  --   HGB 10.6*   < > 8.9*   < > 9.8*   < > 8.9* 9.3* 8.2*  8.5* 9.2* 9.4*  HCT 29.7*   < > 25.4*   < > 27.6*   < > 25.0* 26.0* 24.0*  25.0* 26.5* 27.1*  MCV 80.5   < > 80.9   < > 81.2  --  82.0 81.8  --  81.5 83.6  PLT 252   < > 200   < > 230  --  212 219  --  226 246   < > = values in this interval not displayed.   Basic Metabolic Panel: Recent Labs  Lab 09/13/19 0424 09/13/19 0424 09/14/19 0339 09/14/19 0909 09/14/19 0909 09/15/19 0530 09/16/19 0754 09/16/19 0936 09/17/19 0400 09/18/19 0642  NA 138   < >  --  136   < > 137 136 139  140 138 137  K 3.7   < >  --  3.8   < > 3.8 3.8 3.7  3.7 4.0 3.8  CL 107   < >  --  105  --  106 106  --  108 106  CO2 23   < >  --  22  --  23 23  --  20* 24  GLUCOSE 160*   < >  --   249*  --  142* 164*  --  143* 143*  BUN 13   < >  --  15  --  13 12  --  10 13  CREATININE 1.01   < >  --  1.21  --  1.21 1.17  --  1.23 1.38*  CALCIUM 8.4*   < >  --  8.7*  --  8.4* 8.4*  --  8.4* 8.7*  MG 1.7  --  1.7  --   --  1.7  --   --  1.5* 2.0  PHOS 3.3  --  3.7  --   --  3.7  --   --  3.2 3.5   < > = values in this interval not displayed.   GFR: Estimated Creatinine Clearance: 88.2 mL/min (A) (by C-G formula based on SCr of 1.38 mg/dL (H)). Liver Function Tests: Recent Labs  Lab 09/13/19 0424 09/14/19 0339 09/15/19 0530 09/17/19 0400 09/18/19 0642  AST 19 21 21 23 22   ALT 12 12 13 12 11   ALKPHOS 64 70 67 67 68  BILITOT 0.9 0.8 1.1 1.0 0.7  PROT 6.5 6.8 6.6 7.5 7.7  ALBUMIN 2.3* 2.3* 2.4* 2.6* 2.7*   No results for input(s): LIPASE, AMYLASE in the last 168 hours. No results for input(s): AMMONIA in the last 168 hours. Coagulation Profile: Recent Labs  Lab 09/14/19 0339 09/15/19 0530 09/16/19 0302 09/17/19 0400 09/18/19 0642  INR 2.3* 2.0* 1.7* 1.6* 1.8*   Cardiac Enzymes: No results for input(s): CKTOTAL, CKMB, CKMBINDEX, TROPONINI in the last 168 hours. BNP (last 3 results) No results for input(s): PROBNP in the last 8760 hours. HbA1C: No results for input(s): HGBA1C in the last 72 hours. CBG: Recent Labs  Lab 09/17/19 0945 09/17/19 1233 09/17/19 1600 09/17/19 2106 09/18/19 0809  GLUCAP 152* 220* 181* 186* 115*   Lipid Profile: No results for input(s): CHOL, HDL, LDLCALC, TRIG, CHOLHDL, LDLDIRECT in the last 72 hours. Thyroid Function Tests: No results for input(s): TSH, T4TOTAL, FREET4, T3FREE, THYROIDAB in the last 72 hours. Anemia Panel: Recent Labs    09/16/19 0302  VITAMINB12 285  FOLATE 9.3   Sepsis Labs: No results for input(s): PROCALCITON, LATICACIDVEN in the last 168 hours.  Recent Results (from the past 240 hour(s))  SARS CORONAVIRUS 2 (TAT 6-24 HRS) Nasopharyngeal Nasopharyngeal Swab     Status: None   Collection Time:  09/08/19  1:50 PM  Specimen: Nasopharyngeal Swab  Result Value Ref Range Status   SARS Coronavirus 2 NEGATIVE NEGATIVE Final    Comment: (NOTE) SARS-CoV-2 target nucleic acids are NOT DETECTED. The SARS-CoV-2 RNA is generally detectable in upper and lower respiratory specimens during the acute phase of infection. Negative results do not preclude SARS-CoV-2 infection, do not rule out co-infections with other pathogens, and should not be used as the sole basis for treatment or other patient management decisions. Negative results must be combined with clinical observations, patient history, and epidemiological information. The expected result is Negative. Fact Sheet for Patients: SugarRoll.be Fact Sheet for Healthcare Providers: https://www.woods-mathews.com/ This test is not yet approved or cleared by the [place] FDA and  has been authorized for detection and/or diagnosis of SARS-CoV-2 by FDA under an Emergency Use Authorization (EUA). This EUA will remain  in effect (meaning this test can be used) for the duration of the COVID-19 declaration under Section 56 4(b)(1) of the Act, 21 U.S.C. section 360bbb-3(b)(1), unless the authorization is terminated or revoked sooner. Performed at [facility], [place] [address], [place], [place] [address]   Gastrointestinal Panel by PCR , Stool     Status: None   Collection Time: 09/09/19 12:21 PM   Specimen: Stool  Result Value Ref Range Status   Campylobacter species NOT DETECTED NOT DETECTED Final   Plesimonas shigelloides NOT DETECTED NOT DETECTED Final   Salmonella species NOT DETECTED NOT DETECTED Final   Yersinia enterocolitica NOT DETECTED NOT DETECTED Final   Vibrio species NOT DETECTED NOT DETECTED Final   Vibrio cholerae NOT DETECTED NOT DETECTED Final   Enteroaggregative E coli (EAEC) NOT DETECTED NOT DETECTED Final   Enteropathogenic E coli (EPEC) NOT DETECTED NOT DETECTED Final    Enterotoxigenic E coli (ETEC) NOT DETECTED NOT DETECTED Final   Shiga like toxin producing E coli (STEC) NOT DETECTED NOT DETECTED Final   Shigella/Enteroinvasive E coli (EIEC) NOT DETECTED NOT DETECTED Final   Cryptosporidium NOT DETECTED NOT DETECTED Final   Cyclospora cayetanensis NOT DETECTED NOT DETECTED Final   Entamoeba histolytica NOT DETECTED NOT DETECTED Final   Giardia lamblia NOT DETECTED NOT DETECTED Final   Adenovirus F40/41 NOT DETECTED NOT DETECTED Final   Astrovirus NOT DETECTED NOT DETECTED Final   Norovirus GI/GII NOT DETECTED NOT DETECTED Final   Rotavirus A NOT DETECTED NOT DETECTED Final   Sapovirus (I, II, IV, and V) NOT DETECTED NOT DETECTED Final    Comment: Performed at [facility], [address] [place], [place] [address]  Culture, blood (Routine X 2) w Reflex to ID Panel     Status: Abnormal   Collection Time: 09/09/19  1:12 PM   Specimen: BLOOD  Result Value Ref Range Status   Specimen Description BLOOD RIGHT ANTECUBITAL  Final   Special Requests   Final    BOTTLES DRAWN AEROBIC AND ANAEROBIC Blood Culture adequate volume Performed at [facility], [address] [place], [place] [address]    Culture  Setup Time   Final    GRAM POSITIVE COCCI ANAEROBIC BOTTLE ONLY CRITICAL RESULT CALLED TO, READ BACK BY AND VERIFIED WITH: PHARMD SEAY LAURA AT 0546  BY [place] ON 09/10/2019     Culture STREPTOCOCCUS INFANTARIUS (A)  Final   Report Status 09/12/2019 FINAL  Final   Organism ID, Bacteria STREPTOCOCCUS INFANTARIUS  Final      Susceptibility   Streptococcus infantarius - MIC*    PENICILLIN 0.25 INTERMEDIATE Intermediate  CEFTRIAXONE 0.25 SENSITIVE Sensitive     ERYTHROMYCIN <=0.12 SENSITIVE Sensitive     LEVOFLOXACIN 2 SENSITIVE Sensitive     VANCOMYCIN <=0.12 SENSITIVE Sensitive     * STREPTOCOCCUS INFANTARIUS  Culture, blood (Routine X 2) w Reflex to ID Panel     Status: Abnormal   Collection Time: 09/09/19  1:12 PM    Specimen: BLOOD LEFT HAND  Result Value Ref Range Status   Specimen Description BLOOD LEFT HAND  Final   Special Requests   Final    BOTTLES DRAWN AEROBIC AND ANAEROBIC Blood Culture adequate volume   Culture  Setup Time   Final    AEROBIC BOTTLE ONLY GRAM POSITIVE COCCI IN CHAINS GRAM POSITIVE COCCI IN CLUSTERS CRITICAL VALUE NOTED.  VALUE IS CONSISTENT WITH PREVIOUSLY REPORTED AND CALLED VALUE.    Culture (A)  Final    STREPTOCOCCUS INFANTARIUS SUSCEPTIBILITIES PERFORMED ON PREVIOUS CULTURE WITHIN THE LAST 5 DAYS. Performed at [facility], [place] [address], [place], [place] [address]    Report Status 09/12/2019 FINAL  Final  Blood Culture ID Panel (Reflexed)     Status: Abnormal   Collection Time: 09/09/19  1:12 PM  Result Value Ref Range Status   Enterococcus species NOT DETECTED NOT DETECTED Final   Listeria monocytogenes NOT DETECTED NOT DETECTED Final   Staphylococcus species NOT DETECTED NOT DETECTED Final   Staphylococcus aureus (BCID) NOT DETECTED NOT DETECTED Final   Streptococcus species DETECTED (A) NOT DETECTED Final    Comment: Not Enterococcus species, Streptococcus agalactiae, Streptococcus pyogenes, or Streptococcus pneumoniae. CRITICAL RESULT CALLED TO, READ BACK BY AND VERIFIED WITH: Criss Rosales PharmD 12:50 09/10/19 ([email or web address])    Streptococcus agalactiae NOT DETECTED NOT DETECTED Final   Streptococcus pneumoniae NOT DETECTED NOT DETECTED Final   Streptococcus pyogenes NOT DETECTED NOT DETECTED Final   Acinetobacter baumannii NOT DETECTED NOT DETECTED Final   Enterobacteriaceae species NOT DETECTED NOT DETECTED Final   Enterobacter cloacae complex NOT DETECTED NOT DETECTED Final   Escherichia coli NOT DETECTED NOT DETECTED Final   Klebsiella oxytoca NOT DETECTED NOT DETECTED Final   Klebsiella pneumoniae NOT DETECTED NOT DETECTED Final   Proteus species NOT DETECTED NOT DETECTED Final   Serratia marcescens NOT DETECTED NOT DETECTED Final   Haemophilus  influenzae NOT DETECTED NOT DETECTED Final   Neisseria meningitidis NOT DETECTED NOT DETECTED Final   Pseudomonas aeruginosa NOT DETECTED NOT DETECTED Final   Candida albicans NOT DETECTED NOT DETECTED Final   Candida glabrata NOT DETECTED NOT DETECTED Final   Candida krusei NOT DETECTED NOT DETECTED Final   Candida parapsilosis NOT DETECTED NOT DETECTED Final   Candida tropicalis NOT DETECTED NOT DETECTED Final    Comment: Performed at [facility], [place], [place], [place] [address]  Culture, blood (routine x 2)     Status: None   Collection Time: 09/10/19  2:46 PM   Specimen: BLOOD RIGHT FOREARM  Result Value Ref Range Status   Specimen Description BLOOD RIGHT FOREARM  Final   Special Requests   Final    BOTTLES DRAWN AEROBIC AND ANAEROBIC Blood Culture adequate volume   Culture   Final    NO GROWTH 5 DAYS Performed at [facility], [place] [address] [place], [place] [address]    Report Status 09/15/2019 FINAL  Final  Culture, blood (routine x 2)     Status: None   Collection Time: 09/10/19  2:51 PM   Specimen: BLOOD RIGHT WRIST  Result Value  Ref Range Status   Specimen Description BLOOD RIGHT WRIST  Final   Special Requests   Final    BOTTLES DRAWN AEROBIC ONLY Blood Culture adequate volume   Culture   Final    NO GROWTH 5 DAYS Performed at [facility], [address] [place], [place] [address]    Report Status 09/15/2019 FINAL  Final  Surgical PCR screen     Status: None   Collection Time: 09/16/19  3:34 PM   Specimen: Nasal Mucosa; Nasal Swab  Result Value Ref Range Status   MRSA, PCR NEGATIVE NEGATIVE Final   Staphylococcus aureus NEGATIVE NEGATIVE Final    Comment: (NOTE) The Xpert SA Assay (FDA approved for NASAL specimens in patients 21 years of age and older), is one component of a comprehensive surveillance program. It is not intended to diagnose infection nor to guide or monitor treatment. Performed at [facility], [place]  [address] [place], [place] [address]          Radiology Studies: CARDIAC CATHETERIZATION  Result Date: 09/16/2019  Ost RCA to Prox RCA lesion is 100% stenosed.  SVG to RVA with Origin lesion is 80% stenosed in stent.  Hemodynamic findings consistent with mild pulmonary hypertension.  1. Single vessel occlusive CAD with CTO of the ostium of the RCA 2. SVG to RCA with 80% in stent stenosis at the ostium. The RCA is a large vessel. 3. Mildly elevated LV filling pressures 4. Mild pulmonary HTN 5. Normal cardiac output. Plan: Redo AVR and CABG to RCA.        Scheduled Meds: . aspirin EC  81 mg Oral Daily  . atorvastatin  40 mg Oral Q breakfast  . colchicine  0.6 mg Oral BID  . feeding supplement  1 Container Oral TID BM  . feeding supplement (PRO-STAT SUGAR FREE 64)  30 mL Oral BID  . gabapentin  300 mg Oral Q breakfast  . insulin aspart  0-15 Units Subcutaneous TID WC  . insulin aspart  0-5 Units Subcutaneous QHS  . insulin aspart  6 Units Subcutaneous TID WC  . insulin detemir  6 Units Subcutaneous BID  . levothyroxine  50 mcg Oral Q0600  . metroNIDAZOLE  500 mg Oral Q8H  . mupirocin ointment  1 application Nasal BID  . polyethylene glycol  17 g Oral BID  . sodium chloride flush  3 mL Intravenous Once  . sodium chloride flush  3 mL Intravenous Q12H   Continuous Infusions: . sodium chloride    . cefTRIAXone (ROCEPHIN)  IV 2 g (09/17/19 1010)  . gentamicin 300 mg (09/17/19 2057)  . heparin Stopped (09/18/19 0756)  . lactated ringers       LOS: 10 days   Time spent=35 mins    Sanah Kraska Arsenio Loader, MD [facility]  If 7PM-7AM, please contact night-coverage  09/18/2019, 8:54 AM

## 2019-09-18 NOTE — Progress Notes (Signed)
Heparin gtt stopped pre-procedure in IR and needs to be restarted. RN contact Reesa Chew, MD and Pharmacist  M[place]). Per protocol, Pharmacist stated heparin will be restarted in 6 hours and orders will be placed.

## 2019-09-18 NOTE — Progress Notes (Signed)
[place] for warfarin>>heparin Indication: atrial fibrillation + mechanical AVR  Allergies  Allergen Reactions  . Diltiazem Hives  . Insulin Glargine Swelling    edema    Patient Measurements: Height: 6\' 2"  (188 cm) Weight: 131.5 kg (289 lb 12.8 oz) IBW/kg (Calculated) : 82.2  Vital Signs: Temp: 98 F (36.7 C) (04/28 0453) Temp Source: Axillary (04/28 0453) BP: 145/91 (04/28 0453) Pulse Rate: 76 (04/28 0453)  Labs: Recent Labs    09/15/19 1414 09/16/19 0302 09/16/19 0754 09/16/19 0754 09/16/19 0936 09/16/19 0936 09/17/19 0400 09/18/19 0642  HGB  --   --  9.3*   < > 8.2*  8.5*   < > 9.2* 9.4*  HCT  --   --  26.0*   < > 24.0*  25.0*  --  26.5* 27.1*  PLT  --   --  219  --   --   --  226 246  LABPROT  --  20.3*  --   --   --   --  18.9* 20.2*  INR  --  1.7*  --   --   --   --  1.6* 1.8*  HEPARINUNFRC 0.35 0.38  --   --   --   --   --  0.36  CREATININE  --   --  1.17  --   --   --  1.23 1.38*   < > = values in this interval not displayed.    Estimated Creatinine Clearance: 88.2 mL/min (A) (by C-G formula based on SCr of 1.38 mg/dL (H)).   Medical History: Past Medical History:  Diagnosis Date  . Ascending aortic dissection ([place])    a. 2000 - with aortic valve involvement. S/p repair of aortic dissection with placement of mechanical AVR.  [name] CAD (coronary artery disease)    a. 2000 VG->PDA @ time of Ao dissection repair;  b. Cardiac CT 06/2010: no CAD, only mild plaque in prox RCA;  c. 2015 Cath: LM nl, LAD nl, LCX nl, RCA 100, VG->PDA 90 (4.5x12 Rebel BMS). d. 10/23/17 instent restenosis SVG to RCA, DES placed.  . Cholelithiasis 08/22/2013  . Chronic diastolic CHF (congestive heart failure) ([place])    a. 03/2016 Echo: EF 55-60%, no rwma, Gr2 DD.  . Diabetes mellitus   . Dyspnea   . Gross hematuria   . H/O mechanical aortic valve replacement    a. 03/2016 Echo: AoV mean gradient 53mmHg, Valve Area (VTI) 1.36 cm^2, (Vmax) 1.22  cm^2, mod dil LA.  [name] Hyperlipidemia   . Hypertension   . NEPHROLITHIASIS, HX OF   . Obesity   . PAF (paroxysmal atrial fibrillation) ([place])    a. H/o such, with recurrence of coarse afib/flutter during ER consult 03/2012.  . Stroke ([place])   . TRANSIENT ISCHEMIC ATTACK, HX OF    a. In 2004.  [name] Unspecified vitamin D deficiency    Assessment: 29 YOM presenting with SOB and N/V, on warfarin PTA for Afib and mechanical AVR. Vegetation seen on TTE, will need to hold warfarin and begin IV heparin with impending need for procedures. He is s/p cath 4/26 with plans for CABG and redo AVR.    Now s/p teeth extraction 4/27 and heparin to restarted . Plans noted for biopsy on 4/29 and open heart surgery on 5/3 -heparin level at goal   PTA dosing: 5mg  MWF, 7.5mg  all other days    Goal of Therapy:  Heparin level 0.3-0.7 units/mL Monitor platelets  by anticoagulation protocol: Yes   Plan:  Continue heparin at 1950 units/hr -Heparin level daily wth CBC daily -Will follow procedural plans  Hildred Laser, PharmD Clinical Pharmacist **Pharmacist phone directory can now be found on [email or web address] ([email or web address]).  Listed under [place].   **Pharmacist phone directory can now be found on [email or web address] ([email or web address]).  Listed under [place].

## 2019-09-18 NOTE — Progress Notes (Signed)
Progress Note  Patient Name: [name] Date of Encounter: 09/18/2019  Primary Cardiologist: Kirk Ruths, MD   Subjective   Mild soreness at site of incisor extraction, no active bleeding.  Otherwise feels well. For biopsy of T10 lytic lesion today. Cardiac surgery has been postponed for Monday, May 3.  Inpatient Medications    Scheduled Meds: . aspirin EC  81 mg Oral Daily  . atorvastatin  40 mg Oral Q breakfast  . colchicine  0.6 mg Oral BID  . feeding supplement  1 Container Oral TID BM  . feeding supplement (PRO-STAT SUGAR FREE 64)  30 mL Oral BID  . gabapentin  300 mg Oral Q breakfast  . insulin aspart  0-15 Units Subcutaneous TID WC  . insulin aspart  0-5 Units Subcutaneous QHS  . insulin aspart  6 Units Subcutaneous TID WC  . insulin detemir  6 Units Subcutaneous BID  . levothyroxine  50 mcg Oral Q0600  . metroNIDAZOLE  500 mg Oral Q8H  . mupirocin ointment  1 application Nasal BID  . polyethylene glycol  17 g Oral BID  . sodium chloride flush  3 mL Intravenous Once  . sodium chloride flush  3 mL Intravenous Q12H   Continuous Infusions: . sodium chloride    . cefTRIAXone (ROCEPHIN)  IV 2 g (09/17/19 1010)  . gentamicin 300 mg (09/17/19 2057)  . heparin 1,950 Units/hr (09/18/19 0946)  . lactated ringers     PRN Meds: sodium chloride, acetaminophen **OR** acetaminophen, HYDROcodone-acetaminophen, nitroGLYCERIN, ondansetron **OR** ondansetron (ZOFRAN) IV, sodium chloride flush   Vital Signs    Vitals:   09/17/19 1344 09/17/19 1947 09/17/19 2356 09/18/19 0453  BP: 130/89 134/88 123/83 (!) 145/91  Pulse:  75 73 76  Resp: 16     Temp: 98.6 F (37 C) 98.1 F (36.7 C) 98.1 F (36.7 C) 98 F (36.7 C)  TempSrc: Oral Axillary Oral Axillary  SpO2:  100% 97% 97%  Weight:    131.5 kg  Height:        Intake/Output Summary (Last 24 hours) at 09/18/2019 1028 Last data filed at 09/18/2019 0000 Gross per 24 hour  Intake 463 ml  Output 900 ml  Net -437  ml   Last 3 Weights 09/18/2019 09/17/2019 09/16/2019  Weight (lbs) 289 lb 12.8 oz 289 lb 14.4 oz 287 lb 3.2 oz  Weight (kg) 131.452 kg 131.498 kg 130.273 kg      Telemetry    Sinus rhythm, PR appears unchanged at about 240 ms- Personally Reviewed  ECG    No new tracing- Personally Reviewed  Physical Exam  Obese.  Appears comfortable GEN: No acute distress.   Neck: No JVD Cardiac: RRR, crisp mechanical valve clicks, 2/6 aortic ejection murmur, no diastolic murmurs, rubs, or gallops.  Respiratory: Clear to auscultation bilaterally. GI: Soft, nontender, non-distended  MS: No edema; No deformity. Neuro:  Nonfocal  Psych: Normal affect   Labs    High Sensitivity Troponin:   Recent Labs  Lab 09/08/19 0810 09/08/19 1100  TROPONINIHS 34* 28*      Chemistry Recent Labs  Lab 09/15/19 0530 09/15/19 0530 09/16/19 0754 09/16/19 0754 09/16/19 0936 09/17/19 0400 09/18/19 0642  NA 137   < > 136   < > 139  140 138 137  K 3.8   < > 3.8   < > 3.7  3.7 4.0 3.8  CL 106   < > 106  --   --  108 106  CO2 23   < >  23  --   --  20* 24  GLUCOSE 142*   < > 164*  --   --  143* 143*  BUN 13   < > 12  --   --  10 13  CREATININE 1.21   < > 1.17  --   --  1.23 1.38*  CALCIUM 8.4*   < > 8.4*  --   --  8.4* 8.7*  PROT 6.6  --   --   --   --  7.5 7.7  ALBUMIN 2.4*  --   --   --   --  2.6* 2.7*  AST 21  --   --   --   --  23 22  ALT 13  --   --   --   --  12 11  ALKPHOS 67  --   --   --   --  67 68  BILITOT 1.1  --   --   --   --  1.0 0.7  GFRNONAA >60   < > >60  --   --  >60 58*  GFRAA >60   < > >60  --   --  >60 >60  ANIONGAP 8   < > 7  --   --  10 7   < > = values in this interval not displayed.     Hematology Recent Labs  Lab 09/16/19 0754 09/16/19 0754 09/16/19 0936 09/17/19 0400 09/18/19 0642  WBC 9.9  --   --  10.2 9.7  RBC 3.18*  --   --  3.25* 3.24*  HGB 9.3*   < > 8.2*  8.5* 9.2* 9.4*  HCT 26.0*   < > 24.0*  25.0* 26.5* 27.1*  MCV 81.8  --   --  81.5 83.6  MCH  29.2  --   --  28.3 29.0  MCHC 35.8  --   --  34.7 34.7  RDW 14.2  --   --  14.6 14.7  PLT 219  --   --  226 246   < > = values in this interval not displayed.    BNPNo results for input(s): BNP, PROBNP in the last 168 hours.   DDimer No results for input(s): DDIMER in the last 168 hours.   Radiology    No results found.  Cardiac Studies    CATH today  Ost RCA to Prox RCA lesion is 100% stenosed.  SVG to RVA with Origin lesion is 80% stenosed in stent.  Hemodynamic findings consistent with mild pulmonary hypertension.  1. Single vessel occlusive CAD with CTO of the ostium of the RCA 2. SVG to RCA with 80% in stent stenosis at the ostium. The RCA is a large vessel. 3. Mildly elevated LV filling pressures 4. Mild pulmonary HTN 5. Normal cardiac output.   Plan: Redo AVR and CABG to RCA.  TEE 09/11/2019  1. Left ventricular ejection fraction, by estimation, is 55 to 60%. The  left ventricle has normal function. The left ventricle has no regional  wall motion abnormalities. There is mild concentric left ventricular  hypertrophy.  2. Right ventricular systolic function is normal. The right ventricular  size is mildly enlarged. There is mildly elevated pulmonary artery  systolic pressure. The estimated right ventricular systolic pressure is  61.4 mmHg.  3. It appears that the left atrial appendage has been surgically resected  or oversown. Left atrial size was severely dilated. No left atrial/left  atrial appendage thrombus was  detected. The LAA emptying velocity was 35  cm/s.  4. Right atrial size was severely dilated.  5. The mitral valve is normal in structure. Mild mitral valve  regurgitation.  6. There is a large, moderately mobile vegetation attached to the septal  leaflet of the tricuspid valve, measuring 27 mm in length and 11 mm in  width. It appears heterogeneous and friable.. Tricuspid valve  regurgitation is moderate.  7. There is a large  vegetation attached to the medial aspect of posterior  disc or the posteromedial mechanical valve annulus. It is highly mobile  and prolapses readily across the valve orifice. It measures 15 mm in  length and 6 mm in width. Although  there is no evidence of a periannular abscess cavity, the periannular  tissue echodensity is heterogeneous, raining concern for  inflammation/early abscess formation. The proximity of the aortic and the  tricuspid vegetation to the same area of the  interventricular septum also raises concern for intramyocardial abscess or  fistula formation. The aortic valve has been repaired/replaced. Aortic  valve regurgitation is trivial. Mild aortic valve stenosis. There is a  unknown [employer] tilting disk valve  present in the aortic position. Procedure Date: 2000. Aortic valve mean  gradient measures 20.0 mmHg.  8. There is mild (Grade II) atheroma plaque involving the transverse  aorta.   Patient Profile     54 y.o. 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  Assessment & Plan    1. Endocarditis:  redo aortic valve replacement, probably with cadaveric homograft, rescheduled for May 03. On antibiotics IV. Afebrile, WBC normal, follow-up blood cultures from 04 20/2021 are negative. ESR 124 (09/10/2019), ESR still 122 (09/16/2019). Warfarin has been discontinued and he is on IV heparin, INR 1.8 today.  No clinical signs of AI or CHF, but PR 240 ms is concerning for periannular abscess. PR appears to be stable today.  2.  CAD: He will have redo bypass to the RCA, no other stenotic vessels 3. PAFib:without recent recurrence, on IV heparin.  4. T10 lytic lesion: biopsy today 5. DM: Increased risk of infection/healing problems. Until recently glycemic control was excellent with hemoglobin A1c in the 6-7% range, on current admission up to 8.2%, possibly due to active infection. 6. AKI:  Baseline creatinine 1.1, as high as 1.5 early on during this admission. Now trending up over last 48 h. Watch very closely since he had contrast for cath on 09/16/19 and he is receiving aminoglycosides.      For questions or updates, please contact [place] Please consult [email or web address] for contact info under        Signed, Sanda Klein, MD  09/18/2019, 10:28 AM

## 2019-09-18 NOTE — Sedation Documentation (Signed)
Patient is resting comfortably. 

## 2019-09-18 NOTE — Progress Notes (Addendum)
Pharmacy Antibiotic Note  [name] is a 54 y.o. 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   Pharmacy has been consulted for gentamicin dosing for synergy.   Pt has been on ceftriaxone and gent for his native and prosthetic valve endocarditis with strep infantarius. His gent trough was mistakenly drawn as a peak the other day and it was a little above goal of 3-4. His trough came back at 1.9 tonight. We are shooting for a goal of <1 for the trough. The Rn has not given the dose yet so we are going to reduce the dose and restart in AM. If his scr trends up, we will extend out the interval.   Plan: Change gent to 220mg  IV q24 Continue ceftriaxone 2 gm every 24 hours Monitor strep susceptibilities, management plan, renal function  Monitor gentamicin trough at steady state (Goal <1)   Height: 6\' 2"  (188 cm) Weight: 131.5 kg (289 lb 12.8 oz) IBW/kg (Calculated) : 82.2  Temp (24hrs), Avg:98.1 F (36.7 C), Min:98 F (36.7 C), Max:98.2 F (36.8 C)  Recent Labs  Lab 09/14/19 0909 09/15/19 0530 09/15/19 1927 09/16/19 0754 09/17/19 0400 09/18/19 0642 09/18/19 1851  WBC 9.2 8.6  --  9.9 10.2 9.7  --   CREATININE 1.21 1.21  --  1.17 1.23 1.38*  --   GENTTROUGH  --   --  5.5*  --   --   --  1.9    Estimated Creatinine Clearance: 88.2 mL/min (A) (by C-G formula based on SCr of 1.38 mg/dL (H)).    Allergies  Allergen Reactions  . Diltiazem Hives  . Insulin Glargine Swelling    edema    Antimicrobials this admission: 4/19 Vanc>> 4/21 4/19 ceftriaxone >> 4/21 gentamicin>>   Microbiology results: 4/19 BCx: GPCs in clusters 2/2>>STREPTOCOCCUS INFANTARIUS  4/20: bld x2 - ngtdF  Onnie Boer, PharmD, BCIDP, AAHIVP, CPP Infectious Disease Pharmacist 09/18/2019 8:15 PM

## 2019-09-18 NOTE — Procedures (Signed)
INTERVENTIONAL NEURORADIOLOGY BRIEF POSTPROCEDURE NOTE  Attending: Dr. Pedro Earls  Assistant: None  Diagnosis: T10 lesion  Access site: Percutaneous right transpedicular.  Anesthesia: Moderate sedation  Medication used: 3 mg Versed IV; 150 mcg Fentanyl IV.  Complications: None  Estimated blood loss: Minimal  Specimen: FNA samples and core biopsy.  Findings: Mild compression of the superior endplate and right pedicular undefined contour related to tumor seen on MRI.  The patient tolerated the procedure well without incident or complication and is in stable condition.

## 2019-09-19 ENCOUNTER — Encounter (HOSPITAL_COMMUNITY): Payer: Medicare PPO

## 2019-09-19 DIAGNOSIS — R0989 Other specified symptoms and signs involving the circulatory and respiratory systems: Secondary | ICD-10-CM | POA: Diagnosis not present

## 2019-09-19 DIAGNOSIS — I361 Nonrheumatic tricuspid (valve) insufficiency: Secondary | ICD-10-CM

## 2019-09-19 LAB — BASIC METABOLIC PANEL
Anion gap: 8 (ref 5–15)
BUN: 12 mg/dL (ref 6–20)
CO2: 23 mmol/L (ref 22–32)
Calcium: 8.4 mg/dL — ABNORMAL LOW (ref 8.9–10.3)
Chloride: 107 mmol/L (ref 98–111)
Creatinine, Ser: 1.38 mg/dL — ABNORMAL HIGH (ref 0.61–1.24)
GFR calc Af Amer: >60 mL/min (ref >60)
GFR calc non Af Amer: 58 mL/min — ABNORMAL LOW (ref >60)
Glucose, Bld: 191 mg/dL — ABNORMAL HIGH (ref 70–99)
Potassium: 3.5 mmol/L (ref 3.5–5.1)
Sodium: 138 mmol/L (ref 135–145)

## 2019-09-19 LAB — CBC
HCT: 25.6 % — ABNORMAL LOW (ref 39.0–52.0)
Hemoglobin: 8.7 g/dL — ABNORMAL LOW (ref 13.0–17.0)
MCH: 28.2 pg (ref 26.0–34.0)
MCHC: 34 g/dL (ref 30.0–36.0)
MCV: 83.1 fL (ref 80.0–100.0)
Platelets: 195 10*3/uL (ref 150–400)
RBC: 3.08 MIL/uL — ABNORMAL LOW (ref 4.22–5.81)
RDW: 14.9 % (ref 11.5–15.5)
WBC: 7.1 10*3/uL (ref 4.0–10.5)
nRBC: 0 % (ref 0.0–0.2)

## 2019-09-19 LAB — GLUCOSE, CAPILLARY
Glucose-Capillary: 126 mg/dL — ABNORMAL HIGH (ref 70–99)
Glucose-Capillary: 103 mg/dL — ABNORMAL HIGH (ref 70–99)
Glucose-Capillary: 171 mg/dL — ABNORMAL HIGH (ref 70–99)
Glucose-Capillary: 160 mg/dL — ABNORMAL HIGH (ref 70–99)

## 2019-09-19 LAB — HEPARIN LEVEL (UNFRACTIONATED)
Heparin Unfractionated: 0.17 IU/mL — ABNORMAL LOW (ref 0.30–0.70)
Heparin Unfractionated: 0.37 IU/mL (ref 0.30–0.70)
Heparin Unfractionated: 0.56 IU/mL (ref 0.30–0.70)
Heparin Unfractionated: 0.5 IU/mL (ref 0.30–0.70)

## 2019-09-19 LAB — PROTIME-INR
INR: 1.7 — ABNORMAL HIGH (ref 0.8–1.2)
Prothrombin Time: 19.7 seconds — ABNORMAL HIGH (ref 11.4–15.2)

## 2019-09-19 LAB — METHYLMALONIC ACID, SERUM: Methylmalonic Acid, Quantitative: 204 nmol/L (ref 0–378)

## 2019-09-19 NOTE — Progress Notes (Signed)
[place] [facility] for Warfarin>>Heparin Indication: atrial fibrillation + mechanical AVR  Patient Measurements: Height: 6\' 2"  (188 cm) Weight: 131.7 kg (290 lb 6.4 oz) IBW/kg (Calculated) : 82.2  Heparin weight: 131.5 kg  Vital Signs: Temp: 98 F (36.7 C) (04/29 1245) Temp Source: Oral (04/29 1245) BP: 137/90 (04/29 1245) Pulse Rate: 85 (04/29 1245)  Labs: Recent Labs    09/17/19 0400 09/17/19 0400 09/18/19 0642 09/19/19 0320 09/19/19 0745  HGB 9.2*   < > 9.4* 8.7*  --   HCT 26.5*  --  27.1* 25.6*  --   PLT 226  --  246 195  --   LABPROT 18.9*  --  20.2* 19.7*  --   INR 1.6*  --  1.8* 1.7*  --   HEPARINUNFRC  --   --  0.36 0.17* 0.37  CREATININE 1.23  --  1.38*  --   --    < > = values in this interval not displayed.   Estimated Creatinine Clearance: 88.3 mL/min (A) (by C-G formula based on SCr of 1.38 mg/dL (H)).  Assessment: 54 yr old male on warfarin PTA for a fib and mechanical AVR (INR goal 2.5- 3.5). Vegetation seen on TTE this admission. Warfarin is being held and bridged with IV heparin with impending need for procedures. He is S/P cath 4/26,  teeth extraction 4/27 and T10 lesion biopsy 4/28. Heparin was restarted 4/29. Noted plans for  CABG and redo AVR on 5/3.  Heparin level ~7 hrs after heparin infusion was increased to 2050 units/hr earlier today was 0.56 unit/ml, which is within the goal range for this pt. H/H 8.7/25.6, plt 195 (all trending down from yesterday); INR 1.7 today. Per RN, no issues with IV or bleeding observed.  Given mech AVR and no bleeding post procedures, will target higher end of HL goal range.   Warfarin PTA dosing: 5 mg MWF, 7.5 mg all other days   Goal of Therapy:  Heparin level 0.3-0.7 units/mL (0.5 -0.7 if no bleeding) Monitor platelets by anticoagulation protocol: Yes   Plan:  Continue heparin at 2050 units/hr Check 6-hr heparin level Monitor daily heparin level, CBC Monitor for signs/symptoms of  bleeding CABG + AVR planned 5/3  Gillermina Hu, PharmD, BCPS, [facility] Clinical Pharmacist 09/19/19, 17:18 PM

## 2019-09-19 NOTE — Progress Notes (Signed)
PROGRESS NOTE    [name]  [id number] DOB: 1965/12/12 DOA: 09/08/2019 PCP: Biagio Borg, MD   Brief Narrative:  54 y.o.malewith history of CAD s/p CABG in 2000, SVG-RCA PCI with BMS in Feb 2015, he had re-do PCI in June 2019 DES to SVG graft, ascending aortic dissection in 2000 s/p repair with AVR on warfarin. Paroxysmal A. fib. NIDDM-2, HTN and HLD presenting with 5 days of chills, nausea, vomiting, diarrhea and dyspnea on exertion and orthopnea mainly with lying on the left side. He's been found to have infective endocarditis with strep infantarius bacteremia.  He had a TEE showing large vegetations on the septal leaflet of the tricuspid valve and on the posterior disc of the aortic valve.  He's on ceftriaxone/gentamicin for his endocarditis at this point.  ID and cardiology are following.  He was seen by dental medicine 4/27 for multiple tooth extraction, alveoloplasty, debridement.  He's also been seen by surgery for a left buttocks abscess which has spontaneously drained and by orthopedics for L MTP pain and swelling which is thought to be gout.  He had an incidental aggressive appearing lesion in T10 which will be biopsied on 4/28 by IR, neurosurgery is also following for this lesion.  Dr. Cyndia Bent from [facility] planning for surgery on Monday 5/3.   Assessment & Plan:   Principal Problem:   Suspected endocarditis mechanical aortic valve Active Problems:   Diabetes mellitus type II, non insulin dependent (HCC)   Hyperlipidemia   Essential hypertension   PAF (paroxysmal atrial fibrillation) (HCC)   Hypothyroidism   Normocytic anemia   Shortness of breath   Obesity (BMI 30-39.9)   CAD (coronary artery disease)   Nausea vomiting and diarrhea   Gram-positive cocci bacteremia   Endocarditis of tricuspid valve   Infective endocarditis -tricuspid vegetation with regurgitation strep Infantarius Bacteremia:  - blood cx 4/19 strep infantarius intermediate to penicillin -  blood cx 4/20 cx NGTD - ID consulted, appreciate recommendations - continue ceftriaxone/gentamicin -Eventually will need outpatient colonoscopy - TEE with large vegetations on septal leaflet of the tricuspid valve and on the posterior disc of the mechanical aortic valve (see report) -Repeat cultures negative, PICC in place - orthopantogram with dental caries and periodontal disease 4/21 -CT surgery tentatively planned 5/3 - 4/27 s/p multiple tooth extraction, alveoloplasty, debridement by dentistry - cardiac catheterization 4/26 -> with single vessel occlusive CAD with CTO of the ostium of the RCA, SVG to RCA with 80% in stent stenosis at the ostium, mildly elevated LV filling pressures, mild pulm HTN -> plan for redo AVR and CABG to RCA -IR-T10 spinal lesion biopsy performed 4/28-results pending  Left Buttocks Abscess: Spontaneous drainage.  Seen by general surgery.  Flagyl discontinued 4/29.  Routine dressing per surgery and wound care  Left MTP Pain, concerning for gout continue colchicine.  Seen by orthopedic no further recommendations.  Aggressive appearing lesion in T10 vertebral body: Status post biopsy 4/28-results pending Seen by neurosurgery  Nausea/vomiting/diarrhea: GI panel is negative.  Supportive management.  Iron Def Anemia:  Iron supplements with bowel regimen B12, folate-normal  CAD s/p CABG in 2000, SVG-RCA PCI with BMS in Feb 2015, he had re-do PCI in June 2019 DES to SVG graft: Echocardiogram as above. -Continue homeASA andstatin.  -Plavix on hold, last dose 4/19 -Metoprolol, losartan and Lasix on hold due to AKI and soft blood pressures  Ascending aortic dissection in 2000 s/p repair with Mechanical AVR on warfarin.  -Heparin drip resumed -Monitor fluid status  and renal function.  Paroxysmal A. fib. Rate controlled on metoprolol. -Metoprolol on hold per cardiology. -Heparin drip  Uncontrolled NIDDM-2 with hyperglycemia: A1c  8.2%. -ContinueSSI-moderate -Levemir 6 units twice daily, NovoLog 6 units 3 times daily -Continue statin. Check lipid panel  CKD stage IIIa (vancomycin, Lasix and losartan) -Baseline creatinine 1.2-1.3. -Holding metoprolol, Lasix and losartan -Continue monitoring  Essential HTN: -metoprolol, amlodipine and losartanon hold.  BP's reasonable  HLD  -Continue atorvastatin.  Hypothyroidism: -Continue home Synthroid.   Morbid obesity:Body mass index is 36.03 kg/m.With comorbidities as above. -Encourage lifestyle change to lose weight.  Would benefit from outpatient sleep study   DVT prophylaxis: Heparin drip Code Status: Full code Family Communication: None at bedside Disposition Plan:   Patient From= home  Patient Anticipated D/C place= to be determined  Barriers= plan CT surgery early next week, maintain hospital stay    Subjective: Feels okay no complaints  Review of Systems Otherwise negative except as per HPI, including: General: Denies fever, chills, night sweats or unintended weight loss. Resp: Denies cough, wheezing, shortness of breath. Cardiac: Denies chest pain, palpitations, orthopnea, paroxysmal nocturnal dyspnea. GI: Denies abdominal pain, nausea, vomiting, diarrhea or constipation GU: Denies dysuria, frequency, hesitancy or incontinence MS: Denies muscle aches, joint pain or swelling Neuro: Denies headache, neurologic deficits (focal weakness, numbness, tingling), abnormal gait Psych: Denies anxiety, depression, SI/HI/AVH Skin: Denies new rashes or lesions ID: Denies sick contacts, exotic exposures, travel Examination:  Constitutional: Not in acute distress Respiratory: Clear to auscultation bilaterally Cardiovascular: Normal sinus rhythm, no rubs Abdomen: Nontender nondistended good bowel sounds Musculoskeletal: No edema noted Skin: No rashes seen Neurologic: CN 2-12 grossly intact.  And nonfocal Psychiatric: Normal judgment and  insight. Alert and oriented x 3. Normal mood.  Objective: Vitals:   09/18/19 2212 09/19/19 0614 09/19/19 0617 09/19/19 0840  BP: 130/82  129/85   Pulse: 82  79   Resp: 20  17   Temp: 98.6 F (37 C)  98.1 F (36.7 C)   TempSrc: Oral  Oral   SpO2: 94%  99% 100%  Weight:  131.7 kg    Height:        Intake/Output Summary (Last 24 hours) at 09/19/2019 1122 Last data filed at 09/19/2019 0800 Gross per 24 hour  Intake 1047.15 ml  Output 1075 ml  Net -27.85 ml   Filed Weights   09/17/19 0321 09/18/19 0453 09/19/19 0614  Weight: 131.5 kg 131.5 kg 131.7 kg     Data Reviewed:   CBC: Recent Labs  Lab 09/13/19 0424 09/13/19 0424 09/14/19 0909 09/14/19 0909 09/15/19 0530 09/15/19 0530 09/16/19 0754 09/16/19 0936 09/17/19 0400 09/18/19 0642 09/19/19 0320  WBC 7.7   < > 9.2   < > 8.6  --  9.9  --  10.2 9.7 7.1  NEUTROABS 5.7  --  7.0  --  6.2  --   --   --  7.8*  --   --   HGB 8.9*   < > 9.8*   < > 8.9*   < > 9.3* 8.2*  8.5* 9.2* 9.4* 8.7*  HCT 25.4*   < > 27.6*   < > 25.0*   < > 26.0* 24.0*  25.0* 26.5* 27.1* 25.6*  MCV 80.9   < > 81.2   < > 82.0  --  81.8  --  81.5 83.6 83.1  PLT 200   < > 230   < > 212  --  219  --  226 246 195   < > =  values in this interval not displayed.   Basic Metabolic Panel: Recent Labs  Lab 09/13/19 0424 09/13/19 0424 09/14/19 0339 09/14/19 0909 09/14/19 0909 09/15/19 0530 09/16/19 0754 09/16/19 0936 09/17/19 0400 09/18/19 0642  NA 138   < >  --  136   < > 137 136 139  140 138 137  K 3.7   < >  --  3.8   < > 3.8 3.8 3.7  3.7 4.0 3.8  CL 107   < >  --  105  --  106 106  --  108 106  CO2 23   < >  --  22  --  23 23  --  20* 24  GLUCOSE 160*   < >  --  249*  --  142* 164*  --  143* 143*  BUN 13   < >  --  15  --  13 12  --  10 13  CREATININE 1.01   < >  --  1.21  --  1.21 1.17  --  1.23 1.38*  CALCIUM 8.4*   < >  --  8.7*  --  8.4* 8.4*  --  8.4* 8.7*  MG 1.7  --  1.7  --   --  1.7  --   --  1.5* 2.0  PHOS 3.3  --  3.7  --   --   3.7  --   --  3.2 3.5   < > = values in this interval not displayed.   GFR: Estimated Creatinine Clearance: 88.3 mL/min (A) (by C-G formula based on SCr of 1.38 mg/dL (H)). Liver Function Tests: Recent Labs  Lab 09/13/19 0424 09/14/19 0339 09/15/19 0530 09/17/19 0400 09/18/19 0642  AST 19 21 21 23 22   ALT 12 12 13 12 11   ALKPHOS 64 70 67 67 68  BILITOT 0.9 0.8 1.1 1.0 0.7  PROT 6.5 6.8 6.6 7.5 7.7  ALBUMIN 2.3* 2.3* 2.4* 2.6* 2.7*   No results for input(s): LIPASE, AMYLASE in the last 168 hours. No results for input(s): AMMONIA in the last 168 hours. Coagulation Profile: Recent Labs  Lab 09/15/19 0530 09/16/19 0302 09/17/19 0400 09/18/19 0642 09/19/19 0320  INR 2.0* 1.7* 1.6* 1.8* 1.7*   Cardiac Enzymes: No results for input(s): CKTOTAL, CKMB, CKMBINDEX, TROPONINI in the last 168 hours. BNP (last 3 results) No results for input(s): PROBNP in the last 8760 hours. HbA1C: No results for input(s): HGBA1C in the last 72 hours. CBG: Recent Labs  Lab 09/18/19 0809 09/18/19 1214 09/18/19 1825 09/18/19 2215 09/19/19 0752  GLUCAP 115* 110* 97 233* 126*   Lipid Profile: No results for input(s): CHOL, HDL, LDLCALC, TRIG, CHOLHDL, LDLDIRECT in the last 72 hours. Thyroid Function Tests: No results for input(s): TSH, T4TOTAL, FREET4, T3FREE, THYROIDAB in the last 72 hours. Anemia Panel: No results for input(s): VITAMINB12, FOLATE, FERRITIN, TIBC, IRON, RETICCTPCT in the last 72 hours. Sepsis Labs: No results for input(s): PROCALCITON, LATICACIDVEN in the last 168 hours.  Recent Results (from the past 240 hour(s))  Gastrointestinal Panel by PCR , Stool     Status: None   Collection Time: 09/09/19 12:21 PM   Specimen: Stool  Result Value Ref Range Status   Campylobacter species NOT DETECTED NOT DETECTED Final   Plesimonas shigelloides NOT DETECTED NOT DETECTED Final   Salmonella species NOT DETECTED NOT DETECTED Final   Yersinia enterocolitica NOT DETECTED NOT DETECTED  Final   Vibrio species NOT DETECTED NOT DETECTED Final  Vibrio cholerae NOT DETECTED NOT DETECTED Final   Enteroaggregative E coli (EAEC) NOT DETECTED NOT DETECTED Final   Enteropathogenic E coli (EPEC) NOT DETECTED NOT DETECTED Final   Enterotoxigenic E coli (ETEC) NOT DETECTED NOT DETECTED Final   Shiga like toxin producing E coli (STEC) NOT DETECTED NOT DETECTED Final   Shigella/Enteroinvasive E coli (EIEC) NOT DETECTED NOT DETECTED Final   Cryptosporidium NOT DETECTED NOT DETECTED Final   Cyclospora cayetanensis NOT DETECTED NOT DETECTED Final   Entamoeba histolytica NOT DETECTED NOT DETECTED Final   Giardia lamblia NOT DETECTED NOT DETECTED Final   Adenovirus F40/41 NOT DETECTED NOT DETECTED Final   Astrovirus NOT DETECTED NOT DETECTED Final   Norovirus GI/GII NOT DETECTED NOT DETECTED Final   Rotavirus A NOT DETECTED NOT DETECTED Final   Sapovirus (I, II, IV, and V) NOT DETECTED NOT DETECTED Final    Comment: Performed at [facility], [address] [place], [place] [address]  Culture, blood (Routine X 2) w Reflex to ID Panel     Status: Abnormal   Collection Time: 09/09/19  1:12 PM   Specimen: BLOOD  Result Value Ref Range Status   Specimen Description BLOOD RIGHT ANTECUBITAL  Final   Special Requests   Final    BOTTLES DRAWN AEROBIC AND ANAEROBIC Blood Culture adequate volume Performed at [facility] [address] [place], [place] [address]    Culture  Setup Time   Final    GRAM POSITIVE COCCI ANAEROBIC BOTTLE ONLY CRITICAL RESULT CALLED TO, READ BACK BY AND VERIFIED WITH: PHARMD SEAY LAURA AT 0546  BY MESSAN HOUEGNIFIO ON 09/10/2019     Culture STREPTOCOCCUS INFANTARIUS (A)  Final   Report Status 09/12/2019 FINAL  Final   Organism ID, Bacteria STREPTOCOCCUS INFANTARIUS  Final      Susceptibility   Streptococcus infantarius - MIC*    PENICILLIN 0.25 INTERMEDIATE Intermediate     CEFTRIAXONE 0.25 SENSITIVE Sensitive     ERYTHROMYCIN <=0.12 SENSITIVE  Sensitive     LEVOFLOXACIN 2 SENSITIVE Sensitive     VANCOMYCIN <=0.12 SENSITIVE Sensitive     * STREPTOCOCCUS INFANTARIUS  Culture, blood (Routine X 2) w Reflex to ID Panel     Status: Abnormal   Collection Time: 09/09/19  1:12 PM   Specimen: BLOOD LEFT HAND  Result Value Ref Range Status   Specimen Description BLOOD LEFT HAND  Final   Special Requests   Final    BOTTLES DRAWN AEROBIC AND ANAEROBIC Blood Culture adequate volume   Culture  Setup Time   Final    AEROBIC BOTTLE ONLY GRAM POSITIVE COCCI IN CHAINS GRAM POSITIVE COCCI IN CLUSTERS CRITICAL VALUE NOTED.  VALUE IS CONSISTENT WITH PREVIOUSLY REPORTED AND CALLED VALUE.    Culture (A)  Final    STREPTOCOCCUS INFANTARIUS SUSCEPTIBILITIES PERFORMED ON PREVIOUS CULTURE WITHIN THE LAST 5 DAYS. Performed at [facility], [place] [address], [place], [place] [address]    Report Status 09/12/2019 FINAL  Final  Blood Culture ID Panel (Reflexed)     Status: Abnormal   Collection Time: 09/09/19  1:12 PM  Result Value Ref Range Status   Enterococcus species NOT DETECTED NOT DETECTED Final   Listeria monocytogenes NOT DETECTED NOT DETECTED Final   Staphylococcus species NOT DETECTED NOT DETECTED Final   Staphylococcus aureus (BCID) NOT DETECTED NOT DETECTED Final   Streptococcus species DETECTED (A) NOT DETECTED Final    Comment: Not Enterococcus species, Streptococcus agalactiae, Streptococcus pyogenes, or Streptococcus pneumoniae. CRITICAL  RESULT CALLED TO, READ BACK BY AND VERIFIED WITH: Criss Rosales PharmD 12:50 09/10/19 ([email or web address])    Streptococcus agalactiae NOT DETECTED NOT DETECTED Final   Streptococcus pneumoniae NOT DETECTED NOT DETECTED Final   Streptococcus pyogenes NOT DETECTED NOT DETECTED Final   Acinetobacter baumannii NOT DETECTED NOT DETECTED Final   Enterobacteriaceae species NOT DETECTED NOT DETECTED Final   Enterobacter cloacae complex NOT DETECTED NOT DETECTED Final   Escherichia coli NOT DETECTED NOT DETECTED Final    Klebsiella oxytoca NOT DETECTED NOT DETECTED Final   Klebsiella pneumoniae NOT DETECTED NOT DETECTED Final   Proteus species NOT DETECTED NOT DETECTED Final   Serratia marcescens NOT DETECTED NOT DETECTED Final   Haemophilus influenzae NOT DETECTED NOT DETECTED Final   Neisseria meningitidis NOT DETECTED NOT DETECTED Final   Pseudomonas aeruginosa NOT DETECTED NOT DETECTED Final   Candida albicans NOT DETECTED NOT DETECTED Final   Candida glabrata NOT DETECTED NOT DETECTED Final   Candida krusei NOT DETECTED NOT DETECTED Final   Candida parapsilosis NOT DETECTED NOT DETECTED Final   Candida tropicalis NOT DETECTED NOT DETECTED Final    Comment: Performed at [facility], [place] [address] [place], [place] [address]  Culture, blood (routine x 2)     Status: None   Collection Time: 09/10/19  2:46 PM   Specimen: BLOOD RIGHT FOREARM  Result Value Ref Range Status   Specimen Description BLOOD RIGHT FOREARM  Final   Special Requests   Final    BOTTLES DRAWN AEROBIC AND ANAEROBIC Blood Culture adequate volume   Culture   Final    NO GROWTH 5 DAYS Performed at [facility], [place] [address], [place], [place] [address]    Report Status 09/15/2019 FINAL  Final  Culture, blood (routine x 2)     Status: None   Collection Time: 09/10/19  2:51 PM   Specimen: BLOOD RIGHT WRIST  Result Value Ref Range Status   Specimen Description BLOOD RIGHT WRIST  Final   Special Requests   Final    BOTTLES DRAWN AEROBIC ONLY Blood Culture adequate volume   Culture   Final    NO GROWTH 5 DAYS Performed at [facility], [place] [address], [place], [place] [address]    Report Status 09/15/2019 FINAL  Final  Surgical PCR screen     Status: None   Collection Time: 09/16/19  3:34 PM   Specimen: Nasal Mucosa; Nasal Swab  Result Value Ref Range Status   MRSA, PCR NEGATIVE NEGATIVE Final   Staphylococcus aureus NEGATIVE NEGATIVE Final    Comment: (NOTE) The Xpert SA Assay (FDA approved for NASAL  specimens in patients 76 years of age and older), is one component of a comprehensive surveillance program. It is not intended to diagnose infection nor to guide or monitor treatment. Performed at [facility], [place] [address], [place], [place] [address]          Radiology Studies: No results found.      Scheduled Meds: . aspirin EC  81 mg Oral Daily  . atorvastatin  40 mg Oral Q breakfast  . colchicine  0.6 mg Oral BID  . feeding supplement  1 Container Oral TID BM  . feeding supplement (PRO-STAT SUGAR FREE 64)  30 mL Oral BID  . gabapentin  300 mg Oral Q breakfast  . insulin aspart  0-15 Units Subcutaneous TID WC  . insulin aspart  0-5 Units Subcutaneous QHS  . insulin aspart  6  Units Subcutaneous TID WC  . insulin detemir  6 Units Subcutaneous BID  . levothyroxine  50 mcg Oral Q0600  . mupirocin ointment  1 application Nasal BID  . polyethylene glycol  17 g Oral BID  . sodium chloride flush  3 mL Intravenous Once  . sodium chloride flush  3 mL Intravenous Q12H   Continuous Infusions: . sodium chloride    . cefTRIAXone (ROCEPHIN)  IV 2 g (09/19/19 1030)  . gentamicin Stopped (09/19/19 [id number])  . heparin 2,050 Units/hr (09/19/19 0845)  . lactated ringers       LOS: 11 days   Time spent=35 mins    Amedee Cerrone Arsenio Loader, MD [facility]  If 7PM-7AM, please contact night-coverage  09/19/2019, 11:22 AM

## 2019-09-19 NOTE — Progress Notes (Addendum)
Interventional Radiology Brief Note:  Patient s/p bone biopsy of T10 lytic lesion yesterday with Dr. Karenann Cai.   He continues work-up and intervention for his multiple medical conditions including bacteremia, left buttock abscess, and infective endocarditis which will require surgery next week.   Patient would likely benefit from [facility] ablation of his T10 lesion.  Path pending.   Please place an outpatient order for IR Bone Tumor Ablation at discharge if warranted.   Brynda Greathouse, MS RD PA-C 11:52 AM

## 2019-09-19 NOTE — Progress Notes (Signed)
Progress Note  Patient Name: [name] Date of Encounter: 09/19/2019  Primary Cardiologist: Kirk Ruths, MD   Subjective   Has mid back pain, not any worse after the biopsy.  Inpatient Medications    Scheduled Meds: . aspirin EC  81 mg Oral Daily  . atorvastatin  40 mg Oral Q breakfast  . colchicine  0.6 mg Oral BID  . feeding supplement  1 Container Oral TID BM  . feeding supplement (PRO-STAT SUGAR FREE 64)  30 mL Oral BID  . gabapentin  300 mg Oral Q breakfast  . insulin aspart  0-15 Units Subcutaneous TID WC  . insulin aspart  0-5 Units Subcutaneous QHS  . insulin aspart  6 Units Subcutaneous TID WC  . insulin detemir  6 Units Subcutaneous BID  . levothyroxine  50 mcg Oral Q0600  . mupirocin ointment  1 application Nasal BID  . polyethylene glycol  17 g Oral BID  . sodium chloride flush  3 mL Intravenous Once  . sodium chloride flush  3 mL Intravenous Q12H   Continuous Infusions: . sodium chloride    . cefTRIAXone (ROCEPHIN)  IV 2 g (09/19/19 1030)  . gentamicin Stopped (09/19/19 3762)  . heparin 2,050 Units/hr (09/19/19 0845)  . lactated ringers     PRN Meds: sodium chloride, acetaminophen **OR** acetaminophen, HYDROcodone-acetaminophen, nitroGLYCERIN, ondansetron **OR** ondansetron (ZOFRAN) IV, sodium chloride flush   Vital Signs    Vitals:   09/19/19 0614 09/19/19 0617 09/19/19 0840 09/19/19 1245  BP:  129/85  137/90  Pulse:  79  85  Resp:  17  19  Temp:  98.1 F (36.7 C)  98 F (36.7 C)  TempSrc:  Oral  Oral  SpO2:  99% 100% 97%  Weight: 131.7 kg     Height:        Intake/Output Summary (Last 24 hours) at 09/19/2019 1627 Last data filed at 09/19/2019 1500 Gross per 24 hour  Intake 1167.15 ml  Output 1675 ml  Net -507.85 ml   Last 3 Weights 09/19/2019 09/18/2019 09/17/2019  Weight (lbs) 290 lb 6.4 oz 289 lb 12.8 oz 289 lb 14.4 oz  Weight (kg) 131.725 kg 131.452 kg 131.498 kg      Telemetry    NSR, 1st deg AV block, not much changed  at 240 ms - Personally Reviewed  ECG    No new tracing - Personally Reviewed  Physical Exam   GEN: No acute distress.   Neck: No JVD Cardiac: RRR, crisp mechanical valve clicks, no diastolic murmurs, rubs, or gallops.  Respiratory: Clear to auscultation bilaterally. GI: Soft, nontender, non-distended  MS: No edema; No deformity. Neuro:  Nonfocal  Psych: Normal affect   Labs    High Sensitivity Troponin:   Recent Labs  Lab 09/08/19 0810 09/08/19 1100  TROPONINIHS 34* 28*      Chemistry Recent Labs  Lab 09/15/19 0530 09/15/19 0530 09/16/19 0754 09/16/19 0754 09/16/19 0936 09/17/19 0400 09/18/19 0642  NA 137   < > 136   < > 139  140 138 137  K 3.8   < > 3.8   < > 3.7  3.7 4.0 3.8  CL 106   < > 106  --   --  108 106  CO2 23   < > 23  --   --  20* 24  GLUCOSE 142*   < > 164*  --   --  143* 143*  BUN 13   < > 12  --   --  10 13  CREATININE 1.21   < > 1.17  --   --  1.23 1.38*  CALCIUM 8.4*   < > 8.4*  --   --  8.4* 8.7*  PROT 6.6  --   --   --   --  7.5 7.7  ALBUMIN 2.4*  --   --   --   --  2.6* 2.7*  AST 21  --   --   --   --  23 22  ALT 13  --   --   --   --  12 11  ALKPHOS 67  --   --   --   --  67 68  BILITOT 1.1  --   --   --   --  1.0 0.7  GFRNONAA >60   < > >60  --   --  >60 58*  GFRAA >60   < > >60  --   --  >60 >60  ANIONGAP 8   < > 7  --   --  10 7   < > = values in this interval not displayed.     Hematology Recent Labs  Lab 09/17/19 0400 09/18/19 0642 09/19/19 0320  WBC 10.2 9.7 7.1  RBC 3.25* 3.24* 3.08*  HGB 9.2* 9.4* 8.7*  HCT 26.5* 27.1* 25.6*  MCV 81.5 83.6 83.1  MCH 28.3 29.0 28.2  MCHC 34.7 34.7 34.0  RDW 14.6 14.7 14.9  PLT 226 246 195    BNPNo results for input(s): BNP, PROBNP in the last 168 hours.   DDimer No results for input(s): DDIMER in the last 168 hours.   Radiology    No results found.  Cardiac Studies   CATH today  Ost RCA to Prox RCA lesion is 100% stenosed.  SVG to RVA with Origin lesion is 80%  stenosed in stent.  Hemodynamic findings consistent with mild pulmonary hypertension.  1. Single vessel occlusive CAD with CTO of the ostium of the RCA 2. SVG to RCA with 80% in stent stenosis at the ostium. The RCA is a large vessel. 3. Mildly elevated LV filling pressures 4. Mild pulmonary HTN 5. Normal cardiac output.   Plan: Redo AVR and CABG to RCA.  TEE 09/11/2019  1. Left ventricular ejection fraction, by estimation, is 55 to 60%. The  left ventricle has normal function. The left ventricle has no regional  wall motion abnormalities. There is mild concentric left ventricular  hypertrophy.  2. Right ventricular systolic function is normal. The right ventricular  size is mildly enlarged. There is mildly elevated pulmonary artery  systolic pressure. The estimated right ventricular systolic pressure is  56.2 mmHg.  3. It appears that the left atrial appendage has been surgically resected  or oversown. Left atrial size was severely dilated. No left atrial/left  atrial appendage thrombus was detected. The LAA emptying velocity was 35  cm/s.  4. Right atrial size was severely dilated.  5. The mitral valve is normal in structure. Mild mitral valve  regurgitation.  6. There is a large, moderately mobile vegetation attached to the septal  leaflet of the tricuspid valve, measuring 27 mm in length and 11 mm in  width. It appears heterogeneous and friable.. Tricuspid valve  regurgitation is moderate.  7. There is a large vegetation attached to the medial aspect of posterior  disc or the posteromedial mechanical valve annulus. It is highly mobile  and prolapses readily across the valve orifice. It measures 15 mm in  length and 6 mm in width. Although  there is no evidence of a periannular abscess cavity, the periannular  tissue echodensity is heterogeneous, raining concern for  inflammation/early abscess formation. The proximity of the aortic and the  tricuspid vegetation  to the same area of the  interventricular septum also raises concern for intramyocardial abscess or  fistula formation. The aortic valve has been repaired/replaced. Aortic  valve regurgitation is trivial. Mild aortic valve stenosis. There is a  unknown [employer] tilting disk valve  present in the aortic position. Procedure Date: 2000. Aortic valve mean  gradient measures 20.0 mmHg.  8. There is mild (Grade II) atheroma plaque involving the transverse  aorta.   Patient Profile     54 y.o. 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 Additional issues: lytic lesion in his thoracic spine (T10), s/p biopsy on 04/28.Numerous dental problems, likely source of endocarditis, s/p dental extractions 04/27.Gluteal area furuncle, spontaneously drained 4/23, currently packed.  Assessment & Plan      1. Endocarditis: redo aortic valve replacement, probably with cadaveric homograft, scheduled for May 03. On antibiotics IV. Afebrile, WBC normal, follow-up blood cultures from 04 20/2021 are negative. ESR 124 (09/10/2019), ESR still 122 (09/16/2019). Warfarin has been discontinued and he is on IV heparin, INR 1.7 today.  Noclinicalsigns of AI or CHF, but PR 240 ms is concerning for periannular abscess. PR appears to be stable today. Recheck ECG in  AM. 2. CAD: He will have redo bypass to the RCA, no other stenotic vessels 3. PAFib:without recent recurrence, on IV heparin.  4. T10 lytic lesion:biopsy yesterday, hopefully path report tomorrow 5. DM: Increased risk of infection/healing problems. Until recently glycemic control was excellent with hemoglobin A1c in the 6-7% range, on current admission up to 8.2%, possibly due to active infection. 6. AKI:  Baseline creatinine 1.1, as high as 1.5  early on during this admission. Now trending up over last 48 h. Watch very closely since he had contrast for cath on 09/16/19 and he is receiving aminoglycosides.  Labs not sent this AM, asked for a STAT BMET.         For questions or updates, please contact [facility] Please consult [email or web address] for contact info under        Signed, Sanda Klein, MD  09/19/2019, 4:27 PM

## 2019-09-19 NOTE — Progress Notes (Signed)
[place] for warfarin>>heparin Indication: atrial fibrillation + mechanical AVR  Patient Measurements: Height: 6\' 2"  (188 cm) Weight: 131.7 kg (290 lb 6.4 oz) IBW/kg (Calculated) : 82.2  Heparin weight: 131.5 kg  Vital Signs: Temp: 98.1 F (36.7 C) (04/29 0617) Temp Source: Oral (04/29 0617) BP: 129/85 (04/29 0617) Pulse Rate: 79 (04/29 0617)  Labs: Recent Labs    09/17/19 0400 09/17/19 0400 09/18/19 0642 09/19/19 0320 09/19/19 0745  HGB 9.2*   < > 9.4* 8.7*  --   HCT 26.5*  --  27.1* 25.6*  --   PLT 226  --  246 195  --   LABPROT 18.9*  --  20.2* 19.7*  --   INR 1.6*  --  1.8* 1.7*  --   HEPARINUNFRC  --   --  0.36 0.17* 0.37  CREATININE 1.23  --  1.38*  --   --    < > = values in this interval not displayed.   Estimated Creatinine Clearance: 88.3 mL/min (A) (by C-G formula based on SCr of 1.38 mg/dL (H)).  Assessment: 67 YOM on warfarin PTA for Afib and mechanical AVR (INR goal 2.5- 3.5). Vegetation seen on TTE this admission. Warfarin is held and bridged with IV heparin with impending need for procedures. He is s/p cath 4/26,  teeth extraction 4/27 and T10 lesion biopsy 4/28. Heparin restarted 4/29. Noted plans for biopsy on 4/29 and CABG and redo AVR on 5/3  Today's heparin level is therapeutic at 0.37. H/H and plt stable. Per RN, no bleeding and no further procedures until 5/3. Given mech AVR and no bleeding post procedures, will target higher end of HL goal range.   Warfarin PTA dosing: 5mg  MWF, 7.5mg  all other days   Goal of Therapy:  Heparin level 0.3-0.7 units/mL (0.5 -0.7 if no bleeding) Monitor platelets by anticoagulation protocol: Yes   Plan:  Increase heparin to 2050 units/hr F/u 6hr HL  Heparin level daily wth CBC daily CABG + AVR planned 5/3    Benetta Spar, PharmD, BCPS, [facility] Clinical Pharmacist  Please check AMION for all [place] phone numbers After 10:00 PM, call [place]

## 2019-09-19 NOTE — Progress Notes (Signed)
2 Days Post-Op Procedure(s) (LRB): EXTRACTION OF TOOTH #'S [id number] WITH ALVEOLOPLASTY AND GROSS DEBRIDEMENT OF REMAINING TEETH (Bilateral) Subjective: No complaints. Had bx of T10 lesion yesterday. Path pending. Mouth feels fine. No pain in butt.  Objective: Vital signs in last 24 hours: Temp:  [98 F (36.7 C)-98.6 F (37 C)] 98 F (36.7 C) (04/29 1245) Pulse Rate:  [73-85] 85 (04/29 1245) Cardiac Rhythm: Normal sinus rhythm;Heart block (04/29 0840) Resp:  [14-20] 19 (04/29 1245) BP: (118-147)/(78-94) 137/90 (04/29 1245) SpO2:  [94 %-100 %] 97 % (04/29 1245) Weight:  [131.7 kg] 131.7 kg (04/29 0614)  Hemodynamic parameters for last 24 hours:    Intake/Output from previous day: 04/28 0701 - 04/29 0700 In: 656 [P.O.:653; I.V.:3] Out: 1075 [Urine:1075] Intake/Output this shift: Total I/O In: 391.2 [I.V.:135.7; IV Piggyback:255.5] Out: 850 [Urine:850]  General appearance: alert and cooperative Heart: regular rate and rhythm and crisp mechanical valve click Lungs: clear to auscultation bilaterally Extremities: no edema Wound: buttock abscess resolving well. No drainage.  Lab Results: Recent Labs    09/18/19 0642 09/19/19 0320  WBC 9.7 7.1  HGB 9.4* 8.7*  HCT 27.1* 25.6*  PLT 246 195   BMET:  Recent Labs    09/17/19 0400 09/18/19 0642  NA 138 137  K 4.0 3.8  CL 108 106  CO2 20* 24  GLUCOSE 143* 143*  BUN 10 13  CREATININE 1.23 1.38*  CALCIUM 8.4* 8.7*    PT/INR:  Recent Labs    09/19/19 0320  LABPROT 19.7*  INR 1.7*   ABG    Component Value Date/Time   PHART 7.414 09/16/2019 0936   HCO3 23.4 09/16/2019 0936   HCO3 22.8 09/16/2019 0936   TCO2 24 09/16/2019 0936   TCO2 24 09/16/2019 0936   ACIDBASEDEF 1.0 09/16/2019 0936   ACIDBASEDEF 2.0 09/16/2019 0936   O2SAT 72.0 09/16/2019 0936   O2SAT 99.0 09/16/2019 0936   CBG (last 3)  Recent Labs    09/19/19 0752 09/19/19 1134 09/19/19 1540  GLUCAP 126* 103* 171*    Assessment/Plan: S/P  Procedure(s) (LRB): EXTRACTION OF TOOTH #'S [id number] WITH ALVEOLOPLASTY AND GROSS DEBRIDEMENT OF REMAINING TEETH (Bilateral)  He is healing well from extraction of teeth. T10 pathology pending. Plan redo sternotomy for redo aortic root replacement with homograft or porcine root depending on anatomy, tricuspid valve replacment on Monday. I discussed the operative procedure with the patient and his wife including alternatives, benefits and risks; including but not limited to bleeding, blood transfusion, infection, stroke, myocardial infarction, graft failure, heart block requiring a permanent pacemaker, organ dysfunction, and death.  [name] understands and agrees to proceed.     LOS: 11 days    Gaye Pollack 09/19/2019

## 2019-09-19 NOTE — Progress Notes (Signed)
ANTICOAGULATION CONSULT NOTE - Follow Up Consult  Pharmacy Consult for heparin Indication: Afib/AVR  Labs: Recent Labs    09/17/19 0400 09/17/19 0400 09/18/19 0642 09/18/19 0642 09/19/19 0320 09/19/19 0320 09/19/19 0745 09/19/19 1554 09/19/19 1653 09/19/19 2257  HGB 9.2*   < > 9.4*  --  8.7*  --   --   --   --   --   HCT 26.5*  --  27.1*  --  25.6*  --   --   --   --   --   PLT 226  --  246  --  195  --   --   --   --   --   LABPROT 18.9*  --  20.2*  --  19.7*  --   --   --   --   --   INR 1.6*  --  1.8*  --  1.7*  --   --   --   --   --   HEPARINUNFRC  --   --  0.36   < > 0.17*   < > 0.37 0.56  --  0.50  CREATININE 1.23  --  1.38*  --   --   --   --   --  1.38*  --    < > = values in this interval not displayed.    Assessment/Plan:  53yo male remains therapeutic on heparin. Will continue gtt at current rate and monitor daily level.   Wynona Neat, PharmD, BCPS  09/19/2019,11:42 PM

## 2019-09-20 ENCOUNTER — Other Ambulatory Visit: Payer: Self-pay

## 2019-09-20 DIAGNOSIS — R0989 Other specified symptoms and signs involving the circulatory and respiratory systems: Secondary | ICD-10-CM | POA: Diagnosis not present

## 2019-09-20 LAB — BASIC METABOLIC PANEL
Anion gap: 6 (ref 5–15)
BUN: 11 mg/dL (ref 6–20)
CO2: 22 mmol/L (ref 22–32)
Calcium: 8.2 mg/dL — ABNORMAL LOW (ref 8.9–10.3)
Chloride: 111 mmol/L (ref 98–111)
Creatinine, Ser: 1.28 mg/dL — ABNORMAL HIGH (ref 0.61–1.24)
GFR calc Af Amer: >60 mL/min (ref >60)
GFR calc non Af Amer: >60 mL/min (ref >60)
Glucose, Bld: 139 mg/dL — ABNORMAL HIGH (ref 70–99)
Potassium: 3.6 mmol/L (ref 3.5–5.1)
Sodium: 139 mmol/L (ref 135–145)

## 2019-09-20 LAB — GLUCOSE, CAPILLARY
Glucose-Capillary: 145 mg/dL — ABNORMAL HIGH (ref 70–99)
Glucose-Capillary: 232 mg/dL — ABNORMAL HIGH (ref 70–99)
Glucose-Capillary: 115 mg/dL — ABNORMAL HIGH (ref 70–99)
Glucose-Capillary: 161 mg/dL — ABNORMAL HIGH (ref 70–99)

## 2019-09-20 LAB — CBC
HCT: 24.8 % — ABNORMAL LOW (ref 39.0–52.0)
Hemoglobin: 8.8 g/dL — ABNORMAL LOW (ref 13.0–17.0)
MCH: 29.5 pg (ref 26.0–34.0)
MCHC: 35.5 g/dL (ref 30.0–36.0)
MCV: 83.2 fL (ref 80.0–100.0)
Platelets: 195 10*3/uL (ref 150–400)
RBC: 2.98 MIL/uL — ABNORMAL LOW (ref 4.22–5.81)
RDW: 15 % (ref 11.5–15.5)
WBC: 7.7 10*3/uL (ref 4.0–10.5)
nRBC: 0 % (ref 0.0–0.2)

## 2019-09-20 LAB — HEPARIN LEVEL (UNFRACTIONATED): Heparin Unfractionated: 0.58 IU/mL (ref 0.30–0.70)

## 2019-09-20 LAB — PROTIME-INR
INR: 1.7 — ABNORMAL HIGH (ref 0.8–1.2)
Prothrombin Time: 19.6 seconds — ABNORMAL HIGH (ref 11.4–15.2)

## 2019-09-20 MED ORDER — TRANEXAMIC ACID (OHS) BOLUS VIA INFUSION
15.0000 mg/kg | INTRAVENOUS | Status: DC
  Filled 2019-09-20: qty 1988

## 2019-09-20 MED ORDER — MILRINONE LACTATE IN DEXTROSE 20-5 MG/100ML-% IV SOLN
0.3000 ug/kg/min | INTRAVENOUS | Status: DC
  Filled 2019-09-20: qty 100

## 2019-09-20 MED ORDER — MAGNESIUM SULFATE 50 % IJ SOLN
40.0000 meq | INTRAMUSCULAR | Status: DC
  Filled 2019-09-20: qty 9.85

## 2019-09-20 MED ORDER — TRANEXAMIC ACID 1000 MG/10ML IV SOLN
1.5000 mg/kg/h | INTRAVENOUS | Status: DC
  Filled 2019-09-20: qty 25

## 2019-09-20 MED ORDER — DEXMEDETOMIDINE HCL IN NACL 400 MCG/100ML IV SOLN
0.1000 ug/kg/h | INTRAVENOUS | Status: DC
  Filled 2019-09-20: qty 100

## 2019-09-20 MED ORDER — NOREPINEPHRINE 4 MG/250ML-% IV SOLN
0.0000 ug/min | INTRAVENOUS | Status: DC
  Filled 2019-09-20: qty 250

## 2019-09-20 MED ORDER — SODIUM CHLORIDE 0.9 % IV SOLN
INTRAVENOUS | Status: DC
  Filled 2019-09-20: qty 30

## 2019-09-20 MED ORDER — INSULIN REGULAR(HUMAN) IN NACL 100-0.9 UT/100ML-% IV SOLN
INTRAVENOUS | Status: DC
  Filled 2019-09-20: qty 100

## 2019-09-20 MED ORDER — TRANEXAMIC ACID (OHS) PUMP PRIME SOLUTION
2.0000 mg/kg | INTRAVENOUS | Status: DC
  Filled 2019-09-20: qty 2.65

## 2019-09-20 MED ORDER — PLASMA-LYTE 148 IV SOLN
INTRAVENOUS | Status: DC
  Filled 2019-09-20: qty 2.5

## 2019-09-20 MED ORDER — PHENYLEPHRINE HCL-NACL 20-0.9 MG/250ML-% IV SOLN
30.0000 ug/min | INTRAVENOUS | Status: DC
  Filled 2019-09-20: qty 250

## 2019-09-20 MED ORDER — POTASSIUM CHLORIDE 2 MEQ/ML IV SOLN
80.0000 meq | INTRAVENOUS | Status: DC
  Filled 2019-09-20: qty 40

## 2019-09-20 MED ORDER — VANCOMYCIN HCL 1500 MG/300ML IV SOLN
1500.0000 mg | INTRAVENOUS | Status: DC
  Filled 2019-09-20: qty 300

## 2019-09-20 MED ORDER — SODIUM CHLORIDE 0.9 % IV SOLN
750.0000 mg | INTRAVENOUS | Status: DC
  Filled 2019-09-20: qty 750

## 2019-09-20 MED ORDER — SODIUM CHLORIDE 0.9 % IV SOLN
1.5000 g | INTRAVENOUS | Status: DC
  Filled 2019-09-20: qty 1.5

## 2019-09-20 MED ORDER — NITROGLYCERIN IN D5W 200-5 MCG/ML-% IV SOLN
2.0000 ug/min | INTRAVENOUS | Status: DC
  Filled 2019-09-20: qty 250

## 2019-09-20 MED ORDER — EPINEPHRINE HCL 5 MG/250ML IV SOLN IN NS
0.0000 ug/min | INTRAVENOUS | Status: DC
  Filled 2019-09-20: qty 250

## 2019-09-20 NOTE — Progress Notes (Signed)
[name] for warfarin>>heparin Indication: atrial fibrillation + mechanical AVR  Patient Measurements: Height: 6\' 2"  (188 cm) Weight: 132.5 kg (292 lb 1.8 oz) IBW/kg (Calculated) : 82.2  Heparin weight: 131.5 kg  Vital Signs: Temp: 98.5 F (36.9 C) (04/30 0403) Temp Source: Oral (04/30 0403) BP: 141/90 (04/30 0403) Pulse Rate: 80 (04/30 0403)  Labs: Recent Labs    09/17/19 0400 09/17/19 0400 09/18/19 0642 09/19/19 0320 09/19/19 0745  HGB 9.2*   < > 9.4* 8.7*  --   HCT 26.5*  --  27.1* 25.6*  --   PLT 226  --  246 195  --   LABPROT 18.9*  --  20.2* 19.7*  --   INR 1.6*  --  1.8* 1.7*  --   HEPARINUNFRC  --   --  0.36 0.17* 0.37  CREATININE 1.23  --  1.38*  --   --    < > = values in this interval not displayed.   Estimated Creatinine Clearance: 95.5 mL/min (A) (by C-G formula based on SCr of 1.28 mg/dL (H)).  Assessment: 59 YOM on warfarin PTA for Afib and mechanical AVR (INR goal 2.5- 3.5). Vegetation seen on TTE this admission. Warfarin is held and bridged with IV heparin with impending need for procedures. He is s/p cath 4/26,  teeth extraction 4/27 and T10 lesion biopsy 4/28. Heparin restarted 4/29. Noted s/p biopsy on 4/29 and CABG and redo AVR on 5/3 -heparin level at goal  -Hg= 8.8   Warfarin PTA dosing: 5mg  MWF, 7.5mg  all other days   Goal of Therapy:  Heparin level 0.3-0.7 units/mL (0.5 -0.7 if no bleeding) Monitor platelets by anticoagulation protocol: Yes   Plan:  Continue heparin at 2050 units/hr Heparin level daily wth CBC daily CABG + AVR planned 5/3  Hildred Laser, PharmD Clinical Pharmacist **Pharmacist phone directory can now be found on [email or web address] ([email or web address]).  Listed under [place].

## 2019-09-20 NOTE — Progress Notes (Signed)
Progress Note  Patient Name: [name] Date of Encounter: 09/20/2019  Primary Cardiologist: Kirk Ruths, MD   Subjective   No cardiovascular complaints. Still has some mild back discomfort, not really changed since the biopsy. Pathology report not yet back. His weight has slowly increased throughout this hospitalization, now roughly 8-10 pounds higher than on admission, likely due to administration of IV fluids. PR interval on today's ECG is even longer 270 ms. Creatinine better at 1.28. Hemoglobin 8.8.  Inpatient Medications    Scheduled Meds: . aspirin EC  81 mg Oral Daily  . atorvastatin  40 mg Oral Q breakfast  . colchicine  0.6 mg Oral BID  . feeding supplement  1 Container Oral TID BM  . feeding supplement (PRO-STAT SUGAR FREE 64)  30 mL Oral BID  . gabapentin  300 mg Oral Q breakfast  . insulin aspart  0-15 Units Subcutaneous TID WC  . insulin aspart  0-5 Units Subcutaneous QHS  . insulin aspart  6 Units Subcutaneous TID WC  . insulin detemir  6 Units Subcutaneous BID  . levothyroxine  50 mcg Oral Q0600  . mupirocin ointment  1 application Nasal BID  . polyethylene glycol  17 g Oral BID  . sodium chloride flush  3 mL Intravenous Once  . sodium chloride flush  3 mL Intravenous Q12H   Continuous Infusions: . sodium chloride    . cefTRIAXone (ROCEPHIN)  IV 2 g (09/20/19 1103)  . gentamicin 220 mg (09/20/19 0600)  . heparin 2,050 Units/hr (09/20/19 1332)  . lactated ringers     PRN Meds: sodium chloride, acetaminophen **OR** acetaminophen, HYDROcodone-acetaminophen, nitroGLYCERIN, ondansetron **OR** ondansetron (ZOFRAN) IV, sodium chloride flush   Vital Signs    Vitals:   09/19/19 0840 09/19/19 1245 09/19/19 1947 09/20/19 0403  BP:  137/90 137/86 (!) 141/90  Pulse:  85 85 80  Resp:  _0 Temp:  98 F (36.7 C) 98.6 F (37 C) 98.5 F (36.9 C)  TempSrc:  Oral Oral Oral  SpO2: 100% 97% 99% 98%  Weight:    132.5 kg  Height:         Intake/Output Summary (Last 24 hours) at 09/20/2019 1405 Last data filed at 09/20/2019 0939 Gross per 24 hour  Intake 1194.05 ml  Output 1325 ml  Net -130.95 ml   Last 3 Weights 09/20/2019 09/19/2019 09/18/2019  Weight (lbs) 292 lb 1.8 oz 290 lb 6.4 oz 289 lb 12.8 oz  Weight (kg) 132.5 kg 131.725 kg 131.452 kg      Telemetry    Normal sinus rhythm- Personally Reviewed  ECG     Sinus rhythm with progressively longer PR interval, now 270 ms- Personally Reviewed  Physical Exam  Obese, looks very comfortable. Removed dressing from his back, one Steri-Strip is in place and the site looks very healthy. GEN: No acute distress.   Neck: No JVD Cardiac: RRR, crisp prosthetic valve sounds, 2/6 systolic ejection murmur in the aortic focus, no diastolic murmurs, rubs, or gallops.  Respiratory: Clear to auscultation bilaterally. GI: Soft, nontender, non-distended  MS: No edema; No deformity. Neuro:  Nonfocal  Psych: Normal affect   Labs    High Sensitivity Troponin:   Recent Labs  Lab 09/08/19 0810 09/08/19 1100  TROPONINIHS 34* 28*      Chemistry Recent Labs  Lab 09/15/19 0530 09/16/19 0754 09/17/19 0400 09/17/19 0400 09/18/19 0642 09/19/19 1653 09/20/19 0351  NA 137   < > 138   < > 137  138 139  K 3.8   < > 4.0   < > 3.8 3.5 3.6  CL 106   < > 108   < > 106 107 111  CO2 23   < > 20*   < > _0 GLUCOSE 142*   < > 143*   < > 143* 191* 139*  BUN 13   < > 10   < > _1 CREATININE 1.21   < > 1.23   < > 1.38* 1.38* 1.28*  CALCIUM 8.4*   < > 8.4*   < > 8.7* 8.4* 8.2*  PROT 6.6  --  7.5  --  7.7  --   --   ALBUMIN 2.4*  --  2.6*  --  2.7*  --   --   AST 21  --  23  --  22  --   --   ALT 13  --  12  --  11  --   --   ALKPHOS 67  --  67  --  68  --   --   BILITOT 1.1  --  1.0  --  0.7  --   --   GFRNONAA >60   < > >60   < > 58* 58* >60  GFRAA >60   < > >60   < > >60 >60 >60  ANIONGAP 8   < > 10   < > _2 < > = values in this interval not displayed.      Hematology Recent Labs  Lab 09/18/19 0642 09/19/19 0320 09/20/19 0351  WBC 9.7 7.1 7.7  RBC 3.24* 3.08* 2.98*  HGB 9.4* 8.7* 8.8*  HCT 27.1* 25.6* 24.8*  MCV 83.6 83.1 83.2  MCH 29.0 28.2 29.5  MCHC 34.7 34.0 35.5  RDW 14.7 14.9 15.0  PLT 246 195 195    BNPNo results for input(s): BNP, PROBNP in the last 168 hours.   DDimer No results for input(s): DDIMER in the last 168 hours.   Radiology    IR Fluoro Guide Ndl Plmt / BX  Result Date: 09/19/2019 INDICATION: 54 year-old-male with a T10 lytic lesion involving the T10 vertebral body extending to the right pedicle. EXAM: Fluoroscopy guided fine needle aspiration and core bone biopsy of T10 vertebral body. MEDICATIONS: None. ANESTHESIA/SEDATION: Moderate (conscious) sedation was employed during this procedure. A total of Versed 3 mg and Fentanyl 150 mcg was administered intravenously. Moderate Sedation Time: 54 minutes. The patient's level of consciousness and vital signs were monitored continuously by radiology nursing throughout the procedure under my direct supervision. FLUOROSCOPY TIME:  Fluoroscopy Time: 8 minutes 48 seconds (1554 mGy). COMPLICATIONS: None immediate. PROCEDURE: Informed written consent was obtained from the patient after a thorough discussion of the procedural risks, benefits and alternatives. All questions were addressed. Maximal Sterile Barrier Technique was utilized including caps, mask, sterile gowns, sterile gloves, sterile drape, hand hygiene and skin antiseptic. A timeout was performed prior to the initiation of the procedure. The patient was placed in prone position on the angiography table. The thoracic spine region was prepped and draped in a sterile fashion. Under fluoroscopy, the T10 vertebral body was delineated and the skin area was marked. The skin was infiltrated with a 1% Lidocaine approximately 3 cm lateral to the spinous process projection on the right. Using a 22-gauge spinal needle, the soft issue  and the peripedicular space and periosteum were infiltrated with Bupivacaine 0.5%. A skin  incision was made at the access site. Subsequently, an 11-gauge Kyphon trocar was inserted under fluoroscopic guidance until contact with the pedicle was obtained. The trocar was inserted under light hammer tapping into the pedicle until the posterior boundaries of the vertebral body was reached. The diamond mandrill was removed and 3 final needle aspirations and one core biopsy wereobtained. The access sites were cleaned and covered with a sterile bandage. IMPRESSION: Fluoroscopy-guided T10 vertebral body a fine-needle aspiration core bone biopsy . Bone samples obtained were sent for pathology analysis. Electronically Signed   By: Pedro Earls M.D.   On: 09/19/2019 16:27    Cardiac Studies   CATH 09/16/2019  Ost RCA to Prox RCA lesion is 100% stenosed.  SVG to RVA with Origin lesion is 80% stenosed in stent.  Hemodynamic findings consistent with mild pulmonary hypertension.  1. Single vessel occlusive CAD with CTO of the ostium of the RCA 2. SVG to RCA with 80% in stent stenosis at the ostium. The RCA is a large vessel. 3. Mildly elevated LV filling pressures 4. Mild pulmonary HTN 5. Normal cardiac output.   Plan: Redo AVR and CABG to RCA.  TEE 09/11/2019  1. Left ventricular ejection fraction, by estimation, is 55 to 60%. The  left ventricle has normal function. The left ventricle has no regional  wall motion abnormalities. There is mild concentric left ventricular  hypertrophy.  2. Right ventricular systolic function is normal. The right ventricular  size is mildly enlarged. There is mildly elevated pulmonary artery  systolic pressure. The estimated right ventricular systolic pressure is  63.1 mmHg.  3. It appears that the left atrial appendage has been surgically resected  or oversown. Left atrial size was severely dilated. No left atrial/left  atrial appendage  thrombus was detected. The LAA emptying velocity was 35  cm/s.  4. Right atrial size was severely dilated.  5. The mitral valve is normal in structure. Mild mitral valve  regurgitation.  6. There is a large, moderately mobile vegetation attached to the septal  leaflet of the tricuspid valve, measuring 27 mm in length and 11 mm in  width. It appears heterogeneous and friable.. Tricuspid valve  regurgitation is moderate.  7. There is a large vegetation attached to the medial aspect of posterior  disc or the posteromedial mechanical valve annulus. It is highly mobile  and prolapses readily across the valve orifice. It measures 15 mm in  length and 6 mm in width. Although  there is no evidence of a periannular abscess cavity, the periannular  tissue echodensity is heterogeneous, raining concern for  inflammation/early abscess formation. The proximity of the aortic and the  tricuspid vegetation to the same area of the  interventricular septum also raises concern for intramyocardial abscess or  fistula formation. The aortic valve has been repaired/replaced. Aortic  valve regurgitation is trivial. Mild aortic valve stenosis. There is a  unknown [employer] tilting disk valve  present in the aortic position. Procedure Date: 2000. Aortic valve mean  gradient measures 20.0 mmHg.  8. There is mild (Grade II) atheroma plaque involving the transverse  aorta.   Patient Profile     54 y.o. 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 Additional issues: lytic lesion in his thoracic spine (T10), s/p biopsy on 04/28.Numerous dental problems, likely source of endocarditis, s/p dental extractions 04/27.Gluteal  area furuncle, spontaneously drained 4/23, currently packed. Steadily lengthening  PR interval on ECG raises concern for periannular abscess formation. Assessment & Plan    1. Endocarditis: redo aortic valve replacement, probably with cadaveric homograft,scheduled forMay 03. On antibiotics IV. Afebrile, Follow-up blood cultures from 04 20/2021 are negative. WBC has normalized. ESR 124 (09/10/2019), ESR still 122(09/16/2019). Warfarin has been discontinued and he is on IV heparin, INR1.7today.  PR interval has lengthened further to 270 ms. Watch closely for any signs of second-degree AV block. Increased risk for requiring permanent pacemaker postop. 2. CAD: He will have redo bypass to the RCA, no other stenotic vessels 3. PAFib:without recent recurrence, on IV heparin.  4. T10 lytic lesion:biopsy  09/18/2019, hopefully path report at some point today 5. DM: Increased risk of infection/healing problems. Until recently glycemic control was excellent with hemoglobin A1c in the 6-7% range, on current admission up to 8.2%, possibly due to active infection. 6. AKI: Baseline creatinine 1.1, as high as 1.5 early on during this admission. Now trending up over last 48 h. Watch very closely since he had contrast for cath on 09/16/19 and he is receiving aminoglycosides.  Creatinine trending back down today.      For questions or updates, please contact [place] Please consult [email or web address] for contact info under        Signed, Sanda Klein, MD  09/20/2019, 2:05 PM

## 2019-09-20 NOTE — Progress Notes (Signed)
PROGRESS NOTE    [name]  [id number] DOB: 06-09-1965 DOA: 09/08/2019 PCP: Biagio Borg, MD   Brief Narrative:  54 y.o.malewith history of CAD s/p CABG in 2000, SVG-RCA PCI with BMS in Feb 2015, he had re-do PCI in June 2019 DES to SVG graft, ascending aortic dissection in 2000 s/p repair with AVR on warfarin. Paroxysmal A. fib. NIDDM-2, HTN and HLD presenting with 5 days of chills, nausea, vomiting, diarrhea and dyspnea on exertion and orthopnea mainly with lying on the left side. He's been found to have infective endocarditis with strep infantarius bacteremia.  He had a TEE showing large vegetations on the septal leaflet of the tricuspid valve and on the posterior disc of the aortic valve.  He's on ceftriaxone/gentamicin for his endocarditis at this point.  ID and cardiology are following.  He was seen by dental medicine 4/27 for multiple tooth extraction, alveoloplasty, debridement.  He's also been seen by surgery for a left buttocks abscess which has spontaneously drained and by orthopedics for L MTP pain and swelling which is thought to be gout.  He had an incidental aggressive appearing lesion in T10 which will be biopsied on 4/28 by IR, neurosurgery is also following for this lesion.  Dr. Cyndia Bent from [facility] planning for surgery on Monday 5/3.   Assessment & Plan:   Principal Problem:   Suspected endocarditis mechanical aortic valve Active Problems:   Diabetes mellitus type II, non insulin dependent (HCC)   Hyperlipidemia   Essential hypertension   PAF (paroxysmal atrial fibrillation) (HCC)   Hypothyroidism   Normocytic anemia   Shortness of breath   Obesity (BMI 30-39.9)   CAD (coronary artery disease)   Nausea vomiting and diarrhea   Gram-positive cocci bacteremia   Endocarditis of tricuspid valve   Infective endocarditis -tricuspid vegetation with regurgitation strep Infantarius Bacteremia:  - blood cx 4/19 strep infantarius intermediate to penicillin -  blood cx 4/20 cx NGTD - ID consulted, appreciate recommendations - continue ceftriaxone/gentamicin -Eventually will need outpatient colonoscopy - TEE with large vegetations on septal leaflet of the tricuspid valve and on the posterior disc of the mechanical aortic valve (see report) -Repeat cultures negative, PICC in place - orthopantogram with dental caries and periodontal disease 4/21 -CT surgery tentatively planned 5/3 - 4/27 s/p multiple tooth extraction, alveoloplasty, debridement by dentistry - cardiac catheterization 4/26 -> with single vessel occlusive CAD with CTO of the ostium of the RCA, SVG to RCA with 80% in stent stenosis at the ostium, mildly elevated LV filling pressures, mild pulm HTN -> plan for redo AVR and CABG to RCA -IR-T10 spinal lesion biopsy performed 4/28-results pending  Left Buttocks Abscess: Spontaneous drainage.  Seen by general surgery.  Flagyl discontinued 4/29.  Routine dressing per surgery and wound care  Left MTP Pain, concerning for gout continue colchicine.  Seen by orthopedic no further recommendations.  Aggressive appearing lesion in T10 vertebral body: Status post biopsy 4/28-results pending Seen by neurosurgery  Nausea/vomiting/diarrhea: GI panel is negative.  Supportive management.  Iron Def Anemia:  Iron supplements with bowel regimen B12, folate-normal  CAD s/p CABG in 2000, SVG-RCA PCI with BMS in Feb 2015, he had re-do PCI in June 2019 DES to SVG graft: Echocardiogram as above. -Continue homeASA andstatin.  -Plavix on hold, last dose 4/19 -Metoprolol, losartan and Lasix on hold due to AKI and soft blood pressures  Ascending aortic dissection in 2000 s/p repair with Mechanical AVR on warfarin.  -Heparin drip resumed -Monitor fluid status  and renal function.  Paroxysmal A. fib.  -Continue heparin drip.  Uncontrolled NIDDM-2 with hyperglycemia: A1c 8.2%. -ContinueSSI-moderate -Levemir 6 units twice daily, NovoLog 6  units 3 times daily -Continue statin. Check lipid panel  CKD stage IIIa (vancomycin, Lasix and losartan) -Baseline creatinine 1.2-1.3. -Holding metoprolol, Lasix and losartan -Continue monitoring  Essential HTN: -metoprolol, amlodipine and losartanon hold.  BP's reasonable  HLD  -Continue atorvastatin.  Hypothyroidism: -Continue home Synthroid.   Morbid obesity:Body mass index is 36.03 kg/m.With comorbidities as above. -Encourage lifestyle change to lose weight.  Would benefit from outpatient sleep study   DVT prophylaxis: Heparin drip Code Status: Full code Family Communication: None at bedside Disposition Plan:   Patient From= home  Patient Anticipated D/C place= to be determined  Barriers= plan CT surgery early next week, maintain hospital stay  Subjective: No acute events overnight.  No complaints.  Review of Systems Otherwise negative except as per HPI, including: General: Denies fever, chills, night sweats or unintended weight loss. Resp: Denies cough, wheezing, shortness of breath. Cardiac: Denies chest pain, palpitations, orthopnea, paroxysmal nocturnal dyspnea. GI: Denies abdominal pain, nausea, vomiting, diarrhea or constipation GU: Denies dysuria, frequency, hesitancy or incontinence MS: Denies muscle aches, joint pain or swelling Neuro: Denies headache, neurologic deficits (focal weakness, numbness, tingling), abnormal gait Psych: Denies anxiety, depression, SI/HI/AVH Skin: Denies new rashes or lesions ID: Denies sick contacts, exotic exposures, travel  Examination:  Constitutional: Not in acute distress Respiratory: Clear to auscultation bilaterally Cardiovascular: Normal sinus rhythm, no rubs Abdomen: Nontender nondistended good bowel sounds Musculoskeletal: No edema noted Skin: No rashes seen Neurologic: CN 2-12 grossly intact.  And nonfocal Psychiatric: Normal judgment and insight. Alert and oriented x 3. Normal  mood.  Objective: Vitals:   09/19/19 0840 09/19/19 1245 09/19/19 1947 09/20/19 0403  BP:  137/90 137/86 (!) 141/90  Pulse:  85 85 80  Resp:  19 17 17   Temp:  98 F (36.7 C) 98.6 F (37 C) 98.5 F (36.9 C)  TempSrc:  Oral Oral Oral  SpO2: 100% 97% 99% 98%  Weight:    132.5 kg  Height:        Intake/Output Summary (Last 24 hours) at 09/20/2019 1150 Last data filed at 09/20/2019 0939 Gross per 24 hour  Intake 1194.05 ml  Output 1325 ml  Net -130.95 ml   Filed Weights   09/18/19 0453 09/19/19 0614 09/20/19 0403  Weight: 131.5 kg 131.7 kg 132.5 kg     Data Reviewed:   CBC: Recent Labs  Lab 09/14/19 0909 09/14/19 0909 09/15/19 0530 09/15/19 0530 09/16/19 0754 09/16/19 0754 09/16/19 0936 09/17/19 0400 09/18/19 0642 09/19/19 0320 09/20/19 0351  WBC 9.2   < > 8.6   < > 9.9  --   --  10.2 9.7 7.1 7.7  NEUTROABS 7.0  --  6.2  --   --   --   --  7.8*  --   --   --   HGB 9.8*   < > 8.9*   < > 9.3*   < > 8.2*  8.5* 9.2* 9.4* 8.7* 8.8*  HCT 27.6*   < > 25.0*   < > 26.0*   < > 24.0*  25.0* 26.5* 27.1* 25.6* 24.8*  MCV 81.2   < > 82.0   < > 81.8  --   --  81.5 83.6 83.1 83.2  PLT 230   < > 212   < > 219  --   --  226 246 195 195   < > =  values in this interval not displayed.   Basic Metabolic Panel: Recent Labs  Lab 09/14/19 0339 09/14/19 0909 09/15/19 0530 09/15/19 0530 09/16/19 0754 09/16/19 0754 09/16/19 0936 09/17/19 0400 09/18/19 0642 09/19/19 1653 09/20/19 0351  NA  --    < > 137   < > 136   < > 139  140 138 137 138 139  K  --    < > 3.8   < > 3.8   < > 3.7  3.7 4.0 3.8 3.5 3.6  CL  --    < > 106   < > 106  --   --  108 106 107 111  CO2  --    < > 23   < > 23  --   --  20* 24 23 22   GLUCOSE  --    < > 142*   < > 164*  --   --  143* 143* 191* 139*  BUN  --    < > 13   < > 12  --   --  10 13 12 11   CREATININE  --    < > 1.21   < > 1.17  --   --  1.23 1.38* 1.38* 1.28*  CALCIUM  --    < > 8.4*   < > 8.4*  --   --  8.4* 8.7* 8.4* 8.2*  MG 1.7  --  1.7   --   --   --   --  1.5* 2.0  --   --   PHOS 3.7  --  3.7  --   --   --   --  3.2 3.5  --   --    < > = values in this interval not displayed.   GFR: Estimated Creatinine Clearance: 95.5 mL/min (A) (by C-G formula based on SCr of 1.28 mg/dL (H)). Liver Function Tests: Recent Labs  Lab 09/14/19 0339 09/15/19 0530 09/17/19 0400 09/18/19 0642  AST 21 21 23 22   ALT 12 13 12 11   ALKPHOS 70 67 67 68  BILITOT 0.8 1.1 1.0 0.7  PROT 6.8 6.6 7.5 7.7  ALBUMIN 2.3* 2.4* 2.6* 2.7*   No results for input(s): LIPASE, AMYLASE in the last 168 hours. No results for input(s): AMMONIA in the last 168 hours. Coagulation Profile: Recent Labs  Lab 09/16/19 0302 09/17/19 0400 09/18/19 0642 09/19/19 0320 09/20/19 0351  INR 1.7* 1.6* 1.8* 1.7* 1.7*   Cardiac Enzymes: No results for input(s): CKTOTAL, CKMB, CKMBINDEX, TROPONINI in the last 168 hours. BNP (last 3 results) No results for input(s): PROBNP in the last 8760 hours. HbA1C: No results for input(s): HGBA1C in the last 72 hours. CBG: Recent Labs  Lab 09/19/19 1134 09/19/19 1540 09/19/19 2144 09/20/19 0735 09/20/19 1121  GLUCAP 103* 171* 160* 145* 232*   Lipid Profile: No results for input(s): CHOL, HDL, LDLCALC, TRIG, CHOLHDL, LDLDIRECT in the last 72 hours. Thyroid Function Tests: No results for input(s): TSH, T4TOTAL, FREET4, T3FREE, THYROIDAB in the last 72 hours. Anemia Panel: No results for input(s): VITAMINB12, FOLATE, FERRITIN, TIBC, IRON, RETICCTPCT in the last 72 hours. Sepsis Labs: No results for input(s): PROCALCITON, LATICACIDVEN in the last 168 hours.  Recent Results (from the past 240 hour(s))  Culture, blood (routine x 2)     Status: None   Collection Time: 09/10/19  2:46 PM   Specimen: BLOOD RIGHT FOREARM  Result Value Ref Range Status   Specimen Description BLOOD RIGHT FOREARM  Final  Special Requests   Final    BOTTLES DRAWN AEROBIC AND ANAEROBIC Blood Culture adequate volume   Culture   Final    NO  GROWTH 5 DAYS Performed at [facility], [place] [address] [place], [place] [address]    Report Status 09/15/2019 FINAL  Final  Culture, blood (routine x 2)     Status: None   Collection Time: 09/10/19  2:51 PM   Specimen: BLOOD RIGHT WRIST  Result Value Ref Range Status   Specimen Description BLOOD RIGHT WRIST  Final   Special Requests   Final    BOTTLES DRAWN AEROBIC ONLY Blood Culture adequate volume   Culture   Final    NO GROWTH 5 DAYS Performed at [facility], [place] [address], [place], [place] [address]    Report Status 09/15/2019 FINAL  Final  Surgical PCR screen     Status: None   Collection Time: 09/16/19  3:34 PM   Specimen: Nasal Mucosa; Nasal Swab  Result Value Ref Range Status   MRSA, PCR NEGATIVE NEGATIVE Final   Staphylococcus aureus NEGATIVE NEGATIVE Final    Comment: (NOTE) The Xpert SA Assay (FDA approved for NASAL specimens in patients 38 years of age and older), is one component of a comprehensive surveillance program. It is not intended to diagnose infection nor to guide or monitor treatment. Performed at [facility], [place] [address] [place], [place] [address]          Radiology Studies: IR Fluoro Guide Ndl Plmt / BX  Result Date: 09/19/2019 INDICATION: 54 year-old-male with a T10 lytic lesion involving the T10 vertebral body extending to the right pedicle. EXAM: Fluoroscopy guided fine needle aspiration and core bone biopsy of T10 vertebral body. MEDICATIONS: None. ANESTHESIA/SEDATION: Moderate (conscious) sedation was employed during this procedure. A total of Versed 3 mg and Fentanyl 150 mcg was administered intravenously. Moderate Sedation Time: 54 minutes. The patient's level of consciousness and vital signs were monitored continuously by radiology nursing throughout the procedure under my direct supervision. FLUOROSCOPY TIME:  Fluoroscopy Time: 8 minutes 48 seconds (1554 mGy). COMPLICATIONS: None immediate. PROCEDURE: Informed written  consent was obtained from the patient after a thorough discussion of the procedural risks, benefits and alternatives. All questions were addressed. Maximal Sterile Barrier Technique was utilized including caps, mask, sterile gowns, sterile gloves, sterile drape, hand hygiene and skin antiseptic. A timeout was performed prior to the initiation of the procedure. The patient was placed in prone position on the angiography table. The thoracic spine region was prepped and draped in a sterile fashion. Under fluoroscopy, the T10 vertebral body was delineated and the skin area was marked. The skin was infiltrated with a 1% Lidocaine approximately 3 cm lateral to the spinous process projection on the right. Using a 22-gauge spinal needle, the soft issue and the peripedicular space and periosteum were infiltrated with Bupivacaine 0.5%. A skin incision was made at the access site. Subsequently, an 11-gauge Kyphon trocar was inserted under fluoroscopic guidance until contact with the pedicle was obtained. The trocar was inserted under light hammer tapping into the pedicle until the posterior boundaries of the vertebral body was reached. The diamond mandrill was removed and 3 final needle aspirations and one core biopsy wereobtained. The access sites were cleaned and covered with a sterile bandage. IMPRESSION: Fluoroscopy-guided T10 vertebral body a fine-needle aspiration core bone biopsy . Bone samples obtained were sent for pathology analysis. Electronically Signed   By: Jeanella Craze  Debbrah Alar M.D.   On: 09/19/2019 16:27        Scheduled Meds: . aspirin EC  81 mg Oral Daily  . atorvastatin  40 mg Oral Q breakfast  . colchicine  0.6 mg Oral BID  . feeding supplement  1 Container Oral TID BM  . feeding supplement (PRO-STAT SUGAR FREE 64)  30 mL Oral BID  . gabapentin  300 mg Oral Q breakfast  . insulin aspart  0-15 Units Subcutaneous TID WC  . insulin aspart  0-5 Units Subcutaneous QHS  . insulin aspart  6  Units Subcutaneous TID WC  . insulin detemir  6 Units Subcutaneous BID  . levothyroxine  50 mcg Oral Q0600  . mupirocin ointment  1 application Nasal BID  . polyethylene glycol  17 g Oral BID  . sodium chloride flush  3 mL Intravenous Once  . sodium chloride flush  3 mL Intravenous Q12H   Continuous Infusions: . sodium chloride    . cefTRIAXone (ROCEPHIN)  IV 2 g (09/20/19 1103)  . gentamicin 220 mg (09/20/19 0600)  . heparin 2,050 Units/hr (09/20/19 0550)  . lactated ringers       LOS: 12 days   Time spent=35 mins    Makaveli Hoard Arsenio Loader, MD [facility]  If 7PM-7AM, please contact night-coverage  09/20/2019, 11:50 AM

## 2019-09-21 ENCOUNTER — Inpatient Hospital Stay (HOSPITAL_COMMUNITY): Payer: Medicare PPO

## 2019-09-21 DIAGNOSIS — I079 Rheumatic tricuspid valve disease, unspecified: Secondary | ICD-10-CM | POA: Diagnosis not present

## 2019-09-21 LAB — CBC
HCT: 24.9 % — ABNORMAL LOW (ref 39.0–52.0)
Hemoglobin: 8.6 g/dL — ABNORMAL LOW (ref 13.0–17.0)
MCH: 28.4 pg (ref 26.0–34.0)
MCHC: 34.5 g/dL (ref 30.0–36.0)
MCV: 82.2 fL (ref 80.0–100.0)
Platelets: 171 10*3/uL (ref 150–400)
RBC: 3.03 MIL/uL — ABNORMAL LOW (ref 4.22–5.81)
RDW: 15 % (ref 11.5–15.5)
WBC: 7.5 10*3/uL (ref 4.0–10.5)
nRBC: 0 % (ref 0.0–0.2)

## 2019-09-21 LAB — GLUCOSE, CAPILLARY
Glucose-Capillary: 117 mg/dL — ABNORMAL HIGH (ref 70–99)
Glucose-Capillary: 117 mg/dL — ABNORMAL HIGH (ref 70–99)
Glucose-Capillary: 116 mg/dL — ABNORMAL HIGH (ref 70–99)
Glucose-Capillary: 174 mg/dL — ABNORMAL HIGH (ref 70–99)

## 2019-09-21 LAB — BASIC METABOLIC PANEL
Anion gap: 7 (ref 5–15)
BUN: 11 mg/dL (ref 6–20)
CO2: 22 mmol/L (ref 22–32)
Calcium: 8.4 mg/dL — ABNORMAL LOW (ref 8.9–10.3)
Chloride: 110 mmol/L (ref 98–111)
Creatinine, Ser: 1.34 mg/dL — ABNORMAL HIGH (ref 0.61–1.24)
GFR calc Af Amer: >60 mL/min (ref >60)
GFR calc non Af Amer: 60 mL/min — ABNORMAL LOW (ref >60)
Glucose, Bld: 147 mg/dL — ABNORMAL HIGH (ref 70–99)
Potassium: 3.8 mmol/L (ref 3.5–5.1)
Sodium: 139 mmol/L (ref 135–145)

## 2019-09-21 LAB — HEPARIN LEVEL (UNFRACTIONATED): Heparin Unfractionated: 0.55 IU/mL (ref 0.30–0.70)

## 2019-09-21 LAB — PROTIME-INR
INR: 1.6 — ABNORMAL HIGH (ref 0.8–1.2)
Prothrombin Time: 18.2 seconds — ABNORMAL HIGH (ref 11.4–15.2)

## 2019-09-21 IMAGING — CT CT CHEST W/ CM
2 of 4 series · 13 of 36 positions shown, 16 images · IV contrast (APPLIED)
Comparison: 09/10/2019
COMPARISON: 09/10/2019

Addendum:
CLINICAL DATA: Chest pain. Shortness of breath. Concern for pleural
effusion.

EXAM:
CT CHEST WITH CONTRAST
TECHNIQUE: Multidetector CT imaging of the chest was performed during
intravenous contrast administration.
CONTRAST:  75mL OMNIPAQUE IOHEXOL 300 MG/ML  SOLN

[Series 3: chest w · axial · 0.96mm/px · z∈[-325,-47]mm · 10 of 165 slices shown, 13 images]
[im 13/165  mediastinal]
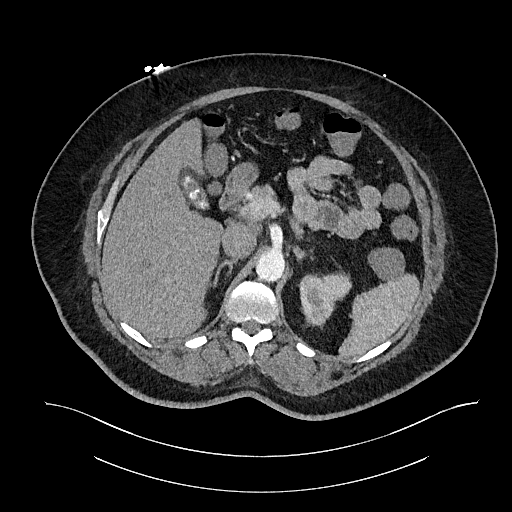
[im 13/165  lung]
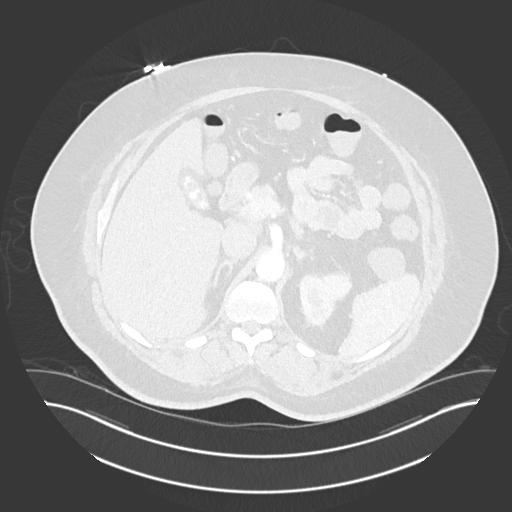
[im 26/165  lung]
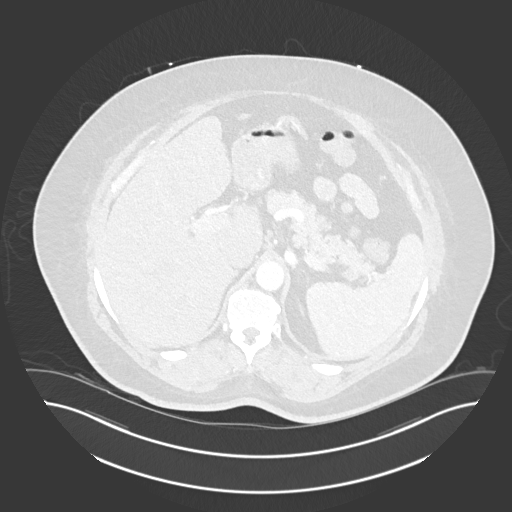
[im 51/165  lung]
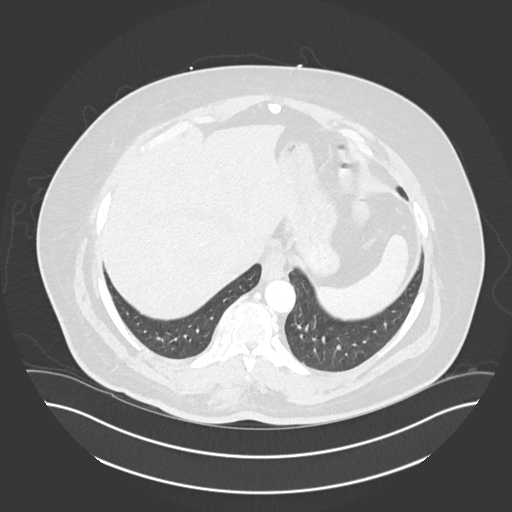
[im 64/165  lung]
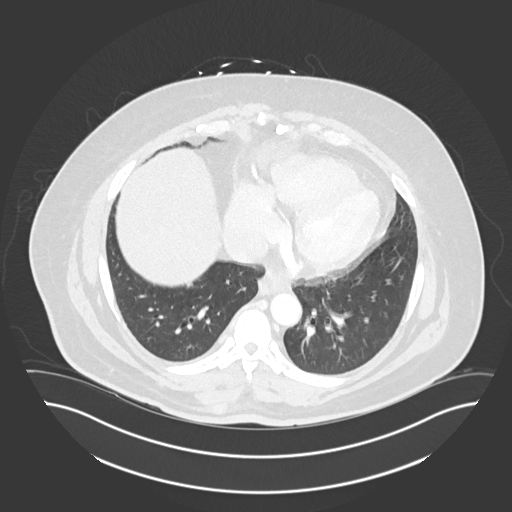
[im 76/165  mediastinal]
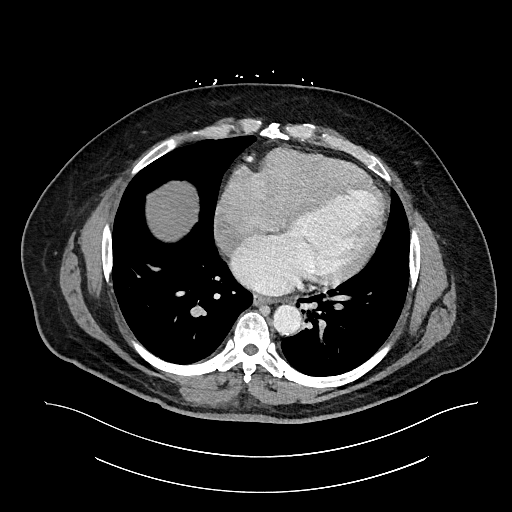
[im 76/165  lung]
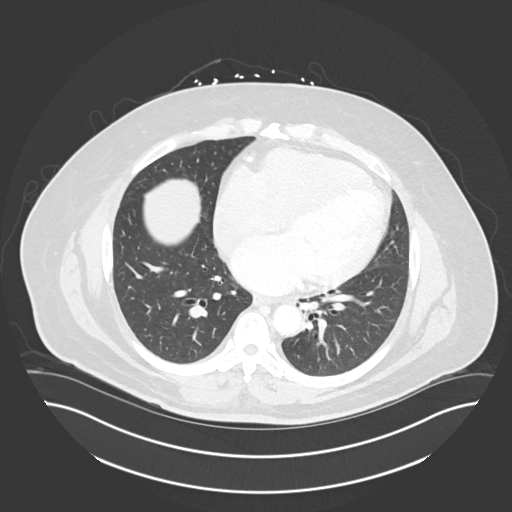
[im 89/165  lung]
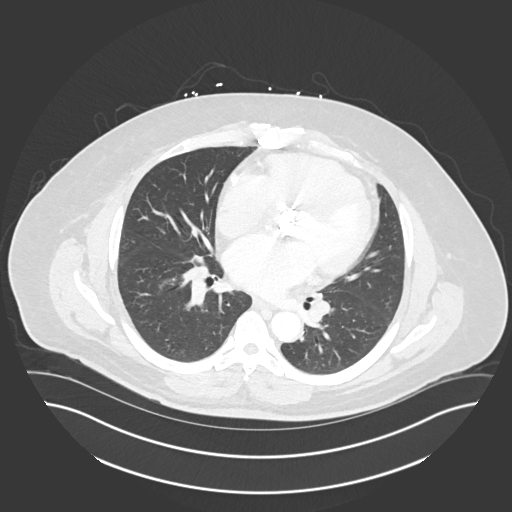
[im 101/165  lung]
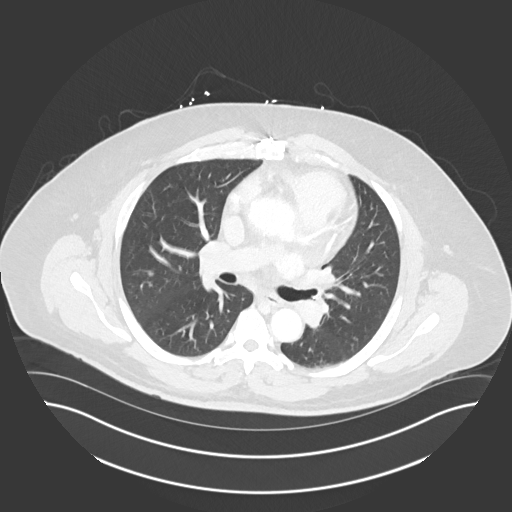
[im 127/165  lung]
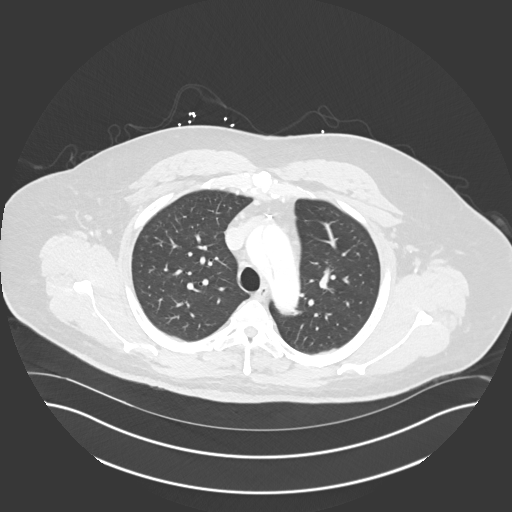
[im 139/165  mediastinal]
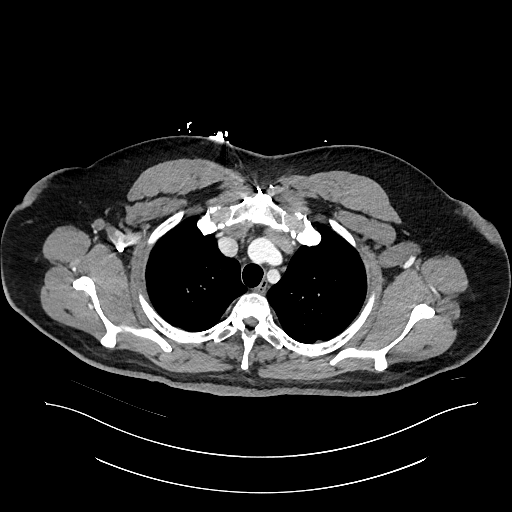
[im 139/165  lung]
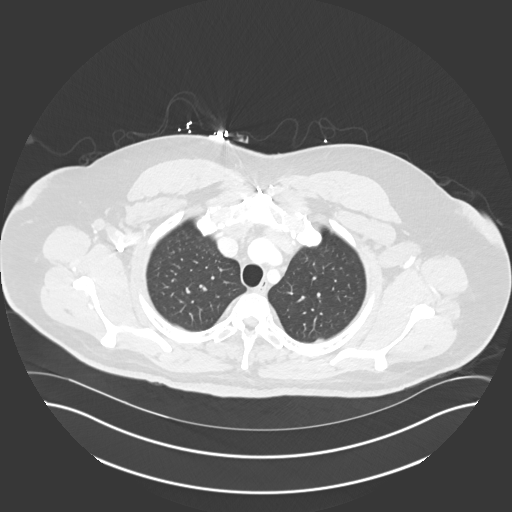
[im 152/165  lung]
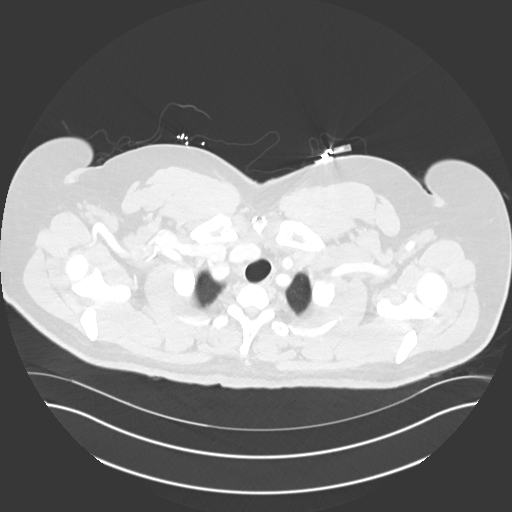

[Series 6: cor · coronal · 0.64mm/px · 3 of 179 slices shown]
[im 36/179  lung]
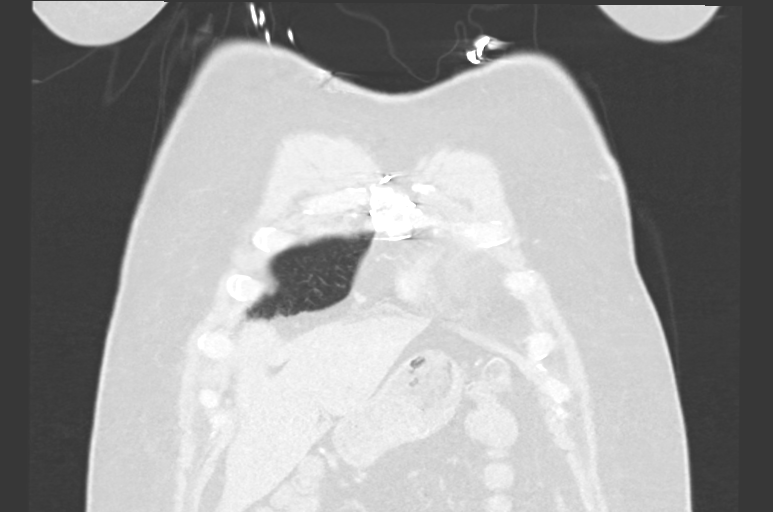
[im 72/179  lung]
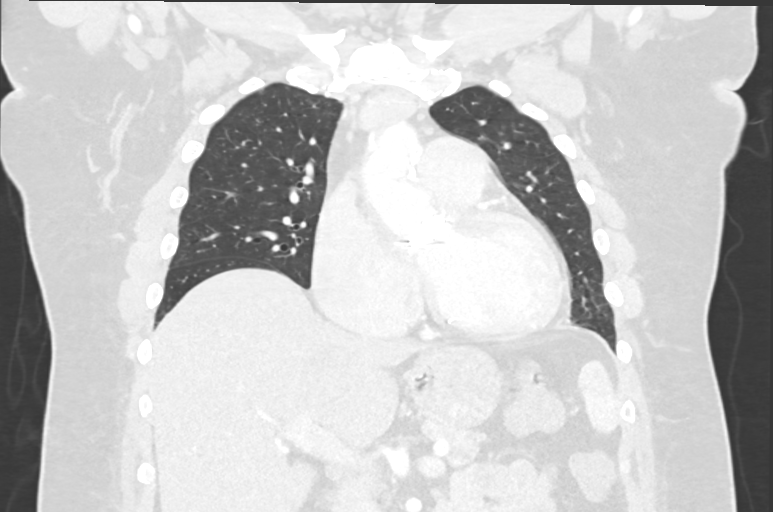
[im 107/179  lung]
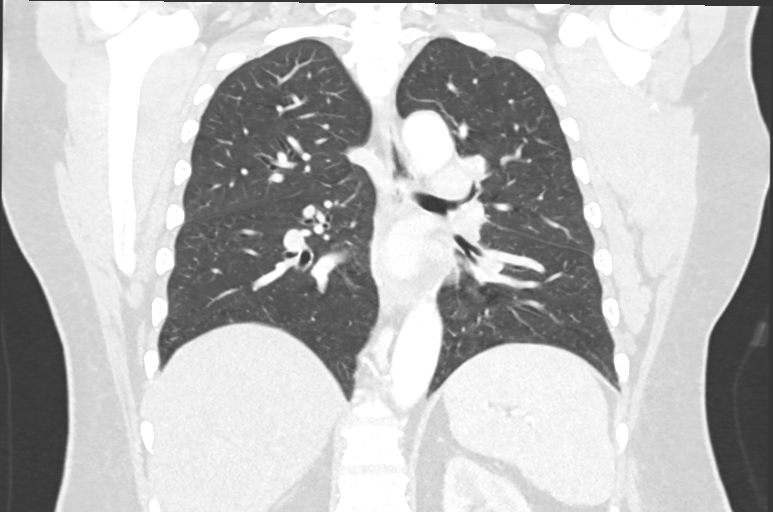

[13 of 36 positions shown; findings below may reference images not displayed]

FINDINGS: Cardiovascular: The patient is again status Bentall procedure. A
chronic dissection is again noted involving the aortic arch. The
heart size is stable but enlarged. The main pulmonary artery is
dilated but stable from prior study. Aortic calcifications are
noted. Atherosclerotic changes are noted of the thoracic aorta.
There is no pericardial effusion.

Mediastinum/Nodes:

--No mediastinal or hilar lymphadenopathy.

--No axillary lymphadenopathy.

--No supraclavicular lymphadenopathy.

--Normal thyroid gland.

--The esophagus is unremarkable

Lungs/Pleura: No pulmonary nodules or masses. No pleural effusion or
pneumothorax. No focal airspace consolidation. No focal pleural
abnormality.

Upper Abdomen: There is cholelithiasis.

Musculoskeletal: Again noted is a lytic lesion involving the T10
vertebral body. This lesion extends into the posterior tenth rib on
the right. Subtle additional lytic lesions are noted in various
osseous structures including the anterior fifth rib on the left.

Review of the MIP images confirms the above findings.
IMPRESSION: 1. No acute abnormality.
2. Again noted is an aggressive appearing lytic lesion in the T10
vertebral body. Additional smaller subtle lytic lesions are noted
involving the ribs, especially the anterior left fifth rib.
3. Cholelithiasis.

Aortic Atherosclerosis (8ENHI-IDE.E).

ADDENDUM:
In the soft tissues posterior to the T10 vertebral body there is a
small 3.4 x 1.5 cm collection in the subcutaneous fat. This is
consistent with a small post biopsy hematoma. There is no evidence
for active extravasation.

*** End of Addendum ***
FINDINGS: Cardiovascular: The patient is again status Bentall procedure. A
chronic dissection is again noted involving the aortic arch. The
heart size is stable but enlarged. The main pulmonary artery is
dilated but stable from prior study. Aortic calcifications are
noted. Atherosclerotic changes are noted of the thoracic aorta.
There is no pericardial effusion.

Mediastinum/Nodes:

--No mediastinal or hilar lymphadenopathy.

--No axillary lymphadenopathy.

--No supraclavicular lymphadenopathy.

--Normal thyroid gland.

--The esophagus is unremarkable

Lungs/Pleura: No pulmonary nodules or masses. No pleural effusion or
pneumothorax. No focal airspace consolidation. No focal pleural
abnormality.

Upper Abdomen: There is cholelithiasis.

Musculoskeletal: Again noted is a lytic lesion involving the T10
vertebral body. This lesion extends into the posterior tenth rib on
the right. Subtle additional lytic lesions are noted in various
osseous structures including the anterior fifth rib on the left.

Review of the MIP images confirms the above findings.
IMPRESSION: 1. No acute abnormality.
2. Again noted is an aggressive appearing lytic lesion in the T10
vertebral body. Additional smaller subtle lytic lesions are noted
involving the ribs, especially the anterior left fifth rib.
3. Cholelithiasis.

Aortic Atherosclerosis (8ENHI-IDE.E).

## 2019-09-21 MED ORDER — SODIUM CHLORIDE 0.9% FLUSH: 10.0000 mL | INTRAVENOUS | Status: DC | PRN

## 2019-09-21 MED ORDER — IOHEXOL 300 MG/ML  SOLN
75.0000 mL | Freq: Once | INTRAMUSCULAR | Status: AC | PRN
  Administered 2019-09-21: 75 mL via INTRAVENOUS

## 2019-09-21 MED ORDER — SODIUM CHLORIDE 0.9% FLUSH
10.0000 mL | Freq: Two times a day (BID) | INTRAVENOUS | Status: DC
  Administered 2019-09-21 – 2019-09-25 (×5): 10 mL

## 2019-09-21 NOTE — Progress Notes (Signed)
ANTICOAGULATION CONSULT NOTE   Pharmacy Consult for warfarin>>heparin Indication: atrial fibrillation + mechanical AVR  Patient Measurements: Height: 6\' 2"  (188 cm) Weight: 133.1 kg (293 lb 6.9 oz) IBW/kg (Calculated) : 82.2  Heparin weight: 131.5 kg  Vital Signs: Temp: 98.1 F (36.7 C) (05/01 0314) Temp Source: Oral (05/01 0314) BP: 144/96 (05/01 0314) Pulse Rate: 89 (05/01 0314)  Labs: Recent Labs    09/17/19 0400 09/17/19 0400 09/18/19 0642 09/19/19 0320 09/19/19 0745  HGB 9.2*   < > 9.4* 8.7*  --   HCT 26.5*  --  27.1* 25.6*  --   PLT 226  --  246 195  --   LABPROT 18.9*  --  20.2* 19.7*  --   INR 1.6*  --  1.8* 1.7*  --   HEPARINUNFRC  --   --  0.36 0.17* 0.37  CREATININE 1.23  --  1.38*  --   --    < > = values in this interval not displayed.   Estimated Creatinine Clearance: 91.5 mL/min (A) (by C-G formula based on SCr of 1.34 mg/dL (H)).  Assessment: 23 YOM on warfarin PTA for Afib and mechanical AVR (INR goal 2.5- 3.5). Vegetation seen on TTE this admission. Warfarin is held and bridged with IV heparin with impending need for procedures. He is s/p cath 4/26,  teeth extraction 4/27 and T10 lesion biopsy 4/28. Heparin restarted 4/29. Noted s/p biopsy on 4/29 and plan CABG and redo AVR on 5/3.  Heparin level came back therapeutic at 0.55, on 2050 units/hr. INR 1.6. Hgb 8.6, plt 171. No s/sx of bleeding or infusion issues.   Warfarin PTA dosing: 5mg  MWF, 7.5mg  all other days   Goal of Therapy:  Heparin level 0.3-0.7 units/mL (0.5 -0.7 if no bleeding) Monitor platelets by anticoagulation protocol: Yes   Plan:  Continue heparin at 2050 units/hr Heparin level daily wth CBC daily CABG + AVR planned 5/3  Antonietta Jewel, PharmD, [place] [facility]  Phone: [phone] 09/21/2019 8:06 AM  Please check AMION for all [place] phone numbers After 10:00 PM, call [place] [phone]

## 2019-09-21 NOTE — Progress Notes (Signed)
PROGRESS NOTE    [name]  [id number] DOB: 1965-10-14 DOA: 09/08/2019 PCP: Biagio Borg, MD   Brief Narrative:  54 y.o.malewith history of CAD s/p CABG in 2000, SVG-RCA PCI with BMS in Feb 2015, he had re-do PCI in June 2019 DES to SVG graft, ascending aortic dissection in 2000 s/p repair with AVR on warfarin. Paroxysmal A. fib. NIDDM-2, HTN and HLD presenting with 5 days of chills, nausea, vomiting, diarrhea and dyspnea on exertion and orthopnea mainly with lying on the left side. He's been found to have infective endocarditis with strep infantarius bacteremia.  He had a TEE showing large vegetations on the septal leaflet of the tricuspid valve and on the posterior disc of the aortic valve.  He's on ceftriaxone/gentamicin for his endocarditis at this point.  ID and cardiology are following.  He was seen by dental medicine 4/27 for multiple tooth extraction, alveoloplasty, debridement.  He's also been seen by surgery for a left buttocks abscess which has spontaneously drained and by orthopedics for L MTP pain and swelling which is thought to be gout.  He had an incidental aggressive appearing lesion in T10 which will be biopsied on 4/28 by IR, neurosurgery is also following for this lesion.  Dr. Cyndia Bent from [facility] planning for surgery on Monday 5/3.   Assessment & Plan:   Principal Problem:   Suspected endocarditis mechanical aortic valve Active Problems:   Diabetes mellitus type II, non insulin dependent (HCC)   Hyperlipidemia   Essential hypertension   PAF (paroxysmal atrial fibrillation) (HCC)   Hypothyroidism   Normocytic anemia   Shortness of breath   Obesity (BMI 30-39.9)   CAD (coronary artery disease)   Nausea vomiting and diarrhea   Gram-positive cocci bacteremia   Endocarditis of tricuspid valve   Infective endocarditis -tricuspid vegetation with regurgitation strep Infantarius Bacteremia:  - blood cx 4/19 strep infantarius intermediate to penicillin -  blood cx 4/20 cx NGTD - ID consulted, appreciate recommendations - continue ceftriaxone/gentamicin -Eventually will need outpatient colonoscopy - TEE-large vegetation seen on tricuspid - orthopantogram with dental caries and periodontal disease 4/21 -CT surgery tentatively planned 5/3 - 4/27 s/p multiple tooth extraction, alveoloplasty, debridement by dentistry - cardiac catheterization 4/26 -> with single vessel occlusive CAD with CTO of the ostium of the RCA, SVG to RCA with 80% in stent stenosis at the ostium, mildly elevated LV filling pressures, mild pulm HTN -> plan for redo AVR and CABG to RCA -IR-T10 spinal lesion biopsy performed 4/28-results pending  Back pain fluctuant mass around mid back -Given recent biopsy, on heparin drip and concerns of abscess.  Spoke with IR, ordered CT chest with contrast to further delineate this.  Left Buttocks Abscess: Spontaneous drainage.  Seen by general surgery.  Flagyl discontinued 4/29.  Routine dressing per surgery and wound care  Left MTP Pain, concerning for gout continue colchicine.  Seen by orthopedic no further recommendations.  Aggressive appearing lesion in T10 vertebral body: Status post biopsy 4/28-pending results Seen by neurosurgery  Nausea/vomiting/diarrhea: GI panel is negative.  Supportive management.  Iron Def Anemia:  Iron supplements with bowel regimen B12, folate-normal  CAD s/p CABG in 2000, SVG-RCA PCI with BMS in Feb 2015, he had re-do PCI in June 2019 DES to SVG graft: Echocardiogram as above. -Continue homeASA andstatin.  -Plavix on hold, last dose 4/19 -Metoprolol, losartan and Lasix on hold due to AKI and soft blood pressures  Ascending aortic dissection in 2000 s/p repair with Mechanical AVR on warfarin.  -  Heparin drip resumed -Monitor fluid status and renal function.  Paroxysmal A. fib.  -Continue heparin drip.  Uncontrolled NIDDM-2 with hyperglycemia: A1c  8.2%. -ContinueSSI-moderate -Levemir 6 units twice daily, NovoLog 6 units 3 times daily -Continue statin.   CKD stage IIIa (vancomycin, Lasix and losartan) -Baseline creatinine 1.2-1.3. -Holding metoprolol, Lasix and losartan -Continue monitoring  Essential HTN: -metoprolol, amlodipine and losartanon hold.  BP's reasonable  HLD  -Continue atorvastatin.  Hypothyroidism: -Continue home Synthroid.   Morbid obesity:Body mass index is 36.03 kg/m.With comorbidities as above. -Encourage lifestyle change to lose weight.  Would benefit from outpatient sleep study   DVT prophylaxis: Heparin drip Code Status: Full code Family Communication: None at bedside Disposition Plan:   Patient From= home  Patient Anticipated D/C place= to be determined  Barriers= plan CT surgery early next week, maintain hospital stay  Subjective: Complaining of mid back pain with swelling in that area.  No other complaints  Review of Systems Otherwise negative except as per HPI, including: General: Denies fever, chills, night sweats or unintended weight loss. Resp: Denies cough, wheezing, shortness of breath. Cardiac: Denies chest pain, palpitations, orthopnea, paroxysmal nocturnal dyspnea. GI: Denies abdominal pain, nausea, vomiting, diarrhea or constipation GU: Denies dysuria, frequency, hesitancy or incontinence MS: Denies muscle aches, joint pain or swelling Neuro: Denies headache, neurologic deficits (focal weakness, numbness, tingling), abnormal gait Psych: Denies anxiety, depression, SI/HI/AVH Skin: Denies new rashes or lesions ID: Denies sick contacts, exotic exposures, travel  Examination: Constitutional: Not in acute distress Respiratory: Clear to auscultation bilaterally Cardiovascular: Normal sinus rhythm, no rubs Abdomen: Nontender nondistended good bowel sounds Musculoskeletal: No edema noted Skin: Area of fluctuant mass in his mid back region.  Slightly warm to  touch. Neurologic: CN 2-12 grossly intact.  And nonfocal Psychiatric: Normal judgment and insight. Alert and oriented x 3. Normal mood.  Objective: Vitals:   09/20/19 0403 09/20/19 1441 09/20/19 2040 09/21/19 0314  BP: (!) 141/90 (!) 145/97 (!) 141/91 (!) 144/96  Pulse: 80 87 90 89  Resp: 17 18 18 17   Temp: 98.5 F (36.9 C) 98.3 F (36.8 C) 98.3 F (36.8 C) 98.1 F (36.7 C)  TempSrc: Oral Oral Oral Oral  SpO2: 98% 100% 98% 98%  Weight: 132.5 kg   133.1 kg  Height:        Intake/Output Summary (Last 24 hours) at 09/21/2019 1005 Last data filed at 09/21/2019 [id number] Gross per 24 hour  Intake 360 ml  Output 1025 ml  Net -665 ml   Filed Weights   09/19/19 0614 09/20/19 0403 09/21/19 0314  Weight: 131.7 kg 132.5 kg 133.1 kg     Data Reviewed:   CBC: Recent Labs  Lab 09/15/19 0530 09/16/19 0754 09/17/19 0400 09/18/19 0642 09/19/19 0320 09/20/19 0351 09/21/19 0308  WBC 8.6   < > 10.2 9.7 7.1 7.7 7.5  NEUTROABS 6.2  --  7.8*  --   --   --   --   HGB 8.9*   < > 9.2* 9.4* 8.7* 8.8* 8.6*  HCT 25.0*   < > 26.5* 27.1* 25.6* 24.8* 24.9*  MCV 82.0   < > 81.5 83.6 83.1 83.2 82.2  PLT 212   < > 226 246 195 195 171   < > = values in this interval not displayed.   Basic Metabolic Panel: Recent Labs  Lab 09/15/19 0530 09/16/19 0754 09/17/19 0400 09/18/19 0642 09/19/19 1653 09/20/19 0351 09/21/19 0308  NA 137   < > 138 137 138 139 139  K 3.8   < > 4.0 3.8 3.5 3.6 3.8  CL 106   < > 108 106 107 111 110  CO2 23   < > 20* 24 23 22 22   GLUCOSE 142*   < > 143* 143* 191* 139* 147*  BUN 13   < > 10 13 12 11 11   CREATININE 1.21   < > 1.23 1.38* 1.38* 1.28* 1.34*  CALCIUM 8.4*   < > 8.4* 8.7* 8.4* 8.2* 8.4*  MG 1.7  --  1.5* 2.0  --   --   --   PHOS 3.7  --  3.2 3.5  --   --   --    < > = values in this interval not displayed.   GFR: Estimated Creatinine Clearance: 91.5 mL/min (A) (by C-G formula based on SCr of 1.34 mg/dL (H)). Liver Function Tests: Recent Labs  Lab  09/15/19 0530 09/17/19 0400 09/18/19 0642  AST 21 23 22   ALT 13 12 11   ALKPHOS 67 67 68  BILITOT 1.1 1.0 0.7  PROT 6.6 7.5 7.7  ALBUMIN 2.4* 2.6* 2.7*   No results for input(s): LIPASE, AMYLASE in the last 168 hours. No results for input(s): AMMONIA in the last 168 hours. Coagulation Profile: Recent Labs  Lab 09/17/19 0400 09/18/19 0642 09/19/19 0320 09/20/19 0351 09/21/19 0308  INR 1.6* 1.8* 1.7* 1.7* 1.6*   Cardiac Enzymes: No results for input(s): CKTOTAL, CKMB, CKMBINDEX, TROPONINI in the last 168 hours. BNP (last 3 results) No results for input(s): PROBNP in the last 8760 hours. HbA1C: No results for input(s): HGBA1C in the last 72 hours. CBG: Recent Labs  Lab 09/20/19 0735 09/20/19 1121 09/20/19 1617 09/20/19 2133 09/21/19 0740  GLUCAP 145* 232* 115* 161* 117*   Lipid Profile: No results for input(s): CHOL, HDL, LDLCALC, TRIG, CHOLHDL, LDLDIRECT in the last 72 hours. Thyroid Function Tests: No results for input(s): TSH, T4TOTAL, FREET4, T3FREE, THYROIDAB in the last 72 hours. Anemia Panel: No results for input(s): VITAMINB12, FOLATE, FERRITIN, TIBC, IRON, RETICCTPCT in the last 72 hours. Sepsis Labs: No results for input(s): PROCALCITON, LATICACIDVEN in the last 168 hours.  Recent Results (from the past 240 hour(s))  Surgical PCR screen     Status: None   Collection Time: 09/16/19  3:34 PM   Specimen: Nasal Mucosa; Nasal Swab  Result Value Ref Range Status   MRSA, PCR NEGATIVE NEGATIVE Final   Staphylococcus aureus NEGATIVE NEGATIVE Final    Comment: (NOTE) The Xpert SA Assay (FDA approved for NASAL specimens in patients 66 years of age and older), is one component of a comprehensive surveillance program. It is not intended to diagnose infection nor to guide or monitor treatment. Performed at [facility], [place] [address], [place], [place] [address]          Radiology Studies: No results found.      Scheduled Meds: . aspirin EC   81 mg Oral Daily  . atorvastatin  40 mg Oral Q breakfast  . colchicine  0.6 mg Oral BID  . [START ON 09/23/2019] epinephrine  0-10 mcg/min Intravenous To OR  . feeding supplement  1 Container Oral TID BM  . feeding supplement (PRO-STAT SUGAR FREE 64)  30 mL Oral BID  . gabapentin  300 mg Oral Q breakfast  . [START ON 09/23/2019] heparin-papaverine-plasmalyte irrigation   Irrigation To OR  . insulin aspart  0-15 Units Subcutaneous TID WC  . insulin aspart  0-5 Units Subcutaneous QHS  .  insulin aspart  6 Units Subcutaneous TID WC  . insulin detemir  6 Units Subcutaneous BID  . [START ON 09/23/2019] insulin   Intravenous To OR  . levothyroxine  50 mcg Oral Q0600  . [START ON 09/23/2019] magnesium sulfate  40 mEq Other To OR  . [START ON 09/23/2019] phenylephrine  30-200 mcg/min Intravenous To OR  . polyethylene glycol  17 g Oral BID  . [START ON 09/23/2019] potassium chloride  80 mEq Other To OR  . sodium chloride flush  3 mL Intravenous Once  . sodium chloride flush  3 mL Intravenous Q12H  . [START ON 09/23/2019] tranexamic acid  15 mg/kg Intravenous To OR  . [START ON 09/23/2019] tranexamic acid  2 mg/kg Intracatheter To OR   Continuous Infusions: . sodium chloride    . cefTRIAXone (ROCEPHIN)  IV 2 g (09/21/19 0857)  . [START ON 09/23/2019] cefUROXime (ZINACEF)  IV    . [START ON 09/23/2019] cefUROXime (ZINACEF)  IV    . [START ON 09/23/2019] dexmedetomidine    . gentamicin 220 mg (09/21/19 [id number])  . [START ON 09/23/2019] heparin 30,000 units/NS 1000 mL solution for CELLSAVER    . heparin 2,050 Units/hr (09/21/19 0159)  . lactated ringers    . [START ON 09/23/2019] milrinone    . [START ON 09/23/2019] nitroGLYCERIN    . [START ON 09/23/2019] norepinephrine    . [START ON 09/23/2019] tranexamic acid (CYKLOKAPRON) infusion ([facility])    . [START ON 09/23/2019] tranexamic acid (CYKLOKAPRON) infusion ([facility])    . [START ON 09/23/2019] vancomycin       LOS: 13 days   Time spent=35 mins    Tresia Revolorio Arsenio Loader, MD [facility]  If 7PM-7AM, please contact night-coverage  09/21/2019, 10:05 AM

## 2019-09-21 NOTE — Progress Notes (Signed)
Pharmacy Antibiotic Note  [name] is a 54 y.o. 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   Pharmacy has been consulted for gentamicin dosing for synergy.  Pt has been on ceftriaxone and gent for his native and prosthetic valve endocarditis with strep infantarius. Gentamicin was adjusted on 4/29/ Scr remains stable at 1.34, afebrile. Off metronidazole. Plan for CABG + AVR on 5/3.  Plan: Continue gentamicin at 220mg  IV q24 Continue ceftriaxone 2 gm every 24 hours Monitor strep susceptibilities, management plan, renal function  Monitor gentamicin trough at steady state (Goal <1)   Height: 6\' 2"  (188 cm) Weight: 133.1 kg (293 lb 6.9 oz) IBW/kg (Calculated) : 82.2  Temp (24hrs), Avg:98.2 F (36.8 C), Min:98.1 F (36.7 C), Max:98.3 F (36.8 C)  Recent Labs  Lab 09/15/19 1927 09/16/19 0754 09/17/19 0400 09/18/19 0642 09/18/19 1851 09/19/19 0320 09/19/19 1653 09/20/19 0351 09/21/19 0308  WBC  --    < > 10.2 9.7  --  7.1  --  7.7 7.5  CREATININE  --    < > 1.23 1.38*  --   --  1.38* 1.28* 1.34*  GENTTROUGH 5.5*  --   --   --  1.9  --   --   --   --    < > = values in this interval not displayed.    Estimated Creatinine Clearance: 91.5 mL/min (A) (by C-G formula based on SCr of 1.34 mg/dL (H)).    Allergies  Allergen Reactions  . Diltiazem Hives  . Insulin Glargine Swelling    edema    Antimicrobials this admission: 4/19 Vanc>> 4/21 4/19 ceftriaxone >> 4/21 gentamicin>>  Microbiology results: 4/19 BCx: GPCs in clusters 2/2>>STREPTOCOCCUS INFANTARIUS  4/20: bld x2 - ngtdF  Antonietta Jewel, PharmD, [facility] Clinical Pharmacist  Phone: [phone] 09/21/2019 8:14 AM  Please check AMION for all [place] phone numbers After 10:00 PM, call [place] [phone]

## 2019-09-21 NOTE — Progress Notes (Signed)
Midline placed in R cephalic for CT and lab draws. L arm with extensive bruising. Pt reports tenderness and pain in LUE.

## 2019-09-21 NOTE — Progress Notes (Signed)
Progress Note  Patient Name: [name] Date of Encounter: 09/21/2019  Primary Cardiologist: Kirk Ruths, MD   Subjective   No CP or dyspnea; complains of back pain.  Inpatient Medications    Scheduled Meds: . aspirin EC  81 mg Oral Daily  . atorvastatin  40 mg Oral Q breakfast  . colchicine  0.6 mg Oral BID  . [START ON 09/23/2019] epinephrine  0-10 mcg/min Intravenous To OR  . feeding supplement  1 Container Oral TID BM  . feeding supplement (PRO-STAT SUGAR FREE 64)  30 mL Oral BID  . gabapentin  300 mg Oral Q breakfast  . [START ON 09/23/2019] heparin-papaverine-plasmalyte irrigation   Irrigation To OR  . insulin aspart  0-15 Units Subcutaneous TID WC  . insulin aspart  0-5 Units Subcutaneous QHS  . insulin aspart  6 Units Subcutaneous TID WC  . insulin detemir  6 Units Subcutaneous BID  . [START ON 09/23/2019] insulin   Intravenous To OR  . levothyroxine  50 mcg Oral Q0600  . [START ON 09/23/2019] magnesium sulfate  40 mEq Other To OR  . mupirocin ointment  1 application Nasal BID  . [START ON 09/23/2019] phenylephrine  30-200 mcg/min Intravenous To OR  . polyethylene glycol  17 g Oral BID  . [START ON 09/23/2019] potassium chloride  80 mEq Other To OR  . sodium chloride flush  3 mL Intravenous Once  . sodium chloride flush  3 mL Intravenous Q12H  . [START ON 09/23/2019] tranexamic acid  15 mg/kg Intravenous To OR  . [START ON 09/23/2019] tranexamic acid  2 mg/kg Intracatheter To OR   Continuous Infusions: . sodium chloride    . cefTRIAXone (ROCEPHIN)  IV 2 g (09/20/19 1103)  . [START ON 09/23/2019] cefUROXime (ZINACEF)  IV    . [START ON 09/23/2019] cefUROXime (ZINACEF)  IV    . [START ON 09/23/2019] dexmedetomidine    . gentamicin 220 mg (09/21/19 [id number])  . [START ON 09/23/2019] heparin 30,000 units/NS 1000 mL solution for CELLSAVER    . heparin 2,050 Units/hr (09/21/19 0159)  . lactated ringers    . [START ON 09/23/2019] milrinone    . [START ON 09/23/2019] nitroGLYCERIN    .  [START ON 09/23/2019] norepinephrine    . [START ON 09/23/2019] tranexamic acid (CYKLOKAPRON) infusion ([facility])    . [START ON 09/23/2019] tranexamic acid (CYKLOKAPRON) infusion ([facility])    . [START ON 09/23/2019] vancomycin     PRN Meds: sodium chloride, acetaminophen **OR** acetaminophen, HYDROcodone-acetaminophen, nitroGLYCERIN, ondansetron **OR** ondansetron (ZOFRAN) IV, sodium chloride flush   Vital Signs    Vitals:   09/20/19 0403 09/20/19 1441 09/20/19 2040 09/21/19 0314  BP: (!) 141/90 (!) 145/97 (!) 141/91 (!) 144/96  Pulse: 80 87 90 89  Resp: 17 18 18 17   Temp: 98.5 F (36.9 C) 98.3 F (36.8 C) 98.3 F (36.8 C) 98.1 F (36.7 C)  TempSrc: Oral Oral Oral Oral  SpO2: 98% 100% 98% 98%  Weight: 132.5 kg   133.1 kg  Height:        Intake/Output Summary (Last 24 hours) at 09/21/2019 0833 Last data filed at 09/21/2019 [id number] Gross per 24 hour  Intake 870 ml  Output 1025 ml  Net -155 ml   Last 3 Weights 09/21/2019 09/20/2019 09/19/2019  Weight (lbs) 293 lb 6.9 oz 292 lb 1.8 oz 290 lb 6.4 oz  Weight (kg) 133.1 kg 132.5 kg 131.725 kg      Telemetry    Sinus - Personally  Reviewed  Physical Exam   GEN: No acute distress.   Neck: No JVD Cardiac: RRR, 2/6 systolic murmur left sternal border. Respiratory: Clear to auscultation bilaterally. GI: Soft, nontender, non-distended  MS: No edema Back: Small fluctuant area at site of previous biopsy. Neuro:  Nonfocal  Psych: Normal affect   Labs    High Sensitivity Troponin:   Recent Labs  Lab 09/08/19 0810 09/08/19 1100  TROPONINIHS 34* 28*      Chemistry Recent Labs  Lab 09/15/19 0530 09/16/19 0754 09/17/19 0400 09/17/19 0400 09/18/19 0642 09/18/19 0642 09/19/19 1653 09/20/19 0351 09/21/19 0308  NA 137   < > 138   < > 137   < > 138 139 139  K 3.8   < > 4.0   < > 3.8   < > 3.5 3.6 3.8  CL 106   < > 108   < > 106   < > 107 111 110  CO2 23   < > 20*   < > 24   < > 23 22 22   GLUCOSE 142*   < > 143*   < > 143*   < > 191* 139*  147*  BUN 13   < > 10   < > 13   < > 12 11 11   CREATININE 1.21   < > 1.23   < > 1.38*   < > 1.38* 1.28* 1.34*  CALCIUM 8.4*   < > 8.4*   < > 8.7*   < > 8.4* 8.2* 8.4*  PROT 6.6  --  7.5  --  7.7  --   --   --   --   ALBUMIN 2.4*  --  2.6*  --  2.7*  --   --   --   --   AST 21  --  23  --  22  --   --   --   --   ALT 13  --  12  --  11  --   --   --   --   ALKPHOS 67  --  67  --  68  --   --   --   --   BILITOT 1.1  --  1.0  --  0.7  --   --   --   --   GFRNONAA >60   < > >60   < > 58*   < > 58* >60 60*  GFRAA >60   < > >60   < > >60   < > >60 >60 >60  ANIONGAP 8   < > 10   < > 7   < > 8 6 7    < > = values in this interval not displayed.     Hematology Recent Labs  Lab 09/19/19 0320 09/20/19 0351 09/21/19 0308  WBC 7.1 7.7 7.5  RBC 3.08* 2.98* 3.03*  HGB 8.7* 8.8* 8.6*  HCT 25.6* 24.8* 24.9*  MCV 83.1 83.2 82.2  MCH 28.2 29.5 28.4  MCHC 34.0 35.5 34.5  RDW 14.9 15.0 15.0  PLT 195 195 171     Patient Profile     54 y.o. 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 Additional issues:lytic lesion in his thoracic spine (T10), s/p biopsy on 04/28.Numerous dental problems, likely source of endocarditis, s/pdentalextractions 04/27.Gluteal area furuncle, spontaneously  drained 4/23, currently packed. Steadily lengthening PR interval on ECG raises concern for periannular abscess formation.  Cardiac catheterization this admission shows occluded right coronary artery and 80% stenosis in the saphenous vein graft to the RCA.  Transesophageal echocardiogram showed normal LV function, severe right atrial enlargement, mild mitral regurgitation, large vegetation on the tricuspid valve with moderate tricuspid regurgitation, large vegetation on the prosthetic aortic valve without  evidence of periannular abscess, trace aortic insufficiency.  Assessment & Plan    1 endocarditis-plan is for redo aortic valve replacement on May 3.  Continue antibiotics at present dose.  PR interval is prolonged raising concern for abscess.  Will need to follow closely postoperatively as patient may require pacemaker.  2 coronary artery disease-patient will have redo coronary artery bypass and graft to the right coronary artery at time of surgery.  Continue aspirin and statin.  3 paroxysmal atrial fibrillation-no recurrences.  Continue heparin at present dose.  4 T10 lytic lesion-awaiting results of biopsy.  Note patient has a small fluctuant area at site of biopsy.  May need to have this evaluated/drained prior to aortic valve surgery.  5 acute on chronic stage III kidney disease-follow renal function closely as he is receiving gentamicin.  For questions or updates, please contact [facility] [place] Please consult [email or web address] for contact info under        Signed, Kirk Ruths, MD  09/21/2019, 8:33 AM

## 2019-09-22 DIAGNOSIS — I079 Rheumatic tricuspid valve disease, unspecified: Secondary | ICD-10-CM | POA: Diagnosis not present

## 2019-09-22 LAB — CBC
HCT: 25.7 % — ABNORMAL LOW (ref 39.0–52.0)
Hemoglobin: 8.8 g/dL — ABNORMAL LOW (ref 13.0–17.0)
MCH: 28.8 pg (ref 26.0–34.0)
MCHC: 34.2 g/dL (ref 30.0–36.0)
MCV: 84 fL (ref 80.0–100.0)
Platelets: 184 10*3/uL (ref 150–400)
RBC: 3.06 MIL/uL — ABNORMAL LOW (ref 4.22–5.81)
RDW: 15.9 % — ABNORMAL HIGH (ref 11.5–15.5)
WBC: 6.9 10*3/uL (ref 4.0–10.5)
nRBC: 0 % (ref 0.0–0.2)

## 2019-09-22 LAB — HEPARIN LEVEL (UNFRACTIONATED)
Heparin Unfractionated: 0.7 IU/mL (ref 0.30–0.70)
Heparin Unfractionated: 0.55 IU/mL (ref 0.30–0.70)

## 2019-09-22 LAB — BASIC METABOLIC PANEL
Anion gap: 7 (ref 5–15)
BUN: 12 mg/dL (ref 6–20)
CO2: 23 mmol/L (ref 22–32)
Calcium: 8.7 mg/dL — ABNORMAL LOW (ref 8.9–10.3)
Chloride: 109 mmol/L (ref 98–111)
Creatinine, Ser: 1.38 mg/dL — ABNORMAL HIGH (ref 0.61–1.24)
GFR calc Af Amer: >60 mL/min (ref >60)
GFR calc non Af Amer: 58 mL/min — ABNORMAL LOW (ref >60)
Glucose, Bld: 136 mg/dL — ABNORMAL HIGH (ref 70–99)
Potassium: 3.7 mmol/L (ref 3.5–5.1)
Sodium: 139 mmol/L (ref 135–145)

## 2019-09-22 LAB — GLUCOSE, CAPILLARY
Glucose-Capillary: 127 mg/dL — ABNORMAL HIGH (ref 70–99)
Glucose-Capillary: 169 mg/dL — ABNORMAL HIGH (ref 70–99)
Glucose-Capillary: 168 mg/dL — ABNORMAL HIGH (ref 70–99)
Glucose-Capillary: 120 mg/dL — ABNORMAL HIGH (ref 70–99)

## 2019-09-22 LAB — PROTIME-INR
INR: 1.4 — ABNORMAL HIGH (ref 0.8–1.2)
Prothrombin Time: 16.4 seconds — ABNORMAL HIGH (ref 11.4–15.2)

## 2019-09-22 MED ORDER — DIAZEPAM 5 MG PO TABS
5.0000 mg | ORAL_TABLET | Freq: Once | ORAL | Status: AC
  Administered 2019-09-23: 5 mg via ORAL
  Filled 2019-09-22: qty 1

## 2019-09-22 MED ORDER — BISACODYL 5 MG PO TBEC
5.0000 mg | DELAYED_RELEASE_TABLET | Freq: Once | ORAL | Status: AC
  Administered 2019-09-22: 5 mg via ORAL
  Filled 2019-09-22: qty 1

## 2019-09-22 MED ORDER — CHLORHEXIDINE GLUCONATE 0.12 % MT SOLN
15.0000 mL | Freq: Once | OROMUCOSAL | Status: AC
  Administered 2019-09-23: 15 mL via OROMUCOSAL
  Filled 2019-09-22: qty 15

## 2019-09-22 MED ORDER — METOPROLOL TARTRATE 12.5 MG HALF TABLET
12.5000 mg | ORAL_TABLET | Freq: Once | ORAL | Status: AC
  Administered 2019-09-23: 12.5 mg via ORAL
  Filled 2019-09-22: qty 1

## 2019-09-22 MED ORDER — TEMAZEPAM 15 MG PO CAPS
15.0000 mg | ORAL_CAPSULE | Freq: Once | ORAL | Status: AC | PRN
  Administered 2019-09-22: 15 mg via ORAL
  Filled 2019-09-22: qty 1

## 2019-09-22 MED ORDER — CHLORHEXIDINE GLUCONATE CLOTH 2 % EX PADS: 6.0000 | MEDICATED_PAD | Freq: Once | CUTANEOUS | Status: DC

## 2019-09-22 MED ORDER — CHLORHEXIDINE GLUCONATE CLOTH 2 % EX PADS
6.0000 | MEDICATED_PAD | Freq: Once | CUTANEOUS | Status: AC
  Administered 2019-09-22: 6 via TOPICAL

## 2019-09-22 NOTE — Progress Notes (Signed)
PROGRESS NOTE    [name]  [id number] DOB: November 10, 1965 DOA: 09/08/2019 PCP: Biagio Borg, MD   Brief Narrative:  54 y.o.malewith history of CAD s/p CABG in 2000, SVG-RCA PCI with BMS in Feb 2015, he had re-do PCI in June 2019 DES to SVG graft, ascending aortic dissection in 2000 s/p repair with AVR on warfarin. Paroxysmal A. fib. NIDDM-2, HTN and HLD presenting with 5 days of chills, nausea, vomiting, diarrhea and dyspnea on exertion and orthopnea mainly with lying on the left side. He's been found to have infective endocarditis with strep infantarius bacteremia.  He had a TEE showing large vegetations on the septal leaflet of the tricuspid valve and on the posterior disc of the aortic valve.  He's on ceftriaxone/gentamicin for his endocarditis at this point.  ID and cardiology are following.  He was seen by dental medicine 4/27 for multiple tooth extraction, alveoloplasty, debridement.  He's also been seen by surgery for a left buttocks abscess which has spontaneously drained and by orthopedics for L MTP pain and swelling which is thought to be gout.  He had an incidental aggressive appearing lesion in T10 which will be biopsied on 4/28 by IR, neurosurgery is also following for this lesion.  Dr. Cyndia Bent from [facility] planning for surgery on Monday 5/3.   Assessment & Plan:   Principal Problem:   Suspected endocarditis mechanical aortic valve Active Problems:   Diabetes mellitus type II, non insulin dependent (HCC)   Hyperlipidemia   Essential hypertension   PAF (paroxysmal atrial fibrillation) (HCC)   Hypothyroidism   Normocytic anemia   Shortness of breath   Obesity (BMI 30-39.9)   CAD (coronary artery disease)   Nausea vomiting and diarrhea   Gram-positive cocci bacteremia   Endocarditis of tricuspid valve   Infective endocarditis -tricuspid vegetation with regurgitation strep Infantarius Bacteremia:  - blood cx 4/19 strep infantarius intermediate to penicillin -  blood cx 4/20 cx NGTD - ID consulted, appreciate recommendations - continue ceftriaxone/gentamicin -Eventually will need outpatient colonoscopy - TEE-large vegetation seen on tricuspid - orthopantogram with dental caries and periodontal disease 4/21 -CT surgery tentatively planned 5/3, tomorrow - 4/27 s/p multiple tooth extraction, alveoloplasty, debridement by dentistry - cardiac catheterization 4/26 -> with single vessel occlusive CAD with CTO of the ostium of the RCA, SVG to RCA with 80% in stent stenosis at the ostium, mildly elevated LV filling pressures, mild pulm HTN -> plan for redo AVR and CABG to RCA -IR-T10 spinal lesion biopsy performed 4/28-results pending  Back pain fluctuant mass around mid back, hematoma -CT chest performed, no evidence of active bleeding.  Small hematoma secondary to mild trauma during biopsy while being on heparin drip.  Left Buttocks Abscess: Spontaneous drainage.  Seen by general surgery.  Flagyl discontinued 4/29.  Routine dressing per surgery and wound care  Left MTP Pain, concerning for gout continue colchicine.  Seen by orthopedic no further recommendations.  Aggressive appearing lesion in T10 vertebral body: Status post biopsy 4/28-pending results Seen by neurosurgery  Nausea/vomiting/diarrhea: GI panel is negative.  Supportive management.  Iron Def Anemia:  Iron supplements with bowel regimen B12, folate-normal  CAD s/p CABG in 2000, SVG-RCA PCI with BMS in Feb 2015, he had re-do PCI in June 2019 DES to SVG graft: Echocardiogram as above. -Continue homeASA andstatin.  -Plavix on hold, last dose 4/19 -Metoprolol, losartan and Lasix on hold due to AKI and soft blood pressures  Ascending aortic dissection in 2000 s/p repair with Mechanical AVR on  warfarin.  -Heparin drip resumed -Monitor fluid status and renal function.  Paroxysmal A. fib.  -Continue heparin drip.  Uncontrolled NIDDM-2 with hyperglycemia: A1c  8.2%. -ContinueSSI-moderate -Levemir 6 units twice daily, NovoLog 6 units 3 times daily -Continue statin.   CKD stage IIIa (vancomycin, Lasix and losartan) -Baseline creatinine 1.2-1.3. -Holding metoprolol, Lasix and losartan -Continue monitoring  Essential HTN: -metoprolol, amlodipine and losartanon hold.  BP's reasonable  HLD  -Continue atorvastatin.  Hypothyroidism: -Continue home Synthroid.   Morbid obesity:Body mass index is 36.03 kg/m.With comorbidities as above. -Encourage lifestyle change to lose weight.  Would benefit from outpatient sleep study   DVT prophylaxis: Heparin drip Code Status: Full code Family Communication: None at bedside Disposition Plan:   Patient From= home  Patient Anticipated D/C place= to be determined  Barriers= plan CT surgery early next week, maintain hospital stay  Subjective: Feels okay overall.  Reports of intermittent bleeding from his gums while being on heparin drip.  Review of Systems Otherwise negative except as per HPI, including: General: Denies fever, chills, night sweats or unintended weight loss. Resp: Denies cough, wheezing, shortness of breath. Cardiac: Denies chest pain, palpitations, orthopnea, paroxysmal nocturnal dyspnea. GI: Denies abdominal pain, nausea, vomiting, diarrhea or constipation GU: Denies dysuria, frequency, hesitancy or incontinence MS: Denies muscle aches, joint pain or swelling Neuro: Denies headache, neurologic deficits (focal weakness, numbness, tingling), abnormal gait Psych: Denies anxiety, depression, SI/HI/AVH Skin: Denies new rashes or lesions ID: Denies sick contacts, exotic exposures, travel  Examination: Constitutional: Not in acute distress, mild bleeding gums. Respiratory: Clear to auscultation bilaterally Cardiovascular: Normal sinus rhythm, no rubs Abdomen: Nontender nondistended good bowel sounds Musculoskeletal: No edema noted Skin: No rashes seen Neurologic: CN 2-12  grossly intact.  And nonfocal Psychiatric: Normal judgment and insight. Alert and oriented x 3. Normal mood.  Objective: Vitals:   09/21/19 0314 09/21/19 1505 09/21/19 2008 09/22/19 0509  BP: (!) 144/96 (!) 147/94 128/80 125/78  Pulse: 89 87 88   Resp: 17  19 20   Temp: 98.1 F (36.7 C) 97.6 F (36.4 C) 98.3 F (36.8 C) 98.5 F (36.9 C)  TempSrc: Oral Oral Oral Oral  SpO2: 98% 100% 99% 98%  Weight: 133.1 kg   132.1 kg  Height:        Intake/Output Summary (Last 24 hours) at 09/22/2019 1027 Last data filed at 09/22/2019 0530 Gross per 24 hour  Intake 10 ml  Output 800 ml  Net -790 ml   Filed Weights   09/20/19 0403 09/21/19 0314 09/22/19 0509  Weight: 132.5 kg 133.1 kg 132.1 kg     Data Reviewed:   CBC: Recent Labs  Lab 09/17/19 0400 09/17/19 0400 09/18/19 0642 09/19/19 0320 09/20/19 0351 09/21/19 0308 09/22/19 0541  WBC 10.2   < > 9.7 7.1 7.7 7.5 6.9  NEUTROABS 7.8*  --   --   --   --   --   --   HGB 9.2*   < > 9.4* 8.7* 8.8* 8.6* 8.8*  HCT 26.5*   < > 27.1* 25.6* 24.8* 24.9* 25.7*  MCV 81.5   < > 83.6 83.1 83.2 82.2 84.0  PLT 226   < > 246 195 195 171 184   < > = values in this interval not displayed.   Basic Metabolic Panel: Recent Labs  Lab 09/17/19 0400 09/17/19 0400 09/18/19 0642 09/19/19 1653 09/20/19 0351 09/21/19 0308 09/22/19 0541  NA 138   < > 137 138 139 139 139  K 4.0   < >  3.8 3.5 3.6 3.8 3.7  CL 108   < > 106 107 111 110 109  CO2 20*   < > 24 23 22 22 23   GLUCOSE 143*   < > 143* 191* 139* 147* 136*  BUN 10   < > 13 12 11 11 12   CREATININE 1.23   < > 1.38* 1.38* 1.28* 1.34* 1.38*  CALCIUM 8.4*   < > 8.7* 8.4* 8.2* 8.4* 8.7*  MG 1.5*  --  2.0  --   --   --   --   PHOS 3.2  --  3.5  --   --   --   --    < > = values in this interval not displayed.   GFR: Estimated Creatinine Clearance: 88.5 mL/min (A) (by C-G formula based on SCr of 1.38 mg/dL (H)). Liver Function Tests: Recent Labs  Lab 09/17/19 0400 09/18/19 0642  AST 23 22    ALT 12 11  ALKPHOS 67 68  BILITOT 1.0 0.7  PROT 7.5 7.7  ALBUMIN 2.6* 2.7*   No results for input(s): LIPASE, AMYLASE in the last 168 hours. No results for input(s): AMMONIA in the last 168 hours. Coagulation Profile: Recent Labs  Lab 09/18/19 0642 09/19/19 0320 09/20/19 0351 09/21/19 0308 09/22/19 0541  INR 1.8* 1.7* 1.7* 1.6* 1.4*   Cardiac Enzymes: No results for input(s): CKTOTAL, CKMB, CKMBINDEX, TROPONINI in the last 168 hours. BNP (last 3 results) No results for input(s): PROBNP in the last 8760 hours. HbA1C: No results for input(s): HGBA1C in the last 72 hours. CBG: Recent Labs  Lab 09/21/19 0740 09/21/19 1211 09/21/19 1631 09/21/19 2211 09/22/19 0736  GLUCAP 117* 117* 116* 174* 127*   Lipid Profile: No results for input(s): CHOL, HDL, LDLCALC, TRIG, CHOLHDL, LDLDIRECT in the last 72 hours. Thyroid Function Tests: No results for input(s): TSH, T4TOTAL, FREET4, T3FREE, THYROIDAB in the last 72 hours. Anemia Panel: No results for input(s): VITAMINB12, FOLATE, FERRITIN, TIBC, IRON, RETICCTPCT in the last 72 hours. Sepsis Labs: No results for input(s): PROCALCITON, LATICACIDVEN in the last 168 hours.  Recent Results (from the past 240 hour(s))  Surgical PCR screen     Status: None   Collection Time: 09/16/19  3:34 PM   Specimen: Nasal Mucosa; Nasal Swab  Result Value Ref Range Status   MRSA, PCR NEGATIVE NEGATIVE Final   Staphylococcus aureus NEGATIVE NEGATIVE Final    Comment: (NOTE) The Xpert SA Assay (FDA approved for NASAL specimens in patients 28 years of age and older), is one component of a comprehensive surveillance program. It is not intended to diagnose infection nor to guide or monitor treatment. Performed at [facility], [place] [address] [place], [place] [address]          Radiology Studies: CT CHEST W CONTRAST  Addendum Date: 09/21/2019   ADDENDUM REPORT: 09/21/2019 18:33 ADDENDUM: In the soft tissues posterior to the T10  vertebral body there is a small 3.4 x 1.5 cm collection in the subcutaneous fat. This is consistent with a small post biopsy hematoma. There is no evidence for active extravasation. Electronically Signed   By: Constance Holster M.D.   On: 09/21/2019 18:33   Result Date: 09/21/2019 CLINICAL DATA:  Chest pain. Shortness of breath. Concern for pleural effusion. EXAM: CT CHEST WITH CONTRAST TECHNIQUE: Multidetector CT imaging of the chest was performed during intravenous contrast administration. CONTRAST:  15mL OMNIPAQUE IOHEXOL 300 MG/ML  SOLN COMPARISON:  09/10/2019 FINDINGS: Cardiovascular: The patient is again  status Bentall procedure. A chronic dissection is again noted involving the aortic arch. The heart size is stable but enlarged. The main pulmonary artery is dilated but stable from prior study. Aortic calcifications are noted. Atherosclerotic changes are noted of the thoracic aorta. There is no pericardial effusion. Mediastinum/Nodes: --No mediastinal or hilar lymphadenopathy. --No axillary lymphadenopathy. --No supraclavicular lymphadenopathy. --Normal thyroid gland. --The esophagus is unremarkable Lungs/Pleura: No pulmonary nodules or masses. No pleural effusion or pneumothorax. No focal airspace consolidation. No focal pleural abnormality. Upper Abdomen: There is cholelithiasis. Musculoskeletal: Again noted is a lytic lesion involving the T10 vertebral body. This lesion extends into the posterior tenth rib on the right. Subtle additional lytic lesions are noted in various osseous structures including the anterior fifth rib on the left. Review of the MIP images confirms the above findings. IMPRESSION: 1. No acute abnormality. 2. Again noted is an aggressive appearing lytic lesion in the T10 vertebral body. Additional smaller subtle lytic lesions are noted involving the ribs, especially the anterior left fifth rib. 3. Cholelithiasis. Aortic Atherosclerosis (ICD10-I70.0). Electronically Signed: By:  Constance Holster M.D. On: 09/21/2019 17:10        Scheduled Meds: . aspirin EC  81 mg Oral Daily  . atorvastatin  40 mg Oral Q breakfast  . colchicine  0.6 mg Oral BID  . [START ON 09/23/2019] epinephrine  0-10 mcg/min Intravenous To OR  . feeding supplement  1 Container Oral TID BM  . feeding supplement (PRO-STAT SUGAR FREE 64)  30 mL Oral BID  . gabapentin  300 mg Oral Q breakfast  . [START ON 09/23/2019] heparin-papaverine-plasmalyte irrigation   Irrigation To OR  . insulin aspart  0-15 Units Subcutaneous TID WC  . insulin aspart  0-5 Units Subcutaneous QHS  . insulin aspart  6 Units Subcutaneous TID WC  . insulin detemir  6 Units Subcutaneous BID  . [START ON 09/23/2019] insulin   Intravenous To OR  . levothyroxine  50 mcg Oral Q0600  . [START ON 09/23/2019] magnesium sulfate  40 mEq Other To OR  . [START ON 09/23/2019] phenylephrine  30-200 mcg/min Intravenous To OR  . polyethylene glycol  17 g Oral BID  . [START ON 09/23/2019] potassium chloride  80 mEq Other To OR  . sodium chloride flush  10-40 mL Intracatheter Q12H  . sodium chloride flush  3 mL Intravenous Once  . sodium chloride flush  3 mL Intravenous Q12H  . [START ON 09/23/2019] tranexamic acid  15 mg/kg Intravenous To OR  . [START ON 09/23/2019] tranexamic acid  2 mg/kg Intracatheter To OR   Continuous Infusions: . sodium chloride    . cefTRIAXone (ROCEPHIN)  IV 2 g (09/22/19 0855)  . [START ON 09/23/2019] cefUROXime (ZINACEF)  IV    . [START ON 09/23/2019] cefUROXime (ZINACEF)  IV    . [START ON 09/23/2019] dexmedetomidine    . gentamicin 220 mg (09/22/19 0527)  . [START ON 09/23/2019] heparin 30,000 units/NS 1000 mL solution for CELLSAVER    . heparin 2,000 Units/hr (09/22/19 0805)  . lactated ringers    . [START ON 09/23/2019] milrinone    . [START ON 09/23/2019] nitroGLYCERIN    . [START ON 09/23/2019] norepinephrine    . [START ON 09/23/2019] tranexamic acid (CYKLOKAPRON) infusion ([facility])    . [START ON 09/23/2019] tranexamic acid  (CYKLOKAPRON) infusion ([facility])    . [START ON 09/23/2019] vancomycin       LOS: 14 days   Time spent=35 mins    Dayron Odland Chirag  Reesa Chew, MD [facility]  If 7PM-7AM, please contact night-coverage  09/22/2019, 10:27 AM

## 2019-09-22 NOTE — Progress Notes (Signed)
Progress Note  Patient Name: [name] Date of Encounter: 09/22/2019  Primary Cardiologist: Kirk Ruths, MD   Subjective   Pt denies CP or dyspnea; some improvement in back pain  Inpatient Medications    Scheduled Meds: . aspirin EC  81 mg Oral Daily  . atorvastatin  40 mg Oral Q breakfast  . colchicine  0.6 mg Oral BID  . [START ON 09/23/2019] epinephrine  0-10 mcg/min Intravenous To OR  . feeding supplement  1 Container Oral TID BM  . feeding supplement (PRO-STAT SUGAR FREE 64)  30 mL Oral BID  . gabapentin  300 mg Oral Q breakfast  . [START ON 09/23/2019] heparin-papaverine-plasmalyte irrigation   Irrigation To OR  . insulin aspart  0-15 Units Subcutaneous TID WC  . insulin aspart  0-5 Units Subcutaneous QHS  . insulin aspart  6 Units Subcutaneous TID WC  . insulin detemir  6 Units Subcutaneous BID  . [START ON 09/23/2019] insulin   Intravenous To OR  . levothyroxine  50 mcg Oral Q0600  . [START ON 09/23/2019] magnesium sulfate  40 mEq Other To OR  . [START ON 09/23/2019] phenylephrine  30-200 mcg/min Intravenous To OR  . polyethylene glycol  17 g Oral BID  . [START ON 09/23/2019] potassium chloride  80 mEq Other To OR  . sodium chloride flush  10-40 mL Intracatheter Q12H  . sodium chloride flush  3 mL Intravenous Once  . sodium chloride flush  3 mL Intravenous Q12H  . [START ON 09/23/2019] tranexamic acid  15 mg/kg Intravenous To OR  . [START ON 09/23/2019] tranexamic acid  2 mg/kg Intracatheter To OR   Continuous Infusions: . sodium chloride    . cefTRIAXone (ROCEPHIN)  IV 2 g (09/21/19 0857)  . [START ON 09/23/2019] cefUROXime (ZINACEF)  IV    . [START ON 09/23/2019] cefUROXime (ZINACEF)  IV    . [START ON 09/23/2019] dexmedetomidine    . gentamicin 220 mg (09/22/19 0527)  . [START ON 09/23/2019] heparin 30,000 units/NS 1000 mL solution for CELLSAVER    . heparin 2,050 Units/hr (09/22/19 0317)  . lactated ringers    . [START ON 09/23/2019] milrinone    . [START ON 09/23/2019]  nitroGLYCERIN    . [START ON 09/23/2019] norepinephrine    . [START ON 09/23/2019] tranexamic acid (CYKLOKAPRON) infusion ([facility])    . [START ON 09/23/2019] tranexamic acid (CYKLOKAPRON) infusion ([facility])    . [START ON 09/23/2019] vancomycin     PRN Meds: sodium chloride, acetaminophen **OR** acetaminophen, HYDROcodone-acetaminophen, nitroGLYCERIN, ondansetron **OR** ondansetron (ZOFRAN) IV, sodium chloride flush, sodium chloride flush   Vital Signs    Vitals:   09/21/19 0314 09/21/19 1505 09/21/19 2008 09/22/19 0509  BP: (!) 144/96 (!) 147/94 128/80 125/78  Pulse: 89 87 88   Resp: 17  19 20   Temp: 98.1 F (36.7 C) 97.6 F (36.4 C) 98.3 F (36.8 C) 98.5 F (36.9 C)  TempSrc: Oral Oral Oral Oral  SpO2: 98% 100% 99% 98%  Weight: 133.1 kg   132.1 kg  Height:        Intake/Output Summary (Last 24 hours) at 09/22/2019 0658 Last data filed at 09/22/2019 0530 Gross per 24 hour  Intake 10 ml  Output 800 ml  Net -790 ml   Last 3 Weights 09/22/2019 09/21/2019 09/20/2019  Weight (lbs) 291 lb 3.6 oz 293 lb 6.9 oz 292 lb 1.8 oz  Weight (kg) 132.1 kg 133.1 kg 132.5 kg      Telemetry  Sinus with PVCs- Personally Reviewed  Physical Exam   GEN: No acute distress.  WD WN Neck: No JVD, supple Cardiac: RRR, 2/6 systolic murmur left sternal border. No DM Respiratory: Clear to auscultation bilaterally; no wheeze GI: Soft, nontender, non-distended, no masses  MS: No edema Back: Small fluctuant area at site of previous biopsy. Neuro:  Grossly intact Psych: Normal affect   Labs    High Sensitivity Troponin:   Recent Labs  Lab 09/08/19 0810 09/08/19 1100  TROPONINIHS 34* 28*      Chemistry Recent Labs  Lab 09/17/19 0400 09/17/19 0400 09/18/19 0642 09/18/19 0642 09/19/19 1653 09/20/19 0351 09/21/19 0308  NA 138   < > 137   < > 138 139 139  K 4.0   < > 3.8   < > 3.5 3.6 3.8  CL 108   < > 106   < > 107 111 110  CO2 20*   < > 24   < > 23 22 22   GLUCOSE 143*   < > 143*   < > 191* 139*  147*  BUN 10   < > 13   < > 12 11 11   CREATININE 1.23   < > 1.38*   < > 1.38* 1.28* 1.34*  CALCIUM 8.4*   < > 8.7*   < > 8.4* 8.2* 8.4*  PROT 7.5  --  7.7  --   --   --   --   ALBUMIN 2.6*  --  2.7*  --   --   --   --   AST 23  --  22  --   --   --   --   ALT 12  --  11  --   --   --   --   ALKPHOS 67  --  68  --   --   --   --   BILITOT 1.0  --  0.7  --   --   --   --   GFRNONAA >60   < > 58*   < > 58* >60 60*  GFRAA >60   < > >60   < > >60 >60 >60  ANIONGAP 10   < > 7   < > 8 6 7    < > = values in this interval not displayed.     Hematology Recent Labs  Lab 09/20/19 0351 09/21/19 0308 09/22/19 0541  WBC 7.7 7.5 6.9  RBC 2.98* 3.03* 3.06*  HGB 8.8* 8.6* 8.8*  HCT 24.8* 24.9* 25.7*  MCV 83.2 82.2 84.0  MCH 29.5 28.4 28.8  MCHC 35.5 34.5 34.2  RDW 15.0 15.0 15.9*  PLT 195 171 184     Patient Profile     54 y.o. 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 Additional issues:lytic lesion in his thoracic spine (T10), s/p biopsy on 04/28.Numerous dental problems, likely source of endocarditis, s/pdentalextractions 04/27.Gluteal area furuncle, spontaneously drained 4/23, currently packed. Steadily lengthening PR interval on ECG raises concern for periannular abscess formation.  Cardiac catheterization this admission shows occluded right coronary artery and 80% stenosis in the saphenous vein graft to the RCA.  Transesophageal echocardiogram showed normal LV function, severe right atrial enlargement, mild mitral regurgitation, large vegetation on the tricuspid valve with moderate tricuspid regurgitation, large vegetation on the prosthetic aortic valve without evidence of  periannular abscess, trace aortic insufficiency.  Assessment & Plan    1 endocarditis-patient  unchanged this morning.  Plan is for redo aortic valve replacement on May 3.  Continue antibiotics at present dose.  PR interval is prolonged raising concern for abscess.  Will need to follow closely postoperatively as patient may require pacemaker.  2 coronary artery disease-patient will have redo coronary artery bypass and graft to the right coronary artery at time of surgery.  Continue aspirin and statin.  3 paroxysmal atrial fibrillation-no recurrences.  Continue heparin at present dose.  4 T10 lytic lesion-biopsy results pending.  Chest CT shows small hematoma at biopsy site.  5 acute on chronic stage III kidney disease-BMET pending this AM. Follow renal function closely as he is receiving gentamicin.  For questions or updates, please contact [place] Please consult [email or web address] for contact info under        Signed, Kirk Ruths, MD  09/22/2019, 6:58 AM

## 2019-09-22 NOTE — Progress Notes (Addendum)
[place] for warfarin>>heparin Indication: atrial fibrillation + mechanical AVR  Patient Measurements: Height: 6\' 2"  (188 cm) Weight: 132.1 kg (291 lb 3.6 oz) IBW/kg (Calculated) : 82.2  Heparin weight: 131.5 kg  Vital Signs: Temp: 98.5 F (36.9 C) (05/02 0509) Temp Source: Oral (05/02 0509) BP: 125/78 (05/02 0509) Pulse Rate: 88 (05/01 2008)  Labs: Recent Labs    09/17/19 0400 09/17/19 0400 09/18/19 0642 09/19/19 0320 09/19/19 0745  HGB 9.2*   < > 9.4* 8.7*  --   HCT 26.5*  --  27.1* 25.6*  --   PLT 226  --  246 195  --   LABPROT 18.9*  --  20.2* 19.7*  --   INR 1.6*  --  1.8* 1.7*  --   HEPARINUNFRC  --   --  0.36 0.17* 0.37  CREATININE 1.23  --  1.38*  --   --    < > = values in this interval not displayed.   Estimated Creatinine Clearance: 88.5 mL/min (A) (by C-G formula based on SCr of 1.38 mg/dL (H)).  Assessment: 22 YOM on warfarin PTA for Afib and mechanical AVR (INR goal 2.5- 3.5). Vegetation seen on TTE this admission. Warfarin is held and bridged with IV heparin with impending need for procedures. He is s/p cath 4/26,  teeth extraction 4/27 and T10 lesion biopsy 4/28. Heparin restarted 4/29. Noted s/p biopsy on 4/29 and plan CABG and redo AVR on 5/3.  Heparin level came back therapeutic at 0.70, on 2050 units/hr. INR 1.4. Hgb 8.8, plt 184. No infusion issues - is having some bleeding at gums, will monitor closely.   Warfarin PTA dosing: 5mg  MWF, 7.5mg  all other days   Goal of Therapy:  Heparin level 0.3-0.7 units/mL (0.5 -0.7 if no bleeding) Monitor platelets by anticoagulation protocol: Yes   Plan:  Given upward trend and on higher end of goal, will reduce heparin infusion slightly to 2000 units/hr Heparin level daily wth CBC daily CABG + AVR planned 5/3  Antonietta Jewel, PharmD, [place] Pharmacist  Phone: [phone] 09/22/2019 7:43 AM  Please check AMION for all [place] phone numbers After 10:00 PM,  call [place] [phone]  ADDENDUM Patient continues to have bleeding at gums despite rate decrease. Was targeting goal range of 0.5-0.7, will adjust back to 0.3-0.7. Heparin level this afternoon after rate reduction to 2000 units/hr came back therapeutic at 0.55. No infusion issues. Will reduce to 1950 units/hr (had previously been therapeutic at rate ~0.3) and get level with AM labs.   Antonietta Jewel, PharmD, [place] Clinical Pharmacist

## 2019-09-23 ENCOUNTER — Other Ambulatory Visit (HOSPITAL_COMMUNITY): Payer: Medicare PPO

## 2019-09-23 ENCOUNTER — Inpatient Hospital Stay (HOSPITAL_COMMUNITY): Payer: Medicare PPO

## 2019-09-23 ENCOUNTER — Encounter (HOSPITAL_COMMUNITY)
Admission: EM | Disposition: A | Discharge status: Home Health | Payer: Self-pay | Source: Home / Self Care | Attending: Surgery

## 2019-09-23 ENCOUNTER — Inpatient Hospital Stay: Admission: RE | Admit: 2019-09-23 | Payer: Medicare PPO | Source: Home / Self Care | Admitting: Surgery

## 2019-09-23 ENCOUNTER — Encounter (HOSPITAL_COMMUNITY): Payer: Self-pay | Admitting: Internal Medicine

## 2019-09-23 DIAGNOSIS — I079 Rheumatic tricuspid valve disease, unspecified: Secondary | ICD-10-CM

## 2019-09-23 DIAGNOSIS — C9030 Solitary plasmacytoma not having achieved remission: Secondary | ICD-10-CM | POA: Diagnosis present

## 2019-09-23 DIAGNOSIS — R0989 Other specified symptoms and signs involving the circulatory and respiratory systems: Secondary | ICD-10-CM | POA: Diagnosis not present

## 2019-09-23 DIAGNOSIS — R7881 Bacteremia: Secondary | ICD-10-CM | POA: Diagnosis not present

## 2019-09-23 DIAGNOSIS — I480 Paroxysmal atrial fibrillation: Secondary | ICD-10-CM | POA: Diagnosis not present

## 2019-09-23 DIAGNOSIS — Z01818 Encounter for other preprocedural examination: Secondary | ICD-10-CM

## 2019-09-23 LAB — CBC
HCT: 26.7 % — ABNORMAL LOW (ref 39.0–52.0)
Hemoglobin: 9.3 g/dL — ABNORMAL LOW (ref 13.0–17.0)
MCH: 29.2 pg (ref 26.0–34.0)
MCHC: 34.8 g/dL (ref 30.0–36.0)
MCV: 83.7 fL (ref 80.0–100.0)
Platelets: 199 10*3/uL (ref 150–400)
RBC: 3.19 MIL/uL — ABNORMAL LOW (ref 4.22–5.81)
RDW: 16 % — ABNORMAL HIGH (ref 11.5–15.5)
WBC: 8.1 10*3/uL (ref 4.0–10.5)
nRBC: 0 % (ref 0.0–0.2)

## 2019-09-23 LAB — BASIC METABOLIC PANEL
Anion gap: 7 (ref 5–15)
BUN: 12 mg/dL (ref 6–20)
CO2: 24 mmol/L (ref 22–32)
Calcium: 9 mg/dL (ref 8.9–10.3)
Chloride: 108 mmol/L (ref 98–111)
Creatinine, Ser: 1.3 mg/dL — ABNORMAL HIGH (ref 0.61–1.24)
GFR calc Af Amer: >60 mL/min (ref >60)
GFR calc non Af Amer: >60 mL/min (ref >60)
Glucose, Bld: 142 mg/dL — ABNORMAL HIGH (ref 70–99)
Potassium: 3.6 mmol/L (ref 3.5–5.1)
Sodium: 139 mmol/L (ref 135–145)

## 2019-09-23 LAB — SURGICAL PATHOLOGY

## 2019-09-23 LAB — GLUCOSE, CAPILLARY
Glucose-Capillary: 137 mg/dL — ABNORMAL HIGH (ref 70–99)
Glucose-Capillary: 138 mg/dL — ABNORMAL HIGH (ref 70–99)
Glucose-Capillary: 127 mg/dL — ABNORMAL HIGH (ref 70–99)
Glucose-Capillary: 127 mg/dL — ABNORMAL HIGH (ref 70–99)
Glucose-Capillary: 177 mg/dL — ABNORMAL HIGH (ref 70–99)

## 2019-09-23 LAB — SURGICAL PCR SCREEN
MRSA, PCR: NEGATIVE
Staphylococcus aureus: NEGATIVE

## 2019-09-23 LAB — HEPARIN LEVEL (UNFRACTIONATED): Heparin Unfractionated: 0.67 IU/mL (ref 0.30–0.70)

## 2019-09-23 LAB — PREPARE RBC (CROSSMATCH)

## 2019-09-23 LAB — PROTIME-INR
INR: 1.3 — ABNORMAL HIGH (ref 0.8–1.2)
Prothrombin Time: 15.7 seconds — ABNORMAL HIGH (ref 11.4–15.2)

## 2019-09-23 SURGERY — CANCELLED PROCEDURE
Anesthesia: General

## 2019-09-23 MED ORDER — SUCCINYLCHOLINE CHLORIDE 200 MG/10ML IV SOSY
PREFILLED_SYRINGE | INTRAVENOUS | Status: AC
  Filled 2019-09-23: qty 10

## 2019-09-23 MED ORDER — PROPOFOL 10 MG/ML IV BOLUS
INTRAVENOUS | Status: AC
  Filled 2019-09-23: qty 40

## 2019-09-23 MED ORDER — FENTANYL CITRATE (PF) 250 MCG/5ML IJ SOLN
INTRAMUSCULAR | Status: AC
  Filled 2019-09-23: qty 25

## 2019-09-23 MED ORDER — LIDOCAINE 2% (20 MG/ML) 5 ML SYRINGE
INTRAMUSCULAR | Status: AC
  Filled 2019-09-23: qty 5

## 2019-09-23 MED ORDER — AMINOCAPROIC ACID SOLUTION 5% (50 MG/ML)
10.0000 mL | ORAL | Status: AC
  Administered 2019-09-23 (×7): 10 mL via ORAL
  Filled 2019-09-23: qty 100

## 2019-09-23 MED ORDER — BOOST / RESOURCE BREEZE PO LIQD CUSTOM
1.0000 | Freq: Two times a day (BID) | ORAL | Status: DC
  Administered 2019-09-24 – 2019-09-25 (×3): 1 via ORAL

## 2019-09-23 MED ORDER — ADULT MULTIVITAMIN W/MINERALS CH
1.0000 | ORAL_TABLET | Freq: Every day | ORAL | Status: DC
  Administered 2019-09-23 – 2019-09-29 (×5): 1 via ORAL
  Filled 2019-09-23 (×5): qty 1

## 2019-09-23 MED ORDER — MIDAZOLAM HCL (PF) 10 MG/2ML IJ SOLN
INTRAMUSCULAR | Status: AC
  Filled 2019-09-23: qty 2

## 2019-09-23 MED ORDER — ENSURE ENLIVE PO LIQD
237.0000 mL | Freq: Two times a day (BID) | ORAL | Status: DC
  Administered 2019-09-23 – 2019-10-03 (×12): 237 mL via ORAL

## 2019-09-23 MED ORDER — HEPARIN SODIUM (PORCINE) 1000 UNIT/ML IJ SOLN
INTRAMUSCULAR | Status: AC
  Filled 2019-09-23: qty 1

## 2019-09-23 MED ORDER — HYDROCORTISONE NA SUCCINATE PF 250 MG IJ SOLR
INTRAMUSCULAR | Status: AC
  Filled 2019-09-23: qty 250

## 2019-09-23 MED ORDER — HEPARIN (PORCINE) 25000 UT/250ML-% IV SOLN
1900.0000 [IU]/h | INTRAVENOUS | Status: DC
  Administered 2019-09-23 – 2019-09-24 (×3): 1900 [IU]/h via INTRAVENOUS
  Filled 2019-09-23 (×3): qty 250

## 2019-09-23 MED ORDER — ROCURONIUM BROMIDE 10 MG/ML (PF) SYRINGE
PREFILLED_SYRINGE | INTRAVENOUS | Status: AC
  Filled 2019-09-23: qty 10

## 2019-09-23 MED ORDER — PHENYLEPHRINE 40 MCG/ML (10ML) SYRINGE FOR IV PUSH (FOR BLOOD PRESSURE SUPPORT)
PREFILLED_SYRINGE | INTRAVENOUS | Status: AC
  Filled 2019-09-23: qty 10

## 2019-09-23 NOTE — Progress Notes (Signed)
Progress Note  Patient Name: [name] Date of Encounter: 09/23/2019  Primary Cardiologist: Kirk Ruths, MD  Subjective   Cancelled surgery redo AVR and CABG due to bleeding gum. Heparin on hold for 6-12 hours>> bleeding has stopped.  Heparin goal level is 0.3.  No chest pain or dyspnea.   Inpatient Medications    Scheduled Meds: . aminocaproic acid  10 mL Oral Q1H  . aspirin EC  81 mg Oral Daily  . atorvastatin  40 mg Oral Q breakfast  . colchicine  0.6 mg Oral BID  . feeding supplement  1 Container Oral TID BM  . feeding supplement (PRO-STAT SUGAR FREE 64)  30 mL Oral BID  . gabapentin  300 mg Oral Q breakfast  . insulin aspart  0-15 Units Subcutaneous TID WC  . insulin aspart  0-5 Units Subcutaneous QHS  . insulin aspart  6 Units Subcutaneous TID WC  . insulin detemir  6 Units Subcutaneous BID  . levothyroxine  50 mcg Oral Q0600  . polyethylene glycol  17 g Oral BID  . sodium chloride flush  10-40 mL Intracatheter Q12H  . sodium chloride flush  3 mL Intravenous Once  . sodium chloride flush  3 mL Intravenous Q12H   Continuous Infusions: . sodium chloride    . cefTRIAXone (ROCEPHIN)  IV 2 g (09/22/19 0855)  . gentamicin 220 mg (09/23/19 0502)  . heparin    . lactated ringers     PRN Meds: sodium chloride, acetaminophen **OR** acetaminophen, HYDROcodone-acetaminophen, nitroGLYCERIN, ondansetron **OR** ondansetron (ZOFRAN) IV, sodium chloride flush, sodium chloride flush   Vital Signs    Vitals:   09/22/19 0509 09/22/19 1433 09/22/19 2058 09/23/19 0504  BP: 125/78 132/84 (!) 160/97 (!) 147/99  Pulse:  80 90 86  Resp: 20  20 18   Temp: 98.5 F (36.9 C) 98.2 F (36.8 C) 99.2 F (37.3 C) 98.8 F (37.1 C)  TempSrc: Oral Oral Oral Oral  SpO2: 98% 98% 99% 99%  Weight: 132.1 kg   131 kg  Height:        Intake/Output Summary (Last 24 hours) at 09/23/2019 1006 Last data filed at 09/22/2019 2224 Gross per 24 hour  Intake 3 ml  Output 200 ml  Net -197 ml    Last 3 Weights 09/23/2019 09/22/2019 09/21/2019  Weight (lbs) 288 lb 11.2 oz 291 lb 3.6 oz 293 lb 6.9 oz  Weight (kg) 130.953 kg 132.1 kg 133.1 kg      Telemetry    Sinus rhythm with PACs - Personally Reviewed  ECG    N/A  Physical Exam   GEN: No acute distress.   Neck: No JVD Cardiac: RRR, 3/6 systolic murmurs, rubs, or gallops.  Respiratory: Clear to auscultation bilaterally. GI: Soft, nontender, non-distended  MS: No edema; No deformity. Neuro:  Nonfocal  Psych: Normal affect   Labs    High Sensitivity Troponin:   Recent Labs  Lab 09/08/19 0810 09/08/19 1100  TROPONINIHS 34* 28*      Chemistry Recent Labs  Lab 09/17/19 0400 09/17/19 0400 09/18/19 0642 09/19/19 1653 09/21/19 0308 09/22/19 0541 09/23/19 0458  NA 138   < > 137   < > 139 139 139  K 4.0   < > 3.8   < > 3.8 3.7 3.6  CL 108   < > 106   < > 110 109 108  CO2 20*   < > 24   < > 22 23 24   GLUCOSE 143*   < >  143*   < > 147* 136* 142*  BUN 10   < > 13   < > 11 12 12   CREATININE 1.23   < > 1.38*   < > 1.34* 1.38* 1.30*  CALCIUM 8.4*   < > 8.7*   < > 8.4* 8.7* 9.0  PROT 7.5  --  7.7  --   --   --   --   ALBUMIN 2.6*  --  2.7*  --   --   --   --   AST 23  --  22  --   --   --   --   ALT 12  --  11  --   --   --   --   ALKPHOS 67  --  68  --   --   --   --   BILITOT 1.0  --  0.7  --   --   --   --   GFRNONAA >60   < > 58*   < > 60* 58* >60  GFRAA >60   < > >60   < > >60 >60 >60  ANIONGAP 10   < > 7   < > 7 7 7    < > = values in this interval not displayed.     Hematology Recent Labs  Lab 09/21/19 0308 09/22/19 0541 09/23/19 0458  WBC 7.5 6.9 8.1  RBC 3.03* 3.06* 3.19*  HGB 8.6* 8.8* 9.3*  HCT 24.9* 25.7* 26.7*  MCV 82.2 84.0 83.7  MCH 28.4 28.8 29.2  MCHC 34.5 34.2 34.8  RDW 15.0 15.9* 16.0*  PLT 171 184 199    Radiology    CT CHEST W CONTRAST  Addendum Date: 09/21/2019   ADDENDUM REPORT: 09/21/2019 18:33 ADDENDUM: In the soft tissues posterior to the T10 vertebral body there is a  small 3.4 x 1.5 cm collection in the subcutaneous fat. This is consistent with a small post biopsy hematoma. There is no evidence for active extravasation. Electronically Signed   By: Constance Holster M.D.   On: 09/21/2019 18:33   Result Date: 09/21/2019 CLINICAL DATA:  Chest pain. Shortness of breath. Concern for pleural effusion. EXAM: CT CHEST WITH CONTRAST TECHNIQUE: Multidetector CT imaging of the chest was performed during intravenous contrast administration. CONTRAST:  21mL OMNIPAQUE IOHEXOL 300 MG/ML  SOLN COMPARISON:  09/10/2019 FINDINGS: Cardiovascular: The patient is again status Bentall procedure. A chronic dissection is again noted involving the aortic arch. The heart size is stable but enlarged. The main pulmonary artery is dilated but stable from prior study. Aortic calcifications are noted. Atherosclerotic changes are noted of the thoracic aorta. There is no pericardial effusion. Mediastinum/Nodes: --No mediastinal or hilar lymphadenopathy. --No axillary lymphadenopathy. --No supraclavicular lymphadenopathy. --Normal thyroid gland. --The esophagus is unremarkable Lungs/Pleura: No pulmonary nodules or masses. No pleural effusion or pneumothorax. No focal airspace consolidation. No focal pleural abnormality. Upper Abdomen: There is cholelithiasis. Musculoskeletal: Again noted is a lytic lesion involving the T10 vertebral body. This lesion extends into the posterior tenth rib on the right. Subtle additional lytic lesions are noted in various osseous structures including the anterior fifth rib on the left. Review of the MIP images confirms the above findings. IMPRESSION: 1. No acute abnormality. 2. Again noted is an aggressive appearing lytic lesion in the T10 vertebral body. Additional smaller subtle lytic lesions are noted involving the ribs, especially the anterior left fifth rib. 3. Cholelithiasis. Aortic Atherosclerosis (ICD10-I70.0). Electronically Signed: By: Jamie Kato.D.  On:  09/21/2019 17:10    Cardiac Studies   RIGHT HEART CATH AND CORONARY/GRAFT ANGIOGRAPHY  09/09/19  Conclusion    Ost RCA to Prox RCA lesion is 100% stenosed.  SVG to RVA with Origin lesion is 80% stenosed in stent.  Hemodynamic findings consistent with mild pulmonary hypertension.   1. Single vessel occlusive CAD with CTO of the ostium of the RCA 2. SVG to RCA with 80% in stent stenosis at the ostium. The RCA is a large vessel. 3. Mildly elevated LV filling pressures 4. Mild pulmonary HTN 5. Normal cardiac output.   Plan: Redo AVR and CABG to RCA.    Diagnostic Dominance: Right    TTE 09/09/19 1. Tricuspid valve vegetation. Discussed with referring provider.  2. Left ventricular ejection fraction, by estimation, is 55 to 60%. The  left ventricle has normal function. The left ventricle has no regional  wall motion abnormalities. Left ventricular diastolic parameters were  normal.  3. Right ventricular systolic function is normal. The right ventricular  size is mildly enlarged. There is normal pulmonary artery systolic  pressure.  4. Left atrial size was moderately dilated.  5. Right atrial size was severely dilated.  6. The mitral valve is normal in structure. Mild mitral valve  regurgitation. No evidence of mitral stenosis.  7. There is a 1.8 x 1.1 cm tricuspid valve mass on the atrial surface  consistent with vegetation. . Tricuspid valve regurgitation is moderate.  8. Transaortic velocity is higher than expected for mechanical aortic  valve. The aortic valve is normal in structure. Aortic valve regurgitation  is not visualized. No aortic stenosis is present. There is a mechanical  valve present in the aortic  position. Procedure Date: 2000. Aortic valve mean gradient measures 33.5  mmHg. Aortic valve Vmax measures 3.68 m/s.  9. Aortic root/ascending aorta has been repaired/replaced and Bentall.  10. The inferior vena cava is normal in size with greater than  50%  respiratory variability, suggesting right atrial pressure of 3 mmHg.    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 Additional issues:lytic lesion in his thoracic spine (T10), s/p biopsy on 04/28.Numerous dental problems, likely source of endocarditis, s/pdentalextractions 04/27.Gluteal area furuncle, spontaneously drained 4/23, currently packed. Steadily lengthening PR interval on ECG raises concern for periannular abscess formation.  Cardiac catheterization this admission shows occluded right coronary artery and 80% stenosis in the saphenous vein graft to the RCA.  Transesophageal echocardiogram showed normal LV function, severe right atrial enlargement, mild mitral regurgitation, large vegetation on the tricuspid valve with moderate tricuspid regurgitation, large vegetation on the prosthetic aortic valve without evidence of periannular abscess, trace aortic insufficiency.  Assessment & Plan    1. Endocarditis  - Cancelled surgery due to bleeding gum. Heparin stopped. On abx.   2. CAD  - Cath this admission as above.  - Plan for redo CABG with graft to RCA - No chest pain - On ASA and statin  3. PAF - Maintaining sinus rhythm. Heparin as above  3. CKD III - SCr stable at 1.3  For questions or updates, please contact [place] Please consult [email or web address] for contact info under        SignedLeanor Kail, PA  09/23/2019, 10:06 AM

## 2019-09-23 NOTE — Progress Notes (Signed)
Biopsy consistent with plasma cell neoplasm.  Patient has obvious solitary lesion at T10 but also has some likely lesions within his thoracic cage.  Patient meets definition of multiple myeloma.  Patient needs radiation oncology and medical oncology evaluation.  No role for surgery at this point.  Continue light activity level.

## 2019-09-23 NOTE — Progress Notes (Signed)
PROGRESS NOTE    [name]  [id number] DOB: [birth year] DOA: 09/08/2019 PCP: Biagio Borg, MD   Brief Narrative:  54 y.o.malewith history of CAD s/p CABG in 2000, SVG-RCA PCI with BMS in Feb 2015, he had re-do PCI in June 2019 DES to SVG graft, ascending aortic dissection in 2000 s/p repair with AVR on warfarin. Paroxysmal A. fib. NIDDM-2, HTN and HLD presenting with 5 days of chills, nausea, vomiting, diarrhea and dyspnea on exertion and orthopnea mainly with lying on the left side. He's been found to have infective endocarditis with strep infantarius bacteremia.  He had a TEE showing large vegetations on the septal leaflet of the tricuspid valve and on the posterior disc of the aortic valve.  He's on ceftriaxone/gentamicin for his endocarditis at this point.  ID and cardiology are following.  He was seen by dental medicine 4/27 for multiple tooth extraction, alveoloplasty, debridement.  He's also been seen by surgery for a left buttocks abscess which has spontaneously drained and by orthopedics for L MTP pain and swelling which is thought to be gout.  He had an incidental aggressive appearing lesion in T10 which will be biopsied on 4/28 by IR, neurosurgery is also following for this lesion.  Dr. Cyndia Bent from [place] surgery planning for surgery on Monday 5/3 but postponed due to bleeding,   Assessment & Plan:   Principal Problem:   Suspected endocarditis mechanical aortic valve Active Problems:   Diabetes mellitus type II, non insulin dependent ([place])   Hyperlipidemia   Essential hypertension   PAF (paroxysmal atrial fibrillation) (H[facility])   Hypothyroidism   Normocytic anemia   Shortness of breath   Obesity (BMI 30-39.9)   CAD (coronary artery disease)   Nausea vomiting and diarrhea   Gram-positive cocci bacteremia   Endocarditis of tricuspid valve   Infective endocarditis -tricuspid vegetation with regurgitation strep Infantarius Bacteremia:  -Repeat blood cultures negative,  currently on IV Rocephin and gentamicin per infectious disease.  TEE showed large vegetation.  Seen by dentist status post tooth removal, will see the patient again today due to bleeding gum.  CT surgery postponed for now due to bleeding gums. - cardiac catheterization 4/26 -> with single vessel occlusive CAD with CTO of the ostium of the RCA, SVG to RCA with 80% in stent stenosis at the ostium, mildly elevated LV filling pressures, mild pulm HTN -> plan for redo AVR and CABG to RCA -IR-T10 spinal lesion biopsy performed 4/28-results pending  Back pain fluctuant mass around mid back, hematoma -CT chest performed, no evidence of active bleeding.  Small hematoma secondary to mild trauma during biopsy while being on heparin drip.  Monitor for now  Bleeding gums with poor dentition status post tooth extraction -Dentist to see the patient again today -Hold Heparin for 12 hours.  Resume with target goal 0.3.  Left Buttocks Abscess: Spontaneous drainage.  Seen by general surgery.  Flagyl discontinued 4/29.  Routine dressing per surgery and wound care  Left MTP Pain, concerning for gout continue colchicine.  Seen by orthopedic no further recommendations.  Aggressive appearing lesion in T10 vertebral body: Status post biopsy 4/28-pending results Seen by neurosurgery  Iron Def Anemia:  Iron supplements with bowel regimen B12, folate-normal  CAD s/p CABG in 2000, SVG-RCA PCI with BMS in Feb 2015, he had re-do PCI in June 2019 DES to SVG graft: Echocardiogram as above. -Continue homeASA andstatin.  -Plavix on hold, last dose 4/19 -Metoprolol, losartan and Lasix on hold due to AKI and  soft blood pressures  Ascending aortic dissection in 2000 s/p repair with Mechanical AVR on warfarin.  -On Heparin -Monitor fluid status and renal function.  Paroxysmal A. fib.  -Continue heparin drip.  Uncontrolled NIDDM-2 with hyperglycemia: A1c 8.2%. -ContinueSSI-moderate -Levemir 6 units twice  daily, NovoLog 6 units 3 times daily -Continue statin.   CKD stage IIIa (vancomycin, Lasix and losartan) -Baseline creatinine 1.2-1.3. -Holding metoprolol, Lasix and losartan -Continue monitoring  Essential HTN: -metoprolol, amlodipine and losartanon hold.  BP's reasonable  HLD  -Continue atorvastatin.  Hypothyroidism: -Continue home Synthroid.   Morbid obesity:Body mass index is 36.03 kg/m.With comorbidities as above. -Encourage lifestyle change to lose weight.  Would benefit from outpatient sleep study   DVT prophylaxis: Heparin drip, currently paused Code Status: Full code Family Communication: None at bedside Disposition Plan:   Patient From= home  Patient Anticipated D/C place= to be determined  Barriers= plan CT surgery but currently postponed due to, bleeding, maintain hospital stay  Subjective: Patient's CT surgery canceled this morning due to gum bleeding with gauze in place.  Heparin drip was held this morning therefore by the time I evaluated the patient that bleeding had subsided.  Review of Systems Otherwise negative except as per HPI, including: General: Denies fever, chills, night sweats or unintended weight loss. Resp: Denies cough, wheezing, shortness of breath. Cardiac: Denies chest pain, palpitations, orthopnea, paroxysmal nocturnal dyspnea. GI: Denies abdominal pain, nausea, vomiting, diarrhea or constipation GU: Denies dysuria, frequency, hesitancy or incontinence MS: Denies muscle aches, joint pain or swelling Neuro: Denies headache, neurologic deficits (focal weakness, numbness, tingling), abnormal gait Psych: Denies anxiety, depression, SI/HI/AVH Skin: Denies new rashes or lesions ID: Denies sick contacts, exotic exposures, travel  Examination: Constitutional: Not in acute distress Respiratory: Clear to auscultation bilaterally Cardiovascular: Normal sinus rhythm, no rubs Abdomen: Nontender nondistended good bowel  sounds Musculoskeletal: Small area of hard mass on the mid back-small hematoma from biopsy Skin: No rashes seen Neurologic: CN 2-12 grossly intact.  And nonfocal Psychiatric: Normal judgment and insight. Alert and oriented x 3. Normal mood.  Objective: Vitals:   09/22/19 0509 09/22/19 1433 09/22/19 2058 09/23/19 0504  BP: 125/78 132/84 (!) 160/97 (!) 147/99  Pulse:  80 90 86  Resp: 20  20 18   Temp: 98.5 F (36.9 C) 98.2 F (36.8 C) 99.2 F (37.3 C) 98.8 F (37.1 C)  TempSrc: Oral Oral Oral Oral  SpO2: 98% 98% 99% 99%  Weight: 132.1 kg   131 kg  Height:        Intake/Output Summary (Last 24 hours) at 09/23/2019 0836 Last data filed at 09/22/2019 2224 Gross per 24 hour  Intake 3 ml  Output 200 ml  Net -197 ml   Filed Weights   09/21/19 0314 09/22/19 0509 09/23/19 0504  Weight: 133.1 kg 132.1 kg 131 kg     Data Reviewed:   CBC: Recent Labs  Lab 09/17/19 0400 09/18/19 0642 09/19/19 0320 09/20/19 0351 09/21/19 0308 09/22/19 0541 09/23/19 0458  WBC 10.2   < > 7.1 7.7 7.5 6.9 8.1  NEUTROABS 7.8*  --   --   --   --   --   --   HGB 9.2*   < > 8.7* 8.8* 8.6* 8.8* 9.3*  HCT 26.5*   < > 25.6* 24.8* 24.9* 25.7* 26.7*  MCV 81.5   < > 83.1 83.2 82.2 84.0 83.7  PLT 226   < > 195 195 171 184 199   < > = values in this interval  not displayed.   Basic Metabolic Panel: Recent Labs  Lab 09/17/19 0400 09/17/19 0400 09/18/19 0642 09/18/19 [id number] 09/19/19 1653 09/20/19 0351 09/21/19 0308 09/22/19 0541 09/23/19 0458  NA 138   < > 137   < > 138 139 139 139 139  K 4.0   < > 3.8   < > 3.5 3.6 3.8 3.7 3.6  CL 108   < > 106   < > 107 111 110 109 108  CO2 20*   < > 24   < > 23 22 22 23 24   GLUCOSE 143*   < > 143*   < > 191* 139* 147* 136* 142*  BUN 10   < > 13   < > 12 11 11 12 12   CREATININE 1.23   < > 1.38*   < > 1.38* 1.28* 1.34* 1.38* 1.30*  CALCIUM 8.4*   < > 8.7*   < > 8.4* 8.2* 8.4* 8.7* 9.0  MG 1.5*  --  2.0  --   --   --   --   --   --   PHOS 3.2  --  3.5  --   --    --   --   --   --    < > = values in this interval not displayed.   GFR: Estimated Creatinine Clearance: 93.4 mL/min (A) (by C-G formula based on SCr of 1.3 mg/dL (H)). Liver Function Tests: Recent Labs  Lab 09/17/19 0400 09/18/19 0642  AST 23 22  ALT 12 11  ALKPHOS 67 68  BILITOT 1.0 0.7  PROT 7.5 7.7  ALBUMIN 2.6* 2.7*   No results for input(s): LIPASE, AMYLASE in the last 168 hours. No results for input(s): AMMONIA in the last 168 hours. Coagulation Profile: Recent Labs  Lab 09/19/19 0320 09/20/19 0351 09/21/19 0308 09/22/19 0541 09/23/19 0458  INR 1.7* 1.7* 1.6* 1.4* 1.3*   Cardiac Enzymes: No results for input(s): CKTOTAL, CKMB, CKMBINDEX, TROPONINI in the last 168 hours. BNP (last 3 results) No results for input(s): PROBNP in the last 8760 hours. HbA1C: No results for input(s): HGBA1C in the last 72 hours. CBG: Recent Labs  Lab 09/22/19 1130 09/22/19 1640 09/22/19 2056 09/23/19 0501 09/23/19 0738  GLUCAP 169* 168* 120* 137* 138*   Lipid Profile: No results for input(s): CHOL, HDL, LDLCALC, TRIG, CHOLHDL, LDLDIRECT in the last 72 hours. Thyroid Function Tests: No results for input(s): TSH, T4TOTAL, FREET4, T3FREE, THYROIDAB in the last 72 hours. Anemia Panel: No results for input(s): VITAMINB12, FOLATE, FERRITIN, TIBC, IRON, RETICCTPCT in the last 72 hours. Sepsis Labs: No results for input(s): PROCALCITON, LATICACIDVEN in the last 168 hours.  Recent Results (from the past 240 hour(s))  Surgical PCR screen     Status: None   Collection Time: 09/16/19  3:34 PM   Specimen: Nasal Mucosa; Nasal Swab  Result Value Ref Range Status   MRSA, PCR NEGATIVE NEGATIVE Final   Staphylococcus aureus NEGATIVE NEGATIVE Final    Comment: (NOTE) The Xpert SA Assay (FDA approved for NASAL specimens in patients 36 years of age and older), is one component of a comprehensive surveillance program. It is not intended to diagnose infection nor to guide or monitor  treatment. Performed at [facility], [place] [address] [place], [place] [address]   Surgical pcr screen     Status: None   Collection Time: 09/23/19  4:39 AM   Specimen: Nasal Mucosa; Nasal Swab  Result Value Ref Range Status  MRSA, PCR NEGATIVE NEGATIVE Final   Staphylococcus aureus NEGATIVE NEGATIVE Final    Comment: (NOTE) The Xpert SA Assay (FDA approved for NASAL specimens in patients 25 years of age and older), is one component of a comprehensive surveillance program. It is not intended to diagnose infection nor to guide or monitor treatment. Performed at [facility], [place] [address] [place], [place] [address]          Radiology Studies: CT CHEST W CONTRAST  Addendum Date: 09/21/2019   ADDENDUM REPORT: 09/21/2019 18:33 ADDENDUM: In the soft tissues posterior to the T10 vertebral body there is a small 3.4 x 1.5 cm collection in the subcutaneous fat. This is consistent with a small post biopsy hematoma. There is no evidence for active extravasation. Electronically Signed   By: Constance Holster M.D.   On: 09/21/2019 18:33   Result Date: 09/21/2019 CLINICAL DATA:  Chest pain. Shortness of breath. Concern for pleural effusion. EXAM: CT CHEST WITH CONTRAST TECHNIQUE: Multidetector CT imaging of the chest was performed during intravenous contrast administration. CONTRAST:  48mL OMNIPAQUE IOHEXOL 300 MG/ML  SOLN COMPARISON:  09/10/2019 FINDINGS: Cardiovascular: The patient is again status Bentall procedure. A chronic dissection is again noted involving the aortic arch. The heart size is stable but enlarged. The main pulmonary artery is dilated but stable from prior study. Aortic calcifications are noted. Atherosclerotic changes are noted of the thoracic aorta. There is no pericardial effusion. Mediastinum/Nodes: --No mediastinal or hilar lymphadenopathy. --No axillary lymphadenopathy. --No supraclavicular lymphadenopathy. --Normal thyroid gland. --The esophagus is unremarkable  Lungs/Pleura: No pulmonary nodules or masses. No pleural effusion or pneumothorax. No focal airspace consolidation. No focal pleural abnormality. Upper Abdomen: There is cholelithiasis. Musculoskeletal: Again noted is a lytic lesion involving the T10 vertebral body. This lesion extends into the posterior tenth rib on the right. Subtle additional lytic lesions are noted in various osseous structures including the anterior fifth rib on the left. Review of the MIP images confirms the above findings. IMPRESSION: 1. No acute abnormality. 2. Again noted is an aggressive appearing lytic lesion in the T10 vertebral body. Additional smaller subtle lytic lesions are noted involving the ribs, especially the anterior left fifth rib. 3. Cholelithiasis. Aortic Atherosclerosis (ICD10-I70.0). Electronically Signed: By: Constance Holster M.D. On: 09/21/2019 17:10        Scheduled Meds: . aminocaproic acid  10 mL Oral Q1H  . aspirin EC  81 mg Oral Daily  . atorvastatin  40 mg Oral Q breakfast  . colchicine  0.6 mg Oral BID  . feeding supplement  1 Container Oral TID BM  . feeding supplement (PRO-STAT SUGAR FREE 64)  30 mL Oral BID  . gabapentin  300 mg Oral Q breakfast  . insulin aspart  0-15 Units Subcutaneous TID WC  . insulin aspart  0-5 Units Subcutaneous QHS  . insulin aspart  6 Units Subcutaneous TID WC  . insulin detemir  6 Units Subcutaneous BID  . levothyroxine  50 mcg Oral Q0600  . polyethylene glycol  17 g Oral BID  . sodium chloride flush  10-40 mL Intracatheter Q12H  . sodium chloride flush  3 mL Intravenous Once  . sodium chloride flush  3 mL Intravenous Q12H   Continuous Infusions: . sodium chloride    . cefTRIAXone (ROCEPHIN)  IV 2 g (09/22/19 0855)  . gentamicin 220 mg (09/23/19 0502)  . heparin    . lactated ringers       LOS: 15 days   Time spent=  35 mins    Giankarlo Leamer Arsenio Loader, MD [facility]  If 7PM-7AM, please contact night-coverage  09/23/2019, 8:36 AM

## 2019-09-23 NOTE — Progress Notes (Addendum)
Patient ID: Jaquise Canario, male   DOB: 27-May-1965, 54 y.o.   MRN: [id number] TCTS:  Patient sent to the OR this am with mouth packed due to bleeding. He says it started early yesterday am when he woke up and bled all day yesterday and all night. His wife says she told every nurse that came in the room but our service was never notified. His mouth is full of blood saturated gauze at this time and it is still bleeding a lot so I have cancelled his surgery for today. We can't do this surgery until bleeding is stopped. His heparin levels have been on the high side this weekend at 0.5-0.7. He only needs to be 0.3 and I think the heparin could be stopped for 6-12 hrs to get the bleeding to stop before resuming it. I discussed the bleeding with Dr. Enrique Sack who will see him. I will reschedule surgery for later this week after bleeding stops, probably Thursday. I discussed with his wife who is at home.

## 2019-09-23 NOTE — Progress Notes (Signed)
Patient ID: [name], male   DOB: [birth year], 54 y.o.   MRN: [id number]          [facility] for Infectious Disease    Date of Admission:  09/08/2019   Day 15 ceftriaxone        Day 13 gentamicin  Mr. 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  He may have an early perivalvular abscess around his aortic valve.  He defervesced promptly on antibiotic therapy and repeat blood cultures were negative. He will complete 2 weeks of gentamicin tomorrow and continue ceftriaxone 1 at least for 1 more weeks.  He was scheduled for valve replacement surgery but it had to be postponed this morning because of significant abdominal bleeding.  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  We will follow-up later this week.        Michel Bickers, MD [facility] for Infectious [place] [facility] [phone] pager   [phone] cell 09/23/2019, 4:06 PM

## 2019-09-23 NOTE — Progress Notes (Addendum)
[place] for warfarin>>heparin Indication: atrial fibrillation + mechanical AVR  Patient Measurements: Height: 6\' 2"  (188 cm) Weight: 131 kg (288 lb 11.2 oz) IBW/kg (Calculated) : 82.2  Heparin weight: 131.5 kg  Vital Signs: Temp: 98.8 F (37.1 C) (05/03 0504) Temp Source: Oral (05/03 0504) BP: 147/99 (05/03 0504) Pulse Rate: 86 (05/03 0504)  Labs: Recent Labs    09/17/19 0400 09/17/19 0400 09/18/19 0642 09/19/19 0320 09/19/19 0745  HGB 9.2*   < > 9.4* 8.7*  --   HCT 26.5*  --  27.1* 25.6*  --   PLT 226  --  246 195  --   LABPROT 18.9*  --  20.2* 19.7*  --   INR 1.6*  --  1.8* 1.7*  --   HEPARINUNFRC  --   --  0.36 0.17* 0.37  CREATININE 1.23  --  1.38*  --   --    < > = values in this interval not displayed.   Estimated Creatinine Clearance: 93.4 mL/min (A) (by C-G formula based on SCr of 1.3 mg/dL (H)).  Assessment: 29 YOM on warfarin PTA for Afib and mechanical AVR (INR goal 2.5- 3.5). Vegetation seen on TTE this admission. Warfarin is held and bridged with IV heparin with impending need for procedures. He is s/p cath 4/26,  teeth extraction 4/27 and T10 lesion biopsy 4/28. Heparin restarted 4/29. Noted s/p biopsy on 4/29 and plan CABG and redo AVR on 5/3.  Heparin level came back on higher end of therapeutic range at 0.67 this morning despite 2 dose reductions yesterday, now on 1950 units/hr (which pt had been therapeutic previously around ~0.3). INR 1.3. Hgb 9.3, plt 199. No infusion issues - continues to have bleeding at gums, will monitor closely.   Warfarin PTA dosing: 5mg  MWF, 7.5mg  all other days   Goal of Therapy:  Heparin level 0.3-0.4 units/mL Monitor platelets by anticoagulation protocol: Yes   Plan:  CABG postponed given continued bleeding at gums Hold heparin for 12 hours then restart heparin infusion at 1900 units/hr on 5/3@2000  Get level in 6 hours from restart  Monitor daily HL, CBC, and for s/sx of  bleeding F/u plans for CABG  Antonietta Jewel, PharmD, BCCCP Clinical Pharmacist  Phone: [phone] 09/23/2019 8:15 AM  Please check AMION for all [place] phone numbers After 10:00 PM, call [place] [phone]

## 2019-09-23 NOTE — Progress Notes (Signed)
Nutrition Follow-up  DOCUMENTATION CODES:   Obesity unspecified  INTERVENTION:   Continue Boost Breeze po BID, each supplement provides 250 kcal and 9 grams of protein.  Continue Pro-stat 30 ml PO BID, each supplement provides 100 kcal and 15 gm protein.  Add Ensure Enlive po BID, each supplement provides 350 kcal and 20 grams of protein.  Add MVI with minerals daily.  NUTRITION DIAGNOSIS:   Increased nutrient needs related to acute illness(infectious endocarditis, buttocks abscess, upcoming surgery planned) as evidenced by estimated needs.  Ongoing  GOAL:   Patient will meet greater than or equal to 90% of their needs   Progressing  MONITOR:   PO intake, Supplement acceptance, Diet advancement, Labs  ASSESSMENT:   54 yo male admitted with N/V/D, DOE. Found to have infectious endocarditis. Also found to have a buttocks abscess and vertebral lesion. PMH includes HTN, HLD, PAF, TIA, DM, CAD s/p CABG 2000.   Cardiac surgery was cancelled this AM due to bleeding gums. Patient reports poor intake. Meal completion documented at 100% x 2 meals 4/25, 100% x 1 meal 4/27, and 100% x 1 meal 4/29. He reports ordering small meals, like vegetable soup and a fruit cup for lunch yesterday. Patient says that at home he usually eats breakfast and lunch, but not much for dinner. He tries not to eat after 5-6 pm. Explained the importance of adequate nutrition to support healing, especially after cardiac surgery. Patient agreed to focus on eating 3 meals per day with supplements in between meals. He likes [employer] and has also agreed to try [employer] for additional calories and protein.   Labs reviewed.  CBG's: [phone]  Medications reviewed and include novolog, levemir, miralax, IV rocephin, IV gentamicin.  Diet Order:   Diet Order    None      EDUCATION NEEDS:   Not appropriate for education at this time  Skin:  Skin Assessment: Skin Integrity  Issues: Skin Integrity Issues:: Other (Comment) Other: abscess to L buttocks  Last BM:  5/2  Height:   Ht Readings from Last 1 Encounters:  09/08/19 6\' 2"  (1.88 m)    Weight:   Wt Readings from Last 1 Encounters:  09/23/19 131 kg    Ideal Body Weight:  86.4 kg  BMI:  Body mass index is 37.07 kg/m.  Estimated Nutritional Needs:   Kcal:  2400-2600  Protein:  130-150 gm  Fluid:  >/= 2.4 L    Molli Barrows, RD, LDN, CNSC Please refer to Amion for contact information.

## 2019-09-23 NOTE — Progress Notes (Addendum)
PROGRESS NOTE:  POST OP DAY 6  09/23/2019 [name] [id number]   VITALS: BP (!) 134/93 (BP Location: Left Arm)   Pulse 87   Temp 98.2 F (36.8 C) (Oral)   Resp 20   Ht 6\' 2"  (1.88 m)   Wt 131 kg   SpO2 100%   BMI 37.07 kg/m   LABS:  Lab Results  Component Value Date   WBC 8.1 09/23/2019   HGB 9.3 (L) 09/23/2019   HCT 26.7 (L) 09/23/2019   MCV 83.7 09/23/2019   PLT 199 09/23/2019   BMET    Component Value Date/Time   NA 139 09/23/2019 0458   K 3.6 09/23/2019 0458   CL 108 09/23/2019 0458   CO2 24 09/23/2019 0458   GLUCOSE 142 (H) 09/23/2019 0458   BUN 12 09/23/2019 0458   CREATININE 1.30 (H) 09/23/2019 0458   CREATININE 1.14 06/15/2016 0946   CALCIUM 9.0 09/23/2019 0458   GFRNONAA >60 09/23/2019 0458   GFRAA >60 09/23/2019 0458    Lab Results  Component Value Date   INR 1.3 (H) 09/23/2019   INR 1.4 (H) 09/22/2019   INR 1.6 (H) 09/21/2019   PROTIME 15.9 11/06/2008   PROTIME 15.6 10/23/2008   Heparin level (unfractionated) Order: [id number] Status:  Final result Visible to patient:  Yes (MyChart) Next appt:  09/27/2019 at 09:30 AM in Cardiology ([name])  Ref Range & Units 04:58  (09/23/19) 1 d ago  (09/22/19) 1 d ago  (09/22/19) 2 d ago  (09/21/19)  Heparin Unfractionated 0.30 - 0.70 IU/mL 0.67  0.55 CM  0.70 CM  0.55 CM   Comment: (NOTE)  If heparin results are below expected values, and patient dosage has  been confirmed, suggest follow up testing of antithrombin III levels.  Performed at [facility], [place] [address], [place], [place]  [address]   Resulting Agency  [facility] CLIN LAB [facility] CL[facility] LAB [facility] LAB [facility] LAB      Specimen Collected: 09/23/19 04:58 Last Resulted: 09/23/19 06:02         [name] is status post multiple extractions with alveoloplasty and gross debridement of remaining dentition in the operating with general anesthesia on 09/17/2019.  SUBJECTIVE: Patient currently denies having any active  bleeding.  Patient has been using the Amicar rinses with good effect.  Patient indicates that he did have previous bleeding from the upper right quadrant this morning and yesterday.  Patient indicates that he is frustrated and having to have the surgery canceled.  EXAM: There is no sign of infection, heme, or ooze.  Sutures are intact.  Clots are present.  Patient is sutured up by generalized primary closure.  ASSESSMENT: Post operative course is consistent with dental procedures performed in the operating room with general anesthesia on 09/17/2019 Loss of teeth due to extraction History of postoperative bleeding most likely consistent with a combination of heparin therapy, aspirin therapy, and low platelet levels.   PLAN: 1.  Restart heparin therapy as planned with a lower rate of flow for heparin therapy. Keep therapeutic heparin level at 0.3-0.4 as best able. 2.  Continue Amicar rinses for the 10 doses and then use as needed for persistent oozing.  Patient instructed to place some of the Amicar rinse on a series of three 4 x 4 gauzes.  Patient is to then place the gauze with Amicar rinse impregnated on it in the extraction site that is bleeding and apply pressure with biting pressure. 3.  Advance diet as tolerated keeping a soft mechanical diet at best. 4.  Avoid straws. 5.  Monitor CBC and consider transfusion as indicated. 6.  Patient should be ready for surgery on Thursday if no significant bleeding occurs.   Lenn Cal, DDS

## 2019-09-23 NOTE — Anesthesia Preprocedure Evaluation (Addendum)
Anesthesia Evaluation    Reviewed: Allergy & Precautions, Patient's Chart, lab work & pertinent test results, reviewed documented beta blocker date and time   Airway        Dental   Pulmonary           Cardiovascular hypertension, Pt. on medications and Pt. on home beta blockers + CAD, + Cardiac Stents, + CABG and +CHF    - s/p AVR   Neuro/Psych PSYCHIATRIC DISORDERS Anxiety Depression CVA    GI/Hepatic negative GI ROS, Neg liver ROS,   Endo/Other  diabetes, Type 2, Oral Hypoglycemic AgentsHypothyroidism   Renal/GU Renal InsufficiencyRenal disease     Musculoskeletal   Abdominal   Peds  Hematology   Anesthesia Other Findings   Reproductive/Obstetrics                             Echo:   1. Left ventricular ejection fraction, by estimation, is 55 to 60%. The  left ventricle has normal function. The left ventricle has no regional  wall motion abnormalities. There is mild concentric left ventricular  hypertrophy.  2. Right ventricular systolic function is normal. The right ventricular  size is mildly enlarged. There is mildly elevated pulmonary artery  systolic pressure. The estimated right ventricular systolic pressure is  [id number] mmHg.  3. It appears that the left atrial appendage has been surgically resected  or oversown. Left atrial size was severely dilated. No left atrial/left  atrial appendage thrombus was detected. The LAA emptying velocity was 35  cm/s.  4. Right atrial size was severely dilated.  5. The mitral valve is normal in structure. Mild mitral valve  regurgitation.  6. There is a large, moderately mobile vegetation attached to the septal  leaflet of the tricuspid valve, measuring 27 mm in length and 11 mm in  width. It appears heterogeneous and friable.. Tricuspid valve  regurgitation is moderate.  7. There is a large vegetation attached to the medial aspect of  posterior  disc or the posteromedial mechanical valve annulus. It is highly mobile  and prolapses readily across the valve orifice. It measures 15 mm in  length and 6 mm in width. Although  there is no evidence of a periannular abscess cavity, the periannular  tissue echodensity is heterogeneous, raining concern for  inflammation/early abscess formation. The proximity of the aortic and the  tricuspid vegetation to the same area of the  interventricular septum also raises concern for intramyocardial abscess or  fistula formation. The aortic valve has been repaired/replaced. Aortic  valve regurgitation is trivial. Mild aortic valve stenosis. There is a  unknown Medtronic tilting disk valve  present in the aortic position. Procedure Date: 2000. Aortic valve mean  gradient measures 20.0 mmHg.  8. There is mild (Grade II) atheroma plaque involving the transverse  aorta.   Anesthesia Physical Anesthesia Plan  ASA: IV  Anesthesia Plan: General   Post-op Pain Management:    Induction: Intravenous  PONV Risk Score and Plan: 1 and Ondansetron  Airway Management Planned: Oral ETT  Additional Equipment: Arterial line, CVP, PA Cath, TEE and Ultrasound Guidance Line Placement  Intra-op Plan:   Post-operative Plan: Post-operative intubation/ventilation  Informed Consent:   Plan Discussed with: CRNA  Anesthesia Plan Comments: (Case delayed due to dental bleeding. )       Anesthesia Quick Evaluation

## 2019-09-23 NOTE — Progress Notes (Signed)
Patient arrives to short stay and has complaints of bleeding from his recent teeth extraction.  Patient states that he woke up to a puddle of blood this AM.   Dr. Cyndia Bent made aware and states that he will evaluate patient.  Anesthesia and OR made aware.

## 2019-09-24 ENCOUNTER — Encounter (HOSPITAL_COMMUNITY): Payer: Self-pay | Admitting: Internal Medicine

## 2019-09-24 DIAGNOSIS — I361 Nonrheumatic tricuspid (valve) insufficiency: Secondary | ICD-10-CM

## 2019-09-24 DIAGNOSIS — R0989 Other specified symptoms and signs involving the circulatory and respiratory systems: Secondary | ICD-10-CM | POA: Diagnosis not present

## 2019-09-24 LAB — URIC ACID: Uric Acid, Serum: 7.1 mg/dL (ref 3.7–8.6)

## 2019-09-24 LAB — CBC
HCT: 25.5 % — ABNORMAL LOW (ref 39.0–52.0)
Hemoglobin: 8.8 g/dL — ABNORMAL LOW (ref 13.0–17.0)
MCH: 29.2 pg (ref 26.0–34.0)
MCHC: 34.5 g/dL (ref 30.0–36.0)
MCV: 84.7 fL (ref 80.0–100.0)
Platelets: 175 10*3/uL (ref 150–400)
RBC: 3.01 MIL/uL — ABNORMAL LOW (ref 4.22–5.81)
RDW: 16.3 % — ABNORMAL HIGH (ref 11.5–15.5)
WBC: 6.4 10*3/uL (ref 4.0–10.5)
nRBC: 0 % (ref 0.0–0.2)

## 2019-09-24 LAB — PROTIME-INR
INR: 1.2 (ref 0.8–1.2)
Prothrombin Time: 15 seconds (ref 11.4–15.2)

## 2019-09-24 LAB — GLUCOSE, CAPILLARY
Glucose-Capillary: 129 mg/dL — ABNORMAL HIGH (ref 70–99)
Glucose-Capillary: 161 mg/dL — ABNORMAL HIGH (ref 70–99)
Glucose-Capillary: 118 mg/dL — ABNORMAL HIGH (ref 70–99)
Glucose-Capillary: 97 mg/dL (ref 70–99)

## 2019-09-24 LAB — BASIC METABOLIC PANEL
Anion gap: 7 (ref 5–15)
BUN: 14 mg/dL (ref 6–20)
CO2: 22 mmol/L (ref 22–32)
Calcium: 9.1 mg/dL (ref 8.9–10.3)
Chloride: 110 mmol/L (ref 98–111)
Creatinine, Ser: 1.3 mg/dL — ABNORMAL HIGH (ref 0.61–1.24)
GFR calc Af Amer: >60 mL/min (ref >60)
GFR calc non Af Amer: >60 mL/min (ref >60)
Glucose, Bld: 155 mg/dL — ABNORMAL HIGH (ref 70–99)
Potassium: 3.6 mmol/L (ref 3.5–5.1)
Sodium: 139 mmol/L (ref 135–145)

## 2019-09-24 LAB — HEPARIN LEVEL (UNFRACTIONATED)
Heparin Unfractionated: 0.32 IU/mL (ref 0.30–0.70)
Heparin Unfractionated: 0.38 IU/mL (ref 0.30–0.70)

## 2019-09-24 LAB — LACTATE DEHYDROGENASE: LDH: 256 U/L — ABNORMAL HIGH (ref 98–192)

## 2019-09-24 MED ORDER — GENTAMICIN SULFATE 40 MG/ML IJ SOLN
220.0000 mg | INTRAVENOUS | Status: DC
  Administered 2019-09-24 – 2019-09-27 (×3): 220 mg via INTRAVENOUS
  Filled 2019-09-24 (×5): qty 5.5

## 2019-09-24 NOTE — Progress Notes (Signed)
1 Day Post-Op Procedure(s): Cancelled Procedure per Dr. Cyndia Bent Subjective: No complaints. Ambulating well. No further bleeding from mouth since yesterday am. No pain in mouth.  Back on heparin with level 0.38 today.  Objective: Vital signs in last 24 hours: Temp:  [99.4 F (37.4 C)-99.5 F (37.5 C)] 99.4 F (37.4 C) (05/04 0559) Pulse Rate:  [94-96] 96 (05/04 0559) Cardiac Rhythm: Normal sinus rhythm (05/04 0700) Resp:  [16-18] 16 (05/04 0559) BP: (128-130)/(88) 130/88 (05/04 0559) SpO2:  [97 %-100 %] 97 % (05/04 0559) Weight:  [130.6 kg] 130.6 kg (05/04 0559)  Hemodynamic parameters for last 24 hours:    Intake/Output from previous day: 05/03 0701 - 05/04 0700 In: 133 [I.V.:133] Out: -  Intake/Output this shift: No intake/output data recorded.  General appearance: alert and cooperative Neurologic: intact Heart: regular rate and rhythm and crisp mechanical valve click Lungs: clear to auscultation bilaterally  Lab Results: Recent Labs    09/23/19 0458 09/24/19 0427  WBC 8.1 6.4  HGB 9.3* 8.8*  HCT 26.7* 25.5*  PLT 199 175   BMET:  Recent Labs    09/23/19 0458 09/24/19 0427  NA 139 139  K 3.6 3.6  CL 108 110  CO2 24 22  GLUCOSE 142* 155*  BUN 12 14  CREATININE 1.30* 1.30*  CALCIUM 9.0 9.1    PT/INR:  Recent Labs    09/24/19 0427  LABPROT 15.0  INR 1.2   ABG    Component Value Date/Time   PHART 7.414 09/16/2019 0936   HCO3 23.4 09/16/2019 0936   HCO3 22.8 09/16/2019 0936   TCO2 24 09/16/2019 0936   TCO2 24 09/16/2019 0936   ACIDBASEDEF 1.0 09/16/2019 0936   ACIDBASEDEF 2.0 09/16/2019 0936   O2SAT 72.0 09/16/2019 0936   O2SAT 99.0 09/16/2019 0936   CBG (last 3)  Recent Labs    09/24/19 0745 09/24/19 1139 09/24/19 1654  GLUCAP 129* 161* 118*    Assessment/Plan:  He is doing well clinically and has no further bleeding from extraction sites. Pathology of T10 biopsy shows plasma cell neoplasm. Seen by oncology and workup to rule out  multiple myeloma in progress. I will plan to proceed with surgery on Thursday am. Answered further questions for patient and wife today. I will see tomorrow and write preop orders.  LOS: 16 days    Gaye Pollack 09/24/2019

## 2019-09-24 NOTE — Progress Notes (Signed)
ANTICOAGULATION CONSULT NOTE - Follow Up Consult  Pharmacy Consult for warfarin (held) > heparin Indication: atrial fibrillation and + mechanical AVR  Allergies  Allergen Reactions  . Diltiazem Hives  . Insulin Glargine Swelling    edema    Patient Measurements: Height: 6\' 2"  (188 cm) Weight: 130.6 kg (287 lb 14.4 oz) IBW/kg (Calculated) : 82.2 Heparin Dosing Weight: 131.5 kg   Vital Signs: Temp: 99.4 F (37.4 C) (05/04 0559) Temp Source: Oral (05/04 0559) BP: 130/88 (05/04 0559) Pulse Rate: 96 (05/04 0559)  Labs: Recent Labs    09/22/19 0541 09/22/19 1356 09/23/19 0458 09/24/19 0427 09/24/19 1044  HGB 8.8*  --  9.3* 8.8*  --   HCT 25.7*  --  26.7* 25.5*  --   PLT 184  --  199 175  --   LABPROT 16.4*  --  15.7* 15.0  --   INR 1.4*  --  1.3* 1.2  --   HEPARINUNFRC 0.70   < > 0.67 0.32 0.38  CREATININE 1.38*  --  1.30* 1.30*  --    < > = values in this interval not displayed.    Estimated Creatinine Clearance: 93.4 mL/min (A) (by C-G formula based on SCr of 1.3 mg/dL (H)).   Medications:  Infusions:  . sodium chloride    . cefTRIAXone (ROCEPHIN)  IV 2 g (09/24/19 0955)  . gentamicin    . heparin 1,900 Units/hr (09/24/19 1005)  . lactated ringers      Assessment: Assessment: 53 YOM on warfarin PTA for Afib and mechanical AVR (INR goal 2.5- 3.5). Vegetation seen on TTE this admission. Warfarin is held and bridged with IV heparin with impending need for procedures. He is s/p cath 4/26,  teeth extraction 4/27 and T10 lesion biopsy 4/28. Heparin restarted 4/29. Noted s/p biopsy on 4/29 and plan CABG and redo AVR on 5/3.  Heparin level at goal this AM.  Gum bleeding has subsided.  Warfarin PTA dosing: 5mg  MWF, 7.5mg  all other days   Goal of Therapy:  Heparin level 0.3-0.4 units/mL Monitor platelets by anticoagulation protocol: Yes   Plan:  CABG postponed given continued bleeding at gums- planning Thursday 5/6 Continue IV heparin at current rate. Monitor  daily heparin level, CBC, and for s/sx of bleeding   Marguerite Olea, [facility] Clinical Pharmacist Phone [phone]  09/24/2019 12:32 PM

## 2019-09-24 NOTE — Progress Notes (Signed)
ANTICOAGULATION CONSULT NOTE - Follow Up Consult  Pharmacy Consult for heparin Indication: Afib/MVR  Labs: Recent Labs    09/22/19 0541 09/22/19 0541 09/22/19 1356 09/23/19 0458 09/24/19 0427  HGB 8.8*  --   --  9.3*  --   HCT 25.7*  --   --  26.7*  --   PLT 184  --   --  199  --   LABPROT 16.4*  --   --  15.7* 15.0  INR 1.4*  --   --  1.3* 1.2  HEPARINUNFRC 0.70   < > 0.55 0.67 0.32  CREATININE 1.38*  --   --  1.30*  --    < > = values in this interval not displayed.    Assessment/Plan:  54yo male therapeutic on heparin after resumed at lower rate. Will continue gtt at current rate and confirm stable with additional level.   Wynona Neat, PharmD, BCPS  09/24/2019,5:17 AM

## 2019-09-24 NOTE — Progress Notes (Signed)
[place] for Infectious Disease  Date of Admission:  09/08/2019     Total days of antibiotics 15         ASSESSMENT:  [name] has Streptococcus infantarius bacteremia complicated with tricuspid valve endocarditis and subsequent findings of T10 lesion with plasma cell neoplasm. Scheduled for sternotomy and aortic root replacement on 5/6. Will need to remain on ceftriaxone and gentamycin for prosthetic heart valve infection with need for gentamycin for duration of therapy due to intermediate penicillin status on sensitivities. Likely to require 6 weeks of IV therapy post-surgery date. Currently undergoing work up by Oncology for T10 plasma cell neoplasm.   PLAN:  1. Continue ceftriaxone and gentamycin. 2. Therapeutic drug monitoring of renal function while on gentamycin.  3. Surgery planned for 5/6.  4. Work up of T10 plasma neoplasm per Oncology.   Principal Problem:   Suspected endocarditis mechanical aortic valve Active Problems:   Diabetes mellitus type II, non insulin dependent (HCC)   Hyperlipidemia   Essential hypertension   PAF (paroxysmal atrial fibrillation) (HCC)   Hypothyroidism   Normocytic anemia   Shortness of breath   Obesity (BMI 30-39.9)   CAD (coronary artery disease)   Nausea vomiting and diarrhea   Streptococcal bacteremia   Endocarditis of tricuspid valve   Plasmacytoma ([place])   . aspirin EC  81 mg Oral Daily  . atorvastatin  40 mg Oral Q breakfast  . colchicine  0.6 mg Oral BID  . feeding supplement  1 Container Oral BID BM  . feeding supplement (ENSURE ENLIVE)  237 mL Oral BID BM  . feeding supplement (PRO-STAT SUGAR FREE 64)  30 mL Oral BID  . gabapentin  300 mg Oral Q breakfast  . insulin aspart  0-15 Units Subcutaneous TID WC  . insulin aspart  0-5 Units Subcutaneous QHS  . insulin aspart  6 Units Subcutaneous TID WC  . insulin detemir  6 Units Subcutaneous BID  . levothyroxine  50 mcg Oral Q0600  . multivitamin with minerals  1  tablet Oral Daily  . polyethylene glycol  17 g Oral BID  . sodium chloride flush  10-40 mL Intracatheter Q12H  . sodium chloride flush  3 mL Intravenous Once  . sodium chloride flush  3 mL Intravenous Q12H    SUBJECTIVE:  Afebrile overnight. Bleeding from recent dental procedure resolved. Working on finding foods he likes to eat. Awaiting redo sternotomy and aortic root replacement scheduled for 5/6.   Allergies  Allergen Reactions  . Diltiazem Hives  . Insulin Glargine Swelling    edema     Review of Systems: Review of Systems  Constitutional: Negative for chills, fever and weight loss.  Respiratory: Negative for cough, shortness of breath and wheezing.   Cardiovascular: Negative for chest pain and leg swelling.  Gastrointestinal: Negative for abdominal pain, constipation, diarrhea, nausea and vomiting.  Skin: Negative for rash.      OBJECTIVE: Vitals:   09/23/19 1331 09/23/19 2001 09/23/19 2052 09/24/19 0559  BP: (!) 134/93 128/88  130/88  Pulse: 87 94  96  Resp: 20 18  16   Temp: 98.2 F (36.8 C) 99.5 F (37.5 C)  99.4 F (37.4 C)  TempSrc: Oral Oral  Oral  SpO2: 100% 98% 100% 97%  Weight:    130.6 kg  Height:       Body mass index is 36.96 kg/m.  Physical Exam Constitutional:      General: He is not in acute distress.  Appearance: He is well-developed.     Comments: Seated on the side of the bed; pleasant.   Cardiovascular:     Rate and Rhythm: Normal rate and regular rhythm.     Heart sounds: Normal heart sounds.  Pulmonary:     Effort: Pulmonary effort is normal.     Breath sounds: Normal breath sounds.  Skin:    General: Skin is warm and dry.  Neurological:     Mental Status: He is alert and oriented to person, place, and time.  Psychiatric:        Behavior: Behavior normal.        Thought Content: Thought content normal.        Judgment: Judgment normal.     Lab Results Lab Results  Component Value Date   WBC 6.4 09/24/2019   HGB 8.8  (L) 09/24/2019   HCT 25.5 (L) 09/24/2019   MCV 84.7 09/24/2019   PLT 175 09/24/2019    Lab Results  Component Value Date   CREATININE 1.30 (H) 09/24/2019   BUN 14 09/24/2019   NA 139 09/24/2019   K 3.6 09/24/2019   CL 110 09/24/2019   CO2 22 09/24/2019    Lab Results  Component Value Date   ALT 11 09/18/2019   AST 22 09/18/2019   ALKPHOS 68 09/18/2019   BILITOT 0.7 09/18/2019     Microbiology: Recent Results (from the past 240 hour(s))  Surgical PCR screen     Status: None   Collection Time: 09/16/19  3:34 PM   Specimen: Nasal Mucosa; Nasal Swab  Result Value Ref Range Status   MRSA, PCR NEGATIVE NEGATIVE Final   Staphylococcus aureus NEGATIVE NEGATIVE Final    Comment: (NOTE) The Xpert SA Assay (FDA approved for NASAL specimens in patients 75 years of age and older), is one component of a comprehensive surveillance program. It is not intended to diagnose infection nor to guide or monitor treatment. Performed at [facility], [place] [address], [place], [place] [address]   Surgical pcr screen     Status: None   Collection Time: 09/23/19  4:39 AM   Specimen: Nasal Mucosa; Nasal Swab  Result Value Ref Range Status   MRSA, PCR NEGATIVE NEGATIVE Final   Staphylococcus aureus NEGATIVE NEGATIVE Final    Comment: (NOTE) The Xpert SA Assay (FDA approved for NASAL specimens in patients 48 years of age and older), is one component of a comprehensive surveillance program. It is not intended to diagnose infection nor to guide or monitor treatment. Performed at [facility], [place] [address], [place], [place] [address]      Terri Piedra, [place] for Infectious Disease [facility]  09/24/2019  3:50 PM

## 2019-09-24 NOTE — Progress Notes (Signed)
PROGRESS NOTE    [name]  [id number] DOB: 10-Nov-1965 DOA: 09/08/2019 PCP: Biagio Borg, MD   Brief Narrative:  54 y.o.malewith history of CAD s/p CABG in 2000, SVG-RCA PCI with BMS in Feb 2015, he had re-do PCI in June 2019 DES to SVG graft, ascending aortic dissection in 2000 s/p repair with AVR on warfarin. Paroxysmal A. fib. NIDDM-2, HTN and HLD presenting with 5 days of chills, nausea, vomiting, diarrhea and dyspnea on exertion and orthopnea mainly with lying on the left side. He's been found to have infective endocarditis with strep infantarius bacteremia.  He had a TEE showing large vegetations on the septal leaflet of the tricuspid valve and on the posterior disc of the aortic valve.  He's on ceftriaxone/gentamicin for his endocarditis at this point.  ID and cardiology are following.  He was seen by dental medicine 4/27 for multiple tooth extraction, alveoloplasty, debridement.  He's also been seen by surgery for a left buttocks abscess which has spontaneously drained and by orthopedics for L MTP pain and swelling which is thought to be gout.    Incidental T10 lesion finding, biopsy performed on 4/28 showed plasma cell neoplasm.  Oncology consulted. Dr. Cyndia Bent from [facility] planning for surgery on Thursday 5/6.   Assessment & Plan:   Principal Problem:   Suspected endocarditis mechanical aortic valve Active Problems:   Diabetes mellitus type II, non insulin dependent (HCC)   Hyperlipidemia   Essential hypertension   PAF (paroxysmal atrial fibrillation) (HCC)   Hypothyroidism   Normocytic anemia   Shortness of breath   Obesity (BMI 30-39.9)   CAD (coronary artery disease)   Nausea vomiting and diarrhea   Streptococcal bacteremia   Endocarditis of tricuspid valve   Plasmacytoma (HCC)   Infective endocarditis -tricuspid vegetation with regurgitation strep Infantarius Bacteremia:  -Per infectious disease last day of gentamicin tomorrow.  1 more week of IV  Rocephin.  CT surgery plans for redo sternotomy, aortic root replacement planned 5/6. -Continue heparin drip, lower target 0.3.  Back pain fluctuant mass around mid back, hematoma -CT chest performed, no evidence of active bleeding.  Small hematoma secondary to trauma from biopsy while on heparin drip.  T10 lesion, plasma cell neoplasm -New diagnosis, incidental finding of T10 lesion on MRI underwent biopsy 4/28 resulted on 5/3 evening.  Oncology team following for further work-up.  Bleeding gums with poor dentition status post tooth extraction -Subsided, back on heparin drip.  Seen by dentist.  Left Buttocks Abscess: Spontaneous drainage.  Seen by general surgery.  Flagyl discontinued 4/29.  Routine dressing per surgery and wound care  Left MTP Pain, concerning for gout continue colchicine.  Seen by orthopedic no further recommendations.  Iron Def Anemia:  Iron supplements with bowel regimen B12, folate-normal  CAD s/p CABG in 2000, SVG-RCA PCI with BMS in Feb 2015, he had re-do PCI in June 2019 DES to SVG graft: Echocardiogram as above. -Management per cardiology and CT surgery  Ascending aortic dissection in 2000 s/p repair with Mechanical AVR on warfarin.  -On Heparin -Monitor fluid status and renal function.  Paroxysmal A. fib.  -Continue heparin drip.  Uncontrolled NIDDM-2 with hyperglycemia: A1c 8.2%. -ContinueSSI-moderate -Levemir 6 units twice daily, NovoLog 6 units 3 times daily -Continue statin.   CKD stage IIIa (vancomycin, Lasix and losartan) -Baseline creatinine 1.2-1.3. -Holding metoprolol, Lasix and losartan -Continue monitoring  Essential HTN: -As needed as needed.  Currently home meds on hold  HLD  -Continue atorvastatin.  Hypothyroidism: -Continue home  Synthroid.   Morbid obesity:Body mass index is 36.03 kg/m.With comorbidities as above. -Encourage lifestyle change to lose weight.  Would benefit from outpatient sleep  study   DVT prophylaxis: Heparin drip, currently paused Code Status: Full code Family Communication: None at bedside Disposition Plan:   Patient From= home  Patient Anticipated D/C place= to be determined  Barriers= plan CT surgery but currently postponed due to, bleeding, maintain hospital stay  Subjective: Sitting at the side of the bed using incentive spirometer, denies any complaints.  Gum bleeding has subsided.  Review of Systems Otherwise negative except as per HPI, including: General: Denies fever, chills, night sweats or unintended weight loss. Resp: Denies cough, wheezing, shortness of breath. Cardiac: Denies chest pain, palpitations, orthopnea, paroxysmal nocturnal dyspnea. GI: Denies abdominal pain, nausea, vomiting, diarrhea or constipation GU: Denies dysuria, frequency, hesitancy or incontinence MS: Denies muscle aches, joint pain or swelling Neuro: Denies headache, neurologic deficits (focal weakness, numbness, tingling), abnormal gait Psych: Denies anxiety, depression, SI/HI/AVH Skin: Denies new rashes or lesions ID: Denies sick contacts, exotic exposures, travel  Examination: Constitutional: Not in acute distress, gum bleeding has subsided. Respiratory: Clear to auscultation bilaterally Cardiovascular: Normal sinus rhythm, no rubs Abdomen: Nontender nondistended good bowel sounds Musculoskeletal: No edema noted Skin: No rashes seen Neurologic: CN 2-12 grossly intact.  And nonfocal Psychiatric: Normal judgment and insight. Alert and oriented x 3. Normal mood. Objective: Vitals:   09/23/19 1331 09/23/19 2001 09/23/19 2052 09/24/19 0559  BP: (!) 134/93 128/88  130/88  Pulse: 87 94  96  Resp: 20 18  16   Temp: 98.2 F (36.8 C) 99.5 F (37.5 C)  99.4 F (37.4 C)  TempSrc: Oral Oral  Oral  SpO2: 100% 98% 100% 97%  Weight:    130.6 kg  Height:        Intake/Output Summary (Last 24 hours) at 09/24/2019 1049 Last data filed at 09/24/2019 0817 Gross per 24 hour   Intake 493 ml  Output 850 ml  Net -357 ml   Filed Weights   09/22/19 0509 09/23/19 0504 09/24/19 0559  Weight: 132.1 kg 131 kg 130.6 kg     Data Reviewed:   CBC: Recent Labs  Lab 09/20/19 0351 09/21/19 0308 09/22/19 0541 09/23/19 0458 09/24/19 0427  WBC 7.7 7.5 6.9 8.1 6.4  HGB 8.8* 8.6* 8.8* 9.3* 8.8*  HCT 24.8* 24.9* 25.7* 26.7* 25.5*  MCV 83.2 82.2 84.0 83.7 84.7  PLT 195 171 184 199 0000000   Basic Metabolic Panel: Recent Labs  Lab 09/18/19 0642 09/19/19 1653 09/20/19 0351 09/21/19 0308 09/22/19 0541 09/23/19 0458 09/24/19 0427  NA 137   < > 139 139 139 139 139  K 3.8   < > 3.6 3.8 3.7 3.6 3.6  CL 106   < > 111 110 109 108 110  CO2 24   < > 22 22 23 24 22   GLUCOSE 143*   < > 139* 147* 136* 142* 155*  BUN 13   < > 11 11 12 12 14   CREATININE 1.38*   < > 1.28* 1.34* 1.38* 1.30* 1.30*  CALCIUM 8.7*   < > 8.2* 8.4* 8.7* 9.0 9.1  MG 2.0  --   --   --   --   --   --   PHOS 3.5  --   --   --   --   --   --    < > = values in this interval not displayed.   GFR: Estimated Creatinine Clearance:  93.4 mL/min (A) (by C-G formula based on SCr of 1.3 mg/dL (H)). Liver Function Tests: Recent Labs  Lab 09/18/19 0642  AST 22  ALT 11  ALKPHOS 68  BILITOT 0.7  PROT 7.7  ALBUMIN 2.7*   No results for input(s): LIPASE, AMYLASE in the last 168 hours. No results for input(s): AMMONIA in the last 168 hours. Coagulation Profile: Recent Labs  Lab 09/20/19 0351 09/21/19 0308 09/22/19 0541 09/23/19 0458 09/24/19 0427  INR 1.7* 1.6* 1.4* 1.3* 1.2   Cardiac Enzymes: No results for input(s): CKTOTAL, CKMB, CKMBINDEX, TROPONINI in the last 168 hours. BNP (last 3 results) No results for input(s): PROBNP in the last 8760 hours. HbA1C: No results for input(s): HGBA1C in the last 72 hours. CBG: Recent Labs  Lab 09/23/19 0738 09/23/19 1119 09/23/19 1523 09/23/19 2056 09/24/19 0745  GLUCAP 138* 127* 127* 177* 129*   Lipid Profile: No results for input(s): CHOL,  HDL, LDLCALC, TRIG, CHOLHDL, LDLDIRECT in the last 72 hours. Thyroid Function Tests: No results for input(s): TSH, T4TOTAL, FREET4, T3FREE, THYROIDAB in the last 72 hours. Anemia Panel: No results for input(s): VITAMINB12, FOLATE, FERRITIN, TIBC, IRON, RETICCTPCT in the last 72 hours. Sepsis Labs: No results for input(s): PROCALCITON, LATICACIDVEN in the last 168 hours.  Recent Results (from the past 240 hour(s))  Surgical PCR screen     Status: None   Collection Time: 09/16/19  3:34 PM   Specimen: Nasal Mucosa; Nasal Swab  Result Value Ref Range Status   MRSA, PCR NEGATIVE NEGATIVE Final   Staphylococcus aureus NEGATIVE NEGATIVE Final    Comment: (NOTE) The Xpert SA Assay (FDA approved for NASAL specimens in patients 19 years of age and older), is one component of a comprehensive surveillance program. It is not intended to diagnose infection nor to guide or monitor treatment. Performed at [facility], [place] [address], [place], [place] [address]   Surgical pcr screen     Status: None   Collection Time: 09/23/19  4:39 AM   Specimen: Nasal Mucosa; Nasal Swab  Result Value Ref Range Status   MRSA, PCR NEGATIVE NEGATIVE Final   Staphylococcus aureus NEGATIVE NEGATIVE Final    Comment: (NOTE) The Xpert SA Assay (FDA approved for NASAL specimens in patients 51 years of age and older), is one component of a comprehensive surveillance program. It is not intended to diagnose infection nor to guide or monitor treatment. Performed at [facility], [place] [address] [place], [place] [address]          Radiology Studies: No results found.      Scheduled Meds: . aspirin EC  81 mg Oral Daily  . atorvastatin  40 mg Oral Q breakfast  . colchicine  0.6 mg Oral BID  . feeding supplement  1 Container Oral BID BM  . feeding supplement (ENSURE ENLIVE)  237 mL Oral BID BM  . feeding supplement (PRO-STAT SUGAR FREE 64)  30 mL Oral BID  . gabapentin  300 mg Oral Q breakfast   . insulin aspart  0-15 Units Subcutaneous TID WC  . insulin aspart  0-5 Units Subcutaneous QHS  . insulin aspart  6 Units Subcutaneous TID WC  . insulin detemir  6 Units Subcutaneous BID  . levothyroxine  50 mcg Oral Q0600  . multivitamin with minerals  1 tablet Oral Daily  . polyethylene glycol  17 g Oral BID  . sodium chloride flush  10-40 mL Intracatheter Q12H  . sodium chloride flush  3 mL Intravenous Once  . sodium chloride flush  3 mL Intravenous Q12H   Continuous Infusions: . sodium chloride    . cefTRIAXone (ROCEPHIN)  IV 2 g (09/24/19 0955)  . gentamicin 220 mg (09/23/19 0502)  . heparin 1,900 Units/hr (09/24/19 1005)  . lactated ringers       LOS: 16 days   Time spent= 35 mins    Yaslin Kirtley Arsenio Loader, MD [facility]  If 7PM-7AM, please contact night-coverage  09/24/2019, 10:49 AM

## 2019-09-24 NOTE — Consult Note (Addendum)
[place]  [phone] [phone]   MEDICAL ONCOLOGY - INITIAL CONSULTATION  Referral MD: Dr. Gerlean Ren  Reason for Referral: Plasma cell neoplasm  HPI: [name] is a 54 year old male with a past medical history significant for hypertension, hyperlipidemia, paroxysmal atrial fibrillation maintained on Coumadin, chronic diastolic heart failure, TIA in 2004, diabetes mellitus, CAD status post CABG in 2000, descending aortic dissection in 2000 status post repair of aortic dissection with replacement of mechanical AVR.  He presented to the emergency room with a 5-day history of nausea, vomiting, and diarrhea.  He was also having chills, headache, shortness of breath.  On admission, his hemoglobin was 10.4, BNP 535, INR 2.3.  Chest x-ray showed mild cardiomegaly without pulmonary edema.  During this admission, he was found to have infective endocarditis with strep infantarius bacteremia.  He had a TEE which showed large vegetations on the septal leaflet of the tricuspid valve and on the posterior disc of the aortic valve.  He is being followed by cardiology and ID.  He has been receiving IV antibiotics and was also seen by dental medicine.  As part of his preoperative evaluation for potential redo Bentall procedure, he had a CT angiogram of the chest, abdomen, pelvis.  There was an incidental finding of an aggressive appearing lesion in the T10 vertebral body which extends into the right paravertebral soft tissues and involvement of the head of the right 10th rib with marked narrowing of the central spinal canal.  He had an MRI of the thoracic spine performed on 09/12/2019 which again demonstrated an aggressive appearing mass centered within T10 and also extending into the right T10 pedicle and articular pillar, right paraspinal extension of this mass with this component measuring 2.6 cm and involving the head of the right 10th rib, posterior bony expansion and facet  hypertrophy contribute to severe spinal canal stenosis at the T10 level with near complete effacement of the thecal sac, moderate/severe right T10-T11 neural foraminal narrowing, subcentimeter foci of enhancement within T7, T8, and T12 vertebrae which are indeterminate.  The patient had a fluoroscopy guided T10 vertebral body biopsy performed on 09/18/2019 and the biopsy was consistent with plasma cell neoplasm.  The patient was scheduled to have his redo sternotomy for redo aortic root replacement with homograft or porcine root performed on 09/23/2019.  However, he developed bleeding gums and his surgery was canceled.  This is now tentatively rescheduled for 09/26/2019.  When seen today, the patient reports that he is feeling well.  He is not really having any back pain or any bone pain.  He reports that his GI symptoms including diarrhea, nausea, and vomiting have resolved.  His shortness of breath has improved.  He reports that he has had a good appetite.  His weight has actually increased recently which he attributes to edema.  No fevers, chills, night sweats. Weight is slowly declining with diuresis.  He denies headaches and dizziness.  Denies chest pain.  Reports ongoing lower extremity edema.  No recurrent bleeding from his mouth.  He has not noticed any other bleeding such as epistaxis, hemoptysis, hematemesis, hematuria, melena, hematochezia. He reports a "lump" on his mid-back which is not painful and he feels as though this is getting smaller. The patient is married.  He has 4 children.  Denies history of alcohol tobacco use.  Reports family history of probable lung cancer in his father.  Medical oncology was asked see the patient to make recommendations regarding his newly  diagnosed plasma cell neoplasm.   Past Medical History:  Diagnosis Date  . Ascending aortic dissection ([place])    a. 2000 - with aortic valve involvement. S/p repair of aortic dissection with placement of mechanical AVR.  [name] CAD  (coronary artery disease)    a. 2000 VG->PDA @ time of Ao dissection repair;  b. Cardiac CT 06/2010: no CAD, only mild plaque in prox RCA;  c. 2015 Cath: LM nl, LAD nl, LCX nl, RCA 100, VG->PDA 90 (4.5x12 Rebel BMS). d. 10/23/17 instent restenosis SVG to RCA, DES placed.  . Cholelithiasis 08/22/2013  . Chronic diastolic CHF (congestive heart failure) ([place])    a. 03/2016 Echo: EF 55-60%, no rwma, Gr2 DD.  . Diabetes mellitus   . Dyspnea   . Gross hematuria   . H/O mechanical aortic valve replacement    a. 03/2016 Echo: AoV mean gradient 62mHg, Valve Area (VTI) 1.36 cm^2, (Vmax) 1.22 cm^2, mod dil LA.  .Marland KitchenHyperlipidemia   . Hypertension   . NEPHROLITHIASIS, HX OF   . Obesity   . PA[place] ([place]oxysmal atrial fibrillation) ([place]    a. H/o such, with recurrence of coarse afib/flutter during ER consult 03/2012.  . [place]roke ([place]   . TRANSIENT ISCHEMIC ATTACK, HX OF    a. In 2004.  .Marland KitchenUnspecified vitamin D deficiency   :  Past Surgical History:  Procedure Laterality Date  . AORTIC VALVE REPLACEMENT  2000  . APPENDECTOMY  1998  . CORONARY ARTERY BYPASS GRAFT  2000  . CORONARY STENT INTERVENTION N/A 10/23/2017   Procedure: CORONARY STENT INTERVENTION;  Surgeon: VJettie Booze MD;  [facility];  Service: Cardiovascular;  Laterality: N/A;  . IR FLUORO GUIDED NEEDLE PLC ASPIRATION/INJECTION LOC  09/18/2019  . LEFT AND RIGHT HEART CATHETERIZATION WITH CORONARY ANGIOGRAM N/A 07/19/2013   Procedure: LEFT AND RIGHT HEART CATHETERIZATION WITH CORONARY ANGIOGRAM;  Surgeon: JJettie Booze MD;  [facility];  Service: Cardiovascular;  Laterality: N/A;  . LUMBAR LAMINECTOMY/DECOMPRESSION MICRODISCECTOMY Right 06/06/2018   Procedure: Right L3-4 disectomy;  Surgeon: BMelina Schools MD;  [place]  Service: Orthopedics;  Laterality: Right;  2.5 hrs  . MULTIPLE EXTRACTIONS WITH ALVEOLOPLASTY Bilateral 09/17/2019   Procedure: EXTRACTION OF TOOTH #'S 15,95,63,87,56WITH ALVEOLOPLASTY  AND GROSS DEBRIDEMENT OF REMAINING TEETH;  Surgeon: KLenn Cal DDS;  [facility]  Service: Oral Surgery;  Laterality: Bilateral;  . RIGHT HEART CATH AND CORONARY/GRAFT ANGIOGRAPHY N/A 10/23/2017   Procedure: RIGHT HEART CATH AND CORONARY/GRAFT ANGIOGRAPHY;  Surgeon: VJettie Booze MD;  [facility];  Service: Cardiovascular;  Laterality: N/A;  . RIGHT HEART CATH AND CORONARY/GRAFT ANGIOGRAPHY N/A 09/16/2019   Procedure: RIGHT HEART CATH AND CORONARY/GRAFT ANGIOGRAPHY;  Surgeon: JMartinique Peter M, MD;  [facility];  Service: Cardiovascular;  Laterality: N/A;  . TEE WITHOUT CARDIOVERSION N/A 07/22/2013   Procedure: TRANSESOPHAGEAL ECHOCARDIOGRAM (TEE);  Surgeon: PJosue Hector MD;  [place] [facility]  Service: Cardiovascular;  Laterality: N/A;  . TEE WITHOUT CARDIOVERSION N/A 09/11/2019   Procedure: TRANSESOPHAGEAL ECHOCARDIOGRAM (TEE);  Surgeon: CSanda Klein MD;  [facility];  Service: Cardiovascular;  Laterality: N/A;  :  Current Facility-Administered Medications  Medication Dose Route Frequency Provider Last Rate Last Admin  . 0.9 %  sodium chloride infusion  250 mL Intravenous PRN JMartinique Peter M, MD      . acetaminophen (TYLENOL) tablet 650 mg  650 mg Oral Q6H PRN KLenn Cal DDS  650 mg at 09/19/19 1923   Or  . acetaminophen (TYLENOL) suppository 650 mg  650 mg Rectal Q6H PRN Lenn Cal, DDS      . aspirin EC tablet 81 mg  81 mg Oral Daily Lenn Cal, DDS   81 mg at 09/23/19 0850  . atorvastatin (LIPITOR) tablet 40 mg  40 mg Oral Q breakfast Lenn Cal, DDS   40 mg at 09/24/19 8469  . cefTRIAXone (ROCEPHIN) 2 g in sodium chloride 0.9 % 100 mL IVPB  2 g Intravenous Q24H Teena Dunk F, DDS 200 mL/hr at 09/23/19 1142 2 g at 09/23/19 1142  . colchicine tablet 0.6 mg  0.6 mg Oral BID Teena Dunk F, DDS   0.6 mg at 09/23/19 2137  . feeding supplement (BOOST / RESOURCE BREEZE) liquid 1  Container  1 Container Oral BID BM Amin, Ankit Chirag, MD      . feeding supplement (ENSURE ENLIVE) (ENSURE ENLIVE) liquid 237 mL  237 mL Oral BID BM Amin, Ankit Chirag, MD   237 mL at 09/23/19 1615  . feeding supplement (PRO-STAT SUGAR FREE 64) liquid 30 mL  30 mL Oral BID Elodia Florence., MD   30 mL at 09/24/19 0814  . gabapentin (NEURONTIN) capsule 300 mg  300 mg Oral Q breakfast Lenn Cal, DDS   300 mg at 09/24/19 0813  . gentamicin (GARAMYCIN) 220 mg in dextrose 5 % 50 mL IVPB  220 mg Intravenous Q24H Michel Bickers, MD 111 mL/hr at 09/23/19 0502 220 mg at 09/23/19 0502  . heparin ADULT infusion 100 units/mL (25000 units/224m sodium chloride 0.45%)  1,900 Units/hr Intravenous Continuous ADamita Lack MD 19 mL/hr at 09/23/19 2047 1,900 Units/hr at 09/23/19 2047  . HYDROcodone-acetaminophen (NORCO/VICODIN) 5-325 MG per tablet 1-2 tablet  1-2 tablet Oral Q4H PRN KLenn Cal DDS      . insulin aspart (novoLOG) injection 0-15 Units  0-15 Units Subcutaneous TID WC KLenn Cal DDS   2 Units at 09/24/19 0813  . insulin aspart (novoLOG) injection 0-5 Units  0-5 Units Subcutaneous QHS KLenn Cal DDS   2 Units at 09/18/19 2230  . insulin aspart (novoLOG) injection 6 Units  6 Units Subcutaneous TID WC KLenn Cal DDS   6 Units at 09/24/19 0813  . insulin detemir (LEVEMIR) injection 6 Units  6 Units Subcutaneous BID KLenn Cal DDS   6 Units at 09/23/19 2138  . lactated ringers infusion   Intravenous Continuous KTeena DunkF, DDS      . levothyroxine (SYNTHROID) tablet 50 mcg  50 mcg Oral Q0600 KLenn Cal DDS   50 mcg at 09/24/19 06295 . multivitamin with minerals tablet 1 tablet  1 tablet Oral Daily Amin, AJeanella Flattery MD   1 tablet at 09/23/19 1811  . nitroGLYCERIN (NITROSTAT) SL tablet 0.4 mg  0.4 mg Sublingual Q5 Min x 3 PRN KTeena DunkF, DDS      . ondansetron ([facility] tablet 4 mg  4 mg Oral Q6H PRN KLenn Cal  DDS       Or  . ondansetron ([facility] injection 4 mg  4 mg Intravenous Q6H PRN KTeena DunkF, DDS      . polyethylene glycol (MIRALAX / GLYCOLAX) packet 17 g  17 g Oral BID KTeena DunkF, DDS   17 g at 09/14/19 2135  . sodium chloride flush (NS) 0.9 % injection 10-40 mL  10-40 mL Intracatheter Q12H Amin, Ankit  Chirag, MD   10 mL at 09/23/19 2140  . sodium chloride flush (NS) 0.9 % injection 10-40 mL  10-40 mL Intracatheter PRN Amin, Ankit Chirag, MD      . sodium chloride flush (NS) 0.9 % injection 3 mL  3 mL Intravenous Once Teena Dunk F, DDS      . sodium chloride flush (NS) 0.9 % injection 3 mL  3 mL Intravenous Q12H Martinique, Peter M, MD   3 mL at 09/23/19 1252  . sodium chloride flush (NS) 0.9 % injection 3 mL  3 mL Intravenous PRN Martinique, Peter M, MD         Allergies  Allergen Reactions  . Diltiazem Hives  . Insulin Glargine Swelling    edema  :  Family History  Problem Relation Age of Onset  . Coronary artery disease Mother   . Hypertension Mother   . Diabetes Brother   . Coronary artery disease Other 29       male 1st degree relative, CABG in his 7's (father)  . Cancer Father        ? lung  :  Social History   Socioeconomic History  . Marital status: Married    Spouse name: Not on file  . Number of children: 4  . Years of education: Not on file  . Highest education level: Not on file  Occupational History  . Occupation: DISABLED    Employer: UNEMPLOYED  Tobacco Use  . Smoking status: Never Smoker  . Smokeless tobacco: Never Used  Substance and Sexual Activity  . Alcohol use: No    Alcohol/week: 0.0 standard drinks  . Drug use: No  . Sexual activity: Not on file  Other Topics Concern  . Not on file  Social History Narrative  . Not on file   Social Determinants of Health   Financial Resource Strain:   . Difficulty of Paying Living Expenses:   Food Insecurity:   . Worried About Charity fundraiser in the Last Year:   . Arboriculturist in  the Last Year:   Transportation Needs:   . Film/video editor (Medical):   [name] Lack of Transportation (Non-Medical):   Physical Activity:   . Days of Exercise per Week:   . Minutes of Exercise per Session:   Stress:   . Feeling of Stress :   Social Connections:   . Frequency of Communication with Friends and Family:   . Frequency of Social Gatherings with Friends and Family:   . Attends Religious Services:   . Active Member of Clubs or Organizations:   . Attends Archivist Meetings:   [name] Marital Status:   Intimate Partner Violence:   . Fear of Current or Ex-Partner:   . Emotionally Abused:   [name] Physically Abused:   . Sexually Abused:   :  Review of Systems: A comprehensive 14 point review of systems was negative except as noted in the HPI.  Exam: Patient Vitals for the past 24 hrs:  BP Temp Temp src Pulse Resp SpO2 Weight  09/24/19 0559 130/88 99.4 F (37.4 C) Oral 96 16 97 % 130.6 kg  09/23/19 2052 -- -- -- -- -- 100 % --  09/23/19 2001 128/88 99.5 F (37.5 C) Oral 94 18 98 % --  09/23/19 1331 (!) 134/93 98.2 F (36.8 C) Oral 87 20 100 % --    General:  well-nourished in no acute distress.   Eyes:  no scleral icterus.  ENT: No bleeding gums noted, there were no oropharyngeal lesions.   Neck was without thyromegaly.   Lymphatics:  Negative cervical, supraclavicular or axillary adenopathy.   Respiratory: lungs were clear bilaterally without wheezing or crackles.   Cardiovascular: Irregular, murmur noted, 1+ bilateral lower extremity edema GI:  abdomen was soft, flat, nontender, nondistended, without organomegaly.   Musculoskeletal: No spinal tenderness with palpation, mass noted over thoracic spine with Steri-Strip intact.   Skin: No petechiae, multiple bruises on his bilateral arms secondary to lab draws.   Neuro exam was nonfocal. Patient was alert and oriented.  Attention was good.   Language was appropriate.  Mood was normal without depression.  Speech was  not pressured.  Thought content was not tangential.     Lab Results  Component Value Date   WBC 6.4 09/24/2019   HGB 8.8 (L) 09/24/2019   HCT 25.5 (L) 09/24/2019   PLT 175 09/24/2019   GLUCOSE 155 (H) 09/24/2019   CHOL 132 04/02/2019   TRIG 123.0 04/02/2019   HDL 35.00 (L) 04/02/2019   LDLDIRECT 50.0 10/02/2018   LDLCALC 73 04/02/2019   ALT 11 09/18/2019   AST 22 09/18/2019   NA 139 09/24/2019   K 3.6 09/24/2019   CL 110 09/24/2019   CREATININE 1.30 (H) 09/24/2019   BUN 14 09/24/2019   CO2 22 09/24/2019    DG Orthopantogram  Result Date: 09/11/2019 CLINICAL DATA:  Pre-op testing. Molar pain. EXAM: ORTHOPANTOGRAM/PANORAMIC COMPARISON:  None. FINDINGS: Several teeth are absent. There is typical midline artifact of the Panorex technique. Significant dental caries involving the right maxillary wisdom tooth and the most posterior remaining left mandibular molar. That tooth also demonstrates significant periodontal disease. No evidence of acute fracture or dislocation. IMPRESSION: Dental caries and periodontal disease as described. Electronically Signed   By: Richardean Sale M.D.   On: 09/11/2019 18:04   DG Chest 2 View  Result Date: 09/08/2019 CLINICAL DATA:  Dyspnea, nausea, vomiting EXAM: CHEST - 2 VIEW COMPARISON:  10/21/2017 chest radiograph. FINDINGS: Intact sternotomy wires. Aortic valve prosthesis is in place. Stable cardiomediastinal silhouette with mild cardiomegaly. No pneumothorax. No pleural effusion. Lungs appear clear, with no acute consolidative airspace disease and no pulmonary edema. IMPRESSION: Stable mild cardiomegaly without pulmonary edema. No active pulmonary disease. Electronically Signed   By: Ilona Sorrel M.D.   On: 09/08/2019 11:11   CT CHEST W CONTRAST  Addendum Date: 09/21/2019   ADDENDUM REPORT: 09/21/2019 18:33 ADDENDUM: In the soft tissues posterior to the T10 vertebral body there is a small 3.4 x 1.5 cm collection in the subcutaneous fat. This is  consistent with a small post biopsy hematoma. There is no evidence for active extravasation. Electronically Signed   By: Constance Holster M.D.   On: 09/21/2019 18:33   Result Date: 09/21/2019 CLINICAL DATA:  Chest pain. Shortness of breath. Concern for pleural effusion. EXAM: CT CHEST WITH CONTRAST TECHNIQUE: Multidetector CT imaging of the chest was performed during intravenous contrast administration. CONTRAST:  42m OMNIPAQUE IOHEXOL 300 MG/ML  SOLN COMPARISON:  09/10/2019 FINDINGS: Cardiovascular: The patient is again status Bentall procedure. A chronic dissection is again noted involving the aortic arch. The heart size is stable but enlarged. The main pulmonary artery is dilated but stable from prior study. Aortic calcifications are noted. Atherosclerotic changes are noted of the thoracic aorta. There is no pericardial effusion. Mediastinum/Nodes: --No mediastinal or hilar lymphadenopathy. --No axillary lymphadenopathy. --No supraclavicular lymphadenopathy. --Normal thyroid gland. --The esophagus is unremarkable Lungs/Pleura: No pulmonary nodules  or masses. No pleural effusion or pneumothorax. No focal airspace consolidation. No focal pleural abnormality. Upper Abdomen: There is cholelithiasis. Musculoskeletal: Again noted is a lytic lesion involving the T10 vertebral body. This lesion extends into the posterior tenth rib on the right. Subtle additional lytic lesions are noted in various osseous structures including the anterior fifth rib on the left. Review of the MIP images confirms the above findings. IMPRESSION: 1. No acute abnormality. 2. Again noted is an aggressive appearing lytic lesion in the T10 vertebral body. Additional smaller subtle lytic lesions are noted involving the ribs, especially the anterior left fifth rib. 3. Cholelithiasis. Aortic Atherosclerosis (ICD10-I70.0). Electronically Signed: By: Constance Holster M.D. On: 09/21/2019 17:10   MR THORACIC SPINE W WO CONTRAST  Result Date:  09/12/2019 CLINICAL DATA:  Bone lesion EXAM: MRI THORACIC WITHOUT AND WITH CONTRAST TECHNIQUE: Multiplanar and multiecho pulse sequences of the thoracic spine were obtained without and with intravenous contrast. CONTRAST:  62m GADAVIST GADOBUTROL 1 MMOL/ML IV SOLN COMPARISON:  CTA chest/abdomen/pelvis 09/12/2019 FINDINGS: MRI THORACIC SPINE FINDINGS The examination is intermittently motion degraded. No scout localizer imaging is provided. Spinal numbering is ascertained by numbering caudally from the C2 level on same-day CTA chest/abdomen/pelvis. Alignment: No significant spondylolisthesis. Vertebrae: There is mild loss of T10 vertebral body height. Vertebral body height is otherwise maintained. There is prominent STIR hyperintensity and enhancement within the central and right aspect of the right T10 vertebral body also extending partly into the right T10 pedicle and transverse process. The mass extends into the right paravertebral soft tissues, with this component measuring 2.1 x 1.7 x 2.6 cm (AP x TV x CC) (series 15, image 54) (series 7, image 2). As demonstrated on CT performed earlier the same day, there is involvement of the head of the right tenth rib. The T10 lesion is slightly expansile and there is a convex posterior vertebral margin. There are multiple additional subcentimeter foci of enhancement within the thoracic spine which are indeterminate. These are present within the T7, T8 and T12 vertebrae. Cord: No spinal cord signal abnormality or abnormal cord enhancement. No abnormal enhancement is identified within the spinal canal. Paraspinal and other soft tissues: Right paraspinal extension of a T10 vertebral body mass as described above. The paraspinal soft tissues are otherwise unremarkable. Disc levels: No more than mild disc degeneration at any level. At the T10 level, bony expansion and facet hypertrophy contribute to severe spinal canal stenosis with near complete effacement of the thecal sac.  There may be mild cord impingement. Bony expansion and facet arthrosis also contributes to moderate/severe right neural foraminal narrowing at T10-T11. At the remaining thoracic levels, there is no focal disc herniation. There is multilevel facet arthrosis and ligamentum flavum hypertrophy which is greatest in the lower thoracic spine. No more than mild spinal canal narrowing at these remaining levels. No significant foraminal narrowing at the remaining levels. IMPRESSION: Again demonstrated is an aggressive appearing mass centered within the T10 vertebral body also extending into the right T10 pedicle and articular pillar. There is right paraspinal extension of this mass with this component measuring 2.6 cm and involving the head of the right tenth rib. There is mild loss of T10 vertebral body height. Primary differential considerations include plasmacytoma, metastatic disease, osteoblastoma or other aggressive primary bone lesion. Consider direct tissue sampling. Posterior bony expansion and facet hypertrophy contribute to severe spinal canal stenosis at the T10 level with near complete effacement of the thecal sac. Moderate/severe right T10-T11 neural foraminal narrowing. There  are subcentimeter foci of enhancement within the T7, T8 and T12 vertebrae which are indeterminate. Electronically Signed   By: Kellie Simmering DO   On: 09/12/2019 20:42   CARDIAC CATHETERIZATION  Result Date: 09/16/2019  Ost RCA to Prox RCA lesion is 100% stenosed.  SVG to RVA with Origin lesion is 80% stenosed in stent.  Hemodynamic findings consistent with mild pulmonary hypertension.  1. Single vessel occlusive CAD with CTO of the ostium of the RCA 2. SVG to RCA with 80% in stent stenosis at the ostium. The RCA is a large vessel. 3. Mildly elevated LV filling pressures 4. Mild pulmonary HTN 5. Normal cardiac output. Plan: Redo AVR and CABG to RCA.   IR Fluoro Guide Ndl Plmt / BX  Result Date: 09/19/2019 INDICATION: 54  year-old-male with a T10 lytic lesion involving the T10 vertebral body extending to the right pedicle. EXAM: Fluoroscopy guided fine needle aspiration and core bone biopsy of T10 vertebral body. MEDICATIONS: None. ANESTHESIA/SEDATION: Moderate (conscious) sedation was employed during this procedure. A total of Versed 3 mg and Fentanyl 150 mcg was administered intravenously. Moderate Sedation Time: 54 minutes. The patient's level of consciousness and vital signs were monitored continuously by radiology nursing throughout the procedure under my direct supervision. FLUOROSCOPY TIME:  Fluoroscopy Time: 8 minutes 48 seconds (1554 mGy). COMPLICATIONS: None immediate. PROCEDURE: Informed written consent was obtained from the patient after a thorough discussion of the procedural risks, benefits and alternatives. All questions were addressed. Maximal Sterile Barrier Technique was utilized including caps, mask, sterile gowns, sterile gloves, sterile drape, hand hygiene and skin antiseptic. A timeout was performed prior to the initiation of the procedure. The patient was placed in prone position on the angiography table. The thoracic spine region was prepped and draped in a sterile fashion. Under fluoroscopy, the T10 vertebral body was delineated and the skin area was marked. The skin was infiltrated with a 1% Lidocaine approximately 3 cm lateral to the spinous process projection on the right. Using a 22-gauge spinal needle, the soft issue and the peripedicular space and periosteum were infiltrated with Bupivacaine 0.5%. A skin incision was made at the access site. Subsequently, an 11-gauge Kyphon trocar was inserted under fluoroscopic guidance until contact with the pedicle was obtained. The trocar was inserted under light hammer tapping into the pedicle until the posterior boundaries of the vertebral body was reached. The diamond mandrill was removed and 3 final needle aspirations and one core biopsy wereobtained. The access  sites were cleaned and covered with a sterile bandage. IMPRESSION: Fluoroscopy-guided T10 vertebral body a fine-needle aspiration core bone biopsy . Bone samples obtained were sent for pathology analysis. Electronically Signed   By: Pedro Earls M.D.   On: 09/19/2019 16:27   DG Foot Complete Left  Result Date: 09/15/2019 CLINICAL DATA:  Gout. EXAM: LEFT FOOT - COMPLETE 3+ VIEW COMPARISON:  None. FINDINGS: There is no evidence of fracture or dislocation. Very mild degenerative changes are seen involving the metatarsophalangeal joint of the left great toe. Soft tissues are unremarkable. IMPRESSION: Very mild degenerative changes without evidence of acute osseous abnormality. Electronically Signed   By: Virgina Norfolk M.D.   On: 09/15/2019 21:30   CT ANGIO CHEST AORTA W/CM & OR WO/CM  Result Date: 09/12/2019 CLINICAL DATA:  54 year old male under preoperative evaluation prior to potential redo Bentall procedure. EXAM: CT ANGIOGRAPHY CHEST, ABDOMEN AND PELVIS TECHNIQUE: Non-contrast CT of the chest was initially obtained. Multidetector CT imaging through the chest, abdomen and  pelvis was performed using the standard protocol during bolus administration of intravenous contrast. Multiplanar reconstructed images and MIPs were obtained and reviewed to evaluate the vascular anatomy. CONTRAST:  151m OMNIPAQUE IOHEXOL 350 MG/ML SOLN COMPARISON:  Chest CTA 10/09/2014. CTA of the chest, abdomen and pelvis 07/24/2013. FINDINGS: CTA CHEST FINDINGS Cardiovascular: Heart size is enlarged. There is no significant pericardial fluid, thickening or pericardial calcification. 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 There is a small defect on the undersurface of the mechanical aortic valve best appreciated on coronal image 82 of series 8 measuring approximately 8 mm, which may represent valve associated thrombus and/or vegetation.  The aortic arch is mildly aneurysmal measuring up to 4.3 cm in diameter. There is a residual dissection flap in the proximal and mid aortic arch which does not propagate into the great vessels. Descending thoracic aorta is normal in caliber measuring 2.8 cm in diameter. Bovine type thoracic aortic arch incidentally noted. There is a right coronary artery bypass graft. Large filling defect in the superior aspect of the right atrium immediately posterior to the septal leaflet of the tricuspid valve, and intimately associated with the adjacent aortic root, estimated to measure approximately 2.2 x 2.2 x 1.7 cm (axial image 83 of series 6 and coronal image 73 of series 8), presumably a vegetation. Mediastinum/Nodes: No pathologically enlarged mediastinal or hilar lymph nodes. Esophagus is unremarkable in appearance. No axillary lymphadenopathy. Lungs/Pleura: No suspicious pulmonary nodules or masses are noted. No acute consolidative airspace disease. No pleural effusions. Musculoskeletal: In the T10 vertebral body there is a large lucent lesion with extension of soft tissue into the right paravertebral region and involvement of the head of the right tenth rib overall estimated to measure approximately 4.9 x 3.4 x 2.0 cm (axial image 105 of series 6 and sagittal image 117 of series 9). This causes significant narrowing of the central spinal canal (approximately 5 mm AP) at this level. Median sternotomy wires. Review of the MIP images confirms the above findings. CTA ABDOMEN AND PELVIS FINDINGS VASCULAR Aorta: Mild aortic atherosclerosis. Normal caliber aorta without aneurysm, dissection, vasculitis or significant stenosis. Celiac: Patent without evidence of aneurysm, dissection, vasculitis or significant stenosis. SMA: Patent without evidence of aneurysm, dissection, vasculitis or significant stenosis. Renals: Both renal arteries are patent without evidence of aneurysm, dissection, vasculitis, fibromuscular dysplasia or  significant stenosis. IMA: Patent without evidence of aneurysm, dissection, vasculitis or significant stenosis. Inflow: Patent without evidence of aneurysm, dissection, vasculitis or significant stenosis. Veins: No obvious venous abnormality within the limitations of this arterial phase study. Review of the MIP images confirms the above findings. NON-VASCULAR Hepatobiliary: No suspicious appearing cystic or solid hepatic lesions are confidently identified on today's arterial phase examination. No intra or extrahepatic biliary ductal dilatation. Several partially calcified gallstones are noted measuring up to 1.6 cm in diameter. Gallbladder is otherwise unremarkable in appearance. Pancreas: No pancreatic mass. No pancreatic ductal dilatation. No pancreatic or peripancreatic fluid collections or inflammatory changes. Spleen: Unremarkable. Adrenals/Urinary Tract: Bilateral kidneys and adrenal glands are normal in appearance. No hydroureteronephrosis. Urinary bladder is normal in appearance. Stomach/Bowel: Normal appearance of the stomach. No pathologic dilatation of small bowel or colon. The appendix is not confidently identified and may be surgically absent. Regardless, there are no inflammatory changes noted adjacent to the cecum to suggest the presence of an acute appendicitis at this time. Lymphatic: No lymphadenopathy noted in the abdomen or pelvis. Reproductive: Prostate gland and seminal vesicles  are unremarkable in appearance. Other: No significant volume of ascites.  No pneumoperitoneum. Musculoskeletal: There are no aggressive appearing lytic or blastic lesions noted in the visualized portions of the skeleton. Review of the MIP images confirms the above findings. IMPRESSION: 1. 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 2. Small lesions associated  with the undersurface of the mechanical aortic valve and the superior aspect of the septal leaflet of the tricuspid valve, which may reflect valve associated thrombus and/or vegetations, as detailed above. 3. Aggressive appearing lesion in T10 vertebral body with extends in into the right paravertebral soft tissues and involvement of the head of the right tenth rib, with marked narrowing of the central spinal canal at this level, as detailed above. Primary differential considerations include a plasmacytoma, or less likely and osteoblastoma or solitary metastatic lesion (no primary lesion is confidently identified). If there is clinical concern for spinal cord compression, the full extent of this lesion could be better evaluated with thoracic spine MRI with and without IV gadolinium. 4. Bovine type thoracic aortic arch (normal anatomical variant) incidentally noted. 5. Cholelithiasis without evidence of acute cholecystitis at this time. 6. Additional incidental findings, as above. These results will be called to the ordering clinician or representative by the Radiologist Assistant, and communication documented in the PACS or [employer]. Electronically Signed   By: Vinnie Langton M.D.   On: 09/12/2019 13:02   ECHOCARDIOGRAM COMPLETE  Result Date: 09/09/2019    ECHOCARDIOGRAM REPORT   Patient Name:   [name] Date of Exam: 09/09/2019 Medical Rec #:  [id number]        Height:       74.0 in Accession #:    [id number]       Weight:       282.2 lb Date of Birth:  [birth year]        BSA:          2.516 m Patient Age:    9 years         BP:           120/74 mmHg Patient Gender: M                HR:           77 bpm. Exam Location:  Inpatient Procedure: 2D Echo, Cardiac Doppler and Color Doppler Indications:    Dyspnea 786.09  History:        Patient has prior history of Echocardiogram examinations, most                 recent 10/24/2017. CAD, Prior CABG, Signs/Symptoms:Shortness of                 Breath and  Chest Pain; Risk Factors:Hypertension, Diabetes,                 Dyslipidemia and Non-Smoker. Bentall.                 Aortic Valve: mechanical valve is present in the aortic                 position. Procedure Date: 2000.  Sonographer:    Vickie Epley RDCS Referring Phys: [id number] [place]  1. Tricuspid valve vegetation. Discussed with referring provider.  2. Left ventricular ejection fraction, by estimation, is 55 to 60%. The left ventricle has normal function.  The left ventricle has no regional wall motion abnormalities. Left ventricular diastolic parameters were normal.  3. Right ventricular systolic function is normal. The right ventricular size is mildly enlarged. There is normal pulmonary artery systolic pressure.  4. Left atrial size was moderately dilated.  5. Right atrial size was severely dilated.  6. The mitral valve is normal in structure. Mild mitral valve regurgitation. No evidence of mitral stenosis.  7. There is a 1.8 x 1.1 cm tricuspid valve mass on the atrial surface consistent with vegetation. . Tricuspid valve regurgitation is moderate.  8. Transaortic velocity is higher than expected for mechanical aortic valve. The aortic valve is normal in structure. Aortic valve regurgitation is not visualized. No aortic stenosis is present. There is a mechanical valve present in the aortic position. Procedure Date: 2000. Aortic valve mean gradient measures 33.5 mmHg. Aortic valve Vmax measures 3.68 m/s.  9. Aortic root/ascending aorta has been repaired/replaced and Bentall. 10. The inferior vena cava is normal in size with greater than 50% respiratory variability, suggesting right atrial pressure of 3 mmHg. FINDINGS  Left Ventricle: Left ventricular ejection fraction, by estimation, is 55 to 60%. The left ventricle has normal function. The left ventricle has no regional wall motion abnormalities. The left ventricular internal cavity size was normal in size. There is  no left ventricular  hypertrophy. Left ventricular diastolic parameters were normal. Right Ventricle: The right ventricular size is mildly enlarged. No increase in right ventricular wall thickness. Right ventricular systolic function is normal. There is normal pulmonary artery systolic pressure. The tricuspid regurgitant velocity is 2.51  m/s, and with an assumed right atrial pressure of 3 mmHg, the estimated right ventricular systolic pressure is 66.2 mmHg. Left Atrium: Left atrial size was moderately dilated. Right Atrium: Right atrial size was severely dilated. Pericardium: There is no evidence of pericardial effusion. Mitral Valve: The mitral valve is normal in structure. Normal mobility of the mitral valve leaflets. Mild mitral annular calcification. Mild mitral valve regurgitation. No evidence of mitral valve stenosis. Tricuspid Valve: There is a 1.8 x 1.1 cm tricuspid valve mass on the atrial surface consistent with vegetation. The tricuspid valve is normal in structure. Tricuspid valve regurgitation is moderate . No evidence of tricuspid stenosis. Aortic Valve: Transaortic velocity is higher than expected for mechanical aortic valve. The aortic valve is normal in structure. Aortic valve regurgitation is not visualized. No aortic stenosis is present. Aortic valve mean gradient measures 33.5 mmHg. Aortic valve peak gradient measures 54.3 mmHg. Aortic valve area, by VTI measures 0.93 cm. There is a mechanical valve present in the aortic position. Procedure Date: 2000. Pulmonic Valve: The pulmonic valve was normal in structure. Pulmonic valve regurgitation is mild. No evidence of pulmonic stenosis. Aorta: The aortic root/ascending aorta has been repaired/replaced and Bentall. Venous: The inferior vena cava is normal in size with greater than 50% respiratory variability, suggesting right atrial pressure of 3 mmHg. IAS/Shunts: No atrial level shunt detected by color flow Doppler.  LEFT VENTRICLE PLAX 2D LVIDd:         5.60 cm       Diastology LVIDs:         4.60 cm      LV e' lateral:   13.10 cm/s LV PW:         1.10 cm      LV E/e' lateral: 5.7 LV IVS:        1.10 cm      LV e' medial:    8.49  cm/s LVOT diam:     2.20 cm      LV E/e' medial:  8.8 LV SV:         73 LV SV Index:   29 LVOT Area:     3.80 cm  LV Volumes (MOD) LV vol d, MOD A2C: 163.0 ml LV vol d, MOD A4C: 157.0 ml LV vol s, MOD A2C: 88.8 ml LV vol s, MOD A4C: 72.8 ml LV SV MOD A2C:     74.2 ml LV SV MOD A4C:     157.0 ml LV SV MOD BP:      77.9 ml RIGHT VENTRICLE RV S prime:     9.25 cm/s TAPSE (M-mode): 1.8 cm LEFT ATRIUM             Index       RIGHT ATRIUM           Index LA diam:        5.80 cm 2.31 cm/m  RA Area:     37.80 cm LA Vol (A2C):   65.8 ml 26.15 ml/m RA Volume:   141.00 ml 56.04 ml/m LA Vol (A4C):   76.5 ml 30.41 ml/m LA Biplane Vol: 77.8 ml 30.92 ml/m  AORTIC VALVE AV Area (Vmax):    0.95 cm AV Area (Vmean):   0.88 cm AV Area (VTI):     0.93 cm AV Vmax:           368.50 cm/s AV Vmean:          273.000 cm/s AV VTI:            0.786 m AV Peak Grad:      54.3 mmHg AV Mean Grad:      33.5 mmHg LVOT Vmax:         92.00 cm/s LVOT Vmean:        63.300 cm/s LVOT VTI:          0.193 m LVOT/AV VTI ratio: 0.25  AORTA Ao Root diam: 2.90 cm MITRAL VALVE               TRICUSPID VALVE MV Area (PHT): 5.02 cm    TR Peak grad:   25.2 mmHg MV Decel Time: 151 msec    TR Vmax:        251.00 cm/s MR Peak grad: 107.3 mmHg MR Vmax:      518.00 cm/s  SHUNTS MV E velocity: 74.60 cm/s  Systemic VTI:  0.19 m MV A velocity: 37.70 cm/s  Systemic Diam: 2.20 cm MV E/A ratio:  1.98 Candee Furbish MD Electronically signed by Candee Furbish MD Signature Date/Time: 09/09/2019/12:06:06 PM    Final    ECHO TEE  Result Date: 09/11/2019    TRANSESOPHOGEAL ECHO REPORT   Patient Name:   [name] Date of Exam: 09/11/2019 Medical Rec #:  [id number]        Height:       74.0 in Accession #:    [id number]       Weight:       280.0 lb Date of Birth:  02-08-1966        BSA:          2.508 m  Patient Age:    52 years         BP:           108/65 mmHg Patient Gender: M                HR:  106 bpm. Exam Location:  Inpatient Procedure: Transesophageal Echo, Cardiac Doppler and Color Doppler Indications:     Bacteremia.  History:         Patient has prior history of Echocardiogram examinations, most                  recent 09/09/2019. CAD, Aortic Valve Disease, Endocarditis,                  Tricuspid vegetation and Prosthetic Valve Complications,                  Signs/Symptoms:Altered Mental Status; Risk Factors:Diabetes,                  Hypertension and Dyslipidemia. Aortic valve replacement.                  Bentall procedure. Aortic dissection.                  Aortic Valve: unknown Medtronic tilting disk valve is present                  in the aortic position. Procedure Date: 2000.  Sonographer:     Roseanna Rainbow RDCS Referring Phys:  [id number] RHONDA G BARRETT Diagnosing Phys: Sanda Klein MD PROCEDURE: After discussion of the risks and benefits of a TEE, an informed consent was obtained from the patient. The transesophogeal probe was passed without difficulty through the esophogus of the patient. Imaged were obtained with the patient in a left lateral decubitus position. Sedation performed by different physician. The patient was monitored while under deep sedation. Anesthestetic sedation was provided intravenously by Anesthesiology: '230mg'$  of Propofol. The patient's vital signs; including heart rate, blood pressure, and oxygen saturation; remained stable throughout the procedure. The patient developed no complications during the procedure. IMPRESSIONS  1. Left ventricular ejection fraction, by estimation, is 55 to 60%. The left ventricle has normal function. The left ventricle has no regional wall motion abnormalities. There is mild concentric left ventricular hypertrophy.  2. Right ventricular systolic function is normal. The right ventricular size is mildly enlarged. There is mildly elevated  pulmonary artery systolic pressure. The estimated right ventricular systolic pressure is 06.2 mmHg.  3. It appears that the left atrial appendage has been surgically resected or oversown. Left atrial size was severely dilated. No left atrial/left atrial appendage thrombus was detected. The LAA emptying velocity was 35 cm/s.  4. Right atrial size was severely dilated.  5. The mitral valve is normal in structure. Mild mitral valve regurgitation.  6. There is a large, moderately mobile vegetation attached to the septal leaflet of the tricuspid valve, measuring 27 mm in length and 11 mm in width. It appears heterogeneous and friable.. Tricuspid valve regurgitation is moderate.  7. There is a large vegetation attached to the medial aspect of posterior disc or the posteromedial mechanical valve annulus. It is highly mobile and prolapses readily across the valve orifice. It measures 15 mm in length and 6 mm in width. Although there is no evidence of a periannular abscess cavity, the periannular tissue echodensity is heterogeneous, raining concern for inflammation/early abscess formation. The proximity of the aortic and the tricuspid vegetation to the same area of the interventricular septum also raises concern for intramyocardial abscess or fistula formation. The aortic valve has been repaired/replaced. Aortic valve regurgitation is trivial. Mild aortic valve stenosis. There is a unknown Medtronic tilting disk valve present in the aortic position. Procedure Date: 2000. Aortic  valve mean gradient measures 20.0 mmHg.  8. There is mild (Grade II) atheroma plaque involving the transverse aorta. Comparison(s): Compared to previous reports, there is evidence of vegetations on the tricuspid valve and the mechanical aortic valve and suspicion for early perivalvular abscess. The dimensionless aortic vale index is similar to older studies. This suggests that the increased aortic valve gradients are due to increased cardiac output  (rather than obstruction from the vegetation or compromised prosthetic disc motion). FINDINGS  Left Ventricle: Left ventricular ejection fraction, by estimation, is 55 to 60%. The left ventricle has normal function. The left ventricle has no regional wall motion abnormalities. The left ventricular internal cavity size was normal in size. There is  mild concentric left ventricular hypertrophy. Abnormal (paradoxical) septal motion consistent with post-operative status. Right Ventricle: The right ventricular size is mildly enlarged. No increase in right ventricular wall thickness. Right ventricular systolic function is normal. There is mildly elevated pulmonary artery systolic pressure. The tricuspid regurgitant velocity is 2.48 m/s, and with an assumed right atrial pressure of 8 mmHg, the estimated right ventricular systolic pressure is 21.3 mmHg. Left Atrium: It appears that the left atrial appendage has been surgically resected or oversown. Left atrial size was severely dilated. Spontaneous echo contrast was present in the left atrium. No left atrial/left atrial appendage thrombus was detected. The LAA emptying velocity was 35 cm/s. Right Atrium: Right atrial size was severely dilated. Prominent Eustachian valve. Pericardium: There is no evidence of pericardial effusion. Mitral Valve: The mitral valve is normal in structure. Mild mitral valve regurgitation, with centrally-directed jet. Tricuspid Valve: There is a large, moderately mobile vegetation attached to the septal leaflet of the tricuspid valve, measuring 27 mm in length and 11 mm in width. It appears heterogeneous and friable. The tricuspid valve is normal in structure. Tricuspid valve regurgitation is moderate. Aortic Valve: There is a large vegetation attached to the medial aspect of posterior disc or the posteromedial mechanical valve annulus. It is highly mobile and prolapses readily across the valve orifice. It measures 15 mm in length and 6 mm in  width. Although there is no evidence of a periannular abscess cavity, the periannular tissue echodensity is heterogeneous, raining concern for inflammation/early abscess formation. The proximity of the aortic and the tricuspid vegetation to the same area of the  interventricular septum also raises concern for intramyocardial abscess or fistula formation. The aortic valve has been repaired/replaced. Aortic valve regurgitation is trivial. Mild aortic stenosis is present. Aortic valve mean gradient measures 20.0 mmHg. Aortic valve peak gradient measures 32.9 mmHg. There is a unknown Medtronic tilting disk valve present in the aortic position. Procedure Date: 2000. Pulmonic Valve: The pulmonic valve was normal in structure. Pulmonic valve regurgitation is trivial. Aorta: The aortic root, ascending aorta, aortic arch and descending aorta are all structurally normal, with no evidence of dilitation or obstruction. There is mild (Grade II) atheroma plaque involving the transverse aorta. IAS/Shunts: No atrial level shunt detected by color flow Doppler.  AORTIC VALVE AV Vmax:           287.00 cm/s AV Vmean:          208.000 cm/s AV VTI:            0.444 m AV Peak Grad:      32.9 mmHg AV Mean Grad:      20.0 mmHg LVOT Vmax:         126.00 cm/s LVOT Vmean:        90.500 cm/s  LVOT VTI:          0.223 m LVOT/AV VTI ratio: 0.50 TRICUSPID VALVE TR Peak grad:   24.6 mmHg TR Vmax:        248.00 cm/s  SHUNTS Systemic VTI: 0.22 m Dani Gobble Croitoru MD Electronically signed by Sanda Klein MD Signature Date/Time: 09/11/2019/2:35:26 PM    Final    VAS US DOPPLER PRE CABG  Result Date: 09/12/2019 PREOPERATIVE VASCULAR EVALUATION  Indications:      Pre-CABG. Risk Factors:     Hypertension, hyperlipidemia, Diabetes. Comparison Study: No prior studies. Performing Technologist: Carlos Levering Rvt  Examination Guidelines: A complete evaluation includes B-mode imaging, spectral Doppler, color Doppler, and power Doppler as needed of all  accessible portions of each vessel. Bilateral testing is considered an integral part of a complete examination. Limited examinations for reoccurring indications may be performed as noted.  Right Carotid Findings: +----------+--------+--------+--------+-----------------------+--------+           PSV cm/sEDV cm/sStenosisDescribe               Comments +----------+--------+--------+--------+-----------------------+--------+ CCA Prox  157     16              smooth and heterogenoustortuous +----------+--------+--------+--------+-----------------------+--------+ CCA Distal46      13              smooth and heterogenous         +----------+--------+--------+--------+-----------------------+--------+ ICA Prox  50      16              smooth and heterogenous         +----------+--------+--------+--------+-----------------------+--------+ ICA Distal42      18                                     tortuous +----------+--------+--------+--------+-----------------------+--------+ ECA       74      8                                               +----------+--------+--------+--------+-----------------------+--------+ Portions of this table do not appear on this page. +----------+--------+-------+--------+------------+           PSV cm/sEDV cmsDescribeArm Pressure +----------+--------+-------+--------+------------+ Subclavian81                     134          +----------+--------+-------+--------+------------+ +---------+--------+--+--------+--+---------+ VertebralPSV cm/s33EDV cm/s10Antegrade +---------+--------+--+--------+--+---------+ Left Carotid Findings: +----------+--------+--------+--------+-----------------------+--------+           PSV cm/sEDV cm/sStenosisDescribe               Comments +----------+--------+--------+--------+-----------------------+--------+ CCA Prox  65      16              smooth and heterogenous          +----------+--------+--------+--------+-----------------------+--------+ CCA Distal62      9               smooth and heterogenous         +----------+--------+--------+--------+-----------------------+--------+ ICA Prox  40      17                                     tortuous +----------+--------+--------+--------+-----------------------+--------+ ICA Distal56  29                                     tortuous +----------+--------+--------+--------+-----------------------+--------+ ECA       45      6                                               +----------+--------+--------+--------+-----------------------+--------+ +----------+--------+--------+--------+------------+ SubclavianPSV cm/sEDV cm/sDescribeArm Pressure +----------+--------+--------+--------+------------+           71                      136          +----------+--------+--------+--------+------------+ +---------+--------+--+--------+-+---------+ VertebralPSV cm/s38EDV cm/s9Antegrade +---------+--------+--+--------+-+---------+  ABI Findings: +--------+------------------+-----+---------+--------+ Right   Rt Pressure (mmHg)IndexWaveform Comment  +--------+------------------+-----+---------+--------+ [id number]                    triphasic         +--------+------------------+-----+---------+--------+ PTA     147               1.08 triphasic         +--------+------------------+-----+---------+--------+ DP      176               1.29 triphasic         +--------+------------------+-----+---------+--------+ +--------+------------------+-----+---------+-------+ Left    Lt Pressure (mmHg)IndexWaveform Comment +--------+------------------+-----+---------+-------+ [id number]                    triphasic        +--------+------------------+-----+---------+-------+ PTA     122               0.90 triphasic        +--------+------------------+-----+---------+-------+ DP       155               1.14 triphasic        +--------+------------------+-----+---------+-------+ +-------+---------------+----------------+ ABI/TBIToday's ABI/TBIPrevious ABI/TBI +-------+---------------+----------------+ Right  1.29                            +-------+---------------+----------------+ Left   1.14                            +-------+---------------+----------------+  Right Doppler Findings: +--------+--------+-----+---------+--------+ Site    PressureIndexDoppler  Comments +--------+--------+-----+---------+--------+ [id number]          triphasic         +--------+--------+-----+---------+--------+ Radial               triphasic         +--------+--------+-----+---------+--------+ Ulnar                triphasic         +--------+--------+-----+---------+--------+  Left Doppler Findings: +--------+--------+-----+---------+--------+ Site    PressureIndexDoppler  Comments +--------+--------+-----+---------+--------+ [id number]          triphasic         +--------+--------+-----+---------+--------+ Radial               triphasic         +--------+--------+-----+---------+--------+ Ulnar                triphasic         +--------+--------+-----+---------+--------+  Summary: Right  Carotid: Velocities in the right ICA are consistent with a 1-39% stenosis. Left Carotid: Velocities in the left ICA are consistent with a 1-39% stenosis. Vertebrals: Bilateral vertebral arteries demonstrate antegrade flow. Right ABI: Resting right ankle-brachial index is within normal range. No evidence of significant right lower extremity arterial disease. Left ABI: Resting left ankle-brachial index is within normal range. No evidence of significant left lower extremity arterial disease. Right Upper Extremity: Doppler waveforms decrease >50% with right radial compression. Doppler waveform obliterate with right ulnar compression. Left Upper Extremity: Doppler  waveform obliterate with left radial compression. Doppler waveforms remain within normal limits with left ulnar compression.  Electronically signed by Monica Martinez MD on 09/12/2019 at 8:06:35 PM.    Final    [place] EKG SITE RITE  Result Date: 09/14/2019 If Site Rite image not attached, placement could not be confirmed due to current cardiac rhythm.  CT Angio Abd/Pel w/ and/or w/o  Result Date: 09/12/2019 CLINICAL DATA:  54 year old male under preoperative evaluation prior to potential redo Bentall procedure. EXAM: CT ANGIOGRAPHY CHEST, ABDOMEN AND PELVIS TECHNIQUE: Non-contrast CT of the chest was initially obtained. Multidetector CT imaging through the chest, abdomen and pelvis was performed using the standard protocol during bolus administration of intravenous contrast. Multiplanar reconstructed images and MIPs were obtained and reviewed to evaluate the vascular anatomy. CONTRAST:  142m OMNIPAQUE IOHEXOL 350 MG/ML SOLN COMPARISON:  Chest CTA 10/09/2014. CTA of the chest, abdomen and pelvis 07/24/2013. FINDINGS: CTA CHEST FINDINGS Cardiovascular: Heart size is enlarged. There is no significant pericardial fluid, thickening or pericardial calcification. 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 There is a small defect on the undersurface of the mechanical aortic valve best appreciated on coronal image 82 of series 8 measuring approximately 8 mm, which may represent valve associated thrombus and/or vegetation. The aortic arch is mildly aneurysmal measuring up to 4.3 cm in diameter. There is a residual dissection flap in the proximal and mid aortic arch which does not propagate into the great vessels. Descending thoracic aorta is normal in caliber measuring 2.8 cm in diameter. Bovine type thoracic aortic arch incidentally noted. There is a right coronary artery bypass graft. Large filling defect in the superior aspect of the right  atrium immediately posterior to the septal leaflet of the tricuspid valve, and intimately associated with the adjacent aortic root, estimated to measure approximately 2.2 x 2.2 x 1.7 cm (axial image 83 of series 6 and coronal image 73 of series 8), presumably a vegetation. Mediastinum/Nodes: No pathologically enlarged mediastinal or hilar lymph nodes. Esophagus is unremarkable in appearance. No axillary lymphadenopathy. Lungs/Pleura: No suspicious pulmonary nodules or masses are noted. No acute consolidative airspace disease. No pleural effusions. Musculoskeletal: In the T10 vertebral body there is a large lucent lesion with extension of soft tissue into the right paravertebral region and involvement of the head of the right tenth rib overall estimated to measure approximately 4.9 x 3.4 x 2.0 cm (axial image 105 of series 6 and sagittal image 117 of series 9). This causes significant narrowing of the central spinal canal (approximately 5 mm AP) at this level. Median sternotomy wires. Review of the MIP images confirms the above findings. CTA ABDOMEN AND PELVIS FINDINGS VASCULAR Aorta: Mild aortic atherosclerosis. Normal caliber aorta without aneurysm, dissection, vasculitis or significant stenosis. Celiac: Patent without evidence of aneurysm, dissection, vasculitis or significant stenosis. SMA: Patent without evidence of aneurysm, dissection, vasculitis or significant stenosis. Renals: Both renal arteries are  patent without evidence of aneurysm, dissection, vasculitis, fibromuscular dysplasia or significant stenosis. IMA: Patent without evidence of aneurysm, dissection, vasculitis or significant stenosis. Inflow: Patent without evidence of aneurysm, dissection, vasculitis or significant stenosis. Veins: No obvious venous abnormality within the limitations of this arterial phase study. Review of the MIP images confirms the above findings. NON-VASCULAR Hepatobiliary: No suspicious appearing cystic or solid hepatic  lesions are confidently identified on today's arterial phase examination. No intra or extrahepatic biliary ductal dilatation. Several partially calcified gallstones are noted measuring up to 1.6 cm in diameter. Gallbladder is otherwise unremarkable in appearance. Pancreas: No pancreatic mass. No pancreatic ductal dilatation. No pancreatic or peripancreatic fluid collections or inflammatory changes. Spleen: Unremarkable. Adrenals/Urinary Tract: Bilateral kidneys and adrenal glands are normal in appearance. No hydroureteronephrosis. Urinary bladder is normal in appearance. Stomach/Bowel: Normal appearance of the stomach. No pathologic dilatation of small bowel or colon. The appendix is not confidently identified and may be surgically absent. Regardless, there are no inflammatory changes noted adjacent to the cecum to suggest the presence of an acute appendicitis at this time. Lymphatic: No lymphadenopathy noted in the abdomen or pelvis. Reproductive: Prostate gland and seminal vesicles are unremarkable in appearance. Other: No significant volume of ascites.  No pneumoperitoneum. Musculoskeletal: There are no aggressive appearing lytic or blastic lesions noted in the visualized portions of the skeleton. Review of the MIP images confirms the above findings. IMPRESSION: 1. 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 2. Small lesions associated with the undersurface of the mechanical aortic valve and the superior aspect of the septal leaflet of the tricuspid valve, which may reflect valve associated thrombus and/or vegetations, as detailed above. 3. Aggressive appearing lesion in T10 vertebral body with extends in into the right paravertebral soft tissues and involvement of the head of the right tenth rib, with marked narrowing of the central spinal canal at  this level, as detailed above. Primary differential considerations include a plasmacytoma, or less likely and osteoblastoma or solitary metastatic lesion (no primary lesion is confidently identified). If there is clinical concern for spinal cord compression, the full extent of this lesion could be better evaluated with thoracic spine MRI with and without IV gadolinium. 4. Bovine type thoracic aortic arch (normal anatomical variant) incidentally noted. 5. Cholelithiasis without evidence of acute cholecystitis at this time. 6. Additional incidental findings, as above. These results will be called to the ordering clinician or representative by the Radiologist Assistant, and communication documented in the PACS or [employer]. Electronically Signed   By: Vinnie Langton M.D.   On: 09/12/2019 13:02     DG Orthopantogram  Result Date: 09/11/2019 CLINICAL DATA:  Pre-op testing. Molar pain. EXAM: ORTHOPANTOGRAM/PANORAMIC COMPARISON:  None. FINDINGS: Several teeth are absent. There is typical midline artifact of the Panorex technique. Significant dental caries involving the right maxillary wisdom tooth and the most posterior remaining left mandibular molar. That tooth also demonstrates significant periodontal disease. No evidence of acute fracture or dislocation. IMPRESSION: Dental caries and periodontal disease as described. Electronically Signed   By: Richardean Sale M.D.   On: 09/11/2019 18:04   DG Chest 2 View  Result Date: 09/08/2019 CLINICAL DATA:  Dyspnea, nausea, vomiting EXAM: CHEST - 2 VIEW COMPARISON:  10/21/2017 chest radiograph. FINDINGS: Intact sternotomy wires. Aortic valve prosthesis is in place. Stable cardiomediastinal silhouette with mild cardiomegaly. No pneumothorax. No pleural effusion. Lungs appear  clear, with no acute consolidative airspace disease and no pulmonary edema. IMPRESSION: Stable mild cardiomegaly without pulmonary edema. No active pulmonary disease. Electronically Signed    By: Ilona Sorrel M.D.   On: 09/08/2019 11:11   CT CHEST W CONTRAST  Addendum Date: 09/21/2019   ADDENDUM REPORT: 09/21/2019 18:33 ADDENDUM: In the soft tissues posterior to the T10 vertebral body there is a small 3.4 x 1.5 cm collection in the subcutaneous fat. This is consistent with a small post biopsy hematoma. There is no evidence for active extravasation. Electronically Signed   By: Constance Holster M.D.   On: 09/21/2019 18:33   Result Date: 09/21/2019 CLINICAL DATA:  Chest pain. Shortness of breath. Concern for pleural effusion. EXAM: CT CHEST WITH CONTRAST TECHNIQUE: Multidetector CT imaging of the chest was performed during intravenous contrast administration. CONTRAST:  18m OMNIPAQUE IOHEXOL 300 MG/ML  SOLN COMPARISON:  09/10/2019 FINDINGS: Cardiovascular: The patient is again status Bentall procedure. A chronic dissection is again noted involving the aortic arch. The heart size is stable but enlarged. The main pulmonary artery is dilated but stable from prior study. Aortic calcifications are noted. Atherosclerotic changes are noted of the thoracic aorta. There is no pericardial effusion. Mediastinum/Nodes: --No mediastinal or hilar lymphadenopathy. --No axillary lymphadenopathy. --No supraclavicular lymphadenopathy. --Normal thyroid gland. --The esophagus is unremarkable Lungs/Pleura: No pulmonary nodules or masses. No pleural effusion or pneumothorax. No focal airspace consolidation. No focal pleural abnormality. Upper Abdomen: There is cholelithiasis. Musculoskeletal: Again noted is a lytic lesion involving the T10 vertebral body. This lesion extends into the posterior tenth rib on the right. Subtle additional lytic lesions are noted in various osseous structures including the anterior fifth rib on the left. Review of the MIP images confirms the above findings. IMPRESSION: 1. No acute abnormality. 2. Again noted is an aggressive appearing lytic lesion in the T10 vertebral body. Additional smaller  subtle lytic lesions are noted involving the ribs, especially the anterior left fifth rib. 3. Cholelithiasis. Aortic Atherosclerosis (ICD10-I70.0). Electronically Signed: By: CConstance HolsterM.D. On: 09/21/2019 17:10   MR THORACIC SPINE W WO CONTRAST  Result Date: 09/12/2019 CLINICAL DATA:  Bone lesion EXAM: MRI THORACIC WITHOUT AND WITH CONTRAST TECHNIQUE: Multiplanar and multiecho pulse sequences of the thoracic spine were obtained without and with intravenous contrast. CONTRAST:  123mGADAVIST GADOBUTROL 1 MMOL/ML IV SOLN COMPARISON:  CTA chest/abdomen/pelvis 09/12/2019 FINDINGS: MRI THORACIC SPINE FINDINGS The examination is intermittently motion degraded. No scout localizer imaging is provided. Spinal numbering is ascertained by numbering caudally from the C2 level on same-day CTA chest/abdomen/pelvis. Alignment: No significant spondylolisthesis. Vertebrae: There is mild loss of T10 vertebral body height. Vertebral body height is otherwise maintained. There is prominent STIR hyperintensity and enhancement within the central and right aspect of the right T10 vertebral body also extending partly into the right T10 pedicle and transverse process. The mass extends into the right paravertebral soft tissues, with this component measuring 2.1 x 1.7 x 2.6 cm (AP x TV x CC) (series 15, image 54) (series 7, image 2). As demonstrated on CT performed earlier the same day, there is involvement of the head of the right tenth rib. The T10 lesion is slightly expansile and there is a convex posterior vertebral margin. There are multiple additional subcentimeter foci of enhancement within the thoracic spine which are indeterminate. These are present within the T7, T8 and T12 vertebrae. Cord: No spinal cord signal abnormality or abnormal cord enhancement. No abnormal enhancement is identified within the spinal canal. Paraspinal  and other soft tissues: Right paraspinal extension of a T10 vertebral body mass as described  above. The paraspinal soft tissues are otherwise unremarkable. Disc levels: No more than mild disc degeneration at any level. At the T10 level, bony expansion and facet hypertrophy contribute to severe spinal canal stenosis with near complete effacement of the thecal sac. There may be mild cord impingement. Bony expansion and facet arthrosis also contributes to moderate/severe right neural foraminal narrowing at T10-T11. At the remaining thoracic levels, there is no focal disc herniation. There is multilevel facet arthrosis and ligamentum flavum hypertrophy which is greatest in the lower thoracic spine. No more than mild spinal canal narrowing at these remaining levels. No significant foraminal narrowing at the remaining levels. IMPRESSION: Again demonstrated is an aggressive appearing mass centered within the T10 vertebral body also extending into the right T10 pedicle and articular pillar. There is right paraspinal extension of this mass with this component measuring 2.6 cm and involving the head of the right tenth rib. There is mild loss of T10 vertebral body height. Primary differential considerations include plasmacytoma, metastatic disease, osteoblastoma or other aggressive primary bone lesion. Consider direct tissue sampling. Posterior bony expansion and facet hypertrophy contribute to severe spinal canal stenosis at the T10 level with near complete effacement of the thecal sac. Moderate/severe right T10-T11 neural foraminal narrowing. There are subcentimeter foci of enhancement within the T7, T8 and T12 vertebrae which are indeterminate. Electronically Signed   By: Kellie Simmering DO   On: 09/12/2019 20:42   CARDIAC CATHETERIZATION  Result Date: 09/16/2019  Ost RCA to Prox RCA lesion is 100% stenosed.  SVG to RVA with Origin lesion is 80% stenosed in stent.  Hemodynamic findings consistent with mild pulmonary hypertension.  1. Single vessel occlusive CAD with CTO of the ostium of the RCA 2. SVG to RCA  with 80% in stent stenosis at the ostium. The RCA is a large vessel. 3. Mildly elevated LV filling pressures 4. Mild pulmonary HTN 5. Normal cardiac output. Plan: Redo AVR and CABG to RCA.   IR Fluoro Guide Ndl Plmt / BX  Result Date: 09/19/2019 INDICATION: 54 year-old-male with a T10 lytic lesion involving the T10 vertebral body extending to the right pedicle. EXAM: Fluoroscopy guided fine needle aspiration and core bone biopsy of T10 vertebral body. MEDICATIONS: None. ANESTHESIA/SEDATION: Moderate (conscious) sedation was employed during this procedure. A total of Versed 3 mg and Fentanyl 150 mcg was administered intravenously. Moderate Sedation Time: 54 minutes. The patient's level of consciousness and vital signs were monitored continuously by radiology nursing throughout the procedure under my direct supervision. FLUOROSCOPY TIME:  Fluoroscopy Time: 8 minutes 48 seconds (1554 mGy). COMPLICATIONS: None immediate. PROCEDURE: Informed written consent was obtained from the patient after a thorough discussion of the procedural risks, benefits and alternatives. All questions were addressed. Maximal Sterile Barrier Technique was utilized including caps, mask, sterile gowns, sterile gloves, sterile drape, hand hygiene and skin antiseptic. A timeout was performed prior to the initiation of the procedure. The patient was placed in prone position on the angiography table. The thoracic spine region was prepped and draped in a sterile fashion. Under fluoroscopy, the T10 vertebral body was delineated and the skin area was marked. The skin was infiltrated with a 1% Lidocaine approximately 3 cm lateral to the spinous process projection on the right. Using a 22-gauge spinal needle, the soft issue and the peripedicular space and periosteum were infiltrated with Bupivacaine 0.5%. A skin incision was made at the access  site. Subsequently, an 11-gauge Kyphon trocar was inserted under fluoroscopic guidance until contact with the  pedicle was obtained. The trocar was inserted under light hammer tapping into the pedicle until the posterior boundaries of the vertebral body was reached. The diamond mandrill was removed and 3 final needle aspirations and one core biopsy wereobtained. The access sites were cleaned and covered with a sterile bandage. IMPRESSION: Fluoroscopy-guided T10 vertebral body a fine-needle aspiration core bone biopsy . Bone samples obtained were sent for pathology analysis. Electronically Signed   By: Pedro Earls M.D.   On: 09/19/2019 16:27   DG Foot Complete Left  Result Date: 09/15/2019 CLINICAL DATA:  Gout. EXAM: LEFT FOOT - COMPLETE 3+ VIEW COMPARISON:  None. FINDINGS: There is no evidence of fracture or dislocation. Very mild degenerative changes are seen involving the metatarsophalangeal joint of the left great toe. Soft tissues are unremarkable. IMPRESSION: Very mild degenerative changes without evidence of acute osseous abnormality. Electronically Signed   By: Virgina Norfolk M.D.   On: 09/15/2019 21:30   CT ANGIO CHEST AORTA W/CM & OR WO/CM  Result Date: 09/12/2019 CLINICAL DATA:  54 year old male under preoperative evaluation prior to potential redo Bentall procedure. EXAM: CT ANGIOGRAPHY CHEST, ABDOMEN AND PELVIS TECHNIQUE: Non-contrast CT of the chest was initially obtained. Multidetector CT imaging through the chest, abdomen and pelvis was performed using the standard protocol during bolus administration of intravenous contrast. Multiplanar reconstructed images and MIPs were obtained and reviewed to evaluate the vascular anatomy. CONTRAST:  129m OMNIPAQUE IOHEXOL 350 MG/ML SOLN COMPARISON:  Chest CTA 10/09/2014. CTA of the chest, abdomen and pelvis 07/24/2013. FINDINGS: CTA CHEST FINDINGS Cardiovascular: Heart size is enlarged. There is no significant pericardial fluid, thickening or pericardial calcification. Status post median sternotomy for Bentall procedure with mechanical aortic  valve and ascending thoracic aortic graft. There is a small defect on the undersurface of the mechanical aortic valve best appreciated on coronal image 82 of series 8 measuring approximately 8 mm, which may represent valve associated thrombus and/or vegetation. The aortic arch is mildly aneurysmal measuring up to 4.3 cm in diameter. There is a residual dissection flap in the proximal and mid aortic arch which does not propagate into the great vessels. Descending thoracic aorta is normal in caliber measuring 2.8 cm in diameter. Bovine type thoracic aortic arch incidentally noted. There is a right coronary artery bypass graft. Large filling defect in the superior aspect of the right atrium immediately posterior to the septal leaflet of the tricuspid valve, and intimately associated with the adjacent aortic root, estimated to measure approximately 2.2 x 2.2 x 1.7 cm (axial image 83 of series 6 and coronal image 73 of series 8), presumably a vegetation. Mediastinum/Nodes: No pathologically enlarged mediastinal or hilar lymph nodes. Esophagus is unremarkable in appearance. No axillary lymphadenopathy. Lungs/Pleura: No suspicious pulmonary nodules or masses are noted. No acute consolidative airspace disease. No pleural effusions. Musculoskeletal: In the T10 vertebral body there is a large lucent lesion with extension of soft tissue into the right paravertebral region and involvement of the head of the right tenth rib overall estimated to measure approximately 4.9 x 3.4 x 2.0 cm (axial image 105 of series 6 and sagittal image 117 of series 9). This causes significant narrowing of the central spinal canal (approximately 5 mm AP) at this level. Median sternotomy wires. Review of the MIP images confirms the above findings. CTA ABDOMEN AND PELVIS FINDINGS VASCULAR Aorta: Mild aortic atherosclerosis. Normal caliber aorta without aneurysm, dissection, vasculitis  or significant stenosis. Celiac: Patent without evidence of  aneurysm, dissection, vasculitis or significant stenosis. SMA: Patent without evidence of aneurysm, dissection, vasculitis or significant stenosis. Renals: Both renal arteries are patent without evidence of aneurysm, dissection, vasculitis, fibromuscular dysplasia or significant stenosis. IMA: Patent without evidence of aneurysm, dissection, vasculitis or significant stenosis. Inflow: Patent without evidence of aneurysm, dissection, vasculitis or significant stenosis. Veins: No obvious venous abnormality within the limitations of this arterial phase study. Review of the MIP images confirms the above findings. NON-VASCULAR Hepatobiliary: No suspicious appearing cystic or solid hepatic lesions are confidently identified on today's arterial phase examination. No intra or extrahepatic biliary ductal dilatation. Several partially calcified gallstones are noted measuring up to 1.6 cm in diameter. Gallbladder is otherwise unremarkable in appearance. Pancreas: No pancreatic mass. No pancreatic ductal dilatation. No pancreatic or peripancreatic fluid collections or inflammatory changes. Spleen: Unremarkable. Adrenals/Urinary Tract: Bilateral kidneys and adrenal glands are normal in appearance. No hydroureteronephrosis. Urinary bladder is normal in appearance. Stomach/Bowel: Normal appearance of the stomach. No pathologic dilatation of small bowel or colon. The appendix is not confidently identified and may be surgically absent. Regardless, there are no inflammatory changes noted adjacent to the cecum to suggest the presence of an acute appendicitis at this time. Lymphatic: No lymphadenopathy noted in the abdomen or pelvis. Reproductive: Prostate gland and seminal vesicles are unremarkable in appearance. Other: No significant volume of ascites.  No pneumoperitoneum. Musculoskeletal: There are no aggressive appearing lytic or blastic lesions noted in the visualized portions of the skeleton. Review of the MIP images confirms  the above findings. IMPRESSION: 1. 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 2. Small lesions associated with the undersurface of the mechanical aortic valve and the superior aspect of the septal leaflet of the tricuspid valve, which may reflect valve associated thrombus and/or vegetations, as detailed above. 3. Aggressive appearing lesion in T10 vertebral body with extends in into the right paravertebral soft tissues and involvement of the head of the right tenth rib, with marked narrowing of the central spinal canal at this level, as detailed above. Primary differential considerations include a plasmacytoma, or less likely and osteoblastoma or solitary metastatic lesion (no primary lesion is confidently identified). If there is clinical concern for spinal cord compression, the full extent of this lesion could be better evaluated with thoracic spine MRI with and without IV gadolinium. 4. Bovine type thoracic aortic arch (normal anatomical variant) incidentally noted. 5. Cholelithiasis without evidence of acute cholecystitis at this time. 6. Additional incidental findings, as above. These results will be called to the ordering clinician or representative by the Radiologist Assistant, and communication documented in the PACS or [employer]. Electronically Signed   By: Vinnie Langton M.D.   On: 09/12/2019 13:02   ECHOCARDIOGRAM COMPLETE  Result Date: 09/09/2019    ECHOCARDIOGRAM REPORT   Patient Name:   [name] Date of Exam: 09/09/2019 Medical Rec #:  [id number]        Height:       74.0 in Accession #:    [id number]       Weight:       282.2 lb Date of Birth:  1966-03-21        BSA:          2.516 m Patient Age:    39 years         BP:  120/74 mmHg Patient Gender: M                HR:           77 bpm. Exam Location:  Inpatient  Procedure: 2D Echo, Cardiac Doppler and Color Doppler Indications:    Dyspnea 786.09  History:        Patient has prior history of Echocardiogram examinations, most                 recent 10/24/2017. CAD, Prior CABG, Signs/Symptoms:Shortness of                 Breath and Chest Pain; Risk Factors:Hypertension, Diabetes,                 Dyslipidemia and Non-Smoker. Bentall.                 Aortic Valve: mechanical valve is present in the aortic                 position. Procedure Date: 2000.  Sonographer:    Vickie Epley RDCS Referring Phys: [id number] [place]  1. Tricuspid valve vegetation. Discussed with referring provider.  2. Left ventricular ejection fraction, by estimation, is 55 to 60%. The left ventricle has normal function. The left ventricle has no regional wall motion abnormalities. Left ventricular diastolic parameters were normal.  3. Right ventricular systolic function is normal. The right ventricular size is mildly enlarged. There is normal pulmonary artery systolic pressure.  4. Left atrial size was moderately dilated.  5. Right atrial size was severely dilated.  6. The mitral valve is normal in structure. Mild mitral valve regurgitation. No evidence of mitral stenosis.  7. There is a 1.8 x 1.1 cm tricuspid valve mass on the atrial surface consistent with vegetation. . Tricuspid valve regurgitation is moderate.  8. Transaortic velocity is higher than expected for mechanical aortic valve. The aortic valve is normal in structure. Aortic valve regurgitation is not visualized. No aortic stenosis is present. There is a mechanical valve present in the aortic position. Procedure Date: 2000. Aortic valve mean gradient measures 33.5 mmHg. Aortic valve Vmax measures 3.68 m/s.  9. Aortic root/ascending aorta has been repaired/replaced and Bentall. 10. The inferior vena cava is normal in size with greater than 50% respiratory variability, suggesting right atrial pressure of 3 mmHg. FINDINGS  Left  Ventricle: Left ventricular ejection fraction, by estimation, is 55 to 60%. The left ventricle has normal function. The left ventricle has no regional wall motion abnormalities. The left ventricular internal cavity size was normal in size. There is  no left ventricular hypertrophy. Left ventricular diastolic parameters were normal. Right Ventricle: The right ventricular size is mildly enlarged. No increase in right ventricular wall thickness. Right ventricular systolic function is normal. There is normal pulmonary artery systolic pressure. The tricuspid regurgitant velocity is 2.51  m/s, and with an assumed right atrial pressure of 3 mmHg, the estimated right ventricular systolic pressure is 60.6 mmHg. Left Atrium: Left atrial size was moderately dilated. Right Atrium: Right atrial size was severely dilated. Pericardium: There is no evidence of pericardial effusion. Mitral Valve: The mitral valve is normal in structure. Normal mobility of the mitral valve leaflets. Mild mitral annular calcification. Mild mitral valve regurgitation. No evidence of mitral valve stenosis. Tricuspid Valve: There is a 1.8 x 1.1 cm tricuspid valve mass on the atrial surface consistent with vegetation. The tricuspid valve is normal in structure. Tricuspid valve regurgitation is moderate .  No evidence of tricuspid stenosis. Aortic Valve: Transaortic velocity is higher than expected for mechanical aortic valve. The aortic valve is normal in structure. Aortic valve regurgitation is not visualized. No aortic stenosis is present. Aortic valve mean gradient measures 33.5 mmHg. Aortic valve peak gradient measures 54.3 mmHg. Aortic valve area, by VTI measures 0.93 cm. There is a mechanical valve present in the aortic position. Procedure Date: 2000. Pulmonic Valve: The pulmonic valve was normal in structure. Pulmonic valve regurgitation is mild. No evidence of pulmonic stenosis. Aorta: The aortic root/ascending aorta has been repaired/replaced and  Bentall. Venous: The inferior vena cava is normal in size with greater than 50% respiratory variability, suggesting right atrial pressure of 3 mmHg. IAS/Shunts: No atrial level shunt detected by color flow Doppler.  LEFT VENTRICLE PLAX 2D LVIDd:         5.60 cm      Diastology LVIDs:         4.60 cm      LV e' lateral:   13.10 cm/s LV PW:         1.10 cm      LV E/e' lateral: 5.7 LV IVS:        1.10 cm      LV e' medial:    8.49 cm/s LVOT diam:     2.20 cm      LV E/e' medial:  8.8 LV SV:         73 LV SV Index:   29 LVOT Area:     3.80 cm  LV Volumes (MOD) LV vol d, MOD A2C: 163.0 ml LV vol d, MOD A4C: 157.0 ml LV vol s, MOD A2C: 88.8 ml LV vol s, MOD A4C: 72.8 ml LV SV MOD A2C:     74.2 ml LV SV MOD A4C:     157.0 ml LV SV MOD BP:      77.9 ml RIGHT VENTRICLE RV S prime:     9.25 cm/s TAPSE (M-mode): 1.8 cm LEFT ATRIUM             Index       RIGHT ATRIUM           Index LA diam:        5.80 cm 2.31 cm/m  RA Area:     37.80 cm LA Vol (A2C):   65.8 ml 26.15 ml/m RA Volume:   141.00 ml 56.04 ml/m LA Vol (A4C):   76.5 ml 30.41 ml/m LA Biplane Vol: 77.8 ml 30.92 ml/m  AORTIC VALVE AV Area (Vmax):    0.95 cm AV Area (Vmean):   0.88 cm AV Area (VTI):     0.93 cm AV Vmax:           368.50 cm/s AV Vmean:          273.000 cm/s AV VTI:            0.786 m AV Peak Grad:      54.3 mmHg AV Mean Grad:      33.5 mmHg LVOT Vmax:         92.00 cm/s LVOT Vmean:        63.300 cm/s LVOT VTI:          0.193 m LVOT/AV VTI ratio: 0.25  AORTA Ao Root diam: 2.90 cm MITRAL VALVE               TRICUSPID VALVE MV Area (PHT): 5.02 cm    TR Peak grad:   25.2  mmHg MV Decel Time: 151 msec    TR Vmax:        251.00 cm/s MR Peak grad: 107.3 mmHg MR Vmax:      518.00 cm/s  SHUNTS MV E velocity: 74.60 cm/s  Systemic VTI:  0.19 m MV A velocity: 37.70 cm/s  Systemic Diam: 2.20 cm MV E/A ratio:  1.98 Candee Furbish MD Electronically signed by Candee Furbish MD Signature Date/Time: 09/09/2019/12:06:06 PM    Final    ECHO TEE  Result Date:  09/11/2019    TRANSESOPHOGEAL ECHO REPORT   Patient Name:   [name] Date of Exam: 09/11/2019 Medical Rec #:  [id number]        Height:       74.0 in Accession #:    [id number]       Weight:       280.0 lb Date of Birth:  04/03/1966        BSA:          2.508 m Patient Age:    12 years         BP:           108/65 mmHg Patient Gender: M                HR:           106 bpm. Exam Location:  Inpatient Procedure: Transesophageal Echo, Cardiac Doppler and Color Doppler Indications:     Bacteremia.  History:         Patient has prior history of Echocardiogram examinations, most                  recent 09/09/2019. CAD, Aortic Valve Disease, Endocarditis,                  Tricuspid vegetation and Prosthetic Valve Complications,                  Signs/Symptoms:Altered Mental Status; Risk Factors:Diabetes,                  Hypertension and Dyslipidemia. Aortic valve replacement.                  Bentall procedure. Aortic dissection.                  Aortic Valve: unknown Medtronic tilting disk valve is present                  in the aortic position. Procedure Date: 2000.  Sonographer:     Roseanna Rainbow RDCS Referring Phys:  [id number] RHONDA G BARRETT Diagnosing Phys: Sanda Klein MD PROCEDURE: After discussion of the risks and benefits of a TEE, an informed consent was obtained from the patient. The transesophogeal probe was passed without difficulty through the esophogus of the patient. Imaged were obtained with the patient in a left lateral decubitus position. Sedation performed by different physician. The patient was monitored while under deep sedation. Anesthestetic sedation was provided intravenously by Anesthesiology: '230mg'$  of Propofol. The patient's vital signs; including heart rate, blood pressure, and oxygen saturation; remained stable throughout the procedure. The patient developed no complications during the procedure. IMPRESSIONS  1. Left ventricular ejection fraction, by estimation, is 55 to 60%. The left  ventricle has normal function. The left ventricle has no regional wall motion abnormalities. There is mild concentric left ventricular hypertrophy.  2. Right ventricular systolic function is normal. The right ventricular size is mildly enlarged. There is mildly elevated pulmonary  artery systolic pressure. The estimated right ventricular systolic pressure is 26.2 mmHg.  3. It appears that the left atrial appendage has been surgically resected or oversown. Left atrial size was severely dilated. No left atrial/left atrial appendage thrombus was detected. The LAA emptying velocity was 35 cm/s.  4. Right atrial size was severely dilated.  5. The mitral valve is normal in structure. Mild mitral valve regurgitation.  6. There is a large, moderately mobile vegetation attached to the septal leaflet of the tricuspid valve, measuring 27 mm in length and 11 mm in width. It appears heterogeneous and friable.. Tricuspid valve regurgitation is moderate.  7. There is a large vegetation attached to the medial aspect of posterior disc or the posteromedial mechanical valve annulus. It is highly mobile and prolapses readily across the valve orifice. It measures 15 mm in length and 6 mm in width. Although there is no evidence of a periannular abscess cavity, the periannular tissue echodensity is heterogeneous, raining concern for inflammation/early abscess formation. The proximity of the aortic and the tricuspid vegetation to the same area of the interventricular septum also raises concern for intramyocardial abscess or fistula formation. The aortic valve has been repaired/replaced. Aortic valve regurgitation is trivial. Mild aortic valve stenosis. There is a unknown Medtronic tilting disk valve present in the aortic position. Procedure Date: 2000. Aortic valve mean gradient measures 20.0 mmHg.  8. There is mild (Grade II) atheroma plaque involving the transverse aorta. Comparison(s): Compared to previous reports, there is evidence of  vegetations on the tricuspid valve and the mechanical aortic valve and suspicion for early perivalvular abscess. The dimensionless aortic vale index is similar to older studies. This suggests that the increased aortic valve gradients are due to increased cardiac output (rather than obstruction from the vegetation or compromised prosthetic disc motion). FINDINGS  Left Ventricle: Left ventricular ejection fraction, by estimation, is 55 to 60%. The left ventricle has normal function. The left ventricle has no regional wall motion abnormalities. The left ventricular internal cavity size was normal in size. There is  mild concentric left ventricular hypertrophy. Abnormal (paradoxical) septal motion consistent with post-operative status. Right Ventricle: The right ventricular size is mildly enlarged. No increase in right ventricular wall thickness. Right ventricular systolic function is normal. There is mildly elevated pulmonary artery systolic pressure. The tricuspid regurgitant velocity is 2.48 m/s, and with an assumed right atrial pressure of 8 mmHg, the estimated right ventricular systolic pressure is 03.5 mmHg. Left Atrium: It appears that the left atrial appendage has been surgically resected or oversown. Left atrial size was severely dilated. Spontaneous echo contrast was present in the left atrium. No left atrial/left atrial appendage thrombus was detected. The LAA emptying velocity was 35 cm/s. Right Atrium: Right atrial size was severely dilated. Prominent Eustachian valve. Pericardium: There is no evidence of pericardial effusion. Mitral Valve: The mitral valve is normal in structure. Mild mitral valve regurgitation, with centrally-directed jet. Tricuspid Valve: There is a large, moderately mobile vegetation attached to the septal leaflet of the tricuspid valve, measuring 27 mm in length and 11 mm in width. It appears heterogeneous and friable. The tricuspid valve is normal in structure. Tricuspid valve  regurgitation is moderate. Aortic Valve: There is a large vegetation attached to the medial aspect of posterior disc or the posteromedial mechanical valve annulus. It is highly mobile and prolapses readily across the valve orifice. It measures 15 mm in length and 6 mm in width. Although there is no evidence of a periannular abscess cavity,  the periannular tissue echodensity is heterogeneous, raining concern for inflammation/early abscess formation. The proximity of the aortic and the tricuspid vegetation to the same area of the  interventricular septum also raises concern for intramyocardial abscess or fistula formation. The aortic valve has been repaired/replaced. Aortic valve regurgitation is trivial. Mild aortic stenosis is present. Aortic valve mean gradient measures 20.0 mmHg. Aortic valve peak gradient measures 32.9 mmHg. There is a unknown Medtronic tilting disk valve present in the aortic position. Procedure Date: 2000. Pulmonic Valve: The pulmonic valve was normal in structure. Pulmonic valve regurgitation is trivial. Aorta: The aortic root, ascending aorta, aortic arch and descending aorta are all structurally normal, with no evidence of dilitation or obstruction. There is mild (Grade II) atheroma plaque involving the transverse aorta. IAS/Shunts: No atrial level shunt detected by color flow Doppler.  AORTIC VALVE AV Vmax:           287.00 cm/s AV Vmean:          208.000 cm/s AV VTI:            0.444 m AV Peak Grad:      32.9 mmHg AV Mean Grad:      20.0 mmHg LVOT Vmax:         126.00 cm/s LVOT Vmean:        90.500 cm/s LVOT VTI:          0.223 m LVOT/AV VTI ratio: 0.50 TRICUSPID VALVE TR Peak grad:   24.6 mmHg TR Vmax:        248.00 cm/s  SHUNTS Systemic VTI: 0.22 m Dani Gobble Croitoru MD Electronically signed by Sanda Klein MD Signature Date/Time: 09/11/2019/2:35:26 PM    Final    VAS US DOPPLER PRE CABG  Result Date: 09/12/2019 PREOPERATIVE VASCULAR EVALUATION  Indications:      Pre-CABG. Risk Factors:      Hypertension, hyperlipidemia, Diabetes. Comparison Study: No prior studies. Performing Technologist: Carlos Levering Rvt  Examination Guidelines: A complete evaluation includes B-mode imaging, spectral Doppler, color Doppler, and power Doppler as needed of all accessible portions of each vessel. Bilateral testing is considered an integral part of a complete examination. Limited examinations for reoccurring indications may be performed as noted.  Right Carotid Findings: +----------+--------+--------+--------+-----------------------+--------+           PSV cm/sEDV cm/sStenosisDescribe               Comments +----------+--------+--------+--------+-----------------------+--------+ CCA Prox  157     16              smooth and heterogenoustortuous +----------+--------+--------+--------+-----------------------+--------+ CCA Distal46      13              smooth and heterogenous         +----------+--------+--------+--------+-----------------------+--------+ ICA Prox  50      16              smooth and heterogenous         +----------+--------+--------+--------+-----------------------+--------+ ICA Distal42      18                                     tortuous +----------+--------+--------+--------+-----------------------+--------+ ECA       74      8                                               +----------+--------+--------+--------+-----------------------+--------+  Portions of this table do not appear on this page. +----------+--------+-------+--------+------------+           PSV cm/sEDV cmsDescribeArm Pressure +----------+--------+-------+--------+------------+ Subclavian81                     134          +----------+--------+-------+--------+------------+ +---------+--------+--+--------+--+---------+ VertebralPSV cm/s33EDV cm/s10Antegrade +---------+--------+--+--------+--+---------+ Left Carotid Findings:  +----------+--------+--------+--------+-----------------------+--------+           PSV cm/sEDV cm/sStenosisDescribe               Comments +----------+--------+--------+--------+-----------------------+--------+ CCA Prox  65      16              smooth and heterogenous         +----------+--------+--------+--------+-----------------------+--------+ CCA Distal62      9               smooth and heterogenous         +----------+--------+--------+--------+-----------------------+--------+ ICA Prox  40      17                                     tortuous +----------+--------+--------+--------+-----------------------+--------+ ICA Distal56      29                                     tortuous +----------+--------+--------+--------+-----------------------+--------+ ECA       45      6                                               +----------+--------+--------+--------+-----------------------+--------+ +----------+--------+--------+--------+------------+ SubclavianPSV cm/sEDV cm/sDescribeArm Pressure +----------+--------+--------+--------+------------+           71                      136          +----------+--------+--------+--------+------------+ +---------+--------+--+--------+-+---------+ VertebralPSV cm/s38EDV cm/s9Antegrade +---------+--------+--+--------+-+---------+  ABI Findings: +--------+------------------+-----+---------+--------+ Right   Rt Pressure (mmHg)IndexWaveform Comment  +--------+------------------+-----+---------+--------+ [id number]                    triphasic         +--------+------------------+-----+---------+--------+ PTA     147               1.08 triphasic         +--------+------------------+-----+---------+--------+ DP      176               1.29 triphasic         +--------+------------------+-----+---------+--------+ +--------+------------------+-----+---------+-------+ Left    Lt Pressure  (mmHg)IndexWaveform Comment +--------+------------------+-----+---------+-------+ [id number]                    triphasic        +--------+------------------+-----+---------+-------+ PTA     122               0.90 triphasic        +--------+------------------+-----+---------+-------+ DP      155               1.14 triphasic        +--------+------------------+-----+---------+-------+ +-------+---------------+----------------+ ABI/TBIToday's ABI/TBIPrevious ABI/TBI +-------+---------------+----------------+ Right  1.29                            +-------+---------------+----------------+  Left   1.14                            +-------+---------------+----------------+  Right Doppler Findings: +--------+--------+-----+---------+--------+ Site    PressureIndexDoppler  Comments +--------+--------+-----+---------+--------+ [id number]          triphasic         +--------+--------+-----+---------+--------+ Radial               triphasic         +--------+--------+-----+---------+--------+ Ulnar                triphasic         +--------+--------+-----+---------+--------+  Left Doppler Findings: +--------+--------+-----+---------+--------+ Site    PressureIndexDoppler  Comments +--------+--------+-----+---------+--------+ [id number]          triphasic         +--------+--------+-----+---------+--------+ Radial               triphasic         +--------+--------+-----+---------+--------+ Ulnar                triphasic         +--------+--------+-----+---------+--------+  Summary: Right Carotid: Velocities in the right ICA are consistent with a 1-39% stenosis. Left Carotid: Velocities in the left ICA are consistent with a 1-39% stenosis. Vertebrals: Bilateral vertebral arteries demonstrate antegrade flow. Right ABI: Resting right ankle-brachial index is within normal range. No evidence of significant right lower extremity arterial disease.  Left ABI: Resting left ankle-brachial index is within normal range. No evidence of significant left lower extremity arterial disease. Right Upper Extremity: Doppler waveforms decrease >50% with right radial compression. Doppler waveform obliterate with right ulnar compression. Left Upper Extremity: Doppler waveform obliterate with left radial compression. Doppler waveforms remain within normal limits with left ulnar compression.  Electronically signed by Monica Martinez MD on 09/12/2019 at 8:06:35 PM.    Final    [place] EKG SITE RITE  Result Date: 09/14/2019 If Site Rite image not attached, placement could not be confirmed due to current cardiac rhythm.  CT Angio Abd/Pel w/ and/or w/o  Result Date: 09/12/2019 CLINICAL DATA:  55 year old male under preoperative evaluation prior to potential redo Bentall procedure. EXAM: CT ANGIOGRAPHY CHEST, ABDOMEN AND PELVIS TECHNIQUE: Non-contrast CT of the chest was initially obtained. Multidetector CT imaging through the chest, abdomen and pelvis was performed using the standard protocol during bolus administration of intravenous contrast. Multiplanar reconstructed images and MIPs were obtained and reviewed to evaluate the vascular anatomy. CONTRAST:  140m OMNIPAQUE IOHEXOL 350 MG/ML SOLN COMPARISON:  Chest CTA 10/09/2014. CTA of the chest, abdomen and pelvis 07/24/2013. FINDINGS: CTA CHEST FINDINGS Cardiovascular: Heart size is enlarged. There is no significant pericardial fluid, thickening or pericardial calcification. 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 There is a small defect on the undersurface of the mechanical aortic valve best appreciated on coronal image 82 of series 8 measuring approximately 8 mm, which may represent valve associated thrombus and/or vegetation. The aortic arch is mildly aneurysmal measuring up to 4.3 cm in diameter. There is a residual dissection flap in the  proximal and mid aortic arch which does not propagate into the great vessels. Descending thoracic aorta is normal in caliber measuring 2.8 cm in diameter. Bovine type thoracic aortic arch incidentally noted. There is a right coronary artery bypass graft. Large filling defect in the superior aspect of the right atrium immediately posterior to the septal leaflet  of the tricuspid valve, and intimately associated with the adjacent aortic root, estimated to measure approximately 2.2 x 2.2 x 1.7 cm (axial image 83 of series 6 and coronal image 73 of series 8), presumably a vegetation. Mediastinum/Nodes: No pathologically enlarged mediastinal or hilar lymph nodes. Esophagus is unremarkable in appearance. No axillary lymphadenopathy. Lungs/Pleura: No suspicious pulmonary nodules or masses are noted. No acute consolidative airspace disease. No pleural effusions. Musculoskeletal: In the T10 vertebral body there is a large lucent lesion with extension of soft tissue into the right paravertebral region and involvement of the head of the right tenth rib overall estimated to measure approximately 4.9 x 3.4 x 2.0 cm (axial image 105 of series 6 and sagittal image 117 of series 9). This causes significant narrowing of the central spinal canal (approximately 5 mm AP) at this level. Median sternotomy wires. Review of the MIP images confirms the above findings. CTA ABDOMEN AND PELVIS FINDINGS VASCULAR Aorta: Mild aortic atherosclerosis. Normal caliber aorta without aneurysm, dissection, vasculitis or significant stenosis. Celiac: Patent without evidence of aneurysm, dissection, vasculitis or significant stenosis. SMA: Patent without evidence of aneurysm, dissection, vasculitis or significant stenosis. Renals: Both renal arteries are patent without evidence of aneurysm, dissection, vasculitis, fibromuscular dysplasia or significant stenosis. IMA: Patent without evidence of aneurysm, dissection, vasculitis or significant stenosis.  Inflow: Patent without evidence of aneurysm, dissection, vasculitis or significant stenosis. Veins: No obvious venous abnormality within the limitations of this arterial phase study. Review of the MIP images confirms the above findings. NON-VASCULAR Hepatobiliary: No suspicious appearing cystic or solid hepatic lesions are confidently identified on today's arterial phase examination. No intra or extrahepatic biliary ductal dilatation. Several partially calcified gallstones are noted measuring up to 1.6 cm in diameter. Gallbladder is otherwise unremarkable in appearance. Pancreas: No pancreatic mass. No pancreatic ductal dilatation. No pancreatic or peripancreatic fluid collections or inflammatory changes. Spleen: Unremarkable. Adrenals/Urinary Tract: Bilateral kidneys and adrenal glands are normal in appearance. No hydroureteronephrosis. Urinary bladder is normal in appearance. Stomach/Bowel: Normal appearance of the stomach. No pathologic dilatation of small bowel or colon. The appendix is not confidently identified and may be surgically absent. Regardless, there are no inflammatory changes noted adjacent to the cecum to suggest the presence of an acute appendicitis at this time. Lymphatic: No lymphadenopathy noted in the abdomen or pelvis. Reproductive: Prostate gland and seminal vesicles are unremarkable in appearance. Other: No significant volume of ascites.  No pneumoperitoneum. Musculoskeletal: There are no aggressive appearing lytic or blastic lesions noted in the visualized portions of the skeleton. Review of the MIP images confirms the above findings. IMPRESSION: 1. 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 2. Small lesions associated with the undersurface of the mechanical aortic valve and the superior aspect of the septal leaflet of the  tricuspid valve, which may reflect valve associated thrombus and/or vegetations, as detailed above. 3. Aggressive appearing lesion in T10 vertebral body with extends in into the right paravertebral soft tissues and involvement of the head of the right tenth rib, with marked narrowing of the central spinal canal at this level, as detailed above. Primary differential considerations include a plasmacytoma, or less likely and osteoblastoma or solitary metastatic lesion (no primary lesion is confidently identified). If there is clinical concern for spinal cord compression, the full extent of this lesion could be better evaluated with thoracic spine MRI with and without IV gadolinium.  4. Bovine type thoracic aortic arch (normal anatomical variant) incidentally noted. 5. Cholelithiasis without evidence of acute cholecystitis at this time. 6. Additional incidental findings, as above. These results will be called to the ordering clinician or representative by the Radiologist Assistant, and communication documented in the PACS or [employer]. Electronically Signed   By: Vinnie Langton M.D.   On: 09/12/2019 13:02    Pathology:  SURGICAL PATHOLOGY  CASE: [id number]  PATIENT: [name]  Surgical Pathology Report   Clinical History: (ms)   FINAL MICROSCOPIC DIAGNOSIS:   A. BONE, T10 CORE, BIOPSY:  -Plasma cell neoplasm  -See comment   COMMENT:   The sections show fragments of blood clot containing a few small  fragments of soft tissue displaying sheets plasma cells. Normal  hematopoietic elements are not seen. Admixed are small fragments of  bone. To further evaluate this process, immunohistochemical stains for  CD3, CD20, CD43, LCA, CD56, CD79a, CD138 in addition to in situ  hybridization were for kappa and lambda light chains were performed with  appropriate controls. The plasma cells are positive for CD138 and  cytoplasmic kappa in addition to partial staining for CD43, CD79a,  CD56  and LCA. No significant staining is seen with CD20, CD3 or cytoplasmic  lambda. 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  Hematologic evaluation is recommended. The results were discussed with  Dr. Pedro Earls on 09/23/19.    GROSS DESCRIPTION:   Received in formalin is a 1.6 x 1.3 x 0.25 cm aggregate of dark red soft  tissue/material, entirely submitted 1 block.   SW 09/19/2019   Final Diagnosis performed by Susanne Greenhouse, MD.  Electronically signed  09/23/2019   Assessment and Plan:  This is a pleasant 54 year old African-American male noted to have an incidental finding of an aggressive appearing lesion in the T10 vertebral body as part of his preoperative work-up.  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  Biopsy results were discussed today with the patient and his wife.  Recommend additional work-up including SPEP with IFE and quantitative immunoglobulins, light chains, LDH, beta-2 microglobulin, uric acid, 24-hour urine for UPEP/UIFE/light chains/total protein, skeletal bone survey, and bone marrow biopsy.  We will attempt to expedite this work-up as quickly as possible, but the patient is scheduled to have surgery on 09/26/2019 and we may need to delay some this work-up until after he has recovered from his surgery.  Once the have completed his work-up, we will discuss treatment options with the patient.  Treatment for his plasma cell neoplasm will begin as an outpatient.  Of note, IR has indicated that he would likely benefit from [facility] ablation of the T10 lesion and this can be considered as an outpatient. Additionally, I spoke with Dr. Enrique Sack of dental medicine who has indicated that he needs additional dental work.  Therefore, the patient will need dental clearance before bisphosphonate therapy can be considered in the future.  Thank you for this referral.   Mikey Bussing, DNP, AGPCNP-BC,  AOCNP  ADDENDUM: Hematology/Oncology Attending: I had a face-to-face encounter with the patient today.  I recommended his care plan and agree with the above note.  This is a very pleasant 53 years old African-American male with multiple medical problems with admitted to the hospital with nausea vomiting and currently being treated for endocarditis.  The patient had preoperative imaging studies including CT scan of the chest, abdomen pelvis that showed aggressive appearing lytic lesion involving the T10 vertebral  body.  He underwent CT-guided biopsy of the suspicious lesion and it is consistent with plasma cell neoplasm. I had a lengthy discussion with the patient and his wife who was at the bedside about his current condition and further investigation to rule out multiple myeloma as well as potential treatment options. I recommended for the patient to have whole body skeletal bone survey.  We will also arrange for the patient to have a bone marrow biopsy and aspirate by interventional radiology. We will order myeloma panel. If the work-up showed no other concerning lesion except the plasmacytoma at T10, he may benefit from treatment with palliative radiotherapy otherwise the patient may require systemic treatment followed by consideration for stem cell transplant. I will arrange for the patient a follow-up appointment with me at the [place] after discharge for more detailed discussion of his treatment options based on the pending lab and imaging studies. Thank you so much for allowing me to participate in the care of Mr. Brandenburg, I will continue to follow up the patient with you and assist in his management on as-needed basis.  Disclaimer: This note was dictated with voice recognition software. Similar sounding words can inadvertently be transcribed and may be missed upon review. Eilleen Kempf, MD

## 2019-09-24 NOTE — Progress Notes (Signed)
Pharmacy Antibiotic Note  [name] is a 54 y.o. 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   Pharmacy has been consulted for gentamicin dosing for synergy.  Pt has been on ceftriaxone and gent for his native and prosthetic valve endocarditis with strep infantarius. Gentamicin was adjusted on 4/29. Scr remains stable at 1.30, afebrile. Plan for CABG + AVR on 5/6.  Patient will need 6 weeks of ceftriaxone + gentamicin with  I susceptibility to penicillin of strep infantarius.   Note: Gentamicin never received by nursing staff this morning so re-timed dose today for 1200.   Plan: Continue gentamicin at 220mg  IV q24 Continue ceftriaxone 2 gm every 24 hours Monitor surgical plans, renal function and clinical status  Monitor gentamicin trough at steady state (Goal <1)  Will order gent trough for Thursday/Friday   Height: 6\' 2"  (188 cm) Weight: 130.6 kg (287 lb 14.4 oz) IBW/kg (Calculated) : 82.2  Temp (24hrs), Avg:99 F (37.2 C), Min:98.2 F (36.8 C), Max:99.5 F (37.5 C)  Recent Labs  Lab 09/18/19 1851 09/19/19 0320 09/20/19 0351 09/21/19 0308 09/22/19 0541 09/23/19 0458 09/24/19 0427  WBC  --    < > 7.7 7.5 6.9 8.1 6.4  CREATININE  --    < > 1.28* 1.34* 1.38* 1.30* 1.30*  GENTTROUGH 1.9  --   --   --   --   --   --    < > = values in this interval not displayed.    Estimated Creatinine Clearance: 93.4 mL/min (A) (by C-G formula based on SCr of 1.3 mg/dL (H)).    Allergies  Allergen Reactions  . Diltiazem Hives  . Insulin Glargine Swelling    edema    Antimicrobials this admission: 4/19 Vanc>> 4/21 4/19 ceftriaxone >> 4/21 gentamicin>>  Microbiology results: 4/19 BCx: GPCs in clusters 2/2>>STREPTOCOCCUS INFANTARIUS  4/20: bld x2 - ngtdF  Jimmy Footman, PharmD, BCPS, [place] [facility] Clinical Pharmacist Phone: [phone] 09/24/2019 11:10 AM  Please check AMION  for all [place] phone numbers After 10:00 PM, call [place] [phone]

## 2019-09-24 NOTE — Consult Note (Signed)
Chief Complaint: Patient was seen in consultation today for plasma cell neoplasm/bone marrow aspiration and biopsy.  Referring Physician(s): Maryanna Shape  Supervising Physician: Daryll Brod  Patient Status: [facility] - In-pt  History of Present Illness: [name] is a 54 y.o. male with a past medical history of hypertension, hyperlipidemia, HF, CAD, aortic dissection 2000 s/p repair and aortic valve replacement, paroxysmal atrial fibrillation, TIA 2004, CVA, nephrolithiasis, diabetes mellitus, and obesity. He has been admitted to [facility] since 09/08/2019 for a variety of complaints, including dyspnea, N/V, and diarrhea. Hospital course complicated by infective endocarditis, back pain secondary to T10 lytic lesion, multiple teeth extractions, and plans for redo sternotomy and aortic root replacement tentatively for 09/26/2019. T10 lytic lesion was biopsied in IR 09/18/2019 revealing plasma cell neoplasm. Oncology was consulted who recommends IR consultation for possible bone marrow biopsy/aspiration as part of plasma cell neoplasm work-up.  IR consulted by Mikey Bussing, NP for possible image-guided bone marrow biopsy/aspiration. Patient awake and alert sitting in bed with no complaints at this time. Accompanied by wife at bedside. Denies fever, chills, chest pain, dyspnea, abdominal pain, or headache.  Currently on IV Heparin gtt.   Past Medical History:  Diagnosis Date   Ascending aortic dissection ([place])    a. 2000 - with aortic valve involvement. S/p repair of aortic dissection with placement of mechanical AVR.   CAD (coronary artery disease)    a. 2000 VG->PDA @ time of Ao dissection repair;  b. Cardiac CT 06/2010: no CAD, only mild plaque in prox RCA;  c. 2015 Cath: LM nl, LAD nl, LCX nl, RCA 100, VG->PDA 90 (4.5x12 Rebel BMS). d. 10/23/17 instent restenosis SVG to RCA, DES placed.   Cholelithiasis 08/22/2013   Chronic diastolic CHF (congestive heart failure) ([place])    a. 03/2016  Echo: EF 55-60%, no rwma, Gr2 DD.   Diabetes mellitus    Dyspnea    Gross hematuria    H/O mechanical aortic valve replacement    a. 03/2016 Echo: AoV mean gradient 50mHg, Valve Area (VTI) 1.36 cm^2, (Vmax) 1.22 cm^2, mod dil LA.   Hyperlipidemia    Hypertension    NEPHROLITHIASIS, HX OF    Obesity    PAF (paroxysmal atrial fibrillation) ([place]    a. H/o such, with recurrence of coarse afib/flutter during ER consult 03/2012.   Stroke ([place]    TRANSIENT ISCHEMIC ATTACK, HX OF    a. In 2004.   Unspecified vitamin D deficiency     Past Surgical History:  Procedure Laterality Date   AORTIC VALVE REPLACEMENT  2000   APPENDECTOMY  1998   CORONARY ARTERY BYPASS GRAFT  2000   CORONARY STENT INTERVENTION N/A 10/23/2017   Procedure: CORONARY STENT INTERVENTION;  Surgeon: VJettie Booze MD;  [facility];  Service: Cardiovascular;  Laterality: N/A;   IR FLUORO GUIDED NEEDLE PLC ASPIRATION/INJECTION LOC  09/18/2019   LEFT AND RIGHT HEART CATHETERIZATION WITH CORONARY ANGIOGRAM N/A 07/19/2013   Procedure: LEFT AND RIGHT HEART CATHETERIZATION WITH CORONARY ANGIOGRAM;  Surgeon: JJettie Booze MD;  [facility];  Service: Cardiovascular;  Laterality: N/A;   LUMBAR LAMINECTOMY/DECOMPRESSION MICRODISCECTOMY Right 06/06/2018   Procedure: Right L3-4 disectomy;  Surgeon: BMelina Schools MD;  [place]  Service: Orthopedics;  Laterality: Right;  2.5 hrs   MULTIPLE EXTRACTIONS WITH ALVEOLOPLASTY Bilateral 09/17/2019   Procedure: EXTRACTION OF TOOTH #'S 14,94,49,67,59WITH ALVEOLOPLASTY AND GROSS DEBRIDEMENT OF REMAINING TEETH;  Surgeon: KLenn Cal DDS;  [facility];  Service: Oral Surgery;  Laterality: Bilateral;   RIGHT HEART CATH AND CORONARY/GRAFT ANGIOGRAPHY N/A 10/23/2017   Procedure: RIGHT HEART CATH AND CORONARY/GRAFT ANGIOGRAPHY;  Surgeon: [name], MD;  Location: [place] [facility] LAB;  Service: Cardiovascular;   Laterality: N/A;   RIGHT HEART CATH AND CORONARY/GRAFT ANGIOGRAPHY N/A 09/16/2019   Procedure: RIGHT HEART CATH AND CORONARY/GRAFT ANGIOGRAPHY;  Surgeon: Martinique, Peter M, MD;  Location: [place] [facility] LAB;  Service: Cardiovascular;  Laterality: N/A;   TEE WITHOUT CARDIOVERSION N/A 07/22/2013   Procedure: TRANSESOPHAGEAL ECHOCARDIOGRAM (TEE);  Surgeon: [name], MD;  Location: [facility] ENDOSCOPY;  Service: Cardiovascular;  Laterality: N/A;   TEE WITHOUT CARDIOVERSION N/A 09/11/2019   Procedure: TRANSESOPHAGEAL ECHOCARDIOGRAM (TEE);  Surgeon: Sanda Klein, MD;  Location: [facility] ENDOSCOPY;  Service: Cardiovascular;  Laterality: N/A;    Allergies: Diltiazem and Insulin glargine  Medications: Prior to Admission medications   Medication Sig Start Date End Date Taking? Authorizing Provider  aspirin EC 81 MG tablet Take 162 mg by mouth daily as needed (headache).   Yes [provider]  atorvastatin (LIPITOR) 40 MG tablet TAKE 1 TABLET EVERY DAY  AT  6  PM Patient taking differently: Take 40 mg by mouth daily with breakfast.  08/26/19  Yes Biagio Borg, MD  cholecalciferol (VITAMIN D) 1000 UNITS tablet Take 1,000 Units by mouth daily with breakfast.    Yes [provider]  furosemide (LASIX) 40 MG tablet Take 1 tablet (40 mg total) by mouth 2 (two) times daily. Patient taking differently: Take 40 mg by mouth 2 (two) times daily with a meal.  09/05/19  Yes Crenshaw, Denice Bors, MD  gabapentin (NEURONTIN) 300 MG capsule Take 1 capsule (300 mg total) by mouth 3 (three) times daily. Patient taking differently: Take 300 mg by mouth daily with breakfast.  01/09/19  Yes Jaynee Eagles, PA-C  glimepiride (AMARYL) 1 MG tablet TAKE 3 TABLETS ONE TIME DAILY WITH BREAKFAST Patient taking differently: Take 1 mg by mouth daily with breakfast.  03/14/17  Yes Biagio Borg, MD  levothyroxine (SYNTHROID) 50 MCG tablet TAKE 1 TABLET EVERY DAY Patient taking differently: Take 50 mcg by mouth daily with  breakfast.  06/07/19  Yes Biagio Borg, MD  losartan (COZAAR) 100 MG tablet Take 1 tablet (100 mg total) by mouth daily. Patient taking differently: Take 100 mg by mouth daily with breakfast.  08/21/19  Yes Biagio Borg, MD  metFORMIN (GLUCOPHAGE) 1000 MG tablet Take 1 tablet (1,000 mg total) by mouth 2 (two) times daily with a meal. 09/28/18  Yes Biagio Borg, MD  metoprolol (TOPROL-XL) 200 MG 24 hr tablet Take 1 tablet (200 mg total) by mouth daily. Take with or immediately following a meal. Patient taking differently: Take 200 mg by mouth daily with breakfast. Take with or immediately following a meal. 09/05/19  Yes Kilroy, Luke K, PA-C  nitroGLYCERIN (NITROSTAT) 0.4 MG SL tablet Place 1 tablet (0.4 mg total) under the tongue every 5 (five) minutes x 3 doses as needed for chest pain. 07/25/13  Yes Kilroy, Luke K, PA-C  potassium chloride SA (KLOR-CON) 20 MEQ tablet Take 20 mEq by mouth 2 (two) times daily with a meal.   Yes [provider]  Vitamin D, Ergocalciferol, (DRISDOL) 1.25 MG (50000 UT) CAPS capsule Take 1 capsule (50,000 Units total) by mouth every 7 (seven) days. Patient taking differently: Take 50,000 Units by mouth every Tuesday.  04/02/19  Yes Biagio Borg, MD  warfarin (  COUMADIN) 5 MG tablet TAKE 1 TO 1 AND 1/2 TABLETS EVERY DAY AS DIRECTED BY COUMADIN CLINIC Patient taking differently: Take 5-7.5 mg by mouth See admin instructions. Take 1 tablet (5 mg) by mouth on Monday, Wednesday, Friday with supper, take 1 1/2 tablets (7.5 mg) on Sunday, Tuesday, Thursday, Saturday with supper - or as directed by coumadin clinic 08/28/19  Yes Crenshaw, Denice Bors, MD  amLODipine (NORVASC) 10 MG tablet Take 1 tablet (10 mg total) by mouth daily. 09/05/19   Erlene Quan, PA-C  Blood Glucose Monitoring Suppl (TRUE METRIX AIR GLUCOSE METER) DEVI 1 Device by Does not apply route daily. E11.9 08/26/19   Biagio Borg, MD  clopidogrel (PLAVIX) 75 MG tablet Take 1 tablet (75 mg total) by mouth daily.  09/05/19   Erlene Quan, PA-C  glucose blood (TRUE METRIX BLOOD GLUCOSE TEST) test strip Use as directed once daily 08/26/19   Biagio Borg, MD  enoxaparin (LOVENOX) 120 MG/0.8ML injection Inject 0.8 mLs (120 mg total) into the skin every 12 (twelve) hours. 06/19/18 01/09/19  Lelon Perla, MD     Family History  Problem Relation Age of Onset   Coronary artery disease Mother    Hypertension Mother    Diabetes Brother    Coronary artery disease Other 50       male 1st degree relative, CABG in his 68's (father)   Cancer Father        ? lung    Social History   Socioeconomic History   Marital status: Married    Spouse name: Not on file   Number of children: 4   Years of education: Not on file   Highest education level: Not on file  Occupational History   Occupation: DISABLED    Employer: UNEMPLOYED  Tobacco Use   Smoking status: Never Smoker   Smokeless tobacco: Never Used  Substance and Sexual Activity   Alcohol use: No    Alcohol/week: 0.0 standard drinks   Drug use: No   Sexual activity: Not on file  Other Topics Concern   Not on file  Social History Narrative   Not on file   Social Determinants of Health   Financial Resource Strain:    Difficulty of Paying Living Expenses:   Food Insecurity:    Worried About Charity fundraiser in the Last Year:    Arboriculturist in the Last Year:   Transportation Needs:    Film/video editor (Medical):    Lack of Transportation (Non-Medical):   Physical Activity:    Days of Exercise per Week:    Minutes of Exercise per Session:   Stress:    Feeling of Stress :   Social Connections:    Frequency of Communication with Friends and Family:    Frequency of Social Gatherings with Friends and Family:    Attends Religious Services:    Active Member of Clubs or Organizations:    Attends Archivist Meetings:    Marital Status:      Review of Systems: A 12 point ROS discussed and  pertinent positives are indicated in the HPI above.  All other systems are negative.  Review of Systems  Constitutional: Negative for chills and fever.  Respiratory: Negative for shortness of breath and wheezing.   Cardiovascular: Negative for chest pain and palpitations.  Gastrointestinal: Negative for abdominal pain.  Neurological: Negative for headaches.  Psychiatric/Behavioral: Negative for behavioral problems and confusion.    Vital  Signs: BP 130/88 (BP Location: Left Arm)    Pulse 96    Temp 99.4 F (37.4 C) (Oral)    Resp 16    Ht '6\' 2"'$  (1.88 m)    Wt 287 lb 14.4 oz (130.6 kg)    SpO2 97%    BMI 36.96 kg/m   Physical Exam Vitals and nursing note reviewed.  Constitutional:      General: He is not in acute distress.    Appearance: Normal appearance.  Cardiovascular:     Rate and Rhythm: Normal rate and regular rhythm.     Heart sounds: Normal heart sounds. No murmur.  Pulmonary:     Effort: Pulmonary effort is normal. No respiratory distress.     Breath sounds: Normal breath sounds. No wheezing.  Skin:    General: Skin is warm and dry.  Neurological:     Mental Status: He is alert and oriented to person, place, and time.      MD Evaluation Airway: WNL Heart: WNL Heart  comments: Mechanical valve clicks, systolic murmur Abdomen: WNL Chest/ Lungs: WNL ASA  Classification: 3 Mallampati/Airway Score: Two   Imaging: DG Orthopantogram  Result Date: 09/11/2019 CLINICAL DATA:  Pre-op testing. Molar pain. EXAM: ORTHOPANTOGRAM/PANORAMIC COMPARISON:  None. FINDINGS: Several teeth are absent. There is typical midline artifact of the Panorex technique. Significant dental caries involving the right maxillary wisdom tooth and the most posterior remaining left mandibular molar. That tooth also demonstrates significant periodontal disease. No evidence of acute fracture or dislocation. IMPRESSION: Dental caries and periodontal disease as described. Electronically Signed   By:  Richardean Sale M.D.   On: 09/11/2019 18:04   DG Chest 2 View  Result Date: 09/08/2019 CLINICAL DATA:  Dyspnea, nausea, vomiting EXAM: CHEST - 2 VIEW COMPARISON:  10/21/2017 chest radiograph. FINDINGS: Intact sternotomy wires. Aortic valve prosthesis is in place. Stable cardiomediastinal silhouette with mild cardiomegaly. No pneumothorax. No pleural effusion. Lungs appear clear, with no acute consolidative airspace disease and no pulmonary edema. IMPRESSION: Stable mild cardiomegaly without pulmonary edema. No active pulmonary disease. Electronically Signed   By: Ilona Sorrel M.D.   On: 09/08/2019 11:11   CT CHEST W CONTRAST  Addendum Date: 09/21/2019   ADDENDUM REPORT: 09/21/2019 18:33 ADDENDUM: In the soft tissues posterior to the T10 vertebral body there is a small 3.4 x 1.5 cm collection in the subcutaneous fat. This is consistent with a small post biopsy hematoma. There is no evidence for active extravasation. Electronically Signed   By: Constance Holster M.D.   On: 09/21/2019 18:33   Result Date: 09/21/2019 CLINICAL DATA:  Chest pain. Shortness of breath. Concern for pleural effusion. EXAM: CT CHEST WITH CONTRAST TECHNIQUE: Multidetector CT imaging of the chest was performed during intravenous contrast administration. CONTRAST:  35m OMNIPAQUE IOHEXOL 300 MG/ML  SOLN COMPARISON:  09/10/2019 FINDINGS: Cardiovascular: The patient is again status Bentall procedure. A chronic dissection is again noted involving the aortic arch. The heart size is stable but enlarged. The main pulmonary artery is dilated but stable from prior study. Aortic calcifications are noted. Atherosclerotic changes are noted of the thoracic aorta. There is no pericardial effusion. Mediastinum/Nodes: --No mediastinal or hilar lymphadenopathy. --No axillary lymphadenopathy. --No supraclavicular lymphadenopathy. --Normal thyroid gland. --The esophagus is unremarkable Lungs/Pleura: No pulmonary nodules or masses. No pleural effusion or  pneumothorax. No focal airspace consolidation. No focal pleural abnormality. Upper Abdomen: There is cholelithiasis. Musculoskeletal: Again noted is a lytic lesion involving the T10 vertebral body. This lesion extends into the  posterior tenth rib on the right. Subtle additional lytic lesions are noted in various osseous structures including the anterior fifth rib on the left. Review of the MIP images confirms the above findings. IMPRESSION: 1. No acute abnormality. 2. Again noted is an aggressive appearing lytic lesion in the T10 vertebral body. Additional smaller subtle lytic lesions are noted involving the ribs, especially the anterior left fifth rib. 3. Cholelithiasis. Aortic Atherosclerosis (ICD10-I70.0). Electronically Signed: By: Constance Holster M.D. On: 09/21/2019 17:10   MR THORACIC SPINE W WO CONTRAST  Result Date: 09/12/2019 CLINICAL DATA:  Bone lesion EXAM: MRI THORACIC WITHOUT AND WITH CONTRAST TECHNIQUE: Multiplanar and multiecho pulse sequences of the thoracic spine were obtained without and with intravenous contrast. CONTRAST:  75m GADAVIST GADOBUTROL 1 MMOL/ML IV SOLN COMPARISON:  CTA chest/abdomen/pelvis 09/12/2019 FINDINGS: MRI THORACIC SPINE FINDINGS The examination is intermittently motion degraded. No scout localizer imaging is provided. Spinal numbering is ascertained by numbering caudally from the C2 level on same-day CTA chest/abdomen/pelvis. Alignment: No significant spondylolisthesis. Vertebrae: There is mild loss of T10 vertebral body height. Vertebral body height is otherwise maintained. There is prominent STIR hyperintensity and enhancement within the central and right aspect of the right T10 vertebral body also extending partly into the right T10 pedicle and transverse process. The mass extends into the right paravertebral soft tissues, with this component measuring 2.1 x 1.7 x 2.6 cm (AP x TV x CC) (series 15, image 54) (series 7, image 2). As demonstrated on CT performed  earlier the same day, there is involvement of the head of the right tenth rib. The T10 lesion is slightly expansile and there is a convex posterior vertebral margin. There are multiple additional subcentimeter foci of enhancement within the thoracic spine which are indeterminate. These are present within the T7, T8 and T12 vertebrae. Cord: No spinal cord signal abnormality or abnormal cord enhancement. No abnormal enhancement is identified within the spinal canal. Paraspinal and other soft tissues: Right paraspinal extension of a T10 vertebral body mass as described above. The paraspinal soft tissues are otherwise unremarkable. Disc levels: No more than mild disc degeneration at any level. At the T10 level, bony expansion and facet hypertrophy contribute to severe spinal canal stenosis with near complete effacement of the thecal sac. There may be mild cord impingement. Bony expansion and facet arthrosis also contributes to moderate/severe right neural foraminal narrowing at T10-T11. At the remaining thoracic levels, there is no focal disc herniation. There is multilevel facet arthrosis and ligamentum flavum hypertrophy which is greatest in the lower thoracic spine. No more than mild spinal canal narrowing at these remaining levels. No significant foraminal narrowing at the remaining levels. IMPRESSION: Again demonstrated is an aggressive appearing mass centered within the T10 vertebral body also extending into the right T10 pedicle and articular pillar. There is right paraspinal extension of this mass with this component measuring 2.6 cm and involving the head of the right tenth rib. There is mild loss of T10 vertebral body height. Primary differential considerations include plasmacytoma, metastatic disease, osteoblastoma or other aggressive primary bone lesion. Consider direct tissue sampling. Posterior bony expansion and facet hypertrophy contribute to severe spinal canal stenosis at the T10 level with near  complete effacement of the thecal sac. Moderate/severe right T10-T11 neural foraminal narrowing. There are subcentimeter foci of enhancement within the T7, T8 and T12 vertebrae which are indeterminate. Electronically Signed   By: KKellie SimmeringDO   On: 09/12/2019 20:42   CARDIAC CATHETERIZATION  Result Date: 09/16/2019  Ost RCA to Prox RCA lesion is 100% stenosed.  SVG to RVA with Origin lesion is 80% stenosed in stent.  Hemodynamic findings consistent with mild pulmonary hypertension.  1. Single vessel occlusive CAD with CTO of the ostium of the RCA 2. SVG to RCA with 80% in stent stenosis at the ostium. The RCA is a large vessel. 3. Mildly elevated LV filling pressures 4. Mild pulmonary HTN 5. Normal cardiac output. Plan: Redo AVR and CABG to RCA.   IR Fluoro Guide Ndl Plmt / BX  Result Date: 09/19/2019 INDICATION: 54 year-old-male with a T10 lytic lesion involving the T10 vertebral body extending to the right pedicle. EXAM: Fluoroscopy guided fine needle aspiration and core bone biopsy of T10 vertebral body. MEDICATIONS: None. ANESTHESIA/SEDATION: Moderate (conscious) sedation was employed during this procedure. A total of Versed 3 mg and Fentanyl 150 mcg was administered intravenously. Moderate Sedation Time: 54 minutes. The patient's level of consciousness and vital signs were monitored continuously by radiology nursing throughout the procedure under my direct supervision. FLUOROSCOPY TIME:  Fluoroscopy Time: 8 minutes 48 seconds (1554 mGy). COMPLICATIONS: None immediate. PROCEDURE: Informed written consent was obtained from the patient after a thorough discussion of the procedural risks, benefits and alternatives. All questions were addressed. Maximal Sterile Barrier Technique was utilized including caps, mask, sterile gowns, sterile gloves, sterile drape, hand hygiene and skin antiseptic. A timeout was performed prior to the initiation of the procedure. The patient was placed in prone position on  the angiography table. The thoracic spine region was prepped and draped in a sterile fashion. Under fluoroscopy, the T10 vertebral body was delineated and the skin area was marked. The skin was infiltrated with a 1% Lidocaine approximately 3 cm lateral to the spinous process projection on the right. Using a 22-gauge spinal needle, the soft issue and the peripedicular space and periosteum were infiltrated with Bupivacaine 0.5%. A skin incision was made at the access site. Subsequently, an 11-gauge Kyphon trocar was inserted under fluoroscopic guidance until contact with the pedicle was obtained. The trocar was inserted under light hammer tapping into the pedicle until the posterior boundaries of the vertebral body was reached. The diamond mandrill was removed and 3 final needle aspirations and one core biopsy wereobtained. The access sites were cleaned and covered with a sterile bandage. IMPRESSION: Fluoroscopy-guided T10 vertebral body a fine-needle aspiration core bone biopsy . Bone samples obtained were sent for pathology analysis. Electronically Signed   By: Pedro Earls M.D.   On: 09/19/2019 16:27   DG Foot Complete Left  Result Date: 09/15/2019 CLINICAL DATA:  Gout. EXAM: LEFT FOOT - COMPLETE 3+ VIEW COMPARISON:  None. FINDINGS: There is no evidence of fracture or dislocation. Very mild degenerative changes are seen involving the metatarsophalangeal joint of the left great toe. Soft tissues are unremarkable. IMPRESSION: Very mild degenerative changes without evidence of acute osseous abnormality. Electronically Signed   By: Virgina Norfolk M.D.   On: 09/15/2019 21:30   CT ANGIO CHEST AORTA W/CM & OR WO/CM  Result Date: 09/12/2019 CLINICAL DATA:  54 year old male under preoperative evaluation prior to potential redo Bentall procedure. EXAM: CT ANGIOGRAPHY CHEST, ABDOMEN AND PELVIS TECHNIQUE: Non-contrast CT of the chest was initially obtained. Multidetector CT imaging through the  chest, abdomen and pelvis was performed using the standard protocol during bolus administration of intravenous contrast. Multiplanar reconstructed images and MIPs were obtained and reviewed to evaluate the vascular anatomy. CONTRAST:  158m OMNIPAQUE IOHEXOL 350 MG/ML SOLN COMPARISON:  Chest  CTA 10/09/2014. CTA of the chest, abdomen and pelvis 07/24/2013. FINDINGS: CTA CHEST FINDINGS Cardiovascular: Heart size is enlarged. There is no significant pericardial fluid, thickening or pericardial calcification. 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 There is a small defect on the undersurface of the mechanical aortic valve best appreciated on coronal image 82 of series 8 measuring approximately 8 mm, which may represent valve associated thrombus and/or vegetation. The aortic arch is mildly aneurysmal measuring up to 4.3 cm in diameter. There is a residual dissection flap in the proximal and mid aortic arch which does not propagate into the great vessels. Descending thoracic aorta is normal in caliber measuring 2.8 cm in diameter. Bovine type thoracic aortic arch incidentally noted. There is a right coronary artery bypass graft. Large filling defect in the superior aspect of the right atrium immediately posterior to the septal leaflet of the tricuspid valve, and intimately associated with the adjacent aortic root, estimated to measure approximately 2.2 x 2.2 x 1.7 cm (axial image 83 of series 6 and coronal image 73 of series 8), presumably a vegetation. Mediastinum/Nodes: No pathologically enlarged mediastinal or hilar lymph nodes. Esophagus is unremarkable in appearance. No axillary lymphadenopathy. Lungs/Pleura: No suspicious pulmonary nodules or masses are noted. No acute consolidative airspace disease. No pleural effusions. Musculoskeletal: In the T10 vertebral body there is a large lucent lesion with extension of soft tissue into the right  paravertebral region and involvement of the head of the right tenth rib overall estimated to measure approximately 4.9 x 3.4 x 2.0 cm (axial image 105 of series 6 and sagittal image 117 of series 9). This causes significant narrowing of the central spinal canal (approximately 5 mm AP) at this level. Median sternotomy wires. Review of the MIP images confirms the above findings. CTA ABDOMEN AND PELVIS FINDINGS VASCULAR Aorta: Mild aortic atherosclerosis. Normal caliber aorta without aneurysm, dissection, vasculitis or significant stenosis. Celiac: Patent without evidence of aneurysm, dissection, vasculitis or significant stenosis. SMA: Patent without evidence of aneurysm, dissection, vasculitis or significant stenosis. Renals: Both renal arteries are patent without evidence of aneurysm, dissection, vasculitis, fibromuscular dysplasia or significant stenosis. IMA: Patent without evidence of aneurysm, dissection, vasculitis or significant stenosis. Inflow: Patent without evidence of aneurysm, dissection, vasculitis or significant stenosis. Veins: No obvious venous abnormality within the limitations of this arterial phase study. Review of the MIP images confirms the above findings. NON-VASCULAR Hepatobiliary: No suspicious appearing cystic or solid hepatic lesions are confidently identified on today's arterial phase examination. No intra or extrahepatic biliary ductal dilatation. Several partially calcified gallstones are noted measuring up to 1.6 cm in diameter. Gallbladder is otherwise unremarkable in appearance. Pancreas: No pancreatic mass. No pancreatic ductal dilatation. No pancreatic or peripancreatic fluid collections or inflammatory changes. Spleen: Unremarkable. Adrenals/Urinary Tract: Bilateral kidneys and adrenal glands are normal in appearance. No hydroureteronephrosis. Urinary bladder is normal in appearance. Stomach/Bowel: Normal appearance of the stomach. No pathologic dilatation of small bowel or colon.  The appendix is not confidently identified and may be surgically absent. Regardless, there are no inflammatory changes noted adjacent to the cecum to suggest the presence of an acute appendicitis at this time. Lymphatic: No lymphadenopathy noted in the abdomen or pelvis. Reproductive: Prostate gland and seminal vesicles are unremarkable in appearance. Other: No significant volume of ascites.  No pneumoperitoneum. Musculoskeletal: There are no aggressive appearing lytic or blastic lesions noted in the visualized portions of the skeleton. Review of the MIP images confirms the  above findings. IMPRESSION: 1. 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 2. Small lesions associated with the undersurface of the mechanical aortic valve and the superior aspect of the septal leaflet of the tricuspid valve, which may reflect valve associated thrombus and/or vegetations, as detailed above. 3. Aggressive appearing lesion in T10 vertebral body with extends in into the right paravertebral soft tissues and involvement of the head of the right tenth rib, with marked narrowing of the central spinal canal at this level, as detailed above. Primary differential considerations include a plasmacytoma, or less likely and osteoblastoma or solitary metastatic lesion (no primary lesion is confidently identified). If there is clinical concern for spinal cord compression, the full extent of this lesion could be better evaluated with thoracic spine MRI with and without IV gadolinium. 4. Bovine type thoracic aortic arch (normal anatomical variant) incidentally noted. 5. Cholelithiasis without evidence of acute cholecystitis at this time. 6. Additional incidental findings, as above. These results will be called to the ordering clinician or representative by the Radiologist Assistant, and  communication documented in the PACS or [employer]. Electronically Signed   By: Vinnie Langton M.D.   On: 09/12/2019 13:02   ECHOCARDIOGRAM COMPLETE  Result Date: 09/09/2019    ECHOCARDIOGRAM REPORT   Patient Name:   SEVAN MCBROOM Date of Exam: 09/09/2019 Medical Rec #:  [id number]        Height:       74.0 in Accession #:    [id number]       Weight:       282.2 lb Date of Birth:  July 25, 1965        BSA:          2.516 m Patient Age:    69 years         BP:           120/74 mmHg Patient Gender: M                HR:           77 bpm. Exam Location:  Inpatient Procedure: 2D Echo, Cardiac Doppler and Color Doppler Indications:    Dyspnea 786.09  History:        Patient has prior history of Echocardiogram examinations, most                 recent 10/24/2017. CAD, Prior CABG, Signs/Symptoms:Shortness of                 Breath and Chest Pain; Risk Factors:Hypertension, Diabetes,                 Dyslipidemia and Non-Smoker. Bentall.                 Aortic Valve: mechanical valve is present in the aortic                 position. Procedure Date: 2000.  Sonographer:    Vickie Epley RDCS Referring Phys: [id number] [place]  1. Tricuspid valve vegetation. Discussed with referring provider.  2. Left ventricular ejection fraction, by estimation, is 55 to 60%. The left ventricle has normal function. The left ventricle has no regional wall motion abnormalities. Left ventricular diastolic parameters were normal.  3. Right ventricular systolic function is normal. The right ventricular size is mildly enlarged. There is normal pulmonary artery systolic pressure.  4.  Left atrial size was moderately dilated.  5. Right atrial size was severely dilated.  6. The mitral valve is normal in structure. Mild mitral valve regurgitation. No evidence of mitral stenosis.  7. There is a 1.8 x 1.1 cm tricuspid valve mass on the atrial surface consistent with vegetation. . Tricuspid valve regurgitation is moderate.  8.  Transaortic velocity is higher than expected for mechanical aortic valve. The aortic valve is normal in structure. Aortic valve regurgitation is not visualized. No aortic stenosis is present. There is a mechanical valve present in the aortic position. Procedure Date: 2000. Aortic valve mean gradient measures 33.5 mmHg. Aortic valve Vmax measures 3.68 m/s.  9. Aortic root/ascending aorta has been repaired/replaced and Bentall. 10. The inferior vena cava is normal in size with greater than 50% respiratory variability, suggesting right atrial pressure of 3 mmHg. FINDINGS  Left Ventricle: Left ventricular ejection fraction, by estimation, is 55 to 60%. The left ventricle has normal function. The left ventricle has no regional wall motion abnormalities. The left ventricular internal cavity size was normal in size. There is  no left ventricular hypertrophy. Left ventricular diastolic parameters were normal. Right Ventricle: The right ventricular size is mildly enlarged. No increase in right ventricular wall thickness. Right ventricular systolic function is normal. There is normal pulmonary artery systolic pressure. The tricuspid regurgitant velocity is 2.51  m/s, and with an assumed right atrial pressure of 3 mmHg, the estimated right ventricular systolic pressure is 01.6 mmHg. Left Atrium: Left atrial size was moderately dilated. Right Atrium: Right atrial size was severely dilated. Pericardium: There is no evidence of pericardial effusion. Mitral Valve: The mitral valve is normal in structure. Normal mobility of the mitral valve leaflets. Mild mitral annular calcification. Mild mitral valve regurgitation. No evidence of mitral valve stenosis. Tricuspid Valve: There is a 1.8 x 1.1 cm tricuspid valve mass on the atrial surface consistent with vegetation. The tricuspid valve is normal in structure. Tricuspid valve regurgitation is moderate . No evidence of tricuspid stenosis. Aortic Valve: Transaortic velocity is higher  than expected for mechanical aortic valve. The aortic valve is normal in structure. Aortic valve regurgitation is not visualized. No aortic stenosis is present. Aortic valve mean gradient measures 33.5 mmHg. Aortic valve peak gradient measures 54.3 mmHg. Aortic valve area, by VTI measures 0.93 cm. There is a mechanical valve present in the aortic position. Procedure Date: 2000. Pulmonic Valve: The pulmonic valve was normal in structure. Pulmonic valve regurgitation is mild. No evidence of pulmonic stenosis. Aorta: The aortic root/ascending aorta has been repaired/replaced and Bentall. Venous: The inferior vena cava is normal in size with greater than 50% respiratory variability, suggesting right atrial pressure of 3 mmHg. IAS/Shunts: No atrial level shunt detected by color flow Doppler.  LEFT VENTRICLE PLAX 2D LVIDd:         5.60 cm      Diastology LVIDs:         4.60 cm      LV e' lateral:   13.10 cm/s LV PW:         1.10 cm      LV E/e' lateral: 5.7 LV IVS:        1.10 cm      LV e' medial:    8.49 cm/s LVOT diam:     2.20 cm      LV E/e' medial:  8.8 LV SV:         73 LV SV Index:   29 LVOT Area:  3.80 cm  LV Volumes (MOD) LV vol d, MOD A2C: 163.0 ml LV vol d, MOD A4C: 157.0 ml LV vol s, MOD A2C: 88.8 ml LV vol s, MOD A4C: 72.8 ml LV SV MOD A2C:     74.2 ml LV SV MOD A4C:     157.0 ml LV SV MOD BP:      77.9 ml RIGHT VENTRICLE RV S prime:     9.25 cm/s TAPSE (M-mode): 1.8 cm LEFT ATRIUM             Index       RIGHT ATRIUM           Index LA diam:        5.80 cm 2.31 cm/m  RA Area:     37.80 cm LA Vol (A2C):   65.8 ml 26.15 ml/m RA Volume:   141.00 ml 56.04 ml/m LA Vol (A4C):   76.5 ml 30.41 ml/m LA Biplane Vol: 77.8 ml 30.92 ml/m  AORTIC VALVE AV Area (Vmax):    0.95 cm AV Area (Vmean):   0.88 cm AV Area (VTI):     0.93 cm AV Vmax:           368.50 cm/s AV Vmean:          273.000 cm/s AV VTI:            0.786 m AV Peak Grad:      54.3 mmHg AV Mean Grad:      33.5 mmHg LVOT Vmax:         92.00 cm/s  LVOT Vmean:        63.300 cm/s LVOT VTI:          0.193 m LVOT/AV VTI ratio: 0.25  AORTA Ao Root diam: 2.90 cm MITRAL VALVE               TRICUSPID VALVE MV Area (PHT): 5.02 cm    TR Peak grad:   25.2 mmHg MV Decel Time: 151 msec    TR Vmax:        251.00 cm/s MR Peak grad: 107.3 mmHg MR Vmax:      518.00 cm/s  SHUNTS MV E velocity: 74.60 cm/s  Systemic VTI:  0.19 m MV A velocity: 37.70 cm/s  Systemic Diam: 2.20 cm MV E/A ratio:  1.98 Candee Furbish MD Electronically signed by Candee Furbish MD Signature Date/Time: 09/09/2019/12:06:06 PM    Final    ECHO TEE  Result Date: 09/11/2019    TRANSESOPHOGEAL ECHO REPORT   Patient Name:   DONNEL VENUTO Date of Exam: 09/11/2019 Medical Rec #:  [id number]        Height:       74.0 in Accession #:    [id number]       Weight:       280.0 lb Date of Birth:  1965-11-02        BSA:          2.508 m Patient Age:    62 years         BP:           108/65 mmHg Patient Gender: M                HR:           106 bpm. Exam Location:  Inpatient Procedure: Transesophageal Echo, Cardiac Doppler and Color Doppler Indications:     Bacteremia.  History:         Patient has prior  history of Echocardiogram examinations, most                  recent 09/09/2019. CAD, Aortic Valve Disease, Endocarditis,                  Tricuspid vegetation and Prosthetic Valve Complications,                  Signs/Symptoms:Altered Mental Status; Risk Factors:Diabetes,                  Hypertension and Dyslipidemia. Aortic valve replacement.                  Bentall procedure. Aortic dissection.                  Aortic Valve: unknown Medtronic tilting disk valve is present                  in the aortic position. Procedure Date: 2000.  Sonographer:     Roseanna Rainbow RDCS Referring Phys:  [id number] RHONDA G BARRETT Diagnosing Phys: Sanda Klein MD PROCEDURE: After discussion of the risks and benefits of a TEE, an informed consent was obtained from the patient. The transesophogeal probe was passed without difficulty through  the esophogus of the patient. Imaged were obtained with the patient in a left lateral decubitus position. Sedation performed by different physician. The patient was monitored while under deep sedation. Anesthestetic sedation was provided intravenously by Anesthesiology: '230mg'$  of Propofol. The patient's vital signs; including heart rate, blood pressure, and oxygen saturation; remained stable throughout the procedure. The patient developed no complications during the procedure. IMPRESSIONS  1. Left ventricular ejection fraction, by estimation, is 55 to 60%. The left ventricle has normal function. The left ventricle has no regional wall motion abnormalities. There is mild concentric left ventricular hypertrophy.  2. Right ventricular systolic function is normal. The right ventricular size is mildly enlarged. There is mildly elevated pulmonary artery systolic pressure. The estimated right ventricular systolic pressure is 96.0 mmHg.  3. It appears that the left atrial appendage has been surgically resected or oversown. Left atrial size was severely dilated. No left atrial/left atrial appendage thrombus was detected. The LAA emptying velocity was 35 cm/s.  4. Right atrial size was severely dilated.  5. The mitral valve is normal in structure. Mild mitral valve regurgitation.  6. There is a large, moderately mobile vegetation attached to the septal leaflet of the tricuspid valve, measuring 27 mm in length and 11 mm in width. It appears heterogeneous and friable.. Tricuspid valve regurgitation is moderate.  7. There is a large vegetation attached to the medial aspect of posterior disc or the posteromedial mechanical valve annulus. It is highly mobile and prolapses readily across the valve orifice. It measures 15 mm in length and 6 mm in width. Although there is no evidence of a periannular abscess cavity, the periannular tissue echodensity is heterogeneous, raining concern for inflammation/early abscess formation. The  proximity of the aortic and the tricuspid vegetation to the same area of the interventricular septum also raises concern for intramyocardial abscess or fistula formation. The aortic valve has been repaired/replaced. Aortic valve regurgitation is trivial. Mild aortic valve stenosis. There is a unknown Medtronic tilting disk valve present in the aortic position. Procedure Date: 2000. Aortic valve mean gradient measures 20.0 mmHg.  8. There is mild (Grade II) atheroma plaque involving the transverse aorta. Comparison(s): Compared to previous reports, there is evidence of vegetations on the tricuspid  valve and the mechanical aortic valve and suspicion for early perivalvular abscess. The dimensionless aortic vale index is similar to older studies. This suggests that the increased aortic valve gradients are due to increased cardiac output (rather than obstruction from the vegetation or compromised prosthetic disc motion). FINDINGS  Left Ventricle: Left ventricular ejection fraction, by estimation, is 55 to 60%. The left ventricle has normal function. The left ventricle has no regional wall motion abnormalities. The left ventricular internal cavity size was normal in size. There is  mild concentric left ventricular hypertrophy. Abnormal (paradoxical) septal motion consistent with post-operative status. Right Ventricle: The right ventricular size is mildly enlarged. No increase in right ventricular wall thickness. Right ventricular systolic function is normal. There is mildly elevated pulmonary artery systolic pressure. The tricuspid regurgitant velocity is 2.48 m/s, and with an assumed right atrial pressure of 8 mmHg, the estimated right ventricular systolic pressure is 15.4 mmHg. Left Atrium: It appears that the left atrial appendage has been surgically resected or oversown. Left atrial size was severely dilated. Spontaneous echo contrast was present in the left atrium. No left atrial/left atrial appendage thrombus was  detected. The LAA emptying velocity was 35 cm/s. Right Atrium: Right atrial size was severely dilated. Prominent Eustachian valve. Pericardium: There is no evidence of pericardial effusion. Mitral Valve: The mitral valve is normal in structure. Mild mitral valve regurgitation, with centrally-directed jet. Tricuspid Valve: There is a large, moderately mobile vegetation attached to the septal leaflet of the tricuspid valve, measuring 27 mm in length and 11 mm in width. It appears heterogeneous and friable. The tricuspid valve is normal in structure. Tricuspid valve regurgitation is moderate. Aortic Valve: There is a large vegetation attached to the medial aspect of posterior disc or the posteromedial mechanical valve annulus. It is highly mobile and prolapses readily across the valve orifice. It measures 15 mm in length and 6 mm in width. Although there is no evidence of a periannular abscess cavity, the periannular tissue echodensity is heterogeneous, raining concern for inflammation/early abscess formation. The proximity of the aortic and the tricuspid vegetation to the same area of the  interventricular septum also raises concern for intramyocardial abscess or fistula formation. The aortic valve has been repaired/replaced. Aortic valve regurgitation is trivial. Mild aortic stenosis is present. Aortic valve mean gradient measures 20.0 mmHg. Aortic valve peak gradient measures 32.9 mmHg. There is a unknown Medtronic tilting disk valve present in the aortic position. Procedure Date: 2000. Pulmonic Valve: The pulmonic valve was normal in structure. Pulmonic valve regurgitation is trivial. Aorta: The aortic root, ascending aorta, aortic arch and descending aorta are all structurally normal, with no evidence of dilitation or obstruction. There is mild (Grade II) atheroma plaque involving the transverse aorta. IAS/Shunts: No atrial level shunt detected by color flow Doppler.  AORTIC VALVE AV Vmax:           287.00 cm/s AV  Vmean:          208.000 cm/s AV VTI:            0.444 m AV Peak Grad:      32.9 mmHg AV Mean Grad:      20.0 mmHg LVOT Vmax:         126.00 cm/s LVOT Vmean:        90.500 cm/s LVOT VTI:          0.223 m LVOT/AV VTI ratio: 0.50 TRICUSPID VALVE TR Peak grad:   24.6 mmHg TR Vmax:  248.00 cm/s  SHUNTS Systemic VTI: 0.22 m Sanda Klein MD Electronically signed by Sanda Klein MD Signature Date/Time: 09/11/2019/2:35:26 PM    Final    VAS US DOPPLER PRE CABG  Result Date: 09/12/2019 PREOPERATIVE VASCULAR EVALUATION  Indications:      Pre-CABG. Risk Factors:     Hypertension, hyperlipidemia, Diabetes. Comparison Study: No prior studies. Performing Technologist: Carlos Levering Rvt  Examination Guidelines: A complete evaluation includes B-mode imaging, spectral Doppler, color Doppler, and power Doppler as needed of all accessible portions of each vessel. Bilateral testing is considered an integral part of a complete examination. Limited examinations for reoccurring indications may be performed as noted.  Right Carotid Findings: +----------+--------+--------+--------+-----------------------+--------+             PSV cm/s EDV cm/s Stenosis Describe                Comments  +----------+--------+--------+--------+-----------------------+--------+  CCA Prox   157      16                smooth and heterogenous tortuous  +----------+--------+--------+--------+-----------------------+--------+  CCA Distal 46       13                smooth and heterogenous           +----------+--------+--------+--------+-----------------------+--------+  ICA Prox   50       16                smooth and heterogenous           +----------+--------+--------+--------+-----------------------+--------+  ICA Distal 42       18                                        tortuous  +----------+--------+--------+--------+-----------------------+--------+  ECA        74       8                                                    +----------+--------+--------+--------+-----------------------+--------+ Portions of this table do not appear on this page. +----------+--------+-------+--------+------------+             PSV cm/s EDV cms Describe Arm Pressure  +----------+--------+-------+--------+------------+  Subclavian 81                        134           +----------+--------+-------+--------+------------+ +---------+--------+--+--------+--+---------+  Vertebral PSV cm/s 33 EDV cm/s 10 Antegrade  +---------+--------+--+--------+--+---------+ Left Carotid Findings: +----------+--------+--------+--------+-----------------------+--------+             PSV cm/s EDV cm/s Stenosis Describe                Comments  +----------+--------+--------+--------+-----------------------+--------+  CCA Prox   65       16                smooth and heterogenous           +----------+--------+--------+--------+-----------------------+--------+  CCA Distal 62       9                 smooth and heterogenous           +----------+--------+--------+--------+-----------------------+--------+  ICA Prox  40       17                                        tortuous  +----------+--------+--------+--------+-----------------------+--------+  ICA Distal 56       29                                        tortuous  +----------+--------+--------+--------+-----------------------+--------+  ECA        45       6                                                   +----------+--------+--------+--------+-----------------------+--------+ +----------+--------+--------+--------+------------+  Subclavian PSV cm/s EDV cm/s Describe Arm Pressure  +----------+--------+--------+--------+------------+             71                         136           +----------+--------+--------+--------+------------+ +---------+--------+--+--------+-+---------+  Vertebral PSV cm/s 38 EDV cm/s 9 Antegrade  +---------+--------+--+--------+-+---------+  ABI Findings:  +--------+------------------+-----+---------+--------+  Right    Rt Pressure (mmHg) Index Waveform  Comment   +--------+------------------+-----+---------+--------+  Brachial 134                      triphasic           +--------+------------------+-----+---------+--------+  PTA      147                1.08  triphasic           +--------+------------------+-----+---------+--------+  DP       176                1.29  triphasic           +--------+------------------+-----+---------+--------+ +--------+------------------+-----+---------+-------+  Left     Lt Pressure (mmHg) Index Waveform  Comment  +--------+------------------+-----+---------+-------+  Brachial 136                      triphasic          +--------+------------------+-----+---------+-------+  PTA      122                0.90  triphasic          +--------+------------------+-----+---------+-------+  DP       155                1.14  triphasic          +--------+------------------+-----+---------+-------+ +-------+---------------+----------------+  ABI/TBI Today's ABI/TBI Previous ABI/TBI  +-------+---------------+----------------+  Right   1.29                              +-------+---------------+----------------+  Left    1.14                              +-------+---------------+----------------+  Right Doppler Findings: +--------+--------+-----+---------+--------+  Site     Pressure Index Doppler   Comments  +--------+--------+-----+---------+--------+  Brachial  134            triphasic           +--------+--------+-----+---------+--------+  Radial                  triphasic           +--------+--------+-----+---------+--------+  Ulnar                   triphasic           +--------+--------+-----+---------+--------+  Left Doppler Findings: +--------+--------+-----+---------+--------+  Site     Pressure Index Doppler   Comments  +--------+--------+-----+---------+--------+  Brachial 136            triphasic            +--------+--------+-----+---------+--------+  Radial                  triphasic           +--------+--------+-----+---------+--------+  Ulnar                   triphasic           +--------+--------+-----+---------+--------+  Summary: Right Carotid: Velocities in the right ICA are consistent with a 1-39% stenosis. Left Carotid: Velocities in the left ICA are consistent with a 1-39% stenosis. Vertebrals: Bilateral vertebral arteries demonstrate antegrade flow. Right ABI: Resting right ankle-brachial index is within normal range. No evidence of significant right lower extremity arterial disease. Left ABI: Resting left ankle-brachial index is within normal range. No evidence of significant left lower extremity arterial disease. Right Upper Extremity: Doppler waveforms decrease >50% with right radial compression. Doppler waveform obliterate with right ulnar compression. Left Upper Extremity: Doppler waveform obliterate with left radial compression. Doppler waveforms remain within normal limits with left ulnar compression.  Electronically signed by Monica Martinez MD on 09/12/2019 at 8:06:35 PM.    Final    [place] EKG SITE RITE  Result Date: 09/14/2019 If Site Rite image not attached, placement could not be confirmed due to current cardiac rhythm.  CT Angio Abd/Pel w/ and/or w/o  Result Date: 09/12/2019 CLINICAL DATA:  54 year old male under preoperative evaluation prior to potential redo Bentall procedure. EXAM: CT ANGIOGRAPHY CHEST, ABDOMEN AND PELVIS TECHNIQUE: Non-contrast CT of the chest was initially obtained. Multidetector CT imaging through the chest, abdomen and pelvis was performed using the standard protocol during bolus administration of intravenous contrast. Multiplanar reconstructed images and MIPs were obtained and reviewed to evaluate the vascular anatomy. CONTRAST:  168m OMNIPAQUE IOHEXOL 350 MG/ML SOLN COMPARISON:  Chest CTA 10/09/2014. CTA of the chest, abdomen and pelvis 07/24/2013. FINDINGS:  CTA CHEST FINDINGS Cardiovascular: Heart size is enlarged. There is no significant pericardial fluid, thickening or pericardial calcification. 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 There is a small defect on the undersurface of the mechanical aortic valve best appreciated on coronal image 82 of series 8 measuring approximately 8 mm, which may represent valve associated thrombus and/or vegetation. The aortic arch is mildly aneurysmal measuring up to 4.3 cm in diameter. There is a residual dissection flap in the proximal and mid aortic arch which does not propagate into the great vessels. Descending thoracic aorta is normal in caliber measuring 2.8 cm in diameter. Bovine type thoracic aortic arch incidentally noted. There is a right coronary artery bypass graft. Large filling defect in the superior aspect of the right atrium immediately posterior to the septal leaflet of the tricuspid valve, and  intimately associated with the adjacent aortic root, estimated to measure approximately 2.2 x 2.2 x 1.7 cm (axial image 83 of series 6 and coronal image 73 of series 8), presumably a vegetation. Mediastinum/Nodes: No pathologically enlarged mediastinal or hilar lymph nodes. Esophagus is unremarkable in appearance. No axillary lymphadenopathy. Lungs/Pleura: No suspicious pulmonary nodules or masses are noted. No acute consolidative airspace disease. No pleural effusions. Musculoskeletal: In the T10 vertebral body there is a large lucent lesion with extension of soft tissue into the right paravertebral region and involvement of the head of the right tenth rib overall estimated to measure approximately 4.9 x 3.4 x 2.0 cm (axial image 105 of series 6 and sagittal image 117 of series 9). This causes significant narrowing of the central spinal canal (approximately 5 mm AP) at this level. Median sternotomy wires. Review of the MIP images confirms the  above findings. CTA ABDOMEN AND PELVIS FINDINGS VASCULAR Aorta: Mild aortic atherosclerosis. Normal caliber aorta without aneurysm, dissection, vasculitis or significant stenosis. Celiac: Patent without evidence of aneurysm, dissection, vasculitis or significant stenosis. SMA: Patent without evidence of aneurysm, dissection, vasculitis or significant stenosis. Renals: Both renal arteries are patent without evidence of aneurysm, dissection, vasculitis, fibromuscular dysplasia or significant stenosis. IMA: Patent without evidence of aneurysm, dissection, vasculitis or significant stenosis. Inflow: Patent without evidence of aneurysm, dissection, vasculitis or significant stenosis. Veins: No obvious venous abnormality within the limitations of this arterial phase study. Review of the MIP images confirms the above findings. NON-VASCULAR Hepatobiliary: No suspicious appearing cystic or solid hepatic lesions are confidently identified on today's arterial phase examination. No intra or extrahepatic biliary ductal dilatation. Several partially calcified gallstones are noted measuring up to 1.6 cm in diameter. Gallbladder is otherwise unremarkable in appearance. Pancreas: No pancreatic mass. No pancreatic ductal dilatation. No pancreatic or peripancreatic fluid collections or inflammatory changes. Spleen: Unremarkable. Adrenals/Urinary Tract: Bilateral kidneys and adrenal glands are normal in appearance. No hydroureteronephrosis. Urinary bladder is normal in appearance. Stomach/Bowel: Normal appearance of the stomach. No pathologic dilatation of small bowel or colon. The appendix is not confidently identified and may be surgically absent. Regardless, there are no inflammatory changes noted adjacent to the cecum to suggest the presence of an acute appendicitis at this time. Lymphatic: No lymphadenopathy noted in the abdomen or pelvis. Reproductive: Prostate gland and seminal vesicles are unremarkable in appearance. Other: No  significant volume of ascites.  No pneumoperitoneum. Musculoskeletal: There are no aggressive appearing lytic or blastic lesions noted in the visualized portions of the skeleton. Review of the MIP images confirms the above findings. IMPRESSION: 1. 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 2. Small lesions associated with the undersurface of the mechanical aortic valve and the superior aspect of the septal leaflet of the tricuspid valve, which may reflect valve associated thrombus and/or vegetations, as detailed above. 3. Aggressive appearing lesion in T10 vertebral body with extends in into the right paravertebral soft tissues and involvement of the head of the right tenth rib, with marked narrowing of the central spinal canal at this level, as detailed above. Primary differential considerations include a plasmacytoma, or less likely and osteoblastoma or solitary metastatic lesion (no primary lesion is confidently identified). If there is clinical concern for spinal cord compression, the full extent of this lesion could be better evaluated with thoracic spine MRI with and without IV gadolinium. 4. Bovine type thoracic aortic  arch (normal anatomical variant) incidentally noted. 5. Cholelithiasis without evidence of acute cholecystitis at this time. 6. Additional incidental findings, as above. These results will be called to the ordering clinician or representative by the Radiologist Assistant, and communication documented in the PACS or [employer]. Electronically Signed   By: Vinnie Langton M.D.   On: 09/12/2019 13:02    Labs:  CBC: Recent Labs    09/21/19 0308 09/22/19 0541 09/23/19 0458 09/24/19 0427  WBC 7.5 6.9 8.1 6.4  HGB 8.6* 8.8* 9.3* 8.8*  HCT 24.9* 25.7* 26.7* 25.5*  PLT 171 184 199 175    COAGS: Recent Labs     09/21/19 0308 09/22/19 0541 09/23/19 0458 09/24/19 0427  INR 1.6* 1.4* 1.3* 1.2    BMP: Recent Labs    09/21/19 0308 09/22/19 0541 09/23/19 0458 09/24/19 0427  NA 139 139 139 139  K 3.8 3.7 3.6 3.6  CL 110 109 108 110  CO2 '22 23 24 22  '$ GLUCOSE 147* 136* 142* 155*  BUN '11 12 12 14  '$ CALCIUM 8.4* 8.7* 9.0 9.1  CREATININE 1.34* 1.38* 1.30* 1.30*  GFRNONAA 60* 58* >60 >60  GFRAA >60 >60 >60 >60    LIVER FUNCTION TESTS: Recent Labs    09/14/19 0339 09/15/19 0530 09/17/19 0400 09/18/19 0642  BILITOT 0.8 1.1 1.0 0.7  AST '21 21 23 22  '$ ALT '12 13 12 11  '$ ALKPHOS 70 67 67 68  PROT 6.8 6.6 7.5 7.7  ALBUMIN 2.3* 2.4* 2.6* 2.7*     Assessment and Plan:  Plasma cell neoplasm, seen on T10 lytic lesion biopsy obtained in IR 09/18/2019. Plan for image-guided bone marrow biopsy/aspiration tentatively for tomorrow 09/25/2019 at 0830 in IR pending scheduling. Patient will be NPO at midnight. Afebrile and WBCs WNL. Will order CBC with differential for AM of procedure.  Risks and benefits discussed with the patient including, but not limited to bleeding, infection, damage to adjacent structures or low yield requiring additional tests. All of the patient's questions were answered, patient is agreeable to proceed. Consent signed and in chart.   Thank you for this interesting consult.  I greatly enjoyed meeting [name] and look forward to participating in their care.  A copy of this report was sent to the requesting provider on this date.  Electronically Signed: Earley Abide, PA-C 09/24/2019, 3:00 PM   I spent a total of 40 Minutes in face to face in clinical consultation, greater than 50% of which was counseling/coordinating care for plasma cell neoplasm/bone marrow aspiration and biopsy.

## 2019-09-24 NOTE — Progress Notes (Signed)
Progress Note  Patient Name: [name] Date of Encounter: 09/24/2019  Primary Cardiologist: Kirk Ruths, MD   Subjective   No recurrent gingival bleeding on heparin. No chest pain or dyspnea.   Inpatient Medications    Scheduled Meds: . aspirin EC  81 mg Oral Daily  . atorvastatin  40 mg Oral Q breakfast  . colchicine  0.6 mg Oral BID  . feeding supplement  1 Container Oral BID BM  . feeding supplement (ENSURE ENLIVE)  237 mL Oral BID BM  . feeding supplement (PRO-STAT SUGAR FREE 64)  30 mL Oral BID  . gabapentin  300 mg Oral Q breakfast  . insulin aspart  0-15 Units Subcutaneous TID WC  . insulin aspart  0-5 Units Subcutaneous QHS  . insulin aspart  6 Units Subcutaneous TID WC  . insulin detemir  6 Units Subcutaneous BID  . levothyroxine  50 mcg Oral Q0600  . multivitamin with minerals  1 tablet Oral Daily  . polyethylene glycol  17 g Oral BID  . sodium chloride flush  10-40 mL Intracatheter Q12H  . sodium chloride flush  3 mL Intravenous Once  . sodium chloride flush  3 mL Intravenous Q12H   Continuous Infusions: . sodium chloride    . cefTRIAXone (ROCEPHIN)  IV 2 g (09/23/19 1142)  . gentamicin 220 mg (09/23/19 0502)  . heparin 1,900 Units/hr (09/23/19 2047)  . lactated ringers     PRN Meds: sodium chloride, acetaminophen **OR** acetaminophen, HYDROcodone-acetaminophen, nitroGLYCERIN, ondansetron **OR** ondansetron (ZOFRAN) IV, sodium chloride flush, sodium chloride flush   Vital Signs    Vitals:   09/23/19 1331 09/23/19 2001 09/23/19 2052 09/24/19 0559  BP: (!) 134/93 128/88  130/88  Pulse: 87 94  96  Resp: '20 18  16  '$ Temp: 98.2 F (36.8 C) 99.5 F (37.5 C)  99.4 F (37.4 C)  TempSrc: Oral Oral  Oral  SpO2: 100% 98% 100% 97%  Weight:    130.6 kg  Height:        Intake/Output Summary (Last 24 hours) at 09/24/2019 0804 Last data filed at 09/24/2019 0707 Gross per 24 hour  Intake 133 ml  Output 600 ml  Net -467 ml   Last 3 Weights 09/24/2019  09/23/2019 09/22/2019  Weight (lbs) 287 lb 14.4 oz 288 lb 11.2 oz 291 lb 3.6 oz  Weight (kg) 130.591 kg 130.953 kg 132.1 kg      Telemetry    Sinus rhythm with PACs - Personally Reviewed  ECG    No new tracing.   Physical Exam   GEN: No acute distress.   Neck: No JVD Cardiac: Irregular, + murmurs, rubs, or gallops.  Respiratory: Clear to auscultation bilaterally. GI: Soft, nontender, non-distended  MS: No edema; No deformity. Neuro:  Nonfocal  Psych: Normal affect   Labs    High Sensitivity Troponin:   Recent Labs  Lab 09/08/19 0810 09/08/19 1100  TROPONINIHS 34* 28*      Chemistry Recent Labs  Lab 09/18/19 7622 09/19/19 1653 09/22/19 0541 09/23/19 0458 09/24/19 0427  NA 137   < > 139 139 139  K 3.8   < > 3.7 3.6 3.6  CL 106   < > 109 108 110  CO2 24   < > '23 24 22  '$ GLUCOSE 143*   < > 136* 142* 155*  BUN 13   < > '12 12 14  '$ CREATININE 1.38*   < > 1.38* 1.30* 1.30*  CALCIUM 8.7*   < >  8.7* 9.0 9.1  PROT 7.7  --   --   --   --   ALBUMIN 2.7*  --   --   --   --   AST 22  --   --   --   --   ALT 11  --   --   --   --   ALKPHOS 68  --   --   --   --   BILITOT 0.7  --   --   --   --   GFRNONAA 58*   < > 58* >60 >60  GFRAA >60   < > >60 >60 >60  ANIONGAP 7   < > '7 7 7   '$ < > = values in this interval not displayed.     Hematology Recent Labs  Lab 09/22/19 0541 09/23/19 0458 09/24/19 0427  WBC 6.9 8.1 6.4  RBC 3.06* 3.19* 3.01*  HGB 8.8* 9.3* 8.8*  HCT 25.7* 26.7* 25.5*  MCV 84.0 83.7 84.7  MCH 28.8 29.2 29.2  MCHC 34.2 34.8 34.5  RDW 15.9* 16.0* 16.3*  PLT 184 199 175    Radiology    No results found.  Cardiac Studies   RIGHT HEART CATH AND CORONARY/GRAFT ANGIOGRAPHY 09/16/19  Conclusion    Ost RCA to Prox RCA lesion is 100% stenosed.  SVG to RVA with Origin lesion is 80% stenosed in stent.  Hemodynamic findings consistent with mild pulmonary hypertension.   1. Single vessel occlusive CAD with CTO of the ostium of the RCA 2. SVG to  RCA with 80% in stent stenosis at the ostium. The RCA is a large vessel. 3. Mildly elevated LV filling pressures 4. Mild pulmonary HTN 5. Normal cardiac output.   Plan: Redo AVR and CABG to RCA.    TEE 09/11/19 1. Left ventricular ejection fraction, by estimation, is 55 to 60%. The  left ventricle has normal function. The left ventricle has no regional  wall motion abnormalities. There is mild concentric left ventricular  hypertrophy.  2. Right ventricular systolic function is normal. The right ventricular  size is mildly enlarged. There is mildly elevated pulmonary artery  systolic pressure. The estimated right ventricular systolic pressure is  09.8 mmHg.  3. It appears that the left atrial appendage has been surgically resected  or oversown. Left atrial size was severely dilated. No left atrial/left  atrial appendage thrombus was detected. The LAA emptying velocity was 35  cm/s.  4. Right atrial size was severely dilated.  5. The mitral valve is normal in structure. Mild mitral valve  regurgitation.  6. There is a large, moderately mobile vegetation attached to the septal  leaflet of the tricuspid valve, measuring 27 mm in length and 11 mm in  width. It appears heterogeneous and friable.. Tricuspid valve  regurgitation is moderate.  7. There is a large vegetation attached to the medial aspect of posterior  disc or the posteromedial mechanical valve annulus. It is highly mobile  and prolapses readily across the valve orifice. It measures 15 mm in  length and 6 mm in width. Although  there is no evidence of a periannular abscess cavity, the periannular  tissue echodensity is heterogeneous, raining concern for  inflammation/early abscess formation. The proximity of the aortic and the  tricuspid vegetation to the same area of the  interventricular septum also raises concern for intramyocardial abscess or  fistula formation. The aortic valve has been repaired/replaced. Aortic    valve regurgitation is trivial. Mild  aortic valve stenosis. There is a  unknown [employer] tilting disk valve  present in the aortic position. Procedure Date: 2000. Aortic valve mean  gradient measures 20.0 mmHg.  8. There is mild (Grade II) atheroma plaque involving the transverse  aorta.    TTE 09/09/19 1. Tricuspid valve vegetation. Discussed with referring provider.  2. Left ventricular ejection fraction, by estimation, is 55 to 60%. The  left ventricle has normal function. The left ventricle has no regional  wall motion abnormalities. Left ventricular diastolic parameters were  normal.  3. Right ventricular systolic function is normal. The right ventricular  size is mildly enlarged. There is normal pulmonary artery systolic  pressure.  4. Left atrial size was moderately dilated.  5. Right atrial size was severely dilated.  6. The mitral valve is normal in structure. Mild mitral valve  regurgitation. No evidence of mitral stenosis.  7. There is a 1.8 x 1.1 cm tricuspid valve mass on the atrial surface  consistent with vegetation. . Tricuspid valve regurgitation is moderate.  8. Transaortic velocity is higher than expected for mechanical aortic  valve. The aortic valve is normal in structure. Aortic valve regurgitation  is not visualized. No aortic stenosis is present. There is a mechanical  valve present in the aortic  position. Procedure Date: 2000. Aortic valve mean gradient measures 33.5  mmHg. Aortic valve Vmax measures 3.68 m/s.  9. Aortic root/ascending aorta has been repaired/replaced and Bentall.  10. The inferior vena cava is normal in size with greater than 50%  respiratory variability, suggesting right atrial pressure of 3 mmHg.    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 Additional issues:lytic lesion in his thoracic spine (T10), s/p biopsy on 04/28.Numerous dental problems, likely source of endocarditis, s/pdentalextractions 04/27.Gluteal area furuncle, spontaneously drained 4/23, currently packed. Steadily lengthening PR interval on ECG raises concern for periannular abscess formation.  Cardiac catheterization this admission shows occluded right coronary artery and 80% stenosis in the saphenous vein graft to the RCA. Transesophageal echocardiogram showed normal LV function, severe right atrial enlargement, mild mitral regurgitation, large vegetation on the tricuspid valve with moderate tricuspid regurgitation, large vegetation on the prosthetic aortic valve without evidence of periannular abscess, trace aortic insufficiency.  Assessment & Plan    1. Endocarditis  - AVR+ CABG rescheduled to Thursday due gingival bleeding which has now stopped and tolerating heparin.  - Abx per ID  2. CAD  - Cath this admission with single vessel occlusive CAD with CTO of the ostium of the RCA and 80% SVG to RCA instent stenosis.  - Plan for redo CABG with graft to RCA - No chest pain - On ASA and statin  3. PAF - Maintaining sinus rhythm. Heparin as above. - some irregular rhythm   4. CKD III - SCr stable at 1.3  5. T10 vertebral lesion - Per Dr. Annette Stable "Patient meets definition of multiple myeloma"   For questions or updates, please contact [facility] Please consult [email or web address] for contact info under        SignedLeanor Kail, PA  09/24/2019, 8:04 AM

## 2019-09-25 ENCOUNTER — Inpatient Hospital Stay (HOSPITAL_COMMUNITY): Payer: Medicare PPO

## 2019-09-25 ENCOUNTER — Other Ambulatory Visit: Payer: Self-pay

## 2019-09-25 DIAGNOSIS — I361 Nonrheumatic tricuspid (valve) insufficiency: Secondary | ICD-10-CM | POA: Diagnosis not present

## 2019-09-25 LAB — CBC WITH DIFFERENTIAL/PLATELET
Abs Immature Granulocytes: 0.02 10*3/uL (ref 0.00–0.07)
Basophils Absolute: 0 10*3/uL (ref 0.0–0.1)
Basophils Relative: 1 %
Eosinophils Absolute: 0.2 10*3/uL (ref 0.0–0.5)
Eosinophils Relative: 4 %
HCT: 24.7 % — ABNORMAL LOW (ref 39.0–52.0)
Hemoglobin: 8.5 g/dL — ABNORMAL LOW (ref 13.0–17.0)
Immature Granulocytes: 0 %
Lymphocytes Relative: 24 %
Lymphs Abs: 1.3 10*3/uL (ref 0.7–4.0)
MCH: 29.4 pg (ref 26.0–34.0)
MCHC: 34.4 g/dL (ref 30.0–36.0)
MCV: 85.5 fL (ref 80.0–100.0)
Monocytes Absolute: 0.6 10*3/uL (ref 0.1–1.0)
Monocytes Relative: 11 %
Neutro Abs: 3.3 10*3/uL (ref 1.7–7.7)
Neutrophils Relative %: 60 %
Platelets: 151 10*3/uL (ref 150–400)
RBC: 2.89 MIL/uL — ABNORMAL LOW (ref 4.22–5.81)
RDW: 16.7 % — ABNORMAL HIGH (ref 11.5–15.5)
WBC: 5.4 10*3/uL (ref 4.0–10.5)
nRBC: 0 % (ref 0.0–0.2)

## 2019-09-25 LAB — BLOOD GAS, ARTERIAL
Acid-base deficit: 0.5 mmol/L (ref 0.0–2.0)
Bicarbonate: 23.1 mmol/L (ref 20.0–28.0)
FIO2: 21
O2 Saturation: 98 %
Patient temperature: 36.7
pCO2 arterial: 33.7 mmHg (ref 32.0–48.0)
pH, Arterial: 7.449 (ref 7.350–7.450)
pO2, Arterial: 99.3 mmHg (ref 83.0–108.0)

## 2019-09-25 LAB — BASIC METABOLIC PANEL
Anion gap: 7 (ref 5–15)
BUN: 12 mg/dL (ref 6–20)
CO2: 22 mmol/L (ref 22–32)
Calcium: 8.7 mg/dL — ABNORMAL LOW (ref 8.9–10.3)
Chloride: 110 mmol/L (ref 98–111)
Creatinine, Ser: 1.27 mg/dL — ABNORMAL HIGH (ref 0.61–1.24)
GFR calc Af Amer: >60 mL/min (ref >60)
GFR calc non Af Amer: >60 mL/min (ref >60)
Glucose, Bld: 141 mg/dL — ABNORMAL HIGH (ref 70–99)
Potassium: 3.6 mmol/L (ref 3.5–5.1)
Sodium: 139 mmol/L (ref 135–145)

## 2019-09-25 LAB — PREPARE RBC (CROSSMATCH)

## 2019-09-25 LAB — HEPARIN LEVEL (UNFRACTIONATED): Heparin Unfractionated: 0.41 IU/mL (ref 0.30–0.70)

## 2019-09-25 LAB — PROTIME-INR
INR: 1.2 (ref 0.8–1.2)
Prothrombin Time: 14.8 seconds (ref 11.4–15.2)

## 2019-09-25 LAB — GLUCOSE, CAPILLARY
Glucose-Capillary: 127 mg/dL — ABNORMAL HIGH (ref 70–99)
Glucose-Capillary: 107 mg/dL — ABNORMAL HIGH (ref 70–99)
Glucose-Capillary: 144 mg/dL — ABNORMAL HIGH (ref 70–99)
Glucose-Capillary: 172 mg/dL — ABNORMAL HIGH (ref 70–99)

## 2019-09-25 IMAGING — CT CT BIOPSY AND ASPIRATION BONE MARROW
1 of 2 series · 15 of 32 positions shown, 19 images · non-contrast
Comparison: none

CLINICAL DATA: Plasma cell neoplasm

EXAM:
CT GUIDED DEEP ILIAC BONE ASPIRATION AND CORE BIOPSY
TECHNIQUE: Patient was placed prone on the CT gantry and limited axial scans
through the pelvis were obtained. Appropriate skin entry site was
identified. Skin site was marked, prepped with chlorhexidine, draped
in usual sterile fashion, and infiltrated locally with 1% lidocaine.

[Series 2: i-spiral 5.0 b40f · axial · 0.82mm/px · z∈[+1114,+1205]mm · 15 of 30 slices shown, 19 images]
[im 2/30  soft-tissue]
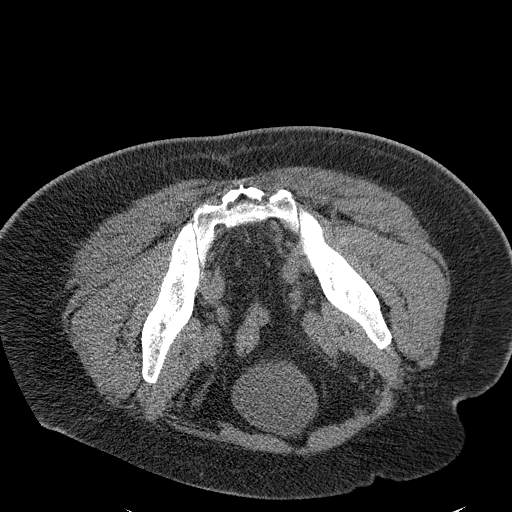
[im 2/30  bone]
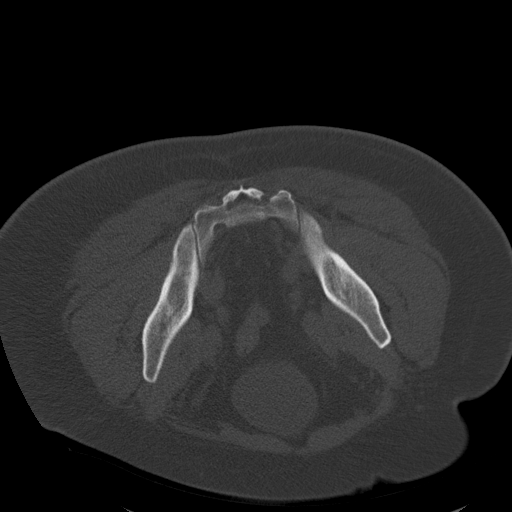
[im 4/30  soft-tissue]
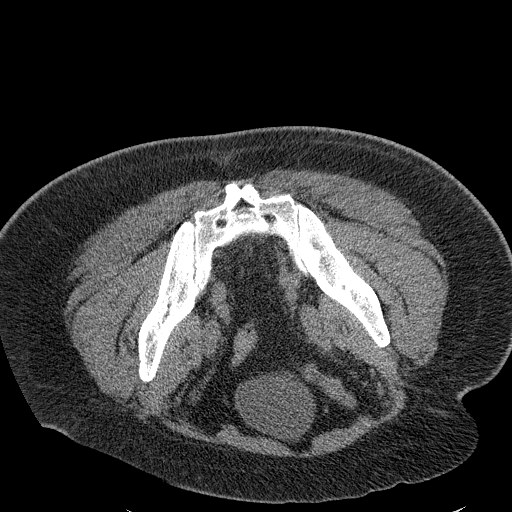
[im 7/30  soft-tissue]
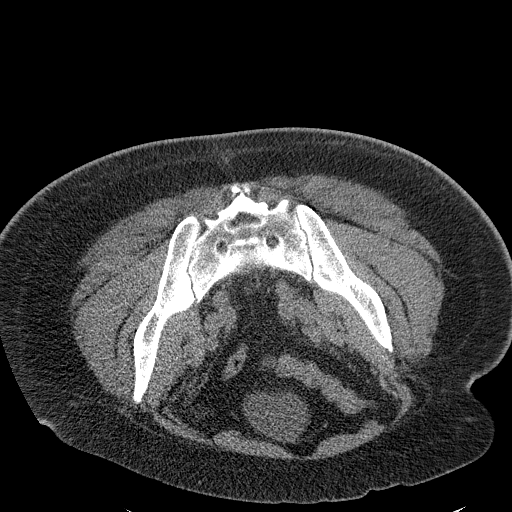
[im 9/30  soft-tissue]
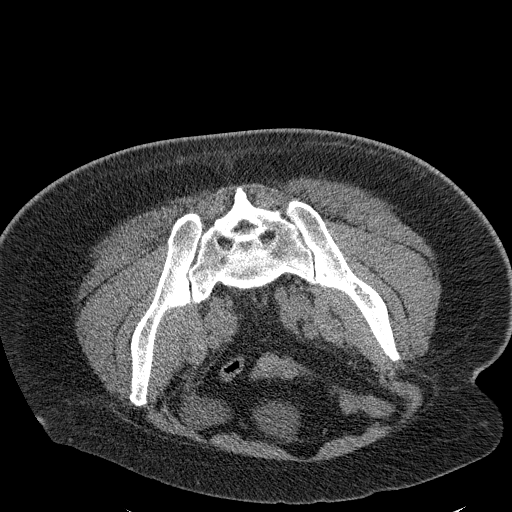
[im 10/30  soft-tissue]
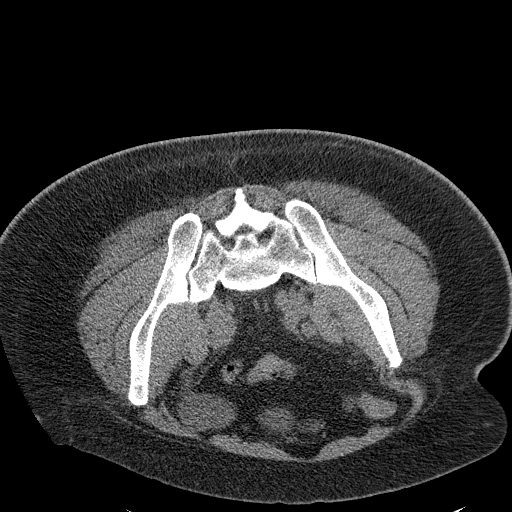
[im 13/30  soft-tissue]
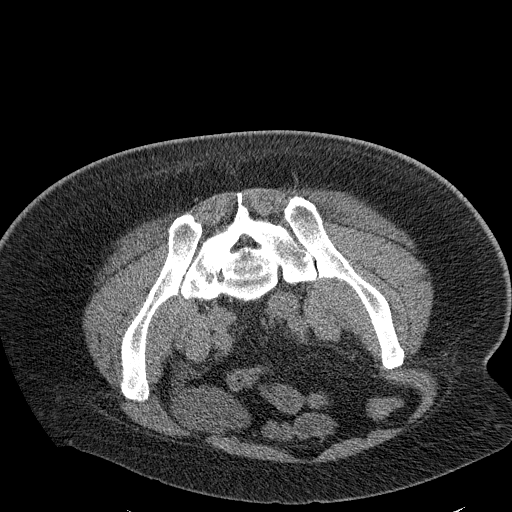
[im 15/30  soft-tissue]
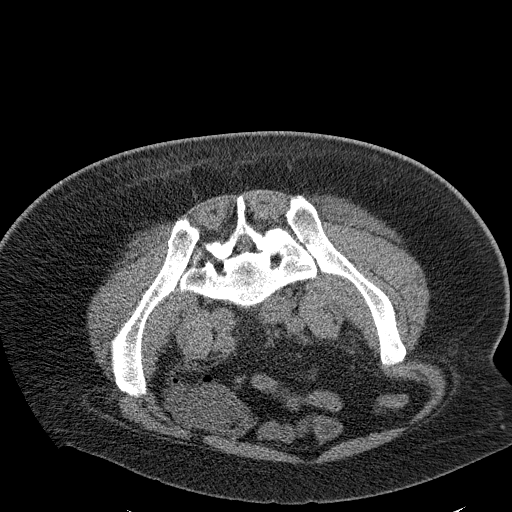
[im 17/30  soft-tissue]
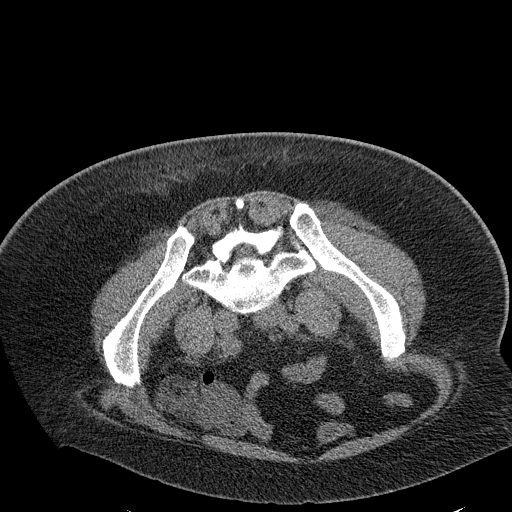
[im 20/30  soft-tissue]
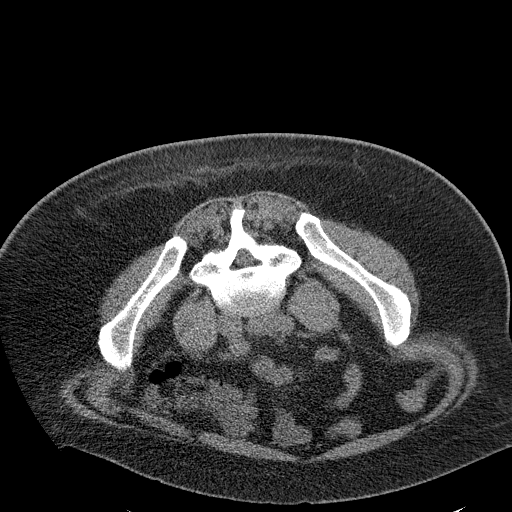
[im 20/30  bone]
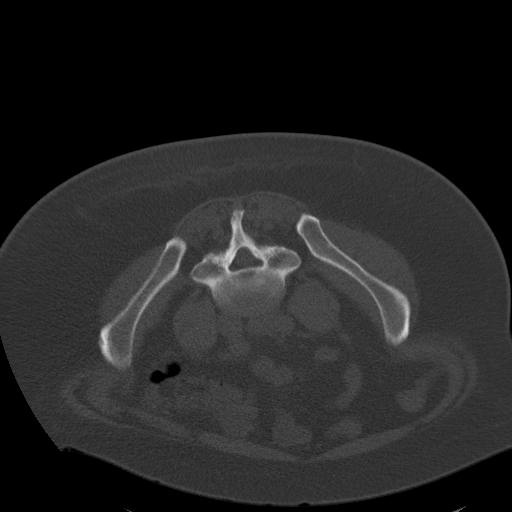
[im 21/30  soft-tissue]
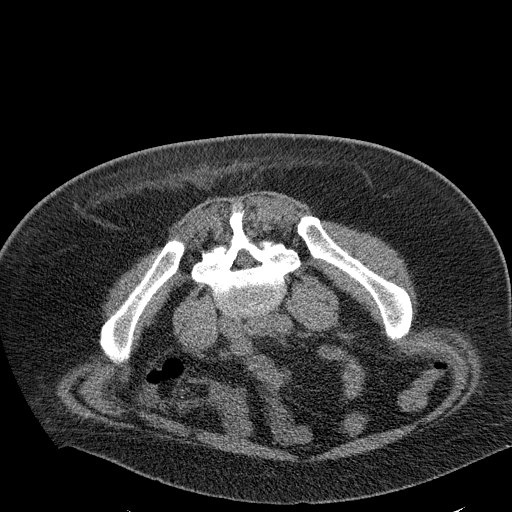
[im 23/30  soft-tissue]
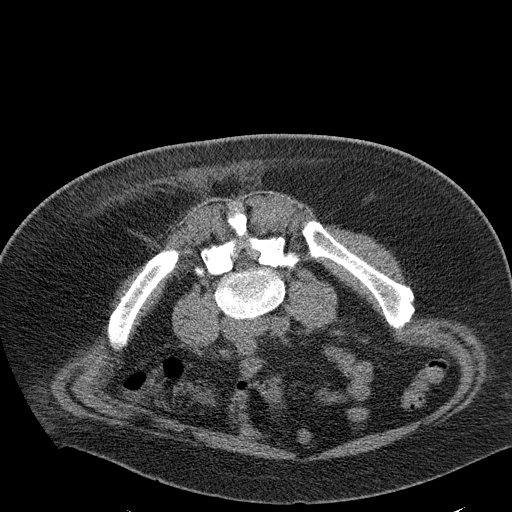
[im 25/30  lung]
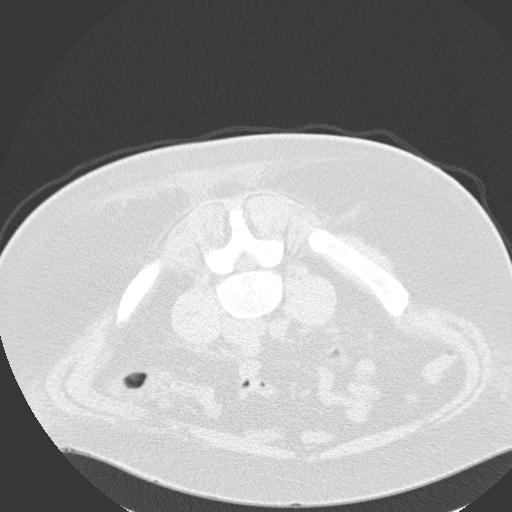
[im 26/30  soft-tissue]
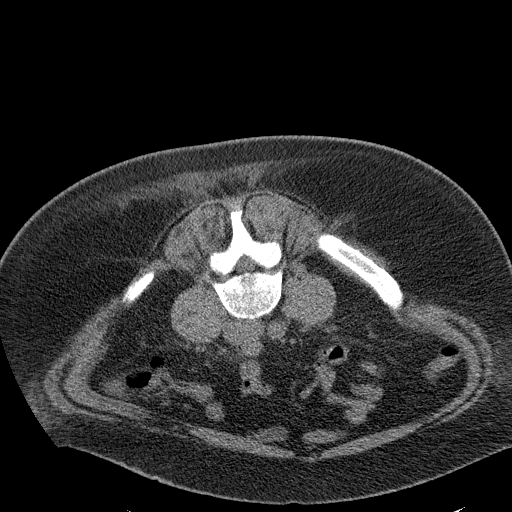
[im 26/30  lung]
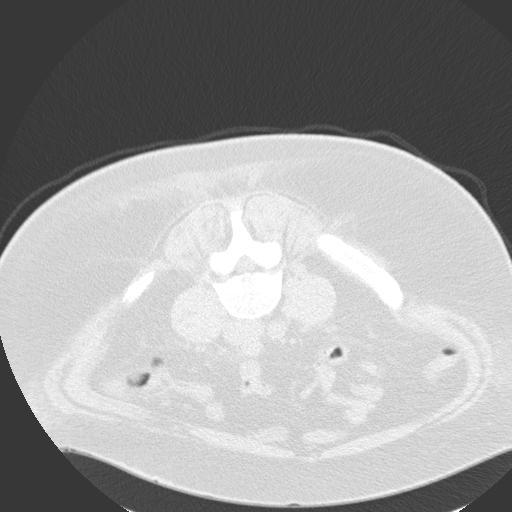
[im 27/30  lung]
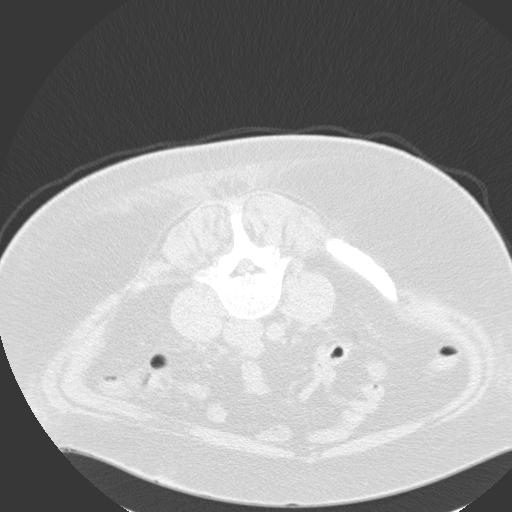
[im 28/30  soft-tissue]
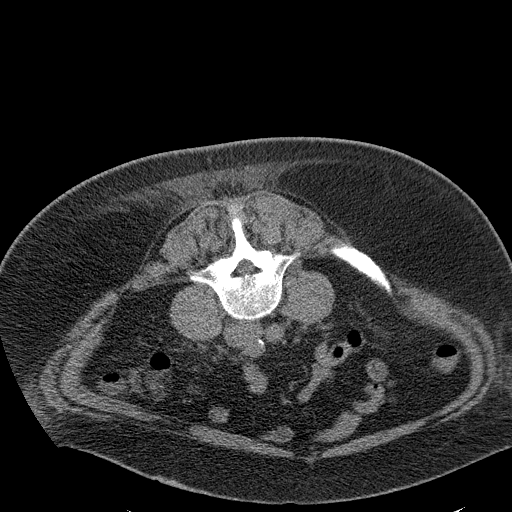
[im 28/30  lung]
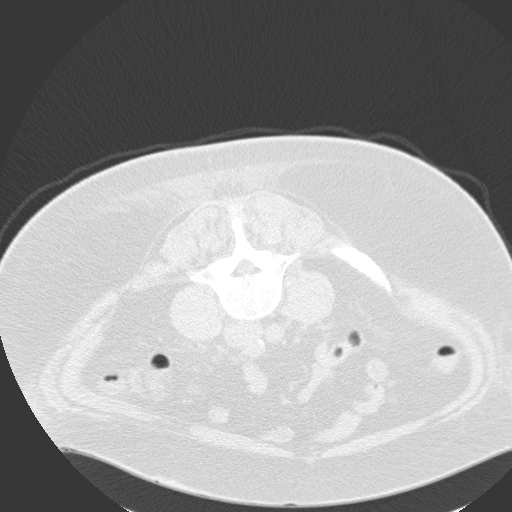

[15 of 32 positions shown; findings below may reference images not displayed]

Intravenous Fentanyl 111mcg and Versed 2mg were administered as
conscious sedation during continuous monitoring of the patient's
level of consciousness and physiological / cardiorespiratory status
by the radiology RN, with a total moderate sedation time of 10
minutes.

Under CT fluoroscopic guidance an 11-gauge Cook trocar bone needle
was advanced into the left iliac bone just lateral to the sacroiliac
joint. Once needle tip position was confirmed, core and aspiration
samples were obtained, submitted to pathology for approval. Post
procedure scans show no hematoma or fracture. Patient tolerated
procedure well.

COMPLICATIONS:
COMPLICATIONS
none
IMPRESSION: 1. Technically successful CT guided left iliac bone core and
aspiration biopsy.

## 2019-09-25 IMAGING — DX DG BONE SURVEY MET
8 of 10 series · 8 of 10 positions shown · non-contrast
Comparison: None.

CLINICAL DATA: Plasma cell neoplasm, in-patient.

EXAM:
METASTATIC BONE SURVEY

[skull lat]
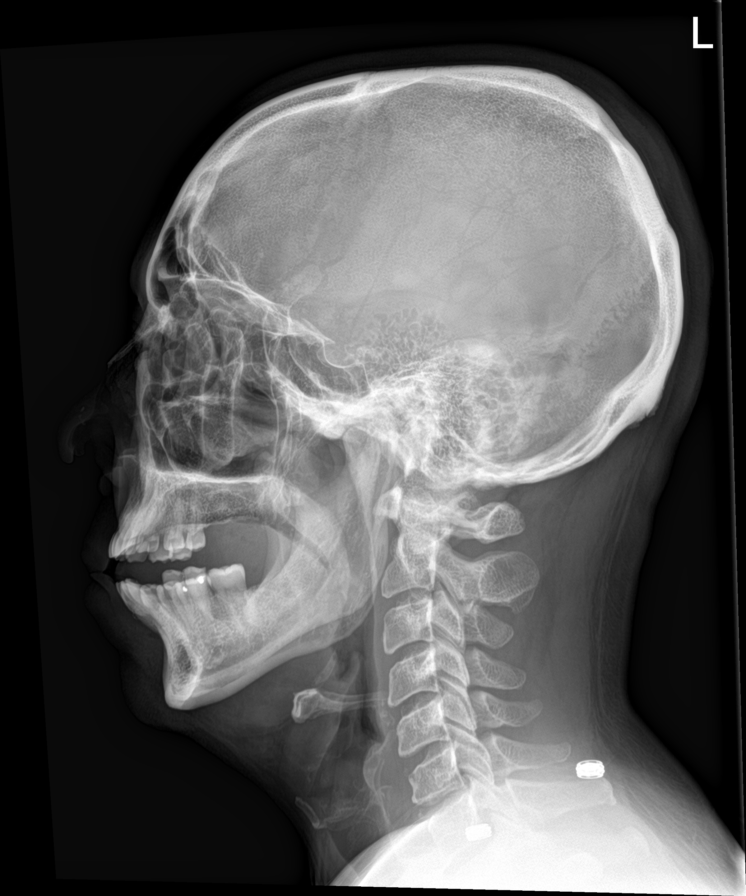

[shoulder ap (1 of 2)]
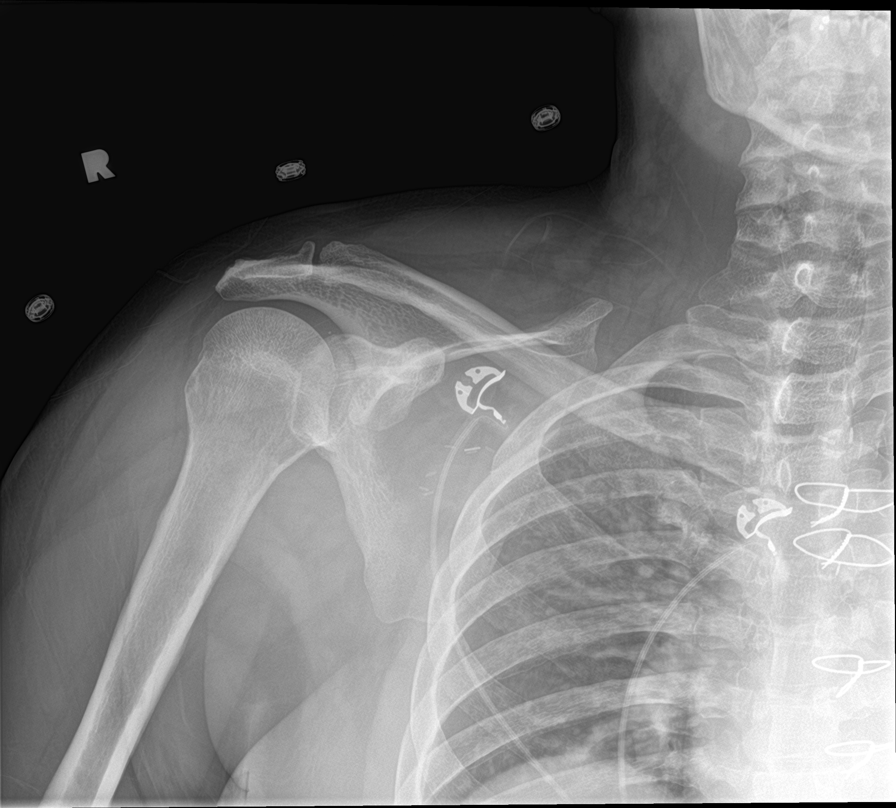

[shoulder ap (2 of 2)]
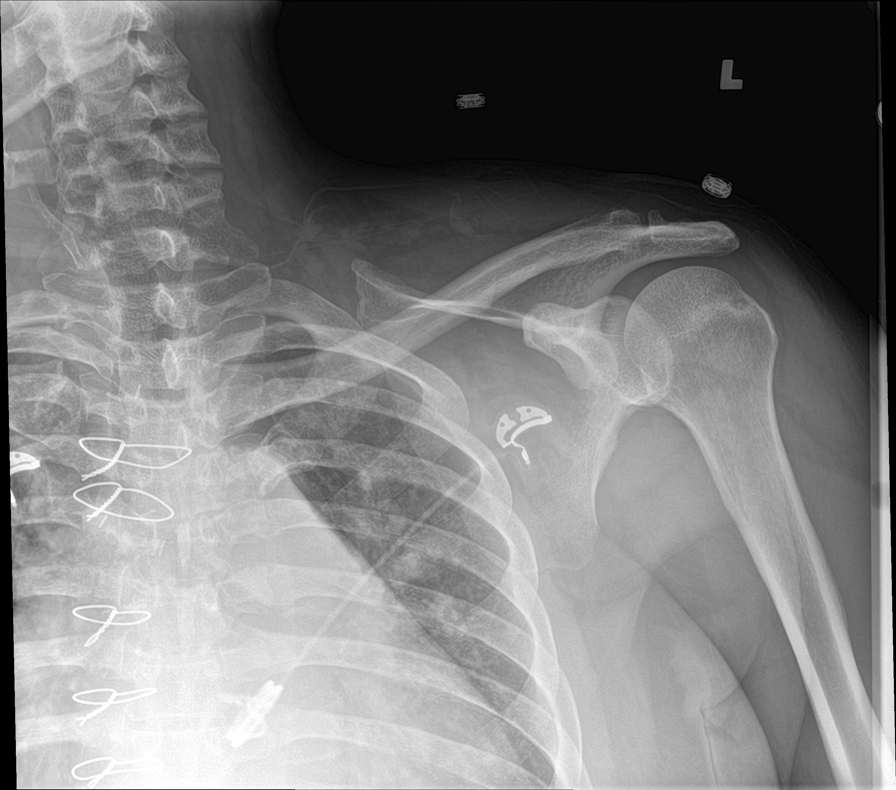

[humerus ap (1 of 2)]
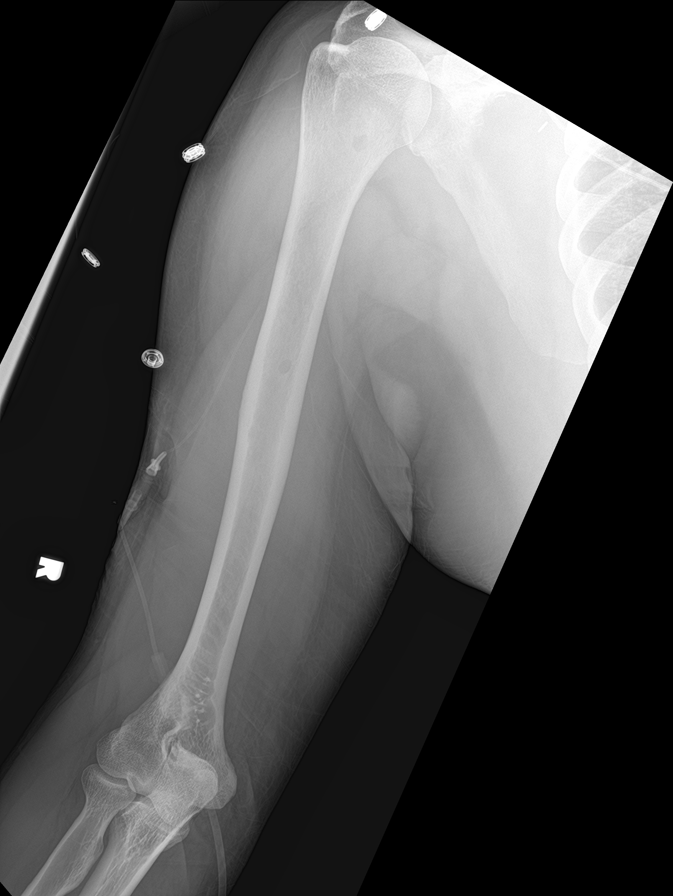

[humerus ap (2 of 2)]
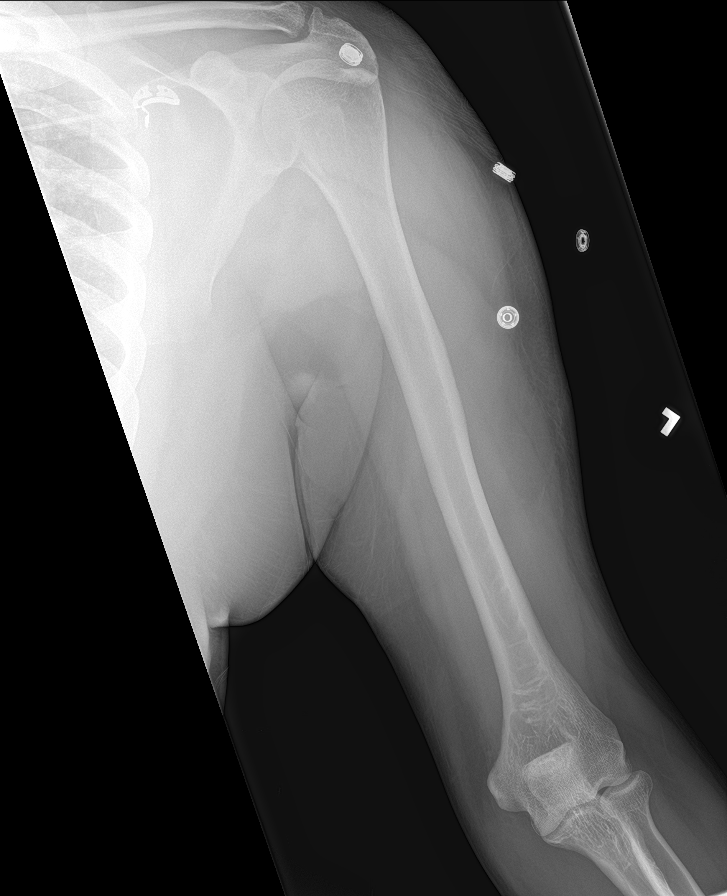

[forearm ap (1 of 2)]
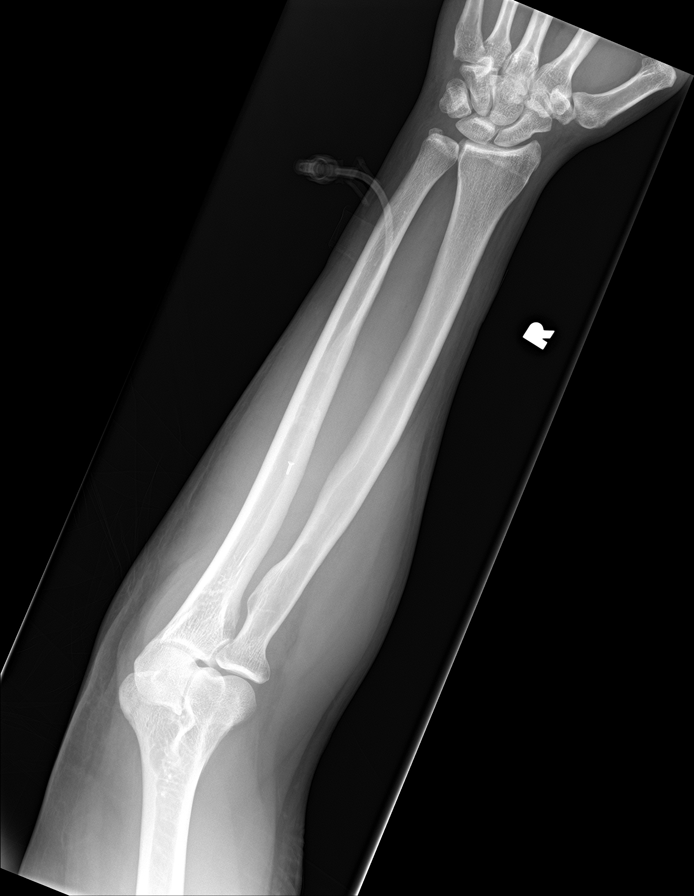

[forearm ap (2 of 2)]
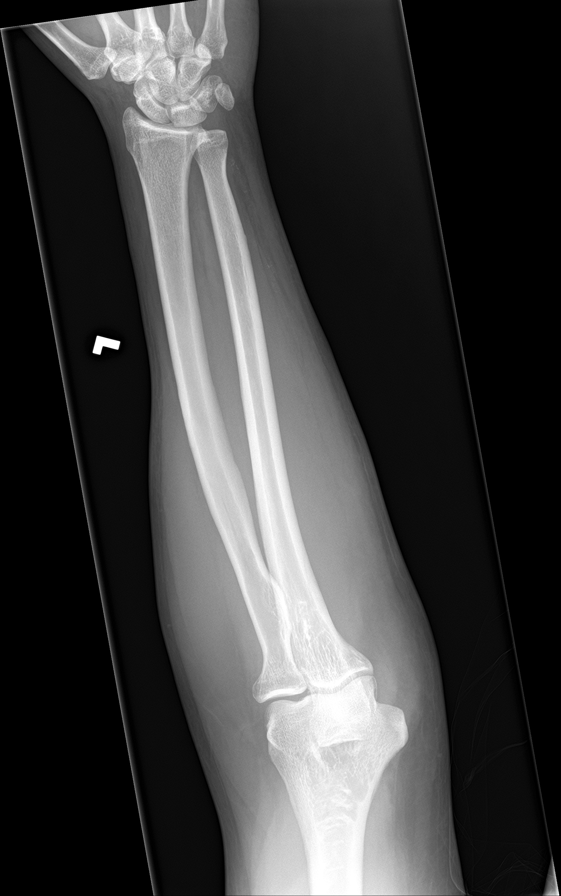

[c-spine ap]
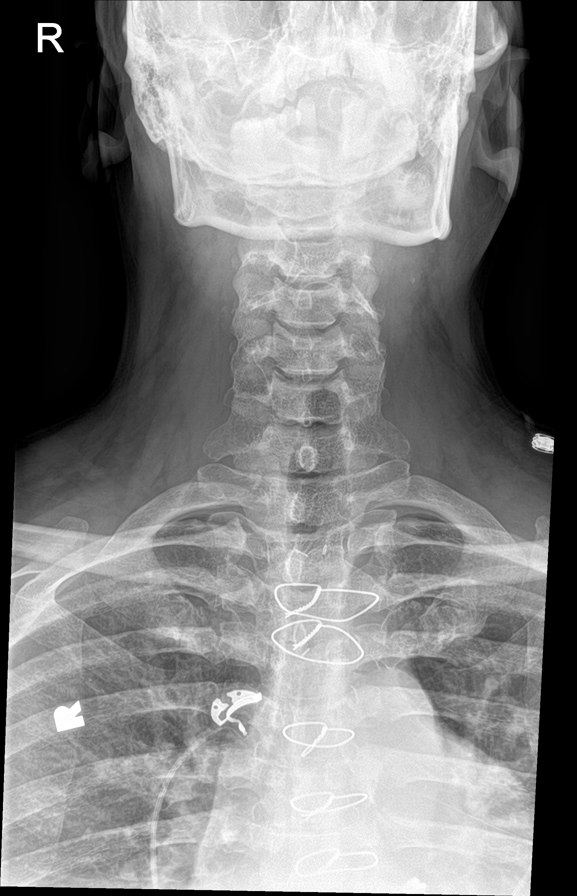

[8 of 10 positions shown; findings below may reference images not displayed]

FINDINGS: Skull: Normal bony mineralization. No focal lytic or blastic osseous
lesion.

Cervical Spine: Normal bony mineralization. No focal lytic or
blastic osseous lesion.

Thoracic Spine: Normal bony mineralization. No focal lytic or
blastic osseous lesion. T10 vertebral body lesion is better
demonstrated on recent chest CT.

Chest: Normal bony mineralization. No focal lytic or blastic osseous
lesion.

Lumbar Spine: Normal bony mineralization. No focal lytic or blastic
osseous lesion. Presumed pars interarticularis defects at the L3
vertebral body level with associated mild (grade 1)
spondylolisthesis.

Pelvis: Normal bony mineralization. No focal lytic or blastic
osseous lesion.

Right Upper Extremity: Small lytic appearing lesions within the
proximal and mid RIGHT humerus, each measuring approximately 5-6 mm
greatest dimension.

Left Upper Extremity: Normal bony mineralization. No focal lytic or
blastic osseous lesion.

Right Lower Extremity: Normal bony mineralization. No focal lytic or
blastic osseous lesion.

Left Lower Extremity: Normal bony mineralization. No focal lytic or
blastic osseous lesion.
IMPRESSION: 1. Two small lytic appearing lesions within the proximal and mid
RIGHT humerus, measuring approximately 5-6 mm greatest dimension,
suspicious for foci of metastatic disease/multiple myeloma.
2. Aggressive appearing lytic lesion within the T10 vertebral body
is better demonstrated on recent chest CT.
3. Otherwise normal bone survey. No additional evidence of
metastatic disease.
4. Presumed chronic pars interarticularis defects at the L3
vertebral body level with associated mild (grade 1)
spondylolisthesis.

## 2019-09-25 IMAGING — CR DG CHEST 2V
2 series · 2 of 2 positions shown · non-contrast
Comparison: 09/08/2019

CLINICAL DATA: Plasma cell neoplasm, preoperative evaluation

EXAM:
CHEST - 2 VIEW

[chest lat]
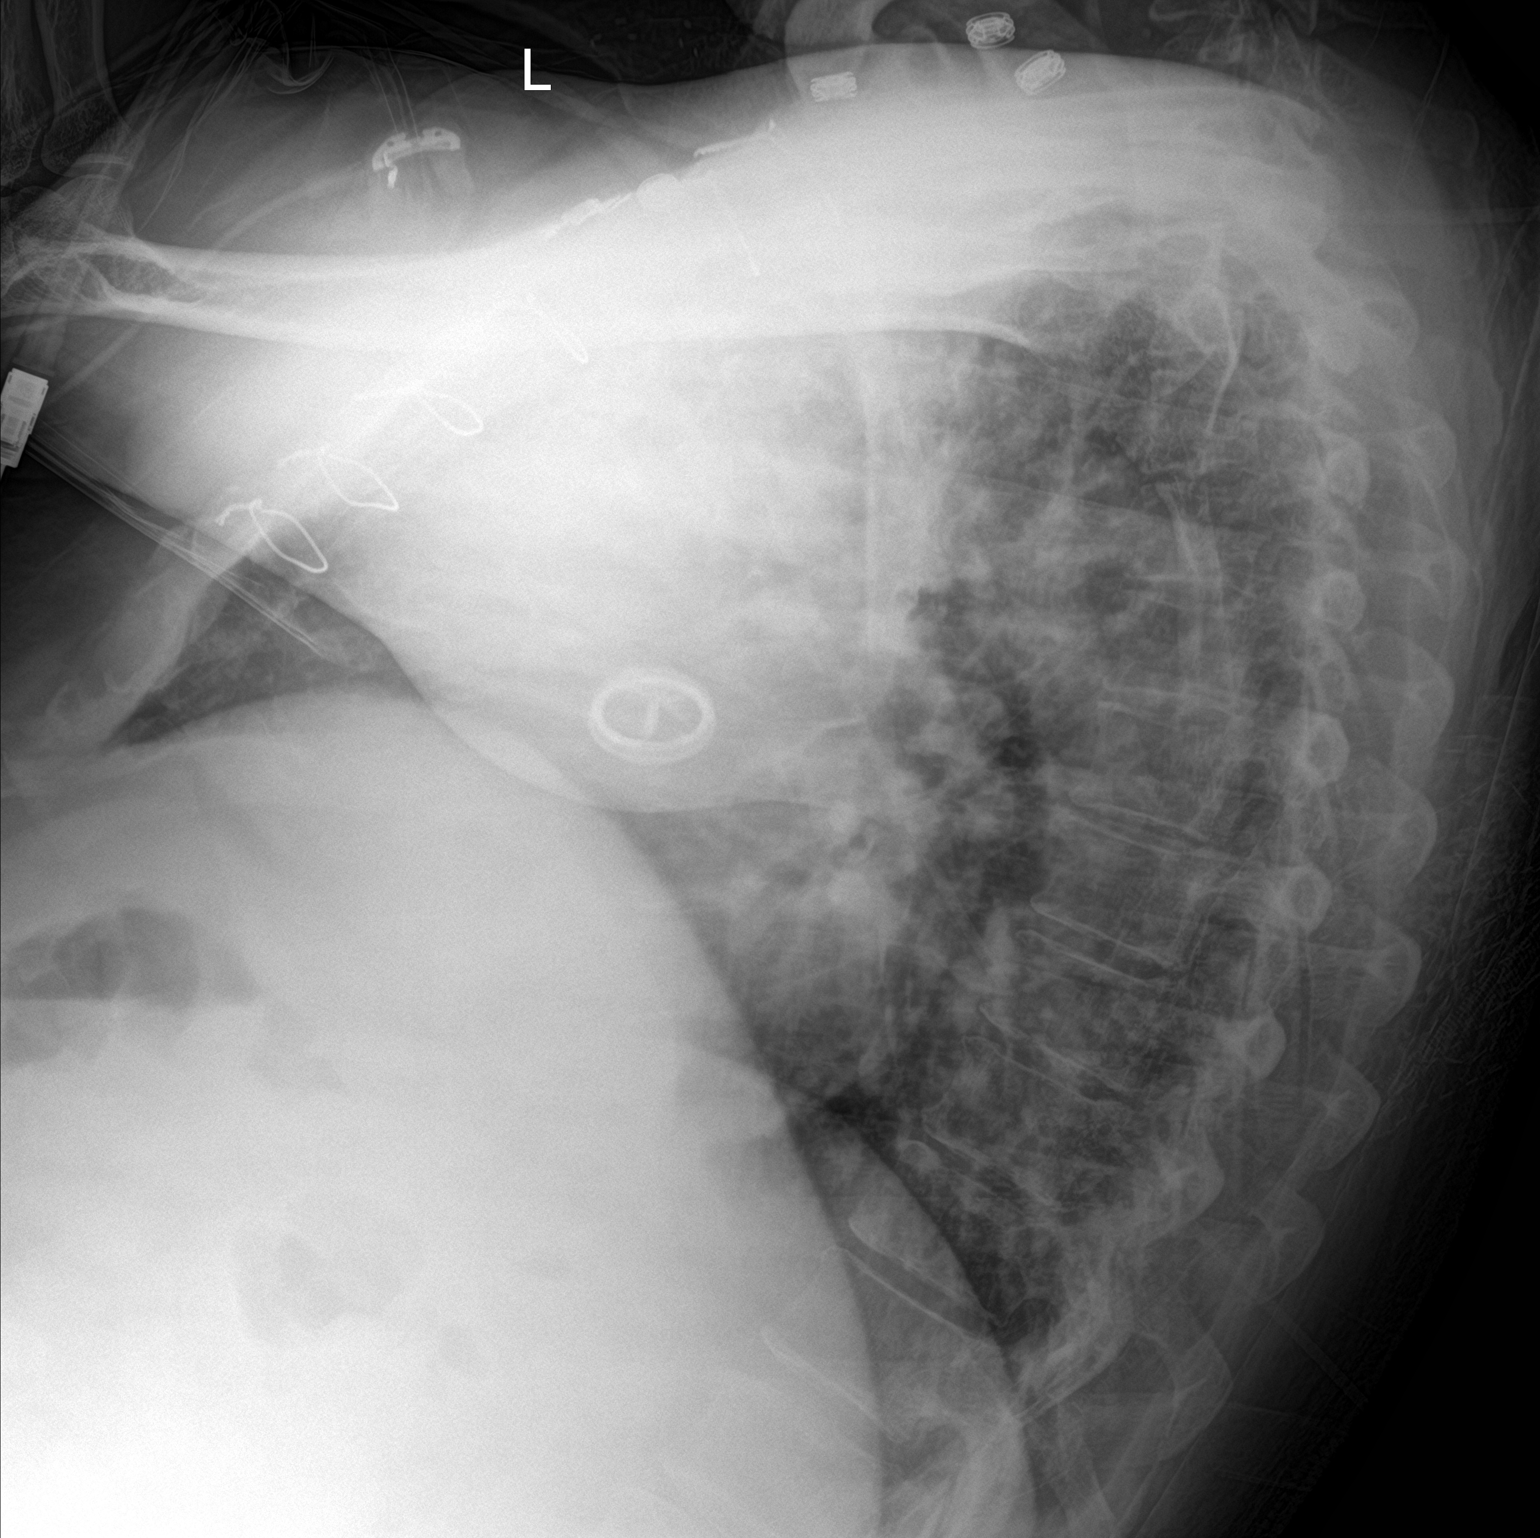

[chest ap]
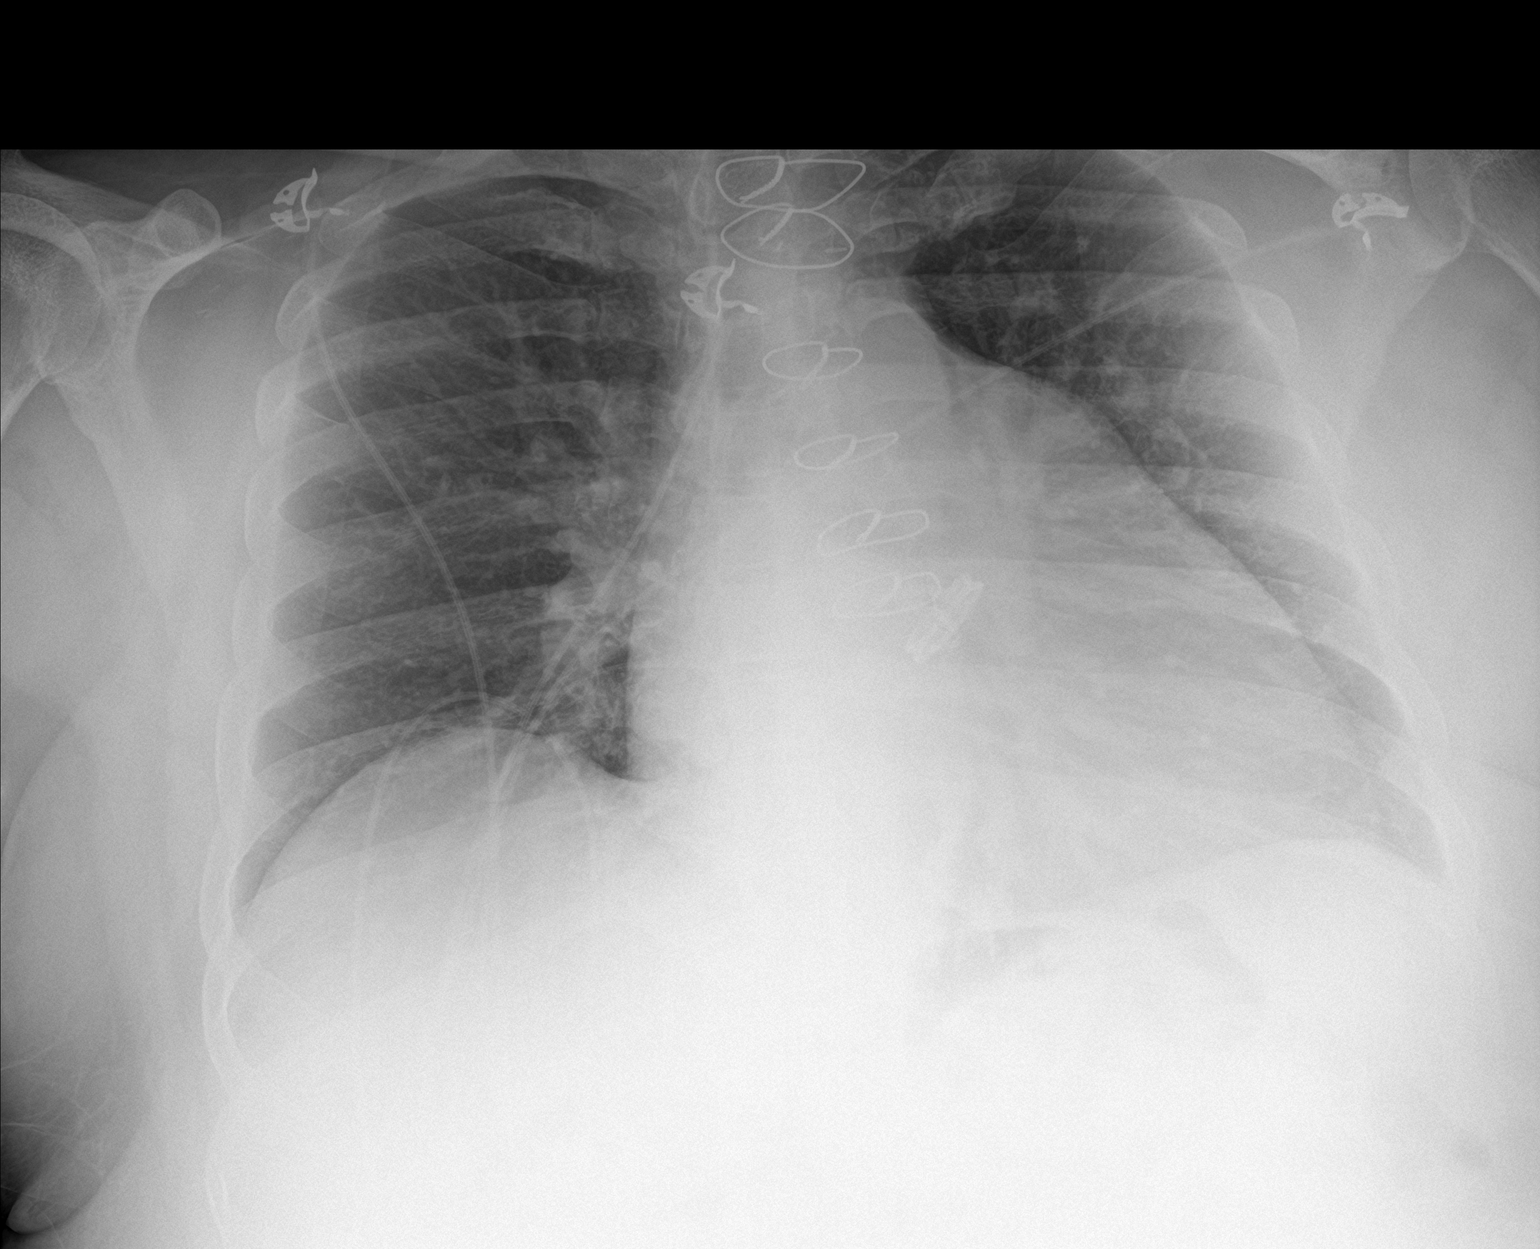

[2 of 2 positions shown; findings below may reference images not displayed]

FINDINGS: Frontal and lateral views of the chest demonstrate postsurgical
changes from aortic valve replacement. Cardiac silhouette is
enlarged. Mild chronic central vascular congestion without airspace
disease, effusion, or pneumothorax. The lytic lesion involving the
T10 vertebral body is not well appreciated on portable chest x-ray.
IMPRESSION: 1. Enlarged cardiac silhouette.
2. Chronic central vascular congestion.  No acute airspace disease.

## 2019-09-25 MED ORDER — TRANEXAMIC ACID (OHS) PUMP PRIME SOLUTION
2.0000 mg/kg | INTRAVENOUS | Status: DC
  Filled 2019-09-25: qty 2.62

## 2019-09-25 MED ORDER — FENTANYL CITRATE (PF) 100 MCG/2ML IJ SOLN
INTRAMUSCULAR | Status: AC
  Filled 2019-09-25: qty 2

## 2019-09-25 MED ORDER — MIDAZOLAM HCL 2 MG/2ML IJ SOLN
INTRAMUSCULAR | Status: AC
  Filled 2019-09-25: qty 2

## 2019-09-25 MED ORDER — MAGNESIUM SULFATE 50 % IJ SOLN
40.0000 meq | INTRAMUSCULAR | Status: DC
  Filled 2019-09-25: qty 9.85

## 2019-09-25 MED ORDER — TRANEXAMIC ACID 1000 MG/10ML IV SOLN
1.5000 mg/kg/h | INTRAVENOUS | Status: DC
  Filled 2019-09-25: qty 25

## 2019-09-25 MED ORDER — PHENYLEPHRINE HCL-NACL 20-0.9 MG/250ML-% IV SOLN
30.0000 ug/min | INTRAVENOUS | Status: DC
  Filled 2019-09-25: qty 250

## 2019-09-25 MED ORDER — CHLORHEXIDINE GLUCONATE CLOTH 2 % EX PADS
6.0000 | MEDICATED_PAD | Freq: Once | CUTANEOUS | Status: AC
  Administered 2019-09-25: 6 via TOPICAL

## 2019-09-25 MED ORDER — CARVEDILOL 3.125 MG PO TABS
3.1250 mg | ORAL_TABLET | Freq: Two times a day (BID) | ORAL | Status: DC
  Administered 2019-09-25: 3.125 mg via ORAL
  Filled 2019-09-25: qty 1

## 2019-09-25 MED ORDER — NITROGLYCERIN IN D5W 200-5 MCG/ML-% IV SOLN
2.0000 ug/min | INTRAVENOUS | Status: DC
  Filled 2019-09-25: qty 250

## 2019-09-25 MED ORDER — BISACODYL 5 MG PO TBEC
5.0000 mg | DELAYED_RELEASE_TABLET | Freq: Once | ORAL | Status: AC
  Administered 2019-09-25: 5 mg via ORAL
  Filled 2019-09-25: qty 1

## 2019-09-25 MED ORDER — FENTANYL CITRATE (PF) 100 MCG/2ML IJ SOLN
INTRAMUSCULAR | Status: AC | PRN
  Administered 2019-09-25 (×2): 50 ug via INTRAVENOUS

## 2019-09-25 MED ORDER — SODIUM CHLORIDE 0.9 % IV SOLN
750.0000 mg | INTRAVENOUS | Status: DC
  Filled 2019-09-25: qty 750

## 2019-09-25 MED ORDER — PLASMA-LYTE 148 IV SOLN
INTRAVENOUS | Status: DC
  Filled 2019-09-25: qty 2.5

## 2019-09-25 MED ORDER — SODIUM CHLORIDE 0.9 % IV SOLN
1.5000 g | INTRAVENOUS | Status: DC
  Filled 2019-09-25: qty 1.5

## 2019-09-25 MED ORDER — VANCOMYCIN HCL 1500 MG/300ML IV SOLN
1500.0000 mg | INTRAVENOUS | Status: DC
  Filled 2019-09-25: qty 300

## 2019-09-25 MED ORDER — LIDOCAINE HCL 1 % IJ SOLN
INTRAMUSCULAR | Status: AC
  Filled 2019-09-25: qty 20

## 2019-09-25 MED ORDER — EPINEPHRINE HCL 5 MG/250ML IV SOLN IN NS
0.0000 ug/min | INTRAVENOUS | Status: DC
  Filled 2019-09-25: qty 250

## 2019-09-25 MED ORDER — NOREPINEPHRINE 4 MG/250ML-% IV SOLN
0.0000 ug/min | INTRAVENOUS | Status: DC
  Filled 2019-09-25: qty 250

## 2019-09-25 MED ORDER — TEMAZEPAM 15 MG PO CAPS: 15.0000 mg | ORAL_CAPSULE | Freq: Once | ORAL | Status: DC | PRN

## 2019-09-25 MED ORDER — TRANEXAMIC ACID (OHS) BOLUS VIA INFUSION
15.0000 mg/kg | INTRAVENOUS | Status: DC
  Filled 2019-09-25: qty 1964

## 2019-09-25 MED ORDER — DEXMEDETOMIDINE HCL IN NACL 400 MCG/100ML IV SOLN
0.1000 ug/kg/h | INTRAVENOUS | Status: DC
  Filled 2019-09-25: qty 100

## 2019-09-25 MED ORDER — CHLORHEXIDINE GLUCONATE 0.12 % MT SOLN
15.0000 mL | Freq: Once | OROMUCOSAL | Status: AC
  Administered 2019-09-26: 15 mL via OROMUCOSAL
  Filled 2019-09-25: qty 15

## 2019-09-25 MED ORDER — HEPARIN (PORCINE) 25000 UT/250ML-% IV SOLN
1900.0000 [IU]/h | INTRAVENOUS | Status: DC
  Administered 2019-09-25 (×2): 1900 [IU]/h via INTRAVENOUS
  Filled 2019-09-25: qty 250

## 2019-09-25 MED ORDER — MIDAZOLAM HCL 2 MG/2ML IJ SOLN
INTRAMUSCULAR | Status: AC | PRN
  Administered 2019-09-25 (×2): 1 mg via INTRAVENOUS

## 2019-09-25 MED ORDER — SODIUM CHLORIDE 0.9 % IV SOLN
INTRAVENOUS | Status: DC
  Filled 2019-09-25: qty 30

## 2019-09-25 MED ORDER — INSULIN REGULAR(HUMAN) IN NACL 100-0.9 UT/100ML-% IV SOLN
INTRAVENOUS | Status: DC
  Filled 2019-09-25: qty 100

## 2019-09-25 MED ORDER — CHLORHEXIDINE GLUCONATE CLOTH 2 % EX PADS: 6.0000 | MEDICATED_PAD | Freq: Once | CUTANEOUS | Status: DC

## 2019-09-25 MED ORDER — POTASSIUM CHLORIDE 2 MEQ/ML IV SOLN
80.0000 meq | INTRAVENOUS | Status: DC
  Filled 2019-09-25: qty 40

## 2019-09-25 MED ORDER — MILRINONE LACTATE IN DEXTROSE 20-5 MG/100ML-% IV SOLN
0.3000 ug/kg/min | INTRAVENOUS | Status: DC
  Filled 2019-09-25: qty 100

## 2019-09-25 MED ORDER — METOPROLOL TARTRATE 12.5 MG HALF TABLET
12.5000 mg | ORAL_TABLET | Freq: Once | ORAL | Status: AC
  Administered 2019-09-26: 12.5 mg via ORAL
  Filled 2019-09-25: qty 1

## 2019-09-25 NOTE — Progress Notes (Signed)
2 Days Post-Op Procedure(s): Cancelled Procedure per Dr. Cyndia Bent Subjective: No complaints. No further bleeding from mouth. He has CT guided bone marrow bx today.  Objective: Vital signs in last 24 hours: Temp:  [97.8 F (36.6 C)-99.2 F (37.3 C)] 98.4 F (36.9 C) (05/05 1458) Pulse Rate:  [84-100] 100 (05/05 1458) Cardiac Rhythm: Atrial fibrillation (05/05 0920) Resp:  [12-19] 15 (05/05 1458) BP: (107-141)/(73-99) 140/99 (05/05 1458) SpO2:  [97 %-100 %] 98 % (05/05 1458) Weight:  [130.9 kg] 130.9 kg (05/05 0659)  Hemodynamic parameters for last 24 hours:    Intake/Output from previous day: 05/04 0701 - 05/05 0700 In: 603 [P.O.:600; I.V.:3] Out: 1050 [Urine:1050] Intake/Output this shift: No intake/output data recorded.    Lab Results: Recent Labs    09/24/19 0427 09/25/19 0533  WBC 6.4 5.4  HGB 8.8* 8.5*  HCT 25.5* 24.7*  PLT 175 151   BMET:  Recent Labs    09/24/19 0427 09/25/19 0533  NA 139 139  K 3.6 3.6  CL 110 110  CO2 22 22  GLUCOSE 155* 141*  BUN 14 12  CREATININE 1.30* 1.27*  CALCIUM 9.1 8.7*    PT/INR:  Recent Labs    09/25/19 0533  LABPROT 14.8  INR 1.2   ABG    Component Value Date/Time   PHART 7.414 09/16/2019 0936   HCO3 23.4 09/16/2019 0936   HCO3 22.8 09/16/2019 0936   TCO2 24 09/16/2019 0936   TCO2 24 09/16/2019 0936   ACIDBASEDEF 1.0 09/16/2019 0936   ACIDBASEDEF 2.0 09/16/2019 0936   O2SAT 72.0 09/16/2019 0936   O2SAT 99.0 09/16/2019 0936   CBG (last 3)  Recent Labs    09/24/19 2128 09/25/19 0748 09/25/19 1103  GLUCAP 97 127* 107*    Assessment/Plan:  He is stable for cardiac surgery tomorrow. He and wife have no further questions.  LOS: 17 days    Gaye Pollack 09/25/2019

## 2019-09-25 NOTE — Progress Notes (Signed)
Progress Note  Patient Name: [name] Date of Encounter: 09/25/2019  Primary Cardiologist: Kirk Ruths, MD   Subjective   No CP or SOB. Concerned about ?CA in his back, but hopes it can be easily treated.   Inpatient Medications    Scheduled Meds: . aspirin EC  81 mg Oral Daily  . atorvastatin  40 mg Oral Q breakfast  . colchicine  0.6 mg Oral BID  . feeding supplement  1 Container Oral BID BM  . feeding supplement (ENSURE ENLIVE)  237 mL Oral BID BM  . feeding supplement (PRO-STAT SUGAR FREE 64)  30 mL Oral BID  . gabapentin  300 mg Oral Q breakfast  . insulin aspart  0-15 Units Subcutaneous TID WC  . insulin aspart  0-5 Units Subcutaneous QHS  . insulin aspart  6 Units Subcutaneous TID WC  . insulin detemir  6 Units Subcutaneous BID  . levothyroxine  50 mcg Oral Q0600  . multivitamin with minerals  1 tablet Oral Daily  . polyethylene glycol  17 g Oral BID  . sodium chloride flush  10-40 mL Intracatheter Q12H  . sodium chloride flush  3 mL Intravenous Once  . sodium chloride flush  3 mL Intravenous Q12H   Continuous Infusions: . sodium chloride    . cefTRIAXone (ROCEPHIN)  IV 2 g (09/24/19 0955)  . gentamicin 220 mg (09/24/19 1324)  . heparin 1,900 Units/hr (09/24/19 2353)  . lactated ringers     PRN Meds: sodium chloride, acetaminophen **OR** acetaminophen, HYDROcodone-acetaminophen, nitroGLYCERIN, ondansetron **OR** ondansetron (ZOFRAN) IV, sodium chloride flush, sodium chloride flush   Vital Signs    Vitals:   09/23/19 2052 09/24/19 0559 09/24/19 2023 09/25/19 0659  BP:  130/88 107/73 (!) 141/95  Pulse:  96 91 85  Resp:  16 19 17   Temp:  99.4 F (37.4 C) 99.2 F (37.3 C) 98.4 F (36.9 C)  TempSrc:  Oral Oral Oral  SpO2: 100% 97% 97% 100%  Weight:  130.6 kg  130.9 kg  Height:        Intake/Output Summary (Last 24 hours) at 09/25/2019 0717 Last data filed at 09/24/2019 2208 Gross per 24 hour  Intake 603 ml  Output 450 ml  Net 153 ml   Last  3 Weights 09/25/2019 09/24/2019 09/23/2019  Weight (lbs) 288 lb 8 oz 287 lb 14.4 oz 288 lb 11.2 oz  Weight (kg) 130.863 kg 130.591 kg 130.953 kg      Telemetry    SR w/ freq PACs, brief SVT - Personally Reviewed  ECG    No new tracing.   Physical Exam   GEN: No acute distress.   Neck: No JVD Cardiac: RRR, +valve click, 2/6 murmurs, rubs, or gallops.  Respiratory: clear bilaterally. GI: Soft, nontender, non-distended  MS: No edema; No deformity. Neuro:  Nonfocal  Psych: Normal affect   Labs    High Sensitivity Troponin:   Recent Labs  Lab 09/08/19 0810 09/08/19 1100  TROPONINIHS 34* 28*      Chemistry Recent Labs  Lab 09/23/19 0458 09/24/19 0427 09/25/19 0533  NA 139 139 139  K 3.6 3.6 3.6  CL 108 110 110  CO2 24 22 22   GLUCOSE 142* 155* 141*  BUN 12 14 12   CREATININE 1.30* 1.30* 1.27*  CALCIUM 9.0 9.1 8.7*  GFRNONAA >60 >60 >60  GFRAA >60 >60 >60  ANIONGAP 7 7 7      Hematology Recent Labs  Lab 09/23/19 0458 09/24/19 0427 09/25/19 0533  WBC  8.1 6.4 5.4  RBC 3.19* 3.01* 2.89*  HGB 9.3* 8.8* 8.5*  HCT 26.7* 25.5* 24.7*  MCV 83.7 84.7 85.5  MCH 29.2 29.2 29.4  MCHC 34.8 34.5 34.4  RDW 16.0* 16.3* 16.7*  PLT 199 175 151    Radiology    No results found.  Cardiac Studies   RIGHT HEART CATH AND CORONARY/GRAFT ANGIOGRAPHY 09/16/19  Conclusion    Ost RCA to Prox RCA lesion is 100% stenosed.  SVG to RVA with Origin lesion is 80% stenosed in stent.  Hemodynamic findings consistent with mild pulmonary hypertension.   1. Single vessel occlusive CAD with CTO of the ostium of the RCA 2. SVG to RCA with 80% in stent stenosis at the ostium. The RCA is a large vessel. 3. Mildly elevated LV filling pressures 4. Mild pulmonary HTN 5. Normal cardiac output.   Plan: Redo AVR and CABG to RCA.    TEE 09/11/19 1. Left ventricular ejection fraction, by estimation, is 55 to 60%. The  left ventricle has normal function. The left ventricle has no regional   wall motion abnormalities. There is mild concentric left ventricular  hypertrophy.  2. Right ventricular systolic function is normal. The right ventricular  size is mildly enlarged. There is mildly elevated pulmonary artery  systolic pressure. The estimated right ventricular systolic pressure is  [id number] mmHg.  3. It appears that the left atrial appendage has been surgically resected  or oversown. Left atrial size was severely dilated. No left atrial/left  atrial appendage thrombus was detected. The LAA emptying velocity was 35  cm/s.  4. Right atrial size was severely dilated.  5. The mitral valve is normal in structure. Mild mitral valve  regurgitation.  6. There is a large, moderately mobile vegetation attached to the septal  leaflet of the tricuspid valve, measuring 27 mm in length and 11 mm in  width. It appears heterogeneous and friable.. Tricuspid valve  regurgitation is moderate.  7. There is a large vegetation attached to the medial aspect of posterior  disc or the posteromedial mechanical valve annulus. It is highly mobile  and prolapses readily across the valve orifice. It measures 15 mm in  length and 6 mm in width. Although  there is no evidence of a periannular abscess cavity, the periannular  tissue echodensity is heterogeneous, raining concern for  inflammation/early abscess formation. The proximity of the aortic and the  tricuspid vegetation to the same area of the  interventricular septum also raises concern for intramyocardial abscess or  fistula formation. The aortic valve has been repaired/replaced. Aortic  valve regurgitation is trivial. Mild aortic valve stenosis. There is a  unknown [employer] tilting disk valve  present in the aortic position. Procedure Date: 2000. Aortic valve mean  gradient measures 20.0 mmHg.  8. There is mild (Grade II) atheroma plaque involving the transverse  aorta.    TTE 09/09/19 1. Tricuspid valve vegetation. Discussed with  referring provider.  2. Left ventricular ejection fraction, by estimation, is 55 to 60%. The  left ventricle has normal function. The left ventricle has no regional  wall motion abnormalities. Left ventricular diastolic parameters were  normal.  3. Right ventricular systolic function is normal. The right ventricular  size is mildly enlarged. There is normal pulmonary artery systolic  pressure.  4. Left atrial size was moderately dilated.  5. Right atrial size was severely dilated.  6. The mitral valve is normal in structure. Mild mitral valve  regurgitation. No evidence of mitral stenosis.  7. There is a 1.8 x 1.1 cm tricuspid valve mass on the atrial surface  consistent with vegetation. . Tricuspid valve regurgitation is moderate.  8. Transaortic velocity is higher than expected for mechanical aortic  valve. The aortic valve is normal in structure. Aortic valve regurgitation  is not visualized. No aortic stenosis is present. There is a mechanical  valve present in the aortic  position. Procedure Date: 2000. Aortic valve mean gradient measures 33.5  mmHg. Aortic valve Vmax measures 3.68 m/s.  9. Aortic root/ascending aorta has been repaired/replaced and Bentall.  10. The inferior vena cava is normal in size with greater than 50%  respiratory variability, suggesting right atrial pressure of 3 mmHg.    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 Additional issues:lytic lesion in his thoracic spine (T10), s/p biopsy on 04/28.Numerous dental problems, likely source of endocarditis, s/pdentalextractions 04/27.Gluteal area furuncle, spontaneously drained 4/23, currently packed. Steadily lengthening PR interval on ECG raises concern for  periannular abscess formation.  Cardiac catheterization this admission shows occluded right coronary artery and 80% stenosis in the saphenous vein graft to the RCA. Transesophageal echocardiogram showed normal LV function, severe right atrial enlargement, mild mitral regurgitation, large vegetation on the tricuspid valve with moderate tricuspid regurgitation, large vegetation on the prosthetic aortic valve without evidence of periannular abscess, trace aortic insufficiency.  Assessment & Plan    1. Endocarditis  - Dr Cyndia Bent following>>OR 05/06 for CABG/AVR - ABX per ID/IM   2. CAD  - Cath results above, SVG-RCA 80%, CTO RCA - regraft RCA - cont ASA, statin - will try adding low-dose Coreg  3. PAF - maintaining SR  4. CKD III - Cr stable  5. T10 vertebral lesion - s/p bx today, Dr Annette Stable has seen  - s/p CT guided L iliac BMX   For questions or updates, please contact [place] Please consult [email or web address] for contact info under        Signed, Rosaria Ferries, PA-C  09/25/2019, 7:17 AM

## 2019-09-25 NOTE — Progress Notes (Signed)
PROGRESS NOTE    [name]  VEH:[id number] DOB: May 09, 1966 DOA: 09/08/2019 PCP: Biagio Borg, MD   Brief Narrative:  54 y.o.malewith history of CAD s/p CABG in 2000, SVG-RCA PCI with BMS in Feb 2015, he had re-do PCI in June 2019 DES to SVG graft, ascending aortic dissection in 2000 s/p repair with AVR on warfarin. Paroxysmal A. fib. NIDDM-2, HTN and HLD presenting with 5 days of chills, nausea, vomiting, diarrhea and dyspnea on exertion and orthopnea mainly with lying on the left side. He's been found to have infective endocarditis with strep infantarius bacteremia.  He had a TEE showing large vegetations on the septal leaflet of the tricuspid valve and on the posterior disc of the aortic valve.  He's on ceftriaxone/gentamicin for his endocarditis at this point.  ID and cardiology are following.  He was seen by dental medicine 4/27 for multiple tooth extraction, alveoloplasty, debridement.  He's also been seen by surgery for a left buttocks abscess which has spontaneously drained and by orthopedics for L MTP pain and swelling which is thought to be gout.    Incidental T10 lesion finding, biopsy performed on 4/28 showed plasma cell neoplasm.  Oncology consulted. Dr. Cyndia Bent from [facility] planning for surgery on Thursday 5/6.  Assessment & Plan:   Principal Problem:   Suspected endocarditis mechanical aortic valve Active Problems:   Diabetes mellitus type II, non insulin dependent (HCC)   Hyperlipidemia   Essential hypertension   PAF (paroxysmal atrial fibrillation) (HCC)   Hypothyroidism   Normocytic anemia   Shortness of breath   Obesity (BMI 30-39.9)   CAD (coronary artery disease)   Nausea vomiting and diarrhea   Streptococcal bacteremia   Endocarditis of tricuspid valve   Plasmacytoma (HCC)   Infective endocarditis -tricuspid vegetation with regurgitation strep Infantarius Bacteremia:  - Per infectious disease continue gentamycin/ceftriaxone given prosthetic heart  valve - will likely need to 'restart' antibiotic clock post operatively. - CT surgery plans for redo sternotomy, aortic root replacement planned 5/6. - Continue heparin drip, lower target 0.3.  Back pain fluctuant mass around mid back, hematoma Lytic lesion at T10, plasma cell neoplasm - CT chest performed, no evidence of active bleeding.   - New diagnosis, incidental finding of T10 lesion on MRI underwent biopsy 4/28 resulted on 5/3 evening.  Oncology team following for further work-up. - Per Oncology - whole body skeletal bone survey/bone marrow biopsy and aspirate by interventional radiology/myeloma panel pending  Bleeding gums with poor dentition status post tooth extraction -Subsided, back on heparin drip. Seen by dentist.  Left Buttocks Abscess: Spontaneous drainage.  Seen by general surgery.  Flagyl discontinued 4/29.  Routine dressing per surgery and wound care  Left MTP Pain, concerning for gout continue colchicine.  Seen by orthopedic no further recommendations.  Iron Def Anemia:  Iron supplements ongoing B12, folate-normal  CAD s/p CABG in 2000, SVG-RCA PCI with BMS in Feb 2015, he had re-do PCI in June 2019 DES to SVG graft: Echocardiogram as above. -Management per cardiology and CT surgery  Ascending aortic dissection in 2000 s/p repair with Mechanical AVR on warfarin.  -On Heparin -Monitor fluid status and renal function.  Paroxysmal A. fib.  -Continue heparin drip.  Uncontrolled NIDDM-2 with hyperglycemia: A1c 8.2%. -ContinueSSI-moderate -Levemir 6 units twice daily, NovoLog 6 units 3 times daily -Continue statin.   CKD stage IIIa (vancomycin, Lasix and losartan) -Baseline creatinine 1.2-1.3. -Holding metoprolol, Lasix and losartan -Continue monitoring  Essential HTN: -As needed as needed.  Currently  home meds on hold  HLD  -Continue atorvastatin.  Hypothyroidism: -Continue home Synthroid.  Morbid obesity: - Body mass index is 36.03  kg/m.With comorbidities as above. - Encourage lifestyle change to lose weight.  Would benefit from outpatient sleep study   DVT prophylaxis: Heparin drip, currently paused Code Status: Full code Family Communication: None at bedside Disposition Plan:   Patient From= home  Patient Anticipated D/C place= to be determined  Barriers= plan CT surgery but currently postponed due to, bleeding, maintain hospital stay  Subjective: No acute issues/events overnight - tolerated biopsy well, denies chest pain, shortness of breath, headache, fevers, chills.  Examination: Constitutional: Not in acute distress, gum bleeding has subsided. Respiratory: Clear to auscultation bilaterally Cardiovascular: Normal sinus rhythm, no rubs Abdomen: Nontender nondistended good bowel sounds Musculoskeletal: No edema noted Skin: No rashes seen Neurologic: CN 2-12 grossly intact.  And nonfocal Psychiatric: Normal judgment and insight. Alert and oriented x 3. Normal mood.  Objective: Vitals:   09/23/19 2052 09/24/19 0559 09/24/19 2023 09/25/19 0659  BP:  130/88 107/73 (!) 141/95  Pulse:  96 91 85  Resp:  '16 19 17  '$ Temp:  99.4 F (37.4 C) 99.2 F (37.3 C) 98.4 F (36.9 C)  TempSrc:  Oral Oral Oral  SpO2: 100% 97% 97% 100%  Weight:  130.6 kg  130.9 kg  Height:        Intake/Output Summary (Last 24 hours) at 09/25/2019 0742 Last data filed at 09/24/2019 2208 Gross per 24 hour  Intake 603 ml  Output 450 ml  Net 153 ml   Filed Weights   09/23/19 0504 09/24/19 0559 09/25/19 0659  Weight: 131 kg 130.6 kg 130.9 kg     Data Reviewed:   CBC: Recent Labs  Lab 09/21/19 0308 09/22/19 0541 09/23/19 0458 09/24/19 0427 09/25/19 0533  WBC 7.5 6.9 8.1 6.4 5.4  NEUTROABS  --   --   --   --  3.3  HGB 8.6* 8.8* 9.3* 8.8* 8.5*  HCT 24.9* 25.7* 26.7* 25.5* 24.7*  MCV 82.2 84.0 83.7 84.7 85.5  PLT 171 184 199 175 578   Basic Metabolic Panel: Recent Labs  Lab 09/21/19 0308 09/22/19 0541  09/23/19 0458 09/24/19 0427 09/25/19 0533  NA 139 139 139 139 139  K 3.8 3.7 3.6 3.6 3.6  CL 110 109 108 110 110  CO2 '22 23 24 22 22  '$ GLUCOSE 147* 136* 142* 155* 141*  BUN '11 12 12 14 12  '$ CREATININE 1.34* 1.38* 1.30* 1.30* 1.27*  CALCIUM 8.4* 8.7* 9.0 9.1 8.7*   GFR: Estimated Creatinine Clearance: 95.6 mL/min (A) (by C-G formula based on SCr of 1.27 mg/dL (H)). Liver Function Tests: No results for input(s): AST, ALT, ALKPHOS, BILITOT, PROT, ALBUMIN in the last 168 hours. No results for input(s): LIPASE, AMYLASE in the last 168 hours. No results for input(s): AMMONIA in the last 168 hours. Coagulation Profile: Recent Labs  Lab 09/21/19 0308 09/22/19 0541 09/23/19 0458 09/24/19 0427 09/25/19 0533  INR 1.6* 1.4* 1.3* 1.2 1.2   Cardiac Enzymes: No results for input(s): CKTOTAL, CKMB, CKMBINDEX, TROPONINI in the last 168 hours. BNP (last 3 results) No results for input(s): PROBNP in the last 8760 hours. HbA1C: No results for input(s): HGBA1C in the last 72 hours. CBG: Recent Labs  Lab 09/23/19 2056 09/24/19 0745 09/24/19 1139 09/24/19 1654 09/24/19 2128  GLUCAP 177* 129* 161* 118* 97   Lipid Profile: No results for input(s): CHOL, HDL, LDLCALC, TRIG, CHOLHDL, LDLDIRECT in the last  72 hours. Thyroid Function Tests: No results for input(s): TSH, T4TOTAL, FREET4, T3FREE, THYROIDAB in the last 72 hours. Anemia Panel: No results for input(s): VITAMINB12, FOLATE, FERRITIN, TIBC, IRON, RETICCTPCT in the last 72 hours. Sepsis Labs: No results for input(s): PROCALCITON, LATICACIDVEN in the last 168 hours.  Recent Results (from the past 240 hour(s))  Surgical PCR screen     Status: None   Collection Time: 09/16/19  3:34 PM   Specimen: Nasal Mucosa; Nasal Swab  Result Value Ref Range Status   MRSA, PCR NEGATIVE NEGATIVE Final   Staphylococcus aureus NEGATIVE NEGATIVE Final    Comment: (NOTE) The Xpert SA Assay (FDA approved for NASAL specimens in patients 29 years of  age and older), is one component of a comprehensive surveillance program. It is not intended to diagnose infection nor to guide or monitor treatment. Performed at [facility], [place] [address], [place], [place] [address]   Surgical pcr screen     Status: None   Collection Time: 09/23/19  4:39 AM   Specimen: Nasal Mucosa; Nasal Swab  Result Value Ref Range Status   MRSA, PCR NEGATIVE NEGATIVE Final   Staphylococcus aureus NEGATIVE NEGATIVE Final    Comment: (NOTE) The Xpert SA Assay (FDA approved for NASAL specimens in patients 87 years of age and older), is one component of a comprehensive surveillance program. It is not intended to diagnose infection nor to guide or monitor treatment. Performed at [facility], [place] [address], [place], [place] [address]      Radiology Studies: No results found.  Scheduled Meds: . aspirin EC  81 mg Oral Daily  . atorvastatin  40 mg Oral Q breakfast  . colchicine  0.6 mg Oral BID  . feeding supplement  1 Container Oral BID BM  . feeding supplement (ENSURE ENLIVE)  237 mL Oral BID BM  . feeding supplement (PRO-STAT SUGAR FREE 64)  30 mL Oral BID  . gabapentin  300 mg Oral Q breakfast  . insulin aspart  0-15 Units Subcutaneous TID WC  . insulin aspart  0-5 Units Subcutaneous QHS  . insulin aspart  6 Units Subcutaneous TID WC  . insulin detemir  6 Units Subcutaneous BID  . levothyroxine  50 mcg Oral Q0600  . multivitamin with minerals  1 tablet Oral Daily  . polyethylene glycol  17 g Oral BID  . sodium chloride flush  10-40 mL Intracatheter Q12H  . sodium chloride flush  3 mL Intravenous Once  . sodium chloride flush  3 mL Intravenous Q12H   Continuous Infusions: . sodium chloride    . cefTRIAXone (ROCEPHIN)  IV 2 g (09/24/19 0955)  . gentamicin 220 mg (09/24/19 1324)  . heparin 1,900 Units/hr (09/24/19 2353)  . lactated ringers       LOS: 17 days   Time spent= 35 mins  Little Ishikawa, DO [facility]  If  7PM-7AM, please contact night-coverage  09/25/2019, 7:42 AM

## 2019-09-25 NOTE — Progress Notes (Addendum)
ANTICOAGULATION CONSULT NOTE - Follow Up Consult  Pharmacy Consult for warfarin (held) > heparin Indication: atrial fibrillation and + mechanical AVR  Allergies  Allergen Reactions  . Diltiazem Hives  . Insulin Glargine Swelling    edema    Patient Measurements: Height: '6\' 2"'$  (188 cm) Weight: 130.9 kg (288 lb 8 oz) IBW/kg (Calculated) : 82.2 Heparin Dosing Weight: 131.5 kg   Vital Signs: Temp: 98.4 F (36.9 C) (05/05 0659) Temp Source: Oral (05/05 0659) BP: 141/95 (05/05 0659) Pulse Rate: 85 (05/05 0659)  Labs: Recent Labs    09/23/19 0458 09/23/19 0458 09/24/19 0427 09/24/19 1044 09/25/19 0533  HGB 9.3*   < > 8.8*  --  8.5*  HCT 26.7*  --  25.5*  --  24.7*  PLT 199  --  175  --  151  LABPROT 15.7*  --  15.0  --  14.8  INR 1.3*  --  1.2  --  1.2  HEPARINUNFRC 0.67   < > 0.32 0.38 0.41  CREATININE 1.30*  --  1.30*  --  1.27*   < > = values in this interval not displayed.    Estimated Creatinine Clearance: 95.6 mL/min (A) (by C-G formula based on SCr of 1.27 mg/dL (H)).   Medications:  Infusions:  . sodium chloride    . cefTRIAXone (ROCEPHIN)  IV 2 g (09/24/19 0955)  . gentamicin 220 mg (09/24/19 1324)  . heparin 1,900 Units/hr (09/24/19 2353)  . lactated ringers      Assessment: Assessment: 53 YOM on warfarin PTA for Afib and mechanical AVR (INR goal 2.5- 3.5). Vegetation seen on TTE this admission. Warfarin is held and bridged with IV heparin with impending need for procedures. He is s/p cath 4/26,  teeth extraction 4/27 and T10 lesion biopsy 4/28. Heparin restarted 4/29. Noted s/p biopsy on 4/29 and plan CABG and redo AVR on 5/3.  Heparin level is therapeutic today at 0.41, on 1900 units/hr. Hgb 8.5, plt 151. No infusion issues. RN continues to report gum bleeding has subsided.   Underwent CT bone marrow biopsy and aspiration - okay per IR procedure to restart 4 hours later.   Warfarin PTA dosing: '5mg'$  MWF, 7.'5mg'$  all other days   Goal of Therapy:   Heparin level 0.3-0.4 units/mL Monitor platelets by anticoagulation protocol: Yes   Plan:  CABG postponed given continued bleeding at gums- planning Thursday 5/6 Will restart heparin infusion at 1900 units/hr on 5/5 at 1400. Monitor daily heparin level, CBC, and for s/sx of bleeding  Antonietta Jewel, PharmD, [place] Pharmacist  Phone: [phone] 09/25/2019 7:18 AM  Please check AMION for all [place] phone numbers After 10:00 PM, call [place] [phone]

## 2019-09-25 NOTE — Progress Notes (Addendum)
   09/25/19 1150  Assess: MEWS Score  Temp 97.8 F (36.6 C)  BP 131/83  Pulse Rate 86  ECG Heart Rate 83  Resp 15  Level of Consciousness Alert  SpO2 98 %  O2 Device Room Air  Pt returned from IR. Vital signs taken.

## 2019-09-25 NOTE — Procedures (Signed)
  Procedure: CT guided Left iliac bone marrow biopsy   EBL:   minimal Complications:  none immediate  See full dictation in [facility].  Dillard Cannon MD Main # [phone] Pager  [phone]

## 2019-09-26 ENCOUNTER — Inpatient Hospital Stay (HOSPITAL_COMMUNITY)
Admission: EM | Disposition: A | Discharge status: Home Health | Payer: Self-pay | Source: Home / Self Care | Attending: Surgery

## 2019-09-26 ENCOUNTER — Inpatient Hospital Stay (HOSPITAL_COMMUNITY): Payer: Medicare PPO | Admitting: Anesthesiology

## 2019-09-26 ENCOUNTER — Inpatient Hospital Stay (HOSPITAL_COMMUNITY): Payer: Medicare PPO

## 2019-09-26 DIAGNOSIS — I350 Nonrheumatic aortic (valve) stenosis: Secondary | ICD-10-CM

## 2019-09-26 DIAGNOSIS — I339 Acute and subacute endocarditis, unspecified: Secondary | ICD-10-CM

## 2019-09-26 DIAGNOSIS — I253 Aneurysm of heart: Secondary | ICD-10-CM

## 2019-09-26 DIAGNOSIS — I38 Endocarditis, valve unspecified: Secondary | ICD-10-CM | POA: Diagnosis present

## 2019-09-26 HISTORY — PX: TEE WITHOUT CARDIOVERSION: SHX5443

## 2019-09-26 HISTORY — PX: CORONARY ARTERY BYPASS GRAFT: SHX141

## 2019-09-26 HISTORY — PX: ASCENDING AORTIC ROOT REPLACEMENT: SHX5729

## 2019-09-26 LAB — TYPE AND SCREEN
ABO/RH(D): A POS
Antibody Screen: NEGATIVE
Unit division: 0
Unit division: 0
Unit division: 0
Unit division: 0
Unit division: 0
Unit division: 0

## 2019-09-26 LAB — BPAM RBC
Blood Product Expiration Date: 202105272359
Blood Product Expiration Date: 202105272359
Blood Product Expiration Date: 202105272359
Blood Product Expiration Date: 202105272359
Blood Product Expiration Date: 202105272359
Blood Product Expiration Date: 202105272359
ISSUE DATE / TIME: 202105030700
ISSUE DATE / TIME: 202105031733
ISSUE DATE / TIME: 202105032020
ISSUE DATE / TIME: 202105050938
Unit Type and Rh: 6200
Unit Type and Rh: 6200
Unit Type and Rh: 6200
Unit Type and Rh: 6200
Unit Type and Rh: 6200
Unit Type and Rh: 6200

## 2019-09-26 LAB — COMPREHENSIVE METABOLIC PANEL
ALT: 25 U/L (ref 0–44)
AST: 37 U/L (ref 15–41)
Albumin: 2.7 g/dL — ABNORMAL LOW (ref 3.5–5.0)
Alkaline Phosphatase: 73 U/L (ref 38–126)
Anion gap: 10 (ref 5–15)
BUN: 14 mg/dL (ref 6–20)
CO2: 23 mmol/L (ref 22–32)
Calcium: 8.7 mg/dL — ABNORMAL LOW (ref 8.9–10.3)
Chloride: 107 mmol/L (ref 98–111)
Creatinine, Ser: 1.23 mg/dL (ref 0.61–1.24)
GFR calc Af Amer: >60 mL/min (ref >60)
GFR calc non Af Amer: >60 mL/min (ref >60)
Glucose, Bld: 146 mg/dL — ABNORMAL HIGH (ref 70–99)
Potassium: 3.9 mmol/L (ref 3.5–5.1)
Sodium: 140 mmol/L (ref 135–145)
Total Bilirubin: 0.8 mg/dL (ref 0.3–1.2)
Total Protein: 7.1 g/dL (ref 6.5–8.1)

## 2019-09-26 LAB — CBC
HCT: 24.4 % — ABNORMAL LOW (ref 39.0–52.0)
Hemoglobin: 8.4 g/dL — ABNORMAL LOW (ref 13.0–17.0)
MCH: 29.6 pg (ref 26.0–34.0)
MCHC: 34.4 g/dL (ref 30.0–36.0)
MCV: 85.9 fL (ref 80.0–100.0)
Platelets: 141 10*3/uL — ABNORMAL LOW (ref 150–400)
RBC: 2.84 MIL/uL — ABNORMAL LOW (ref 4.22–5.81)
RDW: 16.9 % — ABNORMAL HIGH (ref 11.5–15.5)
WBC: 5.3 10*3/uL (ref 4.0–10.5)
nRBC: 0 % (ref 0.0–0.2)

## 2019-09-26 LAB — URINALYSIS, ROUTINE W REFLEX MICROSCOPIC
Bilirubin Urine: NEGATIVE
Glucose, UA: NEGATIVE mg/dL
Hgb urine dipstick: NEGATIVE
Ketones, ur: NEGATIVE mg/dL
Leukocytes,Ua: NEGATIVE
Nitrite: NEGATIVE
Protein, ur: NEGATIVE mg/dL
Specific Gravity, Urine: 1.014 (ref 1.005–1.030)
pH: 5 (ref 5.0–8.0)

## 2019-09-26 LAB — FIBRINOGEN: Fibrinogen: 296 mg/dL (ref 210–475)

## 2019-09-26 LAB — HEMOGLOBIN A1C
Hgb A1c MFr Bld: 7.3 % — ABNORMAL HIGH (ref 4.8–5.6)
Mean Plasma Glucose: 162.81 mg/dL

## 2019-09-26 LAB — PLATELET COUNT: Platelets: 74 10*3/uL — ABNORMAL LOW (ref 150–400)

## 2019-09-26 LAB — APTT: aPTT: 101 seconds — ABNORMAL HIGH (ref 24–36)

## 2019-09-26 LAB — PROTIME-INR
INR: 1.2 (ref 0.8–1.2)
Prothrombin Time: 14.4 seconds (ref 11.4–15.2)

## 2019-09-26 LAB — KAPPA/LAMBDA LIGHT CHAINS
Kappa free light chain: 58.6 mg/L — ABNORMAL HIGH (ref 3.3–19.4)
Kappa, lambda light chain ratio: 2.33 — ABNORMAL HIGH (ref 0.26–1.65)
Lambda free light chains: 25.2 mg/L (ref 5.7–26.3)

## 2019-09-26 LAB — HEPARIN LEVEL (UNFRACTIONATED): Heparin Unfractionated: 0.3 IU/mL (ref 0.30–0.70)

## 2019-09-26 LAB — HEMOGLOBIN AND HEMATOCRIT, BLOOD
HCT: 24.9 % — ABNORMAL LOW (ref 39.0–52.0)
Hemoglobin: 8.6 g/dL — ABNORMAL LOW (ref 13.0–17.0)

## 2019-09-26 LAB — GLUCOSE, CAPILLARY: Glucose-Capillary: 132 mg/dL — ABNORMAL HIGH (ref 70–99)

## 2019-09-26 SURGERY — REDO STERNOTOMY
Anesthesia: General | Site: Chest

## 2019-09-26 MED ORDER — THROMBIN 20000 UNITS EX SOLR
CUTANEOUS | Status: DC | PRN
  Administered 2019-09-26: 20000 [IU] via TOPICAL

## 2019-09-26 MED ORDER — PROPOFOL 10 MG/ML IV BOLUS
INTRAVENOUS | Status: DC | PRN
  Administered 2019-09-26: 200 mg via INTRAVENOUS
  Administered 2019-09-26: 20 mg via INTRAVENOUS
  Administered 2019-09-26: 120 mg via INTRAVENOUS
  Administered 2019-09-26: 20 mg via INTRAVENOUS

## 2019-09-26 MED ORDER — LACTATED RINGERS IV SOLN: INTRAVENOUS | Status: DC | PRN

## 2019-09-26 MED ORDER — HEMOSTATIC AGENTS (NO CHARGE) OPTIME
TOPICAL | Status: DC | PRN
  Administered 2019-09-26 (×7): 1 via TOPICAL

## 2019-09-26 MED ORDER — ALBUMIN HUMAN 5 % IV SOLN: INTRAVENOUS | Status: DC | PRN

## 2019-09-26 MED ORDER — TRANEXAMIC ACID (OHS) PUMP PRIME SOLUTION
2.0000 mg/kg | INTRAVENOUS | Status: DC
  Filled 2019-09-26: qty 2.64

## 2019-09-26 MED ORDER — MAGNESIUM SULFATE 50 % IJ SOLN
40.0000 meq | INTRAMUSCULAR | Status: DC
  Filled 2019-09-26: qty 9.85

## 2019-09-26 MED ORDER — SODIUM CHLORIDE 0.9 % IV SOLN
INTRAVENOUS | Status: DC | PRN
  Administered 2019-09-26: 1.5 g via INTRAVENOUS

## 2019-09-26 MED ORDER — ROCURONIUM BROMIDE 10 MG/ML (PF) SYRINGE
PREFILLED_SYRINGE | INTRAVENOUS | Status: AC
  Filled 2019-09-26: qty 40

## 2019-09-26 MED ORDER — EPINEPHRINE HCL 5 MG/250ML IV SOLN IN NS
0.0000 ug/min | INTRAVENOUS | Status: DC
  Filled 2019-09-26: qty 250

## 2019-09-26 MED ORDER — SODIUM CHLORIDE 0.9 % IV SOLN
INTRAVENOUS | Status: DC
  Filled 2019-09-26: qty 30

## 2019-09-26 MED ORDER — HYDROCORTISONE NA SUCCINATE PF 100 MG IJ SOLR
INTRAMUSCULAR | Status: DC | PRN
  Administered 2019-09-26: 125 mg via INTRAVENOUS

## 2019-09-26 MED ORDER — 0.9 % SODIUM CHLORIDE (POUR BTL) OPTIME
TOPICAL | Status: DC | PRN
  Administered 2019-09-26: 2000 mL
  Administered 2019-09-26 (×2): 1000 mL
  Administered 2019-09-26: 5000 mL

## 2019-09-26 MED ORDER — MIDAZOLAM HCL 5 MG/5ML IJ SOLN
INTRAMUSCULAR | Status: DC | PRN
  Administered 2019-09-26 (×2): 1 mg via INTRAVENOUS
  Administered 2019-09-26 (×3): 2 mg via INTRAVENOUS
  Administered 2019-09-26 – 2019-09-27 (×2): 1 mg via INTRAVENOUS

## 2019-09-26 MED ORDER — SODIUM CHLORIDE 0.9 % IV SOLN: INTRAVENOUS | Status: DC | PRN

## 2019-09-26 MED ORDER — DOPAMINE-DEXTROSE 3.2-5 MG/ML-% IV SOLN
INTRAVENOUS | Status: DC | PRN
  Administered 2019-09-26: 2 ug/kg/min via INTRAVENOUS

## 2019-09-26 MED ORDER — TRANEXAMIC ACID 1000 MG/10ML IV SOLN
1.5000 mg/kg/h | INTRAVENOUS | Status: DC
  Administered 2019-09-27: 1.5 mg/kg/h via INTRAVENOUS
  Filled 2019-09-26 (×2): qty 25

## 2019-09-26 MED ORDER — NOREPINEPHRINE 4 MG/250ML-% IV SOLN
0.0000 ug/min | INTRAVENOUS | Status: DC
  Filled 2019-09-26: qty 250

## 2019-09-26 MED ORDER — VANCOMYCIN HCL 1500 MG/300ML IV SOLN
1500.0000 mg | INTRAVENOUS | Status: DC
  Filled 2019-09-26: qty 300

## 2019-09-26 MED ORDER — SODIUM CHLORIDE 0.9 % IV SOLN
INTRAVENOUS | Status: AC | PRN
  Administered 2019-09-26: 1000 mL

## 2019-09-26 MED ORDER — LACTATED RINGERS IR SOLN
Status: DC | PRN
  Administered 2019-09-26: 1000 mL

## 2019-09-26 MED ORDER — PHENYLEPHRINE HCL-NACL 20-0.9 MG/250ML-% IV SOLN
30.0000 ug/min | INTRAVENOUS | Status: DC
  Filled 2019-09-26: qty 250

## 2019-09-26 MED ORDER — PLASMA-LYTE 148 IV SOLN
INTRAVENOUS | Status: DC
  Filled 2019-09-26: qty 2.5

## 2019-09-26 MED ORDER — INSULIN REGULAR(HUMAN) IN NACL 100-0.9 UT/100ML-% IV SOLN
INTRAVENOUS | Status: AC
  Administered 2019-09-26: 3.2 [IU]/h via INTRAVENOUS
  Filled 2019-09-26: qty 100

## 2019-09-26 MED ORDER — SODIUM CHLORIDE 0.9% IV SOLUTION: Freq: Once | INTRAVENOUS | Status: AC

## 2019-09-26 MED ORDER — PROPOFOL 10 MG/ML IV BOLUS
INTRAVENOUS | Status: AC
  Filled 2019-09-26: qty 20

## 2019-09-26 MED ORDER — PROTAMINE SULFATE 10 MG/ML IV SOLN
INTRAVENOUS | Status: DC | PRN
  Administered 2019-09-26: 10 mg via INTRAVENOUS
  Administered 2019-09-26: 290 mg via INTRAVENOUS

## 2019-09-26 MED ORDER — PHENYLEPHRINE HCL-NACL 20-0.9 MG/250ML-% IV SOLN
INTRAVENOUS | Status: DC | PRN
  Administered 2019-09-26: 25 ug/min via INTRAVENOUS

## 2019-09-26 MED ORDER — LIDOCAINE 2% (20 MG/ML) 5 ML SYRINGE
INTRAMUSCULAR | Status: DC | PRN
  Administered 2019-09-26: 100 mg via INTRAVENOUS

## 2019-09-26 MED ORDER — SODIUM CHLORIDE 0.9 % IV SOLN
750.0000 mg | INTRAVENOUS | Status: DC
  Filled 2019-09-26: qty 750

## 2019-09-26 MED ORDER — MIDAZOLAM HCL (PF) 10 MG/2ML IJ SOLN
INTRAMUSCULAR | Status: AC
  Filled 2019-09-26: qty 2

## 2019-09-26 MED ORDER — FENTANYL CITRATE (PF) 250 MCG/5ML IJ SOLN
INTRAMUSCULAR | Status: AC
  Filled 2019-09-26: qty 25

## 2019-09-26 MED ORDER — SODIUM CHLORIDE 0.9 % IV SOLN
1.5000 g | INTRAVENOUS | Status: DC
  Filled 2019-09-26: qty 1.5

## 2019-09-26 MED ORDER — THROMBIN (RECOMBINANT) 20000 UNITS EX SOLR
CUTANEOUS | Status: AC
  Filled 2019-09-26: qty 20000

## 2019-09-26 MED ORDER — MILRINONE LACTATE IN DEXTROSE 20-5 MG/100ML-% IV SOLN
0.3000 ug/kg/min | INTRAVENOUS | Status: DC
  Filled 2019-09-26: qty 100

## 2019-09-26 MED ORDER — TRANEXAMIC ACID 1000 MG/10ML IV SOLN
1.5000 mg/kg/h | INTRAVENOUS | Status: AC
  Administered 2019-09-26 (×2): 1.5 mg/kg/h via INTRAVENOUS
  Filled 2019-09-26: qty 25

## 2019-09-26 MED ORDER — SODIUM CHLORIDE 0.9% IV SOLUTION: Freq: Once | INTRAVENOUS | Status: DC

## 2019-09-26 MED ORDER — HEPARIN SODIUM (PORCINE) 1000 UNIT/ML IJ SOLN
INTRAMUSCULAR | Status: DC | PRN
  Administered 2019-09-26: 40000 [IU] via INTRAVENOUS

## 2019-09-26 MED ORDER — NOREPINEPHRINE 4 MG/250ML-% IV SOLN
INTRAVENOUS | Status: DC | PRN
  Administered 2019-09-26: 2 ug/min via INTRAVENOUS

## 2019-09-26 MED ORDER — TRANEXAMIC ACID (OHS) BOLUS VIA INFUSION
15.0000 mg/kg | INTRAVENOUS | Status: AC
  Administered 2019-09-26: 1980 mg via INTRAVENOUS
  Filled 2019-09-26: qty 1980

## 2019-09-26 MED ORDER — SUCCINYLCHOLINE CHLORIDE 200 MG/10ML IV SOSY
PREFILLED_SYRINGE | INTRAVENOUS | Status: DC | PRN
  Administered 2019-09-26: 160 mg via INTRAVENOUS

## 2019-09-26 MED ORDER — VANCOMYCIN HCL 1000 MG IV SOLR
INTRAVENOUS | Status: DC | PRN
  Administered 2019-09-26: 1500 mg via INTRAVENOUS

## 2019-09-26 MED ORDER — POTASSIUM CHLORIDE 2 MEQ/ML IV SOLN
80.0000 meq | INTRAVENOUS | Status: DC
  Filled 2019-09-26: qty 40

## 2019-09-26 MED ORDER — PHENYLEPHRINE 40 MCG/ML (10ML) SYRINGE FOR IV PUSH (FOR BLOOD PRESSURE SUPPORT)
PREFILLED_SYRINGE | INTRAVENOUS | Status: DC | PRN
  Administered 2019-09-26: 20 ug via INTRAVENOUS
  Administered 2019-09-26 (×2): 40 ug via INTRAVENOUS
  Administered 2019-09-26: 20 ug via INTRAVENOUS
  Administered 2019-09-26: 80 ug via INTRAVENOUS
  Administered 2019-09-26: 40 ug via INTRAVENOUS

## 2019-09-26 MED ORDER — ARTIFICIAL TEARS OPHTHALMIC OINT
TOPICAL_OINTMENT | OPHTHALMIC | Status: DC | PRN
  Administered 2019-09-26: 1 via OPHTHALMIC

## 2019-09-26 MED ORDER — PLASMA-LYTE 148 IV SOLN
INTRAVENOUS | Status: DC | PRN
  Administered 2019-09-26: 500 mL

## 2019-09-26 MED ORDER — DEXMEDETOMIDINE HCL IN NACL 400 MCG/100ML IV SOLN
0.1000 ug/kg/h | INTRAVENOUS | Status: AC
  Administered 2019-09-26: .7 ug/kg/h via INTRAVENOUS
  Filled 2019-09-26: qty 100

## 2019-09-26 MED ORDER — ROCURONIUM BROMIDE 10 MG/ML (PF) SYRINGE
PREFILLED_SYRINGE | INTRAVENOUS | Status: DC | PRN
  Administered 2019-09-26 (×4): 50 mg via INTRAVENOUS
  Administered 2019-09-26: 100 mg via INTRAVENOUS

## 2019-09-26 MED ORDER — FENTANYL CITRATE (PF) 250 MCG/5ML IJ SOLN
INTRAMUSCULAR | Status: DC | PRN
  Administered 2019-09-26: 50 ug via INTRAVENOUS
  Administered 2019-09-26: 100 ug via INTRAVENOUS
  Administered 2019-09-26: 150 ug via INTRAVENOUS
  Administered 2019-09-26: 250 ug via INTRAVENOUS
  Administered 2019-09-26: 50 ug via INTRAVENOUS
  Administered 2019-09-26 (×2): 100 ug via INTRAVENOUS
  Administered 2019-09-26 (×3): 150 ug via INTRAVENOUS

## 2019-09-26 MED ORDER — NITROGLYCERIN IN D5W 200-5 MCG/ML-% IV SOLN
2.0000 ug/min | INTRAVENOUS | Status: DC
  Filled 2019-09-26: qty 250

## 2019-09-26 SURGICAL SUPPLY — 173 items
ADAPTER CARDIO PERF ANTE/RETRO (ADAPTER) ×3 IMPLANT
ADH SKN CLS APL DERMABOND .7 (GAUZE/BANDAGES/DRESSINGS) ×2
ADPR PRFSN 84XANTGRD RTRGD (ADAPTER) ×2
APL SRG 7X2 LUM MLBL SLNT (VASCULAR PRODUCTS)
APL SWBSTK 6 STRL LF DISP (MISCELLANEOUS) ×2
APPLICATOR COTTON TIP 6 STRL (MISCELLANEOUS) IMPLANT
APPLICATOR COTTON TIP 6IN STRL (MISCELLANEOUS) ×3
APPLICATOR TIP COSEAL (VASCULAR PRODUCTS) IMPLANT
ATTRACTOMAT 16X20 MAGNETIC DRP (DRAPES) ×2 IMPLANT
BAG DECANTER FOR FLEXI CONT (MISCELLANEOUS) ×3 IMPLANT
BLADE CLIPPER SURG (BLADE) ×3 IMPLANT
BLADE CORE FAN STRYKER (BLADE) ×3 IMPLANT
BLADE STERNUM SYSTEM 6 (BLADE) ×2 IMPLANT
BLADE SURG 11 STRL SS (BLADE) ×2 IMPLANT
BLADE SURG 15 STRL LF DISP TIS (BLADE) ×2 IMPLANT
BLADE SURG 15 STRL SS (BLADE) ×9
BLOOD HAEMOCONCENTR 700 MIDI (MISCELLANEOUS) ×1 IMPLANT
BNDG ELASTIC 4X5.8 VLCR STR LF (GAUZE/BANDAGES/DRESSINGS) ×3 IMPLANT
BNDG ELASTIC 6X5.8 VLCR STR LF (GAUZE/BANDAGES/DRESSINGS) ×3 IMPLANT
BNDG GAUZE ELAST 4 BULKY (GAUZE/BANDAGES/DRESSINGS) ×3 IMPLANT
CANISTER SUCT 3000ML PPV (MISCELLANEOUS) ×3 IMPLANT
CANN PRFSN 3/8X14X24FR PCFC (MISCELLANEOUS) ×2
CANNULA GUNDRY RCSP 15FR (MISCELLANEOUS) ×3 IMPLANT
CANNULA OPTISITE PERFUSION 20F (CANNULA) ×1 IMPLANT
CANNULA PRFSN 3/8X14X24FR PCFC (MISCELLANEOUS) IMPLANT
CANNULA SUMP PERICARDIAL (CANNULA) ×1 IMPLANT
CANNULA VEN MTL TIP RT (MISCELLANEOUS) ×3
CANNULA VRC MALB SNGL STG 28FR (MISCELLANEOUS) IMPLANT
CANNULA VRC MALB SNGL STG 36FR (MISCELLANEOUS) IMPLANT
CATH HEART VENT LEFT (CATHETERS) ×2 IMPLANT
CATH ROBINSON RED A/P 18FR (CATHETERS) ×10 IMPLANT
CATH THORACIC 28FR (CATHETERS) ×3 IMPLANT
CATH THORACIC 36FR (CATHETERS) ×3 IMPLANT
CATH THORACIC 36FR RT ANG (CATHETERS) ×3 IMPLANT
CAUTERY SURG HI TEMP FINE TIP (MISCELLANEOUS) ×1 IMPLANT
CLIP VESOCCLUDE MED 24/CT (CLIP) IMPLANT
CLIP VESOCCLUDE SM WIDE 24/CT (CLIP) IMPLANT
CNTNR URN SCR LID CUP LEK RST (MISCELLANEOUS) ×2 IMPLANT
CONN 1/2X1/2X1/2  BEN (MISCELLANEOUS) ×3
CONN 1/2X1/2X1/2 BEN (MISCELLANEOUS) IMPLANT
CONN 3/8X1/2 ST GISH (MISCELLANEOUS) ×3 IMPLANT
CONT SPEC 4OZ STRL OR WHT (MISCELLANEOUS) ×9
COUNTER NEEDLE 20 DBL MAG RED (NEEDLE) ×2 IMPLANT
COVER SURGICAL LIGHT HANDLE (MISCELLANEOUS) ×5 IMPLANT
DEFOGGER ANTIFOG KIT (MISCELLANEOUS) ×1 IMPLANT
DERMABOND ADVANCED (GAUZE/BANDAGES/DRESSINGS) ×1
DERMABOND ADVANCED .7 DNX12 (GAUZE/BANDAGES/DRESSINGS) IMPLANT
DRAPE CARDIOVASCULAR INCISE (DRAPES) ×3
DRAPE SLUSH/WARMER DISC (DRAPES) ×3 IMPLANT
DRAPE SRG 135X102X78XABS (DRAPES) ×2 IMPLANT
DRSG AQUACEL AG ADV 3.5X14 (GAUZE/BANDAGES/DRESSINGS) ×2 IMPLANT
DRSG COVADERM 4X14 (GAUZE/BANDAGES/DRESSINGS) ×2 IMPLANT
ELECT CAUTERY BLADE 6.4 (BLADE) ×3 IMPLANT
ELECT REM PT RETURN 9FT ADLT (ELECTROSURGICAL) ×6
ELECTRODE REM PT RTRN 9FT ADLT (ELECTROSURGICAL) ×4 IMPLANT
FELT TEFLON 1X6 (MISCELLANEOUS) ×8 IMPLANT
GAUZE SPONGE 4X4 12PLY STRL (GAUZE/BANDAGES/DRESSINGS) ×5 IMPLANT
GAUZE SPONGE 4X4 16PLY XRAY LF (GAUZE/BANDAGES/DRESSINGS) ×1 IMPLANT
GLOVE BIO SURGEON STRL SZ 6 (GLOVE) ×3 IMPLANT
GLOVE BIO SURGEON STRL SZ 6.5 (GLOVE) ×7 IMPLANT
GLOVE BIO SURGEON STRL SZ7 (GLOVE) IMPLANT
GLOVE BIO SURGEON STRL SZ7.5 (GLOVE) IMPLANT
GLOVE BIO SURGEON STRL SZ8.5 (GLOVE) ×1 IMPLANT
GLOVE BIOGEL PI IND STRL 6 (GLOVE) IMPLANT
GLOVE BIOGEL PI IND STRL 6.5 (GLOVE) IMPLANT
GLOVE BIOGEL PI IND STRL 7.0 (GLOVE) IMPLANT
GLOVE BIOGEL PI IND STRL 7.5 (GLOVE) IMPLANT
GLOVE BIOGEL PI INDICATOR 6 (GLOVE) ×2
GLOVE BIOGEL PI INDICATOR 6.5 (GLOVE) ×4
GLOVE BIOGEL PI INDICATOR 7.0 (GLOVE)
GLOVE BIOGEL PI INDICATOR 7.5 (GLOVE) ×2
GLOVE ORTHO TXT STRL SZ7.5 (GLOVE) IMPLANT
GLOVE SS BIOGEL STRL SZ 7 (GLOVE) ×4 IMPLANT
GLOVE SUPERSENSE BIOGEL SZ 7 (GLOVE) ×2
GLOVE SURG SS PI 7.5 STRL IVOR (GLOVE) ×2 IMPLANT
GOWN STRL REUS W/ TWL LRG LVL3 (GOWN DISPOSABLE) ×8 IMPLANT
GOWN STRL REUS W/ TWL XL LVL3 (GOWN DISPOSABLE) ×2 IMPLANT
GOWN STRL REUS W/TWL LRG LVL3 (GOWN DISPOSABLE) ×57
GOWN STRL REUS W/TWL XL LVL3 (GOWN DISPOSABLE) ×6
GRAFT HEMASHIELD 30X10 (Vascular Products) ×1 IMPLANT
GRAFT VALVE AORTIC CRYO 25 (Prosthesis & Implant Heart) IMPLANT
HEMOSTAT POWDER SURGIFOAM 1G (HEMOSTASIS) ×9 IMPLANT
HEMOSTAT SURGICEL 2X14 (HEMOSTASIS) ×5 IMPLANT
INSERT FOGARTY 61MM (MISCELLANEOUS) IMPLANT
INSERT FOGARTY XLG (MISCELLANEOUS) ×1 IMPLANT
IV NS 1000ML (IV SOLUTION) ×3
IV NS 1000ML BAXH (IV SOLUTION) IMPLANT
KIT BASIN OR (CUSTOM PROCEDURE TRAY) ×3 IMPLANT
KIT CATH CPB BARTLE (MISCELLANEOUS) ×3 IMPLANT
KIT DILATOR VASC 18G NDL (KITS) ×1 IMPLANT
KIT SUCTION CATH 14FR (SUCTIONS) ×3 IMPLANT
KIT TURNOVER KIT B (KITS) ×3 IMPLANT
KIT VASOVIEW ACCESSORY VH 2004 (KITS) ×1 IMPLANT
KIT VASOVIEW HEMOPRO 2 VH 4000 (KITS) ×3 IMPLANT
LINE VENT (MISCELLANEOUS) ×1 IMPLANT
LOOP VESSEL SUPERMAXI WHITE (MISCELLANEOUS) ×1 IMPLANT
NDL AORTIC AIR ASPIRATING (NEEDLE) IMPLANT
NEEDLE AORTIC AIR ASPIRATING (NEEDLE) ×3 IMPLANT
NS IRRIG 1000ML POUR BTL (IV SOLUTION) ×19 IMPLANT
PACK E OPEN HEART (SUTURE) ×3 IMPLANT
PACK OPEN HEART (CUSTOM PROCEDURE TRAY) ×3 IMPLANT
PAD ARMBOARD 7.5X6 YLW CONV (MISCELLANEOUS) ×6 IMPLANT
PAD ELECT DEFIB RADIOL ZOLL (MISCELLANEOUS) ×3 IMPLANT
PENCIL BUTTON HOLSTER BLD 10FT (ELECTRODE) ×3 IMPLANT
POSITIONER HEAD DONUT 9IN (MISCELLANEOUS) ×3 IMPLANT
PUNCH AORTIC ROTATE 4.0MM (MISCELLANEOUS) IMPLANT
PUNCH AORTIC ROTATE 4.5MM 8IN (MISCELLANEOUS) ×3 IMPLANT
PUNCH AORTIC ROTATE 5MM 8IN (MISCELLANEOUS) IMPLANT
SEALANT SURG COSEAL 8ML (VASCULAR PRODUCTS) IMPLANT
SET CARDIOPLEGIA MPS 5001102 (MISCELLANEOUS) ×1 IMPLANT
SET VEIN GRAFT PERF (SET/KITS/TRAYS/PACK) ×1 IMPLANT
SOL ANTI FOG 6CC (MISCELLANEOUS) IMPLANT
SOLUTION ANTI FOG 6CC (MISCELLANEOUS) ×1
SPONGE INTESTINAL PEANUT (DISPOSABLE) IMPLANT
SPONGE LAP 18X18 RF (DISPOSABLE) ×2 IMPLANT
SPONGE LAP 18X18 X RAY DECT (DISPOSABLE) ×1 IMPLANT
SPONGE LAP 4X18 RFD (DISPOSABLE) ×6 IMPLANT
SUPPORT HEART JANKE-BARRON (MISCELLANEOUS) ×3 IMPLANT
SUT BONE WAX W31G (SUTURE) ×3 IMPLANT
SUT ETHIBON 2 0 V 52N 30 (SUTURE) ×6 IMPLANT
SUT ETHIBON EXCEL 2-0 V-5 (SUTURE) IMPLANT
SUT ETHIBOND 2 0 SH (SUTURE) ×7 IMPLANT
SUT ETHIBOND 2 0 SH 36X2 (SUTURE) IMPLANT
SUT ETHIBOND 4 0 RB 1 (SUTURE) ×5 IMPLANT
SUT ETHIBOND V-5 VALVE (SUTURE) IMPLANT
SUT MNCRL AB 4-0 PS2 18 (SUTURE) ×2 IMPLANT
SUT PROLENE 3 0 SH 1 (SUTURE) ×3 IMPLANT
SUT PROLENE 3 0 SH 48 (SUTURE) ×2 IMPLANT
SUT PROLENE 3 0 SH DA (SUTURE) ×9 IMPLANT
SUT PROLENE 3 0 SH1 36 (SUTURE) ×3 IMPLANT
SUT PROLENE 4 0 RB 1 (SUTURE) ×48
SUT PROLENE 4 0 SH DA (SUTURE) IMPLANT
SUT PROLENE 4-0 RB1 .5 CRCL 36 (SUTURE) ×8 IMPLANT
SUT PROLENE 5 0 C 1 36 (SUTURE) ×3 IMPLANT
SUT PROLENE 5 0 RB 2 (SUTURE) ×4 IMPLANT
SUT PROLENE 6 0 C 1 30 (SUTURE) ×5 IMPLANT
SUT PROLENE 7 0 BV 1 (SUTURE) ×1 IMPLANT
SUT PROLENE 7 0 BV1 MDA (SUTURE) ×3 IMPLANT
SUT PROLENE 8 0 BV175 6 (SUTURE) IMPLANT
SUT SILK  1 MH (SUTURE) ×3
SUT SILK 1 MH (SUTURE) IMPLANT
SUT SILK 1 TIES 10X30 (SUTURE) ×1 IMPLANT
SUT SILK 2 0 SH (SUTURE) IMPLANT
SUT SILK 2 0 SH CR/8 (SUTURE) ×2 IMPLANT
SUT STEEL 6MS V (SUTURE) IMPLANT
SUT STEEL STERNAL CCS#1 18IN (SUTURE) IMPLANT
SUT STEEL SZ 6 DBL 3X14 BALL (SUTURE) ×4 IMPLANT
SUT VIC AB 1 CTX 36 (SUTURE) ×6
SUT VIC AB 1 CTX36XBRD ANBCTR (SUTURE) ×4 IMPLANT
SUT VIC AB 2-0 CT1 27 (SUTURE) ×6
SUT VIC AB 2-0 CT1 TAPERPNT 27 (SUTURE) IMPLANT
SUT VIC AB 2-0 CTX 27 (SUTURE) IMPLANT
SUT VIC AB 3-0 SH 27 (SUTURE)
SUT VIC AB 3-0 SH 27X BRD (SUTURE) IMPLANT
SUT VIC AB 3-0 X1 27 (SUTURE) IMPLANT
SUT VICRYL 4-0 PS2 18IN ABS (SUTURE) IMPLANT
SYSTEM SAHARA CHEST DRAIN ATS (WOUND CARE) ×3 IMPLANT
TAPE CLOTH SOFT 2X10 (GAUZE/BANDAGES/DRESSINGS) ×1 IMPLANT
TAPE CLOTH SURG 4X10 WHT LF (GAUZE/BANDAGES/DRESSINGS) ×1 IMPLANT
TOWEL GREEN STERILE (TOWEL DISPOSABLE) ×4 IMPLANT
TOWEL GREEN STERILE FF (TOWEL DISPOSABLE) ×4 IMPLANT
TRAY FOLEY SLVR 14FR TEMP STAT (SET/KITS/TRAYS/PACK) ×2 IMPLANT
TRAY FOLEY SLVR 16FR TEMP STAT (SET/KITS/TRAYS/PACK) ×3 IMPLANT
TUBE CONNECTING 12X1/4 (SUCTIONS) ×1 IMPLANT
TUBE CONNECTING 20X1/4 (TUBING) ×1 IMPLANT
TUBING LAP HI FLOW INSUFFLATIO (TUBING) ×4 IMPLANT
UNDERPAD 30X30 (UNDERPADS AND DIAPERS) ×3 IMPLANT
VALVE AORTIC CRYO 25MM (Prosthesis & Implant Heart) ×3 IMPLANT
VENT LEFT HEART 12002 (CATHETERS) ×3
VRC MALLEABLE SINGLE STG 28FR (MISCELLANEOUS) ×3
VRC MALLEABLE SINGLE STG 36FR (MISCELLANEOUS) ×3
WATER STERILE IRR 1000ML POUR (IV SOLUTION) ×6 IMPLANT
YANKAUER SUCT BULB TIP NO VENT (SUCTIONS) ×1 IMPLANT

## 2019-09-26 NOTE — Anesthesia Preprocedure Evaluation (Addendum)
Anesthesia Evaluation  Patient identified by MRN, date of birth, ID band Patient awake    Reviewed: Allergy & Precautions, NPO status , Patient's Chart, lab work & pertinent test results  Airway Mallampati: II  TM Distance: >3 FB Neck ROM: Full    Dental no notable dental hx.    Pulmonary neg pulmonary ROS,    Pulmonary exam normal breath sounds clear to auscultation       Cardiovascular hypertension, + CAD and +CHF  + Valvular Problems/Murmurs AI  Rhythm:Regular Rate:Normal + Systolic murmurs 1. Tricuspid valve vegetation. Discussed with referring provider.  2. Left ventricular ejection fraction, by estimation, is 55 to 60%. The  left ventricle has normal function. The left ventricle has no regional  wall motion abnormalities. Left ventricular diastolic parameters were  normal.  3. Right ventricular systolic function is normal. The right ventricular  size is mildly enlarged. There is normal pulmonary artery systolic  pressure.  4. Left atrial size was moderately dilated.  5. Right atrial size was severely dilated.  6. The mitral valve is normal in structure. Mild mitral valve  regurgitation. No evidence of mitral stenosis.  7. There is a 1.8 x 1.1 cm tricuspid valve mass on the atrial surface  consistent with vegetation. . Tricuspid valve regurgitation is moderate.  8. Transaortic velocity is higher than expected for mechanical aortic  valve. The aortic valve is normal in structure. Aortic valve regurgitation  is not visualized. No aortic stenosis is present. There is a mechanical  valve present in the aortic  position. Procedure Date: 2000. Aortic valve mean gradient measures 33.5  mmHg. Aortic valve Vmax measures 3.68 m/s.  9. Aortic root/ascending aorta has been repaired/replaced and Bentall.  10. The inferior vena cava is normal in size with greater than 50%  respiratory variability, suggesting right atrial  pressure of 3 mmHg.   FINDINGS  Left Ventricle: Left ventricular ejection fraction, by estimation, is 55  to 60%. The left ventricle has normal function. The left ventricle has no  regional wall motion abnormalities. The left ventricular internal cavity  size was normal in size. There is  no left ventricular hypertrophy. Left ventricular diastolic parameters  were normal.   Right Ventricle: The right ventricular size is mildly enlarged. No  increase in right ventricular wall thickness. Right ventricular systolic  function is normal. There is normal pulmonary artery systolic pressure.  The tricuspid regurgitant velocity is 2.51  m/s, and with an assumed right atrial pressure of 3 mmHg, the estimated  right ventricular systolic pressure is 26.2 mmHg.   Left Atrium: Left atrial size was moderately dilated.   Right Atrium: Right atrial size was severely dilated.   Pericardium: There is no evidence of pericardial effusion.   Mitral Valve: The mitral valve is normal in structure. Normal mobility of  the mitral valve leaflets. Mild mitral annular calcification. Mild mitral  valve regurgitation. No evidence of mitral valve stenosis.   Tricuspid Valve: There is a 1.8 x 1.1 cm tricuspid valve mass on the  atrial surface consistent with vegetation. The tricuspid valve is normal  in structure. Tricuspid valve regurgitation is moderate . No evidence of  tricuspid stenosis.   Aortic Valve: Transaortic velocity is higher than expected for mechanical  aortic valve. The aortic valve is normal in structure. Aortic valve  regurgitation is not visualized. No aortic stenosis is present. Aortic  valve mean gradient measures 33.5 mmHg.  Aortic valve peak gradient measures 54.3 mmHg. Aortic valve area, by VTI  measures 0.93 cm. There is a mechanical valve present in the aortic  position. Procedure Date: 2000.   Pulmonic Valve: The pulmonic valve was normal in structure. Pulmonic valve   regurgitation is mild. No evidence of pulmonic stenosis.   Aorta: The aortic root/ascending aorta has been repaired/replaced and  Bentall.   Venous: The inferior vena cava is normal in size with greater than 50%  respiratory variability, suggesting right atrial pressure of 3 mmHg.   IAS/Shunts: No atrial level shunt detected by color flow Doppler.    Neuro/Psych negative neurological ROS  negative psych ROS   GI/Hepatic negative GI ROS, Neg liver ROS,   Endo/Other  diabetesHypothyroidism   Renal/GU negative Renal ROS  negative genitourinary   Musculoskeletal negative musculoskeletal ROS (+)   Abdominal   Peds negative pediatric ROS (+)  Hematology  (+) anemia , Multiple myeloma   Anesthesia Other Findings   Reproductive/Obstetrics negative OB ROS                            Anesthesia Physical Anesthesia Plan  ASA: IV  Anesthesia Plan: General   Post-op Pain Management:    Induction: Intravenous  PONV Risk Score and Plan: 0  Airway Management Planned: Oral ETT  Additional Equipment: Arterial line, CVP, PA Cath, TEE and Ultrasound Guidance Line Placement  Intra-op Plan: Delibrate Circulatory arrest per surgeon request  Post-operative Plan: Post-operative intubation/ventilation  Informed Consent: I have reviewed the patients History and Physical, chart, labs and discussed the procedure including the risks, benefits and alternatives for the proposed anesthesia with the patient or authorized representative who has indicated his/her understanding and acceptance.     Dental advisory given  Plan Discussed with: CRNA and Surgeon  Anesthesia Plan Comments: (Critically ill male with vegetations on tricuspid and mechanical AV)        Anesthesia Quick Evaluation

## 2019-09-26 NOTE — Anesthesia Procedure Notes (Signed)
Procedure Name: Intubation Date/Time: 09/26/2019 7:47 AM Performed by: Wilburn Cornelia, CRNA Pre-anesthesia Checklist: Patient identified, Emergency Drugs available, Suction available, Patient being monitored and Timeout performed Patient Re-evaluated:Patient Re-evaluated prior to induction Oxygen Delivery Method: Circle system utilized Preoxygenation: Pre-oxygenation with 100% oxygen Induction Type: IV induction Ventilation: Mask ventilation without difficulty Laryngoscope Size: Mac and 4 Grade View: Grade II Tube type: Oral Tube size: 8.0 mm Number of attempts: 1 Airway Equipment and Method: Stylet Placement Confirmation: ETT inserted through vocal cords under direct vision,  positive ETCO2,  CO2 detector and breath sounds checked- equal and bilateral Secured at: 24 cm Tube secured with: Tape Dental Injury: Teeth and Oropharynx as per pre-operative assessment

## 2019-09-26 NOTE — Plan of Care (Signed)

## 2019-09-26 NOTE — Anesthesia Procedure Notes (Signed)
Central Venous Catheter Insertion Performed by: Lillia Abed, MD, anesthesiologist Start/End5/10/2019 5:38 AM, 09/26/2019 5:48 AM Patient location: Pre-op. Preanesthetic checklist: patient identified, IV checked, risks and benefits discussed, surgical consent, monitors and equipment checked, pre-op evaluation, timeout performed and anesthesia consent Position: Trendelenburg Lidocaine 1% used for infiltration and patient sedated Hand hygiene performed  and maximum sterile barriers used  Catheter size: 8.5 Fr Central line and PA cath was placed.MAC introducer Swan type:thermodilution Procedure performed using ultrasound guided technique. Ultrasound Notes:anatomy identified, needle tip was noted to be adjacent to the nerve/plexus identified, no ultrasound evidence of intravascular and/or intraneural injection and image(s) printed for medical record Attempts: 1 Following insertion, line sutured, dressing applied and Biopatch. Post procedure assessment: blood return through all ports, free fluid flow and no air  Patient tolerated the procedure well with no immediate complications.

## 2019-09-26 NOTE — Brief Op Note (Signed)
09/08/2019 - 09/26/2019  2:57 PM  PATIENT:  [name]  54 y.o. male  PRE-OPERATIVE DIAGNOSIS:  1. ENDOCARDITIS  2. CORONARY ARTERY DISEASE  POST-OPERATIVE DIAGNOSIS:  1. ENDOCARDITIS  2. CORONARY ARTERY DISEASE  PROCEDURE: TRANSESOPHAGEAL ECHOCARDIOGRAM (TEE), REDO STERNOTOMY, DEBRIDEMENT OF PERIANNULAR ABSCESS, CLOSURE OF LVOT TO RIGHT ATRIAL FISTULA, REDO AORTIC ROOT  REPLACEMENT with HOMOGRAFT ( size 25 mm), REPLACEMENT OF ASCENDING AORTIC GRAFT USING A 30 MM HEMASHIELD GRAFT UNDER DEEP HYPOTHERMIC CIRCULATORY ARREST,  CORONARY ARTERY BYPASS GRAFTING (CABG) x  3 (SVG to MID RCA, SVG to LAD, SVG TO OM ) WITH EVH LEFT GREATER SAPHENOUS VEIN  FINDINGS: No vegetation on aortic valve on TEE in OR or by inspection. Periannular abscess along noncoronary sinus, Fistula present from LVOT to Right Atrium, Tricuspid valve had no vegetations and looked unremarkable so it was not replaced. The fistula was just superior to the septal leaflet but did not involve it. I suspect this was what looked like vegetation on TEE since there was an abscess there. 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 The left main was very thin and friable and could not be repaired. Complete calcification of aortic grafts with dense adhesions to all tissues.  SURGEON:  Surgeon(s) and Role:    Jospeh Mangel, Fernande Boyden, MD - Primary  PHYSICIAN ASSISTANTS: 1. Lars Pinks PA-C 2. Erin Barrett PA-C 3. Ezzard Flax PA-C  ASSISTANTS: Magda Kiel RNFA  ANESTHESIA:   general  EBL:  3000 cc    DRAINS: Chest tubes placed in the mediastinal and pleural spaces   SPECIMEN:  Source of Specimen:  Mechanical AVR, TV mass (endocarditis)  DISPOSITION OF SPECIMEN:  PATHOLOGY   COUNTS CORRECT:  YES  DICTATION: .Dragon Dictation  PLAN OF CARE: Admit to inpatient   PATIENT DISPOSITION:  ICU - intubated and critically ill.   Delay start of Pharmacological VTE agent  (>24hrs) due to surgical blood loss or risk of bleeding: yes  BASELINE WEIGHT: 132 kg

## 2019-09-26 NOTE — Progress Notes (Signed)
PROGRESS NOTE    [name]  OMV:[id number] DOB: 05/24/1965 DOA: 09/08/2019 PCP: Biagio Borg, MD   Brief Narrative:  54 y.o.malewith history of CAD s/p CABG in 2000, SVG-RCA PCI with BMS in Feb 2015, he had re-do PCI in June 2019 DES to SVG graft, ascending aortic dissection in 2000 s/p repair with AVR on warfarin. Paroxysmal A. fib. NIDDM-2, HTN and HLD presenting with 5 days of chills, nausea, vomiting, diarrhea and dyspnea on exertion and orthopnea mainly with lying on the left side. He's been found to have infective endocarditis with strep infantarius bacteremia.  He had a TEE showing large vegetations on the septal leaflet of the tricuspid valve and on the posterior disc of the aortic valve.  He's on ceftriaxone/gentamicin for his endocarditis at this point.  ID and cardiology are following.  He was seen by dental medicine 4/27 for multiple tooth extraction, alveoloplasty, debridement.  He's also been seen by surgery for a left buttocks abscess which has spontaneously drained and by orthopedics for L MTP pain and swelling which is thought to be gout.    Incidental T10 lesion finding, biopsy performed on 4/28 showed plasma cell neoplasm.  Oncology consulted. Dr. Cyndia Bent from [facility] planning for surgery on Thursday 5/6.  Assessment & Plan:   Principal Problem:   Suspected endocarditis mechanical aortic valve Active Problems:   Diabetes mellitus type II, non insulin dependent (HCC)   Hyperlipidemia   Essential hypertension   PAF (paroxysmal atrial fibrillation) (HCC)   Hypothyroidism   Normocytic anemia   Shortness of breath   Obesity (BMI 30-39.9)   CAD (coronary artery disease)   Nausea vomiting and diarrhea   Streptococcal bacteremia   Endocarditis of tricuspid valve   Plasmacytoma (HCC)   Infective endocarditis -tricuspid vegetation with regurgitation strep Infantarius Bacteremia:  - Per infectious disease continue gentamycin/ceftriaxone given prosthetic heart  valve - will likely need to 'restart' antibiotic clock post operatively. - CT surgery plans for redo sternotomy, aortic root replacement today - patient unavailable for interview/evaluation today while in the OR - Continue heparin drip, lower target 0.3.  Back pain fluctuant mass around mid back, hematoma Lytic lesion at T10, plasma cell neoplasm - CT chest performed, no evidence of active bleeding.   - New diagnosis, incidental finding of T10 lesion on MRI underwent biopsy 4/28 resulted on 5/3 evening.  Oncology team following for further work-up. - Per Oncology - whole body skeletal bone survey/bone marrow biopsy and aspirate by interventional radiology/myeloma panel pending  Bleeding gums with poor dentition status post tooth extraction -Subsided, back on heparin drip. Seen by dentist.  Left Buttocks Abscess: Spontaneous drainage.  Seen by general surgery.  Flagyl discontinued 4/29.  Routine dressing per surgery and wound care  Left MTP Pain, concerning for gout continue colchicine.  Seen by orthopedic no further recommendations.  Iron Def Anemia:  Iron supplements ongoing B12, folate-normal  CAD s/p CABG in 2000, SVG-RCA PCI with BMS in Feb 2015, he had re-do PCI in June 2019 DES to SVG graft: Echocardiogram as above. -Management per cardiology and CT surgery  Ascending aortic dissection in 2000 s/p repair with Mechanical AVR on warfarin.  -On Heparin -Monitor fluid status and renal function.  Paroxysmal A. fib.  -Continue heparin drip.  Uncontrolled NIDDM-2 with hyperglycemia: A1c 8.2%. -ContinueSSI-moderate -Levemir 6 units twice daily, NovoLog 6 units 3 times daily -Continue statin.   CKD stage IIIa (vancomycin, Lasix and losartan) -Baseline creatinine 1.2-1.3. -Holding metoprolol, Lasix and losartan -Continue monitoring  Essential HTN: -As needed as needed.  Currently home meds on hold  HLD  -Continue atorvastatin.  Hypothyroidism: -Continue  home Synthroid.  Morbid obesity: - Body mass index is 36.03 kg/m.With comorbidities as above. - Encourage lifestyle change to lose weight.  Would benefit from outpatient sleep study   DVT prophylaxis: Heparin drip, currently paused Code Status: Full code Family Communication: None at bedside Disposition Plan:   Patient From= home  Patient Anticipated D/C place= to be determined  Barriers= plan CT surgery but currently postponed due to, bleeding, maintain hospital stay  Subjective: No acute issues/events reported overnight  Examination: Unable to evaluate patient given location/accessibility in the OR  Objective: Vitals:   09/25/19 1458 09/25/19 2139 09/26/19 0426 09/26/19 0430  BP: (!) 140/99 131/85 128/86   Pulse: 100 85 93   Resp: '15 18 20   '$ Temp: 98.4 F (36.9 C) 98.5 F (36.9 C) 98.5 F (36.9 C)   TempSrc: Oral Oral Oral   SpO2: 98% 98% 100%   Weight:    132 kg  Height:        Intake/Output Summary (Last 24 hours) at 09/26/2019 0757 Last data filed at 09/26/2019 0519 Gross per 24 hour  Intake 984.74 ml  Output 1675 ml  Net -690.26 ml   Filed Weights   09/24/19 0559 09/25/19 0659 09/26/19 0430  Weight: 130.6 kg 130.9 kg 132 kg     Data Reviewed:   CBC: Recent Labs  Lab 09/22/19 0541 09/23/19 0458 09/24/19 0427 09/25/19 0533 09/26/19 0458  WBC 6.9 8.1 6.4 5.4 5.3  NEUTROABS  --   --   --  3.3  --   HGB 8.8* 9.3* 8.8* 8.5* 8.4*  HCT 25.7* 26.7* 25.5* 24.7* 24.4*  MCV 84.0 83.7 84.7 85.5 85.9  PLT 184 199 175 151 790*   Basic Metabolic Panel: Recent Labs  Lab 09/22/19 0541 09/23/19 0458 09/24/19 0427 09/25/19 0533 09/26/19 0458  NA 139 139 139 139 140  K 3.7 3.6 3.6 3.6 3.9  CL 109 108 110 110 107  CO2 '23 24 22 22 23  '$ GLUCOSE 136* 142* 155* 141* 146*  BUN '12 12 14 12 14  '$ CREATININE 1.38* 1.30* 1.30* 1.27* 1.23  CALCIUM 8.7* 9.0 9.1 8.7* 8.7*   GFR: Estimated Creatinine Clearance: 99.1 mL/min (by C-G formula based on SCr of 1.23  mg/dL). Liver Function Tests: Recent Labs  Lab 09/26/19 0458  AST 37  ALT 25  ALKPHOS 73  BILITOT 0.8  PROT 7.1  ALBUMIN 2.7*   No results for input(s): LIPASE, AMYLASE in the last 168 hours. No results for input(s): AMMONIA in the last 168 hours. Coagulation Profile: Recent Labs  Lab 09/22/19 0541 09/23/19 0458 09/24/19 0427 09/25/19 0533 09/26/19 0458  INR 1.4* 1.3* 1.2 1.2 1.2   Cardiac Enzymes: No results for input(s): CKTOTAL, CKMB, CKMBINDEX, TROPONINI in the last 168 hours. BNP (last 3 results) No results for input(s): PROBNP in the last 8760 hours. HbA1C: Recent Labs    09/26/19 0458  HGBA1C 7.3*   CBG: Recent Labs  Lab 09/25/19 0748 09/25/19 1103 09/25/19 1717 09/25/19 2050 09/26/19 0542  GLUCAP 127* 107* 144* 172* 132*   Lipid Profile: No results for input(s): CHOL, HDL, LDLCALC, TRIG, CHOLHDL, LDLDIRECT in the last 72 hours. Thyroid Function Tests: No results for input(s): TSH, T4TOTAL, FREET4, T3FREE, THYROIDAB in the last 72 hours. Anemia Panel: No results for input(s): VITAMINB12, FOLATE, FERRITIN, TIBC, IRON, RETICCTPCT in the last 72 hours. Sepsis Labs: No  results for input(s): PROCALCITON, LATICACIDVEN in the last 168 hours.  Recent Results (from the past 240 hour(s))  Surgical PCR screen     Status: None   Collection Time: 09/16/19  3:34 PM   Specimen: Nasal Mucosa; Nasal Swab  Result Value Ref Range Status   MRSA, PCR NEGATIVE NEGATIVE Final   Staphylococcus aureus NEGATIVE NEGATIVE Final    Comment: (NOTE) The Xpert SA Assay (FDA approved for NASAL specimens in patients 46 years of age and older), is one component of a comprehensive surveillance program. It is not intended to diagnose infection nor to guide or monitor treatment. Performed at [facility], [place] [address], [place], [place] [address]   Surgical pcr screen     Status: None   Collection Time: 09/23/19  4:39 AM   Specimen: Nasal Mucosa; Nasal Swab  Result  Value Ref Range Status   MRSA, PCR NEGATIVE NEGATIVE Final   Staphylococcus aureus NEGATIVE NEGATIVE Final    Comment: (NOTE) The Xpert SA Assay (FDA approved for NASAL specimens in patients 88 years of age and older), is one component of a comprehensive surveillance program. It is not intended to diagnose infection nor to guide or monitor treatment. Performed at [facility], [place] [address] [place], [place] [address]      Radiology Studies: DG Chest 2 View  Result Date: 09/25/2019 CLINICAL DATA:  Plasma cell neoplasm, preoperative evaluation EXAM: CHEST - 2 VIEW COMPARISON:  09/08/2019 FINDINGS: Frontal and lateral views of the chest demonstrate postsurgical changes from aortic valve replacement. Cardiac silhouette is enlarged. Mild chronic central vascular congestion without airspace disease, effusion, or pneumothorax. The lytic lesion involving the T10 vertebral body is not well appreciated on portable chest x-ray. IMPRESSION: 1. Enlarged cardiac silhouette. 2. Chronic central vascular congestion.  No acute airspace disease. Electronically Signed   By: Randa Ngo M.D.   On: 09/25/2019 21:38   DG Bone Survey Met  Result Date: 09/25/2019 CLINICAL DATA:  Plasma cell neoplasm, in-patient. EXAM: METASTATIC BONE SURVEY COMPARISON:  None. FINDINGS: Skull: Normal bony mineralization. No focal lytic or blastic osseous lesion. Cervical Spine: Normal bony mineralization. No focal lytic or blastic osseous lesion. Thoracic Spine: Normal bony mineralization. No focal lytic or blastic osseous lesion. T10 vertebral body lesion is better demonstrated on recent chest CT. Chest: Normal bony mineralization. No focal lytic or blastic osseous lesion. Lumbar Spine: Normal bony mineralization. No focal lytic or blastic osseous lesion. 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 Pelvis: Normal bony mineralization. No focal lytic or  blastic osseous lesion. Right Upper Extremity: Small lytic appearing lesions within the proximal and mid RIGHT humerus, each measuring approximately 5-6 mm greatest dimension. Left Upper Extremity: Normal bony mineralization. No focal lytic or blastic osseous lesion. Right Lower Extremity: Normal bony mineralization. No focal lytic or blastic osseous lesion. Left Lower Extremity: Normal bony mineralization. No focal lytic or blastic osseous lesion. IMPRESSION: 1. Two small lytic appearing lesions within the proximal and mid RIGHT humerus, measuring approximately 5-6 mm greatest dimension, suspicious for foci of metastatic disease/multiple myeloma. 2. Aggressive appearing lytic lesion within the T10 vertebral body is better demonstrated on recent chest CT. 3. Otherwise normal bone survey. No additional evidence of metastatic disease. 4. 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 Electronically Signed   By: Franki Cabot M.D.   On: 09/25/2019 09:32   CT  BONE MARROW BIOPSY & ASPIRATION  Result Date: 09/25/2019 CLINICAL DATA:  Plasma cell neoplasm EXAM: CT GUIDED DEEP ILIAC BONE ASPIRATION AND CORE BIOPSY TECHNIQUE: Patient was placed prone on the CT gantry and limited axial scans through the pelvis were obtained. Appropriate skin entry site was identified. Skin site was marked, prepped with chlorhexidine, draped in usual sterile fashion, and infiltrated locally with 1% lidocaine. Intravenous Fentanyl 14mg and Versed '2mg'$  were administered as conscious sedation during continuous monitoring of the patient's level of consciousness and physiological / cardiorespiratory status by the radiology RN, with a total moderate sedation time of 10 minutes. Under CT fluoroscopic guidance an 11-gauge Cook trocar bone needle was advanced into the left iliac bone just lateral to the sacroiliac joint. Once needle tip position was confirmed, core and  aspiration samples were obtained, submitted to pathology for approval. Post procedure scans show no hematoma or fracture. Patient tolerated procedure well. COMPLICATIONS: COMPLICATIONS none IMPRESSION: 1. Technically successful CT guided left iliac bone core and aspiration biopsy. Electronically Signed   By: DLucrezia EuropeM.D.   On: 09/25/2019 12:24    Scheduled Meds: . [MAR Hold] aspirin EC  81 mg Oral Daily  . [MAR Hold] atorvastatin  40 mg Oral Q breakfast  . [MAR Hold] carvedilol  3.125 mg Oral BID WC  . [MAR Hold] colchicine  0.6 mg Oral BID  . [MAR Hold] feeding supplement  1 Container Oral BID BM  . [MAR Hold] feeding supplement (ENSURE ENLIVE)  237 mL Oral BID BM  . [MAR Hold] feeding supplement (PRO-STAT SUGAR FREE 64)  30 mL Oral BID  . [MAR Hold] gabapentin  300 mg Oral Q breakfast  . [MAR Hold] insulin aspart  0-15 Units Subcutaneous TID WC  . [MAR Hold] insulin aspart  0-5 Units Subcutaneous QHS  . [MAR Hold] insulin aspart  6 Units Subcutaneous TID WC  . [MAR Hold] insulin detemir  6 Units Subcutaneous BID  . [MAR Hold] levothyroxine  50 mcg Oral Q0600  . [MAR Hold] multivitamin with minerals  1 tablet Oral Daily  . [MAR Hold] polyethylene glycol  17 g Oral BID  . [MAR Hold] sodium chloride flush  10-40 mL Intracatheter Q12H  . [MAR Hold] sodium chloride flush  3 mL Intravenous Once  . [MAR Hold] sodium chloride flush  3 mL Intravenous Q12H   Continuous Infusions: . [MAR Hold] sodium chloride    . [MAR Hold] cefTRIAXone (ROCEPHIN)  IV 2 g (09/25/19 1146)  . [MAR Hold] gentamicin 220 mg (09/25/19 1405)  . heparin 1,900 Units/hr (09/25/19 2312)  . lactated ringers       LOS: 18 days   Time spent= 15 mins  WLittle Ishikawa DO [facility]  If 7PM-7AM, please contact night-coverage  09/26/2019, 7:57 AM

## 2019-09-26 NOTE — Anesthesia Procedure Notes (Signed)
Arterial Line Insertion Start/End5/10/2019 6:45 AM, 09/26/2019 6:55 AM Performed by: Gaylene Brooks, CRNA, CRNA  Preanesthetic checklist: patient identified, IV checked, site marked, risks and benefits discussed, surgical consent, monitors and equipment checked, pre-op evaluation, timeout performed and anesthesia consent Lidocaine 1% used for infiltration and patient sedated Left, radial was placed Catheter size: 20 G Hand hygiene performed  and maximum sterile barriers used  Allen's test indicative of satisfactory collateral circulation Attempts: 2 Procedure performed without using ultrasound guided technique. Following insertion, dressing applied and Biopatch. Post procedure assessment: normal  Patient tolerated the procedure well with no immediate complications.

## 2019-09-26 NOTE — Anesthesia Procedure Notes (Signed)
Arterial Line Insertion Start/End5/10/2019 6:55 AM, 09/26/2019 7:05 AM Performed by: Josephine Igo, CRNA, CRNA  Patient location: Pre-op. Preanesthetic checklist: patient identified, IV checked, site marked, risks and benefits discussed, surgical consent, monitors and equipment checked, pre-op evaluation, timeout performed and anesthesia consent Lidocaine 1% used for infiltration and patient sedated Right, radial was placed Catheter size: 20 G Hand hygiene performed  and maximum sterile barriers used  Allen's test indicative of satisfactory collateral circulation Attempts: 4 Procedure performed without using ultrasound guided technique. Following insertion, dressing applied and Biopatch. Post procedure assessment: normal  Patient tolerated the procedure well with no immediate complications.

## 2019-09-27 ENCOUNTER — Inpatient Hospital Stay (HOSPITAL_COMMUNITY): Payer: Medicare PPO

## 2019-09-27 ENCOUNTER — Encounter: Payer: Self-pay | Admitting: *Deleted

## 2019-09-27 LAB — POCT I-STAT 7, (LYTES, BLD GAS, ICA,H+H)
Acid-Base Excess: 2 mmol/L (ref 0.0–2.0)
Acid-Base Excess: 1 mmol/L (ref 0.0–2.0)
Acid-Base Excess: 0 mmol/L (ref 0.0–2.0)
Acid-Base Excess: 2 mmol/L (ref 0.0–2.0)
Acid-base deficit: 1 mmol/L (ref 0.0–2.0)
Acid-base deficit: 1 mmol/L (ref 0.0–2.0)
Acid-base deficit: 2 mmol/L (ref 0.0–2.0)
Acid-base deficit: 2 mmol/L (ref 0.0–2.0)
Acid-base deficit: 2 mmol/L (ref 0.0–2.0)
Acid-base deficit: 7 mmol/L — ABNORMAL HIGH (ref 0.0–2.0)
Acid-base deficit: 3 mmol/L — ABNORMAL HIGH (ref 0.0–2.0)
Acid-base deficit: 5 mmol/L — ABNORMAL HIGH (ref 0.0–2.0)
Acid-base deficit: 1 mmol/L (ref 0.0–2.0)
Bicarbonate: 24.2 mmol/L (ref 20.0–28.0)
Bicarbonate: 26.3 mmol/L (ref 20.0–28.0)
Bicarbonate: 25.7 mmol/L (ref 20.0–28.0)
Bicarbonate: 23.9 mmol/L (ref 20.0–28.0)
Bicarbonate: 23.9 mmol/L (ref 20.0–28.0)
Bicarbonate: 24.2 mmol/L (ref 20.0–28.0)
Bicarbonate: 24 mmol/L (ref 20.0–28.0)
Bicarbonate: 23.3 mmol/L (ref 20.0–28.0)
Bicarbonate: 21.2 mmol/L (ref 20.0–28.0)
Bicarbonate: 26.3 mmol/L (ref 20.0–28.0)
Bicarbonate: 21.8 mmol/L (ref 20.0–28.0)
Bicarbonate: 19.8 mmol/L — ABNORMAL LOW (ref 20.0–28.0)
Bicarbonate: 24.1 mmol/L (ref 20.0–28.0)
Calcium, Ion: 1.26 mmol/L (ref 1.15–1.40)
Calcium, Ion: 1.08 mmol/L — ABNORMAL LOW (ref 1.15–1.40)
Calcium, Ion: 1.16 mmol/L (ref 1.15–1.40)
Calcium, Ion: 1.13 mmol/L — ABNORMAL LOW (ref 1.15–1.40)
Calcium, Ion: 1.16 mmol/L (ref 1.15–1.40)
Calcium, Ion: 1.19 mmol/L (ref 1.15–1.40)
Calcium, Ion: 1.12 mmol/L — ABNORMAL LOW (ref 1.15–1.40)
Calcium, Ion: 1.14 mmol/L — ABNORMAL LOW (ref 1.15–1.40)
Calcium, Ion: 1.13 mmol/L — ABNORMAL LOW (ref 1.15–1.40)
Calcium, Ion: 1 mmol/L — ABNORMAL LOW (ref 1.15–1.40)
Calcium, Ion: 0.93 mmol/L — ABNORMAL LOW (ref 1.15–1.40)
Calcium, Ion: 1.26 mmol/L (ref 1.15–1.40)
Calcium, Ion: 1.26 mmol/L (ref 1.15–1.40)
HCT: 25 % — ABNORMAL LOW (ref 39.0–52.0)
HCT: 22 % — ABNORMAL LOW (ref 39.0–52.0)
HCT: 26 % — ABNORMAL LOW (ref 39.0–52.0)
HCT: 26 % — ABNORMAL LOW (ref 39.0–52.0)
HCT: 25 % — ABNORMAL LOW (ref 39.0–52.0)
HCT: 26 % — ABNORMAL LOW (ref 39.0–52.0)
HCT: 24 % — ABNORMAL LOW (ref 39.0–52.0)
HCT: 29 % — ABNORMAL LOW (ref 39.0–52.0)
HCT: 30 % — ABNORMAL LOW (ref 39.0–52.0)
HCT: 22 % — ABNORMAL LOW (ref 39.0–52.0)
HCT: 22 % — ABNORMAL LOW (ref 39.0–52.0)
HCT: 19 % — ABNORMAL LOW (ref 39.0–52.0)
HCT: 20 % — ABNORMAL LOW (ref 39.0–52.0)
Hemoglobin: 8.5 g/dL — ABNORMAL LOW (ref 13.0–17.0)
Hemoglobin: 7.5 g/dL — ABNORMAL LOW (ref 13.0–17.0)
Hemoglobin: 8.8 g/dL — ABNORMAL LOW (ref 13.0–17.0)
Hemoglobin: 8.8 g/dL — ABNORMAL LOW (ref 13.0–17.0)
Hemoglobin: 8.5 g/dL — ABNORMAL LOW (ref 13.0–17.0)
Hemoglobin: 8.8 g/dL — ABNORMAL LOW (ref 13.0–17.0)
Hemoglobin: 8.2 g/dL — ABNORMAL LOW (ref 13.0–17.0)
Hemoglobin: 9.9 g/dL — ABNORMAL LOW (ref 13.0–17.0)
Hemoglobin: 10.2 g/dL — ABNORMAL LOW (ref 13.0–17.0)
Hemoglobin: 7.5 g/dL — ABNORMAL LOW (ref 13.0–17.0)
Hemoglobin: 7.5 g/dL — ABNORMAL LOW (ref 13.0–17.0)
Hemoglobin: 6.5 g/dL — CL (ref 13.0–17.0)
Hemoglobin: 6.8 g/dL — CL (ref 13.0–17.0)
O2 Saturation: 100 %
O2 Saturation: 100 %
O2 Saturation: 100 %
O2 Saturation: 100 %
O2 Saturation: 100 %
O2 Saturation: 100 %
O2 Saturation: 100 %
O2 Saturation: 100 %
O2 Saturation: 100 %
O2 Saturation: 100 %
O2 Saturation: 100 %
O2 Saturation: 95 %
O2 Saturation: 98 %
Patient temperature: 35.7
Patient temperature: 35.5
Potassium: 4 mmol/L (ref 3.5–5.1)
Potassium: 3.9 mmol/L (ref 3.5–5.1)
Potassium: 3.7 mmol/L (ref 3.5–5.1)
Potassium: 3.9 mmol/L (ref 3.5–5.1)
Potassium: 4.1 mmol/L (ref 3.5–5.1)
Potassium: 4.4 mmol/L (ref 3.5–5.1)
Potassium: 4.9 mmol/L (ref 3.5–5.1)
Potassium: 5.1 mmol/L (ref 3.5–5.1)
Potassium: 4.8 mmol/L (ref 3.5–5.1)
Potassium: 4.9 mmol/L (ref 3.5–5.1)
Potassium: 4.8 mmol/L (ref 3.5–5.1)
Potassium: 4.5 mmol/L (ref 3.5–5.1)
Potassium: 4.5 mmol/L (ref 3.5–5.1)
Sodium: 141 mmol/L (ref 135–145)
Sodium: 142 mmol/L (ref 135–145)
Sodium: 140 mmol/L (ref 135–145)
Sodium: 138 mmol/L (ref 135–145)
Sodium: 139 mmol/L (ref 135–145)
Sodium: 139 mmol/L (ref 135–145)
Sodium: 139 mmol/L (ref 135–145)
Sodium: 140 mmol/L (ref 135–145)
Sodium: 141 mmol/L (ref 135–145)
Sodium: 143 mmol/L (ref 135–145)
Sodium: 144 mmol/L (ref 135–145)
Sodium: 145 mmol/L (ref 135–145)
Sodium: 145 mmol/L (ref 135–145)
TCO2: 25 mmol/L (ref 22–32)
TCO2: 28 mmol/L (ref 22–32)
TCO2: 27 mmol/L (ref 22–32)
TCO2: 25 mmol/L (ref 22–32)
TCO2: 25 mmol/L (ref 22–32)
TCO2: 26 mmol/L (ref 22–32)
TCO2: 25 mmol/L (ref 22–32)
TCO2: 25 mmol/L (ref 22–32)
TCO2: 23 mmol/L (ref 22–32)
TCO2: 27 mmol/L (ref 22–32)
TCO2: 23 mmol/L (ref 22–32)
TCO2: 21 mmol/L — ABNORMAL LOW (ref 22–32)
TCO2: 25 mmol/L (ref 22–32)
pCO2 arterial: 40.4 mmHg (ref 32.0–48.0)
pCO2 arterial: 39.1 mmHg (ref 32.0–48.0)
pCO2 arterial: 43 mmHg (ref 32.0–48.0)
pCO2 arterial: 34.3 mmHg (ref 32.0–48.0)
pCO2 arterial: 38.8 mmHg (ref 32.0–48.0)
pCO2 arterial: 44.5 mmHg (ref 32.0–48.0)
pCO2 arterial: 46.3 mmHg (ref 32.0–48.0)
pCO2 arterial: 43.4 mmHg (ref 32.0–48.0)
pCO2 arterial: 53.2 mmHg — ABNORMAL HIGH (ref 32.0–48.0)
pCO2 arterial: 37.8 mmHg (ref 32.0–48.0)
pCO2 arterial: 36.1 mmHg (ref 32.0–48.0)
pCO2 arterial: 31.4 mmHg — ABNORMAL LOW (ref 32.0–48.0)
pCO2 arterial: 39.4 mmHg (ref 32.0–48.0)
pH, Arterial: 7.386 (ref 7.350–7.450)
pH, Arterial: 7.436 (ref 7.350–7.450)
pH, Arterial: 7.385 (ref 7.350–7.450)
pH, Arterial: 7.451 — ABNORMAL HIGH (ref 7.350–7.450)
pH, Arterial: 7.397 (ref 7.350–7.450)
pH, Arterial: 7.343 — ABNORMAL LOW (ref 7.350–7.450)
pH, Arterial: 7.323 — ABNORMAL LOW (ref 7.350–7.450)
pH, Arterial: 7.338 — ABNORMAL LOW (ref 7.350–7.450)
pH, Arterial: 7.207 — ABNORMAL LOW (ref 7.350–7.450)
pH, Arterial: 7.451 — ABNORMAL HIGH (ref 7.350–7.450)
pH, Arterial: 7.39 (ref 7.350–7.450)
pH, Arterial: 7.402 (ref 7.350–7.450)
pH, Arterial: 7.388 (ref 7.350–7.450)
pO2, Arterial: 434 mmHg — ABNORMAL HIGH (ref 83.0–108.0)
pO2, Arterial: 451 mmHg — ABNORMAL HIGH (ref 83.0–108.0)
pO2, Arterial: 394 mmHg — ABNORMAL HIGH (ref 83.0–108.0)
pO2, Arterial: 355 mmHg — ABNORMAL HIGH (ref 83.0–108.0)
pO2, Arterial: 354 mmHg — ABNORMAL HIGH (ref 83.0–108.0)
pO2, Arterial: 367 mmHg — ABNORMAL HIGH (ref 83.0–108.0)
pO2, Arterial: 371 mmHg — ABNORMAL HIGH (ref 83.0–108.0)
pO2, Arterial: 342 mmHg — ABNORMAL HIGH (ref 83.0–108.0)
pO2, Arterial: 261 mmHg — ABNORMAL HIGH (ref 83.0–108.0)
pO2, Arterial: 248 mmHg — ABNORMAL HIGH (ref 83.0–108.0)
pO2, Arterial: 309 mmHg — ABNORMAL HIGH (ref 83.0–108.0)
pO2, Arterial: 71 mmHg — ABNORMAL LOW (ref 83.0–108.0)
pO2, Arterial: 98 mmHg (ref 83.0–108.0)

## 2019-09-27 LAB — BPAM FFP
Blood Product Expiration Date: 202105112359
Blood Product Expiration Date: 202105112359
ISSUE DATE / TIME: 202105062047
ISSUE DATE / TIME: 202105062047
Unit Type and Rh: 600
Unit Type and Rh: 6200

## 2019-09-27 LAB — UPEP/UIFE/LIGHT CHAINS/TP, 24-HR UR
% BETA, Urine: 14.7 %
ALPHA 1 URINE: 4.4 %
Albumin, U: 29.9 %
Alpha 2, Urine: 16.5 %
Free Kappa Lt Chains,Ur: 715.55 mg/L — ABNORMAL HIGH (ref 0.63–113.79)
Free Kappa/Lambda Ratio: 11.47 (ref 1.03–31.76)
Free Lambda Lt Chains,Ur: 62.39 mg/L — ABNORMAL HIGH (ref 0.47–11.77)
GAMMA GLOBULIN URINE: 34.5 %
Total Protein, Urine-Ur/day: 449 mg/24 hr — ABNORMAL HIGH (ref 30–150)
Total Protein, Urine: 49.9 mg/dL

## 2019-09-27 LAB — POCT I-STAT, CHEM 8
BUN: 13 mg/dL (ref 6–20)
BUN: 13 mg/dL (ref 6–20)
BUN: 13 mg/dL (ref 6–20)
BUN: 14 mg/dL (ref 6–20)
BUN: 14 mg/dL (ref 6–20)
BUN: 15 mg/dL (ref 6–20)
BUN: 15 mg/dL (ref 6–20)
BUN: 15 mg/dL (ref 6–20)
BUN: 15 mg/dL (ref 6–20)
BUN: 15 mg/dL (ref 6–20)
BUN: 16 mg/dL (ref 6–20)
BUN: 16 mg/dL (ref 6–20)
BUN: 16 mg/dL (ref 6–20)
BUN: 16 mg/dL (ref 6–20)
BUN: 16 mg/dL (ref 6–20)
BUN: 16 mg/dL (ref 6–20)
Calcium, Ion: 0.94 mmol/L — ABNORMAL LOW (ref 1.15–1.40)
Calcium, Ion: 0.96 mmol/L — ABNORMAL LOW (ref 1.15–1.40)
Calcium, Ion: 0.98 mmol/L — ABNORMAL LOW (ref 1.15–1.40)
Calcium, Ion: 1.09 mmol/L — ABNORMAL LOW (ref 1.15–1.40)
Calcium, Ion: 1.11 mmol/L — ABNORMAL LOW (ref 1.15–1.40)
Calcium, Ion: 1.11 mmol/L — ABNORMAL LOW (ref 1.15–1.40)
Calcium, Ion: 1.12 mmol/L — ABNORMAL LOW (ref 1.15–1.40)
Calcium, Ion: 1.15 mmol/L (ref 1.15–1.40)
Calcium, Ion: 1.15 mmol/L (ref 1.15–1.40)
Calcium, Ion: 1.16 mmol/L (ref 1.15–1.40)
Calcium, Ion: 1.16 mmol/L (ref 1.15–1.40)
Calcium, Ion: 1.17 mmol/L (ref 1.15–1.40)
Calcium, Ion: 1.19 mmol/L (ref 1.15–1.40)
Calcium, Ion: 1.19 mmol/L (ref 1.15–1.40)
Calcium, Ion: 1.25 mmol/L (ref 1.15–1.40)
Calcium, Ion: 1.28 mmol/L (ref 1.15–1.40)
Chloride: 103 mmol/L (ref 98–111)
Chloride: 104 mmol/L (ref 98–111)
Chloride: 105 mmol/L (ref 98–111)
Chloride: 105 mmol/L (ref 98–111)
Chloride: 105 mmol/L (ref 98–111)
Chloride: 105 mmol/L (ref 98–111)
Chloride: 106 mmol/L (ref 98–111)
Chloride: 106 mmol/L (ref 98–111)
Chloride: 106 mmol/L (ref 98–111)
Chloride: 106 mmol/L (ref 98–111)
Chloride: 107 mmol/L (ref 98–111)
Chloride: 107 mmol/L (ref 98–111)
Chloride: 108 mmol/L (ref 98–111)
Chloride: 109 mmol/L (ref 98–111)
Chloride: 109 mmol/L (ref 98–111)
Chloride: 110 mmol/L (ref 98–111)
Creatinine, Ser: 1.1 mg/dL (ref 0.61–1.24)
Creatinine, Ser: 1.2 mg/dL (ref 0.61–1.24)
Creatinine, Ser: 1.2 mg/dL (ref 0.61–1.24)
Creatinine, Ser: 1.2 mg/dL (ref 0.61–1.24)
Creatinine, Ser: 1.2 mg/dL (ref 0.61–1.24)
Creatinine, Ser: 1.2 mg/dL (ref 0.61–1.24)
Creatinine, Ser: 1.3 mg/dL — ABNORMAL HIGH (ref 0.61–1.24)
Creatinine, Ser: 1.3 mg/dL — ABNORMAL HIGH (ref 0.61–1.24)
Creatinine, Ser: 1.3 mg/dL — ABNORMAL HIGH (ref 0.61–1.24)
Creatinine, Ser: 1.3 mg/dL — ABNORMAL HIGH (ref 0.61–1.24)
Creatinine, Ser: 1.3 mg/dL — ABNORMAL HIGH (ref 0.61–1.24)
Creatinine, Ser: 1.3 mg/dL — ABNORMAL HIGH (ref 0.61–1.24)
Creatinine, Ser: 1.3 mg/dL — ABNORMAL HIGH (ref 0.61–1.24)
Creatinine, Ser: 1.3 mg/dL — ABNORMAL HIGH (ref 0.61–1.24)
Creatinine, Ser: 1.3 mg/dL — ABNORMAL HIGH (ref 0.61–1.24)
Creatinine, Ser: 1.4 mg/dL — ABNORMAL HIGH (ref 0.61–1.24)
Glucose, Bld: 101 mg/dL — ABNORMAL HIGH (ref 70–99)
Glucose, Bld: 105 mg/dL — ABNORMAL HIGH (ref 70–99)
Glucose, Bld: 117 mg/dL — ABNORMAL HIGH (ref 70–99)
Glucose, Bld: 137 mg/dL — ABNORMAL HIGH (ref 70–99)
Glucose, Bld: 139 mg/dL — ABNORMAL HIGH (ref 70–99)
Glucose, Bld: 140 mg/dL — ABNORMAL HIGH (ref 70–99)
Glucose, Bld: 141 mg/dL — ABNORMAL HIGH (ref 70–99)
Glucose, Bld: 142 mg/dL — ABNORMAL HIGH (ref 70–99)
Glucose, Bld: 142 mg/dL — ABNORMAL HIGH (ref 70–99)
Glucose, Bld: 144 mg/dL — ABNORMAL HIGH (ref 70–99)
Glucose, Bld: 144 mg/dL — ABNORMAL HIGH (ref 70–99)
Glucose, Bld: 145 mg/dL — ABNORMAL HIGH (ref 70–99)
Glucose, Bld: 147 mg/dL — ABNORMAL HIGH (ref 70–99)
Glucose, Bld: 160 mg/dL — ABNORMAL HIGH (ref 70–99)
Glucose, Bld: 165 mg/dL — ABNORMAL HIGH (ref 70–99)
Glucose, Bld: 169 mg/dL — ABNORMAL HIGH (ref 70–99)
HCT: 20 % — ABNORMAL LOW (ref 39.0–52.0)
HCT: 21 % — ABNORMAL LOW (ref 39.0–52.0)
HCT: 21 % — ABNORMAL LOW (ref 39.0–52.0)
HCT: 23 % — ABNORMAL LOW (ref 39.0–52.0)
HCT: 23 % — ABNORMAL LOW (ref 39.0–52.0)
HCT: 24 % — ABNORMAL LOW (ref 39.0–52.0)
HCT: 24 % — ABNORMAL LOW (ref 39.0–52.0)
HCT: 24 % — ABNORMAL LOW (ref 39.0–52.0)
HCT: 24 % — ABNORMAL LOW (ref 39.0–52.0)
HCT: 25 % — ABNORMAL LOW (ref 39.0–52.0)
HCT: 25 % — ABNORMAL LOW (ref 39.0–52.0)
HCT: 26 % — ABNORMAL LOW (ref 39.0–52.0)
HCT: 26 % — ABNORMAL LOW (ref 39.0–52.0)
HCT: 26 % — ABNORMAL LOW (ref 39.0–52.0)
HCT: 26 % — ABNORMAL LOW (ref 39.0–52.0)
HCT: 26 % — ABNORMAL LOW (ref 39.0–52.0)
Hemoglobin: 6.8 g/dL — CL (ref 13.0–17.0)
Hemoglobin: 7.1 g/dL — ABNORMAL LOW (ref 13.0–17.0)
Hemoglobin: 7.1 g/dL — ABNORMAL LOW (ref 13.0–17.0)
Hemoglobin: 7.8 g/dL — ABNORMAL LOW (ref 13.0–17.0)
Hemoglobin: 7.8 g/dL — ABNORMAL LOW (ref 13.0–17.0)
Hemoglobin: 8.2 g/dL — ABNORMAL LOW (ref 13.0–17.0)
Hemoglobin: 8.2 g/dL — ABNORMAL LOW (ref 13.0–17.0)
Hemoglobin: 8.2 g/dL — ABNORMAL LOW (ref 13.0–17.0)
Hemoglobin: 8.2 g/dL — ABNORMAL LOW (ref 13.0–17.0)
Hemoglobin: 8.5 g/dL — ABNORMAL LOW (ref 13.0–17.0)
Hemoglobin: 8.5 g/dL — ABNORMAL LOW (ref 13.0–17.0)
Hemoglobin: 8.8 g/dL — ABNORMAL LOW (ref 13.0–17.0)
Hemoglobin: 8.8 g/dL — ABNORMAL LOW (ref 13.0–17.0)
Hemoglobin: 8.8 g/dL — ABNORMAL LOW (ref 13.0–17.0)
Hemoglobin: 8.8 g/dL — ABNORMAL LOW (ref 13.0–17.0)
Hemoglobin: 8.8 g/dL — ABNORMAL LOW (ref 13.0–17.0)
Potassium: 3.7 mmol/L (ref 3.5–5.1)
Potassium: 3.8 mmol/L (ref 3.5–5.1)
Potassium: 3.8 mmol/L (ref 3.5–5.1)
Potassium: 3.9 mmol/L (ref 3.5–5.1)
Potassium: 4 mmol/L (ref 3.5–5.1)
Potassium: 4 mmol/L (ref 3.5–5.1)
Potassium: 4.2 mmol/L (ref 3.5–5.1)
Potassium: 4.4 mmol/L (ref 3.5–5.1)
Potassium: 4.4 mmol/L (ref 3.5–5.1)
Potassium: 4.5 mmol/L (ref 3.5–5.1)
Potassium: 4.5 mmol/L (ref 3.5–5.1)
Potassium: 4.6 mmol/L (ref 3.5–5.1)
Potassium: 4.7 mmol/L (ref 3.5–5.1)
Potassium: 4.8 mmol/L (ref 3.5–5.1)
Potassium: 4.8 mmol/L (ref 3.5–5.1)
Potassium: 5.1 mmol/L (ref 3.5–5.1)
Sodium: 138 mmol/L (ref 135–145)
Sodium: 139 mmol/L (ref 135–145)
Sodium: 139 mmol/L (ref 135–145)
Sodium: 139 mmol/L (ref 135–145)
Sodium: 139 mmol/L (ref 135–145)
Sodium: 139 mmol/L (ref 135–145)
Sodium: 139 mmol/L (ref 135–145)
Sodium: 139 mmol/L (ref 135–145)
Sodium: 139 mmol/L (ref 135–145)
Sodium: 141 mmol/L (ref 135–145)
Sodium: 142 mmol/L (ref 135–145)
Sodium: 142 mmol/L (ref 135–145)
Sodium: 143 mmol/L (ref 135–145)
Sodium: 145 mmol/L (ref 135–145)
Sodium: 146 mmol/L — ABNORMAL HIGH (ref 135–145)
Sodium: 146 mmol/L — ABNORMAL HIGH (ref 135–145)
TCO2: 24 mmol/L (ref 22–32)
TCO2: 25 mmol/L (ref 22–32)
TCO2: 25 mmol/L (ref 22–32)
TCO2: 26 mmol/L (ref 22–32)
TCO2: 26 mmol/L (ref 22–32)
TCO2: 26 mmol/L (ref 22–32)
TCO2: 26 mmol/L (ref 22–32)
TCO2: 26 mmol/L (ref 22–32)
TCO2: 26 mmol/L (ref 22–32)
TCO2: 26 mmol/L (ref 22–32)
TCO2: 27 mmol/L (ref 22–32)
TCO2: 27 mmol/L (ref 22–32)
TCO2: 27 mmol/L (ref 22–32)
TCO2: 28 mmol/L (ref 22–32)
TCO2: 28 mmol/L (ref 22–32)
TCO2: 33 mmol/L — ABNORMAL HIGH (ref 22–32)

## 2019-09-27 LAB — BASIC METABOLIC PANEL
Anion gap: 8 (ref 5–15)
Anion gap: 13 (ref 5–15)
BUN: 17 mg/dL (ref 6–20)
BUN: 22 mg/dL — ABNORMAL HIGH (ref 6–20)
CO2: 20 mmol/L — ABNORMAL LOW (ref 22–32)
CO2: 20 mmol/L — ABNORMAL LOW (ref 22–32)
Calcium: 7.6 mg/dL — ABNORMAL LOW (ref 8.9–10.3)
Calcium: 8.1 mg/dL — ABNORMAL LOW (ref 8.9–10.3)
Chloride: 114 mmol/L — ABNORMAL HIGH (ref 98–111)
Chloride: 109 mmol/L (ref 98–111)
Creatinine, Ser: 1.88 mg/dL — ABNORMAL HIGH (ref 0.61–1.24)
Creatinine, Ser: 2.6 mg/dL — ABNORMAL HIGH (ref 0.61–1.24)
GFR calc Af Amer: 46 mL/min — ABNORMAL LOW (ref >60)
GFR calc Af Amer: 31 mL/min — ABNORMAL LOW (ref >60)
GFR calc non Af Amer: 40 mL/min — ABNORMAL LOW (ref >60)
GFR calc non Af Amer: 27 mL/min — ABNORMAL LOW (ref >60)
Glucose, Bld: 148 mg/dL — ABNORMAL HIGH (ref 70–99)
Glucose, Bld: 163 mg/dL — ABNORMAL HIGH (ref 70–99)
Potassium: 4.4 mmol/L (ref 3.5–5.1)
Potassium: 4.8 mmol/L (ref 3.5–5.1)
Sodium: 142 mmol/L (ref 135–145)
Sodium: 142 mmol/L (ref 135–145)

## 2019-09-27 LAB — MULTIPLE MYELOMA PANEL, SERUM
Albumin SerPl Elph-Mcnc: 2.9 g/dL (ref 2.9–4.4)
Albumin/Glob SerPl: 0.8 (ref 0.7–1.7)
Alpha 1: 0.3 g/dL (ref 0.0–0.4)
Alpha2 Glob SerPl Elph-Mcnc: 0.5 g/dL (ref 0.4–1.0)
B-Globulin SerPl Elph-Mcnc: 0.9 g/dL (ref 0.7–1.3)
Gamma Glob SerPl Elph-Mcnc: 2.1 g/dL — ABNORMAL HIGH (ref 0.4–1.8)
Globulin, Total: 3.9 g/dL (ref 2.2–3.9)
IgA: 213 mg/dL (ref 90–386)
IgG (Immunoglobin G), Serum: 2450 mg/dL — ABNORMAL HIGH (ref 603–1613)
IgM (Immunoglobulin M), Srm: 99 mg/dL (ref 20–172)
M Protein SerPl Elph-Mcnc: 1.4 g/dL — ABNORMAL HIGH
Total Protein ELP: 6.8 g/dL (ref 6.0–8.5)

## 2019-09-27 LAB — GLUCOSE, CAPILLARY
Glucose-Capillary: 117 mg/dL — ABNORMAL HIGH (ref 70–99)
Glucose-Capillary: 124 mg/dL — ABNORMAL HIGH (ref 70–99)
Glucose-Capillary: 126 mg/dL — ABNORMAL HIGH (ref 70–99)
Glucose-Capillary: 121 mg/dL — ABNORMAL HIGH (ref 70–99)
Glucose-Capillary: 92 mg/dL (ref 70–99)
Glucose-Capillary: 145 mg/dL — ABNORMAL HIGH (ref 70–99)
Glucose-Capillary: 139 mg/dL — ABNORMAL HIGH (ref 70–99)
Glucose-Capillary: 144 mg/dL — ABNORMAL HIGH (ref 70–99)
Glucose-Capillary: 135 mg/dL — ABNORMAL HIGH (ref 70–99)
Glucose-Capillary: 131 mg/dL — ABNORMAL HIGH (ref 70–99)
Glucose-Capillary: 141 mg/dL — ABNORMAL HIGH (ref 70–99)
Glucose-Capillary: 141 mg/dL — ABNORMAL HIGH (ref 70–99)
Glucose-Capillary: 146 mg/dL — ABNORMAL HIGH (ref 70–99)
Glucose-Capillary: 146 mg/dL — ABNORMAL HIGH (ref 70–99)
Glucose-Capillary: 139 mg/dL — ABNORMAL HIGH (ref 70–99)
Glucose-Capillary: 130 mg/dL — ABNORMAL HIGH (ref 70–99)
Glucose-Capillary: 158 mg/dL — ABNORMAL HIGH (ref 70–99)
Glucose-Capillary: 153 mg/dL — ABNORMAL HIGH (ref 70–99)
Glucose-Capillary: 145 mg/dL — ABNORMAL HIGH (ref 70–99)
Glucose-Capillary: 123 mg/dL — ABNORMAL HIGH (ref 70–99)
Glucose-Capillary: 139 mg/dL — ABNORMAL HIGH (ref 70–99)

## 2019-09-27 LAB — CBC
HCT: 22.3 % — ABNORMAL LOW (ref 39.0–52.0)
HCT: 22.9 % — ABNORMAL LOW (ref 39.0–52.0)
HCT: 26.6 % — ABNORMAL LOW (ref 39.0–52.0)
Hemoglobin: 7.6 g/dL — ABNORMAL LOW (ref 13.0–17.0)
Hemoglobin: 7.9 g/dL — ABNORMAL LOW (ref 13.0–17.0)
Hemoglobin: 9.1 g/dL — ABNORMAL LOW (ref 13.0–17.0)
MCH: 28.8 pg (ref 26.0–34.0)
MCH: 29.6 pg (ref 26.0–34.0)
MCH: 28.6 pg (ref 26.0–34.0)
MCHC: 34.1 g/dL (ref 30.0–36.0)
MCHC: 34.5 g/dL (ref 30.0–36.0)
MCHC: 34.2 g/dL (ref 30.0–36.0)
MCV: 84.5 fL (ref 80.0–100.0)
MCV: 85.8 fL (ref 80.0–100.0)
MCV: 83.6 fL (ref 80.0–100.0)
Platelets: 92 10*3/uL — ABNORMAL LOW (ref 150–400)
Platelets: 75 10*3/uL — ABNORMAL LOW (ref 150–400)
Platelets: 83 10*3/uL — ABNORMAL LOW (ref 150–400)
RBC: 2.64 MIL/uL — ABNORMAL LOW (ref 4.22–5.81)
RBC: 2.67 MIL/uL — ABNORMAL LOW (ref 4.22–5.81)
RBC: 3.18 MIL/uL — ABNORMAL LOW (ref 4.22–5.81)
RDW: 15.8 % — ABNORMAL HIGH (ref 11.5–15.5)
RDW: 15.7 % — ABNORMAL HIGH (ref 11.5–15.5)
RDW: 15.9 % — ABNORMAL HIGH (ref 11.5–15.5)
WBC: 7.3 10*3/uL (ref 4.0–10.5)
WBC: 9.1 10*3/uL (ref 4.0–10.5)
WBC: 14.4 10*3/uL — ABNORMAL HIGH (ref 4.0–10.5)
nRBC: 0 % (ref 0.0–0.2)
nRBC: 0 % (ref 0.0–0.2)
nRBC: 0 % (ref 0.0–0.2)

## 2019-09-27 LAB — PREPARE FRESH FROZEN PLASMA
Unit division: 0
Unit division: 0

## 2019-09-27 LAB — BPAM PLATELET PHERESIS
Blood Product Expiration Date: 202105082359
Blood Product Expiration Date: 202105082359
ISSUE DATE / TIME: 202105062048
ISSUE DATE / TIME: 202105062048
Unit Type and Rh: 5100
Unit Type and Rh: 9500

## 2019-09-27 LAB — APTT: aPTT: 54 seconds — ABNORMAL HIGH (ref 24–36)

## 2019-09-27 LAB — PREPARE PLATELET PHERESIS
Unit division: 0
Unit division: 0

## 2019-09-27 LAB — PREPARE CRYOPRECIPITATE
Unit division: 0
Unit division: 0

## 2019-09-27 LAB — BPAM CRYOPRECIPITATE
Blood Product Expiration Date: 202105070347
Blood Product Expiration Date: 202105070347
ISSUE DATE / TIME: 202105062210
ISSUE DATE / TIME: 202105062210
Unit Type and Rh: 6200
Unit Type and Rh: 6200

## 2019-09-27 LAB — PREPARE RBC (CROSSMATCH)

## 2019-09-27 LAB — SURGICAL PATHOLOGY

## 2019-09-27 LAB — PROTIME-INR
INR: 1 (ref 0.8–1.2)
Prothrombin Time: 12.4 seconds (ref 11.4–15.2)

## 2019-09-27 LAB — HEPARIN LEVEL (UNFRACTIONATED): Heparin Unfractionated: <0.1 IU/mL — ABNORMAL LOW (ref 0.30–0.70)

## 2019-09-27 LAB — MAGNESIUM
Magnesium: 2.6 mg/dL — ABNORMAL HIGH (ref 1.7–2.4)
Magnesium: 2.5 mg/dL — ABNORMAL HIGH (ref 1.7–2.4)

## 2019-09-27 IMAGING — DX DG CHEST 1V PORT
1 series · 1 of 1 positions shown · non-contrast
Comparison: Chest radiograph dated 09/25/2019.

CLINICAL DATA: 53-year-old male with chest tube placement.

EXAM:
PORTABLE CHEST 1 VIEW

[chest ap]
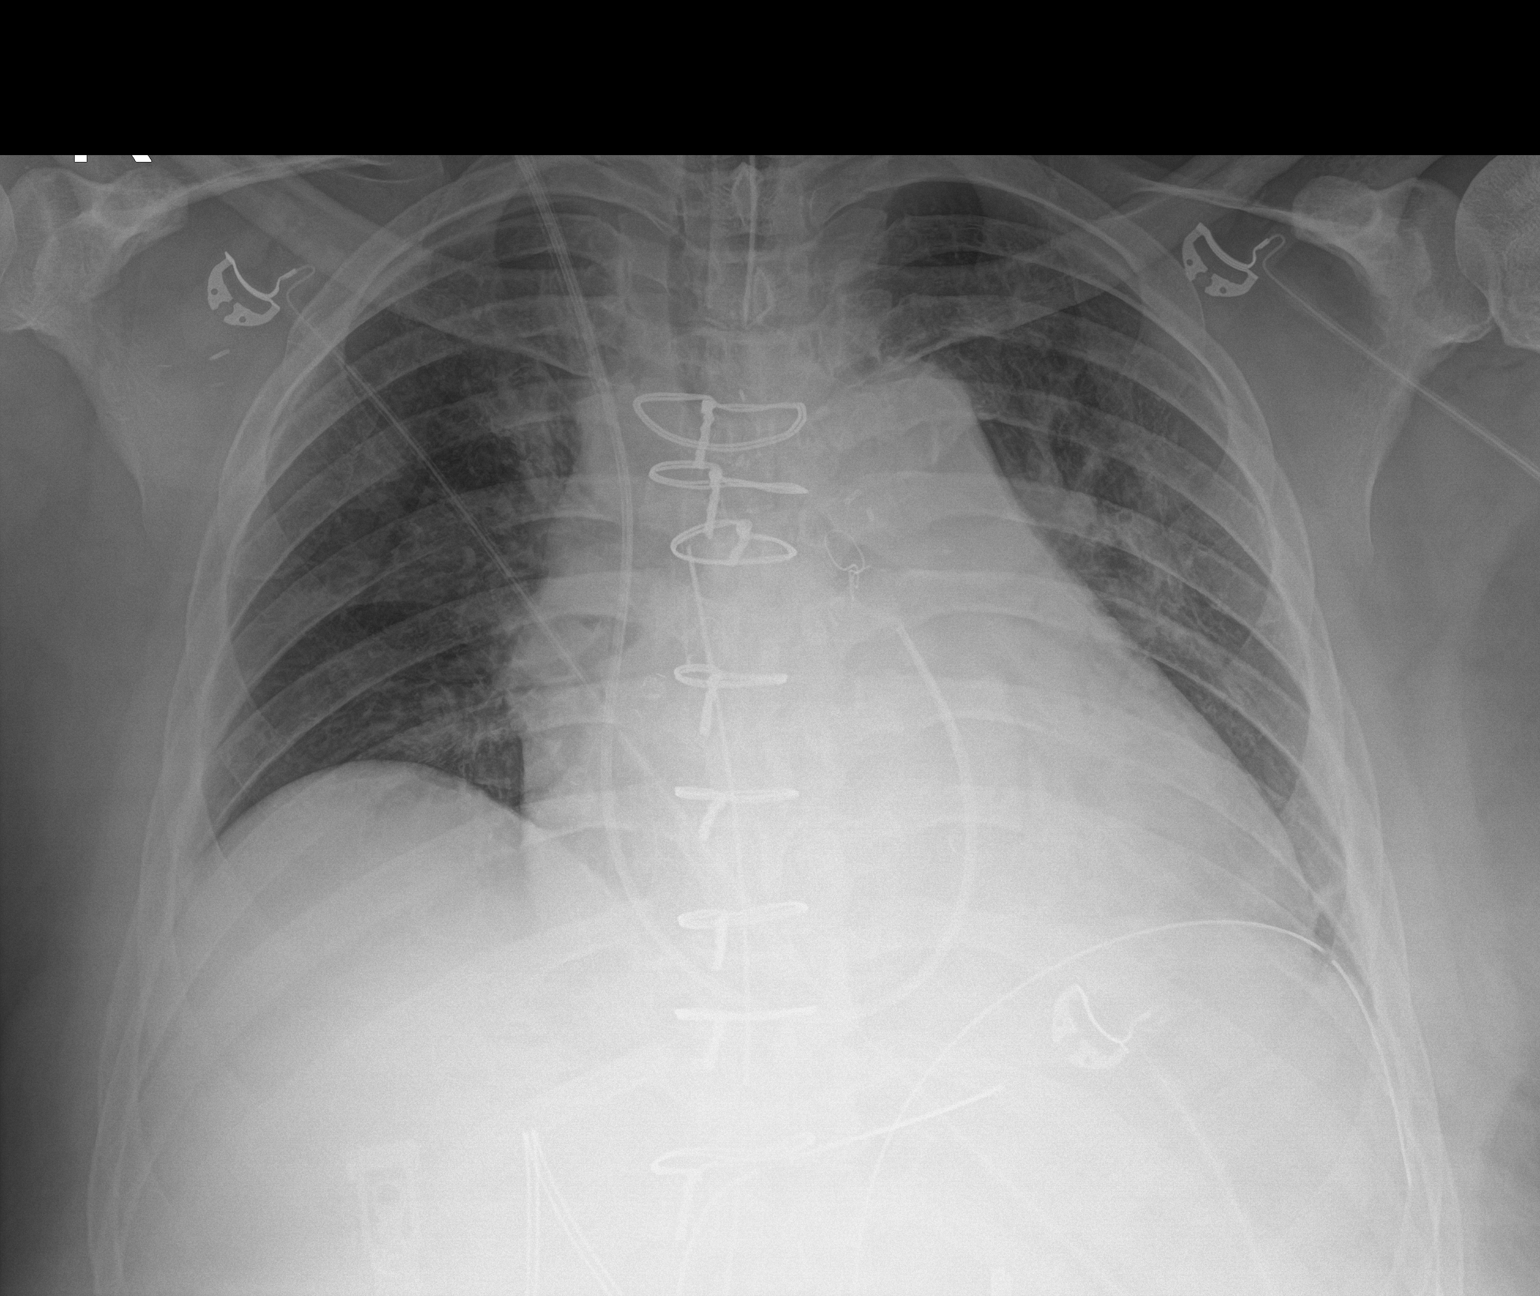

[1 of 1 positions shown; findings below may reference images not displayed]

FINDINGS: Endotracheal tube with tip approximately 5 cm above the carina.
Right IJ Swan-Ganz catheter with tip likely at the bifurcation of
the main pulmonary trunk, inferiorly accessed mediastinal drain, and
left chest tube.

Status post open heart surgery. There is cardiomegaly. Mild diffuse
hazy airspace densities may represent atelectasis or mild edema. No
focal consolidation, pleural effusion, pneumothorax.
IMPRESSION: 1. Endotracheal tube above the carina. Additional support apparatus
as above.
2. Postsurgical changes of open heart surgery.

## 2019-09-27 MED ORDER — SODIUM CHLORIDE 0.9 % IV SOLN: INTRAVENOUS | Status: DC

## 2019-09-27 MED ORDER — ALBUMIN HUMAN 5 % IV SOLN
250.0000 mL | INTRAVENOUS | Status: AC | PRN
  Administered 2019-09-27 (×3): 12.5 g via INTRAVENOUS

## 2019-09-27 MED ORDER — MIDAZOLAM HCL 2 MG/2ML IJ SOLN: 2.0000 mg | INTRAMUSCULAR | Status: DC | PRN

## 2019-09-27 MED ORDER — ASPIRIN EC 325 MG PO TBEC
325.0000 mg | DELAYED_RELEASE_TABLET | Freq: Every day | ORAL | Status: DC
  Administered 2019-09-28 – 2019-10-03 (×6): 325 mg via ORAL
  Filled 2019-09-27 (×6): qty 1

## 2019-09-27 MED ORDER — CHLORHEXIDINE GLUCONATE 0.12 % MT SOLN
15.0000 mL | OROMUCOSAL | Status: AC
  Administered 2019-09-27: 15 mL via OROMUCOSAL

## 2019-09-27 MED ORDER — DEXMEDETOMIDINE HCL IN NACL 400 MCG/100ML IV SOLN
0.0000 ug/kg/h | INTRAVENOUS | Status: DC
  Administered 2019-09-27: 0.5 ug/kg/h via INTRAVENOUS
  Administered 2019-09-27: 01:00:00 0.7 ug/kg/h via INTRAVENOUS
  Filled 2019-09-27: qty 100

## 2019-09-27 MED ORDER — METOPROLOL TARTRATE 12.5 MG HALF TABLET: 12.5000 mg | ORAL_TABLET | Freq: Two times a day (BID) | ORAL | Status: DC

## 2019-09-27 MED ORDER — ASPIRIN 81 MG PO CHEW
324.0000 mg | CHEWABLE_TABLET | Freq: Every day | ORAL | Status: DC
  Filled 2019-09-27: qty 4

## 2019-09-27 MED ORDER — PANTOPRAZOLE SODIUM 40 MG PO TBEC
40.0000 mg | DELAYED_RELEASE_TABLET | Freq: Every day | ORAL | Status: DC
  Administered 2019-09-28 – 2019-10-07 (×10): 40 mg via ORAL
  Filled 2019-09-27 (×10): qty 1

## 2019-09-27 MED ORDER — NOREPINEPHRINE 4 MG/250ML-% IV SOLN
0.0000 ug/min | INTRAVENOUS | Status: DC
  Administered 2019-09-27: 2 ug/min via INTRAVENOUS
  Administered 2019-09-27: 18:00:00 5 ug/min via INTRAVENOUS
  Administered 2019-09-28: 3 ug/min via INTRAVENOUS
  Administered 2019-09-28: 6 ug/min via INTRAVENOUS
  Administered 2019-09-30: 3 ug/min via INTRAVENOUS
  Filled 2019-09-27 (×4): qty 250

## 2019-09-27 MED ORDER — DEXMEDETOMIDINE HCL IN NACL 400 MCG/100ML IV SOLN
0.1000 ug/kg/h | INTRAVENOUS | Status: DC
  Filled 2019-09-27: qty 100

## 2019-09-27 MED ORDER — LACTATED RINGERS IV SOLN: INTRAVENOUS | Status: DC

## 2019-09-27 MED ORDER — CHLORHEXIDINE GLUCONATE CLOTH 2 % EX PADS
6.0000 | MEDICATED_PAD | Freq: Every day | CUTANEOUS | Status: DC
  Administered 2019-09-27 – 2019-10-07 (×9): 6 via TOPICAL

## 2019-09-27 MED ORDER — MAGNESIUM SULFATE 4 GM/100ML IV SOLN
4.0000 g | Freq: Once | INTRAVENOUS | Status: AC
  Administered 2019-09-27: 03:00:00 4 g via INTRAVENOUS
  Filled 2019-09-27: qty 100

## 2019-09-27 MED ORDER — NITROGLYCERIN IN D5W 200-5 MCG/ML-% IV SOLN: 0.0000 ug/min | INTRAVENOUS | Status: DC

## 2019-09-27 MED ORDER — SODIUM CHLORIDE 0.9% FLUSH
3.0000 mL | Freq: Two times a day (BID) | INTRAVENOUS | Status: DC
  Administered 2019-09-27 – 2019-10-04 (×14): 3 mL via INTRAVENOUS

## 2019-09-27 MED ORDER — SODIUM BICARBONATE 8.4 % IV SOLN
100.0000 meq | Freq: Once | INTRAVENOUS | Status: AC
  Administered 2019-09-27: 100 meq via INTRAVENOUS

## 2019-09-27 MED ORDER — LACTATED RINGERS IV SOLN: 500.0000 mL | Freq: Once | INTRAVENOUS | Status: DC | PRN

## 2019-09-27 MED ORDER — SODIUM CHLORIDE 0.9% IV SOLUTION: Freq: Once | INTRAVENOUS | Status: AC

## 2019-09-27 MED ORDER — POTASSIUM CHLORIDE 10 MEQ/50ML IV SOLN: 10.0000 meq | INTRAVENOUS | Status: AC

## 2019-09-27 MED ORDER — TRAMADOL HCL 50 MG PO TABS: 50.0000 mg | ORAL_TABLET | ORAL | Status: DC | PRN

## 2019-09-27 MED ORDER — SODIUM CHLORIDE 0.9% FLUSH: 3.0000 mL | INTRAVENOUS | Status: DC | PRN

## 2019-09-27 MED ORDER — FAMOTIDINE IN NACL 20-0.9 MG/50ML-% IV SOLN
20.0000 mg | Freq: Two times a day (BID) | INTRAVENOUS | Status: AC
  Administered 2019-09-27 (×2): 20 mg via INTRAVENOUS
  Filled 2019-09-27 (×2): qty 50

## 2019-09-27 MED ORDER — ACETAMINOPHEN 650 MG RE SUPP
650.0000 mg | Freq: Once | RECTAL | Status: AC
  Administered 2019-09-27: 650 mg via RECTAL

## 2019-09-27 MED ORDER — PHENYLEPHRINE HCL-NACL 20-0.9 MG/250ML-% IV SOLN: 0.0000 ug/min | INTRAVENOUS | Status: DC

## 2019-09-27 MED ORDER — ONDANSETRON HCL 4 MG/2ML IJ SOLN
4.0000 mg | Freq: Four times a day (QID) | INTRAMUSCULAR | Status: DC | PRN
  Administered 2019-09-27 – 2019-10-04 (×3): 4 mg via INTRAVENOUS
  Filled 2019-09-27 (×3): qty 2

## 2019-09-27 MED ORDER — SODIUM CHLORIDE 0.45 % IV SOLN: INTRAVENOUS | Status: DC | PRN

## 2019-09-27 MED ORDER — BISACODYL 5 MG PO TBEC
10.0000 mg | DELAYED_RELEASE_TABLET | Freq: Every day | ORAL | Status: DC
  Administered 2019-09-28 – 2019-10-02 (×5): 10 mg via ORAL
  Filled 2019-09-27 (×5): qty 2

## 2019-09-27 MED ORDER — COAGULATION FACTOR VIIA RECOMB 1 MG IV SOLR
90.0000 ug/kg | Freq: Once | INTRAVENOUS | Status: AC
  Administered 2019-09-27: 12 mg via INTRAVENOUS
  Filled 2019-09-27: qty 1

## 2019-09-27 MED ORDER — MORPHINE SULFATE (PF) 2 MG/ML IV SOLN
1.0000 mg | INTRAVENOUS | Status: DC | PRN
  Administered 2019-09-27 (×3): 4 mg via INTRAVENOUS
  Administered 2019-09-28: 2 mg via INTRAVENOUS
  Administered 2019-09-28: 4 mg via INTRAVENOUS
  Administered 2019-09-29 (×2): 2 mg via INTRAVENOUS
  Filled 2019-09-27 (×2): qty 2
  Filled 2019-09-27: qty 1
  Filled 2019-09-27: qty 2
  Filled 2019-09-27 (×2): qty 1
  Filled 2019-09-27: qty 2

## 2019-09-27 MED ORDER — ACETAMINOPHEN 160 MG/5ML PO SOLN: 650.0000 mg | Freq: Once | ORAL | Status: AC

## 2019-09-27 MED ORDER — DOCUSATE SODIUM 100 MG PO CAPS
200.0000 mg | ORAL_CAPSULE | Freq: Every day | ORAL | Status: DC
  Administered 2019-09-28 – 2019-10-07 (×7): 200 mg via ORAL
  Filled 2019-09-27 (×8): qty 2

## 2019-09-27 MED ORDER — BISACODYL 10 MG RE SUPP: 10.0000 mg | Freq: Every day | RECTAL | Status: DC

## 2019-09-27 MED ORDER — INSULIN REGULAR(HUMAN) IN NACL 100-0.9 UT/100ML-% IV SOLN
INTRAVENOUS | Status: DC
  Administered 2019-09-27: 2.8 [IU]/h via INTRAVENOUS
  Administered 2019-09-27: 2.4 [IU]/h via INTRAVENOUS
  Administered 2019-09-28: 19:00:00 3 [IU]/h via INTRAVENOUS
  Filled 2019-09-27 (×2): qty 100

## 2019-09-27 MED ORDER — ACETAMINOPHEN 160 MG/5ML PO SOLN: 1000.0000 mg | Freq: Four times a day (QID) | ORAL | Status: AC

## 2019-09-27 MED ORDER — EPINEPHRINE HCL 5 MG/250ML IV SOLN IN NS
2.0000 ug/min | INTRAVENOUS | Status: DC
  Administered 2019-09-27 – 2019-09-28 (×2): 2 ug/min via INTRAVENOUS
  Filled 2019-09-27: qty 250

## 2019-09-27 MED ORDER — OXYCODONE HCL 5 MG PO TABS
5.0000 mg | ORAL_TABLET | ORAL | Status: DC | PRN
  Administered 2019-09-27 – 2019-10-02 (×3): 10 mg via ORAL
  Filled 2019-09-27 (×3): qty 2

## 2019-09-27 MED ORDER — SODIUM CHLORIDE 0.9 % IV SOLN
250.0000 mL | INTRAVENOUS | Status: DC
  Administered 2019-09-27: 250 mL via INTRAVENOUS

## 2019-09-27 MED ORDER — DEXTROSE 50 % IV SOLN: 0.0000 mL | INTRAVENOUS | Status: DC | PRN

## 2019-09-27 MED ORDER — MAGNESIUM SULFATE 2 GM/50ML IV SOLN
INTRAVENOUS | Status: AC
  Filled 2019-09-27: qty 50

## 2019-09-27 MED ORDER — METOPROLOL TARTRATE 5 MG/5ML IV SOLN: 2.5000 mg | INTRAVENOUS | Status: DC | PRN

## 2019-09-27 MED ORDER — BOOST / RESOURCE BREEZE PO LIQD CUSTOM
1.0000 | Freq: Two times a day (BID) | ORAL | Status: DC
  Administered 2019-09-28 – 2019-10-05 (×10): 1 via ORAL

## 2019-09-27 MED ORDER — DOPAMINE-DEXTROSE 3.2-5 MG/ML-% IV SOLN
2.0000 ug/kg/min | INTRAVENOUS | Status: DC
  Administered 2019-09-27 – 2019-09-28 (×2): 2 ug/kg/min via INTRAVENOUS
  Filled 2019-09-27: qty 250

## 2019-09-27 MED ORDER — METOPROLOL TARTRATE 25 MG/10 ML ORAL SUSPENSION: 12.5000 mg | Freq: Two times a day (BID) | ORAL | Status: DC

## 2019-09-27 MED ORDER — ACETAMINOPHEN 500 MG PO TABS
1000.0000 mg | ORAL_TABLET | Freq: Four times a day (QID) | ORAL | Status: AC
  Administered 2019-09-28 – 2019-10-01 (×16): 1000 mg via ORAL
  Filled 2019-09-27 (×18): qty 2

## 2019-09-27 MED FILL — Thrombin (Recombinant) For Soln 20000 Unit: CUTANEOUS | Qty: 1 | Status: AC

## 2019-09-27 NOTE — Progress Notes (Signed)
   Surgery yesterday- tolerated well. No vegetation noted on the aortic valve, however, the was peri-annular abscess. Underwent complex surgical revision with fistula closure, replacement of ascending aortic graft and CABG x 3. Doing reasonably well today despite the complex surgery. Anemic, transfusion ordered. Paced rhythm.  Zachary Casino, MD, [facility], [place] Director of the [facility] Diplomate of the American Board of Clinical Lipidology Attending Cardiologist  Direct Dial: [phone]  Fax: [phone]  Website:  [email or web address]

## 2019-09-27 NOTE — Discharge Instructions (Signed)
Discharge Instructions:  1. You may shower, please wash incisions daily with soap and water and keep dry.  If you wish to cover wounds with dressing you may do so but please keep clean and change daily.  No tub baths or swimming until incisions have completely healed.  If your incisions become red or develop any drainage please call our office at [phone]  2. No Driving until cleared by Dr. Vivi Martens office and you are no longer using narcotic pain medications  3. Monitor your weight daily.. Please use the same scale and weigh at same time... If you gain 5-10 lbs in 48 hours with associated lower extremity swelling, please contact our office at [phone]  4. Fever of 101.5 for at least 24 hours with no source, please contact our office at [phone]  5. Activity- up as tolerated, please walk at least 3 times per day.  Avoid strenuous activity, no lifting, pushing, or pulling with your arms over 8-10 lbs for a minimum of 6 weeks  6. If any questions or concerns arise, please do not hesitate to contact our office at [phone]  Heart Failure, Diagnosis  Heart failure means that your heart is not able to pump blood in the right way. This makes it hard for your body to work well. Heart failure is usually a long-term (chronic) condition. You must take good care of yourself and follow your treatment plan from your doctor. What are the causes? This condition may be caused by:  High blood pressure.  Build up of cholesterol and fat in the arteries.  Heart attack. This injures the heart muscle.  Heart valves that do not open and close properly.  Damage of the heart muscle. This is also called cardiomyopathy.  Lung disease.  Abnormal heart rhythms. What increases the risk? The risk of heart failure goes up as a person ages. This condition is also more likely to develop in people who:  Are overweight.  Are male.  Smoke or chew tobacco.  Abuse alcohol or illegal drugs.  Have  taken medicines that can damage the heart.  Have diabetes.  Have abnormal heart rhythms.  Have thyroid problems.  Have low blood counts (anemia). What are the signs or symptoms? Symptoms of this condition include:  Shortness of breath.  Coughing.  Swelling of the feet, ankles, legs, or belly.  Losing weight for no reason.  Trouble breathing.  Waking from sleep because of the need to sit up and get more air.  Rapid heartbeat.  Being very tired.  Feeling dizzy, or feeling like you may pass out (faint).  Having no desire to eat.  Feeling like you may vomit (nauseous).  Peeing (urinating) more at night.  Feeling confused. How is this treated?     This condition may be treated with:  Medicines. These can be given to treat blood pressure and to make the heart muscles stronger.  Changes in your daily life. These may include eating a healthy diet, staying at a healthy body weight, quitting tobacco and illegal drug use, or doing exercises.  Surgery. Surgery can be done to open blocked valves, or to put devices in the heart, such as pacemakers.  A donor heart (heart transplant). You will receive a healthy heart from a donor. Follow these instructions at home:  Treat other conditions as told by your doctor. These may include high blood pressure, diabetes, thyroid disease, or abnormal heart rhythms.  Learn as much as you can about heart failure.  Get support  as you need it.  Keep all follow-up visits as told by your doctor. This is important. Summary  Heart failure means that your heart is not able to pump blood in the right way.  This condition is caused by high blood pressure, heart attack, or damage of the heart muscle.  Symptoms of this condition include shortness of breath and swelling of the feet, ankles, legs, or belly. You may also feel very tired or feel like you may vomit.  You may be treated with medicines, surgery, or changes in your daily  life.  Treat other health conditions as told by your doctor. This information is not intended to replace advice given to you by your health care provider. Make sure you discuss any questions you have with your health care provider. Document Revised: 07/27/2018 Document Reviewed: 07/27/2018 Elsevier Patient Education  [place] AFTER SURGERY  FACTS:  Ice used in ice bag helps keep the swelling down, and can help lessen the pain.  It is easier to treat pain BEFORE it happens.  Spitting disturbs the clot and may cause bleeding to start again, or to get worse.  Smoking delays healing and can cause complications.  Sharing prescriptions can be dangerous.  Do not take medications not recently prescribed for you.  Antibiotics may stop birth control pills from working.  Use other means of birth control while on antibiotics.  Warm salt water rinses after the first 24 hours will help lessen the swelling:  Use 1/2 teaspoonful of table salt per oz.of water.  DO NOT:  Do not spit.  Do not drink through a straw.  Strongly advised not to smoke, dip snuff or chew tobacco at least for 3 days.  Do not eat sharp or crunchy foods.  Avoid the area of surgery when chewing.  Do not stop your antibiotics before your instructions say to do so.  Do not eat hot foods until bleeding has stopped.  If you need to, let your food cool down to room temperature.  EXPECT:  Some swelling, especially first 2-3 days.  Soreness or discomfort in varying degrees.  Follow your dentist's instructions about how to handle pain before it starts.  Pinkish saliva or light blood in saliva, or on your pillow in the morning.  This can last around 24 hours.  Bruising inside or outside the mouth.  This may not show up until 2-3 days after surgery.  Don't worry, it will go away in time.  Pieces of "bone" may work themselves loose.  It's OK.  If they bother you, let us know.  WHAT TO DO IMMEDIATELY  AFTER SURGERY:  Bite on the gauze with steady pressure for 1-2 hours.  Don't chew on the gauze.  Do not lie down flat.  Raise your head support especially for the first 24 hours.  Apply ice to your face on the side of the surgery.  You may apply it 20 minutes on and a few minutes off.  Ice for 8-12 hours.  You may use ice up to 24 hours.  Before the numbness wears off, take a pain pill as instructed.  Prescription pain medication is not always required.  SWELLING:  Expect swelling for the first couple of days.  It should get better after that.  If swelling increases 3 days or so after surgery; let us know as soon as possible.  FEVER:  Take Tylenol every 4 hours if needed to lower your temperature, especially if it is at  100F or higher.  Drink lots of fluids.  If the fever does not go away, let us know.  BREATHING TROUBLE:  Any unusual difficulty breathing means you have to have someone bring you to the emergency room ASAP  BLEEDING:  Light oozing is expected for 24 hours or so.  Prop head up with pillows  Avoid spitting  Do not confuse bright red fresh flowing blood with lots of saliva colored with a little bit of blood.  If you notice some bleeding, place gauze or a tea bag where it is bleeding and apply CONSTANT pressure by biting down for 1 hour.  Avoid talking during this time.  Do not remove the gauze or tea bag during this hour to "check" the bleeding.  If you notice bright RED bleeding FLOWING out of particular area, and filling the floor of your mouth, put a wad of gauze on that area, bite down firmly and constantly.  Call us immediately.  If we're closed, have someone bring you to the emergency room.  ORAL HYGIENE:  Brush your teeth as usual after meals and before bedtime.  Use a soft toothbrush around the area of surgery.  DO NOT AVOID BRUSHING.  Otherwise bacteria(germs) will grow and may delay healing or encourage infection.  Since you cannot spit, just  gently rinse and let the water flow out of your mouth.  DO NOT SWISH HARD.  EATING:  Cool liquids are a good point to start.  Increase to soft foods as tolerated.  PRESCRIPTIONS:  Follow the directions for your prescriptions exactly as written.  If Dr. Enrique Sack gave you a narcotic pain medication, do not drive, operate machinery or drink alcohol when on that medication.  QUESTIONS:  Call our office during office hours [phone] or call the Emergency Room at [phone].
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
## 2019-09-27 NOTE — Discharge Summary (Addendum)
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 appreciable pneumothorax. Airspace consolidation persists in the left lower lobe. Lungs elsewhere clear. Heart is enlarged, stable, with pulmonary vascularity normal. No adenopathy. No appreciable bone lesions. IMPRESSION: Tube and catheter positions unchanged without pneumothorax. Persistent airspace opacity left lower lobe. No new opacity evident. Stable cardiomegaly. Electronically Signed   By: Lowella Grip III M.D.   On: 09/29/2019 07:57   DG CHEST PORT 1 VIEW  Result Date: 09/28/2019 CLINICAL DATA:  Postoperative median sternotomy with aortic root replacement EXAM: PORTABLE CHEST 1 VIEW COMPARISON:  Sep 27, 2019 FINDINGS: Endotracheal tube has been removed. Swan-Ganz catheter tip is in the main pulmonary outflow tract. There is a left chest tube and a mediastinal drain. No pneumothorax. There is persistent airspace consolidation in the left lower lobe. No new opacity evident. Heart is mildly enlarged with pulmonary vascularity, stable. No adenopathy evident. No focal bone lesions are appreciable. IMPRESSION: Endotracheal tube removed. Other tube and catheter positions as described. No pneumothorax. Persistent cardiac prominence. Persistent left lower lobe airspace consolidation. No new opacity.  Electronically Signed   By: Lowella Grip III M.D.   On: 09/28/2019 08:34   DG CHEST PORT 1 VIEW  Result Date: 09/27/2019 CLINICAL DATA:  54 year old male with chest tube placement. EXAM: PORTABLE CHEST 1 VIEW COMPARISON:  Chest radiograph dated 09/25/2019. FINDINGS: Endotracheal tube with tip approximately 5 cm above the carina. Right IJ Swan-Ganz catheter with tip likely at the bifurcation of the main pulmonary trunk, inferiorly accessed mediastinal drain, and left chest tube. Status post open heart surgery. There is cardiomegaly. Mild diffuse hazy airspace densities may represent atelectasis or mild edema. No focal consolidation, pleural effusion, pneumothorax. IMPRESSION: 1. Endotracheal tube above the carina. Additional support apparatus as above. 2. Postsurgical changes of open heart surgery. Electronically Signed   By: Laren Everts.D.  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 EXAM: METASTATIC BONE SURVEY COMPARISON:  None. FINDINGS: Skull: Normal bony mineralization. No focal lytic or blastic osseous lesion. Cervical Spine: Normal bony mineralization. No focal lytic or blastic osseous lesion. Thoracic Spine: Normal bony mineralization. No focal lytic or blastic osseous lesion. T10 vertebral body lesion is better demonstrated on recent chest CT. Chest: Normal bony mineralization. No focal lytic or blastic osseous lesion. Lumbar Spine: Normal bony mineralization. No focal lytic or blastic osseous lesion. 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 Pelvis: Normal bony mineralization. No focal lytic or blastic osseous lesion. Right Upper Extremity: Small lytic appearing lesions within the proximal and mid RIGHT humerus, each measuring approximately 5-6 mm greatest dimension. Left Upper Extremity: Normal bony mineralization. No focal lytic or  blastic osseous lesion. Right Lower Extremity: Normal bony mineralization. No focal lytic or blastic osseous lesion. Left Lower Extremity: Normal bony mineralization. No focal lytic or blastic osseous lesion. IMPRESSION: 1. Two small lytic appearing lesions within the proximal and mid RIGHT humerus, measuring approximately 5-6 mm greatest dimension, suspicious for foci of metastatic disease/multiple myeloma. 2. Aggressive appearing lytic lesion within the T10 vertebral body is better demonstrated on recent chest CT. 3. Otherwise normal bone survey. No additional evidence of metastatic disease. 4. 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 Electronically Signed   By: Franki Cabot M.D.   On: 09/25/2019 09:32   DG Foot Complete Left  Result Date: 09/15/2019 CLINICAL DATA:  Gout. EXAM: LEFT FOOT - COMPLETE 3+ VIEW COMPARISON:  None. FINDINGS: There is no evidence of fracture or dislocation. Very mild degenerative changes are seen involving the metatarsophalangeal joint of the left great toe. Soft tissues are unremarkable. IMPRESSION: Very mild degenerative changes without evidence of acute osseous abnormality. Electronically Signed   By: Virgina Norfolk M.D.   On: 09/15/2019 21:30   CT BONE MARROW BIOPSY & ASPIRATION  Result Date: 09/25/2019 CLINICAL DATA:  Plasma cell neoplasm EXAM: CT GUIDED DEEP ILIAC BONE ASPIRATION AND CORE BIOPSY TECHNIQUE: Patient was placed prone on the CT gantry and limited axial scans through the pelvis were obtained. Appropriate skin entry site was identified. Skin site was marked, prepped with chlorhexidine, draped in usual sterile fashion, and infiltrated locally with 1% lidocaine. Intravenous Fentanyl 181mg and Versed 230mwere administered as conscious sedation during continuous monitoring of the patient's level of consciousness and physiological / cardiorespiratory status by the radiology RN,  with a total moderate sedation time of 10 minutes. Under CT fluoroscopic guidance an 11-gauge Cook trocar bone needle was advanced into the left iliac bone just lateral to the sacroiliac joint. Once needle tip position was confirmed, core and aspiration samples were obtained, submitted to pathology for approval. Post procedure scans show no hematoma or fracture. Patient tolerated procedure well. COMPLICATIONS: COMPLICATIONS none IMPRESSION: 1. Technically successful CT guided left iliac bone core and aspiration biopsy. Electronically Signed   By: D Lucrezia Europe.D.   On: 09/25/2019 12:24   CT ANGIO CHEST AORTA W/CM & OR WO/CM  Result Date: 09/12/2019 CLINICAL DATA:  [age]ear old male under preoperative evaluation prior to potential redo Bentall procedure. EXAM: CT ANGIOGRAPHY CHEST, ABDOMEN AND PELVIS TECHNIQUE: Non-contrast CT of the chest was initially obtained. Multidetector CT imaging through the chest, abdomen and pelvis was performed using the standard protocol during  bolus administration of intravenous contrast. Multiplanar reconstructed images and MIPs were obtained and reviewed to evaluate the vascular anatomy. CONTRAST:  171m OMNIPAQUE IOHEXOL 350 MG/ML SOLN COMPARISON:  Chest CTA 10/09/2014. CTA of the chest, abdomen and pelvis 07/24/2013. FINDINGS: CTA CHEST FINDINGS Cardiovascular: Heart size is enlarged. There is no significant pericardial fluid, thickening or pericardial calcification. 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 There is a small defect on the undersurface of the mechanical aortic valve best appreciated on coronal image 82 of series 8 measuring approximately 8 mm, which may represent valve associated thrombus and/or vegetation. The aortic arch is mildly aneurysmal measuring up to 4.3 cm in diameter. There is a residual dissection flap in the proximal and mid aortic arch which does not propagate into the great  vessels. Descending thoracic aorta is normal in caliber measuring 2.8 cm in diameter. Bovine type thoracic aortic arch incidentally noted. There is a right coronary artery bypass graft. Large filling defect in the superior aspect of the right atrium immediately posterior to the septal leaflet of the tricuspid valve, and intimately associated with the adjacent aortic root, estimated to measure approximately 2.2 x 2.2 x 1.7 cm (axial image 83 of series 6 and coronal image 73 of series 8), presumably a vegetation. Mediastinum/Nodes: No pathologically enlarged mediastinal or hilar lymph nodes. Esophagus is unremarkable in appearance. No axillary lymphadenopathy. Lungs/Pleura: No suspicious pulmonary nodules or masses are noted. No acute consolidative airspace disease. No pleural effusions. Musculoskeletal: In the T10 vertebral body there is a large lucent lesion with extension of soft tissue into the right paravertebral region and involvement of the head of the right tenth rib overall estimated to measure approximately 4.9 x 3.4 x 2.0 cm (axial image 105 of series 6 and sagittal image 117 of series 9). This causes significant narrowing of the central spinal canal (approximately 5 mm AP) at this level. Median sternotomy wires. Review of the MIP images confirms the above findings. CTA ABDOMEN AND PELVIS FINDINGS VASCULAR Aorta: Mild aortic atherosclerosis. Normal caliber aorta without aneurysm, dissection, vasculitis or significant stenosis. Celiac: Patent without evidence of aneurysm, dissection, vasculitis or significant stenosis. SMA: Patent without evidence of aneurysm, dissection, vasculitis or significant stenosis. Renals: Both renal arteries are patent without evidence of aneurysm, dissection, vasculitis, fibromuscular dysplasia or significant stenosis. IMA: Patent without evidence of aneurysm, dissection, vasculitis or significant stenosis. Inflow: Patent without evidence of aneurysm, dissection, vasculitis or  significant stenosis. Veins: No obvious venous abnormality within the limitations of this arterial phase study. Review of the MIP images confirms the above findings. NON-VASCULAR Hepatobiliary: No suspicious appearing cystic or solid hepatic lesions are confidently identified on today's arterial phase examination. No intra or extrahepatic biliary ductal dilatation. Several partially calcified gallstones are noted measuring up to 1.6 cm in diameter. Gallbladder is otherwise unremarkable in appearance. Pancreas: No pancreatic mass. No pancreatic ductal dilatation. No pancreatic or peripancreatic fluid collections or inflammatory changes. Spleen: Unremarkable. Adrenals/Urinary Tract: Bilateral kidneys and adrenal glands are normal in appearance. No hydroureteronephrosis. Urinary bladder is normal in appearance. Stomach/Bowel: Normal appearance of the stomach. No pathologic dilatation of small bowel or colon. The appendix is not confidently identified and may be surgically absent. Regardless, there are no inflammatory changes noted adjacent to the cecum to suggest the presence of an acute appendicitis at this time. Lymphatic: No lymphadenopathy noted in the abdomen or pelvis. Reproductive: Prostate gland and seminal vesicles are unremarkable in appearance. Other: No significant volume  of ascites.  No pneumoperitoneum. Musculoskeletal: There are no aggressive appearing lytic or blastic lesions noted in the visualized portions of the skeleton. Review of the MIP images confirms the above findings. IMPRESSION: 1. 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 2. Small lesions associated with the undersurface of the mechanical aortic valve and the superior aspect of the septal leaflet of the tricuspid valve, which may reflect valve associated thrombus and/or  vegetations, as detailed above. 3. Aggressive appearing lesion in T10 vertebral body with extends in into the right paravertebral soft tissues and involvement of the head of the right tenth rib, with marked narrowing of the central spinal canal at this level, as detailed above. Primary differential considerations include a plasmacytoma, or less likely and osteoblastoma or solitary metastatic lesion (no primary lesion is confidently identified). If there is clinical concern for spinal cord compression, the full extent of this lesion could be better evaluated with thoracic spine MRI with and without IV gadolinium. 4. Bovine type thoracic aortic arch (normal anatomical variant) incidentally noted. 5. Cholelithiasis without evidence of acute cholecystitis at this time. 6. Additional incidental findings, as above. These results will be called to the ordering clinician or representative by the Radiologist Assistant, and communication documented in the PACS or [employer]. Electronically Signed   By: Vinnie Langton M.D.   On: 09/12/2019 13:02   ECHOCARDIOGRAM COMPLETE  Result Date: 09/09/2019    ECHOCARDIOGRAM REPORT   Patient Name:   [name] Date of Exam: 09/09/2019 Medical Rec #:  [id number]        Height:       74.0 in Accession #:    [id number]       Weight:       282.2 lb Date of Birth:  [birth year]        BSA:          2.516 m Patient Age:    60 years         BP:           120/74 mmHg Patient Gender: M                HR:           77 bpm. Exam Location:  Inpatient Procedure: 2D Echo, Cardiac Doppler and Color Doppler Indications:    Dyspnea 786.09  History:        Patient has prior history of Echocardiogram examinations, most                 recent 10/24/2017. CAD, Prior CABG, Signs/Symptoms:Shortness of                 Breath and Chest Pain; Risk Factors:Hypertension, Diabetes,                 Dyslipidemia and Non-Smoker. Bentall.                 Aortic Valve: mechanical valve is present in the aortic                  position. Procedure Date: 2000.  Sonographer:    Vickie Epley RDCS Referring Phys: [id number] [place]  1. Tricuspid valve vegetation. Discussed with referring provider.  2. Left ventricular ejection fraction, by estimation, is 55 to 60%. The left ventricle has normal function. The left ventricle has no regional wall motion  abnormalities. Left ventricular diastolic parameters were normal.  3. Right ventricular systolic function is normal. The right ventricular size is mildly enlarged. There is normal pulmonary artery systolic pressure.  4. Left atrial size was moderately dilated.  5. Right atrial size was severely dilated.  6. The mitral valve is normal in structure. Mild mitral valve regurgitation. No evidence of mitral stenosis.  7. There is a 1.8 x 1.1 cm tricuspid valve mass on the atrial surface consistent with vegetation. . Tricuspid valve regurgitation is moderate.  8. Transaortic velocity is higher than expected for mechanical aortic valve. The aortic valve is normal in structure. Aortic valve regurgitation is not visualized. No aortic stenosis is present. There is a mechanical valve present in the aortic position. Procedure Date: 2000. Aortic valve mean gradient measures 33.5 mmHg. Aortic valve Vmax measures 3.68 m/s.  9. Aortic root/ascending aorta has been repaired/replaced and Bentall. 10. The inferior vena cava is normal in size with greater than 50% respiratory variability, suggesting right atrial pressure of 3 mmHg. FINDINGS  Left Ventricle: Left ventricular ejection fraction, by estimation, is 55 to 60%. The left ventricle has normal function. The left ventricle has no regional wall motion abnormalities. The left ventricular internal cavity size was normal in size. There is  no left ventricular hypertrophy. Left ventricular diastolic parameters were normal. Right Ventricle: The right ventricular size is mildly enlarged. No increase in right ventricular wall thickness.  Right ventricular systolic function is normal. There is normal pulmonary artery systolic pressure. The tricuspid regurgitant velocity is 2.51  m/s, and with an assumed right atrial pressure of 3 mmHg, the estimated right ventricular systolic pressure is 74.2 mmHg. Left Atrium: Left atrial size was moderately dilated. Right Atrium: Right atrial size was severely dilated. Pericardium: There is no evidence of pericardial effusion. Mitral Valve: The mitral valve is normal in structure. Normal mobility of the mitral valve leaflets. Mild mitral annular calcification. Mild mitral valve regurgitation. No evidence of mitral valve stenosis. Tricuspid Valve: There is a 1.8 x 1.1 cm tricuspid valve mass on the atrial surface consistent with vegetation. The tricuspid valve is normal in structure. Tricuspid valve regurgitation is moderate . No evidence of tricuspid stenosis. Aortic Valve: Transaortic velocity is higher than expected for mechanical aortic valve. The aortic valve is normal in structure. Aortic valve regurgitation is not visualized. No aortic stenosis is present. Aortic valve mean gradient measures 33.5 mmHg. Aortic valve peak gradient measures 54.3 mmHg. Aortic valve area, by VTI measures 0.93 cm. There is a mechanical valve present in the aortic position. Procedure Date: 2000. Pulmonic Valve: The pulmonic valve was normal in structure. Pulmonic valve regurgitation is mild. No evidence of pulmonic stenosis. Aorta: The aortic root/ascending aorta has been repaired/replaced and Bentall. Venous: The inferior vena cava is normal in size with greater than 50% respiratory variability, suggesting right atrial pressure of 3 mmHg. IAS/Shunts: No atrial level shunt detected by color flow Doppler.  LEFT VENTRICLE PLAX 2D LVIDd:         5.60 cm      Diastology LVIDs:         4.60 cm      LV e' lateral:   13.10 cm/s LV PW:         1.10 cm      LV E/e' lateral: 5.7 LV IVS:        1.10 cm      LV e' medial:    8.49 cm/s LVOT  diam:     2.20  cm      LV E/e' medial:  8.8 LV SV:         73 LV SV Index:   29 LVOT Area:     3.80 cm  LV Volumes (MOD) LV vol d, MOD A2C: 163.0 ml LV vol d, MOD A4C: 157.0 ml LV vol s, MOD A2C: 88.8 ml LV vol s, MOD A4C: 72.8 ml LV SV MOD A2C:     74.2 ml LV SV MOD A4C:     157.0 ml LV SV MOD BP:      77.9 ml RIGHT VENTRICLE RV S prime:     9.25 cm/s TAPSE (M-mode): 1.8 cm LEFT ATRIUM             Index       RIGHT ATRIUM           Index LA diam:        5.80 cm 2.31 cm/m  RA Area:     37.80 cm LA Vol (A2C):   65.8 ml 26.15 ml/m RA Volume:   141.00 ml 56.04 ml/m LA Vol (A4C):   76.5 ml 30.41 ml/m LA Biplane Vol: 77.8 ml 30.92 ml/m  AORTIC VALVE AV Area (Vmax):    0.95 cm AV Area (Vmean):   0.88 cm AV Area (VTI):     0.93 cm AV Vmax:           368.50 cm/s AV Vmean:          273.000 cm/s AV VTI:            0.786 m AV Peak Grad:      54.3 mmHg AV Mean Grad:      33.5 mmHg LVOT Vmax:         92.00 cm/s LVOT Vmean:        63.300 cm/s LVOT VTI:          0.193 m LVOT/AV VTI ratio: 0.25  AORTA Ao Root diam: 2.90 cm MITRAL VALVE               TRICUSPID VALVE MV Area (PHT): 5.02 cm    TR Peak grad:   25.2 mmHg MV Decel Time: 151 msec    TR Vmax:        251.00 cm/s MR Peak grad: 107.3 mmHg MR Vmax:      518.00 cm/s  SHUNTS MV E velocity: 74.60 cm/s  Systemic VTI:  0.19 m MV A velocity: 37.70 cm/s  Systemic Diam: 2.20 cm MV E/A ratio:  1.98 Candee Furbish MD Electronically signed by Candee Furbish MD Signature Date/Time: 09/09/2019/12:06:06 PM    Final    ECHO TEE  Result Date: 09/11/2019    TRANSESOPHOGEAL ECHO REPORT   Patient Name:   [name] Date of Exam: 09/11/2019 Medical Rec #:  [id number]        Height:       74.0 in Accession #:    [id number]       Weight:       280.0 lb Date of Birth:  August 21, 1965        BSA:          2.508 m Patient Age:    68 years         BP:           108/65 mmHg Patient Gender: M                HR:  106 bpm. Exam Location:  Inpatient Procedure: Transesophageal Echo, Cardiac  Doppler and Color Doppler Indications:     Bacteremia.  History:         Patient has prior history of Echocardiogram examinations, most                  recent 09/09/2019. CAD, Aortic Valve Disease, Endocarditis,                  Tricuspid vegetation and Prosthetic Valve Complications,                  Signs/Symptoms:Altered Mental Status; Risk Factors:Diabetes,                  Hypertension and Dyslipidemia. Aortic valve replacement.                  Bentall procedure. Aortic dissection.                  Aortic Valve: unknown Medtronic tilting disk valve is present                  in the aortic position. Procedure Date: 2000.  Sonographer:     Roseanna Rainbow RDCS Referring Phys:  [id number] RHONDA G BARRETT Diagnosing Phys: Sanda Klein MD PROCEDURE: After discussion of the risks and benefits of a TEE, an informed consent was obtained from the patient. The transesophogeal probe was passed without difficulty through the esophogus of the patient. Imaged were obtained with the patient in a left lateral decubitus position. Sedation performed by different physician. The patient was monitored while under deep sedation. Anesthestetic sedation was provided intravenously by Anesthesiology: 246m of Propofol. The patient's vital signs; including heart rate, blood pressure, and oxygen saturation; remained stable throughout the procedure. The patient developed no complications during the procedure. IMPRESSIONS  1. Left ventricular ejection fraction, by estimation, is 55 to 60%. The left ventricle has normal function. The left ventricle has no regional wall motion abnormalities. There is mild concentric left ventricular hypertrophy.  2. Right ventricular systolic function is normal. The right ventricular size is mildly enlarged. There is mildly elevated pulmonary artery systolic pressure. The estimated right ventricular systolic pressure is 301.0mmHg.  3. It appears that the left atrial appendage has been surgically resected or  oversown. Left atrial size was severely dilated. No left atrial/left atrial appendage thrombus was detected. The LAA emptying velocity was 35 cm/s.  4. Right atrial size was severely dilated.  5. The mitral valve is normal in structure. Mild mitral valve regurgitation.  6. There is a large, moderately mobile vegetation attached to the septal leaflet of the tricuspid valve, measuring 27 mm in length and 11 mm in width. It appears heterogeneous and friable.. Tricuspid valve regurgitation is moderate.  7. There is a large vegetation attached to the medial aspect of posterior disc or the posteromedial mechanical valve annulus. It is highly mobile and prolapses readily across the valve orifice. It measures 15 mm in length and 6 mm in width. Although there is no evidence of a periannular abscess cavity, the periannular tissue echodensity is heterogeneous, raining concern for inflammation/early abscess formation. The proximity of the aortic and the tricuspid vegetation to the same area of the interventricular septum also raises concern for intramyocardial abscess or fistula formation. The aortic valve has been repaired/replaced. Aortic valve regurgitation is trivial. Mild aortic valve stenosis. There is a unknown Medtronic tilting disk valve present in the aortic position. Procedure Date: 2000.  Aortic valve mean gradient measures 20.0 mmHg.  8. There is mild (Grade II) atheroma plaque involving the transverse aorta. Comparison(s): Compared to previous reports, there is evidence of vegetations on the tricuspid valve and the mechanical aortic valve and suspicion for early perivalvular abscess. The dimensionless aortic vale index is similar to older studies. This suggests that the increased aortic valve gradients are due to increased cardiac output (rather than obstruction from the vegetation or compromised prosthetic disc motion). FINDINGS  Left Ventricle: Left ventricular ejection fraction, by estimation, is 55 to 60%. The  left ventricle has normal function. The left ventricle has no regional wall motion abnormalities. The left ventricular internal cavity size was normal in size. There is  mild concentric left ventricular hypertrophy. Abnormal (paradoxical) septal motion consistent with post-operative status. Right Ventricle: The right ventricular size is mildly enlarged. No increase in right ventricular wall thickness. Right ventricular systolic function is normal. There is mildly elevated pulmonary artery systolic pressure. The tricuspid regurgitant velocity is 2.48 m/s, and with an assumed right atrial pressure of 8 mmHg, the estimated right ventricular systolic pressure is 40.3 mmHg. Left Atrium: It appears that the left atrial appendage has been surgically resected or oversown. Left atrial size was severely dilated. Spontaneous echo contrast was present in the left atrium. No left atrial/left atrial appendage thrombus was detected. The LAA emptying velocity was 35 cm/s. Right Atrium: Right atrial size was severely dilated. Prominent Eustachian valve. Pericardium: There is no evidence of pericardial effusion. Mitral Valve: The mitral valve is normal in structure. Mild mitral valve regurgitation, with centrally-directed jet. Tricuspid Valve: There is a large, moderately mobile vegetation attached to the septal leaflet of the tricuspid valve, measuring 27 mm in length and 11 mm in width. It appears heterogeneous and friable. The tricuspid valve is normal in structure. Tricuspid valve regurgitation is moderate. Aortic Valve: There is a large vegetation attached to the medial aspect of posterior disc or the posteromedial mechanical valve annulus. It is highly mobile and prolapses readily across the valve orifice. It measures 15 mm in length and 6 mm in width. Although there is no evidence of a periannular abscess cavity, the periannular tissue echodensity is heterogeneous, raining concern for inflammation/early abscess formation. The  proximity of the aortic and the tricuspid vegetation to the same area of the  interventricular septum also raises concern for intramyocardial abscess or fistula formation. The aortic valve has been repaired/replaced. Aortic valve regurgitation is trivial. Mild aortic stenosis is present. Aortic valve mean gradient measures 20.0 mmHg. Aortic valve peak gradient measures 32.9 mmHg. There is a unknown [employer] tilting disk valve present in the aortic position. Procedure Date: 2000. Pulmonic Valve: The pulmonic valve was normal in structure. Pulmonic valve regurgitation is trivial. Aorta: The aortic root, ascending aorta, aortic arch and descending aorta are all structurally normal, with no evidence of dilitation or obstruction. There is mild (Grade II) atheroma plaque involving the transverse aorta. IAS/Shunts: No atrial level shunt detected by color flow Doppler.  AORTIC VALVE AV Vmax:           287.00 cm/s AV Vmean:          208.000 cm/s AV VTI:            0.444 m AV Peak Grad:      32.9 mmHg AV Mean Grad:      20.0 mmHg LVOT Vmax:         126.00 cm/s LVOT Vmean:        90.500  cm/s LVOT VTI:          0.223 m LVOT/AV VTI ratio: 0.50 TRICUSPID VALVE TR Peak grad:   24.6 mmHg TR Vmax:        248.00 cm/s  SHUNTS Systemic VTI: 0.22 m Dani Gobble Croitoru MD Electronically signed by Sanda Klein MD Signature Date/Time: 09/11/2019/2:35:26 PM    Final    ECHO INTRAOPERATIVE TEE  Result Date: 10/07/2019  *INTRAOPERATIVE TRANSESOPHAGEAL REPORT *  Patient Name:   [name] Date of Exam: 09/26/2019 Medical Rec #:  [id number]        Height:       74.0 in Accession #:    [id number]       Weight:       291.1 lb Date of Birth:  22-Apr-1966        BSA:          2.55 m Patient Age:    25 years         BP:           128/86 mmHg Patient Gender: M                HR:           93 bpm. Exam Location:  Anesthesiology Transesophogeal exam was perform intraoperatively during surgical procedure. Patient was closely monitored under general  anesthesia during the entirety of examination. Indications:     Endocarditis, TV replacement Sonographer:     Dustin Flock RDCS Performing Phys: [id number] Diagnosing Phys: Myrtie Soman MD Complications: No known complications during this procedure. POST-OP IMPRESSIONS - Left Ventricle: The left ventricle is unchanged from pre-bypass. - Aorta: A graft was placed in the ascending aorta for repair. - Left Atrial Appendage: The left atrial appendage appears unchanged from pre-bypass. - Aortic Valve: There is no regurgitation.Homograft placed. - Tricuspid Valve: A bioprosthetic bioprosthetic valve was placed, leaflets are not freely mobile. - Pericardium: The pericardium appears unchanged from pre-bypass. - Comments: TV moving well post bypass No perivalvular leak in either the tricuspid or aortic valve post bypass. PRE-OP FINDINGS  Left Ventricle: The left ventricle has normal systolic function, with an ejection fraction of 55-60%. The cavity size was mildly dilated. There is no increase in left ventricular wall thickness. No evidence of left ventricular regional wall motion abnormalities. Right Ventricle: The right ventricle has normal systolic function. The cavity was mildly enlarged. There is increased right ventricular wall thickness. Left Atrium: Left atrial size was dilated. The left atrial appendage is well visualized and there is no evidence of thrombus present. Has been surgically closed and the repair appears to be oversewn. Right Atrium: Right atrial size was dilated. Right atrial pressure is estimated at 10 mmHg. Interatrial Septum: No atrial level shunt detected by color flow Doppler. Pericardium: There is no evidence of pericardial effusion. Mitral Valve: The mitral valve is normal in structure. Mild thickening of the mitral valve leaflet. Mild calcification of the mitral valve leaflet. Mitral valve regurgitation is mild to moderate by color flow Doppler. The MR jet is centrally-directed.  There is no evidence of mitral valve vegetation. There is No evidence of mitral stenosis. Tricuspid Valve: The tricuspid valve was normal in structure. Tricuspid valve regurgitation is moderate-severe by color flow Doppler. The jet is directed toward the right atrial free wall. The tricuspid valve is mildly thickened. There is mild prolapse of the tricuspid. There is a large mobile tricuspid valve vegetation on the septal leaflet. Aortic Valve: Previously replaced AV There  is moderate calcification of the aortic valve Aortic valve regurgitation is moderate to severe by color flow Doppler. The jet is posteriorly-directed. There is no evidence of aortic valve stenosis. aortic valve vegetation on previous TTE a vegetation was seen. On TEE no vegetation seen on AV. Pulmonic Valve: The pulmonic valve was normal in structure. Pulmonic valve regurgitation is not visualized by color flow Doppler. Aorta: The aortic arch was not well visualized. There is mild dilatation of the aortic root measuring 38 mm. There is a homograft seen in the position of the ascending aorta. 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 Pulmonary Artery: The pulmonary artery is not well seen. +-------------+---------++ AORTIC VALVE           +-------------+---------++ AV Mean Grad:24.0 mmHg +-------------+---------++  Myrtie Soman MD Electronically signed by Myrtie Soman MD Signature Date/Time: 10/07/2019/1:18:35 PM    Final    VAS US DOPPLER PRE CABG  Result Date: 09/12/2019 PREOPERATIVE VASCULAR EVALUATION  Indications:      Pre-CABG. Risk Factors:     Hypertension, hyperlipidemia, Diabetes. Comparison Study: No prior studies. Performing Technologist: Carlos Levering Rvt  Examination Guidelines: A complete evaluation includes B-mode imaging, spectral Doppler, color Doppler, and power Doppler as needed of all accessible portions of each vessel. Bilateral testing is considered an integral  part of a complete examination. Limited examinations for reoccurring indications may be performed as noted.  Right Carotid Findings: +----------+--------+--------+--------+-----------------------+--------+           PSV cm/sEDV cm/sStenosisDescribe               Comments +----------+--------+--------+--------+-----------------------+--------+ CCA Prox  157     16              smooth and heterogenoustortuous +----------+--------+--------+--------+-----------------------+--------+ CCA Distal46      13              smooth and heterogenous         +----------+--------+--------+--------+-----------------------+--------+ ICA Prox  50      16              smooth and heterogenous         +----------+--------+--------+--------+-----------------------+--------+ ICA Distal42      18                                     tortuous +----------+--------+--------+--------+-----------------------+--------+ ECA       74      8                                               +----------+--------+--------+--------+-----------------------+--------+ Portions of this table do not appear on this page. +----------+--------+-------+--------+------------+           PSV cm/sEDV cmsDescribeArm Pressure +----------+--------+-------+--------+------------+ Subclavian81                     134          +----------+--------+-------+--------+------------+ +---------+--------+--+--------+--+---------+ VertebralPSV cm/s33EDV cm/s10Antegrade +---------+--------+--+--------+--+---------+ Left Carotid Findings: +----------+--------+--------+--------+-----------------------+--------+           PSV cm/sEDV cm/sStenosisDescribe               Comments +----------+--------+--------+--------+-----------------------+--------+ CCA Prox  65      16  smooth and heterogenous         +----------+--------+--------+--------+-----------------------+--------+ CCA Distal62      9                smooth and heterogenous         +----------+--------+--------+--------+-----------------------+--------+ ICA Prox  40      17                                     tortuous +----------+--------+--------+--------+-----------------------+--------+ ICA Distal56      29                                     tortuous +----------+--------+--------+--------+-----------------------+--------+ ECA       45      6                                               +----------+--------+--------+--------+-----------------------+--------+ +----------+--------+--------+--------+------------+ SubclavianPSV cm/sEDV cm/sDescribeArm Pressure +----------+--------+--------+--------+------------+           71                      136          +----------+--------+--------+--------+------------+ +---------+--------+--+--------+-+---------+ VertebralPSV cm/s38EDV cm/s9Antegrade +---------+--------+--+--------+-+---------+  ABI Findings: +--------+------------------+-----+---------+--------+ Right   Rt Pressure (mmHg)IndexWaveform Comment  +--------+------------------+-----+---------+--------+ [id number]                    triphasic         +--------+------------------+-----+---------+--------+ PTA     147               1.08 triphasic         +--------+------------------+-----+---------+--------+ DP      176               1.29 triphasic         +--------+------------------+-----+---------+--------+ +--------+------------------+-----+---------+-------+ Left    Lt Pressure (mmHg)IndexWaveform Comment +--------+------------------+-----+---------+-------+ [id number]                    triphasic        +--------+------------------+-----+---------+-------+ PTA     122               0.90 triphasic        +--------+------------------+-----+---------+-------+ DP      155               1.14 triphasic        +--------+------------------+-----+---------+-------+  +-------+---------------+----------------+ ABI/TBIToday's ABI/TBIPrevious ABI/TBI +-------+---------------+----------------+ Right  1.29                            +-------+---------------+----------------+ Left   1.14                            +-------+---------------+----------------+  Right Doppler Findings: +--------+--------+-----+---------+--------+ Site    PressureIndexDoppler  Comments +--------+--------+-----+---------+--------+ [id number]          triphasic         +--------+--------+-----+---------+--------+ Radial               triphasic         +--------+--------+-----+---------+--------+ Ulnar  triphasic         +--------+--------+-----+---------+--------+  Left Doppler Findings: +--------+--------+-----+---------+--------+ Site    PressureIndexDoppler  Comments +--------+--------+-----+---------+--------+ [id number]          triphasic         +--------+--------+-----+---------+--------+ Radial               triphasic         +--------+--------+-----+---------+--------+ Ulnar                triphasic         +--------+--------+-----+---------+--------+  Summary: Right Carotid: Velocities in the right ICA are consistent with a 1-39% stenosis. Left Carotid: Velocities in the left ICA are consistent with a 1-39% stenosis. Vertebrals: Bilateral vertebral arteries demonstrate antegrade flow. Right ABI: Resting right ankle-brachial index is within normal range. No evidence of significant right lower extremity arterial disease. Left ABI: Resting left ankle-brachial index is within normal range. No evidence of significant left lower extremity arterial disease. Right Upper Extremity: Doppler waveforms decrease >50% with right radial compression. Doppler waveform obliterate with right ulnar compression. Left Upper Extremity: Doppler waveform obliterate with left radial compression. Doppler waveforms remain within normal limits with left  ulnar compression.  Electronically signed by Monica Martinez MD on 09/12/2019 at 8:06:35 PM.    Final    [place] EKG SITE RITE  Result Date: 10/06/2019 If Site Rite image not attached, placement could not be confirmed due to current cardiac rhythm.  [place] EKG SITE RITE  Result Date: 09/14/2019 If Site Rite image not attached, placement could not be confirmed due to current cardiac rhythm.  CT Angio Abd/Pel w/ and/or w/o  Result Date: 09/12/2019 CLINICAL DATA:  54 year old male under preoperative evaluation prior to potential redo Bentall procedure. EXAM: CT ANGIOGRAPHY CHEST, ABDOMEN AND PELVIS TECHNIQUE: Non-contrast CT of the chest was initially obtained. Multidetector CT imaging through the chest, abdomen and pelvis was performed using the standard protocol during bolus administration of intravenous contrast. Multiplanar reconstructed images and MIPs were obtained and reviewed to evaluate the vascular anatomy. CONTRAST:  126m OMNIPAQUE IOHEXOL 350 MG/ML SOLN COMPARISON:  Chest CTA 10/09/2014. CTA of the chest, abdomen and pelvis 07/24/2013. FINDINGS: CTA CHEST FINDINGS Cardiovascular: Heart size is enlarged. There is no significant pericardial fluid, thickening or pericardial calcification. 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 There is a small defect on the undersurface of the mechanical aortic valve best appreciated on coronal image 82 of series 8 measuring approximately 8 mm, which may represent valve associated thrombus and/or vegetation. The aortic arch is mildly aneurysmal measuring up to 4.3 cm in diameter. There is a residual dissection flap in the proximal and mid aortic arch which does not propagate into the great vessels. Descending thoracic aorta is normal in caliber measuring 2.8 cm in diameter. Bovine type thoracic aortic arch incidentally noted. There is a right coronary artery bypass graft. Large filling defect  in the superior aspect of the right atrium immediately posterior to the septal leaflet of the tricuspid valve, and intimately associated with the adjacent aortic root, estimated to measure approximately 2.2 x 2.2 x 1.7 cm (axial image 83 of series 6 and coronal image 73 of series 8), presumably a vegetation. Mediastinum/Nodes: No pathologically enlarged mediastinal or hilar lymph nodes. Esophagus is unremarkable in appearance. No axillary lymphadenopathy. Lungs/Pleura: No suspicious pulmonary nodules or masses are noted. No acute consolidative airspace disease. No pleural effusions. Musculoskeletal: In the T10 vertebral body there is a  large lucent lesion with extension of soft tissue into the right paravertebral region and involvement of the head of the right tenth rib overall estimated to measure approximately 4.9 x 3.4 x 2.0 cm (axial image 105 of series 6 and sagittal image 117 of series 9). This causes significant narrowing of the central spinal canal (approximately 5 mm AP) at this level. Median sternotomy wires. Review of the MIP images confirms the above findings. CTA ABDOMEN AND PELVIS FINDINGS VASCULAR Aorta: Mild aortic atherosclerosis. Normal caliber aorta without aneurysm, dissection, vasculitis or significant stenosis. Celiac: Patent without evidence of aneurysm, dissection, vasculitis or significant stenosis. SMA: Patent without evidence of aneurysm, dissection, vasculitis or significant stenosis. Renals: Both renal arteries are patent without evidence of aneurysm, dissection, vasculitis, fibromuscular dysplasia or significant stenosis. IMA: Patent without evidence of aneurysm, dissection, vasculitis or significant stenosis. Inflow: Patent without evidence of aneurysm, dissection, vasculitis or significant stenosis. Veins: No obvious venous abnormality within the limitations of this arterial phase study. Review of the MIP images confirms the above findings. NON-VASCULAR Hepatobiliary: No suspicious  appearing cystic or solid hepatic lesions are confidently identified on today's arterial phase examination. No intra or extrahepatic biliary ductal dilatation. Several partially calcified gallstones are noted measuring up to 1.6 cm in diameter. Gallbladder is otherwise unremarkable in appearance. Pancreas: No pancreatic mass. No pancreatic ductal dilatation. No pancreatic or peripancreatic fluid collections or inflammatory changes. Spleen: Unremarkable. Adrenals/Urinary Tract: Bilateral kidneys and adrenal glands are normal in appearance. No hydroureteronephrosis. Urinary bladder is normal in appearance. Stomach/Bowel: Normal appearance of the stomach. No pathologic dilatation of small bowel or colon. The appendix is not confidently identified and may be surgically absent. Regardless, there are no inflammatory changes noted adjacent to the cecum to suggest the presence of an acute appendicitis at this time. Lymphatic: No lymphadenopathy noted in the abdomen or pelvis. Reproductive: Prostate gland and seminal vesicles are unremarkable in appearance. Other: No significant volume of ascites.  No pneumoperitoneum. Musculoskeletal: There are no aggressive appearing lytic or blastic lesions noted in the visualized portions of the skeleton. Review of the MIP images confirms the above findings. IMPRESSION: 1. 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 2. Small lesions associated with the undersurface of the mechanical aortic valve and the superior aspect of the septal leaflet of the tricuspid valve, which may reflect valve associated thrombus and/or vegetations, as detailed above. 3. Aggressive appearing lesion in T10 vertebral body with extends in into the right paravertebral soft tissues and involvement of the head of the right tenth rib, with marked narrowing  of the central spinal canal at this level, as detailed above. Primary differential considerations include a plasmacytoma, or less likely and osteoblastoma or solitary metastatic lesion (no primary lesion is confidently identified). If there is clinical concern for spinal cord compression, the full extent of this lesion could be better evaluated with thoracic spine MRI with and without IV gadolinium. 4. Bovine type thoracic aortic arch (normal anatomical variant) incidentally noted. 5. Cholelithiasis without evidence of acute cholecystitis at this time. 6. Additional incidental findings, as above. These results will be called to the ordering clinician or representative by the Radiologist Assistant, and communication documented in the PACS or [employer]. Electronically Signed   By: Vinnie Langton M.D.   On: 09/12/2019 13:02    Discharge Instructions    [facility] pharmacist to adjust dose for  Vancomycin, Aminoglycosides and other anti-infective therapies as requested by physician.   Complete by: As directed    [facility] to provide Cath Flo 72m   Complete by: As directed    Administer for PICC line occlusion and as ordered by physician for other access device issues.   Amb Referral to Cardiac Rehabilitation   Complete by: As directed    Diagnosis:  CABG Other     CABG X ___: 3   After initial evaluation and assessments completed: Virtual Based Care may be provided alone or in conjunction with Phase 2 Cardiac Rehab based on patient barriers.: Yes   Anaphylaxis Kit: Provided to treat any anaphylactic reaction to the medication being provided to the patient if First Dose or when requested by physician   Complete by: As directed    Epinephrine 132mml vial / amp: Administer 0.46m47m0.46ml346mubcutaneously once for moderate to severe anaphylaxis, nurse to call physician and pharmacy when reaction occurs and call 911 if needed for immediate care   Diphenhydramine 50mg32mIV  vial: Administer 25-50mg 35mM PRN for first dose reaction, rash, itching, mild reaction, nurse to call physician and pharmacy when reaction occurs   Sodium Chloride 0.9% NS 500ml I71mdminister if needed for hypovolemic blood pressure drop or as ordered by physician after call to physician with anaphylactic reaction   Change dressing on IV access line weekly and PRN   Complete by: As directed    Flush IV access with Sodium Chloride 0.9% and Heparin 10 units/ml or 100 units/ml   Complete by: As directed    Home infusion instructions - [facility]   Complete by: As directed    Instructions: Flush IV access with Sodium Chloride 0.9% and Heparin 10units/ml or 100units/ml   Change dressing on IV access line: Weekly and PRN   Instructions Cath Flo 2mg: Ad80mister for PICC Line occlusion and as ordered by physician for other access device   [facility] pharmacist to adjust dose for: Vancomycin, Aminoglycosides and other anti-infective therapies as requested by physician   Method of administration may be changed at the discretion of home infusion pharmacist based upon assessment of the patient and/or caregiver's ability to self-administer the medication ordered   Complete by: As directed       Discharge Medications: Allergies as of 10/07/2019      Reactions   Diltiazem Hives   Insulin Glargine Swelling   edema      Medication List    STOP taking these medications   amLODipine 10 MG tablet Commonly known as: NORVASC   clopidogrel 75 MG tablet Commonly known as: PLAVIX   furosemide 40 MG tablet Commonly known as: LASIX   glimepiride 1 MG tablet Commonly known as: AMARYL   losartan 100 MG tablet Commonly known as: COZAAR   metFORMIN 1000 MG tablet Commonly known as: GLUCOPHAGE   nitroGLYCERIN 0.4 MG SL tablet Commonly known as: NITROSTAT   potassium chloride SA 20 MEQ tablet Commonly known as: KLOR-CON     TAKE these medications   amiodarone 200 MG  tablet Commonly known as: PACERONE Take 1 tablet (200 mg total) by mouth 2 (two) times daily. For one week then take 200 mg daily thereafter   aspirin EC 81 MG tablet Take 1 tablet (81 mg total) by mouth daily as needed (headache). What changed: how much to take   atorvastatin 40 MG tablet Commonly known as: LIPITOR TAKE 1 TABLET EVERY DAY  AT  6  PM  What changed:   how much to take  how to take this  when to take this  additional instructions   blood glucose meter kit and supplies Kit Dispense based on patient and insurance preference. Use up to four times daily as directed. (FOR ICD-9 250.00, 250.01).   cefTRIAXone  IVPB Commonly known as: ROCEPHIN Inject 2 g into the vein daily. Indication:  Streptococcus infantaris endocarditis First Dose: Yes Last Day of Therapy:  11/07/2019 Labs - Once weekly:  CBC/D and BMP, Labs - Every other week:  ESR and CRP Method of administration: IV Push Method of administration may be changed at the discretion of home infusion pharmacist based upon assessment of the patient and/or caregiver's ability to self-administer the medication ordered.   cholecalciferol 1000 units tablet Commonly known as: VITAMIN D Take 1,000 Units by mouth daily with breakfast.   ferrous [id number] C-folic acid capsule Commonly known as: TRINSICON / FOLTRIN Take 1 capsule by mouth daily. For one month then stop. If develops constipation, may stop sooner   gabapentin 300 MG capsule Commonly known as: NEURONTIN Take 1 capsule (300 mg total) by mouth daily with breakfast.   guaiFENesin 600 MG 12 hr tablet Commonly known as: MUCINEX Take 1 tablet (600 mg total) by mouth 2 (two) times daily as needed for cough or to loosen phlegm.   insulin detemir 100 UNIT/ML injection Commonly known as: LEVEMIR Inject 0.18 mLs (18 Units total) into the skin daily.   levothyroxine 50 MCG tablet Commonly known as: SYNTHROID TAKE 1 TABLET EVERY DAY What changed:  when to take this   metoprolol succinate 50 MG 24 hr tablet Commonly known as: Toprol XL Take 1 tablet (50 mg total) by mouth daily. Take with or immediately following a meal. What changed:   medication strength  how much to take   traMADol 50 MG tablet Commonly known as: ULTRAM Take 1 tablet (50 mg total) by mouth every 12 (twelve) hours as needed for moderate pain.   True Metrix Air Glucose Meter Devi 1 Device by Does not apply route daily. E11.9   True Metrix Blood Glucose Test test strip Generic drug: glucose blood Use as directed once daily   Vitamin D (Ergocalciferol) 1.25 MG (50000 UNIT) Caps capsule Commonly known as: DRISDOL Take 1 capsule (50,000 Units total) by mouth every Tuesday. Start taking on: Oct 08, 2019   warfarin 5 MG tablet Commonly known as: COUMADIN Take as directed. If you are unsure how to take this medication, talk to your nurse or doctor. Original instructions: TAKE 1 TO 1 AND 1/2 TABLETS EVERY DAY AS DIRECTED BY COUMADIN CLINIC What changed:   how much to take  how to take this  when to take this  additional instructions            Durable Medical Equipment  (From admission, onward)         Start     Ordered   10/03/19 1622  For home use only DME Walker  Once    Comments: HD  Question:  Patient needs a walker to treat with the following condition  Answer:  Weakness generalized   10/03/19 1621   10/03/19 1621  For home use only DME 3 n 1  Once    Comments: HD   10/03/19 1621           Discharge Care Instructions  (From admission, onward)         Start     Ordered  10/07/19 0000  Change dressing on IV access line weekly and PRN  (Home infusion instructions - [facility] )     10/07/19 0747         The patient has been discharged on:   1.Beta Blocker:  Yes [  x ]                              No   [   ]                              If No, reason:  2.Ace Inhibitor/ARB: Yes [   ]                                      No  [ x   ]                                     If No, reason:Elevated creatinine  3.Statin:   Yes [ x  ]                  No  [   ]                  If No, reason:  4.Ecasa:  Yes  [ x  ]                  No   [   ]                  If No, reason:  Follow Up Appointments: Follow-up Information    Call Lenn Cal, DDS.   Specialty: Dentistry Why: Call for follow-up appointment for evaluation of healing and suture removal as needed once discharged from anticipated heart valve surgery. Contact information: [place] [address] [phone]        Gaye Pollack, MD. Go on 10/16/2019.   Specialty: Cardiothoracic Surgery Why: You have two appointments with Dr. Cyndia Bent: 10/16/19 at 10:30am You will have lab work prior to appt.  06/09 at 10:30 am Please arrive 37mn early for a chest [facility] of [facility] same [facility].    Contact information: [address] [place] [phone]        JBiagio Borg MD. Go on 11/26/2019.   Specialties: Internal Medicine, Radiology Why: Appointment time is at 11:20 am and is for a follow up appointment regarding further diabetes mananagement and surveillance of HGA1C 7.3 Contact information: [phone]        VTommy Medal CLavell Islam MD Follow up.   Specialty: Infectious Diseases Why: 10/23/19 at 11am. Please call to reschedule if you are not able to make this appointment.  Contact information: [phone]        [facility] [address] [facility] Follow up on 10/09/2019.   Specialty: Cardiology Why: Appointment is for PT and INR (on Coumadin for a fib) to be drawn. Appointment scheduled for 10/09/2019 at 1:45pm. Contact information: [address] [place]  Roby Lofts M., PA-C Follow up on 10/25/2019.   Specialty: Physician Assistant Why: Appointment time is at 8:45 am Contact information: [place] [address] [phone]           Signed: Sharalyn Ink [facility] 10/07/2019, 1:24 PM

## 2019-09-27 NOTE — Transfer of Care (Signed)
Immediate Anesthesia Transfer of Care Note  Patient: [name]  Procedure(s) Performed: REDO STERNOTOMY (N/A Chest) REDO ASCENDING AORTIC ROOT REPLACEMENT USING LIFENET 25 MM CRYO, AORTIC VALVE HOMOGRAFT AND 30 MM HEMASHIELD PLATINUM GRAFT (N/A Chest) CORONARY ARTERY BYPASS GRAFTING (CABG), ON PUMP, TIMES THREE, USING ENDOSCOPICALLY HARVESTED LEFT GREATER SAPHENOUS VEIN (N/A Chest) TRANSESOPHAGEAL ECHOCARDIOGRAM (TEE) (N/A )  Patient Location: ICU  Anesthesia Type:General  Level of Consciousness: sedated and Patient remains intubated per anesthesia plan  Airway & Oxygen Therapy: Patient remains intubated per anesthesia plan and Patient placed on Ventilator (see vital sign flow sheet for setting)  Post-op Assessment: Report given to RN and Post -op Vital signs reviewed and stable  Post vital signs: Reviewed and stable  Last Vitals:  Vitals Value Taken Time  BP 113/81 09/27/19 0126  Temp    Pulse 80 09/27/19 0130  Resp 22 09/27/19 0130  SpO2 98 % 09/27/19 0130  Vitals shown include unvalidated device data.  Last Pain:  Vitals:   09/26/19 0426  TempSrc: Oral  PainSc:       Patients Stated Pain Goal: 0 ([phone])  Complications: No apparent anesthesia complications

## 2019-09-27 NOTE — Addendum Note (Signed)
Addendum  created 09/27/19 0731 by Josephine Igo, CRNA   Order list changed

## 2019-09-27 NOTE — Progress Notes (Signed)
Patient ID: [name], male   DOB: July 01, 1965, 54 y.o.   MRN: [id number]         [facility]  Date of Admission:  09/08/2019           Day 19 ceftriaxone        Day 17 gentamicin ASSESSMENT: Although no vegetations were found at the time of surgery yesterday he will need to continue treatment for presumed prosthetic valve endocarditis.  Fortunately he continues to tolerate gentamicin therapy.  I will continue his current 2 drug antibiotic regimen and await results of operative cultures.  Optimal duration of total antibiotic therapy postoperatively will depend on culture results.  PLAN: 1. Continue ceftriaxone and gentamicin 2. 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 sodium chloride   Intravenous Once  . acetaminophen  1,000 mg Oral Q6H   Or  . acetaminophen (TYLENOL) oral liquid 160 mg/5 mL  1,000 mg Per Tube Q6H  . aspirin EC  325 mg Oral Daily   Or  . aspirin  324 mg Per Tube Daily  . atorvastatin  40 mg Oral Q breakfast  . bisacodyl  10 mg Oral Daily   Or  . bisacodyl  10 mg Rectal Daily  . Chlorhexidine Gluconate Cloth  6 each Topical Daily  . colchicine  0.6 mg Oral BID  . docusate sodium  200 mg Oral Daily  . [START ON 09/28/2019] feeding supplement  1 Container Oral BID BM  . feeding supplement (ENSURE ENLIVE)  237 mL Oral BID BM  . feeding supplement (PRO-STAT SUGAR FREE 64)  30 mL Oral BID  . gabapentin  300 mg Oral Q breakfast  . levothyroxine  50 mcg Oral  Q0600  . multivitamin with minerals  1 tablet Oral Daily  . [START ON 09/28/2019] pantoprazole  40 mg Oral Daily  . sodium chloride flush  3 mL Intravenous Q12H   Continuous Infusions: . sodium chloride 10 mL/hr at 09/27/19 0900  . sodium chloride    . sodium chloride 20 mL/hr at 09/27/19 0200  . albumin human 12.5 g (09/27/19 0515)  . cefTRIAXone (ROCEPHIN)  IV 2 g (09/27/19 1005)  . dexmedetomidine (PRECEDEX) IV infusion 0.4 mcg/kg/hr (09/27/19 0900)  . DOPamine 2 mcg/kg/min (09/27/19 0900)  . epinephrine 2 mcg/min (09/27/19 0900)  . famotidine (PEPCID) IV Stopped (09/27/19 [id number])  . gentamicin 220 mg (09/25/19 1405)  . insulin 3.6 mL/hr at 09/27/19 0900  . lactated ringers    . lactated ringers    . lactated ringers 20 mL/hr at 09/27/19 0120  . magnesium sulfate    . nitroGLYCERIN    . norepinephrine (LEVOPHED) Adult infusion 3 mcg/min (09/27/19 0900)  . phenylephrine (NEO-SYNEPHRINE) Adult infusion     PRN Meds:.sodium chloride, albumin human, dextrose, lactated ringers, metoprolol tartrate, midazolam, morphine injection, ondansetron (ZOFRAN) IV, oxyCODONE, sodium chloride flush, traMADol   SUBJECTIVE: Underwent cardiac surgery yesterday.  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  He had his aortic root replaced with coronary artery bypass grafting x3.  Abscess Gram stain did not show any organisms but cultures are pending.  Review of Systems: Review of Systems  Unable to perform ROS: Intubated    Allergies  Allergen Reactions  . Diltiazem Hives  . Insulin Glargine Swelling    edema    OBJECTIVE: Vitals:   09/27/19 0730 09/27/19 0739 09/27/19 0800 09/27/19 0808  BP: 137/81   100/64  Pulse:  89 89 90  Resp:  (!) 21 (!) 21 (!) 23  Temp:  99.1 F (37.3 C) 99.3 F (37.4 C)   TempSrc:      SpO2:   100%    Weight:      Height:       Body mass index is 37.38 kg/m.  Physical Exam Constitutional:      Comments: He opens eyes to voice.  He remains intubated.  Cardiovascular:     Rate and Rhythm: Normal rate and regular rhythm.     Heart sounds: No murmur.  Pulmonary:     Breath sounds: Normal breath sounds.     Lab Results Lab Results  Component Value Date   WBC 9.1 09/27/2019   HGB 7.9 (L) 09/27/2019   HCT 22.9 (L) 09/27/2019   MCV 85.8 09/27/2019   PLT 75 (L) 09/27/2019    Lab Results  Component Value Date   CREATININE 1.88 (H) 09/27/2019   BUN 17 09/27/2019   NA 142 09/27/2019   K 4.4 09/27/2019   CL 114 (H) 09/27/2019   CO2 20 (L) 09/27/2019    Lab Results  Component Value Date   ALT 25 09/26/2019   AST 37 09/26/2019   ALKPHOS 73 09/26/2019   BILITOT 0.8 09/26/2019     Microbiology: Recent Results (from the past 240 hour(s))  Surgical pcr screen     Status: None   Collection Time: 09/23/19  4:39 AM   Specimen: Nasal Mucosa; Nasal Swab  Result Value Ref Range Status   MRSA, PCR NEGATIVE NEGATIVE Final   Staphylococcus aureus NEGATIVE NEGATIVE Final    Comment: (NOTE) The Xpert SA Assay (FDA approved for NASAL specimens in patients 44 years of age and older), is one component of a comprehensive surveillance program. It is not intended to diagnose infection nor to guide or monitor treatment. Performed at [facility], [place] [address], [place], [place] [address]   Aerobic/Anaerobic Culture (surgical/deep wound)     Status: None (Preliminary result)   Collection Time: 09/26/19  1:03 PM   Specimen: Soft Tissue, Other  Result Value Ref Range Status   Specimen Description TISSUE  Final   Special Requests TRICUSPID VALVE VEGETATION SPEC A  Final   Gram Stain NO WBC SEEN NO ORGANISMS SEEN   Final   Culture   Final    NO GROWTH < 24 HOURS Performed at [facility], [place] [address], [place], [place] [address]    Report Status PENDING   Incomplete  Aerobic/Anaerobic Culture (surgical/deep wound)     Status: None (Preliminary result)   Collection Time: 09/26/19  1:47 PM   Specimen: PATH Soft tissue resection  Result Value Ref Range Status   Specimen Description TISSUE  Final   Special Requests EXPLANTED AORTIC VALVE GRAFT SPEC B  Final   Gram Stain NO WBC SEEN NO ORGANISMS SEEN  Final   Culture   Final    NO GROWTH < 24 HOURS Performed at [facility], [place] [address], [place], [place] [address]    Report Status PENDING  Incomplete    Michel Bickers, MD [facility] [place] [phone] pager   [phone] cell 09/27/2019, 11:06 AM

## 2019-09-27 NOTE — Progress Notes (Signed)
Patient ID: [name], male   DOB: 1965-11-28, 54 y.o.   MRN: [id number] EVENING ROUNDS NOTE :     [address]       [place], [address]             [phone]                 1 Day Post-Op Procedure(s) (LRB): REDO STERNOTOMY (N/A) REDO ASCENDING AORTIC ROOT REPLACEMENT USING LIFENET 25 MM CRYO, AORTIC VALVE HOMOGRAFT AND 30 MM HEMASHIELD PLATINUM GRAFT (N/A) CORONARY ARTERY BYPASS GRAFTING (CABG), ON PUMP, TIMES THREE, USING ENDOSCOPICALLY HARVESTED LEFT GREATER SAPHENOUS VEIN (N/A) TRANSESOPHAGEAL ECHOCARDIOGRAM (TEE) (N/A)  Total Length of Stay:  LOS: 19 days  BP 118/64   Pulse 89   Temp 99 F (37.2 C)   Resp (!) 45   Ht 6\' 2"  (1.88 m)   Wt 132 kg   SpO2 96%   BMI 37.38 kg/m   .Intake/Output      05/07 0701 - 05/08 0700   I.V. (mL/kg) 443.6 (3.4)   Blood 252   IV Piggyback 163.5   Total Intake(mL/kg) 859.1 (6.5)   Urine (mL/kg/hr) 243 (0.1)   Blood    Chest Tube 250   Total Output 493   Net +366.1         . sodium chloride Stopped (09/27/19 1005)  . sodium chloride 250 mL (09/27/19 1731)  . sodium chloride 20 mL/hr at 09/27/19 0200  . albumin human 12.5 g (09/27/19 0515)  . cefTRIAXone (ROCEPHIN)  IV Stopped (09/27/19 1035)  . dexmedetomidine (PRECEDEX) IV infusion Stopped (09/27/19 1153)  . DOPamine 2 mcg/kg/min (09/27/19 1800)  . epinephrine 2 mcg/min (09/27/19 1800)  . famotidine (PEPCID) IV Stopped (09/27/19 [id number])  . gentamicin Stopped (09/27/19 1203)  . insulin 2.4 mL/hr at 09/27/19 1800  . lactated ringers    . lactated ringers    . lactated ringers 20 mL/hr at 09/27/19 0120  . nitroGLYCERIN    . norepinephrine (LEVOPHED) Adult infusion 5 mcg/min (09/27/19 1821)  . phenylephrine (NEO-SYNEPHRINE) Adult infusion       Lab Results  Component Value Date   WBC 14.4 (H) 09/27/2019   HGB 9.1 (L) 09/27/2019   HCT 26.6 (L) 09/27/2019   PLT 83 (L) 09/27/2019   GLUCOSE 163 (H) 09/27/2019   CHOL 132 04/02/2019   TRIG 123.0  04/02/2019   HDL 35.00 (L) 04/02/2019   LDLDIRECT 50.0 10/02/2018   LDLCALC 73 04/02/2019   ALT 25 09/26/2019   AST 37 09/26/2019   NA 142 09/27/2019   K 4.8 09/27/2019   CL 109 09/27/2019   CREATININE 2.60 (H) 09/27/2019   BUN 22 (H) 09/27/2019   CO2 20 (L) 09/27/2019   TSH 2.673 09/08/2019   PSA 1.04 10/02/2018   INR 1.0 09/27/2019   HGBA1C 7.3 (H) 09/26/2019   MICROALBUR 3.6 (H) 08/31/2017   Now extubated  Neuro intact Stable bp on current pressors Monitor uop and [place] MD  Beeper [phone] Office [phone] 09/27/2019 8:16 PM

## 2019-09-27 NOTE — Anesthesia Postprocedure Evaluation (Signed)
Anesthesia Post Note  Patient: [name]  Procedure(s) Performed: REDO STERNOTOMY (N/A Chest) REDO ASCENDING AORTIC ROOT REPLACEMENT USING LIFENET 25 MM CRYO, AORTIC VALVE HOMOGRAFT AND 30 MM HEMASHIELD PLATINUM GRAFT (N/A Chest) CORONARY ARTERY BYPASS GRAFTING (CABG), ON PUMP, TIMES THREE, USING ENDOSCOPICALLY HARVESTED LEFT GREATER SAPHENOUS VEIN (N/A Chest) TRANSESOPHAGEAL ECHOCARDIOGRAM (TEE) (N/A )     Patient location during evaluation: SICU Anesthesia Type: General Level of consciousness: patient remains intubated per anesthesia plan Pain management: pain level controlled Vital Signs Assessment: post-procedure vital signs reviewed and stable Respiratory status: patient remains intubated per anesthesia plan Cardiovascular status: stable Postop Assessment: no apparent nausea or vomiting Anesthetic complications: no    Last Vitals:  Vitals:   09/26/19 0426 09/27/19 0145  BP: 128/86 116/80  Pulse: 93 80  Resp: 20 (!) 22  Temp: 36.9 C (!) 35.5 C  SpO2: 100% 100%    Last Pain:  Vitals:   09/26/19 0426  TempSrc: Oral  PainSc:                  [name]

## 2019-09-27 NOTE — Progress Notes (Addendum)
TCTS DAILY ICU PROGRESS NOTE                   [address]            [place] [address]          [phone]   1 Day Post-Op Procedure(s) (LRB): REDO STERNOTOMY (N/A) REDO ASCENDING AORTIC ROOT REPLACEMENT USING LIFENET 25 MM CRYO, AORTIC VALVE HOMOGRAFT AND 30 MM HEMASHIELD PLATINUM GRAFT (N/A) CORONARY ARTERY BYPASS GRAFTING (CABG), ON PUMP, TIMES THREE, USING ENDOSCOPICALLY HARVESTED LEFT GREATER SAPHENOUS VEIN (N/A) TRANSESOPHAGEAL ECHOCARDIOGRAM (TEE) (N/A)  Total Length of Stay:  LOS: 19 days   Subjective: Intubated and sedated (per nurse, patient gets agitated and pulls at things with Precedex nearly off.  Objective: Vital signs in last 24 hours: Temp:  [95.7 F (35.4 C)-98.8 F (37.1 C)] 98.8 F (37.1 C) (05/07 0624) Pulse Rate:  [80-89] 89 (05/07 0624) Cardiac Rhythm: A-V Sequential paced (05/07 0600) Resp:  [11-22] 20 (05/07 0624) BP: (73-144)/(53-93) 144/85 (05/07 0615) SpO2:  [95 %-100 %] 100 % (05/07 0624) FiO2 (%):  [50 %] 50 % (05/07 0600)  Filed Weights   09/24/19 0559 09/25/19 0659 09/26/19 0430  Weight: 130.6 kg 130.9 kg 132 kg    Weight change:    Hemodynamic parameters for last 24 hours: PAP: (25-33)/(18-23) 31/23 CO:  [3.5 L/min-7.4 L/min] 7.4 L/min CI:  [1.4 L/min/m2-2.9 L/min/m2] 2.9 L/min/m2  Intake/Output from previous day: 05/06 0701 - 05/07 0700 In: 9943.3 [I.V.:4792.6; Blood:3963.3; IV Piggyback:1187.4] Out: 5360 [Urine:2050; Blood:3000; Chest Tube:310]  Intake/Output this shift: No intake/output data recorded.  Current Meds: Scheduled Meds: . sodium chloride   Intravenous Once  . acetaminophen  1,000 mg Oral Q6H   Or  . acetaminophen (TYLENOL) oral liquid 160 mg/5 mL  1,000 mg Per Tube Q6H  . aspirin EC  325 mg Oral Daily   Or  . aspirin  324 mg Per Tube Daily  . atorvastatin  40 mg Oral Q breakfast  . bisacodyl  10 mg Oral Daily   Or  . bisacodyl  10 mg Rectal Daily  . Chlorhexidine Gluconate Cloth  6  each Topical Daily  . colchicine  0.6 mg Oral BID  . docusate sodium  200 mg Oral Daily  . [START ON 09/28/2019] feeding supplement  1 Container Oral BID BM  . feeding supplement (ENSURE ENLIVE)  237 mL Oral BID BM  . feeding supplement (PRO-STAT SUGAR FREE 64)  30 mL Oral BID  . gabapentin  300 mg Oral Q breakfast  . levothyroxine  50 mcg Oral Q0600  . multivitamin with minerals  1 tablet Oral Daily  . [START ON 09/28/2019] pantoprazole  40 mg Oral Daily  . sodium chloride flush  3 mL Intravenous Q12H   Continuous Infusions: . sodium chloride 10 mL/hr at 09/27/19 0700  . sodium chloride    . sodium chloride 20 mL/hr at 09/27/19 0200  . albumin human 12.5 g (09/27/19 0515)  . cefTRIAXone (ROCEPHIN)  IV 2 g (09/25/19 1146)  . dexmedetomidine (PRECEDEX) IV infusion Stopped (09/27/19 0655)  . DOPamine 2 mcg/kg/min (09/27/19 0700)  . epinephrine 2 mcg/min (09/27/19 0533)  . famotidine (PEPCID) IV Stopped (09/27/19 [id number])  . gentamicin 220 mg (09/25/19 1405)  . insulin 3.6 mL/hr at 09/27/19 0700  . lactated ringers    . lactated ringers    . lactated ringers 20 mL/hr at 09/27/19 0120  . magnesium sulfate    . magnesium sulfate 20  mL/hr at 09/27/19 0700  . nitroGLYCERIN    . norepinephrine (LEVOPHED) Adult infusion 2 mcg/min (09/27/19 0700)  . phenylephrine (NEO-SYNEPHRINE) Adult infusion     PRN Meds:.sodium chloride, albumin human, dextrose, lactated ringers, metoprolol tartrate, midazolam, morphine injection, ondansetron (ZOFRAN) IV, oxyCODONE, sodium chloride flush, traMADol  General appearance: Intubated, sedated Neurologic: Unable to assess with sedation Heart: AV paced Lungs: Slightly diminished at bases but clear Abdomen: Soft, non tender, sporadic bowel sounds Extremities: SCDs, ace wrap on LLE Wound: Aquacel intact  Lab Results: CBC: Recent Labs    09/26/19 0458 09/26/19 0458 09/26/19 2119 09/27/19 0134 09/27/19 0140 09/27/19 0244  WBC 5.3  --   --   --  7.3  --     HGB 8.4*   < > 8.6*   < > 7.6* 6.8*  HCT 24.4*   < > 24.9*   < > 22.3* 20.0*  PLT 141*   < > 74*  --  92*  --    < > = values in this interval not displayed.   BMET:  Recent Labs    09/25/19 0533 09/25/19 0533 09/26/19 0458 09/26/19 0458 09/27/19 0134 09/27/19 0244  NA 139   < > 140   < > 145 145  K 3.6   < > 3.9   < > 4.5 4.5  CL 110  --  107  --   --   --   CO2 22  --  23  --   --   --   GLUCOSE 141*  --  146*  --   --   --   BUN 12  --  14  --   --   --   CREATININE 1.27*  --  1.23  --   --   --   CALCIUM 8.7*  --  8.7*  --   --   --    < > = values in this interval not displayed.    CMET: Lab Results  Component Value Date   WBC 7.3 09/27/2019   HGB 6.8 (LL) 09/27/2019   HCT 20.0 (L) 09/27/2019   PLT 92 (L) 09/27/2019   GLUCOSE 146 (H) 09/26/2019   CHOL 132 04/02/2019   TRIG 123.0 04/02/2019   HDL 35.00 (L) 04/02/2019   LDLDIRECT 50.0 10/02/2018   LDLCALC 73 04/02/2019   ALT 25 09/26/2019   AST 37 09/26/2019   NA 145 09/27/2019   K 4.5 09/27/2019   CL 107 09/26/2019   CREATININE 1.23 09/26/2019   BUN 14 09/26/2019   CO2 23 09/26/2019   TSH 2.673 09/08/2019   PSA 1.04 10/02/2018   INR 1.0 09/27/2019   HGBA1C 7.3 (H) 09/26/2019   MICROALBUR 3.6 (H) 08/31/2017      PT/INR:  Recent Labs    09/27/19 0140  LABPROT 12.4  INR 1.0   Radiology: DG CHEST PORT 1 VIEW  Result Date: 09/27/2019 CLINICAL DATA:  54 year old male with chest tube placement. EXAM: PORTABLE CHEST 1 VIEW COMPARISON:  Chest radiograph dated 09/25/2019. FINDINGS: Endotracheal tube with tip approximately 5 cm above the carina. Right IJ Swan-Ganz catheter with tip likely at the bifurcation of the main pulmonary trunk, inferiorly accessed mediastinal drain, and left chest tube. Status post open heart surgery. There is cardiomegaly. Mild diffuse hazy airspace densities may represent atelectasis or mild edema. No focal consolidation, pleural effusion, pneumothorax. IMPRESSION: 1. Endotracheal  tube above the carina. Additional support apparatus as above. 2. Postsurgical changes of open heart surgery. Electronically  Signed   By: Anner Crete M.D.   On: 09/27/2019 02:21     Assessment/Plan: S/P Procedure(s) (LRB): REDO STERNOTOMY (N/A) REDO ASCENDING AORTIC ROOT REPLACEMENT USING LIFENET 25 MM CRYO, AORTIC VALVE HOMOGRAFT AND 30 MM HEMASHIELD PLATINUM GRAFT (N/A) CORONARY ARTERY BYPASS GRAFTING (CABG), ON PUMP, TIMES THREE, USING ENDOSCOPICALLY HARVESTED LEFT GREATER SAPHENOUS VEIN (N/A) TRANSESOPHAGEAL ECHOCARDIOGRAM (TEE) (N/A)   1. CV-AV paced. CO/CI 5.8/2.3. On Dopamine 2 mcg/kg/min, Epinephrine 2 mcg/min, Nor epinephrine 2 mcg/min drips. 2. Pulmonary-Intubated/sedated. Chest tubes with 310 cc since arrival to PACU earlier this am. 3. DM-CBGs 92/145/139. On Insulin drip. Pre op HGA1C 7.3. 4. Blood loss anemia-H and H earlier this am 6.8 and 20 and transfusion ordered. Await the rest of this am's labs.   Donielle Liston Alba PA-C 09/27/2019 7:35 AM    Chart reviewed, patient examined, agree with above. He has been hemodynamically stable in sinus rhythm.  Extubated this am.  Labs this am shows Hgb 7.9. PA pressures low so I think he will benefit from transfusion of 1 unit PRBC's.  Discussed status with wife at bedside.

## 2019-09-27 NOTE — Procedures (Signed)
Extubation Procedure Note  Patient Details:   Name: Jobie Ramani DOB: [birth year] MRN: [id number]   Airway Documentation:    Vent end date: 09/27/19 Vent end time: 1150   Evaluation  O2 sats: stable throughout Complications: No apparent complications Patient did tolerate procedure well. Bilateral Breath Sounds: Clear, Diminished   Yes   Pt extubated per rapid wean protocol. Pt suctioned via ETT and orally prior. Pt extubated to 3L nasal cannula with humidity. Pt able to speak name, give a good cough and no stridor heard at this time. RT will continue to monitor.   Sharla Kidney 09/27/2019, 12:09 PM

## 2019-09-28 ENCOUNTER — Inpatient Hospital Stay (HOSPITAL_COMMUNITY): Payer: Medicare PPO

## 2019-09-28 DIAGNOSIS — I5042 Chronic combined systolic (congestive) and diastolic (congestive) heart failure: Secondary | ICD-10-CM

## 2019-09-28 LAB — POCT I-STAT 7, (LYTES, BLD GAS, ICA,H+H)
Acid-base deficit: 3 mmol/L — ABNORMAL HIGH (ref 0.0–2.0)
Acid-base deficit: 4 mmol/L — ABNORMAL HIGH (ref 0.0–2.0)
Acid-base deficit: 5 mmol/L — ABNORMAL HIGH (ref 0.0–2.0)
Bicarbonate: 22.1 mmol/L (ref 20.0–28.0)
Bicarbonate: 22.1 mmol/L (ref 20.0–28.0)
Bicarbonate: 21.4 mmol/L (ref 20.0–28.0)
Calcium, Ion: 1.19 mmol/L (ref 1.15–1.40)
Calcium, Ion: 1.21 mmol/L (ref 1.15–1.40)
Calcium, Ion: 1.21 mmol/L (ref 1.15–1.40)
HCT: 23 % — ABNORMAL LOW (ref 39.0–52.0)
HCT: 25 % — ABNORMAL LOW (ref 39.0–52.0)
HCT: 22 % — ABNORMAL LOW (ref 39.0–52.0)
Hemoglobin: 7.8 g/dL — ABNORMAL LOW (ref 13.0–17.0)
Hemoglobin: 8.5 g/dL — ABNORMAL LOW (ref 13.0–17.0)
Hemoglobin: 7.5 g/dL — ABNORMAL LOW (ref 13.0–17.0)
O2 Saturation: 97 %
O2 Saturation: 93 %
O2 Saturation: 93 %
Patient temperature: 37.4
Patient temperature: 98.2
Patient temperature: 98.9
Potassium: 4.5 mmol/L (ref 3.5–5.1)
Potassium: 5 mmol/L (ref 3.5–5.1)
Potassium: 5 mmol/L (ref 3.5–5.1)
Sodium: 144 mmol/L (ref 135–145)
Sodium: 143 mmol/L (ref 135–145)
Sodium: 142 mmol/L (ref 135–145)
TCO2: 23 mmol/L (ref 22–32)
TCO2: 23 mmol/L (ref 22–32)
TCO2: 23 mmol/L (ref 22–32)
pCO2 arterial: 38.6 mmHg (ref 32.0–48.0)
pCO2 arterial: 42 mmHg (ref 32.0–48.0)
pCO2 arterial: 44.5 mmHg (ref 32.0–48.0)
pH, Arterial: 7.367 (ref 7.350–7.450)
pH, Arterial: 7.328 — ABNORMAL LOW (ref 7.350–7.450)
pH, Arterial: 7.292 — ABNORMAL LOW (ref 7.350–7.450)
pO2, Arterial: 95 mmHg (ref 83.0–108.0)
pO2, Arterial: 71 mmHg — ABNORMAL LOW (ref 83.0–108.0)
pO2, Arterial: 77 mmHg — ABNORMAL LOW (ref 83.0–108.0)

## 2019-09-28 LAB — CBC
HCT: 26.6 % — ABNORMAL LOW (ref 39.0–52.0)
HCT: 24.8 % — ABNORMAL LOW (ref 39.0–52.0)
Hemoglobin: 8.9 g/dL — ABNORMAL LOW (ref 13.0–17.0)
Hemoglobin: 8.3 g/dL — ABNORMAL LOW (ref 13.0–17.0)
MCH: 28.8 pg (ref 26.0–34.0)
MCH: 29.5 pg (ref 26.0–34.0)
MCHC: 33.5 g/dL (ref 30.0–36.0)
MCHC: 33.5 g/dL (ref 30.0–36.0)
MCV: 86.1 fL (ref 80.0–100.0)
MCV: 88.3 fL (ref 80.0–100.0)
Platelets: 84 10*3/uL — ABNORMAL LOW (ref 150–400)
Platelets: 76 10*3/uL — ABNORMAL LOW (ref 150–400)
RBC: 3.09 MIL/uL — ABNORMAL LOW (ref 4.22–5.81)
RBC: 2.81 MIL/uL — ABNORMAL LOW (ref 4.22–5.81)
RDW: 16.8 % — ABNORMAL HIGH (ref 11.5–15.5)
RDW: 17 % — ABNORMAL HIGH (ref 11.5–15.5)
WBC: 15.5 10*3/uL — ABNORMAL HIGH (ref 4.0–10.5)
WBC: 13.1 10*3/uL — ABNORMAL HIGH (ref 4.0–10.5)
nRBC: 0 % (ref 0.0–0.2)
nRBC: 0 % (ref 0.0–0.2)

## 2019-09-28 LAB — COMPREHENSIVE METABOLIC PANEL
ALT: 17 U/L (ref 0–44)
AST: 40 U/L (ref 15–41)
Albumin: 2.6 g/dL — ABNORMAL LOW (ref 3.5–5.0)
Alkaline Phosphatase: 42 U/L (ref 38–126)
Anion gap: 12 (ref 5–15)
BUN: 27 mg/dL — ABNORMAL HIGH (ref 6–20)
CO2: 21 mmol/L — ABNORMAL LOW (ref 22–32)
Calcium: 8.4 mg/dL — ABNORMAL LOW (ref 8.9–10.3)
Chloride: 110 mmol/L (ref 98–111)
Creatinine, Ser: 3.58 mg/dL — ABNORMAL HIGH (ref 0.61–1.24)
GFR calc Af Amer: 21 mL/min — ABNORMAL LOW (ref >60)
GFR calc non Af Amer: 18 mL/min — ABNORMAL LOW (ref >60)
Glucose, Bld: 158 mg/dL — ABNORMAL HIGH (ref 70–99)
Potassium: 5 mmol/L (ref 3.5–5.1)
Sodium: 143 mmol/L (ref 135–145)
Total Bilirubin: 2.7 mg/dL — ABNORMAL HIGH (ref 0.3–1.2)
Total Protein: 5.4 g/dL — ABNORMAL LOW (ref 6.5–8.1)

## 2019-09-28 LAB — BPAM RBC
Blood Product Expiration Date: 202105272359
Blood Product Expiration Date: 202105282359
Blood Product Expiration Date: 202105292359
Blood Product Expiration Date: 202105292359
Blood Product Expiration Date: 202105292359
Blood Product Expiration Date: 202105292359
Blood Product Expiration Date: 202105292359
ISSUE DATE / TIME: 202105060728
ISSUE DATE / TIME: 202105060728
ISSUE DATE / TIME: 202105060728
ISSUE DATE / TIME: 202105060728
ISSUE DATE / TIME: 202105070149
ISSUE DATE / TIME: 202105070306
ISSUE DATE / TIME: 202105071259
Unit Type and Rh: 6200
Unit Type and Rh: 6200
Unit Type and Rh: 6200
Unit Type and Rh: 6200
Unit Type and Rh: 6200
Unit Type and Rh: 6200
Unit Type and Rh: 6200

## 2019-09-28 LAB — GLUCOSE, CAPILLARY
Glucose-Capillary: 117 mg/dL — ABNORMAL HIGH (ref 70–99)
Glucose-Capillary: 124 mg/dL — ABNORMAL HIGH (ref 70–99)
Glucose-Capillary: 133 mg/dL — ABNORMAL HIGH (ref 70–99)
Glucose-Capillary: 133 mg/dL — ABNORMAL HIGH (ref 70–99)
Glucose-Capillary: 134 mg/dL — ABNORMAL HIGH (ref 70–99)
Glucose-Capillary: 138 mg/dL — ABNORMAL HIGH (ref 70–99)
Glucose-Capillary: 142 mg/dL — ABNORMAL HIGH (ref 70–99)
Glucose-Capillary: 146 mg/dL — ABNORMAL HIGH (ref 70–99)
Glucose-Capillary: 147 mg/dL — ABNORMAL HIGH (ref 70–99)
Glucose-Capillary: 147 mg/dL — ABNORMAL HIGH (ref 70–99)
Glucose-Capillary: 154 mg/dL — ABNORMAL HIGH (ref 70–99)
Glucose-Capillary: 156 mg/dL — ABNORMAL HIGH (ref 70–99)
Glucose-Capillary: 161 mg/dL — ABNORMAL HIGH (ref 70–99)
Glucose-Capillary: 165 mg/dL — ABNORMAL HIGH (ref 70–99)
Glucose-Capillary: 165 mg/dL — ABNORMAL HIGH (ref 70–99)
Glucose-Capillary: 173 mg/dL — ABNORMAL HIGH (ref 70–99)
Glucose-Capillary: 176 mg/dL — ABNORMAL HIGH (ref 70–99)
Glucose-Capillary: 186 mg/dL — ABNORMAL HIGH (ref 70–99)
Glucose-Capillary: 188 mg/dL — ABNORMAL HIGH (ref 70–99)

## 2019-09-28 LAB — TYPE AND SCREEN
ABO/RH(D): A POS
Antibody Screen: NEGATIVE
Unit division: 0
Unit division: 0
Unit division: 0
Unit division: 0
Unit division: 0
Unit division: 0
Unit division: 0

## 2019-09-28 LAB — BASIC METABOLIC PANEL
Anion gap: 14 (ref 5–15)
BUN: 34 mg/dL — ABNORMAL HIGH (ref 6–20)
CO2: 20 mmol/L — ABNORMAL LOW (ref 22–32)
Calcium: 8.1 mg/dL — ABNORMAL LOW (ref 8.9–10.3)
Chloride: 107 mmol/L (ref 98–111)
Creatinine, Ser: 4.26 mg/dL — ABNORMAL HIGH (ref 0.61–1.24)
GFR calc Af Amer: 17 mL/min — ABNORMAL LOW (ref >60)
GFR calc non Af Amer: 15 mL/min — ABNORMAL LOW (ref >60)
Glucose, Bld: 183 mg/dL — ABNORMAL HIGH (ref 70–99)
Potassium: 5.2 mmol/L — ABNORMAL HIGH (ref 3.5–5.1)
Sodium: 141 mmol/L (ref 135–145)

## 2019-09-28 LAB — MAGNESIUM: Magnesium: 2.2 mg/dL (ref 1.7–2.4)

## 2019-09-28 LAB — COOXEMETRY PANEL
Carboxyhemoglobin: 1.3 % (ref 0.5–1.5)
Carboxyhemoglobin: 1.9 % — ABNORMAL HIGH (ref 0.5–1.5)
Methemoglobin: 1.1 % (ref 0.0–1.5)
Methemoglobin: 1.6 % — ABNORMAL HIGH (ref 0.0–1.5)
O2 Saturation: 96.4 %
O2 Saturation: 75.7 %
Total hemoglobin: 9.5 g/dL — ABNORMAL LOW (ref 12.0–16.0)
Total hemoglobin: 8.9 g/dL — ABNORMAL LOW (ref 12.0–16.0)

## 2019-09-28 LAB — GENTAMICIN LEVEL, RANDOM: Gentamicin Rm: 6 ug/mL

## 2019-09-28 LAB — LACTIC ACID, PLASMA: Lactic Acid, Venous: 1.5 mmol/L (ref 0.5–1.9)

## 2019-09-28 IMAGING — DX DG CHEST 1V PORT
1 series · 1 of 1 positions shown · non-contrast
Comparison: September 27, 2019

CLINICAL DATA: Postoperative median sternotomy with aortic root
replacement

EXAM:
PORTABLE CHEST 1 VIEW

[chest ap]
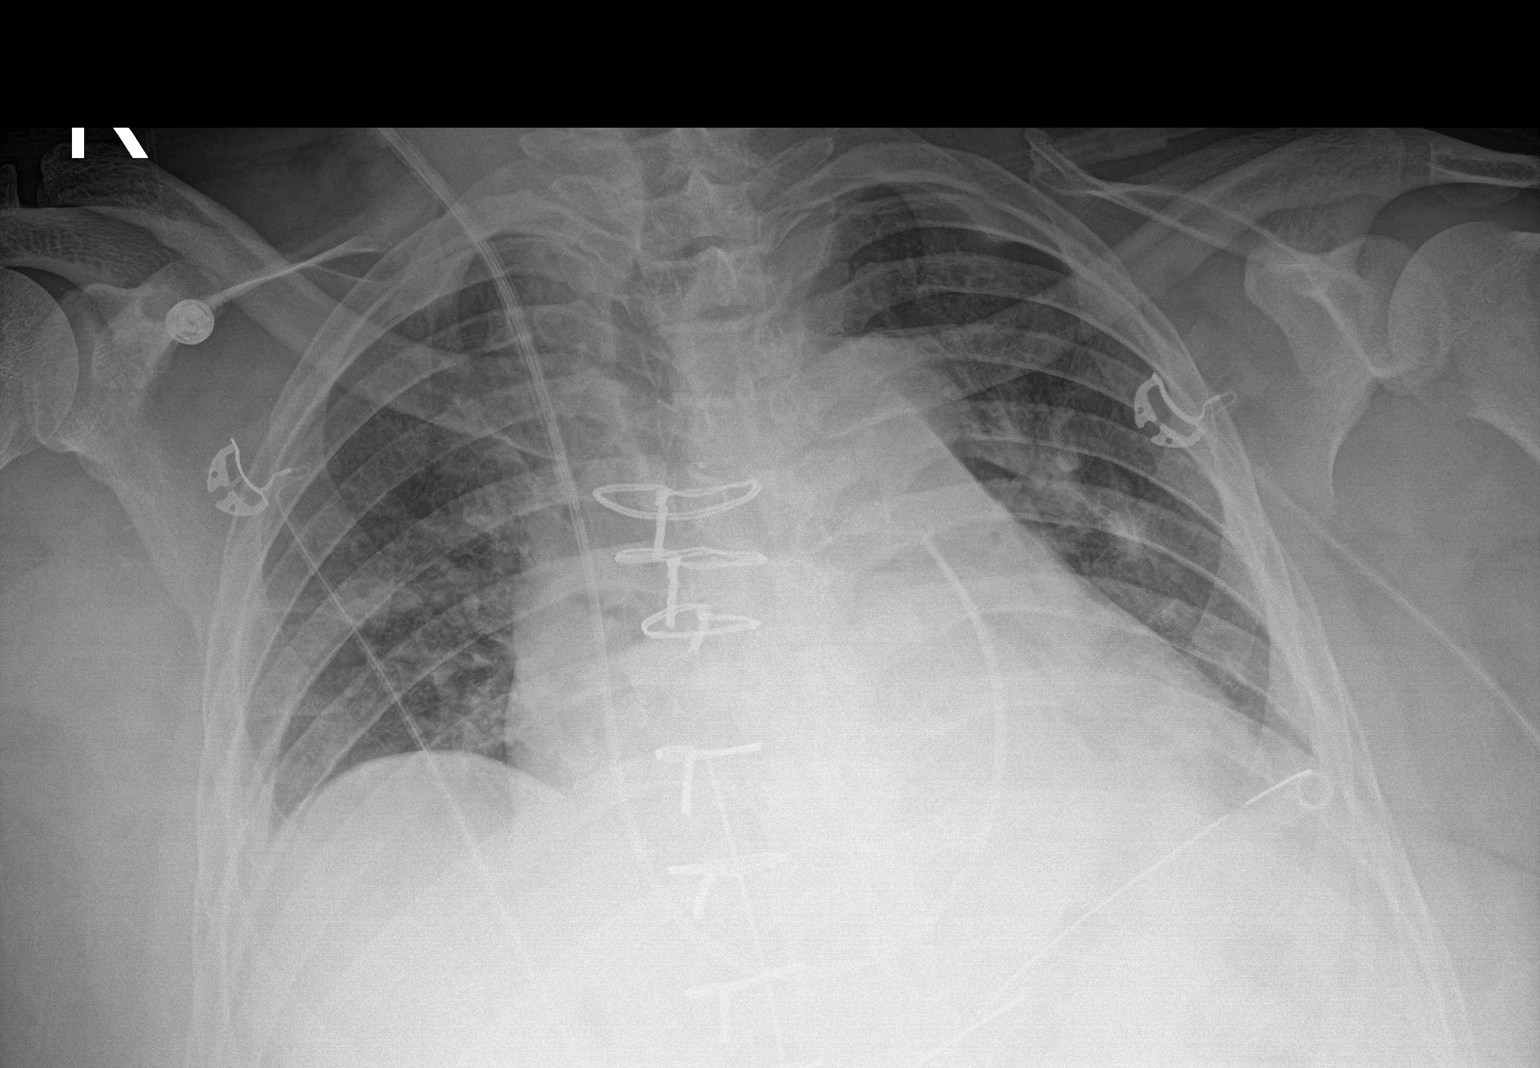

[1 of 1 positions shown; findings below may reference images not displayed]

FINDINGS: Endotracheal tube has been removed. Swan-Ganz catheter tip is in the
main pulmonary outflow tract. There is a left chest tube and a
mediastinal drain. No pneumothorax. There is persistent airspace
consolidation in the left lower lobe. No new opacity evident. Heart
is mildly enlarged with pulmonary vascularity, stable. No adenopathy
evident. No focal bone lesions are appreciable.
IMPRESSION: Endotracheal tube removed. Other tube and catheter positions as
described. No pneumothorax. Persistent cardiac prominence.
Persistent left lower lobe airspace consolidation. No new opacity.

## 2019-09-28 MED ORDER — SODIUM BICARBONATE 8.4 % IV SOLN
50.0000 meq | Freq: Once | INTRAVENOUS | Status: AC
  Administered 2019-09-28: 50 meq via INTRAVENOUS
  Filled 2019-09-28: qty 50

## 2019-09-28 MED ORDER — AMIODARONE HCL IN DEXTROSE 360-4.14 MG/200ML-% IV SOLN
30.0000 mg/h | INTRAVENOUS | Status: DC
  Administered 2019-09-29 (×2): 30 mg/h via INTRAVENOUS

## 2019-09-28 MED ORDER — AMIODARONE HCL IN DEXTROSE 360-4.14 MG/200ML-% IV SOLN
INTRAVENOUS | Status: AC
  Filled 2019-09-28: qty 200

## 2019-09-28 MED ORDER — GENTAMICIN VARIABLE DOSE PER UNSTABLE RENAL FUNCTION (PHARMACIST DOSING): Status: DC

## 2019-09-28 MED ORDER — AMIODARONE LOAD VIA INFUSION
150.0000 mg | Freq: Once | INTRAVENOUS | Status: AC
  Administered 2019-09-28: 22:00:00 150 mg via INTRAVENOUS
  Filled 2019-09-28: qty 83.34

## 2019-09-28 MED ORDER — AMIODARONE HCL IN DEXTROSE 360-4.14 MG/200ML-% IV SOLN
60.0000 mg/h | INTRAVENOUS | Status: AC
  Administered 2019-09-28 – 2019-09-29 (×2): 60 mg/h via INTRAVENOUS

## 2019-09-28 NOTE — Progress Notes (Signed)
Pharmacy Antibiotic Note  [name] is a 54 y.o. 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 Pharmacy has been consulted for gentamicin dosing for synergy.  Pt has been on ceftriaxone and gent for his native and prosthetic valve endocarditis with strep infantarius. Gentamicin was last adjusted on 4/29.   Patient will need 6 weeks of ceftriaxone + gentamicin with  I susceptibility to penicillin of strep infantarius.   Patient did not receive gentamicin on day of surgery 5/6, rescheduled dose for yesterday morning 5/7. Patient has since continues to suffer from post op AKI. Scr has now trended to 3.58, minimal uop of 335ml. Checked a gent random level this morning 4 hours from scheduled dose which resulted elevated at 6 with a targeted trough of <1.   Will hold further gent dosing, will consider repeating level in the next 24-48 hours depending on if CRRT is started.   No adjustments are needed to ceftriaxone dosing.   Plan: Continue gentamicin at 220mg  IV q24 Continue ceftriaxone 2 gm every 24 hours Monitor gentamicin levels as indicated (Goal <1)    Height: 6\' 2"  (188 cm) Weight: (!) 137.6 kg (303 lb 5.7 oz) IBW/kg (Calculated) : 82.2  Temp (24hrs), Avg:98.8 F (37.1 C), Min:97.9 F (36.6 C), Max:99.5 F (37.5 C)  Recent Labs  Lab 09/26/19 0458 09/26/19 0758 09/26/19 2235 09/26/19 2356 09/27/19 0140 09/27/19 0730 09/27/19 1700 09/28/19 0430  WBC 5.3  --   --   --  7.3 9.1 14.4* 15.5*  CREATININE 1.23   < > 1.30* 1.30*  --  1.88* 2.60* 3.58*   < > = values in this interval not displayed.    Estimated Creatinine Clearance: 34.8 mL/min (A) (by C-G formula based on SCr of 3.58 mg/dL (H)).    Allergies  Allergen Reactions  . Diltiazem Hives  . Insulin Glargine Swelling    edema    Antimicrobials this admission: 4/19 Vanc>> 4/21 4/19 ceftriaxone >> 4/21 gentamicin>>  Microbiology  results: 4/19 BCx: GPCs in clusters 2/2>>STREPTOCOCCUS INFANTARIUS  4/20: bld x2 - ngtdF  Erin Hearing PharmD., BCPS Clinical Pharmacist 09/28/2019 8:05 AM   Please check AMION for all [facility] phone numbers After 10:00 PM, call [facility] [phone]

## 2019-09-28 NOTE — Progress Notes (Signed)
EVENING ROUNDS NOTE :     [address]       [place] [address]             [phone]                 2 Days Post-Op Procedure(s) (LRB): REDO STERNOTOMY (N/A) REDO ASCENDING AORTIC ROOT REPLACEMENT USING LIFENET 25 MM CRYO, AORTIC VALVE HOMOGRAFT AND 30 MM HEMASHIELD PLATINUM GRAFT (N/A) CORONARY ARTERY BYPASS GRAFTING (CABG), ON PUMP, TIMES THREE, USING ENDOSCOPICALLY HARVESTED LEFT GREATER SAPHENOUS VEIN (N/A) TRANSESOPHAGEAL ECHOCARDIOGRAM (TEE) (N/A)  Total Length of Stay:  LOS: 20 days  BP 124/61   Pulse 88   Temp 99 F (37.2 C)   Resp (!) 24   Ht 6\' 2"  (1.88 m)   Wt (!) 137.6 kg   SpO2 97%   BMI 38.95 kg/m   .Intake/Output      05/08 0701 - 05/09 0700   P.O. 110   I.V. (mL/kg) 632.5 (4.6)   Blood    IV Piggyback 100   Total Intake(mL/kg) 842.5 (6.1)   Urine (mL/kg/hr) 170 (0.1)   Emesis/NG output    Chest Tube 80   Total Output 250   Net +592.5         . sodium chloride Stopped (09/27/19 1005)  . sodium chloride 250 mL (09/27/19 1731)  . sodium chloride 20 mL/hr at 09/27/19 0200  . cefTRIAXone (ROCEPHIN)  IV Stopped (09/28/19 1140)  . dexmedetomidine (PRECEDEX) IV infusion Stopped (09/27/19 1153)  . DOPamine 2 mcg/kg/min (09/28/19 1900)  . epinephrine 2 mcg/min (09/28/19 1900)  . insulin 3 mL/hr at 09/28/19 1900  . lactated ringers    . lactated ringers    . lactated ringers 20 mL/hr at 09/28/19 1900  . nitroGLYCERIN    . norepinephrine (LEVOPHED) Adult infusion 4 mcg/min (09/28/19 1900)  . phenylephrine (NEO-SYNEPHRINE) Adult infusion       Lab Results  Component Value Date   WBC 13.1 (H) 09/28/2019   HGB 8.3 (L) 09/28/2019   HCT 24.8 (L) 09/28/2019   PLT 76 (L) 09/28/2019   GLUCOSE 158 (H) 09/28/2019   CHOL 132 04/02/2019   TRIG 123.0 04/02/2019   HDL 35.00 (L) 04/02/2019   LDLDIRECT 50.0 10/02/2018   LDLCALC 73 04/02/2019   ALT 17 09/28/2019   AST 40 09/28/2019   NA 142 09/28/2019   K 5.0 09/28/2019   CL 110  09/28/2019   CREATININE 3.58 (H) 09/28/2019   BUN 27 (H) 09/28/2019   CO2 21 (L) 09/28/2019   TSH 2.673 09/08/2019   PSA 1.04 10/02/2018   INR 1.0 09/27/2019   HGBA1C 7.3 (H) 09/26/2019   MICROALBUR 3.6 (H) 08/31/2017   uop 170 ml 12 hours Cr  3.58 this am  Cr now 4.26 k =m 5.0 hgb stable Ph 7.29  Grace Isaac MD  Beeper [phone] Office [phone] 09/28/2019 7:30 PM

## 2019-09-28 NOTE — Progress Notes (Signed)
Patient ID: [name], male   DOB: [birth year], 54 y.o.   MRN: [id number] [facility] DAILY ICU PROGRESS NOTE                   [address]            [place] [address]          [phone]   2 Days Post-Op Procedure(s) (LRB): REDO STERNOTOMY (N/A) REDO ASCENDING AORTIC ROOT REPLACEMENT USING LIFENET 25 MM CRYO, AORTIC VALVE HOMOGRAFT AND 30 MM HEMASHIELD PLATINUM GRAFT (N/A) CORONARY ARTERY BYPASS GRAFTING (CABG), ON PUMP, TIMES THREE, USING ENDOSCOPICALLY HARVESTED LEFT GREATER SAPHENOUS VEIN (N/A) TRANSESOPHAGEAL ECHOCARDIOGRAM (TEE) (N/A)  Total Length of Stay:  LOS: 20 days   Subjective: Patient opens eyes follows commands but not very talkative, urine output decreased  Objective: Vital signs in last 24 hours: Temp:  [97.9 F (36.6 C)-99.5 F (37.5 C)] 98.2 F (36.8 C) (05/08 0800) Pulse Rate:  [88-108] 103 (05/08 0800) Cardiac Rhythm: Atrial paced (05/08 0400) Resp:  [18-45] 39 (05/08 0800) BP: (84-134)/(50-73) 134/62 (05/08 0800) SpO2:  [92 %-100 %] 93 % (05/08 0800) FiO2 (%):  [40 %] 40 % (05/07 1100) Weight:  [137.6 kg] 137.6 kg (05/08 0600)  Filed Weights   09/25/19 0659 09/26/19 0430 09/28/19 0600  Weight: 130.9 kg 132 kg (!) 137.6 kg    Weight change:    Hemodynamic parameters for last 24 hours: PAP: (22-44)/(6-27) 36/16 CO:  [5.5 L/min-8.3 L/min] 8.2 L/min CI:  [2.2 L/min/m2-3.2 L/min/m2] 3.2 L/min/m2  Intake/Output from previous day: 05/07 0701 - 05/08 0700 In: 1946.9 [I.V.:1476.2; Blood:252; IV Piggyback:218.7] Out: 778 [Urine:388; Chest Tube:390]  Intake/Output this shift: Total I/O In: 57.1 [I.V.:57.1] Out: 56 [Urine:26; Chest Tube:30]  Current Meds: Scheduled Meds: . sodium chloride   Intravenous Once  . sodium chloride   Intravenous Once  . acetaminophen  1,000 mg Oral Q6H   Or  . acetaminophen (TYLENOL) oral liquid 160 mg/5 mL  1,000 mg Per Tube Q6H  . aspirin EC  325 mg Oral Daily   Or  . aspirin  324 mg Per Tube  Daily  . atorvastatin  40 mg Oral Q breakfast  . bisacodyl  10 mg Oral Daily   Or  . bisacodyl  10 mg Rectal Daily  . Chlorhexidine Gluconate Cloth  6 each Topical Daily  . colchicine  0.6 mg Oral BID  . docusate sodium  200 mg Oral Daily  . feeding supplement  1 Container Oral BID BM  . feeding supplement (ENSURE ENLIVE)  237 mL Oral BID BM  . feeding supplement (PRO-STAT SUGAR FREE 64)  30 mL Oral BID  . gabapentin  300 mg Oral Q breakfast  . levothyroxine  50 mcg Oral Q0600  . multivitamin with minerals  1 tablet Oral Daily  . pantoprazole  40 mg Oral Daily  . sodium chloride flush  3 mL Intravenous Q12H   Continuous Infusions: . sodium chloride Stopped (09/27/19 1005)  . sodium chloride 250 mL (09/27/19 1731)  . sodium chloride 20 mL/hr at 09/27/19 0200  . cefTRIAXone (ROCEPHIN)  IV Stopped (09/27/19 1035)  . dexmedetomidine (PRECEDEX) IV infusion Stopped (09/27/19 1153)  . DOPamine 2 mcg/kg/min (09/28/19 0800)  . epinephrine 2 mcg/min (09/28/19 0800)  . insulin 0.8 mL/hr at 09/28/19 0800  . lactated ringers    . lactated ringers    . lactated ringers 20 mL/hr at 09/28/19 0800  . nitroGLYCERIN    . norepinephrine (LEVOPHED)  Adult infusion 6 mcg/min (09/28/19 0800)  . phenylephrine (NEO-SYNEPHRINE) Adult infusion     PRN Meds:.sodium chloride, dextrose, lactated ringers, metoprolol tartrate, midazolam, morphine injection, ondansetron (ZOFRAN) IV, oxyCODONE, sodium chloride flush, traMADol  General appearance: distracted and slowed mentation Neurologic: Neuro grossly intact does follow commands and moves all extremities appropriately to command Heart: regular rate and rhythm, S1, S2 normal, no murmur, click, rub or gallop Lungs: diminished breath sounds bibasilar Abdomen: soft, non-tender; bowel sounds normal; no masses,  no organomegaly Extremities: extremities normal, atraumatic, no cyanosis or edema and Homans sign is negative, no sign of DVT Wound: Sternum intact  stable  Lab Results: CBC: Recent Labs    09/27/19 1700 09/28/19 0430  WBC 14.4* 15.5*  HGB 9.1* 8.9*  HCT 26.6* 26.6*  PLT 83* 84*   BMET:  Recent Labs    09/27/19 1700 09/28/19 0430  NA 142 143  K 4.8 5.0  CL 109 110  CO2 20* 21*  GLUCOSE 163* 158*  BUN 22* 27*  CREATININE 2.60* 3.58*  CALCIUM 8.1* 8.4*    CMET: Lab Results  Component Value Date   WBC 15.5 (H) 09/28/2019   HGB 8.9 (L) 09/28/2019   HCT 26.6 (L) 09/28/2019   PLT 84 (L) 09/28/2019   GLUCOSE 158 (H) 09/28/2019   CHOL 132 04/02/2019   TRIG 123.0 04/02/2019   HDL 35.00 (L) 04/02/2019   LDLDIRECT 50.0 10/02/2018   LDLCALC 73 04/02/2019   ALT 17 09/28/2019   AST 40 09/28/2019   NA 143 09/28/2019   K 5.0 09/28/2019   CL 110 09/28/2019   CREATININE 3.58 (H) 09/28/2019   BUN 27 (H) 09/28/2019   CO2 21 (L) 09/28/2019   TSH 2.673 09/08/2019   PSA 1.04 10/02/2018   INR 1.0 09/27/2019   HGBA1C 7.3 (H) 09/26/2019   MICROALBUR 3.6 (H) 08/31/2017      PT/INR:  Recent Labs    09/27/19 0140  LABPROT 12.4  INR 1.0   Radiology: DG CHEST PORT 1 VIEW  Result Date: 09/28/2019 CLINICAL DATA:  Postoperative median sternotomy with aortic root replacement EXAM: PORTABLE CHEST 1 VIEW COMPARISON:  Sep 27, 2019 FINDINGS: Endotracheal tube has been removed. Swan-Ganz catheter tip is in the main pulmonary outflow tract. There is a left chest tube and a mediastinal drain. No pneumothorax. There is persistent airspace consolidation in the left lower lobe. No new opacity evident. Heart is mildly enlarged with pulmonary vascularity, stable. No adenopathy evident. No focal bone lesions are appreciable. IMPRESSION: Endotracheal tube removed. Other tube and catheter positions as described. No pneumothorax. Persistent cardiac prominence. Persistent left lower lobe airspace consolidation. No new opacity. Electronically Signed   By: Lowella Grip III M.D.   On: 09/28/2019 08:34   Acute Kidney Injury (any one)  Increase in  SCr by > 0.3 within 48 hours  Increase SCr to > 1.5 times baseline  Urine volume < 0.5 ml/kg/h for 6 hrs  ?Stage 1 - Increase in serum creatinine to 1.5 to 1.9 times baseline, or increase in serum creatinine by ?0.3 mg/dL (?26.5 micromol/L), or reduction in urine output to <0.5 mL/kg per hour for 6 to 12 hours.  ?Stage 2 - Increase in serum creatinine to 2.0 to 2.9 times baseline, or reduction in urine output to <0.5 mL/kg per hour for ?12 hours.  ?Stage 3 - Increase in serum creatinine to 3.0 times baseline, or increase in serum creatinine to ?4.0 mg/dL (?353.6 micromol/L), or reduction in urine output to <0.3 mL/kg  per hour for ?24 hours, or anuria for ?12 hours, or the initiation of renal replacement therapy, or, in patients <18 years, decrease in eGFR to <35 mL   Lab Results  Component Value Date   CREATININE 3.58 (H) 09/28/2019   Estimated Creatinine Clearance: 34.8 mL/min (A) (by C-G formula based on SCr of 3.58 mg/dL (H)).   Assessment/Plan: S/P Procedure(s) (LRB): REDO STERNOTOMY (N/A) REDO ASCENDING AORTIC ROOT REPLACEMENT USING LIFENET 25 MM CRYO, AORTIC VALVE HOMOGRAFT AND 30 MM HEMASHIELD PLATINUM GRAFT (N/A) CORONARY ARTERY BYPASS GRAFTING (CABG), ON PUMP, TIMES THREE, USING ENDOSCOPICALLY HARVESTED LEFT GREATER SAPHENOUS VEIN (N/A) TRANSESOPHAGEAL ECHOCARDIOGRAM (TEE) (N/A) Stage III acute kidney injury-continue dopamine, monitor urine output and creatinine, monitor potassium-  COOX this morning was drawn from A-line not from central line will be repeated Continues on 2 mics of epi 5 mics of Levophed and insulin drip Arterial blood gases satisfactory 390 from chest tubes 24 hours we will leave another day Continue Swan another 24 hours to monitor volume status-   Grace Isaac 09/28/2019 8:45 AM

## 2019-09-29 ENCOUNTER — Inpatient Hospital Stay (HOSPITAL_COMMUNITY): Payer: Medicare PPO

## 2019-09-29 LAB — COMPREHENSIVE METABOLIC PANEL
ALT: 17 U/L (ref 0–44)
AST: 31 U/L (ref 15–41)
Albumin: 2.2 g/dL — ABNORMAL LOW (ref 3.5–5.0)
Alkaline Phosphatase: 48 U/L (ref 38–126)
Anion gap: 13 (ref 5–15)
BUN: 37 mg/dL — ABNORMAL HIGH (ref 6–20)
CO2: 20 mmol/L — ABNORMAL LOW (ref 22–32)
Calcium: 8.1 mg/dL — ABNORMAL LOW (ref 8.9–10.3)
Chloride: 108 mmol/L (ref 98–111)
Creatinine, Ser: 4.46 mg/dL — ABNORMAL HIGH (ref 0.61–1.24)
GFR calc Af Amer: 16 mL/min — ABNORMAL LOW (ref >60)
GFR calc non Af Amer: 14 mL/min — ABNORMAL LOW (ref >60)
Glucose, Bld: 154 mg/dL — ABNORMAL HIGH (ref 70–99)
Potassium: 4.6 mmol/L (ref 3.5–5.1)
Sodium: 141 mmol/L (ref 135–145)
Total Bilirubin: 3.2 mg/dL — ABNORMAL HIGH (ref 0.3–1.2)
Total Protein: 5.1 g/dL — ABNORMAL LOW (ref 6.5–8.1)

## 2019-09-29 LAB — POCT I-STAT 7, (LYTES, BLD GAS, ICA,H+H)
Acid-base deficit: 5 mmol/L — ABNORMAL HIGH (ref 0.0–2.0)
Bicarbonate: 21.3 mmol/L (ref 20.0–28.0)
Calcium, Ion: 1.23 mmol/L (ref 1.15–1.40)
HCT: 23 % — ABNORMAL LOW (ref 39.0–52.0)
Hemoglobin: 7.8 g/dL — ABNORMAL LOW (ref 13.0–17.0)
O2 Saturation: 92 %
Patient temperature: 36.4
Potassium: 4.7 mmol/L (ref 3.5–5.1)
Sodium: 143 mmol/L (ref 135–145)
TCO2: 23 mmol/L (ref 22–32)
pCO2 arterial: 41.1 mmHg (ref 32.0–48.0)
pH, Arterial: 7.321 — ABNORMAL LOW (ref 7.350–7.450)
pO2, Arterial: 67 mmHg — ABNORMAL LOW (ref 83.0–108.0)

## 2019-09-29 LAB — CBC
HCT: 23.5 % — ABNORMAL LOW (ref 39.0–52.0)
Hemoglobin: 7.8 g/dL — ABNORMAL LOW (ref 13.0–17.0)
MCH: 28.9 pg (ref 26.0–34.0)
MCHC: 33.2 g/dL (ref 30.0–36.0)
MCV: 87 fL (ref 80.0–100.0)
Platelets: 73 10*3/uL — ABNORMAL LOW (ref 150–400)
RBC: 2.7 MIL/uL — ABNORMAL LOW (ref 4.22–5.81)
RDW: 17 % — ABNORMAL HIGH (ref 11.5–15.5)
WBC: 9.2 10*3/uL (ref 4.0–10.5)
nRBC: 0 % (ref 0.0–0.2)

## 2019-09-29 LAB — GLUCOSE, CAPILLARY
Glucose-Capillary: 121 mg/dL — ABNORMAL HIGH (ref 70–99)
Glucose-Capillary: 141 mg/dL — ABNORMAL HIGH (ref 70–99)
Glucose-Capillary: 122 mg/dL — ABNORMAL HIGH (ref 70–99)
Glucose-Capillary: 136 mg/dL — ABNORMAL HIGH (ref 70–99)
Glucose-Capillary: 115 mg/dL — ABNORMAL HIGH (ref 70–99)
Glucose-Capillary: 170 mg/dL — ABNORMAL HIGH (ref 70–99)
Glucose-Capillary: 148 mg/dL — ABNORMAL HIGH (ref 70–99)
Glucose-Capillary: 150 mg/dL — ABNORMAL HIGH (ref 70–99)
Glucose-Capillary: 129 mg/dL — ABNORMAL HIGH (ref 70–99)
Glucose-Capillary: 168 mg/dL — ABNORMAL HIGH (ref 70–99)

## 2019-09-29 LAB — COOXEMETRY PANEL
Carboxyhemoglobin: 1.1 % (ref 0.5–1.5)
Methemoglobin: 1 % (ref 0.0–1.5)
O2 Saturation: 73.2 %
Total hemoglobin: 9.9 g/dL — ABNORMAL LOW (ref 12.0–16.0)

## 2019-09-29 LAB — TSH: TSH: 0.977 u[IU]/mL (ref 0.350–4.500)

## 2019-09-29 IMAGING — DX DG CHEST 1V PORT
1 series · 1 of 1 positions shown · non-contrast
Comparison: September 28, 2019

CLINICAL DATA: Postoperative median sternotomy with concern for
endocarditis

EXAM:
PORTABLE CHEST 1 VIEW

[chest ap]
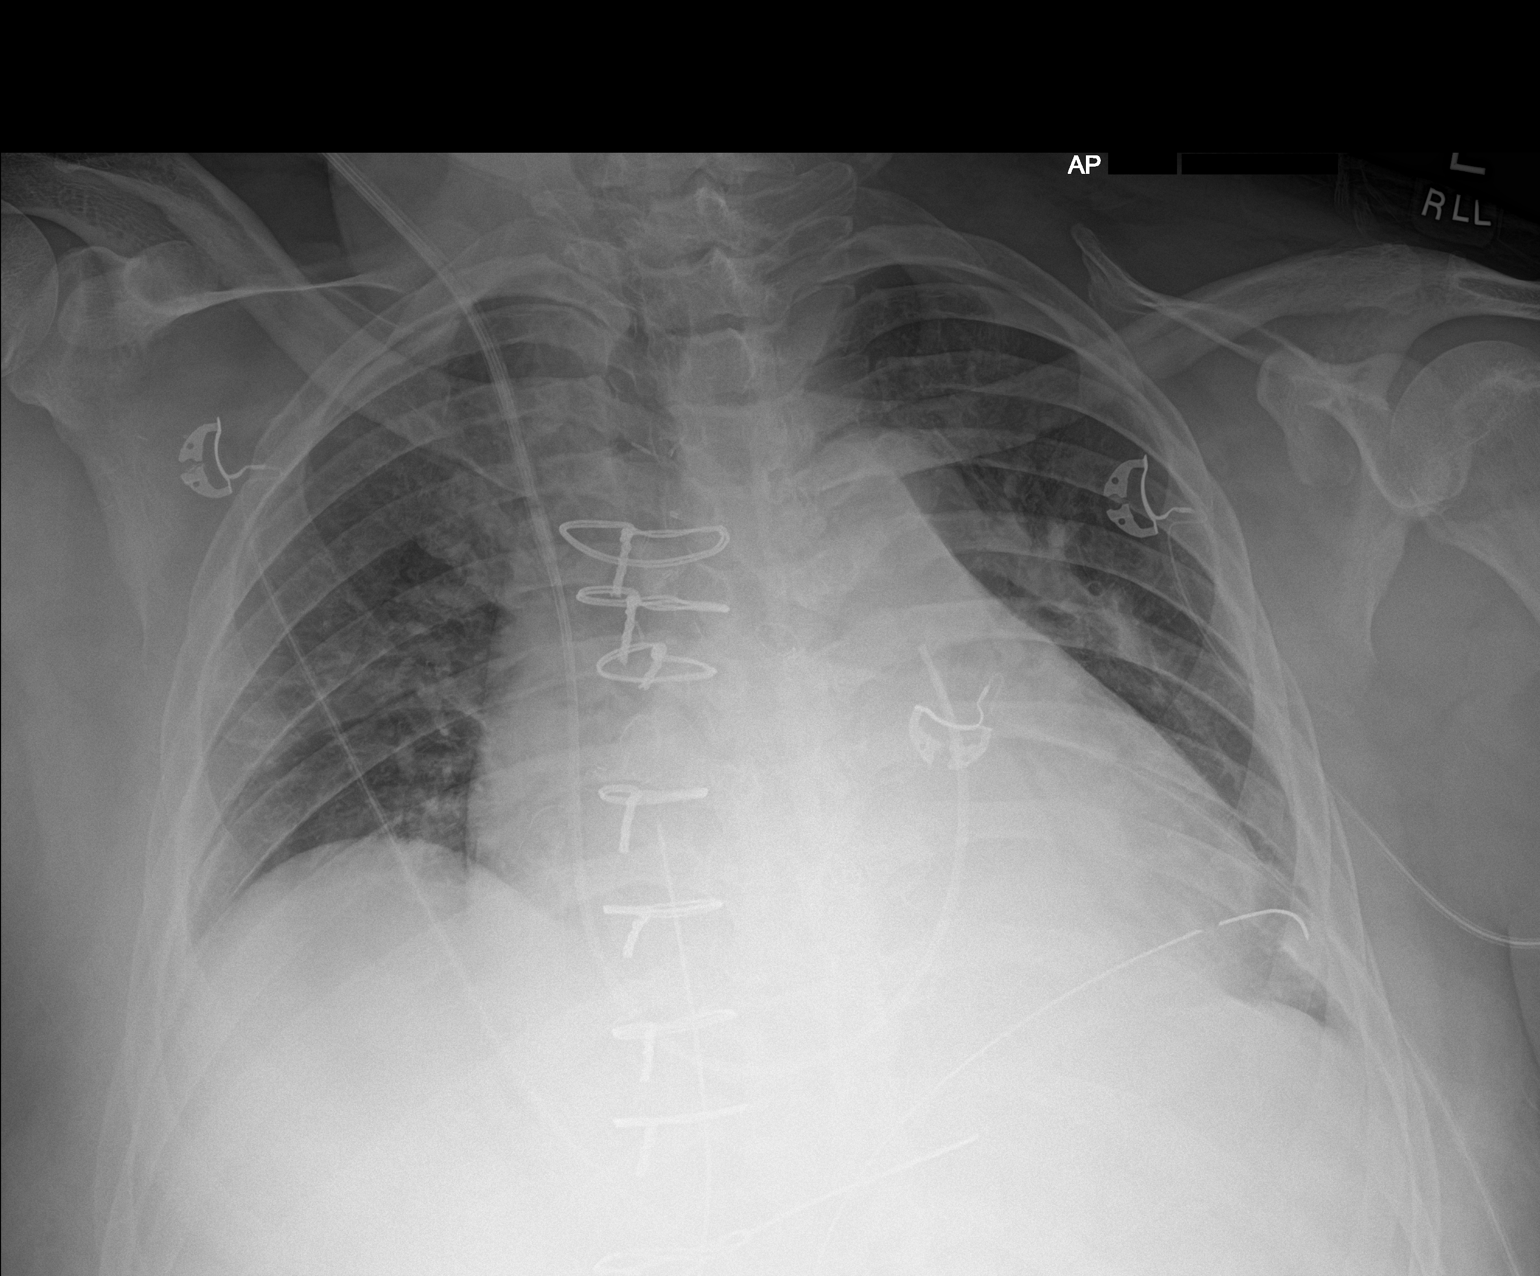

[1 of 1 positions shown; findings below may reference images not displayed]

FINDINGS: Swan-Ganz catheter tip is in the main pulmonary outflow tract. Left
chest tube and mediastinal drain present. No appreciable
pneumothorax. Airspace consolidation persists in the left lower
lobe. Lungs elsewhere clear. Heart is enlarged, stable, with
pulmonary vascularity normal. No adenopathy. No appreciable bone
lesions.
IMPRESSION: Tube and catheter positions unchanged without pneumothorax.
Persistent airspace opacity left lower lobe. No new opacity evident.
Stable cardiomegaly.

## 2019-09-29 MED ORDER — LEVALBUTEROL HCL 0.63 MG/3ML IN NEBU
0.6300 mg | INHALATION_SOLUTION | Freq: Four times a day (QID) | RESPIRATORY_TRACT | Status: DC | PRN
  Administered 2019-09-29 – 2019-10-04 (×2): 0.63 mg via RESPIRATORY_TRACT
  Filled 2019-09-29 (×2): qty 3

## 2019-09-29 MED ORDER — LEVALBUTEROL HCL 0.63 MG/3ML IN NEBU
INHALATION_SOLUTION | RESPIRATORY_TRACT | Status: AC
  Administered 2019-09-29: 0.63 mg via RESPIRATORY_TRACT
  Filled 2019-09-29: qty 3

## 2019-09-29 MED ORDER — FUROSEMIDE 10 MG/ML IJ SOLN
120.0000 mg | Freq: Once | INTRAVENOUS | Status: AC
  Administered 2019-09-29: 120 mg via INTRAVENOUS
  Filled 2019-09-29: qty 10

## 2019-09-29 MED ORDER — INSULIN ASPART 100 UNIT/ML ~~LOC~~ SOLN
0.0000 [IU] | SUBCUTANEOUS | Status: DC
  Administered 2019-09-29: 4 [IU] via SUBCUTANEOUS
  Administered 2019-09-29 – 2019-09-30 (×2): 2 [IU] via SUBCUTANEOUS
  Administered 2019-09-30: 4 [IU] via SUBCUTANEOUS
  Administered 2019-09-30 – 2019-10-01 (×4): 2 [IU] via SUBCUTANEOUS

## 2019-09-29 MED ORDER — ORAL CARE MOUTH RINSE
15.0000 mL | Freq: Two times a day (BID) | OROMUCOSAL | Status: DC
  Administered 2019-09-29 – 2019-10-05 (×9): 15 mL via OROMUCOSAL

## 2019-09-29 MED ORDER — INSULIN DETEMIR 100 UNIT/ML ~~LOC~~ SOLN: 20.0000 [IU] | Freq: Every day | SUBCUTANEOUS | Status: DC

## 2019-09-29 MED ORDER — INSULIN DETEMIR 100 UNIT/ML ~~LOC~~ SOLN
20.0000 [IU] | Freq: Every day | SUBCUTANEOUS | Status: DC
  Administered 2019-09-29 – 2019-10-03 (×5): 20 [IU] via SUBCUTANEOUS
  Filled 2019-09-29 (×7): qty 0.2

## 2019-09-29 NOTE — Consult Note (Signed)
[name] Admit Date: 09/08/2019 09/29/2019 [name] Requesting Physician:  [name]  Reason for Consult:  AKI HPI:  26M initially admitted on 09/08/2019 after presenting with nausea/vomiting/diarrhea.  Past medical history includes:    History of ascending aortic aneurysm status post repair in 2000 with mechanical aortic valve; one-vessel CABG at that time  DM2  Atrial fibrillation, on chronic anticoagulation with warfarin  Hypertension  Hyperlipidemia  History of TIA  Work-up eventually identified concern for tricuspid valve endocarditis and mechanical valve aortic endocarditis.  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  Cultures have shown Streptococcus species and he has been on a long term course of ceftriaxone and gentamicin.  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  Surgical procedure included debridement of abscess, closure of fistula, replacement of aortic root, three-vessel CABG.  Surgery was in excess of 9 hours and estimated blood loss was 3 L.  Creatinine was 1.1-1.3 in the.  Prior to his surgery and has subsequently increased to 4.5 today.  Urine output was 0.4 L 2 days ago, 0.5 L today.  Postoperatively he has been maintained on low doses of dopamine, epinephrine, norepinephrine.  Urine analysis prior to his surgery was bland without hematuria, pyuria, proteinuria.  CTA of the abdomen on 4/22 with normal-appearing kidneys and no hydronephrosis.  He is extubating, tolerating diet.  He has a Foley catheter.  2+ edema.  Weights are up 7 kg from his surgery, 10 kg from  presentation.   Creat (mg/dL)  Date Value  06/15/2016 1.14  04/13/2016 1.54 (H)   Creatinine, Ser (mg/dL)  Date Value  09/29/2019 4.46 (H)  09/28/2019 4.26 (H)  09/28/2019 3.58 (H)  09/27/2019 2.60 (H)  09/27/2019 1.88 (H)  09/26/2019 1.30 (H)  09/26/2019 1.30 (H)  09/26/2019 1.30 (H)  09/26/2019 1.20  09/26/2019 1.20  ] I/Os: I/O last 3 completed shifts: In: 2863 [P.O.:110; I.V.:2597.8; IV Piggyback:155.2] Out: 943 [Urine:653; Chest Tube:290]   ROS Balance of 12 systems is negative w/ exceptions as above  PMH  Past Medical History:  Diagnosis Date  . Ascending aortic dissection ([place])    a. 2000 - with aortic valve involvement. S/p repair of aortic dissection with placement of mechanical AVR.  [name] CAD (coronary artery disease)    a. 2000 VG->PDA @ time of Ao dissection repair;  b. Cardiac CT 06/2010: no CAD, only mild plaque in prox RCA;  c. 2015 Cath: LM nl, LAD nl, LCX nl, RCA 100, VG->PDA 90 (4.5x12 Rebel BMS). d. 10/23/17 instent restenosis SVG to RCA, DES placed.  . Cholelithiasis 08/22/2013  . Chronic diastolic CHF (congestive heart failure) ([place])    a. 03/2016 Echo: EF 55-60%, no rwma, Gr2 DD.  . Diabetes mellitus   . Dyspnea   . Gross hematuria   . H/O mechanical aortic valve replacement    a. 03/2016 Echo: AoV mean gradient 60mmHg, Valve Area (VTI) 1.36 cm^2, (Vmax) 1.22 cm^2, mod dil LA.  [name] Hyperlipidemia   . Hypertension   . NEPHROLITHIASIS, HX OF   . Obesity   . PAF (paroxysmal atrial fibrillation) ([place])    a. H/o such, with recurrence of coarse afib/flutter during ER consult 03/2012.  [name]  Stroke ([place])   . TRANSIENT ISCHEMIC ATTACK, HX OF    a. In 2004.  [name] Unspecified vitamin D deficiency    PSH  Past Surgical History:  Procedure Laterality Date  . AORTIC VALVE REPLACEMENT  2000  . APPENDECTOMY  1998  . ASCENDING AORTIC ROOT REPLACEMENT N/A 09/26/2019   Procedure: REDO ASCENDING AORTIC ROOT REPLACEMENT USING LIFENET 25 MM CRYO, AORTIC VALVE HOMOGRAFT AND  30 MM HEMASHIELD PLATINUM GRAFT;  Surgeon: Gaye Pollack, MD;  Location: [facility];  Service: Open Heart Surgery;  Laterality: N/A;  CIRC ARREST  . CORONARY ARTERY BYPASS GRAFT  2000  . CORONARY ARTERY BYPASS GRAFT N/A 09/26/2019   Procedure: CORONARY ARTERY BYPASS GRAFTING (CABG), ON PUMP, TIMES THREE, USING ENDOSCOPICALLY HARVESTED LEFT GREATER SAPHENOUS VEIN;  Surgeon: Gaye Pollack, MD;  Location: [facility];  Service: Open Heart Surgery;  Laterality: N/A;  WITH EVH  . CORONARY STENT INTERVENTION N/A 10/23/2017   Procedure: CORONARY STENT INTERVENTION;  Surgeon: [name], MD;  Location: [facility];  Service: Cardiovascular;  Laterality: N/A;  . IR FLUORO GUIDED NEEDLE PLC ASPIRATION/INJECTION LOC  09/18/2019  . LEFT AND RIGHT HEART CATHETERIZATION WITH CORONARY ANGIOGRAM N/A 07/19/2013   Procedure: LEFT AND RIGHT HEART CATHETERIZATION WITH CORONARY ANGIOGRAM;  Surgeon: [name], MD;  Location: [facility] CATH LAB;  Service: Cardiovascular;  Laterality: N/A;  . LUMBAR LAMINECTOMY/DECOMPRESSION MICRODISCECTOMY Right 06/06/2018   Procedure: Right L3-4 disectomy;  Surgeon: Melina Schools, MD;  Location: [facility];  Service: Orthopedics;  Laterality: Right;  2.5 hrs  . MULTIPLE EXTRACTIONS WITH ALVEOLOPLASTY Bilateral 09/17/2019   Procedure: EXTRACTION OF TOOTH #'S [id number] WITH ALVEOLOPLASTY AND GROSS DEBRIDEMENT OF REMAINING TEETH;  Surgeon: Lenn Cal, DDS;  Location: [facility];  Service: Oral Surgery;  Laterality: Bilateral;  . RIGHT HEART CATH AND CORONARY/GRAFT ANGIOGRAPHY N/A 10/23/2017   Procedure: RIGHT HEART CATH AND CORONARY/GRAFT ANGIOGRAPHY;  Surgeon: [name], MD;  Location: [facility];  Service: Cardiovascular;  Laterality: N/A;  . RIGHT HEART CATH AND CORONARY/GRAFT ANGIOGRAPHY N/A 09/16/2019   Procedure: RIGHT HEART CATH AND CORONARY/GRAFT ANGIOGRAPHY;  Surgeon: Martinique, Peter M, MD;  Location: [facility];  Service: Cardiovascular;  Laterality:  N/A;  . TEE WITHOUT CARDIOVERSION N/A 07/22/2013   Procedure: TRANSESOPHAGEAL ECHOCARDIOGRAM (TEE);  Surgeon: [name], MD;  Location: [facility];  Service: Cardiovascular;  Laterality: N/A;  . TEE WITHOUT CARDIOVERSION N/A 09/11/2019   Procedure: TRANSESOPHAGEAL ECHOCARDIOGRAM (TEE);  Surgeon: Sanda Klein, MD;  Location: [facility];  Service: Cardiovascular;  Laterality: N/A;  . TEE WITHOUT CARDIOVERSION N/A 09/26/2019   Procedure: TRANSESOPHAGEAL ECHOCARDIOGRAM (TEE);  Surgeon: Gaye Pollack, MD;  Location: [facility];  Service: Open Heart Surgery;  Laterality: N/A;   FH  Family History  Problem Relation Age of Onset  . Coronary artery disease Mother   . Hypertension Mother   . Diabetes Brother   . Coronary artery disease Other 57       male 1st degree relative, CABG in his 49's (father)  . Cancer Father        ? lung   SH  reports that he has never smoked. He has never used smokeless tobacco. He reports that he does not drink alcohol or use drugs. Allergies  Allergies  Allergen Reactions  . Diltiazem Hives  . Insulin Glargine Swelling    edema   Home medications Prior to Admission medications   Medication Sig Start Date End Date Taking? Authorizing Provider  aspirin EC 81 MG tablet Take 162 mg by mouth daily as needed (headache).   Yes [provider]  atorvastatin (LIPITOR) 40 MG tablet TAKE 1 TABLET EVERY DAY  AT  6  PM Patient taking differently: Take 40 mg by mouth daily with breakfast.  08/26/19  Yes Biagio Borg, MD  cholecalciferol (VITAMIN D) 1000 UNITS tablet Take 1,000 Units by mouth daily with breakfast.    Yes [provider]  furosemide (LASIX) 40 MG tablet Take 1 tablet (40 mg total) by mouth 2 (two) times daily. Patient taking differently: Take 40 mg by mouth 2 (two) times daily with a meal.  09/05/19  Yes Crenshaw, Denice Bors, MD  gabapentin (NEURONTIN) 300 MG capsule Take 1 capsule (300 mg total) by mouth 3 (three) times daily. Patient  taking differently: Take 300 mg by mouth daily with breakfast.  01/09/19  Yes Jaynee Eagles, PA-C  glimepiride (AMARYL) 1 MG tablet TAKE 3 TABLETS ONE TIME DAILY WITH BREAKFAST Patient taking differently: Take 1 mg by mouth daily with breakfast.  03/14/17  Yes Biagio Borg, MD  levothyroxine (SYNTHROID) 50 MCG tablet TAKE 1 TABLET EVERY DAY Patient taking differently: Take 50 mcg by mouth daily with breakfast.  06/07/19  Yes Biagio Borg, MD  losartan (COZAAR) 100 MG tablet Take 1 tablet (100 mg total) by mouth daily. Patient taking differently: Take 100 mg by mouth daily with breakfast.  08/21/19  Yes Biagio Borg, MD  metFORMIN (GLUCOPHAGE) 1000 MG tablet Take 1 tablet (1,000 mg total) by mouth 2 (two) times daily with a meal. 09/28/18  Yes Biagio Borg, MD  metoprolol (TOPROL-XL) 200 MG 24 hr tablet Take 1 tablet (200 mg total) by mouth daily. Take with or immediately following a meal. Patient taking differently: Take 200 mg by mouth daily with breakfast. Take with or immediately following a meal. 09/05/19  Yes Kilroy, Luke K, PA-C  nitroGLYCERIN (NITROSTAT) 0.4 MG SL tablet Place 1 tablet (0.4 mg total) under the tongue every 5 (five) minutes x 3 doses as needed for chest pain. 07/25/13  Yes Kilroy, Luke K, PA-C  potassium chloride SA (KLOR-CON) 20 MEQ tablet Take 20 mEq by mouth 2 (two) times daily with a meal.   Yes [provider]  Vitamin D, Ergocalciferol, (DRISDOL) 1.25 MG (50000 UT) CAPS capsule Take 1 capsule (50,000 Units total) by mouth every 7 (seven) days. Patient taking differently: Take 50,000 Units by mouth every Tuesday.  04/02/19  Yes Biagio Borg, MD  warfarin (COUMADIN) 5 MG tablet TAKE 1 TO 1 AND 1/2 TABLETS EVERY DAY AS DIRECTED BY COUMADIN CLINIC Patient taking differently: Take 5-7.5 mg by mouth See admin instructions. Take 1 tablet (5 mg) by mouth on Monday, Wednesday, Friday with supper, take 1 1/2 tablets (7.5 mg) on Sunday, Tuesday, Thursday, Saturday with supper -  or as directed by coumadin clinic 08/28/19  Yes Crenshaw, Denice Bors, MD  amLODipine (NORVASC) 10 MG tablet Take 1 tablet (10 mg total) by mouth daily. 09/05/19   Erlene Quan, PA-C  Blood Glucose Monitoring Suppl (TRUE METRIX AIR GLUCOSE METER) DEVI 1 Device by Does not apply route daily. E11.9 08/26/19   Biagio Borg, MD  clopidogrel (PLAVIX) 75 MG tablet Take 1 tablet (75 mg total) by mouth daily. 09/05/19   Erlene Quan, PA-C  glucose blood (TRUE METRIX BLOOD GLUCOSE TEST) test strip Use as directed once daily 08/26/19   Biagio Borg, MD  enoxaparin (LOVENOX)  120 MG/0.8ML injection Inject 0.8 mLs (120 mg total) into the skin every 12 (twelve) hours. 06/19/18 01/09/19  Lelon Perla, MD    Current Medications Scheduled Meds: . sodium chloride   Intravenous Once  . acetaminophen  1,000 mg Oral Q6H   Or  . acetaminophen (TYLENOL) oral liquid 160 mg/5 mL  1,000 mg Per Tube Q6H  . aspirin EC  325 mg Oral Daily   Or  . aspirin  324 mg Per Tube Daily  . atorvastatin  40 mg Oral Q breakfast  . bisacodyl  10 mg Oral Daily   Or  . bisacodyl  10 mg Rectal Daily  . Chlorhexidine Gluconate Cloth  6 each Topical Daily  . docusate sodium  200 mg Oral Daily  . feeding supplement  1 Container Oral BID BM  . feeding supplement (ENSURE ENLIVE)  237 mL Oral BID BM  . feeding supplement (PRO-STAT SUGAR FREE 64)  30 mL Oral BID  . gabapentin  300 mg Oral Q breakfast  . gentamicin variable dose per unstable renal function (pharmacist dosing)   Does not apply See admin instructions  . insulin aspart  0-24 Units Subcutaneous Q4H  . insulin detemir  20 Units Subcutaneous Daily  . levothyroxine  50 mcg Oral Q0600  . mouth rinse  15 mL Mouth Rinse BID  . multivitamin with minerals  1 tablet Oral Daily  . pantoprazole  40 mg Oral Daily  . sodium chloride flush  3 mL Intravenous Q12H   Continuous Infusions: . sodium chloride Stopped (09/27/19 1005)  . sodium chloride 250 mL (09/27/19 1731)  . sodium  chloride 20 mL/hr at 09/27/19 0200  . amiodarone 30 mg/hr (09/29/19 1100)  . cefTRIAXone (ROCEPHIN)  IV Stopped (09/29/19 1019)  . dexmedetomidine (PRECEDEX) IV infusion Stopped (09/27/19 1153)  . DOPamine 2 mcg/kg/min (09/29/19 1100)  . epinephrine 1 mcg/min (09/29/19 1100)  . insulin 3.4 Units/hr (09/29/19 1122)  . lactated ringers    . lactated ringers    . lactated ringers 20 mL/hr at 09/29/19 1100  . nitroGLYCERIN    . norepinephrine (LEVOPHED) Adult infusion 1 mcg/min (09/29/19 1100)  . phenylephrine (NEO-SYNEPHRINE) Adult infusion     PRN Meds:.sodium chloride, dextrose, lactated ringers, levalbuterol, metoprolol tartrate, midazolam, morphine injection, ondansetron (ZOFRAN) IV, oxyCODONE, sodium chloride flush, traMADol  CBC Recent Labs  Lab 09/25/19 0533 09/26/19 0458 09/28/19 0430 09/28/19 0828 09/28/19 1911 09/29/19 0426 09/29/19 0430  WBC 5.4   < > 15.5*  --  13.1* 9.2  --   NEUTROABS 3.3  --   --   --   --   --   --   HGB 8.5*   < > 8.9*   < > 8.3* 7.8* 7.8*  HCT 24.7*   < > 26.6*   < > 24.8* 23.5* 23.0*  MCV 85.5   < > 86.1  --  88.3 87.0  --   PLT 151   < > 84*  --  76* 73*  --    < > = values in this interval not displayed.   Basic Metabolic Panel Recent Labs  Lab 09/25/19 0533 09/25/19 0533 09/26/19 0458 09/26/19 0755 09/26/19 2235 09/26/19 2235 09/26/19 2356 09/27/19 0134 09/27/19 0730 09/27/19 1124 09/27/19 1700 09/28/19 0430 09/28/19 0828 09/28/19 1848 09/28/19 1911 09/29/19 0308 09/29/19 0430  NA 139   < > 140   < > 143  144   < > 145   < > 142   < >  142 143 143 142 141 141 143  K 3.6   < > 3.9   < > 4.8  4.8   < > 4.4   < > 4.4   < > 4.8 5.0 5.0 5.0 5.2* 4.6 4.7  CL 110   < > 107   < > 107  --  109  --  114*  --  109 110  --   --  107 108  --   CO2 22  --  23  --   --   --   --   --  20*  --  20* 21*  --   --  20* 20*  --   GLUCOSE 141*   < > 146*   < > 165*  --  117*  --  148*  --  163* 158*  --   --  183* 154*  --   BUN 12   < > 14    < > 16  --  15  --  17  --  22* 27*  --   --  34* 37*  --   CREATININE 1.27*   < > 1.23   < > 1.30*  --  1.30*  --  1.88*  --  2.60* 3.58*  --   --  4.26* 4.46*  --   CALCIUM 8.7*  --  8.7*  --   --   --   --   --  7.6*  --  8.1* 8.4*  --   --  8.1* 8.1*  --    < > = values in this interval not displayed.    Physical Exam  Blood pressure 130/74, pulse (!) 103, temperature 98.5 F (36.9 C), temperature source Oral, resp. rate 16, height 6\' 2"  (1.88 m), weight (!) 139.7 kg, SpO2 97 %. GEN: Obese, NAD, lying in bed, awake and alert ENT: NCAT EYES: EOMI CV: Distant sounds, tachycardic, PULM: Coarse bilaterally ABD: Soft nontender SKIN: Surgical incisional sites bandaged EXT: 2+ edema  Assessment 58M with oliguric AKI after extensive cardiac surgery for streptococcal IE and periannular aneurysm.  1. AKI, nonoliguric, BL SCr 1.1-1.3; UA prior to surgery bland.  CTA abd prior to surgery w/o structural issues. Foley in.  On gentamycin, elevated trough levels.  Likely ATN from hemodynamic changes with surgery, gentamycin less likely primary cause, but not helping.  K and HCO3 ok. No clear uremia. No RRT necessary,  UOP picking up? 2. Streptococcal bacteremia and IE, as above; Ceftriaxone/Gent 3. Plasmacytoma, TCP/Anemia: oncology following 4. Hyopotension/shock on low dose Dopa/NE/Epi 5. PAFib, RVR overnight; amion 6. DM2 7. CAD s/p CABG  Plan 1. Maybe GFR is leveling off?   2. Trial of lasix 120 mg IV today 3. No RRT necessary at this time 4. Daily weights, Daily Renal Panel, Strict I/Os, Avoid nephrotoxins (NSAIDs, judicious IV Contrast) 5. I don't think we need repeat imaging at this time, or UA 6. Will follow along closely    [name]  [phone] pgr 09/29/2019, 11:49 AM

## 2019-09-29 NOTE — Progress Notes (Signed)
Subjective: The patient is seen and examined today.  He is feeling fine except for fatigue.  He denied having any chest pain but continues to have shortness of breath with no cough or hemoptysis.  He has no fever or chills.  The patient recently underwent cardiac surgery with debridement of periannular abscess, closure of LVOT to right atrial fistula, aortic root replacement, ligation of aneurysmal left main coronary artery, CABG x3 as well as replacement of ascending aortic graft and the proximal aortic arch with debridement and closure of right atrial fistula under the care of Dr. Cyndia Bent.  He is recovering slowly from his surgery. Regarding his plasma cell cytoma, the patient underwent a bone marrow biopsy and aspirate that was consistent with plasma cell neoplasm with 11% plasma cells in the bone marrow.  He also has skeletal bone survey that showed additional lytic lesions.  His myeloma panel was consistent with elevated IgG.  Objective: Vital signs in last 24 hours: Temp:  [97.5 F (36.4 C)-99 F (37.2 C)] 98.1 F (36.7 C) (05/09 1000) Pulse Rate:  [52-114] 104 (05/09 1000) Resp:  [11-33] 16 (05/09 1000) BP: (94-134)/(55-85) 126/70 (05/09 1000) SpO2:  [90 %-98 %] 96 % (05/09 1000) Arterial Line BP: (111-150)/(52-64) 117/54 (05/09 1000) Weight:  [307 lb 15.7 oz (139.7 kg)] 307 lb 15.7 oz (139.7 kg) (05/09 0630)  Intake/Output from previous day: 05/08 0701 - 05/09 0700 In: 1775.1 [P.O.:110; I.V.:1565.1; IV Piggyback:100] Out: 658 [Urine:508; Chest Tube:150] Intake/Output this shift: Total I/O In: 726.4 [P.O.:480; I.V.:181.7; Other:20; IV Piggyback:44.7] Out: 185 [Urine:185]  General appearance: alert, cooperative, fatigued and no distress Resp: clear to auscultation bilaterally Cardio: regular rate and rhythm, S1, S2 normal, no murmur, click, rub or gallop GI: soft, non-tender; bowel sounds normal; no masses,  no organomegaly Extremities: extremities normal, atraumatic, no cyanosis  or edema  Lab Results:  Recent Labs    09/28/19 1911 09/28/19 1911 09/29/19 0426 09/29/19 0430  WBC 13.1*  --  9.2  --   HGB 8.3*   < > 7.8* 7.8*  HCT 24.8*   < > 23.5* 23.0*  PLT 76*  --  73*  --    < > = values in this interval not displayed.   BMET Recent Labs    09/28/19 1911 09/28/19 1911 09/29/19 0308 09/29/19 0430  NA 141   < > 141 143  K 5.2*   < > 4.6 4.7  CL 107  --  108  --   CO2 20*  --  20*  --   GLUCOSE 183*  --  154*  --   BUN 34*  --  37*  --   CREATININE 4.26*  --  4.46*  --   CALCIUM 8.1*  --  8.1*  --    < > = values in this interval not displayed.    Studies/Results: DG Chest Port 1 View  Result Date: 09/29/2019 CLINICAL DATA:  Postoperative median sternotomy with concern for endocarditis EXAM: PORTABLE CHEST 1 VIEW COMPARISON:  Sep 28, 2019 FINDINGS: Swan-Ganz catheter tip is in the main pulmonary outflow tract. Left chest tube and mediastinal drain present. No appreciable pneumothorax. Airspace consolidation persists in the left lower lobe. Lungs elsewhere clear. Heart is enlarged, stable, with pulmonary vascularity normal. No adenopathy. No appreciable bone lesions. IMPRESSION: Tube and catheter positions unchanged without pneumothorax. Persistent airspace opacity left lower lobe. No new opacity evident. Stable cardiomegaly. Electronically Signed   By: Lowella Grip III M.D.   On: 09/29/2019 07:57  DG CHEST PORT 1 VIEW  Result Date: 09/28/2019 CLINICAL DATA:  Postoperative median sternotomy with aortic root replacement EXAM: PORTABLE CHEST 1 VIEW COMPARISON:  Sep 27, 2019 FINDINGS: Endotracheal tube has been removed. Swan-Ganz catheter tip is in the main pulmonary outflow tract. There is a left chest tube and a mediastinal drain. No pneumothorax. There is persistent airspace consolidation in the left lower lobe. No new opacity evident. Heart is mildly enlarged with pulmonary vascularity, stable. No adenopathy evident. No focal bone lesions are  appreciable. IMPRESSION: Endotracheal tube removed. Other tube and catheter positions as described. No pneumothorax. Persistent cardiac prominence. Persistent left lower lobe airspace consolidation. No new opacity. Electronically Signed   By: Lowella Grip III M.D.   On: 09/28/2019 08:34   Assessment/Plan: This is a very pleasant 54 years old African-American male with multiple medical problems and recent cardiac surgery.  The patient was also found during this admission to have plasmacytoma of the T10 vertebrae.  He underwent a bone marrow biopsy and aspirate before his cardiac surgery that was consistent with plasma cell neoplasm with 11% plasma cells in the bone marrow.  He also has myeloma panel that was consistent with the above diagnosis. I had a lengthy discussion with the patient today about his current condition of the plasma cell neoplasm. We will give the patient time to recover from his cardiac surgery which is a priority at this point. Once he is feeling fine, we will see him back on an outpatient basis at the cancer center for discussion of his treatment options.  He may benefit from palliative radiotherapy to the T10 lesion and this will be followed by systemic therapy with subcutaneous Velcade, oral Revlimid and Decadron. The patient agreed to the current plan. Thank you so much for taking good care of Mr. Caggiano.  Please call if you have any questions.   LOS: 21 days    Eilleen Kempf 09/29/2019

## 2019-09-29 NOTE — Progress Notes (Signed)
Patient ID: [name], male   DOB: March 26, 1966, 54 y.o.   MRN: [id number] EVENING ROUNDS NOTE :     [address]       [place] [address]             [phone]                 3 Days Post-Op Procedure(s) (LRB): REDO STERNOTOMY (N/A) REDO ASCENDING AORTIC ROOT REPLACEMENT USING LIFENET 25 MM CRYO, AORTIC VALVE HOMOGRAFT AND 30 MM HEMASHIELD PLATINUM GRAFT (N/A) CORONARY ARTERY BYPASS GRAFTING (CABG), ON PUMP, TIMES THREE, USING ENDOSCOPICALLY HARVESTED LEFT GREATER SAPHENOUS VEIN (N/A) TRANSESOPHAGEAL ECHOCARDIOGRAM (TEE) (N/A)  Total Length of Stay:  LOS: 21 days  BP 104/63   Pulse 100   Temp 98.5 F (36.9 C) (Oral)   Resp 13   Ht 6\' 2"  (1.88 m)   Wt (!) 139.7 kg   SpO2 98%   BMI 39.54 kg/m   .Intake/Output      05/08 0701 - 05/09 0700 05/09 0701 - 05/10 0700   P.O. 110 840   I.V. (mL/kg) 1565.1 (11.2) 285.1 (2)   Blood     Other  20   IV Piggyback 100 110.1   Total Intake(mL/kg) 1775.1 (12.7) 1255.3 (9)   Urine (mL/kg/hr) 508 (0.2) 310 (0.2)   Emesis/NG output     Chest Tube 150    Total Output 658 310   Net +1117.1 +945.3          . sodium chloride Stopped (09/27/19 1005)  . sodium chloride 250 mL (09/27/19 1731)  . sodium chloride 20 mL/hr at 09/27/19 0200  . amiodarone 30 mg/hr (09/29/19 1200)  . cefTRIAXone (ROCEPHIN)  IV Stopped (09/29/19 1019)  . dexmedetomidine (PRECEDEX) IV infusion Stopped (09/27/19 1153)  . DOPamine 2 mcg/kg/min (09/29/19 1200)  . epinephrine Stopped (09/29/19 1127)  . insulin 3.4 mL/hr at 09/29/19 1200  . lactated ringers    . lactated ringers    . lactated ringers 20 mL/hr at 09/29/19 1200  . nitroGLYCERIN    . norepinephrine (LEVOPHED) Adult infusion 1 mcg/min (09/29/19 1200)  . phenylephrine (NEO-SYNEPHRINE) Adult infusion       Lab Results  Component Value Date   WBC 9.2 09/29/2019   HGB 7.8 (L) 09/29/2019   HCT 23.0 (L) 09/29/2019   PLT 73 (L) 09/29/2019   GLUCOSE 154 (H) 09/29/2019   CHOL  132 04/02/2019   TRIG 123.0 04/02/2019   HDL 35.00 (L) 04/02/2019   LDLDIRECT 50.0 10/02/2018   LDLCALC 73 04/02/2019   ALT 17 09/29/2019   AST 31 09/29/2019   NA 143 09/29/2019   K 4.7 09/29/2019   CL 108 09/29/2019   CREATININE 4.46 (H) 09/29/2019   BUN 37 (H) 09/29/2019   CO2 20 (L) 09/29/2019   TSH 0.977 09/29/2019   PSA 1.04 10/02/2018   INR 1.0 09/27/2019   HGBA1C 7.3 (H) 09/26/2019   MICROALBUR 3.6 (H) 08/31/2017   Wean off levophed as tolerated uop improving  1150 past 12 hours  Sat in chair 5 hours   Grace Isaac MD  Beeper [phone] Office [phone] 09/29/2019 6:31 PM

## 2019-09-29 NOTE — Progress Notes (Signed)
Patient ID: [name], male   DOB: [birth year], 54 y.o.   MRN: [id number]          [facility]    Date of Admission:  09/08/2019   Total days of antibiotics 21         [name] has developed acute kidney injury postoperatively.  I have discontinued gentamicin and will continue ceftriaxone alone for his streptococcal bacteremia.  So far operative cultures of his aortic valve and tissue from his tricuspid valve are negative.  We will await final culture results to determine optimal duration of antibiotic therapy.         Michel Bickers, MD [facility] [place] [facility] [phone] pager   [phone] cell 09/29/2019, 2:56 PM

## 2019-09-29 NOTE — Progress Notes (Addendum)
Patient ID: [name], male   DOB: [birth year], 54 y.o.   MRN: [id number] TCTS DAILY ICU PROGRESS NOTE                   [address]            [place] [address]          [phone]   3 Days Post-Op Procedure(s) (LRB): REDO STERNOTOMY (N/A) REDO ASCENDING AORTIC ROOT REPLACEMENT USING LIFENET 25 MM CRYO, AORTIC VALVE HOMOGRAFT AND 30 MM HEMASHIELD PLATINUM GRAFT (N/A) CORONARY ARTERY BYPASS GRAFTING (CABG), ON PUMP, TIMES THREE, USING ENDOSCOPICALLY HARVESTED LEFT GREATER SAPHENOUS VEIN (N/A) TRANSESOPHAGEAL ECHOCARDIOGRAM (TEE) (N/A)  Total Length of Stay:  LOS: 21 days   Subjective: Awake alert neurologically intact, more talkative this morning Developed rapid atrial fib last night-started on IV amiodarone with bolus and drip  Objective: Vital signs in last 24 hours: Temp:  [97.5 F (36.4 C)-99 F (37.2 C)] 98.1 F (36.7 C) (05/09 0900) Pulse Rate:  [52-114] 103 (05/09 0900) Cardiac Rhythm: Normal sinus rhythm;Sinus tachycardia (05/09 0800) Resp:  [11-33] 18 (05/09 0900) BP: (94-134)/(55-85) 120/73 (05/09 0900) SpO2:  [90 %-98 %] 96 % (05/09 0900) Arterial Line BP: (111-150)/(52-64) 115/55 (05/09 0900) Weight:  [139.7 kg] 139.7 kg (05/09 0630)  Filed Weights   09/26/19 0430 09/28/19 0600 09/29/19 0630  Weight: 132 kg (!) 137.6 kg (!) 139.7 kg    Weight change: 2.1 kg   Hemodynamic parameters for last 24 hours: PAP: (25-43)/(15-34) 32/22 CO:  [6.9 L/min-8.3 L/min] 8.3 L/min CI:  [2.7 L/min/m2-3.2 L/min/m2] 3.2 L/min/m2  Intake/Output from previous day: 05/08 0701 - 05/09 0700 In: 1775.1 [P.O.:110; I.V.:1565.1; IV Piggyback:100] Out: 658 [Urine:508; Chest Tube:150]  Intake/Output this shift: Total I/O In: 381.1 [P.O.:240; I.V.:121.1; Other:20] Out: 110 [Urine:110]  Current Meds: Scheduled Meds: . sodium chloride   Intravenous Once  . acetaminophen  1,000 mg Oral Q6H   Or  . acetaminophen (TYLENOL) oral liquid 160 mg/5 mL  1,000 mg  Per Tube Q6H  . aspirin EC  325 mg Oral Daily   Or  . aspirin  324 mg Per Tube Daily  . atorvastatin  40 mg Oral Q breakfast  . bisacodyl  10 mg Oral Daily   Or  . bisacodyl  10 mg Rectal Daily  . Chlorhexidine Gluconate Cloth  6 each Topical Daily  . docusate sodium  200 mg Oral Daily  . feeding supplement  1 Container Oral BID BM  . feeding supplement (ENSURE ENLIVE)  237 mL Oral BID BM  . feeding supplement (PRO-STAT SUGAR FREE 64)  30 mL Oral BID  . gabapentin  300 mg Oral Q breakfast  . gentamicin variable dose per unstable renal function (pharmacist dosing)   Does not apply See admin instructions  . levothyroxine  50 mcg Oral Q0600  . mouth rinse  15 mL Mouth Rinse BID  . multivitamin with minerals  1 tablet Oral Daily  . pantoprazole  40 mg Oral Daily  . sodium chloride flush  3 mL Intravenous Q12H   Continuous Infusions: . sodium chloride Stopped (09/27/19 1005)  . sodium chloride 250 mL (09/27/19 1731)  . sodium chloride 20 mL/hr at 09/27/19 0200  . amiodarone 30 mg/hr (09/29/19 0900)  . cefTRIAXone (ROCEPHIN)  IV Stopped (09/28/19 1140)  . dexmedetomidine (PRECEDEX) IV infusion Stopped (09/27/19 1153)  . DOPamine 2 mcg/kg/min (09/29/19 0900)  . epinephrine 2 mcg/min (09/29/19 0900)  . insulin 4 mL/hr at 09/29/19  0900  . lactated ringers    . lactated ringers    . lactated ringers 20 mL/hr at 09/29/19 0900  . nitroGLYCERIN    . norepinephrine (LEVOPHED) Adult infusion 2 mcg/min (09/29/19 0900)  . phenylephrine (NEO-SYNEPHRINE) Adult infusion     PRN Meds:.sodium chloride, dextrose, lactated ringers, metoprolol tartrate, midazolam, morphine injection, ondansetron (ZOFRAN) IV, oxyCODONE, sodium chloride flush, traMADol  General appearance: alert, cooperative and mild distress Neurologic: intact Heart: regular rate and rhythm, S1, S2 normal, no murmur, click, rub or gallop Lungs: diminished breath sounds bibasilar Abdomen: soft, non-tender; bowel sounds normal; no  masses,  no organomegaly Extremities: extremities normal, atraumatic, no cyanosis or edema and Homans sign is negative, no sign of DVT Wound: Sternum stable dressing intact  Lab Results: CBC: Recent Labs    09/28/19 1911 09/28/19 1911 09/29/19 0426 09/29/19 0430  WBC 13.1*  --  9.2  --   HGB 8.3*   < > 7.8* 7.8*  HCT 24.8*   < > 23.5* 23.0*  PLT 76*  --  73*  --    < > = values in this interval not displayed.   BMET:  Recent Labs    09/28/19 1911 09/28/19 1911 09/29/19 0308 09/29/19 0430  NA 141   < > 141 143  K 5.2*   < > 4.6 4.7  CL 107  --  108  --   CO2 20*  --  20*  --   GLUCOSE 183*  --  154*  --   BUN 34*  --  37*  --   CREATININE 4.26*  --  4.46*  --   CALCIUM 8.1*  --  8.1*  --    < > = values in this interval not displayed.    CMET: Lab Results  Component Value Date   WBC 9.2 09/29/2019   HGB 7.8 (L) 09/29/2019   HCT 23.0 (L) 09/29/2019   PLT 73 (L) 09/29/2019   GLUCOSE 154 (H) 09/29/2019   CHOL 132 04/02/2019   TRIG 123.0 04/02/2019   HDL 35.00 (L) 04/02/2019   LDLDIRECT 50.0 10/02/2018   LDLCALC 73 04/02/2019   ALT 17 09/29/2019   AST 31 09/29/2019   NA 143 09/29/2019   K 4.7 09/29/2019   CL 108 09/29/2019   CREATININE 4.46 (H) 09/29/2019   BUN 37 (H) 09/29/2019   CO2 20 (L) 09/29/2019   TSH 0.977 09/29/2019   PSA 1.04 10/02/2018   INR 1.0 09/27/2019   HGBA1C 7.3 (H) 09/26/2019   MICROALBUR 3.6 (H) 08/31/2017      PT/INR:  Recent Labs    09/27/19 0140  LABPROT 12.4  INR 1.0   Radiology: [facility] Chest Port 1 View  Result Date: 09/29/2019 CLINICAL DATA:  Postoperative median sternotomy with concern for endocarditis EXAM: PORTABLE CHEST 1 VIEW COMPARISON:  Sep 28, 2019 FINDINGS: Swan-Ganz catheter tip is in the main pulmonary outflow tract. Left chest tube and mediastinal drain present. No appreciable pneumothorax. Airspace consolidation persists in the left lower lobe. Lungs elsewhere clear. Heart is enlarged, stable, with pulmonary  vascularity normal. No adenopathy. No appreciable bone lesions. IMPRESSION: Tube and catheter positions unchanged without pneumothorax. Persistent airspace opacity left lower lobe. No new opacity evident. Stable cardiomegaly. Electronically Signed   By: Lowella Grip III M.D.   On: 09/29/2019 07:57   Acute Kidney Injury (any one)  Increase in SCr by > 0.3 within 48 hours  Increase SCr to > 1.5 times baseline  Urine volume < 0.5  ml/kg/h for 6 hrs  ?Stage 1 - Increase in serum creatinine to 1.5 to 1.9 times baseline, or increase in serum creatinine by ?0.3 mg/dL (?26.5 micromol/L), or reduction in urine output to <0.5 mL/kg per hour for 6 to 12 hours.  ?Stage 2 - Increase in serum creatinine to 2.0 to 2.9 times baseline, or reduction in urine output to <0.5 mL/kg per hour for ?12 hours.  ?Stage 3 - Increase in serum creatinine to 3.0 times baseline, or increase in serum creatinine to ?4.0 mg/dL (?353.6 micromol/L), or reduction in urine output to <0.3 mL/kg per hour for ?24 hours, or anuria for ?12 hours, or the initiation of renal replacement therapy, or, in patients <18 years, decrease in eGFR to <35 mL   Lab Results  Component Value Date   CREATININE 4.46 (H) 09/29/2019   Estimated Creatinine Clearance: 28.2 mL/min (A) (by C-G formula based on SCr of 4.46 mg/dL (H)).   Assessment/Plan: S/P Procedure(s) (LRB): REDO STERNOTOMY (N/A) REDO ASCENDING AORTIC ROOT REPLACEMENT USING LIFENET 25 MM CRYO, AORTIC VALVE HOMOGRAFT AND 30 MM HEMASHIELD PLATINUM GRAFT (N/A) CORONARY ARTERY BYPASS GRAFTING (CABG), ON PUMP, TIMES THREE, USING ENDOSCOPICALLY HARVESTED LEFT GREATER SAPHENOUS VEIN (N/A) TRANSESOPHAGEAL ECHOCARDIOGRAM (TEE) (N/A) See progression orders DC some tubes and lines today, leave Foley for urine output monitoring Continue IV amiodarone for now Hemodynamics stable-still on dopamine epinephrine Levophed and insulin drip Convert to sliding scale insulin and Levemir wean off  insulin drip Acute kidney injury stage III-appears creatinine is plateauing and some increasing urine output over the past 12 hours-renal team to see today and start some diuresis Liver function studies normal on labs today Wean epi , then levaphed as pressure tolerates  Grace Isaac 09/29/2019 9:16 AM

## 2019-09-29 NOTE — Progress Notes (Signed)
Progress Note  Patient Name: [name] Date of Encounter: 09/29/2019  Primary Cardiologist: Kirk Ruths, MD   Subjective   "When can I get out of here?"  Inpatient Medications    Scheduled Meds: . sodium chloride   Intravenous Once  . acetaminophen  1,000 mg Oral Q6H   Or  . acetaminophen (TYLENOL) oral liquid 160 mg/5 mL  1,000 mg Per Tube Q6H  . aspirin EC  325 mg Oral Daily   Or  . aspirin  324 mg Per Tube Daily  . atorvastatin  40 mg Oral Q breakfast  . bisacodyl  10 mg Oral Daily   Or  . bisacodyl  10 mg Rectal Daily  . Chlorhexidine Gluconate Cloth  6 each Topical Daily  . docusate sodium  200 mg Oral Daily  . feeding supplement  1 Container Oral BID BM  . feeding supplement (ENSURE ENLIVE)  237 mL Oral BID BM  . feeding supplement (PRO-STAT SUGAR FREE 64)  30 mL Oral BID  . gabapentin  300 mg Oral Q breakfast  . gentamicin variable dose per unstable renal function (pharmacist dosing)   Does not apply See admin instructions  . levothyroxine  50 mcg Oral Q0600  . mouth rinse  15 mL Mouth Rinse BID  . multivitamin with minerals  1 tablet Oral Daily  . pantoprazole  40 mg Oral Daily  . sodium chloride flush  3 mL Intravenous Q12H   Continuous Infusions: . sodium chloride Stopped (09/27/19 1005)  . sodium chloride 250 mL (09/27/19 1731)  . sodium chloride 20 mL/hr at 09/27/19 0200  . amiodarone 30 mg/hr (09/29/19 0800)  . cefTRIAXone (ROCEPHIN)  IV Stopped (09/28/19 1140)  . dexmedetomidine (PRECEDEX) IV infusion Stopped (09/27/19 1153)  . DOPamine 2 mcg/kg/min (09/29/19 0800)  . epinephrine 2 mcg/min (09/29/19 0800)  . insulin 1.7 mL/hr at 09/29/19 0800  . lactated ringers    . lactated ringers    . lactated ringers 20 mL/hr at 09/29/19 0800  . nitroGLYCERIN    . norepinephrine (LEVOPHED) Adult infusion 3 mcg/min (09/29/19 0800)  . phenylephrine (NEO-SYNEPHRINE) Adult infusion     PRN Meds: sodium chloride, dextrose, lactated ringers, metoprolol  tartrate, midazolam, morphine injection, ondansetron (ZOFRAN) IV, oxyCODONE, sodium chloride flush, traMADol   Vital Signs    Vitals:   09/29/19 0600 09/29/19 0630 09/29/19 0700 09/29/19 0800  BP: 104/72 114/79 113/76 132/85  Pulse: (!) 103 (!) 103 (!) 102 (!) 102  Resp: 12 12 17 20   Temp: 97.7 F (36.5 C) 97.7 F (36.5 C) 97.7 F (36.5 C) 97.9 F (36.6 C)  TempSrc:      SpO2: 97% 97% 95% 95%  Weight:  (!) 139.7 kg    Height:        Intake/Output Summary (Last 24 hours) at 09/29/2019 0846 Last data filed at 09/29/2019 0800 Gross per 24 hour  Intake 2039.9 ml  Output 642 ml  Net 1397.9 ml   Filed Weights   09/26/19 0430 09/28/19 0600 09/29/19 0630  Weight: 132 kg (!) 137.6 kg (!) 139.7 kg    Telemetry    nsr with PVC's and atrial fib - Personally Reviewed  ECG    none - Personally Reviewed  Physical Exam   GEN: No acute distress.   Neck: No JVD Cardiac: RRR, no murmurs, rubs, or gallops.  Respiratory: Clear to auscultation bilaterally. GI: Soft, nontender, non-distended  MS: No edema; No deformity. Neuro:  Nonfocal  Psych: Normal affect   Labs  Chemistry Recent Labs  Lab 09/26/19 0458 09/26/19 0755 09/28/19 0430 09/28/19 0828 09/28/19 1911 09/29/19 0308 09/29/19 0430  NA 140   < > 143   < > 141 141 143  K 3.9   < > 5.0   < > 5.2* 4.6 4.7  CL 107   < > 110  --  107 108  --   CO2 23   < > 21*  --  20* 20*  --   GLUCOSE 146*   < > 158*  --  183* 154*  --   BUN 14   < > 27*  --  34* 37*  --   CREATININE 1.23   < > 3.58*  --  4.26* 4.46*  --   CALCIUM 8.7*   < > 8.4*  --  8.1* 8.1*  --   PROT 7.1  --  5.4*  --   --  5.1*  --   ALBUMIN 2.7*  --  2.6*  --   --  2.2*  --   AST 37  --  40  --   --  31  --   ALT 25  --  17  --   --  17  --   ALKPHOS 73  --  42  --   --  48  --   BILITOT 0.8  --  2.7*  --   --  3.2*  --   GFRNONAA >60   < > 18*  --  15* 14*  --   GFRAA >60   < > 21*  --  17* 16*  --   ANIONGAP 10   < > 12  --  14 13  --    < > =  values in this interval not displayed.     Hematology Recent Labs  Lab 09/28/19 0430 09/28/19 0828 09/28/19 1911 09/29/19 0426 09/29/19 0430  WBC 15.5*  --  13.1* 9.2  --   RBC 3.09*  --  2.81* 2.70*  --   HGB 8.9*   < > 8.3* 7.8* 7.8*  HCT 26.6*   < > 24.8* 23.5* 23.0*  MCV 86.1  --  88.3 87.0  --   MCH 28.8  --  29.5 28.9  --   MCHC 33.5  --  33.5 33.2  --   RDW 16.8*  --  17.0* 17.0*  --   PLT 84*  --  76* 73*  --    < > = values in this interval not displayed.    Cardiac EnzymesNo results for input(s): TROPONINI in the last 168 hours. No results for input(s): TROPIPOC in the last 168 hours.   BNPNo results for input(s): BNP, PROBNP in the last 168 hours.   DDimer No results for input(s): DDIMER in the last 168 hours.   Radiology    DG Chest Port 1 View  Result Date: 09/29/2019 CLINICAL DATA:  Postoperative median sternotomy with concern for endocarditis EXAM: PORTABLE CHEST 1 VIEW COMPARISON:  Sep 28, 2019 FINDINGS: Swan-Ganz catheter tip is in the main pulmonary outflow tract. Left chest tube and mediastinal drain present. No appreciable pneumothorax. Airspace consolidation persists in the left lower lobe. Lungs elsewhere clear. Heart is enlarged, stable, with pulmonary vascularity normal. No adenopathy. No appreciable bone lesions. IMPRESSION: Tube and catheter positions unchanged without pneumothorax. Persistent airspace opacity left lower lobe. No new opacity evident. Stable cardiomegaly. Electronically Signed   By: Lowella Grip III M.D.   On: 09/29/2019 07:57  DG CHEST PORT 1 VIEW  Result Date: 09/28/2019 CLINICAL DATA:  Postoperative median sternotomy with aortic root replacement EXAM: PORTABLE CHEST 1 VIEW COMPARISON:  Sep 27, 2019 FINDINGS: Endotracheal tube has been removed. Swan-Ganz catheter tip is in the main pulmonary outflow tract. There is a left chest tube and a mediastinal drain. No pneumothorax. There is persistent airspace consolidation in the left lower  lobe. No new opacity evident. Heart is mildly enlarged with pulmonary vascularity, stable. No adenopathy evident. No focal bone lesions are appreciable. IMPRESSION: Endotracheal tube removed. Other tube and catheter positions as described. No pneumothorax. Persistent cardiac prominence. Persistent left lower lobe airspace consolidation. No new opacity. Electronically Signed   By: Lowella Grip III M.D.   On: 09/28/2019 08:34    Cardiac Studies   none  Patient Profile     54 y.o. male admitted with endocarditis and s/p AVR  Assessment & Plan    1. PAF - he went into atrial fib with a RVR last night. Was given IV amiodarone and has returned to NSR. Ok to follow. If more atrial fib would start IV amiodarone drip. 2. POD# 2 s/p AVR - he appears to be doing well. More talkative today.  3. Volume overload - he is up 2 kg from yesteday and 7 kg since surgery. Consider adding IV lasix. Wean pressors as tolerated.     For questions or updates, please contact [place] Please consult [email or web address] for contact info under Cardiology/STEMI.      Signed, Cristopher Peru, MD  09/29/2019, 8:46 AM  Patient ID: [name], male   DOB: May 31, 1965, 55 y.o.   MRN: [id number]

## 2019-09-30 ENCOUNTER — Inpatient Hospital Stay (HOSPITAL_COMMUNITY): Payer: Medicare PPO

## 2019-09-30 DIAGNOSIS — Z9689 Presence of other specified functional implants: Secondary | ICD-10-CM

## 2019-09-30 DIAGNOSIS — S22079A Unspecified fracture of T9-T10 vertebra, initial encounter for closed fracture: Secondary | ICD-10-CM

## 2019-09-30 DIAGNOSIS — S22070S Wedge compression fracture of T9-T10 vertebra, sequela: Secondary | ICD-10-CM

## 2019-09-30 LAB — COOXEMETRY PANEL
Carboxyhemoglobin: 1.1 % (ref 0.5–1.5)
Methemoglobin: 0.7 % (ref 0.0–1.5)
O2 Saturation: 64 %
Total hemoglobin: 7.4 g/dL — ABNORMAL LOW (ref 12.0–16.0)

## 2019-09-30 LAB — CBC
HCT: 22.6 % — ABNORMAL LOW (ref 39.0–52.0)
Hemoglobin: 7.3 g/dL — ABNORMAL LOW (ref 13.0–17.0)
MCH: 28.3 pg (ref 26.0–34.0)
MCHC: 32.3 g/dL (ref 30.0–36.0)
MCV: 87.6 fL (ref 80.0–100.0)
Platelets: 77 10*3/uL — ABNORMAL LOW (ref 150–400)
RBC: 2.58 MIL/uL — ABNORMAL LOW (ref 4.22–5.81)
RDW: 16.9 % — ABNORMAL HIGH (ref 11.5–15.5)
WBC: 7 10*3/uL (ref 4.0–10.5)
nRBC: 0 % (ref 0.0–0.2)

## 2019-09-30 LAB — RENAL FUNCTION PANEL
Albumin: 2.2 g/dL — ABNORMAL LOW (ref 3.5–5.0)
Albumin: 2.2 g/dL — ABNORMAL LOW (ref 3.5–5.0)
Anion gap: 10 (ref 5–15)
Anion gap: 11 (ref 5–15)
BUN: 43 mg/dL — ABNORMAL HIGH (ref 6–20)
BUN: 40 mg/dL — ABNORMAL HIGH (ref 6–20)
CO2: 23 mmol/L (ref 22–32)
CO2: 21 mmol/L — ABNORMAL LOW (ref 22–32)
Calcium: 8.1 mg/dL — ABNORMAL LOW (ref 8.9–10.3)
Calcium: 7.8 mg/dL — ABNORMAL LOW (ref 8.9–10.3)
Chloride: 107 mmol/L (ref 98–111)
Chloride: 106 mmol/L (ref 98–111)
Creatinine, Ser: 4.24 mg/dL — ABNORMAL HIGH (ref 0.61–1.24)
Creatinine, Ser: 3.99 mg/dL — ABNORMAL HIGH (ref 0.61–1.24)
GFR calc Af Amer: 17 mL/min — ABNORMAL LOW (ref >60)
GFR calc Af Amer: 18 mL/min — ABNORMAL LOW (ref >60)
GFR calc non Af Amer: 15 mL/min — ABNORMAL LOW (ref >60)
GFR calc non Af Amer: 16 mL/min — ABNORMAL LOW (ref >60)
Glucose, Bld: 156 mg/dL — ABNORMAL HIGH (ref 70–99)
Glucose, Bld: 166 mg/dL — ABNORMAL HIGH (ref 70–99)
Phosphorus: 6.5 mg/dL — ABNORMAL HIGH (ref 2.5–4.6)
Phosphorus: 5.2 mg/dL — ABNORMAL HIGH (ref 2.5–4.6)
Potassium: 4.6 mmol/L (ref 3.5–5.1)
Potassium: 4.4 mmol/L (ref 3.5–5.1)
Sodium: 140 mmol/L (ref 135–145)
Sodium: 138 mmol/L (ref 135–145)

## 2019-09-30 LAB — GLUCOSE, CAPILLARY
Glucose-Capillary: 110 mg/dL — ABNORMAL HIGH (ref 70–99)
Glucose-Capillary: 133 mg/dL — ABNORMAL HIGH (ref 70–99)
Glucose-Capillary: 141 mg/dL — ABNORMAL HIGH (ref 70–99)
Glucose-Capillary: 143 mg/dL — ABNORMAL HIGH (ref 70–99)
Glucose-Capillary: 151 mg/dL — ABNORMAL HIGH (ref 70–99)
Glucose-Capillary: 173 mg/dL — ABNORMAL HIGH (ref 70–99)

## 2019-09-30 LAB — BETA 2 MICROGLOBULIN, SERUM: Beta-2 Microglobulin: 3.2 mg/L — ABNORMAL HIGH (ref 0.6–2.4)

## 2019-09-30 IMAGING — CR DG CHEST 1V PORT
1 series · 1 of 1 positions shown · non-contrast
Comparison: September 29, 2019

CLINICAL DATA: Postoperative median sternotomy. Recent aortic valve
replacement

EXAM:
PORTABLE CHEST 1 VIEW

[AP]
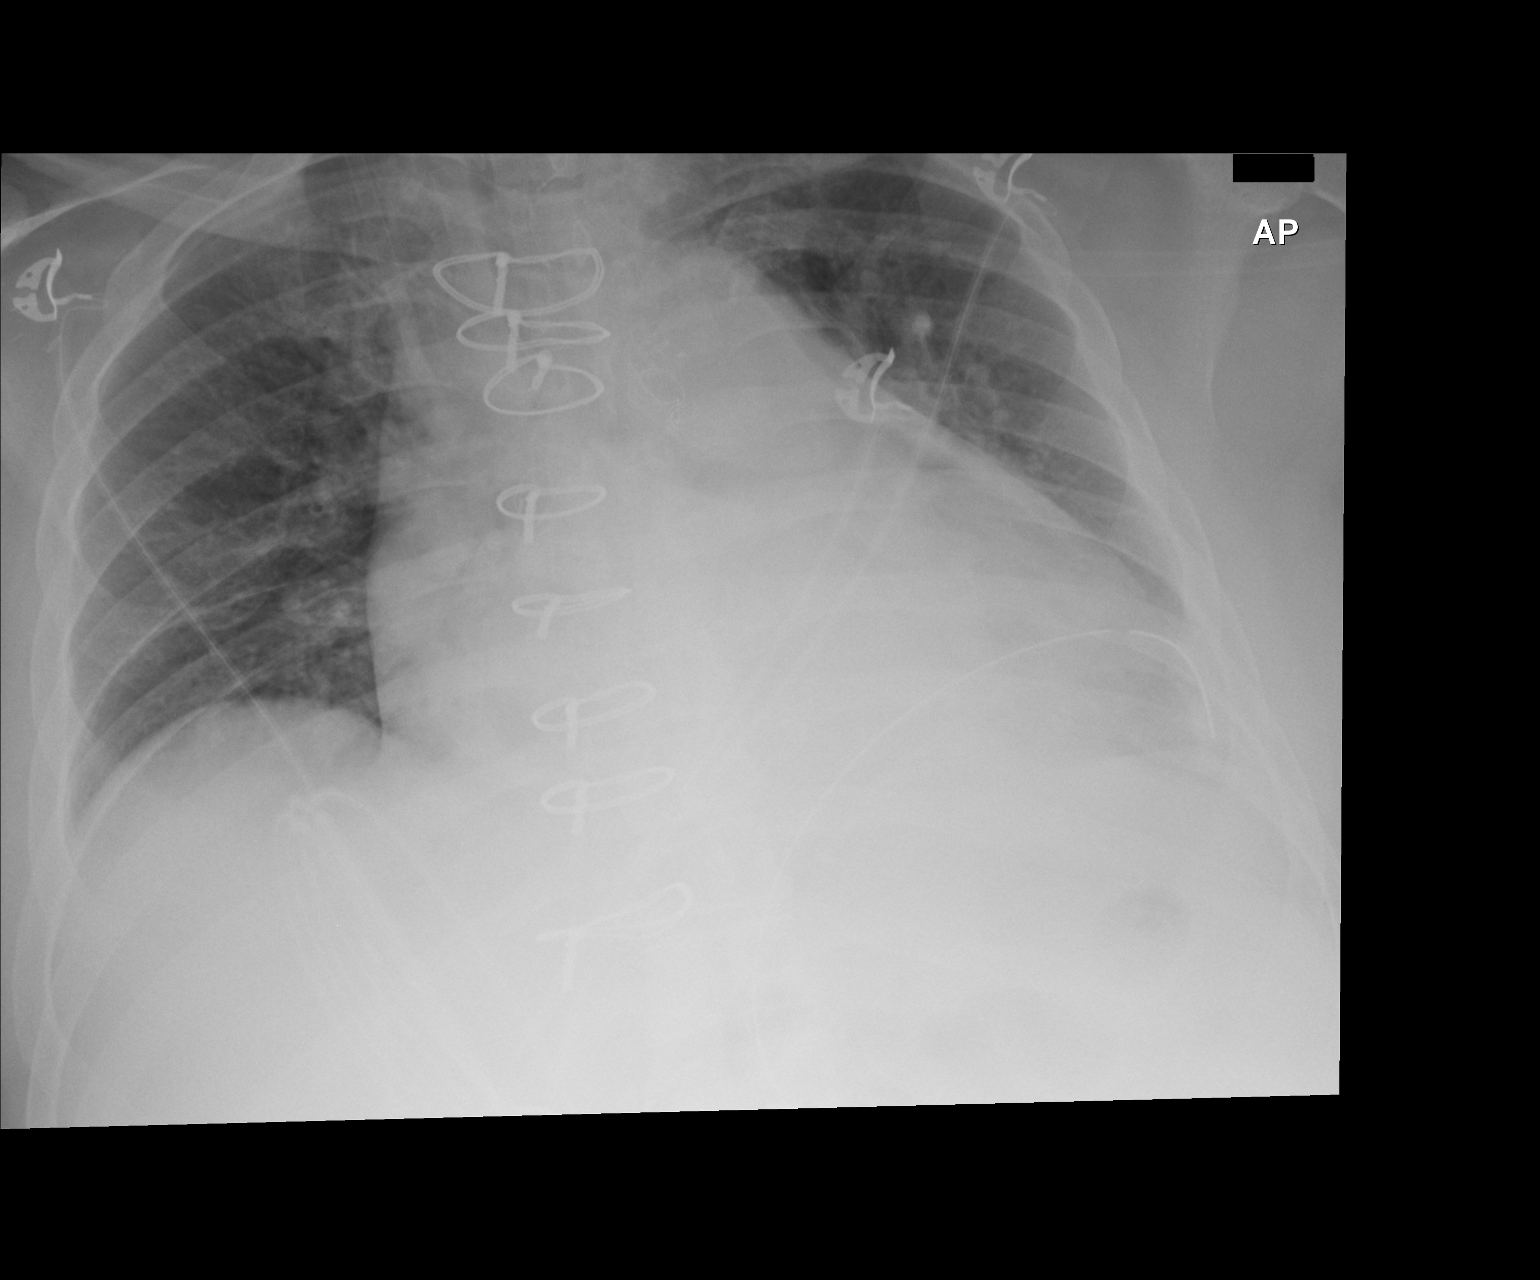

[1 of 1 positions shown; findings below may reference images not displayed]

FINDINGS: Swan-Ganz catheter has been removed. Cordis tip is in the superior
vena cava. There is a left chest tube. Mediastinal drain no longer
evident. No pneumothorax. There is no edema or airspace opacity.
There is persistent cardiac enlargement. The pulmonary vascularity
is normal. No adenopathy. No bone lesions evident.
IMPRESSION: Stable cardiomegaly.  Lungs clear.  No pneumothorax.

## 2019-09-30 MED ORDER — DOPAMINE-DEXTROSE 3.2-5 MG/ML-% IV SOLN
2.0000 ug/kg/min | INTRAVENOUS | Status: DC
  Filled 2019-09-30: qty 250

## 2019-09-30 MED ORDER — FE FUMARATE-B12-VIT C-FA-IFC PO CAPS
1.0000 | ORAL_CAPSULE | Freq: Two times a day (BID) | ORAL | Status: DC
  Administered 2019-09-30 – 2019-10-07 (×15): 1 via ORAL
  Filled 2019-09-30 (×15): qty 1

## 2019-09-30 MED ORDER — TRAMADOL HCL 50 MG PO TABS
50.0000 mg | ORAL_TABLET | Freq: Two times a day (BID) | ORAL | Status: DC | PRN
  Administered 2019-10-02: 08:00:00 50 mg via ORAL
  Filled 2019-09-30: qty 1

## 2019-09-30 MED ORDER — MAGIC MOUTHWASH
15.0000 mL | Freq: Three times a day (TID) | ORAL | Status: DC
  Administered 2019-09-30 – 2019-10-06 (×11): 15 mL via ORAL
  Filled 2019-09-30 (×23): qty 15

## 2019-09-30 MED ORDER — AMIODARONE HCL 200 MG PO TABS
400.0000 mg | ORAL_TABLET | Freq: Two times a day (BID) | ORAL | Status: DC
  Administered 2019-09-30 – 2019-10-07 (×15): 400 mg via ORAL
  Filled 2019-09-30 (×15): qty 2

## 2019-09-30 MED ORDER — LEVALBUTEROL HCL 0.63 MG/3ML IN NEBU
0.6300 mg | INHALATION_SOLUTION | Freq: Once | RESPIRATORY_TRACT | Status: AC
  Administered 2019-09-30: 0.63 mg via RESPIRATORY_TRACT
  Filled 2019-09-30: qty 3

## 2019-09-30 MED ORDER — SODIUM CHLORIDE 0.9% FLUSH: 10.0000 mL | INTRAVENOUS | Status: DC | PRN

## 2019-09-30 MED FILL — Magnesium Sulfate Inj 50%: INTRAMUSCULAR | Qty: 10 | Status: AC

## 2019-09-30 MED FILL — Heparin Sodium (Porcine) Inj 1000 Unit/ML: INTRAMUSCULAR | Qty: 30 | Status: AC

## 2019-09-30 MED FILL — Potassium Chloride Inj 2 mEq/ML: INTRAVENOUS | Qty: 40 | Status: AC

## 2019-09-30 NOTE — Progress Notes (Signed)
EVENING ROUNDS NOTE :     [address]       [place] [address]             [phone]                 4 Days Post-Op Procedure(s) (LRB): REDO STERNOTOMY (N/A) REDO ASCENDING AORTIC ROOT REPLACEMENT USING LIFENET 25 MM CRYO, AORTIC VALVE HOMOGRAFT AND 30 MM HEMASHIELD PLATINUM GRAFT (N/A) CORONARY ARTERY BYPASS GRAFTING (CABG), ON PUMP, TIMES THREE, USING ENDOSCOPICALLY HARVESTED LEFT GREATER SAPHENOUS VEIN (N/A) TRANSESOPHAGEAL ECHOCARDIOGRAM (TEE) (N/A)   Total Length of Stay:  LOS: 22 days  Events:   Doing well On room air Visiting with family    BP 103/72   Pulse (!) 101   Temp 98.8 F (37.1 C) (Oral)   Resp 15   Ht 6\' 2"  (1.88 m)   Wt 109.2 kg   SpO2 95%   BMI 30.91 kg/m         . sodium chloride Stopped (09/27/19 1005)  . sodium chloride 250 mL (09/27/19 1731)  . sodium chloride 20 mL/hr at 09/27/19 0200  . cefTRIAXone (ROCEPHIN)  IV Stopped (09/30/19 [id number])  . DOPamine 2 mcg/kg/min (09/30/19 1100)  . lactated ringers 10 mL/hr at 09/30/19 1100  . phenylephrine (NEO-SYNEPHRINE) Adult infusion      I/O last 3 completed shifts: In: 3478.3 [P.O.:1120; I.V.:2166.2; Other:20; IV Piggyback:172.1] Out: [id number]; Chest Tube:130]   CBC Latest Ref Rng & Units 09/30/2019 09/29/2019 09/29/2019  WBC 4.0 - 10.5 K/uL 7.0 - 9.2  Hemoglobin 13.0 - 17.0 g/dL 7.3(L) 7.8(L) 7.8(L)  Hematocrit 39.0 - 52.0 % 22.6(L) 23.0(L) 23.5(L)  Platelets 150 - 400 K/uL 77(L) - 73(L)    BMP Latest Ref Rng & Units 09/30/2019 09/30/2019 09/29/2019  Glucose 70 - 99 mg/dL 166(H) 156(H) -  BUN 6 - 20 mg/dL 40(H) 43(H) -  Creatinine 0.61 - 1.24 mg/dL 3.99(H) 4.24(H) -  Sodium 135 - 145 mmol/L 138 140 143  Potassium 3.5 - 5.1 mmol/L 4.4 4.6 4.7  Chloride 98 - 111 mmol/L 106 107 -  CO2 22 - 32 mmol/L 21(L) 23 -  Calcium 8.9 - 10.3 mg/dL 7.8(L) 8.1(L) -    ABG    Component Value Date/Time   PHART 7.321 (L) 09/29/2019 0430   PCO2ART 41.1 09/29/2019 0430   PO2ART 67  (L) 09/29/2019 0430   HCO3 21.3 09/29/2019 0430   TCO2 23 09/29/2019 0430   ACIDBASEDEF 5.0 (H) 09/29/2019 0430   O2SAT 64.0 09/30/2019 0408       Melodie Bouillon, MD 09/30/2019 4:02 PM

## 2019-09-30 NOTE — Progress Notes (Signed)
Nutrition Follow-up  DOCUMENTATION CODES:   Obesity unspecified  INTERVENTION:   Recommend liberalizing diet to REGULAR to promote po intake by allowing more food options  Continue Boost Breeze po BID, each supplement provides 250 kcal and 9 grams of protein.  Continue Pro-stat 30 ml PO BID, each supplement provides 100 kcal and 15 gm protein.  Continue Ensure Enlive po BID, each supplement provides 350 kcal and 20 grams of protein.  Add MVI with minerals daily.   NUTRITION DIAGNOSIS:   Increased nutrient needs related to acute illness(infectious endocarditis, buttocks abscess, upcoming surgery planned) as evidenced by estimated needs.  Being addressed via supplements   GOAL:   Patient will meet greater than or equal to 90% of their needs  Progressing  MONITOR:   PO intake, Supplement acceptance, Diet advancement, Labs  REASON FOR ASSESSMENT:   Consult Assessment of nutrition requirement/status(multiple dental extractions)  ASSESSMENT:   54 yo male admitted with N/V/D, DOE. Found to have infectious endocarditis. Also found to have a buttocks abscess and vertebral lesion. PMH includes HTN, HLD, PAF, TIA, DM, CAD s/p CABG 2000.  5/07 Re-Do median sternotomy, debridement of periannular abscess, CABG x 3  Assisted pt with getting set up for lunch today. Pt took a bite of soup but stated it did not have enough seasoning. Provided pt with salt and pepper to season soup. Pt only ordered soup, a muffin and fruit for lunch today. Does not really like the food. Pt reports he is taking the ONS however. Pt prefers Boost Breeze but drinking all of them  Recorded po intake 20-100% of meals but limited documentation recently. No recorded po since 5/08  Labs: Creatinine 3.99, BUN 40, phosphorus 5.2, CBGs 110-173 Meds: foltrin (ferrous fumarate, [id number] , Vitamin C, folic acid), ss novolog, levemir   Diet Order:   Diet Order            Diet regular Room service appropriate?  Yes; Fluid consistency: Thin  Diet effective now              EDUCATION NEEDS:   Not appropriate for education at this time  Skin:  Skin Assessment: Skin Integrity Issues: Skin Integrity Issues:: Other (Comment) Other: abscess to L buttocks  Last BM:  5/2  Height:   Ht Readings from Last 1 Encounters:  09/26/19 6\' 2"  (1.88 m)    Weight:   Wt Readings from Last 1 Encounters:  09/30/19 109.2 kg    Ideal Body Weight:  86.4 kg  BMI:  Body mass index is 30.91 kg/m.  Estimated Nutritional Needs:   Kcal:  2400-2600  Protein:  130-150 gm  Fluid:  >/= 2.4 L   Kerman Passey MS, RDN, LDN, CNSC RD Pager Number and RD On-Call Pager Number Located in [place]

## 2019-09-30 NOTE — Progress Notes (Signed)
Progress Note  Patient Name: [name] Date of Encounter: 09/30/2019  Primary Cardiologist: Kirk Ruths, MD   Subjective   Sitting in chair eating breakfast  Inpatient Medications    Scheduled Meds: . sodium chloride   Intravenous Once  . acetaminophen  1,000 mg Oral Q6H   Or  . acetaminophen (TYLENOL) oral liquid 160 mg/5 mL  1,000 mg Per Tube Q6H  . aspirin EC  325 mg Oral Daily   Or  . aspirin  324 mg Per Tube Daily  . atorvastatin  40 mg Oral Q breakfast  . bisacodyl  10 mg Oral Daily   Or  . bisacodyl  10 mg Rectal Daily  . Chlorhexidine Gluconate Cloth  6 each Topical Daily  . docusate sodium  200 mg Oral Daily  . feeding supplement  1 Container Oral BID BM  . feeding supplement (ENSURE ENLIVE)  237 mL Oral BID BM  . feeding supplement (PRO-STAT SUGAR FREE 64)  30 mL Oral BID  . gabapentin  300 mg Oral Q breakfast  . insulin aspart  0-24 Units Subcutaneous Q4H  . insulin detemir  20 Units Subcutaneous Daily  . levothyroxine  50 mcg Oral Q0600  . mouth rinse  15 mL Mouth Rinse BID  . multivitamin with minerals  1 tablet Oral Daily  . pantoprazole  40 mg Oral Daily  . sodium chloride flush  3 mL Intravenous Q12H   Continuous Infusions: . sodium chloride Stopped (09/27/19 1005)  . sodium chloride 250 mL (09/27/19 1731)  . sodium chloride 20 mL/hr at 09/27/19 0200  . amiodarone 30 mg/hr (09/30/19 0700)  . cefTRIAXone (ROCEPHIN)  IV Stopped (09/29/19 1019)  . dexmedetomidine (PRECEDEX) IV infusion Stopped (09/27/19 1153)  . DOPamine 2 mcg/kg/min (09/30/19 0700)  . epinephrine Stopped (09/29/19 1127)  . lactated ringers    . lactated ringers 10 mL/hr at 09/30/19 0700  . lactated ringers 20 mL/hr at 09/30/19 0500  . nitroGLYCERIN    . norepinephrine (LEVOPHED) Adult infusion Stopped (09/30/19 0600)  . phenylephrine (NEO-SYNEPHRINE) Adult infusion     PRN Meds: sodium chloride, dextrose, lactated ringers, levalbuterol, metoprolol tartrate, midazolam,  morphine injection, ondansetron (ZOFRAN) IV, oxyCODONE, sodium chloride flush, sodium chloride flush, traMADol   Vital Signs    Vitals:   09/30/19 0400 09/30/19 0500 09/30/19 0600 09/30/19 0700  BP: 101/67 102/78 119/84 115/71  Pulse: 85 91 (!) 103 98  Resp: 12 14 (!) 21 17  Temp: 98.8 F (37.1 C)     TempSrc: Oral     SpO2: 92% 95% 94% 94%  Weight:   109.2 kg   Height:        Intake/Output Summary (Last 24 hours) at 09/30/2019 0708 Last data filed at 09/30/2019 0700 Gross per 24 hour  Intake 2545.73 ml  Output 3270 ml  Net -724.27 ml   Filed Weights   09/28/19 0600 09/29/19 0630 09/30/19 0600  Weight: (!) 137.6 kg (!) 139.7 kg 109.2 kg    Telemetry    NSR 09/30/2019   ECG    AFib RBBB 09/28/19   Physical Exam  Affect appropriate Overweight black male  HEENT: normal Neck supple with no adenopathy JVP normal no bruits no thyromegaly Lungs clear with no wheezing and good diaphragmatic motion Heart:  S1/S2 SEM  murmur, no rub, gallop or click PMI normal post sternotomy  Abdomen: benighn, BS positve, no tenderness, no AAA no bruit.  No HSM or HJR Distal pulses intact with no bruits No edema  Neuro non-focal Skin warm and dry No muscular weakness   Labs    Chemistry Recent Labs  Lab 09/26/19 0458 09/26/19 0755 09/28/19 0430 09/28/19 0828 09/28/19 1911 09/28/19 1911 09/29/19 0308 09/29/19 0430 09/30/19 0408  NA 140   < > 143   < > 141   < > 141 143 140  K 3.9   < > 5.0   < > 5.2*   < > 4.6 4.7 4.6  CL 107   < > 110   < > 107  --  108  --  107  CO2 23   < > 21*   < > 20*  --  20*  --  23  GLUCOSE 146*   < > 158*   < > 183*  --  154*  --  156*  BUN 14   < > 27*   < > 34*  --  37*  --  43*  CREATININE 1.23   < > 3.58*   < > 4.26*  --  4.46*  --  4.24*  CALCIUM 8.7*   < > 8.4*   < > 8.1*  --  8.1*  --  8.1*  PROT 7.1  --  5.4*  --   --   --  5.1*  --   --   ALBUMIN 2.7*   < > 2.6*  --   --   --  2.2*  --  2.2*  AST 37  --  40  --   --   --  31  --   --    ALT 25  --  17  --   --   --  17  --   --   ALKPHOS 73  --  42  --   --   --  48  --   --   BILITOT 0.8  --  2.7*  --   --   --  3.2*  --   --   GFRNONAA >60   < > 18*   < > 15*  --  14*  --  15*  GFRAA >60   < > 21*   < > 17*  --  16*  --  17*  ANIONGAP 10   < > 12   < > 14  --  13  --  10   < > = values in this interval not displayed.     Hematology Recent Labs  Lab 09/28/19 1911 09/28/19 1911 09/29/19 0426 09/29/19 0430 09/30/19 0408  WBC 13.1*  --  9.2  --  7.0  RBC 2.81*  --  2.70*  --  2.58*  HGB 8.3*   < > 7.8* 7.8* 7.3*  HCT 24.8*   < > 23.5* 23.0* 22.6*  MCV 88.3  --  87.0  --  87.6  MCH 29.5  --  28.9  --  28.3  MCHC 33.5  --  33.2  --  32.3  RDW 17.0*  --  17.0*  --  16.9*  PLT 76*  --  73*  --  77*   < > = values in this interval not displayed.     Radiology    DG Chest Port 1 View  Result Date: 09/29/2019 CLINICAL DATA:  Postoperative median sternotomy with concern for endocarditis EXAM: PORTABLE CHEST 1 VIEW COMPARISON:  Sep 28, 2019 FINDINGS: Swan-Ganz catheter tip is in the main pulmonary outflow tract. Left chest tube and mediastinal drain present. No appreciable  pneumothorax. Airspace consolidation persists in the left lower lobe. Lungs elsewhere clear. Heart is enlarged, stable, with pulmonary vascularity normal. No adenopathy. No appreciable bone lesions. IMPRESSION: Tube and catheter positions unchanged without pneumothorax. Persistent airspace opacity left lower lobe. No new opacity evident. Stable cardiomegaly. Electronically Signed   By: Lowella Grip III M.D.   On: 09/29/2019 07:57    Cardiac Studies   none  Patient Profile     54 y.o. male admitted with endocarditis and s/p AVR  Assessment & Plan    1. PAF - in NSR this am stable  2. POD# 2 s/p AVR - no diastolic murmur SEM through valve homograft will need baseline echo after first office visit 3. Volume overload - lasix for volume overload per CVTS volume is up      For questions or  updates, please contact [facility] Please consult [email or web address] for contact info under Cardiology/STEMI.      Signed, Jenkins Rouge, MD  09/30/2019, 7:08 AM  Patient ID: [name], male   DOB: 12/19/1965, 54 y.o.   MRN: [id number]

## 2019-09-30 NOTE — Progress Notes (Signed)
[facility]ious [facility]  Date of Admission:  09/08/2019     Total days of antibiotics 22         ASSESSMENT:  [name] is POD 4 and continues to progress without complications. Surgical specimens remain without growth to date. Gentamycin was discontinued over the weekend secondary to worsening renal function.  Creatinine slightly improved today down to 4.24. Will continue with current dose of ceftriaxone for Streptococcus Infantaris prosthetic valve endocarditis. Plan remains for prolonged course of at least 6 weeks from surgical date of 5/6 though 6/17.   PLAN:  1. Continue ceftriaxone.  2. Monitor cultures until finalized. 3. Diabetes management and wound care per primary team.   Principal Problem:   Suspected endocarditis mechanical aortic valve Active Problems:   Diabetes mellitus type II, non insulin dependent (HCC)   Hyperlipidemia   Essential hypertension   PAF (paroxysmal atrial fibrillation) (HCC)   Hypothyroidism   Chronic combined systolic and diastolic congestive heart failure (HCC)   Normocytic anemia   Shortness of breath   Obesity (BMI 30-39.9)   CAD (coronary artery disease)   Nausea vomiting and diarrhea   Streptococcal bacteremia   Endocarditis of tricuspid valve   Plasmacytoma (HCC)   Endocarditis   . sodium chloride   Intravenous Once  . acetaminophen  1,000 mg Oral Q6H   Or  . acetaminophen (TYLENOL) oral liquid 160 mg/5 mL  1,000 mg Per Tube Q6H  . amiodarone  400 mg Oral BID  . aspirin EC  325 mg Oral Daily   Or  . aspirin  324 mg Per Tube Daily  . atorvastatin  40 mg Oral Q breakfast  . bisacodyl  10 mg Oral Daily   Or  . bisacodyl  10 mg Rectal Daily  . Chlorhexidine Gluconate Cloth  6 each Topical Daily  . docusate sodium  200 mg Oral Daily  . feeding supplement  1 Container Oral BID BM  . feeding supplement (ENSURE ENLIVE)  237 mL Oral BID BM  . feeding supplement (PRO-STAT SUGAR FREE 64)  30 mL Oral BID  .  ferrous [id number] C-folic acid  1 capsule Oral BID PC  . gabapentin  300 mg Oral Q breakfast  . insulin aspart  0-24 Units Subcutaneous Q4H  . insulin detemir  20 Units Subcutaneous Daily  . levothyroxine  50 mcg Oral Q0600  . mouth rinse  15 mL Mouth Rinse BID  . pantoprazole  40 mg Oral Daily  . sodium chloride flush  3 mL Intravenous Q12H    SUBJECTIVE:  Afebrile overnight with no acute events.   Allergies  Allergen Reactions  . Diltiazem Hives  . Insulin Glargine Swelling    edema     Review of Systems: Review of Systems  Constitutional: Negative for chills, fever and weight loss.  Respiratory: Negative for cough, shortness of breath and wheezing.   Cardiovascular: Negative for chest pain and leg swelling.  Gastrointestinal: Negative for abdominal pain, constipation, diarrhea, nausea and vomiting.  Skin: Negative for rash.      OBJECTIVE: Vitals:   09/30/19 0821 09/30/19 0900 09/30/19 1000 09/30/19 1100  BP:  119/87 100/76 117/82  Pulse: (!) 101 (!) 103 (!) 102 (!) 103  Resp: 20 16 15 15   Temp:      TempSrc:      SpO2:  95% 95% 96%  Weight:      Height:       Body mass index is 30.91 kg/m.  Physical Exam Constitutional:      General: He is not in acute distress.    Appearance: He is well-developed.  Cardiovascular:     Rate and Rhythm: Normal rate and regular rhythm.     Heart sounds: Normal heart sounds.  Pulmonary:     Effort: Pulmonary effort is normal.     Breath sounds: Normal breath sounds.  Skin:    General: Skin is warm and dry.  Neurological:     Mental Status: He is alert and oriented to person, place, and time.  Psychiatric:        Behavior: Behavior normal.        Thought Content: Thought content normal.        Judgment: Judgment normal.     Lab Results Lab Results  Component Value Date   WBC 7.0 09/30/2019   HGB 7.3 (L) 09/30/2019   HCT 22.6 (L) 09/30/2019   MCV 87.6 09/30/2019   PLT 77 (L) 09/30/2019    Lab  Results  Component Value Date   CREATININE 4.24 (H) 09/30/2019   BUN 43 (H) 09/30/2019   NA 140 09/30/2019   K 4.6 09/30/2019   CL 107 09/30/2019   CO2 23 09/30/2019    Lab Results  Component Value Date   ALT 17 09/29/2019   AST 31 09/29/2019   ALKPHOS 48 09/29/2019   BILITOT 3.2 (H) 09/29/2019     Microbiology: Recent Results (from the past 240 hour(s))  Surgical pcr screen     Status: None   Collection Time: 09/23/19  4:39 AM   Specimen: Nasal Mucosa; Nasal Swab  Result Value Ref Range Status   MRSA, PCR NEGATIVE NEGATIVE Final   Staphylococcus aureus NEGATIVE NEGATIVE Final    Comment: (NOTE) The Xpert SA Assay (FDA approved for NASAL specimens in patients 22 years of age and older), is one component of a comprehensive surveillance program. It is not intended to diagnose infection nor to guide or monitor treatment. Performed at [facility], [place] [address], [place], [place] [address]   Aerobic/Anaerobic Culture (surgical/deep wound)     Status: None (Preliminary result)   Collection Time: 09/26/19  1:03 PM   Specimen: Soft Tissue, Other  Result Value Ref Range Status   Specimen Description TISSUE  Final   Special Requests TRICUSPID VALVE VEGETATION SPEC A  Final   Gram Stain NO WBC SEEN NO ORGANISMS SEEN   Final   Culture   Final    NO GROWTH 4 DAYS NO ANAEROBES ISOLATED; CULTURE IN PROGRESS FOR 5 DAYS Performed at [facility], [place] [address] [place], [place] [address]    Report Status PENDING  Incomplete  Aerobic/Anaerobic Culture (surgical/deep wound)     Status: None (Preliminary result)   Collection Time: 09/26/19  1:47 PM   Specimen: PATH Soft tissue resection  Result Value Ref Range Status   Specimen Description TISSUE  Final   Special Requests EXPLANTED AORTIC VALVE GRAFT SPEC B  Final   Gram Stain NO WBC SEEN NO ORGANISMS SEEN   Final   Culture   Final    NO GROWTH 4 DAYS NO ANAEROBES ISOLATED; CULTURE IN PROGRESS FOR 5 DAYS Performed  at [facility], [place] [address] [place], [place] [address]    Report Status PENDING  Incomplete     Terri Piedra, NP [facility]  09/30/2019  11:49 AM

## 2019-09-30 NOTE — Progress Notes (Signed)
Subjective:  Sitting up in bedside chair-  Remains on pressors-  Made 3200 of urine- crt down a little-   BUN up   Objective Vital signs in last 24 hours: Vitals:   09/30/19 0300 09/30/19 0400 09/30/19 0500 09/30/19 0600  BP: (!) 107/93 101/67 102/78 119/84  Pulse: 98 85 91 (!) 103  Resp: 14 12 14  (!) 21  Temp:  98.8 F (37.1 C)    TempSrc:  Oral    SpO2: 94% 92% 95% 94%  Weight:    109.2 kg  Height:       Weight change: -30.5 kg  Intake/Output Summary (Last 24 hours) at 09/30/2019 0654 Last data filed at 09/30/2019 0630 Gross per 24 hour  Intake 2336.28 ml  Output 3270 ml  Net -933.72 ml    Assessment/ Plan: Pt is a 54 y.o. yo male DM, HTN, CAD and Afib, AAA who was admitted on 09/08/2019 with tricuspid valve endocarditis- s/p operative management complicated by AKI (crt normal on 5/6), new dx of plasmacytoma  Assessment/Plan: 1. Endocarditis-  S/p operative repair including CABG and replacement of aortic root. Per CTS- rocephin 2. Renal-  True AKI-  crt normal on 5/6.  Feel like is ATN due to above.   Nonoliguric, no indications for dialysis-  crt actually down a little- cont to watch  3. Anemia-  Due to surgical intervention and AKI--  Transfuse as needed   4. HTN/volume-  Overloaded but minimal O2 req- close to a liter negative last 24 hours after one dose of lasix yesterday.  I dont think needs for diuretics today-  Lets see if he diureses some on his own due to recovery 5.  Plasmacytoma-  11% plasma cells in bone marrow-  Allowing to recover from surgery and consider chemo/rediation at a later date  [facility]: Basic Metabolic Panel: Recent Labs  Lab 09/28/19 1911 09/28/19 1911 09/29/19 0308 09/29/19 0430 09/30/19 0408  NA 141   < > 141 143 140  K 5.2*   < > 4.6 4.7 4.6  CL 107  --  108  --  107  CO2 20*  --  20*  --  23  GLUCOSE 183*  --  154*  --  156*  BUN 34*  --  37*  --  43*  CREATININE 4.26*  --  4.46*  --  4.24*  CALCIUM 8.1*  --   8.1*  --  8.1*  PHOS  --   --   --   --  6.5*   < > = values in this interval not displayed.   Liver Function Tests: Recent Labs  Lab 09/26/19 0458 09/26/19 0458 09/28/19 0430 09/29/19 0308 09/30/19 0408  AST 37  --  40 31  --   ALT 25  --  17 17  --   ALKPHOS 73  --  42 48  --   BILITOT 0.8  --  2.7* 3.2*  --   PROT 7.1  --  5.4* 5.1*  --   ALBUMIN 2.7*   < > 2.6* 2.2* 2.2*   < > = values in this interval not displayed.   No results for input(s): LIPASE, AMYLASE in the last 168 hours. No results for input(s): AMMONIA in the last 168 hours. CBC: Recent Labs  Lab 09/25/19 0533 09/26/19 0458 09/27/19 1700 09/27/19 1700 09/28/19 0430 09/28/19 0828 09/28/19 1911 09/28/19 1911 09/29/19 0426 09/29/19 0430 09/30/19 0408  WBC 5.4   < >  14.4*   < > 15.5*   < > 13.1*  --  9.2  --  7.0  NEUTROABS 3.3  --   --   --   --   --   --   --   --   --   --   HGB 8.5*   < > 9.1*   < > 8.9*   < > 8.3*   < > 7.8* 7.8* 7.3*  HCT 24.7*   < > 26.6*   < > 26.6*   < > 24.8*   < > 23.5* 23.0* 22.6*  MCV 85.5   < > 83.6  --  86.1  --  88.3  --  87.0  --  87.6  PLT 151   < > 83*   < > 84*   < > 76*  --  73*  --  77*   < > = values in this interval not displayed.   Cardiac Enzymes: No results for input(s): CKTOTAL, CKMB, CKMBINDEX, TROPONINI in the last 168 hours. CBG: Recent Labs  Lab 09/29/19 1117 09/29/19 1523 09/29/19 2021 09/30/19 0000 09/30/19 0412  GLUCAP 150* 129* 168* 173* 151*    Iron Studies: No results for input(s): IRON, TIBC, TRANSFERRIN, FERRITIN in the last 72 hours. Studies/Results: DG Chest Port 1 View  Result Date: 09/29/2019 CLINICAL DATA:  Postoperative median sternotomy with concern for endocarditis EXAM: PORTABLE CHEST 1 VIEW COMPARISON:  Sep 28, 2019 FINDINGS: Swan-Ganz catheter tip is in the main pulmonary outflow tract. Left chest tube and mediastinal drain present. No appreciable pneumothorax. Airspace consolidation persists in the left lower lobe. Lungs  elsewhere clear. Heart is enlarged, stable, with pulmonary vascularity normal. No adenopathy. No appreciable bone lesions. IMPRESSION: Tube and catheter positions unchanged without pneumothorax. Persistent airspace opacity left lower lobe. No new opacity evident. Stable cardiomegaly. Electronically Signed   By: Lowella Grip III M.D.   On: 09/29/2019 07:57   Medications: Infusions: . sodium chloride Stopped (09/27/19 1005)  . sodium chloride 250 mL (09/27/19 1731)  . sodium chloride 20 mL/hr at 09/27/19 0200  . amiodarone 30 mg/hr (09/30/19 0600)  . cefTRIAXone (ROCEPHIN)  IV Stopped (09/29/19 1019)  . dexmedetomidine (PRECEDEX) IV infusion Stopped (09/27/19 1153)  . DOPamine 2 mcg/kg/min (09/30/19 0600)  . epinephrine Stopped (09/29/19 1127)  . lactated ringers    . lactated ringers Stopped (09/30/19 0559)  . lactated ringers 20 mL/hr at 09/30/19 0500  . nitroGLYCERIN    . norepinephrine (LEVOPHED) Adult infusion 1 mcg/min (09/30/19 0600)  . phenylephrine (NEO-SYNEPHRINE) Adult infusion      Scheduled Medications: . sodium chloride   Intravenous Once  . acetaminophen  1,000 mg Oral Q6H   Or  . acetaminophen (TYLENOL) oral liquid 160 mg/5 mL  1,000 mg Per Tube Q6H  . aspirin EC  325 mg Oral Daily   Or  . aspirin  324 mg Per Tube Daily  . atorvastatin  40 mg Oral Q breakfast  . bisacodyl  10 mg Oral Daily   Or  . bisacodyl  10 mg Rectal Daily  . Chlorhexidine Gluconate Cloth  6 each Topical Daily  . docusate sodium  200 mg Oral Daily  . feeding supplement  1 Container Oral BID BM  . feeding supplement (ENSURE ENLIVE)  237 mL Oral BID BM  . feeding supplement (PRO-STAT SUGAR FREE 64)  30 mL Oral BID  . gabapentin  300 mg Oral Q breakfast  . insulin aspart  0-24 Units Subcutaneous  Q4H  . insulin detemir  20 Units Subcutaneous Daily  . levothyroxine  50 mcg Oral Q0600  . mouth rinse  15 mL Mouth Rinse BID  . multivitamin with minerals  1 tablet Oral Daily  . pantoprazole   40 mg Oral Daily  . sodium chloride flush  3 mL Intravenous Q12H    have reviewed scheduled and prn medications.  Physical Exam: General: sitting up-  looks pretty good Heart: RRR Lungs: dec BS at bases-  CT in place Abdomen: distended-   Extremities: mild edema    09/30/2019,6:54 AM  LOS: 22 days

## 2019-09-30 NOTE — Progress Notes (Addendum)
TCTS DAILY ICU PROGRESS NOTE                   [address]            [place] [address]          [phone]   4 Days Post-Op Procedure(s) (LRB): REDO STERNOTOMY (N/A) REDO ASCENDING AORTIC ROOT REPLACEMENT USING LIFENET 25 MM CRYO, AORTIC VALVE HOMOGRAFT AND 30 MM HEMASHIELD PLATINUM GRAFT (N/A) CORONARY ARTERY BYPASS GRAFTING (CABG), ON PUMP, TIMES THREE, USING ENDOSCOPICALLY HARVESTED LEFT GREATER SAPHENOUS VEIN (N/A) TRANSESOPHAGEAL ECHOCARDIOGRAM (TEE) (N/A)  Total Length of Stay:  LOS: 22 days   Subjective: Patient sitting in chair this am. He does not like the food.  Objective: Vital signs in last 24 hours: Temp:  [97.9 F (36.6 C)-98.8 F (37.1 C)] 98.8 F (37.1 C) (05/10 0400) Pulse Rate:  [85-104] 98 (05/10 0700) Cardiac Rhythm: Sinus tachycardia (05/10 0400) Resp:  [0-31] 17 (05/10 0700) BP: (74-137)/(38-93) 115/71 (05/10 0700) SpO2:  [92 %-98 %] 94 % (05/10 0700) Arterial Line BP: (96-155)/(46-66) 113/58 (05/10 0500) Weight:  [109.2 kg] 109.2 kg (05/10 0600)  Filed Weights   09/28/19 0600 09/29/19 0630 09/30/19 0600  Weight: (!) 137.6 kg (!) 139.7 kg 109.2 kg    Weight change: -30.5 kg   Hemodynamic parameters for last 24 hours: PAP: (28-36)/(17-28) 28/22 CO:  [7.6 L/min] 7.6 L/min CI:  [3 L/min/m2] 3 L/min/m2  Intake/Output from previous day: 05/09 0701 - 05/10 0700 In: 2545.7 [P.O.:1120; I.V.:1233.6; IV Piggyback:172.1] Out: [id number] [Urine:3210; Chest Tube:60]  Intake/Output this shift: No intake/output data recorded.  Current Meds: Scheduled Meds: . sodium chloride   Intravenous Once  . acetaminophen  1,000 mg Oral Q6H   Or  . acetaminophen (TYLENOL) oral liquid 160 mg/5 mL  1,000 mg Per Tube Q6H  . aspirin EC  325 mg Oral Daily   Or  . aspirin  324 mg Per Tube Daily  . atorvastatin  40 mg Oral Q breakfast  . bisacodyl  10 mg Oral Daily   Or  . bisacodyl  10 mg Rectal Daily  . Chlorhexidine Gluconate Cloth  6 each  Topical Daily  . docusate sodium  200 mg Oral Daily  . feeding supplement  1 Container Oral BID BM  . feeding supplement (ENSURE ENLIVE)  237 mL Oral BID BM  . feeding supplement (PRO-STAT SUGAR FREE 64)  30 mL Oral BID  . gabapentin  300 mg Oral Q breakfast  . insulin aspart  0-24 Units Subcutaneous Q4H  . insulin detemir  20 Units Subcutaneous Daily  . levothyroxine  50 mcg Oral Q0600  . mouth rinse  15 mL Mouth Rinse BID  . multivitamin with minerals  1 tablet Oral Daily  . pantoprazole  40 mg Oral Daily  . sodium chloride flush  3 mL Intravenous Q12H   Continuous Infusions: . sodium chloride Stopped (09/27/19 1005)  . sodium chloride 250 mL (09/27/19 1731)  . sodium chloride 20 mL/hr at 09/27/19 0200  . amiodarone 30 mg/hr (09/30/19 0700)  . cefTRIAXone (ROCEPHIN)  IV Stopped (09/29/19 1019)  . dexmedetomidine (PRECEDEX) IV infusion Stopped (09/27/19 1153)  . DOPamine 2 mcg/kg/min (09/30/19 0700)  . epinephrine Stopped (09/29/19 1127)  . lactated ringers    . lactated ringers 10 mL/hr at 09/30/19 0700  . lactated ringers 20 mL/hr at 09/30/19 0500  . nitroGLYCERIN    . norepinephrine (LEVOPHED) Adult infusion Stopped (09/30/19 0600)  . phenylephrine (NEO-SYNEPHRINE) Adult  infusion     PRN Meds:.sodium chloride, dextrose, lactated ringers, levalbuterol, metoprolol tartrate, midazolam, morphine injection, ondansetron (ZOFRAN) IV, oxyCODONE, sodium chloride flush, sodium chloride flush, traMADol  General appearance: alert, cooperative, no distress and moderately obese Neurologic: intact Heart: Slightly tachycardic  Lungs: Expiratory wheezing Abdomen: Soft, non tender, sporadic bowel sounds Extremities: +++ LE edmea Wound: Aquacel removed and sternal wound with slight bloody drainage proximal and mid sternum. RLE wounds are clean and dry  Lab Results: CBC: Recent Labs    09/29/19 0426 09/29/19 0426 09/29/19 0430 09/30/19 0408  WBC 9.2  --   --  7.0  HGB 7.8*   < > 7.8*  7.3*  HCT 23.5*   < > 23.0* 22.6*  PLT 73*  --   --  77*   < > = values in this interval not displayed.   BMET:  Recent Labs    09/29/19 0308 09/29/19 0308 09/29/19 0430 09/30/19 0408  NA 141   < > 143 140  K 4.6   < > 4.7 4.6  CL 108  --   --  107  CO2 20*  --   --  23  GLUCOSE 154*  --   --  156*  BUN 37*  --   --  43*  CREATININE 4.46*  --   --  4.24*  CALCIUM 8.1*  --   --  8.1*   < > = values in this interval not displayed.    CMET: Lab Results  Component Value Date   WBC 7.0 09/30/2019   HGB 7.3 (L) 09/30/2019   HCT 22.6 (L) 09/30/2019   PLT 77 (L) 09/30/2019   GLUCOSE 156 (H) 09/30/2019   CHOL 132 04/02/2019   TRIG 123.0 04/02/2019   HDL 35.00 (L) 04/02/2019   LDLDIRECT 50.0 10/02/2018   LDLCALC 73 04/02/2019   ALT 17 09/29/2019   AST 31 09/29/2019   NA 140 09/30/2019   K 4.6 09/30/2019   CL 107 09/30/2019   CREATININE 4.24 (H) 09/30/2019   BUN 43 (H) 09/30/2019   CO2 23 09/30/2019   TSH 0.977 09/29/2019   PSA 1.04 10/02/2018   INR 1.0 09/27/2019   HGBA1C 7.3 (H) 09/26/2019   MICROALBUR 3.6 (H) 08/31/2017      PT/INR: No results for input(s): LABPROT, INR in the last 72 hours. Radiology: No results found.   Assessment/Plan: S/P Procedure(s) (LRB): REDO STERNOTOMY (N/A) REDO ASCENDING AORTIC ROOT REPLACEMENT USING LIFENET 25 MM CRYO, AORTIC VALVE HOMOGRAFT AND 30 MM HEMASHIELD PLATINUM GRAFT (N/A) CORONARY ARTERY BYPASS GRAFTING (CABG), ON PUMP, TIMES THREE, USING ENDOSCOPICALLY HARVESTED LEFT GREATER SAPHENOUS VEIN (N/A) TRANSESOPHAGEAL ECHOCARDIOGRAM (TEE) (N/A)   1. CV-He went into a fib with RVR the evening of 05/08. ST at times with HR in the low 100's. On Dopamine and Amiodarone drips. Co ox this am 64. Will stop Amiodarone drip and start oral. 2. Pulmonary-on 1 liter of oxygen via [place]. Chest tube with 60 cc last 24 hours? CXR this am appears to show patient rotated to the right, cardiomegaly, no pneumothorax. Remove chest tube and give  Xopenex this am. Encourage incentive spirometer. 3. Volume overload-per nephrology, no diuretic today as will monitor to see if diureses on own secondary to AKI 4. DM-CBGs 168/173/151. On Insulin 20 units [place] daily. Pre op HGA1C 7.3. 5. ID-on Ceftriaxone for Strep bacteremia (Gentamycin discontinued secondary to AKI). Awaiting final cultures;no growth to date 6. Expected post op blood loss anemia-H and H this am slightly decreased  to 7.3 and 22.6 7. AKI-Possible etiology ATN. Creatinine slightly decreased to 4.24 8. Thrombocytopenia-platelets this am 77,000 9. 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 Remove sleeve  Donielle Liston Alba PA-C 09/30/2019 7:04 AM    Chart reviewed, patient examined, agree with above. He is doing well overall considering his surgery. AKI due to preop CKD with mildly increased creat, poorly controlled DM, followed by very long pump run, nephrotoxic antibiotics. He diuresed well yesterday with 120 mg lasix and creat down slightly. Will hold off on further diuretics and observe today. Continue low dose dopamine. Wt today is inaccurate but yesterday was 17 lbs over preop.  Hold off on Lovenox with thrombocytopenia. Use SCD's when in bed.  Acute postop blood loss anemia: follow and start iron.   Operative cultures negative so far. 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 There were NO vegetations on the tricuspid valve.

## 2019-10-01 ENCOUNTER — Inpatient Hospital Stay (HOSPITAL_COMMUNITY): Payer: Medicare PPO

## 2019-10-01 ENCOUNTER — Ambulatory Visit: Payer: Medicare PPO | Admitting: Internal Medicine

## 2019-10-01 ENCOUNTER — Encounter (HOSPITAL_COMMUNITY): Payer: Self-pay | Admitting: Internal Medicine

## 2019-10-01 LAB — RENAL FUNCTION PANEL
Albumin: 2.2 g/dL — ABNORMAL LOW (ref 3.5–5.0)
Anion gap: 10 (ref 5–15)
BUN: 39 mg/dL — ABNORMAL HIGH (ref 6–20)
CO2: 24 mmol/L (ref 22–32)
Calcium: 8.2 mg/dL — ABNORMAL LOW (ref 8.9–10.3)
Chloride: 106 mmol/L (ref 98–111)
Creatinine, Ser: 3.68 mg/dL — ABNORMAL HIGH (ref 0.61–1.24)
GFR calc Af Amer: 20 mL/min — ABNORMAL LOW (ref >60)
GFR calc non Af Amer: 18 mL/min — ABNORMAL LOW (ref >60)
Glucose, Bld: 97 mg/dL (ref 70–99)
Phosphorus: 5 mg/dL — ABNORMAL HIGH (ref 2.5–4.6)
Potassium: 4.5 mmol/L (ref 3.5–5.1)
Sodium: 140 mmol/L (ref 135–145)

## 2019-10-01 LAB — CBC
HCT: 22.4 % — ABNORMAL LOW (ref 39.0–52.0)
Hemoglobin: 7.5 g/dL — ABNORMAL LOW (ref 13.0–17.0)
MCH: 29.2 pg (ref 26.0–34.0)
MCHC: 33.5 g/dL (ref 30.0–36.0)
MCV: 87.2 fL (ref 80.0–100.0)
Platelets: 89 10*3/uL — ABNORMAL LOW (ref 150–400)
RBC: 2.57 MIL/uL — ABNORMAL LOW (ref 4.22–5.81)
RDW: 16.7 % — ABNORMAL HIGH (ref 11.5–15.5)
WBC: 6.2 10*3/uL (ref 4.0–10.5)
nRBC: 0 % (ref 0.0–0.2)

## 2019-10-01 LAB — AEROBIC/ANAEROBIC CULTURE W GRAM STAIN (SURGICAL/DEEP WOUND)
Culture: NO GROWTH
Culture: NO GROWTH
Gram Stain: NONE SEEN
Gram Stain: NONE SEEN

## 2019-10-01 LAB — GLUCOSE, CAPILLARY
Glucose-Capillary: 122 mg/dL — ABNORMAL HIGH (ref 70–99)
Glucose-Capillary: 127 mg/dL — ABNORMAL HIGH (ref 70–99)
Glucose-Capillary: 130 mg/dL — ABNORMAL HIGH (ref 70–99)
Glucose-Capillary: 165 mg/dL — ABNORMAL HIGH (ref 70–99)
Glucose-Capillary: 181 mg/dL — ABNORMAL HIGH (ref 70–99)
Glucose-Capillary: 78 mg/dL (ref 70–99)

## 2019-10-01 LAB — SURGICAL PATHOLOGY

## 2019-10-01 IMAGING — DX DG CHEST 1V PORT
1 series · 1 of 1 positions shown · non-contrast
Comparison: Chest x-ray from yesterday.

CLINICAL DATA: Recent cardiac surgery.

EXAM:
PORTABLE CHEST 1 VIEW

[chest ap]
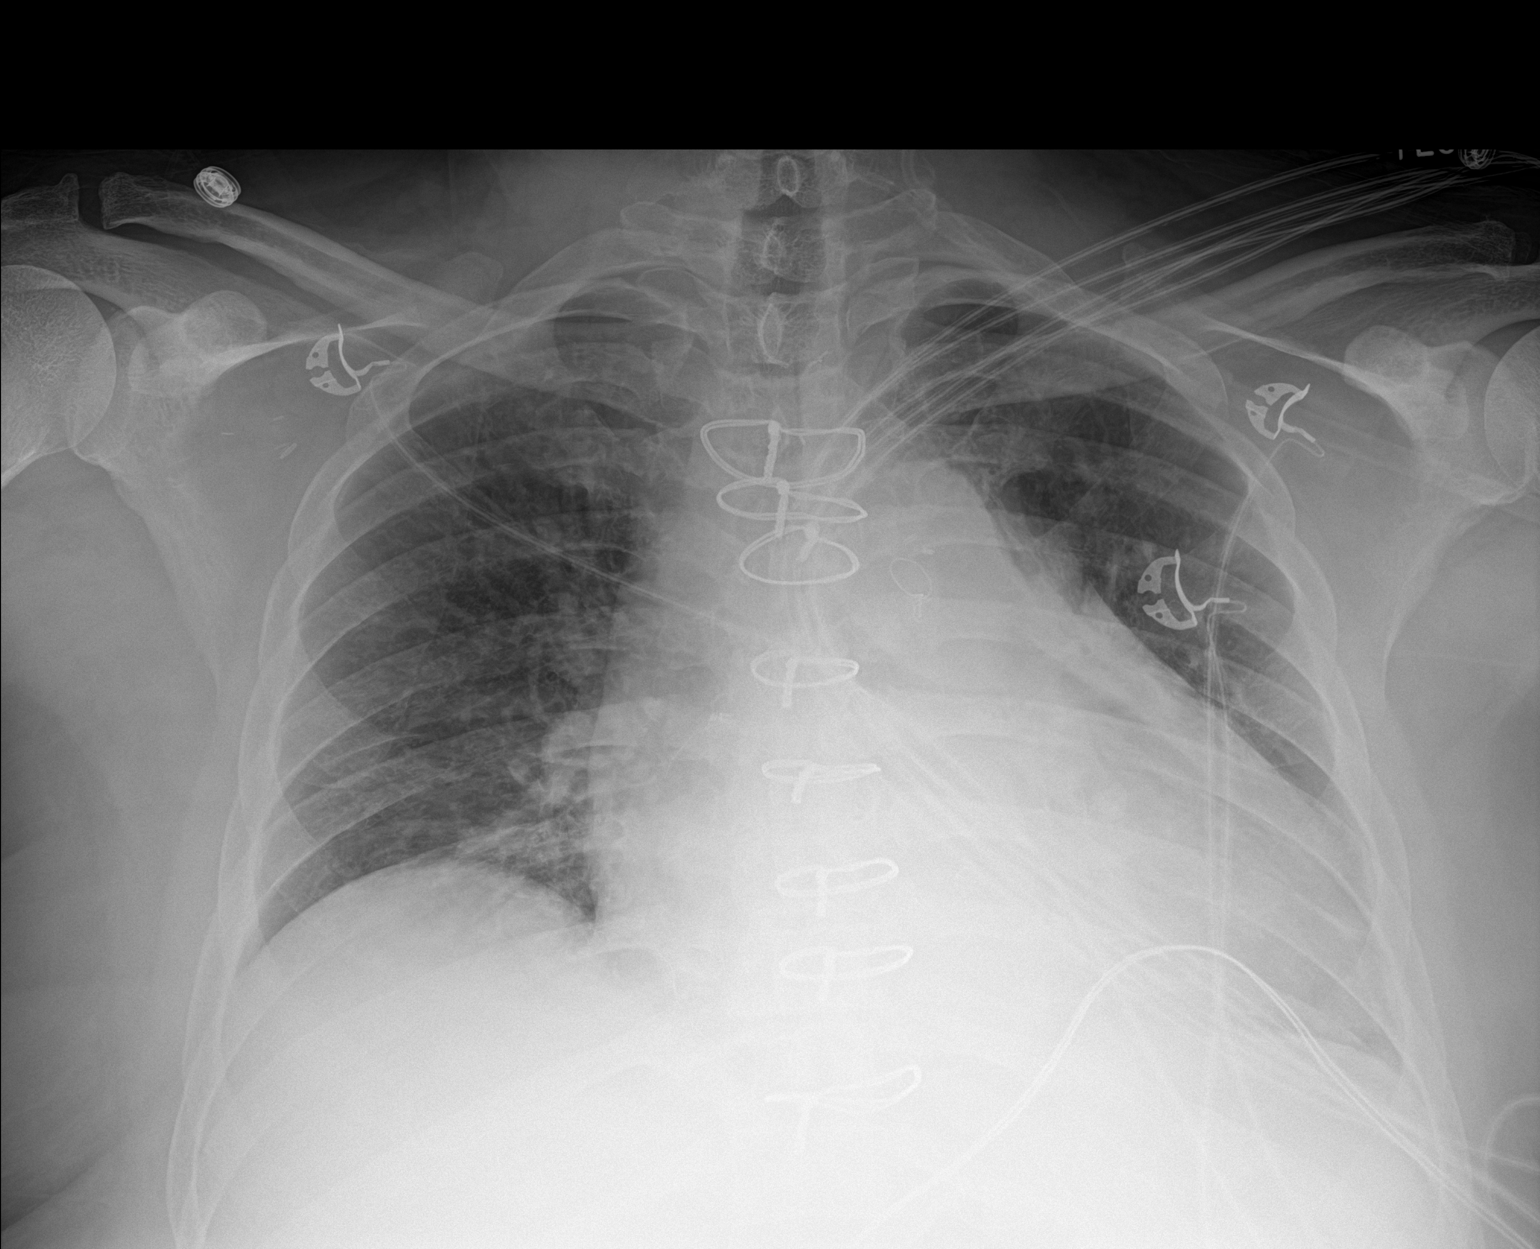

[1 of 1 positions shown; findings below may reference images not displayed]

FINDINGS: Interval removal of the right internal jugular sheath and left chest
tube. Unchanged cardiomegaly status post CABG. Normal pulmonary
vascularity. Mild bibasilar atelectasis. No focal consolidation,
pleural effusion, or pneumothorax. No acute osseous abnormality.
IMPRESSION: 1. Mild bibasilar atelectasis.

## 2019-10-01 MED ORDER — GUAIFENESIN ER 600 MG PO TB12
600.0000 mg | ORAL_TABLET | Freq: Two times a day (BID) | ORAL | Status: DC
  Administered 2019-10-01 – 2019-10-07 (×13): 600 mg via ORAL
  Filled 2019-10-01 (×13): qty 1

## 2019-10-01 MED ORDER — DIPHENHYDRAMINE HCL 25 MG PO CAPS
25.0000 mg | ORAL_CAPSULE | Freq: Every day | ORAL | Status: DC
  Administered 2019-10-01 – 2019-10-06 (×6): 25 mg via ORAL
  Filled 2019-10-01 (×7): qty 1

## 2019-10-01 MED ORDER — INSULIN ASPART 100 UNIT/ML ~~LOC~~ SOLN
0.0000 [IU] | Freq: Three times a day (TID) | SUBCUTANEOUS | Status: DC
  Administered 2019-10-01: 4 [IU] via SUBCUTANEOUS
  Administered 2019-10-01: 23:00:00 2 [IU] via SUBCUTANEOUS
  Administered 2019-10-01: 4 [IU] via SUBCUTANEOUS
  Administered 2019-10-01: 2 [IU] via SUBCUTANEOUS
  Administered 2019-10-02 (×2): 4 [IU] via SUBCUTANEOUS
  Administered 2019-10-02 – 2019-10-03 (×2): 2 [IU] via SUBCUTANEOUS
  Administered 2019-10-03: 4 [IU] via SUBCUTANEOUS
  Administered 2019-10-03 – 2019-10-06 (×7): 2 [IU] via SUBCUTANEOUS
  Administered 2019-10-06: 4 [IU] via SUBCUTANEOUS
  Administered 2019-10-07: 2 [IU] via SUBCUTANEOUS

## 2019-10-01 MED ORDER — POLYETHYLENE GLYCOL 3350 17 G PO PACK
17.0000 g | PACK | Freq: Once | ORAL | Status: AC
  Administered 2019-10-01: 17 g via ORAL
  Filled 2019-10-01: qty 1

## 2019-10-01 NOTE — Progress Notes (Signed)
TCTS BRIEF SICU PROGRESS NOTE  5 Days Post-Op  S/P Procedure(s) (LRB): REDO STERNOTOMY (N/A) REDO ASCENDING AORTIC ROOT REPLACEMENT USING LIFENET 25 MM CRYO, AORTIC VALVE HOMOGRAFT AND 30 MM HEMASHIELD PLATINUM GRAFT (N/A) CORONARY ARTERY BYPASS GRAFTING (CABG), ON PUMP, TIMES THREE, USING ENDOSCOPICALLY HARVESTED LEFT GREATER SAPHENOUS VEIN (N/A) TRANSESOPHAGEAL ECHOCARDIOGRAM (TEE) (N/A)   Stable day  Plan: Continue current plan  Rexene Alberts, MD 10/01/2019 7:13 PM

## 2019-10-01 NOTE — Progress Notes (Signed)
Dressing change orders reviewed for L. buttock abscess, pt. Assessed and no open wound present. Pt. Stated the wound has healed.  Eden Emms, RN

## 2019-10-01 NOTE — Progress Notes (Signed)
Progress Note  Patient Name: [name] Date of Encounter: 10/01/2019  Primary Cardiologist: Kirk Ruths, MD   Subjective   Sitting in chair using IS   Inpatient Medications    Scheduled Meds: . sodium chloride   Intravenous Once  . acetaminophen  1,000 mg Oral Q6H   Or  . acetaminophen (TYLENOL) oral liquid 160 mg/5 mL  1,000 mg Per Tube Q6H  . amiodarone  400 mg Oral BID  . aspirin EC  325 mg Oral Daily   Or  . aspirin  324 mg Per Tube Daily  . atorvastatin  40 mg Oral Q breakfast  . bisacodyl  10 mg Oral Daily   Or  . bisacodyl  10 mg Rectal Daily  . Chlorhexidine Gluconate Cloth  6 each Topical Daily  . diphenhydrAMINE  25 mg Oral QHS  . docusate sodium  200 mg Oral Daily  . feeding supplement  1 Container Oral BID BM  . feeding supplement (ENSURE ENLIVE)  237 mL Oral BID BM  . feeding supplement (PRO-STAT SUGAR FREE 64)  30 mL Oral BID  . ferrous [id number] C-folic acid  1 capsule Oral BID PC  . gabapentin  300 mg Oral Q breakfast  . guaiFENesin  600 mg Oral BID  . insulin aspart  0-24 Units Subcutaneous TID AC & HS  . insulin detemir  20 Units Subcutaneous Daily  . levothyroxine  50 mcg Oral Q0600  . magic mouthwash  15 mL Oral TID  . mouth rinse  15 mL Mouth Rinse BID  . pantoprazole  40 mg Oral Daily  . polyethylene glycol  17 g Oral Once  . sodium chloride flush  3 mL Intravenous Q12H   Continuous Infusions: . sodium chloride Stopped (09/27/19 1005)  . sodium chloride 250 mL (09/27/19 1731)  . sodium chloride 20 mL/hr at 09/27/19 0200  . cefTRIAXone (ROCEPHIN)  IV Stopped (09/30/19 [id number])  . DOPamine Stopped (10/01/19 0223)  . lactated ringers 10 mL/hr at 10/01/19 0700  . phenylephrine (NEO-SYNEPHRINE) Adult infusion     PRN Meds: sodium chloride, dextrose, levalbuterol, morphine injection, ondansetron (ZOFRAN) IV, oxyCODONE, sodium chloride flush, sodium chloride flush, traMADol   Vital Signs    Vitals:   10/01/19 0500 10/01/19  0600 10/01/19 0700 10/01/19 0801  BP: 102/64 116/76 131/81   Pulse: 93 94 79   Resp: 15 16 (!) 21   Temp:    (!) 97.5 F (36.4 C)  TempSrc:    Oral  SpO2: 100% 100% 100%   Weight: (!) 139.9 kg     Height:        Intake/Output Summary (Last 24 hours) at 10/01/2019 [id number] Last data filed at 10/01/2019 0700 Gross per 24 hour  Intake 666.34 ml  Output 2831 ml  Net -2164.66 ml   Filed Weights   09/29/19 0630 09/30/19 0600 10/01/19 0500  Weight: (!) 139.7 kg 109.2 kg (!) 139.9 kg    Telemetry    NSR 10/01/2019   ECG    AFib RBBB 09/28/19   Physical Exam  Affect appropriate Overweight black male  HEENT: normal Neck supple with no adenopathy JVP normal no bruits no thyromegaly Lungs clear with no wheezing and good diaphragmatic motion Heart:  S1/S2 SEM  murmur, no rub, gallop or click PMI normal post sternotomy  Abdomen: benighn, BS positve, no tenderness, no AAA no bruit.  No HSM or HJR Distal pulses intact with no bruits No edema Neuro non-focal Skin warm and dry No muscular  weakness   Labs    Chemistry Recent Labs  Lab 09/26/19 [id number] 09/26/19 [id number] 09/28/19 0430 09/28/19 [id number] 09/29/19 0308 09/29/19 0430 09/30/19 0408 09/30/19 1215 10/01/19 0519  NA 140   < > 143   < > 141   < > 140 138 140  K 3.9   < > 5.0   < > 4.6   < > 4.6 4.4 4.5  CL 107   < > 110   < > 108   < > 107 106 106  CO2 23   < > 21*   < > 20*   < > 23 21* 24  GLUCOSE 146*   < > 158*   < > 154*   < > 156* 166* 97  BUN 14   < > 27*   < > 37*   < > 43* 40* 39*  CREATININE 1.23   < > 3.58*   < > 4.46*   < > 4.24* 3.99* 3.68*  CALCIUM 8.7*   < > 8.4*   < > 8.1*   < > 8.1* 7.8* 8.2*  PROT 7.1  --  5.4*  --  5.1*  --   --   --   --   ALBUMIN 2.7*   < > 2.6*   < > 2.2*   < > 2.2* 2.2* 2.2*  AST 37  --  40  --  31  --   --   --   --   ALT 25  --  17  --  17  --   --   --   --   ALKPHOS 73  --  42  --  48  --   --   --   --   BILITOT 0.8  --  2.7*  --  3.2*  --   --   --   --   GFRNONAA >60   < >  18*   < > 14*   < > 15* 16* 18*  GFRAA >60   < > 21*   < > 16*   < > 17* 18* 20*  ANIONGAP 10   < > 12   < > 13   < > 10 11 10    < > = values in this interval not displayed.     Hematology Recent Labs  Lab 09/29/19 0426 09/29/19 0426 09/29/19 0430 09/30/19 0408 10/01/19 0519  WBC 9.2  --   --  7.0 6.2  RBC 2.70*  --   --  2.58* 2.57*  HGB 7.8*   < > 7.8* 7.3* 7.5*  HCT 23.5*   < > 23.0* 22.6* 22.4*  MCV 87.0  --   --  87.6 87.2  MCH 28.9  --   --  28.3 29.2  MCHC 33.2  --   --  32.3 33.5  RDW 17.0*  --   --  16.9* 16.7*  PLT 73*  --   --  77* 89*   < > = values in this interval not displayed.     Radiology    DG Chest Port 1 View  Result Date: 09/30/2019 CLINICAL DATA:  Postoperative median sternotomy. Recent aortic valve replacement EXAM: PORTABLE CHEST 1 VIEW COMPARISON:  Sep 29, 2019 FINDINGS: Swan-Ganz catheter has been removed. Cordis tip is in the superior vena cava. There is a left chest tube. Mediastinal drain no longer evident. No pneumothorax. There is no edema or airspace opacity. There is persistent cardiac enlargement.  The pulmonary vascularity is normal. No adenopathy. No bone lesions evident. IMPRESSION: Stable cardiomegaly.  Lungs clear.  No pneumothorax. Electronically Signed   By: Lowella Grip III M.D.   On: 09/30/2019 08:11    Cardiac Studies   none  Patient Profile     54 y.o. male admitted with endocarditis and s/p AVR  Assessment & Plan    1. PAF - in NSR this am stable  2. POD# 4 s/p AVR - no diastolic murmur SEM through valve homograft will need baseline echo after first office visit 3. Volume overload - improving over 2 Liter diuresis not on standing lasix      For questions or updates, please contact [facility] Please consult [email or web address] for contact info under Cardiology/STEMI.      Signed, Jenkins Rouge, MD  10/01/2019, 8:08 AM  Patient ID: [name], male   DOB: Dec 19, 1965, 54 y.o.   MRN: [id number]

## 2019-10-01 NOTE — Evaluation (Signed)
Physical Therapy Evaluation Patient Details Name: [name] MRN: [id number] DOB: [birth year] Today's Date: 10/01/2019   History of Present Illness  54 yo admitted with endocarditis s/p redo aortic root replacement with CABG x 3. PMhx: L3-4 lami, CVA, aortic valve replacement, DM, CHF, CAD,HTN, HLD, cABG  Clinical Impression  Pt in recliner on arrival, agreeable to gait and request for return to bed. Pt educated for all sternal precautions and mobility progression. Pt with HR 94 sitting with rise to 140 with 200' of gait. Standing pause allowed return to 112-120 but after additional 100' pt with rise and maintain at 140 with gait ceased. Pt unaware of rising HR. Pt with decreased transfers, gait and activity tolerance who will benefit from acute therapy to maximize mobility, safety and function to decrease burden of care.  Pt on 4L on arrival and able to wean to 2L with SpO2 >97% Pre BP 109/74 (86) Post 123/76 (91)    Follow Up Recommendations No PT follow up    Equipment Recommendations  Other (comment)(TBD)    Recommendations for Other Services       Precautions / Restrictions Precautions Precautions: Sternal Precaution Booklet Issued: No Precaution Comments: educated pt for sternal precautions      Mobility  Bed Mobility Overal bed mobility: Needs Assistance Bed Mobility: Sit to Supine       Sit to supine: Min assist   General bed mobility comments: assist to bring legs onto surface with cues for sequence and precautions  Transfers Overall transfer level: Needs assistance   Transfers: Sit to/from Stand Sit to Stand: Min guard         General transfer comment: cues for sequence and precautions pt with tendency to try to push through elbows to scoot x 2 trials from recliner  Ambulation/Gait Ambulation/Gait assistance: Min guard;+2 safety/equipment Gait Distance (Feet): 300 Feet Assistive device: Bilateral platform walker Gait Pattern/deviations:  Step-through pattern;Decreased stride length   Gait velocity interpretation: >2.62 ft/sec, indicative of community ambulatory General Gait Details: cues for posture and position in EVA walker due to no oxygen carrier on unit. Pt reports no reliance on equipment for gait. pt with HR up to 140 with gait with standing rest initially able to return to 112 but after 300' rise to 140 sustained and pt seated in chair for return to room  Stairs            Wheelchair Mobility    Modified Rankin (Stroke Patients Only)       Balance Overall balance assessment: No apparent balance deficits (not formally assessed)                                           Pertinent Vitals/Pain Pain Assessment: 0-10 Pain Score: 3  Pain Location: chest Pain Descriptors / Indicators: Sore Pain Intervention(s): Limited activity within patient's tolerance;Repositioned    Home Living Family/patient expects to be discharged to:: Private residence Living Arrangements: Spouse/significant other Available Help at Discharge: Family;Available 24 hours/day Type of Home: House Home Access: Level entry     Home Layout: Two level Home Equipment: None      Prior Function Level of Independence: Independent         Comments: 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 [facility]     Hand Dominance        Extremity/Trunk Assessment   Upper Extremity Assessment Upper Extremity Assessment: Overall  WFL for tasks assessed    Lower Extremity Assessment Lower Extremity Assessment: Overall WFL for tasks assessed    Cervical / Trunk Assessment Cervical / Trunk Assessment: Normal  Communication   Communication: No difficulties  Cognition Arousal/Alertness: Awake/alert Behavior During Therapy: WFL for tasks assessed/performed Overall Cognitive Status: Within Functional Limits for tasks assessed                                        General Comments      Exercises      Assessment/Plan    PT Assessment Patient needs continued PT services  PT Problem List Decreased mobility;Decreased activity tolerance;Decreased knowledge of precautions;Cardiopulmonary status limiting activity       PT Treatment Interventions Gait training;Stair training;Functional mobility training;Therapeutic activities;Patient/family education;DME instruction;Therapeutic exercise    PT Goals (Current goals can be found in the Care Plan section)  Acute Rehab PT Goals Patient Stated Goal: return to football PT Goal Formulation: With patient Time For Goal Achievement: 10/15/19 Potential to Achieve Goals: Good    Frequency Min 3X/week   Barriers to discharge        Co-evaluation               AM-PAC PT "6 Clicks" Mobility  Outcome Measure Help needed turning from your back to your side while in a flat bed without using bedrails?: A Little Help needed moving from lying on your back to sitting on the side of a flat bed without using bedrails?: A Little Help needed moving to and from a bed to a chair (including a wheelchair)?: A Little Help needed standing up from a chair using your arms (e.g., wheelchair or bedside chair)?: A Little Help needed to walk in hospital room?: A Little Help needed climbing 3-5 steps with a railing? : A Lot 6 Click Score: 17    End of Session Equipment Utilized During Treatment: Gait belt Activity Tolerance: Patient tolerated treatment well Patient left: in bed;with call bell/phone within reach Nurse Communication: Mobility status;Precautions PT Visit Diagnosis: Other abnormalities of gait and mobility (R26.89)    Time: [id number] PT Time Calculation (min) (ACUTE ONLY): 29 min   Charges:   PT Evaluation $PT Eval Moderate Complexity: 1 Mod PT Treatments $Gait Training: 8-22 mins        Tovia Kisner P, PT Acute Rehabilitation Services Pager: [phone] Office: [place] 10/01/2019, 1:21 PM

## 2019-10-01 NOTE — Plan of Care (Signed)
  Problem: Activity: Goal: Risk for activity intolerance will decrease Outcome: Progressing Note: Pt ambulating with RN and PT, but requires motivating to complete ambulation. During ambulation w PT heart rate increased to 140's and session was stopped.    Problem: Nutrition: Goal: Adequate nutrition will be maintained Outcome: Progressing Note: Pt. Repeatedly refuses PRO STAT but does drink Ensure. Pt. Has increased meal intake. PRO STAT order discontinued due to repeated pt. Refusal.

## 2019-10-01 NOTE — Progress Notes (Addendum)
TCTS DAILY ICU PROGRESS NOTE                   [address]            [place] [address]          [phone]   5 Days Post-Op Procedure(s) (LRB): REDO STERNOTOMY (N/A) REDO ASCENDING AORTIC ROOT REPLACEMENT USING LIFENET 25 MM CRYO, AORTIC VALVE HOMOGRAFT AND 30 MM HEMASHIELD PLATINUM GRAFT (N/A) CORONARY ARTERY BYPASS GRAFTING (CABG), ON PUMP, TIMES THREE, USING ENDOSCOPICALLY HARVESTED LEFT GREATER SAPHENOUS VEIN (N/A) TRANSESOPHAGEAL ECHOCARDIOGRAM (TEE) (N/A)  Total Length of Stay:  LOS: 23 days   Subjective: Patient not sleeping well. He is passing flatus but no bowel movement yet.  Objective: Vital signs in last 24 hours: Temp:  [97.5 F (36.4 C)-98.5 F (36.9 C)] 98.5 F (36.9 C) (05/11 0400) Pulse Rate:  [92-103] 94 (05/11 0600) Cardiac Rhythm: Normal sinus rhythm (05/11 0400) Resp:  [10-24] 16 (05/11 0600) BP: (90-121)/(60-88) 116/76 (05/11 0600) SpO2:  [93 %-100 %] 100 % (05/11 0600) Arterial Line BP: (109-134)/(55-67) 109/60 (05/10 1300) Weight:  [139.9 kg] 139.9 kg (05/11 0500)  Filed Weights   09/29/19 0630 09/30/19 0600 10/01/19 0500  Weight: (!) 139.7 kg 109.2 kg (!) 139.9 kg    Weight change: 30.7 kg   Hemodynamic parameters for last 24 hours:    Intake/Output from previous day: 05/10 0701 - 05/11 0700 In: 698 [P.O.:240; I.V.:358; IV Piggyback:100] Out: 2831 [Urine:2831]  Intake/Output this shift: No intake/output data recorded.  Current Meds: Scheduled Meds: . sodium chloride   Intravenous Once  . acetaminophen  1,000 mg Oral Q6H   Or  . acetaminophen (TYLENOL) oral liquid 160 mg/5 mL  1,000 mg Per Tube Q6H  . amiodarone  400 mg Oral BID  . aspirin EC  325 mg Oral Daily   Or  . aspirin  324 mg Per Tube Daily  . atorvastatin  40 mg Oral Q breakfast  . bisacodyl  10 mg Oral Daily   Or  . bisacodyl  10 mg Rectal Daily  . Chlorhexidine Gluconate Cloth  6 each Topical Daily  . docusate sodium  200 mg Oral Daily  .  feeding supplement  1 Container Oral BID BM  . feeding supplement (ENSURE ENLIVE)  237 mL Oral BID BM  . feeding supplement (PRO-STAT SUGAR FREE 64)  30 mL Oral BID  . ferrous Q000111Q C-folic acid  1 capsule Oral BID PC  . gabapentin  300 mg Oral Q breakfast  . insulin aspart  0-24 Units Subcutaneous Q4H  . insulin detemir  20 Units Subcutaneous Daily  . levothyroxine  50 mcg Oral Q0600  . magic mouthwash  15 mL Oral TID  . mouth rinse  15 mL Mouth Rinse BID  . pantoprazole  40 mg Oral Daily  . sodium chloride flush  3 mL Intravenous Q12H   Continuous Infusions: . sodium chloride Stopped (09/27/19 1005)  . sodium chloride 250 mL (09/27/19 1731)  . sodium chloride 20 mL/hr at 09/27/19 0200  . cefTRIAXone (ROCEPHIN)  IV Stopped (09/30/19 [id number])  . DOPamine Stopped (10/01/19 0223)  . lactated ringers 10 mL/hr at 10/01/19 0700  . phenylephrine (NEO-SYNEPHRINE) Adult infusion     PRN Meds:.sodium chloride, dextrose, levalbuterol, morphine injection, ondansetron (ZOFRAN) IV, oxyCODONE, sodium chloride flush, sodium chloride flush, traMADol  General appearance: alert, cooperative, no distress and moderately obese Neurologic: intact Heart: RRR Lungs: Diminished bibasilar breath sounds Abdomen: Soft, non tender,  sporadic bowel sounds Extremities: SCDs in place Wound: Sternal wound is clean and dry.   Lab Results: CBC: Recent Labs    09/30/19 0408 10/01/19 0519  WBC 7.0 6.2  HGB 7.3* 7.5*  HCT 22.6* 22.4*  PLT 77* 89*   BMET:  Recent Labs    09/30/19 1215 10/01/19 0519  NA 138 140  K 4.4 4.5  CL 106 106  CO2 21* 24  GLUCOSE 166* 97  BUN 40* 39*  CREATININE 3.99* 3.68*  CALCIUM 7.8* 8.2*    CMET: Lab Results  Component Value Date   WBC 6.2 10/01/2019   HGB 7.5 (L) 10/01/2019   HCT 22.4 (L) 10/01/2019   PLT 89 (L) 10/01/2019   GLUCOSE 97 10/01/2019   CHOL 132 04/02/2019   TRIG 123.0 04/02/2019   HDL 35.00 (L) 04/02/2019   LDLDIRECT 50.0 10/02/2018     LDLCALC 73 04/02/2019   ALT 17 09/29/2019   AST 31 09/29/2019   NA 140 10/01/2019   K 4.5 10/01/2019   CL 106 10/01/2019   CREATININE 3.68 (H) 10/01/2019   BUN 39 (H) 10/01/2019   CO2 24 10/01/2019   TSH 0.977 09/29/2019   PSA 1.04 10/02/2018   INR 1.0 09/27/2019   HGBA1C 7.3 (H) 09/26/2019   MICROALBUR 3.6 (H) 08/31/2017    PT/INR: No results for input(s): LABPROT, INR in the last 72 hours. Radiology: No results found.   Assessment/Plan: S/P Procedure(s) (LRB): REDO STERNOTOMY (N/A) REDO ASCENDING AORTIC ROOT REPLACEMENT USING LIFENET 25 MM CRYO, AORTIC VALVE HOMOGRAFT AND 30 MM HEMASHIELD PLATINUM GRAFT (N/A) CORONARY ARTERY BYPASS GRAFTING (CABG), ON PUMP, TIMES THREE, USING ENDOSCOPICALLY HARVESTED LEFT GREATER SAPHENOUS VEIN (N/A) TRANSESOPHAGEAL ECHOCARDIOGRAM (TEE) (N/A)   1. CV-He went into a fib with RVR the evening of 05/08. SR,ST at times with HR in the low 100's. On Dopamine, Amiodarone 400 mg bid. Hope to start BB soon. 2. Pulmonary-on 3 liters of oxygen via [place].  CXR this am appears to show cardiomegaly, no pneumothorax. Encourage incentive spirometer. Mucinex for cought/sputum 3. Volume overload-not on diuretic secondary to AKI 4. DM-CBGs 133/127/78. On Insulin 20 units [place] daily. Pre op HGA1C 7.3. 5. ID-on Ceftriaxone for Strep bacteremia (Gentamycin discontinued secondary to AKI). Awaiting final cultures;no growth to date 6. Expected post op blood loss anemia-H and H this am remains  7.5 and 22.4. On Trinsicon 7. AKI-Possible etiology ATN. Creatinine slightly decreased to 3.68 8. Thrombocytopenia-platelets this am slightly increased to 89,000 9. 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 LOC constipation and Benadryl for sleep 12. Once off Dopamine drip, will transfer  Nani Skillern PA-C 10/01/2019 7:01 AM     Chart reviewed, patient examined, agree with  above. He feels better every day and ambulated 300 ft with PT today. Good urine output and -2133 cc yesterday. Creat continues to drop. Will continue dopamine 2 today and if creat improved again tomorrow will stop and transfer to 4E.

## 2019-10-01 NOTE — Progress Notes (Signed)
Subjective:  Hemodynamically stable just on a little dopa-  2.6 liters of UOP- crt down again -  Sitting up eating breakfast  Objective Vital signs in last 24 hours: Vitals:   10/01/19 0200 10/01/19 0300 10/01/19 0400 10/01/19 0500  BP: 107/88 90/66 109/67 102/64  Pulse: 95 92 93 93  Resp: (!) 24 17 17 15   Temp:   98.5 F (36.9 C)   TempSrc:   Oral   SpO2: 99% 99% 98% 100%  Weight:    (!) 139.9 kg  Height:       Weight change: 30.7 kg  Intake/Output Summary (Last 24 hours) at 10/01/2019 0656 Last data filed at 10/01/2019 0400 Gross per 24 hour  Intake 939.3 ml  Output 2636 ml  Net -1696.7 ml    Assessment/ Plan: Pt is a 54 y.o. yo male DM, HTN, CAD and Afib, AAA who was admitted on 09/08/2019 with tricuspid valve endocarditis- s/p operative management complicated by AKI (crt normal on 5/6), new dx of plasmacytoma  Assessment/Plan: 1. Endocarditis-  S/p operative repair including CABG and replacement of aortic root. Per CTS- rocephin 2. Renal-  True AKI-  crt normal on 5/6.  Feel like is ATN due to above.   Nonoliguric, no indications for dialysis-  crt actually down again- cont to watch  3. Anemia-  Due to surgical intervention and AKI--  Transfuse as needed   4. HTN/volume-  Overloaded but minimal O2 req- 2 liters negative last 24 hours no diuretics.  I dont think needs for diuretics today-  Should cont to diureses some on his own due to recovery 5.  Plasmacytoma-  11% plasma cells in bone marrow-  Allowing to recover from surgery and consider chemo/rediation at a later date  [place]: Basic Metabolic Panel: Recent Labs  Lab 09/30/19 0408 09/30/19 1215 10/01/19 0519  NA 140 138 140  K 4.6 4.4 4.5  CL 107 106 106  CO2 23 21* 24  GLUCOSE 156* 166* 97  BUN 43* 40* 39*  CREATININE 4.24* 3.99* 3.68*  CALCIUM 8.1* 7.8* 8.2*  PHOS 6.5* 5.2* 5.0*   Liver Function Tests: Recent Labs  Lab 09/26/19 0458 09/26/19 0458 09/28/19 0430 09/28/19 0430  09/29/19 0308 09/29/19 0308 09/30/19 0408 09/30/19 1215 10/01/19 0519  AST 37  --  40  --  31  --   --   --   --   ALT 25  --  17  --  17  --   --   --   --   ALKPHOS 73  --  42  --  48  --   --   --   --   BILITOT 0.8  --  2.7*  --  3.2*  --   --   --   --   PROT 7.1  --  5.4*  --  5.1*  --   --   --   --   ALBUMIN 2.7*   < > 2.6*   < > 2.2*   < > 2.2* 2.2* 2.2*   < > = values in this interval not displayed.   No results for input(s): LIPASE, AMYLASE in the last 168 hours. No results for input(s): AMMONIA in the last 168 hours. CBC: Recent Labs  Lab 09/25/19 0533 09/26/19 0458 09/28/19 0430 09/28/19 [id number] 09/28/19 1911 09/28/19 1911 09/29/19 0426 09/29/19 0426 09/29/19 0430 09/30/19 0408 10/01/19 0519  WBC 5.4   < > 15.5*   < >  13.1*   < > 9.2  --   --  7.0 6.2  NEUTROABS 3.3  --   --   --   --   --   --   --   --   --   --   HGB 8.5*   < > 8.9*   < > 8.3*   < > 7.8*   < > 7.8* 7.3* 7.5*  HCT 24.7*   < > 26.6*   < > 24.8*   < > 23.5*   < > 23.0* 22.6* 22.4*  MCV 85.5   < > 86.1  --  88.3  --  87.0  --   --  87.6 87.2  PLT 151   < > 84*   < > 76*   < > 73*  --   --  77* 89*   < > = values in this interval not displayed.   Cardiac Enzymes: No results for input(s): CKTOTAL, CKMB, CKMBINDEX, TROPONINI in the last 168 hours. CBG: Recent Labs  Lab 09/30/19 1145 09/30/19 1602 09/30/19 2039 10/01/19 0048 10/01/19 0403  GLUCAP 110* 143* 133* 127* 78    Iron Studies: No results for input(s): IRON, TIBC, TRANSFERRIN, FERRITIN in the last 72 hours. Studies/Results: DG Chest Port 1 View  Result Date: 09/30/2019 CLINICAL DATA:  Postoperative median sternotomy. Recent aortic valve replacement EXAM: PORTABLE CHEST 1 VIEW COMPARISON:  Sep 29, 2019 FINDINGS: Swan-Ganz catheter has been removed. Cordis tip is in the superior vena cava. There is a left chest tube. Mediastinal drain no longer evident. No pneumothorax. There is no edema or airspace opacity. There is persistent cardiac  enlargement. The pulmonary vascularity is normal. No adenopathy. No bone lesions evident. IMPRESSION: Stable cardiomegaly.  Lungs clear.  No pneumothorax. Electronically Signed   By: Lowella Grip III M.D.   On: 09/30/2019 08:11   Medications: Infusions: . sodium chloride Stopped (09/27/19 1005)  . sodium chloride 250 mL (09/27/19 1731)  . sodium chloride 20 mL/hr at 09/27/19 0200  . cefTRIAXone (ROCEPHIN)  IV Stopped (09/30/19 [id number])  . DOPamine Stopped (10/01/19 0223)  . lactated ringers 10 mL/hr at 10/01/19 0400  . phenylephrine (NEO-SYNEPHRINE) Adult infusion      Scheduled Medications: . sodium chloride   Intravenous Once  . acetaminophen  1,000 mg Oral Q6H   Or  . acetaminophen (TYLENOL) oral liquid 160 mg/5 mL  1,000 mg Per Tube Q6H  . amiodarone  400 mg Oral BID  . aspirin EC  325 mg Oral Daily   Or  . aspirin  324 mg Per Tube Daily  . atorvastatin  40 mg Oral Q breakfast  . bisacodyl  10 mg Oral Daily   Or  . bisacodyl  10 mg Rectal Daily  . Chlorhexidine Gluconate Cloth  6 each Topical Daily  . docusate sodium  200 mg Oral Daily  . feeding supplement  1 Container Oral BID BM  . feeding supplement (ENSURE ENLIVE)  237 mL Oral BID BM  . feeding supplement (PRO-STAT SUGAR FREE 64)  30 mL Oral BID  . ferrous Q000111Q C-folic acid  1 capsule Oral BID PC  . gabapentin  300 mg Oral Q breakfast  . insulin aspart  0-24 Units Subcutaneous Q4H  . insulin detemir  20 Units Subcutaneous Daily  . levothyroxine  50 mcg Oral Q0600  . magic mouthwash  15 mL Oral TID  . mouth rinse  15 mL Mouth Rinse BID  . pantoprazole  40 mg  Oral Daily  . sodium chloride flush  3 mL Intravenous Q12H    have reviewed scheduled and prn medications.  Physical Exam: General: sitting up-  looks pretty good Heart: RRR Lungs: dec BS at bases-  CT in place Abdomen: distended-   Extremities: mild edema    10/01/2019,6:56 AM  LOS: 23 days

## 2019-10-02 ENCOUNTER — Inpatient Hospital Stay (HOSPITAL_COMMUNITY): Payer: Medicare PPO

## 2019-10-02 LAB — CBC
HCT: 21.8 % — ABNORMAL LOW (ref 39.0–52.0)
HCT: 23.9 % — ABNORMAL LOW (ref 39.0–52.0)
Hemoglobin: 7.2 g/dL — ABNORMAL LOW (ref 13.0–17.0)
Hemoglobin: 7.9 g/dL — ABNORMAL LOW (ref 13.0–17.0)
MCH: 28.6 pg (ref 26.0–34.0)
MCH: 28.9 pg (ref 26.0–34.0)
MCHC: 33 g/dL (ref 30.0–36.0)
MCHC: 33.1 g/dL (ref 30.0–36.0)
MCV: 86.5 fL (ref 80.0–100.0)
MCV: 87.5 fL (ref 80.0–100.0)
Platelets: 107 10*3/uL — ABNORMAL LOW (ref 150–400)
Platelets: 124 10*3/uL — ABNORMAL LOW (ref 150–400)
RBC: 2.52 MIL/uL — ABNORMAL LOW (ref 4.22–5.81)
RBC: 2.73 MIL/uL — ABNORMAL LOW (ref 4.22–5.81)
RDW: 16.4 % — ABNORMAL HIGH (ref 11.5–15.5)
RDW: 16.3 % — ABNORMAL HIGH (ref 11.5–15.5)
WBC: 6.6 10*3/uL (ref 4.0–10.5)
WBC: 7.4 10*3/uL (ref 4.0–10.5)
nRBC: 0 % (ref 0.0–0.2)
nRBC: 0 % (ref 0.0–0.2)

## 2019-10-02 LAB — GLUCOSE, CAPILLARY
Glucose-Capillary: 78 mg/dL (ref 70–99)
Glucose-Capillary: 157 mg/dL — ABNORMAL HIGH (ref 70–99)
Glucose-Capillary: 195 mg/dL — ABNORMAL HIGH (ref 70–99)
Glucose-Capillary: 178 mg/dL — ABNORMAL HIGH (ref 70–99)

## 2019-10-02 LAB — RENAL FUNCTION PANEL
Albumin: 2.2 g/dL — ABNORMAL LOW (ref 3.5–5.0)
Anion gap: 9 (ref 5–15)
BUN: 34 mg/dL — ABNORMAL HIGH (ref 6–20)
CO2: 25 mmol/L (ref 22–32)
Calcium: 8.3 mg/dL — ABNORMAL LOW (ref 8.9–10.3)
Chloride: 105 mmol/L (ref 98–111)
Creatinine, Ser: 3.08 mg/dL — ABNORMAL HIGH (ref 0.61–1.24)
GFR calc Af Amer: 25 mL/min — ABNORMAL LOW (ref >60)
GFR calc non Af Amer: 22 mL/min — ABNORMAL LOW (ref >60)
Glucose, Bld: 103 mg/dL — ABNORMAL HIGH (ref 70–99)
Phosphorus: 3.3 mg/dL (ref 2.5–4.6)
Potassium: 4.5 mmol/L (ref 3.5–5.1)
Sodium: 139 mmol/L (ref 135–145)

## 2019-10-02 LAB — PREPARE RBC (CROSSMATCH)

## 2019-10-02 IMAGING — DX DG CHEST 1V PORT
1 series · 1 of 1 positions shown · non-contrast
Comparison: Yesterday

CLINICAL DATA: Pleural effusion

EXAM:
PORTABLE CHEST 1 VIEW

[chest ap]
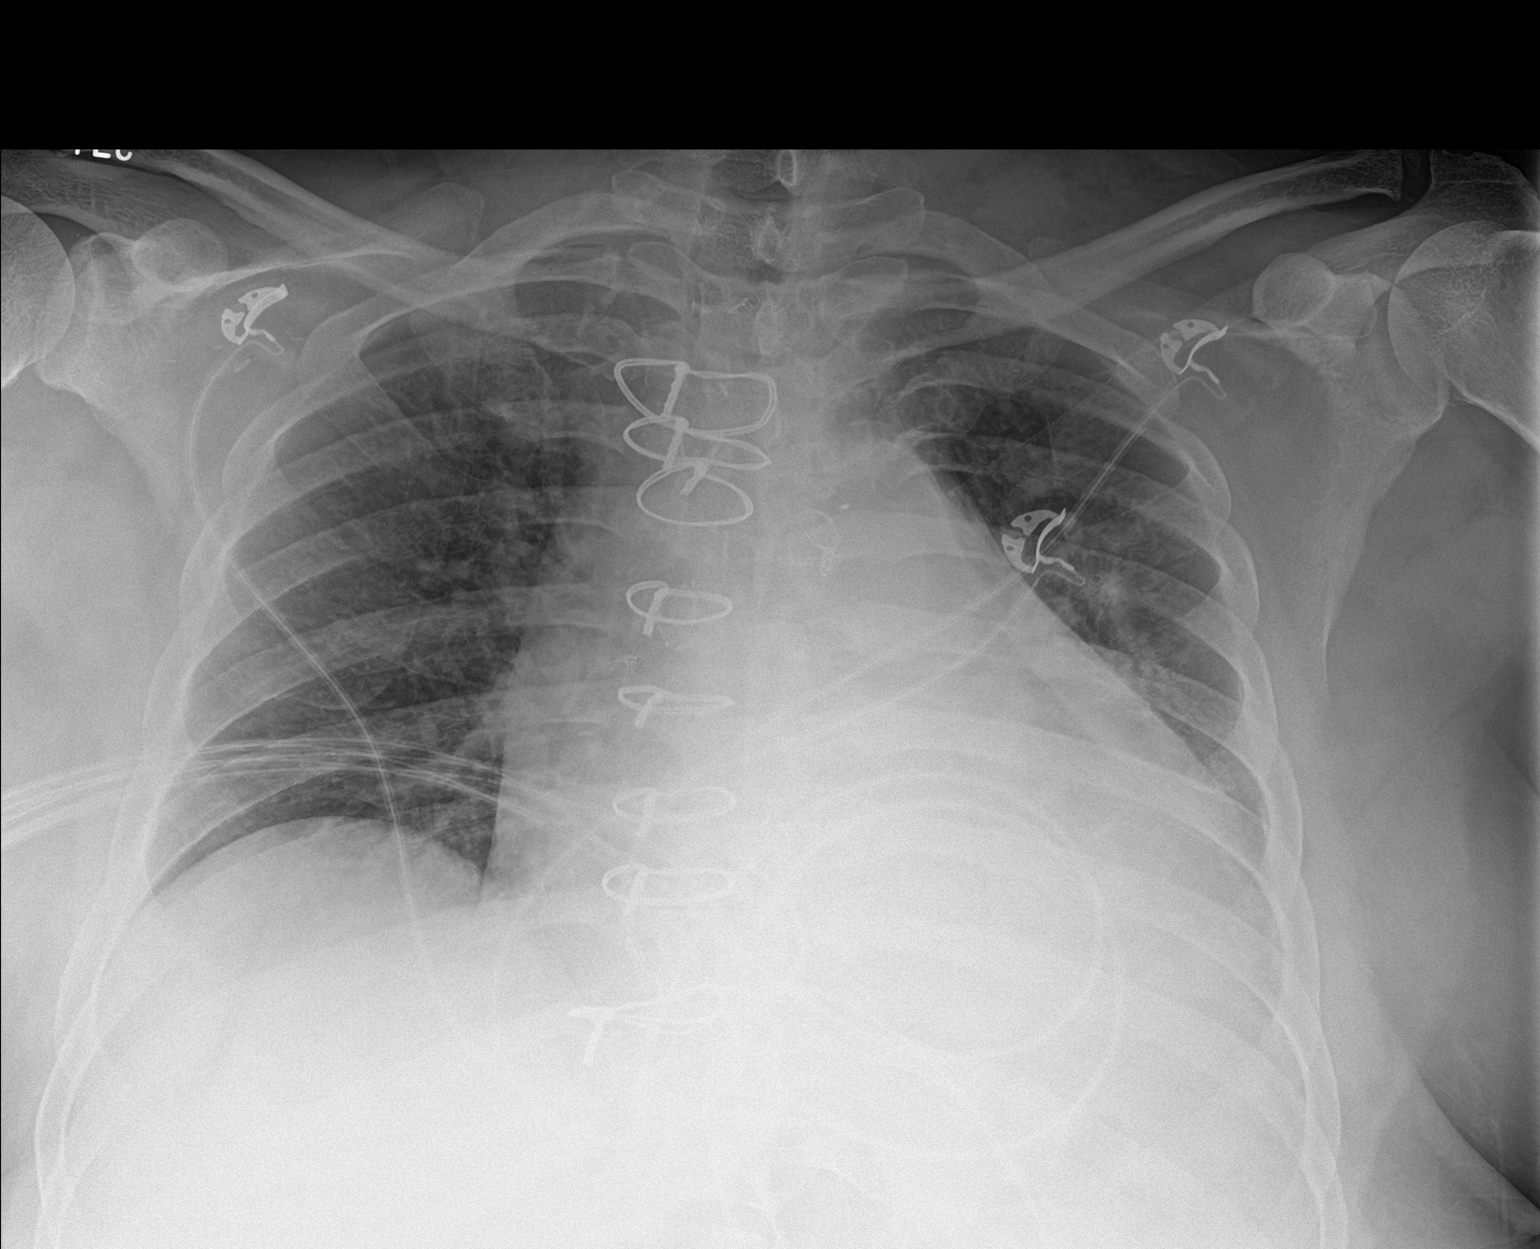

[1 of 1 positions shown; findings below may reference images not displayed]

FINDINGS: Cardiomegaly. There has been CABG. Low volume chest and soft tissue
attenuation limits visualization of the retrocardiac lung, unchanged
and not definitely opacified. No edema, effusion, or pneumothorax.
IMPRESSION: Stable low volume chest with cardiomegaly.

## 2019-10-02 MED ORDER — ~~LOC~~ CARDIAC SURGERY, PATIENT & FAMILY EDUCATION: Freq: Once | Status: AC

## 2019-10-02 MED ORDER — SODIUM CHLORIDE 0.9% FLUSH: 3.0000 mL | INTRAVENOUS | Status: DC | PRN

## 2019-10-02 MED ORDER — SODIUM CHLORIDE 0.9% IV SOLUTION
Freq: Once | INTRAVENOUS | Status: AC
  Administered 2019-10-02: 08:00:00 1000 mL via INTRAVENOUS

## 2019-10-02 MED ORDER — SODIUM CHLORIDE 0.9% FLUSH
3.0000 mL | Freq: Two times a day (BID) | INTRAVENOUS | Status: DC
  Administered 2019-10-02 – 2019-10-04 (×5): 3 mL via INTRAVENOUS

## 2019-10-02 MED ORDER — SODIUM CHLORIDE 0.9 % IV SOLN: 250.0000 mL | INTRAVENOUS | Status: DC | PRN

## 2019-10-02 NOTE — Progress Notes (Signed)
CARDIAC REHAB PHASE I   Offered to walk with pt. Pt just transferred from [facility] and walked twice today. Reviewed importance of sternal precautions, walks, and IS use. Pt able to demonstrate ~500 on IS where he was getting to 1750 prior to surgery. Pt requesting rolling walker and BSC for home use. CM made aware. Encouraged pt to sit in chair for meals and shoot for 3 walks a day. Will continue to follow and encourage ambulation.  CN:[id number] Rufina Falco, RN BSN 10/02/2019 2:43 PM

## 2019-10-02 NOTE — Progress Notes (Signed)
Pt arrived to rm 16 from [place]. Inititated tele. Pt received CHG bath. Assessment performed. VSS. Call bell within reach.   Lavenia Atlas, RN

## 2019-10-02 NOTE — Progress Notes (Addendum)
TCTS DAILY ICU PROGRESS NOTE                   [address] [place]            [place] [address]          [phone]   6 Days Post-Op Procedure(s) (LRB): REDO STERNOTOMY (N/A) REDO ASCENDING AORTIC ROOT REPLACEMENT USING LIFENET 25 MM CRYO, AORTIC VALVE HOMOGRAFT AND 30 MM HEMASHIELD PLATINUM GRAFT (N/A) CORONARY ARTERY BYPASS GRAFTING (CABG), ON PUMP, TIMES THREE, USING ENDOSCOPICALLY HARVESTED LEFT GREATER SAPHENOUS VEIN (N/A) TRANSESOPHAGEAL ECHOCARDIOGRAM (TEE) (N/A)  Total Length of Stay:  LOS: 24 days   Subjective: Patient slept better with Benadryl, had a bowel movement, and hopes to move upstairs  Objective: Vital signs in last 24 hours: Temp:  [97.5 F (36.4 C)-98.5 F (36.9 C)] 97.9 F (36.6 C) (05/12 0350) Pulse Rate:  [63-106] 63 (05/12 0700) Cardiac Rhythm: Normal sinus rhythm (05/11 2000) Resp:  [12-26] 21 (05/12 0700) BP: (93-128)/(65-100) 106/87 (05/12 0600) SpO2:  [94 %-100 %] 97 % (05/12 0700) Weight:  [140.3 kg] 140.3 kg (05/12 0500)  Filed Weights   09/30/19 0600 10/01/19 0500 10/02/19 0500  Weight: 109.2 kg (!) 139.9 kg (!) 140.3 kg    Weight change: 0.4 kg   Hemodynamic parameters for last 24 hours:    Intake/Output from previous day: 05/11 0701 - 05/12 0700 In: 1104.5 [P.O.:720; I.V.:271.8; IV Piggyback:112.7] Out: 1500 [Urine:1500]  Intake/Output this shift: No intake/output data recorded.  Current Meds: Scheduled Meds: . sodium chloride   Intravenous Once  . amiodarone  400 mg Oral BID  . aspirin EC  325 mg Oral Daily   Or  . aspirin  324 mg Per Tube Daily  . atorvastatin  40 mg Oral Q breakfast  . bisacodyl  10 mg Oral Daily   Or  . bisacodyl  10 mg Rectal Daily  . Chlorhexidine Gluconate Cloth  6 each Topical Daily  . diphenhydrAMINE  25 mg Oral QHS  . docusate sodium  200 mg Oral Daily  . feeding supplement  1 Container Oral BID BM  . feeding supplement (ENSURE ENLIVE)  237 mL Oral BID BM  . ferrous  Q000111Q C-folic acid  1 capsule Oral BID PC  . gabapentin  300 mg Oral Q breakfast  . guaiFENesin  600 mg Oral BID  . insulin aspart  0-24 Units Subcutaneous TID AC & HS  . insulin detemir  20 Units Subcutaneous Daily  . levothyroxine  50 mcg Oral Q0600  . magic mouthwash  15 mL Oral TID  . mouth rinse  15 mL Mouth Rinse BID  . pantoprazole  40 mg Oral Daily  . sodium chloride flush  3 mL Intravenous Q12H   Continuous Infusions: . sodium chloride 20 mL/hr at 09/27/19 0200  . cefTRIAXone (ROCEPHIN)  IV Stopped (10/01/19 1136)  . DOPamine 2 mcg/kg/min (10/02/19 0700)  . lactated ringers Stopped (10/01/19 2306)   PRN Meds:.dextrose, levalbuterol, morphine injection, ondansetron (ZOFRAN) IV, oxyCODONE, sodium chloride flush, sodium chloride flush, traMADol  General appearance: alert, cooperative, no distress and moderately obese Neurologic: intact Heart: IRRR Lungs: Slightly diminished bibasilar breath sounds Abdomen: Soft, non tender, sporadic bowel sounds Extremities: SCDs in place Wound: Sternal wound is clean and dry.   Lab Results: CBC: Recent Labs    10/01/19 0519 10/02/19 0213  WBC 6.2 6.6  HGB 7.5* 7.2*  HCT 22.4* 21.8*  PLT 89* 107*   BMET:  Recent Labs  10/01/19 0519 10/02/19 0213  NA 140 139  K 4.5 4.5  CL 106 105  CO2 24 25  GLUCOSE 97 103*  BUN 39* 34*  CREATININE 3.68* 3.08*  CALCIUM 8.2* 8.3*    CMET: Lab Results  Component Value Date   WBC 6.6 10/02/2019   HGB 7.2 (L) 10/02/2019   HCT 21.8 (L) 10/02/2019   PLT 107 (L) 10/02/2019   GLUCOSE 103 (H) 10/02/2019   CHOL 132 04/02/2019   TRIG 123.0 04/02/2019   HDL 35.00 (L) 04/02/2019   LDLDIRECT 50.0 10/02/2018   LDLCALC 73 04/02/2019   ALT 17 09/29/2019   AST 31 09/29/2019   NA 139 10/02/2019   K 4.5 10/02/2019   CL 105 10/02/2019   CREATININE 3.08 (H) 10/02/2019   BUN 34 (H) 10/02/2019   CO2 25 10/02/2019   TSH 0.977 09/29/2019   PSA 1.04 10/02/2018   INR 1.0  09/27/2019   HGBA1C 7.3 (H) 09/26/2019   MICROALBUR 3.6 (H) 08/31/2017    PT/INR: No results for input(s): LABPROT, INR in the last 72 hours. Radiology: No results found.   Assessment/Plan: S/P Procedure(s) (LRB): REDO STERNOTOMY (N/A) REDO ASCENDING AORTIC ROOT REPLACEMENT USING LIFENET 25 MM CRYO, AORTIC VALVE HOMOGRAFT AND 30 MM HEMASHIELD PLATINUM GRAFT (N/A) CORONARY ARTERY BYPASS GRAFTING (CABG), ON PUMP, TIMES THREE, USING ENDOSCOPICALLY HARVESTED LEFT GREATER SAPHENOUS VEIN (N/A) TRANSESOPHAGEAL ECHOCARDIOGRAM (TEE) (N/A)   1. CV-He went into a fib with RVR the evening of 05/08. PVCs this am with HR in the 110's. On Dopamine, Amiodarone 400 mg bid. Hope to start BB soon.  2. Pulmonary-on 3 liters of oxygen via [place].  CXR this am appears to show cardiomegaly, no pneumothorax. Encourage incentive spirometer. Mucinex for cought/sputum 3. Volume overload-not on diuretic secondary to AKI. Will consider diuretic in am as long as creatinine continues to improve 4. DM-CBGs 165/122/78. On Insulin 20 units [place] daily. Pre op HGA1C 7.3. 5. ID-on Ceftriaxone for Strep bacteremia (Gentamycin discontinued secondary to AKI). Awaiting final cultures;no growth to date 6. Expected post op blood loss anemia-H and H this am remains  7.2 and 21.8. On Trinsicon and will transfuse 7. AKI-Possible etiology ATN. Creatinine slightly decreased to 3.08 8. Thrombocytopenia-platelets this am slightly increased to 107,000 9. 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 LOC constipation and Benadryl for sleep 12.Will stop Dopamine and transfer  Nani Skillern PA-C 10/02/2019 7:12 AM    Chart reviewed, patient examined, agree with above. He continues to progress well. Renal function continues to improve. DC dopamine and follow. May start diuresis tomorrow. Hgb slightly lower but no blood loss. Will transfuse one  unit and continue iron. Transfer to 4E.

## 2019-10-02 NOTE — Progress Notes (Signed)
Patient converted to afib 140-150s while having a bowel movement. Elevated heart rate returned to low 90-100s after pt returned to bed. Quick recovery. Pt asymptomatic. MD Gerhardt notified, no new orders.

## 2019-10-02 NOTE — Progress Notes (Signed)
[facility]  Date of Admission:  09/08/2019     Total days of antibiotics 24         ASSESSMENT:  Mr. Zurcher appears clinically improved today and is POD 6 from surgery. Surgical specimens are finalized without growth. Renal function continues to improve slowly from AKI. Mr. Ram will need prolonged therapy for prosthetic valve endocarditis with plans for 6 weeks from 5/6 with end date for 11/07/19. PICC line in place. Plan of care discussed with Mr. Deerman. Will arrange for follow up in the ID office following discharge. Continue wound care and mobilization per CVTS. ID will sign off and be available as needed.   PLAN:  1. Continue ceftraixone through 11/07/19 2. OPAT orders placed. 3. Plasmacytoma follow up per oncology.  4. Follow up in ID clinic following discharge.  Diagnosis: Endocarditis   Culture Result: Streptococcus infantaris  Allergies  Allergen Reactions  . Diltiazem Hives  . Insulin Glargine Swelling    edema    OPAT Orders Discharge antibiotics to be given via PICC line Discharge antibiotics: Ceftriaxone Per pharmacy protocol  Aim for Vancomycin trough 15-20 or AUC 400-550 (unless otherwise indicated) Duration: 6 weeks  End Date: 11/07/19  [facility] Care Per Protocol:  Home health RN for IV administration and teaching; PICC line care and labs.    Labs weekly while on IV antibiotics: _X_ CBC with differential _X_ BMP __ CMP __ CRP __ ESR __ Vancomycin trough __ CK  _X_ Please pull PIC at completion of IV antibiotics __ Please leave PIC in place until doctor has seen patient or been notified  Fax weekly labs to [phone]  Clinic Follow Up Appt:  10/23/19 at 11am with Dr. 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)   Endocarditis   Closed fracture of T10 vertebra ([place])   . sodium chloride   Intravenous Once  . amiodarone  400 mg Oral BID  . aspirin EC  325 mg Oral Daily   Or  . aspirin  324 mg Per Tube Daily  . atorvastatin  40 mg Oral Q breakfast  . bisacodyl  10 mg Oral Daily   Or  . bisacodyl  10 mg Rectal Daily  . Chlorhexidine Gluconate Cloth  6 each Topical Daily  . diphenhydrAMINE  25 mg Oral QHS  . docusate sodium  200 mg Oral Daily  . feeding supplement  1 Container Oral BID BM  . feeding supplement (ENSURE ENLIVE)  237 mL Oral BID BM  . ferrous [id number] C-folic acid  1 capsule Oral BID PC  . gabapentin  300 mg Oral Q breakfast  . guaiFENesin  600 mg Oral BID  . insulin aspart  0-24 Units Subcutaneous TID AC & HS  . insulin detemir  20 Units Subcutaneous Daily  . levothyroxine  50 mcg Oral Q0600  . magic mouthwash  15 mL Oral TID  . mouth rinse  15 mL Mouth Rinse  BID  . pantoprazole  40 mg Oral Daily  . sodium chloride flush  3 mL Intravenous Q12H    SUBJECTIVE:  Afebrile overnight with no acute events. Has been walking with therapy and wants to get back to the bed after sitting in the chair. Some mild chest pain but adequately controlled.   Allergies  Allergen Reactions  . Diltiazem Hives  . Insulin Glargine Swelling    edema     Review of Systems: Review of Systems  Constitutional: Negative for chills, fever and weight loss.  Respiratory: Negative for cough, shortness of breath and wheezing.   Cardiovascular: Positive for chest pain. Negative for leg swelling.  Gastrointestinal: Negative for abdominal pain, constipation,  diarrhea, nausea and vomiting.  Skin: Negative for rash.      OBJECTIVE: Vitals:   10/02/19 0900 10/02/19 1000 10/02/19 1100 10/02/19 1118  BP:  108/75    Pulse: (!) 106 90    Resp: 12 17 (!) 27   Temp:    98.5 F (36.9 C)  TempSrc:    Oral  SpO2: 97% 96%    Weight:      Height:       Body mass index is 39.71 kg/m.  Physical Exam Constitutional:      General: He is not in acute distress.    Appearance: He is well-developed.     Comments: Seated in the chair; pleasant.   Cardiovascular:     Rate and Rhythm: Regular rhythm. Tachycardia present.  Pulmonary:     Effort: Pulmonary effort is normal.     Breath sounds: Decreased breath sounds present.  Skin:    General: Skin is warm and dry.  Neurological:     Mental Status: He is alert and oriented to person, place, and time.  Psychiatric:        Behavior: Behavior normal.        Thought Content: Thought content normal.        Judgment: Judgment normal.     Lab Results Lab Results  Component Value Date   WBC 6.6 10/02/2019   HGB 7.2 (L) 10/02/2019   HCT 21.8 (L) 10/02/2019   MCV 86.5 10/02/2019   PLT 107 (L) 10/02/2019    Lab Results  Component Value Date   CREATININE 3.08 (H) 10/02/2019   BUN 34 (H) 10/02/2019   NA 139 10/02/2019   K 4.5 10/02/2019   CL 105 10/02/2019   CO2 25 10/02/2019    Lab Results  Component Value Date   ALT 17 09/29/2019   AST 31 09/29/2019   ALKPHOS 48 09/29/2019   BILITOT 3.2 (H) 09/29/2019     Microbiology: Recent Results (from the past 240 hour(s))  Surgical pcr screen     Status: None   Collection Time: 09/23/19  4:39 AM   Specimen: Nasal Mucosa; Nasal Swab  Result Value Ref Range Status   MRSA, PCR NEGATIVE NEGATIVE Final   Staphylococcus aureus NEGATIVE NEGATIVE Final    Comment: (NOTE) The Xpert SA Assay (FDA approved for NASAL specimens in patients 23 years of age and older), is one component of a comprehensive surveillance program. It is not intended to  diagnose infection nor to guide or monitor treatment. Performed at [facility], [place] [address], [place], [place] [address]   Aerobic/Anaerobic Culture (surgical/deep wound)     Status: None   Collection Time: 09/26/19  1:03 PM   Specimen: Soft Tissue, Other  Result Value Ref Range Status   Specimen  Description TISSUE  Final   Special Requests TRICUSPID VALVE VEGETATION SPEC A  Final   Gram Stain NO WBC SEEN NO ORGANISMS SEEN   Final   Culture   Final    No growth aerobically or anaerobically. Performed at [facility], [place] [address], [place], [place] [address]    Report Status 10/01/2019 FINAL  Final  Aerobic/Anaerobic Culture (surgical/deep wound)     Status: None   Collection Time: 09/26/19  1:47 PM   Specimen: PATH Soft tissue resection  Result Value Ref Range Status   Specimen Description TISSUE  Final   Special Requests EXPLANTED AORTIC VALVE GRAFT SPEC B  Final   Gram Stain NO WBC SEEN NO ORGANISMS SEEN   Final   Culture   Final    No growth aerobically or anaerobically. Performed at [facility], [place] [address] [place], [place] [address]    Report Status 10/01/2019 FINAL  Final     Terri Piedra, NP [facility]  10/02/2019  11:38 AM

## 2019-10-02 NOTE — Progress Notes (Signed)
PHARMACY CONSULT NOTE FOR:  OUTPATIENT  PARENTERAL ANTIBIOTIC THERAPY (OPAT)  Indication: Streptococcus infantaris bacteremia  Regimen: Ceftriaxone 2 gm IV q24hr  End date: 11/07/2019  IV antibiotic discharge orders are pended. To discharging provider:  please sign these orders via discharge navigator,  Select New Orders & click on the button choice - Manage This Unsigned Work.     Thank you for allowing pharmacy to be a part of this patient's care.  Acey Lav, PharmD  PGY1 Acute Care Pharmacy Resident 10/02/2019, 4:30 PM

## 2019-10-02 NOTE — Progress Notes (Signed)
Subjective:  Hemodynamically stable just on a little dopa-  UOP saying less but they may have missed some- crt down again -  Sitting up  "I think im better"   Objective Vital signs in last 24 hours: Vitals:   10/02/19 0300 10/02/19 0350 10/02/19 0400 10/02/19 0500  BP:   93/65   Pulse: 93  91 90  Resp: 16  16 17   Temp:  97.9 F (36.6 C)    TempSrc:  Axillary    SpO2: 97%  96% 99%  Weight:    (!) 140.3 kg  Height:       Weight change: 0.4 kg  Intake/Output Summary (Last 24 hours) at 10/02/2019 0646 Last data filed at 10/02/2019 0400 Gross per 24 hour  Intake 1122.9 ml  Output 965 ml  Net 157.9 ml    Assessment/ Plan: Pt is a 54 y.o. yo male DM, HTN, CAD and Afib, AAA who was admitted on 09/08/2019 with tricuspid valve endocarditis- s/p operative management complicated by AKI (crt normal on 5/6), new dx of plasmacytoma  Assessment/Plan: 1. Endocarditis-  S/p operative repair including CABG and replacement of aortic root. Per CTS- rocephin 2. Renal-  True AKI-  crt normal on 5/6.  Feel like is ATN due to above.   Nonoliguric, no indications for dialysis-  crt actually down again- Have no reason to think that it wont continue to improve 3. Anemia-  Due to surgical intervention and AKI--  Transfuse as needed   4. HTN/volume-  Overloaded but minimal O2 req-  Should cont to diurese on his own due to recovery.  I have chosen not to use scheduled lasix for this reason.  He should respond to prn lasix if needed.  Per CTS note, planning on stopping dopa today and transfer out of ICU  5.  Plasmacytoma-  11% plasma cells in bone marrow-  Allowing to recover from surgery and consider chemo/rediation at a later date  Pt doing well and renal function improved quite a bit the last 2 days.  Will sign off but follow labs at a distance for cont recovery.  Call with questions  Louis Meckel    Labs: Basic Metabolic Panel: Recent Labs  Lab 09/30/19 1215 10/01/19 0519 10/02/19 0213  NA  138 140 139  K 4.4 4.5 4.5  CL 106 106 105  CO2 21* 24 25  GLUCOSE 166* 97 103*  BUN 40* 39* 34*  CREATININE 3.99* 3.68* 3.08*  CALCIUM 7.8* 8.2* 8.3*  PHOS 5.2* 5.0* 3.3   Liver Function Tests: Recent Labs  Lab 09/26/19 0458 09/26/19 0458 09/28/19 0430 09/28/19 0430 09/29/19 0308 09/30/19 0408 09/30/19 1215 10/01/19 0519 10/02/19 0213  AST 37  --  40  --  31  --   --   --   --   ALT 25  --  17  --  17  --   --   --   --   ALKPHOS 73  --  42  --  48  --   --   --   --   BILITOT 0.8  --  2.7*  --  3.2*  --   --   --   --   PROT 7.1  --  5.4*  --  5.1*  --   --   --   --   ALBUMIN 2.7*   < > 2.6*   < > 2.2*   < > 2.2* 2.2* 2.2*   < > =  values in this interval not displayed.   No results for input(s): LIPASE, AMYLASE in the last 168 hours. No results for input(s): AMMONIA in the last 168 hours. CBC: Recent Labs  Lab 09/28/19 1911 09/28/19 1911 09/29/19 0426 09/29/19 0430 09/30/19 0408 10/01/19 0519 10/02/19 0213  WBC 13.1*   < > 9.2   < > 7.0 6.2 6.6  HGB 8.3*   < > 7.8*   < > 7.3* 7.5* 7.2*  HCT 24.8*   < > 23.5*   < > 22.6* 22.4* 21.8*  MCV 88.3  --  87.0  --  87.6 87.2 86.5  PLT 76*   < > 73*   < > 77* 89* 107*   < > = values in this interval not displayed.   Cardiac Enzymes: No results for input(s): CKTOTAL, CKMB, CKMBINDEX, TROPONINI in the last 168 hours. CBG: Recent Labs  Lab 10/01/19 0403 10/01/19 0801 10/01/19 1125 10/01/19 1531 10/01/19 2239  GLUCAP 78 130* 181* 165* 122*    Iron Studies: No results for input(s): IRON, TIBC, TRANSFERRIN, FERRITIN in the last 72 hours. Studies/Results: DG CHEST PORT 1 VIEW  Result Date: 10/01/2019 CLINICAL DATA:  Recent cardiac surgery. EXAM: PORTABLE CHEST 1 VIEW COMPARISON:  Chest x-ray from yesterday. FINDINGS: Interval removal of the right internal jugular sheath and left chest tube. Unchanged cardiomegaly status post CABG. Normal pulmonary vascularity. Mild bibasilar atelectasis. No focal consolidation,  pleural effusion, or pneumothorax. No acute osseous abnormality. IMPRESSION: 1. Mild bibasilar atelectasis. Electronically Signed   By: Titus Dubin M.D.   On: 10/01/2019 08:10   Medications: Infusions: . sodium chloride 20 mL/hr at 09/27/19 0200  . cefTRIAXone (ROCEPHIN)  IV Stopped (10/01/19 1136)  . DOPamine 2 mcg/kg/min (10/02/19 0400)  . lactated ringers Stopped (10/01/19 2306)    Scheduled Medications: . sodium chloride   Intravenous Once  . amiodarone  400 mg Oral BID  . aspirin EC  325 mg Oral Daily   Or  . aspirin  324 mg Per Tube Daily  . atorvastatin  40 mg Oral Q breakfast  . bisacodyl  10 mg Oral Daily   Or  . bisacodyl  10 mg Rectal Daily  . Chlorhexidine Gluconate Cloth  6 each Topical Daily  . diphenhydrAMINE  25 mg Oral QHS  . docusate sodium  200 mg Oral Daily  . feeding supplement  1 Container Oral BID BM  . feeding supplement (ENSURE ENLIVE)  237 mL Oral BID BM  . ferrous [id number] C-folic acid  1 capsule Oral BID PC  . gabapentin  300 mg Oral Q breakfast  . guaiFENesin  600 mg Oral BID  . insulin aspart  0-24 Units Subcutaneous TID AC & HS  . insulin detemir  20 Units Subcutaneous Daily  . levothyroxine  50 mcg Oral Q0600  . magic mouthwash  15 mL Oral TID  . mouth rinse  15 mL Mouth Rinse BID  . pantoprazole  40 mg Oral Daily  . sodium chloride flush  3 mL Intravenous Q12H    have reviewed scheduled and prn medications.  Physical Exam: General: sitting up-  looks pretty good Heart: RRR Lungs: dec BS at bases-  Abdomen: distended-   Extremities: mild edema    10/02/2019,6:46 AM  LOS: 24 days

## 2019-10-03 ENCOUNTER — Inpatient Hospital Stay (HOSPITAL_COMMUNITY): Payer: Medicare PPO

## 2019-10-03 LAB — CBC
HCT: 23.7 % — ABNORMAL LOW (ref 39.0–52.0)
Hemoglobin: 7.9 g/dL — ABNORMAL LOW (ref 13.0–17.0)
MCH: 28.8 pg (ref 26.0–34.0)
MCHC: 33.3 g/dL (ref 30.0–36.0)
MCV: 86.5 fL (ref 80.0–100.0)
Platelets: 152 10*3/uL (ref 150–400)
RBC: 2.74 MIL/uL — ABNORMAL LOW (ref 4.22–5.81)
RDW: 16 % — ABNORMAL HIGH (ref 11.5–15.5)
WBC: 6.5 10*3/uL (ref 4.0–10.5)
nRBC: 0.3 % — ABNORMAL HIGH (ref 0.0–0.2)

## 2019-10-03 LAB — RENAL FUNCTION PANEL
Albumin: 2.2 g/dL — ABNORMAL LOW (ref 3.5–5.0)
Anion gap: 8 (ref 5–15)
BUN: 27 mg/dL — ABNORMAL HIGH (ref 6–20)
CO2: 25 mmol/L (ref 22–32)
Calcium: 8.4 mg/dL — ABNORMAL LOW (ref 8.9–10.3)
Chloride: 104 mmol/L (ref 98–111)
Creatinine, Ser: 2.96 mg/dL — ABNORMAL HIGH (ref 0.61–1.24)
GFR calc Af Amer: 27 mL/min — ABNORMAL LOW (ref >60)
GFR calc non Af Amer: 23 mL/min — ABNORMAL LOW (ref >60)
Glucose, Bld: 119 mg/dL — ABNORMAL HIGH (ref 70–99)
Phosphorus: 3.1 mg/dL (ref 2.5–4.6)
Potassium: 5.1 mmol/L (ref 3.5–5.1)
Sodium: 137 mmol/L (ref 135–145)

## 2019-10-03 LAB — TYPE AND SCREEN
ABO/RH(D): A POS
Antibody Screen: NEGATIVE
Unit division: 0

## 2019-10-03 LAB — BPAM RBC
Blood Product Expiration Date: 202106012359
ISSUE DATE / TIME: 202105121132
Unit Type and Rh: 6200

## 2019-10-03 LAB — GLUCOSE, CAPILLARY
Glucose-Capillary: 113 mg/dL — ABNORMAL HIGH (ref 70–99)
Glucose-Capillary: 188 mg/dL — ABNORMAL HIGH (ref 70–99)
Glucose-Capillary: 122 mg/dL — ABNORMAL HIGH (ref 70–99)
Glucose-Capillary: 132 mg/dL — ABNORMAL HIGH (ref 70–99)

## 2019-10-03 LAB — CYTOLOGY - NON PAP

## 2019-10-03 IMAGING — DX DG CHEST 2V
2 series · 2 of 2 positions shown · non-contrast
Comparison: Yesterday

CLINICAL DATA: Pleural effusion

EXAM:
CHEST - 2 VIEW

[chest ap]
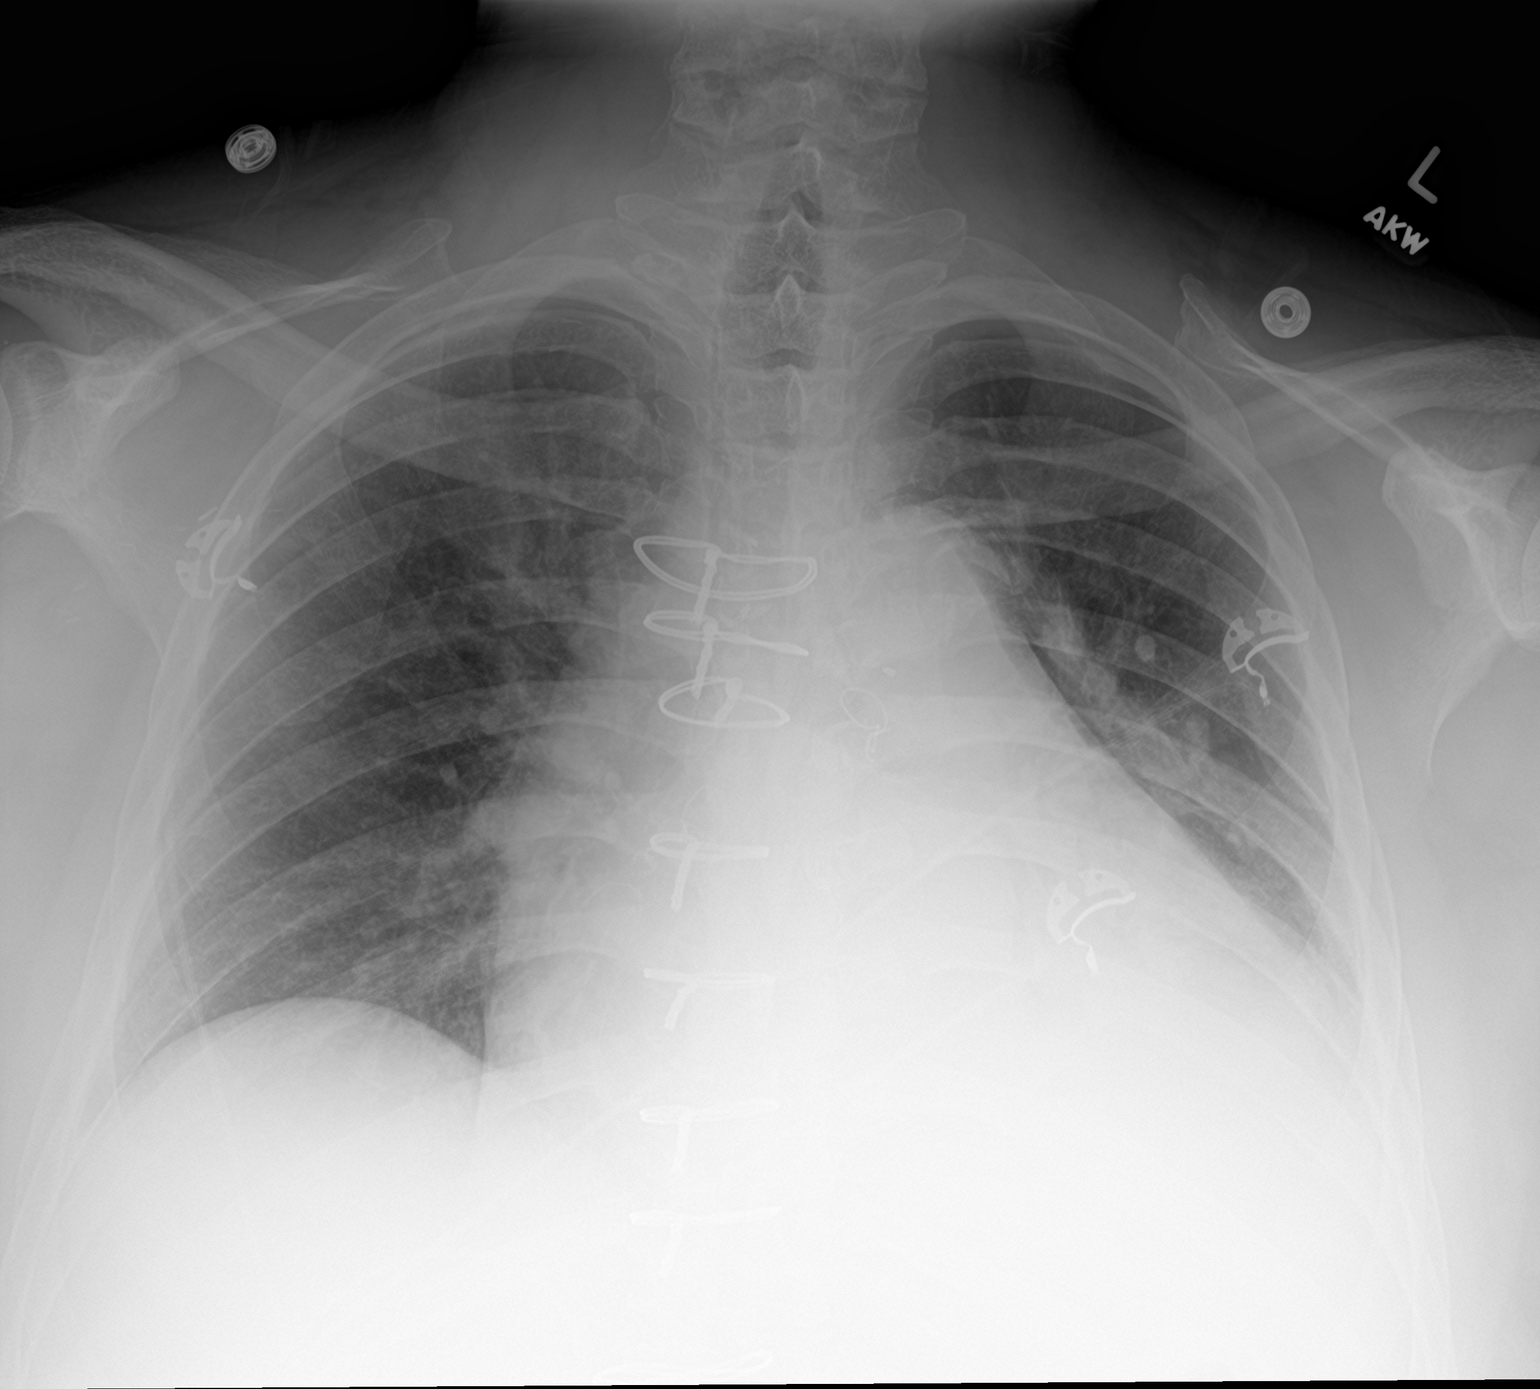

[chest lat]
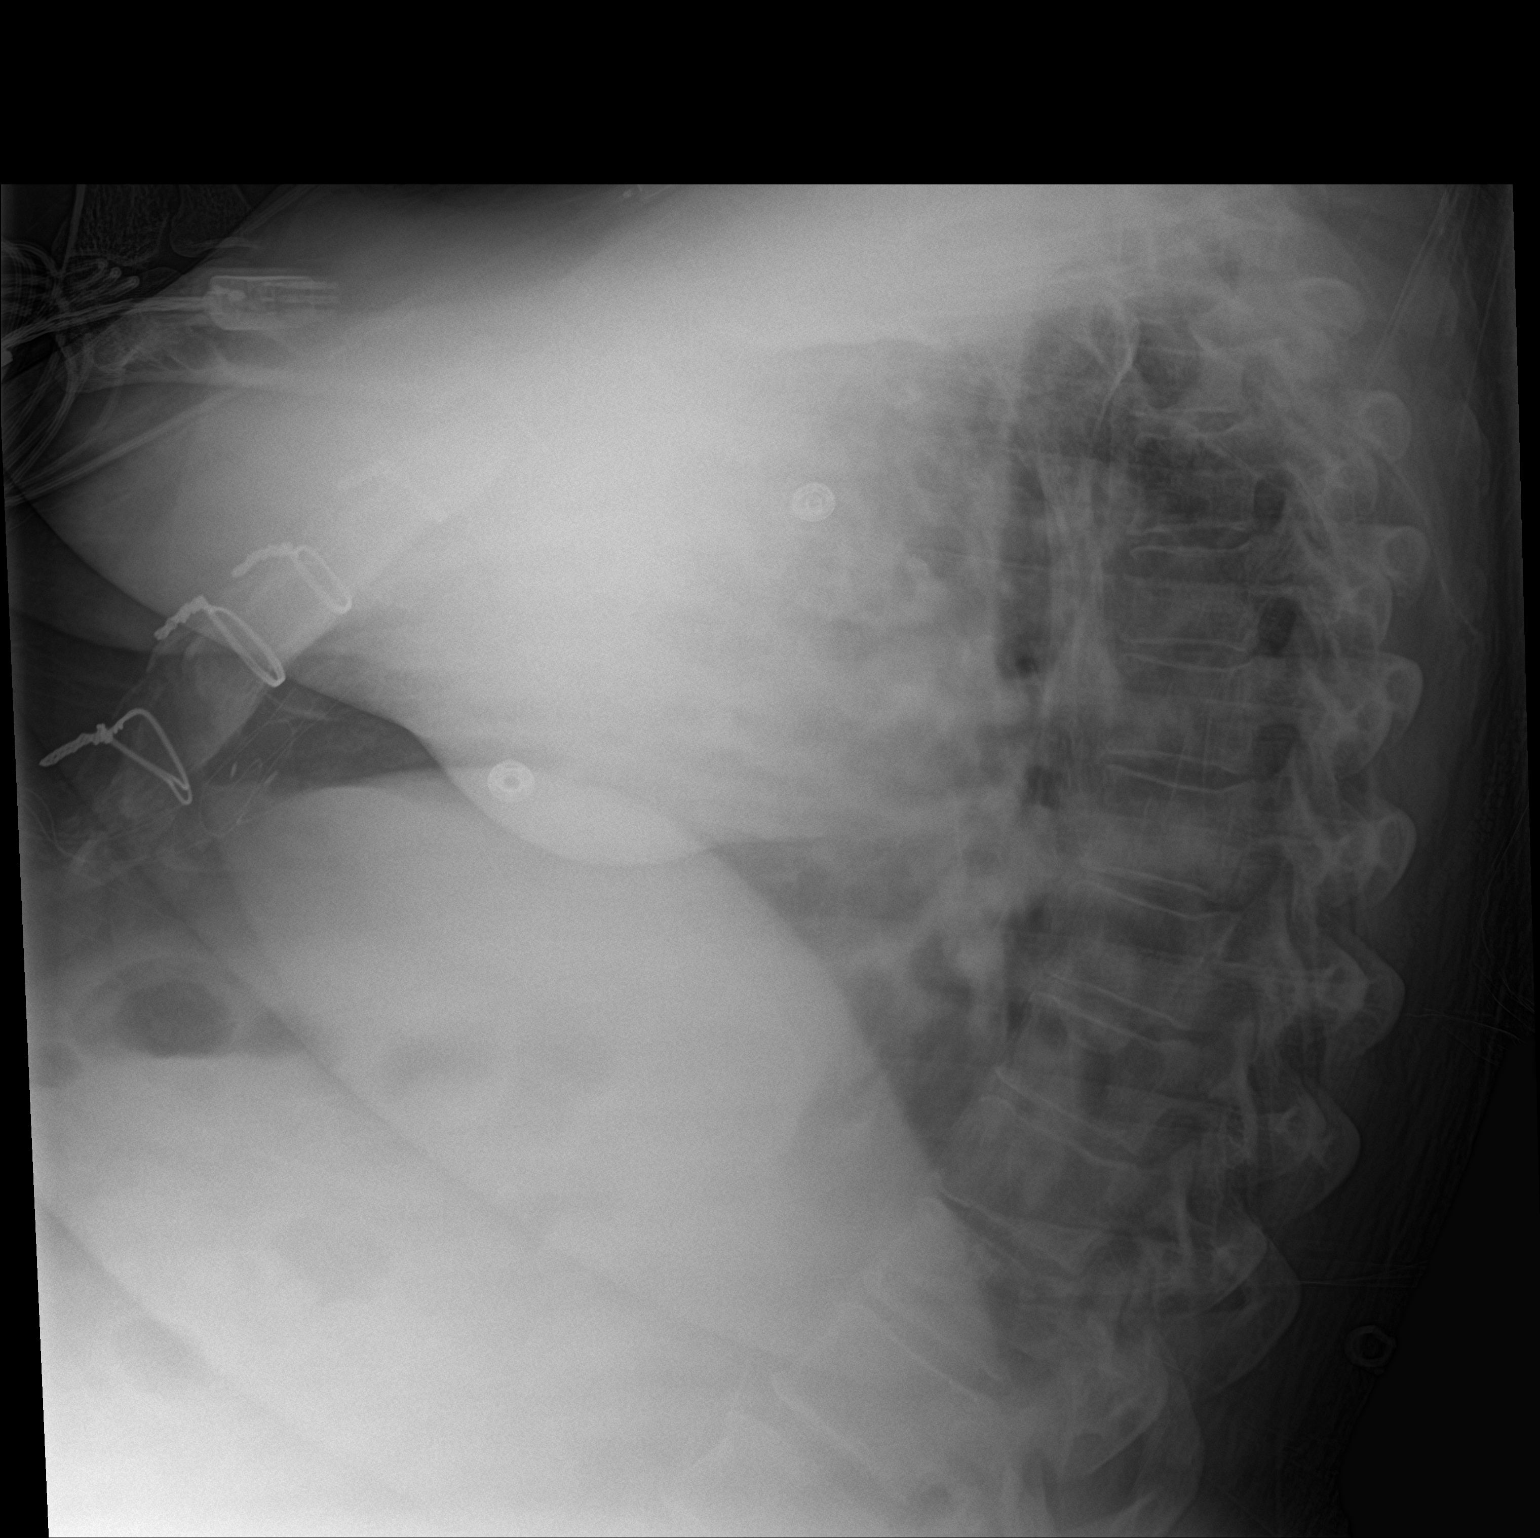

[2 of 2 positions shown; findings below may reference images not displayed]

FINDINGS: Hazy left lower chest that is unchanged. Cardiopericardial
enlargement. CABG and aortic valve replacement. No visible
pneumothorax.
IMPRESSION: Unchanged retrocardiac opacity which is likely both pleural fluid
and pulmonary.

## 2019-10-03 MED ORDER — METOPROLOL TARTRATE 25 MG PO TABS
25.0000 mg | ORAL_TABLET | Freq: Two times a day (BID) | ORAL | Status: DC
  Administered 2019-10-03 – 2019-10-04 (×4): 25 mg via ORAL
  Filled 2019-10-03 (×4): qty 1

## 2019-10-03 MED FILL — Lidocaine HCl Local Soln Prefilled Syringe 100 MG/5ML (2%): INTRAMUSCULAR | Qty: 10 | Status: AC

## 2019-10-03 MED FILL — Sodium Bicarbonate IV Soln 8.4%: INTRAVENOUS | Qty: 100 | Status: AC

## 2019-10-03 MED FILL — Electrolyte-R (PH 7.4) Solution: INTRAVENOUS | Qty: 7000 | Status: AC

## 2019-10-03 MED FILL — Calcium Chloride Inj 10%: INTRAVENOUS | Qty: 10 | Status: AC

## 2019-10-03 MED FILL — Potassium Chloride Inj 2 mEq/ML: INTRAVENOUS | Qty: 40 | Status: AC

## 2019-10-03 MED FILL — Heparin Sodium (Porcine) Inj 1000 Unit/ML: INTRAMUSCULAR | Qty: 30 | Status: AC

## 2019-10-03 MED FILL — Mannitol IV Soln 20%: INTRAVENOUS | Qty: 500 | Status: AC

## 2019-10-03 MED FILL — Sodium Chloride IV Soln 0.9%: INTRAVENOUS | Qty: 4000 | Status: AC

## 2019-10-03 NOTE — Progress Notes (Signed)
Pacing wire pulled per order. Pacing wire ends intact. Scant of Serosanguinous drainage present. Applied steri strip and gauze over. Pt tolerated well. VSS.   Lavenia Atlas, RN

## 2019-10-03 NOTE — Progress Notes (Signed)
Mobility Specialist - Progress Note   10/03/19 1518  Mobility  Activity Ambulated in hall  Level of Assistance Standby assist, set-up cues, supervision of patient - no hands on  Assistive Device Front wheel walker  Distance Ambulated (ft) 550 ft  Mobility Response Tolerated well  Mobility performed by Mobility specialist  Transport method Ambulatory  $Mobility charge 1 Mobility    Pre-mobility: 85 HR,  96%SpO2 During mobility: 110 HR Post-mobility: 84 HR,  93% SPO2  Reminded pt of sternal precautions, he voiced understanding. I left him lying in his bed with his bell alarm and phone by him. His family member was in the room with him.   [place] Specialist

## 2019-10-03 NOTE — Progress Notes (Signed)
[facility] and [facility] Lemmings working together for infusion at home.WIll need a home health order, face to face prior to discharge

## 2019-10-03 NOTE — Progress Notes (Signed)
Physical Therapy Treatment Patient Details Name: [name] MRN: [id number] DOB: 02-20-1966 Today's Date: 10/03/2019    History of Present Illness 54 yo admitted with endocarditis s/p redo aortic root replacement with CABG x 3. PMhx: L3-4 lami, CVA, aortic valve replacement, DM, CHF, CAD,HTN, HLD, cABG    PT Comments    Pt was seen to work on gait but was fatigued and uncomfortable from longer walk.  He worked on [employer] with assist, discussed his pain on LLE from CABG graft.  Will continue with mobility next visit, will encourage OOB to chair as tolerated.   Follow Up Recommendations  No PT follow up     Equipment Recommendations  Other (comment)    Recommendations for Other Services       Precautions / Restrictions Precautions Precautions: Fall;Sternal Precaution Comments: educated pt for sternal precautions Restrictions Weight Bearing Restrictions: Yes RUE Weight Bearing: Partial weight bearing RUE Partial Weight Bearing Percentage or Pounds: Sternal Precautions LUE Weight Bearing: Partial weight bearing LUE Partial Weight Bearing Percentage or Pounds: Sternal Precautions    Mobility  Bed Mobility               General bed mobility comments: declined OOB  Transfers                    Ambulation/Gait                 Stairs             Wheelchair Mobility    Modified Rankin (Stroke Patients Only)       Balance                                            Cognition Arousal/Alertness: Awake/alert Behavior During Therapy: WFL for tasks assessed/performed Overall Cognitive Status: Within Functional Limits for tasks assessed                                        Exercises General Exercises - Lower Extremity Ankle Circles/Pumps: AROM;5 reps Quad Sets: AROM;10 reps Gluteal Sets: AROM;10 reps Heel Slides: AROM;10 reps Hip ABduction/ADduction: AROM;10 reps Straight Leg Raises:  AAROM;10 reps Hip Flexion/Marching: 10 reps;AROM    General Comments        Pertinent Vitals/Pain Pain Assessment: Faces Faces Pain Scale: Hurts little more Pain Location: LLE Pain Descriptors / Indicators: Guarding Pain Intervention(s): Limited activity within patient's tolerance;Premedicated before session;Repositioned    Home Living                      Prior Function            PT Goals (current goals can now be found in the care plan section) Progress towards PT goals: Progressing toward goals    Frequency    Min 3X/week      PT Plan Current plan remains appropriate    Co-evaluation              AM-PAC PT "6 Clicks" Mobility   Outcome Measure  Help needed turning from your back to your side while in a flat bed without using bedrails?: A Little Help needed moving from lying on your back to sitting on the side of a flat bed without using bedrails?: A Little  Help needed moving to and from a bed to a chair (including a wheelchair)?: A Little Help needed standing up from a chair using your arms (e.g., wheelchair or bedside chair)?: A Little Help needed to walk in hospital room?: A Little Help needed climbing 3-5 steps with a railing? : A Lot 6 Click Score: 17    End of Session   Activity Tolerance: Patient limited by fatigue;Patient limited by pain Patient left: in bed;with call bell/phone within reach;with bed alarm set Nurse Communication: Mobility status PT Visit Diagnosis: Other abnormalities of gait and mobility (R26.89)     Time: [id number] PT Time Calculation (min) (ACUTE ONLY): 24 min  Charges:  $Therapeutic Exercise: 23-37 mins                    Ramond Dial 10/03/2019, 10:28 PM  Mee Hives, PT MS Acute Rehab Dept. Number: [place] [facility] [place]

## 2019-10-03 NOTE — Progress Notes (Signed)
CARDIAC REHAB PHASE I   PRE:  Rate/Rhythm: 116 Afib  BP:  Sitting: 127/102      SaO2: 98 RA  MODE:  Ambulation: 230 ft   POST:  Rate/Rhythm: 120s-170s Afib  BP:  Sitting: 167/83    SaO2: 99 RA  Pt ambulated 271ft in hallway standby assist with front wheel walker. Pt took one standing rest break c/o fast HR and some SOB. Pt returned to recliner. Encouraged continued ambulation with emphasis on listening to his symptoms. Pt demonstrating 500 on IS. Will continue to follow.  [id number] Rufina Falco, RN BSN 10/03/2019 9:11 AM

## 2019-10-03 NOTE — Progress Notes (Addendum)
[address]       [place] [address]             [phone]        7 Days Post-Op Procedure(s) (LRB): REDO STERNOTOMY (N/A) REDO ASCENDING AORTIC ROOT REPLACEMENT USING LIFENET 25 MM CRYO, AORTIC VALVE HOMOGRAFT AND 30 MM HEMASHIELD PLATINUM GRAFT (N/A) CORONARY ARTERY BYPASS GRAFTING (CABG), ON PUMP, TIMES THREE, USING ENDOSCOPICALLY HARVESTED LEFT GREATER SAPHENOUS VEIN (N/A) TRANSESOPHAGEAL ECHOCARDIOGRAM (TEE) (N/A)  Subjective: Patient did not sleep well last night, despite Benadryl. He asked when is the swelling going to go down in his hands  Objective: Vital signs in last 24 hours: Temp:  [98.2 F (36.8 C)-99 F (37.2 C)] 98.8 F (37.1 C) (05/13 0322) Pulse Rate:  [62-106] 106 (05/12 2037) Cardiac Rhythm: Normal sinus rhythm (05/13 0322) Resp:  [12-28] 21 (05/13 0300) BP: (102-157)/(75-89) 121/80 (05/13 0322) SpO2:  [92 %-98 %] 93 % (05/12 2311) Weight:  [140.3 kg] 140.3 kg (05/13 0322)  Pre op weight 132 kg Current Weight  10/03/19 (!) 140.3 kg      Intake/Output from previous day: 05/12 0701 - 05/13 0700 In: 1251 [P.O.:360; I.V.:276; Blood:515; IV Piggyback:100] Out: 1050 [Urine:1050]   Physical Exam:  Cardiovascular: RRR Pulmonary: Some crackles on left Abdomen: Soft, non tender, bowel sounds present. Extremities: SCDs. Hand and LE edema Wounds: Clean and dry.  No erythema or signs of infection.  Lab Results: CBC: Recent Labs    10/02/19 1613 10/03/19 0414  WBC 7.4 6.5  HGB 7.9* 7.9*  HCT 23.9* 23.7*  PLT 124* 152   BMET:  Recent Labs    10/02/19 0213 10/03/19 0414  NA 139 137  K 4.5 5.1  CL 105 104  CO2 25 25  GLUCOSE 103* 119*  BUN 34* 27*  CREATININE 3.08* 2.96*  CALCIUM 8.3* 8.4*    PT/INR:  Lab Results  Component Value Date   INR 1.0 09/27/2019   INR 1.2 09/26/2019   INR 1.2 09/25/2019   PROTIME 15.9 11/06/2008   PROTIME 15.6 10/23/2008   ABG:  INR: Will add last result for INR, ABG once  components are confirmed Will add last 4 CBG results once components are confirmed  Assessment/Plan: 1. CV-He went into a fib with RVR the evening of 05/08. Has had some periods of a fib. SR, first degree heart block this am with HR in the 90's to low 100's. On Amiodarone 400 mg bid. Hope to start BB in next day or two as BP starting to be not as labile. If has more persistent a fib will have to consider anticoagulation 2. Pulmonary-on room air. CXR this am appears to show cardiomegaly, low lung volumes, no pneumothorax. Encourage incentive spirometer. Mucinex for cought/sputum 3. Volume overload-not on diuretic secondary to AKI. Will consider diuresis once kidney function recovers more 4. DM-CBGs 195/178/113. On Insulin 20 units [place] daily. Pre op HGA1C 7.3. 5. ID-on Ceftriaxone for Strep bacteremia (Gentamycin discontinued secondary to AKI). Awaiting final cultures;no growth to date 6. Expected post op blood loss anemia-H and H this am slightly increased to 7.9 and 23.7 (s/p transfusion). On Trinsicon 7. AKI-Possible etiology ATN. Creatinine slightly decreased to 2.96 8. Thrombocytopenia resolved -platelets this amincreased to 152,000 9. Hypothyroidism-continue Levothyroxine 50 mcg daily 10. Plasmacytoma-chemo/radiation to be considered once recovered from heart surgery    Donielle M [facility] 10/03/2019,7:01 AM    Chart reviewed, patient examined, agree with above. CXR ok. No significant effusion. Cardiomegaly  as expected. BP is stable so will start Lopressor 25 bid and titrate as tolerate. Continue amio.  Creat down just slightly from yesterday and may see slower drop now. I would hold on diuresis for now.

## 2019-10-03 NOTE — TOC Initial Note (Signed)
Transition of Care [facility]) - Initial/Assessment Note    Patient Details  Name: [name] MRN: [id number] Date of Birth: 1965/06/24  Transition of Care [facility]) CM/SW Contact:    Verdell Carmine, RN Phone Number: 10/03/2019, 3:10 PM  Clinical Narrative:                 Patient admitted with Nausea vomiting, found to have endocarditis.  redo sternotomy , Aortic root replacement  , AVR and CABGvx 3. Found to have endocarditis with .Streptococcus Infantries.  Will be on rocephin for  .6 weeks. Spoke to patent and wife Patient has PICC line  Using [facility] for antibiotics and HH with HELMS. [facility] for Walker and 3:1. Ordered DME. Patient did have some atrial fibrillation with RVR this AM, BB ordered, will follow for further needs. Pam from [facility] will be coming to the unit shortly to meet the patient and his wife and start teaching. Plan discharge in 2-3 days.   Expected Discharge Plan: [place] Barriers to Discharge: Continued Medical Work up   Patient Goals and CMS Choice Patient states their goals for this hospitalization and ongoing recovery are:: TO return home, needs walker and [facility] CMS Medicare.gov Compare Post Acute Care list provided to:: Patient Choice offered to / list presented to : Patient  Expected Discharge Plan and Services Expected Discharge Plan: [place] In-house Referral: NA Discharge Planning Services: CM Consult Post Acute Care Choice: [place] arrangements for the past 2 months: Apartment                 DME Arranged: Berta Minor DME Agency: [employer] Date DME Agency Contacted: 10/03/19                Prior Living Arrangements/Services Living arrangements for the past 2 months: Apartment Lives with:: Spouse Patient language and need for interpreter reviewed:: Yes        Need for Family Participation in Patient Care: Yes (Comment) Care giver support system in place?: Yes (comment)   Criminal  Activity/Legal Involvement Pertinent to Current Situation/Hospitalization: No - Comment as needed  Activities of Daily Living Home Assistive Devices/Equipment: Eyeglasses ADL Screening (condition at time of admission) Patient's cognitive ability adequate to safely complete daily activities?: Yes Is the patient deaf or have difficulty hearing?: No Does the patient have difficulty seeing, even when wearing glasses/contacts?: No Does the patient have difficulty concentrating, remembering, or making decisions?: No Patient able to express need for assistance with ADLs?: Yes Does the patient have difficulty dressing or bathing?: No Independently performs ADLs?: Yes (appropriate for developmental age) Does the patient have difficulty walking or climbing stairs?: Yes Weakness of Legs: Both Weakness of Arms/Hands: None  Permission Sought/Granted                  Emotional Assessment Appearance:: Appears stated age Attitude/Demeanor/Rapport: Engaged Affect (typically observed): Appropriate Orientation: : Oriented to Situation, Oriented to  Time, Oriented to Place, Oriented to Self Alcohol / Substance Use: Not Applicable Psych Involvement: No (comment)  Admission diagnosis:  Dyspnea on exertion [R06.00] Chronic combined systolic and diastolic congestive heart failure (HCC) [I50.42] Nausea vomiting and diarrhea [R11.2, R19.7] Endocarditis [I38] Patient Active Problem List   Diagnosis Date Noted  . Closed fracture of T10 vertebra ([place])   . Endocarditis 09/26/2019  . 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 Streptococcal bacteremia 09/10/2019  . Endocarditis of tricuspid valve 09/10/2019  . Suspected endocarditis mechanical aortic valve 09/10/2019  .  Nausea vomiting and diarrhea   . Chronic anticoagulation 04/02/2019  . Wellness examination 09/28/2018  . Lumbar disc herniation 06/06/2018  . CAD (coronary artery disease)   . Low back pain 03/07/2018  . Obesity (BMI 30-39.9) 01/15/2018  .  Acute on chronic systolic congestive heart failure ([place])   . Status post coronary artery stent placement   . Shortness of breath 10/22/2017  . Normocytic anemia 08/31/2017  . Chronic combined systolic and diastolic congestive heart failure ([place]) 03/30/2016  . Screen for colon cancer 09/03/2015  . PTSD (post-traumatic stress disorder) 10/31/2013  . Depressive disorder 10/31/2013  . Hallucinations 10/31/2013  . Cholelithiasis 08/22/2013  . Hypothyroidism 08/22/2013  . Encounter for therapeutic drug monitoring 08/07/2013  . Unstable angina ([place]) 07/25/2013  . CAD of artery bypass graft-  07/24/2013  . Hematoma of groin 07/24/2013  . Retroperitoneal bleed Nov 2014 07/24/2013  . Acute diastolic heart failure ([place]) 07/21/2013  . Ascending aortic dissection  in 2000- s/p [place] and MDT tilting disc AVR   . PAF (paroxysmal atrial fibrillation) ([place])   . Chest tube in place 03/24/2013  . Bladder neck obstruction 05/04/2012  . Unspecified vitamin D deficiency 08/07/2008  . Gross hematuria 08/07/2008  . FATIGUE 12/26/2007  . Hyperlipidemia 08/20/2007  . Diabetes mellitus type II, non insulin dependent ([place]) 05/21/2007  . Essential hypertension 05/21/2007  . NEPHROLITHIASIS, HX OF 05/21/2007   PCP:  Biagio Borg, MD Pharmacy:   [facility], [address] [place] [address] Phone: [phone] Fax: [phone]  [facility] [phone] - [place], [place] - [address] [place] [address] Phone: [phone] Fax: [phone]     Social Determinants of Health (SDOH) Interventions    Readmission Risk Interventions Readmission Risk Prevention Plan 09/18/2019  Transportation Screening Complete  HRI or Home Care Consult Complete  Social Work Consult for [place] Planning/Counseling Complete  Palliative Care Screening Not Applicable  Medication  Review Press photographer) Complete  Some recent data might be hidden

## 2019-10-04 ENCOUNTER — Encounter (HOSPITAL_COMMUNITY): Payer: Self-pay | Admitting: Internal Medicine

## 2019-10-04 LAB — RENAL FUNCTION PANEL
Albumin: 2.4 g/dL — ABNORMAL LOW (ref 3.5–5.0)
Anion gap: 10 (ref 5–15)
BUN: 26 mg/dL — ABNORMAL HIGH (ref 6–20)
CO2: 24 mmol/L (ref 22–32)
Calcium: 8.8 mg/dL — ABNORMAL LOW (ref 8.9–10.3)
Chloride: 103 mmol/L (ref 98–111)
Creatinine, Ser: 2.69 mg/dL — ABNORMAL HIGH (ref 0.61–1.24)
GFR calc Af Amer: 30 mL/min — ABNORMAL LOW (ref >60)
GFR calc non Af Amer: 26 mL/min — ABNORMAL LOW (ref >60)
Glucose, Bld: 100 mg/dL — ABNORMAL HIGH (ref 70–99)
Phosphorus: 3.5 mg/dL (ref 2.5–4.6)
Potassium: 5 mmol/L (ref 3.5–5.1)
Sodium: 137 mmol/L (ref 135–145)

## 2019-10-04 LAB — CBC
HCT: 24.1 % — ABNORMAL LOW (ref 39.0–52.0)
Hemoglobin: 8.1 g/dL — ABNORMAL LOW (ref 13.0–17.0)
MCH: 29.3 pg (ref 26.0–34.0)
MCHC: 33.6 g/dL (ref 30.0–36.0)
MCV: 87.3 fL (ref 80.0–100.0)
Platelets: 229 10*3/uL (ref 150–400)
RBC: 2.76 MIL/uL — ABNORMAL LOW (ref 4.22–5.81)
RDW: 16.1 % — ABNORMAL HIGH (ref 11.5–15.5)
WBC: 8.8 10*3/uL (ref 4.0–10.5)
nRBC: 0 % (ref 0.0–0.2)

## 2019-10-04 LAB — GLUCOSE, CAPILLARY
Glucose-Capillary: 88 mg/dL (ref 70–99)
Glucose-Capillary: 131 mg/dL — ABNORMAL HIGH (ref 70–99)
Glucose-Capillary: 146 mg/dL — ABNORMAL HIGH (ref 70–99)
Glucose-Capillary: 155 mg/dL — ABNORMAL HIGH (ref 70–99)

## 2019-10-04 MED ORDER — METOLAZONE 5 MG PO TABS
2.5000 mg | ORAL_TABLET | Freq: Once | ORAL | Status: AC
  Administered 2019-10-04: 2.5 mg via ORAL
  Filled 2019-10-04: qty 1

## 2019-10-04 MED ORDER — GABAPENTIN 300 MG PO CAPS
300.0000 mg | ORAL_CAPSULE | Freq: Every day | ORAL | Status: DC
Start: 2019-10-04 — End: 2019-10-10

## 2019-10-04 MED ORDER — WARFARIN - PHYSICIAN DOSING INPATIENT: Freq: Every day | Status: DC

## 2019-10-04 MED ORDER — ASPIRIN EC 81 MG PO TBEC
81.0000 mg | DELAYED_RELEASE_TABLET | Freq: Every day | ORAL | Status: DC
  Administered 2019-10-04 – 2019-10-07 (×4): 81 mg via ORAL
  Filled 2019-10-04 (×4): qty 1

## 2019-10-04 MED ORDER — VITAMIN D (ERGOCALCIFEROL) 1.25 MG (50000 UNIT) PO CAPS: 50000.0000 [IU] | ORAL_CAPSULE | ORAL | Status: DC

## 2019-10-04 MED ORDER — ASPIRIN EC 81 MG PO TBEC: 81.0000 mg | DELAYED_RELEASE_TABLET | Freq: Every day | ORAL | Status: AC | PRN

## 2019-10-04 MED ORDER — FUROSEMIDE 40 MG PO TABS
40.0000 mg | ORAL_TABLET | Freq: Once | ORAL | Status: AC
  Administered 2019-10-04: 40 mg via ORAL
  Filled 2019-10-04: qty 1

## 2019-10-04 MED ORDER — INSULIN DETEMIR 100 UNIT/ML ~~LOC~~ SOLN
18.0000 [IU] | Freq: Every day | SUBCUTANEOUS | Status: DC
  Administered 2019-10-04 – 2019-10-07 (×4): 18 [IU] via SUBCUTANEOUS
  Filled 2019-10-04 (×4): qty 0.18

## 2019-10-04 MED ORDER — ASPIRIN 81 MG PO CHEW: 81.0000 mg | CHEWABLE_TABLET | Freq: Every day | ORAL | Status: DC

## 2019-10-04 MED ORDER — WARFARIN SODIUM 5 MG PO TABS
5.0000 mg | ORAL_TABLET | Freq: Every day | ORAL | Status: DC
  Administered 2019-10-04 – 2019-10-06 (×3): 5 mg via ORAL
  Filled 2019-10-04 (×4): qty 1

## 2019-10-04 NOTE — Progress Notes (Signed)
Nutrition Follow-up  DOCUMENTATION CODES:   Obesity unspecified  INTERVENTION:    Continue Boost Breeze po BID, each supplement provides 250 kcal and 9 grams of protein.  Continue Pro-stat 30 ml PO BID, each supplement provides 100 kcal and 15 gm protein.  Continue Ensure Enlive po BID, each supplement provides 350 kcal and 20 grams of protein.  MVI with minerals daily.  NUTRITION DIAGNOSIS:   Increased nutrient needs related to acute illness(infectious endocarditis, buttocks abscess, upcoming surgery planned) as evidenced by estimated needs.  Ongoing   GOAL:   Patient will meet greater than or equal to 90% of their needs  Progressing  MONITOR:   PO intake, Supplement acceptance, Diet advancement, Labs  REASON FOR ASSESSMENT:   Consult Assessment of nutrition requirement/status(multiple dental extractions)  ASSESSMENT:   54 yo male admitted with N/V/D, DOE. Found to have infectious endocarditis. Also found to have a buttocks abscess and vertebral lesion. PMH includes HTN, HLD, PAF, TIA, DM, CAD s/p CABG 2000.  5/07 Re-Do median sternotomy, debridement of periannular abscess, CABG x 3  Pt complains he does not like hospital food. Meal completions charted as 20-100% for his last eight documented meals. Pt looks to have skipped many meal in between. Wife brought food from home and pt "threw it away." Drinking Ensure and Boost per reports. Per [facility] pt refused Ensure today. If intake does not progress consider Cortrak as intake has been off/on since admit.   Admission weight: 129.3 kg  Current weight: 138 kg   I/O: +839 ml since 4/30 UOP: 1,000 ml x 24 hrs   Medications: colace, SS novolog, levemir Labs: CBG 88-188  Diet Order:   Diet Order            Diet heart healthy/carb modified Room service appropriate? No; Fluid consistency: Thin  Diet effective now              EDUCATION NEEDS:   Not appropriate for education at this time  Skin:  Skin Assessment:  Skin Integrity Issues: Skin Integrity Issues:: Incisions, Other (Comment) Incisions: L thigh, chest Other: abscess to L buttocks  Last BM:  5/12  Height:   Ht Readings from Last 1 Encounters:  09/26/19 6\' 2"  (1.88 m)    Weight:   Wt Readings from Last 1 Encounters:  10/04/19 (!) 138 kg    Ideal Body Weight:  86.4 kg  BMI:  Body mass index is 39.06 kg/m.  Estimated Nutritional Needs:   Kcal:  2400-2600  Protein:  130-150 gm  Fluid:  >/= 2.4 L  Mariana Single RD, LDN Clinical Nutrition Pager listed in [place]

## 2019-10-04 NOTE — Progress Notes (Addendum)
      [address]       [place] [address]             [phone]        8 Days Post-Op Procedure(s) (LRB): REDO STERNOTOMY (N/A) REDO ASCENDING AORTIC ROOT REPLACEMENT USING LIFENET 25 MM CRYO, AORTIC VALVE HOMOGRAFT AND 30 MM HEMASHIELD PLATINUM GRAFT (N/A) CORONARY ARTERY BYPASS GRAFTING (CABG), ON PUMP, TIMES THREE, USING ENDOSCOPICALLY HARVESTED LEFT GREATER SAPHENOUS VEIN (N/A) TRANSESOPHAGEAL ECHOCARDIOGRAM (TEE) (N/A)  Subjective: Patient asked "when can I go home?". He has no specific compliant this am.  Objective: Vital signs in last 24 hours: Temp:  [98 F (36.7 C)-99.1 F (37.3 C)] 99.1 F (37.3 C) (05/14 0430) Pulse Rate:  [66-117] 95 (05/14 0430) Cardiac Rhythm: Atrial fibrillation (05/14 0248) Resp:  [17-32] 22 (05/14 0430) BP: (103-138)/(78-99) 130/87 (05/14 0430) SpO2:  [94 %-99 %] 99 % (05/14 0430) Weight:  [id number] kg] 138 kg (05/14 0430)  Pre op weight 132 kg Current Weight  10/04/19 (!) 138 kg      Intake/Output from previous day: 05/13 0701 - 05/14 0700 In: 343 [P.O.:240; I.V.:3; IV Piggyback:100] Out: 1000 [Urine:1000]   Physical Exam:  Cardiovascular: RRR Pulmonary: Slightly diminished bibasilar breath sounds Abdomen: Soft, non tender, bowel sounds present. Extremities: Hand and LE edema Wounds: Clean and dry.  No erythema or signs of infection.  Lab Results: CBC: Recent Labs    10/03/19 0414 10/04/19 0424  WBC 6.5 8.8  HGB 7.9* 8.1*  HCT 23.7* 24.1*  PLT 152 229   BMET:  Recent Labs    10/03/19 0414 10/04/19 0424  NA 137 137  K 5.1 5.0  CL 104 103  CO2 25 24  GLUCOSE 119* 100*  BUN 27* 26*  CREATININE 2.96* 2.69*  CALCIUM 8.4* 8.8*    PT/INR:  Lab Results  Component Value Date   INR 1.0 09/27/2019   INR 1.2 09/26/2019   INR 1.2 09/25/2019   PROTIME 15.9 11/06/2008   PROTIME 15.6 10/23/2008   ABG:  INR: Will add last result for INR, ABG once components are confirmed Will add last 4 CBG  results once components are confirmed  Assessment/Plan: 1. CV-He went into a fib with RVR the evening of 05/08. Has had periods of a fib. SR, first degree heart block this am with HR in the 90's to low 100's. On Amiodarone 400 mg bid and Lopressor 25 mg bid.As discussed with Dr. Cyndia Bent, will restart Coumadin and decrease ecasa to 81 mg. 2. Pulmonary-on room air. spirometer. Mucinex for cought/sputum 3. Volume overload-not on diuretic secondary to AKI.  4. DM-CBGs 122/132/88. On Insulin 20 units [place] daily but will decrease to avoid hypoglycemia. Pre op HGA1C 7.3. 5. ID-on Ceftriaxone for Strep bacteremia (Gentamycin discontinued secondary to AKI). Awaiting final cultures;no growth to date 6. Expected post op blood loss anemia-H and H this am slightly increased to 8.1 and 24.1 (s/p transfusion). On Trinsicon 7. AKI-Etiology likely ATN. Creatinine  decreased to 2.69 8. Hypothyroidism-continue Levothyroxine 50 mcg daily 9. Plasmacytoma-chemo/radiation to be considered once recovered from heart surgery 10. Potassium is 5 this am;will discuss with Dr. Cyndia Bent if able to diurese yet   Latrecia Capito M ZimmermanPA-C 10/04/2019,7:11 AM

## 2019-10-04 NOTE — Progress Notes (Signed)
Pt just to bed. Hasn't been sleeping and wants to rest. Pt practiced IS x5, 750 mL. Will f/u around lunch to ambulate. [place], ACSM 9:18 AM 10/04/2019

## 2019-10-04 NOTE — Plan of Care (Signed)
  Problem: Education: Goal: Knowledge of General Education information will improve Description Including pain rating scale, medication(s)/side effects and non-pharmacologic comfort measures Outcome: Progressing   Problem: Health Behavior/Discharge Planning: Goal: Ability to manage health-related needs will improve Outcome: Progressing   

## 2019-10-04 NOTE — Progress Notes (Signed)
Mobility Specialist - Progress Note   10/04/19 1300  Mobility  Activity Ambulated in hall  Level of Assistance Modified independent, requires aide device or extra time  Assistive Device Front wheel walker  Distance Ambulated (ft) 420 ft  Mobility Response Tolerated well  Mobility performed by Mobility specialist  $Mobility charge 1 Mobility    Pre-mobility: 83 HR,92% SpO2 Post-mobility: 101 HR,99% SPO2  Pt said he felt fatigued today due to lack of sleep. I left him lying in his bed with his phone and call alarm by his side. His family member was present with him.  [place] Specialist

## 2019-10-04 NOTE — Progress Notes (Signed)
Physical Therapy Treatment Patient Details Name: [name] MRN: [id number] DOB: Dec 04, 1965 Today's Date: 10/04/2019    History of Present Illness 54 yo admitted with endocarditis s/p redo aortic root replacement with CABG x 3. PMhx: L3-4 lami, CVA, aortic valve replacement, DM, CHF, CAD,HTN, HLD, cABG    PT Comments    Pt mildly nauseous today, but agreeable to OOB mobility as pt is motivated to go home. Pt ambulated great hallway distance with more appropriate HR response to activity vs last session, with HRmax this day of 118 bpm. PT reviewed sternal precautions with pt, who demonstrates good application of precautions during mobility. PT to continue to follow acutely.    Follow Up Recommendations  No PT follow up     Equipment Recommendations  Other (comment)    Recommendations for Other Services       Precautions / Restrictions Precautions Precautions: Fall;Sternal Precaution Comments: educated pt for sternal precautions - "move in the tube" Restrictions Weight Bearing Restrictions: Yes    Mobility  Bed Mobility Overal bed mobility: Needs Assistance             General bed mobility comments: up at EOB, requesting stay sitting EOB post-gait.  Transfers Overall transfer level: Needs assistance Equipment used: Rolling walker (2 wheeled) Transfers: Sit to/from Stand Sit to Stand: Supervision         General transfer comment: for safety, verbal cuing for hand placement on knees when rising (pt placed hands on RW initially to rise).  Ambulation/Gait Ambulation/Gait assistance: Supervision Gait Distance (Feet): 300 Feet Assistive device: Rolling walker (2 wheeled) Gait Pattern/deviations: Step-through pattern;Decreased stride length;Trunk flexed   Gait velocity interpretation: >2.62 ft/sec, indicative of community ambulatory General Gait Details: supervision for safety, verbal cuing for not leaning heavily on RW and upright posture. HR 89-118  bpm   Stairs Stairs: (pt declines to practice stairs today)           Wheelchair Mobility    Modified Rankin (Stroke Patients Only)       Balance Overall balance assessment: No apparent balance deficits (not formally assessed)                                          Cognition Arousal/Alertness: Awake/alert Behavior During Therapy: WFL for tasks assessed/performed Overall Cognitive Status: Within Functional Limits for tasks assessed                                 General Comments: Pt frustrated due to nausea, states "they were going to let me go today"      Exercises      General Comments        Pertinent Vitals/Pain Pain Assessment: Faces Faces Pain Scale: Hurts a little bit Pain Location: abdomen - nausea Pain Descriptors / Indicators: Discomfort Pain Intervention(s): Limited activity within patient's tolerance;Monitored during session;Repositioned    Home Living                      Prior Function            PT Goals (current goals can now be found in the care plan section) Acute Rehab PT Goals Patient Stated Goal: return to football PT Goal Formulation: With patient Time For Goal Achievement: 10/15/19 Potential to Achieve Goals: Good Progress towards PT goals: Progressing  toward goals    Frequency    Min 3X/week      PT Plan Current plan remains appropriate    Co-evaluation              AM-PAC PT "6 Clicks" Mobility   Outcome Measure  Help needed turning from your back to your side while in a flat bed without using bedrails?: A Little Help needed moving from lying on your back to sitting on the side of a flat bed without using bedrails?: A Little Help needed moving to and from a bed to a chair (including a wheelchair)?: None Help needed standing up from a chair using your arms (e.g., wheelchair or bedside chair)?: None Help needed to walk in hospital room?: A Little Help needed climbing  3-5 steps with a railing? : A Lot 6 Click Score: 19    End of Session Equipment Utilized During Treatment: Gait belt Activity Tolerance: Patient limited by fatigue;Patient limited by pain Patient left: in bed;with call bell/phone within reach;with family/visitor present Nurse Communication: Mobility status PT Visit Diagnosis: Other abnormalities of gait and mobility (R26.89)     Time: [id number] PT Time Calculation (min) (ACUTE ONLY): 21 min  Charges:  $Gait Training: 8-22 mins                     Alexa E, PT Acute Rehabilitation Services Pager [phone]  Office [phone]   Alexa D Elonda Husky 10/04/2019, 1:38 PM

## 2019-10-04 NOTE — Progress Notes (Signed)
   10/04/19 0248  Vitals  Temp 98.8 F (37.1 C)  Temp Source Oral  BP (!) 138/99  MAP (mmHg) 110  BP Location Left Arm  BP Method Automatic  Patient Position (if appropriate) Sitting  ECG Heart Rate (!) 104  Cardiac Rhythm Atrial fibrillation  Resp 18  Oxygen Therapy  SpO2 95 %  O2 Device Room Air  Pain Assessment  Pain Score 0  Patient wen into AFIB, patient denies SOB,denies chest pain, no sigh of distress noted, call bell within reach will continue to monitor.

## 2019-10-04 NOTE — Progress Notes (Signed)
Pt has ambulated x2 today. Discussed with pt and wife sternal precautions, IS, diet, exercise, and CRPII. Voiced understanding although pt is sleepy during education. Will refer to [place].  [place], ACSM 2:10 PM 10/04/2019

## 2019-10-05 LAB — RENAL FUNCTION PANEL
Albumin: 2.4 g/dL — ABNORMAL LOW (ref 3.5–5.0)
Anion gap: 8 (ref 5–15)
BUN: 30 mg/dL — ABNORMAL HIGH (ref 6–20)
CO2: 25 mmol/L (ref 22–32)
Calcium: 8.7 mg/dL — ABNORMAL LOW (ref 8.9–10.3)
Chloride: 102 mmol/L (ref 98–111)
Creatinine, Ser: 2.84 mg/dL — ABNORMAL HIGH (ref 0.61–1.24)
GFR calc Af Amer: 28 mL/min — ABNORMAL LOW (ref >60)
GFR calc non Af Amer: 24 mL/min — ABNORMAL LOW (ref >60)
Glucose, Bld: 125 mg/dL — ABNORMAL HIGH (ref 70–99)
Phosphorus: 4.8 mg/dL — ABNORMAL HIGH (ref 2.5–4.6)
Potassium: 5 mmol/L (ref 3.5–5.1)
Sodium: 135 mmol/L (ref 135–145)

## 2019-10-05 LAB — GLUCOSE, CAPILLARY
Glucose-Capillary: 123 mg/dL — ABNORMAL HIGH (ref 70–99)
Glucose-Capillary: 112 mg/dL — ABNORMAL HIGH (ref 70–99)
Glucose-Capillary: 117 mg/dL — ABNORMAL HIGH (ref 70–99)
Glucose-Capillary: 163 mg/dL — ABNORMAL HIGH (ref 70–99)

## 2019-10-05 LAB — PROTIME-INR
INR: 1.4 — ABNORMAL HIGH (ref 0.8–1.2)
Prothrombin Time: 16.6 seconds — ABNORMAL HIGH (ref 11.4–15.2)

## 2019-10-05 MED ORDER — METOPROLOL TARTRATE 50 MG PO TABS: 50.0000 mg | ORAL_TABLET | Freq: Two times a day (BID) | ORAL | Status: DC

## 2019-10-05 MED ORDER — METOPROLOL TARTRATE 25 MG PO TABS
25.0000 mg | ORAL_TABLET | Freq: Two times a day (BID) | ORAL | Status: DC
  Administered 2019-10-05 – 2019-10-07 (×5): 25 mg via ORAL
  Filled 2019-10-05 (×5): qty 1

## 2019-10-05 NOTE — Progress Notes (Signed)
CARDIAC REHAB PHASE I   PRE:  Rate/Rhythm: 90 SR  BP:  Sitting: 132/90        MODE:  Ambulation: 220 ft Rate/Rhythm:149 A FIB  POST:  Rate/Rhythm: 94 SR  BP:  Sitting: 146/86    Pt in chair, willing to walk. Pt demonstrated good sternal precuations. Pt ambulated 220 ft with RW. Pt went into A Fib while walking. Pt denied SOB, but stated he was tired. Pt returned back to recliner. Back in SR with rest. RN notified. Reminded pt to use IS and of sternal precuations.   [id number]   Carma Lair MS, ACSM CEP  9:30 AM 10/05/2019

## 2019-10-05 NOTE — Progress Notes (Addendum)
[address]       [place] [address]             [phone]      9 Days Post-Op Procedure(s) (LRB): REDO STERNOTOMY (N/A) REDO ASCENDING AORTIC ROOT REPLACEMENT USING LIFENET 25 MM CRYO, AORTIC VALVE HOMOGRAFT AND 30 MM HEMASHIELD PLATINUM GRAFT (N/A) CORONARY ARTERY BYPASS GRAFTING (CABG), ON PUMP, TIMES THREE, USING ENDOSCOPICALLY HARVESTED LEFT GREATER SAPHENOUS VEIN (N/A) TRANSESOPHAGEAL ECHOCARDIOGRAM (TEE) (N/A)   Subjective:  Up at sink getting washed up.  No complaints, hopeful to go home Monday  Objective: Vital signs in last 24 hours: Temp:  [97.3 F (36.3 C)-99.3 F (37.4 C)] 98.3 F (36.8 C) (05/15 0500) Pulse Rate:  [85-100] 100 (05/15 0500) Cardiac Rhythm: Normal sinus rhythm;Heart block (05/14 2000) Resp:  [15-20] 20 (05/15 0500) BP: (121-132)/(87-92) 129/89 (05/15 0500) SpO2:  [93 %-99 %] 94 % (05/15 0500) Weight:  [136.8 kg] 136.8 kg (05/15 0500)  Intake/Output from previous day: 05/14 0701 - 05/15 0700 In: -  Out: 1250 [Urine:1250]  General appearance: alert, cooperative and no distress Heart: irregularly irregular rhythm Lungs: clear to auscultation bilaterally Abdomen: soft, non-tender; bowel sounds normal; no masses,  no organomegaly Extremities: edema trace Wound: clean and dry  Lab Results: Recent Labs    10/03/19 0414 10/04/19 0424  WBC 6.5 8.8  HGB 7.9* 8.1*  HCT 23.7* 24.1*  PLT 152 229   BMET:  Recent Labs    10/04/19 0424 10/05/19 0309  NA 137 135  K 5.0 5.0  CL 103 102  CO2 24 25  GLUCOSE 100* 125*  BUN 26* 30*  CREATININE 2.69* 2.84*  CALCIUM 8.8* 8.7*    PT/INR:  Recent Labs    10/05/19 0309  LABPROT 16.6*  INR 1.4*   ABG    Component Value Date/Time   PHART 7.321 (L) 09/29/2019 0430   HCO3 21.3 09/29/2019 0430   TCO2 23 09/29/2019 0430   ACIDBASEDEF 5.0 (H) 09/29/2019 0430   O2SAT 64.0 09/30/2019 0408   CBG (last 3)  Recent Labs    10/04/19 1601 10/04/19 2125 10/05/19 0629    GLUCAP 146* 155* 123*    Assessment/Plan: S/P Procedure(s) (LRB): REDO STERNOTOMY (N/A) REDO ASCENDING AORTIC ROOT REPLACEMENT USING LIFENET 25 MM CRYO, AORTIC VALVE HOMOGRAFT AND 30 MM HEMASHIELD PLATINUM GRAFT (N/A) CORONARY ARTERY BYPASS GRAFTING (CABG), ON PUMP, TIMES THREE, USING ENDOSCOPICALLY HARVESTED LEFT GREATER SAPHENOUS VEIN (N/A) TRANSESOPHAGEAL ECHOCARDIOGRAM (TEE) (N/A)  1. CV- Atrial fibrillation rate controlled- continue Lopressor, Amiodarone, BP a little elevated but okay in setting of AKI 2. INR 1.4, will continue coumadin at 5 mg daily, which was part of his home regimen 3. Pulm- no acute issues, continue IS 4. Renal- creatinine bumped slightly to 2.84, likely due to Lasix, Zaroxolyn.. K at 5 no need for supplementation 5. Expected post operative anemia, last Hgb was 8.1, on iron 6. ID- continue Rocephin, home ABX use has been established 7. Hypothryoidism- on synthroid 8. Dispo- patient stable, in rate controlled A. Fib, slight bump in his creatinine due to diuretic use, will discuss further diuretics with Dr. Roxy Manns, repeat renal function in AM, continue ABX, coumadin.[name] if creatinine remains stable, likely ready for d/c hopefully Monday    LOS: 27 days    [place] Handler, PA-C  10/05/2019  I have seen and examined the patient and agree with the assessment and plan as outlined.  Hold diuretics for now.  Rexene Alberts, MD 10/05/2019 3:50  PM

## 2019-10-06 ENCOUNTER — Inpatient Hospital Stay: Payer: Self-pay

## 2019-10-06 ENCOUNTER — Inpatient Hospital Stay (HOSPITAL_COMMUNITY): Payer: Medicare PPO

## 2019-10-06 LAB — RENAL FUNCTION PANEL
Albumin: 2.3 g/dL — ABNORMAL LOW (ref 3.5–5.0)
Anion gap: 9 (ref 5–15)
BUN: 33 mg/dL — ABNORMAL HIGH (ref 6–20)
CO2: 25 mmol/L (ref 22–32)
Calcium: 8.8 mg/dL — ABNORMAL LOW (ref 8.9–10.3)
Chloride: 101 mmol/L (ref 98–111)
Creatinine, Ser: 2.75 mg/dL — ABNORMAL HIGH (ref 0.61–1.24)
GFR calc Af Amer: 29 mL/min — ABNORMAL LOW (ref >60)
GFR calc non Af Amer: 25 mL/min — ABNORMAL LOW (ref >60)
Glucose, Bld: 155 mg/dL — ABNORMAL HIGH (ref 70–99)
Phosphorus: 4.8 mg/dL — ABNORMAL HIGH (ref 2.5–4.6)
Potassium: 4.8 mmol/L (ref 3.5–5.1)
Sodium: 135 mmol/L (ref 135–145)

## 2019-10-06 LAB — GLUCOSE, CAPILLARY
Glucose-Capillary: 145 mg/dL — ABNORMAL HIGH (ref 70–99)
Glucose-Capillary: 163 mg/dL — ABNORMAL HIGH (ref 70–99)
Glucose-Capillary: 124 mg/dL — ABNORMAL HIGH (ref 70–99)
Glucose-Capillary: 169 mg/dL — ABNORMAL HIGH (ref 70–99)

## 2019-10-06 LAB — PROTIME-INR
INR: 1.6 — ABNORMAL HIGH (ref 0.8–1.2)
Prothrombin Time: 18.7 seconds — ABNORMAL HIGH (ref 11.4–15.2)

## 2019-10-06 IMAGING — DX DG CHEST 1V PORT
1 series · 1 of 1 positions shown · non-contrast
Comparison: 10/03/2019

CLINICAL DATA: PICC placement.  Follow-up exam.

EXAM:
PORTABLE CHEST 1 VIEW

[chest]
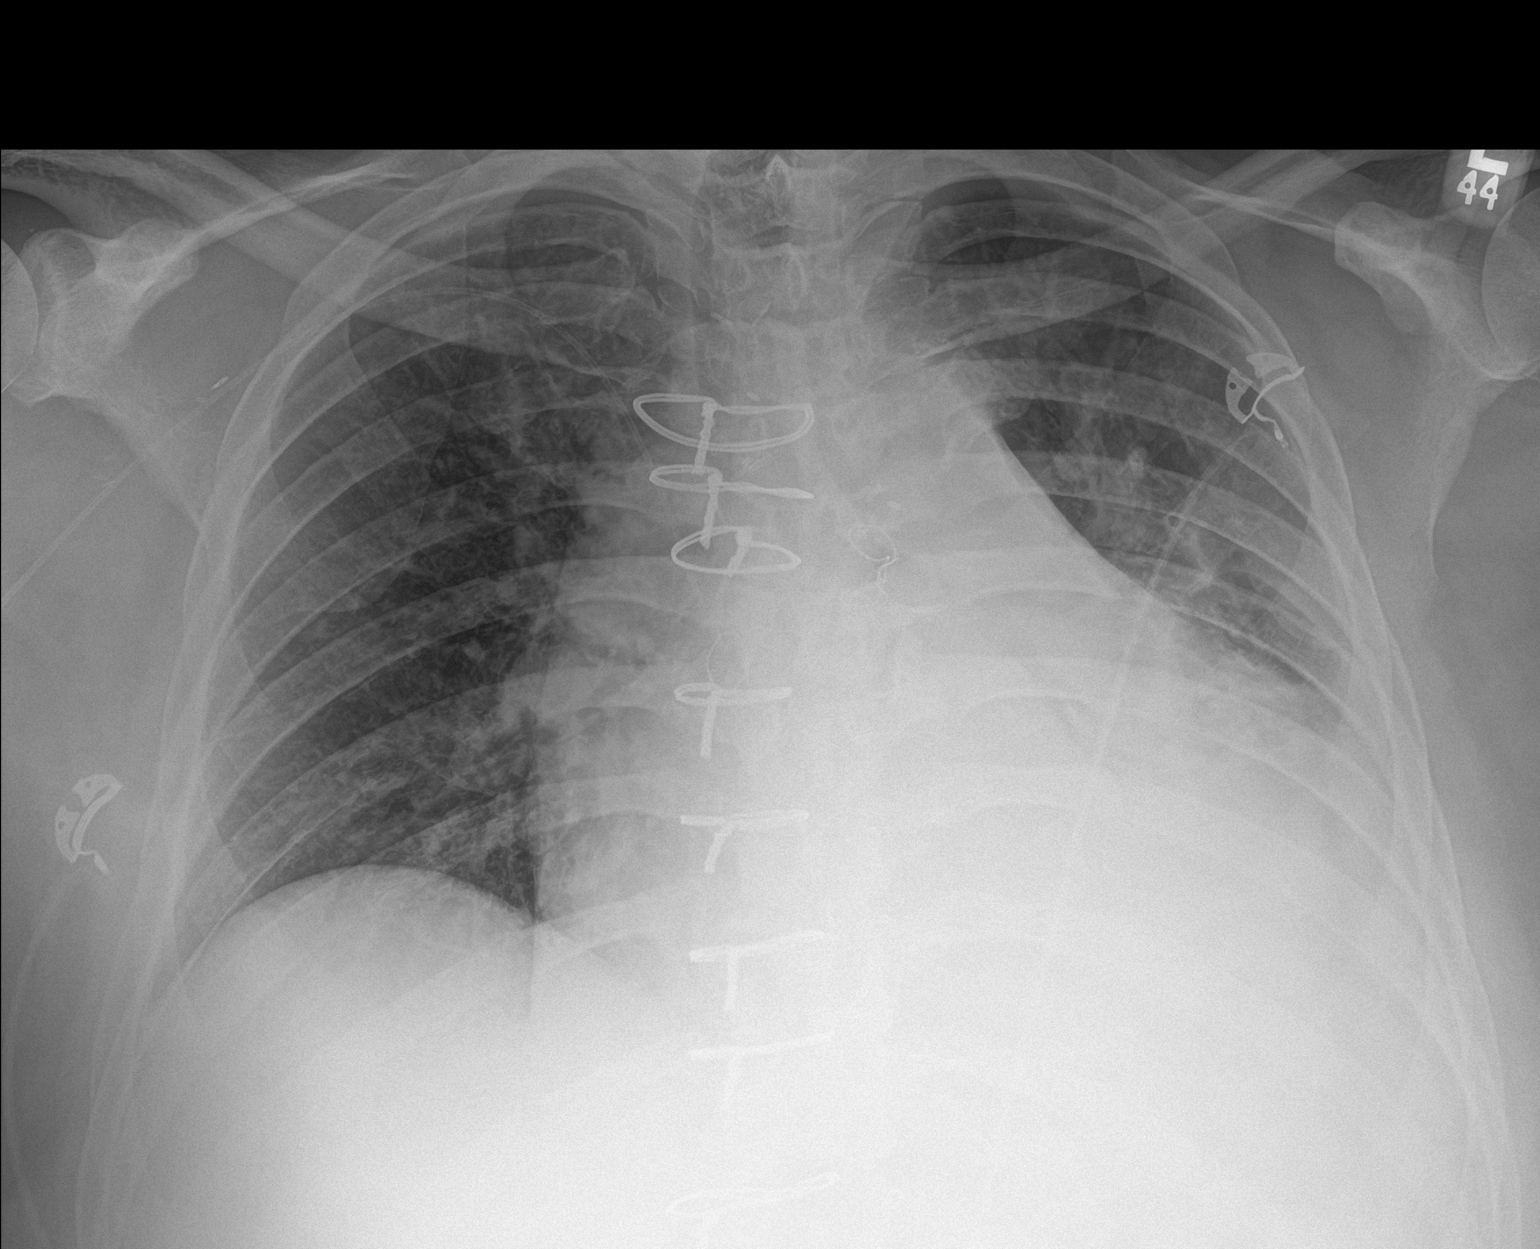

[1 of 1 positions shown; findings below may reference images not displayed]

FINDINGS: Right-sided PICC has its tip projecting in the mid superior vena
cava.

Stable cardiomegaly and changes from previous cardiac surgery. No
mediastinal or hilar masses.

Persistent opacity extends from the left perihilar region to the
left lung base obscuring the hemidiaphragm. Remainder of the lungs
is clear.

No pneumothorax.
IMPRESSION: 1. New right-sided PICC catheter tip projects in the mid superior
vena cava.
2. No other change from the prior study. Cardiomegaly and persistent
left perihilar to lung base opacity, the latter finding consistent
with atelectasis or infection.

## 2019-10-06 MED ORDER — SODIUM CHLORIDE 0.9% FLUSH
10.0000 mL | Freq: Two times a day (BID) | INTRAVENOUS | Status: DC
  Administered 2019-10-06 – 2019-10-07 (×2): 10 mL

## 2019-10-06 MED ORDER — SODIUM CHLORIDE 0.9% FLUSH: 10.0000 mL | INTRAVENOUS | Status: DC | PRN

## 2019-10-06 NOTE — Progress Notes (Signed)
Peripherally Inserted Central Catheter Placement  The IV Nurse has discussed with the patient and/or persons authorized to consent for the patient, the purpose of this procedure and the potential benefits and risks involved with this procedure.  The benefits include less needle sticks, lab draws from the catheter, and the patient may be discharged home with the catheter. Risks include, but not limited to, infection, bleeding, blood clot (thrombus formation), and puncture of an artery; nerve damage and irregular heartbeat and possibility to perform a PICC exchange if needed/ordered by physician.  Alternatives to this procedure were also discussed.  Bard Power PICC patient education guide, fact sheet on infection prevention and patient information card has been provided to patient /or left at bedside.    PICC Placement Documentation  PICC Single Lumen 10/06/19 PICC Right Brachial 44 cm 0 cm (Active)  Indication for Insertion or Continuance of Line Prolonged intravenous therapies 10/06/19 1059  Exposed Catheter (cm) 0 cm 10/06/19 1059  Site Assessment Clean;Dry;Intact 10/06/19 1059  Line Status Flushed;Saline locked;Blood return noted 10/06/19 1059  Dressing Type Transparent 10/06/19 1059  Dressing Status Clean;Dry;Intact;Antimicrobial disc in place 10/06/19 1059  Safety Lock Not Applicable [id number]  Line Care Connections checked and tightened 10/06/19 1059  Line Adjustment (NICU/IV Team Only) No 10/06/19 1059  Dressing Intervention New dressing 10/06/19 1059  Dressing Change Due 10/19/2019 10/06/19 1059       [name] 10/06/2019, 11:00 AM

## 2019-10-06 NOTE — Progress Notes (Signed)
Contacted by Ronalee Belts, that patient's Midline catheter is becoming uncomfortable.  The patient is now experiencing burning with administration of NS flushes.  He has contacted IV team.  I have consult PICC team for placement, as he will need prolonged IV therapy at discharge.  Ellwood Handler, PA-C

## 2019-10-06 NOTE — Progress Notes (Signed)
Patient ambulated approximately 750 ft in hallway with rolling walker on room air, patient tolerated well.

## 2019-10-06 NOTE — Progress Notes (Addendum)
      [address]       [place] [address]             [phone]      10 Days Post-Op Procedure(s) (LRB): REDO STERNOTOMY (N/A) REDO ASCENDING AORTIC ROOT REPLACEMENT USING LIFENET 25 MM CRYO, AORTIC VALVE HOMOGRAFT AND 30 MM HEMASHIELD PLATINUM GRAFT (N/A) CORONARY ARTERY BYPASS GRAFTING (CABG), ON PUMP, TIMES THREE, USING ENDOSCOPICALLY HARVESTED LEFT GREATER SAPHENOUS VEIN (N/A) TRANSESOPHAGEAL ECHOCARDIOGRAM (TEE) (N/A)   Subjective:  Patient up moving around room.  Just finished cleaning up.  Really hoping to go home tomorrow.   Objective: Vital signs in last 24 hours: Temp:  [97.9 F (36.6 C)-98.6 F (37 C)] 98.1 F (36.7 C) (05/16 0417) Pulse Rate:  [86-108] 86 (05/16 0417) Cardiac Rhythm: Normal sinus rhythm;Heart block (05/15 1900) Resp:  [18-23] 18 (05/16 0417) BP: (112-129)/(80-89) 129/86 (05/16 0417) SpO2:  [92 %-100 %] 100 % (05/16 0417) Weight:  [136.6 kg] 136.6 kg (05/16 0417)  Intake/Output from previous day: 05/15 0701 - 05/16 0700 In: 360 [P.O.:360] Out: 950 [Urine:950]  General appearance: alert, cooperative and no distress Heart: regular rate and rhythm Lungs: clear to auscultation bilaterally Abdomen: soft, non-tender; bowel sounds normal; no masses,  no organomegaly Extremities: edema 1+ pitting Wound: clean and dry  Lab Results: Recent Labs    10/04/19 0424  WBC 8.8  HGB 8.1*  HCT 24.1*  PLT 229   BMET:  Recent Labs    10/05/19 0309 10/06/19 0305  NA 135 135  K 5.0 4.8  CL 102 101  CO2 25 25  GLUCOSE 125* 155*  BUN 30* 33*  CREATININE 2.84* 2.75*  CALCIUM 8.7* 8.8*    PT/INR:  Recent Labs    10/06/19 0305  LABPROT 18.7*  INR 1.6*   ABG    Component Value Date/Time   PHART 7.321 (L) 09/29/2019 0430   HCO3 21.3 09/29/2019 0430   TCO2 23 09/29/2019 0430   ACIDBASEDEF 5.0 (H) 09/29/2019 0430   O2SAT 64.0 09/30/2019 0408   CBG (last 3)  Recent Labs    10/05/19 1612 10/05/19 2101 10/06/19 0611    GLUCAP 117* 163* 145*    Assessment/Plan: S/P Procedure(s) (LRB): REDO STERNOTOMY (N/A) REDO ASCENDING AORTIC ROOT REPLACEMENT USING LIFENET 25 MM CRYO, AORTIC VALVE HOMOGRAFT AND 30 MM HEMASHIELD PLATINUM GRAFT (N/A) CORONARY ARTERY BYPASS GRAFTING (CABG), ON PUMP, TIMES THREE, USING ENDOSCOPICALLY HARVESTED LEFT GREATER SAPHENOUS VEIN (N/A) TRANSESOPHAGEAL ECHOCARDIOGRAM (TEE) (N/A)  1. CV- PAF, currently in NSR- continue Amiodarone and Lopressor 2. INR 1.6, continue coumadin at 5 mg daily 3. Pulm- no acute issues, off oxygen, continue IS 4. Renal- AKI, creatinine down to 2.75, will again hold diuretics today, K at 4.8 5. Expected post operative blood loss anemia, continue iron 6. ID- remains afebrile, continue ABX per ID recs, home use is arranged 7. Hypothyroidism- continue synthroid 8. DM- cbgs controlled, patient will likely require insulin at discharge until creatinine improves to restart home oral agents 9. Dispo- patient stable, in NSR this morning, continue coumadin, creatinine slightly improved today continue to hold diuretics, continue ABX, patient hoping to be discharged soon   LOS: 28 days    Ellwood Handler, PA-C  10/06/2019   I have seen and examined the patient and agree with the assessment and plan as outlined.  Looks good.  Anticipate possible d/c home tomorrow.   Rexene Alberts, MD 10/06/2019 12:09 PM

## 2019-10-06 NOTE — Progress Notes (Signed)
Spoke with Ronalee Belts re PICC order.  Will notify prior to arrival.

## 2019-10-07 ENCOUNTER — Other Ambulatory Visit: Payer: Self-pay | Admitting: Physician Assistant

## 2019-10-07 ENCOUNTER — Other Ambulatory Visit: Payer: Self-pay | Admitting: *Deleted

## 2019-10-07 DIAGNOSIS — I079 Rheumatic tricuspid valve disease, unspecified: Secondary | ICD-10-CM

## 2019-10-07 LAB — RENAL FUNCTION PANEL
Albumin: 2.2 g/dL — ABNORMAL LOW (ref 3.5–5.0)
Anion gap: 6 (ref 5–15)
BUN: 29 mg/dL — ABNORMAL HIGH (ref 6–20)
CO2: 27 mmol/L (ref 22–32)
Calcium: 8.6 mg/dL — ABNORMAL LOW (ref 8.9–10.3)
Chloride: 102 mmol/L (ref 98–111)
Creatinine, Ser: 2.48 mg/dL — ABNORMAL HIGH (ref 0.61–1.24)
GFR calc Af Amer: 33 mL/min — ABNORMAL LOW (ref >60)
GFR calc non Af Amer: 28 mL/min — ABNORMAL LOW (ref >60)
Glucose, Bld: 127 mg/dL — ABNORMAL HIGH (ref 70–99)
Phosphorus: 4.3 mg/dL (ref 2.5–4.6)
Potassium: 4.5 mmol/L (ref 3.5–5.1)
Sodium: 135 mmol/L (ref 135–145)

## 2019-10-07 LAB — GLUCOSE, CAPILLARY
Glucose-Capillary: 123 mg/dL — ABNORMAL HIGH (ref 70–99)
Glucose-Capillary: 112 mg/dL — ABNORMAL HIGH (ref 70–99)

## 2019-10-07 LAB — ECHO INTRAOPERATIVE TEE
Height: 74 in
Weight: 4657.6 oz

## 2019-10-07 LAB — PROTIME-INR
INR: 1.9 — ABNORMAL HIGH (ref 0.8–1.2)
Prothrombin Time: 21.5 seconds — ABNORMAL HIGH (ref 11.4–15.2)

## 2019-10-07 MED ORDER — METOPROLOL SUCCINATE ER 50 MG PO TB24: 50.0000 mg | ORAL_TABLET | Freq: Every day | ORAL | 1 refills | Status: AC

## 2019-10-07 MED ORDER — AMIODARONE HCL 200 MG PO TABS: 200.0000 mg | ORAL_TABLET | Freq: Two times a day (BID) | ORAL | 1 refills | Status: DC

## 2019-10-07 MED ORDER — AMIODARONE HCL 200 MG PO TABS: 200.0000 mg | ORAL_TABLET | Freq: Two times a day (BID) | ORAL | 1 refills | Status: AC

## 2019-10-07 MED ORDER — CEFTRIAXONE IV (FOR PTA / DISCHARGE USE ONLY)
2.0000 g | INTRAVENOUS | 0 refills | Status: AC
Start: 2019-10-07 — End: 2019-11-12

## 2019-10-07 MED ORDER — FE FUMARATE-B12-VIT C-FA-IFC PO CAPS: 1.0000 | ORAL_CAPSULE | Freq: Every day | ORAL | Status: DC

## 2019-10-07 MED ORDER — INSULIN ASPART 100 UNIT/ML ~~LOC~~ SOLN: 0.0000 [IU] | Freq: Three times a day (TID) | SUBCUTANEOUS | 11 refills | Status: DC

## 2019-10-07 MED ORDER — HEPARIN SOD (PORK) LOCK FLUSH 100 UNIT/ML IV SOLN
250.0000 [IU] | INTRAVENOUS | Status: AC | PRN
  Administered 2019-10-07: 250 [IU]
  Filled 2019-10-07: qty 2.5

## 2019-10-07 MED ORDER — GUAIFENESIN ER 600 MG PO TB12: 600.0000 mg | ORAL_TABLET | Freq: Two times a day (BID) | ORAL | Status: AC | PRN

## 2019-10-07 MED ORDER — TRAMADOL HCL 50 MG PO TABS: 50.0000 mg | ORAL_TABLET | Freq: Two times a day (BID) | ORAL | 0 refills | Status: AC | PRN

## 2019-10-07 MED ORDER — INSULIN DETEMIR 100 UNIT/ML ~~LOC~~ SOLN: 18.0000 [IU] | Freq: Every day | SUBCUTANEOUS | 11 refills | Status: AC

## 2019-10-07 MED ORDER — BLOOD GLUCOSE MONITOR KIT: PACK | 10 refills | Status: AC

## 2019-10-07 NOTE — Progress Notes (Signed)
Inpatient Diabetes Program Recommendations  AACE/ADA: New Consensus Statement on Inpatient Glycemic Control (2015)  Target Ranges:  Prepandial:   less than 140 mg/dL      Peak postprandial:   less than 180 mg/dL (1-2 hours)      Critically ill patients:  140 - 180 mg/dL   Lab Results  Component Value Date   GLUCAP 123 (H) 10/07/2019   HGBA1C 7.3 (H) 09/26/2019    Review of Glycemic Control  Diabetes history: DM 2 Outpatient Diabetes medications: Amaryl 3 mg daily with breakfast, Metformin 1000 mg bid  Current orders for Inpatient glycemic control:  Levemir 18 units Daily Novolog 0-24 units tid + hs  Consult for insulin teaching for d/c due to renal function  Inpatient Diabetes Program Recommendations:    Levemir preferred unsure of copay  Novolog preferred unsure of copay (If copay too much, pt can call back and can do [employer] [facility] and Regular insulin can dose if needed) Placed TOC consult in for benefits check of insulins  Will see pt for insulin pen teaching. Will ask pt about affordability of insulin.   1217 pm   Saw pt at bedside. Pt has been on vial and syringe I the past. Showed pt how to use insulin pen. Pt reports prescriptions usually being covered between $3-6 each. Pt reports good medication coverage. Will plan on insulin pens at time of d/c. Told pt if prescriptions are expensive to call unit back and we can possibly place him on different type of insulin.  Levemir Flextouch insulin pen order # [id number] Novolog Flexpen insulin pen order # [id number] Insulin pen needles order # [phone] (4 injections a day will need at least 120 pen needles)   Thanks,  Tama Headings RN, MSN, BC-ADM Inpatient Diabetes Coordinator Team Pager [phone] (8a-5p)

## 2019-10-07 NOTE — Care Management Important Message (Signed)
Important Message  Patient Details  Name: [name] MRN: [id number] Date of Birth: 18-Jul-1965   Medicare Important Message Given:  Yes     Shelda Altes 10/07/2019, 10:49 AM

## 2019-10-07 NOTE — Progress Notes (Signed)
PT Cancellation Note  Patient Details Name: [name] MRN: [id number] DOB: [birth year]   Cancelled Treatment:    Reason Eval/Treat Not Completed: Other (comment).  Pt is declining over being on IV which per his report he cannot walk and receive, and then is expecting to leave for home.  Retry at another time.   Ramond Dial 10/07/2019, 11:21 AM   Mee Hives, PT MS Acute Rehab Dept. Number: [place] and [place]

## 2019-10-07 NOTE — Progress Notes (Signed)
CARDIAC REHAB PHASE I   Offered to walk with pt. Pt states he plans to take a wash up and walk after. Pt states he is leaving today and is grateful. Reinforced d/c education. Pt states wife took DME home. Deny further questions or concerns. Pt referred to CRP II Lewis Rufina Falco, RN BSN 10/07/2019 9:22 AM

## 2019-10-07 NOTE — Progress Notes (Signed)
Brief Oncology Note:  Message sent to [facility] schedulers to arrange for outpatient follow-up next week. The patient will be notified with the date and time.  Mikey Bussing, DNP, AGPCNP-BC, AOCNP Mon/Tues/Thurs/Fri 7am-5pm; Off Wednesdays Cell: [phone]

## 2019-10-07 NOTE — Progress Notes (Addendum)
      [address]       [place] [address]             [phone]        11 Days Post-Op Procedure(s) (LRB): REDO STERNOTOMY (N/A) REDO ASCENDING AORTIC ROOT REPLACEMENT USING LIFENET 25 MM CRYO, AORTIC VALVE HOMOGRAFT AND 30 MM HEMASHIELD PLATINUM GRAFT (N/A) CORONARY ARTERY BYPASS GRAFTING (CABG), ON PUMP, TIMES THREE, USING ENDOSCOPICALLY HARVESTED LEFT GREATER SAPHENOUS VEIN (N/A) TRANSESOPHAGEAL ECHOCARDIOGRAM (TEE) (N/A)  Subjective: Patient is hoping to go home this am. He states "new IV feels much better than previous one".  Objective: Vital signs in last 24 hours: Temp:  [97.8 F (36.6 C)-98.4 F (36.9 C)] 98.4 F (36.9 C) (05/17 0408) Pulse Rate:  [72-89] 78 (05/16 2350) Cardiac Rhythm: Heart block (05/16 2350) Resp:  [18-22] 20 (05/17 0408) BP: (114-138)/(78-90) 138/78 (05/17 0408) SpO2:  [95 %-100 %] 97 % (05/17 0408) Weight:  [134 kg] 134 kg (05/17 0408)  Pre op weight 132 kg Current Weight  10/07/19 134 kg      Intake/Output from previous day: 05/16 0701 - 05/17 0700 In: 480 [P.O.:480] Out: 1150 [Urine:1150]   Physical Exam:  Cardiovascular: RRR Pulmonary: Slightly diminished bibasilar breath sounds Abdomen: Soft, non tender, bowel sounds present. Extremities: Mild LE edema Wounds: Clean and dry.  No erythema or signs of infection.  Lab Results: CBC: No results for input(s): WBC, HGB, HCT, PLT in the last 72 hours. BMET:  Recent Labs    10/06/19 0305 10/07/19 0252  NA 135 135  K 4.8 4.5  CL 101 102  CO2 25 27  GLUCOSE 155* 127*  BUN 33* 29*  CREATININE 2.75* 2.48*  CALCIUM 8.8* 8.6*    PT/INR:  Lab Results  Component Value Date   INR 1.9 (H) 10/07/2019   INR 1.6 (H) 10/06/2019   INR 1.4 (H) 10/05/2019   PROTIME 15.9 11/06/2008   PROTIME 15.6 10/23/2008   ABG:  INR: Will add last result for INR, ABG once components are confirmed Will add last 4 CBG results once components are  confirmed  Assessment/Plan: 1. CV-He went into a fib with RVR the evening of 05/08. Has had PAF. SR, first degree heart block this am with HR in the 80's. On Amiodarone 400 mg bid and Lopressor 25 mg bid and Coumadin. INR this am slightly increased to 1.9 2. Pulmonary-on room air. spirometer. Mucinex for cought/sputum 3. Volume overload-has been given 1 dose of Lasix on Friday 4. DM-CBGs 124/169/123. On Insulin 18 units [place] daily. Pre op HGA1C 7.3. He will need to stay on Insulin until creatinine is WNL before restarting oral diabetic medications. 5. ID-on Ceftriaxone for Strep bacteremia (Gentamycin discontinued secondary to AKI). Awaiting final cultures;no growth to date 6. Expected post op blood loss anemia-H and H this am slightly increased to 8.1 and 24.1 (previous transfusion). On Trinsicon 7. AKI-Etiology likely ATN. Creatinine  decreased to 2.48 this am 8. Hypothyroidism-continue Levothyroxine 50 mcg daily 9. Plasmacytoma-chemo/radiation to be considered once recovered from heart surgery 10. New PICC line placed yesterday as midline was hurting him. [place] to discharge  Lori Popowski M ZimmermanPA-C 10/07/2019,7:02 AM

## 2019-10-07 NOTE — Care Management (Signed)
Per [facility]   Levemir Flex Touch insulin pen 18 unit daily $9.20 retail or [place] for 30 or 90 day supply.  Novolog Flextouch insulin pen  2 unit 3 times a day $9.20 retail or [place] for 30 or 90 day supply.  PA required @ [phone] Deductible not met Tier 3 Retail Pharmacy: [facility].

## 2019-10-07 NOTE — Progress Notes (Signed)
Progress Note  Patient Name: [name] Date of Encounter: 10/07/2019  Primary Cardiologist: Kirk Ruths, MD   Subjective   Hopeful to be discharged soon.  Denies chest pain. Inpatient Medications    Scheduled Meds: . sodium chloride   Intravenous Once  . amiodarone  400 mg Oral BID  . aspirin EC  81 mg Oral Daily   Or  . aspirin  81 mg Per Tube Daily  . atorvastatin  40 mg Oral Q breakfast  . Chlorhexidine Gluconate Cloth  6 each Topical Daily  . diphenhydrAMINE  25 mg Oral QHS  . docusate sodium  200 mg Oral Daily  . feeding supplement  1 Container Oral BID BM  . feeding supplement (ENSURE ENLIVE)  237 mL Oral BID BM  . ferrous [id number] C-folic acid  1 capsule Oral BID PC  . gabapentin  300 mg Oral Q breakfast  . guaiFENesin  600 mg Oral BID  . insulin aspart  0-24 Units Subcutaneous TID AC & HS  . insulin detemir  18 Units Subcutaneous Daily  . levothyroxine  50 mcg Oral Q0600  . magic mouthwash  15 mL Oral TID  . mouth rinse  15 mL Mouth Rinse BID  . metoprolol tartrate  25 mg Oral BID  . pantoprazole  40 mg Oral Daily  . sodium chloride flush  10-40 mL Intracatheter Q12H  . warfarin  5 mg Oral q1600  . Warfarin - Physician Dosing Inpatient   Does not apply q1600   Continuous Infusions: . sodium chloride 20 mL/hr at 09/27/19 0200  . cefTRIAXone (ROCEPHIN)  IV 2 g (10/07/19 1036)  . lactated ringers Stopped (10/01/19 2306)   PRN Meds: dextrose, levalbuterol, ondansetron (ZOFRAN) IV, oxyCODONE, sodium chloride flush, sodium chloride flush, traMADol   Vital Signs    Vitals:   10/07/19 0023 10/07/19 0408 10/07/19 0724 10/07/19 1108  BP:  138/78 (!) 132/92 (!) 123/91  Pulse:   90 87  Resp: 19 20 18 18   Temp:  98.4 F (36.9 C) 98 F (36.7 C) 98 F (36.7 C)  TempSrc:  Oral Oral Oral  SpO2:  97% 97% 99%  Weight:  134 kg    Height:        Intake/Output Summary (Last 24 hours) at 10/07/2019 1316 Last data filed at 10/07/2019 0731 Gross  per 24 hour  Intake 120 ml  Output 1300 ml  Net -1180 ml   Last 3 Weights 10/07/2019 10/06/2019 10/05/2019  Weight (lbs) 295 lb 6.4 oz 301 lb 3.2 oz 301 lb 8 oz  Weight (kg) 133.993 kg 136.623 kg 136.76 kg      Telemetry    Sinus rhythm- Personally Reviewed  Physical Exam   GEN: No acute distress.   Neck: No JVD Cardiac: RRR, no murmurs, rubs, or gallops.  Respiratory: Clear to auscultation bilaterally. GI: Soft, nontender, non-distended  MS: No edema; No deformity. Neuro:  Nonfocal  Psych: Normal affect   Labs    High Sensitivity Troponin:   Recent Labs  Lab 09/08/19 0810 09/08/19 1100  TROPONINIHS 34* 28*      Chemistry Recent Labs  Lab 10/05/19 0309 10/06/19 0305 10/07/19 0252  NA 135 135 135  K 5.0 4.8 4.5  CL 102 101 102  CO2 25 25 27   GLUCOSE 125* 155* 127*  BUN 30* 33* 29*  CREATININE 2.84* 2.75* 2.48*  CALCIUM 8.7* 8.8* 8.6*  ALBUMIN 2.4* 2.3* 2.2*  GFRNONAA 24* 25* 28*  GFRAA 28* 29* 33*  ANIONGAP 8 9 6      Hematology Recent Labs  Lab 10/02/19 1613 10/03/19 0414 10/04/19 0424  WBC 7.4 6.5 8.8  RBC 2.73* 2.74* 2.76*  HGB 7.9* 7.9* 8.1*  HCT 23.9* 23.7* 24.1*  MCV 87.5 86.5 87.3  MCH 28.9 28.8 29.3  MCHC 33.1 33.3 33.6  RDW 16.3* 16.0* 16.1*  PLT 124* 152 229    BNPNo results for input(s): BNP, PROBNP in the last 168 hours.   DDimer No results for input(s): DDIMER in the last 168 hours.   Radiology    DG CHEST PORT 1 VIEW  Result Date: 10/06/2019 CLINICAL DATA:  PICC placement.  Follow-up exam. EXAM: PORTABLE CHEST 1 VIEW COMPARISON:  10/03/2019 FINDINGS: Right-sided PICC has its tip projecting in the mid superior vena cava. Stable cardiomegaly and changes from previous cardiac surgery. No mediastinal or hilar masses. Persistent opacity extends from the left perihilar region to the left lung base obscuring the hemidiaphragm. Remainder of the lungs is clear. No pneumothorax. IMPRESSION: 1. New right-sided PICC catheter tip projects in  the mid superior vena cava. 2. No other change from the prior study. Cardiomegaly and persistent left perihilar to lung base opacity, the latter finding consistent with atelectasis or infection. Electronically Signed   By: Lajean Manes M.D.   On: 10/06/2019 11:42   [place] EKG SITE RITE  Result Date: 10/06/2019 If Site Rite image not attached, placement could not be confirmed due to current cardiac rhythm.  Patient Profile     54 y.o. male with endocarditis status post aortic valve replacement and paroxysmal atrial fibrillation.  Assessment & Plan    Paroxysmal atrial fibrillation -Stable sinus rhythm on amiodarone.  Has received 7 days of amiodarone 400 twice daily.  On discharge, okay to discharge on amiodarone 200 mg a day.  Postop aortic valve replacement -Monitor with echocardiogram after first office visit.  Aortic valve endocarditis -Ceftriaxone-strep bacteremia.      CARDIOLOGY RECOMMENDATIONS:  Discharge is anticipated in the next 48 hours. Recommendations for medications and follow up:  Discharge Medications: Continue medications as they are currently listed in the [facility]. Exceptions to the above:  Has received 7 days of 400 mg BID amiodarone, OK to discharge on 200 mg daily  Follow Up: The patient's Primary Cardiologist is Kirk Ruths, MD  Follow up in the office in 2 week(s).   Post-op course complicated by Afib RVR, converted on amiodarone, maintained sinus rhythm. Cardiology follow up has been arranged, as well as coumadin clinic appt for home coumadin regimen for PAF. Will need repeat echo for AVR in 6 weeks.    Hilarie Fredrickson [place], [place]  10:59 AM 10/07/2019  [facility]  Candee Furbish, MD

## 2019-10-07 NOTE — TOC Transition Note (Addendum)
Transition of Care [facility]) - CM/SW Discharge Note Marvetta Gibbons RN, BSN Transitions of Care Unit 4E- RN Case Manager [phone]   Patient Details  Name: [name] MRN: [id number] Date of Birth: 04/14/1966  Transition of Care [facility]) CM/SW Contact:  Dawayne Patricia, RN Phone Number: 10/07/2019, 2:19 PM   Clinical Narrative:    Pt stable for transition home today, home IV abx arrangements have been made with [facility] with [facility] services by Elliot Gault with advanced has provided bedside teaching and home IV abx will be delivered to home. Alvis Lemmings will plan to do start of care in the home tomorrow am. Have spoken with both Pam with [facility] and Jenny Reichmann with Alvis Lemmings to confirm plan. Pt has received today's abx dose here prior to discharge.  DME RW and 3n1 has been delivered to the room per [facility].  Benefits check submitted and pending for Insulin needs   Final next level of care: [place] Barriers to Discharge: No Barriers Identified, Barriers Resolved   Patient Goals and CMS Choice Patient states their goals for this hospitalization and ongoing recovery are:: TO return home, needs walker and [facility] CMS Medicare.gov Compare Post Acute Care list provided to:: Patient Choice offered to / list presented to : Patient  Discharge Placement               Home with [facility]        Discharge Plan and Services In-house Referral: NA Discharge Planning Services: CM Consult Post Acute Care Choice: Home Health          DME Arranged: 3-N-1, Gilford Rile DME Agency: [facility] Care(working with [facility] infusion) Date DME Agency Contacted: 10/03/19 Time DME Agency Contacted: [phone] Representative spoke with at DME Agency: [place]: RN(In association with [facility] infusion services) [place] Agency: [place] Date [facility] Agency Contacted: 10/03/19 Time HH Agency Contacted: [phone]    Social Determinants of Health (SDOH) Interventions      Readmission Risk Interventions Readmission Risk Prevention Plan 10/07/2019 09/18/2019  Transportation Screening Complete Complete  HRI or [place] - Complete  Social Work Consult for [place] Planning/Counseling - Complete  Palliative Care Screening - Not Applicable  Medication Review Press photographer) Complete Complete  PCP or Specialist appointment within 3-5 days of discharge Complete -  [place] or Home Care Consult Complete -  SW Recovery Care/Counseling Consult Complete -  Palliative Care Screening Not Applicable -  [place] Not Applicable -  Some recent data might be hidden

## 2019-10-08 ENCOUNTER — Telehealth: Payer: Self-pay | Admitting: Pharmacist

## 2019-10-08 ENCOUNTER — Telehealth (HOSPITAL_COMMUNITY): Payer: Self-pay

## 2019-10-08 ENCOUNTER — Telehealth: Payer: Self-pay | Admitting: Internal Medicine

## 2019-10-08 DIAGNOSIS — I509 Heart failure, unspecified: Secondary | ICD-10-CM | POA: Diagnosis not present

## 2019-10-08 DIAGNOSIS — E119 Type 2 diabetes mellitus without complications: Secondary | ICD-10-CM | POA: Diagnosis not present

## 2019-10-08 DIAGNOSIS — B954 Other streptococcus as the cause of diseases classified elsewhere: Secondary | ICD-10-CM | POA: Diagnosis not present

## 2019-10-08 DIAGNOSIS — I38 Endocarditis, valve unspecified: Secondary | ICD-10-CM | POA: Diagnosis not present

## 2019-10-08 DIAGNOSIS — I0981 Rheumatic heart failure: Secondary | ICD-10-CM | POA: Diagnosis not present

## 2019-10-08 DIAGNOSIS — I480 Paroxysmal atrial fibrillation: Secondary | ICD-10-CM | POA: Diagnosis not present

## 2019-10-08 DIAGNOSIS — I330 Acute and subacute infective endocarditis: Secondary | ICD-10-CM | POA: Diagnosis not present

## 2019-10-08 DIAGNOSIS — R7881 Bacteremia: Secondary | ICD-10-CM | POA: Diagnosis not present

## 2019-10-08 DIAGNOSIS — I2510 Atherosclerotic heart disease of native coronary artery without angina pectoris: Secondary | ICD-10-CM | POA: Diagnosis not present

## 2019-10-08 DIAGNOSIS — Z48812 Encounter for surgical aftercare following surgery on the circulatory system: Secondary | ICD-10-CM | POA: Diagnosis not present

## 2019-10-08 DIAGNOSIS — I700 Atherosclerosis of aorta: Secondary | ICD-10-CM | POA: Diagnosis not present

## 2019-10-08 NOTE — Telephone Encounter (Signed)
Attempted to call patient in regards to Cardiac Rehab - LM on VM 

## 2019-10-08 NOTE — Telephone Encounter (Signed)
Beth with [facility] called this morning and was wondering if they could get verbal orders for nursing twice a week and once a week for seven weeks for [facility] care and IV ances, cardiac and diabetes management. Also was wondering about an order for Physical therapy evaluation, and was also wondering if the following prescriptions could be called in  a Gabapentin, Foltrin, cholecalciferol, Ergocal.

## 2019-10-08 NOTE — Telephone Encounter (Signed)
Pt insurance is active and benefits verified through [name]. Co-pay $20.00, DED $0.00/$0.00 met, out of pocket $6,650.00/$50.00 met, co-insurance 0%. No pre-authorization required. Passport, 10/08/19 @ 2:40PM, [id number]  Will contact patient to see if he is interested in the Cardiac Rehab Program. If interested, patient will need to complete follow up appt. Once completed, patient will be contacted for scheduling upon review by the RN Navigator.

## 2019-10-08 NOTE — Telephone Encounter (Addendum)
Beth from [place] called to see what dose of warfarin patient should be home. He was d/c from hospital yesterday around [place]. He did not take coumadin yesterday. Yesterday AM his INR was 1.9 (goal 2.5-3.5). I will have advised beth to have patient take 7.5mg  today and tomorrow since he missed dose yesterday. Looks like he was started on warfarin 5mg  daily on 10/04/19 (home regimen was 7.5mg  daily except 5mg  on MWF) Patient will probably need a lower home dose due to starting amio. RN will check INR on Thursday It appears mechanical aortic valve was replaced with tissue valve. Pt w/ afib and stroke. Can decrease INR goal to 2-3 since patient no longer has mechanical valve

## 2019-10-09 ENCOUNTER — Telehealth: Payer: Self-pay | Admitting: Internal Medicine

## 2019-10-09 NOTE — Telephone Encounter (Signed)
New Message:   Pt's wife is calling and states she would like a call from the assist. Pt's wife wouldn't give any more details into what it is about. Please advise.

## 2019-10-09 NOTE — Telephone Encounter (Signed)
Patient's wife is calling back again. &Would like a call back before the day is over.  Informed patient's wife, the CMA is in a room and a message will be sent.

## 2019-10-09 NOTE — Telephone Encounter (Signed)
Requesting a call back on 558 [phone]

## 2019-10-09 NOTE — Telephone Encounter (Signed)
Alvis Lemmings is calling again requesting the orders. She states this is very important and is needed as ASAP!

## 2019-10-09 NOTE — Telephone Encounter (Signed)
I am not sure of the specific questions involved, but I am unable to assist with any apparently for now at any rate, as the DISCHARGE summary for the recent hospn has not been completed by the provider responsible for this.  I am not sure how to remedy this, except for family or [place] to inquire as to the provider responsible for this, as I suspect this should answer all questions.

## 2019-10-10 ENCOUNTER — Ambulatory Visit (INDEPENDENT_AMBULATORY_CARE_PROVIDER_SITE_OTHER): Payer: Medicare PPO | Admitting: Interventional Cardiology

## 2019-10-10 ENCOUNTER — Telehealth: Payer: Self-pay | Admitting: Cardiology

## 2019-10-10 DIAGNOSIS — Z48812 Encounter for surgical aftercare following surgery on the circulatory system: Secondary | ICD-10-CM | POA: Diagnosis not present

## 2019-10-10 DIAGNOSIS — Z5181 Encounter for therapeutic drug level monitoring: Secondary | ICD-10-CM

## 2019-10-10 DIAGNOSIS — I700 Atherosclerosis of aorta: Secondary | ICD-10-CM | POA: Diagnosis not present

## 2019-10-10 DIAGNOSIS — I509 Heart failure, unspecified: Secondary | ICD-10-CM | POA: Diagnosis not present

## 2019-10-10 DIAGNOSIS — I0981 Rheumatic heart failure: Secondary | ICD-10-CM | POA: Diagnosis not present

## 2019-10-10 DIAGNOSIS — I2510 Atherosclerotic heart disease of native coronary artery without angina pectoris: Secondary | ICD-10-CM | POA: Diagnosis not present

## 2019-10-10 DIAGNOSIS — I330 Acute and subacute infective endocarditis: Secondary | ICD-10-CM | POA: Diagnosis not present

## 2019-10-10 DIAGNOSIS — I480 Paroxysmal atrial fibrillation: Secondary | ICD-10-CM | POA: Diagnosis not present

## 2019-10-10 DIAGNOSIS — E119 Type 2 diabetes mellitus without complications: Secondary | ICD-10-CM | POA: Diagnosis not present

## 2019-10-10 DIAGNOSIS — B954 Other streptococcus as the cause of diseases classified elsewhere: Secondary | ICD-10-CM | POA: Diagnosis not present

## 2019-10-10 LAB — POCT INR: INR: 2.6 (ref 2.0–3.0)

## 2019-10-10 MED ORDER — GABAPENTIN 300 MG PO CAPS
300.0000 mg | ORAL_CAPSULE | Freq: Every day | ORAL | 5 refills | Status: AC
Start: 2019-10-10 — End: ?

## 2019-10-10 MED ORDER — VITAMIN D (ERGOCALCIFEROL) 1.25 MG (50000 UNIT) PO CAPS: 50000.0000 [IU] | ORAL_CAPSULE | ORAL | 0 refills | Status: AC

## 2019-10-10 MED ORDER — FE FUMARATE-B12-VIT C-FA-IFC PO CAPS: 1.0000 | ORAL_CAPSULE | Freq: Every day | ORAL | 0 refills | Status: AC
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
## 2019-10-10 NOTE — Telephone Encounter (Signed)
[facility], [facility] Nurse called. She was doing a home visit and wanted to let Dr. Stanford Breed know that the pt had some swelling in his L Leg (2+ pitting edema from below the knee down)   Pt c/o swelling: STAT is pt has developed SOB within 24 hours  1) How much weight have you gained and in what time span? Pt has actually lost weight since hospital d/c   2) If swelling, where is the swelling located? Below the knee  3) Are you currently taking a fluid pill? No, per HHN  4) Are you currently SOB? no  5) Do you have a log of your daily weights (if so, list)?   6) Have you gained 3 pounds in a day or 5 pounds in a week?   7) Have you traveled recently? no   Pt c/o BP issue: STAT if pt c/o blurred vision, one-sided weakness or slurred speech  1. What are your last 5 BP readings?  10/08/19: 142/92 10/10/19: 158/100  2. Are you having any other symptoms (ex. Dizziness, headache, blurred vision, passed out)? no  3. What is your BP issue? HHN wanted to report high BP    If the office would like to speak with [facility] she can be reached at [phone].

## 2019-10-10 NOTE — Patient Instructions (Addendum)
Description   Start taking 1 tablet daily except for 1.5 tablets on Tuesdays and Saturdays. Recheck INR on 5/24 by home health nurse. On amio,dose decreasing on 5/24. Call us with any new medications or concerns [phone].  Coumadin Clinic [phone].

## 2019-10-10 NOTE — Telephone Encounter (Signed)
Called the pt at home and left message to assess his symptoms verbally.

## 2019-10-10 NOTE — Telephone Encounter (Signed)
Beth calling with [name] and states that she has not heard anything back regarding [place]. Spoke with Verdis Frederickson and she states that she is going to send Dr Jenny Reichmann a message and get the ok

## 2019-10-11 ENCOUNTER — Other Ambulatory Visit: Payer: Self-pay

## 2019-10-11 ENCOUNTER — Telehealth: Payer: Self-pay

## 2019-10-11 ENCOUNTER — Ambulatory Visit (HOSPITAL_COMMUNITY)
Admission: RE | Admit: 2019-10-11 | Discharge: 2019-10-11 | Disposition: A | Discharge status: Home / Self Care | Payer: Medicare PPO | Source: Ambulatory Visit | Attending: Surgery | Admitting: Surgery

## 2019-10-11 ENCOUNTER — Other Ambulatory Visit: Payer: Self-pay | Admitting: Surgery

## 2019-10-11 DIAGNOSIS — I0981 Rheumatic heart failure: Secondary | ICD-10-CM | POA: Diagnosis not present

## 2019-10-11 DIAGNOSIS — I079 Rheumatic tricuspid valve disease, unspecified: Secondary | ICD-10-CM | POA: Diagnosis not present

## 2019-10-11 DIAGNOSIS — I82402 Acute embolism and thrombosis of unspecified deep veins of left lower extremity: Secondary | ICD-10-CM | POA: Insufficient documentation

## 2019-10-11 DIAGNOSIS — I330 Acute and subacute infective endocarditis: Secondary | ICD-10-CM | POA: Diagnosis not present

## 2019-10-11 DIAGNOSIS — I480 Paroxysmal atrial fibrillation: Secondary | ICD-10-CM | POA: Diagnosis not present

## 2019-10-11 DIAGNOSIS — I509 Heart failure, unspecified: Secondary | ICD-10-CM | POA: Diagnosis not present

## 2019-10-11 DIAGNOSIS — I700 Atherosclerosis of aorta: Secondary | ICD-10-CM | POA: Diagnosis not present

## 2019-10-11 DIAGNOSIS — Z48812 Encounter for surgical aftercare following surgery on the circulatory system: Secondary | ICD-10-CM | POA: Diagnosis not present

## 2019-10-11 DIAGNOSIS — I2510 Atherosclerotic heart disease of native coronary artery without angina pectoris: Secondary | ICD-10-CM | POA: Diagnosis not present

## 2019-10-11 DIAGNOSIS — B954 Other streptococcus as the cause of diseases classified elsewhere: Secondary | ICD-10-CM | POA: Diagnosis not present

## 2019-10-11 DIAGNOSIS — E119 Type 2 diabetes mellitus without complications: Secondary | ICD-10-CM | POA: Diagnosis not present

## 2019-10-11 NOTE — Telephone Encounter (Signed)
Spoke with [employer] and gave verbal OK for  nursing twice a week and once a week for seven weeks for [facility] care and IV ances, cardiac and diabetes management. Also was wondering about an order for Physical therapy evaluation.

## 2019-10-11 NOTE — Telephone Encounter (Signed)
Spoke with pt and his wife. Explained to them Dr. Gwynn Burly order's, along with them needing to contact the hospital due to improper discharge which is causing a lot of confusion for Dr. Jenny Reichmann and the pts care.

## 2019-10-11 NOTE — Progress Notes (Unsigned)
l °

## 2019-10-11 NOTE — Telephone Encounter (Signed)
Left message for pt to call.

## 2019-10-11 NOTE — Telephone Encounter (Signed)
Pt calling c/o left lower leg pain and swelling. Seems to be getting worse since hospital discharge, not currently taking any fluid pills. He is taking Coumadin  [name]-PA was notified and a Venous ultrasound was ordered at [facility] for today at 3:00 pm.  [name] will be notified on results by Parks Neptune -PA later today

## 2019-10-11 NOTE — Telephone Encounter (Signed)
New message   [facility] home health calling   The patient does not need any more physical therapy more independent at home.

## 2019-10-13 ENCOUNTER — Emergency Department (HOSPITAL_COMMUNITY)
Admission: EM | Admit: 2019-10-13 | Discharge: 2019-10-22 | Disposition: E | Payer: Medicare PPO | Attending: Emergency Medicine | Admitting: Emergency Medicine

## 2019-10-13 DIAGNOSIS — I5032 Chronic diastolic (congestive) heart failure: Secondary | ICD-10-CM | POA: Diagnosis not present

## 2019-10-13 DIAGNOSIS — Z79899 Other long term (current) drug therapy: Secondary | ICD-10-CM | POA: Insufficient documentation

## 2019-10-13 DIAGNOSIS — Z9889 Other specified postprocedural states: Secondary | ICD-10-CM | POA: Diagnosis not present

## 2019-10-13 DIAGNOSIS — Z951 Presence of aortocoronary bypass graft: Secondary | ICD-10-CM | POA: Insufficient documentation

## 2019-10-13 DIAGNOSIS — I2510 Atherosclerotic heart disease of native coronary artery without angina pectoris: Secondary | ICD-10-CM | POA: Insufficient documentation

## 2019-10-13 DIAGNOSIS — I110 Hypertensive heart disease with heart failure: Secondary | ICD-10-CM | POA: Diagnosis not present

## 2019-10-13 DIAGNOSIS — Z794 Long term (current) use of insulin: Secondary | ICD-10-CM | POA: Insufficient documentation

## 2019-10-13 DIAGNOSIS — E119 Type 2 diabetes mellitus without complications: Secondary | ICD-10-CM | POA: Diagnosis not present

## 2019-10-13 DIAGNOSIS — I469 Cardiac arrest, cause unspecified: Secondary | ICD-10-CM | POA: Insufficient documentation

## 2019-10-13 DIAGNOSIS — Z952 Presence of prosthetic heart valve: Secondary | ICD-10-CM | POA: Diagnosis not present

## 2019-10-13 MED ORDER — SODIUM BICARBONATE 8.4 % IV SOLN
INTRAVENOUS | Status: AC | PRN
  Administered 2019-10-13: 50 meq via INTRAVENOUS

## 2019-10-13 MED ORDER — EPINEPHRINE 1 MG/10ML IJ SOSY
PREFILLED_SYRINGE | INTRAMUSCULAR | Status: AC | PRN
  Administered 2019-10-13 (×3): 1 via INTRAVENOUS

## 2019-10-13 MED ORDER — SODIUM CHLORIDE 0.9 % IV SOLN
INTRAVENOUS | Status: AC | PRN
  Administered 2019-10-13: 1000 mL via INTRAVENOUS

## 2019-10-13 MED ORDER — CALCIUM CHLORIDE 10 % IV SOLN
INTRAVENOUS | Status: AC | PRN
  Administered 2019-10-13: 1 g via INTRAVENOUS

## 2019-10-14 ENCOUNTER — Other Ambulatory Visit: Payer: Self-pay | Admitting: Medical Oncology

## 2019-10-14 ENCOUNTER — Inpatient Hospital Stay: Payer: Medicare PPO | Admitting: Internal Medicine

## 2019-10-14 ENCOUNTER — Inpatient Hospital Stay: Payer: Medicare PPO

## 2019-10-14 DIAGNOSIS — I469 Cardiac arrest, cause unspecified: Secondary | ICD-10-CM | POA: Diagnosis not present

## 2019-10-14 MED FILL — Medication: Qty: 1 | Status: AC

## 2019-10-16 ENCOUNTER — Ambulatory Visit: Payer: Medicare PPO | Admitting: Surgery

## 2019-10-22 NOTE — ED Notes (Signed)
Ivin Booty, sister, [phone] would like a call back when available

## 2019-10-22 NOTE — ED Notes (Signed)
Legrand Como, son-in-law who is with daughter, [phone] would like an update when possible

## 2019-10-22 NOTE — ED Notes (Signed)
Pt does not meet requirements to be a ME case per MD Roxanne Mins. Pt removed of all lines and ETT.

## 2019-10-22 NOTE — Progress Notes (Signed)
Chaplain provided support to [name], her son, and daughter as they learned of [name] passing.  Chaplain provided the ministries of presence and listening as [name] recounted the events of today and contacted family.  [name] stated that her husband had survived a major surgery not long ago and was doing really well and that this was all unexpected.    Chaplain served as a Dentist between family and staff as they went to view [name].  Chaplain also gave family a patient placement card and collected [name]'s contact information.  Chaplain is available to follow up as needed.

## 2019-10-22 NOTE — ED Notes (Signed)
Chaplain paged to [employer] per her request

## 2019-10-22 NOTE — ED Triage Notes (Addendum)
Patient from home, wife heard him fall in bathroom and found him unresponsive.  Family started CPR PTA of EMS.  Patient was found to be in PEA rhythm upon EMS arrival, rate of 50.  After 10 mins of CPR, ROSC was obtained.  9 mins later, patient lost pulses and CPR in progress upon arrival to ED.  Patient with 30 mins of CPR upon arrival to ED.  Patient was just discharged from [facility] on 5/17 after valve replacement.  King airway, endtidal of 45, CBG of 155.  6 epis given en route via EMS.

## 2019-10-22 NOTE — Telephone Encounter (Signed)
See ER notes.

## 2019-10-22 NOTE — ED Provider Notes (Addendum)
[facility] EMERGENCY DEPARTMENT Provider Note   CSN: [id number] Arrival date & time: 10/09/2019  2344   History No chief complaint on file.   [name] is a 54 y.o. male.  The history is provided by the EMS personnel. The history is limited by the condition of the patient (CPR in progress).  He has history of hypertension, hyperlipidemia, paroxysmal atrial fibrillation, status post valve replacement surgery having been discharged about 1 week ago.  He comes in with CPR in progress.  There was a witnessed arrest at home with bystander CPR initiated.  EMS reports pulseless electrical activity but they did have return of spontaneous circulation after 2 epinephrine injections with blood pressure 78 systolic.  However, they quickly lost pulses and have administered 2 additional epinephrine doses.  He has been maintained in pulseless electrical activity.  CPR had been going on for 53mnutes by the time he arrived in the emergency department.  Past Medical History:  Diagnosis Date  . Ascending aortic dissection (HElrosa    a. 2000 - with aortic valve involvement. S/p repair of aortic dissection with placement of mechanical AVR.  .Marland KitchenCAD (coronary artery disease)    a. 2000 VG->PDA @ time of Ao dissection repair;  b. Cardiac CT 06/2010: no CAD, only mild plaque in prox RCA;  c. 2015 Cath: LM nl, LAD nl, LCX nl, RCA 100, VG->PDA 90 (4.5x12 Rebel BMS). d. 10/23/17 instent restenosis SVG to RCA, DES placed.  . Cholelithiasis 08/22/2013  . Chronic diastolic CHF (congestive heart failure) (HMiltonv[place]    a. 03/2016 Echo: EF 55-60%, no rwma, Gr2 DD.  . Diabetes mellitus   . Dyspnea   . Gross hematuria   . H/O mechanical aortic valve replacement    a. 03/2016 Echo: AoV mean gradient 165mg, Valve Area (VTI) 1.36 cm^2, (Vmax) 1.22 cm^2, mod dil LA.  . Marland Kitchenyperlipidemia   . Hypertension   . NEPHROLITHIASIS, HX OF   . Obesity   . PAF (paroxysmal atrial fibrillation) (HCLong [place]   a. H/o such, with  recurrence of coarse afib/flutter during ER consult 03/2012.  . Stroke (HCAlsen  . TRANSIENT ISCHEMIC ATTACK, HX OF    a. In 2004.  . Marland Kitchennspecified vitamin D deficiency     Patient Active Problem List   Diagnosis Date Noted  . Closed fracture of T10 vertebra (HCSpringbrook  . Endocarditis 09/26/2019  . Plasmacytoma (HCFawn Lake Forest05/07/2019  . Streptococcal bacteremia 09/10/2019  . Endocarditis of tricuspid valve 09/10/2019  . Suspected endocarditis mechanical aortic valve 09/10/2019  . Nausea vomiting and diarrhea   . Chronic anticoagulation 04/02/2019  . Wellness examination 09/28/2018  . Lumbar disc herniation 06/06/2018  . CAD (coronary artery disease)   . Low back pain 03/07/2018  . Obesity (BMI 30-39.9) 01/15/2018  . Acute on chronic systolic congestive heart failure (HCMaricopa  . Status post coronary artery stent placement   . Shortness of breath 10/22/2017  . Normocytic anemia 08/31/2017  . Chronic combined systolic and diastolic congestive heart failure (HCBishop11/12/2015  . Screen for colon cancer 09/03/2015  . PTSD (post-traumatic stress disorder) 10/31/2013  . Depressive disorder 10/31/2013  . Hallucinations 10/31/2013  . Cholelithiasis 08/22/2013  . Hypothyroidism 08/22/2013  . Encounter for therapeutic drug monitoring 08/07/2013  . Unstable angina (HCKearns03/09/2013  . CAD of artery bypass graft-  07/24/2013  . Hematoma of groin 07/24/2013  . Retroperitoneal bleed Nov 2014 07/24/2013  . Acute diastolic heart failure (HCPine Harbor03/05/2013  . Ascending aortic dissection  in 2000- s/p [place] and MDT tilting disc AVR   . PAF (paroxysmal atrial fibrillation) ([place])   . Chest tube in place 03/24/2013  . Bladder neck obstruction 05/04/2012  . Unspecified vitamin D deficiency 08/07/2008  . Gross hematuria 08/07/2008  . FATIGUE 12/26/2007  . Hyperlipidemia 08/20/2007  . Diabetes mellitus type II, non insulin dependent ([place]) 05/21/2007  . Essential hypertension 05/21/2007  . NEPHROLITHIASIS, HX OF  05/21/2007    Past Surgical History:  Procedure Laterality Date  . AORTIC VALVE REPLACEMENT  2000  . APPENDECTOMY  1998  . ASCENDING AORTIC ROOT REPLACEMENT N/A 09/26/2019   Procedure: REDO ASCENDING AORTIC ROOT REPLACEMENT USING LIFENET 25 MM CRYO, AORTIC VALVE HOMOGRAFT AND 30 MM HEMASHIELD PLATINUM GRAFT;  Surgeon: Gaye Pollack, MD;  Location: [place];  Service: Open Heart Surgery;  Laterality: N/A;  CIRC ARREST  . CORONARY ARTERY BYPASS GRAFT  2000  . CORONARY ARTERY BYPASS GRAFT N/A 09/26/2019   Procedure: CORONARY ARTERY BYPASS GRAFTING (CABG), ON PUMP, TIMES THREE, USING ENDOSCOPICALLY HARVESTED LEFT GREATER SAPHENOUS VEIN;  Surgeon: Gaye Pollack, MD;  Location: [place];  Service: Open Heart Surgery;  Laterality: N/A;  WITH EVH  . CORONARY STENT INTERVENTION N/A 10/23/2017   Procedure: CORONARY STENT INTERVENTION;  Surgeon: [name], MD;  Location: [place] CV LAB;  Service: Cardiovascular;  Laterality: N/A;  . IR FLUORO GUIDED NEEDLE PLC ASPIRATION/INJECTION LOC  09/18/2019  . LEFT AND RIGHT HEART CATHETERIZATION WITH CORONARY ANGIOGRAM N/A 07/19/2013   Procedure: LEFT AND RIGHT HEART CATHETERIZATION WITH CORONARY ANGIOGRAM;  Surgeon: [name], MD;  Location: [facility] CATH LAB;  Service: Cardiovascular;  Laterality: N/A;  . LUMBAR LAMINECTOMY/DECOMPRESSION MICRODISCECTOMY Right 06/06/2018   Procedure: Right L3-4 disectomy;  Surgeon: Melina Schools, MD;  Location: [place];  Service: Orthopedics;  Laterality: Right;  2.5 hrs  . MULTIPLE EXTRACTIONS WITH ALVEOLOPLASTY Bilateral 09/17/2019   Procedure: EXTRACTION OF TOOTH #'S 1,96,22,29,79 WITH ALVEOLOPLASTY AND GROSS DEBRIDEMENT OF REMAINING TEETH;  Surgeon: Lenn Cal, DDS;  Location: [place];  Service: Oral Surgery;  Laterality: Bilateral;  . RIGHT HEART CATH AND CORONARY/GRAFT ANGIOGRAPHY N/A 10/23/2017   Procedure: RIGHT HEART CATH AND CORONARY/GRAFT ANGIOGRAPHY;  Surgeon: [name], MD;  Location: [place]  CV LAB;  Service: Cardiovascular;  Laterality: N/A;  . RIGHT HEART CATH AND CORONARY/GRAFT ANGIOGRAPHY N/A 09/16/2019   Procedure: RIGHT HEART CATH AND CORONARY/GRAFT ANGIOGRAPHY;  Surgeon: Martinique, Peter M, MD;  Location: [place] CV LAB;  Service: Cardiovascular;  Laterality: N/A;  . TEE WITHOUT CARDIOVERSION N/A 07/22/2013   Procedure: TRANSESOPHAGEAL ECHOCARDIOGRAM (TEE);  Surgeon: [name], MD;  Location: [place];  Service: Cardiovascular;  Laterality: N/A;  . TEE WITHOUT CARDIOVERSION N/A 09/11/2019   Procedure: TRANSESOPHAGEAL ECHOCARDIOGRAM (TEE);  Surgeon: Sanda Klein, MD;  Location: [place];  Service: Cardiovascular;  Laterality: N/A;  . TEE WITHOUT CARDIOVERSION N/A 09/26/2019   Procedure: TRANSESOPHAGEAL ECHOCARDIOGRAM (TEE);  Surgeon: Gaye Pollack, MD;  Location: [place];  Service: Open Heart Surgery;  Laterality: N/A;       Family History  Problem Relation Age of Onset  . Coronary artery disease Mother   . Hypertension Mother   . Diabetes Brother   . Coronary artery disease Other 86       male 1st degree relative, CABG in his 27's (father)  . Cancer Father        ? lung    Social History   Tobacco Use  . Smoking status: Never  Smoker  . Smokeless tobacco: Never Used  Substance Use Topics  . Alcohol use: No    Alcohol/week: 0.0 standard drinks  . Drug use: No    Home Medications Prior to Admission medications   Medication Sig Start Date End Date Taking? Authorizing Provider  amiodarone (PACERONE) 200 MG tablet Take 1 tablet (200 mg total) by mouth 2 (two) times daily. For one week then take 200 mg daily thereafter 10/07/19   Nani Skillern, PA-C  aspirin EC 81 MG tablet Take 1 tablet (81 mg total) by mouth daily as needed (headache). 10/04/19   Nani Skillern, PA-C  atorvastatin (LIPITOR) 40 MG tablet TAKE 1 TABLET EVERY DAY  AT  6  PM Patient taking differently: Take 40 mg by mouth daily with breakfast.  08/26/19   Biagio Borg, MD    BD VEO INSULIN SYRINGE U/F 31G X 15/64" 0.3 ML MISC  10/07/19   [provider]  blood glucose meter kit and supplies KIT Dispense based on patient and insurance preference. Use up to four times daily as directed. (FOR ICD-9 250.00, 250.01). 10/07/19   Nani Skillern, PA-C  Blood Glucose Monitoring Suppl (TRUE METRIX AIR GLUCOSE METER) DEVI 1 Device by Does not apply route daily. E11.9 08/26/19   Biagio Borg, MD  cefTRIAXone (ROCEPHIN) IVPB Inject 2 g into the vein daily. Indication:  Streptococcus infantaris endocarditis First Dose: Yes Last Day of Therapy:  11/07/2019 Labs - Once weekly:  CBC/D and BMP, Labs - Every other week:  ESR and CRP Method of administration: IV Push Method of administration may be changed at the discretion of home infusion pharmacist based upon assessment of the patient and/or caregiver's ability to self-administer the medication ordered. 10/07/19 11/12/19  Nani Skillern, PA-C  cholecalciferol (VITAMIN D) 1000 UNITS tablet Take 1,000 Units by mouth daily with breakfast.     [provider]  ferrous [id number] C-folic acid (TRINSICON / FOLTRIN) capsule Take 1 capsule by mouth daily. For one month then stop. If develops constipation, may stop sooner 10/10/19   Biagio Borg, MD  gabapentin (NEURONTIN) 300 MG capsule Take 1 capsule (300 mg total) by mouth daily with breakfast. 10/10/19   Biagio Borg, MD  glucose blood (TRUE METRIX BLOOD GLUCOSE TEST) test strip Use as directed once daily 08/26/19   Biagio Borg, MD  guaiFENesin (MUCINEX) 600 MG 12 hr tablet Take 1 tablet (600 mg total) by mouth 2 (two) times daily as needed for cough or to loosen phlegm. 10/07/19   Nani Skillern, PA-C  insulin detemir (LEVEMIR) 100 UNIT/ML injection Inject 0.18 mLs (18 Units total) into the skin daily. 10/07/19   Nani Skillern, PA-C  levothyroxine (SYNTHROID) 50 MCG tablet TAKE 1 TABLET EVERY DAY Patient taking differently: Take 50 mcg by  mouth daily with breakfast.  06/07/19   Biagio Borg, MD  metoprolol succinate (TOPROL XL) 50 MG 24 hr tablet Take 1 tablet (50 mg total) by mouth daily. Take with or immediately following a meal. 10/07/19 10/06/20  Lars Pinks M, PA-C  traMADol (ULTRAM) 50 MG tablet Take 1 tablet (50 mg total) by mouth every 12 (twelve) hours as needed for moderate pain. 10/07/19   Nani Skillern, PA-C  Vitamin D, Ergocalciferol, (DRISDOL) 1.25 MG (50000 UNIT) CAPS capsule Take 1 capsule (50,000 Units total) by mouth every Tuesday. 10/15/19   Biagio Borg, MD  warfarin (COUMADIN) 5 MG tablet TAKE 1 TO 1 AND 1/2  TABLETS EVERY DAY AS DIRECTED BY COUMADIN CLINIC Patient taking differently: Take 5-7.5 mg by mouth See admin instructions. Take 1 tablet (5 mg) by mouth on Monday, Wednesday, Friday with supper, take 1 1/2 tablets (7.5 mg) on Sunday, Tuesday, August 21, 2022, Saturday with supper - or as directed by coumadin clinic 08/28/19   Lelon Perla, MD  enoxaparin (LOVENOX) 120 MG/0.8ML injection Inject 0.8 mLs (120 mg total) into the skin every 12 (twelve) hours. 06/19/18 01/09/19  Lelon Perla, MD    Allergies    Diltiazem and Insulin glargine  Review of Systems   Review of Systems  Unable to perform ROS: Patient unresponsive    Physical Exam Updated Vital Signs There were no vitals taken for this visit.  Physical Exam Vitals and nursing note reviewed.   54 year old male with CPR in progress. Head is normocephalic and atraumatic.  Pupils midposition and fixed.  King airway in place. Neck is without adenopathy JVD. Lungs have symmetric airflow. Chest: Sternotomy scar is present and appears to be healing appropriately. Heart tones are absent. Abdomen is soft. Extremities: Intraosseous line present left tibia with moderate edema of the left leg. Skin is warm and dry without rash. Neurologic: GCS equals 3.  No spontaneous movement or respirations.  ED Results / Procedures / Treatments     Procedures Procedure Name: Intubation Date/Time: 09/30/2019 11:55 PM Performed by: Delora Fuel, MD Pre-anesthesia Checklist: Patient identified, Emergency Drugs available, Suction available, Patient being monitored and Timeout performed Oxygen Delivery Method: Ambu bag Preoxygenation: Pre-oxygenation with 100% oxygen Laryngoscope Size: Glidescope and 4 Grade View: Grade I Tube size: 8.0 mm Number of attempts: 1 Airway Equipment and Method: Rigid stylet and Video-laryngoscopy Placement Confirmation: ETT inserted through vocal cords under direct vision,  Positive ETCO2 and Breath sounds checked- equal and bilateral Secured at: 24 cm Tube secured with: ETT holder Dental Injury: Teeth and Oropharynx as per pre-operative assessment        Cardiopulmonary Resuscitation (CPR) Procedure Note Directed/Performed by: Delora Fuel I personally directed ancillary staff and/or performed CPR in an effort to regain return of spontaneous circulation and to maintain cardiac, neuro and systemic perfusion.    Medications Ordered in ED Medications  EPINEPHrine (ADRENALIN) 1 MG/10ML injection (1 Syringe Intravenous Given 10/18/2019 2352)  0.9 %  sodium chloride infusion ( Intravenous Stopped 10/01/2019 2353)  sodium bicarbonate injection (50 mEq Intravenous Given 10/03/2019 2351)  calcium chloride injection (1 g Intravenous Given 10/01/2019 2350)    ED Course  I have reviewed the triage vital signs and the nursing notes.  MDM Rules/Calculators/A&P Patient patient with CPR in progress, status post recent cardiac surgery.  He arrived in pulseless electrical activity.  King airway was in place and was removed endotracheal tube inserted.  Because of recent cardiac surgery, there was concern for cardiac tamponade and limited bedside ultrasound was done showing no obvious pericardial effusion, exam limited by body habitus.  There was slight cardiac movement.  He was given additional epinephrine as well as calcium and  sodium bicarbonate without any change in status.  He was felt that demonstrated cardiac unresponsiveness and was pronounced dead at 21-Aug-2351.  Old records were reviewed and he is noted to have actually had a very complex history with endocarditis of a prosthetic aortic valve and recent surgery actually included replacement of an aortic root graft.  Case was discussed with Izora Ribas, medical examiner who declines the case as it is clearly a medical death.  Final Clinical Impression(s) /  ED Diagnoses Final diagnoses:  Cardiopulmonary arrest [facility])  Pulseless electrical activity [facility])    Rx / DC Orders ED Discharge Orders    None       Delora Fuel, MD 03/79/44 0025  Family notified of events.   Delora Fuel, MD 46/19/01 [phone]

## 2019-10-22 DEATH — deceased

## 2019-10-23 ENCOUNTER — Inpatient Hospital Stay: Payer: Medicare PPO | Admitting: Infectious Disease

## 2019-10-24 ENCOUNTER — Inpatient Hospital Stay: Payer: Medicare PPO | Admitting: Infectious Disease

## 2019-10-25 ENCOUNTER — Ambulatory Visit: Payer: Medicare PPO | Admitting: Medical

## 2019-10-30 ENCOUNTER — Ambulatory Visit: Payer: Medicare PPO | Admitting: Surgery

## 2019-11-26 ENCOUNTER — Ambulatory Visit: Payer: Medicare PPO | Admitting: Internal Medicine
